# Patient Record
Sex: Female | Born: 1960
Health system: Southern US, Community
[De-identification: ages and names within clinical notes are randomized; demographics above are authoritative.]

## PROBLEM LIST (undated history)

## (undated) DIAGNOSIS — Z789 Other specified health status: Secondary | ICD-10-CM

## (undated) DIAGNOSIS — R7989 Other specified abnormal findings of blood chemistry: Secondary | ICD-10-CM

## (undated) DIAGNOSIS — M869 Osteomyelitis, unspecified: Secondary | ICD-10-CM

## (undated) DIAGNOSIS — I10 Essential (primary) hypertension: Secondary | ICD-10-CM

## (undated) DIAGNOSIS — C541 Malignant neoplasm of endometrium: Secondary | ICD-10-CM

## (undated) DIAGNOSIS — D649 Anemia, unspecified: Secondary | ICD-10-CM

## (undated) DIAGNOSIS — J189 Pneumonia, unspecified organism: Secondary | ICD-10-CM

## (undated) DIAGNOSIS — K219 Gastro-esophageal reflux disease without esophagitis: Secondary | ICD-10-CM

## (undated) DIAGNOSIS — M25539 Pain in unspecified wrist: Secondary | ICD-10-CM

## (undated) DIAGNOSIS — H8110 Benign paroxysmal vertigo, unspecified ear: Secondary | ICD-10-CM

## (undated) DIAGNOSIS — Z923 Personal history of irradiation: Secondary | ICD-10-CM

## (undated) DIAGNOSIS — M25522 Pain in left elbow: Secondary | ICD-10-CM

## (undated) DIAGNOSIS — M25512 Pain in left shoulder: Secondary | ICD-10-CM

## (undated) DIAGNOSIS — J45909 Unspecified asthma, uncomplicated: Secondary | ICD-10-CM

## (undated) HISTORY — PX: ABDOMINAL HYSTERECTOMY: SHX81

## (undated) HISTORY — PX: CARDIAC CATHETERIZATION: SHX172

## (undated) HISTORY — DX: Other specified health status: Z78.9

## (undated) HISTORY — DX: Unspecified asthma, uncomplicated: J45.909

## (undated) HISTORY — PX: TUBAL LIGATION: SHX77

---
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
## 1988-11-28 DIAGNOSIS — E1169 Type 2 diabetes mellitus with other specified complication: Secondary | ICD-10-CM | POA: Insufficient documentation

## 1988-11-28 DIAGNOSIS — E1140 Type 2 diabetes mellitus with diabetic neuropathy, unspecified: Secondary | ICD-10-CM | POA: Insufficient documentation

## 1988-11-28 DIAGNOSIS — E119 Type 2 diabetes mellitus without complications: Secondary | ICD-10-CM | POA: Insufficient documentation

## 1988-11-28 DIAGNOSIS — M869 Osteomyelitis, unspecified: Secondary | ICD-10-CM

## 2008-11-28 DIAGNOSIS — E1159 Type 2 diabetes mellitus with other circulatory complications: Secondary | ICD-10-CM

## 2008-11-28 DIAGNOSIS — I10 Essential (primary) hypertension: Secondary | ICD-10-CM | POA: Insufficient documentation

## 2008-11-28 DIAGNOSIS — I152 Hypertension secondary to endocrine disorders: Secondary | ICD-10-CM | POA: Insufficient documentation

## 2010-09-30 ENCOUNTER — Encounter (INDEPENDENT_AMBULATORY_CARE_PROVIDER_SITE_OTHER): Payer: Self-pay | Admitting: Family Medicine

## 2010-09-30 ENCOUNTER — Inpatient Hospital Stay (HOSPITAL_COMMUNITY)
Admission: EM | Admit: 2010-09-30 | Discharge: 2010-10-04 | Payer: Self-pay | Source: Home / Self Care | Admitting: Emergency Medicine

## 2010-10-01 ENCOUNTER — Ambulatory Visit: Payer: Self-pay | Admitting: Infectious Diseases

## 2010-10-01 LAB — CONVERTED CEMR LAB: Hgb A1c MFr Bld: 12.5 %

## 2010-10-03 LAB — CONVERTED CEMR LAB
BUN: 6 mg/dL
CO2: 27 meq/L
Calcium: 8.8 mg/dL
Chloride: 108 meq/L
Creatinine, Ser: 0.47 mg/dL
Glucose, Bld: 324 mg/dL
Potassium: 4.1 meq/L
Sodium: 140 meq/L

## 2010-10-04 LAB — CONVERTED CEMR LAB
HCT: 34.7 %
Hemoglobin: 11 g/dL
MCHC: 31.7 g/dL
MCV: 91.6 fL
Platelets: 258 10*3/uL
RBC: 3.79 M/uL
RDW: 15.9 %
WBC: 4.1 10*3/uL

## 2010-10-26 ENCOUNTER — Emergency Department (HOSPITAL_COMMUNITY)
Admission: EM | Admit: 2010-10-26 | Discharge: 2010-10-26 | Payer: Self-pay | Source: Home / Self Care | Admitting: Emergency Medicine

## 2010-10-29 ENCOUNTER — Emergency Department (HOSPITAL_COMMUNITY)
Admission: EM | Admit: 2010-10-29 | Discharge: 2010-10-29 | Payer: Self-pay | Source: Home / Self Care | Admitting: Emergency Medicine

## 2010-11-01 ENCOUNTER — Telehealth (INDEPENDENT_AMBULATORY_CARE_PROVIDER_SITE_OTHER): Payer: Self-pay | Admitting: Nurse Practitioner

## 2010-11-01 ENCOUNTER — Ambulatory Visit: Payer: Self-pay | Admitting: Internal Medicine

## 2010-11-01 ENCOUNTER — Encounter (INDEPENDENT_AMBULATORY_CARE_PROVIDER_SITE_OTHER): Payer: Self-pay | Admitting: Nurse Practitioner

## 2010-11-01 DIAGNOSIS — E1140 Type 2 diabetes mellitus with diabetic neuropathy, unspecified: Secondary | ICD-10-CM

## 2010-11-01 DIAGNOSIS — J45909 Unspecified asthma, uncomplicated: Secondary | ICD-10-CM | POA: Insufficient documentation

## 2010-11-01 DIAGNOSIS — L97509 Non-pressure chronic ulcer of other part of unspecified foot with unspecified severity: Secondary | ICD-10-CM | POA: Insufficient documentation

## 2010-11-01 HISTORY — DX: Unspecified asthma, uncomplicated: J45.909

## 2010-11-01 HISTORY — DX: Type 2 diabetes mellitus with diabetic neuropathy, unspecified: E11.40

## 2010-11-01 LAB — CONVERTED CEMR LAB
Bilirubin Urine: NEGATIVE
Blood Glucose, Fingerstick: 284
Glucose, Urine, Semiquant: >=1000
Microalb, Ur: 3.23 mg/dL — ABNORMAL HIGH (ref 0.00–1.89)
Nitrite: NEGATIVE
Protein, U semiquant: NEGATIVE
Specific Gravity, Urine: 1.02
Urobilinogen, UA: 0.2
WBC Urine, dipstick: NEGATIVE
pH: 5

## 2010-11-09 ENCOUNTER — Encounter (HOSPITAL_BASED_OUTPATIENT_CLINIC_OR_DEPARTMENT_OTHER)
Admission: RE | Admit: 2010-11-09 | Discharge: 2010-12-28 | Payer: Self-pay | Source: Home / Self Care | Attending: Internal Medicine | Admitting: Internal Medicine

## 2010-11-17 ENCOUNTER — Encounter (INDEPENDENT_AMBULATORY_CARE_PROVIDER_SITE_OTHER): Payer: Self-pay | Admitting: Nurse Practitioner

## 2010-11-18 ENCOUNTER — Encounter (INDEPENDENT_AMBULATORY_CARE_PROVIDER_SITE_OTHER): Payer: Self-pay | Admitting: Nurse Practitioner

## 2010-12-01 ENCOUNTER — Ambulatory Visit (HOSPITAL_COMMUNITY)
Admission: RE | Admit: 2010-12-01 | Discharge: 2010-12-01 | Payer: Self-pay | Source: Home / Self Care | Attending: Internal Medicine | Admitting: Internal Medicine

## 2010-12-02 ENCOUNTER — Telehealth (INDEPENDENT_AMBULATORY_CARE_PROVIDER_SITE_OTHER): Payer: Self-pay | Admitting: Nurse Practitioner

## 2010-12-03 ENCOUNTER — Ambulatory Visit
Admission: RE | Admit: 2010-12-03 | Discharge: 2010-12-03 | Payer: Self-pay | Source: Home / Self Care | Attending: Nurse Practitioner | Admitting: Nurse Practitioner

## 2010-12-03 ENCOUNTER — Encounter (INDEPENDENT_AMBULATORY_CARE_PROVIDER_SITE_OTHER): Payer: Self-pay | Admitting: Nurse Practitioner

## 2010-12-07 ENCOUNTER — Ambulatory Visit: Admit: 2010-12-07 | Payer: Self-pay | Admitting: Nurse Practitioner

## 2010-12-13 LAB — GLUCOSE, CAPILLARY: Glucose-Capillary: 184 mg/dL — ABNORMAL HIGH (ref 70–99)

## 2010-12-15 LAB — GLUCOSE, CAPILLARY
Glucose-Capillary: 155 mg/dL — ABNORMAL HIGH (ref 70–99)
Glucose-Capillary: 124 mg/dL — ABNORMAL HIGH (ref 70–99)
Glucose-Capillary: 126 mg/dL — ABNORMAL HIGH (ref 70–99)
Glucose-Capillary: 141 mg/dL — ABNORMAL HIGH (ref 70–99)
Glucose-Capillary: 121 mg/dL — ABNORMAL HIGH (ref 70–99)
Glucose-Capillary: 113 mg/dL — ABNORMAL HIGH (ref 70–99)

## 2010-12-20 LAB — GLUCOSE, CAPILLARY
Glucose-Capillary: 240 mg/dL — ABNORMAL HIGH (ref 70–99)
Glucose-Capillary: 154 mg/dL — ABNORMAL HIGH (ref 70–99)
Glucose-Capillary: 324 mg/dL — ABNORMAL HIGH (ref 70–99)
Glucose-Capillary: 194 mg/dL — ABNORMAL HIGH (ref 70–99)
Glucose-Capillary: 229 mg/dL — ABNORMAL HIGH (ref 70–99)
Glucose-Capillary: 186 mg/dL — ABNORMAL HIGH (ref 70–99)

## 2010-12-21 LAB — GLUCOSE, CAPILLARY
Glucose-Capillary: 220 mg/dL — ABNORMAL HIGH (ref 70–99)
Glucose-Capillary: 128 mg/dL — ABNORMAL HIGH (ref 70–99)

## 2010-12-22 LAB — GLUCOSE, CAPILLARY
Glucose-Capillary: 147 mg/dL — ABNORMAL HIGH (ref 70–99)
Glucose-Capillary: 151 mg/dL — ABNORMAL HIGH (ref 70–99)

## 2010-12-23 LAB — GLUCOSE, CAPILLARY
Glucose-Capillary: 177 mg/dL — ABNORMAL HIGH (ref 70–99)
Glucose-Capillary: 90 mg/dL (ref 70–99)

## 2010-12-28 LAB — GLUCOSE, CAPILLARY
Glucose-Capillary: 49 mg/dL — ABNORMAL LOW (ref 70–99)
Glucose-Capillary: 67 mg/dL — ABNORMAL LOW (ref 70–99)

## 2010-12-28 NOTE — Progress Notes (Signed)
Summary: Wound Center Referral  Phone Note Outgoing Call   Summary of Call: refer to pt wound clinic reason: left great toe ulcer s/p hospitalization  Hx of diabetes Initial call taken by: Lehman Prom FNP,  November 01, 2010 4:37 PM  Follow-up for Phone Call        PT HAVE AN APPT WOUND CARE CENTER  11-09-10 @ 9AM PT AWARE OF HER APPT  Follow-up by: Cheryll Dessert,  November 03, 2010 11:23 AM

## 2010-12-28 NOTE — Assessment & Plan Note (Signed)
Summary: NEW - Hospital F/u   Vital Signs:  Patient profile:   50 year old female LMP:     10/30/2010 Height:      73 inches Weight:      248.9 pounds BMI:     32.96 BSA:     2.36 Temp:     98.1 degrees F oral Pulse rate:   88 / minute Pulse rhythm:   regular Resp:     20 per minute BP sitting:   100 / 60  (left arm) Cuff size:   regular  Vitals Entered By: Levon Hedger (November 01, 2010 3:25 PM)  Nutrition Counseling: Patient's BMI is greater than 25 and therefore counseled on weight management options. CC: follow-up visit [facility], Hypertension Management Is Patient Diabetic? Yes Pain Assessment Patient in pain? no      CBG Result 284 CBG Device ID B  Does patient need assistance? Functional Status Self care Ambulation Normal LMP (date): 10/30/2010     Enter LMP: 10/30/2010   CC:  follow-up visit [facility] and Hypertension Management.  History of Present Illness:  Pt into the office to establish care. Pt is from [place].  She has been in [place] for 7 months.   NO PCP since she has been in [place].    Pt was recently in the hospital for a toe infection. She was hospitalized from 09/30/2010 to 10/04/2010 (full d/c reviewed)  Diabetic left foot great toe infection with wound culture results showing moderate gram negative rods Left great toe - dressing changes daily per pt as per directions given in hospital. MRI of left foot shows forefoot cellulitis with suspected subungual infection in the great toe.  In addition there was superficial blister dorsal lateral to the interphalangeal joints of the great toe which may be infected.  No deep fluid identified. pt was followed by Dr. Ninetta Lights and Orvan Falconer of Infections disease and she was also consulted by Dr. Annell Greening of orthopedics.  1 week following her hospitalization she started with boils. She went to the ER and boil on buttock was lanced.  She was started with doxycycline and pain medications.  Areas have started  to improve  Diabetes - Pt completed her supply in 07/2010 and since she did not establish with a PCP she was not able to afford nor get Rx for the medications Novolin 70/30 - 45 in A.M.and 30 units in PM Lantus - 35 at night Metformin 1000mg  by mouth two times a day  Even after pt was d/c from the hospital she was not able to get the refills on insulin due to cost.  Hypertension History:      She denies headache, chest pain, and palpitations.        Positive major cardiovascular risk factors include diabetes and hypertension.  Negative major cardiovascular risk factors include female age less than 60 years old and non-tobacco-user status.     Habits & Providers  Alcohol-Tobacco-Diet     Alcohol drinks/day: 0     Tobacco Status: never  Exercise-Depression-Behavior     Drug Use: never  Medications Prior to Update: 1)  None  Allergies (verified): 1)  ! Penicillin 2)  ! Ibuprofen  Past History:  Past Surgical History: Caesarean section x 4  Family History: mother - htn father - diabetes, CAD, CVA, alcholism (deceased) 2 brothers (1 younger brother with diabetes) 2 sisters - (1 younger sister with htn)  Social History: G5P4 4 C-section separated Tobacco - none ETOH -  none Drug - noneSmoking Status:  never Drug Use:  never  Review of Systems CV:  Denies chest pain or discomfort. Resp:  Denies cough. GI:  Denies abdominal pain, nausea, and vomiting. Derm:  Complains of poor wound healing; left great toe.  Physical Exam  General:  alert.   Head:  normocephalic.   Lungs:  normal breath sounds.   Heart:  normal rate and regular rhythm.   Abdomen:  normal bowel sounds.   Msk:  left great toe - missing toenail granulation tissue in nail bed  yellow, thick crusted skin at head of toe Neurologic:  alert & oriented X3.    Diabetes Management Exam:    Foot Exam (with socks and/or shoes not present):       Sensory-Monofilament:          Left foot: normal           Right foot: normal  Diabetic Foot Exam Foot Inspection Is there a history of a foot ulcer?              Yes Is there a foot ulcer now?              Yes Can the patient see the bottom of their feet?          No Are the shoes appropriate in style and fit?          Yes Is there swelling or an abnormal foot shape?          No Are the toenails thick?                Yes Are the toenails ingrown?              No Is there heavy callous build-up?              Yes Is there pain in the calf muscle (Intermittent claudication) when walking?    NoIs there a claw toe deformity?              Yes Is there elevated skin temperature?            No Is there limited ankle dorsiflexion?            No Is there foot or ankle muscle weakness?            No  Diabetic Foot Care Education Patient educated on appropriate care of diabetic feet.  Pulse Check          Right Foot          Left Foot Dorsalis Pedis:        normal            normal  High Risk Feet? Yes   10-g (5.07) Semmes-Weinstein Monofilament Test Performed by: Levon Hedger          Right Foot          Left Foot Visual Inspection                 Impression & Recommendations:  Problem # 1:  DIABETES MELLITUS, TYPE II (ICD-250.00) will restart insulin Hgba1c = 12.5 while in the hospital Her updated medication list for this problem includes:    Benazepril Hcl 10 Mg Tabs (Benazepril hcl) ..... One tablet by mouth daily for blood pressure    Novolog Mix 70/30 70-30 % Susp (Insulin aspart prot & aspart) .Marland KitchenMarland KitchenMarland KitchenMarland Kitchen 45 units subcutaneously in the morning and 30 unis subcutaneously in the afternoon for  diabetes    Lantus 100 Unit/ml Soln (Insulin glargine) .Marland KitchenMarland KitchenMarland KitchenMarland Kitchen 35 units subcutaneously nightly for diabetes    Metformin Hcl 1000 Mg Tabs (Metformin hcl) ..... One tablet by mouth two times a day for diabetes  Orders: Capillary Blood Glucose/CBG (04540) UA Dipstick w/o Micro (manual) (98119) T-Urine Microalbumin w/creat. ratio  [phone])  Problem # 2:  HYPERTENSION, BENIGN ESSENTIAL (ICD-401.1) BP stable continue current meds Her updated medication list for this problem includes:    Benazepril Hcl 10 Mg Tabs (Benazepril hcl) ..... 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 One tablet by mouth daily for blood pressure    Novolog Mix 70/30 70-30 % Susp (Insulin aspart prot & aspart) .Marland KitchenMarland KitchenMarland KitchenMarland Kitchen 45 units subcutaneously in the morning and 30 unis subcutaneously in the afternoon for diabetes    Lantus 100 Unit/ml Soln (Insulin glargine) .Marland KitchenMarland KitchenMarland KitchenMarland Kitchen 35 units subcutaneously nightly for diabetes    Metformin Hcl 1000 Mg Tabs (Metformin hcl) ..... One tablet by mouth two times a day for diabetes  Complete Medication List: 1)  Benazepril Hcl 10 Mg Tabs (Benazepril hcl) .... One tablet by mouth daily for blood pressure 2)  Hydrocodone-acetaminophen 5-325 Mg Tabs (Hydrocodone-acetaminophen) 3)  Doxycycline Hyclate 100 Mg Caps (Doxycycline hyclate) .... Take one capsule by mouth twice daily for 7 days  joseph garretson, dds 4)  Novolog Mix 70/30 70-30 % Susp (Insulin aspart prot & aspart) .... 45 units subcutaneously in the morning and 30 unis subcutaneously in the afternoon for diabetes 5)  Lantus 100 Unit/ml Soln (Insulin glargine) .... 35 units subcutaneously nightly for diabetes 6)  Metformin Hcl 1000 Mg Tabs (Metformin hcl) .... One tablet by mouth two times a day for diabetes  Hypertension Assessment/Plan:      The patient's hypertensive risk group is category C: Target organ damage and/or diabetes.  Today's blood pressure is 100/60.  Her blood  pressure goal is < 130/80.  Patient Instructions: 1)  Sign a release to get records from [facility] in [place], [place] 2)  You will be referred to the wound clinic for additional follow up on left great toe wound.  You will be notified of the time/date of the appointment 3)  Keep doing dressing changes as ordered by hospital. 4)  You should get your medications filled at [facility] pharmacy.  Prescriptions will be faxed to pharmacy.Check there on Wednesday to see if they are available. 5)  Follow up win 6 weeks with n.martin,fnp for diabetes. Prescriptions: METFORMIN HCL 1000 MG TABS (METFORMIN HCL) One tablet by mouth two times a day for diabetes  #60 x 3   Entered and Authorized by:   Lehman Prom FNP   Signed by:   Lehman Prom FNP on 11/01/2010   Method used:   Faxed to ...       [facility] - Pharmac (retail)       [address] [place], [place]  [address]       Ph: [phone]       Fax: [phone]   RxID:   [id number] LANTUS 100 UNIT/ML SOLN (INSULIN  GLARGINE) 35 units subcutaneously nightly for diabetes  #1 month qs x 3   Entered and Authorized by:   Lehman Prom FNP   Signed by:   Lehman Prom FNP on 11/01/2010   Method used:   Faxed to ...       [facility] - Pharmac (retail)       [address] [place], [place]  [address]       Ph: [phone]       Fax: [phone]   RxID:   [id number] NOVOLOG MIX 70/30 70-30 % SUSP (INSULIN ASPART PROT & ASPART) 45 units subcutaneously in the morning and 30 unis subcutaneously in the afternoon for diabetes  #1 month qs x 3   Entered and Authorized by:   Lehman Prom FNP   Signed by:   Lehman Prom FNP on 11/01/2010   Method used:   Faxed to ...       [facility] - Pharmac (retail)       [address] [place], [place]  [address]       Ph: [phone]       Fax: [phone]    RxID:   [id number] BENAZEPRIL HCL 10 MG TABS (BENAZEPRIL HCL) One tablet by mouth daily for blood pressure  #30 x 3   Entered and Authorized by:   Lehman Prom FNP   Signed by:   Lehman Prom FNP on 11/01/2010   Method used:   Faxed to ...       [facility] - Pharmac (retail)       [address] [place], [place]  [address]       Ph: [phone]       Fax: [phone]   RxID:   [id number]    Orders Added: 1)  Capillary Blood Glucose/CBG [82948] 2)  New Patient Level III [99203] 3)  UA Dipstick w/o Micro (manual) [81002] 4)  T-Urine Microalbumin w/creat. ratio [[id number]] 5)  Wound Care Center Referral [Wound Care]           Diabetic Foot Exam Diabetic Foot Care Education :Patient educated on appropriate care of diabetic feet.  Pulse Check          Right Foot          Left Foot Dorsalis Pedis:        normal            normal  High Risk Feet? Yes   10-g (5.07) Semmes-Weinstein Monofilament Test Performed by: Levon Hedger          Right Foot          Left Foot Visual Inspection               Test Control      normal         normal Site 1         abnormal         normal Site 2         normal         abnormal Site 3         normal         normal Site 4         normal         normal Site 5         normal  normal Site 6         normal         normal Site 7         normal         normal Site 8         normal         normal Site 9         normal         normal Site 10         normal         normal  Impression      normal         normal  Laboratory Results   Urine Tests  Date/Time Received: November 01, 2010 5:10 PM   Routine Urinalysis   Color: lt. yellow Appearance: Clear Glucose: >=1000   (Normal Range: Negative) Bilirubin: negative   (Normal Range: Negative) Ketone: moderate (40)   (Normal Range: Negative) Spec. Gravity: 1.020   (Normal Range: 1.003-1.035) Blood: large   (Normal Range:  Negative) pH: 5.0   (Normal Range: 5.0-8.0) Protein: negative   (Normal Range: Negative) Urobilinogen: 0.2   (Normal Range: 0-1) Nitrite: negative   (Normal Range: Negative) Leukocyte Esterace: negative   (Normal Range: Negative)     Blood Tests     CBG Random:: 284

## 2010-12-30 NOTE — Assessment & Plan Note (Signed)
Summary: Diabetes   Vital Signs:  Patient profile:   50 year old female LMP:     10/2010 Weight:      259.1 pounds BMI:     34.31 Temp:     97.4 degrees F oral Pulse rate:   100 / minute Pulse rhythm:   regular Resp:     20 per minute BP sitting:   120 / 70  (left arm) Cuff size:   regular  Vitals Entered By: Levon Hedger (December 03, 2010 3:17 PM)  Nutrition Counseling: Patient's BMI is greater than 25 and therefore counseled on weight management options. CC: x 2-3 weeks hands and feet have been swelling, Hypertension Management Is Patient Diabetic? Yes Pain Assessment Patient in pain? no      CBG Result 40  Does patient need assistance? Functional Status Self care Ambulation Normal LMP (date): 10/2010     Enter LMP: 10/2010   CC:  x 2-3 weeks hands and feet have been swelling and Hypertension Management.  History of Present Illness:  Pt has been to the wound clinic as ordered She will start the hyperbaric chamber on next week. Daily - 2 hours for 6 weeks.  Swelling in feet - noticed over the past 2-3 weeks She had a seasonal job at [employer] that lasted for 5 weeks some swelling of feet even upon waking and that progressed as the day went on Denies any changes in diet Pt admits that she found some neurontin and started back taking it at dosage of 900mg  in the morning and 600mg  in the afternoon.  She has not taken it the past 2-3 days.  Diabetes Management History:      The patient is a 50 years old female who comes in for evaluation of DM Type 2.  She has not been enrolled in the "Diabetic Education Program".  She states understanding of dietary principles and is following her diet appropriately.  No sensory loss is reported.  Self foot exams are not being performed.  She is checking home blood sugars.  She says that she is not exercising regularly.        Hypoglycemic symptoms are not occurring.  No hyperglycemic symptoms are reported.        No changes have  been made to her treatment plan since last visit.    Hypertension History:      She denies headache, chest pain, and palpitations.  She notes no problems with any antihypertensive medication side effects.        Positive major cardiovascular risk factors include diabetes and hypertension.  Negative major cardiovascular risk factors include female age less than 26 years old and non-tobacco-user status.     Habits & Providers  Exercise-Depression-Behavior     Does Patient Exercise: no  Allergies (verified): 1)  ! Penicillin 2)  ! Ibuprofen 3)  ! Aspirin  Social History: Does Patient Exercise:  no  Review of Systems CV:  Complains of near fainting, swelling of feet, and swelling of hands; denies chest pain or discomfort. Resp:  Denies cough and shortness of breath. GI:  Denies abdominal pain, vomiting, and vomiting blood. Derm:  Complains of poor wound healing; pt has been referred to the wound clinic.  Physical Exam  General:  alert.   Head:  normocephalic.   Eyes:  glasses Ears:  ear piercing(s) noted.   Lungs:  normal breath sounds.   Heart:  normal rate and regular rhythm.   Msk:  up to the  exam table Neurologic:  alert & oriented X3.     Impression & Recommendations:  Problem # 1:  DIABETIC FOOT ULCER, TOE (ICD-707.15) pt to continue going to wound care as ordered  Problem # 2:  HYPERTENSION, BENIGN ESSENTIAL (ICD-401.1) BP is doing well.  Her updated medication list for this problem includes:    Benazepril Hcl 10 Mg Tabs (Benazepril hcl) ..... One tablet by mouth daily for blood pressure  Problem # 3:  DIABETES MELLITUS, TYPE II (ICD-250.00) BS 40 in the office. Glucose tabs given x 2 Her updated medication list for this problem includes:    Benazepril Hcl 10 Mg Tabs (Benazepril hcl) ..... One tablet by mouth daily for blood pressure    Novolog Mix 70/30 70-30 % Susp (Insulin aspart prot & aspart) .Marland KitchenMarland KitchenMarland KitchenMarland Kitchen 45 units subcutaneously in the morning and 30 unis  subcutaneously in the afternoon for diabetes    Lantus 100 Unit/ml Soln (Insulin glargine) .Marland KitchenMarland KitchenMarland KitchenMarland Kitchen 35 units subcutaneously nightly for diabetes    Metformin Hcl 1000 Mg Tabs (Metformin hcl) ..... One tablet by mouth two times a day for diabetes  Orders: Capillary Blood Glucose/CBG (59563)  Problem # 4:  ASTHMA (ICD-493.90) stable  Complete Medication List: 1)  Benazepril Hcl 10 Mg Tabs (Benazepril hcl) .... One tablet by mouth daily for blood pressure 2)  Hydrocodone-acetaminophen 5-325 Mg Tabs (Hydrocodone-acetaminophen) 3)  Doxycycline Hyclate 100 Mg Caps (Doxycycline hyclate) .... Take one capsule by mouth twice daily for 7 days  joseph garretson, dds 4)  Novolog Mix 70/30 70-30 % Susp (Insulin aspart prot & aspart) .... 45 units subcutaneously in the morning and 30 unis subcutaneously in the afternoon for diabetes 5)  Lantus 100 Unit/ml Soln (Insulin glargine) .... 35 units subcutaneously nightly for diabetes 6)  Metformin Hcl 1000 Mg Tabs (Metformin hcl) .... One tablet by mouth two times a day for diabetes 7)  Tramadol Hcl 50 Mg Tabs (Tramadol hcl) .... One tablet by mouth four times per day  Diabetes Management Assessment/Plan:      Her blood pressure goal is < 130/80.    Hypertension Assessment/Plan:      The patient's hypertensive risk group is category C: Target organ damage and/or diabetes.  Today's blood pressure is 120/70.  Her blood pressure goal is < 130/80.  Patient Instructions: 1)  Swelling was most likely coming from the neurontin.  The swelling should improve. 2)  Remember to avoid salt in your diet. 3)  Elevate legs while sitting at home. 4)  Cancel appointment in February 5)  Schedule next appoitment for a complete physical exam 6)  No after midnight before this appointment. 7)  You will tdap, mammogram, PAP, vision, u/a.   Orders Added: 1)  Est. Patient Level III [87564] 2)  Capillary Blood Glucose/CBG [82948]    Laboratory Results   Blood Tests     Date/Time Received: December 03, 2010 4:15 PM   CBG Random:: 40mg /dL

## 2010-12-30 NOTE — Letter (Signed)
Summary: PT INFORMATION SHEET  PT INFORMATION SHEET   Imported By: Arta Bruce 11/10/2010 15:15:00  _____________________________________________________________________  External Attachment:    Type:   Image     Comment:   External Document

## 2010-12-30 NOTE — Letter (Signed)
Summary: WOUND CARE & [facility]  WOUND CARE & [facility]   Imported By: Arta Bruce 11/25/2010 09:36:06  _____________________________________________________________________  External Attachment:    Type:   Image     Comment:   External Document

## 2010-12-30 NOTE — Progress Notes (Signed)
Summary: Swelling  Phone Note Call from Patient   Summary of Call: pt says she has been having swelling in her hands and feet.... pt says the swelling went down but she is worried about the situtation and since we are rescheduling the appt on 12-07-10 she is unable to bring the situation to provider at visit... pt says she start new bp med around Dec. 25...  pt is upset that we are rescheduling because she is concerned about the swelling and she just started working. Initial call taken by: [place] Shannon,  December 02, 2010 2:55 PM  Follow-up for Phone Call        Did she to go the wound clinic as ordered? opening today at 2:45 - see if pt will come today. Follow-up by: Lehman Prom FNP,  December 03, 2010 8:18 AM  Additional Follow-up for Phone Call Additional follow up Details #1::        patient is scheduled to come in today. Additional Follow-up by: Leodis Rains,  December 03, 2010 8:29 AM

## 2010-12-30 NOTE — Miscellaneous (Signed)
Summary: Tramadol Rx  Clinical Lists Changes  Rx ordered by the wound clinic. Rx to be filled at [facility] pharmacy  Medications: Added new medication of TRAMADOL HCL 50 MG TABS (TRAMADOL HCL) One tablet by mouth four times per day - Signed Rx of TRAMADOL HCL 50 MG TABS (TRAMADOL HCL) One tablet by mouth four times per day;  #120 x 0;  Signed;  Entered by: Lehman Prom FNP;  Authorized by: Lehman Prom FNP;  Method used: Historical    Prescriptions: TRAMADOL HCL 50 MG TABS (TRAMADOL HCL) One tablet by mouth four times per day  #120 x 0   Entered and Authorized by:   Lehman Prom FNP   Signed by:   Lehman Prom FNP on 11/17/2010   Method used:   Historical   RxID:   [id number]

## 2010-12-30 NOTE — Letter (Signed)
Summary: [facility]  [facility]   Imported By: Arta Bruce 12/10/2010 16:06:49  _____________________________________________________________________  External Attachment:    Type:   Image     Comment:   External Document

## 2010-12-30 NOTE — Consult Note (Signed)
Summary: Consultation Report//[place]  Consultation Report//[place]   Imported By: Arta Bruce 11/25/2010 09:33:52  _____________________________________________________________________  External Attachment:    Type:   Image     Comment:   External Document

## 2011-01-03 ENCOUNTER — Encounter (HOSPITAL_BASED_OUTPATIENT_CLINIC_OR_DEPARTMENT_OTHER): Payer: Self-pay

## 2011-01-09 ENCOUNTER — Emergency Department (HOSPITAL_COMMUNITY): Payer: Self-pay

## 2011-01-09 ENCOUNTER — Observation Stay (HOSPITAL_COMMUNITY)
Admission: EM | Admit: 2011-01-09 | Discharge: 2011-01-10 | Disposition: A | Discharge status: Home / Self Care | Payer: Self-pay | Attending: Family Medicine | Admitting: Family Medicine

## 2011-01-09 DIAGNOSIS — Z794 Long term (current) use of insulin: Secondary | ICD-10-CM | POA: Insufficient documentation

## 2011-01-09 DIAGNOSIS — I10 Essential (primary) hypertension: Secondary | ICD-10-CM | POA: Insufficient documentation

## 2011-01-09 DIAGNOSIS — L97509 Non-pressure chronic ulcer of other part of unspecified foot with unspecified severity: Secondary | ICD-10-CM | POA: Insufficient documentation

## 2011-01-09 DIAGNOSIS — R0789 Other chest pain: Principal | ICD-10-CM | POA: Insufficient documentation

## 2011-01-09 DIAGNOSIS — E119 Type 2 diabetes mellitus without complications: Secondary | ICD-10-CM | POA: Insufficient documentation

## 2011-01-09 DIAGNOSIS — R112 Nausea with vomiting, unspecified: Secondary | ICD-10-CM | POA: Insufficient documentation

## 2011-01-09 DIAGNOSIS — Z8249 Family history of ischemic heart disease and other diseases of the circulatory system: Secondary | ICD-10-CM | POA: Insufficient documentation

## 2011-01-09 DIAGNOSIS — K219 Gastro-esophageal reflux disease without esophagitis: Secondary | ICD-10-CM | POA: Insufficient documentation

## 2011-01-09 DIAGNOSIS — R0609 Other forms of dyspnea: Secondary | ICD-10-CM | POA: Insufficient documentation

## 2011-01-09 DIAGNOSIS — R61 Generalized hyperhidrosis: Secondary | ICD-10-CM | POA: Insufficient documentation

## 2011-01-09 DIAGNOSIS — R0989 Other specified symptoms and signs involving the circulatory and respiratory systems: Secondary | ICD-10-CM | POA: Insufficient documentation

## 2011-01-09 LAB — COMPREHENSIVE METABOLIC PANEL
Alkaline Phosphatase: 60 U/L (ref 39–117)
BUN: 12 mg/dL (ref 6–23)
Chloride: 106 mEq/L (ref 96–112)
Creatinine, Ser: 0.57 mg/dL (ref 0.4–1.2)
GFR calc non Af Amer: >60 mL/min (ref >60)
Glucose, Bld: 155 mg/dL — ABNORMAL HIGH (ref 70–99)
Potassium: 4 mEq/L (ref 3.5–5.1)
Total Bilirubin: 0.3 mg/dL (ref 0.3–1.2)

## 2011-01-09 LAB — POCT CARDIAC MARKERS
CKMB, poc: 5.1 ng/mL (ref 1.0–8.0)
Myoglobin, poc: 78.4 ng/mL (ref 12–200)

## 2011-01-09 LAB — CBC
Hemoglobin: 11.5 g/dL — ABNORMAL LOW (ref 12.0–15.0)
MCH: 29.6 pg (ref 26.0–34.0)
MCHC: 32.5 g/dL (ref 30.0–36.0)
MCV: 91 fL (ref 78.0–100.0)
Platelets: 259 10*3/uL (ref 150–400)
RBC: 3.89 MIL/uL (ref 3.87–5.11)

## 2011-01-09 LAB — APTT: aPTT: 34 seconds (ref 24–37)

## 2011-01-10 LAB — LIPID PANEL
Cholesterol: 204 mg/dL — ABNORMAL HIGH (ref 0–200)
LDL Cholesterol: 114 mg/dL — ABNORMAL HIGH (ref 0–99)
Triglycerides: 48 mg/dL (ref <150)

## 2011-01-10 LAB — BASIC METABOLIC PANEL
Calcium: 8.9 mg/dL (ref 8.4–10.5)
GFR calc Af Amer: >60 mL/min (ref >60)
GFR calc non Af Amer: >60 mL/min (ref >60)
Glucose, Bld: 160 mg/dL — ABNORMAL HIGH (ref 70–99)
Potassium: 4.1 mEq/L (ref 3.5–5.1)
Sodium: 141 mEq/L (ref 135–145)

## 2011-01-10 LAB — CARDIAC PANEL(CRET KIN+CKTOT+MB+TROPI)
Relative Index: 2.3 (ref 0.0–2.5)
Troponin I: <0.01 ng/mL (ref 0.00–0.06)

## 2011-01-10 LAB — GLUCOSE, CAPILLARY
Glucose-Capillary: 150 mg/dL — ABNORMAL HIGH (ref 70–99)
Glucose-Capillary: 250 mg/dL — ABNORMAL HIGH (ref 70–99)

## 2011-01-10 LAB — CK TOTAL AND CKMB (NOT AT ARMC)
CK, MB: 5.2 ng/mL — ABNORMAL HIGH (ref 0.3–4.0)
Relative Index: 2.4 (ref 0.0–2.5)

## 2011-01-10 LAB — TROPONIN I: Troponin I: <0.01 ng/mL (ref 0.00–0.06)

## 2011-01-11 NOTE — Discharge Summary (Signed)
[name], [name]              ACCOUNT NO.:  [id number]  MEDICAL RECORD NO.:  [id number]           PATIENT TYPE:  I  LOCATION:  2031                         FACILITY:  [facility]  PHYSICIAN:  Tarry Kos, MD       DATE OF BIRTH:  January 23, 1961  DATE OF ADMISSION:  01/09/2011 DATE OF DISCHARGE:  01/10/2011                              DISCHARGE SUMMARY   DISCHARGE DIAGNOSES: 1. Atypical chest pain. 2. Gastroesophageal reflux disease. 3. Diabetes.  SUMMARY OF HOSPITAL COURSE:  [name] is a 50 year old female who presented to the emergency room last night with atypical chest pain. She was admitted for serial cardiac enzymes, which were all negative. She had no EKG changes.  Her chest x-ray was normal.  White count was normal.  Electrolytes normal.  BNP was less than 30.  She subsequently had an echo done today, those results are pending.  She has been afebrile.  PHYSICAL EXAMINATION:  VITAL SIGNS:  Her vital signs have been stable. 100% O2 sats on room air. GENERAL:  She is alert and oriented x4.  No apparent stress. Cooperative, friendly. CARDIAC:  Regular rate and rhythm without murmurs or gallops. CHEST:  Clear to auscultation bilaterally.  No wheeze, rhonchi, or rales. ABDOMEN:  Soft, nontender, nondistended.  Positive bowel sounds. No hepatosplenomegaly. EXTREMITIES:  No clubbing, cyanosis, or edema. PSYCH:  Normal affect. NEURO:  No focal neurologic deficits. SKIN:  No rashes.  The patient to be discharged home today to follow up with her primary care physician in approximately a week.  It sounds like her symptoms are due to GI related issues.  She ate some very spicy food at which point her heartburn started.  She normally takes ranitidine over the counter, but that does not seem to help as much as Prilosec over the counter. She has no health insurance.  I have instructed her to continue taking Prilosec over the counter.  [facility], and I  have encouraged her to discuss this with her primary care physician, may be they have Protonix samples for her or can arrange for her to get Protonix paid for.  I would recommend for her to be placed on aspirin due to her high risk factors of diabetes and hypertension.  However she is allergic to that, and I did not place her on Plavix due to cost issues.  She had a cardiac cath in 2005, which was normal.  She will need further outpatient cardiac stress testing also for full workup. She will be discharged home to follow up with primary care physician. She already has a scheduled appointment with them in the next month. Again, we would readdress her GI issues and go from there.  She is to resume her home medications as previously prescribed prior to this admission.  Again, I did not add any antiplatelet treatment due to cost and her allergy to aspirin.          ______________________________ Tarry Kos, MD     RD/MEDQ  D:  01/10/2011  T:  01/11/2011  Job:  [id number]  Electronically Signed by Eldridge Dace MD  on 01/11/2011 01:33:56 PM

## 2011-01-18 NOTE — H&P (Signed)
[name], [name]              ACCOUNT NO.:  [id number]  MEDICAL RECORD NO.:  [id number]           PATIENT TYPE:  E  LOCATION:  [facility]                         FACILITY:  [facility]  PHYSICIAN:  Houston Siren, MD           DATE OF BIRTH:  November 04, 1961  DATE OF ADMISSION:  01/09/2011 DATE OF DISCHARGE:                             HISTORY & PHYSICAL   PRIMARY CARE PHYSICIAN:  Mr. Daphine Deutscher, NP, at [facility].  CHIEF COMPLAINT:  Chest pain and dyspnea on exertion for over 2 days.  HISTORY OF PRESENT ILLNESS:  The patient is a 50 year old female who presents to the emergency room with complaint of left-sided chest pain for a duration of approximately 2 days.  Chest pain started when the patient was in the grocery store.  She reports that the chest pain is sharp and intermittent.  It is located on her left side and radiated underneath her left breast and down her left arm.  It precipitates a tingling sensation in her arms.  Chest pain is intermittent.  She describes some pressure like sensation.  The patient also reports mild dyspnea on exertion.  She states that she walks up a hill to the bus stop daily.  She states this normally does not cause any shortness of breath.  However, for the past few days, this has become significantly more difficult.  She denies any significant weight gain.  She denies any edema in her extremities.  She does report some swelling in her hands.  She states that this occurs around her menstrual cycle.  The patient does have a history of chest pain and admission secondary to chest pain.  However, I do not see these reviewing her records here at [facility].  The patient states that in 2005, she had a cardiac cath which was normal.  The patient has a significant past medical history of hypertension, diabetes, and GERD. The patient has extensive family history of cardiovascular disease.  She is a diabetic and requires insulin.  She is also being actively treated by [facility] for a left great toe ulcer.  She has been receiving hyperbaric treatment which ended approximately 1 week ago. However, she is getting dressing changes by [facility] for this toe ulcer.  The patient does not smoke nor drink not use any other drugs. The patient denies any abdominal discomfort, dysuria, or change in stools.  She denies any headache or visual changes.  She denies any cough or flu-like symptoms.  PAST MEDICAL HISTORY: 1. Hypertension. 2. Insulin-dependent diabetes. 3. GERD. 4. History of cardiac catheterization in 2005.  SURGICAL HISTORY: 1. Cesarean section. 2. Cardiac catheterization in 2005.  FAMILY HISTORY:  The patient's father had his first heart attack approximately at age 46 and died from cardiovascular disease at age 28 from a second heart attack.  The patient's mother had hypertension.  The patient's grandmother is deceased as well secondary to CHF and coronary artery disease.  SOCIAL HISTORY:  The patient lives at home with her mother and son.  The patient is currently unemployed.  The patient denies any tobacco, alcohol, or  illicit drug use.  ALLERGIES:  The patient reports a rash as a child to PENICILLIN.  The patient has an allergy to ASPIRIN and IBUPROFEN, she states that this causes cramping in her stomach, nausea, and vomiting.  HOME MEDICATIONS:  Please see medication reconciliation per pharmacy.  REVIEW OF SYSTEMS:  GENERAL:  The patient denies any weight change, fatigue, or weakness.  SKIN:  The patient denies any changes in skin or hair.  She does have a left great toe ulcer around the nailbed of her left great toe.  HEENT: The patient denies any headaches or visual changes.  Denies any tinnitus, rhinorrhea, bleeding gums, or hoarseness. CARDIAC:  The patient has a history of hypertension, reports some angina.  Denies any murmurs, palpitations, orthopnea, PND, or edema. RESPIRATORY:  The patient denies shortness of breath, wheeze, cough,  or sputum.  GI:  The patient denies change in appetite, nausea, vomiting. GENITOURINARY:  The patient denies any frequency, hesitancy, polyuria, or dysuria.  VASCULAR:  The patient denies any leg edema or claudication.  MUSCULOSKELETAL:  The patient denies muscle weakness, pain, or joint stiffness.  NEUROLOGIC:  The patient does report some tingling in the left arm during episodes of chest pain.  Otherwise, denies any loss of sensation, numbness, or tingling.  HEMATOLOGIC:  The patient denies anemia or easy bruising.  ENDOCRINE:  The patient denies heat or cold intolerance.  PSYCHIATRIC:  The patient denies any changes in mood, denies history of anxiety or depression.  PHYSICAL EXAMINATION:  GENERAL:  The patient is a pleasant 50 year old African American obese female who is resting comfortably in her bed, in no apparent distress. VITAL SIGNS:  Blood pressure 155/76, heart rate 83, normal sinus rhythm, respirations were 16 and nonlabored, temperature 97.8 degrees taken orally. SKIN:  Appears to be intact.  The patient does have a left great toe ulcer which is weeping.  Dressing was removed by emergency department provider.  She does have socks that has been placed over her foot at this time.  Otherwise, there are no skin changes. HEENT:  Head is normocephalic, atraumatic.  Pupils equal, round, and reactive to light.  Ears are normal.  Nose normal.  Trachea midline. NECK:  Supple. CARDIOVASCULAR:  Regular rate and rhythm.  Normal S1 and S2.  No murmurs, rubs, or gallops. LUNGS:  Clear to auscultation bilaterally.  No wheezes, crackles, or rhonchi. ABDOMEN:  Obese.  Normal bowel sounds.  Nontender. GU:  Deferred. MUSCULOSKELETAL:  There is no muscle atrophy appreciated.  The patient has full range of motion of upper and lower extremities. LYMPHATICS:  There was no cervical, axillary, or inguinal adenopathy. NEUROLOGIC:  The patient was alert and oriented x3.  Cranial nerves  II through XII grossly intact.  ED COURSE:  LABS:  Point-of-care cardiac markers; myoglobin 78.4, CK-MB 5.1, troponin I less than 0.05.  Comprehensive metabolic panel; sodium 141, potassium 4.0, chloride 106, glucose 155, BUN was 12, creatinine 0.57, calcium 9.2.  Albumin 3.5, AST 21, ALT 20, alkaline phosphatase 16. CBC; white blood cell count 6.1, hemoglobin 11.5, hematocrit 35.4, platelet count 259,000.  RADIOLOGY:  Portable chest x-ray shows lungs are clear bilaterally, no confluent airspace opacities or pleural effusions or pneumothorax.  The heart is in normal size and contour.  No acute cardiopulmonary disease present.  ASSESSMENT AND PLAN: 1. Chest pain with dyspnea on exertion.  We will admit the patient to     telemetry for observational status, obtain cardiac markers q.6     hours  x3.  Provide morphine p.r.n. and nitro p.r.n. for chest pain.     The patient does not appear to be in any acute distress at this     time or in active chest pain at this time.  I do not suspect this     is cardiac in nature.  However, with continued dyspnea on exertion     and her high risk factors, I will obtain a 2-D echo and a BNP to     further work up for possible congestive heart failure.  The patient     does have an ASPIRIN allergy and I will administer Plavix 75 mg     daily.  The patient does have an extensive history of     gastroesophageal reflux disease.  She also has a history of     diabetes.  This could be a gastrointestinal-related issue.  This     was discussed in detail with the patient.  The patient also had a     negative cardiac cath back in 2005.  I do not see the need for     Cardiology at this time.  If the patient's cardiac markers as well     as echo and BNP are negative, she will likely be able to be     discharged and her workup can be further administered outpatient.     I will also obtain a fasting lipid panel to further assess the     patient's cardiovascular  status. 2. Type 2 diabetes, insulin dependent.  We will place the patient on     moderate sliding scale insulin.  The patient is also on Lantus 35     units subcu at bedtime.  We will continue the patient on her     insulin regimen.  We will place the patient on carb-modified diet,     heart healthy. 3. Left great toe ulcer.  This has been managed quite closely by [facility].  We will place orders to wash her ulcer twice a day with     antibacterial wash.  [facility] Care will likely need to see this     patient while she is here to make sure she is having appropriate     dressing changes. 4. Gastroesophageal reflux disease.  I will continue the patient's     home med reconciliation at this time.  Please see pharmacy med rec.     I will also give order for p.r.n. Mylanta. 5. Now, the patient is a full code.    ______________________________ Arlyn Leak, PA   ______________________________ Houston Siren, MD    JH/MEDQ  D:  01/09/2011  T:  01/09/2011  Job:  [id number]  Electronically Signed by Arlyn Leak PA on 01/16/2011 04:37:12 AM Electronically Signed by Houston Siren  on 01/18/2011 03:38:48 AM

## 2011-02-08 LAB — POCT I-STAT, CHEM 8
BUN: 7 mg/dL (ref 6–23)
Calcium, Ion: 1.2 mmol/L (ref 1.12–1.32)
Creatinine, Ser: 0.8 mg/dL (ref 0.4–1.2)
TCO2: 22 mmol/L (ref 0–100)

## 2011-02-08 LAB — CBC
HCT: 34.7 % — ABNORMAL LOW (ref 36.0–46.0)
HCT: 34.9 % — ABNORMAL LOW (ref 36.0–46.0)
Hemoglobin: 10.9 g/dL — ABNORMAL LOW (ref 12.0–15.0)
Hemoglobin: 11.6 g/dL — ABNORMAL LOW (ref 12.0–15.0)
MCH: 27.7 pg (ref 26.0–34.0)
MCH: 27.8 pg (ref 26.0–34.0)
MCH: 28.6 pg (ref 26.0–34.0)
MCH: 29 pg (ref 26.0–34.0)
MCHC: 31.1 g/dL (ref 30.0–36.0)
MCHC: 31.2 g/dL (ref 30.0–36.0)
MCHC: 31.4 g/dL (ref 30.0–36.0)
MCHC: 31.8 g/dL (ref 30.0–36.0)
MCV: 89.4 fL (ref 78.0–100.0)
MCV: 90.1 fL (ref 78.0–100.0)
MCV: 91.6 fL (ref 78.0–100.0)
Platelets: 207 10*3/uL (ref 150–400)
Platelets: 256 10*3/uL (ref 150–400)
Platelets: 258 10*3/uL (ref 150–400)
RBC: 3.79 MIL/uL — ABNORMAL LOW (ref 3.87–5.11)
RBC: 4.05 MIL/uL (ref 3.87–5.11)
RBC: 4.08 MIL/uL (ref 3.87–5.11)
RDW: 15.6 % — ABNORMAL HIGH (ref 11.5–15.5)
WBC: 3.9 10*3/uL — ABNORMAL LOW (ref 4.0–10.5)
WBC: 4.1 10*3/uL (ref 4.0–10.5)

## 2011-02-08 LAB — BASIC METABOLIC PANEL
BUN: 2 mg/dL — ABNORMAL LOW (ref 6–23)
CO2: 23 mEq/L (ref 19–32)
CO2: 27 mEq/L (ref 19–32)
Calcium: 8.8 mg/dL (ref 8.4–10.5)
Calcium: 8.8 mg/dL (ref 8.4–10.5)
Creatinine, Ser: 0.47 mg/dL (ref 0.4–1.2)
Glucose, Bld: 261 mg/dL — ABNORMAL HIGH (ref 70–99)
Glucose, Bld: 324 mg/dL — ABNORMAL HIGH (ref 70–99)
Potassium: 4.1 mEq/L (ref 3.5–5.1)
Sodium: 138 mEq/L (ref 135–145)

## 2011-02-08 LAB — URINALYSIS, ROUTINE W REFLEX MICROSCOPIC
Bilirubin Urine: NEGATIVE
Glucose, UA: >1000 mg/dL — AB
Hgb urine dipstick: NEGATIVE
Ketones, ur: >80 mg/dL — AB
pH: 6 (ref 5.0–8.0)

## 2011-02-08 LAB — COMPREHENSIVE METABOLIC PANEL
ALT: 11 U/L (ref 0–35)
AST: 11 U/L (ref 0–37)
CO2: 23 mEq/L (ref 19–32)
Calcium: 8.9 mg/dL (ref 8.4–10.5)
Chloride: 109 mEq/L (ref 96–112)
GFR calc Af Amer: >60 mL/min (ref >60)
GFR calc non Af Amer: >60 mL/min (ref >60)
Sodium: 139 mEq/L (ref 135–145)

## 2011-02-08 LAB — LIPID PANEL
HDL: 53 mg/dL (ref >39)
Total CHOL/HDL Ratio: 3.6 RATIO
Triglycerides: 95 mg/dL (ref <150)
VLDL: 19 mg/dL (ref 0–40)

## 2011-02-08 LAB — DIFFERENTIAL
Basophils Relative: 0 % (ref 0–1)
Basophils Relative: 2 % — ABNORMAL HIGH (ref 0–1)
Eosinophils Absolute: 0 10*3/uL (ref 0.0–0.7)
Eosinophils Absolute: 0.1 10*3/uL (ref 0.0–0.7)
Eosinophils Absolute: 0.1 10*3/uL (ref 0.0–0.7)
Eosinophils Absolute: 0.1 10*3/uL (ref 0.0–0.7)
Eosinophils Relative: 1 % (ref 0–5)
Eosinophils Relative: 2 % (ref 0–5)
Eosinophils Relative: 2 % (ref 0–5)
Lymphocytes Relative: 57 % — ABNORMAL HIGH (ref 12–46)
Lymphs Abs: 1.7 10*3/uL (ref 0.7–4.0)
Lymphs Abs: 1.8 10*3/uL (ref 0.7–4.0)
Lymphs Abs: 2.3 10*3/uL (ref 0.7–4.0)
Lymphs Abs: 2.5 10*3/uL (ref 0.7–4.0)
Monocytes Absolute: 0.2 10*3/uL (ref 0.1–1.0)
Monocytes Absolute: 0.3 10*3/uL (ref 0.1–1.0)
Monocytes Relative: 6 % (ref 3–12)
Monocytes Relative: 6 % (ref 3–12)
Neutrophils Relative %: 32 % — ABNORMAL LOW (ref 43–77)

## 2011-02-08 LAB — GLUCOSE, CAPILLARY
Glucose-Capillary: 180 mg/dL — ABNORMAL HIGH (ref 70–99)
Glucose-Capillary: 205 mg/dL — ABNORMAL HIGH (ref 70–99)
Glucose-Capillary: 210 mg/dL — ABNORMAL HIGH (ref 70–99)
Glucose-Capillary: 215 mg/dL — ABNORMAL HIGH (ref 70–99)
Glucose-Capillary: 221 mg/dL — ABNORMAL HIGH (ref 70–99)
Glucose-Capillary: 249 mg/dL — ABNORMAL HIGH (ref 70–99)
Glucose-Capillary: 313 mg/dL — ABNORMAL HIGH (ref 70–99)
Glucose-Capillary: 323 mg/dL — ABNORMAL HIGH (ref 70–99)
Glucose-Capillary: 472 mg/dL — ABNORMAL HIGH (ref 70–99)

## 2011-02-08 LAB — SEDIMENTATION RATE: Sed Rate: 73 mm/hr — ABNORMAL HIGH (ref 0–22)

## 2011-02-08 LAB — URINE MICROSCOPIC-ADD ON

## 2011-02-08 LAB — MAGNESIUM
Magnesium: 1.6 mg/dL (ref 1.5–2.5)
Magnesium: 1.7 mg/dL (ref 1.5–2.5)

## 2011-02-08 LAB — HEMOGLOBIN A1C: Hgb A1c MFr Bld: 12.5 % — ABNORMAL HIGH (ref <5.7)

## 2011-02-15 ENCOUNTER — Encounter (HOSPITAL_BASED_OUTPATIENT_CLINIC_OR_DEPARTMENT_OTHER): Payer: Self-pay

## 2011-03-04 LAB — WOUND CULTURE

## 2011-03-16 ENCOUNTER — Encounter (HOSPITAL_BASED_OUTPATIENT_CLINIC_OR_DEPARTMENT_OTHER): Payer: Self-pay | Attending: Plastic Surgery

## 2011-03-16 ENCOUNTER — Other Ambulatory Visit: Payer: Self-pay | Admitting: Nurse Practitioner

## 2011-03-16 ENCOUNTER — Other Ambulatory Visit (HOSPITAL_COMMUNITY): Payer: Self-pay | Admitting: Internal Medicine

## 2011-03-16 ENCOUNTER — Ambulatory Visit (HOSPITAL_COMMUNITY)
Admission: RE | Admit: 2011-03-16 | Discharge: 2011-03-16 | Disposition: A | Discharge status: Home / Self Care | Payer: Self-pay | Source: Ambulatory Visit | Attending: General Surgery | Admitting: General Surgery

## 2011-03-16 ENCOUNTER — Other Ambulatory Visit (HOSPITAL_BASED_OUTPATIENT_CLINIC_OR_DEPARTMENT_OTHER): Payer: Self-pay | Admitting: General Surgery

## 2011-03-16 DIAGNOSIS — L97509 Non-pressure chronic ulcer of other part of unspecified foot with unspecified severity: Secondary | ICD-10-CM | POA: Insufficient documentation

## 2011-03-16 DIAGNOSIS — M7730 Calcaneal spur, unspecified foot: Secondary | ICD-10-CM | POA: Insufficient documentation

## 2011-03-16 DIAGNOSIS — M79609 Pain in unspecified limb: Secondary | ICD-10-CM | POA: Insufficient documentation

## 2011-03-16 DIAGNOSIS — Z79899 Other long term (current) drug therapy: Secondary | ICD-10-CM | POA: Insufficient documentation

## 2011-03-16 DIAGNOSIS — Z794 Long term (current) use of insulin: Secondary | ICD-10-CM | POA: Insufficient documentation

## 2011-03-16 DIAGNOSIS — Z1231 Encounter for screening mammogram for malignant neoplasm of breast: Secondary | ICD-10-CM

## 2011-03-16 DIAGNOSIS — K219 Gastro-esophageal reflux disease without esophagitis: Secondary | ICD-10-CM | POA: Insufficient documentation

## 2011-03-16 DIAGNOSIS — I10 Essential (primary) hypertension: Secondary | ICD-10-CM | POA: Insufficient documentation

## 2011-03-16 DIAGNOSIS — E119 Type 2 diabetes mellitus without complications: Secondary | ICD-10-CM | POA: Insufficient documentation

## 2011-03-16 DIAGNOSIS — M79673 Pain in unspecified foot: Secondary | ICD-10-CM

## 2011-03-16 NOTE — Assessment & Plan Note (Signed)
[facility]  NAME:  [name], [name]              ACCOUNT NO.:  [id number]  MEDICAL RECORD NO.:  [id number]      DATE OF BIRTH:  1961-04-11  PHYSICIAN:  Joanne Gavel, M.D.         VISIT DATE:  03/16/2011                                  OFFICE VISIT   CHIEF COMPLAINT:  Recurrent ulcer, left great toe.  HISTORY OF PRESENT ILLNESS:  This 50 year old female diabetic for 20+ years discharged at the end of January for a healed ulcer at the tip of the right great toe comes back of recurrence.  She noticed approximately 5 days ago some drainage and a small hole.  PAST MEDICAL HISTORY:  Diabetes mellitus for many years, treated for acid reflux, hypertension with no neuropathy.  PAST SURGICAL HISTORY:  She has had cesarean section x4.  ALLERGIES:  PENICILLIN, IBUPROFEN.  MEDICATIONS:  Insulin, metformin, benazepril.  REVIEW OF SYSTEMS:  Otherwise essentially negative.  PHYSICAL EXAMINATION:  VITAL SIGNS:  Temperature 98, pulse 97, respirations 28, blood pressure 144/82. GENERAL APPEARANCE:  Well-developed, well-nourished in no distress. HEENT:  Head normocephalic.  Eyes:  Normal. NECK:  Supple. CHEST:  Clear. HEART:  Regular rhythm. ABDOMEN:  Not examined. EXTREMITIES:  Good peripheral pulses and general skin nutrition.  After debridement of a large callus, there was a 1.8 x 2.5 ulceration of the tip of the toe and plantar surface culture was taken.  IMPRESSION:  Recurrent diabetic foot ulcer.  TREATMENT PLAN:  Santyl, hydrogel, Adaptic gauze, and toe sock, see in 7 days.  X-ray of great toe, rule out osteomyelitis.     Joanne Gavel, M.D.     RA/MEDQ  D:  03/16/2011  T:  03/16/2011  Job:  [id number]

## 2011-03-24 NOTE — Assessment & Plan Note (Signed)
[facility]  NAME:  [name], [name]              ACCOUNT NO.:  [id number]  MEDICAL RECORD NO.:  [id number]      DATE OF BIRTH:  [birth year]  PHYSICIAN:  [name], DO       VISIT DATE:  03/23/2011                                  OFFICE VISIT   [name] is a 50 year old black female who is here for a followup on her left great toe wound.  She has some debridement done last week and over the past week, has been using Santyl and hydrogel with marked improvement.  It does not appear to be infected, but I do want her to continue with the doxycycline because of what the history has been and that it has improved greatly.  She states she is pleased with the progress as well.  She has not been wearing her blue shoes, but she is in a regular shoes.  PHYSICAL EXAMINATION:  GENERAL:  She is alert, oriented, and cooperative, in no acute distress, pleasant and encouraged. HEENT:  Her pupils are equal and reactive.  Her extraocular muscles intact. LUNGS:  Her breathing is clear. HEART:  Regular. EXTREMITIES:  Her lower extremity pulses are strong.  She did have diminished sensation noted on the left.  ASSESSMENT:  Left lower extremity great toe wound.  PLAN:  To switch from Santyl to triple antibiotic ointment, Dial soap soaks 2 times a day, vitamin C, elevation, offloading, and continue the doxycycline.  We will see her back in a week.     [name], DO     CS/MEDQ  D:  03/23/2011  T:  03/24/2011  Job:  [id number]

## 2011-03-25 ENCOUNTER — Ambulatory Visit (HOSPITAL_COMMUNITY): Payer: Self-pay | Attending: Internal Medicine

## 2011-03-30 ENCOUNTER — Encounter (HOSPITAL_BASED_OUTPATIENT_CLINIC_OR_DEPARTMENT_OTHER): Payer: Self-pay | Attending: Plastic Surgery

## 2011-03-30 DIAGNOSIS — Z79899 Other long term (current) drug therapy: Secondary | ICD-10-CM | POA: Insufficient documentation

## 2011-03-30 DIAGNOSIS — E1169 Type 2 diabetes mellitus with other specified complication: Secondary | ICD-10-CM | POA: Insufficient documentation

## 2011-03-30 DIAGNOSIS — L97509 Non-pressure chronic ulcer of other part of unspecified foot with unspecified severity: Secondary | ICD-10-CM | POA: Insufficient documentation

## 2011-03-30 DIAGNOSIS — G589 Mononeuropathy, unspecified: Secondary | ICD-10-CM | POA: Insufficient documentation

## 2011-03-30 DIAGNOSIS — Z794 Long term (current) use of insulin: Secondary | ICD-10-CM | POA: Insufficient documentation

## 2011-03-30 DIAGNOSIS — I10 Essential (primary) hypertension: Secondary | ICD-10-CM | POA: Insufficient documentation

## 2011-05-03 ENCOUNTER — Encounter (HOSPITAL_BASED_OUTPATIENT_CLINIC_OR_DEPARTMENT_OTHER): Payer: Self-pay | Attending: Plastic Surgery

## 2011-05-03 DIAGNOSIS — G589 Mononeuropathy, unspecified: Secondary | ICD-10-CM | POA: Insufficient documentation

## 2011-05-03 DIAGNOSIS — Z79899 Other long term (current) drug therapy: Secondary | ICD-10-CM | POA: Insufficient documentation

## 2011-05-03 DIAGNOSIS — E1169 Type 2 diabetes mellitus with other specified complication: Secondary | ICD-10-CM | POA: Insufficient documentation

## 2011-05-03 DIAGNOSIS — Z794 Long term (current) use of insulin: Secondary | ICD-10-CM | POA: Insufficient documentation

## 2011-05-03 DIAGNOSIS — I10 Essential (primary) hypertension: Secondary | ICD-10-CM | POA: Insufficient documentation

## 2011-05-03 DIAGNOSIS — L97509 Non-pressure chronic ulcer of other part of unspecified foot with unspecified severity: Secondary | ICD-10-CM | POA: Insufficient documentation

## 2011-06-09 NOTE — Progress Notes (Signed)
[facility]  NAME:  RICHARDINE, PEPPERS                   ACCOUNT NO.:  MEDICAL RECORD NO.:  [id number]      DATE OF BIRTH:  [birth year]  PHYSICIAN:  [name], DO            VISIT DATE:                                  OFFICE VISIT   Ms. [name] is a 50 year old black female who is here for followup on her left great toe wound.  She is diabetic.  Her hemoglobin A1c last was 7. She states she is working to bring that down and over the past week she has been using triple antibiotic ointment, dial soap, elevation and a non-contact boot at home.  She has no change in her medical history, medications or social history.  On exam, she is alert, oriented, and cooperative, in no acute distress. She is very pleasant.  She is pleased with her progress.  Her pupils are equal, reactive.  LUNGS:  Clear.  Heart is regular.  Abdomen is soft. Lower extremity skin is intact except for the left great toe which is markedly improved.  No sign of infection.  The wound is smaller.  There is no exudate.  Plan is to continue with the dial soap soak, triple antibiotic ointment, elevation and multivitamin, and we will see her back in a week.     [name], DO     CS/MEDQ  D:  04/06/2011  T:  04/06/2011  Job:  [id number]

## 2011-08-12 ENCOUNTER — Emergency Department (HOSPITAL_COMMUNITY)
Admission: EM | Admit: 2011-08-12 | Discharge: 2011-08-12 | Disposition: A | Discharge status: Home / Self Care | Payer: Self-pay | Attending: Emergency Medicine | Admitting: Emergency Medicine

## 2011-08-12 ENCOUNTER — Emergency Department (HOSPITAL_COMMUNITY): Payer: Self-pay

## 2011-08-12 DIAGNOSIS — I10 Essential (primary) hypertension: Secondary | ICD-10-CM | POA: Insufficient documentation

## 2011-08-12 DIAGNOSIS — M79609 Pain in unspecified limb: Secondary | ICD-10-CM | POA: Insufficient documentation

## 2011-08-12 DIAGNOSIS — Z79899 Other long term (current) drug therapy: Secondary | ICD-10-CM | POA: Insufficient documentation

## 2011-08-12 DIAGNOSIS — Z794 Long term (current) use of insulin: Secondary | ICD-10-CM | POA: Insufficient documentation

## 2011-08-12 DIAGNOSIS — E119 Type 2 diabetes mellitus without complications: Secondary | ICD-10-CM | POA: Insufficient documentation

## 2011-08-12 DIAGNOSIS — R42 Dizziness and giddiness: Secondary | ICD-10-CM | POA: Insufficient documentation

## 2011-08-12 DIAGNOSIS — K219 Gastro-esophageal reflux disease without esophagitis: Secondary | ICD-10-CM | POA: Insufficient documentation

## 2011-08-12 LAB — CBC
HCT: 41 % (ref 36.0–46.0)
Hemoglobin: 14.7 g/dL (ref 12.0–15.0)
MCHC: 35.9 g/dL (ref 30.0–36.0)
WBC: 7.5 10*3/uL (ref 4.0–10.5)

## 2011-08-12 LAB — LIPASE, BLOOD: Lipase: 39 U/L (ref 11–59)

## 2011-08-12 LAB — COMPREHENSIVE METABOLIC PANEL
ALT: 24 U/L (ref 0–35)
Albumin: 3.9 g/dL (ref 3.5–5.2)
Alkaline Phosphatase: 94 U/L (ref 39–117)
BUN: 24 mg/dL — ABNORMAL HIGH (ref 6–23)
Chloride: 101 mEq/L (ref 96–112)
GFR calc Af Amer: >60 mL/min (ref >60)
Glucose, Bld: 248 mg/dL — ABNORMAL HIGH (ref 70–99)
Potassium: 3.2 mEq/L — ABNORMAL LOW (ref 3.5–5.1)
Sodium: 134 mEq/L — ABNORMAL LOW (ref 135–145)
Total Bilirubin: 0.3 mg/dL (ref 0.3–1.2)
Total Protein: 7.8 g/dL (ref 6.0–8.3)

## 2011-08-12 LAB — URINE MICROSCOPIC-ADD ON

## 2011-08-12 LAB — DIFFERENTIAL
Basophils Absolute: 0 10*3/uL (ref 0.0–0.1)
Basophils Relative: 0 % (ref 0–1)
Lymphocytes Relative: 48 % — ABNORMAL HIGH (ref 12–46)
Monocytes Absolute: 0.5 10*3/uL (ref 0.1–1.0)
Neutro Abs: 3.3 10*3/uL (ref 1.7–7.7)
Neutrophils Relative %: 45 % (ref 43–77)

## 2011-08-12 LAB — URINALYSIS, ROUTINE W REFLEX MICROSCOPIC
Ketones, ur: 15 mg/dL — AB
Specific Gravity, Urine: 1.032 — ABNORMAL HIGH (ref 1.005–1.030)
pH: 5.5 (ref 5.0–8.0)

## 2011-08-12 LAB — POCT I-STAT TROPONIN I: Troponin i, poc: 0 ng/mL (ref 0.00–0.08)

## 2011-08-13 LAB — URINE CULTURE

## 2011-10-11 ENCOUNTER — Other Ambulatory Visit (HOSPITAL_BASED_OUTPATIENT_CLINIC_OR_DEPARTMENT_OTHER): Payer: Self-pay | Admitting: General Surgery

## 2011-10-11 ENCOUNTER — Ambulatory Visit (HOSPITAL_COMMUNITY)
Admission: RE | Admit: 2011-10-11 | Discharge: 2011-10-11 | Disposition: A | Discharge status: Home / Self Care | Payer: Self-pay | Source: Ambulatory Visit | Attending: General Surgery | Admitting: General Surgery

## 2011-10-11 ENCOUNTER — Encounter (HOSPITAL_BASED_OUTPATIENT_CLINIC_OR_DEPARTMENT_OTHER): Payer: Self-pay | Attending: General Surgery

## 2011-10-11 DIAGNOSIS — M869 Osteomyelitis, unspecified: Secondary | ICD-10-CM

## 2011-10-11 DIAGNOSIS — Z794 Long term (current) use of insulin: Secondary | ICD-10-CM | POA: Insufficient documentation

## 2011-10-11 DIAGNOSIS — Z79899 Other long term (current) drug therapy: Secondary | ICD-10-CM | POA: Insufficient documentation

## 2011-10-11 DIAGNOSIS — I10 Essential (primary) hypertension: Secondary | ICD-10-CM | POA: Insufficient documentation

## 2011-10-11 DIAGNOSIS — L97509 Non-pressure chronic ulcer of other part of unspecified foot with unspecified severity: Secondary | ICD-10-CM | POA: Insufficient documentation

## 2011-10-11 DIAGNOSIS — E1169 Type 2 diabetes mellitus with other specified complication: Secondary | ICD-10-CM | POA: Insufficient documentation

## 2011-10-11 DIAGNOSIS — Z0389 Encounter for observation for other suspected diseases and conditions ruled out: Secondary | ICD-10-CM | POA: Insufficient documentation

## 2011-10-11 DIAGNOSIS — M7730 Calcaneal spur, unspecified foot: Secondary | ICD-10-CM | POA: Insufficient documentation

## 2011-10-11 LAB — GLUCOSE, CAPILLARY: Glucose-Capillary: 154 mg/dL — ABNORMAL HIGH (ref 70–99)

## 2011-10-11 NOTE — H&P (Signed)
[name], [name]              ACCOUNT NO.:  [id number]  MEDICAL RECORD NO.:  [id number]  LOCATION:  FOOT                         FACILITY:  [facility]  PHYSICIAN:  Joanne Gavel, M.D.        DATE OF BIRTH:  13-Dec-1960  DATE OF ADMISSION:  10/11/2011 DATE OF DISCHARGE:                             HISTORY & PHYSICAL   CHIEF COMPLAINT:  Wound on the plantar surface of left foot.  HISTORY OF PRESENT ILLNESS:  This is a 50 year old female with type 2 diabetes, previously treated here for a diabetic foot ulcer on the great toe of the left foot.  Developed this wound approximately 10 days ago. There has been no pain, tenderness, or discharge.  She has not done any particular walking or changed her shoes or has any factors, which might have caused this problem.  PAST MEDICAL HISTORY:  Significant for hypertension, diabetes, and neuropathy.  PAST SURGICAL HISTORY:  C-section x4 and cardiac cath in 2004.  CIGARETTES AND ALCOHOL:  None.  MEDICATIONS:  NovoLog 70/30, Lantus insulin, metformin, benazepril, hydrochlorothiazide, and potassium.  ALLERGIES:  PENICILLIN and IBUPROFEN.  REVIEW OF SYSTEMS:  As above.  PHYSICAL EXAMINATION:  VITAL SIGNS:  Temperature 98.6, pulse 94, respirations 16, blood pressure 134/84.  Glucose is 154. GENERAL APPEARANCE:  Well developed, morbidly obese, in no distress. CRANIUM:  Normocephalic. EYES, EARS, NOSE, THROAT:  Normal. NECK:  Supple. CHEST:  Clear. HEART:  Regular rhythm. ABDOMEN:  Not examined. EXTREMITIES:  Examination of the left lower extremity reveals good peripheral pulses.  On the plantar surface of the second metatarsal head, there is a 0.3 x 0.3 wound with a large surrounding callus.  After debridement of callus, the wound is 1.4 x 2.1.  It does not probe to bone.  The base is clean.  IMPRESSION:  Diabetic foot ulcer.  PLAN OF TREATMENT:  X-ray for osteomyelitis, if this is absent, which I am quite sure it is, we will proceed to total  contact casting.  We will see the patient in 7 days and treat with Promogran for now.     Joanne Gavel, M.D.     RA/MEDQ  D:  10/11/2011  T:  10/11/2011  Job:  [id number]

## 2011-11-08 ENCOUNTER — Encounter (HOSPITAL_BASED_OUTPATIENT_CLINIC_OR_DEPARTMENT_OTHER): Payer: Self-pay | Attending: General Surgery

## 2011-11-08 DIAGNOSIS — I10 Essential (primary) hypertension: Secondary | ICD-10-CM | POA: Insufficient documentation

## 2011-11-08 DIAGNOSIS — E1169 Type 2 diabetes mellitus with other specified complication: Secondary | ICD-10-CM | POA: Insufficient documentation

## 2011-11-08 DIAGNOSIS — Z794 Long term (current) use of insulin: Secondary | ICD-10-CM | POA: Insufficient documentation

## 2011-11-08 DIAGNOSIS — L97509 Non-pressure chronic ulcer of other part of unspecified foot with unspecified severity: Secondary | ICD-10-CM | POA: Insufficient documentation

## 2011-11-08 DIAGNOSIS — Z79899 Other long term (current) drug therapy: Secondary | ICD-10-CM | POA: Insufficient documentation

## 2012-05-07 ENCOUNTER — Encounter (HOSPITAL_BASED_OUTPATIENT_CLINIC_OR_DEPARTMENT_OTHER): Payer: Self-pay | Attending: Plastic Surgery

## 2012-06-04 ENCOUNTER — Encounter (HOSPITAL_BASED_OUTPATIENT_CLINIC_OR_DEPARTMENT_OTHER): Payer: Self-pay | Attending: Plastic Surgery

## 2012-08-07 ENCOUNTER — Encounter (HOSPITAL_COMMUNITY): Payer: Self-pay | Admitting: *Deleted

## 2012-08-07 ENCOUNTER — Emergency Department (HOSPITAL_COMMUNITY): Payer: Self-pay

## 2012-08-07 ENCOUNTER — Inpatient Hospital Stay (HOSPITAL_COMMUNITY)
Admission: EM | Admit: 2012-08-07 | Discharge: 2012-08-09 | DRG: 989 | Disposition: A | Discharge status: Home / Self Care | Payer: MEDICAID | Attending: Internal Medicine | Admitting: Internal Medicine

## 2012-08-07 DIAGNOSIS — Z880 Allergy status to penicillin: Secondary | ICD-10-CM

## 2012-08-07 DIAGNOSIS — J45909 Unspecified asthma, uncomplicated: Secondary | ICD-10-CM | POA: Diagnosis present

## 2012-08-07 DIAGNOSIS — E1159 Type 2 diabetes mellitus with other circulatory complications: Secondary | ICD-10-CM | POA: Diagnosis present

## 2012-08-07 DIAGNOSIS — E1140 Type 2 diabetes mellitus with diabetic neuropathy, unspecified: Secondary | ICD-10-CM | POA: Diagnosis present

## 2012-08-07 DIAGNOSIS — E1149 Type 2 diabetes mellitus with other diabetic neurological complication: Principal | ICD-10-CM

## 2012-08-07 DIAGNOSIS — I10 Essential (primary) hypertension: Secondary | ICD-10-CM | POA: Diagnosis present

## 2012-08-07 DIAGNOSIS — I152 Hypertension secondary to endocrine disorders: Secondary | ICD-10-CM | POA: Diagnosis present

## 2012-08-07 DIAGNOSIS — E1142 Type 2 diabetes mellitus with diabetic polyneuropathy: Secondary | ICD-10-CM

## 2012-08-07 DIAGNOSIS — E1169 Type 2 diabetes mellitus with other specified complication: Secondary | ICD-10-CM | POA: Diagnosis present

## 2012-08-07 DIAGNOSIS — E119 Type 2 diabetes mellitus without complications: Secondary | ICD-10-CM | POA: Diagnosis present

## 2012-08-07 DIAGNOSIS — L97509 Non-pressure chronic ulcer of other part of unspecified foot with unspecified severity: Secondary | ICD-10-CM | POA: Diagnosis present

## 2012-08-07 DIAGNOSIS — Z7982 Long term (current) use of aspirin: Secondary | ICD-10-CM

## 2012-08-07 HISTORY — DX: Essential (primary) hypertension: I10

## 2012-08-07 LAB — CBC WITH DIFFERENTIAL/PLATELET
Basophils Absolute: 0 10*3/uL (ref 0.0–0.1)
HCT: 36.5 % (ref 36.0–46.0)
Hemoglobin: 12.3 g/dL (ref 12.0–15.0)
Lymphocytes Relative: 30 % (ref 12–46)
Monocytes Absolute: 0.5 10*3/uL (ref 0.1–1.0)
Monocytes Relative: 6 % (ref 3–12)
Neutro Abs: 5.5 10*3/uL (ref 1.7–7.7)
Neutrophils Relative %: 63 % (ref 43–77)
RDW: 13 % (ref 11.5–15.5)
WBC: 8.7 10*3/uL (ref 4.0–10.5)

## 2012-08-07 LAB — COMPREHENSIVE METABOLIC PANEL
ALT: 34 U/L (ref 0–35)
AST: 21 U/L (ref 0–37)
Alkaline Phosphatase: 81 U/L (ref 39–117)
CO2: 27 mEq/L (ref 19–32)
Chloride: 99 mEq/L (ref 96–112)
Creatinine, Ser: 1.12 mg/dL — ABNORMAL HIGH (ref 0.50–1.10)
GFR calc non Af Amer: 56 mL/min — ABNORMAL LOW (ref >90)
Potassium: 4.6 mEq/L (ref 3.5–5.1)
Total Bilirubin: 0.3 mg/dL (ref 0.3–1.2)

## 2012-08-07 LAB — GLUCOSE, CAPILLARY: Glucose-Capillary: 308 mg/dL — ABNORMAL HIGH (ref 70–99)

## 2012-08-07 MED ORDER — POTASSIUM CHLORIDE ER 10 MEQ PO TBCR
20.0000 meq | EXTENDED_RELEASE_TABLET | Freq: Every day | ORAL | Status: DC
  Administered 2012-08-08 – 2012-08-09 (×2): 20 meq via ORAL
  Filled 2012-08-07 (×2): qty 2

## 2012-08-07 MED ORDER — ALUM & MAG HYDROXIDE-SIMETH 200-200-20 MG/5ML PO SUSP
30.0000 mL | Freq: Four times a day (QID) | ORAL | Status: DC | PRN
  Administered 2012-08-09: 30 mL via ORAL
  Filled 2012-08-07: qty 30

## 2012-08-07 MED ORDER — VANCOMYCIN HCL IN DEXTROSE 1-5 GM/200ML-% IV SOLN
1000.0000 mg | Freq: Once | INTRAVENOUS | Status: AC
  Administered 2012-08-07: 1000 mg via INTRAVENOUS
  Filled 2012-08-07: qty 200

## 2012-08-07 MED ORDER — VANCOMYCIN HCL 1000 MG IV SOLR
1250.0000 mg | Freq: Two times a day (BID) | INTRAVENOUS | Status: DC
  Administered 2012-08-08 – 2012-08-09 (×4): 1250 mg via INTRAVENOUS
  Filled 2012-08-07 (×6): qty 1250

## 2012-08-07 MED ORDER — INSULIN ASPART 100 UNIT/ML ~~LOC~~ SOLN
0.0000 [IU] | Freq: Three times a day (TID) | SUBCUTANEOUS | Status: DC
  Administered 2012-08-07: 11 [IU] via SUBCUTANEOUS
  Administered 2012-08-08: 5 [IU] via SUBCUTANEOUS
  Filled 2012-08-07: qty 1

## 2012-08-07 MED ORDER — ACETAMINOPHEN 650 MG RE SUPP: 650.0000 mg | Freq: Four times a day (QID) | RECTAL | Status: DC | PRN

## 2012-08-07 MED ORDER — HYDROCHLOROTHIAZIDE 25 MG PO TABS
25.0000 mg | ORAL_TABLET | Freq: Every day | ORAL | Status: DC
  Administered 2012-08-08 – 2012-08-09 (×2): 25 mg via ORAL
  Filled 2012-08-07 (×2): qty 1

## 2012-08-07 MED ORDER — ALBUTEROL SULFATE (5 MG/ML) 0.5% IN NEBU
2.5000 mg | INHALATION_SOLUTION | RESPIRATORY_TRACT | Status: DC | PRN
  Filled 2012-08-07: qty 0.5

## 2012-08-07 MED ORDER — POLYETHYLENE GLYCOL 3350 17 G PO PACK
17.0000 g | PACK | Freq: Every day | ORAL | Status: DC | PRN
  Filled 2012-08-07: qty 1

## 2012-08-07 MED ORDER — INSULIN GLARGINE 100 UNIT/ML ~~LOC~~ SOLN
46.0000 [IU] | Freq: Every day | SUBCUTANEOUS | Status: DC
  Administered 2012-08-07 – 2012-08-08 (×2): 46 [IU] via SUBCUTANEOUS

## 2012-08-07 MED ORDER — SODIUM CHLORIDE 0.9 % IV SOLN
500.0000 mg | Freq: Once | INTRAVENOUS | Status: AC
  Administered 2012-08-07: 500 mg via INTRAVENOUS
  Filled 2012-08-07: qty 500

## 2012-08-07 MED ORDER — ONDANSETRON HCL 4 MG PO TABS: 4.0000 mg | ORAL_TABLET | Freq: Four times a day (QID) | ORAL | Status: DC | PRN

## 2012-08-07 MED ORDER — SODIUM CHLORIDE 0.9 % IV SOLN
500.0000 mg | Freq: Four times a day (QID) | INTRAVENOUS | Status: DC
  Administered 2012-08-07 – 2012-08-08 (×2): 500 mg via INTRAVENOUS
  Filled 2012-08-07 (×4): qty 500

## 2012-08-07 MED ORDER — ACETAMINOPHEN 325 MG PO TABS: 650.0000 mg | ORAL_TABLET | Freq: Four times a day (QID) | ORAL | Status: DC | PRN

## 2012-08-07 MED ORDER — INSULIN ASPART 100 UNIT/ML ~~LOC~~ SOLN
6.0000 [IU] | Freq: Three times a day (TID) | SUBCUTANEOUS | Status: DC
  Administered 2012-08-08: 6 [IU] via SUBCUTANEOUS

## 2012-08-07 MED ORDER — ONDANSETRON HCL 4 MG/2ML IJ SOLN: 4.0000 mg | Freq: Four times a day (QID) | INTRAMUSCULAR | Status: DC | PRN

## 2012-08-07 MED ORDER — SODIUM CHLORIDE 0.9 % IV SOLN
INTRAVENOUS | Status: DC
  Administered 2012-08-07: 22:00:00 via INTRAVENOUS

## 2012-08-07 MED ORDER — HYDROCODONE-ACETAMINOPHEN 5-325 MG PO TABS
1.0000 | ORAL_TABLET | ORAL | Status: DC | PRN
  Administered 2012-08-07 – 2012-08-09 (×4): 2 via ORAL
  Filled 2012-08-07 (×4): qty 2

## 2012-08-07 MED ORDER — BENAZEPRIL HCL 10 MG PO TABS
10.0000 mg | ORAL_TABLET | Freq: Every day | ORAL | Status: DC
  Administered 2012-08-08 – 2012-08-09 (×2): 10 mg via ORAL
  Filled 2012-08-07 (×2): qty 1

## 2012-08-07 NOTE — ED Notes (Signed)
IV TEAM ENROUTE TO START IV

## 2012-08-07 NOTE — ED Notes (Cosign Needed)
1445 patient coming from Triage for ? Infected toe with prior osteomyelitis.  Labs and x-rays pending.  Will pass report on to next midlevel provider.    Remi Haggard, NP 08/07/12 1644

## 2012-08-07 NOTE — Progress Notes (Signed)
ANTIBIOTIC CONSULT NOTE - INITIAL  Pharmacy Consult for Vancomycin and Primaxin Indication: diabetic foot ulcer  Allergies  Allergen Reactions  . Aspirin Nausea And Vomiting  . Ibuprofen Nausea And Vomiting and Other (See Comments)    abd pain  . Penicillins Other (See Comments)    unknown    Patient Measurements: Height: 6\' 1"  (185.4 cm) Weight: 292 lb (132.45 kg) IBW/kg (Calculated) : 75.4   Vital Signs: Temp: 98.3 F (36.8 C) (09/10 1741) Temp src: Oral (09/10 1741) BP: 119/72 mmHg (09/10 1741) Pulse Rate: 90  (09/10 1741) Intake/Output from previous day:   Intake/Output from this shift:    Labs:  Basename 08/07/12 1504  WBC 8.7  HGB 12.3  PLT 259  LABCREA --  CREATININE 1.12*   Estimated Creatinine Clearance: 93.2 ml/min (by C-G formula based on Cr of 1.12). No results found for this basename: VANCOTROUGH:2,VANCOPEAK:2,VANCORANDOM:2,GENTTROUGH:2,GENTPEAK:2,GENTRANDOM:2,TOBRATROUGH:2,TOBRAPEAK:2,TOBRARND:2,AMIKACINPEAK:2,AMIKACINTROU:2,AMIKACIN:2, in the last 72 hours   Microbiology: No results found for this or any previous visit (from the past 720 hour(s)).  Medical History: Past Medical History  Diagnosis Date  . Diabetes mellitus   . Hypertension     Medications:  See PTA medication list  Assessment: 51 yo female who presents to the ED with drainage from her left great toe and fever. Pharmacy consulted to dose vancomycin and Primaxin. Patient received vancomycin 1000 mg at 17:51 and Primaxin 500 mg IV at 16:58 in the ED.  Goal of Therapy:  Vancomycin trough level 10-15 mcg/ml  Plan:  -Vancomycin 1250 mg IV q12h -Primaxin 500 mg IV q6h -Monitor renal function and clinical progress   [facility], [address].D., BCPS Clinical Pharmacist Pager: [phone] 08/07/2012 7:24 PM

## 2012-08-07 NOTE — H&P (Signed)
PATIENT DETAILS Name: [name] Age: 51 y.o. Sex: female Date of Birth: July 31, 1961 Admit Date: 08/07/2012 PCP:Pcp Not In System   CHIEF COMPLAINT:  Drainage from Left Great toe with subjective fever for 3 days  HPI: Patient is a very pleasant 51 year old [place] American female with a past medical history of diabetes and hypertension, who presented to the ED for the above-noted complaints. The patient for the past 3 days she has noticed that there has been a pinkish discharge from her left great toe. She claims to have at associated fever with chills, she claims that this is mostly subjective and did not actually measure it. She claims that after she took a Tylenol she starts sweating. Since the symptoms were persistent she decided to present to the ED for further evaluation and treatment. She denies any headache, she denies any chest pain. She denies any shortness of breath. She denies any abdominal pain with nausea of vomiting. She denies any diarrhea as well. She is now being admitted to the hospitalist service for further evaluation and treatment.   ALLERGIES:   Allergies  Allergen Reactions  . Aspirin Nausea And Vomiting  . Ibuprofen Nausea And Vomiting and Other (See Comments)    abd pain  . Penicillins Other (See Comments)    unknown    PAST MEDICAL HISTORY: Past Medical History  Diagnosis Date  . Diabetes mellitus   . Hypertension     PAST SURGICAL HISTORY: History reviewed. No pertinent past surgical history.  MEDICATIONS AT HOME: Prior to Admission medications   Medication Sig Start Date End Date Taking? Authorizing Provider  benazepril (LOTENSIN) 10 MG tablet Take 10 mg by mouth daily.   Yes Historical Provider, MD  hydrochlorothiazide (HYDRODIURIL) 25 MG tablet Take 25 mg by mouth daily.   Yes Historical Provider, MD  insulin aspart protamine-insulin aspart (NOVOLOG 70/30) (70-30) 100 UNIT/ML injection Inject 35-50 Units into the skin 2 (two) times daily with  a meal. 50 in the morning, 35 in the evening   Yes Historical Provider, MD  insulin glargine (LANTUS) 100 UNIT/ML injection Inject 46 Units into the skin at bedtime.   Yes Historical Provider, MD  metFORMIN (GLUCOPHAGE) 1000 MG tablet Take 1,000 mg by mouth 2 (two) times daily with a meal.   Yes Historical Provider, MD  potassium chloride (K-DUR) 10 MEQ tablet Take 20 mEq by mouth daily.   Yes Historical Provider, MD    FAMILY HISTORY: Patient's mother has hypertension and diabetes..  SOCIAL HISTORY:  reports that she has never smoked. She does not have any smokeless tobacco history on file. She reports that she does not drink alcohol. Her drug history not on file.  REVIEW OF SYSTEMS:  Constitutional:   No  weight loss, night sweats,  fatigue.  HEENT:    No headaches, Difficulty swallowing,Tooth/dental problems,Sore throat,  No sneezing, itching, ear ache, nasal congestion, post nasal drip,   Cardio-vascular: No chest pain,  Orthopnea, PND, swelling in lower extremities, anasarca,  dizziness, palpitations  GI:  No heartburn, indigestion, abdominal pain, nausea, vomiting, diarrhea, change in  bowel habits, loss of appetite  Resp: No shortness of breath with exertion or at rest.  No excess mucus, no productive cough, No non-productive cough,  No coughing up of blood.No change in color of mucus.No wheezing.No chest wall deformity  Skin:  no rash or lesions.  GU:  no dysuria, change in color of urine, no urgency or frequency.  No flank pain.  Musculoskeletal: No joint pain or  swelling.  No decreased range of motion.  No back pain.  Psych: No change in mood or affect. No depression or anxiety.  No memory loss.   PHYSICAL EXAM: Blood pressure 119/72, pulse 90, temperature 98.3 F (36.8 C), temperature source Oral, resp. rate 20, SpO2 100.00%.  General appearance :Awake, alert, not in any distress. Speech Clear. Not toxic Looking HEENT: Atraumatic and Normocephalic, pupils  equally reactive to light and accomodation Neck: supple, no JVD. No cervical lymphadenopathy.  Chest:Good air entry bilaterally, no added sounds  CVS: S1 S2 regular, no murmurs.  Abdomen: Bowel sounds present, Non tender and not distended with no gaurding, rigidity or rebound. Extremities: B/L Lower Ext shows no edema, both legs are warm to touch, with  dorsalis pedis pulses palpable. Left great toe-has a what looks like a callus with an opening at its tip-pinkish drainage is visible. Mildly swollen and moderately tender left great toe. Neurology: Awake alert, and oriented X 3, CN II-XII intact, Non focal Skin:No Rash Wounds:N/A  LABS ON ADMISSION:   Basename 08/07/12 1504  NA 137  K 4.6  CL 99  CO2 27  GLUCOSE 206*  BUN 19  CREATININE 1.12*  CALCIUM 9.9  MG --  PHOS --    Basename 08/07/12 1504  AST 21  ALT 34  ALKPHOS 81  BILITOT 0.3  PROT 8.3  ALBUMIN 4.0   No results found for this basename: LIPASE:2,AMYLASE:2 in the last 72 hours  Basename 08/07/12 1504  WBC 8.7  NEUTROABS 5.5  HGB 12.3  HCT 36.5  MCV 95.3  PLT 259   No results found for this basename: CKTOTAL:3,CKMB:3,CKMBINDEX:3,TROPONINI:3 in the last 72 hours No results found for this basename: DDIMER:2 in the last 72 hours No components found with this basename: POCBNP:3   RADIOLOGIC STUDIES ON ADMISSION: Dg Foot 2 Views Left  08/07/2012  *RADIOLOGY REPORT*  Clinical Data: Pain and swelling of the great toe.  LEFT FOOT - 2 VIEW  Comparison:  October 11, 2011  Findings: There is no acute fracture or dislocation.  There is plantar calcaneal spur.  There is soft tissue swelling and deformity in the distal soft tissues of the great toe.  There is no bony changes to suggest osteomyelitis.  IMPRESSION: Soft tissue swelling of the great toe.  No underlying bony abnormality identified.   Original Report Authenticated By: Sherian Rein, M.D.     ASSESSMENT AND PLAN: Present on Admission:  .DIABETIC FOOT ULCER,  TOE -We'll empirically started on vancomycin and Primaxin  -Have already spoken with Dr. Derrell Lolling orthopedics-he does not recommend any further studies at this point in time, he will evaluate her and provide further recommendations to [name].   Marland KitchenHYPERTENSION, BENIGN ESSENTIAL -Will continue with her usual antihypertensive medications-benazepril and hydrochlorothiazide  .DIABETES MELLITUS, TYPE II -We'll continue with the usual dosing of Lantus, would add pre-meal NovoLog and also place on sliding scale insulin.  - Hold off on resuming metformin and insulin 70/30   .DIABETIC PERIPHERAL NEUROPATHY -Apparently patient has decreased sensation in her legs, however it is not currently painful.   .ASTHMA -Lungs are clear, this is stable. As needed bronchodilators.  Further plan will depend as patient's clinical course evolves and further radiologic and laboratory data become available. Patient will be monitored closely.  DVT Prophylaxis: -SCDs-as the patient may need a trip to the OR.  Code Status: -Full code  Total time spent for admission equals 45 minutes.  [facility] Pager [phone]  If 7PM-7AM, please contact  night-coverage [email or web address] Password [email or web address] 08/07/2012, 6:08 PM

## 2012-08-07 NOTE — ED Notes (Signed)
Pt reports infection to right big toe at distal end x 2 days. Denies any pain. States hard to tell if she has any swelling.

## 2012-08-07 NOTE — ED Notes (Signed)
IV TEAM AT BEDSIDE 

## 2012-08-07 NOTE — ED Provider Notes (Signed)
Spoke with hospitalist (on for unassigned admission) regarding patient--will admit for diabetic foot ulcer.  Jimmye Norman, NP 08/07/12 [phone]

## 2012-08-07 NOTE — ED Notes (Signed)
Complaining of headache. Rates pain as 8/10.

## 2012-08-08 ENCOUNTER — Encounter (HOSPITAL_COMMUNITY)
Admission: EM | Disposition: A | Discharge status: Home / Self Care | Payer: Self-pay | Source: Home / Self Care | Attending: Internal Medicine

## 2012-08-08 DIAGNOSIS — E119 Type 2 diabetes mellitus without complications: Secondary | ICD-10-CM

## 2012-08-08 LAB — CBC
HCT: 33.7 % — ABNORMAL LOW (ref 36.0–46.0)
MCHC: 32.6 g/dL (ref 30.0–36.0)
MCV: 95.7 fL (ref 78.0–100.0)
RDW: 13 % (ref 11.5–15.5)

## 2012-08-08 LAB — URINALYSIS, ROUTINE W REFLEX MICROSCOPIC
Leukocytes, UA: NEGATIVE
Nitrite: NEGATIVE
Specific Gravity, Urine: 1.012 (ref 1.005–1.030)
Urobilinogen, UA: 0.2 mg/dL (ref 0.0–1.0)

## 2012-08-08 LAB — COMPREHENSIVE METABOLIC PANEL
Albumin: 3.2 g/dL — ABNORMAL LOW (ref 3.5–5.2)
BUN: 15 mg/dL (ref 6–23)
Creatinine, Ser: 0.7 mg/dL (ref 0.50–1.10)
Total Protein: 7 g/dL (ref 6.0–8.3)

## 2012-08-08 LAB — GLUCOSE, CAPILLARY: Glucose-Capillary: 176 mg/dL — ABNORMAL HIGH (ref 70–99)

## 2012-08-08 LAB — PROTIME-INR
INR: 1.11 (ref 0.00–1.49)
Prothrombin Time: 14.5 seconds (ref 11.6–15.2)

## 2012-08-08 LAB — HEMOGLOBIN A1C
Hgb A1c MFr Bld: 7.6 % — ABNORMAL HIGH (ref <5.7)
Mean Plasma Glucose: 171 mg/dL — ABNORMAL HIGH (ref <117)

## 2012-08-08 SURGERY — IRRIGATION AND DEBRIDEMENT EXTREMITY
Anesthesia: General | Laterality: Left

## 2012-08-08 MED ORDER — INFLUENZA VIRUS VACC SPLIT PF IM SUSP
0.5000 mL | INTRAMUSCULAR | Status: AC
  Administered 2012-08-09: 0.5 mL via INTRAMUSCULAR
  Filled 2012-08-08: qty 0.5

## 2012-08-08 MED ORDER — INSULIN ASPART PROT & ASPART (70-30 MIX) 100 UNIT/ML ~~LOC~~ SUSP
30.0000 [IU] | Freq: Two times a day (BID) | SUBCUTANEOUS | Status: DC
  Administered 2012-08-08 – 2012-08-09 (×3): 30 [IU] via SUBCUTANEOUS
  Filled 2012-08-08 (×3): qty 3

## 2012-08-08 MED ORDER — CEFTRIAXONE SODIUM 1 G IJ SOLR
1.0000 g | INTRAMUSCULAR | Status: DC
  Administered 2012-08-08: 1 g via INTRAVENOUS
  Filled 2012-08-08 (×2): qty 10

## 2012-08-08 NOTE — Progress Notes (Signed)
[facility] PROGRESS NOTE  Morning [name]:[id number] DOB: [birth year] DOA: 08/07/2012 PCP: Pcp Not In System  Assessment/Plan: Principal Problem:  *DIABETIC FOOT ULCER, TOE Active Problems:  DIABETES MELLITUS, TYPE II  DIABETIC PERIPHERAL NEUROPATHY  HYPERTENSION, BENIGN ESSENTIAL  ASTHMA  1. Diabetic foot ulcer Wagner 3 - s/p I and D at bedside by Dr. Verl Blalock on 9/11.13 - plan for 2 weeks of iv Vanc as outpatient and then f/u in office with him. Started on Vanc and Primaxin on admission . Changed to [place] and Rocephin 08/08/12  2. DM 2 - Lantus and 70/30 to mirror home regimen - she will need a regimen based on generic insulin at DC  3. HTn - resumed home meds   Code Status: full Family Communication: husband Disposition Plan: home   Brief narrative: Patient is a very pleasant 51 year old African American female with a past medical history of diabetes and hypertension, who presented to the ED for the above-noted complaints. The patient for the past 3 days she has noticed that there has been a pinkish discharge from her left great toe. She claims to have at associated fever with chills, she claims that this is mostly subjective and did not actually measure it. She claims that after she took a Tylenol she starts sweating. Since the symptoms were persistent she decided to present to the ED for further evaluation and treatment. She denies any headache, she denies any chest pain. She denies any shortness of breath. She denies any abdominal pain with nausea of vomiting. She denies any diarrhea as well. She is now being admitted to the hospitalist service for further evaluation and treatment.      Consultants:  [facility]  Procedures:  I and D  Antibiotics:  Vanc9/10   Primaxin 9/10-9/11  Rocephin 9/11-   HPI/Subjective: No new issues   Objective: Filed Vitals:   08/07/12 1922 08/07/12 2031 08/07/12 2114 08/08/12 0614  BP:  122/82 123/60 113/69  Pulse:  89 85 85    Temp:  98.4 F (36.9 C) 98.5 F (36.9 C) 97.9 F (36.6 C)  TempSrc:  Oral Oral Oral  Resp:  20 20 18   Height: 6\' 1"  (1.854 m)     Weight: 132.45 kg (292 lb)   131.407 kg (289 lb 11.2 oz)  SpO2:  98% 100% 100%    Intake/Output Summary (Last 24 hours) at 08/08/12 1227 Last data filed at 08/08/12 1100  Gross per 24 hour  Intake    752 ml  Output    725 ml  Net     27 ml   Filed Weights   08/07/12 1922 08/08/12 0614  Weight: 132.45 kg (292 lb) 131.407 kg (289 lb 11.2 oz)    Exam:   General:  axox3  Cardiovascular: RRR  Respiratory: ctab  Abdomen: soft, Nt   Data Reviewed: Basic Metabolic Panel:  Lab 08/08/12 9629 08/07/12 1504  NA 138 137  K 4.2 4.6  CL 102 99  CO2 29 27  GLUCOSE 229* 206*  BUN 15 19  CREATININE 0.70 1.12*  CALCIUM 9.3 9.9  MG -- --  PHOS -- --   Liver Function Tests:  Lab 08/08/12 0525 08/07/12 1504  AST 18 21  ALT 29 34  ALKPHOS 73 81  BILITOT 0.2* 0.3  PROT 7.0 8.3  ALBUMIN 3.2* 4.0   No results found for this basename: LIPASE:5,AMYLASE:5 in the last 168 hours No results found for this basename: AMMONIA:5 in the last 168 hours CBC:  Lab 08/08/12 0525 08/07/12 1504  WBC 5.7 8.7  NEUTROABS -- 5.5  HGB 11.0* 12.3  HCT 33.7* 36.5  MCV 95.7 95.3  PLT 235 259   Cardiac Enzymes: No results found for this basename: CKTOTAL:5,CKMB:5,CKMBINDEX:5,TROPONINI:5 in the last 168 hours BNP (last 3 results) No results found for this basename: PROBNP:3 in the last 8760 hours CBG:  Lab 08/08/12 0748 08/07/12 2150 08/07/12 2027 08/07/12 1319  GLUCAP 214* 239* 308* 185*    No results found for this or any previous visit (from the past 240 hour(s)).   Studies: Dg Foot 2 Views Left  08/07/2012  *RADIOLOGY REPORT*  Clinical Data: Pain and swelling of the great toe.  LEFT FOOT - 2 VIEW  Comparison:  October 11, 2011  Findings: There is no acute fracture or dislocation.  There is plantar calcaneal spur.  There is soft tissue swelling and  deformity in the distal soft tissues of the great toe.  There is no bony changes to suggest osteomyelitis.  IMPRESSION: Soft tissue swelling of the great toe.  No underlying bony abnormality identified.   Original Report Authenticated By: Sherian Rein, M.D.     Scheduled Meds:   . benazepril  10 mg Oral Daily  . hydrochlorothiazide  25 mg Oral Daily  . imipenem-cilastatin  500 mg Intravenous Once  . imipenem-cilastatin  500 mg Intravenous Q6H  . insulin aspart  0-15 Units Subcutaneous TID WC  . insulin aspart  6 Units Subcutaneous TID WC  . insulin glargine  46 Units Subcutaneous QHS  . potassium chloride  20 mEq Oral Daily  . vancomycin  1,250 mg Intravenous Q12H  . vancomycin  1,000 mg Intravenous Once   Continuous Infusions:   . DISCONTD: sodium chloride 50 mL/hr at 08/07/12 2141    Principal Problem:  *DIABETIC FOOT ULCER, TOE Active Problems:  DIABETES MELLITUS, TYPE II  DIABETIC PERIPHERAL NEUROPATHY  HYPERTENSION, BENIGN ESSENTIAL  ASTHMA      [name]  [facility] Pager [phone]. If 8PM-8AM, please contact night-coverage at [email or web address], password [facility] 08/08/2012, 12:27 PM  LOS: 1 day

## 2012-08-08 NOTE — Evaluation (Signed)
I have read and agree with the below assessment and plan.   Shanaiya Bene Helen Whitlow PT, DPT Pager: [phone] 

## 2012-08-08 NOTE — Consult Note (Signed)
Reason for Consult: Ulceration left great toe with purulent drainage. Referring Physician: [name] is an 51 y.o. female.  HPI: Patient is a 51 year old woman with diabetic insensate neuropathy with chronic recurrent ulceration left great toe. Patient works standing on her feet working in [employer].  Past Medical History  Diagnosis Date  . Diabetes mellitus   . Hypertension     History reviewed. No pertinent past surgical history.  History reviewed. No pertinent family history.  Social History:  reports that she has never smoked. She does not have any smokeless tobacco history on file. She reports that she does not drink alcohol. Her drug history not on file.  Allergies:  Allergies  Allergen Reactions  . Aspirin Nausea And Vomiting  . Ibuprofen Nausea And Vomiting and Other (See Comments)    abd pain  . Penicillins Other (See Comments)    unknown    Medications: I have reviewed the patient's current medications.  Results for orders placed during the hospital encounter of 08/07/12 (from the past 48 hour(s))  GLUCOSE, CAPILLARY     Status: Abnormal   Collection Time   08/07/12  1:19 PM      Component Value Range Comment   Glucose-Capillary 185 (*) 70 - 99 mg/dL    Comment 1 Notify RN     CBC WITH DIFFERENTIAL     Status: Abnormal   Collection Time   08/07/12  3:04 PM      Component Value Range Comment   WBC 8.7  4.0 - 10.5 K/uL    RBC 3.83 (*) 3.87 - 5.11 MIL/uL    Hemoglobin 12.3  12.0 - 15.0 g/dL    HCT 40.9  81.1 - 91.4 %    MCV 95.3  78.0 - 100.0 fL    MCH 32.1  26.0 - 34.0 pg    MCHC 33.7  30.0 - 36.0 g/dL    RDW 78.2  95.6 - 21.3 %    Platelets 259  150 - 400 K/uL    Neutrophils Relative 63  43 - 77 %    Neutro Abs 5.5  1.7 - 7.7 K/uL    Lymphocytes Relative 30  12 - 46 %    Lymphs Abs 2.6  0.7 - 4.0 K/uL    Monocytes Relative 6  3 - 12 %    Monocytes Absolute 0.5  0.1 - 1.0 K/uL    Eosinophils Relative 1  0 - 5 %    Eosinophils Absolute 0.1   0.0 - 0.7 K/uL    Basophils Relative 0  0 - 1 %    Basophils Absolute 0.0  0.0 - 0.1 K/uL   COMPREHENSIVE METABOLIC PANEL     Status: Abnormal   Collection Time   08/07/12  3:04 PM      Component Value Range Comment   Sodium 137  135 - 145 mEq/L    Potassium 4.6  3.5 - 5.1 mEq/L    Chloride 99  96 - 112 mEq/L    CO2 27  19 - 32 mEq/L    Glucose, Bld 206 (*) 70 - 99 mg/dL    BUN 19  6 - 23 mg/dL    Creatinine, Ser 0.86 (*) 0.50 - 1.10 mg/dL    Calcium 9.9  8.4 - 57.8 mg/dL    Total Protein 8.3  6.0 - 8.3 g/dL    Albumin 4.0  3.5 - 5.2 g/dL    AST 21  0 - 37 U/L  ALT 34  0 - 35 U/L    Alkaline Phosphatase 81  39 - 117 U/L    Total Bilirubin 0.3  0.3 - 1.2 mg/dL    GFR calc non Af Amer 56 (*) >90 mL/min    GFR calc Af Amer 65 (*) >90 mL/min   GLUCOSE, CAPILLARY     Status: Abnormal   Collection Time   08/07/12  8:27 PM      Component Value Range Comment   Glucose-Capillary 308 (*) 70 - 99 mg/dL   GLUCOSE, CAPILLARY     Status: Abnormal   Collection Time   08/07/12  9:50 PM      Component Value Range Comment   Glucose-Capillary 239 (*) 70 - 99 mg/dL   CBC     Status: Abnormal   Collection Time   08/08/12  5:25 AM      Component Value Range Comment   WBC 5.7  4.0 - 10.5 K/uL    RBC 3.52 (*) 3.87 - 5.11 MIL/uL    Hemoglobin 11.0 (*) 12.0 - 15.0 g/dL    HCT 47.8 (*) 29.5 - 46.0 %    MCV 95.7  78.0 - 100.0 fL    MCH 31.3  26.0 - 34.0 pg    MCHC 32.6  30.0 - 36.0 g/dL    RDW 62.1  30.8 - 65.7 %    Platelets 235  150 - 400 K/uL   COMPREHENSIVE METABOLIC PANEL     Status: Abnormal (Preliminary result)   Collection Time   08/08/12  5:25 AM      Component Value Range Comment   Sodium 138  135 - 145 mEq/L    Potassium 4.2  3.5 - 5.1 mEq/L    Chloride 102  96 - 112 mEq/L    CO2 29  19 - 32 mEq/L    Glucose, Bld 229 (*) 70 - 99 mg/dL    BUN 15  6 - 23 mg/dL    Creatinine, Ser PENDING  0.50 - 1.10 mg/dL    Calcium 9.3  8.4 - 84.6 mg/dL    Total Protein 7.0  6.0 - 8.3 g/dL     Albumin 3.2 (*) 3.5 - 5.2 g/dL    AST 18  0 - 37 U/L    ALT 29  0 - 35 U/L    Alkaline Phosphatase 73  39 - 117 U/L    Total Bilirubin 0.2 (*) 0.3 - 1.2 mg/dL    GFR calc non Af Amer PENDING  >90 mL/min    GFR calc Af Amer PENDING  >90 mL/min   PROTIME-INR     Status: Normal   Collection Time   08/08/12  5:25 AM      Component Value Range Comment   Prothrombin Time 14.5  11.6 - 15.2 seconds    INR 1.11  0.00 - 1.49     Dg Foot 2 Views Left  08/07/2012  *RADIOLOGY REPORT*  Clinical Data: Pain and swelling of the great toe.  LEFT FOOT - 2 VIEW  Comparison:  October 11, 2011  Findings: There is no acute fracture or dislocation.  There is plantar calcaneal spur.  There is soft tissue swelling and deformity in the distal soft tissues of the great toe.  There is no bony changes to suggest osteomyelitis.  IMPRESSION: Soft tissue swelling of the great toe.  No underlying bony abnormality identified.   Original Report Authenticated By: Sherian Rein, M.D.     ROS Blood pressure  113/69, pulse 85, temperature 97.9 F (36.6 C), temperature source Oral, resp. rate 18, height 6\' 1"  (1.854 m), weight 131.407 kg (289 lb 11.2 oz), SpO2 100.00%. Physical Exam On examination patient has good dorsalis pedis pulse. She has a Wagner grade 3 ulcer over the left great toe. After informed consent scissors and hemostats were used for sharp excisional debridement with excisional debridement of skin and soft tissue. The wound does probe to bone. The wound is 1 cm in diameter and 1 cm deep. This does probe to bone. 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  Plan: Discussed that this is worth trying to see if the IV antibiotics followed by by mouth antibiotics to eradicate the infection. Discussed that she has less than a 50-50 chance of the antibiotics being helpful to resolve the infection of the great toe. We will start dial soap cleansing of the great toe daily continue IV  antibiotics with discharge on by mouth antibiotics. Cultures were obtained today to identify proper antibiotics for discharge. I will followup in the office 2 weeks.  Excisional debridement of skin and soft tissue of the ulcer left great toe  Debra Mccarver V 08/08/2012, 7:48 AM

## 2012-08-08 NOTE — Progress Notes (Signed)
Orthopedic Tech Progress Note Patient Details:  [name] 06-10-1961 [id number]  Ortho Devices Type of Ortho Device: Other (comment) (darco shoe) Ortho Device/Splint Location: (L) LE Ortho Device/Splint Interventions: Application   [name] 08/08/2012, 2:29 PM

## 2012-08-08 NOTE — Evaluation (Signed)
Physical Therapy Evaluation Patient Details Name: [name] MRN: [id number] DOB: 1961/02/20 Today's Date: 08/08/2012 Time: 1359-1440 PT Time Calculation (min): 41 min  PT Assessment / Plan / Recommendation Clinical Impression  Pt is a 51 yo female admitted for I&D of left great toe diabetic ulcer. Pt presents to physical therapy with decreased mobility secondary to wearing Darco shoe and will benefit from skilled physical therapy in the acute care setting to maximize independence prior to discharge home. Recommended pt use RW to improve gait with Darco shoe and pt verbalized understanding. Also educated pt on importance of frequent foot checks due to decreased sensation and pt verbalized understanding. Recommending no PT follow up.    PT Assessment  Patient needs continued PT services    Follow Up Recommendations  No PT follow up;Supervision for mobility/OOB    Barriers to Discharge Inaccessible home environment bedroom on second floor    Equipment Recommendations  Rolling walker with 5" wheels    Recommendations for Other Services     Frequency Min 2X/week    Precautions / Restrictions Precautions Precautions: Fall Precaution Comments: Pt with Darco shoe on L foot. Required Braces or Orthoses: Other Brace/Splint Other Brace/Splint: Darco shoe on L foot Restrictions Weight Bearing Restrictions: Yes LLE Weight Bearing: Weight bearing as tolerated         Mobility  Bed Mobility Bed Mobility: Not assessed Details for Bed Mobility Assistance: Pt in bathroom upon presentation. Transfers Transfers: Sit to Stand;Stand to Sit Sit to Stand: 4: Min guard Stand to Sit: 4: Min guard Details for Transfer Assistance: Pt required verbal cueing for safe hand placement and min guard for safety due to Darco shoe causing decreased balance. Ambulation/Gait Ambulation/Gait Assistance: 4: Min guard Ambulation Distance (Feet): 150 Feet Assistive device: Rolling walker Ambulation/Gait  Assistance Details: Pt initially attempted ambulation without RW and pt reported feeling very unsafe/unsteady, ambulating 20 feet. Once pt ambulating with RW, much more stable and pt reports feeling more secure. Also no LOB with RW. Gait Pattern: Step-to pattern;Decreased step length - right;Decreased stance time - left;Decreased stride length;Antalgic Gait velocity: decreased gait speed Stairs: Yes Stairs Assistance: 4: Min guard;4: Min assist (with R HHA) Stairs Assistance Details (indicate cue type and reason): Pt ascended flight of stairs with both hands on right rail with min guard and then descended stairs using left rail and HHA on right with min assist. Pt required verbal sequencing cues for stair navigation. Stair Management Technique: One rail Right;Sideways;Forwards;Step to pattern Number of Stairs: 12     Exercises     PT Diagnosis: Difficulty walking;Abnormality of gait  PT Problem List: Decreased activity tolerance;Decreased balance;Decreased mobility;Decreased knowledge of use of DME PT Treatment Interventions: DME instruction;Gait training;Stair training;Functional mobility training;Therapeutic activities;Therapeutic exercise;Balance training;Neuromuscular re-education;Patient/family education   PT Goals Acute Rehab PT Goals PT Goal Formulation: With patient Time For Goal Achievement: 08/22/12 Potential to Achieve Goals: Good Pt will go Sit to Stand: with modified independence PT Goal: Sit to Stand - Progress: Goal set today Pt will go Stand to Sit: with modified independence PT Goal: Stand to Sit - Progress: Goal set today Pt will Ambulate: >150 feet;with modified independence;with least restrictive assistive device;Other (comment) (with Darco shoe) PT Goal: Ambulate - Progress: Goal set today Pt will Go Up / Down Stairs: Flight;with rail(s);with supervision PT Goal: Up/Down Stairs - Progress: Goal set today  Visit Information  Last PT Received On: 08/08/12 Assistance  Needed: +1    Subjective Data  Subjective: "I am doing great."  Prior Functioning  Home Living Lives With: Spouse;Daughter Available Help at Discharge: Available 24 hours/day;Family Type of Home: House Home Access: Stairs to enter [employer] of Steps: 1 Entrance Stairs-Rails: None Home Layout: Bed/bath upstairs;Two level Alternate Level Stairs-Number of Steps: Flight Bathroom Shower/Tub: Network engineer: None Prior Function Level of Independence: Independent Able to Take Stairs?: Yes Driving: No Vocation: Part time employment Comments: Pt works in Futures trader (standing for 4 hours). Communication Communication: No difficulties    Cognition  Overall Cognitive Status: Appears within functional limits for tasks assessed/performed Arousal/Alertness: Awake/alert Orientation Level: Appears intact for tasks assessed Behavior During Session: [facility] for tasks performed    Extremity/Trunk Assessment Right Upper Extremity Assessment RUE ROM/Strength/Tone: [facility] for tasks assessed Left Upper Extremity Assessment LUE ROM/Strength/Tone: WFL for tasks assessed Right Lower Extremity Assessment RLE ROM/Strength/Tone: WFL for tasks assessed RLE Sensation: Deficits;WFL - Light Touch RLE Sensation Deficits: Pt reports decreased sensation in toes, which was present pre admission. Left Lower Extremity Assessment LLE ROM/Strength/Tone: WFL for tasks assessed LLE Sensation: Deficits LLE Sensation Deficits: Pt reports decreased sensation in toes, which was present pre admission. Trunk Assessment Trunk Assessment: Normal   Balance Balance Balance Assessed: No  End of Session PT - End of Session Equipment Utilized During Treatment: Gait belt;Other (comment) (Darco shoe) Activity Tolerance: Patient tolerated treatment well Patient left: in chair;with call bell/phone within reach Nurse Communication: Mobility status  GP      [name] 08/08/2012, 3:29 PM

## 2012-08-08 NOTE — Consult Note (Signed)
Reason for Consult:  Infection left great toe Referring Physician:  [facility]  [name] is an 51 y.o. female.  HPI:   51 yo diabetic female who was a patient of [facility].  She recently started to have drainage from a left great toes wound.  This has happened before, but not to this extent and did heal in the past.  She came to the ER last night with purulent drainage from this toes.  Past Medical History  Diagnosis Date  . Diabetes mellitus   . Hypertension     History reviewed. No pertinent past surgical history.  History reviewed. No pertinent family history.  Social History:  reports that she has never smoked. She does not have any smokeless tobacco history on file. She reports that she does not drink alcohol. Her drug history not on file.  Allergies:  Allergies  Allergen Reactions  . Aspirin Nausea And Vomiting  . Ibuprofen Nausea And Vomiting and Other (See Comments)    abd pain  . Penicillins Other (See Comments)    unknown    Medications: I have reviewed the patient's current medications.  Results for orders placed during the hospital encounter of 08/07/12 (from the past 48 hour(s))  GLUCOSE, CAPILLARY     Status: Abnormal   Collection Time   08/07/12  1:19 PM      Component Value Range Comment   Glucose-Capillary 185 (*) 70 - 99 mg/dL    Comment 1 Notify RN     CBC WITH DIFFERENTIAL     Status: Abnormal   Collection Time   08/07/12  3:04 PM      Component Value Range Comment   WBC 8.7  4.0 - 10.5 K/uL    RBC 3.83 (*) 3.87 - 5.11 MIL/uL    Hemoglobin 12.3  12.0 - 15.0 g/dL    HCT 45.4  09.8 - 11.9 %    MCV 95.3  78.0 - 100.0 fL    MCH 32.1  26.0 - 34.0 pg    MCHC 33.7  30.0 - 36.0 g/dL    RDW 14.7  82.9 - 56.2 %    Platelets 259  150 - 400 K/uL    Neutrophils Relative 63  43 - 77 %    Neutro Abs 5.5  1.7 - 7.7 K/uL    Lymphocytes Relative 30  12 - 46 %    Lymphs Abs 2.6  0.7 - 4.0 K/uL    Monocytes Relative 6  3 - 12 %    Monocytes  Absolute 0.5  0.1 - 1.0 K/uL    Eosinophils Relative 1  0 - 5 %    Eosinophils Absolute 0.1  0.0 - 0.7 K/uL    Basophils Relative 0  0 - 1 %    Basophils Absolute 0.0  0.0 - 0.1 K/uL   COMPREHENSIVE METABOLIC PANEL     Status: Abnormal   Collection Time   08/07/12  3:04 PM      Component Value Range Comment   Sodium 137  135 - 145 mEq/L    Potassium 4.6  3.5 - 5.1 mEq/L    Chloride 99  96 - 112 mEq/L    CO2 27  19 - 32 mEq/L    Glucose, Bld 206 (*) 70 - 99 mg/dL    BUN 19  6 - 23 mg/dL    Creatinine, Ser 1.30 (*) 0.50 - 1.10 mg/dL    Calcium 9.9  8.4 - 86.5 mg/dL  Total Protein 8.3  6.0 - 8.3 g/dL    Albumin 4.0  3.5 - 5.2 g/dL    AST 21  0 - 37 U/L    ALT 34  0 - 35 U/L    Alkaline Phosphatase 81  39 - 117 U/L    Total Bilirubin 0.3  0.3 - 1.2 mg/dL    GFR calc non Af Amer 56 (*) >90 mL/min    GFR calc Af Amer 65 (*) >90 mL/min   GLUCOSE, CAPILLARY     Status: Abnormal   Collection Time   08/07/12  8:27 PM      Component Value Range Comment   Glucose-Capillary 308 (*) 70 - 99 mg/dL   GLUCOSE, CAPILLARY     Status: Abnormal   Collection Time   08/07/12  9:50 PM      Component Value Range Comment   Glucose-Capillary 239 (*) 70 - 99 mg/dL     Dg Foot 2 Views Left  08/07/2012  *RADIOLOGY REPORT*  Clinical Data: Pain and swelling of the great toe.  LEFT FOOT - 2 VIEW  Comparison:  October 11, 2011  Findings: There is no acute fracture or dislocation.  There is plantar calcaneal spur.  There is soft tissue swelling and deformity in the distal soft tissues of the great toe.  There is no bony changes to suggest osteomyelitis.  IMPRESSION: Soft tissue swelling of the great toe.  No underlying bony abnormality identified.   Original Report Authenticated By: Sherian Rein, M.D.     ROS Blood pressure 113/69, pulse 85, temperature 97.9 F (36.6 C), temperature source Oral, resp. rate 18, height 6\' 1"  (1.854 m), weight 131.407 kg (289 lb 11.2 oz), SpO2 100.00%. Physical Exam    Musculoskeletal:       Feet:    Assessment/Plan: Left great toe infection in a diabetic female 1)  This will need a formal irrigation and debridement in the OR today.  I explained this in detail to her.  Although the x-rays show no evidence of osteo, she may eventually loose her toe.  Today will just be an I&D as the extent of her infection is assessed. 2)  NPO; obtain consent (the risks and benefits of surgery were explained thoroughly)  BLACKMAN,CHRISTOPHER Y 08/08/2012, 6:35 AM

## 2012-08-08 NOTE — ED Provider Notes (Addendum)
See my full evaluation History/physical exam/procedure(s) were performed by non-physician practitioner and as supervising physician I was immediately available for consultation/collaboration. I have reviewed all notes and am in agreement with care and plan.   Hilario Quarry, MD 08/08/12 1625   Patient is a 51 year old African American female with a past medical history of diabetes and hypertension, who presented to the ED for a toe infection to her right big toes.  . The patient for the past 3 days she has noticed that there has been a pinkish discharge from her left great toe. She has had associated fever with chills,  this is mostly subjective and did not actually measure it. She claims that after she took a Tylenol she starts sweating. Since the symptoms were persistent she decided to present to the ED for further evaluation and treatment. She denies any headache, she denies any chest pain. She denies any shortness of breath. She denies any abdominal pain with nausea of vomiting. She denies any diarrhea as well. She is now being admitted to the hospitalist service for further evaluation and treatment.   Pmh is significant for dm and hypertension  Ros  Positive for toe wound, fever, chills, all other systems reviewed and negative.  PE MOrbidly obese female hr 102 ow normal vs. Heent- normocephalic atraumatic Neck- supple cv- mild tachycardia, regular rhythm Abdomen, soft nontender Extremities- draining ulcer right great toe. dp pulsed intact sking- as per extrmity exam. neur0- awake and alert no focal abnormalities Psych- normal interaction.   Patient to have iv abx, x-rays, and labs and admission for diabetic wound.  Discussed with Felicie Morn, np and patient moved to cdu.   Hilario Quarry, MD 11/24/12 [phone]

## 2012-08-09 LAB — GLUCOSE, CAPILLARY

## 2012-08-09 MED ORDER — DEXTROSE 5 % IV SOLN: 1.0000 g | INTRAVENOUS | Status: DC

## 2012-08-09 MED ORDER — INSULIN REGULAR HUMAN 100 UNIT/ML IJ SOLN: 15.0000 [IU] | Freq: Three times a day (TID) | INTRAMUSCULAR | Status: DC

## 2012-08-09 MED ORDER — VANCOMYCIN HCL 1000 MG IV SOLR: 1250.0000 mg | INTRAVENOUS | Status: DC

## 2012-08-09 MED ORDER — SODIUM CHLORIDE 0.9 % IJ SOLN: 10.0000 mL | INTRAMUSCULAR | Status: DC | PRN

## 2012-08-09 MED ORDER — INSULIN GLARGINE 100 UNIT/ML ~~LOC~~ SOLN: 60.0000 [IU] | Freq: Every day | SUBCUTANEOUS | Status: DC

## 2012-08-09 NOTE — Progress Notes (Signed)
Peripherally Inserted Central Catheter/Midline Placement  The IV Nurse has discussed with the patient and/or persons authorized to consent for the patient, the purpose of this procedure and the potential benefits and risks involved with this procedure.  The benefits include less needle sticks, lab draws from the catheter and patient may be discharged home with the catheter.  Risks include, but not limited to, infection, bleeding, blood clot (thrombus formation), and puncture of an artery; nerve damage and irregular heat beat.  Alternatives to this procedure were also discussed.  PICC/Midline Placement Documentation  PICC / Midline Single Lumen 08/09/12 PICC Left Basilic (Active)       Stacie Glaze Horton 08/09/2012, 2:44 PM

## 2012-08-09 NOTE — Progress Notes (Signed)
I have read and agree with the below d/c from PT services. Ivonne Andrew PT, DPT Pager: [phone]

## 2012-08-09 NOTE — Progress Notes (Signed)
Discharge instructions/Med Rec Sheet reviewed w/ pt. Pt expressed understanding and copies given w/ prescriptions. Pt d/c'd in stable condition via w/c, accompanied by NT  

## 2012-08-09 NOTE — Progress Notes (Signed)
Physical Therapy Treatment Patient Details Name: [name] MRN: [id number] DOB: July 19, 1961 Today's Date: 08/09/2012 Time: 0950-1009 PT Time Calculation (min): 19 min  PT Assessment / Plan / Recommendation Comments on Treatment Session  Patient met goals. Will discharge PT at this time    Follow Up Recommendations  No PT follow up;Supervision for mobility/OOB    Barriers to Discharge        Equipment Recommendations  Rolling walker with 5" wheels    Recommendations for Other Services    Frequency     Plan All goals met and education completed, patient dischaged from PT services    Precautions / Restrictions Precautions Precautions: None Required Braces or Orthoses: Other Brace/Splint Other Brace/Splint: L Darco shoe Restrictions LLE Weight Bearing: Weight bearing as tolerated   Pertinent Vitals/Pain     Mobility  Transfers Sit to Stand: 6: Modified independent (Device/Increase time) Stand to Sit: 6: Modified independent (Device/Increase time) Ambulation/Gait Ambulation/Gait Assistance: 6: Modified independent (Device/Increase time) Ambulation Distance (Feet): 200 Feet Assistive device: Rolling walker Ambulation/Gait Assistance Details: Good safe use of RW with good speed and safety awareness Gait Pattern: Step-through pattern;Decreased stride length Stairs Assistance: 6: Modified independent (Device/Increase time) Stair Management Technique: One rail Right;Step to pattern;Sideways Number of Stairs: 12     Exercises     PT Diagnosis:    PT Problem List:   PT Treatment Interventions:     PT Goals Acute Rehab PT Goals PT Goal: Sit to Stand - Progress: Met PT Goal: Stand to Sit - Progress: Met PT Goal: Ambulate - Progress: Met PT Goal: Up/Down Stairs - Progress: Met  Visit Information  Last PT Received On: 08/09/12 Assistance Needed: +1    Subjective Data      Cognition  Overall Cognitive Status: Appears within functional limits for tasks  assessed/performed Arousal/Alertness: Awake/alert Orientation Level: Appears intact for tasks assessed Behavior During Session: [facility] for tasks performed    Balance     End of Session PT - End of Session Equipment Utilized During Treatment: Gait belt;Other (comment) (darco boot) Activity Tolerance: Patient tolerated treatment well Patient left: in bed;with call bell/phone within reach Nurse Communication: Mobility status   GP     Fredrich Birks 08/09/2012, 11:52 AM 08/09/2012 Fredrich Birks PTA [phone] pager [phone] office

## 2012-08-09 NOTE — Discharge Summary (Signed)
Physician Discharge Summary  [name] ZOX:[id number] DOB: 1961-07-16 DOA: 08/07/2012  PCP: Pcp Not In System  Admit date: 08/07/2012 Discharge date: 08/09/2012  Recommendations for Outpatient Follow-up:  1. Patient will f/u with Dr. Lajoyce Corners for further management   Discharge Diagnoses:  DIABETIC FOOT ULCER, TOE - probing to bone s/p bedside debridement by dr. Lajoyce Corners - culture growing Citrobacter sensitive to Rocephin   DIABETES MELLITUS, TYPE II  DIABETIC PERIPHERAL NEUROPATHY  HYPERTENSION, BENIGN ESSENTIAL  ASTHMA   Discharge Condition: good, PICC line in place for iv antibiotics.   Diet recommendation: diabetic   Filed Weights   08/07/12 1922 08/08/12 0614 08/09/12 0500  Weight: 132.45 kg (292 lb) 131.407 kg (289 lb 11.2 oz) 132.6 kg (292 lb 5.3 oz)    History of present illness:  Patient is a very pleasant 51 year old [place] American female with a past medical history of diabetes and hypertension, who presented to the ED for the above-noted complaints. The patient for the past 3 days she has noticed that there has been a pinkish discharge from her left great toe. She claims to have at associated fever with chills, she claims that this is mostly subjective and did not actually measure it. She claims that after she took a Tylenol she starts sweating. Since the symptoms were persistent she decided to present to the ED for further evaluation and treatment. She denies any headache, she denies any chest pain. She denies any shortness of breath. She denies any abdominal pain with nausea of vomiting. She denies any diarrhea as well. She is now being admitted to the hospitalist service for further evaluation and treatment.      Hospital Course:  1. Diabetic foot ulcer Wagner 3 - s/p I and D at bedside by Dr. Lajoyce Corners on 9/11.13 - plan for 2 weeks of iv Vanc and Rocephin  as outpatient and then f/u in office with him.  2. DM 2 - fair control. Continued on lantus during amdission and prescribed  regular insulin with meals at DC  3. HTn - resumed home meds      Procedures:  PICC  I and D   Consultations:  Dr. Lajoyce Corners  Discharge Exam: Filed Vitals:   08/08/12 0614 08/08/12 1409 08/08/12 2100 08/09/12 0500  BP: 113/69 102/57 124/63 119/55  Pulse: 85 84 73 62  Temp: 97.9 F (36.6 C) 98.1 F (36.7 C) 98 F (36.7 C) 97.4 F (36.3 C)  TempSrc: Oral Oral Oral Oral  Resp: 18 20 18 16   Height:      Weight: 131.407 kg (289 lb 11.2 oz)   132.6 kg (292 lb 5.3 oz)  SpO2: 100% 96% 100% 99%    General: axox3 Cardiovascular: rrr Respiratory: ctab   Discharge Instructions  Discharge Orders    Future Orders Please Complete By Expires   Diet Carb Modified      Increase activity slowly          Medication List     As of 08/09/2012  3:00 PM    STOP taking these medications         insulin aspart protamine-insulin aspart (70-30) 100 UNIT/ML injection   Commonly known as: NOVOLOG 70/30      TAKE these medications         benazepril 10 MG tablet   Commonly known as: LOTENSIN   Take 10 mg by mouth daily.      dextrose 5 % SOLN 50 mL with cefTRIAXone 1 G SOLR 1 g  Inject 1 g into the vein daily.      hydrochlorothiazide 25 MG tablet   Commonly known as: HYDRODIURIL   Take 25 mg by mouth daily.      insulin glargine 100 UNIT/ML injection   Commonly known as: LANTUS   Inject 60 Units into the skin at bedtime.      insulin regular 100 units/mL injection   Commonly known as: NOVOLIN R,HUMULIN R   Inject 0.15 mLs (15 Units total) into the skin 3 (three) times daily before meals.      metFORMIN 1000 MG tablet   Commonly known as: GLUCOPHAGE   Take 1,000 mg by mouth 2 (two) times daily with a meal.      potassium chloride 10 MEQ tablet   Commonly known as: K-DUR   Take 20 mEq by mouth daily.      sodium chloride 0.9 % SOLN 250 mL with vancomycin 1000 MG SOLR 1,250 mg   Inject 1,250 mg into the vein daily.           Follow-up Information    Follow up  with DUDA,MARCUS V, MD. Schedule an appointment as soon as possible for a visit in 2 weeks.   Contact information:   [address] [place] [address] [phone]           The results of significant diagnostics from this hospitalization (including imaging, microbiology, ancillary and laboratory) are listed below for reference.    Significant Diagnostic Studies: Dg Foot 2 Views Left  08/07/2012  *RADIOLOGY REPORT*  Clinical Data: Pain and swelling of the great toe.  LEFT FOOT - 2 VIEW  Comparison:  October 11, 2011  Findings: There is no acute fracture or dislocation.  There is plantar calcaneal spur.  There is soft tissue swelling and deformity in the distal soft tissues of the great toe.  There is no bony changes to suggest osteomyelitis.  IMPRESSION: Soft tissue swelling of the great toe.  No underlying bony abnormality identified.   Original Report Authenticated By: Sherian Rein, M.D.     Microbiology: Recent Results (from the past 240 hour(s))  WOUND CULTURE     Status: Normal (Preliminary result)   Collection Time   08/08/12  8:22 AM      Component Value Range Status Comment   Specimen Description WOUND RIGHT TOE   Final    Special Requests RIGHT BIG TOE   Final    Gram Stain     Final    Value: RARE WBC PRESENT,BOTH PMN AND MONONUCLEAR     NO SQUAMOUS EPITHELIAL CELLS SEEN     RARE GRAM NEGATIVE RODS     RARE GRAM POSITIVE COCCI     IN PAIRS   Culture MODERATE PROTEUS SPECIES   Final    Report Status PENDING   Incomplete      Labs: Basic Metabolic Panel:  Lab 08/08/12 9147 08/07/12 1504  NA 138 137  K 4.2 4.6  CL 102 99  CO2 29 27  GLUCOSE 229* 206*  BUN 15 19  CREATININE 0.70 1.12*  CALCIUM 9.3 9.9  MG -- --  PHOS -- --   Liver Function Tests:  Lab 08/08/12 0525 08/07/12 1504  AST 18 21  ALT 29 34  ALKPHOS 73 81  BILITOT 0.2* 0.3  PROT 7.0 8.3  ALBUMIN 3.2* 4.0   No results found for this basename: LIPASE:5,AMYLASE:5 in the last 168  hours No results found for this basename: AMMONIA:5 in the  last 168 hours CBC:  Lab 08/08/12 0525 08/07/12 1504  WBC 5.7 8.7  NEUTROABS -- 5.5  HGB 11.0* 12.3  HCT 33.7* 36.5  MCV 95.7 95.3  PLT 235 259   Cardiac Enzymes: No results found for this basename: CKTOTAL:5,CKMB:5,CKMBINDEX:5,TROPONINI:5 in the last 168 hours BNP: BNP (last 3 results) No results found for this basename: PROBNP:3 in the last 8760 hours CBG:  Lab 08/09/12 1257 08/09/12 0803 08/08/12 2135 08/08/12 1631 08/08/12 1206  GLUCAP 220* 194* 205* 281* 176*    Time coordinating discharge:  Signed:  Daleyza Gadomski  [facility] 08/09/2012, 3:00 PM

## 2012-08-09 NOTE — Progress Notes (Signed)
Inpatient Diabetes Program Recommendations  AACE/ADA: New Consensus Statement on Inpatient Glycemic Control (2013)  Target Ranges:  Prepandial:   less than 140 mg/dL      Peak postprandial:   less than 180 mg/dL (1-2 hours)      Critically ill patients:  140 - 180 mg/dL   Reason for Visit: Hyperglycemia    Inpatient Diabetes Program Recommendations Correction (SSI): Noted change in orders to fixed doses of 70/30 and Lantus.  Appears to need correction as well, ar least until control doses are established.  Note: Thank you, Lenor Coffin, RN, CNS, Diabetes Coordinator [phone])

## 2012-08-10 LAB — WOUND CULTURE

## 2013-01-14 ENCOUNTER — Encounter (HOSPITAL_COMMUNITY): Payer: Self-pay

## 2013-01-14 ENCOUNTER — Emergency Department (INDEPENDENT_AMBULATORY_CARE_PROVIDER_SITE_OTHER)
Admission: EM | Admit: 2013-01-14 | Discharge: 2013-01-14 | Disposition: A | Discharge status: Home / Self Care | Payer: No Typology Code available for payment source | Source: Home / Self Care | Attending: Family Medicine | Admitting: Family Medicine

## 2013-01-14 DIAGNOSIS — I10 Essential (primary) hypertension: Secondary | ICD-10-CM

## 2013-01-14 DIAGNOSIS — E1149 Type 2 diabetes mellitus with other diabetic neurological complication: Secondary | ICD-10-CM

## 2013-01-14 DIAGNOSIS — L97509 Non-pressure chronic ulcer of other part of unspecified foot with unspecified severity: Secondary | ICD-10-CM

## 2013-01-14 DIAGNOSIS — E11621 Type 2 diabetes mellitus with foot ulcer: Secondary | ICD-10-CM

## 2013-01-14 DIAGNOSIS — IMO0001 Reserved for inherently not codable concepts without codable children: Secondary | ICD-10-CM

## 2013-01-14 DIAGNOSIS — J45909 Unspecified asthma, uncomplicated: Secondary | ICD-10-CM

## 2013-01-14 DIAGNOSIS — E1165 Type 2 diabetes mellitus with hyperglycemia: Secondary | ICD-10-CM

## 2013-01-14 LAB — GLUCOSE, CAPILLARY: Glucose-Capillary: 437 mg/dL — ABNORMAL HIGH (ref 70–99)

## 2013-01-14 MED ORDER — INSULIN ASPART 100 UNIT/ML ~~LOC~~ SOLN
12.0000 [IU] | Freq: Once | SUBCUTANEOUS | Status: AC
  Administered 2013-01-14: 12 [IU] via SUBCUTANEOUS

## 2013-01-14 MED ORDER — BENAZEPRIL-HYDROCHLOROTHIAZIDE 10-12.5 MG PO TABS: 1.0000 | ORAL_TABLET | Freq: Every day | ORAL | Status: DC

## 2013-01-14 MED ORDER — INSULIN ASPART 100 UNIT/ML ~~LOC~~ SOLN: 15.0000 [IU] | Freq: Three times a day (TID) | SUBCUTANEOUS | Status: DC

## 2013-01-14 MED ORDER — METFORMIN HCL 1000 MG PO TABS: 1000.0000 mg | ORAL_TABLET | Freq: Two times a day (BID) | ORAL | Status: DC

## 2013-01-14 MED ORDER — DOXYCYCLINE HYCLATE 100 MG PO TABS: 100.0000 mg | ORAL_TABLET | Freq: Two times a day (BID) | ORAL | Status: DC

## 2013-01-14 MED ORDER — INSULIN GLARGINE 100 UNIT/ML ~~LOC~~ SOLN: 50.0000 [IU] | Freq: Every day | SUBCUTANEOUS | Status: DC

## 2013-01-14 NOTE — ED Provider Notes (Signed)
History    CSN: [id number]  Arrival date & time 01/14/13  1606   First MD Initiated Contact with Patient 01/14/13 1717     Chief Complaint  Patient presents with  . Diabetes  . Hypertension  . Knee Pain  . Wound Infection   HPI  for over 1 month.  She has hyperglycemia and a nonhealing wound involving the left toe.  She says that she was admitted into the hospital for this some time ago in September 2013.  The patient was discharged from the hospital and reports that she never did followup with the surgeon Dr. Lajoyce Corners because she could not afford to pay for office visits.  She says that she also never followed up for primary care as arranged at discharge.  She also reports that she has been out of her medications for about a month.  The patient reports that she has been caring for her injured toe by herself.  She reports that she completed a course of IV antibiotics for 14 days.  This was arranged at her discharge from the hospital in September 2013.  She said that she had a PICC line in place for that.  She also reports that she has not been able to test her blood glucose at home. Pt says she has been out of meds for quite some time.  She reports that she's had no fever or chills.  No nausea or vomiting.  She reports that she's continued to stand on her feet for long periods of time because of her job.  Past Medical History  Diagnosis Date  . Diabetes mellitus   . Hypertension     History reviewed. No pertinent past surgical history.  No family history on file.  History  Substance Use Topics  . Smoking status: Never Smoker   . Smokeless tobacco: Not on file  . Alcohol Use: No    OB History   Grav Para Term Preterm Abortions TAB SAB Ect Mult Living                 Review of Systems  Constitutional: Positive for fatigue and unexpected weight change.  Endocrine: Positive for polydipsia and polyuria.  Genitourinary: Positive for frequency.  Skin: Positive for wound.  All other  systems reviewed and are negative.    Allergies  Aspirin; Ibuprofen; and Penicillins  Home Medications   Current Outpatient Rx  Name  Route  Sig  Dispense  Refill  . benazepril (LOTENSIN) 10 MG tablet   Oral   Take 10 mg by mouth daily.         Marland Kitchen dextrose 5 % SOLN 50 mL with cefTRIAXone 1 G SOLR 1 g   Intravenous   Inject 1 g into the vein daily.   14 ampule   0   . hydrochlorothiazide (HYDRODIURIL) 25 MG tablet   Oral   Take 25 mg by mouth daily.         . insulin glargine (LANTUS) 100 UNIT/ML injection   Subcutaneous   Inject 60 Units into the skin at bedtime.   10 mL      . insulin regular (NOVOLIN R,HUMULIN R) 100 units/mL injection   Subcutaneous   Inject 0.15 mLs (15 Units total) into the skin 3 (three) times daily before meals.   10 mL   12   . metFORMIN (GLUCOPHAGE) 1000 MG tablet   Oral   Take 1,000 mg by mouth 2 (two) times daily with a meal.         .  potassium chloride (K-DUR) 10 MEQ tablet   Oral   Take 20 mEq by mouth daily.         . sodium chloride 0.9 % SOLN 250 mL with vancomycin 1000 MG SOLR 1,250 mg   Intravenous   Inject 1,250 mg into the vein daily.   14 ampule   0    BP 136/86  Pulse 63  Temp(Src) 98.4 F (36.9 C) (Oral)  SpO2 100%  Physical Exam  Nursing note and vitals reviewed. Constitutional: She is oriented to person, place, and time. She appears well-developed and well-nourished. No distress.  HENT:  Head: Normocephalic and atraumatic.  Eyes: Conjunctivae and EOM are normal. Pupils are equal, round, and reactive to light.  Neck: Normal range of motion. Neck supple. No JVD present. No tracheal deviation present. No thyromegaly present.  Cardiovascular: Normal rate, regular rhythm and normal heart sounds.   Pulmonary/Chest: Effort normal and breath sounds normal.  Abdominal: Soft. Bowel sounds are normal. She exhibits no distension and no mass. There is no tenderness. There is no rebound and no guarding.    Musculoskeletal: Normal range of motion. She exhibits edema. She exhibits no tenderness.       Feet:  Lymphadenopathy:    She has no cervical adenopathy.  Neurological: She is alert and oriented to person, place, and time.  Skin: Skin is warm and dry. No rash noted. No erythema. No pallor.  Psychiatric: She has a normal mood and affect. Her behavior is normal. Judgment and thought content normal.    ED Course  Procedures (including critical care time)  Labs Reviewed - No data to display No results found.   No diagnosis found.   MDM  IMPRESSION  Left 1st toe ulceration  Uncontrolled diabetes mellitus type 2, insulin requiring  Hypertension  Poor compliance  Hyperglycemia  Glucose toxicity  RECOMMENDATIONS / PLAN Referral to [facility] wound care center.  The patient has been there before and has been a patient care and receive treatment there before.  She likely is going to require debridement of the toe.  It does not appear to be actively infected at this time.  Pt is at High risk for acute and chronic complications of poorly controlled diabetes mellitus.  Pt at high risk for toe amputation.  Pt says she has been to wound care center in the past and now that she has her discount card we can refer her there for further treatment.  Refilled her insulin.  Obtain medical records from [facility].   Check labs today, check CBC with diff Doxycycline 100 mg po bid with food given to empirically start treatment until she can be evaluated by the wound care specialty team The patient was given samples of NovoLog insulin in Levemir insulin to use until she can establish with the health department for assistance with her medications The patient will take 50 units of Levemir daily with  Bolus doses of NovoLog insulin 14 units 3 times a day a.c. Her blood sugar is greater than 400 in the office today.  She was given 12 units of NovoLog in the office today. The patient was strongly  advised to test her blood glucose 4 times per day.  I asked her to go to [employer] to get a Relion glucose meter and strips.  The patient verbalized understanding. Pt is uptodate for flu vaccine  Encouraged patient to check her BS 3-4 times per day  Referral for eye exam   FOLLOW UP 1 month  to reassess glycemic control Patient given instructions to call our office in 2 weeks to report her blood glucose readings  The patient was given clear instructions to go to ER or return to medical center if symptoms don't improve, worsen or new problems develop.  The patient verbalized understanding.  The patient was told to call to get lab results if they haven't heard anything in the next week.            Cleora Fleet, MD 01/14/13 2027

## 2013-01-14 NOTE — ED Notes (Signed)
Patient states has a history of Dm, HTN  Patient complains of pain to left knee- unknown what happened Patient states she has a wound under her left great toe Medication refill

## 2013-01-16 NOTE — ED Notes (Signed)
Referral faxed to [facility] wound care ulcerated left 1st toe appt 2/26 @ 10am

## 2013-01-16 NOTE — ED Notes (Signed)
Referral to eye dr faxed

## 2013-01-19 NOTE — Progress Notes (Signed)
The patient never had her labs done.  She said that she would go to the main lab the very next day to have her labs done.  I placed future orders in [facility].  I will ask staff to call patient to find out why she never went to have her labs done.  This patient has poorly controlled diabetes mellitus with complications and is going to need close medical follow up is she is going to gain control of her disease.  I am concerned because she has a chronic slow healing ulceration on her toe as outlined in my office visit note and will need good glycemic control to help ensure that it heals properly.  We have referred her to wound care clinic and eye care specialist.   Rodney Langton, MD, CDE, FAAFP [facility] [place], [place]

## 2013-01-25 ENCOUNTER — Telehealth (HOSPITAL_COMMUNITY): Payer: Self-pay

## 2013-01-25 ENCOUNTER — Encounter (HOSPITAL_BASED_OUTPATIENT_CLINIC_OR_DEPARTMENT_OTHER): Payer: No Typology Code available for payment source | Attending: General Surgery

## 2013-01-25 DIAGNOSIS — I10 Essential (primary) hypertension: Secondary | ICD-10-CM | POA: Insufficient documentation

## 2013-01-25 DIAGNOSIS — E1169 Type 2 diabetes mellitus with other specified complication: Secondary | ICD-10-CM | POA: Insufficient documentation

## 2013-01-25 DIAGNOSIS — L97509 Non-pressure chronic ulcer of other part of unspecified foot with unspecified severity: Secondary | ICD-10-CM | POA: Insufficient documentation

## 2013-01-25 DIAGNOSIS — Z79899 Other long term (current) drug therapy: Secondary | ICD-10-CM | POA: Insufficient documentation

## 2013-01-25 LAB — GLUCOSE, CAPILLARY: Glucose-Capillary: 177 mg/dL — ABNORMAL HIGH (ref 70–99)

## 2013-01-25 NOTE — Telephone Encounter (Signed)
Message copied by Lestine Mount on Fri Jan 25, 2013 12:04 PM ------      Message from: Cleora Fleet      Created: Sat Jan 19, 2013 11:03 PM      Regarding: Labs       Please call patient and find out why she never went to hospital to have her labs done.  She said that she would be going to the main lab the very next day.  The future orders are in the system.               Rodney Langton, MD, CDE, FAAFP      [facility]      [facility]      [place], [place]        ------

## 2013-01-26 NOTE — Progress Notes (Signed)
[facility]  NAME:  [name], [name]              ACCOUNT NO.:  [id number]  MEDICAL RECORD NO.:  [id number]      DATE OF BIRTH:  1961/08/24  PHYSICIAN:  Ardath Sax, M.D.      VISIT DATE:  01/25/2013                                  OFFICE VISIT   HISTORY:  This is a 52 year old obese lady who enters with a recurrent diabetic foot ulcer on her plantar aspect of her left great toe.  She has had this before and it has successfully been treated with various modes of treatment here at the [facility]ound Clinic.  I classified as a Wagner's III which has been classified before.  Today, I debrided the callus and we put on a collagen dressing, and a toe sock to offload with a Podus boot.  PHYSICAL EXAMINATION:  Today, she was found to have a blood pressure 136/81, respirations 20, pulse 90, temperature 98.6.  She weighs 288 pounds.  MEDICATIONS:  Her medicines include Lantus 60 units at bedtime, Novolin 15 units 3 times a day.  She also takes metformin.  She is on vitamins and potassium chloride, and she is already on doxycycline.  She takes benazepril for her obesity.  She is allergic to penicillin.  DIAGNOSES: 1. Wagner III diabetic foot ulcer, left great toe. 2. Morbid obesity. 3. Hypertension. 4. Type 2 diabetes.     Ardath Sax, M.D.     PP/MEDQ  D:  01/25/2013  T:  01/26/2013  Job:  [id number]

## 2013-01-29 MED ORDER — HYDROCHLOROTHIAZIDE 12.5 MG PO TABS: 12.5000 mg | ORAL_TABLET | Freq: Every day | ORAL | Status: DC

## 2013-01-29 MED ORDER — LISINOPRIL 10 MG PO TABS: 10.0000 mg | ORAL_TABLET | Freq: Every day | ORAL | Status: DC

## 2013-02-01 ENCOUNTER — Encounter (HOSPITAL_BASED_OUTPATIENT_CLINIC_OR_DEPARTMENT_OTHER): Payer: No Typology Code available for payment source | Attending: General Surgery

## 2013-02-01 DIAGNOSIS — E1169 Type 2 diabetes mellitus with other specified complication: Secondary | ICD-10-CM | POA: Insufficient documentation

## 2013-02-01 DIAGNOSIS — E669 Obesity, unspecified: Secondary | ICD-10-CM | POA: Insufficient documentation

## 2013-02-01 DIAGNOSIS — I10 Essential (primary) hypertension: Secondary | ICD-10-CM | POA: Insufficient documentation

## 2013-02-01 DIAGNOSIS — Z794 Long term (current) use of insulin: Secondary | ICD-10-CM | POA: Insufficient documentation

## 2013-02-01 DIAGNOSIS — Z79899 Other long term (current) drug therapy: Secondary | ICD-10-CM | POA: Insufficient documentation

## 2013-02-01 DIAGNOSIS — L97509 Non-pressure chronic ulcer of other part of unspecified foot with unspecified severity: Secondary | ICD-10-CM | POA: Insufficient documentation

## 2013-02-05 MED ORDER — INSULIN NPH ISOPHANE & REGULAR (70-30) 100 UNIT/ML ~~LOC~~ SUSP: 35.0000 [IU] | Freq: Two times a day (BID) | SUBCUTANEOUS | Status: DC

## 2013-02-07 NOTE — ED Notes (Signed)
Has an appt Dr. Dione Booze 03/11/13 @ 2:30 pm $30.00 co pay must be paid at time of service

## 2013-02-09 NOTE — Progress Notes (Signed)
[facility]  NAME:  [name], PEREDA              ACCOUNT NO.:  [id number]  MEDICAL RECORD NO.:  [id number]      DATE OF BIRTH:  [birth year]  PHYSICIAN:  Ardath Sax, M.D.           VISIT DATE:                                  OFFICE VISIT   She returns to the Wound Clinic because of her recurrent problem with her [address] III diabetic foot ulcer to her left great toe.  She has been here many times before.  Today, when she came, her blood sugar was found to be 89, blood pressure 152/84, pulse 100, temperature 98.  She weighed 288 pounds.  She works in [employer] and she is on her feet a great deal.  So, today, I debrided the callus and we are going to treat the wound with silver collagen.  This lady is on many medicines including already on doxycycline by her private doctor.  She is also on metformin and Novolin insulin, Lantus insulin, hydrochlorothiazide, and benazepril.  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  DIAGNOSES: 1. Type 2 diabetes. 2. Hypertension. 3. Obesity. 4. Wagner III diabetic foot ulcer, left great toe.     Ardath Sax, M.D.     PP/MEDQ  D:  02/08/2013  T:  02/09/2013  Job:  [id number]

## 2013-02-17 ENCOUNTER — Inpatient Hospital Stay (HOSPITAL_COMMUNITY)
Admission: EM | Admit: 2013-02-17 | Discharge: 2013-02-20 | DRG: 617 | Disposition: A | Discharge status: Home / Self Care | Payer: No Typology Code available for payment source | Attending: Internal Medicine | Admitting: Internal Medicine

## 2013-02-17 ENCOUNTER — Emergency Department (HOSPITAL_COMMUNITY): Payer: No Typology Code available for payment source

## 2013-02-17 ENCOUNTER — Encounter (HOSPITAL_COMMUNITY): Payer: Self-pay | Admitting: *Deleted

## 2013-02-17 DIAGNOSIS — E1140 Type 2 diabetes mellitus with diabetic neuropathy, unspecified: Secondary | ICD-10-CM | POA: Diagnosis present

## 2013-02-17 DIAGNOSIS — I959 Hypotension, unspecified: Secondary | ICD-10-CM | POA: Diagnosis present

## 2013-02-17 DIAGNOSIS — L97509 Non-pressure chronic ulcer of other part of unspecified foot with unspecified severity: Secondary | ICD-10-CM | POA: Diagnosis present

## 2013-02-17 DIAGNOSIS — K529 Noninfective gastroenteritis and colitis, unspecified: Secondary | ICD-10-CM | POA: Diagnosis not present

## 2013-02-17 DIAGNOSIS — K5289 Other specified noninfective gastroenteritis and colitis: Secondary | ICD-10-CM | POA: Diagnosis present

## 2013-02-17 DIAGNOSIS — L02619 Cutaneous abscess of unspecified foot: Secondary | ICD-10-CM | POA: Diagnosis present

## 2013-02-17 DIAGNOSIS — E1149 Type 2 diabetes mellitus with other diabetic neurological complication: Secondary | ICD-10-CM | POA: Diagnosis present

## 2013-02-17 DIAGNOSIS — Z8739 Personal history of other diseases of the musculoskeletal system and connective tissue: Secondary | ICD-10-CM

## 2013-02-17 DIAGNOSIS — Z79899 Other long term (current) drug therapy: Secondary | ICD-10-CM

## 2013-02-17 DIAGNOSIS — I152 Hypertension secondary to endocrine disorders: Secondary | ICD-10-CM | POA: Diagnosis present

## 2013-02-17 DIAGNOSIS — E11621 Type 2 diabetes mellitus with foot ulcer: Secondary | ICD-10-CM | POA: Diagnosis present

## 2013-02-17 DIAGNOSIS — J45909 Unspecified asthma, uncomplicated: Secondary | ICD-10-CM | POA: Diagnosis present

## 2013-02-17 DIAGNOSIS — M86679 Other chronic osteomyelitis, unspecified ankle and foot: Secondary | ICD-10-CM | POA: Diagnosis present

## 2013-02-17 DIAGNOSIS — I739 Peripheral vascular disease, unspecified: Secondary | ICD-10-CM | POA: Diagnosis present

## 2013-02-17 DIAGNOSIS — E871 Hypo-osmolality and hyponatremia: Secondary | ICD-10-CM | POA: Diagnosis present

## 2013-02-17 DIAGNOSIS — M9080 Osteopathy in diseases classified elsewhere, unspecified site: Secondary | ICD-10-CM | POA: Diagnosis present

## 2013-02-17 DIAGNOSIS — E1142 Type 2 diabetes mellitus with diabetic polyneuropathy: Secondary | ICD-10-CM | POA: Diagnosis present

## 2013-02-17 DIAGNOSIS — E860 Dehydration: Secondary | ICD-10-CM | POA: Diagnosis present

## 2013-02-17 DIAGNOSIS — E1159 Type 2 diabetes mellitus with other circulatory complications: Secondary | ICD-10-CM | POA: Diagnosis present

## 2013-02-17 DIAGNOSIS — E119 Type 2 diabetes mellitus without complications: Secondary | ICD-10-CM | POA: Diagnosis present

## 2013-02-17 DIAGNOSIS — R509 Fever, unspecified: Secondary | ICD-10-CM

## 2013-02-17 DIAGNOSIS — I10 Essential (primary) hypertension: Secondary | ICD-10-CM | POA: Diagnosis present

## 2013-02-17 DIAGNOSIS — M86672 Other chronic osteomyelitis, left ankle and foot: Secondary | ICD-10-CM

## 2013-02-17 DIAGNOSIS — N179 Acute kidney failure, unspecified: Secondary | ICD-10-CM | POA: Diagnosis present

## 2013-02-17 DIAGNOSIS — E1169 Type 2 diabetes mellitus with other specified complication: Principal | ICD-10-CM | POA: Diagnosis present

## 2013-02-17 DIAGNOSIS — M869 Osteomyelitis, unspecified: Secondary | ICD-10-CM

## 2013-02-17 DIAGNOSIS — I498 Other specified cardiac arrhythmias: Secondary | ICD-10-CM | POA: Diagnosis present

## 2013-02-17 DIAGNOSIS — Z794 Long term (current) use of insulin: Secondary | ICD-10-CM

## 2013-02-17 LAB — COMPREHENSIVE METABOLIC PANEL WITH GFR
ALT: 21 U/L (ref 0–35)
AST: 18 U/L (ref 0–37)
Albumin: 3.4 g/dL — ABNORMAL LOW (ref 3.5–5.2)
Alkaline Phosphatase: 77 U/L (ref 39–117)
BUN: 23 mg/dL (ref 6–23)
CO2: 21 meq/L (ref 19–32)
Calcium: 9.3 mg/dL (ref 8.4–10.5)
Chloride: 93 meq/L — ABNORMAL LOW (ref 96–112)
Creatinine, Ser: 1.67 mg/dL — ABNORMAL HIGH (ref 0.50–1.10)
GFR calc Af Amer: 40 mL/min — ABNORMAL LOW (ref >90)
GFR calc non Af Amer: 35 mL/min — ABNORMAL LOW (ref >90)
Glucose, Bld: 246 mg/dL — ABNORMAL HIGH (ref 70–99)
Potassium: 4.2 meq/L (ref 3.5–5.1)
Sodium: 131 meq/L — ABNORMAL LOW (ref 135–145)
Total Bilirubin: 0.5 mg/dL (ref 0.3–1.2)
Total Protein: 7.5 g/dL (ref 6.0–8.3)

## 2013-02-17 LAB — CBC WITH DIFFERENTIAL/PLATELET
Basophils Absolute: 0 10*3/uL (ref 0.0–0.1)
Eosinophils Relative: 0 % (ref 0–5)
Lymphocytes Relative: 14 % (ref 12–46)
Neutro Abs: 6.7 10*3/uL (ref 1.7–7.7)
Neutrophils Relative %: 78 % — ABNORMAL HIGH (ref 43–77)
Platelets: 206 10*3/uL (ref 150–400)
RDW: 13.2 % (ref 11.5–15.5)
WBC: 8.6 10*3/uL (ref 4.0–10.5)

## 2013-02-17 LAB — POCT I-STAT TROPONIN I: Troponin i, poc: 0 ng/mL (ref 0.00–0.08)

## 2013-02-17 MED ORDER — VANCOMYCIN HCL IN DEXTROSE 1-5 GM/200ML-% IV SOLN
1000.0000 mg | Freq: Once | INTRAVENOUS | Status: AC
  Administered 2013-02-18: 1000 mg via INTRAVENOUS
  Filled 2013-02-17: qty 200

## 2013-02-17 MED ORDER — LEVOFLOXACIN IN D5W 500 MG/100ML IV SOLN
500.0000 mg | Freq: Once | INTRAVENOUS | Status: AC
  Administered 2013-02-18: 500 mg via INTRAVENOUS
  Filled 2013-02-17: qty 100

## 2013-02-17 MED ORDER — SODIUM CHLORIDE 0.9 % IV BOLUS (SEPSIS)
1000.0000 mL | Freq: Once | INTRAVENOUS | Status: AC
  Administered 2013-02-18: 1000 mL via INTRAVENOUS

## 2013-02-17 NOTE — ED Notes (Signed)
Pt states that she has been feeling bad for the past week pt states that she has not been able to eat or drink well for the past week. Pt very tried in triage, unable to full form a thought or talk. Pt swaying in wheelchair and unable to say if she is in pain. Pt daiphoretic and has been having chills and fever for a few days.

## 2013-02-17 NOTE — ED Notes (Signed)
IV team at bedside attempting peripheral IV at this time.

## 2013-02-17 NOTE — ED Provider Notes (Signed)
History     CSN: [id number]  Arrival date & time 02/17/13  2214   First MD Initiated Contact with Patient 02/17/13 2300      Chief Complaint  Patient presents with  . Fatigue    (Consider location/radiation/quality/duration/timing/severity/associated sxs/prior treatment) HPI History provided by the patient. Sick for the last 5 days with N/V/D and now fever and generalized weakness and body aches.  5 days ago had some ABD pain but that is now gone.  Mild dry cough, no SOB. No CP. No HA or unilateral weakness.  She has a diabetic wound to her left great toe that is followed by the wound clinic it was debrided last week and the bandage has not been changed since that time.   No recorded temp. Tonight trouble walking and feels confused ? AMS. Symptoms moderate in severity.  Past Medical History  Diagnosis Date  . Diabetes mellitus   . Hypertension     History reviewed. No pertinent past surgical history.  History reviewed. No pertinent family history.  History  Substance Use Topics  . Smoking status: Never Smoker   . Smokeless tobacco: Not on file  . Alcohol Use: No    OB History   Grav Para Term Preterm Abortions TAB SAB Ect Mult Living                  Review of Systems  Constitutional: Negative for fever and chills.  HENT: Negative for neck pain and neck stiffness.   Eyes: Negative for pain.  Respiratory: Negative for shortness of breath.   Cardiovascular: Negative for chest pain.  Gastrointestinal: Positive for nausea and vomiting. Negative for abdominal pain.  Genitourinary: Negative for dysuria.  Musculoskeletal: Negative for back pain.  Skin: Positive for wound. Negative for rash.  Neurological: Negative for headaches.  All other systems reviewed and are negative.    Allergies  Aspirin; Ibuprofen; and Penicillins  Home Medications   Current Outpatient Rx  Name  Route  Sig  Dispense  Refill  . hydrochlorothiazide (HYDRODIURIL) 12.5 MG tablet   Oral    Take 1 tablet (12.5 mg total) by mouth daily.   30 tablet   2   . insulin NPH-insulin regular (NOVOLIN 70/30) (70-30) 100 UNIT/ML injection   Subcutaneous   Inject 35-45 Units into the skin 2 (two) times daily with a meal. Take 45 units Q AM and 35 units Q PM   10 mL   12   . lisinopril (ZESTRIL) 10 MG tablet   Oral   Take 1 tablet (10 mg total) by mouth daily.   30 tablet   2   . metFORMIN (GLUCOPHAGE) 1000 MG tablet   Oral   Take 1 tablet (1,000 mg total) by mouth 2 (two) times daily with a meal.   60 tablet   2     BP 98/58  Pulse 126  Temp(Src) 100.3 F (37.9 C) (Oral)  SpO2 98%  Physical Exam  Constitutional: She is oriented to person, place, and time. She appears well-developed and well-nourished.  HENT:  Head: Normocephalic and atraumatic.  Mouth/Throat: Oropharynx is clear and moist. No oropharyngeal exudate.  Eyes: EOM are normal. Pupils are equal, round, and reactive to light.  Neck: Normal range of motion. Neck supple.  Cardiovascular: Regular rhythm and intact distal pulses.   tachycardic  Pulmonary/Chest: Effort normal. No stridor. No respiratory distress.  Abdominal: Soft. Bowel sounds are normal. She exhibits no distension. There is no tenderness.  Musculoskeletal: Normal range  of motion. She exhibits no edema.  L great toe with diabetic ulcer and bandage with some purulent d/c, no active wound draigange  Neurological: She is alert and oriented to person, place, and time.  Skin: Skin is warm and dry.    ED Course  Procedures (including critical care time)  Results for orders placed during the hospital encounter of 02/17/13  SURGICAL PCR SCREEN      Result Value Range   MRSA, PCR NEGATIVE  NEGATIVE   Staphylococcus aureus NEGATIVE  NEGATIVE  CBC WITH DIFFERENTIAL      Result Value Range   WBC 8.6  4.0 - 10.5 K/uL   RBC 4.49  3.87 - 5.11 MIL/uL   Hemoglobin 13.9  12.0 - 15.0 g/dL   HCT 81.1  91.4 - 78.2 %   MCV 91.3  78.0 - 100.0 fL   MCH  31.0  26.0 - 34.0 pg   MCHC 33.9  30.0 - 36.0 g/dL   RDW 95.6  21.3 - 08.6 %   Platelets 206  150 - 400 K/uL   Neutrophils Relative 78 (*) 43 - 77 %   Neutro Abs 6.7  1.7 - 7.7 K/uL   Lymphocytes Relative 14  12 - 46 %   Lymphs Abs 1.2  0.7 - 4.0 K/uL   Monocytes Relative 8  3 - 12 %   Monocytes Absolute 0.7  0.1 - 1.0 K/uL   Eosinophils Relative 0  0 - 5 %   Eosinophils Absolute 0.0  0.0 - 0.7 K/uL   Basophils Relative 0  0 - 1 %   Basophils Absolute 0.0  0.0 - 0.1 K/uL  COMPREHENSIVE METABOLIC PANEL      Result Value Range   Sodium 131 (*) 135 - 145 mEq/L   Potassium 4.2  3.5 - 5.1 mEq/L   Chloride 93 (*) 96 - 112 mEq/L   CO2 21  19 - 32 mEq/L   Glucose, Bld 246 (*) 70 - 99 mg/dL   BUN 23  6 - 23 mg/dL   Creatinine, Ser 5.78 (*) 0.50 - 1.10 mg/dL   Calcium 9.3  8.4 - 46.9 mg/dL   Total Protein 7.5  6.0 - 8.3 g/dL   Albumin 3.4 (*) 3.5 - 5.2 g/dL   AST 18  0 - 37 U/L   ALT 21  0 - 35 U/L   Alkaline Phosphatase 77  39 - 117 U/L   Total Bilirubin 0.5  0.3 - 1.2 mg/dL   GFR calc non Af Amer 35 (*) >90 mL/min   GFR calc Af Amer 40 (*) >90 mL/min  GLUCOSE, CAPILLARY      Result Value Range   Glucose-Capillary 201 (*) 70 - 99 mg/dL  URINALYSIS, ROUTINE W REFLEX MICROSCOPIC      Result Value Range   Color, Urine YELLOW  YELLOW   APPearance CLOUDY (*) CLEAR   Specific Gravity, Urine 1.011  1.005 - 1.030   pH 5.5  5.0 - 8.0   Glucose, UA 500 (*) NEGATIVE mg/dL   Hgb urine dipstick NEGATIVE  NEGATIVE   Bilirubin Urine NEGATIVE  NEGATIVE   Ketones, ur 15 (*) NEGATIVE mg/dL   Protein, ur NEGATIVE  NEGATIVE mg/dL   Urobilinogen, UA 0.2  0.0 - 1.0 mg/dL   Nitrite NEGATIVE  NEGATIVE   Leukocytes, UA TRACE (*) NEGATIVE  PROCALCITONIN      Result Value Range   Procalcitonin 8.58    URINE MICROSCOPIC-ADD ON  Result Value Range   Squamous Epithelial / LPF FEW (*) RARE   WBC, UA 3-6  <3 WBC/hpf   Bacteria, UA RARE  RARE  GLUCOSE, CAPILLARY      Result Value Range    Glucose-Capillary 277 (*) 70 - 99 mg/dL  GLUCOSE, CAPILLARY      Result Value Range   Glucose-Capillary 214 (*) 70 - 99 mg/dL  GLUCOSE, CAPILLARY      Result Value Range   Glucose-Capillary 266 (*) 70 - 99 mg/dL   Comment 1 Notify RN     Comment 2 Documented in Chart    GLUCOSE, CAPILLARY      Result Value Range   Glucose-Capillary 196 (*) 70 - 99 mg/dL   Comment 1 Notify RN     Comment 2 Documented in Chart    CG4 I-STAT (LACTIC ACID)      Result Value Range   Lactic Acid, Venous 1.90  0.5 - 2.2 mmol/L  POCT I-STAT TROPONIN I      Result Value Range   Troponin i, poc 0.00  0.00 - 0.08 ng/mL   Comment 3            Mr Foot Left Wo Contrast  02/18/2013  *RADIOLOGY REPORT*  Clinical Data: Left great toe pain and swelling with open wound. Evaluate for osteomyelitis.  MRI OF THE LEFT FOREFOOT WITHOUT CONTRAST  Technique:  Multiplanar, multisequence MR imaging was performed. No intravenous contrast was administered.  Comparison: Radiographs 03/16/2011 through 02/18/2013.  MRI 09/30/2010.  Findings: There is chronic soft tissue edema within the great toe, especially distally within the nail bed.  No focal fluid collection is identified.  There is no interphalangeal or metatarsal phalangeal joint effusion. Chronic subcutaneous scarring plantar to the second proximal phalanx appears unchanged.  There is stable diffuse forefoot muscular atrophy.  The distal phalanx of the great toe demonstrates chronic tuftal cortical erosion compared with the prior MRI and with older radiographs. There is no associated marrow edema.  The proximal phalanx appears normal.  The additional toes and metatarsals appear normal.  The fibular sesamoid of the first metatarsal is bipartite.  IMPRESSION:  1.  Chronic tuftal cortical erosion of the distal phalanx of the great toe without associated marrow edema.  Given the history of diabetes and the adjacent inflammatory changes, findings are suspicious for chronic or previously  treated osteomyelitis. 2.  No evidence of acute osteomyelitis or drainable soft tissue abscess.   Original Report Authenticated By: Carey Bullocks, M.D.    Dg Chest Portable 1 View  02/17/2013  *RADIOLOGY REPORT*  Clinical Data: The T and weakness.  PORTABLE CHEST - 1 VIEW  Comparison: 08/12/2011.  Findings: The cardiac silhouette, mediastinal and hilar contours are within normal limits and stable. The lungs are clear.  No pleural effusions.  The bony thorax is intact.  IMPRESSION: Normal chest x-ray.   Original Report Authenticated By: Rudie Meyer, M.D.    Dg Foot 2 Views Left  02/18/2013  *RADIOLOGY REPORT*  Clinical Data: Diabetic foot ulcer on the left great toe.  LEFT FOOT - 2 VIEW  Comparison: None.  Findings: Iodoform dressing is present in the left great toe tip. There is no osteolysis of the great toe to suggest osteomyelitis. Comparing the cortex and trabecular markings of the great toe terminal phalanx to the prior radiographs of 2012, there is no interval change.  No gas is present in the soft tissues.  Calcaneal spurs incidentally noted.  Small vessel atherosclerosis.  Flexion of the toes is present on the frontal view of the foot.  IMPRESSION: Distal great toe ulceration with a radiopaque dressing.  No osteomyelitis.   Original Report Authenticated By: Andreas Newport, M.D.        Date: 02/17/2013  Rate: 115  Rhythm: sinus tachycardia  QRS Axis: normal  Intervals: normal  ST/T Wave abnormalities: nonspecific ST changes  Conduction Disutrbances:none  Narrative Interpretation:   Old EKG Reviewed: changes noted - previous ECG NSR rate 85  IVFs. IV ABx  MED consult and admit  MDM   N/V/D and hypotension, also has L great toe diabetic ulcer and fever  ECG, imaging, labs  Medications provided, some rebound hypotension - is fluid responsive  MEd admit       Sunnie Nielsen, MD 02/19/13 [phone]

## 2013-02-18 ENCOUNTER — Emergency Department (HOSPITAL_COMMUNITY): Payer: No Typology Code available for payment source

## 2013-02-18 ENCOUNTER — Other Ambulatory Visit (HOSPITAL_COMMUNITY): Payer: No Typology Code available for payment source

## 2013-02-18 ENCOUNTER — Inpatient Hospital Stay (HOSPITAL_COMMUNITY): Payer: No Typology Code available for payment source

## 2013-02-18 DIAGNOSIS — N179 Acute kidney failure, unspecified: Secondary | ICD-10-CM | POA: Diagnosis present

## 2013-02-18 DIAGNOSIS — K529 Noninfective gastroenteritis and colitis, unspecified: Secondary | ICD-10-CM | POA: Diagnosis not present

## 2013-02-18 DIAGNOSIS — E860 Dehydration: Secondary | ICD-10-CM | POA: Diagnosis present

## 2013-02-18 LAB — URINALYSIS, ROUTINE W REFLEX MICROSCOPIC
Bilirubin Urine: NEGATIVE
Glucose, UA: 500 mg/dL — AB
Hgb urine dipstick: NEGATIVE
Ketones, ur: 15 mg/dL — AB
Nitrite: NEGATIVE
Protein, ur: NEGATIVE mg/dL
Specific Gravity, Urine: 1.011 (ref 1.005–1.030)
Urobilinogen, UA: 0.2 mg/dL (ref 0.0–1.0)
pH: 5.5 (ref 5.0–8.0)

## 2013-02-18 LAB — GLUCOSE, CAPILLARY: Glucose-Capillary: 196 mg/dL — ABNORMAL HIGH (ref 70–99)

## 2013-02-18 LAB — SURGICAL PCR SCREEN: MRSA, PCR: NEGATIVE

## 2013-02-18 LAB — PROCALCITONIN: Procalcitonin: 8.58 ng/mL

## 2013-02-18 LAB — URINE MICROSCOPIC-ADD ON

## 2013-02-18 MED ORDER — ACETAMINOPHEN 325 MG PO TABS
650.0000 mg | ORAL_TABLET | Freq: Once | ORAL | Status: AC
  Administered 2013-02-18: 650 mg via ORAL
  Filled 2013-02-18: qty 2

## 2013-02-18 MED ORDER — ACETAMINOPHEN 650 MG RE SUPP: 650.0000 mg | Freq: Four times a day (QID) | RECTAL | Status: DC | PRN

## 2013-02-18 MED ORDER — ONDANSETRON HCL 4 MG/2ML IJ SOLN: 4.0000 mg | Freq: Four times a day (QID) | INTRAMUSCULAR | Status: DC | PRN

## 2013-02-18 MED ORDER — VANCOMYCIN HCL 10 G IV SOLR
1750.0000 mg | Freq: Once | INTRAVENOUS | Status: AC
  Administered 2013-02-18: 1750 mg via INTRAVENOUS
  Filled 2013-02-18: qty 1750

## 2013-02-18 MED ORDER — INSULIN ASPART 100 UNIT/ML ~~LOC~~ SOLN: 0.0000 [IU] | Freq: Every day | SUBCUTANEOUS | Status: DC

## 2013-02-18 MED ORDER — INSULIN ASPART 100 UNIT/ML ~~LOC~~ SOLN: 3.0000 [IU] | Freq: Three times a day (TID) | SUBCUTANEOUS | Status: DC

## 2013-02-18 MED ORDER — LEVOFLOXACIN IN D5W 750 MG/150ML IV SOLN
750.0000 mg | INTRAVENOUS | Status: DC
  Administered 2013-02-18: 750 mg via INTRAVENOUS
  Filled 2013-02-18 (×3): qty 150

## 2013-02-18 MED ORDER — ACETAMINOPHEN 325 MG PO TABS: 650.0000 mg | ORAL_TABLET | Freq: Four times a day (QID) | ORAL | Status: DC | PRN

## 2013-02-18 MED ORDER — HYDROCODONE-ACETAMINOPHEN 5-325 MG PO TABS
1.0000 | ORAL_TABLET | ORAL | Status: DC | PRN
  Administered 2013-02-18: 2 via ORAL
  Administered 2013-02-19: 1 via ORAL
  Administered 2013-02-19 – 2013-02-20 (×3): 2 via ORAL
  Filled 2013-02-18 (×5): qty 2

## 2013-02-18 MED ORDER — SODIUM CHLORIDE 0.9 % IV BOLUS (SEPSIS)
500.0000 mL | Freq: Once | INTRAVENOUS | Status: AC
Start: 2013-02-18 — End: 2013-02-18
  Administered 2013-02-18: 500 mL via INTRAVENOUS

## 2013-02-18 MED ORDER — ENOXAPARIN SODIUM 60 MG/0.6ML ~~LOC~~ SOLN
60.0000 mg | SUBCUTANEOUS | Status: DC
  Administered 2013-02-20: 60 mg via SUBCUTANEOUS
  Filled 2013-02-18 (×2): qty 0.6

## 2013-02-18 MED ORDER — VANCOMYCIN HCL 10 G IV SOLR
1250.0000 mg | Freq: Two times a day (BID) | INTRAVENOUS | Status: DC
  Administered 2013-02-18 – 2013-02-20 (×4): 1250 mg via INTRAVENOUS
  Filled 2013-02-18 (×6): qty 1250

## 2013-02-18 MED ORDER — ONDANSETRON HCL 4 MG PO TABS: 4.0000 mg | ORAL_TABLET | Freq: Four times a day (QID) | ORAL | Status: DC | PRN

## 2013-02-18 MED ORDER — SODIUM CHLORIDE 0.9 % IV BOLUS (SEPSIS)
1000.0000 mL | Freq: Once | INTRAVENOUS | Status: AC
  Administered 2013-02-18: 1000 mL via INTRAVENOUS

## 2013-02-18 MED ORDER — INSULIN ASPART 100 UNIT/ML ~~LOC~~ SOLN
0.0000 [IU] | Freq: Three times a day (TID) | SUBCUTANEOUS | Status: DC
  Administered 2013-02-18: 5 [IU] via SUBCUTANEOUS
  Administered 2013-02-18 (×2): 8 [IU] via SUBCUTANEOUS
  Administered 2013-02-19: 08:00:00 via SUBCUTANEOUS
  Administered 2013-02-19: 2 [IU] via SUBCUTANEOUS
  Administered 2013-02-20 (×2): 5 [IU] via SUBCUTANEOUS
  Administered 2013-02-20: 3 [IU] via SUBCUTANEOUS

## 2013-02-18 MED ORDER — INSULIN ASPART PROT & ASPART (70-30 MIX) 100 UNIT/ML ~~LOC~~ SUSP
35.0000 [IU] | Freq: Every day | SUBCUTANEOUS | Status: DC
  Administered 2013-02-18 – 2013-02-20 (×3): 35 [IU] via SUBCUTANEOUS

## 2013-02-18 MED ORDER — SODIUM CHLORIDE 0.9 % IV SOLN
INTRAVENOUS | Status: DC
  Administered 2013-02-18: 07:00:00 via INTRAVENOUS
  Administered 2013-02-18 – 2013-02-19 (×2): 125 mL/h via INTRAVENOUS
  Administered 2013-02-19: 22:00:00 via INTRAVENOUS

## 2013-02-18 MED ORDER — ENOXAPARIN SODIUM 40 MG/0.4ML ~~LOC~~ SOLN
40.0000 mg | SUBCUTANEOUS | Status: DC
  Filled 2013-02-18: qty 0.4

## 2013-02-18 MED ORDER — CLONIDINE HCL 0.1 MG PO TABS
0.1000 mg | ORAL_TABLET | Freq: Four times a day (QID) | ORAL | Status: DC | PRN
  Filled 2013-02-18: qty 1

## 2013-02-18 MED ORDER — INSULIN NPH ISOPHANE & REGULAR (70-30) 100 UNIT/ML ~~LOC~~ SUSP: 35.0000 [IU] | Freq: Two times a day (BID) | SUBCUTANEOUS | Status: DC

## 2013-02-18 MED ORDER — INSULIN ASPART PROT & ASPART (70-30 MIX) 100 UNIT/ML ~~LOC~~ SUSP
45.0000 [IU] | Freq: Every day | SUBCUTANEOUS | Status: DC
  Administered 2013-02-18 – 2013-02-20 (×3): 45 [IU] via SUBCUTANEOUS
  Filled 2013-02-18: qty 10

## 2013-02-18 MED ORDER — ZOLPIDEM TARTRATE 5 MG PO TABS
5.0000 mg | ORAL_TABLET | Freq: Every evening | ORAL | Status: DC | PRN
  Administered 2013-02-18 – 2013-02-19 (×2): 5 mg via ORAL
  Filled 2013-02-18 (×2): qty 1

## 2013-02-18 NOTE — Consult Note (Signed)
Reason for Consult:Left great toe ulcer Referring Physician:Akula  Shadae Maisie [name] is an 52 y.o. female.  HPI: Left great toe chronic ulceration for which she goes to [facility] center . Reports wound was debrided this past Friday . Patient worsening vomiting, malaise and foot pain since ;last Tuesday. MAX Temp 103.   Past Medical History  Diagnosis Date  . Diabetes mellitus   . Hypertension     History reviewed. No pertinent past surgical history.  History reviewed. No pertinent family history.  Social History:  reports that she has never smoked. She does not have any smokeless tobacco history on file. She reports that she does not drink alcohol or use illicit drugs.  Allergies:  Allergies  Allergen Reactions  . Aspirin Nausea And Vomiting  . Ibuprofen Nausea And Vomiting and Other (See Comments)    abd pain  . Penicillins Other (See Comments)    unknown    Medications:Reviewed  Results for orders placed during the hospital encounter of 02/17/13 (from the past 48 hour(s))  GLUCOSE, CAPILLARY     Status: Abnormal   Collection Time    02/17/13 10:28 PM      Result Value Range   Glucose-Capillary 201 (*) 70 - 99 mg/dL  CBC WITH DIFFERENTIAL     Status: Abnormal   Collection Time    02/17/13 11:01 PM      Result Value Range   WBC 8.6  4.0 - 10.5 K/uL   RBC 4.49  3.87 - 5.11 MIL/uL   Hemoglobin 13.9  12.0 - 15.0 g/dL   HCT 16.1  09.6 - 04.5 %   MCV 91.3  78.0 - 100.0 fL   MCH 31.0  26.0 - 34.0 pg   MCHC 33.9  30.0 - 36.0 g/dL   RDW 40.9  81.1 - 91.4 %   Platelets 206  150 - 400 K/uL   Neutrophils Relative 78 (*) 43 - 77 %   Neutro Abs 6.7  1.7 - 7.7 K/uL   Lymphocytes Relative 14  12 - 46 %   Lymphs Abs 1.2  0.7 - 4.0 K/uL   Monocytes Relative 8  3 - 12 %   Monocytes Absolute 0.7  0.1 - 1.0 K/uL   Eosinophils Relative 0  0 - 5 %   Eosinophils Absolute 0.0  0.0 - 0.7 K/uL   Basophils Relative 0  0 - 1 %   Basophils Absolute 0.0  0.0 - 0.1 K/uL  COMPREHENSIVE  METABOLIC PANEL     Status: Abnormal   Collection Time    02/17/13 11:01 PM      Result Value Range   Sodium 131 (*) 135 - 145 mEq/L   Potassium 4.2  3.5 - 5.1 mEq/L   Chloride 93 (*) 96 - 112 mEq/L   CO2 21  19 - 32 mEq/L   Glucose, Bld 246 (*) 70 - 99 mg/dL   BUN 23  6 - 23 mg/dL   Creatinine, Ser 7.82 (*) 0.50 - 1.10 mg/dL   Calcium 9.3  8.4 - 95.6 mg/dL   Total Protein 7.5  6.0 - 8.3 g/dL   Albumin 3.4 (*) 3.5 - 5.2 g/dL   AST 18  0 - 37 U/L   ALT 21  0 - 35 U/L   Alkaline Phosphatase 77  39 - 117 U/L   Total Bilirubin 0.5  0.3 - 1.2 mg/dL   GFR calc non Af Amer 35 (*) >90 mL/min   GFR calc Af Amer 40 (*) >  90 mL/min   Comment:            The eGFR has been calculated     using the CKD EPI equation.     This calculation has not been     validated in all clinical     situations.     eGFR's persistently     <90 mL/min signify     possible Chronic Kidney Disease.  PROCALCITONIN     Status: None   Collection Time    02/17/13 11:01 PM      Result Value Range   Procalcitonin 8.58     Comment:            Interpretation:     PCT > 2 ng/mL:     Systemic infection (sepsis) is likely,     unless other causes are known.     (NOTE)             ICU PCT Algorithm               Non ICU PCT Algorithm        ----------------------------     ------------------------------             PCT < 0.25 ng/mL                 PCT < 0.1 ng/mL         Stopping of antibiotics            Stopping of antibiotics           strongly encouraged.               strongly encouraged.        ----------------------------     ------------------------------           PCT level decrease by               PCT < 0.25 ng/mL           >= 80% from peak PCT           OR PCT 0.25 - 0.5 ng/mL          Stopping of antibiotics                                                 encouraged.         Stopping of antibiotics               encouraged.        ----------------------------     ------------------------------            PCT level decrease by              PCT >= 0.25 ng/mL           < 80% from peak PCT            AND PCT >= 0.5 ng/mL            Continuing antibiotics                                                  encouraged.           Continuing antibiotics  encouraged.        ----------------------------     ------------------------------         PCT level increase compared          PCT > 0.5 ng/mL             with peak PCT AND              PCT >= 0.5 ng/mL             Escalation of antibiotics                                              strongly encouraged.          Escalation of antibiotics            strongly encouraged.  POCT I-STAT TROPONIN I     Status: None   Collection Time    02/17/13 11:10 PM      Result Value Range   Troponin i, poc 0.00  0.00 - 0.08 ng/mL   Comment 3            Comment: Due to the release kinetics of cTnI,     a negative result within the first hours     of the onset of symptoms does not rule out     myocardial infarction with certainty.     If myocardial infarction is still suspected,     repeat the test at appropriate intervals.  CG4 I-STAT (LACTIC ACID)     Status: None   Collection Time    02/17/13 11:12 PM      Result Value Range   Lactic Acid, Venous 1.90  0.5 - 2.2 mmol/L  URINALYSIS, ROUTINE W REFLEX MICROSCOPIC     Status: Abnormal   Collection Time    02/18/13  6:00 AM      Result Value Range   Color, Urine YELLOW  YELLOW   APPearance CLOUDY (*) CLEAR   Specific Gravity, Urine 1.011  1.005 - 1.030   pH 5.5  5.0 - 8.0   Glucose, UA 500 (*) NEGATIVE mg/dL   Hgb urine dipstick NEGATIVE  NEGATIVE   Bilirubin Urine NEGATIVE  NEGATIVE   Ketones, ur 15 (*) NEGATIVE mg/dL   Protein, ur NEGATIVE  NEGATIVE mg/dL   Urobilinogen, UA 0.2  0.0 - 1.0 mg/dL   Nitrite NEGATIVE  NEGATIVE   Leukocytes, UA TRACE (*) NEGATIVE  URINE MICROSCOPIC-ADD ON     Status: Abnormal   Collection Time    02/18/13  6:00 AM      Result Value Range   Squamous  Epithelial / LPF FEW (*) RARE   WBC, UA 3-6  <3 WBC/hpf   Bacteria, UA RARE  RARE  GLUCOSE, CAPILLARY     Status: Abnormal   Collection Time    02/18/13  7:43 AM      Result Value Range   Glucose-Capillary 277 (*) 70 - 99 mg/dL  GLUCOSE, CAPILLARY     Status: Abnormal   Collection Time    02/18/13 11:22 AM      Result Value Range   Glucose-Capillary 214 (*) 70 - 99 mg/dL  GLUCOSE, CAPILLARY     Status: Abnormal   Collection Time    02/18/13  4:30 PM      Result Value Range   Glucose-Capillary 266 (*) 70 -  99 mg/dL   Comment 1 Notify RN     Comment 2 Documented in Chart      Mr Foot Left Wo Contrast  02/18/2013  *RADIOLOGY REPORT*  Clinical Data: Left great toe pain and swelling with open wound. Evaluate for osteomyelitis.  MRI OF THE LEFT FOREFOOT WITHOUT CONTRAST  Technique:  Multiplanar, multisequence MR imaging was performed. No intravenous contrast was administered.  Comparison: Radiographs 03/16/2011 through 02/18/2013.  MRI 09/30/2010.  Findings: There is chronic soft tissue edema within the great toe, especially distally within the nail bed.  No focal fluid collection is identified.  There is no interphalangeal or metatarsal phalangeal joint effusion. Chronic subcutaneous scarring plantar to the second proximal phalanx appears unchanged.  There is stable diffuse forefoot muscular atrophy.  The distal phalanx of the great toe demonstrates chronic tuftal cortical erosion compared with the prior MRI and with older radiographs. There is no associated marrow edema.  The proximal phalanx appears normal.  The additional toes and metatarsals appear normal.  The fibular sesamoid of the first metatarsal is bipartite.  IMPRESSION:  1.  Chronic tuftal cortical erosion of the distal phalanx of the great toe without associated marrow edema.  Given the history of diabetes and the adjacent inflammatory changes, findings are suspicious for chronic or previously treated osteomyelitis. 2.  No evidence of  acute osteomyelitis or drainable soft tissue abscess.   Original Report Authenticated By: Carey Bullocks, M.D.    Dg Chest Portable 1 View  02/17/2013  *RADIOLOGY REPORT*  Clinical Data: The T and weakness.  PORTABLE CHEST - 1 VIEW  Comparison: 08/12/2011.  Findings: The cardiac silhouette, mediastinal and hilar contours are within normal limits and stable. The lungs are clear.  No pleural effusions.  The bony thorax is intact.  IMPRESSION: Normal chest x-ray.   Original Report Authenticated By: Rudie Meyer, M.D.    Dg Foot 2 Views Left  02/18/2013  *RADIOLOGY REPORT*  Clinical Data: Diabetic foot ulcer on the left great toe.  LEFT FOOT - 2 VIEW  Comparison: None.  Findings: Iodoform dressing is present in the left great toe tip. There is no osteolysis of the great toe to suggest osteomyelitis. Comparing the cortex and trabecular markings of the great toe terminal phalanx to the prior radiographs of 2012, there is no interval change.  No gas is present in the soft tissues.  Calcaneal spurs incidentally noted.  Small vessel atherosclerosis.  Flexion of the toes is present on the frontal view of the foot.  IMPRESSION: Distal great toe ulceration with a radiopaque dressing.  No osteomyelitis.   Original Report Authenticated By: Andreas Newport, M.D.     Review of Systems  Constitutional: Positive for fever, chills and malaise/fatigue. Negative for weight loss.  Respiratory: Negative.   Cardiovascular: Negative.   Gastrointestinal: Positive for nausea and vomiting.  Musculoskeletal:       Left foot pain  Skin: Negative for itching and rash.   Blood pressure 88/51, pulse 91, temperature 99 F (37.2 C), temperature source Oral, resp. rate 20, height 6\' 1"  (1.854 m), weight 121 kg (266 lb 12.1 oz), SpO2 100.00%. Physical Exam  Constitutional: She is oriented to person, place, and time. She appears well-developed.  HENT:  Head: Normocephalic and atraumatic.  Eyes: EOM are normal.  Cardiovascular:  Normal rate and intact distal pulses.   Respiratory: Effort normal.  Neurological: She is alert and oriented to person, place, and time.  Skin:  purulent drainage left great toe with ulceration at  tip of toe no red streaking up the leg  Psychiatric: She has a normal mood and affect. Her behavior is normal.    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 DM Plan Left great toe amputation tomorrow On vancomycin and Levaquin   Pelham Hennick 02/18/2013, 6:00 PM

## 2013-02-18 NOTE — ED Notes (Signed)
Steward Drone (Daughter)    Cell: [phone]  Work: [phone]

## 2013-02-18 NOTE — Progress Notes (Signed)
[name], female   DOB: 1961/10/01, 52 y.o.   MRN: [id number] I did see Ms. Thomas with Rexene Edison PA-C and evaluated/examined her fully.  I agree with his note. We will plan on surgery tomorrow 01/22/13 for a left great toe amputation.  She understands this fully.

## 2013-02-18 NOTE — H&P (Addendum)
PCP:   health service   Chief Complaint:  Not feeling right  HPI: This is a 52 year old female who states that since Tuesday she's had intermittent nausea, vomiting and diarrhea, no evidence of bleeding. On Friday she vomited most of the day.She's had subjective fevers and chills. Today she just simply did not feel right, she felt weak, the symptoms continued to get worse during the course of the day, she came to the ER. She reports pain in the left foot above the ankle. She is a chronic ulcer in the left great toe for which he goes to wound care clinic on [employer]. Her toe was significantly debrided on Friday, she has not seen it since it has remained bandaged. She is on no antibiotics. On presentation to the ER the patient was hypotensive with a blood pressure 78/50, she was also febrile with a MAXIMUM TEMPERATURE of 103.2. She received 1 L bolus fluid resuscitation and her blood pressures rebounded. The hospitalist was called to admit for diabetic foot ulcer  With a question of osteomyelitis. History provided by the patient was alert and oriented.  Review of Systems:  The patient denies anorexia, weight loss,, vision loss, decreased hearing, hoarseness, chest pain, syncope, dyspnea on exertion, peripheral edema, balance deficits, hemoptysis, abdominal pain, melena, hematochezia, severe indigestion/heartburn, hematuria, incontinence, genital sores, muscle weakness, suspicious skin lesions, transient blindness, difficulty walking, depression, unusual weight change, abnormal bleeding, enlarged lymph nodes, angioedema, and breast masses.  Past Medical History: Past Medical History  Diagnosis Date  . Diabetes mellitus   . Hypertension    History reviewed. hypertension  Medications: Prior to Admission medications   Medication Sig Start Date End Date Taking? Authorizing Provider  hydrochlorothiazide (HYDRODIURIL) 12.5 MG tablet Take 12.5 mg by mouth daily. 01/29/13  Yes Richarda Overlie, MD  insulin  NPH-insulin regular (NOVOLIN 70/30) (70-30) 100 UNIT/ML injection Inject 35-45 Units into the skin 2 (two) times daily with a meal. Take 45 units Q AM and 35 units Q PM 02/05/13  Yes Christina P Rama, MD  lisinopril (PRINIVIL,ZESTRIL) 10 MG tablet Take 10 mg by mouth daily. 01/29/13  Yes Richarda Overlie, MD  metFORMIN (GLUCOPHAGE) 1000 MG tablet Take 1,000 mg by mouth 2 (two) times daily with a meal. 01/14/13  Yes Clanford Cyndie Mull, MD  traMADol (ULTRAM) 50 MG tablet Take 50 mg by mouth every 6 (six) hours as needed for pain.   Yes Historical Provider, MD    Allergies:   Allergies  Allergen Reactions  . Aspirin Nausea And Vomiting  . Ibuprofen Nausea And Vomiting and Other (See Comments)    abd pain  . Penicillins Other (See Comments)    unknown    Social History:  reports that she has never smoked. She does not have any smokeless tobacco history on file. She reports that she does not drink alcohol or use illicit drugs.  Family History: History reviewed. No pertinent family history.  Physical Exam: Filed Vitals:   02/17/13 2330 02/18/13 0030 02/18/13 0102 02/18/13 0130  BP: 111/59 122/76  121/66  Pulse: 113 119  107  Temp:   103.2 F (39.6 C)   TempSrc:   Rectal   Resp: 21 21  20   SpO2: 99% 99%  99%    General:  Alert and oriented times three, well developed and nourished, no acute distress Eyes: PERRLA, pink conjunctiva, no scleral icterus ENT: Moist oral mucosa, neck supple, no thyromegaly Lungs: clear to ascultation, no wheeze, no crackles, no use of accessory  muscles Cardiovascular: regular rate and rhythm, no regurgitation, no gallops, no murmurs. No carotid bruits, no JVD Abdomen: soft, positive BS, non-tender, non-distended, no organomegaly, not an acute abdomen GU: not examined Neuro: CN II - XII grossly intact, sensation intact Musculoskeletal: strength 5/5 all extremities, no clubbing, cyanosis or edema, right toe diabetic foot ulcer Skin: no rash, no subcutaneous  crepitation, no decubitus Psych: appropriate patient   Labs on Admission:   Recent Labs  02/17/13 2301  NA 131*  K 4.2  CL 93*  CO2 21  GLUCOSE 246*  BUN 23  CREATININE 1.67*  CALCIUM 9.3    Recent Labs  02/17/13 2301  AST 18  ALT 21  ALKPHOS 77  BILITOT 0.5  PROT 7.5  ALBUMIN 3.4*   No results found for this basename: LIPASE, AMYLASE,  in the last 72 hours  Recent Labs  02/17/13 2301  WBC 8.6  NEUTROABS 6.7  HGB 13.9  HCT 41.0  MCV 91.3  PLT 206    Micro Results: No results found for this or any previous visit (from the past 240 hour(s)).   Radiological Exams on Admission: Dg Chest Portable 1 View  02/17/2013  *RADIOLOGY REPORT*  Clinical Data: The T and weakness.  PORTABLE CHEST - 1 VIEW  Comparison: 08/12/2011.  Findings: The cardiac silhouette, mediastinal and hilar contours are within normal limits and stable. The lungs are clear.  No pleural effusions.  The bony thorax is intact.  IMPRESSION: Normal chest x-ray.   Original Report Authenticated By: Rudie Meyer, M.D.    Dg Foot 2 Views Left  02/18/2013  *RADIOLOGY REPORT*  Clinical Data: Diabetic foot ulcer on the left great toe.  LEFT FOOT - 2 VIEW  Comparison: None.  Findings: Iodoform dressing is present in the left great toe tip. There is no osteolysis of the great toe to suggest osteomyelitis. Comparing the cortex and trabecular markings of the great toe terminal phalanx to the prior radiographs of 2012, there is no interval change.  No gas is present in the soft tissues.  Calcaneal spurs incidentally noted.  Small vessel atherosclerosis.  Flexion of the toes is present on the frontal view of the foot.  IMPRESSION: Distal great toe ulceration with a radiopaque dressing.  No osteomyelitis.   Original Report Authenticated By: Andreas Newport, M.D.     Assessment/Plan Present on Admission:  Gastroenteritis Acute kidney injury/dehydration Admit to MedSurg Antiemetics ordered as needed, C. Difficile  ordered Blood cultures x2 Continue IV fluid resuscitation Repeat BMP in a.m. Diabetic foot ulcer Levaquin and vancomycin ordered  Consults wound care MRI right foot to rule out osteomyelitis to see if source of infection . DIABETES MELLITUS, TYPE II . HYPERTENSION, BENIGN ESSENTIAL . DIABETIC PERIPHERAL NEUROPATHY Stable resume home medications except HCTZ, lisinopril, metformin ADA diet and sliding scale insulin Hypertension As needed Blood pressure medications ordered    Full code DVT prophylaxis   Time in: 1:25 AM Time out: Shahil Speegle 02/18/2013, 1:57 AM

## 2013-02-18 NOTE — Progress Notes (Signed)
[facility] PROGRESS NOTE  [name] VWU:[id number] DOB: [birth year] DOA: 02/17/2013 PCP: Pcp Not In System  Assessment/Plan:  1. Diabetic foot ulcer On iv antibiotics - mri of the foot showed chronic osteomyelitis - ortho consultation called.   2. Dehydration: fluids ordered.   3. Acute renal failure: probably pre renal. Repeat labs in am.   4. DM:  CBG (last 3)   Recent Labs  02/18/13 0743 02/18/13 1122 02/18/13 1630  GLUCAP 277* 214* 266*    Will add on premeal coverage.  4. Dvt prophylaxis   Consultants:  Orthopedics.  HPI/Subjective: Comfortable. Sore at the toe  Objective: Filed Vitals:   02/18/13 0500 02/18/13 0515 02/18/13 0603 02/18/13 1311  BP: 105/63 105/56 107/68 88/51  Pulse: 101 89 89 91  Temp:   99.3 F (37.4 C) 99 F (37.2 C)  TempSrc:   Oral Oral  Resp: 21 20 20 20   Height:   6\' 1"  (1.854 m)   Weight:   121 kg (266 lb 12.1 oz)   SpO2: 99% 97% 97% 100%    Intake/Output Summary (Last 24 hours) at 02/18/13 1901 Last data filed at 02/18/13 1553  Gross per 24 hour  Intake    600 ml  Output   3400 ml  Net  -2800 ml   Filed Weights   02/18/13 0603  Weight: 121 kg (266 lb 12.1 oz)    Exam: Lungs: clear to ascultation, no wheeze, no crackles, no use of accessory muscles  Cardiovascular: regular rate and rhythm, no regurgitation, no gallops, no murmurs. No carotid bruits, no JVD  Abdomen: soft, positive BS, non-tender, non-distended, no organomegaly, not an acute abdomen  GU: not examined  Neuro: CN II - XII grossly intact, sensation intact  Musculoskeletal: strength 5/5 all extremities, no clubbing, cyanosis or edema, right toe diabetic foot ulcer  Skin: no rash, no subcutaneous crepitation, no decubitus  Psych: appropriate patient     Data Reviewed: Basic Metabolic Panel:  Recent Labs Lab 02/17/13 2301  NA 131*  K 4.2  CL 93*  CO2 21  GLUCOSE 246*  BUN 23  CREATININE 1.67*  CALCIUM 9.3   Liver Function  Tests:  Recent Labs Lab 02/17/13 2301  AST 18  ALT 21  ALKPHOS 77  BILITOT 0.5  PROT 7.5  ALBUMIN 3.4*   No results found for this basename: LIPASE, AMYLASE,  in the last 168 hours No results found for this basename: AMMONIA,  in the last 168 hours CBC:  Recent Labs Lab 02/17/13 2301  WBC 8.6  NEUTROABS 6.7  HGB 13.9  HCT 41.0  MCV 91.3  PLT 206   Cardiac Enzymes: No results found for this basename: CKTOTAL, CKMB, CKMBINDEX, TROPONINI,  in the last 168 hours BNP (last 3 results) No results found for this basename: PROBNP,  in the last 8760 hours CBG:  Recent Labs Lab 02/17/13 2228 02/18/13 0743 02/18/13 1122 02/18/13 1630  GLUCAP 201* 277* 214* 266*    No results found for this or any previous visit (from the past 240 hour(s)).   Studies: Mr Foot Left Wo Contrast  02/18/2013  *RADIOLOGY REPORT*  Clinical Data: Left great toe pain and swelling with open wound. Evaluate for osteomyelitis.  MRI OF THE LEFT FOREFOOT WITHOUT CONTRAST  Technique:  Multiplanar, multisequence MR imaging was performed. No intravenous contrast was administered.  Comparison: Radiographs 03/16/2011 through 02/18/2013.  MRI 09/30/2010.  Findings: There is chronic soft tissue edema within the great toe, especially distally within the nail  bed.  No focal fluid collection is identified.  There is no interphalangeal or metatarsal phalangeal joint effusion. Chronic subcutaneous scarring plantar to the second proximal phalanx appears unchanged.  There is stable diffuse forefoot muscular atrophy.  The distal phalanx of the great toe demonstrates chronic tuftal cortical erosion compared with the prior MRI and with older radiographs. There is no associated marrow edema.  The proximal phalanx appears normal.  The additional toes and metatarsals appear normal.  The fibular sesamoid of the first metatarsal is bipartite.  IMPRESSION:  1.  Chronic tuftal cortical erosion of the distal phalanx of the great toe  without associated marrow edema.  Given the history of diabetes and the adjacent inflammatory changes, findings are suspicious for chronic or previously treated osteomyelitis. 2.  No evidence of acute osteomyelitis or drainable soft tissue abscess.   Original Report Authenticated By: Carey Bullocks, M.D.    Dg Chest Portable 1 View  02/17/2013  *RADIOLOGY REPORT*  Clinical Data: The T and weakness.  PORTABLE CHEST - 1 VIEW  Comparison: 08/12/2011.  Findings: The cardiac silhouette, mediastinal and hilar contours are within normal limits and stable. The lungs are clear.  No pleural effusions.  The bony thorax is intact.  IMPRESSION: Normal chest x-ray.   Original Report Authenticated By: Rudie Meyer, M.D.    Dg Foot 2 Views Left  02/18/2013  *RADIOLOGY REPORT*  Clinical Data: Diabetic foot ulcer on the left great toe.  LEFT FOOT - 2 VIEW  Comparison: None.  Findings: Iodoform dressing is present in the left great toe tip. There is no osteolysis of the great toe to suggest osteomyelitis. Comparing the cortex and trabecular markings of the great toe terminal phalanx to the prior radiographs of 2012, there is no interval change.  No gas is present in the soft tissues.  Calcaneal spurs incidentally noted.  Small vessel atherosclerosis.  Flexion of the toes is present on the frontal view of the foot.  IMPRESSION: Distal great toe ulceration with a radiopaque dressing.  No osteomyelitis.   Original Report Authenticated By: Andreas Newport, M.D.     Scheduled Meds: . [START ON 02/19/2013] enoxaparin (LOVENOX) injection  60 mg Subcutaneous Q24H  . insulin aspart  0-15 Units Subcutaneous TID WC  . insulin aspart  0-5 Units Subcutaneous QHS  . insulin aspart protamine-insulin aspart  35 Units Subcutaneous Q supper  . insulin aspart protamine-insulin aspart  45 Units Subcutaneous Q breakfast  . levofloxacin (LEVAQUIN) IV  750 mg Intravenous Q24H  . sodium chloride  500 mL Intravenous Once  . vancomycin  1,250 mg  Intravenous Q12H   Continuous Infusions: . sodium chloride 125 mL/hr at 02/18/13 0630    Active Problems:   DIABETES MELLITUS, TYPE II   DIABETIC PERIPHERAL NEUROPATHY   HYPERTENSION, BENIGN ESSENTIAL   DIABETIC FOOT ULCER, TOE   Gastroenteritis   Acute kidney injury   Dehydration        [facility]  [facility] Pager [phone]. If 7PM-7AM, please contact night-coverage at [email or web address], password [email or web address] 02/18/2013, 7:01 PM  LOS: 1 day

## 2013-02-18 NOTE — ED Notes (Signed)
RN notified EDP and Admitting physician of patient's BP. BP confirmed manually by RN and NT.

## 2013-02-18 NOTE — Progress Notes (Signed)
ANTIBIOTIC CONSULT NOTE - INITIAL  Pharmacy Consult for vancomycin, levofloxacin Indication: R/o SSTI  Allergies  Allergen Reactions  . Aspirin Nausea And Vomiting  . Ibuprofen Nausea And Vomiting and Other (See Comments)    abd pain  . Penicillins Other (See Comments)    unknown    Patient Measurements: Height: 6\' 1"  (185.4 cm) Weight: 266 lb 12.1 oz (121 kg) IBW/kg (Calculated) : 75.4  Vital Signs: Temp: 99.3 F (37.4 C) (03/24 0603) Temp src: Oral (03/24 0603) BP: 107/68 mmHg (03/24 0603) Pulse Rate: 89 (03/24 0603) Intake/Output from previous day: 03/23 0701 - 03/24 0700 In: -  Out: 800 [Urine:800] Intake/Output from this shift: Total I/O In: -  Out: 800 [Urine:800]  Labs:  Recent Labs  02/17/13 2301  WBC 8.6  HGB 13.9  PLT 206  CREATININE 1.67*   Estimated Creatinine Clearance: 59.6 ml/min (by C-G formula based on Cr of 1.67). No results found for this basename: VANCOTROUGH, VANCOPEAK, VANCORANDOM, GENTTROUGH, GENTPEAK, GENTRANDOM, TOBRATROUGH, TOBRAPEAK, TOBRARND, AMIKACINPEAK, AMIKACINTROU, AMIKACIN,  in the last 72 hours   Microbiology: No results found for this or any previous visit (from the past 720 hour(s)).  Medical History: Past Medical History  Diagnosis Date  . Diabetes mellitus   . Hypertension     Medications:  Scheduled:  . [COMPLETED] acetaminophen  650 mg Oral Once  . enoxaparin (LOVENOX) injection  40 mg Subcutaneous Q24H  . insulin aspart  0-15 Units Subcutaneous TID WC  . insulin aspart  0-5 Units Subcutaneous QHS  . insulin aspart protamine-insulin aspart  35 Units Subcutaneous Q supper  . insulin aspart protamine-insulin aspart  45 Units Subcutaneous Q breakfast  . [COMPLETED] levofloxacin (LEVAQUIN) IV  500 mg Intravenous Once  . [COMPLETED] sodium chloride  1,000 mL Intravenous Once  . [COMPLETED] sodium chloride  1,000 mL Intravenous Once  . [COMPLETED] sodium chloride  1,000 mL Intravenous Once  . [COMPLETED]  vancomycin  1,000 mg Intravenous Once  . [DISCONTINUED] insulin NPH-insulin regular  35-45 Units Subcutaneous BID WC   Assessment: 52 yo female admitted with diabetic foot ulcer and possible osteomyelitis. Pharmacy to manage vancomycin and levofloxacin. Patient has already received vancomycin 1gm at ~ 00:30 and levofloxacin 500mg  at ~01:30.   Goal of Therapy:  Vancomycin trough 10-20 mcg/mL  Plan:  1. Levofloxacin 750mg  IV Q24H.  2. Vancomycin 1.75gm IV x 1, then 1.25gm IV Q12H.   Emeline Gins 02/18/2013,6:23 AM

## 2013-02-19 ENCOUNTER — Encounter (HOSPITAL_COMMUNITY): Payer: Self-pay | Admitting: Anesthesiology

## 2013-02-19 ENCOUNTER — Encounter (HOSPITAL_COMMUNITY)
Admission: EM | Disposition: A | Discharge status: Home / Self Care | Payer: Self-pay | Source: Home / Self Care | Attending: Internal Medicine

## 2013-02-19 ENCOUNTER — Inpatient Hospital Stay (HOSPITAL_COMMUNITY): Payer: No Typology Code available for payment source | Admitting: Anesthesiology

## 2013-02-19 DIAGNOSIS — N179 Acute kidney failure, unspecified: Secondary | ICD-10-CM

## 2013-02-19 DIAGNOSIS — R509 Fever, unspecified: Secondary | ICD-10-CM

## 2013-02-19 DIAGNOSIS — E1169 Type 2 diabetes mellitus with other specified complication: Principal | ICD-10-CM

## 2013-02-19 DIAGNOSIS — L97509 Non-pressure chronic ulcer of other part of unspecified foot with unspecified severity: Secondary | ICD-10-CM

## 2013-02-19 DIAGNOSIS — Z8739 Personal history of other diseases of the musculoskeletal system and connective tissue: Secondary | ICD-10-CM

## 2013-02-19 DIAGNOSIS — E11621 Type 2 diabetes mellitus with foot ulcer: Secondary | ICD-10-CM | POA: Diagnosis present

## 2013-02-19 DIAGNOSIS — M86679 Other chronic osteomyelitis, unspecified ankle and foot: Secondary | ICD-10-CM | POA: Diagnosis present

## 2013-02-19 HISTORY — PX: AMPUTATION: SHX166

## 2013-02-19 LAB — BASIC METABOLIC PANEL
BUN: 8 mg/dL (ref 6–23)
CO2: 24 mEq/L (ref 19–32)
Chloride: 101 mEq/L (ref 96–112)
Creatinine, Ser: 0.5 mg/dL (ref 0.50–1.10)
Potassium: 3.9 mEq/L (ref 3.5–5.1)

## 2013-02-19 LAB — GLUCOSE, CAPILLARY
Glucose-Capillary: 191 mg/dL — ABNORMAL HIGH (ref 70–99)
Glucose-Capillary: 146 mg/dL — ABNORMAL HIGH (ref 70–99)
Glucose-Capillary: 145 mg/dL — ABNORMAL HIGH (ref 70–99)
Glucose-Capillary: 148 mg/dL — ABNORMAL HIGH (ref 70–99)
Glucose-Capillary: 153 mg/dL — ABNORMAL HIGH (ref 70–99)

## 2013-02-19 LAB — URINE CULTURE: Colony Count: 6000

## 2013-02-19 SURGERY — AMPUTATION, FOOT, RAY
Anesthesia: Monitor Anesthesia Care | Site: Toe | Laterality: Left | Wound class: Dirty or Infected

## 2013-02-19 MED ORDER — ONDANSETRON HCL 4 MG/2ML IJ SOLN
INTRAMUSCULAR | Status: DC | PRN
  Administered 2013-02-19: 4 mg via INTRAVENOUS

## 2013-02-19 MED ORDER — LACTATED RINGERS IV SOLN
INTRAVENOUS | Status: DC
  Administered 2013-02-19: 16:00:00 via INTRAVENOUS

## 2013-02-19 MED ORDER — PROPOFOL 10 MG/ML IV BOLUS
INTRAVENOUS | Status: DC | PRN
  Administered 2013-02-19: 20 mg via INTRAVENOUS

## 2013-02-19 MED ORDER — LACTATED RINGERS IV SOLN
INTRAVENOUS | Status: DC | PRN
  Administered 2013-02-19: 16:00:00 via INTRAVENOUS

## 2013-02-19 MED ORDER — FENTANYL CITRATE 0.05 MG/ML IJ SOLN
INTRAMUSCULAR | Status: DC | PRN
  Administered 2013-02-19: 50 ug via INTRAVENOUS

## 2013-02-19 MED ORDER — FENTANYL CITRATE 0.05 MG/ML IJ SOLN
50.0000 ug | INTRAMUSCULAR | Status: DC | PRN
  Administered 2013-02-19: 100 ug via INTRAVENOUS

## 2013-02-19 MED ORDER — MIDAZOLAM HCL 5 MG/5ML IJ SOLN
INTRAMUSCULAR | Status: DC | PRN
  Administered 2013-02-19 (×2): 1 mg via INTRAVENOUS

## 2013-02-19 MED ORDER — PROPOFOL INFUSION 10 MG/ML OPTIME
INTRAVENOUS | Status: DC | PRN
  Administered 2013-02-19: 50 ug/kg/min via INTRAVENOUS

## 2013-02-19 MED ORDER — 0.9 % SODIUM CHLORIDE (POUR BTL) OPTIME
TOPICAL | Status: DC | PRN
  Administered 2013-02-19: 1000 mL

## 2013-02-19 MED ORDER — ONDANSETRON HCL 4 MG/2ML IJ SOLN: 4.0000 mg | Freq: Once | INTRAMUSCULAR | Status: DC | PRN

## 2013-02-19 MED ORDER — FENTANYL CITRATE 0.05 MG/ML IJ SOLN
INTRAMUSCULAR | Status: AC
  Filled 2013-02-19: qty 2

## 2013-02-19 MED ORDER — HYDROMORPHONE HCL PF 1 MG/ML IJ SOLN: 0.2500 mg | INTRAMUSCULAR | Status: DC | PRN

## 2013-02-19 MED ORDER — BUPIVACAINE-EPINEPHRINE PF 0.5-1:200000 % IJ SOLN
INTRAMUSCULAR | Status: DC | PRN
  Administered 2013-02-19: 15 mL

## 2013-02-19 SURGICAL SUPPLY — 39 items
BANDAGE ESMARK 6X9 LF (GAUZE/BANDAGES/DRESSINGS) ×1 IMPLANT
BANDAGE GAUZE ELAST BULKY 4 IN (GAUZE/BANDAGES/DRESSINGS) ×2 IMPLANT
BLADE SURG 10 STRL SS (BLADE) IMPLANT
BNDG COHESIVE 4X5 TAN STRL (GAUZE/BANDAGES/DRESSINGS) ×2 IMPLANT
BNDG ESMARK 6X9 LF (GAUZE/BANDAGES/DRESSINGS) ×2
CLOTH BEACON ORANGE TIMEOUT ST (SAFETY) ×2 IMPLANT
CUFF TOURNIQUET SINGLE 34IN LL (TOURNIQUET CUFF) IMPLANT
CUFF TOURNIQUET SINGLE 44IN (TOURNIQUET CUFF) IMPLANT
DRAPE U-SHAPE 47X51 STRL (DRAPES) ×2 IMPLANT
DURAPREP 26ML APPLICATOR (WOUND CARE) IMPLANT
ELECT REM PT RETURN 9FT ADLT (ELECTROSURGICAL) ×2
ELECTRODE REM PT RTRN 9FT ADLT (ELECTROSURGICAL) ×1 IMPLANT
GAUZE XEROFORM 5X9 LF (GAUZE/BANDAGES/DRESSINGS) ×2 IMPLANT
GLOVE BIO SURGEON STRL SZ7.5 (GLOVE) ×2 IMPLANT
GLOVE BIOGEL PI IND STRL 8 (GLOVE) ×1 IMPLANT
GLOVE BIOGEL PI INDICATOR 8 (GLOVE) ×1
GLOVE ORTHO TXT STRL SZ7.5 (GLOVE) ×2 IMPLANT
GOWN PREVENTION PLUS LG XLONG (DISPOSABLE) IMPLANT
GOWN PREVENTION PLUS XLARGE (GOWN DISPOSABLE) ×4 IMPLANT
GOWN STRL NON-REIN LRG LVL3 (GOWN DISPOSABLE) ×2 IMPLANT
KIT BASIN OR (CUSTOM PROCEDURE TRAY) ×2 IMPLANT
KIT ROOM TURNOVER OR (KITS) ×2 IMPLANT
NS IRRIG 1000ML POUR BTL (IV SOLUTION) ×2 IMPLANT
PACK ORTHO EXTREMITY (CUSTOM PROCEDURE TRAY) ×2 IMPLANT
PAD ARMBOARD 7.5X6 YLW CONV (MISCELLANEOUS) ×4 IMPLANT
PAD CAST 4YDX4 CTTN HI CHSV (CAST SUPPLIES) IMPLANT
PADDING CAST COTTON 4X4 STRL (CAST SUPPLIES)
SCRUB BETADINE 4OZ XXX (MISCELLANEOUS) ×2 IMPLANT
SOLUTION BETADINE 4OZ (MISCELLANEOUS) ×2 IMPLANT
SPONGE GAUZE 4X4 12PLY (GAUZE/BANDAGES/DRESSINGS) ×2 IMPLANT
SPONGE LAP 18X18 X RAY DECT (DISPOSABLE) ×4 IMPLANT
STAPLER VISISTAT 35W (STAPLE) IMPLANT
STOCKINETTE IMPERVIOUS LG (DRAPES) IMPLANT
SUT ETHILON 2 0 PSLX (SUTURE) ×2 IMPLANT
TOWEL OR 17X24 6PK STRL BLUE (TOWEL DISPOSABLE) ×2 IMPLANT
TOWEL OR 17X26 10 PK STRL BLUE (TOWEL DISPOSABLE) ×2 IMPLANT
TUBE CONNECTING 12X1/4 (SUCTIONS) ×2 IMPLANT
WATER STERILE IRR 1000ML POUR (IV SOLUTION) IMPLANT
YANKAUER SUCT BULB TIP NO VENT (SUCTIONS) ×2 IMPLANT

## 2013-02-19 NOTE — Anesthesia Procedure Notes (Signed)
Anesthesia Regional Block:  Ankle block  Pre-Anesthetic Checklist: ,, timeout performed, Correct Patient, Correct Site, Correct Laterality, Correct Procedure, Correct Position, site marked, Risks and benefits discussed,  Surgical consent,  Pre-op evaluation,  At surgeon's request and post-op pain management  Laterality: Left  Prep: Maximum Sterile Barrier Precautions used, chloraprep and alcohol swabs       Needles:  Injection technique: Single-shot      Needle Gauge: 25 and 25 G  Needle insertion depth: 4 cm   Additional Needles: Ankle block Narrative:  Start time: 02/19/2013 3:45 PM End time: 02/19/2013 3:51 PM Injection made incrementally with aspirations every 5 mL.  Performed by: Personally  Anesthesiologist: Maren Beach MD  Additional Notes: Pt accepts procedure w/ risks. 30cc ( 15cc 0.5% Marcaine w/ epi and 15cc 2% Lidocaine )  Ant. Tibial and Post Tibial nerves.

## 2013-02-19 NOTE — Preoperative (Signed)
Beta Blockers   Reason not to administer Beta Blockers:Not Applicable 

## 2013-02-19 NOTE — Progress Notes (Signed)
Inpatient Diabetes Program Recommendations  AACE/ADA: New Consensus Statement on Inpatient Glycemic Control (2013)  Target Ranges:  Prepandial:   less than 140 mg/dL      Peak postprandial:   less than 180 mg/dL (1-2 hours)      Critically ill patients:  140 - 180 mg/dL    Results for ELLERY, TASH (MRN [id number]) as of 02/19/2013 10:08  Ref. Range 02/18/2013 07:43 02/18/2013 11:22 02/18/2013 16:30 02/18/2013 21:29  Glucose-Capillary Latest Range: 70-99 mg/dL 409 (H) 811 (H) 914 (H) 196 (H)   Results for IYSIS, GERMAIN (MRN [id number]) as of 02/19/2013 10:08  Ref. Range 02/19/2013 07:52  Glucose-Capillary Latest Range: 70-99 mg/dL 086 (H)    Noted Novolog 3 units tid with meals (meal coverage) started this morning.  Meal coverage not recommended to be given with 70/30 insulin since 70/30 insulin already has a "built-in" meal coverage component.  Recommend the following: 1. Discontinue Novolog 3 units tid with meals 2. Increase AM 70/30 insulin dose to 48 units with breakfast 3. Increase PM 70/30 insulin dose to 38 units with supper  Will follow. Ambrose Finland RN, MSN, CDE Diabetes Coordinator Inpatient Diabetes Program [phone]

## 2013-02-19 NOTE — Transfer of Care (Signed)
Immediate Anesthesia Transfer of Care Note  Patient: [name]  Procedure(s) Performed: Procedure(s): Amputation of Left Great Toe (Left)  Patient Location: PACU  Anesthesia Type:MAC  Level of Consciousness: awake, alert  and oriented  Airway & Oxygen Therapy: Patient Spontanous Breathing and Patient connected to nasal cannula oxygen  Post-op Assessment: Report given to PACU RN, Post -op Vital signs reviewed and stable and Patient moving all extremities X 4  Post vital signs: Reviewed and stable  Complications: No apparent anesthesia complications

## 2013-02-19 NOTE — Progress Notes (Signed)
Orthopedic Tech Progress Note Patient Details:  [name] 1961/10/07 [id number]  Ortho Devices Type of Ortho Device: Postop shoe/boot Ortho Device/Splint Location: (L) LE Ortho Device/Splint Interventions: Application;Ordered   [name] 02/19/2013, 7:05 PM

## 2013-02-19 NOTE — Progress Notes (Signed)
Pt c/o redness & hives at IV site while Vancy was running. Rate decreased and the burning as well as hives went away. Ice was applied. MD as well as pharmacy made aware. Will continue to monitor the pt. Sanda Linger, RN

## 2013-02-19 NOTE — H&P (Signed)
  [name] understands fully that we are going to the OR to proceed with a left great toe amputation due to overwhelming infection.

## 2013-02-19 NOTE — Anesthesia Preprocedure Evaluation (Addendum)
Anesthesia Evaluation  Patient identified by MRN, date of birth, ID band Patient awake    Reviewed: Allergy & Precautions, H&P , NPO status , Patient's Chart, lab work & pertinent test results, reviewed documented beta blocker date and time   History of Anesthesia Complications Negative for: history of anesthetic complications  Airway Mallampati: I TM Distance: >3 FB Neck ROM: full    Dental  (+) Dental Advisory Given and Chipped,    Pulmonary asthma ,          Cardiovascular hypertension, Pt. on medications + Peripheral Vascular Disease Rhythm:regular Rate:Normal     Neuro/Psych negative neurological ROS  negative psych ROS   GI/Hepatic Neg liver ROS, GERD-  Controlled,  Endo/Other  diabetes, Type 2, Oral Hypoglycemic Agents and Insulin Dependent  Renal/GU Renal disease     Musculoskeletal   Abdominal   Peds  Hematology   Anesthesia Other Findings Pt states two teeth broken bottom right  Reproductive/Obstetrics                        Anesthesia Physical Anesthesia Plan  ASA: III  Anesthesia Plan: MAC   Post-op Pain Management: MAC Combined w/ Regional for Post-op pain   Induction: Intravenous  Airway Management Planned: Simple Face Mask  Additional Equipment:   Intra-op Plan:   Post-operative Plan:   Informed Consent: I have reviewed the patients History and Physical, chart, labs and discussed the procedure including the risks, benefits and alternatives for the proposed anesthesia with the patient or authorized representative who has indicated his/her understanding and acceptance.     Plan Discussed with: Anesthesiologist, CRNA and Surgeon  Anesthesia Plan Comments:         Anesthesia Quick Evaluation

## 2013-02-19 NOTE — Anesthesia Postprocedure Evaluation (Signed)
  Anesthesia Post-op Note  Patient: [name]  Procedure(s) Performed: Procedure(s): Amputation of Left Great Toe (Left)  Patient Location: PACU  Anesthesia Type:MAC combined with regional for post-op pain  Level of Consciousness: awake, alert  and oriented  Airway and Oxygen Therapy: Patient Spontanous Breathing and Patient connected to nasal cannula oxygen  Post-op Pain: none  Post-op Assessment: Post-op Vital signs reviewed  Post-op Vital Signs: Reviewed  Complications: No apparent anesthesia complications

## 2013-02-19 NOTE — Care Management Note (Unsigned)
    Page 1 of 2   02/19/2013     3:22:57 PM   CARE MANAGEMENT NOTE 02/19/2013  Patient:  [name], [name]   Account Number:  [id number]  Date Initiated:  02/19/2013  Documentation initiated by:  GRAVES-BIGELOW,Zachory Mangual  Subjective/Objective Assessment:   Pt admitted with hypotension receiving fluid bolus in ED. Has a diabetic foot ulcer and plan for Left great toe amputation. Pt is from home with adult children.     Action/Plan:   CM did discuss PCP with pt and she is currently going to the [facility]gent [facility]. CM did call the family Practice Clinic to see if accepting new pts and they are not. CM also called the Internal Medicine Clinic   Anticipated DC Date:  02/23/2013   Anticipated DC Plan:  HOME W HOME HEALTH SERVICES  In-house referral  Financial Counselor      DC Planning Services  CM consult      Choice offered to / List presented to:             Status of service:  In process, will continue to follow Medicare Important Message given?   (If response is "NO", the following Medicare IM given date fields will be blank) Date Medicare IM given:   Date Additional Medicare IM given:    Discharge Disposition:    Per UR Regulation:  Reviewed for med. necessity/level of care/duration of stay  If discussed at Long Length of Stay Meetings, dates discussed:    Comments:  02-19-13 [address], [place] [phone] CM did call the Internal Medicine Clinic and spoke to office Manager Doris and she reviewed the pt's case and is willing to take her as a new pt. The appointment will be for April 3rd Thursday at 10:00 am  with MD Saralyn Pilar and pt is aware. If pt is not out by this date please call and reschedulle appointment. The Diabetes Coordinator will f/u from the Internal Medicine Clinic on tomorrow. CM will continue to monitor for additional needs.   02-19-13 [address] [place] [phone] CM did call the [facility] to see if pt is eligible for  mediciad/ disability. Pt will need to fill out financial assistance program for assistacne with bill. CM will also call back to the Internal Medicine Clinic to see if they are accepting new pt's. Pt has her orange card via Urgent Care and has been getting her meds via the HD. CM will continue to monitor for

## 2013-02-19 NOTE — Brief Op Note (Signed)
02/17/2013 - 02/19/2013  5:12 PM  PATIENT:  [name]  51 y.o. female  PRE-OPERATIVE DIAGNOSIS:  Left Great Toe Infection  POST-OPERATIVE DIAGNOSIS:  Left Great Toe Infection  PROCEDURE:  Procedure(s): Amputation of Left Great Toe (Left)  SURGEON:  Surgeon(s) and Role:    * Kathryne Hitch, MD - Primary  PHYSICIAN ASSISTANT: Rexene Edison, PA-C   ANESTHESIA:   regional and IV sedation  EBL:  Total I/O In: 400 [I.V.:400] Out: -   BLOOD ADMINISTERED:none  DRAINS: none   LOCAL MEDICATIONS USED:  NONE  SPECIMEN:  No Specimen  DISPOSITION OF SPECIMEN:  N/A  COUNTS:  YES  TOURNIQUET:    DICTATION: .Other Dictation: Dictation Number [id number]  PLAN OF CARE: Admit to inpatient   PATIENT DISPOSITION:  PACU - hemodynamically stable.   Delay start of Pharmacological VTE agent (>24hrs) due to surgical blood loss or risk of bleeding: not applicable

## 2013-02-19 NOTE — Progress Notes (Signed)
[facility] PROGRESS NOTE  [name] ZOX:[id number] DOB: [birth year] DOA: 02/17/2013 PCP: Pcp Not In System  Assessment/Plan:  1. Diabetic left foot ulcer On iv antibiotics vancomycin and levaquin day 2.  - mri of the foot showed chronic osteomyelitis - orthopedic recommendations appreciated. She is going to OR for amputation of left great toe. NPO after breakfast. Pain control. Will plan for PT/OT EVAL tomorrow.   2. Dehydration: appears to have resolved. Repeat labs in am.   3. Acute renal failure/hyponatremia : probably pre renal. She received IV FLUDS at 125/hr. Repeat labs pending today.   4. Diabetes Mellitus.: uncontrolled. hgba1c is pending.  CBG (last 3)   Recent Labs  02/18/13 1630 02/18/13 2129 02/19/13 0752  GLUCAP 266* 196* 255*    Continue with 70/30 mixture and SSI.   5. Abnormal UA: probably contamination. Urine cultures pending.    4. Dvt prophylaxis   Consultants:  Orthopedics.  HPI/Subjective: Comfortable. SLIGHTLY at the toe  Objective: Filed Vitals:   02/18/13 0603 02/18/13 1311 02/18/13 2016 02/19/13 0616  BP: 107/68 88/51 100/82 115/64  Pulse: 89 91 98 77  Temp: 99.3 F (37.4 C) 99 F (37.2 C) 98.5 F (36.9 C) 98.2 F (36.8 C)  TempSrc: Oral Oral Oral Oral  Resp: 20 20 20 19   Height: 6\' 1"  (1.854 m)     Weight: 121 kg (266 lb 12.1 oz)     SpO2: 97% 100% 100% 100%    Intake/Output Summary (Last 24 hours) at 02/19/13 1117 Last data filed at 02/19/13 0700  Gross per 24 hour  Intake 3552.5 ml  Output   2600 ml  Net  952.5 ml   Filed Weights   02/18/13 0603  Weight: 121 kg (266 lb 12.1 oz)    Exam: Lungs: clear to ascultation, no wheeze, no crackles, no use of accessory muscles  Cardiovascular: regular rate and rhythm, no regurgitation, no gallops, no murmurs. No carotid bruits, no JVD  Abdomen: soft, positive BS, non-tender, non-distended, no organomegaly, not an acute abdomen  GU: not examined  Neuro: CN II - XII  grossly intact, sensation intact  Musculoskeletal: strength 5/5 all extremities, no clubbing, cyanosis or edema, right toe diabetic foot ulcer  Skin: no rash, no subcutaneous crepitation, no decubitus  Psych: appropriate patient     Data Reviewed: Basic Metabolic Panel:  Recent Labs Lab 02/17/13 2301  NA 131*  K 4.2  CL 93*  CO2 21  GLUCOSE 246*  BUN 23  CREATININE 1.67*  CALCIUM 9.3   Liver Function Tests:  Recent Labs Lab 02/17/13 2301  AST 18  ALT 21  ALKPHOS 77  BILITOT 0.5  PROT 7.5  ALBUMIN 3.4*   No results found for this basename: LIPASE, AMYLASE,  in the last 168 hours No results found for this basename: AMMONIA,  in the last 168 hours CBC:  Recent Labs Lab 02/17/13 2301  WBC 8.6  NEUTROABS 6.7  HGB 13.9  HCT 41.0  MCV 91.3  PLT 206   Cardiac Enzymes: No results found for this basename: CKTOTAL, CKMB, CKMBINDEX, TROPONINI,  in the last 168 hours BNP (last 3 results) No results found for this basename: PROBNP,  in the last 8760 hours CBG:  Recent Labs Lab 02/18/13 0743 02/18/13 1122 02/18/13 1630 02/18/13 2129 02/19/13 0752  GLUCAP 277* 214* 266* 196* 255*    Recent Results (from the past 240 hour(s))  CULTURE, BLOOD (ROUTINE X 2)     Status: None   Collection Time  02/17/13 11:20 PM      Result Value Range Status   Specimen Description BLOOD RIGHT ARM   Final   Special Requests BOTTLES DRAWN AEROBIC AND ANAEROBIC 5CC EA   Final   Culture  Setup Time 02/18/2013 09:28   Final   Culture     Final   Value:        BLOOD CULTURE RECEIVED NO GROWTH TO DATE CULTURE WILL BE HELD FOR 5 DAYS BEFORE ISSUING A FINAL NEGATIVE REPORT   Report Status PENDING   Incomplete  CULTURE, BLOOD (ROUTINE X 2)     Status: None   Collection Time    02/17/13 11:40 PM      Result Value Range Status   Specimen Description BLOOD RIGHT ARM   Final   Special Requests BOTTLES DRAWN AEROBIC ONLY 8CC   Final   Culture  Setup Time 02/18/2013 09:27   Final    Culture     Final   Value:        BLOOD CULTURE RECEIVED NO GROWTH TO DATE CULTURE WILL BE HELD FOR 5 DAYS BEFORE ISSUING A FINAL NEGATIVE REPORT   Report Status PENDING   Incomplete  SURGICAL PCR SCREEN     Status: None   Collection Time    02/18/13  8:41 PM      Result Value Range Status   MRSA, PCR NEGATIVE  NEGATIVE Final   Staphylococcus aureus NEGATIVE  NEGATIVE Final   Comment:            The Xpert SA Assay (FDA     approved for NASAL specimens     in patients over 36 years of age),     is one component of     a comprehensive surveillance     program.  Test performance has     been validated by [employer] for patients greater     than or equal to 54 year old.     It is not intended     to diagnose infection nor to     guide or monitor treatment.     Studies: Mr Foot Left Wo Contrast  02/18/2013  *RADIOLOGY REPORT*  Clinical Data: Left great toe pain and swelling with open wound. Evaluate for osteomyelitis.  MRI OF THE LEFT FOREFOOT WITHOUT CONTRAST  Technique:  Multiplanar, multisequence MR imaging was performed. No intravenous contrast was administered.  Comparison: Radiographs 03/16/2011 through 02/18/2013.  MRI 09/30/2010.  Findings: There is chronic soft tissue edema within the great toe, especially distally within the nail bed.  No focal fluid collection is identified.  There is no interphalangeal or metatarsal phalangeal joint effusion. Chronic subcutaneous scarring plantar to the second proximal phalanx appears unchanged.  There is stable diffuse forefoot muscular atrophy.  The distal phalanx of the great toe demonstrates chronic tuftal cortical erosion compared with the prior MRI and with older radiographs. There is no associated marrow edema.  The proximal phalanx appears normal.  The additional toes and metatarsals appear normal.  The fibular sesamoid of the first metatarsal is bipartite.  IMPRESSION:  1.  Chronic tuftal cortical erosion of the distal phalanx of the  great toe without associated marrow edema.  Given the history of diabetes and the adjacent inflammatory changes, findings are suspicious for chronic or previously treated osteomyelitis. 2.  No evidence of acute osteomyelitis or drainable soft tissue abscess.   Original Report Authenticated By: Carey Bullocks, M.D.    Dg Chest Portable  1 View  02/17/2013  *RADIOLOGY REPORT*  Clinical Data: The T and weakness.  PORTABLE CHEST - 1 VIEW  Comparison: 08/12/2011.  Findings: The cardiac silhouette, mediastinal and hilar contours are within normal limits and stable. The lungs are clear.  No pleural effusions.  The bony thorax is intact.  IMPRESSION: Normal chest x-ray.   Original Report Authenticated By: Rudie Meyer, M.D.    Dg Foot 2 Views Left  02/18/2013  *RADIOLOGY REPORT*  Clinical Data: Diabetic foot ulcer on the left great toe.  LEFT FOOT - 2 VIEW  Comparison: None.  Findings: Iodoform dressing is present in the left great toe tip. There is no osteolysis of the great toe to suggest osteomyelitis. Comparing the cortex and trabecular markings of the great toe terminal phalanx to the prior radiographs of 2012, there is no interval change.  No gas is present in the soft tissues.  Calcaneal spurs incidentally noted.  Small vessel atherosclerosis.  Flexion of the toes is present on the frontal view of the foot.  IMPRESSION: Distal great toe ulceration with a radiopaque dressing.  No osteomyelitis.   Original Report Authenticated By: Andreas Newport, M.D.     Scheduled Meds: . enoxaparin (LOVENOX) injection  60 mg Subcutaneous Q24H  . insulin aspart  0-15 Units Subcutaneous TID WC  . insulin aspart  0-5 Units Subcutaneous QHS  . insulin aspart  3 Units Subcutaneous TID WC  . insulin aspart protamine-insulin aspart  35 Units Subcutaneous Q supper  . insulin aspart protamine-insulin aspart  45 Units Subcutaneous Q breakfast  . levofloxacin (LEVAQUIN) IV  750 mg Intravenous Q24H  . vancomycin  1,250 mg  Intravenous Q12H   Continuous Infusions: . sodium chloride 125 mL/hr (02/19/13 0459)    Active Problems:   DIABETES MELLITUS, TYPE II   DIABETIC PERIPHERAL NEUROPATHY   HYPERTENSION, BENIGN ESSENTIAL   DIABETIC FOOT ULCER, TOE   Gastroenteritis   Acute kidney injury   Dehydration        Selin Eisler  [facility] Pager [phone]. If 7PM-7AM, please contact night-coverage at [email or web address], password [facility] 02/19/2013, 11:17 AM  LOS: 2 days

## 2013-02-20 ENCOUNTER — Telehealth: Payer: Self-pay | Admitting: Dietician

## 2013-02-20 DIAGNOSIS — E119 Type 2 diabetes mellitus without complications: Secondary | ICD-10-CM

## 2013-02-20 DIAGNOSIS — E1149 Type 2 diabetes mellitus with other diabetic neurological complication: Secondary | ICD-10-CM

## 2013-02-20 DIAGNOSIS — I10 Essential (primary) hypertension: Secondary | ICD-10-CM

## 2013-02-20 LAB — CBC
HCT: 34.1 % — ABNORMAL LOW (ref 36.0–46.0)
MCV: 92.9 fL (ref 78.0–100.0)
Platelets: 215 10*3/uL (ref 150–400)
RBC: 3.67 MIL/uL — ABNORMAL LOW (ref 3.87–5.11)
WBC: 5.8 10*3/uL (ref 4.0–10.5)

## 2013-02-20 LAB — BASIC METABOLIC PANEL
CO2: 25 mEq/L (ref 19–32)
Chloride: 105 mEq/L (ref 96–112)
Creatinine, Ser: 0.48 mg/dL — ABNORMAL LOW (ref 0.50–1.10)
Potassium: 4.2 mEq/L (ref 3.5–5.1)

## 2013-02-20 LAB — HEMOGLOBIN A1C: Hgb A1c MFr Bld: 11.5 % — ABNORMAL HIGH (ref <5.7)

## 2013-02-20 LAB — GLUCOSE, CAPILLARY
Glucose-Capillary: 231 mg/dL — ABNORMAL HIGH (ref 70–99)
Glucose-Capillary: 250 mg/dL — ABNORMAL HIGH (ref 70–99)

## 2013-02-20 MED ORDER — HYDROCODONE-ACETAMINOPHEN 5-325 MG PO TABS: 1.0000 | ORAL_TABLET | ORAL | Status: DC | PRN

## 2013-02-20 MED ORDER — HYDROCHLOROTHIAZIDE 12.5 MG PO TABS: 12.5000 mg | ORAL_TABLET | Freq: Every day | ORAL | Status: DC

## 2013-02-20 MED ORDER — LISINOPRIL 10 MG PO TABS: 10.0000 mg | ORAL_TABLET | Freq: Every day | ORAL | Status: DC

## 2013-02-20 MED ORDER — DOXYCYCLINE HYCLATE 100 MG PO TABS: 100.0000 mg | ORAL_TABLET | Freq: Two times a day (BID) | ORAL | Status: DC

## 2013-02-20 NOTE — Discharge Summary (Signed)
Physician Discharge Summary  [name] ZOX:[id number] DOB: [birth year] DOA: 02/17/2013  PCP: Pcp Not In System  Admit date: 02/17/2013 Discharge date: 02/20/2013  Time spent: >30 minutes  Recommendations for Outpatient Follow-up:  BMET to follow renal function and electrolytes Close follow up of her diabetes and medication adjustments as needed Reassess BP and adjust medications if required  Discharge Diagnoses:  Active Problems:   DIABETES MELLITUS, TYPE II   DIABETIC PERIPHERAL NEUROPATHY   HYPERTENSION, BENIGN ESSENTIAL   DIABETIC FOOT ULCER, TOE   Gastroenteritis   Acute kidney injury   Dehydration   Chronic osteomyelitis of foot   DM type 2 with diabetic foot ulcer   Osteomyelitis of toe of left foot   Discharge Condition: stable and improved. Discharged home with DME's to facilitate ambulation. Will follow with PCP and orthopedic service as an outpatient.  Diet recommendation: low carb diet   Filed Weights   02/18/13 0603  Weight: 121 kg (266 lb 12.1 oz)    History of present illness:  52 year old female who states that since Tuesday she's had intermittent nausea, vomiting and diarrhea, no evidence of bleeding. On Friday she vomited most of the day.She's had subjective fevers and chills. Today she just simply did not feel right, she felt weak, the symptoms continued to get worse during the course of the day, she came to the ER. She reports pain in the left foot above the ankle. She is a chronic ulcer in the left great toe for which he goes to wound care clinic on [employer]. Her toe was significantly debrided on Friday, she has not seen it since it has remained bandaged. She is on no antibiotics. On presentation to the ER the patient was hypotensive with a blood pressure 78/50, she was also febrile with a MAXIMUM TEMPERATURE of 103.2.   Hospital Course:  1. Diabetic left foot ulcer/osteomyelitis affecting great toe - mri of the foot showed chronic osteomyelitis  -  orthopedic recommended left great toe amputation; which happened on 02/19/13, no complications. -blood cx's remains negative and patient non toxic. -plan is for 2 weeks of doxycycline and follow up with Dr. Magnus Ivan as an outpatient for further post-surgical eval and treatment. -good control of her diabetes  2. Dehydration: Resolved with IVF's; tolerating diet  And medications at discharge. BMET essentially WNL at discharge.   3. Acute renal failure/hyponatremia : probably pre-renal. Repeat labs demonstrated resolution of her renal failure. BMET as an outpatient to follow renal function (especially after resuming ACE and HCTZ).   4. Diabetes Mellitus.: uncontrolled. hgb a1c 11.5 .  CBG (last 3)   Recent Labs   02/18/13 1630  02/18/13 2129  02/19/13 0752   GLUCAP  266*  196*  255*    Continue with 70/30 mixture and SSI; dose adjusted. Patient will need close follow up and further adjustements as an outpatient. She reports no the best compliance with insulin therapy as well prior to admission.   5. Abnormal UA: probably contamination. Urine cultures w/o significant growth. Antibiotics given since admission until discharge cover any UTI as well.  6. HTN: HCTZ and lisinopril resumed at discharge. BP well controlled.  *Rest of medical problems remains stable and the plan is to continue home medication regimen.  Procedures:  Left great toe amputation  Consultations:  Ortho  PT/OT  Discharge Exam: Filed Vitals:   02/19/13 1820 02/19/13 2100 02/20/13 0613 02/20/13 1300  BP:  112/60 123/66 128/63  Pulse:  87 75 89  Temp: 97  F (36.1 C) 98.5 F (36.9 C) 97.9 F (36.6 C) 98.7 F (37.1 C)  TempSrc:  Oral Oral Oral  Resp:  20 20 20   Height:      Weight:      SpO2:  100% 99%     General: NAD, afebrile, cooperative to examination and complaining of just minimal discomfort on her left foot Cardiovascular: S1 and S2, no rubs or gallops Respiratory: CTA bilaterally Abdomen:  soft, NT, ND, positive BS Extremities: clean dressing on her left foot, minimal pain; trace edema bilaterally; decrease pinprick due to neuropathy on both LE   Discharge Instructions  Discharge Orders   Future Appointments Provider Department Dept Phone   02/28/2013 10:00 AM Priscella Mann, DO [facility] [phone]   02/28/2013 10:30 AM Cecil Cranker Plyler, RD [facility] [phone]   Future Orders Complete By Expires     DME Bedside commode  As directed     Diet - low sodium heart healthy  As directed     Discharge instructions  As directed     Comments:      -Follow medications as prescribed -Follow with PCP to establish care as instructed -follow heart healthy diet    For home use only DME Walker  As directed     Increase activity slowly  As directed         Medication List    STOP taking these medications       traMADol 50 MG tablet  Commonly known as:  ULTRAM      TAKE these medications       doxycycline 100 MG tablet  Commonly known as:  VIBRA-TABS  Take 1 tablet (100 mg total) by mouth 2 (two) times daily.     hydrochlorothiazide 12.5 MG tablet  Commonly known as:  HYDRODIURIL  Take 1 tablet (12.5 mg total) by mouth daily.     HYDROcodone-acetaminophen 5-325 MG per tablet  Commonly known as:  NORCO/VICODIN  Take 1-2 tablets by mouth every 4 (four) hours as needed.     insulin NPH-insulin regular (70-30) 100 UNIT/ML injection  Commonly known as:  NOVOLIN 70/30  Inject 35-45 Units into the skin 2 (two) times daily with a meal. Take 45 units Q AM and 35 units Q PM     lisinopril 10 MG tablet  Commonly known as:  PRINIVIL,ZESTRIL  Take 1 tablet (10 mg total) by mouth daily.     metFORMIN 1000 MG tablet  Commonly known as:  GLUCOPHAGE  Take 1,000 mg by mouth 2 (two) times daily with a meal.           Follow-up Information   Follow up with Johnette Abraham, DO On 02/28/2013. (@ 10:00 am please  bring d/c paperwork/ medication list)    Contact information:   [facility] [address] [place] [address] [phone]       Follow up with Kathryne Hitch, MD. Schedule an appointment as soon as possible for a visit in 2 weeks.   Contact information:   [address] [place] [address] [phone]       The results of significant diagnostics from this hospitalization (including imaging, microbiology, ancillary and laboratory) are listed below for reference.    Significant Diagnostic Studies: Mr Foot Left Wo Contrast  02/18/2013  *RADIOLOGY REPORT*  Clinical Data: Left great toe pain and swelling with open wound. Evaluate for osteomyelitis.  MRI OF THE LEFT FOREFOOT WITHOUT  CONTRAST  Technique:  Multiplanar, multisequence MR imaging was performed. No intravenous contrast was administered.  Comparison: Radiographs 03/16/2011 through 02/18/2013.  MRI 09/30/2010.  Findings: There is chronic soft tissue edema within the great toe, especially distally within the nail bed.  No focal fluid collection is identified.  There is no interphalangeal or metatarsal phalangeal joint effusion. Chronic subcutaneous scarring plantar to the second proximal phalanx appears unchanged.  There is stable diffuse forefoot muscular atrophy.  The distal phalanx of the great toe demonstrates chronic tuftal cortical erosion compared with the prior MRI and with older radiographs. There is no associated marrow edema.  The proximal phalanx appears normal.  The additional toes and metatarsals appear normal.  The fibular sesamoid of the first metatarsal is bipartite.  IMPRESSION:  1.  Chronic tuftal cortical erosion of the distal phalanx of the great toe without associated marrow edema.  Given the history of diabetes and the adjacent inflammatory changes, findings are suspicious for chronic or previously treated osteomyelitis. 2.  No evidence of acute osteomyelitis or drainable soft tissue  abscess.   Original Report Authenticated By: Carey Bullocks, M.D.    Dg Chest Portable 1 View  02/17/2013  *RADIOLOGY REPORT*  Clinical Data: The T and weakness.  PORTABLE CHEST - 1 VIEW  Comparison: 08/12/2011.  Findings: The cardiac silhouette, mediastinal and hilar contours are within normal limits and stable. The lungs are clear.  No pleural effusions.  The bony thorax is intact.  IMPRESSION: Normal chest x-ray.   Original Report Authenticated By: Rudie Meyer, M.D.    Dg Foot 2 Views Left  02/18/2013  *RADIOLOGY REPORT*  Clinical Data: Diabetic foot ulcer on the left great toe.  LEFT FOOT - 2 VIEW  Comparison: None.  Findings: Iodoform dressing is present in the left great toe tip. There is no osteolysis of the great toe to suggest osteomyelitis. Comparing the cortex and trabecular markings of the great toe terminal phalanx to the prior radiographs of 2012, there is no interval change.  No gas is present in the soft tissues.  Calcaneal spurs incidentally noted.  Small vessel atherosclerosis.  Flexion of the toes is present on the frontal view of the foot.  IMPRESSION: Distal great toe ulceration with a radiopaque dressing.  No osteomyelitis.   Original Report Authenticated By: Andreas Newport, M.D.     Microbiology: Recent Results (from the past 240 hour(s))  CULTURE, BLOOD (ROUTINE X 2)     Status: None   Collection Time    02/17/13 11:20 PM      Result Value Range Status   Specimen Description BLOOD RIGHT ARM   Final   Special Requests BOTTLES DRAWN AEROBIC AND ANAEROBIC 5CC EA   Final   Culture  Setup Time 02/18/2013 09:28   Final   Culture     Final   Value:        BLOOD CULTURE RECEIVED NO GROWTH TO DATE CULTURE WILL BE HELD FOR 5 DAYS BEFORE ISSUING A FINAL NEGATIVE REPORT   Report Status PENDING   Incomplete  CULTURE, BLOOD (ROUTINE X 2)     Status: None   Collection Time    02/17/13 11:40 PM      Result Value Range Status   Specimen Description BLOOD RIGHT ARM   Final   Special  Requests BOTTLES DRAWN AEROBIC ONLY 8CC   Final   Culture  Setup Time 02/18/2013 09:27   Final   Culture     Final   Value:  BLOOD CULTURE RECEIVED NO GROWTH TO DATE CULTURE WILL BE HELD FOR 5 DAYS BEFORE ISSUING A FINAL NEGATIVE REPORT   Report Status PENDING   Incomplete  URINE CULTURE     Status: None   Collection Time    02/18/13  6:00 AM      Result Value Range Status   Specimen Description URINE, CLEAN CATCH   Final   Special Requests NONE   Final   Culture  Setup Time 02/18/2013 00:00   Final   Colony Count 6,000 COLONIES/ML   Final   Culture INSIGNIFICANT GROWTH   Final   Report Status 02/19/2013 FINAL   Final  SURGICAL PCR SCREEN     Status: None   Collection Time    02/18/13  8:41 PM      Result Value Range Status   MRSA, PCR NEGATIVE  NEGATIVE Final   Staphylococcus aureus NEGATIVE  NEGATIVE Final   Comment:            The Xpert SA Assay (FDA     approved for NASAL specimens     in patients over 33 years of age),     is one component of     a comprehensive surveillance     program.  Test performance has     been validated by [employer] for patients greater     than or equal to 33 year old.     It is not intended     to diagnose infection nor to     guide or monitor treatment.     Labs: Basic Metabolic Panel:  Recent Labs Lab 02/17/13 2301 02/19/13 1125 02/20/13 0525  NA 131* 138 140  K 4.2 3.9 4.2  CL 93* 101 105  CO2 21 24 25   GLUCOSE 246* 219* 268*  BUN 23 8 7   CREATININE 1.67* 0.50 0.48*  CALCIUM 9.3 9.1 8.6   Liver Function Tests:  Recent Labs Lab 02/17/13 2301  AST 18  ALT 21  ALKPHOS 77  BILITOT 0.5  PROT 7.5  ALBUMIN 3.4*   CBC:  Recent Labs Lab 02/17/13 2301 02/20/13 0525  WBC 8.6 5.8  NEUTROABS 6.7  --   HGB 13.9 11.3*  HCT 41.0 34.1*  MCV 91.3 92.9  PLT 206 215   CBG:  Recent Labs Lab 02/19/13 1733 02/19/13 1841 02/19/13 2114 02/20/13 0728 02/20/13 1141  GLUCAP 145* 148* 153* 231* 176*     Signed:  Harjot Rivera  [facility] 02/20/2013, 3:58 PM

## 2013-02-20 NOTE — Progress Notes (Signed)
Pt provided with dc instructions and education. Pt verbalized understanding. Pt provided with home equipment. IV removed with tip intact. Heart monitor cleaned and returned to front. VSS at this time. Pt leaving for home with family. Levonne Spiller, RN

## 2013-02-20 NOTE — Evaluation (Signed)
Physical Therapy Evaluation Patient Details Name: [name] MRN: [id number] DOB: Oct 30, 1961 Today's Date: 02/20/2013 Time: 2440-1027 PT Time Calculation (min): 26 min  PT Assessment / Plan / Recommendation Clinical Impression  Pt is a 52 yo female who underwent L great toe amputation and is WBAT. Pt instructed on how to use RW and is safe to d/c home with assist of mother, use of RW and BSC. Pt safe to d/c home when medically ready per MD.    PT Assessment  Patient needs continued PT services    Follow Up Recommendations  No PT follow up;Supervision - Intermittent    Does the patient have the potential to tolerate intense rehabilitation      Barriers to Discharge None      Equipment Recommendations  Rolling walker with 5" wheels (3n1 commode)    Recommendations for Other Services     Frequency Min 3X/week    Precautions / Restrictions Precautions Precautions: Fall Required Braces or Orthoses: Other Brace/Splint Other Brace/Splint: post op shoe on L LE Restrictions Weight Bearing Restrictions: Yes LLE Weight Bearing: Weight bearing as tolerated   Pertinent Vitals/Pain 6/10 L foot pain      Mobility  Bed Mobility Bed Mobility: Supine to Sit;Sit to Supine Supine to Sit: 6: Modified independent (Device/Increase time);With rails;HOB flat Sit to Supine: 6: Modified independent (Device/Increase time);HOB flat;With rail Details for Bed Mobility Assistance: increased time required Transfers Transfers: Sit to Stand;Stand to Sit Sit to Stand: 4: Min guard;With upper extremity assist;From bed Stand to Sit: 5: Supervision;With upper extremity assist;To bed Details for Transfer Assistance: increased time to achieve standing due to first time. v/c's for L LE management Ambulation/Gait Ambulation/Gait Assistance: 4: Min guard Ambulation Distance (Feet): 20 Feet Assistive device: Rolling walker Ambulation/Gait Assistance Details: max directional v/c's for walker sequencing  and to increaed bilat UE support to off-weight L LE Gait Pattern: Step-to pattern;Decreased step length - left;Decreased stance time - left;Decreased hip/knee flexion - left;Left genu recurvatum Gait velocity: slow Stairs: No (discussed verbally how to manage short platform steps, pt with verbal understanding of "up with good, down with the bad" concept) Wheelchair Mobility Wheelchair Mobility: No    Exercises     PT Diagnosis: Difficulty walking;Acute pain  PT Problem List: Decreased activity tolerance;Decreased balance;Decreased mobility PT Treatment Interventions: Gait training;Stair training;Functional mobility training;Therapeutic activities;Therapeutic exercise   PT Goals Acute Rehab PT Goals PT Goal Formulation: With patient Time For Goal Achievement: 02/27/13 Potential to Achieve Goals: Good Pt will go Supine/Side to Sit: Independently;with HOB 0 degrees PT Goal: Supine/Side to Sit - Progress: Goal set today Pt will go Sit to Supine/Side: Independently;with HOB 0 degrees PT Goal: Sit to Supine/Side - Progress: Goal set today Pt will go Sit to Stand: Independently PT Goal: Sit to Stand - Progress: Goal set today Pt will Ambulate: >150 feet;with modified independence;with rolling walker PT Goal: Ambulate - Progress: Goal set today Pt will Go Up / Down Stairs: 1-2 stairs;with supervision;with rolling walker (platform steps)  Visit Information  Last PT Received On: 02/20/13 Assistance Needed: +1    Subjective Data  Subjective: Pt received supine in bed with L post op shoe. Patient Stated Goal: home   Prior Functioning  Home Living Lives With: Family (mother) Available Help at Discharge: Family;Available 24 hours/day Type of Home: House Home Access: Stairs to enter [employer] of Steps: 2 (platform steps) Entrance Stairs-Rails: None Home Layout: One level Bathroom Shower/Tub: Engineer, manufacturing systems: Standard Home Adaptive Equipment: None Prior  Function Level of Independence: Independent Able to Take Stairs?: Yes Driving: Yes Vocation: Full time employment Communication Communication: No difficulties Dominant Hand: Right    Cognition  Cognition Overall Cognitive Status: Appears within functional limits for tasks assessed/performed Arousal/Alertness: Awake/alert Orientation Level: Oriented X4 / Intact Behavior During Session: [facility] for tasks performed    Extremity/Trunk Assessment Right Upper Extremity Assessment RUE ROM/Strength/Tone: Within functional levels Left Upper Extremity Assessment LUE ROM/Strength/Tone: Within functional levels Right Lower Extremity Assessment RLE ROM/Strength/Tone: Within functional levels Left Lower Extremity Assessment LLE ROM/Strength/Tone: Deficits LLE ROM/Strength/Tone Deficits: hip/knee WFL, ankle limited by pain from amp Trunk Assessment Trunk Assessment: Normal   Balance    End of Session PT - End of Session Equipment Utilized During Treatment: Gait belt (L post op shoe) Activity Tolerance: Patient tolerated treatment well Patient left: in bed;with call bell/phone within reach Nurse Communication: Mobility status  GP     Marcene Brawn 02/20/2013, 3:22 PM  Lewis Shock, PT, DPT Pager #: [phone] Office #: [phone]

## 2013-02-20 NOTE — Progress Notes (Signed)
Visit to patient while in hospital. Will call after she is discharged to assist with transition of care. Patient has orange card and can go to [facility] and request meter, strips and syringes. Will request Rx be called in to [facility] for same.

## 2013-02-20 NOTE — Telephone Encounter (Signed)
Patient seen in hospital. Has orange card and request diabetes supply Rx for [facility]

## 2013-02-20 NOTE — Op Note (Signed)
NAMEAMAIAH, CRISTIANO NO.:  [id number]  MEDICAL RECORD NO.:  [id number]  LOCATION:  3W17C                        FACILITY:  [facility]  PHYSICIAN:  Vanita Panda. Magnus Ivan, M.D.DATE OF BIRTH:  02/09/1961  DATE OF PROCEDURE:  02/19/2013 DATE OF DISCHARGE:                              OPERATIVE REPORT   PREOPERATIVE DIAGNOSIS:  Left great toe infection with abscess and osteomyelitis.  POSTOPERATIVE DIAGNOSIS:  Left great toe infection with abscess and osteomyelitis.  PROCEDURE:  Left great toe amputation through proximal phalanx.  SURGEON:  Vanita Panda. Magnus Ivan, M.D.  ASSISTING:  Richardean Canal, P.A.  ANESTHESIA: 1. Regional left ankle block. 2. IV sedation.  ESTIMATED BLOOD LOSS:  Minimal.  COMPLICATIONS:  None.  INDICATIONS:  Ms. [name] is a 52 year old poorly controlled diabetic, who has had a chronic wound involving her left great toe who developed purulent drainage.  She also developed fever and chills and was admitted to the hospital to the hospitalist service.  Orthopedic Surgery was then consulted.  It was recommended as she had evidence of osteomyelitis in the distal phalanx as well as abscess, there was gross purulence coming from her toe, and she understood that she needs to have amputation of the end of her toe.  She understands the risk of acute blood loss anemia, infection and the need for further amputation pending her ability to heal given her diabetes is under poor control.  She did wish to proceed with surgery.  PROCEDURE DESCRIPTION:  After informed consent was obtained, appropriate left foot was marked.  Anesthesia was obtained as a regional ankle block.  She was then brought to the operating room, placed upon the operating table.  Her left foot was prepped and draped with Betadine scrub and paint.  With IV sedation and adequate anesthesia, time-out was called to identify correct patient, correct left foot.  We then made incision  around the great toe, which she tolerated easily and dissected down to the level of the proximal phalanx using a bone cutting forceps. We cut across this to remove the head of the toe that was obviously infected.  The proximal phalanx bone actually looked like it was good bone stock, and there was adequate bleeding from the digital arteries, which we cauterized.  We then reshaped the bones some more and then irrigated the tissues thoroughly.  We got back to a nice normal tissue with no residual infection.  We then performed a soft tissue closure using interrupted 2-0 nylon suture. Xeroform and well-padded sterile dressing was applied, and she was taken to recovery in stable condition.  All final counts were correct.  There were no complications noted.  Of note, Richardean Canal, P.A. was present during the entire case.     Vanita Panda. Magnus Ivan, M.D.     CYB/MEDQ  D:  02/19/2013  T:  02/20/2013  Job:  [id number]

## 2013-02-20 NOTE — Progress Notes (Signed)
Subjective: 1 Day Post-Op Procedure(s) (LRB): Amputation of Left Great Toe (Left) Patient reports pain as mild.    Objective: Vital signs in last 24 hours: Temp:  [97 F (36.1 C)-98.5 F (36.9 C)] 97.9 F (36.6 C) (03/26 1610) Pulse Rate:  [67-87] 75 (03/26 0613) Resp:  [14-20] 20 (03/26 9604) BP: (87-123)/(46-94) 123/66 mmHg (03/26 0613) SpO2:  [99 %-100 %] 99 % (03/26 0613)  Intake/Output from previous day: 03/25 0701 - 03/26 0700 In: 400 [I.V.:400] Out: 1175 [Urine:1175] Intake/Output this shift:     Recent Labs  02/17/13 2301 02/20/13 0525  HGB 13.9 11.3*    Recent Labs  02/17/13 2301 02/20/13 0525  WBC 8.6 5.8  RBC 4.49 3.67*  HCT 41.0 34.1*  PLT 206 215    Recent Labs  02/19/13 1125 02/20/13 0525  NA 138 140  K 3.9 4.2  CL 101 105  CO2 24 25  BUN 8 7  CREATININE 0.50 0.48*  GLUCOSE 219* 268*  CALCIUM 9.1 8.6   No results found for this basename: LABPT, INR,  in the last 72 hours  Incision: dressing C/D/I  Assessment/Plan: 1 Day Post-Op Procedure(s) (LRB): Amputation of Left Great Toe (Left) Surgery went well and the left great toe was removed back to good viable bone and bleeding tissue.  From Ortho's standpoint, she can go home on oral antibiotics (consider 2 weeks of doxycycline). Will leave wound care instructions in the d/c instructions section.  BLACKMAN,CHRISTOPHER Y 02/20/2013, 7:10 AM

## 2013-02-21 ENCOUNTER — Encounter (HOSPITAL_COMMUNITY): Payer: Self-pay | Admitting: Orthopaedic Surgery

## 2013-02-21 ENCOUNTER — Telehealth: Payer: Self-pay | Admitting: Dietician

## 2013-02-21 MED ORDER — GLUCOSE BLOOD VI STRP: ORAL_STRIP | Status: DC

## 2013-02-21 MED ORDER — BLOOD GLUCOSE METER KIT: PACK | Status: DC

## 2013-02-21 MED ORDER — "INSULIN SYRINGE-NEEDLE U-100 31G X 5/16"" 0.5 ML MISC": 1.0000 | Freq: Two times a day (BID) | Status: DC

## 2013-02-21 NOTE — Telephone Encounter (Signed)
Discharge date:02-20-13 Call date: 02-21-13 Hospital follow up appointment date: 02-28-13 10 AM with Dr. Marlana SalvageThad Ranger and 10 :30 AM with CDE  Calling to assist with transition of care from hospital to home. Discharge medications reviewed:no- says her daughter got everything she needed from health department and she just took her antibiotic Able to fill all prescriptions? yes Patient aware of hospital follow up appointments. yes No problems with transportation.patient denies  Other problems/concerns:asked her to check blood sugar 4 times a day 2 days prior to visit(breakfast, lunch dinner and bedtime) next week and 3 times other days. Breakfast and dinner, alternate lunch and bedtime.

## 2013-02-21 NOTE — Telephone Encounter (Signed)
To become [facility] pt. New pt appt 4/3 Dr Damita Dunnings. Will refill.

## 2013-02-25 LAB — CULTURE, BLOOD (ROUTINE X 2)
Culture: NO GROWTH
Culture: NO GROWTH

## 2013-02-28 ENCOUNTER — Ambulatory Visit (INDEPENDENT_AMBULATORY_CARE_PROVIDER_SITE_OTHER): Payer: No Typology Code available for payment source | Admitting: Dietician

## 2013-02-28 ENCOUNTER — Ambulatory Visit (INDEPENDENT_AMBULATORY_CARE_PROVIDER_SITE_OTHER): Payer: No Typology Code available for payment source | Admitting: Radiation Oncology

## 2013-02-28 ENCOUNTER — Encounter: Payer: Self-pay | Admitting: Dietician

## 2013-02-28 DIAGNOSIS — E1149 Type 2 diabetes mellitus with other diabetic neurological complication: Secondary | ICD-10-CM

## 2013-02-28 DIAGNOSIS — E1140 Type 2 diabetes mellitus with diabetic neuropathy, unspecified: Secondary | ICD-10-CM

## 2013-02-28 DIAGNOSIS — Z23 Encounter for immunization: Secondary | ICD-10-CM

## 2013-02-28 DIAGNOSIS — I10 Essential (primary) hypertension: Secondary | ICD-10-CM

## 2013-02-28 DIAGNOSIS — E119 Type 2 diabetes mellitus without complications: Secondary | ICD-10-CM

## 2013-02-28 DIAGNOSIS — M869 Osteomyelitis, unspecified: Secondary | ICD-10-CM

## 2013-02-28 DIAGNOSIS — E1142 Type 2 diabetes mellitus with diabetic polyneuropathy: Secondary | ICD-10-CM

## 2013-02-28 LAB — MICROALBUMIN / CREATININE URINE RATIO: Microalb Creat Ratio: 53.7 mg/g — ABNORMAL HIGH (ref 0.0–30.0)

## 2013-02-28 NOTE — Assessment & Plan Note (Signed)
Lab Results  Component Value Date   HGBA1C 11.5* 02/20/2013   HGBA1C 7.6* 08/08/2012   HGBA1C  Value: 12.5 (NOTE)                                                                       According to the ADA Clinical Practice Recommendations for 2011, when HbA1c is used as a screening test:   >=6.5%   Diagnostic of Diabetes Mellitus           (if abnormal result  is confirmed)  5.7-6.4%   Increased risk of developing Diabetes Mellitus  References:Diagnosis and Classification of Diabetes Mellitus,Diabetes Care,2011,34(Suppl 1):S62-S69 and Standards of Medical Care in         Diabetes - 2011,Diabetes WGNF,6213,08  (Suppl 1):S11-S61.* 10/02/2010     Assessment:  Diabetes control: poor control (HgbA1C >9%)  Progress toward A1C goal:  unable to assess  Comments: patient has had very poor glycemic control recently, which is secondary to her running out of her metformin and insulin for several months. Her A1c = 11.5 in 01/2013, however when she was taking her insulin and metformin as prescribed her A1c = 7.6 in 07/2012. She states she's been fully compliant with both medications since been discharged following her left great toe amputation approximately one week ago.  She has been checking her CBGs since her recent discharge, with values typically in the 200s with no episodes of hypoglycemia per her report.   Plan:  Medications:  continue current medications  Home glucose monitoring:   Frequency:     Timing:    Instruction/counseling given: discussed foot care  Educational resources provided:    Self management tools provided:    Other plans: will continue her current 70/30 and metformin doses/frequencies as she had relatively good control on this regimen previously.  - cont 70/30 bid - cont metformin 1000mg  bid - retinal scan - check microalbumin/cr ratio - refer to wound care - refer to podiatry - recheck A1c in 3 months (or less as pt is resuming therapy)

## 2013-02-28 NOTE — Assessment & Plan Note (Signed)
S/p amputation of the left great toe. The patient is recovering well, and is exercising good wound care techniques at home. No evidence of infection in the area of the amputation. She will followup with Dr. Magnus Ivan in approximately one week. Regarding the area of skin breakdown of her left anterior ankle, this issue will need to be assessed by wound care. No clear evidence of infection in this area either, and the patient states she is fully compliant with doxycycline therapy. - cont doxycycline - f/u with Dr. Magnus Ivan

## 2013-02-28 NOTE — Assessment & Plan Note (Signed)
BP Readings from Last 3 Encounters:  02/20/13 128/63  02/20/13 128/63  01/14/13 136/86    Lab Results  Component Value Date   NA 140 02/20/2013   K 4.2 02/20/2013   CREATININE 0.48* 02/20/2013    Assessment:  Blood pressure control: controlled  Progress toward BP goal:  at goal  Comments: the patient states she's never had significant hypertension, and complains of lightheadedness which is worst on standing. Given this and also her recent history of recent AKI, will discontinue her HCTZ today.   Plan:  Medications:  Cont lisinopril. D/c HCTZ  Educational resources provided:    Self management tools provided:    Other plans: HCTZ discontinued, but can be resumed in the future if necessary for BP control.

## 2013-02-28 NOTE — Assessment & Plan Note (Signed)
Patient has a history of diabetic peripheral neuropathy with associated diabetic foot calluses, ulcers, and now osteomyelitis (s/p L great toe amputation). She was counseled in the importance of strict glycemic control and preventing further sequelae of diabetes mellitus in regard to this issue, and will be resuming care with wound care as soon as possible (has previously been established with wound care in the past). She would also likely benefit from a podiatry visit for recommendations on footwear given this issue and the fact that the patient no longer has a left great toe. Patient was seen by physical therapy during her hospitalization, who did not recommend any home health physical therapy. - refer to wound care - refer to podiatry

## 2013-02-28 NOTE — Patient Instructions (Signed)
-   Please stop taking your HCTZ. - Please continue taking all of your other medications as prescribed.  - Make an appointment to see Wound Care as soon as possible. - Contact us if you have any new or worsening symptoms.   We are glad to have you as a new patient in our clinic. It was very nice to meet you. Have a great day.

## 2013-02-28 NOTE — Progress Notes (Signed)
  Subjective:    [name], female    DOB: 01/18/1961, 52 y.o.   MRN: [id number]  HPI Patient is a 52 year old woman with PMH significant for poorly controlled type 2 diabetes mellitus and hypertension who presents to clinic today to establish care with a PCP after her recent hospitalization for osteomyelitis. The patient was admitted with osteomyelitis on 02/16/2013, and subsequently underwent amputation of her left great toe for this issue. She states that since she left the hospital she has been having lightheadedness on standing and sometimes with walking, and also complains of decreased appetite. She denies any fevers or chills. She denies any severe pain in her left foot. She takes the Norco she was prescribed infrequently as it makes her nervous to take narcotic-based pain medications. She complains of swelling of her anterior left ankle, associated with skin breakdown and discharge of bloody/purulent fluid, which she believes is secondary to having received local anesthetic in that area prior to her left great toe amputation. She states she's been taking her doxycycline as prescribed.  The patient has a history of previous calluses and also ulcers pf the bilateral feet, per her report. She states she's previously been followed by wound care for these issues, but has not been seen by them for some time. She admits to some sensory loss in her bilateral feet.  The patient states the reason her diabetes was poorly controlled recently was because after the closing of her previous PC[facility] office [facility], she ran out of her insulin and metformin prescriptions in 11/2012, and had not had these medications refilled until her hospitalization several days ago for osteomyelitis. She associates her development of osteomyelitis with her worsening glycemic control.    Despite her recent left great toe amputation, the patient states she's been able to ambulate well at home with a walker, and  ambulated to clinic today without a walker and without assistance.   Review of Systems  All other systems reviewed and are negative.       Objective:   Physical Exam  Constitutional: She is oriented to person, place, and time. She appears well-developed and well-nourished. No distress.  HENT:  Head: Normocephalic and atraumatic.  Eyes: Conjunctivae are normal. Pupils are equal, round, and reactive to light. No scleral icterus.  Neck: Normal range of motion. Neck supple. No tracheal deviation present.  Cardiovascular: Normal rate and regular rhythm.   No murmur heard. Pulmonary/Chest: Effort normal. She has no wheezes. She has no rales.  Abdominal: Soft. Bowel sounds are normal. She exhibits no distension. There is no tenderness.  Musculoskeletal:  Large, 2x3cm flaccid/ruptured bulla of anterior L ankle with small amounts of serosanguinous discharge. L great toe surgically absent, with no erythema, edema, drainage or TTP in surrounding area.   Plantar calluses in area of 2nd tarsometatarsal joints of bilateral feet. Decreased sensation in bilateral feet. 2+ DP pulses bilaterally.   Neurological: She is oriented to person, place, and time. No cranial nerve deficit.          Assessment & Plan:

## 2013-02-28 NOTE — Progress Notes (Signed)
Diabetes Self-Management Education  Visit Number: First/Initial  02/28/2013 [name], identified by name and date of birth, is a 52 y.o. female with Diabetes Type: Type 2.  Other people present during visit:    no  ASSESSMENT  Patient Concerns:  Glycemic Control;Support  Lab Results: LDL Cholesterol  Date Value Range Status     Hemoglobin A1C  Date Value Range Status  02/20/2013 11.5* <5.7 % Final     (NOTE)                                                                               Family History  Problem Relation Age of Onset  . Diabetes Father   . Diabetes Daughter    History  Substance Use Topics  . Smoking status: Never Smoker   . Smokeless tobacco: Not on file  . Alcohol Use: No    Support Systems:  Parents;Other (comment) Special Needs:  None  Prior DM Education:  yes- has had 30 years since gestational diabetes with her son Daily Foot Exams: Yes Patient Belief / Attitude about Diabetes:  Diabetes can be controlled  Assessment comments: supportive listening to patient frustration about loss of toe, diabetes self management. Patient motivated and adept at diabetes self management. Lantus 30 units daily with 70/30 twice daily insulin regimen worked well for her in the past and she would like to retry this. We discussed pros & cons of basal bolus and split mixed insulin therapy today.    Diet Recall: lack of consistency with timing, but tries to eat well balanced and nutritious meals.     Individualized Plan for Diabetes Self-Management Training:  Patient individualized diabetes plan discussed today with patient and includes:  Nutrition, medications, monitoring, how to handle highs and lows.   Education Topics Reviewed with Patient Today:  Topic Points Discussed  Disease State    Nutrition Management Role of diet in the treatment of diabetes and the relationship between the three main macronutrients and blood glucose level;Food label reading,  portion sizes and measuring food.;Carbohydrate counting;Meal timing in regards to the patients' current diabetes medication.  Physical Activity and Exercise    Medications Reviewed patients medication for diabetes, action, purpose, timing of dose and side effects.  Monitoring Purpose and frequency of SMBG.  Acute Complications    Chronic Complications    Psychosocial Adjustment Identified and addressed patients feelings and concerns about diabetes;Brainstormed with patient on coping mechanisms for social situations, getting support from significant others, dealing with feelings about diabetes  Goal Setting    Preconception Care (if applicable)      PATIENTS GOALS   Learning Objective(s):     Goal The patient agrees to:  Nutrition    Physical Activity    Medications    Monitoring test blood glucose pre and post meals as discussed  Problem Solving    Reducing Risk    Health Coping     Patient Self-Evaluation of Goals (Subsequent Visits)  Goal The patient rates self as meeting goals (% of time)  Nutrition    Physical Activity    Medications    Monitoring    Problem Solving    Reducing Risk    Health  Coping       PERSONALIZED PLAN / SUPPORT  Self-Management Support:  Doctor's office;Friends;Family;CDE visits ______________________________________________________________________  Outcomes  Expected Outcomes:  Demonstrated interest in learning. Expect positive outcomes Self-Care Barriers:  Lack of transportation;Lack of material resources Education material provided: yes- on carb counting If problems or questions, patient to contact team via:  Phone Time in: 1130     Time out: 1230 Future DSME appointment: - 4-6 wks   Plyler, [name] 02/28/2013 3:10 PM

## 2013-03-01 ENCOUNTER — Encounter (HOSPITAL_BASED_OUTPATIENT_CLINIC_OR_DEPARTMENT_OTHER): Payer: No Typology Code available for payment source | Attending: General Surgery

## 2013-03-06 ENCOUNTER — Telehealth: Payer: Self-pay | Admitting: *Deleted

## 2013-03-06 NOTE — Telephone Encounter (Signed)
Since appt last week w/ dr Lavena Bullion (4/3) pt has appr 4 episodes of dizziness, weakness and her balance being "off", almost falling. She requests f/u appt asap, very worried. appt 4/10 dr Saralyn Pilar at [phone]

## 2013-03-06 NOTE — Telephone Encounter (Signed)
i called pt after speaking w/ dr Meredith Pel and explained that these episodes could be something very serious considering pt's history and that the ED would be the best answer today for the pt just to be safe, she states "no, im not trying to get back in the hospital, i will be there tomorrow, just like i told you before i want to come tomorrow now if they tell me me tomorrow they are putting me back i'll go and not say no but im not going to no emergency room today" i have kept the appt in place and encouraged that if she has another episode of dizziness, weakness or balance problems to call 911 or go straight to ED by someone else driving, she is agreeable

## 2013-03-06 NOTE — Telephone Encounter (Signed)
Given those symptoms, I would advise that she be seen in the ED today.

## 2013-03-07 ENCOUNTER — Encounter: Payer: Self-pay | Admitting: Internal Medicine

## 2013-03-07 ENCOUNTER — Ambulatory Visit (INDEPENDENT_AMBULATORY_CARE_PROVIDER_SITE_OTHER): Payer: No Typology Code available for payment source | Admitting: Internal Medicine

## 2013-03-07 VITALS — BP 126/82 | HR 89 | Temp 97.8°F | Ht 73.0 in | Wt 278.6 lb

## 2013-03-07 DIAGNOSIS — R42 Dizziness and giddiness: Secondary | ICD-10-CM

## 2013-03-07 DIAGNOSIS — H8110 Benign paroxysmal vertigo, unspecified ear: Secondary | ICD-10-CM | POA: Insufficient documentation

## 2013-03-07 HISTORY — DX: Benign paroxysmal vertigo, unspecified ear: H81.10

## 2013-03-07 MED ORDER — ALPRAZOLAM 0.5 MG PO TABS: 0.5000 mg | ORAL_TABLET | Freq: Once | ORAL | Status: DC

## 2013-03-07 MED ORDER — ASPIRIN 81 MG PO TABS: 81.0000 mg | ORAL_TABLET | Freq: Every day | ORAL | Status: DC

## 2013-03-07 NOTE — Progress Notes (Addendum)
Patient: [name]   MRN: [id number]  DOB: 29-Jun-1961  PCP: Priscella Mann, DO   Subjective:    HPI: [name] is a 52 y.o. female with a PMHx as outlined below, who presented to clinic today for the following:  1) Vertigo - Patient indicates that since March 2014, she is having episodes of vertigo with room spinning sensation occuring once daily and lasting for 15-20 minutes at a time. She does not experience prodromal symptoms prior to onset of the vertigo. Then, during the episodes, she occasionally had lightheadedness and gait instability without lateral deviation to either side. During the episodes, she specifically does not experience nausea, vomiting, vision changes, chest pain, palpitations, facial droop, slurred speech. When the symptoms come on, the patient will a down in her bed, with complete resolution of symptoms within 15-20 minutes. After resolution of symptoms, she does not have residual deficits and returns to her baseline. Episodes can occur both while she is walking, and if she is at rest. She does have worsening of symptoms occasionally when she exquisite head movements  Otherwise, the patient denies recent syncopal event, fevers, chills, URI symptoms. States that she is followed up with her orthopedist, Dr. Magnus Ivan within the past few days and he states that her left great toe which is now status post amputation is healing well without spreading of the infected area.   She has not had any focal weakness, paresthesias, slurred speech, facial droop.  Review of Systems: Constitutional:  admits to fatigue Denies fever, chills, diaphoresis, appetite change.  HEENT: denies eye pain, redness, hearing loss, ear pain, congestion, sore throat, rhinorrhea.  Respiratory: denies SOB, DOE, cough, chest tightness, and wheezing.  Cardiovascular: denies chest pain, palpitations and leg swelling.  Gastrointestinal: denies nausea, vomiting, abdominal pain, diarrhea,  constipation, blood in stool.  Genitourinary: denies dysuria, urgency, frequency, hematuria, flank pain and difficulty urinating.  Musculoskeletal: denies myalgias, back pain, joint swelling, arthralgias and gait problem.   Skin: denies pallor, rash and wound.  Neurological: admits to dizziness, lightheadedness,  Denies seizures, syncope, weakness, numbness and headaches.       Current Outpatient Medications: Medication Sig  . doxycycline (VIBRA-TABS) 100 MG tablet Take 1 tablet (100 mg total) by mouth 2 (two) times daily.  . insulin NPH-insulin regular (NOVOLIN 70/30) (70-30) 100 UNIT/ML injection Inject into the skin 2 (two) times daily with a meal. Take 45 units Q AM and 35 units Q PM  . lisinopril (PRINIVIL,ZESTRIL) 10 MG tablet Take 1 tablet (10 mg total) by mouth daily.  . metFORMIN (GLUCOPHAGE) 1000 MG tablet Take 1,000 mg by mouth 2 (two) times daily with a meal.  . HYDROcodone-acetaminophen (NORCO/VICODIN) 5-325 MG per tablet Take 1-2 tablets by mouth every 4 (four) hours as needed.     Allergies: Allergies  Allergen Reactions  . Aspirin Nausea And Vomiting  . Ibuprofen Nausea And Vomiting and Other (See Comments)    abd pain  . Penicillins Other (See Comments)    unknown    Past Medical History  Diagnosis Date  . Diabetes mellitus   . Hypertension   . Asthma 11/01/2010    Objective:    Vitals: BP 126/82  Pulse 89  Temp(Src) 97.8 F (36.6 C)  Ht 6\' 1"  (1.854 m)  Wt 278 lb 9.6 oz (126.372 kg)  BMI 36.76 kg/m2  SpO2 99%   Orthostatic Vital Signs:  Blood Pressure Heart Rate  Laying 127/74 mmHg 81 bpm  Sitting  11914 mmHg 84 bpm  Standing 118/78 mmHg 84 bpm    General Exam:   General: Vital signs reviewed and noted. Well-developed, well-nourished, in no acute distress; alert, appropriate and cooperative throughout examination.  Head: Normocephalic, atraumatic.  Eyes: No signs of anemia or jaundince.  Nose: Mucous membranes moist, not inflammed,  nonerythematous.  Throat: Oropharynx nonerythematous, no exudate appreciated.   Neck: No deformities, masses, or tenderness noted. Supple, No carotid Bruits, no JVD.  Lungs:  Normal respiratory effort. Clear to auscultation BL without crackles or wheezes.  Heart: RRR. S1 and S2 normal without gallop, murmur, or rubs.  Abdomen:  BS normoactive. Soft, Nondistended, non-tender.  No masses or organomegaly.  Extremities: No pretibial edema. Left foot in flat bottom shoe.    Neurologic Exam:   Mental Status: Alert, oriented, thought content appropriate.  Speech fluent without evidence of aphasia. Able to follow 3 step commands without difficulty.  Cranial Nerves:   II: Visual fields grossly intact.  III/IV/VI: Extraocular movements intact.  Pupils reactive bilaterally.  V/VII: Smile symmetric. facial light touch sensation normal bilaterally.  VIII: Grossly intact.  IX/X: Normal gag.  XI: Bilateral shoulder shrug normal.  XII: Midline tongue extension normal.  Motor:  5/5 bilaterally with normal tone and bulk  Sensory:  Light touch intact throughout, bilaterally  DTRs: 2+ and symmetric throughout  Cerebellar: Normal finger-to-nose, normal rapid alternating movements and normal heel-to-shin test.  Normal gait and station.    Assessment/ Plan:   The patient's case and plan of care was discussed with attending physician, Dr. Margarito Liner.

## 2013-03-07 NOTE — Patient Instructions (Signed)
General Instructions:  Please follow-up at the clinic in 1 week , at which time we will reevaluate your diabetes, blood pressure - OR, please follow-up in the clinic sooner if needed.  There have not been changes in your medications.   Get the MRI / MRA of your brain as soon as possible - I will call you if there is something abnormal  See the ENT doctor as soon as you can - to help with your vertigo   If you are having worsening of your symptoms such as new weakness, tingling, facial droop, room spinning lasting for longer, vision changes - GO TO ER IMMEDIATELY.   If you have been started on new medication(s), and you develop symptoms concerning for allergic reaction, including, but not limited to, throat closing, tongue swelling, rash, please stop the medication immediately and call the clinic at [phone], and go to the ER.  If you are diabetic, please bring your meter to your next visit.  If symptoms worsen, or new symptoms arise, please call the clinic or go to the ER.  PLEASE BRING ALL OF YOUR MEDICATIONS  IN A BAG TO YOUR NEXT APPOINTMENT   Treatment Goals:  Goals (1 Years of Data) as of 03/07/13         02/20/13 02/20/13 02/19/13 02/19/13 02/19/13     Blood Pressure    . Blood Pressure < 140/90  128/63 123/66 112/60 87/48 90/46      Result Component    . HEMOGLOBIN A1C < 7.0  11.5        . LDL CALC < 100            Progress Toward Treatment Goals:  Treatment Goal 02/28/2013  Hemoglobin A1C unable to assess  Blood pressure at goal     Vertigo Vertigo means you feel like you or your surroundings are moving when they are not. Vertigo can be dangerous if it occurs when you are at work, driving, or performing difficult activities.  CAUSES  Vertigo occurs when there is a conflict of signals sent to your brain from the visual and sensory systems in your body. There are many different causes of vertigo, including:  Infections, especially in the inner ear.  A bad reaction to  a drug or misuse of alcohol and medicines.  Withdrawal from drugs or alcohol.  Rapidly changing positions, such as lying down or rolling over in bed.  A migraine headache.  Decreased blood flow to the brain.  Increased pressure in the brain from a head injury, infection, tumor, or bleeding. SYMPTOMS  You may feel as though the world is spinning around or you are falling to the ground. Because your balance is upset, vertigo can cause nausea and vomiting. You may have involuntary eye movements (nystagmus). DIAGNOSIS  Vertigo is usually diagnosed by physical exam. If the cause of your vertigo is unknown, your caregiver may perform imaging tests, such as an MRI scan (magnetic resonance imaging). TREATMENT  Most cases of vertigo resolve on their own, without treatment. Depending on the cause, your caregiver may prescribe certain medicines. If your vertigo is related to body position issues, your caregiver may recommend movements or procedures to correct the problem. In rare cases, if your vertigo is caused by certain inner ear problems, you may need surgery. HOME CARE INSTRUCTIONS   Follow your caregiver's instructions.  Avoid driving.  Avoid operating heavy machinery.  Avoid performing any tasks that would be dangerous to you or others during a vertigo episode.  Tell your caregiver if you notice that certain medicines seem to be causing your vertigo. Some of the medicines used to treat vertigo episodes can actually make them worse in some people. SEEK IMMEDIATE MEDICAL CARE IF:   Your medicines do not relieve your vertigo or are making it worse.  You develop problems with talking, walking, weakness, or using your arms, hands, or legs.  You develop severe headaches.  Your nausea or vomiting continues or gets worse.  You develop visual changes.  A family member notices behavioral changes.  Your condition gets worse. MAKE SURE YOU:  Understand these instructions.  Will watch  your condition.  Will get help right away if you are not doing well or get worse. Document Released: 08/24/2005 Document Revised: 02/06/2012 Document Reviewed: 06/02/2011 [facility] Patient Information 2013 [place], [place].

## 2013-03-07 NOTE — Progress Notes (Signed)
I discussed the patient with resident Dr. Kalia-Reynolds at the time of the visit.  I reviewed the history, findings, diagnosis, and treatment plans as outlined in the resident's note, and I agree with the plans as outlined in her note.  

## 2013-03-07 NOTE — Assessment & Plan Note (Addendum)
Assessment: The patient presents with daily episodes of vertigo lasting 15-20 minutes at a time. This is concerning for true vertigo, possibly secondary to posterior circulation deficits. However, I am not able to recreate any of the symptoms on a very thorough neurologic exam. Symptoms have been present for greater than a month, and are not getting worse such as coming more prolonged or frequent. Therefore, I do think that we can evaluate this as an outpatient. However the patient was extensively advised regarding going to the ER immediately if she has worsening of her symptoms such as prolongation of the vertigo episodes, new focal neurologic deficits, vision changes, amongst other red flag symptoms.  There are multiple additional differential diagnoses no clear cause. The patient has no preceding URI and ear symptoms at to suggest labyrinthitis. She is having daily episodes, which is not as consistent with BPPV, rotary recreation of symptoms is consistent. Autonomic insufficiency as a result of her uncontrolled DM is also considered (especially given other complications of peripheral neuropathy).  Of note, the patient has been checking CBG during episode, which are typically in the 100s. Without hypoglycemic or severe hyperglycemic episodes.  Plan:      MRI/MRA head/neck without contrast  ENT referral for evaluation of additional causes of vertigo such as BPPV. The patient was informed of the red flag symptoms that should prompt her immediate return for reevaluation - such as, but not limited to the following: vertigo episodes, new focal neurologic deficits, vision changes. She verbally expresses understanding of the information provided.  Start aspirin 81mg  daily. One tablet of Xanax prescribed for prior to the MRI/MRA - she is claustrophobic therefore this was prescribed.

## 2013-03-12 NOTE — Addendum Note (Signed)
Addended by: Priscella Mann on: 03/12/2013 02:20 PM   Modules accepted: Orders

## 2013-03-13 ENCOUNTER — Ambulatory Visit (HOSPITAL_COMMUNITY)
Admission: RE | Admit: 2013-03-13 | Discharge: 2013-03-13 | Disposition: A | Discharge status: Home / Self Care | Payer: No Typology Code available for payment source | Source: Ambulatory Visit | Attending: Internal Medicine | Admitting: Internal Medicine

## 2013-03-13 DIAGNOSIS — R42 Dizziness and giddiness: Secondary | ICD-10-CM

## 2013-03-14 ENCOUNTER — Ambulatory Visit (INDEPENDENT_AMBULATORY_CARE_PROVIDER_SITE_OTHER): Payer: No Typology Code available for payment source | Admitting: Internal Medicine

## 2013-03-14 ENCOUNTER — Encounter: Payer: Self-pay | Admitting: Internal Medicine

## 2013-03-14 VITALS — BP 148/86 | HR 80 | Temp 97.6°F | Ht 73.0 in | Wt 281.6 lb

## 2013-03-14 DIAGNOSIS — M869 Osteomyelitis, unspecified: Secondary | ICD-10-CM

## 2013-03-14 DIAGNOSIS — R42 Dizziness and giddiness: Secondary | ICD-10-CM

## 2013-03-14 DIAGNOSIS — K219 Gastro-esophageal reflux disease without esophagitis: Secondary | ICD-10-CM

## 2013-03-14 DIAGNOSIS — E119 Type 2 diabetes mellitus without complications: Secondary | ICD-10-CM

## 2013-03-14 LAB — GLUCOSE, CAPILLARY: Glucose-Capillary: 166 mg/dL — ABNORMAL HIGH (ref 70–99)

## 2013-03-14 MED ORDER — OMEPRAZOLE 20 MG PO CPDR: 20.0000 mg | DELAYED_RELEASE_CAPSULE | Freq: Every day | ORAL | Status: DC

## 2013-03-14 NOTE — Patient Instructions (Signed)
General Instructions:  Please follow-up at the clinic in 2-3 weeks, at which time we will reevaluate your vertigo, DM - OR, please follow-up in the clinic sooner if needed.  There have been changes in your medications:  Start prilosec for your stomach and you can maalox if having heart burn.  Start aspirin 81mg  daily.   Please get your MRI and MRA as soon as possible.  If you have been started on new medication(s), and you develop symptoms concerning for allergic reaction, including, but not limited to, throat closing, tongue swelling, rash, please stop the medication immediately and call the clinic at [phone], and go to the ER.  If you are diabetic, please bring your meter to your next visit.  If symptoms worsen, or new symptoms arise, please call the clinic or go to the ER.  PLEASE BRING ALL OF YOUR MEDICATIONS  IN A BAG TO YOUR NEXT APPOINTMENT   Treatment Goals:  Goals (1 Years of Data) as of 03/14/13         As of Today 03/07/13 02/20/13 02/20/13 02/19/13     Blood Pressure    . Blood Pressure < 140/90  148/86 126/82 128/63 123/66 112/60     Result Component    . HEMOGLOBIN A1C < 7.0    11.5      . LDL CALC < 100            Progress Toward Treatment Goals:  Treatment Goal 02/28/2013  Hemoglobin A1C unable to assess  Blood pressure at goal    Self Care Goals & Plans:  Self Care Goal 03/14/2013  Manage my medications refill my medications on time; take my medicines as prescribed  Monitor my health keep track of my blood glucose; bring my glucose meter and log to each visit  Eat healthy foods eat more vegetables; drink diet soda or water instead of juice or soda  Be physically active (No Data)

## 2013-03-14 NOTE — Progress Notes (Signed)
Patient: [name]   MRN: [id number]  DOB: May 15, 1961  PCP: Priscella Mann, DO   Subjective:    HPI: [name] is a 52 y.o. female with a PMHx as outlined below, who presented to clinic today for the following:  1) Vertigo - during our last visit together approximately one week ago, the patient indicated that since March 2014, she is having episodes of vertigo with room spinning sensation occuring once daily and lasting for 15-20 minutes at a time - without prodromal symptoms. She also was experiencing occasional lightheadedness and gait instability without lateral deviation to one side versus another. There was significant concern for possible posterior circulation stroke in the setting of her poorly controlled diabetes. Therefore, the patient was arranged outpatient MRI/MRA. However, the patient indicates that she was unable to tolerate the procedure secondary to significant claustrophobia that was refractory to the Xanax 0.5 mg that was prescribed to her. Therefore, she still has not had this procedure performed. Additionally, the patient was recommended to start aspirin 81 mg daily. However, the patient indicates that after starting this medication she has stomach upset, therefore she discontinued taking it.  The patient continues to have these episodes of vertigo. Now occurring approximately one to 3 times daily with symptoms lasting for less than prior. During the episodes, she continues to remain without nausea, vomiting, vision changes, chest pain, palpitations, facial droop, slurred speech. Symptoms will resolve after laying down. She indicates that the symptoms can start when she is particularly walking quickly, arguing with someone, but can also occur when she is just laying down in her bed. After resolution of symptoms, she does not have residual deficits and returns to her baseline.   Review of Systems: Per HPI.   Current Outpatient Medications: Medication Sig    . ALPRAZolam (XANAX) 0.5 MG tablet Take 1 tablet (0.5 mg total) by mouth once.  Marland Kitchen aspirin 81 MG tablet Take 1 tablet (81 mg total) by mouth daily.  . Blood Glucose Monitoring Suppl (BLOOD GLUCOSE METER) kit Use as instructed  . doxycycline (VIBRA-TABS) 100 MG tablet Take 1 tablet (100 mg total) by mouth 2 (two) times daily.  Marland Kitchen glucose blood test strip Use to check blood sugar up to 3 times a day before meals Dx code- 250.00  . HYDROcodone-acetaminophen (NORCO/VICODIN) 5-325 MG per tablet Take 1-2 tablets by mouth every 4 (four) hours as needed.  . insulin NPH-insulin regular (NOVOLIN 70/30) (70-30) 100 UNIT/ML injection Inject into the skin 2 (two) times daily with a meal. Take 45 units Q AM and 35 units Q PM  . Insulin Syringe-Needle U-100 31G X 5/16" 0.5 ML MISC 1 each by Does not apply route 2 (two) times daily.  Marland Kitchen lisinopril (PRINIVIL,ZESTRIL) 10 MG tablet Take 1 tablet (10 mg total) by mouth daily.  . metFORMIN (GLUCOPHAGE) 1000 MG tablet Take 1,000 mg by mouth 2 (two) times daily with a meal.    Allergies  Allergen Reactions  . Aspirin Nausea And Vomiting  . Ibuprofen Nausea And Vomiting and Other (See Comments)    abd pain  . Penicillins Other (See Comments)    unknown    Past Medical History  Diagnosis Date  . Diabetes mellitus   . Hypertension   . Asthma 11/01/2010     Objective:    Physical Exam: Filed Vitals:   03/14/13 1448  BP: 148/86  Pulse: 80  Temp: 97.6 F (36.4 C)     General Exam:   General: Vital  signs reviewed and noted. Well-developed, well-nourished, in no acute distress; alert, appropriate and cooperative throughout examination.  Head: Normocephalic, atraumatic. Negative Dix-Hallpike test - although she experiences some dizziness sensation, this does not reproduce the symptoms that she experiences throughout the day. She does not have notable nystagmus with this procedure.   Eyes: No signs of anemia or jaundince.  Nose: Mucous membranes moist,  not inflammed, nonerythematous.  Throat: Oropharynx nonerythematous, no exudate appreciated.   Neck: No deformities, masses, or tenderness noted. Supple, no JVD.  Lungs:  Normal respiratory effort. Clear to auscultation BL without crackles or wheezes.  Heart: RRR. S1 and S2 normal without gallop, murmur, or rubs.  Abdomen:  BS normoactive. Soft, Nondistended, non-tender.  No masses or organomegaly.  Extremities: No pretibial edema. Left foot in flat bottom shoe.    Neurologic Exam:   Mental Status: Alert, oriented, thought content appropriate.  Speech fluent without evidence of aphasia. Able to follow 3 step commands without difficulty.  Cranial Nerves:   II: Visual fields grossly intact.  III/IV/VI: Extraocular movements intact.  Pupils reactive bilaterally.  V/VII: Smile symmetric. facial light touch sensation normal bilaterally.  VIII: Grossly intact.  IX/X: Normal gag.  XI: Bilateral shoulder shrug normal.  XII: Midline tongue extension normal.  Motor:  5/5 bilaterally with normal tone and bulk  Sensory:  Light touch intact throughout, bilaterally  Cerebellar: Normal finger-to-nose.   Normal gait and station.   Assessment/ Plan:   The patient's case and plan of care was discussed with attending physician, Dr. Doneen Poisson.

## 2013-03-14 NOTE — Assessment & Plan Note (Addendum)
Assessment: The patient continues to have multiple daily episodes of vertigo that can last between 5-20 minutes at a time. She was unable to tolerate the MRI MRA scheduled to evaluate for possible posterior circulation compromise. Unfortunately, she also has been unable to tolerate the enteric coated baby aspirin that was started during her last visit.   I am still concerned about central cause for her vertigo. Dix-Hallpike is otherwise negative, and I am less convinced that this is simply consistent with BPPV. The patient was extensively advised regarding going to the ER immediately if she has worsening of her symptoms such as prolongation of the vertigo episodes, new focal neurologic deficits, vision changes, amongst other red flag symptoms.  There are multiple additional differential diagnoses no clear cause. The patient has no preceding URI and ear symptoms at to suggest labyrinthitis. She is having daily episodes, which is not as consistent with BPPV, rotary recreation of symptoms is consistent. Autonomic insufficiency as a result of her uncontrolled DM is also considered (especially given other complications of peripheral neuropathy).  Of note, the patient has been checking CBG during episodes without hypoglycemic or severe hyperglycemic episodes.  Plan:      MRI/MRA head/neck without contrast - conscious sedation protocol per radiology team  For now, will ENT referral as less concerning for BPPV, may reconsider if MRI/MRA negative. The patient was informed of the red flag symptoms that should prompt her immediate return for reevaluation - such as, but not limited to the following: vertigo episodes, new focal neurologic deficits, vision changes. She verbally expresses understanding of the information provided.  Start aspirin 81mg  daily. Start prilosec day, and can use PRN maalox to help with stomach upset with aspirin.   ADDENDUM TO PLAN AFTER STUDIES RESULTED: 03/28/2013, 6:24 PM  Pertinent  Data Reviewed:  MRA head (03/28/2013) - IMPRESSION: Negative. Original Report Authenticated By: Janeece Riggers, M.D.  MRI brain (03/28/2013) - IMPRESSION: Normal MRI of the brain Mild mucosal edema paranasal sinuses.  Original Report Authenticated By: Janeece Riggers, M.D.  Assessment: Reassuringly, MRI / MRA do not indicate posterior circulation defect or stroke. Therefore, cause remains unclear, may be related to BPPV (despite negative Dix-Hallpike) versus autonomic instability in setting of poorly controlled DM. As MRI/MRA unrevealing, I do think it would be appropriate to continue with ENT referral for evaluation of possible BPPV or inner ear contributing factor. The pt was called and informed of results. As MRI negative, will dc aspirin and prilosec.   Plan:  DC aspirin and prilosec  ENT referral  Consider addition of meclizine next visit, if s/s concerning for BPPV.

## 2013-03-19 NOTE — Progress Notes (Signed)
Case discussed with Dr. Saralyn Pilar at the time of the visit. We reviewed the resident's history and exam and pertinent patient test results. I agree with the assessment, diagnosis and plan of care documented in the resident's note.  I agree with the concerns about a possible posterior circulation stroke given her hypertension, diabetes, and negative history and physical to support other diagnoses.  Will make another attempt at getting the MRI/MRA and restart ASA in the setting of PPI therapy in hopes of improving tolerance.

## 2013-03-20 ENCOUNTER — Telehealth (HOSPITAL_COMMUNITY): Payer: Self-pay | Admitting: *Deleted

## 2013-03-20 NOTE — Telephone Encounter (Signed)
MRI Screening  Pt Weight 281 lbs (If over 300 lbs, notify MRI technologist)  Claustrophobia Yes  Ever worked around metal, filing, grinding, or welding? No  Always wore protective glasses? NA Ever got metal in your eyes?  NA  If yes, was it removed by a doctor?  NA  Brain Surgery NO   Inner Ear Surgery  NO  Renal/Liver Dz  NO  Heart Surgery    NO (if yes, what type and when N/A )  Pacemaker   NO  High BP  YES  Eye Implants  NO  Pregnancy  NO  Diabetic  YES  Stent  NO (If yes, date of placement N/A )  Hx of cancer NO  (If yes, what kind? N/A )   1.  Report to radiology on first floor on Thursday 03/21/13   At  1200 Noon am/pm  2.  Do Not eat food after 2400 Wed 03/20/13         Do Not drink clear liquids after  2400 Wed 03/20/13        3.  If discharged the same day as procedure, you will need a responsible adult to drive          You home.  Who will drive you home daughter, Leavy Cella   4.  If discharged the day of your procedure, a responsible adult should be with the patient      For 8 hours - daughter Leavy Cella  5.  If plans include a taxi or bus for transportation home after procedure, a friend or family      member must accompany you N/A - daughter Leavy Cella to transport pt home     6.  If taking routine medications, please list the names and dosages of all your                   medications, or bring these medications to the hospital for identification - verified current RX with pt at pre procedure screening call per Epic - No longer taking Xanax, Hydrocodone or Doxycyline   7.  Take usual medications the morning of your procedure, except Insulin and Metformin   8.  Do you have a history of heart problems?  NO  9.  Do you have a cardiologist? NO   10.  Do you have any metal or implants inside your body? NO  11.  Do you have any allergies to food or medications? YES,  ASA, Ibuprofen, PCN  12.  Do you have any allergies to latex or  contrast dye? No  13.  To whom were these instructions given? Patient, [name]

## 2013-03-21 ENCOUNTER — Ambulatory Visit (HOSPITAL_COMMUNITY): Admission: RE | Admit: 2013-03-21 | Payer: No Typology Code available for payment source | Source: Ambulatory Visit

## 2013-03-26 NOTE — Addendum Note (Signed)
Addended by: Neomia Dear on: 03/26/2013 03:55 PM   Modules accepted: Orders

## 2013-03-28 ENCOUNTER — Encounter (HOSPITAL_COMMUNITY): Payer: Self-pay

## 2013-03-28 ENCOUNTER — Ambulatory Visit (HOSPITAL_COMMUNITY)
Admission: RE | Admit: 2013-03-28 | Discharge: 2013-03-28 | Disposition: A | Discharge status: Home / Self Care | Payer: No Typology Code available for payment source | Source: Ambulatory Visit | Attending: Internal Medicine | Admitting: Internal Medicine

## 2013-03-28 ENCOUNTER — Encounter: Payer: No Typology Code available for payment source | Admitting: Internal Medicine

## 2013-03-28 VITALS — BP 135/98 | HR 81 | Temp 97.6°F | Resp 14

## 2013-03-28 DIAGNOSIS — S98119A Complete traumatic amputation of unspecified great toe, initial encounter: Secondary | ICD-10-CM | POA: Insufficient documentation

## 2013-03-28 DIAGNOSIS — IMO0002 Reserved for concepts with insufficient information to code with codable children: Secondary | ICD-10-CM | POA: Insufficient documentation

## 2013-03-28 DIAGNOSIS — R42 Dizziness and giddiness: Secondary | ICD-10-CM | POA: Insufficient documentation

## 2013-03-28 DIAGNOSIS — E1165 Type 2 diabetes mellitus with hyperglycemia: Secondary | ICD-10-CM | POA: Insufficient documentation

## 2013-03-28 DIAGNOSIS — E118 Type 2 diabetes mellitus with unspecified complications: Secondary | ICD-10-CM | POA: Insufficient documentation

## 2013-03-28 DIAGNOSIS — I10 Essential (primary) hypertension: Secondary | ICD-10-CM | POA: Insufficient documentation

## 2013-03-28 MED ORDER — FENTANYL CITRATE 0.05 MG/ML IJ SOLN
INTRAMUSCULAR | Status: DC | PRN
  Administered 2013-03-28 (×2): 25 ug via INTRAVENOUS

## 2013-03-28 MED ORDER — MIDAZOLAM HCL 2 MG/2ML IJ SOLN
INTRAMUSCULAR | Status: AC
  Filled 2013-03-28: qty 10

## 2013-03-28 MED ORDER — MIDAZOLAM HCL 2 MG/2ML IJ SOLN
INTRAMUSCULAR | Status: DC | PRN
  Administered 2013-03-28: 0.5 mg via INTRAVENOUS
  Administered 2013-03-28 (×2): 1 mg via INTRAVENOUS

## 2013-03-28 MED ORDER — MIDAZOLAM HCL 2 MG/2ML IJ SOLN: 1.0000 mg | INTRAMUSCULAR | Status: DC | PRN

## 2013-03-28 MED ORDER — FENTANYL CITRATE 0.05 MG/ML IJ SOLN
INTRAMUSCULAR | Status: AC
  Filled 2013-03-28: qty 4

## 2013-03-28 MED ORDER — FENTANYL CITRATE 0.05 MG/ML IJ SOLN: 25.0000 ug | INTRAMUSCULAR | Status: DC | PRN

## 2013-03-28 NOTE — Addendum Note (Signed)
Addended by: Priscella Mann on: 03/28/2013 06:29 PM   Modules accepted: Orders

## 2013-03-28 NOTE — H&P (Signed)
[name] is an 52 y.o. female.   Chief Complaint: Dizziness x 2 months Scheduled now for MRI head/brain with sedation HPI: HTN; DM; Asthma  Past Medical History  Diagnosis Date  . Diabetes mellitus   . Hypertension   . Asthma 11/01/2010    Past Surgical History  Procedure Laterality Date  . 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 Diabetes Father   . Diabetes Daughter    Social History:  reports that she has never smoked. She does not have any smokeless tobacco history on file. She reports that she does not drink alcohol or use illicit drugs.  Allergies:  Allergies  Allergen Reactions  . Aspirin Nausea And Vomiting  . Ibuprofen Nausea And Vomiting and Other (See Comments)    abd pain  . Penicillins Other (See Comments)    unknown     (Not in a hospital admission)  No results found for this or any previous visit (from the past 48 hour(s)). No results found.  Review of Systems  Constitutional: Negative for fever.  Respiratory: Negative for shortness of breath.   Cardiovascular: Negative for chest pain.  Gastrointestinal: Negative for nausea and vomiting.  Neurological: Positive for dizziness.    Blood pressure 103/69, pulse 86, temperature 97.6 F (36.4 C), temperature source Oral, resp. rate 15, SpO2 97.00%. Physical Exam  Constitutional: She is oriented to person, place, and time. She appears well-developed and well-nourished.  Cardiovascular: Normal rate, regular rhythm and normal heart sounds.   No murmur heard. Respiratory: Effort normal and breath sounds normal. She has no wheezes.  GI: Soft. Bowel sounds are normal. There is no tenderness.  Musculoskeletal: Normal range of motion.  Neurological: She is alert and oriented to person, place, and time.  Psychiatric: She has a  normal mood and affect. Her behavior is normal. Judgment and thought content normal.     Assessment/Plan Dizziness MRI with sedation Pt aware of procedure benefits and risks and agreeable to proceed Consent signed and in chart  Sadi Arave A 03/28/2013, 1:25 PM

## 2013-04-10 ENCOUNTER — Encounter (HOSPITAL_BASED_OUTPATIENT_CLINIC_OR_DEPARTMENT_OTHER): Payer: No Typology Code available for payment source | Attending: General Surgery

## 2013-04-10 DIAGNOSIS — S98119A Complete traumatic amputation of unspecified great toe, initial encounter: Secondary | ICD-10-CM | POA: Insufficient documentation

## 2013-04-10 DIAGNOSIS — E1169 Type 2 diabetes mellitus with other specified complication: Secondary | ICD-10-CM | POA: Insufficient documentation

## 2013-04-11 ENCOUNTER — Encounter: Payer: Self-pay | Admitting: Internal Medicine

## 2013-04-11 ENCOUNTER — Ambulatory Visit (INDEPENDENT_AMBULATORY_CARE_PROVIDER_SITE_OTHER): Payer: No Typology Code available for payment source | Admitting: Internal Medicine

## 2013-04-11 VITALS — BP 125/71 | HR 85 | Temp 97.1°F | Ht 73.0 in | Wt 274.7 lb

## 2013-04-11 DIAGNOSIS — M25532 Pain in left wrist: Secondary | ICD-10-CM

## 2013-04-11 DIAGNOSIS — M25539 Pain in unspecified wrist: Secondary | ICD-10-CM

## 2013-04-11 DIAGNOSIS — R42 Dizziness and giddiness: Secondary | ICD-10-CM

## 2013-04-11 DIAGNOSIS — I10 Essential (primary) hypertension: Secondary | ICD-10-CM

## 2013-04-11 HISTORY — DX: Pain in unspecified wrist: M25.539

## 2013-04-11 NOTE — Assessment & Plan Note (Addendum)
Vertigo now resolved. Possibly a side effect of lisinopril as symptoms stopped completely once patient was off of this medication  -MRI unremarkable -No need to go see ENT

## 2013-04-11 NOTE — Assessment & Plan Note (Addendum)
Likely overuse injury. No thenar/hypothenar atrophy  -Ibuprofen scheduled -Ice -Continue splints at night -patient given wrist exercises to work on daily -f/u several weeks

## 2013-04-11 NOTE — Patient Instructions (Addendum)
Please return to clinic in one month for blood pressure check.  Wrist Pain Wrist injuries are frequent in adults and children. A sprain is an injury to the ligaments that hold your bones together. A strain is an injury to muscle or muscle cord-like structures (tendons) from stretching or pulling. Generally, when wrists are moderately tender to touch following a fall or injury, a break in the bone (fracture) may be present. Most wrist sprains or strains are better in 3 to 5 days, but complete healing may take several weeks. HOME CARE INSTRUCTIONS   Put ice on the injured area.  Put ice in a plastic bag.  Place a towel between your skin and the bag.  Leave the ice on for 15 to 20 minutes, 3 to 4 times a day, for the first 2 days.  Keep your arm raised above the level of your heart whenever possible to reduce swelling and pain.  Rest the injured area for at least 48 hours or as directed by your caregiver.  If a splint or elastic bandage has been applied, use it for as long as directed by your caregiver or until seen by a caregiver for a follow-up exam.  Only take over-the-counter or prescription medicines for pain, discomfort, or fever as directed by your caregiver.  Keep all follow-up appointments. You may need to follow up with a specialist or have follow-up X-rays. Improvement in pain level is not a guarantee that you did not fracture a bone in your wrist. The only way to determine whether or not you have a broken bone is by X-ray. SEEK IMMEDIATE MEDICAL CARE IF:   Your fingers are swollen, very red, white, or cold and blue.  Your fingers are numb or tingling.  You have increasing pain.  You have difficulty moving your fingers. MAKE SURE YOU:   Understand these instructions.  Will watch your condition.  Will get help right away if you are not doing well or get worse. Document Released: 08/24/2005 Document Revised: 02/06/2012 Document Reviewed: 01/05/2011 [facility] Patient  Information 2013 [place], [place]. Wrist Splint A wrist splint is a brace that holds your wrist in a fixed position. It can be used to stabilize your wrist so that broken bones and sprains can heal faster, with less pain. It can also help to relieve pressure on the nerve that runs down the middle of your arm (median nerve). Splints are available in drugstores without a prescription. They are also available by prescription from orthopedic and medical supply stores. Custom splints made from lightweight materials can be made by physical or occupational therapist. HOME CARE INSTRUCTIONS  Wear your splint as instructed by your caregiver. It may be worn while you sleep.  Your caregiver may instruct you how to perform certain exercises at home. These exercises help maintain muscle strength in your hand and wrist. They also help to maintain motion in your fingers. SEEK MEDICAL CARE IF:  You start to lose feeling in your hand or fingers.  Your skin or fingernails turn blue or gray, or they feel cold. MAKE SURE YOU:   Understand these instructions.  Will watch your condition.  Will get help right away if you are not doing well or get worse. Document Released: 10/27/2006 Document Revised: 02/06/2012 Document Reviewed: 07/25/2011 [facility] Patient Information 2013 [place], [place]. Wrist Exercises RANGE OF MOTION (ROM) AND STRETCHING EXERCISES - Wrist Sprain  These exercises may help you when beginning to rehabilitate your injury. Your symptoms may resolve with or without further  involvement from your physician, physical therapist or athletic trainer. While completing these exercises, remember:   Restoring tissue flexibility helps normal motion to return to the joints. This allows healthier, less painful movement and activity.  An effective stretch should be held for at least 30 seconds.  A stretch should never be painful. You should only feel a gentle lengthening or release in the stretched  tissue. RANGE OF MOTION  Wrist Flexion, Active-Assisted  Extend your right / left elbow with your palm pointing down.*  Gently pull the back of your hand towards you until you feel a gentle stretch on the top of your forearm.  Hold this position for __________ seconds. Repeat __________ times. Complete this exercise __________ times per day.  *If directed by your physician, physical therapist or athletic trainer, complete this stretch with your elbow bent rather than extended. RANGE OF MOTION  Wrist Extension, Active-Assisted   Extend your right / left elbow and turn your palm upwards.*  Gently pull your palm/fingertips back so your wrist extends and your fingers point more toward the ground.  You should feel a gentle stretch on the inside of your forearm.  Hold this position for __________ seconds. Repeat __________ times. Complete this exercise __________ times per day. *If directed by your physician, physical therapist or athletic trainer, complete this stretch with your elbow bent, rather than extended. RANGE OF MOTION  Supination, Active   Stand or sit with your elbows at your side. Bend your right / left elbow to 90 degrees.  Turn your palm upward until you feel a gentle stretch on the inside of your forearm.  Hold this position for __________ seconds. Slowly release and return to the starting position. Repeat __________ times. Complete this stretch __________ times per day.  RANGE OF MOTION  Pronation, Active   Stand or sit with your elbows at your side. Bend your right / left elbow to 90 degrees.  Turn your palm downward until you feel a gentle stretch on the top of your forearm.  Hold this position for __________ seconds. Slowly release and return to the starting position. Repeat __________ times. Complete this stretch __________ times per day.  STRENGTHENING EXERCISES  These exercises may help you when beginning to rehabilitate your injury. They may resolve your  symptoms with or without further involvement from your physician, physical therapist or athletic trainer. While completing these exercises, remember:   Muscles can gain both the endurance and the strength needed for everyday activities through controlled exercises.  Complete these exercises as instructed by your physician, physical therapist or athletic trainer. Progress the resistance and repetitions only as guided.  You may experience muscle soreness or fatigue, but the pain or discomfort you are trying to eliminate should never worsen during these exercises. If this pain does worsen, stop and make certain you are following the directions exactly. If the pain is still present after adjustments, discontinue the exercise until you can discuss the trouble with your clinician. STRENGTH Wrist Flexors  Sit with your right / left forearm palm-up and fully supported. Your elbow should be resting below the height of your shoulder. Allow your wrist to extend over the edge of the surface.  Loosely holding a __________ weight or a piece of rubber exercise band/tubing, slowly curl your hand up toward your forearm.  Hold this position for __________ seconds. Slowly lower the wrist back to the starting position in a controlled manner. Repeat __________ times. Complete this exercise __________ times per day.  STRENGTH  Wrist Extensors  Sit with your right / left forearm palm-down and fully supported. Your elbow should be resting below the height of your shoulder. Allow your wrist to extend over the edge of the surface.  Loosely holding a __________ weight or a piece of rubber exercise band/tubing, slowly curl your handup toward your forearm.  Hold this position for __________ seconds. Slowly lower the wrist back to the starting position in a controlled manner. Repeat __________ times. Complete this exercise __________ times per day.  STRENGTH  Forearm Supinators  Sit with your right / left forearm  supported on a table, keeping your elbow below shoulder height. Rest your hand over the edge, palm down.  Gently grip a hammer or a soup ladle.  Without moving your elbow, slowly turn your palm and hand upward to a "thumbs-up" position.  Hold this position for __________ seconds. Slowly return to the starting position. Repeat __________ times. Complete this exercise __________ times per day.  STRENGTH  Forearm Pronators   Sit with your right / left forearm supported on a table, keeping your elbow below shoulder height. Rest your hand over the edge, palm up.  Gently grip a hammer or a soup ladle.  Without moving your elbow, slowly turn your palm and hand upward to a "thumbs-up" position.  Hold this position for __________ seconds. Slowly return to the starting position. Repeat __________ times. Complete this exercise __________ times per day.  STRENGTH - Grip  Grasp a tennis ball, a dense sponge, or a large, rolled sock in your hand.  Squeeze as hard as you can without increasing any pain.  Hold this position for __________ seconds. Release your grip slowly. Repeat __________ times. Complete this exercise __________ times per day.  Document Released: 09/28/2005 Document Revised: 02/06/2012 Document Reviewed: 02/26/2009 [employer] Patient Information 2013 [place], [place].

## 2013-04-11 NOTE — Assessment & Plan Note (Addendum)
  Return to clinic in one month for blood pressure check -would use another agent besides lisinopril as patient had dizziness with this medicine

## 2013-04-11 NOTE — Progress Notes (Signed)
Patient ID: Rockie Vawter, female   DOB: 01/25/1961, 52 y.o.   MRN: [id number] Internal Medicine Clinic Visit    HPI:  Anniya Whiters is a 52 y.o. year old female with a history of vertigo, hypertension, diabetes, who presents with a chief complaint of wrist pain.  Patient states that her wrist pain started in late December when she was [place] a lot [place] the airport. She got a brace which she used and several weeks later, the pain went away. Pain is located on the left hand, mostly around the thumb and first 2 fingers which are painful when she stretches or extend the fingers. It started back again about 2 weeks ago. She does not remember any trauma to the area. She states that the pain is there all the time, throbbing. She has tried to take tramadol which partially relieved the pain. She denies any nighttime symptoms of numbness or tingling or pain in the middle of the night. She does not have any numbness. She is right handed but uses her left hand for lots of activities as well.  Patient has an appointment with ENT to evaluate vertigo. She recently had an MRI which was unremarkable for a cause of her dizzy spells. However, patient notes that she has not had any further dizzy spells since she stopped taking lisinopril. Her symptoms completely stopped, whereas she used to have spells of dizziness occurring 3-4 times per day. Patient asks whether she should still go to the ENT appointment.  Patient denies any fever, chills, chest pain, shortness of breath, urinary frequency, headaches.    Past Medical History  Diagnosis Date  . Diabetes mellitus   . Hypertension   . Asthma 11/01/2010    Past Surgical History  Procedure Laterality Date  . 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  Allergies: Aspirin; Ibuprofen; and Penicillins  Medications: Current Outpatient Prescriptions  Medication Sig Dispense Refill  . ALPRAZolam (XANAX) 0.5 MG tablet Take 1 tablet (0.5 mg total) by mouth once.  1 tablet  0  . aspirin 81 MG tablet Take 1 tablet (81 mg total) by mouth daily.  30 tablet    . Blood Glucose Monitoring Suppl (BLOOD GLUCOSE METER) kit Use as instructed  1 each  0  . doxycycline (VIBRA-TABS) 100 MG tablet Take 1 tablet (100 mg total) by mouth 2 (two) times daily.  28 tablet  0  . glucose blood test strip Use to check blood sugar up to 3 times a day before meals Dx code- 250.00  100 each  12  . HYDROcodone-acetaminophen (NORCO/VICODIN) 5-325 MG per tablet Take 1-2 tablets by mouth every 4 (four) hours as needed.  45 tablet  0  . insulin NPH-insulin regular (NOVOLIN 70/30) (70-30) 100 UNIT/ML injection Inject into the skin 2 (two) times daily with a meal. Take 45 units Q AM and 35 units Q PM      . Insulin Syringe-Needle U-100 31G X 5/16" 0.5 ML MISC 1 each by Does not apply route 2 (two) times daily.  100 each  12  . lisinopril (PRINIVIL,ZESTRIL) 10 MG tablet Take 1 tablet (10 mg total) by mouth daily.  30 tablet  1  . metFORMIN (GLUCOPHAGE) 1000 MG tablet Take 1,000 mg by  mouth 2 (two) times daily with a meal.      . omeprazole (PRILOSEC) 20 MG capsule Take 1 capsule (20 mg total) by mouth daily.  30 capsule  1  . [DISCONTINUED] benazepril (LOTENSIN) 10 MG tablet Take 10 mg by mouth daily.      . [DISCONTINUED] insulin regular (NOVOLIN R,HUMULIN R) 100 units/mL injection Inject 0.15 mLs (15 Units total) into the skin 3 (three) times daily before meals.  10 mL  12  . [DISCONTINUED] potassium chloride (K-DUR) 10 MEQ tablet Take 20 mEq by mouth daily.       No current facility-administered medications for this visit.    History   Social History  . Marital Status: Legally Separated    Spouse Name: N/A    Number of Children: N/A  . Years of  Education: N/A   Occupational History  . Not on file.   Social History Main Topics  . Smoking status: Never Smoker   . Smokeless tobacco: Not on file  . Alcohol Use: No  . Drug Use: No  . Sexually Active: Not on file   Other Topics Concern  . Not on file   Social History Narrative  . No narrative on file    family history includes Diabetes in her daughter and father.  Physical Exam There were no vitals taken for this visit. General:  No acute distress, alert and oriented x 3, well-appearing AAF HEENT:  PERRL, EOMI, no lymphadenopathy, moist mucous membranes Cardiovascular:  Regular rate and rhythm, no murmurs, rubs or gallops Respiratory:  Clear to auscultation bilaterally, no wheezes, rales, or rhonchi Abdomen:  Soft, nondistended, nontender, normoactive bowel sounds Extremities:  Warm and well-perfused, no clubbing, cyanosis, or edema.  MSK: Left hand and wrist without any obvious deformities. No evidence of trauma, no bruising, no swelling. Sensation is intact bilateral upper extremities. Strength is intact, 5/5, symmetric. Patient does have significant pain on extension of her pointer and middle fingers on the left hand. She also has some tenderness around the wrist joint. Skin: Warm, dry, no rashes Neuro: CN intact, no nystagmus  Labs: Lab Results  Component Value Date   CREATININE 0.48* 02/20/2013   BUN 7 02/20/2013   NA 140 02/20/2013   K 4.2 02/20/2013   CL 105 02/20/2013   CO2 25 02/20/2013   Lab Results  Component Value Date   WBC 5.8 02/20/2013   HGB 11.3* 02/20/2013   HCT 34.1* 02/20/2013   MCV 92.9 02/20/2013   PLT 215 02/20/2013      Assessment and Plan:    FOLLOWUP: [place] Lusty Luo will follow back up in our clinic in approximately one month. [place] [name] knows to call out clinic in the meantime with any questions or new issues.

## 2013-04-22 NOTE — Progress Notes (Signed)
INTERNAL MEDICINE TEACHING ATTENDING ADDENDUM: I discussed this case with Dr. Kesty soon after the patient visit. I have read the documentation and I agree with the plan of care. Please see the resident note for details of management. 

## 2013-04-26 ENCOUNTER — Ambulatory Visit (INDEPENDENT_AMBULATORY_CARE_PROVIDER_SITE_OTHER): Payer: No Typology Code available for payment source | Admitting: Dietician

## 2013-04-26 VITALS — Wt 274.8 lb

## 2013-04-26 DIAGNOSIS — E119 Type 2 diabetes mellitus without complications: Secondary | ICD-10-CM

## 2013-04-26 NOTE — Progress Notes (Signed)
Diabetes Self-Management Education  Visit Number: Follow-up 1  04/26/2013 [name], identified by name and date of birth, is a 52 y.o. female with Type 2 diabetes  .  Other people present during visit: daughter [name]  ASSESSMENT  Patient Concerns:  Medication;Glycemic Control  There were no vitals taken for this visit. There is no weight on file to calculate BMI.  Lab Results: LDL Cholesterol  Date Value Range Status  01/10/2011 114        Total Cholesterol/HDL:CHD Risk Coronary Heart Disease Risk Table                     Men   Women  1/2 Average Risk   3.4   3.3  Average Risk       5.0   4.4  2 X Average Risk   9.6   7.1  3 X Average Risk  23.4   11.0        Use the calculated Patient Ratio above and the CHD Risk Table to determine the patient's CHD Risk.        ATP III CLASSIFICATION (LDL):  <100     mg/dL   Optimal  782-956  mg/dL   Near or Above                    Optimal  130-159  mg/dL   Borderline  213-086  mg/dL   High  >578     mg/dL   Very High* 0 - 99 mg/dL Final     Hemoglobin I6N  Date Value Range Status  02/20/2013 11.5* <5.7 % Final     (NOTE)                                                                               According to the ADA Clinical Practice Recommendations for 2011, when     HbA1c is used as a screening test:      >=6.5%   Diagnostic of Diabetes Mellitus               (if abnormal result is confirmed)     5.7-6.4%   Increased risk of developing Diabetes Mellitus     References:Diagnosis and Classification of Diabetes Mellitus,Diabetes     Care,2011,34(Suppl 1):S62-S69 and Standards of Medical Care in             Diabetes - 2011,Diabetes Care,2011,34 (Suppl 1):S11-S61.     Family History  Problem Relation Age of Onset  . Diabetes Father   . Diabetes Daughter    History  Substance Use Topics  . Smoking status: Never Smoker   . Smokeless tobacco: Not on file  . Alcohol Use: No    Support Systems:  Other (comment) Special Needs:      Prior DM Education:   Daily Foot Exams:   Patient Belief / Attitude about Diabetes:     Assessment comments: patient not reading labels for carbs, but more aware of what foods contain carbs. She did not check pre and post meal blood sugar or bring her meter with her today. She states her blood sugars have been 100-150 range and she has been checking  3 times a day. Her main  Concern today is if she should go back to 70/30 insulin or stay on what she is on currently- lantus and Novolog. Has meeting with MAP next week- had taken Rxs from previous visit at Urgent care to them  for lantus and Novolog. .   Diet reasonable, may be insufficient in fruits/vegetables whole grain and water.   Individualized Plan for Diabetes Self-Management Training:  Patient individualized diabetes plan discussed today with patient and includes: Nutrition, medications, monitoring, how to handle highs and lows,  Education Topics Reviewed with Patient Today:  Topic Points Discussed  Disease State    Nutrition Management Reviewed blood glucose goals for pre and post meals and how to evaluate the patients' food intake on their blood glucose level.;Meal timing in regards to the patients' current diabetes medication.  Physical Activity and Exercise    Medications Reviewed patients medication for diabetes, action, purpose, timing of dose and side effects.  Monitoring Purpose and frequency of SMBG.  Acute Complications Other (comment)  Chronic Complications    Psychosocial Adjustment    Goal Setting    Preconception Care (if applicable)      PATIENTS GOALS   Learning Objective(s):     Goal The patient agrees to:  Nutrition    Physical Activity    Medications    Monitoring test blood glucose pre and post meals as discussed  Problem Solving    Reducing Risk    Health Coping     Patient Self-Evaluation of Goals (Subsequent Visits)  Goal The patient rates self as meeting goals (% of time)  Nutrition     Physical Activity    Medications    Monitoring < 25%  Problem Solving    Reducing Risk    Health Coping       PERSONALIZED PLAN / SUPPORT  Self-Management Support:  Doctor's office;Friends;Family;CDE visits ______________________________________________________________________  Outcomes  Expected Outcomes:  Demonstrated interest in learning. Expect positive outcomes Self-Care Barriers:  Lack of material resources Education material provided: yes If problems or questions, patient to contact team via:  Phone Time in: 1100     Time out: 1200 Future DSME appointment: - 4-6 wks   [name], [name] 04/26/2013 12:26 PM

## 2013-04-26 NOTE — Patient Instructions (Addendum)
You are doing a great job caring for your diabetes!  Keep working on before meal and 2 hours after checks to learn about how the Novolog and food are working together.  Your cholesterol (lipid panel), foot exam are due and will be done when you see the doctor.   Please make a follow up appointment with your doctor in 2 weeks(mid June) and me in 4-6 weeks or when you are ready for the next step in learning about food. Insulin relationship.  Call  anytime [phone]

## 2013-05-13 ENCOUNTER — Encounter: Payer: Self-pay | Admitting: Internal Medicine

## 2013-05-13 ENCOUNTER — Ambulatory Visit (INDEPENDENT_AMBULATORY_CARE_PROVIDER_SITE_OTHER): Payer: No Typology Code available for payment source | Admitting: Internal Medicine

## 2013-05-13 VITALS — BP 131/74 | HR 92 | Temp 97.4°F | Ht 73.0 in | Wt 280.4 lb

## 2013-05-13 DIAGNOSIS — R7989 Other specified abnormal findings of blood chemistry: Secondary | ICD-10-CM

## 2013-05-13 DIAGNOSIS — E1149 Type 2 diabetes mellitus with other diabetic neurological complication: Secondary | ICD-10-CM

## 2013-05-13 DIAGNOSIS — R946 Abnormal results of thyroid function studies: Secondary | ICD-10-CM

## 2013-05-13 DIAGNOSIS — E119 Type 2 diabetes mellitus without complications: Secondary | ICD-10-CM

## 2013-05-13 HISTORY — DX: Other specified abnormal findings of blood chemistry: R79.89

## 2013-05-13 LAB — POCT GLYCOSYLATED HEMOGLOBIN (HGB A1C): Hemoglobin A1C: 9

## 2013-05-13 LAB — GLUCOSE, CAPILLARY: Glucose-Capillary: 112 mg/dL — ABNORMAL HIGH (ref 70–99)

## 2013-05-13 MED ORDER — GABAPENTIN 300 MG PO CAPS: 300.0000 mg | ORAL_CAPSULE | Freq: Three times a day (TID) | ORAL | Status: DC

## 2013-05-13 NOTE — Assessment & Plan Note (Addendum)
Lab Results  Component Value Date   HGBA1C 9.0 05/13/2013   HGBA1C 11.5* 02/20/2013   HGBA1C 7.6* 08/08/2012     Assessment: Diabetes control: poor control (HgbA1C >9%) Progress toward A1C goal:  improved Comments:   Plan:  Home glucose monitoring: Frequency: once a day Timing:   Instruction/counseling given: reminded to get eye exam, reminded to bring blood glucose meter & log to each visit, reminded to bring medications to each visit and discussed foot care Educational resources provided: brochure;handout Self management tools provided:   Other plans:   1. Patient is on Lantus 50 units QHS and Novolog 15 units TID with meals. She reports medical compliance with her treatment.  And her hemoglobin A1c is getting better.    However, she is adamant about not taking the current regimen because " I want to to take Lantus and novolog 70/30 as I did three year ago before I moved here." Patient states that she was taking two long acting insulins including Lantus and NovoLog 7030             3 years ago when " I had better A1c." . And she changed her story by saying " I was not taking it everyday" when I reminded her that her hemoglobin A1c was 12.5 in October 09 2010.               Patient was very aggressive and told me" I am not happy" when I explained to her that I prefer   not to switch her insulin regimen to two long acting insulin since there is no data to support this           therapy. With her not bring her meter today, it is even unsafe to titrate her insulin at present.              I will request for her to talk to [name] about the insulin regimen. She is instructed to check her   CBGS 4 times daily until she sees [name]. And she is instructed to continue CBG checks QID             during her insulin titration.

## 2013-05-13 NOTE — Patient Instructions (Addendum)
1. Will continue the current therapy. 2. Will schedule a followup appt with DM educator [name] in one week 3. Please check your blood sugars 4 times daily and bring your meter to every office visit with [name] and physician.  4. Follow up monthly with your PCP.   General Instructions:    Treatment Goals:  Goals (1 Years of Data) as of 05/13/13         As of Today 04/11/13 03/28/13 03/28/13 03/28/13     Blood Pressure    . Blood Pressure < 140/90  131/74 125/71 135/98 145/109 142/89     Result Component    . HEMOGLOBIN A1C < 7.0  9.0        . LDL CALC < 100            Progress Toward Treatment Goals:  Treatment Goal 05/13/2013  Hemoglobin A1C improved  Blood pressure at goal    Self Care Goals & Plans:  Self Care Goal 05/13/2013  Manage my medications take my medicines as prescribed; bring my medications to every visit; refill my medications on time; follow the sick day instructions if I am sick  Monitor my health keep track of my blood glucose; check my feet daily  Eat healthy foods eat more vegetables; eat fruit for snacks and desserts; eat baked foods instead of fried foods; eat smaller portions; drink diet soda or water instead of juice or soda  Be physically active take a walk every day; find an activity I enjoy  Meeting treatment goals maintain the current self-care plan    Home Blood Glucose Monitoring 05/13/2013  Check my blood sugar once a day     Care Management & Community Referrals:  Referral 05/13/2013  Referrals made for care management support diabetes educator

## 2013-05-13 NOTE — Assessment & Plan Note (Signed)
She reports bilateral feet tingling and burning sensation for years.  The physical examination reveals well-healed left great toe amputation wound.  Bilateral feet multiple calluses without skin ulcers or breakdown. 1+ PPP  - Will add gabapentin - Will send to podiatry referral for calluses. - Will perform ABI giving her risk factor of uncontrolled diabetes and the mild diminished peripheral pulses.

## 2013-05-13 NOTE — Assessment & Plan Note (Signed)
Mild elevated TSH is noted in 2012. Of note, patient is an new patient to our clinic since April 2014.   - will repeat TSH  Addendum Patient left the clinic without blood draw. Will have my nurse Stanton Kidney to call her back.

## 2013-05-13 NOTE — Progress Notes (Signed)
Subjective:   Patient ID: Ronnika Collett female   DOB: 09-08-1961 52 y.o.   MRN: [id number]  HPI: Ms.Erine [name] is a 52 y.o. 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   Please see my assessment and plan.   Past Medical History  Diagnosis Date  . Diabetes mellitus   . Hypertension   . Asthma 11/01/2010   Current Outpatient Prescriptions  Medication Sig Dispense Refill  . Blood Glucose Monitoring Suppl (BLOOD GLUCOSE METER) kit Use as instructed  1 each  0  . glucose blood test strip Use to check blood sugar up to 3 times a day before meals Dx code- 250.00  100 each  12  . HYDROcodone-acetaminophen (NORCO/VICODIN) 5-325 MG per tablet Take 1-2 tablets by mouth every 4 (four) hours as needed.  45 tablet  0  . insulin aspart (NOVOLOG) 100 UNIT/ML injection Inject 15 Units into the skin 3 (three) times daily with meals.      . insulin glargine (LANTUS) 100 UNIT/ML injection Inject 50 Units into the skin at bedtime.      . Insulin Syringe-Needle U-100 31G X 5/16" 0.5 ML MISC 1 each by Does not apply route 2 (two) times daily.  100 each  12  . metFORMIN (GLUCOPHAGE) 1000 MG tablet Take 1,000 mg by mouth 2 (two) times daily with a meal.      . omeprazole (PRILOSEC) 20 MG capsule Take 1 capsule (20 mg total) by mouth daily.  30 capsule  1  . [DISCONTINUED] benazepril (LOTENSIN) 10 MG tablet Take 10 mg by mouth daily.      . [DISCONTINUED] insulin regular (NOVOLIN R,HUMULIN R) 100 units/mL injection Inject 0.15 mLs (15 Units total) into the skin 3 (three) times daily before meals.  10 mL  12  . [DISCONTINUED] potassium chloride (K-DUR) 10 MEQ tablet Take 20 mEq by mouth daily.       No current facility-administered medications for this visit.   Family History  Problem Relation Age of Onset  . Diabetes Father   . Diabetes Daughter    History   Social History  . Marital Status: Legally  Separated    Spouse Name: N/A    Number of Children: N/A  . Years of Education: N/A   Social History Main Topics  . Smoking status: Never Smoker   . Smokeless tobacco: None  . Alcohol Use: No  . Drug Use: No  . Sexually Active: None   Other Topics Concern  . None   Social History Narrative  . None   Review of Systems: Review of Systems:  Constitutional:  Denies fever, chills, diaphoresis, appetite change and fatigue.   HEENT:  Denies congestion, sore throat, rhinorrhea, sneezing, mouth sores, trouble swallowing, neck pain   Respiratory:  Denies SOB, DOE, cough, and wheezing.   Cardiovascular:  Denies palpitations and leg swelling.   Gastrointestinal:  Denies nausea, vomiting, abdominal pain, diarrhea, constipation, blood in stool and abdominal distention.   Genitourinary:  Denies dysuria, urgency, frequency, hematuria, flank pain and difficulty urinating.   Musculoskeletal:  Denies myalgias, back pain, joint swelling, arthralgias and gait problem. Bilateral feet burning and pain.   Skin:  Denies pallor, rash and wound.   Neurological:  Denies dizziness, seizures, syncope, weakness, light-headedness, numbness and headaches.    .    Objective:  Physical Exam: Filed Vitals:   05/13/13 1503  BP: 131/74  Pulse:  92  Temp: 97.4 F (36.3 C)  TempSrc: Oral  Height: 6\' 1"  (1.854 m)  Weight: 280 lb 6.4 oz (127.189 kg)  SpO2: 99%   General: alert, well-developed, and cooperative to examination.  Head: normocephalic and atraumatic.  Eyes: vision grossly intact, pupils equal, pupils round, pupils reactive to light, no injection and anicteric.  Mouth: pharynx pink and moist, no erythema, and no exudates.  Neck: supple, full ROM, no thyromegaly, no JVD, and no carotid bruits.  Lungs: normal respiratory effort, no accessory muscle use, normal breath sounds, no crackles, and no wheezes. Heart: normal rate, regular rhythm, no murmur, no gallop, and no rub.  Abdomen: soft,  non-tender, normal bowel sounds, no distention, no guarding, no rebound tenderness, no hepatomegaly, and no splenomegaly.  Msk: no joint swelling, no joint warmth, and no redness over joints.  Pulses: 1+ DP/PT pulses bilaterally Extremities: No cyanosis, clubbing, edema. Callous noted on the sole of B/L feet. Additional callous noted under the right great toe. No ulcer or skin breakdown.  Neurologic: alert & oriented X3, cranial nerves II-XII intact, strength normal in all extremities, sensation intact to light touch, and gait normal.  Skin: turgor normal and no rashes.  Psych: Oriented X3, memory intact for recent and remote, normally interactive, good eye contact, not anxious appearing, and not depressed appearing.    Assessment & Plan:

## 2013-05-16 ENCOUNTER — Telehealth: Payer: Self-pay | Admitting: Dietician

## 2013-05-16 NOTE — Telephone Encounter (Signed)
Patient called frustrated that she is doing what she should be doing and her blood sugars are too high, and higher than they had been.  247 fasting today, 200 yesterday, 276 last night at bedtime, not sick does not have an infection...getting in fair amount of activity.  Current dose 95 units a day Calculated estimated dose range 64-152 u/day.  Note weight increased, her prandial insulin could be insufficient for her intake or she could have bad insulin.   Patient to get new insulin today. Advised her to not change doses for now, monitor/record her food intake over weekend, she has an appointment with CDE on Monday

## 2013-05-17 NOTE — Telephone Encounter (Signed)
Agree with the pan. Thanks

## 2013-05-20 ENCOUNTER — Encounter: Payer: Self-pay | Admitting: Dietician

## 2013-05-20 ENCOUNTER — Other Ambulatory Visit: Payer: Self-pay | Admitting: Dietician

## 2013-05-20 ENCOUNTER — Ambulatory Visit (INDEPENDENT_AMBULATORY_CARE_PROVIDER_SITE_OTHER): Payer: No Typology Code available for payment source | Admitting: Dietician

## 2013-05-20 VITALS — Wt 279.5 lb

## 2013-05-20 DIAGNOSIS — E119 Type 2 diabetes mellitus without complications: Secondary | ICD-10-CM

## 2013-05-20 MED ORDER — INSULIN GLARGINE 100 UNIT/ML SOLOSTAR PEN: 50.0000 [IU] | PEN_INJECTOR | Freq: Every day | SUBCUTANEOUS | Status: DC

## 2013-05-20 MED ORDER — INSULIN ASPART 100 UNIT/ML FLEXPEN: PEN_INJECTOR | SUBCUTANEOUS | Status: DC

## 2013-05-20 NOTE — Telephone Encounter (Signed)
Patient requesting insulin pens instead of vials and desires to start using correction amount of Novolog

## 2013-05-20 NOTE — Patient Instructions (Addendum)
Start using correction Novolog insulin when your blood sugar is higher than 130 before meals. For every 20 points higher than starting at 130 mg/dl add 1 unit of Novolog to your 15 unit meal dose. Example: if your blood sugar is  155- add 1 unit novolog to your 15, so you would take 16 units Novolog before that meal.   I will ask for insulin pens instead of vials.

## 2013-05-20 NOTE — Progress Notes (Signed)
Diabetes Self-Management Education  Visit Number: Follow-up 2  05/20/2013 [name], identified by name and date of birth, is a 52 y.o. female with  .  Other people present during visit:      ASSESSMENT  Patient Concerns:     279.5#  Lab Results: LDL Cholesterol  Date Value Range Status  01/10/2011 114         0 - 99 mg/dL Final     Hemoglobin Z6X  Date Value Range Status  05/13/2013 9.0   Final  02/20/2013 11.5* <5.7 % Final     (NOTE)     Family History  Problem Relation Age of Onset  . Diabetes Father   . Diabetes Daughter    History  Substance Use Topics  . Smoking status: Never Smoker   . Smokeless tobacco: Not on file  . Alcohol Use: No     Assessment comments: patient not bring food record. She did check pre meal blood sugars well for past week. She states her blood sugars are better with new insulin, meter download shows average of 203 past 14 datyshave been 100-150 range and she has been checking 3 times a day. Patient taught how to correct high blood sugars using either diet or adjusting Novolog. Using teach back patient verbalized understanding. aslo- feel patient will benefit from diabetc shoes. Will try to assist with finding out patient best and affordable foot wear options   Individualized Plan for Diabetes Self-Management Training:  Patient individualized diabetes plan discussed today with patient and includes: Nutrition, medications, monitoring,  how to handle highs and lows  Education Topics Reviewed with Patient Today:  Topic Points Discussed  Disease State    Nutrition Management    Physical Activity and Exercise    Medications Reviewed patients medication for diabetes, action, purpose, timing of dose and side effects.;Reviewed medication adjustment guidelines for hyperglycemia and sick days.  Monitoring    Acute Complications    Chronic Complications Assessed and discussed foot care and prevention of foot problems  Psychosocial  Adjustment    Goal Setting    Preconception Care (if applicable)      PATIENTS GOALS   Learning Objective(s):     Goal The patient agrees to:  Nutrition    Physical Activity    Medications    Monitoring    Problem Solving    Reducing Risk Wear appropriate shoes and inserts  Health Coping     Patient Self-Evaluation of Goals (Subsequent Visits)  Goal The patient rates self as meeting goals (% of time)  Nutrition    Physical Activity    Medications    Monitoring 25 - 50%  Problem Solving    Reducing Risk    Health Coping       PERSONALIZED PLAN / SUPPORT  Self-Management Support:  Doctor's office;Friends;Family;Internet communities ______________________________________________________________________  Outcomes  Expected Outcomes:  Demonstrated interest in learning. Expect positive outcomes Self-Care Barriers:  Lack of material resources Education material provided: yes If problems or questions, patient to contact team via:  Phone and Email Time in: 1015    Time out: 1115  Future DSME appointment: - 4-6 wks   [name], [name] 05/20/2013 1:41 PM

## 2013-05-20 NOTE — Addendum Note (Signed)
Addended by: Blanch Media A on: 05/20/2013 03:48 PM   Modules accepted: Orders

## 2013-05-20 NOTE — Telephone Encounter (Signed)
CALLED TO PHARM.

## 2013-05-30 NOTE — Addendum Note (Signed)
Addended by: Neomia Dear on: 05/30/2013 10:55 AM   Modules accepted: Orders

## 2013-06-06 ENCOUNTER — Other Ambulatory Visit: Payer: Self-pay

## 2013-06-10 ENCOUNTER — Other Ambulatory Visit: Payer: Self-pay | Admitting: Internal Medicine

## 2013-06-10 DIAGNOSIS — Z1239 Encounter for other screening for malignant neoplasm of breast: Secondary | ICD-10-CM

## 2013-06-17 ENCOUNTER — Encounter: Payer: Self-pay | Admitting: Internal Medicine

## 2013-06-17 ENCOUNTER — Encounter: Payer: No Typology Code available for payment source | Admitting: Dietician

## 2013-06-19 ENCOUNTER — Telehealth: Payer: Self-pay | Admitting: *Deleted

## 2013-06-19 NOTE — Telephone Encounter (Signed)
CALLED PATIENT AT [phone](NOT IN SERVICE)/ LVM AT [phone]. [facility], 014 @ 11:45AM.  PATIENT TO ARRIVE @ 11:30 AM.  ALSO APPOINTMENT MAILED TO PATIENT.  [name] NTII [id number]

## 2013-06-21 ENCOUNTER — Ambulatory Visit (INDEPENDENT_AMBULATORY_CARE_PROVIDER_SITE_OTHER): Payer: Self-pay | Admitting: Family Medicine

## 2013-06-21 VITALS — BP 138/80 | Ht 73.0 in | Wt 280.0 lb

## 2013-06-21 DIAGNOSIS — M25579 Pain in unspecified ankle and joints of unspecified foot: Secondary | ICD-10-CM

## 2013-06-21 DIAGNOSIS — E1149 Type 2 diabetes mellitus with other diabetic neurological complication: Secondary | ICD-10-CM

## 2013-06-21 DIAGNOSIS — M869 Osteomyelitis, unspecified: Secondary | ICD-10-CM

## 2013-06-21 NOTE — Progress Notes (Signed)
  Subjective:    [name], female    DOB: 1960/12/11, 52 y.o.   MRN: [id number]  HPI Bilateral foot pain, long-standing. She is diabetic with elevated hemoglobin A1c. Has had to have left great toe amputated for a nonhealing ulcer. Says she's had foot pain off and on for the last several years. Her feet tend to get ulcers. She had a job where she has to stand much of the day and ultimately had to give up the job secondary to foot pain. No specific injury to her feet.   Review of Systems Foot burning bilaterally with some sensation of foot swelling. No fevers, no unusual weight gain or loss.    Objective:   Physical Exam  Vital signs are reviewed GENERAL: Well-developed overweight female no acute distress FEET: Bilaterally she has thick callused areas under the third metatarsal head with some ulceration. The left great toe is missing. Her skin is thickened also on the heel. Bilaterally she has mild pes planus.      Assessment & Plan:  #1. Foot pain with calluses and ulceration in a diabetic patient. She started lost one toe. Ideally she needs diabetic she's made that she has no insurance we will try to intervene with some diabetic shoe inserts. I will also place metatarsal pads, extra small, in these. His yard he has large calluses on his area I am not sure she can tolerate these for we need possible to transition her to medium or large metatarsal pads. I'll see her back in 2 weeks to see how she is tolerating these.

## 2013-06-28 ENCOUNTER — Ambulatory Visit (HOSPITAL_COMMUNITY)
Admission: RE | Admit: 2013-06-28 | Discharge: 2013-06-28 | Disposition: A | Discharge status: Home / Self Care | Payer: No Typology Code available for payment source | Source: Ambulatory Visit | Attending: Internal Medicine | Admitting: Internal Medicine

## 2013-06-28 DIAGNOSIS — Z1239 Encounter for other screening for malignant neoplasm of breast: Secondary | ICD-10-CM

## 2013-06-28 DIAGNOSIS — Z1231 Encounter for screening mammogram for malignant neoplasm of breast: Secondary | ICD-10-CM | POA: Insufficient documentation

## 2013-07-12 ENCOUNTER — Ambulatory Visit: Payer: Self-pay | Admitting: Family Medicine

## 2013-08-02 ENCOUNTER — Other Ambulatory Visit: Payer: Self-pay | Admitting: *Deleted

## 2013-08-02 DIAGNOSIS — E119 Type 2 diabetes mellitus without complications: Secondary | ICD-10-CM

## 2013-08-02 NOTE — Telephone Encounter (Signed)
Novolog flexpens are no longer offered for free @ [facility], would you consider changing back to vials?

## 2013-08-03 MED ORDER — INSULIN ASPART 100 UNIT/ML FLEXPEN: PEN_INJECTOR | SUBCUTANEOUS | Status: DC

## 2013-08-05 ENCOUNTER — Encounter: Payer: Self-pay | Admitting: Internal Medicine

## 2013-08-05 ENCOUNTER — Ambulatory Visit (INDEPENDENT_AMBULATORY_CARE_PROVIDER_SITE_OTHER): Payer: Self-pay | Admitting: Internal Medicine

## 2013-08-05 VITALS — BP 129/79 | HR 77 | Temp 97.4°F | Ht 73.0 in | Wt 284.5 lb

## 2013-08-05 DIAGNOSIS — Z23 Encounter for immunization: Secondary | ICD-10-CM

## 2013-08-05 DIAGNOSIS — K219 Gastro-esophageal reflux disease without esophagitis: Secondary | ICD-10-CM

## 2013-08-05 DIAGNOSIS — M25539 Pain in unspecified wrist: Secondary | ICD-10-CM

## 2013-08-05 DIAGNOSIS — E119 Type 2 diabetes mellitus without complications: Secondary | ICD-10-CM

## 2013-08-05 DIAGNOSIS — I10 Essential (primary) hypertension: Secondary | ICD-10-CM

## 2013-08-05 DIAGNOSIS — R7989 Other specified abnormal findings of blood chemistry: Secondary | ICD-10-CM

## 2013-08-05 DIAGNOSIS — R946 Abnormal results of thyroid function studies: Secondary | ICD-10-CM

## 2013-08-05 DIAGNOSIS — M25532 Pain in left wrist: Secondary | ICD-10-CM

## 2013-08-05 LAB — POCT GLYCOSYLATED HEMOGLOBIN (HGB A1C): Hemoglobin A1C: 9.2

## 2013-08-05 MED ORDER — INSULIN ASPART 100 UNIT/ML FLEXPEN: PEN_INJECTOR | SUBCUTANEOUS | Status: DC

## 2013-08-05 MED ORDER — BLOOD GLUCOSE METER KIT: PACK | Status: DC

## 2013-08-05 MED ORDER — OMEPRAZOLE 20 MG PO CPDR: 20.0000 mg | DELAYED_RELEASE_CAPSULE | Freq: Every day | ORAL | Status: DC

## 2013-08-05 MED ORDER — HYDROCHLOROTHIAZIDE 12.5 MG PO CAPS: 12.5000 mg | ORAL_CAPSULE | Freq: Every day | ORAL | Status: DC

## 2013-08-05 MED ORDER — "INSULIN SYRINGE-NEEDLE U-100 31G X 5/16"" 0.5 ML MISC": 1.0000 | Freq: Two times a day (BID) | Status: DC

## 2013-08-05 MED ORDER — INSULIN GLARGINE 100 UNIT/ML SOLOSTAR PEN: 55.0000 [IU] | PEN_INJECTOR | Freq: Every day | SUBCUTANEOUS | Status: DC

## 2013-08-05 MED ORDER — GLUCOSE BLOOD VI STRP: ORAL_STRIP | Status: DC

## 2013-08-05 MED ORDER — METFORMIN HCL 1000 MG PO TABS: 1000.0000 mg | ORAL_TABLET | Freq: Two times a day (BID) | ORAL | Status: DC

## 2013-08-05 MED ORDER — INSULIN GLARGINE 100 UNIT/ML SOLOSTAR PEN: 50.0000 [IU] | PEN_INJECTOR | Freq: Every day | SUBCUTANEOUS | Status: DC

## 2013-08-05 MED ORDER — LISINOPRIL 5 MG PO TABS: 5.0000 mg | ORAL_TABLET | Freq: Every day | ORAL | Status: DC

## 2013-08-05 MED ORDER — TRAMADOL HCL 50 MG PO TABS: 50.0000 mg | ORAL_TABLET | Freq: Four times a day (QID) | ORAL | Status: DC | PRN

## 2013-08-05 MED ORDER — GABAPENTIN 300 MG PO CAPS: 300.0000 mg | ORAL_CAPSULE | Freq: Three times a day (TID) | ORAL | Status: DC

## 2013-08-05 NOTE — Assessment & Plan Note (Signed)
Patient with h/o elevated TSH (4.6) in 2012. She has no symptoms of hypothyroidism, so this may represent subclinical hypothyroidism. Per chart review, they had intended on checking TSH during her last visit in June 2014, but this was not obtained. Therefore, I will recheck TSH today. Unless TSH is above 10, I will not plan to treat.

## 2013-08-05 NOTE — Progress Notes (Signed)
Patient ID: Domonic Hiscox, female   DOB: 1961-07-29, 52 y.o.   MRN: [id number] HPI The patient is a 52 y.o. female with a history of uncontrolled DM with peripheral neuropathy s/p L great toe amputation 01/2013, HTN, asthma who presents today for a routine follow up visit and med refills.   DM: uncontrolled.  Patient lost her glucometer in the beginning of August 2014, so she has not been checking her blood sugar and did not bring it with her today. Her HbA1c is 9.2% today (up from 9% in June 2014). She is managed on novolog 15U + correction TID as well as lantus 50U qhs and metformin 1000mg  bid, she reports taking these medications as prescribed. She denies any hypoglycemic episodes. She has peripheral neuropathy managed on gabapentin 300mg  tid, but reports this medication had worked well until a month ago when she noticed it started to wear off 5 hours after taking each dose. She would like this problem addressed today and is amenable to increasing the dose of gabapentin. She had her L great toe amputated in march 2014, denies any issues with healing. She was supposed to be set up with podiatry, but she reports no one ever called her about this to set up an appointment. She is followed by an ophthalmologist and is UTD on her retinal exam. She would like the flu shot today. She has seen Norm Parcel in the past.  Left wrist pain:  Patient reports having continued, constant L wrist pain that began last December when she was in a hurry and injured her wrist while carrying luggage. Pain is worse with movement. She notes mild swelling to wrist, but no swelling or pain to other joints in her hands. There is no pain or swelling to R wrist or hand. She went to the urgent care center and was prescribed Tramadol for this pain, which is helping. She takes this about 5 times per week when the pain is severe. She has not had any imaging of her wrist.  Elevated TSH: Patient with h/o of elevated TSH (4.6 in 2012). It  was documented in her chart that this was supposed to be rechecked at her last visit in June 2014, but was not done. She does not have any symptoms of hypothyroidism at this time.   ROS: General: no fevers, chills, changes in weight, changes in appetite Skin: no rash HEENT: no blurry vision, hearing changes, sore throat Pulm: no dyspnea, coughing, wheezing CV: no chest pain, palpitations, shortness of breath Abd: no abdominal pain, nausea/vomiting, diarrhea/constipation GU: no dysuria, hematuria, polyuria Ext: no arthralgias, myalgias Neuro: see HPI  Filed Vitals:   08/05/13 1451  BP: 129/79  Pulse: 77  Temp: 97.4 F (36.3 C)    Physical Exam: General: alert, cooperative, and in no apparent distress HEENT: pupils equal round and reactive to light, vision grossly intact, oropharynx clear and non-erythematous  Neck: supple, no lymphadenopathy Lungs: clear to ascultation bilaterally, normal work of respiration, no wheezes, rales, ronchi Heart: regular rate and rhythm, no murmurs, gallops, or rubs Abdomen: soft, non-tender, non-distended, normal bowel sounds Extremities: s/p amputation of L great toe- well healed; onychomycosis of toenails; no BLE edema; L wrist- decreased AROM 2/2 pain, passive ROM intact, mild edema to wrist, positive Finkelstein sign, point tenderness on L lateral wrist, no erythema or warmth to L wrist Neurologic: alert & oriented X3, cranial nerves II-XII intact, strength grossly intact, sensation intact to light touch  Current Outpatient Prescriptions on File Prior to Visit  Medication Sig Dispense Refill  . [DISCONTINUED] benazepril (LOTENSIN) 10 MG tablet Take 10 mg by mouth daily.      . [DISCONTINUED] insulin regular (NOVOLIN R,HUMULIN R) 100 units/mL injection Inject 0.15 mLs (15 Units total) into the skin 3 (three) times daily before meals.  10 mL  12  . [DISCONTINUED] potassium chloride (K-DUR) 10 MEQ tablet Take 20 mEq by mouth daily.       No current  facility-administered medications on file prior to visit.    Assessment/Plan

## 2013-08-05 NOTE — Assessment & Plan Note (Signed)
BP Readings from Last 3 Encounters:  08/05/13 129/79  06/21/13 138/80  05/13/13 131/74    Lab Results  Component Value Date   NA 140 02/20/2013   K 4.2 02/20/2013   CREATININE 0.48* 02/20/2013    Assessment: Blood pressure control: controlled Progress toward BP goal:  at goal Comments: continue HCTZ, added lisinopril today  Plan: Medications:  continue current medications Educational resources provided: brochure Self management tools provided:   Other plans: none

## 2013-08-05 NOTE — Assessment & Plan Note (Addendum)
Patient's HbA1c is 9.2% today, which is up from her last check in June which was 9.0%. She did not bring glucometer today, so it is difficult to assess how to alter insulin regimen. We increased lantus only today from 50U daily to 55U daily. I also reordered Novolog, lantus, glucometer, test strips. I encouraged her to check her BG at least twice daily once she obtains her meter and she agrees that she will. I instructed patient about signs/symptoms to look for if she becomes hypoglycemic, she reports good understanding. Obtained records from previous provider to further understand past insulin regimens- last regimen she appears to have been on was metformin 1000mg  bid, lantus 35U qhs, and novolog 45U in am and 30U in pm; however her documented A1c on this regiment was 12.5%, so she was not well controlled on this regimen either.  Patient was not on an ACE/ARB, so we added lisinopril 5mg  today to protect her kidneys. Her last urine microalb/Cr ratio was 53.7 in April 2014. Gross kidney function remains wnl.  She reports that the gabapentin is wearing off 5 hours after each dose. Today we increased your gabapentin to 300mg  in the morning and afternoon and 600mg  at night before bed. She will try this for 1-2 weeks. If she tolerates this dose and still has pain, she will increase to 300mg  in the morning, then 600mg  in the afternoon and at bedtime. If she has continued pain and not having increased drowsiness, she can increase to up to 600mg  three times per day. Would like to see patient back in clinic before increase the dose further. Patient was referred to podiatry in the past, but never saw them. May need to put in another referral next visit if she still has not seen them.  We also checked lipid panel today. She received the flu shot.  ADDENDUM 08/06/2013 Lipid panel resulted. Total and LDL cholesterol are elevated. Patient is diabetic and would benefit from statin therapy. Will need to discuss with patient  at her next follow up visit that I would like to start statin therapy.

## 2013-08-05 NOTE — Assessment & Plan Note (Addendum)
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 There is mild swelling of L wrist, but not other joint changes such as redness or warmth. Additionally, there are no other joint changes to her DIP, MCPs, PIPs or her R hand, which makes rheumatoid arthritis much less likely as this is a symmetric process. It is possible that this is OA, but given her history of exact injury followed by continuous pain as well as her positive Finkelstein test and point tenderness over lateral wrist, I am fairly certain this is DT. She has been managed on Tramadol 50mg  q6 prn and she reports good relief of pain. She takes this approx 5 times weekly. At some point she may benefit from a steroid injection if her pain continues. Will continue tramadol for now. Patient can also buddy tape her thumb to the base of the first finger if this provides relief. She may also try doing 5 second stretching in the Bode test position 20 times per day.

## 2013-08-05 NOTE — Patient Instructions (Addendum)
Thank you for your visit today.   Today we increased your gabapentin to 300mg  in the morning and afternoon and 600mg  at night before bed. Try this for 1-2 weeks. If you tolerate this dose and you still have pain, please increase to 300mg  in the morning, then 600mg  in the afternoon and at bedtime. If you have continued pain and are not having increased drowsiness, you can increase to up to 600mg  three times per day.   Insulin: we increased your lantus dose today to 55U each night. Please call the clinic if you start having symptoms of low blood sugar such as tremor, feeling weak or nauseous.   We added lisinopril 5mg  daily to your medication regimen to protect your kidneys.   We checked your TSH to assess your thyroid function.  We also checked your lipid panel.   You received the flu shot today.  Please follow up in three months.

## 2013-08-06 ENCOUNTER — Ambulatory Visit: Payer: Self-pay

## 2013-08-06 LAB — TSH: TSH: 1.22 u[IU]/mL (ref 0.350–4.500)

## 2013-08-06 LAB — LIPID PANEL
HDL: 69 mg/dL (ref >39)
LDL Cholesterol: 128 mg/dL — ABNORMAL HIGH (ref 0–99)

## 2013-08-07 ENCOUNTER — Encounter (HOSPITAL_COMMUNITY): Payer: Self-pay

## 2013-08-07 ENCOUNTER — Emergency Department (HOSPITAL_COMMUNITY)
Admission: EM | Admit: 2013-08-07 | Discharge: 2013-08-07 | Discharge status: Against Medical Advice | Payer: Self-pay | Attending: Emergency Medicine | Admitting: Emergency Medicine

## 2013-08-07 DIAGNOSIS — R5381 Other malaise: Secondary | ICD-10-CM | POA: Insufficient documentation

## 2013-08-07 NOTE — ED Notes (Signed)
Pt states she is feeling better and is going home.  States she had took her potassium just pta and thinks that she had to wait for it to "kick in."  Encouraged pt to stay and pt's daughter states she is leaving.

## 2013-08-07 NOTE — ED Notes (Signed)
Pt. Stated, I was sitting in church and my hands started to cramp and I know I'm going through menopause and I got these hot flashes but not like the normal ones for menopause.  i drank some water. I feel like I've been in a fight.  I feel better the cramping is gone but I still feel weak.

## 2013-08-08 ENCOUNTER — Other Ambulatory Visit: Payer: Self-pay | Admitting: *Deleted

## 2013-08-08 DIAGNOSIS — E119 Type 2 diabetes mellitus without complications: Secondary | ICD-10-CM

## 2013-08-08 NOTE — Telephone Encounter (Signed)
Fax from the pharmacy - states directions are confusing - please clarify  Thanks

## 2013-08-09 NOTE — Telephone Encounter (Signed)
I spoke with the pharmacy and I made the necessary clarifications.

## 2013-08-10 ENCOUNTER — Other Ambulatory Visit: Payer: Self-pay | Admitting: Internal Medicine

## 2013-08-10 MED ORDER — INSULIN ASPART 100 UNIT/ML ~~LOC~~ SOLN: SUBCUTANEOUS | Status: DC

## 2013-08-12 NOTE — Progress Notes (Signed)
I saw and evaluated the patient.  I personally confirmed the key portions of the history and exam documented by Dr. Chikowski and I reviewed pertinent patient test results.  The assessment, diagnosis, and plan were formulated together and I agree with the documentation in the resident's note. 

## 2013-08-26 NOTE — Addendum Note (Signed)
Addended by: Bufford Spikes on: 08/26/2013 02:58 PM   Modules accepted: Orders

## 2013-09-10 ENCOUNTER — Telehealth: Payer: Self-pay | Admitting: *Deleted

## 2013-09-10 NOTE — Telephone Encounter (Signed)
[place] [facility] calls and states that novolog directions are not clear, should the 60 units total include the 15 units per meal? Or is it 15 u per meal with up to 60 units coverage amt? You may speak to pharmacy at [phone] or send back to triage and we will call the specific directions in

## 2013-09-10 NOTE — Telephone Encounter (Signed)
I spoke with Aggie Cosier at [facility] and clarified the insulin regimen. She had no other questions after I spoke with her.

## 2013-09-16 ENCOUNTER — Ambulatory Visit (INDEPENDENT_AMBULATORY_CARE_PROVIDER_SITE_OTHER): Payer: Self-pay | Admitting: Internal Medicine

## 2013-09-16 ENCOUNTER — Telehealth: Payer: Self-pay | Admitting: *Deleted

## 2013-09-16 DIAGNOSIS — I10 Essential (primary) hypertension: Secondary | ICD-10-CM

## 2013-09-16 DIAGNOSIS — E1169 Type 2 diabetes mellitus with other specified complication: Secondary | ICD-10-CM

## 2013-09-16 DIAGNOSIS — E119 Type 2 diabetes mellitus without complications: Secondary | ICD-10-CM

## 2013-09-16 DIAGNOSIS — L97509 Non-pressure chronic ulcer of other part of unspecified foot with unspecified severity: Secondary | ICD-10-CM

## 2013-09-16 DIAGNOSIS — E11621 Type 2 diabetes mellitus with foot ulcer: Secondary | ICD-10-CM | POA: Insufficient documentation

## 2013-09-16 NOTE — Progress Notes (Signed)
[name], female   DOB: 01-11-1961, 52 y.o.   MRN: [id number]  Subjective:   [name] female   DOB: [birth year] 52 y.o.   MRN: [id number]  HPI: [name] is a 52 y.o. F with PMH asthma, HTN, DM2, and a h/o osteomyelitis of the left foot presents for an acute visit to [facility].  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 She had been seen at the wound care center, but states her last appointment was sometime around April or May. 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 She noticed on Sunday a large callus on the ball of her left foot. She states she began to pick at it and removed a portion of the callus and noticed clear fluid draining from the site. She states that she called the wound care center, was told day did not have an available appointment until the end of the month, so she decided to come to the clinic today for evaluation.   She does have a history of diabetes and checks her blood sugar twice a day. She states her blood sugars usually run from 125-220. She states this morning her CBG was 125 fasting. She currently takes Lantus 55 units each bedtime and NovoLog 15 units 3 times a day with meals.   Past Medical History  Diagnosis Date  . Diabetes mellitus   . Hypertension   . Asthma 11/01/2010   Current Outpatient Prescriptions  Medication Sig Dispense Refill  . Blood Glucose Monitoring Suppl (BLOOD GLUCOSE METER) kit Use as instructed  1 each  0  . glucose blood test strip Use to check blood sugar up to 3 times a day before meals Dx code- 250.00  100 each  12  . hydrochlorothiazide (MICROZIDE) 12.5 MG capsule Take 1 capsule (12.5 mg total) by mouth daily.  30 capsule  4  . insulin aspart (NOVOLOG) 100 UNIT/ML injection Inject 15 Units plus correction amount 3 times  daily before meals up to a total of 60 Units  10 mL  3  . Insulin Glargine (LANTUS SOLOSTAR) 100 UNIT/ML SOPN Inject 55 Units into the skin at bedtime.  15 mL  3  . Insulin Syringe-Needle U-100 31G X 5/16" 0.5 ML MISC 1 each by Does not apply route 2 (two) times daily.  100 each  12  . lisinopril (PRINIVIL,ZESTRIL) 5 MG tablet Take 1 tablet (5 mg total) by mouth daily.  30 tablet  11  . metFORMIN (GLUCOPHAGE) 1000 MG tablet Take 1 tablet (1,000 mg total) by mouth 2 (two) times daily with a meal.  60 tablet  4  . omeprazole (PRILOSEC) 20 MG capsule Take 20 mg by mouth daily as needed. Acid reflux      . traMADol (ULTRAM) 50 MG tablet Take 1 tablet (50 mg total) by mouth every 6 (six) hours as needed for pain.  30 tablet  3  . gabapentin (NEURONTIN) 300 MG capsule Take 1 capsule (300 mg total) by mouth 3 (three) times daily. Take 300mg  (1 capsule) in the morning and in the afternoon, then take 600mg  (2 capsules) at night before bed. Can increase to 600mg  three times per day as tolerated.  120 capsule  2  . [DISCONTINUED] benazepril (LOTENSIN) 10 MG tablet Take 10 mg by mouth  daily.      . [DISCONTINUED] insulin regular (NOVOLIN R,HUMULIN R) 100 units/mL injection Inject 0.15 mLs (15 Units total) into the skin 3 (three) times daily before meals.  10 mL  12  . [DISCONTINUED] potassium chloride (K-DUR) 10 MEQ tablet Take 20 mEq by mouth daily.       No current facility-administered medications for this visit.   Family History  Problem Relation Age of Onset  . Diabetes Father   . Diabetes Daughter    History   Social History  . Marital Status: Legally Separated    Spouse Name: N/A    Number of Children: N/A  . Years of Education: N/A   Social History Main Topics  . Smoking status: Never Smoker   . Smokeless tobacco: Not on file  . Alcohol Use: No  . Drug Use: No  . Sexual Activity: Not on file   Other Topics Concern  . Not on file   Social History Narrative  . No narrative on file    Review of Systems: A 10 point ROS was performed; pertinent positives and negatives were noted in the HPI   Objective:  Physical Exam: There were no vitals filed for this visit. Constitutional: Vital signs reviewed.  Patient is a well-developed and well-nourished female in no acute distress and cooperative with exam.   Head: Normocephalic and atraumatic Eyes: PERRL, EOMI, conjunctivae normal, No scleral icterus.  Neck: Supple, Trachea midline normal ROM Cardiovascular: RRR, no MRG, pulses symmetric and intact bilaterally Pulmonary/Chest: normal respiratory effort, CTAB, no wheezes, rales, or rhonchi Abdominal: Soft. Non-tender, non-distended, bowel sounds are normal Musculoskeletal: No joint deformities, erythema, or stiffness, ROM full and no nontender Neurological: A&O x3, Strength is normal and symmetric bilaterally, cranial nerve II-XII are grossly intact, no focal motor deficit, diminished sensation in the left foot to fine touch Skin: Large 3-4cm callus on the plantar surface of the foot, below the 2nd-3th toes. Callus is open in the center, with the base visible, no purulence or drainage noted or able to be expressed from the ulcer/wound. Not malodorous. Foot is nontender, no erythema noted.  Psychiatric: Normal mood and affect. speech and behavior is normal. Judgment and thought content normal. Cognition and memory are normal.   Assessment & Plan:   Please refer to Problem List based Assessment and Plan

## 2013-09-16 NOTE — Patient Instructions (Signed)
**You have an appointment at the wound care center tomorrow (10/21) at 3pm. Please be sure to go to this appointment. This is very important, as you're foot needs to be debrided to remove the dead tissue. If you develop any fevers, chills, nausea or vomiting, or severe pain in her foot, please go to urgent care or emergency department.    Diabetes and Foot Care Diabetes may cause you to have a poor blood supply (circulation) to your legs and feet. Because of this, the skin may be thinner, break easier, and heal more slowly. You also may have nerve damage in your legs and feet causing decreased feeling. You may not notice minor injuries to your feet that could lead to serious problems or infections. Taking care of your feet is one of the most important things you can do for yourself.  HOME CARE INSTRUCTIONS  Do not go barefoot. Bare feet are easily injured.  Check your feet daily for blisters, cuts, and redness.  Wash your feet with warm water (not hot) and mild soap. Pat your feet and between your toes until completely dry.  Apply a moisturizing lotion that does not contain alcohol or petroleum jelly to the dry skin on your feet and to dry brittle toenails. Do not put it between your toes.  Trim your toenails straight across. Do not dig under them or around the cuticle.  Do not cut corns or calluses, or try to remove them with medicine.  Wear clean cotton socks or stockings every day. Make sure they are not too tight. Do not wear knee high stockings since they may decrease blood flow to your legs.  Wear leather shoes that fit properly and have enough cushioning. To break in new shoes, wear them just a few hours a day to avoid injuring your feet.  Wear shoes at all times, even in the house.  Do not cross your legs. This may decrease the blood flow to your feet.  If you find a minor scrape, cut, or break in the skin on your feet, keep it and the skin around it clean and dry. These areas may  be cleansed with mild soap and water. Do not use peroxide, alcohol, iodine or Merthiolate.  When you remove an adhesive bandage, be sure not to harm the skin around it.  If you have a wound, look at it several times a day to make sure it is healing.  Do not use heating pads or hot water bottles. Burns can occur. If you have lost feeling in your feet or legs, you may not know it is happening until it is too late.  Report any cuts, sores or bruises to your caregiver. Do not wait! SEEK MEDICAL CARE IF:   You have an injury that is not healing or you notice redness, numbness, burning, or tingling.  Your feet always feel cold.  You have pain or cramps in your legs and feet. SEEK IMMEDIATE MEDICAL CARE IF:   There is increasing redness, swelling, or increasing pain in the wound.  There is a red line that goes up your leg.  Pus is coming from a wound.  You develop an unexplained oral temperature above 102 F (38.9 C), or as your caregiver suggests.  You notice a bad smell coming from an ulcer or wound. MAKE SURE YOU:   Understand these instructions.  Will watch your condition.  Will get help right away if you are not doing well or get worse. Document Released: 11/11/2000  Document Revised: 02/06/2012 Document Reviewed: 05/20/2009 [facility] Patient Information 2014 [place], [place].

## 2013-09-16 NOTE — Telephone Encounter (Signed)
Agree with appt Thanks 

## 2013-09-16 NOTE — Telephone Encounter (Signed)
Pt calls and states she found a callus on bottom of Lfoot, in "ball" of foot appr 1inch, it is now draining and has blood in the remainder of callus that has not opened. Desires to be seen asap, appt set for 1530 dr Sherrine Maples, pt states it will take over an hour to get to clinic

## 2013-09-16 NOTE — Assessment & Plan Note (Signed)
BP Readings from Last 3 Encounters:  08/07/13 113/52  08/05/13 129/79  06/21/13 138/80    Lab Results  Component Value Date   NA 140 02/20/2013   K 4.2 02/20/2013   CREATININE 0.48* 02/20/2013    Assessment: Blood pressure control: controlled Progress toward BP goal:  at goal Comments: BP well controlled   Plan: Medications:  continue current medications Educational resources provided:   Self management tools provided:

## 2013-09-16 NOTE — Assessment & Plan Note (Signed)
Patient with large callus on the plantar surface of her foot, at the ball of foot. There is an open area in the middle of this callus with some depth to it. No purulence or drainage is coming from the wound at this time, and there is no odor. I am unable to express anything from the site. I called the wound care center, spoke to Onalee Hua, and they were able to take her tomorrow at 3 PM for evaluation and debridement of this ulcer/wound. She was instructed to go to urgent care or the emergency department if she begins having fevers, chills, nausea, or vomiting, as she may becoming septic. However at this time I do not feel it would is infected, so sepsis is less likely.

## 2013-09-16 NOTE — Assessment & Plan Note (Signed)
Lab Results  Component Value Date   HGBA1C 9.2 08/05/2013   HGBA1C 9.0 05/13/2013   HGBA1C 11.5* 02/20/2013     Assessment: Diabetes control: poor control (HgbA1C >9%) Progress toward A1C goal:  unchanged Comments: CBGs vary widely from 125-220s per pt.  Plan: Medications:  continue current medications Home glucose monitoring: Frequency: 3 times a day Timing:   Instruction/counseling given: reminded to bring blood glucose meter & log to each visit, reminded to bring medications to each visit and discussed foot care Educational resources provided:   Self management tools provided:   Other plans: Continue current regimen, and keep f/u with PCP. For foot care, return to wound care clinic on 10/21 at 3pm for debridement of left foot ulcer.

## 2013-09-17 ENCOUNTER — Other Ambulatory Visit (HOSPITAL_BASED_OUTPATIENT_CLINIC_OR_DEPARTMENT_OTHER): Payer: Self-pay | Admitting: General Surgery

## 2013-09-17 ENCOUNTER — Ambulatory Visit (HOSPITAL_COMMUNITY)
Admission: RE | Admit: 2013-09-17 | Discharge: 2013-09-17 | Disposition: A | Discharge status: Home / Self Care | Payer: Self-pay | Source: Ambulatory Visit | Attending: General Surgery | Admitting: General Surgery

## 2013-09-17 ENCOUNTER — Encounter (HOSPITAL_BASED_OUTPATIENT_CLINIC_OR_DEPARTMENT_OTHER): Payer: Self-pay | Attending: General Surgery

## 2013-09-17 DIAGNOSIS — M869 Osteomyelitis, unspecified: Secondary | ICD-10-CM

## 2013-09-17 DIAGNOSIS — S98119A Complete traumatic amputation of unspecified great toe, initial encounter: Secondary | ICD-10-CM | POA: Insufficient documentation

## 2013-09-17 DIAGNOSIS — Z794 Long term (current) use of insulin: Secondary | ICD-10-CM | POA: Insufficient documentation

## 2013-09-17 DIAGNOSIS — L84 Corns and callosities: Secondary | ICD-10-CM | POA: Insufficient documentation

## 2013-09-17 DIAGNOSIS — E1169 Type 2 diabetes mellitus with other specified complication: Secondary | ICD-10-CM | POA: Insufficient documentation

## 2013-09-17 DIAGNOSIS — Z79899 Other long term (current) drug therapy: Secondary | ICD-10-CM | POA: Insufficient documentation

## 2013-09-17 DIAGNOSIS — F40298 Other specified phobia: Secondary | ICD-10-CM | POA: Insufficient documentation

## 2013-09-17 DIAGNOSIS — X58XXXA Exposure to other specified factors, initial encounter: Secondary | ICD-10-CM | POA: Insufficient documentation

## 2013-09-17 DIAGNOSIS — L97409 Non-pressure chronic ulcer of unspecified heel and midfoot with unspecified severity: Secondary | ICD-10-CM | POA: Insufficient documentation

## 2013-09-17 DIAGNOSIS — S8990XA Unspecified injury of unspecified lower leg, initial encounter: Secondary | ICD-10-CM | POA: Insufficient documentation

## 2013-09-17 NOTE — Progress Notes (Signed)
I saw and evaluated the patient.  I personally confirmed the key portions of the history and exam documented by Dr. Glenn and I reviewed pertinent patient test results.  The assessment, diagnosis, and plan were formulated together and I agree with the documentation in the resident's note. 

## 2013-09-18 NOTE — H&P (Signed)
NAMEMarland Kitchen  Debra, Rivera              ACCOUNT NO.:  [id number]  MEDICAL RECORD NO.:  [id number]  LOCATION:  XRAY                         FACILITY:  [facility]  PHYSICIAN:  Joanne Gavel, M.D.        DATE OF BIRTH:  14-Dec-1960  DATE OF ADMISSION:  09/17/2013 DATE OF DISCHARGE:  09/17/2013                             HISTORY & PHYSICAL   CHIEF COMPLAINT:  Sore on the bottom of the left foot.  HISTORY OF PRESENT ILLNESS:  About 10 days ago, the patient had a discharge of fluid, noticed an ulceration on the plantar surface of her left foot.  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 She has diabetes mellitus and has decreased sensation.  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  PAST SURGICAL HISTORY:  Amputation, left great toe and 4 C-sections.  SOCIAL HISTORY:  Cigarettes none.  Alcohol none.  MEDICATIONS:  Neurontin, metformin, hydrochlorothiazide, lisinopril, NovoLog, and Lantus.  ALLERGIES:  Penicillin, aspirin, and ibuprofen.  REVIEW OF SYSTEMS:  Essentially negative say for above.  PHYSICAL EXAMINATION:  VITAL SIGNS:  Temperature 98.4, pulse 90 and regular, respirations 16, blood pressure 145/80.  Glucose is 132. GENERAL APPEARANCE:  A tall, well-developed female in no distress. EYES, EARS, NOSE, THROAT:  Normal. NECK:  Supple. CHEST:  Clear. HEART:  Regular rhythm. EXTREMITIES:  Examination of the left lower extremity reveals ABI is 1.0.  There are good peripheral pulses on the plantar surface of the foot, beneath the second metatarsal head.  There is a 1.0 x 0.6 wound that is 0.4 deep.  After debridement, the wound is 1.0 x 1.0.  Also a great deal of callus was excised.  IMPRESSION:  Diabetic foot ulcer, Wagner II with III.  PLAN OF TREATMENT:  We will get x-rays, rule out osteomyelitis.  We will run the laboratory tests and she is going to be a candidate for offloading with easy cast.  She refuses a notion of  hyperbaric.  She is extremely claustrophobic.     Joanne Gavel, M.D.     RA/MEDQ  D:  09/17/2013  T:  09/18/2013  Job:  [id number]

## 2013-09-23 ENCOUNTER — Encounter (HOSPITAL_BASED_OUTPATIENT_CLINIC_OR_DEPARTMENT_OTHER): Payer: Self-pay

## 2013-10-01 ENCOUNTER — Encounter (HOSPITAL_BASED_OUTPATIENT_CLINIC_OR_DEPARTMENT_OTHER): Payer: Self-pay | Attending: General Surgery

## 2013-10-01 DIAGNOSIS — E1169 Type 2 diabetes mellitus with other specified complication: Secondary | ICD-10-CM | POA: Insufficient documentation

## 2013-10-01 DIAGNOSIS — L97509 Non-pressure chronic ulcer of other part of unspecified foot with unspecified severity: Secondary | ICD-10-CM | POA: Insufficient documentation

## 2013-10-17 ENCOUNTER — Other Ambulatory Visit: Payer: Self-pay | Admitting: *Deleted

## 2013-10-17 DIAGNOSIS — E119 Type 2 diabetes mellitus without complications: Secondary | ICD-10-CM

## 2013-10-17 MED ORDER — GABAPENTIN 300 MG PO CAPS: 300.0000 mg | ORAL_CAPSULE | Freq: Three times a day (TID) | ORAL | Status: DC

## 2013-10-17 NOTE — Telephone Encounter (Signed)
rx faxed in 

## 2013-10-23 ENCOUNTER — Encounter: Payer: Self-pay | Admitting: Internal Medicine

## 2013-10-23 ENCOUNTER — Ambulatory Visit: Payer: Self-pay | Admitting: Internal Medicine

## 2013-10-30 ENCOUNTER — Other Ambulatory Visit: Payer: Self-pay | Admitting: *Deleted

## 2013-10-30 MED ORDER — INSULIN ASPART 100 UNIT/ML ~~LOC~~ SOLN: SUBCUTANEOUS | Status: DC

## 2013-11-01 NOTE — Telephone Encounter (Signed)
Called to pharm 

## 2013-11-13 ENCOUNTER — Other Ambulatory Visit: Payer: Self-pay | Admitting: *Deleted

## 2013-11-13 DIAGNOSIS — E119 Type 2 diabetes mellitus without complications: Secondary | ICD-10-CM

## 2013-11-13 MED ORDER — INSULIN GLARGINE 100 UNIT/ML SOLOSTAR PEN: 55.0000 [IU] | PEN_INJECTOR | Freq: Every day | SUBCUTANEOUS | Status: DC

## 2013-11-15 NOTE — Telephone Encounter (Signed)
Called to pharm 

## 2014-01-06 ENCOUNTER — Encounter: Payer: Self-pay | Admitting: Internal Medicine

## 2014-01-06 ENCOUNTER — Ambulatory Visit (INDEPENDENT_AMBULATORY_CARE_PROVIDER_SITE_OTHER): Payer: Self-pay | Admitting: Internal Medicine

## 2014-01-06 VITALS — BP 143/84 | HR 81 | Temp 97.1°F | Ht 73.0 in | Wt 296.6 lb

## 2014-01-06 DIAGNOSIS — E119 Type 2 diabetes mellitus without complications: Secondary | ICD-10-CM

## 2014-01-06 DIAGNOSIS — Z0000 Encounter for general adult medical examination without abnormal findings: Secondary | ICD-10-CM

## 2014-01-06 DIAGNOSIS — M25539 Pain in unspecified wrist: Secondary | ICD-10-CM

## 2014-01-06 DIAGNOSIS — I10 Essential (primary) hypertension: Secondary | ICD-10-CM

## 2014-01-06 LAB — BASIC METABOLIC PANEL WITH GFR
BUN: 14 mg/dL (ref 6–23)
CALCIUM: 9.4 mg/dL (ref 8.4–10.5)
CO2: 30 mEq/L (ref 19–32)
Chloride: 99 mEq/L (ref 96–112)
Creat: 0.75 mg/dL (ref 0.50–1.10)
GFR, Est Non African American: >89 mL/min
Glucose, Bld: 298 mg/dL — ABNORMAL HIGH (ref 70–99)
POTASSIUM: 4.8 meq/L (ref 3.5–5.3)
SODIUM: 135 meq/L (ref 135–145)

## 2014-01-06 LAB — GLUCOSE, CAPILLARY: Glucose-Capillary: 300 mg/dL — ABNORMAL HIGH (ref 70–99)

## 2014-01-06 LAB — POCT GLYCOSYLATED HEMOGLOBIN (HGB A1C): Hemoglobin A1C: 8.6

## 2014-01-06 MED ORDER — METFORMIN HCL 1000 MG PO TABS: 1000.0000 mg | ORAL_TABLET | Freq: Two times a day (BID) | ORAL | Status: DC

## 2014-01-06 MED ORDER — INSULIN ASPART 100 UNIT/ML ~~LOC~~ SOLN: SUBCUTANEOUS | Status: DC

## 2014-01-06 MED ORDER — LISINOPRIL 10 MG PO TABS: 10.0000 mg | ORAL_TABLET | Freq: Every day | ORAL | Status: DC

## 2014-01-06 MED ORDER — GABAPENTIN 300 MG PO CAPS: 600.0000 mg | ORAL_CAPSULE | Freq: Three times a day (TID) | ORAL | Status: DC

## 2014-01-06 MED ORDER — HYDROCHLOROTHIAZIDE 12.5 MG PO CAPS: 12.5000 mg | ORAL_CAPSULE | Freq: Every day | ORAL | Status: DC

## 2014-01-06 MED ORDER — INSULIN GLARGINE 100 UNIT/ML SOLOSTAR PEN: 60.0000 [IU] | PEN_INJECTOR | Freq: Every day | SUBCUTANEOUS | Status: DC

## 2014-01-06 NOTE — Assessment & Plan Note (Addendum)
BP Readings from Last 3 Encounters:  01/06/14 143/84  08/07/13 113/52  08/05/13 129/79    Lab Results  Component Value Date   NA 140 02/20/2013   K 4.2 02/20/2013   CREATININE 0.48* 02/20/2013    Assessment: Blood pressure control: mildly elevated Progress toward BP goal:  deteriorated Comments:   Plan: Medications:  Will increase lisinopril to 10mg  daily as patient has room to go up on this medication; continue HCTZ 12.5mg  daily; will assess for hypokalemia given symptoms of hand cramping by obtaining BMP today; the increased dose of lisinopril may also help to prevent hypokalemia; I asked patient to STOP taking her potassium supplement for now until we have a K value at which point I will decided whether or not I should put patient on a known dose of potassium Educational resources provided:   Self management tools provided:   Other plans:

## 2014-01-06 NOTE — Patient Instructions (Signed)
Thank you for your visit.  Today we made the following changes to your medications: -increased lisinopril from 5mg  daily to 10mg  daily -STOPPED tramadol -It is OKAY to take MAXIMUM of 4g tylenol in 24hours. You are taking much less than this, but DO NOT take more than 4 grams (4000mg ) in a 24hour period. -your lantus was increased from 55 units each night to 60 units each night -Please STOP taking your potassium supplementation.   I checked your potassium level today. I will call you with the results if they are abnormal.   Please complete the stool cards.  Please follow up with me in 3 months or sooner if needed.

## 2014-01-06 NOTE — Assessment & Plan Note (Signed)
Given patient is not tolerating tramadol, we will stop this medication. Patient has some relief with tylenol and is not interested in starting a narcotic today. Therefore, we will continue tylenol as needed for pain. I instructed patient that she should take a maximum of 4000mg  tylenol in a 24 hour period. I suspect patient will take much less than this as she has been taking 2000mg  once approx 3-4 times per month.

## 2014-01-06 NOTE — Progress Notes (Signed)
[name], female   DOB: 12/04/1960, 53 y.o.   MRN: [id number] HPI The patient is a 53 y.o. female with a history of asthma, HTN, DM2, and a h/o osteomyelitis of the left foot presents for a routine visit.  DM- Patient's last A1c 07/2013 was 9.2%. She is compliant with her metformin 1000 mg twice a day, Lantus 55 units each bedtime, NovoLog 15 units times a day with meals. She checks her CBGs 2 times a day, one fasting in the morning and one at night before bed. She says her morning fasting blood glucose level usually runs in the 150s and her nighttime blood glucose level usually 200-220. She did not bring her glucometer. She denies having any episodes of hypoglycemia. 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 Patient denies having daytime drowsiness. She reports that the pain in her feet is improved from our prior visit together however she still has some residual pain to her bilateral feet.  HTN- Patient's blood pressure today is 143/84. She takes hydrochlorothiazide 12.5 mg daily as well as lisinopril 5 mg daily. She is compliant with these medications. She does not check her blood pressure at home. Patient reports that she has been on hydrochlorothiazide in the past at 25 mg daily and had low potassium levels with hand cramping. She is worried that her potassium is low because she has been having bilateral hand cramping on a daily basis since starting HCTZ. Patient is self-medicating with over-the-counter potassium supplements, however she is unable to tell me how much potassium is in the supplement nor is she able to tell me what else is in the supplement. She does believe the supplement is helping her hand cramping.  Pain- Patient reports having intermittent pain to her left wrist since an injury carrying luggage in 2014. She also has intermittent pain in her left hip. Patient had been managed on tramadol  prn, however she reports that this is causing her nausea and vomiting therefore she's not been taking it recently. Patient requires pain medications only approximately 3 or 4 times a month. She does not think her pain is very severe. She is able to take 4 pills of Tylenol 500 mg about 3 or 4 times a month that seems to help her pain. Unfortunately, patient is allergic to NSAIDs. She does not desire any narcotic at this time.  ROS: General: no fevers, chills, changes in weight, changes in appetite Skin: no rash HEENT: no blurry vision, hearing changes, sore throat Pulm: no dyspnea, coughing, wheezing CV: no chest pain, palpitations, shortness of breath Abd: no abdominal pain, nausea/vomiting, diarrhea/constipation GU: no dysuria, hematuria, polyuria Ext: cramping to b/l hands w/ intermittent L wrist pain and L hip pain; no other myalgia Neuro: pain to b/l feet  Filed Vitals:   01/06/14 1520  BP: 143/84  Pulse: 81  Temp: 97.1 F (36.2 C)   Physical Exam General: alert, cooperative, and in no apparent distress HEENT: pupils equal round and reactive to light, vision grossly intact, oropharynx clear and non-erythematous, MMM Neck: supple Lungs: clear to ascultation bilaterally, normal work of respiration Heart: regular rate and rhythm, no murmurs, gallops, or rubs Abdomen: soft, obese, non-tender, non-distended, normal bowel sounds Extremities: warm extremities, no pedal edema Neurologic: alert & oriented X3, cranial nerves II-XII grossly intact, strength grossly intact, sensation intact to light touch  Current Outpatient Prescriptions on File Prior to Visit  Medication Sig  Dispense Refill  . Blood Glucose Monitoring Suppl (BLOOD GLUCOSE METER) kit Use as instructed  1 each  0  . gabapentin (NEURONTIN) 300 MG capsule Take 1 capsule (300 mg total) by mouth 3 (three) times daily.  90 capsule  2  . glucose blood test strip Use to check blood sugar up to 3 times a day before meals Dx code-  250.00  100 each  12  . hydrochlorothiazide (MICROZIDE) 12.5 MG capsule Take 1 capsule (12.5 mg total) by mouth daily.  30 capsule  4  . insulin aspart (NOVOLOG) 100 UNIT/ML injection Inject 15 Units plus correction amount 3 times daily before meals up to a total of 60 Units  60 mL  4  . Insulin Glargine (LANTUS SOLOSTAR) 100 UNIT/ML SOPN Inject 55 Units into the skin at bedtime.  45 mL  4  . Insulin Syringe-Needle U-100 31G X 5/16" 0.5 ML MISC 1 each by Does not apply route 2 (two) times daily.  100 each  12  . lisinopril (PRINIVIL,ZESTRIL) 5 MG tablet Take 1 tablet (5 mg total) by mouth daily.  30 tablet  11  . metFORMIN (GLUCOPHAGE) 1000 MG tablet Take 1 tablet (1,000 mg total) by mouth 2 (two) times daily with a meal.  60 tablet  4  . omeprazole (PRILOSEC) 20 MG capsule Take 20 mg by mouth daily as needed. Acid reflux      . traMADol (ULTRAM) 50 MG tablet Take 1 tablet (50 mg total) by mouth every 6 (six) hours as needed for pain.  30 tablet  3  . [DISCONTINUED] benazepril (LOTENSIN) 10 MG tablet Take 10 mg by mouth daily.      . [DISCONTINUED] insulin regular (NOVOLIN R,HUMULIN R) 100 units/mL injection Inject 0.15 mLs (15 Units total) into the skin 3 (three) times daily before meals.  10 mL  12  . [DISCONTINUED] potassium chloride (K-DUR) 10 MEQ tablet Take 20 mEq by mouth daily.       No current facility-administered medications on file prior to visit.    Assessment/Plan

## 2014-01-06 NOTE — Assessment & Plan Note (Signed)
Patient received stool cards today.

## 2014-01-06 NOTE — Assessment & Plan Note (Signed)
Lab Results  Component Value Date   HGBA1C 8.6 01/06/2014   HGBA1C 9.2 08/05/2013   HGBA1C 9.0 05/13/2013     Assessment: Diabetes control: fair control Progress toward A1C goal:  improved Comments: Patient's A1c today is improved from prior, though CBGs still running high at home (150s-220s). There have been no symptoms of hypoglycemia.  Plan: Medications:  continue current medications Home glucose monitoring: Frequency: 2 times a day Timing: before breakfast;at bedtime Instruction/counseling given: reminded to get eye exam, reminded to bring blood glucose meter & log to each visit, reminded to bring medications to each visit and discussed the need for weight loss Educational resources provided:   Self management tools provided:   Other plans:  -increase lantus from 55U qHS to 60U qHS. Patient counseled on the signs/symptoms of hypoglycemia. -continue metformin 1000mg  BID as well as Novolog 15U TID w/ meals -continue gabapentin 600mg  TID

## 2014-01-07 ENCOUNTER — Telehealth: Payer: Self-pay | Admitting: Internal Medicine

## 2014-01-07 NOTE — Telephone Encounter (Signed)
I spoke with patient this morning and informed her of the results of her BMP. Her potassium is within normal range. She has continued to take the potassium supplement despite my efforts to discourage her from doing so. I asked that if she was going to continue take this medication that she call the clinic and give [name] the name of the supplement she is taking as well as tell us all the ingredients and how much of each ingredient is in the supplement. Ideally, patient would stop the supplement and we could recheck her potassium level. If her potassium level is low, I can start her on a potassium supplement that I am more familiar with. Patient does not want to pursue this option as she states she will have severe hand cramping if she skips even 1 day of taking the supplement. She is amenable to calling our clinic with the information about the supplement listed above.

## 2014-02-12 ENCOUNTER — Encounter (HOSPITAL_COMMUNITY): Payer: Self-pay | Admitting: Emergency Medicine

## 2014-02-12 DIAGNOSIS — R079 Chest pain, unspecified: Secondary | ICD-10-CM | POA: Insufficient documentation

## 2014-02-12 DIAGNOSIS — I10 Essential (primary) hypertension: Secondary | ICD-10-CM | POA: Insufficient documentation

## 2014-02-12 DIAGNOSIS — E119 Type 2 diabetes mellitus without complications: Secondary | ICD-10-CM | POA: Insufficient documentation

## 2014-02-12 DIAGNOSIS — J45909 Unspecified asthma, uncomplicated: Secondary | ICD-10-CM | POA: Insufficient documentation

## 2014-02-12 LAB — BASIC METABOLIC PANEL
BUN: 13 mg/dL (ref 6–23)
CALCIUM: 9.5 mg/dL (ref 8.4–10.5)
CO2: 26 mEq/L (ref 19–32)
CREATININE: 0.59 mg/dL (ref 0.50–1.10)
Chloride: 101 mEq/L (ref 96–112)
GFR calc Af Amer: >90 mL/min (ref >90)
GLUCOSE: 162 mg/dL — AB (ref 70–99)
Potassium: 4.1 mEq/L (ref 3.7–5.3)
SODIUM: 141 meq/L (ref 137–147)

## 2014-02-12 LAB — CBC
HCT: 37.3 % (ref 36.0–46.0)
HEMOGLOBIN: 12.5 g/dL (ref 12.0–15.0)
MCH: 32.1 pg (ref 26.0–34.0)
MCHC: 33.5 g/dL (ref 30.0–36.0)
MCV: 95.6 fL (ref 78.0–100.0)
Platelets: 273 10*3/uL (ref 150–400)
RBC: 3.9 MIL/uL (ref 3.87–5.11)
RDW: 13 % (ref 11.5–15.5)
WBC: 8.1 10*3/uL (ref 4.0–10.5)

## 2014-02-12 LAB — I-STAT TROPONIN, ED: TROPONIN I, POC: 0 ng/mL (ref 0.00–0.08)

## 2014-02-12 NOTE — ED Notes (Signed)
Pt in c/o left sided chest pain that radiates into her neck, back and down her left arm over the last two days, thought it was reflux but no relief with medications at home

## 2014-02-13 ENCOUNTER — Emergency Department (HOSPITAL_COMMUNITY)
Admission: EM | Admit: 2014-02-13 | Discharge: 2014-02-13 | Discharge status: Against Medical Advice | Payer: Self-pay | Attending: Emergency Medicine | Admitting: Emergency Medicine

## 2014-02-13 NOTE — ED Notes (Signed)
Pt. Voicing frustration with waiting. States "I'm not going to deal with your incompetence".

## 2014-02-13 NOTE — ED Notes (Addendum)
Pt. Walked out of ED with family. Did not check in with this nurse prior to leaving.

## 2014-02-13 NOTE — ED Notes (Signed)
Pt. Called x2 for a room with no answer.

## 2014-02-13 NOTE — ED Notes (Signed)
Pt. Name called for a room, no answer

## 2014-02-13 NOTE — ED Notes (Signed)
Pt. Complaining of wait time. Explained to patient triage process.

## 2014-02-19 ENCOUNTER — Emergency Department (HOSPITAL_COMMUNITY)
Admission: EM | Admit: 2014-02-19 | Discharge: 2014-02-20 | Discharge status: Against Medical Advice | Payer: Self-pay | Attending: Emergency Medicine | Admitting: Emergency Medicine

## 2014-02-19 ENCOUNTER — Encounter (HOSPITAL_COMMUNITY): Payer: Self-pay | Admitting: Emergency Medicine

## 2014-02-19 DIAGNOSIS — I10 Essential (primary) hypertension: Secondary | ICD-10-CM | POA: Insufficient documentation

## 2014-02-19 DIAGNOSIS — S98119A Complete traumatic amputation of unspecified great toe, initial encounter: Secondary | ICD-10-CM | POA: Insufficient documentation

## 2014-02-19 DIAGNOSIS — E119 Type 2 diabetes mellitus without complications: Secondary | ICD-10-CM | POA: Insufficient documentation

## 2014-02-19 DIAGNOSIS — L988 Other specified disorders of the skin and subcutaneous tissue: Secondary | ICD-10-CM | POA: Insufficient documentation

## 2014-02-19 DIAGNOSIS — J45909 Unspecified asthma, uncomplicated: Secondary | ICD-10-CM | POA: Insufficient documentation

## 2014-02-19 DIAGNOSIS — M79609 Pain in unspecified limb: Secondary | ICD-10-CM | POA: Insufficient documentation

## 2014-02-19 LAB — CBG MONITORING, ED: Glucose-Capillary: 168 mg/dL — ABNORMAL HIGH (ref 70–99)

## 2014-02-19 NOTE — ED Notes (Signed)
CBG 168. 

## 2014-02-19 NOTE — ED Notes (Signed)
Pt with small blister to 2nd digit on left toe; pt with hx of amputation to great toe; pt sts some pain today; pt noted blister with some swelling but is not open

## 2014-02-20 ENCOUNTER — Inpatient Hospital Stay (HOSPITAL_COMMUNITY)
Admission: EM | Admit: 2014-02-20 | Discharge: 2014-02-23 | DRG: 603 | Disposition: A | Discharge status: Home / Self Care | Payer: Self-pay | Attending: Internal Medicine | Admitting: Internal Medicine

## 2014-02-20 ENCOUNTER — Emergency Department (HOSPITAL_COMMUNITY): Payer: Self-pay

## 2014-02-20 ENCOUNTER — Encounter (HOSPITAL_COMMUNITY): Payer: Self-pay | Admitting: Emergency Medicine

## 2014-02-20 DIAGNOSIS — L02619 Cutaneous abscess of unspecified foot: Principal | ICD-10-CM | POA: Diagnosis present

## 2014-02-20 DIAGNOSIS — J45909 Unspecified asthma, uncomplicated: Secondary | ICD-10-CM | POA: Diagnosis present

## 2014-02-20 DIAGNOSIS — E1142 Type 2 diabetes mellitus with diabetic polyneuropathy: Secondary | ICD-10-CM | POA: Diagnosis present

## 2014-02-20 DIAGNOSIS — E1169 Type 2 diabetes mellitus with other specified complication: Secondary | ICD-10-CM | POA: Diagnosis present

## 2014-02-20 DIAGNOSIS — E119 Type 2 diabetes mellitus without complications: Secondary | ICD-10-CM | POA: Diagnosis present

## 2014-02-20 DIAGNOSIS — E1149 Type 2 diabetes mellitus with other diabetic neurological complication: Secondary | ICD-10-CM | POA: Diagnosis present

## 2014-02-20 DIAGNOSIS — R739 Hyperglycemia, unspecified: Secondary | ICD-10-CM

## 2014-02-20 DIAGNOSIS — S98139A Complete traumatic amputation of one unspecified lesser toe, initial encounter: Secondary | ICD-10-CM

## 2014-02-20 DIAGNOSIS — Z833 Family history of diabetes mellitus: Secondary | ICD-10-CM

## 2014-02-20 DIAGNOSIS — I10 Essential (primary) hypertension: Secondary | ICD-10-CM | POA: Diagnosis present

## 2014-02-20 DIAGNOSIS — E1159 Type 2 diabetes mellitus with other circulatory complications: Secondary | ICD-10-CM | POA: Diagnosis present

## 2014-02-20 DIAGNOSIS — M869 Osteomyelitis, unspecified: Secondary | ICD-10-CM

## 2014-02-20 DIAGNOSIS — E1140 Type 2 diabetes mellitus with diabetic neuropathy, unspecified: Secondary | ICD-10-CM | POA: Diagnosis present

## 2014-02-20 DIAGNOSIS — I96 Gangrene, not elsewhere classified: Secondary | ICD-10-CM

## 2014-02-20 DIAGNOSIS — L03039 Cellulitis of unspecified toe: Principal | ICD-10-CM | POA: Diagnosis present

## 2014-02-20 DIAGNOSIS — L03032 Cellulitis of left toe: Secondary | ICD-10-CM | POA: Diagnosis present

## 2014-02-20 DIAGNOSIS — I152 Hypertension secondary to endocrine disorders: Secondary | ICD-10-CM | POA: Diagnosis present

## 2014-02-20 DIAGNOSIS — Z794 Long term (current) use of insulin: Secondary | ICD-10-CM

## 2014-02-20 LAB — CBG MONITORING, ED: Glucose-Capillary: 159 mg/dL — ABNORMAL HIGH (ref 70–99)

## 2014-02-20 LAB — COMPREHENSIVE METABOLIC PANEL
ALT: 45 U/L — AB (ref 0–35)
AST: 27 U/L (ref 0–37)
Albumin: 3.6 g/dL (ref 3.5–5.2)
Alkaline Phosphatase: 92 U/L (ref 39–117)
BUN: 13 mg/dL (ref 6–23)
CALCIUM: 9.4 mg/dL (ref 8.4–10.5)
CO2: 29 mEq/L (ref 19–32)
CREATININE: 0.69 mg/dL (ref 0.50–1.10)
Chloride: 102 mEq/L (ref 96–112)
GFR calc Af Amer: >90 mL/min (ref >90)
GFR calc non Af Amer: >90 mL/min (ref >90)
Glucose, Bld: 206 mg/dL — ABNORMAL HIGH (ref 70–99)
Potassium: 4.8 mEq/L (ref 3.7–5.3)
SODIUM: 141 meq/L (ref 137–147)
TOTAL PROTEIN: 7.2 g/dL (ref 6.0–8.3)
Total Bilirubin: <0.2 mg/dL — ABNORMAL LOW (ref 0.3–1.2)

## 2014-02-20 LAB — CBC WITH DIFFERENTIAL/PLATELET
BASOS ABS: 0 10*3/uL (ref 0.0–0.1)
BASOS PCT: 0 % (ref 0–1)
EOS ABS: 0.1 10*3/uL (ref 0.0–0.7)
EOS PCT: 1 % (ref 0–5)
HCT: 38 % (ref 36.0–46.0)
Hemoglobin: 12.6 g/dL (ref 12.0–15.0)
Lymphocytes Relative: 50 % — ABNORMAL HIGH (ref 12–46)
Lymphs Abs: 3.1 10*3/uL (ref 0.7–4.0)
MCH: 32 pg (ref 26.0–34.0)
MCHC: 33.2 g/dL (ref 30.0–36.0)
MCV: 96.4 fL (ref 78.0–100.0)
MONO ABS: 0.2 10*3/uL (ref 0.1–1.0)
Monocytes Relative: 3 % (ref 3–12)
Neutro Abs: 2.9 10*3/uL (ref 1.7–7.7)
Neutrophils Relative %: 46 % (ref 43–77)
PLATELETS: 264 10*3/uL (ref 150–400)
RBC: 3.94 MIL/uL (ref 3.87–5.11)
RDW: 13 % (ref 11.5–15.5)
WBC: 6.3 10*3/uL (ref 4.0–10.5)

## 2014-02-20 LAB — I-STAT CG4 LACTIC ACID, ED: Lactic Acid, Venous: 1.29 mmol/L (ref 0.5–2.2)

## 2014-02-20 MED ORDER — INSULIN ASPART 100 UNIT/ML ~~LOC~~ SOLN: 0.0000 [IU] | Freq: Every day | SUBCUTANEOUS | Status: DC

## 2014-02-20 MED ORDER — INSULIN GLARGINE 100 UNIT/ML SOLOSTAR PEN: 60.0000 [IU] | PEN_INJECTOR | Freq: Every day | SUBCUTANEOUS | Status: DC

## 2014-02-20 MED ORDER — PANTOPRAZOLE SODIUM 40 MG PO TBEC
40.0000 mg | DELAYED_RELEASE_TABLET | Freq: Every day | ORAL | Status: DC
  Administered 2014-02-21 – 2014-02-23 (×3): 40 mg via ORAL
  Filled 2014-02-20 (×3): qty 1

## 2014-02-20 MED ORDER — INSULIN GLARGINE 100 UNIT/ML ~~LOC~~ SOLN
60.0000 [IU] | Freq: Every day | SUBCUTANEOUS | Status: DC
  Administered 2014-02-20 – 2014-02-23 (×4): 60 [IU] via SUBCUTANEOUS
  Filled 2014-02-20 (×4): qty 0.6

## 2014-02-20 MED ORDER — HEPARIN SODIUM (PORCINE) 5000 UNIT/ML IJ SOLN
5000.0000 [IU] | Freq: Three times a day (TID) | INTRAMUSCULAR | Status: DC
  Administered 2014-02-20: 5000 [IU] via SUBCUTANEOUS
  Filled 2014-02-20 (×5): qty 1

## 2014-02-20 MED ORDER — LISINOPRIL 10 MG PO TABS
10.0000 mg | ORAL_TABLET | Freq: Every day | ORAL | Status: DC
  Administered 2014-02-20 – 2014-02-23 (×4): 10 mg via ORAL
  Filled 2014-02-20 (×6): qty 1

## 2014-02-20 MED ORDER — LEVOFLOXACIN IN D5W 750 MG/150ML IV SOLN
750.0000 mg | Freq: Once | INTRAVENOUS | Status: AC
  Administered 2014-02-20: 750 mg via INTRAVENOUS
  Filled 2014-02-20: qty 150

## 2014-02-20 MED ORDER — INSULIN ASPART 100 UNIT/ML ~~LOC~~ SOLN: 0.0000 [IU] | Freq: Three times a day (TID) | SUBCUTANEOUS | Status: DC

## 2014-02-20 MED ORDER — VANCOMYCIN HCL IN DEXTROSE 1-5 GM/200ML-% IV SOLN
1000.0000 mg | Freq: Once | INTRAVENOUS | Status: AC
  Administered 2014-02-20: 1000 mg via INTRAVENOUS
  Filled 2014-02-20: qty 200

## 2014-02-20 MED ORDER — SODIUM CHLORIDE 0.9 % IJ SOLN: 3.0000 mL | INTRAMUSCULAR | Status: DC | PRN

## 2014-02-20 MED ORDER — SODIUM CHLORIDE 0.9 % IV BOLUS (SEPSIS)
1000.0000 mL | Freq: Once | INTRAVENOUS | Status: AC
  Administered 2014-02-20: 1000 mL via INTRAVENOUS

## 2014-02-20 MED ORDER — HYDROCHLOROTHIAZIDE 12.5 MG PO CAPS
12.5000 mg | ORAL_CAPSULE | Freq: Every day | ORAL | Status: DC
  Administered 2014-02-21 – 2014-02-23 (×3): 12.5 mg via ORAL
  Filled 2014-02-20 (×3): qty 1

## 2014-02-20 MED ORDER — SODIUM CHLORIDE 0.9 % IJ SOLN
3.0000 mL | Freq: Two times a day (BID) | INTRAMUSCULAR | Status: DC
  Administered 2014-02-22 – 2014-02-23 (×3): 3 mL via INTRAVENOUS

## 2014-02-20 MED ORDER — GABAPENTIN 300 MG PO CAPS
600.0000 mg | ORAL_CAPSULE | Freq: Three times a day (TID) | ORAL | Status: DC
  Administered 2014-02-20 – 2014-02-23 (×10): 600 mg via ORAL
  Filled 2014-02-20 (×11): qty 2

## 2014-02-20 MED ORDER — SODIUM CHLORIDE 0.9 % IV SOLN: 250.0000 mL | INTRAVENOUS | Status: DC | PRN

## 2014-02-20 MED ORDER — ADULT MULTIVITAMIN W/MINERALS CH
1.0000 | ORAL_TABLET | Freq: Every day | ORAL | Status: DC
  Administered 2014-02-21 – 2014-02-23 (×3): 1 via ORAL
  Filled 2014-02-20 (×3): qty 1

## 2014-02-20 NOTE — ED Notes (Signed)
NOTIFIED R.N. ON POD C ROOM 27 OF PATIENTS LAB RESULTS OF CG4+ LACTIC ACID ,02/20/2014.

## 2014-02-20 NOTE — Progress Notes (Addendum)
ANTIBIOTIC CONSULT NOTE - INITIAL  Pharmacy Consult for Levaquin, Vancomycin  Indication: Cellulitis of 2nd Left toe w/ possible abscess  Allergies  Allergen Reactions  . Aspirin Nausea And Vomiting  . Ibuprofen Nausea And Vomiting and Other (See Comments)    Abdominal pain  . Penicillins Other (See Comments)    unknown    Patient Measurements: Height: 6\' 1"  (185.4 cm) Weight: 296 lb (134.265 kg) IBW/kg (Calculated) : 75.4 Adjusted Body Weight: n/a  Vital Signs: Temp: 98.3 F (36.8 C) (03/26 1915) Temp src: Oral (03/26 1915) BP: 109/79 mmHg (03/26 1915) Pulse Rate: 80 (03/26 1915) Intake/Output from previous day:   Intake/Output from this shift:    Labs:  Recent Labs  02/20/14 1644  WBC 6.3  HGB 12.6  PLT 264  CREATININE 0.69   Estimated Creatinine Clearance: 130 ml/min (by C-G formula based on Cr of 0.69). No results found for this basename: VANCOTROUGH, VANCOPEAK, VANCORANDOM, GENTTROUGH, GENTPEAK, GENTRANDOM, TOBRATROUGH, TOBRAPEAK, TOBRARND, AMIKACINPEAK, AMIKACINTROU, AMIKACIN,  in the last 72 hours   Microbiology: No results found for this or any previous visit (from the past 720 hour(s)).  Medical History: Past Medical History  Diagnosis Date  . Diabetes mellitus   . Hypertension   . Asthma 11/01/2010    Medications:   (Not in a hospital admission) Assessment: 71 YOF presents to ED with aching and color change in the 2nd left toe. She has h/o of extensive peripheral neuropathy and DM type II. H/o great toe amputation. XR of left foot in ED showed no obvious evidence of osteomyelitis. One dose of Vancomycin and Levaquin ordered in ED. Pharmacy to continue both antibiotics. Plan for possible I&D vs. Amputation tomorrow morning. WBC wnl, afebrile, SCr 0.69, CrCl > 100 mL/min.   Goal of Therapy:  Vancomycin trough level 10-15 mcg/ml  Plan:  1) Start Vancomycin 1250 mg IV Q 12 hours  2) Start Levaquin 500 mg IV Q 24 hours  3) Monitor CBC, renal fx,  and patient's clinical progress 4) F/u ortho recommendations  5) Order Vanc trough as necessary   Albertina Parr, PharmD.  Clinical Pharmacist Pager [phone]

## 2014-02-20 NOTE — H&P (Signed)
Date: 02/21/2014               Patient Name:  [name] MRN: [id number]  DOB: 1961-01-04 Age / Sex: 53 y.o., female   PCP: Rebecca Eaton, MD         Medical Service: Internal Medicine Teaching Service         Attending Physician: Dr. Madilyn Fireman, MD    First Contact: Dr. Stann Mainland Pager: [phone]  Second Contact: Dr. Algis Liming Pager: [phone]       After Hours (After 5p/  First Contact Pager: [phone]  weekends / holidays): Second Contact Pager: [phone]   Chief Complaint: Left toe pain  History of Present Illness: [name] is a 53 y.o. female w/ PMHx of DM type II w/ peripheral neuropathy (HbA1c 8.6), HTN, Asthma, and previous left great toe amputation, presents to the ED w/ complaints of left second toe pain and swelling which started yesterday. Patient describes the pain as a mild, dull, achy pain, worse w/ ambulation, mostly present on the dorsum of the toe. She also noted a dark color change w/ a mild swelling. She claims since she had her right great toe amputated in 01/2013, she has been very careful about taking care of her feet and noticed a significant difference in this toe yesterday. She denies discharge, foul smell, and change in temperature. Denies fever or chills, nausea, or vomiting.  On arrival to the ED, patient noted to have stable vitals, no leukocytosis, and XR negative for cellulitis.   Meds: Current Facility-Administered Medications  Medication Dose Route Frequency Provider Last Rate Last Dose  . 0.9 %  sodium chloride infusion  250 mL Intravenous PRN Na Li, MD      . gabapentin (NEURONTIN) capsule 600 mg  600 mg Oral TID Na Li, MD   600 mg at 02/20/14 2258  . hydrochlorothiazide (MICROZIDE) capsule 12.5 mg  12.5 mg Oral Daily Na Li, MD      . insulin aspart (novoLOG) injection 0-15 Units  0-15 Units Subcutaneous TID WC Corky Sox, MD      . insulin glargine (LANTUS) injection 60 Units  60 Units Subcutaneous QHS Madilyn Fireman, MD   60 Units at  02/20/14 2258  . lisinopril (PRINIVIL,ZESTRIL) tablet 10 mg  10 mg Oral QHS Na Li, MD   10 mg at 02/20/14 2257  . multivitamin with minerals tablet 1 tablet  1 tablet Oral Daily Na Li, MD      . pantoprazole (PROTONIX) EC tablet 40 mg  40 mg Oral Daily Na Li, MD      . sodium chloride 0.9 % injection 3 mL  3 mL Intravenous Q12H Na Li, MD      . sodium chloride 0.9 % injection 3 mL  3 mL Intravenous PRN Na Li, MD       Medications Prior to Admission  Medication Sig Dispense Refill  . acetaminophen (TYLENOL) 500 MG tablet Take 1,000 mg by mouth every 6 (six) hours as needed for moderate pain.      Marland Kitchen gabapentin (NEURONTIN) 300 MG capsule Take 2 capsules (600 mg total) by mouth 3 (three) times daily.  180 capsule  3  . hydrochlorothiazide (MICROZIDE) 12.5 MG capsule Take 1 capsule (12.5 mg total) by mouth daily.  30 capsule  4  . insulin aspart (NOVOLOG) 100 UNIT/ML injection Inject 15 Units plus correction amount 3 times daily before meals up to a total of 60 Units  60 mL  4  .  Insulin Glargine (LANTUS SOLOSTAR) 100 UNIT/ML Solostar Pen Inject 60 Units into the skin at bedtime.  45 mL  4  . lisinopril (PRINIVIL,ZESTRIL) 10 MG tablet Take 10 mg by mouth at bedtime.      . metFORMIN (GLUCOPHAGE) 1000 MG tablet Take 1 tablet (1,000 mg total) by mouth 2 (two) times daily with a meal.  60 tablet  4  . Multiple Vitamin (MULTIVITAMIN WITH MINERALS) TABS tablet Take 1 tablet by mouth daily.      Marland Kitchen omeprazole (PRILOSEC) 20 MG capsule Take 20 mg by mouth at bedtime. Acid reflux       Allergies: Allergies as of 02/20/2014 - Review Complete 02/20/2014  Allergen Reaction Noted  . Aspirin Nausea And Vomiting 12/03/2010  . Ibuprofen Nausea And Vomiting and Other (See Comments) 11/01/2010  . Penicillins Other (See Comments) 11/01/2010   Past Medical History  Diagnosis Date  . Diabetes mellitus   . Hypertension   . Asthma 11/01/2010   Past Surgical History  Procedure Laterality Date  . Amputation Left  02/19/2013    Procedure: Amputation of Left Great Toe;  Surgeon: Mcarthur Rossetti, MD;  Location: [facility];  Service: Orthopedics;  Laterality: Left;  . Ablation     Family History  Problem Relation Age of Onset  . Diabetes Father   . Diabetes Daughter    History   Social History  . Marital Status: Legally Separated    Spouse Name: N/A    Number of Children: N/A  . Years of Education: N/A   Occupational History  . Not on file.   Social History Main Topics  . Smoking status: Never Smoker   . Smokeless tobacco: Not on file  . Alcohol Use: No  . Drug Use: No  . Sexual Activity: Not on file   Other Topics Concern  . Not on file   Social History Narrative  . No narrative on file    Review of Systems: General: Denies fever, chills, diaphoresis, appetite change and fatigue.  Respiratory: Denies SOB, DOE, cough, chest tightness, and wheezing.   Cardiovascular: Denies chest pain and palpitations.  Gastrointestinal: Denies nausea, vomiting, abdominal pain, diarrhea, constipation, blood in stool and abdominal distention.  Genitourinary: Denies dysuria, urgency, frequency, hematuria, and flank pain. Endocrine: Denies hot or cold intolerance, polyuria, and polydipsia. Musculoskeletal: Positive for left 2nd toe pain. Denies myalgias, back pain, joint swelling, arthralgias and gait problem.  Skin: Denies pallor, rash and wounds.  Neurological: Positive for chronic LE neuropathy. Denies dizziness, seizures, syncope, weakness, lightheadedness, and headaches.  Psychiatric/Behavioral: Denies mood changes, confusion, nervousness, sleep disturbance and agitation.  Physical Exam: Filed Vitals:   02/20/14 1648 02/20/14 1915 02/20/14 2208 02/21/14 0533  BP: 115/60 109/79 140/73 109/47  Pulse: 89 80 84 78  Temp: 98.6 F (37 C) 98.3 F (36.8 C) 98 F (36.7 C) 97.5 F (36.4 C)  TempSrc:  Oral Oral Oral  Resp: 18 17 18 18   Height:      Weight:      SpO2: 98% 100% 97% 100%  General:  Vital signs reviewed.  Patient is a well-developed and well-nourished, in no acute distress and cooperative with exam.  Head: Normocephalic and atraumatic. Eyes: PERRL, EOMI, conjunctivae normal, No scleral icterus.  Neck: Supple, trachea midline, normal ROM, No JVD, masses, thyromegaly, or carotid bruit present.  Cardiovascular: RRR, S1 normal, S2 normal, no murmurs, gallops, or rubs. Pulmonary/Chest: Air entry equal bilaterally, no wheezes, rales, or rhonchi. Abdominal: Soft, non-tender, non-distended, BS +, no  masses, organomegaly, or guarding present.  Musculoskeletal: No joint deformities, erythema, or stiffness, ROM full and nontender. Extremities: No edema,  pulses symmetric and intact bilaterally. No cyanosis or clubbing. Right great toe amputation. 2nd right toe w/ dark appearance and swelling, some skin changes on dorsum of the toe as well as the tip. Mild fluctuance noted in the distal portion of the toe. No discharge or erythema noted.  Neurological: A&O x3, Strength is normal and symmetric bilaterally, cranial nerve II-XII are grossly intact, no focal motor deficit, sensory intact to light touch bilaterally.  Skin: Warm, dry and intact. No rashes or erythema. Right 2nd toe skin changes as above. Psychiatric: Normal mood and affect. speech and behavior is normal. Cognition and memory are normal.   Lab results: Basic Metabolic Panel:  Recent Labs  02/20/14 1644  NA 141  K 4.8  CL 102  CO2 29  GLUCOSE 206*  BUN 13  CREATININE 0.69  CALCIUM 9.4   Liver Function Tests:  Recent Labs  02/20/14 1644  AST 27  ALT 45*  ALKPHOS 92  BILITOT <0.2*  PROT 7.2  ALBUMIN 3.6   CBC:  Recent Labs  02/20/14 1644 02/21/14 0539  WBC 6.3 5.4  NEUTROABS 2.9  --   HGB 12.6 11.8*  HCT 38.0 36.0  MCV 96.4 96.5  PLT 264 236   CBG:  Recent Labs  02/19/14 1857 02/20/14 2131 02/21/14 0635  GLUCAP 168* 159* 207*   Imaging results:  Dg Toe 2nd Left  02/20/2014   CLINICAL  DATA:  Blister on left second toe possibly with a diabetic ulcer  EXAM: LEFT SECOND TOE  COMPARISON:  DG FOOT COMPLETE*L* dated 09/17/2013  FINDINGS: The bones of the second digit appear intact. There is no lytic or blastic lesion or periosteal reaction. There is mild soft tissue swelling of the digit especially distally. No soft tissue gas collections are demonstrated. The interphalangeal joints appear normal.  IMPRESSION: There is no plain film evidence of osteomyelitis. Soft tissue swelling may reflect cellulitis.   Electronically Signed   By: David  Martinique   On: 02/20/2014 17:21   Other results: EKG: none  Assessment & Plan by Problem:  [name] is a 53 y.o. female w/ PMHx of DM type II w/ peripheral neuropathy (HbA1c 8.6), HTN, Asthma, and previous left great toe amputation, admitted for 2nd left toe cellulitis w/ possible abscess.  Cellulitis of 2nd Left Toe w/ Possible Abscess- Presented w/ aching and color change in the toe, h/o extensive peripheral neuropathy 2/2 DM type II. Patient w/ previous h/o great toe amputation. No fever, chills, nausea, or vomiting. No leukocytosis, lactic acid 1.29. XR of the left foot performed in the ED showed no obvious evidence of osteomyelitis, but w/ soft tissue swelling consistent w/ cellulitis. Patient w/ 2+ distal pulses and sensation intact in distal extremities. Some fluctuance noted over the distal portion of the toe raising the question if an abscess is present. Started on Vancomycin + Levaquin in the ED as well as 1L NS. -Admit to med-surg -Continue ABx; Vancomycin + Levaquin. Narrow as appropriate -Blood cultures -ABI pending  -Ortho consulted, recs pending -Repeat CBC in AM  DM type II- Most recent HbA1c in 01/06/2014 of 8.6. On Lantus 60 units + Novolog 15 units tid, as well as Metformin 1000 mg bid. Patient also w/ significant peripheral neuropathy, on Neurontin 600 mg tid. -Continue home Lantus + ISS (moderate)  HTN- Normotensive on  admission. On HCTZ 12.5 po qd +  Lisinopril 10 mg po qhs.  -Continue home meds  Asthma- No issues.  DVT/PE PPx- Heparin [place]  Dispo: Disposition is deferred at this time, awaiting improvement of current medical problems. Anticipated discharge in approximately 2-3 day(s).   The patient does have a current PCP Rebecca Eaton, MD) and does need an [facility] hospital follow-up appointment after discharge.  The patient does not have transportation limitations that hinder transportation to clinic appointments.  Signed: Corky Sox, MD 02/21/2014, 6:50 AM

## 2014-02-20 NOTE — ED Notes (Signed)
No answer, not in w/r, h/w, b/r, xray, triage, entry way. Unable to find. 3rd call.

## 2014-02-20 NOTE — ED Provider Notes (Signed)
CSN: [id number]     Arrival date & time 02/20/14  1422 History   First MD Initiated Contact with Patient 02/20/14 1647     Chief Complaint  Patient presents with  . Toe Pain     (Consider location/radiation/quality/duration/timing/severity/associated sxs/prior Treatment) Patient is a 53 y.o. female presenting with toe pain.  Toe Pain   53 yo female presents with Left second toe swelling and pain. PMH significant for Diabetes, HTN, and Amputation of LEft Great toe. Patient states pain and swelling started a couple days ago. Pain described as achy and worse with ambulation. Patient has diminished sensation to feet. Patient states toe has started to change color in the past 24 hours. Patient denies fever/chills, dizziness. Patient takes Neurontin for neuropathy sxs. States she does not have health insurance and has not been able to see a podiatrist.  Past Medical History  Diagnosis Date  . Diabetes mellitus   . Hypertension   . Asthma 11/01/2010   Past Surgical History  Procedure Laterality Date  . Amputation Left 02/19/2013    Procedure: Amputation of Left Great Toe;  Surgeon: Mcarthur Rossetti, MD;  Location: [facility];  Service: Orthopedics;  Laterality: Left;  . Ablation     Family History  Problem Relation Age of Onset  . Diabetes Father   . Diabetes Daughter    History  Substance Use Topics  . Smoking status: Never Smoker   . Smokeless tobacco: Not on file  . Alcohol Use: No   OB History   Grav Para Term Preterm Abortions TAB SAB Ect Mult Living                 Review of Systems  All other systems reviewed and are negative.      Allergies  Aspirin; Ibuprofen; and Penicillins  Home Medications   Current Outpatient Rx  Name  Route  Sig  Dispense  Refill  . acetaminophen (TYLENOL) 500 MG tablet   Oral   Take 1,000 mg by mouth every 6 (six) hours as needed for moderate pain.         Marland Kitchen gabapentin (NEURONTIN) 300 MG capsule   Oral   Take 2 capsules (600 mg  total) by mouth 3 (three) times daily.   180 capsule   3     Take one tablet daily x 1 day. Take one tablet twi ...   . hydrochlorothiazide (MICROZIDE) 12.5 MG capsule   Oral   Take 1 capsule (12.5 mg total) by mouth daily.   30 capsule   4   . insulin aspart (NOVOLOG) 100 UNIT/ML injection      Inject 15 Units plus correction amount 3 times daily before meals up to a total of 60 Units   60 mL   4   . Insulin Glargine (LANTUS SOLOSTAR) 100 UNIT/ML Solostar Pen   Subcutaneous   Inject 60 Units into the skin at bedtime.   45 mL   4   . lisinopril (PRINIVIL,ZESTRIL) 10 MG tablet   Oral   Take 10 mg by mouth at bedtime.         . metFORMIN (GLUCOPHAGE) 1000 MG tablet   Oral   Take 1 tablet (1,000 mg total) by mouth 2 (two) times daily with a meal.   60 tablet   4   . Multiple Vitamin (MULTIVITAMIN WITH MINERALS) TABS tablet   Oral   Take 1 tablet by mouth daily.         Marland Kitchen omeprazole (  PRILOSEC) 20 MG capsule   Oral   Take 20 mg by mouth at bedtime. Acid reflux          BP 109/79  Pulse 80  Temp(Src) 98.3 F (36.8 C) (Oral)  Resp 17  Ht 6\' 1"  (1.854 m)  Wt 296 lb (134.265 kg)  BMI 39.06 kg/m2  SpO2 100% Physical Exam  Nursing note and vitals reviewed. Constitutional: She is oriented to person, place, and time. She appears well-developed and well-nourished. No distress.  HENT:  Head: Normocephalic and atraumatic.  Eyes: Conjunctivae are normal.  Neck: No JVD present. No tracheal deviation present.  Cardiovascular: Normal rate and regular rhythm.  Exam reveals no gallop and no friction rub.   No murmur heard. Pulmonary/Chest: Effort normal. No respiratory distress. She has no wheezes. She has no rhonchi. She has no rales.  Musculoskeletal: Normal range of motion. She exhibits no edema.       Feet:  LEFT great toe amputation. LEFT second toe swollen and gangrenous in appearance with deep purple/black discoloration. Extending erythema and edema extends  across dorsum of foot to the ankle. Tenderness to palpation.   Neurological: She is alert and oriented to person, place, and time.  Skin: Skin is warm and dry. She is not diaphoretic.  Psychiatric: She has a normal mood and affect. Her behavior is normal.    ED Course  Procedures (including critical care time) Labs Review Labs Reviewed  CBC WITH DIFFERENTIAL - Abnormal; Notable for the following:    Lymphocytes Relative 50 (*)    All other components within normal limits  COMPREHENSIVE METABOLIC PANEL - Abnormal; Notable for the following:    Glucose, Bld 206 (*)    ALT 45 (*)    Total Bilirubin <0.2 (*)    All other components within normal limits  I-STAT CG4 LACTIC ACID, ED   Imaging Review Dg Toe 2nd Left  02/20/2014   CLINICAL DATA:  Blister on left second toe possibly with a diabetic ulcer  EXAM: LEFT SECOND TOE  COMPARISON:  DG FOOT COMPLETE*L* dated 09/17/2013  FINDINGS: The bones of the second digit appear intact. There is no lytic or blastic lesion or periosteal reaction. There is mild soft tissue swelling of the digit especially distally. No soft tissue gas collections are demonstrated. The interphalangeal joints appear normal.  IMPRESSION: There is no plain film evidence of osteomyelitis. Soft tissue swelling may reflect cellulitis.   Electronically Signed   By: David  Martinique   On: 02/20/2014 17:21     EKG Interpretation None      MDM   Final diagnoses:  Cellulitis, toe  Hyperglycemia   Patient afebrile without leukocytosis.  Hyperglycemia at 206, appears to be stable compared to priors Lactic acid WNL Plain films of affected toe, shows no evidence of osteomyelitis. Soft tissue swelling suggestive of cellulitis.  Patient discussed with Dr. Darl Householder. Plan to admit patient for IV antibiotics and consult ortho surgery. IV anitbiotics started in ED. Discussed plan with patient.  Meds given in ED:  Medications  sodium chloride 0.9 % bolus 1,000 mL (1,000 mLs Intravenous  New Bag/Given 02/20/14 1841)  vancomycin (VANCOCIN) IVPB 1000 mg/200 mL premix (0 mg Intravenous Stopped 02/20/14 1948)  levofloxacin (LEVAQUIN) IVPB 750 mg (750 mg Intravenous New Bag/Given 02/20/14 1948)    New Prescriptions   No medications on file      Sherrie George, [place] 02/20/14 2208

## 2014-02-20 NOTE — ED Notes (Signed)
Additional family members came back to room when waiting room door opened after already being informed of 2 person visitation policy. Family informed again that only 2 visitors were allowed back at a time and that they would have to trade out. Also informed of risk of having young kids in the ER.

## 2014-02-20 NOTE — ED Notes (Signed)
Pt reports noticing pain and swelling to left second toe yesterday, has hx of ambulation of left big toe due to diabetes. And having swelling to foot. Ambulatory at triage.

## 2014-02-20 NOTE — ED Notes (Addendum)
Pt here with swelling, tenderness to 2nd toe on left foot. Pt had large toe amputation last year and states that 2nd toe has been swollen ever since. States that she watches feet regularly for s/s infection and pain yesterday concerned her as well as an increase to "blister" on interior aspect of toe. Pt reports full sensation to toe, denies any pain at this time. NAD.  Toe is swollen. Poor circulation noted. Cool to touch. Denies pain.

## 2014-02-21 LAB — GLUCOSE, CAPILLARY
GLUCOSE-CAPILLARY: 217 mg/dL — AB (ref 70–99)
Glucose-Capillary: 207 mg/dL — ABNORMAL HIGH (ref 70–99)
Glucose-Capillary: 216 mg/dL — ABNORMAL HIGH (ref 70–99)
Glucose-Capillary: 257 mg/dL — ABNORMAL HIGH (ref 70–99)

## 2014-02-21 LAB — CBC
HCT: 36 % (ref 36.0–46.0)
HEMOGLOBIN: 11.8 g/dL — AB (ref 12.0–15.0)
MCH: 31.6 pg (ref 26.0–34.0)
MCHC: 32.8 g/dL (ref 30.0–36.0)
MCV: 96.5 fL (ref 78.0–100.0)
PLATELETS: 236 10*3/uL (ref 150–400)
RBC: 3.73 MIL/uL — ABNORMAL LOW (ref 3.87–5.11)
RDW: 13 % (ref 11.5–15.5)
WBC: 5.4 10*3/uL (ref 4.0–10.5)

## 2014-02-21 LAB — BASIC METABOLIC PANEL
BUN: 14 mg/dL (ref 6–23)
CALCIUM: 9.3 mg/dL (ref 8.4–10.5)
CO2: 28 mEq/L (ref 19–32)
CREATININE: 0.65 mg/dL (ref 0.50–1.10)
Chloride: 103 mEq/L (ref 96–112)
Glucose, Bld: 237 mg/dL — ABNORMAL HIGH (ref 70–99)
Potassium: 4.5 mEq/L (ref 3.7–5.3)
Sodium: 141 mEq/L (ref 137–147)

## 2014-02-21 MED ORDER — LEVOFLOXACIN IN D5W 500 MG/100ML IV SOLN
500.0000 mg | INTRAVENOUS | Status: DC
  Administered 2014-02-21: 500 mg via INTRAVENOUS
  Filled 2014-02-21 (×2): qty 100

## 2014-02-21 MED ORDER — HEPARIN SODIUM (PORCINE) 5000 UNIT/ML IJ SOLN
5000.0000 [IU] | Freq: Three times a day (TID) | INTRAMUSCULAR | Status: DC
  Administered 2014-02-21 – 2014-02-23 (×8): 5000 [IU] via SUBCUTANEOUS
  Filled 2014-02-21 (×10): qty 1

## 2014-02-21 MED ORDER — INSULIN ASPART 100 UNIT/ML ~~LOC~~ SOLN
0.0000 [IU] | Freq: Three times a day (TID) | SUBCUTANEOUS | Status: DC
  Administered 2014-02-21 (×3): 5 [IU] via SUBCUTANEOUS
  Administered 2014-02-22: 3 [IU] via SUBCUTANEOUS
  Administered 2014-02-22 (×2): 5 [IU] via SUBCUTANEOUS
  Administered 2014-02-23: 3 [IU] via SUBCUTANEOUS
  Administered 2014-02-23: 8 [IU] via SUBCUTANEOUS
  Administered 2014-02-23: 5 [IU] via SUBCUTANEOUS

## 2014-02-21 MED ORDER — VANCOMYCIN HCL 10 G IV SOLR
1250.0000 mg | Freq: Two times a day (BID) | INTRAVENOUS | Status: DC
  Administered 2014-02-21 – 2014-02-22 (×3): 1250 mg via INTRAVENOUS
  Filled 2014-02-21 (×4): qty 1250

## 2014-02-21 MED ORDER — INSULIN ASPART 100 UNIT/ML ~~LOC~~ SOLN
6.0000 [IU] | Freq: Three times a day (TID) | SUBCUTANEOUS | Status: DC
  Administered 2014-02-21 – 2014-02-22 (×2): 6 [IU] via SUBCUTANEOUS

## 2014-02-21 NOTE — Progress Notes (Signed)
Subjective: [name] is doing well this morning, no complaints.   Objective: Vital signs in last 24 hours: Filed Vitals:   02/20/14 1648 02/20/14 1915 02/20/14 2208 02/21/14 0533  BP: 115/60 109/79 140/73 109/47  Pulse: 89 80 84 78  Temp: 98.6 F (37 C) 98.3 F (36.8 C) 98 F (36.7 C) 97.5 F (36.4 C)  TempSrc:  Oral Oral Oral  Resp: 18 17 18 18   Height:      Weight:      SpO2: 98% 100% 97% 100%   Weight change:   Intake/Output Summary (Last 24 hours) at 02/21/14 0731 Last data filed at 02/20/14 2300  Gross per 24 hour  Intake    360 ml  Output      0 ml  Net    360 ml   PEX General: alert, cooperative, and in no apparent distress HEENT: NCAT, vision grossly intact, oropharynx clear and non-erythematous  Neck: supple, no lymphadenopathy Lungs: clear to ascultation bilaterally, normal work of respiration, no wheezes, rales, ronchi Heart: regular rate and rhythm, no murmurs, gallops, or rubs Abdomen: soft, non-tender, non-distended, normal bowel sounds Extremities: s/p left great toe amputation, 2nd toe with dark lesion and some swelling, no discharge or erythema noted; 2+ DP/PT pulses bilaterally, no cyanosis, clubbing, or edema Neurologic: alert & oriented X3, cranial nerves II-XII intact, strength grossly intact, sensation intact to light touch  Lab Results: Basic Metabolic Panel:  Recent Labs Lab 02/20/14 1644 02/21/14 0539  NA 141 141  K 4.8 4.5  CL 102 103  CO2 29 28  GLUCOSE 206* 237*  BUN 13 14  CREATININE 0.69 0.65  CALCIUM 9.4 9.3   Liver Function Tests:  Recent Labs Lab 02/20/14 1644  AST 27  ALT 45*  ALKPHOS 92  BILITOT <0.2*  PROT 7.2  ALBUMIN 3.6   CBC:  Recent Labs Lab 02/20/14 1644 02/21/14 0539  WBC 6.3 5.4  NEUTROABS 2.9  --   HGB 12.6 11.8*  HCT 38.0 36.0  MCV 96.4 96.5  PLT 264 236   CBG:  Recent Labs Lab 02/19/14 1857 02/20/14 2131 02/21/14 0635  GLUCAP 168* 159* 207*    Studies/Results: Dg Toe 2nd  Left  02/20/2014   CLINICAL DATA:  Blister on left second toe possibly with a diabetic ulcer  EXAM: LEFT SECOND TOE  COMPARISON:  DG FOOT COMPLETE*L* dated 09/17/2013  FINDINGS: The bones of the second digit appear intact. There is no lytic or blastic lesion or periosteal reaction. There is mild soft tissue swelling of the digit especially distally. No soft tissue gas collections are demonstrated. The interphalangeal joints appear normal.  IMPRESSION: There is no plain film evidence of osteomyelitis. Soft tissue swelling may reflect cellulitis.   Electronically Signed   By: David  Martinique   On: 02/20/2014 17:21   Medications: I have reviewed the patient's current medications. Scheduled Meds: . gabapentin  600 mg Oral TID  . heparin subcutaneous  5,000 Units Subcutaneous 3 times per day  . hydrochlorothiazide  12.5 mg Oral Daily  . insulin aspart  0-15 Units Subcutaneous TID WC  . insulin glargine  60 Units Subcutaneous QHS  . lisinopril  10 mg Oral QHS  . multivitamin with minerals  1 tablet Oral Daily  . pantoprazole  40 mg Oral Daily  . 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  No fever, chills, nausea.  No leukocytosis, lactic acid within normal limits.  Left foot xray showed no obvious evidence of osteomyelitis but with soft tissue swelling consistent with cellulitis. On exam, 2+ distal pulses and sensation intact in distal extremities; some fluctuance noted over the distal portion of the toe.  Vancomycin and Levaquin initiated in ED. -ortho consult, appreciate recs; per Dr. Sharol Given, patient should continue IV abx through 3/29 then transition to doxycycline 100 mg BID until follow-up on 4/6 -continue vancomycin and Levaquin per pharmacy  -blood cultures x  2 pending -ABI pending   #DM2- A1C 8.6% on 01/06/2014. On Lantus 60 units, Novolog 15 units TID AC, as well as metformin 1000 mg BID. Patient also has significant peripheral neuropathy, on Neurontin 600 mg TID.  -Continue home Lantus + Novolog 6 units TID AC, SSI (moderate)   #HTN- Normotensive on admission. On HCTZ 12.5 mg daily, Lisinopril 10 mg po daily.  -continue home meds   Dispo: Disposition is deferred at this time, awaiting improvement of current medical problems.  Anticipated discharge in approximately 2 day(s).   The patient does have a current PCP Rebecca Eaton, MD) and does need an [facility] follow-up appointment after discharge.   .Services Needed at time of discharge: Y = Yes, Blank = No PT:   OT:   RN:   Equipment:   Other:     LOS: 1 day   Ivin Poot, MD 02/21/2014, 7:31 AM

## 2014-02-21 NOTE — Consult Note (Signed)
Reason for Consult: Infection second toe left foot Referring Physician: Dr Casey Burkitt is an 53 y.o. female.  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 Diabetes mellitus   . Hypertension   . Asthma 11/01/2010    Past Surgical History  Procedure Laterality Date  . Amputation Left 02/19/2013    Procedure: Amputation of Left Great Toe;  Surgeon: Mcarthur Rossetti, MD;  Location: [place];  Service: Orthopedics;  Laterality: Left;  . Ablation      Family History  Problem Relation Age of Onset  . Diabetes Father   . Diabetes Daughter     Social History:  reports that she has never smoked. She does not have any smokeless tobacco history on file. She reports that she does not drink alcohol or use illicit drugs.  Allergies:  Allergies  Allergen Reactions  . Aspirin Nausea And Vomiting  . Ibuprofen Nausea And Vomiting and Other (See Comments)    Abdominal pain  . Penicillins Other (See Comments)    unknown    Medications: I have reviewed the patient's current medications.  Results for orders placed during the hospital encounter of 02/20/14 (from the past 48 hour(s))  CBC WITH DIFFERENTIAL     Status: Abnormal   Collection Time    02/20/14  4:44 PM      Result Value Ref Range   WBC 6.3  4.0 - 10.5 K/uL   RBC 3.94  3.87 - 5.11 MIL/uL   Hemoglobin 12.6  12.0 - 15.0 g/dL   HCT 38.0  36.0 - 46.0 %   MCV 96.4  78.0 - 100.0 fL   MCH 32.0  26.0 - 34.0 pg   MCHC 33.2  30.0 - 36.0 g/dL   RDW 13.0  11.5 - 15.5 %   Platelets 264  150 - 400 K/uL   Neutrophils Relative % 46  43 - 77 %   Neutro Abs 2.9  1.7 - 7.7 K/uL   Lymphocytes Relative 50 (*) 12 - 46 %   Lymphs Abs 3.1  0.7 - 4.0 K/uL   Monocytes Relative 3  3 - 12 %   Monocytes Absolute 0.2  0.1 - 1.0 K/uL   Eosinophils  Relative 1  0 - 5 %   Eosinophils Absolute 0.1  0.0 - 0.7 K/uL   Basophils Relative 0  0 - 1 %   Basophils Absolute 0.0  0.0 - 0.1 K/uL  COMPREHENSIVE METABOLIC PANEL     Status: Abnormal   Collection Time    02/20/14  4:44 PM      Result Value Ref Range   Sodium 141  137 - 147 mEq/L   Potassium 4.8  3.7 - 5.3 mEq/L   Chloride 102  96 - 112 mEq/L   CO2 29  19 - 32 mEq/L   Glucose, Bld 206 (*) 70 - 99 mg/dL   BUN 13  6 - 23 mg/dL   Creatinine, Ser 0.69  0.50 - 1.10 mg/dL   Calcium 9.4  8.4 - 10.5 mg/dL   Total Protein 7.2  6.0 - 8.3 g/dL   Albumin 3.6  3.5 - 5.2 g/dL   AST 27  0 - 37 U/L   ALT 45 (*) 0 - 35 U/L   Alkaline Phosphatase 92  39 - 117 U/L  Total Bilirubin <0.2 (*) 0.3 - 1.2 mg/dL   GFR calc non Af Amer >90  >90 mL/min   GFR calc Af Amer >90  >90 mL/min   Comment: (NOTE)     The eGFR has been calculated using the CKD EPI equation.     This calculation has not been validated in all clinical situations.     eGFR's persistently <90 mL/min signify possible Chronic Kidney     Disease.  I-STAT CG4 LACTIC ACID, ED     Status: None   Collection Time    02/20/14  4:57 PM      Result Value Ref Range   Lactic Acid, Venous 1.29  0.5 - 2.2 mmol/L  CBG MONITORING, ED     Status: Abnormal   Collection Time    02/20/14  9:31 PM      Result Value Ref Range   Glucose-Capillary 159 (*) 70 - 99 mg/dL    Dg Toe 2nd Left  02/20/2014   CLINICAL DATA:  Blister on left second toe possibly with a diabetic ulcer  EXAM: LEFT SECOND TOE  COMPARISON:  DG FOOT COMPLETE*L* dated 09/17/2013  FINDINGS: The bones of the second digit appear intact. There is no lytic or blastic lesion or periosteal reaction. There is mild soft tissue swelling of the digit especially distally. No soft tissue gas collections are demonstrated. The interphalangeal joints appear normal.  IMPRESSION: There is no plain film evidence of osteomyelitis. Soft tissue swelling may reflect cellulitis.   Electronically Signed   By:  David  Martinique   On: 02/20/2014 17:21    Review of Systems  All other systems reviewed and are negative.   Blood pressure 109/47, pulse 78, temperature 97.5 F (36.4 C), temperature source Oral, resp. rate 18, height _0  (1.854 m), weight 134.265 kg (296 lb), SpO2 100.00%. Physical Exam On examination patient has a good dorsalis pedis pulse she does have swelling of the toe consistent with possible osteomyelitis but there is no drainage. Radiograph shows no significant destructive changes no signs of any chronic osteomyelitis. Assessment/Plan: Assessment: Cellulitis left foot second toe. Plan recommend continue IV antibiotics for total 72 hours and discharged to home on doxycycline 100 mg twice a day. I will followup in the office in one week after discharge  Vaiden Adames V 02/21/2014, 6:28 AM

## 2014-02-21 NOTE — H&P (Signed)
INTERNAL MEDICINE TEACHING ATTENDING NOTE  Day 1 of stay  Patient name: [name]  MRN: [id number] Date of birth: 06-06-1961   53 y.o.diabetic female with multiple toe amputations history coming in with left foot second toe swelling. No fever, foot pain, chills, nausea, vomiting, palpitations, chest pain per patient report. Closed (not draining) slightly fluctuant non-tender wound with discolored skin on plantar surface of toe. Good pulses. Xray does not show osteomyelitic extensions. Dr. Sharol Given consulted, appreciate recs. We will cover cellulitic wound with IV Vancomycin for 3 days and discharge home on doxycycline per his recommendations, given the worse history that the patient has. DM being managed on home lantus dosing and ISS, blood glucose levels with average control.    I have seen and evaluated this patient and discussed it with my IM resident team.  Please see the rest of the plan per resident note from today.   [place], [place] 02/21/2014, 11:29 AM.

## 2014-02-21 NOTE — Progress Notes (Signed)
Inpatient Diabetes Program Recommendations  AACE/ADA: New Consensus Statement on Inpatient Glycemic Control (2013)  Target Ranges:  Prepandial:   less than 140 mg/dL      Peak postprandial:   less than 180 mg/dL (1-2 hours)      Critically ill patients:  140 - 180 mg/dL   Reason for Visit: Results for [name], [name] (MRN [id number]) as of 02/21/2014 12:46  Ref. Range 02/20/2014 21:31 02/21/2014 06:35 02/21/2014 12:10  Glucose-Capillary Latest Range: 70-99 mg/dL 159 (H) 207 (H) 217 (H)   Diabetes history: Type 2 diabetes Outpatient Diabetes medications: Lantus 60 units q HS, Novolog 15 units tid with meals, Metformin 1000 mg bid  Current orders for Inpatient glycemic control: Lantus 60 units q HS, Novolog moderate tid with meals  Note that A1C in February 2015 was 8.6% which was improvement from 2014.  While in the hospital consider restarting a portion of patient's home dose of Novolog meal coverage.  Consider adding Novolog meal coverage 6 units tid with meals (hold if patient eats less than 50%).    Adah Perl, RN, BC-ADM Inpatient Diabetes Coordinator Pager [phone]

## 2014-02-22 DIAGNOSIS — E119 Type 2 diabetes mellitus without complications: Secondary | ICD-10-CM

## 2014-02-22 DIAGNOSIS — I10 Essential (primary) hypertension: Secondary | ICD-10-CM

## 2014-02-22 DIAGNOSIS — L97509 Non-pressure chronic ulcer of other part of unspecified foot with unspecified severity: Secondary | ICD-10-CM

## 2014-02-22 LAB — GLUCOSE, CAPILLARY
GLUCOSE-CAPILLARY: 220 mg/dL — AB (ref 70–99)
GLUCOSE-CAPILLARY: 237 mg/dL — AB (ref 70–99)
GLUCOSE-CAPILLARY: 234 mg/dL — AB (ref 70–99)
Glucose-Capillary: 139 mg/dL — ABNORMAL HIGH (ref 70–99)

## 2014-02-22 MED ORDER — VANCOMYCIN HCL 10 G IV SOLR
1250.0000 mg | Freq: Two times a day (BID) | INTRAVENOUS | Status: DC
  Administered 2014-02-22 – 2014-02-23 (×3): 1250 mg via INTRAVENOUS
  Filled 2014-02-22 (×4): qty 1250

## 2014-02-22 MED ORDER — INSULIN ASPART 100 UNIT/ML ~~LOC~~ SOLN
10.0000 [IU] | Freq: Three times a day (TID) | SUBCUTANEOUS | Status: DC
  Administered 2014-02-22: 10 [IU] via SUBCUTANEOUS

## 2014-02-22 MED ORDER — LEVOFLOXACIN IN D5W 500 MG/100ML IV SOLN
500.0000 mg | INTRAVENOUS | Status: DC
  Administered 2014-02-22 – 2014-02-23 (×2): 500 mg via INTRAVENOUS
  Filled 2014-02-22 (×2): qty 100

## 2014-02-22 MED ORDER — INSULIN ASPART 100 UNIT/ML ~~LOC~~ SOLN
12.0000 [IU] | Freq: Three times a day (TID) | SUBCUTANEOUS | Status: DC
  Administered 2014-02-22 – 2014-02-23 (×4): 12 [IU] via SUBCUTANEOUS

## 2014-02-22 MED ORDER — DOXYCYCLINE HYCLATE 50 MG PO CAPS: 100.0000 mg | ORAL_CAPSULE | Freq: Two times a day (BID) | ORAL | Status: DC

## 2014-02-22 NOTE — Progress Notes (Signed)
VASCULAR LAB PRELIMINARY  ARTERIAL  ABI completed:    RIGHT    LEFT    PRESSURE WAVEFORM  PRESSURE WAVEFORM  BRACHIAL 122 Triphasic BRACHIAL 118 Triphasic  DP 118 Biphasic DP 120 Biphasic  PT 127 Biphasic PT 128 Biphasic    RIGHT LEFT  ABI 1.04 1.05   ABIs and Doppler waveforms are within normal limits bilaterally at rest  Shantel Helwig, RVS 02/22/2014, 5:02 PM

## 2014-02-22 NOTE — Progress Notes (Signed)
Patient to vascular lab for ABI. Levaquin hanging as piggyback on pump and programed. Transporter takes pt in wheelchair alongside-ivp.

## 2014-02-22 NOTE — ED Provider Notes (Signed)
Medical screening examination/treatment/procedure(s) were conducted as a shared visit with non-physician practitioner(s) and myself.  I personally evaluated the patient during the encounter.   EKG Interpretation None      [name] is a 53 y.o. female hx of DM, HTN here with L second toe swelling and pain since yesterday. She had previous L big toe amputation that she said started similarly. No fever. Vitals stable. L sencond toe appears gangrenous. Xray showed no osteo. She was given vanc/levaquin (PCN allergic). I called Dr. Trevor Mace group for eval. Patient will be admitted to internal medicine teaching service.    [name], MD 02/22/14 [phone]

## 2014-02-22 NOTE — Progress Notes (Signed)
Subjective: [name] is doing well this morning, thinks her foot looks better.  She is concerned about her blood sugars running in 200s thus took a dose of her own Novolog 18 units last night around 9:30pm.  We discussed her not taking her own medications while in the hospital; agreed to increase Novolog to 10 units TID [facility] for now.   Objective: Vital signs in last 24 hours: Filed Vitals:   02/21/14 0533 02/21/14 1827 02/21/14 2209 02/22/14 0639  BP: 109/47 126/79 135/68 145/82  Pulse: 78 95 75 72  Temp: 97.5 F (36.4 C) 98.3 F (36.8 C) 98.7 F (37.1 C) 99.2 F (37.3 C)  TempSrc: Oral Oral    Resp: 18 18 18 18   Height:      Weight:      SpO2: 100% 99% 99% 99%   Weight change:   Intake/Output Summary (Last 24 hours) at 02/22/14 0859 Last data filed at 02/22/14 0745  Gross per 24 hour  Intake    360 ml  Output      0 ml  Net    360 ml   PEX General: alert, cooperative, and in no apparent distress HEENT: NCAT, vision grossly intact, oropharynx clear and non-erythematous  Neck: supple, no lymphadenopathy Lungs: clear to ascultation bilaterally, normal work of respiration, no wheezes, rales, ronchi Heart: regular rate and rhythm, no murmurs, gallops, or rubs Abdomen: soft, non-tender, non-distended, normal bowel sounds Extremities: s/p left great toe amputation, 2nd toe with dark lesion and some swelling in addition to callous, no discharge or erythema noted; 2+ DP/PT pulses bilaterally, no cyanosis, clubbing, or edema Neurologic: alert & oriented X3, cranial nerves II-XII intact, strength grossly intact, sensation intact to light touch  Lab Results: Basic Metabolic Panel:  Recent Labs Lab 02/20/14 1644 02/21/14 0539  NA 141 141  K 4.8 4.5  CL 102 103  CO2 29 28  GLUCOSE 206* 237*  BUN 13 14  CREATININE 0.69 0.65  CALCIUM 9.4 9.3   Liver Function Tests:  Recent Labs Lab 02/20/14 1644  AST 27  ALT 45*  ALKPHOS 92  BILITOT <0.2*  PROT 7.2  ALBUMIN 3.6     CBC:  Recent Labs Lab 02/20/14 1644 02/21/14 0539  WBC 6.3 5.4  NEUTROABS 2.9  --   HGB 12.6 11.8*  HCT 38.0 36.0  MCV 96.4 96.5  PLT 264 236   CBG:  Recent Labs Lab 02/20/14 2131 02/21/14 0635 02/21/14 1210 02/21/14 1646 02/21/14 2237 02/22/14 0714  GLUCAP 159* 207* 217* 216* 257* 139*    Studies/Results: Dg Toe 2nd Left  02/20/2014   CLINICAL DATA:  Blister on left second toe possibly with a diabetic ulcer  EXAM: LEFT SECOND TOE  COMPARISON:  DG FOOT COMPLETE*L* dated 09/17/2013  FINDINGS: The bones of the second digit appear intact. There is no lytic or blastic lesion or periosteal reaction. There is mild soft tissue swelling of the digit especially distally. No soft tissue gas collections are demonstrated. The interphalangeal joints appear normal.  IMPRESSION: There is no plain film evidence of osteomyelitis. Soft tissue swelling may reflect cellulitis.   Electronically Signed   By: David  Martinique   On: 02/20/2014 17:21   Medications: I have reviewed the patient's current medications. Scheduled Meds: . gabapentin  600 mg Oral TID  . heparin subcutaneous  5,000 Units Subcutaneous 3 times per day  . hydrochlorothiazide  12.5 mg Oral Daily  . insulin aspart  0-15 Units Subcutaneous TID WC  .  insulin aspart  6 Units Subcutaneous TID WC  . insulin glargine  60 Units Subcutaneous QHS  . levofloxacin (LEVAQUIN) IV  500 mg Intravenous Q24H  . lisinopril  10 mg Oral QHS  . multivitamin with minerals  1 tablet Oral Daily  . pantoprazole  40 mg Oral Daily  . sodium chloride  3 mL Intravenous Q12H  . vancomycin  1,250 mg Intravenous Q12H   Continuous Infusions:  PRN Meds:.sodium chloride, sodium chloride Assessment/Plan: #Cellulitis of left 2nd toe- Patient presented with aching and color change in toe; history of peripheral neuropathy 2/2 diabetes, s/p left great toe amputation.  No fever, chills, nausea.  No leukocytosis, lactic acid within normal limits.  Left foot xray  showed no obvious evidence of osteomyelitis but with soft tissue swelling consistent with cellulitis. On exam, 2+ distal pulses and sensation intact in distal extremities; some ?fluctuance noted over the distal portion of left second toe.  Vancomycin and Levaquin initiated in ED. -ortho consult, appreciate recs; per Dr. Sharol Given, patient should continue IV abx through 3/29 then transition to doxycycline 100 mg BID until follow-up on 4/6 -continue vancomycin and Levaquin per pharmacy  -blood cultures x 2 pending -ABI pending  -BMP in AM   #DM2- A1C 8.6% on 01/06/2014. On Lantus 60 units, Novolog 15 units TID AC + SSI, as well as metformin 1000 mg BID. Patient also has significant peripheral neuropathy, on Neurontin 600 mg TID.  -continue home Lantus 60 units qhs + Novolog 10 units TID AC, SSI (moderate)  -holding metformin while inpatient   #HTN- Controlled. On HCTZ 12.5 mg daily, Lisinopril 10 mg po daily.  -continue home meds   Dispo: Disposition is deferred at this time, awaiting improvement of current medical problems.  Anticipated discharge in approximately 1 day(s).   The patient does have a current PCP Rebecca Eaton, MD) and does need an [facility] hospital follow-up appointment after discharge.   .Services Needed at time of discharge: Y = Yes, Blank = No PT:   OT:   RN:   Equipment:   Other:     LOS: 2 days   Ivin Poot, MD 02/22/2014, 8:59 AM

## 2014-02-23 DIAGNOSIS — I96 Gangrene, not elsewhere classified: Secondary | ICD-10-CM

## 2014-02-23 LAB — BASIC METABOLIC PANEL
BUN: 15 mg/dL (ref 6–23)
CO2: 28 mEq/L (ref 19–32)
Calcium: 8.9 mg/dL (ref 8.4–10.5)
Chloride: 102 mEq/L (ref 96–112)
Creatinine, Ser: 0.66 mg/dL (ref 0.50–1.10)
GFR calc Af Amer: >90 mL/min (ref >90)
GLUCOSE: 244 mg/dL — AB (ref 70–99)
POTASSIUM: 4.4 meq/L (ref 3.7–5.3)
SODIUM: 140 meq/L (ref 137–147)

## 2014-02-23 LAB — GLUCOSE, CAPILLARY
GLUCOSE-CAPILLARY: 232 mg/dL — AB (ref 70–99)
GLUCOSE-CAPILLARY: 180 mg/dL — AB (ref 70–99)
Glucose-Capillary: 213 mg/dL — ABNORMAL HIGH (ref 70–99)
Glucose-Capillary: 199 mg/dL — ABNORMAL HIGH (ref 70–99)

## 2014-02-23 MED ORDER — DOXYCYCLINE HYCLATE 50 MG PO CAPS: 100.0000 mg | ORAL_CAPSULE | Freq: Two times a day (BID) | ORAL | Status: DC

## 2014-02-23 NOTE — Discharge Summary (Signed)
Name: [name] MRN: [id number] DOB: 03-Jul-1961 54 y.o. PCP: Rebecca Eaton, MD  Date of Admission: 02/20/2014  4:14 PM Date of Discharge: 02/23/2014 Attending Physician: Madilyn Fireman, MD  Discharge Diagnosis: Principal Problem:   Gangrene and abscess of left 2nd toe Active Problems:   DIABETES MELLITUS, TYPE II   DIABETIC PERIPHERAL NEUROPATHY   HYPERTENSION, BENIGN ESSENTIAL   ASTHMA   Cellulitis of second toe, left  Discharge Medications:   Medication List         acetaminophen 500 MG tablet  Commonly known as:  TYLENOL  Take 1,000 mg by mouth every 6 (six) hours as needed for moderate pain.     doxycycline 50 MG capsule  Commonly known as:  VIBRAMYCIN  Take 2 capsules (100 mg total) by mouth 2 (two) times daily.     gabapentin 300 MG capsule  Commonly known as:  NEURONTIN  Take 2 capsules (600 mg total) by mouth 3 (three) times daily.     hydrochlorothiazide 12.5 MG capsule  Commonly known as:  MICROZIDE  Take 1 capsule (12.5 mg total) by mouth daily.     insulin aspart 100 UNIT/ML injection  Commonly known as:  NOVOLOG  Inject 15 Units plus correction amount 3 times daily before meals up to a total of 60 Units     Insulin Glargine 100 UNIT/ML Solostar Pen  Commonly known as:  LANTUS SOLOSTAR  Inject 60 Units into the skin at bedtime.     lisinopril 10 MG tablet  Commonly known as:  PRINIVIL,ZESTRIL  Take 10 mg by mouth at bedtime.     metFORMIN 1000 MG tablet  Commonly known as:  GLUCOPHAGE  Take 1 tablet (1,000 mg total) by mouth 2 (two) times daily with a meal.     multivitamin with minerals Tabs tablet  Take 1 tablet by mouth daily.     omeprazole 20 MG capsule  Commonly known as:  PRILOSEC  Take 20 mg by mouth at bedtime. Acid reflux        Disposition and follow-up:   [name] was discharged from [facility] in Stable condition.  At the hospital follow up visit please address:  1.  Status of let second  toe, compliance with doxycycline  2.  Diabetes control  3.  Labs / imaging needed at time of follow-up: none  4.  Pending labs/ test needing follow-up: none  Follow-up Appointments: Follow-up Information   Follow up with Newt Minion, MD On 03/03/2014. (2:45pm)    Specialty:  Orthopedic Surgery   Contact information:   [place] [address] [phone]       Discharge Instructions: Discharge Orders   Future Appointments Provider Department Dept Phone   04/07/2014 3:45 PM Rebecca Eaton, MD [place] [phone]   Future Orders Complete By Expires   Call MD for:  temperature >100.4  As directed    Diet - low sodium heart healthy  As directed    Increase activity slowly  As directed       Consultations:  ortho  Procedures Performed:  Dg Toe 2nd Left  02/20/2014   CLINICAL DATA:  Blister on left second toe possibly with a diabetic ulcer  EXAM: LEFT SECOND TOE  COMPARISON:  DG FOOT COMPLETE*L* dated 09/17/2013  FINDINGS: The bones of the second digit appear intact. There is no lytic or blastic lesion or periosteal reaction. There is mild soft tissue swelling of the digit especially distally. No  soft tissue gas collections are demonstrated. The interphalangeal joints appear normal.  IMPRESSION: There is no plain film evidence of osteomyelitis. Soft tissue swelling may reflect cellulitis.   Electronically Signed   By: David  Martinique   On: 02/20/2014 17:21    Admission HPI:  [name] is a 53 y.o. female w/ PMHx of DM type II w/ peripheral neuropathy (HbA1c 8.6), HTN, Asthma, and previous left great toe amputation, presents to the ED w/ complaints of left second toe pain and swelling which started yesterday. Patient describes the pain as a mild, dull, achy pain, worse w/ ambulation, mostly present on the dorsum of the toe. She also noted a dark color change w/ a mild swelling. She claims since she had her right great toe amputated in  01/2013, she has been very careful about taking care of her feet and noticed a significant difference in this toe yesterday. She denies discharge, foul smell, and change in temperature. Denies fever or chills, nausea, or vomiting.  On arrival to the ED, patient noted to have stable vitals, no leukocytosis, and XR negative for cellulitis.    Hospital Course by problem list: 1. Cellulitis of left 2nd toe- Patient presented with aching and color change in toe; history of peripheral neuropathy 2/2 diabetes, s/p left great toe amputation. No fever, chills, nausea. No leukocytosis, lactic acid within normal limits. Left foot xray showed no evidence of osteomyelitis but with soft tissue swelling consistent with cellulitis. On exam, 2+ distal pulses and sensation intact in distal extremities; some ?fluctuance noted over the distal portion of left second toe, improved. Orthopedic surgery (Dr. Sharol Given) consulted on admission, recommended IV antibiotics x 72 hours.  Vancomycin and Levaquin initiated on 3/27, continued through evening of 3/29. Blood cultures x 2 NGTD. ABI negative. Patient discharged on 3/29 with prescription for doxycycline 100 mg BID x 8 days. Ortho follow-up arranged on 4/6 prior to discharge.   2. DM2- A1C 8.6% on 01/06/2014. On Lantus 60 units, Novolog 15 units TID AC + SSI, as well as metformin 1000 mg BID at home. Patient also has significant peripheral neuropathy, on Neurontin 600 mg TID. Continued home Lantus 60 units qhs + Novolog 12 units TID AC, SSI (moderate) while inpatient.  Held metformin while inpatient, restarted at discharge.   3. HTN- Controlled. On HCTZ 12.5 mg daily, Lisinopril 10 mg po daily at home; continued while inpatient and at discharge.    Discharge Vitals:   BP 119/75  Pulse 81  Temp(Src) 97.7 F (36.5 C) (Oral)  Resp 18  Ht 6\' 1"  (1.854 m)  Wt 296 lb (134.265 kg)  BMI 39.06 kg/m2  SpO2 100%  Discharge Labs:  Results for orders placed during the hospital  encounter of 02/20/14 (from the past 24 hour(s))  GLUCOSE, CAPILLARY     Status: Abnormal   Collection Time    02/22/14 11:12 AM      Result Value Ref Range   Glucose-Capillary 220 (*) 70 - 99 mg/dL   Comment 1 Notify RN     Comment 2 Documented in Chart    GLUCOSE, CAPILLARY     Status: Abnormal   Collection Time    02/22/14  5:58 PM      Result Value Ref Range   Glucose-Capillary 237 (*) 70 - 99 mg/dL  GLUCOSE, CAPILLARY     Status: Abnormal   Collection Time    02/22/14 10:43 PM      Result Value Ref Range   Glucose-Capillary 234 (*)  70 - 99 mg/dL  BASIC METABOLIC PANEL     Status: Abnormal   Collection Time    02/23/14  4:31 AM      Result Value Ref Range   Sodium 140  137 - 147 mEq/L   Potassium 4.4  3.7 - 5.3 mEq/L   Chloride 102  96 - 112 mEq/L   CO2 28  19 - 32 mEq/L   Glucose, Bld 244 (*) 70 - 99 mg/dL   BUN 15  6 - 23 mg/dL   Creatinine, Ser 0.66  0.50 - 1.10 mg/dL   Calcium 8.9  8.4 - 10.5 mg/dL   GFR calc non Af Amer >90  >90 mL/min   GFR calc Af Amer >90  >90 mL/min  GLUCOSE, CAPILLARY     Status: Abnormal   Collection Time    02/23/14  7:02 AM      Result Value Ref Range   Glucose-Capillary 232 (*) 70 - 99 mg/dL    Signed: Ivin Poot, MD 02/23/2014, 10:47 AM   Time Spent on Discharge: 35 minutes Services Ordered on Discharge: none Equipment Ordered on Discharge: none

## 2014-02-23 NOTE — Progress Notes (Signed)
Subjective: [name] is doing well this morning, thinks her foot continues to look better, no complaints.    Objective: Vital signs in last 24 hours: Filed Vitals:   02/22/14 0639 02/22/14 1327 02/22/14 2128 02/23/14 0612  BP: 145/82 123/66 118/70 119/75  Pulse: 72 85 86 81  Temp: 99.2 F (37.3 C) 98.1 F (36.7 C) 97.7 F (36.5 C) 97.7 F (36.5 C)  TempSrc:    Oral  Resp: 18 18 18 18   Height:      Weight:      SpO2: 99% 99% 100% 100%   Weight change:   Intake/Output Summary (Last 24 hours) at 02/23/14 0656 Last data filed at 02/22/14 2012  Gross per 24 hour  Intake   1163 ml  Output      0 ml  Net   1163 ml   PEX General: alert, cooperative, and in no apparent distress HEENT: NCAT, vision grossly intact, oropharynx clear and non-erythematous  Neck: supple, no lymphadenopathy Lungs: clear to ascultation bilaterally, normal work of respiration, no wheezes, rales, ronchi Heart: regular rate and rhythm, no murmurs, gallops, or rubs Abdomen: soft, non-tender, non-distended, normal bowel sounds Extremities: s/p left great toe amputation, 2nd toe with dark lesion and some swelling in addition to callous, no discharge or erythema noted; 2+ DP/PT pulses bilaterally, no cyanosis, clubbing, or edema Neurologic: alert & oriented X3, cranial nerves II-XII intact, strength grossly intact, sensation intact to light touch  Lab Results: Basic Metabolic Panel:  Recent Labs Lab 02/21/14 0539 02/23/14 0431  NA 141 140  K 4.5 4.4  CL 103 102  CO2 28 28  GLUCOSE 237* 244*  BUN 14 15  CREATININE 0.65 0.66  CALCIUM 9.3 8.9   Liver Function Tests:  Recent Labs Lab 02/20/14 1644  AST 27  ALT 45*  ALKPHOS 92  BILITOT <0.2*  PROT 7.2  ALBUMIN 3.6   CBC:  Recent Labs Lab 02/20/14 1644 02/21/14 0539  WBC 6.3 5.4  NEUTROABS 2.9  --   HGB 12.6 11.8*  HCT 38.0 36.0  MCV 96.4 96.5  PLT 264 236   CBG:  Recent Labs Lab 02/21/14 1646 02/21/14 2237 02/22/14 0714  02/22/14 1112 02/22/14 1758 02/22/14 2243  GLUCAP 216* 257* 139* 220* 237* 234*    Medications: I have reviewed the patient's current medications. Scheduled Meds: . gabapentin  600 mg Oral TID  . heparin subcutaneous  5,000 Units Subcutaneous 3 times per day  . hydrochlorothiazide  12.5 mg Oral Daily  . insulin aspart  0-15 Units Subcutaneous TID WC  . insulin aspart  12 Units Subcutaneous TID WC  . insulin glargine  60 Units Subcutaneous QHS  . levofloxacin (LEVAQUIN) IV  500 mg Intravenous Q24H  . lisinopril  10 mg Oral QHS  . multivitamin with minerals  1 tablet Oral Daily  . pantoprazole  40 mg Oral Daily  . sodium chloride  3 mL Intravenous Q12H  . vancomycin  1,250 mg Intravenous Q12H   Continuous Infusions:  PRN Meds:.sodium chloride, sodium chloride Assessment/Plan: #Cellulitis of left 2nd toe- Patient presented with aching and color change in toe; history of peripheral neuropathy 2/2 diabetes, s/p left great toe amputation.  No fever, chills, nausea.  No leukocytosis, lactic acid within normal limits.  Left foot xray showed no obvious evidence of osteomyelitis but with soft tissue swelling consistent with cellulitis. On exam, 2+ distal pulses and sensation intact in distal extremities; some ?fluctuance noted over the distal portion of left second  toe, improved.  Vancomycin and Levaquin initiated on 3/27.  Blood cultures x 2 NGTD.  ABI negative.  -ortho consult, appreciate recs; per Dr. Sharol Given, patient should continue IV abx through 3/29 then transition to doxycycline 100 mg BID until follow-up on 4/6 -continue vancomycin and Levaquin per pharmacy through this evening  #DM2- A1C 8.6% on 01/06/2014. On Lantus 60 units, Novolog 15 units TID AC + SSI, as well as metformin 1000 mg BID at home. Patient also has significant peripheral neuropathy, on Neurontin 600 mg TID.  -continue home Lantus 60 units qhs + Novolog 12 units TID AC, SSI (moderate)  -holding metformin while inpatient    #HTN- Controlled. On HCTZ 12.5 mg daily, Lisinopril 10 mg po daily.  -continue home meds   Dispo:  Anticipated discharge today.   The patient does have a current PCP Rebecca Eaton, MD) and does need an [facility] hospital follow-up appointment after discharge.   .Services Needed at time of discharge: Y = Yes, Blank = No PT:   OT:   RN:   Equipment:   Other:     LOS: 3 days   Ivin Poot, MD 02/23/2014, 6:56 AM

## 2014-02-23 NOTE — Discharge Instructions (Signed)
Please take your antibiotics as prescribed.  Don't forget your follow-up appointment with Dr. Sharol Given on 4/6.  (You can call [phone] to reschedule the time that day.)

## 2014-02-23 NOTE — Progress Notes (Signed)
Pt's IV  abx completed and pt discharged. Taken by w/c by NT  --  family with pt /will take her home.

## 2014-02-26 ENCOUNTER — Telehealth: Payer: Self-pay | Admitting: *Deleted

## 2014-02-26 NOTE — Telephone Encounter (Signed)
Pt request refill on Tramadol.  Not on med list, please advise

## 2014-02-27 ENCOUNTER — Telehealth: Payer: Self-pay | Admitting: *Deleted

## 2014-02-27 ENCOUNTER — Ambulatory Visit: Payer: No Typology Code available for payment source

## 2014-02-27 LAB — CULTURE, BLOOD (ROUTINE X 2)
CULTURE: NO GROWTH
Culture: NO GROWTH

## 2014-02-27 NOTE — Telephone Encounter (Signed)
When I last saw patient she reported to me that she did not tolerate tramadol secondary to side effects. Patient will see me in clinic on 4/6. We can discuss the tramadol at that appointment. I will not plan to refill this medication at this time.

## 2014-02-27 NOTE — Telephone Encounter (Signed)
Pharmacy informed.

## 2014-02-27 NOTE — Telephone Encounter (Signed)
Pt presents at front desk wanting to speak to someone about her discharge diagnosis last week. She states she is very upset that no one told her she had gangrene. She was given an appt w/ dr Mechele Claude for 4/6 at 1315 and already has an appt w/ dr duda for mon at 1445. She stated she is aware that she had a serious infection and that it was explained to her and the possible outcomes but if she had gangrene she would have taken it differently. i explained that the physician needs to discuss her diagnosis and lab results with her and i am sure that both her doctors will do so and for her to explain to them that she wants to be told using medical terminology what is wrong with her always, she states she is pro active in her care and she expects her physicians to be able to talk to her and tell her exactly what is wrong.

## 2014-02-28 NOTE — Discharge Summary (Signed)
I have reviewed this discharge summary and agree with the disposition.

## 2014-03-03 ENCOUNTER — Ambulatory Visit: Payer: Self-pay

## 2014-03-03 ENCOUNTER — Encounter: Payer: Self-pay | Admitting: Internal Medicine

## 2014-03-03 ENCOUNTER — Ambulatory Visit (INDEPENDENT_AMBULATORY_CARE_PROVIDER_SITE_OTHER): Payer: No Typology Code available for payment source | Admitting: Internal Medicine

## 2014-03-03 VITALS — BP 124/82 | HR 90 | Temp 98.1°F | Ht 73.0 in | Wt 298.2 lb

## 2014-03-03 DIAGNOSIS — I96 Gangrene, not elsewhere classified: Secondary | ICD-10-CM

## 2014-03-03 DIAGNOSIS — K219 Gastro-esophageal reflux disease without esophagitis: Secondary | ICD-10-CM

## 2014-03-03 DIAGNOSIS — E1159 Type 2 diabetes mellitus with other circulatory complications: Secondary | ICD-10-CM

## 2014-03-03 LAB — GLUCOSE, CAPILLARY: Glucose-Capillary: 141 mg/dL — ABNORMAL HIGH (ref 70–99)

## 2014-03-03 MED ORDER — OMEPRAZOLE 20 MG PO CPDR: 40.0000 mg | DELAYED_RELEASE_CAPSULE | Freq: Every day | ORAL | Status: DC

## 2014-03-03 MED ORDER — OMEPRAZOLE 20 MG PO CPDR: 20.0000 mg | DELAYED_RELEASE_CAPSULE | Freq: Every day | ORAL | Status: DC

## 2014-03-03 NOTE — Patient Instructions (Addendum)
Thank you for your visit.   Continue your antibiotics as previously prescribed. You are doing the right thing by taking your diabetes medications and checking your feet on a daily basis.   Follow up with Dr. Sharol Given today as scheduled.   Please keep your appointment with me in May and we will discuss podiatry referral and diabetic shoes.   I increased your prilosec to 40mg  daily.

## 2014-03-03 NOTE — Progress Notes (Signed)
INTERNAL MEDICINE TEACHING ATTENDING ADDENDUM - Nischal Narendra, MD: I reviewed and discussed at the time of visit with the resident Dr. Chikowski, the patient's medical history, physical examination, diagnosis and results of tests and treatment and I agree with the patient's care as documented. 

## 2014-03-03 NOTE — Progress Notes (Signed)
[name], female   DOB: 22-May-1961, 53 y.o.   MRN: [id number] HPI The patient is a 53 y.o. female with a history of DM type II w/ peripheral neuropathy (HbA1c 8.6), HTN, Asthma, and previous left great toe amputation who was recently admitted 3/26-3/29 for L 2nd toe gangrene and abscess.   Patient was treated in the hospital with 4 days of IV abx (vanc and levaquin). Dr. Sharol Given was consulted. No evidence of osteo per xray of foot. BCx x 2 show NG at 5 days. ABIs normal during admission. Patient was discharged with 8 more days of doxycycline 100mg  BID, which she has been compliant with. She has 2.5 more days left of this antibiotic. Patient notes she inspects her feet at least daily and that she was the one who first noticed changes to her L 2nd toe. Patient expresses to me today that her main concern is that she was not told explicitly during her hospital stay that she has "gangrene." This is more serious to her than just having an infection of her toe and she is upset by this. Patient also tearful when describing the fact that she feels she does everything she can to control her DM and take care of her feet, yet she developed another infection of her L foot despite her diligence.  ROS: General: no fevers, chills Skin: no rash HEENT: no ST, HA Pulm: no dyspnea, coughing CV: no chest pain, shortness of breath Abd: no abdominal pain, nausea/vomiting, diarrhea GU: no dysuria Ext: no arthralgias, myalgias Neuro: she has nerve pain and numbness to b/l feet, no weakness to legs/arms  Filed Vitals:   03/03/14 1328  BP: 124/82  Pulse: 90  Temp: 98.1 F (36.7 C)  SpO2 100%  Physical Exam General: alert, cooperative, and in no apparent distress HEENT:  vision grossly intact, oropharynx clear and non-erythematous Neck: supple Lungs: clear to ascultation bilaterally, normal work of respiration Heart: regular rate and rhythm, no murmurs, gallops, or rubs Abdomen: soft, non-tender,  non-distended, normal bowel sounds Extremities:no pedal edema; strong DP pulses b/l; L second toe dark colored and with firm texture over interphalangeal joint as well as to the tip of the toe, otherwise flesh is soft and warm, there is no erythema or fluctuance; no TTP to the area Neurologic: alert & oriented X3, cranial nerves II-XII grossly intact, strength grossly intact, sensation diminished to BLE  Current Outpatient Prescriptions on File Prior to Visit  Medication Sig Dispense Refill  . acetaminophen (TYLENOL) 500 MG tablet Take 1,000 mg by mouth every 6 (six) hours as needed for moderate pain.      Marland Kitchen doxycycline (VIBRAMYCIN) 50 MG capsule Take 2 capsules (100 mg total) by mouth 2 (two) times daily.  32 capsule  0  . gabapentin (NEURONTIN) 300 MG capsule Take 2 capsules (600 mg total) by mouth 3 (three) times daily.  180 capsule  3  . hydrochlorothiazide (MICROZIDE) 12.5 MG capsule Take 1 capsule (12.5 mg total) by mouth daily.  30 capsule  4  . insulin aspart (NOVOLOG) 100 UNIT/ML injection Inject 15 Units plus correction amount 3 times daily before meals up to a total of 60 Units  60 mL  4  . Insulin Glargine (LANTUS SOLOSTAR) 100 UNIT/ML Solostar Pen Inject 60 Units into the skin at bedtime.  45 mL  4  . lisinopril (PRINIVIL,ZESTRIL) 10 MG tablet Take 10 mg by mouth at bedtime.      . metFORMIN (GLUCOPHAGE) 1000 MG tablet Take 1  tablet (1,000 mg total) by mouth 2 (two) times daily with a meal.  60 tablet  4  . Multiple Vitamin (MULTIVITAMIN WITH MINERALS) TABS tablet Take 1 tablet by mouth daily.      . [DISCONTINUED] benazepril (LOTENSIN) 10 MG tablet Take 10 mg by mouth daily.      . [DISCONTINUED] insulin regular (NOVOLIN R,HUMULIN R) 100 units/mL injection Inject 0.15 mLs (15 Units total) into the skin 3 (three) times daily before meals.  10 mL  12  . [DISCONTINUED] potassium chloride (K-DUR) 10 MEQ tablet Take 20 mEq by mouth daily.       No current facility-administered medications  on file prior to visit.    Assessment/Plan

## 2014-03-03 NOTE — Assessment & Plan Note (Signed)
Patient has been compliant with her doxycycline. There is no evidence of active infection at this time, though there are hardened areas of tissue. Diminished sensation throughout and no pain to the toe whatsoever. Patient will follow up with Dr. Sharol Given today after our visit. She is frustrated by this situation. I asked her to continue foot checks daily, if not multiple times per day. Continue to take insulin and metformin to obtain better blood glucose control as last A1c in February was 8.6%. She is finishing the paper work for the orange card application today. She is scheduled to see me in May. I asked her to keep this appointment and at this visit we will recheck her A1c, assess if she can afford diabetic shoes, and refer to podiatry. Pt is waiting on disability before she can afford the diabetic shoes and she thinks she will have an answer by the time we meet next.

## 2014-03-03 NOTE — Assessment & Plan Note (Signed)
Increased omeprazole to 40mg  daily as she thinks the 20mg  no longer working for her. Will reassess at her next visit in 1 month.

## 2014-03-04 ENCOUNTER — Ambulatory Visit: Payer: Self-pay

## 2014-04-07 ENCOUNTER — Encounter: Payer: Self-pay | Admitting: Internal Medicine

## 2014-05-19 ENCOUNTER — Encounter: Payer: No Typology Code available for payment source | Admitting: Internal Medicine

## 2014-06-17 ENCOUNTER — Encounter (HOSPITAL_BASED_OUTPATIENT_CLINIC_OR_DEPARTMENT_OTHER): Payer: No Typology Code available for payment source | Attending: General Surgery

## 2014-06-17 DIAGNOSIS — L84 Corns and callosities: Secondary | ICD-10-CM | POA: Insufficient documentation

## 2014-06-17 DIAGNOSIS — E1169 Type 2 diabetes mellitus with other specified complication: Secondary | ICD-10-CM | POA: Insufficient documentation

## 2014-06-17 DIAGNOSIS — L97509 Non-pressure chronic ulcer of other part of unspecified foot with unspecified severity: Secondary | ICD-10-CM | POA: Insufficient documentation

## 2014-06-18 NOTE — H&P (Signed)
NAMESOLIMAR, MAIDEN              ACCOUNT NO.:  [id number]  MEDICAL RECORD NO.:  [id number]  LOCATION:  FOOT                         FACILITY:  [facility]  PHYSICIAN:  Elesa Hacker, M.D.        DATE OF BIRTH:  [birth year]  DATE OF ADMISSION:  06/17/2014 DATE OF DISCHARGE:                             HISTORY & PHYSICAL   CHIEF COMPLAINT:  Wound on left second toe.  HISTORY OF PRESENT ILLNESS:  This patient had a wound and infection, treated with IV antibiotics in April of this year.  For the last couple of weeks, she has noticed that the toe has swollen and a wound at the tip of the toe has become evident.  PAST MEDICAL HISTORY:  Significant for, 1. Diabetes mellitus with neuropathy. 2. Hypertension. 3. Asthma.  SOCIAL HISTORY:  Cigarettes none.  Alcohol none.  MEDICATIONS:  Neurontin, Microzide, NovoLog, Lantus, lisinopril, Glucophage, and Prilosec.  ALLERGIES:  ASPIRIN, IBUPROFEN, PENICILLIN.  PAST SURGICAL HISTORY:  Left great toe amputation and C-section x4.  REVIEW OF SYSTEMS:  As above.  PHYSICAL EXAMINATION:  VITAL SIGNS:  Temperature 98.3, pulse 84, respirations 14, blood pressure 135/80.  Glucose is 147. GENERAL APPEARANCE:  Well developed, somewhat obese, in no distress. CHEST:  Clear. HEART:  Regular rhythm. EXTREMITIES:  Reveals ABIs were approximately 1 bilaterally.  Good palpable pulses posterior tibial and dorsalis pedis except at the left second toe, there is a 0.5 x 0.9 superficial wound.  The toe was swollen and callused.  The great toe is surgically absent.  IMPRESSION:  Diabetic foot ulcer Wagner 2, left second toe.  PLAN:  Laboratory work to check hemoglobin A1c and sed rate.  X-ray of the toe.  Start treating with Santyl and Hydrogel.  We will see the patient in 7 days.     Elesa Hacker, M.D.     RA/MEDQ  D:  06/17/2014  T:  06/17/2014  Job:  [id number]

## 2014-06-24 ENCOUNTER — Encounter: Payer: Self-pay | Admitting: Internal Medicine

## 2014-07-01 ENCOUNTER — Other Ambulatory Visit: Payer: Self-pay | Admitting: *Deleted

## 2014-07-01 DIAGNOSIS — E119 Type 2 diabetes mellitus without complications: Secondary | ICD-10-CM

## 2014-07-02 MED ORDER — OMEPRAZOLE 20 MG PO CPDR: 40.0000 mg | DELAYED_RELEASE_CAPSULE | Freq: Every day | ORAL | Status: DC

## 2014-07-02 MED ORDER — GABAPENTIN 300 MG PO CAPS
600.0000 mg | ORAL_CAPSULE | Freq: Three times a day (TID) | ORAL | Status: DC
Start: ? — End: 2014-08-28

## 2014-07-02 NOTE — Telephone Encounter (Signed)
I will refill for 30 days. She needs to make an appointment if she wants more refills.

## 2014-07-03 NOTE — Telephone Encounter (Signed)
Dr patel, i sent a note to charsetta for the appt, the gabapentin # should have been 180 for a 30 day supply so i gave that to the health dept pharm, if you will change in the med list, 90 would only be for 15 daystaking 2 - 3times daily Thanks, h.

## 2014-07-03 NOTE — Telephone Encounter (Signed)
Thanks for catching my mistake Bonnita Nasuti!

## 2014-07-08 ENCOUNTER — Encounter: Payer: Self-pay | Admitting: Internal Medicine

## 2014-07-29 ENCOUNTER — Encounter (HOSPITAL_BASED_OUTPATIENT_CLINIC_OR_DEPARTMENT_OTHER): Payer: No Typology Code available for payment source | Attending: General Surgery

## 2014-07-29 DIAGNOSIS — L89899 Pressure ulcer of other site, unspecified stage: Secondary | ICD-10-CM | POA: Insufficient documentation

## 2014-07-29 DIAGNOSIS — L8990 Pressure ulcer of unspecified site, unspecified stage: Secondary | ICD-10-CM | POA: Insufficient documentation

## 2014-08-01 ENCOUNTER — Other Ambulatory Visit: Payer: Self-pay | Admitting: *Deleted

## 2014-08-01 DIAGNOSIS — E119 Type 2 diabetes mellitus without complications: Secondary | ICD-10-CM

## 2014-08-01 NOTE — Telephone Encounter (Signed)
Pt is very upset with me - states she did pick up Rx in Aug and she still is active with [employer]. I suggest she call [employer] and talk to someone there about why we are getting different stories.

## 2014-08-01 NOTE — Telephone Encounter (Signed)
I called into her pharmacy [facility]), and it appears she never picked up her refill from August for gabapentin 600mg  TID (180 tablets) which should've covered her for 30 days. The pharmacist noted that she is no longer on [employer] and may account for why she hasn't picked up this prescription along with her refill of Nexium. She is due next week for an appointment at which time this will need to be addressed.

## 2014-08-05 ENCOUNTER — Other Ambulatory Visit (HOSPITAL_BASED_OUTPATIENT_CLINIC_OR_DEPARTMENT_OTHER): Payer: Self-pay | Admitting: General Surgery

## 2014-08-05 ENCOUNTER — Ambulatory Visit (HOSPITAL_COMMUNITY)
Admission: RE | Admit: 2014-08-05 | Discharge: 2014-08-05 | Disposition: A | Discharge status: Home / Self Care | Payer: No Typology Code available for payment source | Source: Ambulatory Visit | Attending: General Surgery | Admitting: General Surgery

## 2014-08-05 DIAGNOSIS — X58XXXA Exposure to other specified factors, initial encounter: Secondary | ICD-10-CM | POA: Insufficient documentation

## 2014-08-05 DIAGNOSIS — S91109A Unspecified open wound of unspecified toe(s) without damage to nail, initial encounter: Secondary | ICD-10-CM | POA: Insufficient documentation

## 2014-08-05 DIAGNOSIS — M869 Osteomyelitis, unspecified: Secondary | ICD-10-CM

## 2014-08-05 DIAGNOSIS — S98119A Complete traumatic amputation of unspecified great toe, initial encounter: Secondary | ICD-10-CM | POA: Insufficient documentation

## 2014-08-05 NOTE — Telephone Encounter (Signed)
CSW placed call to Ms. Thomas, morning of 08/05/14.  Pt states she called [employer] and clarified miscommunication.  Pt states she was unaware [facility] placed refill request on her behalf and she requested refill as well.   [name] has her medication denies add'l needs.  Has confirmed she is active with [employer] until her re-certification in April 2016.  Pt appreciates and thankful for PCP's concern.

## 2014-08-08 ENCOUNTER — Ambulatory Visit (INDEPENDENT_AMBULATORY_CARE_PROVIDER_SITE_OTHER): Payer: No Typology Code available for payment source | Admitting: Internal Medicine

## 2014-08-08 ENCOUNTER — Encounter: Payer: Self-pay | Admitting: Internal Medicine

## 2014-08-08 VITALS — BP 155/75 | HR 79 | Temp 98.3°F | Ht 73.0 in | Wt 311.6 lb

## 2014-08-08 DIAGNOSIS — Z23 Encounter for immunization: Secondary | ICD-10-CM

## 2014-08-08 DIAGNOSIS — I96 Gangrene, not elsewhere classified: Secondary | ICD-10-CM

## 2014-08-08 DIAGNOSIS — I10 Essential (primary) hypertension: Secondary | ICD-10-CM

## 2014-08-08 DIAGNOSIS — E119 Type 2 diabetes mellitus without complications: Secondary | ICD-10-CM

## 2014-08-08 LAB — BASIC METABOLIC PANEL
BUN: 12 mg/dL (ref 6–23)
CALCIUM: 9.5 mg/dL (ref 8.4–10.5)
CHLORIDE: 102 meq/L (ref 96–112)
CO2: 31 mEq/L (ref 19–32)
CREATININE: 0.57 mg/dL (ref 0.50–1.10)
Glucose, Bld: 141 mg/dL — ABNORMAL HIGH (ref 70–99)
Potassium: 4.2 mEq/L (ref 3.5–5.3)
Sodium: 142 mEq/L (ref 135–145)

## 2014-08-08 LAB — POCT GLYCOSYLATED HEMOGLOBIN (HGB A1C): HEMOGLOBIN A1C: 8.9

## 2014-08-08 LAB — MAGNESIUM: Magnesium: 1.5 mg/dL (ref 1.5–2.5)

## 2014-08-08 LAB — GLUCOSE, CAPILLARY: GLUCOSE-CAPILLARY: 181 mg/dL — AB (ref 70–99)

## 2014-08-08 MED ORDER — SIMVASTATIN 40 MG PO TABS: 40.0000 mg | ORAL_TABLET | Freq: Every day | ORAL | Status: DC

## 2014-08-08 NOTE — Assessment & Plan Note (Signed)
BP Readings from Last 3 Encounters:  08/08/14 155/75  03/03/14 124/82  02/23/14 149/89    Lab Results  Component Value Date   NA 140 02/23/2014   K 4.4 02/23/2014   CREATININE 0.66 02/23/2014    Assessment: Blood pressure control:  Elevated today, 155/75 on repeat. Has been better controlled in the previously. Progress toward BP goal:    Comments: Complaint with HCTZ- 12.5mg , Lisinopril- 10mg  daily  Plan: Medications:  continue current medications Educational resources provided: brochure Self management tools provided:  Blood pressure log Other plans: Mag level today, pt says her gets cramps in her legs and fingers, she stays hydrated and is not on statins. - Bmet today. - Pt advised to check her Bp 2-3 ce a week at [facility], bring Log along next visit.

## 2014-08-08 NOTE — Assessment & Plan Note (Addendum)
Pt has been following up at the wound care center every Tuesday. Pt had an xray done-osteomyelitis of the distal phalanx of the second toe with marked soft tissue swelling of the  second toe. Pt says her wound care doc wants her to follow up with ID, which she has an appointment for 21st of this month. No fever.  She also had ABIs- done- March this year when she was on admission for cellulitis and abscess of this toe, results- WNL.  Plan- Considering findings of Osteomyelitis- Chronic, will refer to orthopedics, bone biopsy would be needed for culture to tailor antibiotic choice. - Will try to contact her wound care doctor, so far unsuccessful attempts to reach him.

## 2014-08-08 NOTE — Progress Notes (Signed)
[name], female   DOB: 12-23-1960, 53 y.o.   MRN: [id number] [facility] Attending Date of visit: 08/08/2014   Case discussed with Dr. Denton Brick at the time of the visit.  We reviewed the resident's history and exam and pertinent patient test results.  I agree with the assessment, diagnosis, and plan of care documented in the resident's note.

## 2014-08-08 NOTE — Assessment & Plan Note (Signed)
Lab Results  Component Value Date   HGBA1C 8.9 08/08/2014   HGBA1C 8.6 01/06/2014   HGBA1C 9.2 08/05/2013     Assessment: Diabetes control:  Uncontrolled Progress toward A1C goal:   Deteriorated Comments: Pt says she has been taking her insulin exactly as prescribed- 60mg  daily, and 15u with meals TID. Pt says she forgot her glucometer but her not check her CBGs in a week, as she travelled to [place]. She has been diabetic for >30years and says when she was in [place] ( [place] to [facility]- 2011), she was on Lantus- 30-35u daily, and 70/30 45u breakfast and 35u dinner. Pt is knowledgeable about her medical conditions and says this was a weird regimen but this worked for her- her HgbA1c- 6-7.   Plan: Medications:  continue current medications Other plans: Pt did not bring glucometer today. Encouraged her to bring it in on her next visit- 2-3 weeks time. Pt will like to meet her PCP. Advised that this might not be possible, immediately. No improvement in Hgba1c, but no log to go by, so no adjustments today.

## 2014-08-08 NOTE — Progress Notes (Signed)
[name], female   DOB: [birth year], 53 y.o.   MRN: [id number]   Subjective:   [name] female   DOB: March 18, 1961 53 y.o.   MRN: [id number]  HPI: [name] is a 53 y.o. with PMH listed below, also with hx of cellulitis and abscess of 2nd toe. Presented today for routine follow up visit.  Past Medical History  Diagnosis Date  . Diabetes mellitus   . Hypertension   . Asthma 11/01/2010   Current Outpatient Prescriptions  Medication Sig Dispense Refill  . acetaminophen (TYLENOL) 500 MG tablet Take 1,000 mg by mouth every 6 (six) hours as needed for moderate pain.      Marland Kitchen doxycycline (VIBRAMYCIN) 50 MG capsule Take 2 capsules (100 mg total) by mouth 2 (two) times daily.  32 capsule  0  . gabapentin (NEURONTIN) 300 MG capsule Take 2 capsules (600 mg total) by mouth 3 (three) times daily.  90 capsule  0  . hydrochlorothiazide (MICROZIDE) 12.5 MG capsule Take 1 capsule (12.5 mg total) by mouth daily.  30 capsule  4  . insulin aspart (NOVOLOG) 100 UNIT/ML injection Inject 15 Units plus correction amount 3 times daily before meals up to a total of 60 Units  60 mL  4  . Insulin Glargine (LANTUS SOLOSTAR) 100 UNIT/ML Solostar Pen Inject 60 Units into the skin at bedtime.  45 mL  4  . lisinopril (PRINIVIL,ZESTRIL) 10 MG tablet Take 10 mg by mouth at bedtime.      . metFORMIN (GLUCOPHAGE) 1000 MG tablet Take 1 tablet (1,000 mg total) by mouth 2 (two) times daily with a meal.  60 tablet  4  . Multiple Vitamin (MULTIVITAMIN WITH MINERALS) TABS tablet Take 1 tablet by mouth daily.      Marland Kitchen omeprazole (PRILOSEC) 20 MG capsule Take 2 capsules (40 mg total) by mouth at bedtime. Acid reflux  60 capsule  0  . simvastatin (ZOCOR) 40 MG tablet Take 1 tablet (40 mg total) by mouth daily.  30 tablet  2  . [DISCONTINUED] benazepril (LOTENSIN) 10 MG tablet Take 10 mg by mouth daily.      . [DISCONTINUED] insulin regular (NOVOLIN R,HUMULIN R) 100 units/mL injection Inject 0.15 mLs  (15 Units total) into the skin 3 (three) times daily before meals.  10 mL  12  . [DISCONTINUED] potassium chloride (K-DUR) 10 MEQ tablet Take 20 mEq by mouth daily.       No current facility-administered medications for this visit.   Family History  Problem Relation Age of Onset  . Diabetes Father   . Diabetes Daughter    History   Social History  . Marital Status: Legally Separated    Spouse Name: N/A    Number of Children: N/A  . Years of Education: N/A   Social History Main Topics  . Smoking status: Never Smoker   . Smokeless tobacco: None  . Alcohol Use: No  . Drug Use: No  . Sexual Activity: None   Other Topics Concern  . None   Social History Narrative  . None   Review of Systems: CONSTITUTIONAL- No Fever, weightloss, night sweat or change in appetite. SKIN- No Rash, colour changes or itching. HEAD- No Headache or dizziness. RESPIRATORY- No Cough or SOB. CARDIAC- No Palpitations, DOE, PND or chest pain. GI- No nausea, vomiting, diarrhoea, constipation, abd pain. URINARY- No Frequency, urgency, straining or dysuria. NEUROLOGIC- Says he legs feel raw, does not feel pain from left toe. [facility]- Denies  depression or anxiety symptoms.  Objective:  Physical Exam: Filed Vitals:   08/08/14 1012 08/08/14 1120  BP: 163/80 155/75  Pulse: 87 79  Temp: 98.3 F (36.8 C)   TempSrc: Oral   Height: 6\' 1"  (1.854 m)   Weight: 311 lb 9.6 oz (141.341 kg)   SpO2: 100%    GENERAL- alert, co-operative, appears as stated age, not in any distress. HEENT- Atraumatic, normocephalic, PERRL, EOMI, oral mucosa appears moist,  neck supple. CARDIAC- RRR, no murmurs, rubs or gallops. RESP- Moving equal volumes of air, and clear to auscultation bilaterally, no wheezes or crackles. ABDOMEN- Soft, nontender, no guarding or rebound, no palpable masses or organomegaly, bowel sounds present. NEURO- No obvious Cr N abnormality, strenght upper and lower extremities- intact, Gait-  Normal. EXTREMITIES- pulse 2+, symmetric, no pedal edema. Left foot- swelling of 2nd toe, appears hyperpigmented but not necrotic, ulcer on dorsal surface- ~1 by~1cm, indurated area, not tender.  SKIN- Warm, dry, No rash or lesion. PSYCH- Normal mood and affect, appropriate thought content and speech.  Assessment & Plan:  The patient's case and plan of care was discussed with attending physician, Dr. Ellwood Dense.  Please see problem based charting for assessment and plan.

## 2014-08-08 NOTE — Patient Instructions (Addendum)
We will like you to come back in 2 weeks with your glucometer and Bp meds to see your PCP.   Also we will be reffering you to Dr Sharol Given as soon as possible for your toe.   We will also be prescribing a medication called Simvastatin for your Cholesterol.   Please bring your medicines with you each time you come to clinic.  Medicines may include prescription medications, over-the-counter medications, herbal remedies, eye drops, vitamins, or other pills.

## 2014-08-18 ENCOUNTER — Ambulatory Visit (INDEPENDENT_AMBULATORY_CARE_PROVIDER_SITE_OTHER): Payer: No Typology Code available for payment source | Admitting: Internal Medicine

## 2014-08-18 ENCOUNTER — Encounter: Payer: Self-pay | Admitting: Internal Medicine

## 2014-08-18 VITALS — BP 124/79 | HR 92 | Temp 97.7°F | Wt 303.0 lb

## 2014-08-18 DIAGNOSIS — M9080 Osteopathy in diseases classified elsewhere, unspecified site: Secondary | ICD-10-CM

## 2014-08-18 DIAGNOSIS — M869 Osteomyelitis, unspecified: Secondary | ICD-10-CM

## 2014-08-18 DIAGNOSIS — E1169 Type 2 diabetes mellitus with other specified complication: Secondary | ICD-10-CM

## 2014-08-18 MED ORDER — DOXYCYCLINE HYCLATE 100 MG PO TABS: 100.0000 mg | ORAL_TABLET | Freq: Two times a day (BID) | ORAL | Status: DC

## 2014-08-18 NOTE — Progress Notes (Signed)
Subjective:    [name], female    DOB: [birth year], 53 y.o.   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 In spring 2015, she received IV antibiotics and switched to oral doxycycline for a few weeks. She has been at wound care getting HBO therapy without significant improvement. Ulcer on her foot does not having signficant drainage. She recently had xray of her left foot that showed signs of osteomyelitis to distal phalynx of 2nd toe. She has been off of antibiotics for numerous months.   Allergies  Allergen Reactions  . Aspirin Nausea And Vomiting  . Ibuprofen Nausea And Vomiting and Other (See Comments)    Abdominal pain  . Penicillins Other (See Comments)    unknown     Current Outpatient Prescriptions on File Prior to Visit  Medication Sig Dispense Refill  . acetaminophen (TYLENOL) 500 MG tablet Take 1,000 mg by mouth every 6 (six) hours as needed for moderate pain.      Marland Kitchen gabapentin (NEURONTIN) 300 MG capsule Take 2 capsules (600 mg total) by mouth 3 (three) times daily.  90 capsule  0  . hydrochlorothiazide (MICROZIDE) 12.5 MG capsule Take 1 capsule (12.5 mg total) by mouth daily.  30 capsule  4  . insulin aspart (NOVOLOG) 100 UNIT/ML injection Inject 15 Units plus correction amount 3 times daily before meals up to a total of 60 Units  60 mL  4  . Insulin Glargine (LANTUS SOLOSTAR) 100 UNIT/ML Solostar Pen Inject 60 Units into the skin at bedtime.  45 mL  4  . lisinopril (PRINIVIL,ZESTRIL) 10 MG tablet Take 10 mg by mouth at bedtime.      . metFORMIN (GLUCOPHAGE) 1000 MG tablet Take 1 tablet (1,000 mg total) by mouth 2 (two) times daily with a meal.  60 tablet  4  . Multiple Vitamin (MULTIVITAMIN WITH MINERALS) TABS tablet Take 1 tablet by mouth daily.      Marland Kitchen omeprazole (PRILOSEC) 20 MG capsule Take 2 capsules (40 mg total) by mouth at bedtime. Acid reflux   60 capsule  0  . simvastatin (ZOCOR) 40 MG tablet Take 1 tablet (40 mg total) by mouth daily.  30 tablet  2  . [DISCONTINUED] benazepril (LOTENSIN) 10 MG tablet Take 10 mg by mouth daily.      . [DISCONTINUED] insulin regular (NOVOLIN R,HUMULIN R) 100 units/mL injection Inject 0.15 mLs (15 Units total) into the skin 3 (three) times daily before meals.  10 mL  12  . [DISCONTINUED] potassium chloride (K-DUR) 10 MEQ tablet Take 20 mEq by mouth daily.       No current facility-administered medications on file prior to visit.   Active Ambulatory Problems    Diagnosis Date Noted  . DIABETES MELLITUS, TYPE II 11/28/1988  . DIABETIC PERIPHERAL NEUROPATHY 11/01/2010  . HYPERTENSION, BENIGN ESSENTIAL 11/28/2008  . ASTHMA 11/01/2010  . Osteomyelitis of toe of left foot 02/19/2013  . Vertigo 03/07/2013  . Wrist pain 04/11/2013  . Elevated TSH 05/13/2013  . Diabetic ulcer of left foot 09/16/2013  . Health care maintenance 01/06/2014  . Gangrene and abscess of left 2nd toe 02/20/2014  . Cellulitis of second toe, left 02/20/2014  . GERD (gastroesophageal reflux disease) 03/03/2014   Resolved Ambulatory Problems    Diagnosis Date Noted  . DIABETIC FOOT ULCER, TOE 11/01/2010  . Gastroenteritis 02/18/2013  . Acute kidney injury  02/18/2013  . Dehydration 02/18/2013  . Chronic osteomyelitis of foot 02/19/2013  . DM type 2 with diabetic foot ulcer 02/19/2013   Past Medical History  Diagnosis Date  . Diabetes mellitus   . Hypertension   . Asthma 11/01/2010      Review of Systems  Constitutional: Negative for fever, chills, diaphoresis, activity change, appetite change, fatigue and unexpected weight change.  HENT: Negative for congestion, sore throat, rhinorrhea, sneezing, trouble swallowing and sinus pressure.  Eyes: Negative for photophobia and visual disturbance.  Respiratory: Negative for cough, chest tightness, shortness of breath, wheezing and stridor.  Cardiovascular: Negative for  chest pain, palpitations and leg swelling.  Gastrointestinal: Negative for nausea, vomiting, abdominal pain, diarrhea, constipation, blood in stool, abdominal distention and anal bleeding.  Genitourinary: Negative for dysuria, hematuria, flank pain and difficulty urinating.  Musculoskeletal: Negative for myalgias, back pain, joint swelling, arthralgias and gait problem.  Skin: Negative for color change, pallor, rash and wound.  Neurological: Negative for dizziness, tremors, weakness and light-headedness.  Hematological: Negative for adenopathy. Does not bruise/bleed easily.  Psychiatric/Behavioral: Negative for behavioral problems, confusion, sleep disturbance, dysphoric mood, decreased concentration and agitation.       Objective:   Physical Exam BP 124/79  Pulse 92  Temp(Src) 97.7 F (36.5 C) (Oral)  Wt 303 lb (137.44 kg)  LMP 06/23/2014 Physical Exam  Constitutional:  oriented to person, place, and time. appears well-developed and well-nourished. No distress.  Neurological: alert and oriented to person, place, and time.  Skin: small ulcer 1.2cm circular lesion necrotic center on 2nd digit distal aspect of left foot,  Ext: amputation of great toe of left foot        Assessment & Plan:  Diabetic foot osteomyelitis = will start doxycycline 100mg  bid as well as have her go see dr. Sharol Given for assessment. She will probably benefit from surgical debridement /amputation.   rtc  In 4 wk

## 2014-08-28 ENCOUNTER — Encounter (HOSPITAL_COMMUNITY): Payer: Self-pay

## 2014-08-28 ENCOUNTER — Other Ambulatory Visit (HOSPITAL_COMMUNITY): Payer: Self-pay | Admitting: Orthopedic Surgery

## 2014-08-28 ENCOUNTER — Encounter (HOSPITAL_COMMUNITY): Payer: Self-pay | Admitting: *Deleted

## 2014-08-28 NOTE — Progress Notes (Signed)
08/28/14 1834  OBSTRUCTIVE SLEEP APNEA  Have you ever been diagnosed with sleep apnea through a sleep study? No  Do you snore loudly (loud enough to be heard through closed doors)?  0  Do you often feel tired, fatigued, or sleepy during the daytime? 1  Has anyone observed you stop breathing during your sleep? 0  Do you have, or are you being treated for high blood pressure? 1  BMI more than 35 kg/m2? 1  Age over 52 years old? 1  Gender: 0  Obstructive Sleep Apnea Score 4  Score 4 or greater  Results sent to PCP

## 2014-08-29 ENCOUNTER — Encounter (HOSPITAL_COMMUNITY): Payer: Self-pay | Admitting: Anesthesiology

## 2014-08-29 ENCOUNTER — Encounter (HOSPITAL_COMMUNITY)
Admission: RE | Disposition: A | Discharge status: Home / Self Care | Payer: Self-pay | Source: Ambulatory Visit | Attending: Orthopedic Surgery

## 2014-08-29 ENCOUNTER — Ambulatory Visit (HOSPITAL_COMMUNITY)
Admission: RE | Admit: 2014-08-29 | Discharge: 2014-08-29 | Disposition: A | Discharge status: Home / Self Care | Payer: Self-pay | Source: Ambulatory Visit | Attending: Orthopedic Surgery | Admitting: Orthopedic Surgery

## 2014-08-29 ENCOUNTER — Encounter (HOSPITAL_COMMUNITY): Payer: Self-pay | Admitting: *Deleted

## 2014-08-29 ENCOUNTER — Ambulatory Visit: Payer: No Typology Code available for payment source | Admitting: Internal Medicine

## 2014-08-29 ENCOUNTER — Ambulatory Visit (HOSPITAL_COMMUNITY): Payer: Self-pay

## 2014-08-29 ENCOUNTER — Ambulatory Visit (HOSPITAL_COMMUNITY): Payer: No Typology Code available for payment source | Admitting: Anesthesiology

## 2014-08-29 DIAGNOSIS — J45909 Unspecified asthma, uncomplicated: Secondary | ICD-10-CM | POA: Insufficient documentation

## 2014-08-29 DIAGNOSIS — M6702 Short Achilles tendon (acquired), left ankle: Secondary | ICD-10-CM | POA: Insufficient documentation

## 2014-08-29 DIAGNOSIS — I10 Essential (primary) hypertension: Secondary | ICD-10-CM | POA: Insufficient documentation

## 2014-08-29 DIAGNOSIS — D649 Anemia, unspecified: Secondary | ICD-10-CM | POA: Insufficient documentation

## 2014-08-29 DIAGNOSIS — M869 Osteomyelitis, unspecified: Secondary | ICD-10-CM | POA: Insufficient documentation

## 2014-08-29 DIAGNOSIS — K219 Gastro-esophageal reflux disease without esophagitis: Secondary | ICD-10-CM | POA: Insufficient documentation

## 2014-08-29 DIAGNOSIS — E1140 Type 2 diabetes mellitus with diabetic neuropathy, unspecified: Secondary | ICD-10-CM | POA: Insufficient documentation

## 2014-08-29 HISTORY — DX: Gastro-esophageal reflux disease without esophagitis: K21.9

## 2014-08-29 HISTORY — PX: ACHILLES TENDON LENGTHENING: SHX6455

## 2014-08-29 HISTORY — DX: Pneumonia, unspecified organism: J18.9

## 2014-08-29 HISTORY — DX: Osteomyelitis, unspecified: M86.9

## 2014-08-29 HISTORY — DX: Anemia, unspecified: D64.9

## 2014-08-29 HISTORY — PX: AMPUTATION: SHX166

## 2014-08-29 LAB — COMPREHENSIVE METABOLIC PANEL
ALT: 27 U/L (ref 0–35)
AST: 25 U/L (ref 0–37)
Albumin: 3.9 g/dL (ref 3.5–5.2)
Alkaline Phosphatase: 111 U/L (ref 39–117)
Anion gap: 13 (ref 5–15)
BUN: 14 mg/dL (ref 6–23)
CO2: 24 mEq/L (ref 19–32)
Calcium: 9.9 mg/dL (ref 8.4–10.5)
Chloride: 99 mEq/L (ref 96–112)
Creatinine, Ser: 0.55 mg/dL (ref 0.50–1.10)
GFR calc Af Amer: >90 mL/min (ref >90)
GFR calc non Af Amer: >90 mL/min (ref >90)
Glucose, Bld: 184 mg/dL — ABNORMAL HIGH (ref 70–99)
Potassium: 5.2 mEq/L (ref 3.7–5.3)
Sodium: 136 mEq/L — ABNORMAL LOW (ref 137–147)
Total Bilirubin: 0.3 mg/dL (ref 0.3–1.2)
Total Protein: 8.5 g/dL — ABNORMAL HIGH (ref 6.0–8.3)

## 2014-08-29 LAB — GLUCOSE, CAPILLARY
GLUCOSE-CAPILLARY: 154 mg/dL — AB (ref 70–99)
Glucose-Capillary: 160 mg/dL — ABNORMAL HIGH (ref 70–99)
Glucose-Capillary: 141 mg/dL — ABNORMAL HIGH (ref 70–99)

## 2014-08-29 LAB — CBC
HCT: 40.6 % (ref 36.0–46.0)
Hemoglobin: 13.4 g/dL (ref 12.0–15.0)
MCH: 31.5 pg (ref 26.0–34.0)
MCHC: 33 g/dL (ref 30.0–36.0)
MCV: 95.5 fL (ref 78.0–100.0)
PLATELETS: 227 10*3/uL (ref 150–400)
RBC: 4.25 MIL/uL (ref 3.87–5.11)
RDW: 13 % (ref 11.5–15.5)
WBC: 6 10*3/uL (ref 4.0–10.5)

## 2014-08-29 LAB — PROTIME-INR
INR: 0.94 (ref 0.00–1.49)
Prothrombin Time: 12.6 seconds (ref 11.6–15.2)

## 2014-08-29 LAB — APTT: aPTT: 20 seconds — ABNORMAL LOW (ref 24–37)

## 2014-08-29 SURGERY — AMPUTATION DIGIT
Anesthesia: General | Site: Foot | Laterality: Left

## 2014-08-29 MED ORDER — MIDAZOLAM HCL 2 MG/2ML IJ SOLN
INTRAMUSCULAR | Status: AC
  Filled 2014-08-29: qty 2

## 2014-08-29 MED ORDER — FENTANYL CITRATE 0.05 MG/ML IJ SOLN
INTRAMUSCULAR | Status: DC | PRN
  Administered 2014-08-29 (×2): 50 ug via INTRAVENOUS

## 2014-08-29 MED ORDER — PROPOFOL 10 MG/ML IV BOLUS
INTRAVENOUS | Status: DC | PRN
  Administered 2014-08-29: 200 mg via INTRAVENOUS

## 2014-08-29 MED ORDER — HYDROMORPHONE HCL 1 MG/ML IJ SOLN
INTRAMUSCULAR | Status: AC
  Filled 2014-08-29: qty 1

## 2014-08-29 MED ORDER — LIDOCAINE HCL (CARDIAC) 20 MG/ML IV SOLN
INTRAVENOUS | Status: DC | PRN
  Administered 2014-08-29: 70 mg via INTRAVENOUS

## 2014-08-29 MED ORDER — FENTANYL CITRATE 0.05 MG/ML IJ SOLN
INTRAMUSCULAR | Status: AC
  Filled 2014-08-29: qty 5

## 2014-08-29 MED ORDER — LACTATED RINGERS IV SOLN
INTRAVENOUS | Status: DC
  Administered 2014-08-29 (×2): via INTRAVENOUS

## 2014-08-29 MED ORDER — HYDROCODONE-ACETAMINOPHEN 5-325 MG PO TABS: 1.0000 | ORAL_TABLET | Freq: Four times a day (QID) | ORAL | Status: DC | PRN

## 2014-08-29 MED ORDER — 0.9 % SODIUM CHLORIDE (POUR BTL) OPTIME
TOPICAL | Status: DC | PRN
  Administered 2014-08-29: 1000 mL

## 2014-08-29 MED ORDER — PROPOFOL 10 MG/ML IV BOLUS
INTRAVENOUS | Status: AC
  Filled 2014-08-29: qty 20

## 2014-08-29 MED ORDER — MIDAZOLAM HCL 5 MG/5ML IJ SOLN
INTRAMUSCULAR | Status: DC | PRN
  Administered 2014-08-29: 2 mg via INTRAVENOUS

## 2014-08-29 MED ORDER — CLINDAMYCIN PHOSPHATE 900 MG/50ML IV SOLN
900.0000 mg | INTRAVENOUS | Status: AC
  Administered 2014-08-29: 900 mg via INTRAVENOUS
  Filled 2014-08-29: qty 50

## 2014-08-29 MED ORDER — HYDROMORPHONE HCL 1 MG/ML IJ SOLN
0.2500 mg | INTRAMUSCULAR | Status: DC | PRN
  Administered 2014-08-29: 0.25 mg via INTRAVENOUS

## 2014-08-29 SURGICAL SUPPLY — 36 items
BNDG COHESIVE 4X5 TAN STRL (GAUZE/BANDAGES/DRESSINGS) ×3 IMPLANT
BNDG ESMARK 4X9 LF (GAUZE/BANDAGES/DRESSINGS) IMPLANT
BNDG GAUZE ELAST 4 BULKY (GAUZE/BANDAGES/DRESSINGS) ×3 IMPLANT
COVER SURGICAL LIGHT HANDLE (MISCELLANEOUS) ×3 IMPLANT
DRAPE U-SHAPE 47X51 STRL (DRAPES) ×3 IMPLANT
DRSG ADAPTIC 3X8 NADH LF (GAUZE/BANDAGES/DRESSINGS) ×3 IMPLANT
DRSG PAD ABDOMINAL 8X10 ST (GAUZE/BANDAGES/DRESSINGS) ×3 IMPLANT
DURAPREP 26ML APPLICATOR (WOUND CARE) ×3 IMPLANT
ELECT REM PT RETURN 9FT ADLT (ELECTROSURGICAL) ×3
ELECTRODE REM PT RTRN 9FT ADLT (ELECTROSURGICAL) ×2 IMPLANT
GAUZE SPONGE 4X4 12PLY STRL (GAUZE/BANDAGES/DRESSINGS) IMPLANT
GLOVE BIOGEL PI IND STRL 6.5 (GLOVE) ×2 IMPLANT
GLOVE BIOGEL PI IND STRL 7.5 (GLOVE) ×2 IMPLANT
GLOVE BIOGEL PI IND STRL 9 (GLOVE) ×2 IMPLANT
GLOVE BIOGEL PI INDICATOR 6.5 (GLOVE) ×1
GLOVE BIOGEL PI INDICATOR 7.5 (GLOVE) ×1
GLOVE BIOGEL PI INDICATOR 9 (GLOVE) ×1
GLOVE BIOGEL PI ORTHO PRO SZ7 (GLOVE) ×1
GLOVE ECLIPSE 7.5 STRL STRAW (GLOVE) ×3 IMPLANT
GLOVE PI ORTHO PRO STRL SZ7 (GLOVE) ×2 IMPLANT
GLOVE SURG ORTHO 9.0 STRL STRW (GLOVE) ×3 IMPLANT
GOWN STRL REUS W/ TWL XL LVL3 (GOWN DISPOSABLE) ×6 IMPLANT
GOWN STRL REUS W/TWL XL LVL3 (GOWN DISPOSABLE) ×3
KIT BASIN OR (CUSTOM PROCEDURE TRAY) ×3 IMPLANT
KIT ROOM TURNOVER OR (KITS) ×3 IMPLANT
MANIFOLD NEPTUNE II (INSTRUMENTS) ×3 IMPLANT
NEEDLE 22X1 1/2 (OR ONLY) (NEEDLE) IMPLANT
NS IRRIG 1000ML POUR BTL (IV SOLUTION) ×3 IMPLANT
PACK ORTHO EXTREMITY (CUSTOM PROCEDURE TRAY) ×3 IMPLANT
PAD ARMBOARD 7.5X6 YLW CONV (MISCELLANEOUS) ×6 IMPLANT
SUCTION FRAZIER TIP 10 FR DISP (SUCTIONS) ×3 IMPLANT
SUT ETHILON 2 0 FS 18 (SUTURE) ×3 IMPLANT
SUT ETHILON 2 0 PSLX (SUTURE) ×3 IMPLANT
SYR CONTROL 10ML LL (SYRINGE) IMPLANT
TOWEL OR 17X24 6PK STRL BLUE (TOWEL DISPOSABLE) ×3 IMPLANT
TOWEL OR 17X26 10 PK STRL BLUE (TOWEL DISPOSABLE) ×3 IMPLANT

## 2014-08-29 NOTE — Anesthesia Preprocedure Evaluation (Addendum)
Anesthesia Evaluation  Patient identified by MRN, date of birth, ID band Patient awake    Reviewed: Allergy & Precautions, H&P , Patient's Chart, lab work & pertinent test results  Airway Mallampati: II      Dental   Pulmonary asthma , pneumonia -,  breath sounds clear to auscultation        Cardiovascular hypertension, Rhythm:Regular Rate:Abnormal     Neuro/Psych    GI/Hepatic GERD-  ,  Endo/Other  diabetes  Renal/GU      Musculoskeletal   Abdominal   Peds  Hematology   Anesthesia Other Findings   Reproductive/Obstetrics                          Anesthesia Physical Anesthesia Plan  ASA: III  Anesthesia Plan: General   Post-op Pain Management:    Induction: Intravenous  Airway Management Planned: LMA  Additional Equipment:   Intra-op Plan:   Post-operative Plan: Extubation in OR  Informed Consent: I have reviewed the patients History and Physical, chart, labs and discussed the procedure including the risks, benefits and alternatives for the proposed anesthesia with the patient or authorized representative who has indicated his/her understanding and acceptance.   Dental advisory given  Plan Discussed with: CRNA and Anesthesiologist  Anesthesia Plan Comments:         Anesthesia Quick Evaluation

## 2014-08-29 NOTE — Anesthesia Procedure Notes (Signed)
Procedure Name: LMA Insertion Date/Time: 08/29/2014 1:40 PM Performed by: Maude Leriche D Pre-anesthesia Checklist: Patient identified, Emergency Drugs available, Suction available, Patient being monitored and Timeout performed Patient Re-evaluated:Patient Re-evaluated prior to inductionOxygen Delivery Method: Circle system utilized Preoxygenation: Pre-oxygenation with 100% oxygen Intubation Type: IV induction Ventilation: Mask ventilation without difficulty LMA: LMA inserted LMA Size: 5.0 Number of attempts: 1 Placement Confirmation: positive ETCO2 and breath sounds checked- equal and bilateral Tube secured with: Tape Dental Injury: Teeth and Oropharynx as per pre-operative assessment

## 2014-08-29 NOTE — Op Note (Signed)
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 female with a diagnosis of Osteomyelitis Left Foot 2nd Toe, Heel Cord Contracture who failed conservative measures and elected for surgical management.    The risks benefits and alternatives were discussed with the patient preoperatively including but not limited to the risks of infection, bleeding, nerve injury, cardiopulmonary complications, the need for revision surgery, among others, and the patient was willing to proceed.  OPERATIVE IMPLANTS: none  OPERATIVE FINDINGS: Good petechial bleeding  OPERATIVE PROCEDURE: Patient is a 53 year old woman with diabetic insensate neuropathy status post partial amputation great toe who presents with chronic osteomyelitis of the second toe left foot. Patient has severe heel cord contracture as well. After failure conservative treatment patient presents at this time for surgical intervention.  Description of procedure patient was brought to the operating room and underwent a general anesthetic. After adequate levels of anesthesia were obtained patient's left lower extremity was prepped using DuraPrep draped into a sterile field. A fishmouth incision was made around the second and first MTP joint. The first and second toe amputated through the MTP joint. Hemostasis was obtained. Wound was irrigated with normal saline. Incision was closed using 2-0 nylon. Attention was then focused on the Achilles. A Z-lengthening procedure was performed on the Achilles. The range of motion and was taken from a contracture of 20 of equinus with neutral to dorsiflexion about 20. Patient did have good  continuity of the Achilles after the Z-lengthening procedure. The patient's leg was wrapped with a sterile compressive dressing. Patient was extubated taken to the PACU in stable condition. Plan for discharge to home. Followup in the office in one week. Minimize weightbearing. Prescription for Vicodin for pain.

## 2014-08-29 NOTE — Transfer of Care (Signed)
Immediate Anesthesia Transfer of Care Note  Patient: [name]  Procedure(s) Performed: Procedure(s): Left Foot 2nd and 1st Toe Amputation (Left) Left Achilles Lengthening (Left)  Patient Location: PACU  Anesthesia Type:General  Level of Consciousness: sedated  Airway & Oxygen Therapy: Patient Spontanous Breathing and Patient connected to face mask oxygen  Post-op Assessment: Report given to PACU RN and Post -op Vital signs reviewed and stable  Post vital signs: Reviewed and stable  Complications: No apparent anesthesia complications

## 2014-08-29 NOTE — H&P (Signed)
[name] is an 53 y.o. female.   Chief Complaint: Left foot second toe osteomyelitis HPI: Patient is a 53 year old woman with diabetic insensate neuropathy who is status post partial amputation of the left great toe and presents at this time with osteomyelitis left foot second toe. Patient is undergone prolonged conservative wound care.  Past Medical History  Diagnosis Date  . Diabetes mellitus   . Hypertension   . Asthma 11/01/2010  . Pneumonia     as a child  . GERD (gastroesophageal reflux disease)   . Anemia   . Osteomyelitis     Past Surgical History  Procedure Laterality Date  . Amputation Left 02/19/2013    Procedure: Amputation of Left Great Toe;  Surgeon: Mcarthur Rossetti, MD;  Location: [facility];  Service: Orthopedics;  Laterality: Left;  . Cesarean section      x 4  . Tubal ligation      Family History  Problem Relation Age of Onset  . Diabetes Father   . Hypertension Father   . Diabetes Daughter   . Hypertension Mother   . Dementia Mother    Social History:  reports that she has never smoked. She has never used smokeless tobacco. She reports that she does not drink alcohol or use illicit drugs.  Allergies:  Allergies  Allergen Reactions  . Aspirin Nausea And Vomiting  . Ibuprofen Nausea And Vomiting and Other (See Comments)    Abdominal pain  . Penicillins Other (See Comments)    unknown    No prescriptions prior to admission    No results found for this or any previous visit (from the past 48 hour(s)). No results found.  Review of Systems  All other systems reviewed and are negative.   Last menstrual period 06/23/2014. Physical Exam  On examination patient has a palpable pulse. She has osteomyelitis ulceration of the left foot second toe. Assessment/Plan Assessment: Osteomyelitis ulceration left foot second toe.  Plan: Will plan for amputation of the residual great toe on the left as well as the second toe through the MTP joint. Risks  and benefits were discussed including risk of the wound not healing. Patient states she understands and wished to proceed at this time.  Alaric Gladwin V 08/29/2014, 6:35 AM

## 2014-08-29 NOTE — Discharge Instructions (Signed)

## 2014-08-29 NOTE — Progress Notes (Signed)
Orthopedic Tech Progress Note Patient Details:  [name] 08-Dec-1960 [id number] Applied in PACU. Crutches set to patient's height. Patient states she has used crutches before and does not require crutch training at this time.  Ortho Devices Type of Ortho Device: CAM walker;Crutches Ortho Device/Splint Location: LLE Ortho Device/Splint Interventions: Application   Asia R Thompson 08/29/2014, 2:29 PM

## 2014-08-29 NOTE — Anesthesia Postprocedure Evaluation (Signed)
  Anesthesia Post-op Note  Patient: [name]  Procedure(s) Performed: Procedure(s): Left Foot 2nd and 1st Toe Amputation (Left) Left Achilles Lengthening (Left)  Patient Location: PACU  Anesthesia Type:General  Level of Consciousness: awake  Airway and Oxygen Therapy: Patient Spontanous Breathing  Post-op Pain: mild  Post-op Assessment: Post-op Vital signs reviewed  Post-op Vital Signs: Reviewed  Last Vitals:  Filed Vitals:   08/29/14 1546  BP:   Pulse: 80  Temp:   Resp: 14    Complications: No apparent anesthesia complications

## 2014-09-01 ENCOUNTER — Encounter (HOSPITAL_COMMUNITY): Payer: Self-pay | Admitting: Orthopedic Surgery

## 2014-09-10 ENCOUNTER — Other Ambulatory Visit: Payer: Self-pay | Admitting: Internal Medicine

## 2014-09-15 ENCOUNTER — Other Ambulatory Visit: Payer: Self-pay | Admitting: *Deleted

## 2014-09-15 DIAGNOSIS — I10 Essential (primary) hypertension: Secondary | ICD-10-CM

## 2014-09-17 MED ORDER — GABAPENTIN 300 MG PO CAPS: 600.0000 mg | ORAL_CAPSULE | Freq: Three times a day (TID) | ORAL | Status: DC

## 2014-09-17 NOTE — Telephone Encounter (Signed)
I have refilled for 30-day supply and will have patient follow-up with Dr. Gordy Levan next week for further dose adjustment. It appears the last time I refilled, it was for 600mg  TID NOT 900mg  TID and thus have corrected the dosage on the refill. I suspect it may have been altered during her recent hospitalization.

## 2014-09-23 ENCOUNTER — Encounter: Payer: Self-pay | Admitting: Internal Medicine

## 2014-09-23 ENCOUNTER — Other Ambulatory Visit: Payer: Self-pay | Admitting: *Deleted

## 2014-09-23 ENCOUNTER — Ambulatory Visit (INDEPENDENT_AMBULATORY_CARE_PROVIDER_SITE_OTHER): Payer: Self-pay | Admitting: Internal Medicine

## 2014-09-23 VITALS — BP 117/66 | HR 89 | Temp 98.0°F | Ht 73.0 in | Wt 303.9 lb

## 2014-09-23 DIAGNOSIS — E118 Type 2 diabetes mellitus with unspecified complications: Secondary | ICD-10-CM

## 2014-09-23 DIAGNOSIS — IMO0002 Reserved for concepts with insufficient information to code with codable children: Secondary | ICD-10-CM

## 2014-09-23 DIAGNOSIS — E1165 Type 2 diabetes mellitus with hyperglycemia: Secondary | ICD-10-CM

## 2014-09-23 DIAGNOSIS — I10 Essential (primary) hypertension: Secondary | ICD-10-CM

## 2014-09-23 DIAGNOSIS — I96 Gangrene, not elsewhere classified: Secondary | ICD-10-CM

## 2014-09-23 MED ORDER — HYDROCHLOROTHIAZIDE 12.5 MG PO CAPS: 12.5000 mg | ORAL_CAPSULE | Freq: Every day | ORAL | Status: DC

## 2014-09-23 MED ORDER — "INSULIN SYRINGE-NEEDLE U-100 31G X 15/64"" 0.5 ML MISC": 35.0000 [IU] | Freq: Two times a day (BID) | Status: DC

## 2014-09-23 MED ORDER — INSULIN ASPART PROT & ASPART (70-30 MIX) 100 UNIT/ML PEN: 35.0000 [IU] | PEN_INJECTOR | Freq: Two times a day (BID) | SUBCUTANEOUS | Status: DC

## 2014-09-23 MED ORDER — LISINOPRIL 10 MG PO TABS: 10.0000 mg | ORAL_TABLET | Freq: Every day | ORAL | Status: DC

## 2014-09-23 MED ORDER — OMEPRAZOLE 20 MG PO CPDR: 40.0000 mg | DELAYED_RELEASE_CAPSULE | Freq: Every day | ORAL | Status: DC

## 2014-09-23 NOTE — Assessment & Plan Note (Signed)
Pt had recent amputation of the left foot 2nd and 1st toe amputation with left achilles lengthening by Dr. Sharol Given.  She reports going to the wound care center and is scheduled to follow up with Dr. Sharol Given in the next few weeks.  She complains that her left leg is still swollen from the surgery and Dr. Sharol Given is aware.  I urged her to speak with Dr. Jess Barters nurse if she is concerned but she has already done so and he told her it was part of the healing process.   -follow up with Dr. Sharol Given and wound care

## 2014-09-23 NOTE — Progress Notes (Signed)
[name], female   DOB: [birth year], 53 y.o.MRN: [id number]    Subjective:   [name] female    DOB: Mar 30, 1961 53 y.o.    MRN: [id number] Health Maintenance Due: Health Maintenance Due  Topic Date Due  . Colonoscopy  03/12/2012  . Ophthalmology Exam  03/12/2014  . Pap Smear  03/15/2014  . Foot Exam  05/20/2014  . Lipid Panel  08/05/2014    _________________________________________________  HPI: [name] is a 53 y.o. female here for a routine follow up visit for blood pressure.  Pt has a PMH outlined below.  Please see problem-based charting assessment and plan note for further details of medical issues addressed at today's visit.  PMH: Past Medical History  Diagnosis Date  . Diabetes mellitus   . Hypertension   . Asthma 11/01/2010  . Pneumonia     as a child  . GERD (gastroesophageal reflux disease)   . Anemia   . Osteomyelitis     Medications: Current Outpatient Prescriptions on File Prior to Visit  Medication Sig Dispense Refill  . acetaminophen (TYLENOL) 500 MG tablet Take 1,000 mg by mouth every 6 (six) hours as needed for moderate pain.      Marland Kitchen doxycycline (VIBRA-TABS) 100 MG tablet Take 1 tablet (100 mg total) by mouth 2 (two) times daily.  60 tablet  0  . gabapentin (NEURONTIN) 300 MG capsule Take 2 capsules (600 mg total) by mouth 3 (three) times daily.  180 capsule  0  . hydrochlorothiazide (MICROZIDE) 12.5 MG capsule Take 1 capsule (12.5 mg total) by mouth daily.  30 capsule  4  . HYDROcodone-acetaminophen (NORCO) 5-325 MG per tablet Take 1 tablet by mouth every 6 (six) hours as needed.  30 tablet  0  . lisinopril (PRINIVIL,ZESTRIL) 10 MG tablet Take 10 mg by mouth at bedtime.      . metFORMIN (GLUCOPHAGE) 1000 MG tablet Take 1 tablet (1,000 mg total) by mouth 2 (two) times daily with a meal.  60 tablet  4  . Multiple Vitamin (MULTIVITAMIN WITH MINERALS) TABS tablet Take 1 tablet by mouth daily.      Marland Kitchen omeprazole  (PRILOSEC) 20 MG capsule Take 2 capsules (40 mg total) by mouth at bedtime. Acid reflux  60 capsule  0  . simvastatin (ZOCOR) 40 MG tablet Take 1 tablet (40 mg total) by mouth daily.  30 tablet  2  . [DISCONTINUED] benazepril (LOTENSIN) 10 MG tablet Take 10 mg by mouth daily.      . [DISCONTINUED] insulin regular (NOVOLIN R,HUMULIN R) 100 units/mL injection Inject 0.15 mLs (15 Units total) into the skin 3 (three) times daily before meals.  10 mL  12  . [DISCONTINUED] potassium chloride (K-DUR) 10 MEQ tablet Take 20 mEq by mouth daily.       No current facility-administered medications on file prior to visit.    Allergies: Allergies  Allergen Reactions  . Aspirin Nausea And Vomiting  . Ibuprofen Nausea And Vomiting and Other (See Comments)    Abdominal pain  . Penicillins Other (See Comments)    unknown    FH: Family History  Problem Relation Age of Onset  . Diabetes Father   . Hypertension Father   . Diabetes Daughter   . Hypertension Mother   . Dementia Mother     SH: History   Social History  . Marital Status: Legally Separated    Spouse Name: N/A    Number of Children: N/A  . Years  of Education: N/A   Social History Main Topics  . Smoking status: Never Smoker   . Smokeless tobacco: Never Used  . Alcohol Use: No  . Drug Use: No  . Sexual Activity: None   Other Topics Concern  . None   Social History Narrative  . None    Review of Systems: Constitutional: Negative for fever, chills and weight loss.  Eyes: Negative for blurred vision.  Respiratory: Negative for cough and shortness of breath.  Cardiovascular: Negative for chest pain, palpitations and leg swelling.  Gastrointestinal: Negative for nausea, vomiting, abdominal pain, diarrhea, constipation and blood in stool.  Genitourinary: Negative for dysuria, urgency and frequency.  Musculoskeletal: Negative for myalgias and back pain.  Neurological: Negative for dizziness, weakness and headaches.       Objective:   Vital Signs: Filed Vitals:   09/23/14 1034  BP: 117/66  Pulse: 89  Temp: 98 F (36.7 C)  TempSrc: Oral  Height: 6\' 1"  (1.854 m)  Weight: 303 lb 14.4 oz (137.848 kg)  SpO2: 100%      BP Readings from Last 3 Encounters:  09/23/14 117/66  08/29/14 133/75  08/29/14 133/75    Physical Exam: Constitutional: Vital signs reviewed.  Patient is well-developed and well-nourished in NAD and cooperative with exam.  Head: Normocephalic and atraumatic. Eyes: PERRL, EOMI, conjunctivae nl, no scleral icterus.  Neck: Supple. Cardiovascular: RRR, no MRG. Pulmonary/Chest: normal effort, non-tender to palpation, CTAB, no wheezes, rales, or rhonchi. Abdominal: Soft. NT/ND +BS. Musculoskeletal: Full range ofmotion. no pain,edema,or deformity.  Nocyanosis,clubbing,oredema. Neurological: A&O x3, cranial nerves II-XII are grossly intact, moving all extremities. Extremities: 2+DP b/l; no pitting edema. Skin: Warm, dry and intact. No rash.  Most Recent Laboratory Results:  CMP     Component Value Date/Time   NA 136* 08/29/2014 1033   K 5.2 08/29/2014 1033   CL 99 08/29/2014 1033   CO2 24 08/29/2014 1033   GLUCOSE 184* 08/29/2014 1033   BUN 14 08/29/2014 1033   CREATININE 0.55 08/29/2014 1033   CREATININE 0.57 08/08/2014 1138   CALCIUM 9.9 08/29/2014 1033   PROT 8.5* 08/29/2014 1033   ALBUMIN 3.9 08/29/2014 1033   AST 25 08/29/2014 1033   ALT 27 08/29/2014 1033   ALKPHOS 111 08/29/2014 1033   BILITOT 0.3 08/29/2014 1033   GFRNONAA >90 08/29/2014 1033   GFRNONAA >89 01/06/2014 1643   GFRAA >90 08/29/2014 1033   GFRAA >89 01/06/2014 1643    CBC    Component Value Date/Time   WBC 6.0 08/29/2014 1033   RBC 4.25 08/29/2014 1033   HGB 13.4 08/29/2014 1033   HCT 40.6 08/29/2014 1033   PLT 227 08/29/2014 1033   MCV 95.5 08/29/2014 1033   MCH 31.5 08/29/2014 1033   MCHC 33.0 08/29/2014 1033   RDW 13.0 08/29/2014 1033   LYMPHSABS 3.1 02/20/2014 1644   MONOABS 0.2 02/20/2014 1644   EOSABS  0.1 02/20/2014 1644   BASOSABS 0.0 02/20/2014 1644    Lipid Panel Lab Results  Component Value Date   CHOL 215* 08/05/2013   HDL 69 08/05/2013   LDLCALC 128* 08/05/2013   TRIG 91 08/05/2013   CHOLHDL 3.1 08/05/2013    HA1C Lab Results  Component Value Date   HGBA1C 8.9 08/08/2014    Urinalysis    Component Value Date/Time   COLORURINE YELLOW 02/18/2013 0600   APPEARANCEUR CLOUDY* 02/18/2013 0600   LABSPEC 1.011 02/18/2013 0600   PHURINE 5.5 02/18/2013 0600   GLUCOSEU 500* 02/18/2013 0600  HGBUR NEGATIVE 02/18/2013 0600   HGBUR large 11/01/2010 1509   BILIRUBINUR NEGATIVE 02/18/2013 0600   KETONESUR 15* 02/18/2013 0600   PROTEINUR NEGATIVE 02/18/2013 0600   UROBILINOGEN 0.2 02/18/2013 0600   NITRITE NEGATIVE 02/18/2013 0600   LEUKOCYTESUR TRACE* 02/18/2013 0600    Urine Microalbumin Lab Results  Component Value Date   MICROALBUR 6.87* 02/28/2013    Imaging N/A   Assessment & Plan:   Assessment and plan was discussed and formulated with my attending.

## 2014-09-23 NOTE — Assessment & Plan Note (Signed)
Pt is upset with her diabetes regimen of novolog and lantus.  She states she has gained a lot of weight since being put on novolog.  She used to used 70/30 when in [place] and that worked better for her.  She was able to meet with [facility] today and we discussed her insulin regimen and would like to switch back to 70/30. -d/c novolog -start novolog 70/30 35 units in the AM and 35 units in the PM -further adjustments at next OV-->also used to take a small dose of lantus in the PM which should be discussed at next OV -lipid panel today -follow up on 12/11

## 2014-09-23 NOTE — Assessment & Plan Note (Addendum)
BP good control at 117/66. -continue current meds

## 2014-09-23 NOTE — Telephone Encounter (Signed)
As this patient was seen by Dr. Gordy Levan on 10/27 and deemed necessary for their treatment as documented in the EHR, I will refill HCTZ, lisinopril, and Prilosec.

## 2014-09-23 NOTE — Patient Instructions (Signed)
Thank you for your visit today.   Please return to the internal medicine clinic in 1 month(s) or sooner if needed.     I have made the following additions/changes to your medications:  1. Discontinue novolog and lantus 2. Begin taking novolog 70/30 35 units twice daily with meals.  3. We will make adjustments if we need to at your next visit   Please be sure to bring all of your medications with you to every visit; this includes herbal supplements, vitamins, eye drops, and any over-the-counter medications.   Should you have any questions regarding your medications and/or any new or worsening symptoms, please be sure to call the clinic at [phone].   If you believe that you are suffering from a life threatening condition or one that may result in the loss of limb or function, then you should call 911 or proceed to the nearest Emergency Department.     A healthy lifestyle and preventative care can promote health and wellness.   Maintain regular health, dental, and eye exams.  Eat a healthy diet. Foods like vegetables, fruits, whole grains, low-fat dairy products, and lean protein foods contain the nutrients you need without too many calories. Decrease your intake of foods high in solid fats, added sugars, and salt. Get information about a proper diet from your caregiver, if necessary.  Regular physical exercise is one of the most important things you can do for your health. Most adults should get at least 150 minutes of moderate-intensity exercise (any activity that increases your heart rate and causes you to sweat) each week. In addition, most adults need muscle-strengthening exercises on 2 or more days a week.   Maintain a healthy weight. The body mass index (BMI) is a screening tool to identify possible weight problems. It provides an estimate of body fat based on height and weight. Your caregiver can help determine your BMI, and can help you achieve or maintain a healthy weight.  For adults 20 years and older:  A BMI below 18.5 is considered underweight.  A BMI of 18.5 to 24.9 is normal.  A BMI of 25 to 29.9 is considered overweight.  A BMI of 30 and above is considered obese.

## 2014-09-24 ENCOUNTER — Encounter: Payer: Self-pay | Admitting: Internal Medicine

## 2014-09-24 NOTE — Progress Notes (Signed)
[name], female   DOB: 01-20-1961, 53 y.o.   MRN: [id number]  I received a summary of her most recent visit to Dr. Sharol Given at [facility] on 10/15. She was using peroxide and ointment on her most recent toe amputation which has resulted in deterioration of her wound. She has also been using it despite being told to keep it non-weightbearing. Her daughter and pastor were present at the visit and also advised to help her adhere to this advice.  She is to follow-up on 10/22 at which time her foot sutures will be removed.

## 2014-09-26 NOTE — Progress Notes (Signed)
Internal Medicine Clinic Attending  Case discussed with Dr. Gill soon after the resident saw the patient.  We reviewed the resident's history and exam and pertinent patient test results.  I agree with the assessment, diagnosis, and plan of care documented in the resident's note.  

## 2014-09-30 ENCOUNTER — Encounter (HOSPITAL_BASED_OUTPATIENT_CLINIC_OR_DEPARTMENT_OTHER): Payer: Self-pay | Attending: General Surgery

## 2014-10-01 ENCOUNTER — Ambulatory Visit: Payer: Self-pay | Admitting: Internal Medicine

## 2014-11-07 ENCOUNTER — Encounter: Payer: Self-pay | Admitting: Internal Medicine

## 2014-11-14 ENCOUNTER — Encounter: Payer: Self-pay | Admitting: Internal Medicine

## 2014-11-14 NOTE — Progress Notes (Signed)
[name], female   DOB: [birth year], 53 y.o.   MRN: [id number]  I have reviewed the office notes from Dr. Sharol Given  for the office visit dated 08/28/14:  -XR with findings consistent with L foot 2nd toe osteomyelitis  -Plan to amputate and Z-lengthening of the Achilles  I will reiterate these updates to the patient at their next scheduled visit.

## 2014-12-24 ENCOUNTER — Encounter: Payer: Self-pay | Admitting: Internal Medicine

## 2015-01-14 ENCOUNTER — Telehealth: Payer: Self-pay | Admitting: *Deleted

## 2015-01-14 NOTE — Telephone Encounter (Signed)
I am confused. Did she pick up a prescription on 11/10/14, prescription dated February 2015?  Is there a way that if I called in the prescription for insulin to the pharmacy, they could give it to her contingent that she  updates her contact information?

## 2015-01-14 NOTE — Telephone Encounter (Signed)
Per pharmacy pt has not had insulin in appr 2 months, she last picked it up 12/14 and it was from a script from 12/2013, today her daughter presented to pharm with a script from 08/2014 to be filled, i have tried to call her at all ph#'s listed, 2 have been disconnected and 1 has a strange answering message on it. i was also given a different ph# from pharm and it states the subscriber is not taking calls.   Please advise on what should be done at this point

## 2015-01-15 NOTE — Telephone Encounter (Signed)
Spoke w/ dr patel, pt will be told at the health dept to call dr for appt asap

## 2015-01-30 NOTE — Progress Notes (Signed)
This encounter was created in error - please disregard.  This encounter was created in error - please disregard.

## 2015-02-09 ENCOUNTER — Telehealth: Payer: Self-pay | Admitting: *Deleted

## 2015-02-09 NOTE — Telephone Encounter (Signed)
Call from [facility] stating pt is in there pharmacy.  She is reading a note to call clinic as soon as pt arrives.   The request was from 2/17. Pt is taking other peoples insulin.  Now taking Lantus and Novolog 70/30. Pt given an appointment in clinic tomorrow to review meds.

## 2015-02-10 ENCOUNTER — Telehealth: Payer: Self-pay | Admitting: *Deleted

## 2015-02-10 ENCOUNTER — Ambulatory Visit (INDEPENDENT_AMBULATORY_CARE_PROVIDER_SITE_OTHER): Payer: Self-pay | Admitting: Internal Medicine

## 2015-02-10 ENCOUNTER — Encounter: Payer: Self-pay | Admitting: Internal Medicine

## 2015-02-10 VITALS — BP 134/73 | HR 86 | Temp 98.0°F | Ht 73.0 in | Wt 302.3 lb

## 2015-02-10 DIAGNOSIS — E118 Type 2 diabetes mellitus with unspecified complications: Secondary | ICD-10-CM

## 2015-02-10 DIAGNOSIS — IMO0002 Reserved for concepts with insufficient information to code with codable children: Secondary | ICD-10-CM

## 2015-02-10 DIAGNOSIS — I10 Essential (primary) hypertension: Secondary | ICD-10-CM

## 2015-02-10 DIAGNOSIS — E1165 Type 2 diabetes mellitus with hyperglycemia: Secondary | ICD-10-CM

## 2015-02-10 LAB — GLUCOSE, CAPILLARY
Glucose-Capillary: 60 mg/dL — ABNORMAL LOW (ref 70–99)
Glucose-Capillary: 107 mg/dL — ABNORMAL HIGH (ref 70–99)
Glucose-Capillary: 61 mg/dL — ABNORMAL LOW (ref 70–99)

## 2015-02-10 LAB — POCT GLYCOSYLATED HEMOGLOBIN (HGB A1C): Hemoglobin A1C: 9

## 2015-02-10 MED ORDER — HYDROCHLOROTHIAZIDE 12.5 MG PO CAPS: 12.5000 mg | ORAL_CAPSULE | Freq: Every day | ORAL | Status: DC

## 2015-02-10 MED ORDER — INSULIN ASPART 100 UNIT/ML ~~LOC~~ SOLN: SUBCUTANEOUS | Status: DC

## 2015-02-10 MED ORDER — METFORMIN HCL 1000 MG PO TABS: 1000.0000 mg | ORAL_TABLET | Freq: Two times a day (BID) | ORAL | Status: DC

## 2015-02-10 MED ORDER — INSULIN GLARGINE 100 UNIT/ML ~~LOC~~ SOLN: 50.0000 [IU] | Freq: Every day | SUBCUTANEOUS | Status: DC

## 2015-02-10 MED ORDER — SIMVASTATIN 40 MG PO TABS: 40.0000 mg | ORAL_TABLET | Freq: Every evening | ORAL | Status: DC

## 2015-02-10 MED ORDER — LISINOPRIL 10 MG PO TABS: 10.0000 mg | ORAL_TABLET | Freq: Every day | ORAL | Status: DC

## 2015-02-10 NOTE — Patient Instructions (Addendum)
General Instructions:  Please bring your medicines with you each time you come to clinic.  Medicines may include prescription medications, over-the-counter medications, herbal remedies, eye drops, vitamins, or other pills.  Dear [name],  Thank you for coming in today.   Your low blood sugars are definitely concerning and we need to try to prevent those while controlling your blood sugar better.   Please DECREASE your Lantus to 50 units at night with dinner and DECREASE your Novolog to 10 units with breakfast and dinner but NONE at lunch since you are usually skipping that meal.   Please try not to skip any meals if possible and check your blood sugar regularly. At least three times a day and morning and night for the next week so we can have a better idea of your blood sugars and adjust your insulin accordingly.   Please follow up with your pcp or in clinic in the next 1-2 weeks so we can further adjust your insulin.   DO NOT TAKE BOTH INSULIN 70/30 AND LANTUS. Take ONLY Lantus for now and the novolog for meal coverage.   Progress Toward Treatment Goals:  Treatment Goal 02/10/2015  Hemoglobin A1C unchanged  Blood pressure at goal    Self Care Goals & Plans:  Self Care Goal 02/10/2015  Manage my medications take my medicines as prescribed; bring my medications to every visit; refill my medications on time  Monitor my health bring my glucose meter and log to each visit; keep track of my blood glucose; keep track of my blood pressure  Eat healthy foods eat more vegetables; eat foods that are low in salt; eat baked foods instead of fried foods  Be physically active find an activity I enjoy; take a walk every day  Meeting treatment goals -    Home Blood Glucose Monitoring 02/10/2015  Check my blood sugar 3 times a day  When to check my blood sugar before meals     Care Management & Community Referrals:  Referral 05/13/2013  Referrals made for care management support diabetes  educator    Low Blood Sugar Low blood sugar (hypoglycemia) means that the level of sugar in your blood is lower than it should be. Signs of low blood sugar include:  Getting sweaty.  Feeling hungry.  Feeling dizzy or weak.  Feeling sleepier than normal.  Feeling nervous.  Headaches.  Having a fast heartbeat. Low blood sugar can happen fast and can be an emergency. Your doctor can do tests to check your blood sugar level. You can have low blood sugar and not have diabetes. HOME CARE  Check your blood sugar as told by your doctor. If it is less than 70 mg/dl or as told by your doctor, take 1 of the following:  3 to 4 glucose tablets.   cup clear juice.   cup soda pop, not diet.  1 cup milk.  5 to 6 hard candies.  Recheck blood sugar after 15 minutes. Repeat until it is at the right level.  Eat a snack if it is more than 1 hour until the next meal.  Only take medicine as told by your doctor.  Do not skip meals. Eat on time.  Do not drink alcohol except with meals.  Check your blood glucose before driving.  Check your blood glucose before and after exercise.  Always carry treatment with you, such as glucose pills.  Always wear a medical alert bracelet if you have diabetes. GET HELP RIGHT AWAY IF:  Your blood glucose goes below 70 mg/dl or as told by your doctor, and you:  Are confused.  Are not able to swallow.  Pass out (faint).  You cannot treat yourself. You may need someone to help you.  You have low blood sugar problems often.  You have problems from your medicines.  You are not feeling better after 3 to 4 days.  You have vision changes. MAKE SURE YOU:   Understand these instructions.  Will watch this condition.  Will get help right away if you are not doing well or get worse. Document Released: 02/08/2010 Document Revised: 02/06/2012 Document Reviewed: 02/08/2010 [facility] Patient Information 2015 [place], [place]. This information is  not intended to replace advice given to you by your health care provider. Make sure you discuss any questions you have with your health care provider.

## 2015-02-10 NOTE — Assessment & Plan Note (Addendum)
Lab Results  Component Value Date   HGBA1C 9.0 02/10/2015   HGBA1C 8.9 08/08/2014   HGBA1C 8.6 01/06/2014    Assessment: Diabetes control: poor control (HgbA1C >9%) Progress toward A1C goal:  unchanged Comments: taking different insulin than prescribed. Did not start 70/30 due to losing prescription but then found it again. But instead is currently on lantus 60 units and novolog 15 units tid and having lows including cbg of 60 in clinic today  Plan: Medications:  given her low blood sugars but elevated a1c and no meter today, adjusting her regimen is difficult. will decrease lantus to 50 units qhs and change novolog to 10 units only with breakfast and dinner. Hold if not eating meals and cancel lunch time dose given that she frequently misses that meal. Hopefully this will prevent lows. If she continues to have lows after meals, she is advised to d/c novolog, call opc, and return sooner for follow up visit Home glucose monitoring: Frequency: 3 times a day Timing: before meals Instruction/counseling given: reminded to bring blood glucose meter & log to each visit, reminded to bring medications to each visit and discussed diet Educational resources provided:   Self management tools provided:   Other plans: She is asked to check her blood sugar three times a day and breakfast and dinner for the next week and bring all her medications and meter to follow up visit in 1-2 weeks. She will also benefit from CDE and continued education, however, she will have to agree to meet with CDE. It seems she prefers to be on 70/30 and may be appropriate for her given that she does not like taking novolog separately, however, currently with her low blood sugars I would not recommend making that transition today. Instead, she will need to return and with review of her cbg's and hopefully stopping of her lows, she can then be transitioned with a safe dose.   I counseled her on hypo and hyperglycemia signs and  symptoms and also provided literature on hypoglycemia and how to correct. She is advised to always keep food in her bag and glucose tablets near her bed.   I will route this not to PCP who can hopefully see her on follow up visit and can also continue discussion about diabetic control and possible CDE involvement as well with patient. I will defer change of insulin therapy to PCP.   I will also ask CDE to try to contact patient this week to see if her hypoglycemic episodes have decreased at all.

## 2015-02-10 NOTE — Assessment & Plan Note (Addendum)
BP Readings from Last 3 Encounters:  02/10/15 134/73  09/23/14 117/66  08/29/14 133/75   Lab Results  Component Value Date   NA 136* 08/29/2014   K 5.2 08/29/2014   CREATININE 0.55 08/29/2014   Assessment: Blood pressure control: controlled Progress toward BP goal:  at goal  Plan: Medications:  continue current medications lisinopril 10mg  daily and hctz 12.5mg  daily, refill sent to pharmacy today

## 2015-02-10 NOTE — Progress Notes (Signed)
Subjective:   [name] female   DOB: [birth year] 54 y.o.   MRN: [id number]  HPI: [name] is a 54 y.o. female with uncontrolled DM2 and other PMH as listed below presenting to [facility] today for acute visit of medication and diabetes check. She [facility] trying to get her medications and was asked to call the clinic for an appointment and was scheduled for today.   DM2--was supposed to be on 70/30 35 units bid, however, she says she lost that prescription given by Dr. Gordy Levan and instead has been using Lantus 60 units qhs around 10pm and novolog 15 units TID with meals and occasional sliding scale as well.  She says she then found her 70/30 prescription (which she prefers to take) and tried to get it filled and thinks that is why she was asked to come in. She was unable to have the 70/30 filled. Of note, her cbg was 60 today and finally corrected to 107 in opc. She admits to occasional lows at home, mainly when she skips meals or goes a long time without eating. She has had lows at night before but less frequently and has not been for at least one month. She would prefer not having to take novolog and just take 70/30 to make it easier she says. She also admits to not following a diabetic diet and eats regular food.   She unfortunately did not bring her meter with her today. She reports her CBG was 136 this morning and maybe 120s yesterday but is "all over the place" generally. She says it is usually highest at night with dinner being her biggest meal of the day, usually skips lunch, and does have breakfast.   She also reports being on tramadol and requests refill for her chronic pain. She will discuss this with PCP.   Past Medical History  Diagnosis Date  . Diabetes mellitus   . Hypertension   . Asthma 11/01/2010  . Pneumonia     as a child  . GERD (gastroesophageal reflux disease)   . Anemia   . Osteomyelitis    Current Outpatient  Prescriptions  Medication Sig Dispense Refill  . gabapentin (NEURONTIN) 300 MG capsule Take 2 capsules (600 mg total) by mouth 3 (three) times daily. 180 capsule 0  . hydrochlorothiazide (MICROZIDE) 12.5 MG capsule Take 1 capsule (12.5 mg total) by mouth daily. 90 capsule 1  . Insulin Syringe-Needle U-100 31G X 15/64" 0.5 ML MISC Inject 35 Units into the skin 2 (two) times daily with a meal. 100 each 3  . lisinopril (PRINIVIL,ZESTRIL) 10 MG tablet Take 1 tablet (10 mg total) by mouth at bedtime. 90 tablet 1  . Multiple Vitamin (MULTIVITAMIN WITH MINERALS) TABS tablet Take 1 tablet by mouth daily.    Marland Kitchen omeprazole (PRILOSEC) 20 MG capsule Take 2 capsules (40 mg total) by mouth at bedtime. Acid reflux 180 capsule 1  . acetaminophen (TYLENOL) 500 MG tablet Take 1,000 mg by mouth every 6 (six) hours as needed for moderate pain.    Marland Kitchen insulin aspart (NOVOLOG) 100 UNIT/ML injection Take 10 units with breakfast and dinner. HOLD if skipping meals. 10 mL 0  . insulin glargine (LANTUS) 100 UNIT/ML injection Inject 0.5 mLs (50 Units total) into the skin at bedtime. 10 mL 0  . metFORMIN (GLUCOPHAGE) 1000 MG tablet Take 1 tablet (1,000 mg total) by mouth 2 (two) times daily with a meal. 60 tablet 1  . simvastatin (ZOCOR)  40 MG tablet Take 1 tablet (40 mg total) by mouth every evening. 30 tablet 1  . [DISCONTINUED] benazepril (LOTENSIN) 10 MG tablet Take 10 mg by mouth daily.    . [DISCONTINUED] insulin regular (NOVOLIN R,HUMULIN R) 100 units/mL injection Inject 0.15 mLs (15 Units total) into the skin 3 (three) times daily before meals. 10 mL 12  . [DISCONTINUED] potassium chloride (K-DUR) 10 MEQ tablet Take 20 mEq by mouth daily.     No current facility-administered medications for this visit.   Family History  Problem Relation Age of Onset  . Diabetes Father   . Hypertension Father   . Diabetes Daughter   . Hypertension Mother   . Dementia Mother    History   Social History  . Marital Status: Legally  Separated    Spouse Name: N/A  . Number of Children: N/A  . Years of Education: N/A   Social History Main Topics  . Smoking status: Never Smoker   . Smokeless tobacco: Never Used  . Alcohol Use: No  . Drug Use: No  . Sexual Activity: Not on file   Other Topics Concern  . None   Social History Narrative   Review of Systems:  Constitutional:  Denies fever, chills  Respiratory:  Denies SOB  Cardiovascular:  Denies chest pain   Musculoskeletal:  Chronic pain   Objective:  Physical Exam: Filed Vitals:   02/10/15 1619  BP: 134/73  Pulse: 86  Temp: 98 F (36.7 C)  TempSrc: Oral  Height: 6\' 1"  (1.854 m)  Weight: 302 lb 4.8 oz (137.122 kg)  SpO2: 100%   Vitals reviewed. General: sitting in chair, NAD HEENT: EOMI Cardiac: RRR Pulm: clear to auscultation bilaterally, no wheezes, rales, or rhonchi Abd: soft, BS present Ext: moving all extremities Neuro: alert and oriented X3  Assessment & Plan:  Discussed with Dr. Eppie Gibson

## 2015-02-10 NOTE — Telephone Encounter (Signed)
Prescription for Zocor 40 mg tablets 1 po every evening called to the [facility] line.  Dispense # 30 with 1 refill per order of Dr. Eula Fried.  Sander Nephew, RN 02/10/2015 5:43 PM.

## 2015-02-11 NOTE — Progress Notes (Signed)
Case discussed with Dr. Qureshi soon after the resident saw the patient.  We reviewed the resident's history and exam and pertinent patient test results.  I agree with the assessment, diagnosis, and plan of care documented in the resident's note. 

## 2015-02-16 ENCOUNTER — Telehealth: Payer: Self-pay | Admitting: Internal Medicine

## 2015-02-16 NOTE — Telephone Encounter (Signed)
I called the patient this evening to speak with her with regards to her tramadol. She reported to me that she was taking tramadol for her leg pain. It is difficult for her to perform her day-to-day tasks and she would normally require 1-2 tablets when she was taking the tramadol. She does not feel that her pain has worsened since she was on the tramadol.   She does not report using any other pain medications from other providers and is comfortable with me referencing the [facility] to confirm. Per review of the [facility], she last received tramadol 50 mg 60 tabs on 09/26/2014 from Dr. Sharol Given.  She reports that she is due to follow-up in 1-2 weeks. Given that I feel she has an indication for her pain medication and no red flags as noted on the [facility] or by previous providers, I'm comfortable with having this patient sign a pain contract with our clinic. She is currently scheduled to follow-up on 3/29 though she told me that that date may not work for her. I will wait for her to confirm a time after which I will contact a provider who will see her to discuss this.

## 2015-02-17 NOTE — Telephone Encounter (Signed)
Rushil I will forward this to Quita Skye as he will be seeing her on the 29th.   Thanks,  [employer]

## 2015-02-24 ENCOUNTER — Ambulatory Visit: Payer: Self-pay | Admitting: Internal Medicine

## 2015-02-25 ENCOUNTER — Telehealth: Payer: Self-pay | Admitting: Internal Medicine

## 2015-02-25 NOTE — Telephone Encounter (Signed)
Call to patient to confirm appointment for 02/26/15 at 8:45 lmtcb

## 2015-02-26 ENCOUNTER — Ambulatory Visit: Payer: Self-pay | Admitting: Internal Medicine

## 2015-02-26 ENCOUNTER — Encounter: Payer: Self-pay | Admitting: Internal Medicine

## 2015-04-16 ENCOUNTER — Other Ambulatory Visit: Payer: Self-pay | Admitting: *Deleted

## 2015-04-16 DIAGNOSIS — IMO0002 Reserved for concepts with insufficient information to code with codable children: Secondary | ICD-10-CM

## 2015-04-16 DIAGNOSIS — E1165 Type 2 diabetes mellitus with hyperglycemia: Secondary | ICD-10-CM

## 2015-04-16 DIAGNOSIS — E118 Type 2 diabetes mellitus with unspecified complications: Principal | ICD-10-CM

## 2015-04-16 NOTE — Telephone Encounter (Signed)
Refill is also for Lantus solostar 50 units at bedtime.  Not on med list??  Do you want to reorder?

## 2015-04-17 NOTE — Telephone Encounter (Signed)
As this patient was seen by Dr. Eula Fried on 02/10/15 and deemed necessary for their treatment as documented in the EHR, I would be willing to refill this prescription for Lantus 50u QHS. She was to transition to Novolog 70/30 out of affordability and thus will need to know that she is willing to pay for the Lantus since she is asking for a refill. She was hypoglycemic as noted in her last office visit and will need to have blood sugar readings for [name] to review at an upcoming visit.   Edd Fabian, can you please call her and ask what she is currently taking, whether she is checking her blood sugars, and when she could follow-up in clinic? It's very hard for [name] to make changes to her regimen without seeing how she is doing.

## 2015-04-17 NOTE — Telephone Encounter (Signed)
Tried to call pt but the phone # we have does not accept incoming calls.... I have another # and was able to leave a message for her to return my call.  Not sure if it is her's.

## 2015-04-17 NOTE — Telephone Encounter (Signed)
I finally was able to get in touch with the pt. She is taking Lantus 70 units at night and Novolog 15 units with meals. Her sugars are up and down. She will come in next week for an office visit.  She has not been seen as she can not afford the co pay.

## 2015-04-17 NOTE — Telephone Encounter (Addendum)
I'm not sure why she is taking Lantus 70u QHS and may be contributing to her lows. As we have no baseline off which to dose her insulin, I'm hesitant to just prescribe it. Additionally, it costs more than 70/30. If she can make it to her appointment next week, I'd be in favor of starting 70/30.  I spoke to her by phone, and she informed me that she has insulin at home to last her for early next week, Monday or Tuesday. She would prefer transitioning to 70/30 and will attempt to make an appointment on Monday. I told her to call us if she is going to run out before she can be seen in the clinic, so she can at least see how she is doing on 70/30. She reports to me that her sugars tend to run in the mid 100s to 200s. Additionally, the phone number listed in her chart as her home number is sometimes, so she reports that it is okay to contact either one of her daughters should we need to send her message.

## 2015-04-20 MED ORDER — INSULIN NPH ISOPHANE & REGULAR (70-30) 100 UNIT/ML ~~LOC~~ SUSP: 35.0000 [IU] | Freq: Two times a day (BID) | SUBCUTANEOUS | Status: DC

## 2015-04-20 NOTE — Telephone Encounter (Addendum)
As she is scheduled to be seen later today, I have refilled her Novolog 70/30 35 units twice daily per her office visit with Dr. Gordy Levan back in 09/23/14 to be picked up at [facility].

## 2015-04-20 NOTE — Telephone Encounter (Signed)
Front desk to schedule appointment

## 2015-04-20 NOTE — Addendum Note (Signed)
Addended by: Riccardo Dubin on: 04/20/2015 01:58 PM   Modules accepted: Orders, Medications

## 2015-04-22 ENCOUNTER — Telehealth: Payer: Self-pay | Admitting: Internal Medicine

## 2015-04-22 NOTE — Telephone Encounter (Signed)
Call to patient to confirm appointment for 04/23/15 at 3:45 phone doesn't accept incoming calls

## 2015-04-23 ENCOUNTER — Encounter: Payer: Self-pay | Admitting: Internal Medicine

## 2015-04-23 ENCOUNTER — Ambulatory Visit (INDEPENDENT_AMBULATORY_CARE_PROVIDER_SITE_OTHER): Payer: Self-pay | Admitting: Internal Medicine

## 2015-04-23 VITALS — BP 153/69 | HR 89 | Temp 98.2°F | Ht 73.0 in | Wt 309.2 lb

## 2015-04-23 DIAGNOSIS — IMO0002 Reserved for concepts with insufficient information to code with codable children: Secondary | ICD-10-CM

## 2015-04-23 DIAGNOSIS — I10 Essential (primary) hypertension: Secondary | ICD-10-CM

## 2015-04-23 DIAGNOSIS — E118 Type 2 diabetes mellitus with unspecified complications: Principal | ICD-10-CM

## 2015-04-23 DIAGNOSIS — E1165 Type 2 diabetes mellitus with hyperglycemia: Secondary | ICD-10-CM

## 2015-04-23 DIAGNOSIS — Z794 Long term (current) use of insulin: Secondary | ICD-10-CM

## 2015-04-23 DIAGNOSIS — Z760 Encounter for issue of repeat prescription: Secondary | ICD-10-CM

## 2015-04-23 LAB — BASIC METABOLIC PANEL
BUN: 13 mg/dL (ref 6–23)
CO2: 25 meq/L (ref 19–32)
CREATININE: 0.67 mg/dL (ref 0.50–1.10)
Calcium: 9.7 mg/dL (ref 8.4–10.5)
Chloride: 105 mEq/L (ref 96–112)
GLUCOSE: 126 mg/dL — AB (ref 70–99)
POTASSIUM: 4 meq/L (ref 3.5–5.3)
Sodium: 142 mEq/L (ref 135–145)

## 2015-04-23 LAB — GLUCOSE, CAPILLARY: GLUCOSE-CAPILLARY: 110 mg/dL — AB (ref 65–99)

## 2015-04-23 MED ORDER — LISINOPRIL 10 MG PO TABS: 10.0000 mg | ORAL_TABLET | Freq: Every day | ORAL | Status: DC

## 2015-04-23 MED ORDER — HYDROCHLOROTHIAZIDE 12.5 MG PO CAPS: 12.5000 mg | ORAL_CAPSULE | Freq: Every day | ORAL | Status: DC

## 2015-04-23 MED ORDER — METFORMIN HCL 1000 MG PO TABS: 1000.0000 mg | ORAL_TABLET | Freq: Two times a day (BID) | ORAL | Status: DC

## 2015-04-23 MED ORDER — ATORVASTATIN CALCIUM 40 MG PO TABS: 40.0000 mg | ORAL_TABLET | Freq: Every day | ORAL | Status: DC

## 2015-04-23 MED ORDER — TRAMADOL HCL 50 MG PO TABS: 50.0000 mg | ORAL_TABLET | Freq: Four times a day (QID) | ORAL | Status: DC | PRN

## 2015-04-23 MED ORDER — INSULIN NPH ISOPHANE & REGULAR (70-30) 100 UNIT/ML ~~LOC~~ SUSP: 35.0000 [IU] | Freq: Two times a day (BID) | SUBCUTANEOUS | Status: DC

## 2015-04-23 MED ORDER — INSULIN NPH ISOPHANE & REGULAR (70-30) 100 UNIT/ML ~~LOC~~ SUSP: 55.0000 [IU] | Freq: Two times a day (BID) | SUBCUTANEOUS | Status: DC

## 2015-04-23 MED ORDER — AGAMATRIX PRESTO W/DEVICE KIT: 1.0000 | PACK | Freq: Once | Status: DC

## 2015-04-23 NOTE — Progress Notes (Signed)
Subjective:   [name] female   DOB: [birth year] 54 y.o.   MRN: [id number]  HPI: [name] is a 54 y.o. woman pmh as listed below presents for DM recheck.   DM - Patient checking blood sugars 1-3 times daily, before breakfast and at meals. She doesn't bring in her meter for review today.  Currently taking Lantus 70 units qhs and regular insulin 15 units TIDWC. At least 3 hypoglycemic per month. denies polyuria, polydipsia, nausea, vomiting, diarrhea.  does request refills today.  Hypertension ROS: no medication side effects noted, no TIA's, no chest pain on exertion, no dyspnea on exertion and no swelling of ankles.  New concerns: pt didn't have her HCTZ and has not been taking it.   Past Medical History  Diagnosis Date  . Diabetes mellitus   . Hypertension   . Asthma 11/01/2010  . Pneumonia     as a child  . GERD (gastroesophageal reflux disease)   . Anemia   . Osteomyelitis    Current Outpatient Prescriptions  Medication Sig Dispense Refill  . acetaminophen (TYLENOL) 500 MG tablet Take 1,000 mg by mouth every 6 (six) hours as needed for moderate pain.    Marland Kitchen gabapentin (NEURONTIN) 300 MG capsule Take 2 capsules (600 mg total) by mouth 3 (three) times daily. 180 capsule 0  . hydrochlorothiazide (MICROZIDE) 12.5 MG capsule Take 1 capsule (12.5 mg total) by mouth daily. 90 capsule 1  . insulin NPH-regular Human (NOVOLIN 70/30) (70-30) 100 UNIT/ML injection Inject 35 Units into the skin 2 (two) times daily with a meal. 10 mL 0  . Insulin Syringe-Needle U-100 31G X 15/64" 0.5 ML MISC Inject 35 Units into the skin 2 (two) times daily with a meal. 100 each 3  . lisinopril (PRINIVIL,ZESTRIL) 10 MG tablet Take 1 tablet (10 mg total) by mouth at bedtime. 90 tablet 1  . metFORMIN (GLUCOPHAGE) 1000 MG tablet Take 1 tablet (1,000 mg total) by mouth 2 (two) times daily with a meal. 60 tablet 1  . Multiple Vitamin (MULTIVITAMIN WITH MINERALS) TABS tablet Take 1 tablet by  mouth daily.    Marland Kitchen omeprazole (PRILOSEC) 20 MG capsule Take 2 capsules (40 mg total) by mouth at bedtime. Acid reflux 180 capsule 1  . simvastatin (ZOCOR) 40 MG tablet Take 1 tablet (40 mg total) by mouth every evening. 30 tablet 1  . [DISCONTINUED] benazepril (LOTENSIN) 10 MG tablet Take 10 mg by mouth daily.    . [DISCONTINUED] insulin regular (NOVOLIN R,HUMULIN R) 100 units/mL injection Inject 0.15 mLs (15 Units total) into the skin 3 (three) times daily before meals. 10 mL 12  . [DISCONTINUED] potassium chloride (K-DUR) 10 MEQ tablet Take 20 mEq by mouth daily.     No current facility-administered medications for this visit.   Family History  Problem Relation Age of Onset  . Diabetes Father   . Hypertension Father   . Diabetes Daughter   . Hypertension Mother   . Dementia Mother    History   Social History  . Marital Status: Legally Separated    Spouse Name: N/A  . Number of Children: N/A  . Years of Education: N/A   Social History Main Topics  . Smoking status: Never Smoker   . Smokeless tobacco: Never Used  . Alcohol Use: No  . Drug Use: No  . Sexual Activity: Not on file   Other Topics Concern  . None   Social History Narrative   Review of Systems: Pertinent  items are noted in HPI. Objective:  Physical Exam: Filed Vitals:   04/23/15 1602  BP: 153/69  Pulse: 89  Temp: 98.2 F (36.8 C)  TempSrc: Oral  Height: 6\' 1"  (1.854 m)  Weight: 309 lb 3.2 oz (140.252 kg)  SpO2: 100%   General: NAD Cardiac: RRR, no rubs, murmurs or gallops Pulm: clear to auscultation bilaterally, moving normal volumes of air Ext: warm and well perfused, no pedal edema  Assessment & Plan:  Please see problem oriented charting  Pt discussed with Dr. Lynnae January

## 2015-04-23 NOTE — Assessment & Plan Note (Signed)
BP Readings from Last 3 Encounters:  04/23/15 153/69  02/10/15 134/73  09/23/14 117/66    Lab Results  Component Value Date   NA 136* 08/29/2014   K 5.2 08/29/2014   CREATININE 0.55 08/29/2014    Assessment: Blood pressure control:   Progress toward BP goal:    Comments: pt didn't have HCTZ and had been very well controlled previously  Plan: Medications:  Continue lisinopril 10mg  qd and HCTZ 12.5mg  qd Educational resources provided: brochure (denies) Self management tools provided:   Other plans: bmet was taken today and pt to f/u in 1-2 wks for DM recheck re-eval BP at that time.

## 2015-04-23 NOTE — Patient Instructions (Signed)
General Instructions:   Please try to bring all your medicines next time. This will help [name] keep you safe from mistakes.   For your diabetes:  1. Stop all lantus and NPH 2. novolog 70/30 take 55 units in the morning and evening 3. Take metformin 1000mg  twice a day 4. Follow up in 2-4 wks to recheck how your doing     Progress Toward Treatment Goals:  Treatment Goal 02/10/2015  Hemoglobin A1C unchanged  Blood pressure at goal    Self Care Goals & Plans:  Self Care Goal 04/23/2015  Manage my medications take my medicines as prescribed; bring my medications to every visit; refill my medications on time  Monitor my health keep track of my blood glucose; bring my glucose meter and log to each visit  Eat healthy foods drink diet soda or water instead of juice or soda; eat more vegetables; eat foods that are low in salt; eat baked foods instead of fried foods; eat fruit for snacks and desserts  Be physically active -  Meeting treatment goals -    Home Blood Glucose Monitoring 02/10/2015  Check my blood sugar 3 times a day  When to check my blood sugar before meals     Care Management & Community Referrals:  Referral 05/13/2013  Referrals made for care management support diabetes educator

## 2015-04-23 NOTE — Assessment & Plan Note (Signed)
Lab Results  Component Value Date   HGBA1C 9.0 02/10/2015   HGBA1C 8.9 08/08/2014   HGBA1C 8.6 01/06/2014    Wt Readings from Last 3 Encounters:  04/23/15 309 lb 3.2 oz (140.252 kg)  02/10/15 302 lb 4.8 oz (137.122 kg)  09/23/14 303 lb 14.4 oz (137.848 kg)    Assessment: Diabetes control:   some of the patient's poor control may be as a result of steadily increasing weight gain this was discussed with the patient and she has a sincere attachment to when she was taking NovoLog 70/30 back in 2013 and had hemoglobin A1c of 7 Progress toward A1C goal:    Comments: pt has not had a good meter but has had some symptomatically hyperglycemic episodes at least one to 3 times per month while on her self made regimen of Lantus 70 units daily at bedtime and regular insulin 15 units with meals  Plan: Medications:  Patient has been taking about a total of 130 units of insulin per day and would like to switch to 70/30. There is concern that this total amount of insulin causes the patient to have some hypoglycemia therefore will only try NovoLog 70/30 55 units 3 times a day Home glucose monitoring: Frequency:   at least 3 times per day and with each if any symptomatic hypoglycemia Timing:   Instruction/counseling given: discussed the need for weight loss and discussed diet Educational resources provided: brochure (denies) Self management tools provided:   Other plans: We'll follow-up in 1-2 weeks, the patient was also given a new meter to pick up at the health department to use and bring to next appointment

## 2015-04-24 NOTE — Progress Notes (Signed)
Internal Medicine Clinic Attending  Case discussed with Dr. Sadek soon after the resident saw the patient.  We reviewed the resident's history and exam and pertinent patient test results.  I agree with the assessment, diagnosis, and plan of care documented in the resident's note. 

## 2015-05-08 ENCOUNTER — Other Ambulatory Visit: Payer: Self-pay | Admitting: *Deleted

## 2015-05-08 MED ORDER — OMEPRAZOLE 20 MG PO CPDR: DELAYED_RELEASE_CAPSULE | ORAL | Status: DC

## 2015-05-08 NOTE — Telephone Encounter (Signed)
As this patient was seen by Dr. Gordy Levan on 09/23/14 and deemed necessary for their treatment as documented in the EHR, I will refill this prescription for Prilosec

## 2015-09-16 ENCOUNTER — Emergency Department (HOSPITAL_COMMUNITY)
Admission: EM | Admit: 2015-09-16 | Discharge: 2015-09-16 | Disposition: A | Discharge status: Home / Self Care | Payer: Self-pay | Attending: Emergency Medicine | Admitting: Emergency Medicine

## 2015-09-16 ENCOUNTER — Encounter (HOSPITAL_COMMUNITY): Payer: Self-pay | Admitting: Emergency Medicine

## 2015-09-16 DIAGNOSIS — Z8701 Personal history of pneumonia (recurrent): Secondary | ICD-10-CM | POA: Insufficient documentation

## 2015-09-16 DIAGNOSIS — J45909 Unspecified asthma, uncomplicated: Secondary | ICD-10-CM | POA: Insufficient documentation

## 2015-09-16 DIAGNOSIS — R61 Generalized hyperhidrosis: Secondary | ICD-10-CM | POA: Insufficient documentation

## 2015-09-16 DIAGNOSIS — R109 Unspecified abdominal pain: Secondary | ICD-10-CM | POA: Insufficient documentation

## 2015-09-16 DIAGNOSIS — E119 Type 2 diabetes mellitus without complications: Secondary | ICD-10-CM | POA: Insufficient documentation

## 2015-09-16 DIAGNOSIS — Z862 Personal history of diseases of the blood and blood-forming organs and certain disorders involving the immune mechanism: Secondary | ICD-10-CM | POA: Insufficient documentation

## 2015-09-16 DIAGNOSIS — R110 Nausea: Secondary | ICD-10-CM | POA: Insufficient documentation

## 2015-09-16 DIAGNOSIS — Z794 Long term (current) use of insulin: Secondary | ICD-10-CM | POA: Insufficient documentation

## 2015-09-16 DIAGNOSIS — I10 Essential (primary) hypertension: Secondary | ICD-10-CM | POA: Insufficient documentation

## 2015-09-16 DIAGNOSIS — R197 Diarrhea, unspecified: Secondary | ICD-10-CM | POA: Insufficient documentation

## 2015-09-16 DIAGNOSIS — Z8739 Personal history of other diseases of the musculoskeletal system and connective tissue: Secondary | ICD-10-CM | POA: Insufficient documentation

## 2015-09-16 DIAGNOSIS — R42 Dizziness and giddiness: Secondary | ICD-10-CM | POA: Insufficient documentation

## 2015-09-16 DIAGNOSIS — Z79899 Other long term (current) drug therapy: Secondary | ICD-10-CM | POA: Insufficient documentation

## 2015-09-16 DIAGNOSIS — K219 Gastro-esophageal reflux disease without esophagitis: Secondary | ICD-10-CM | POA: Insufficient documentation

## 2015-09-16 LAB — I-STAT VENOUS BLOOD GAS, ED
Bicarbonate: 27.1 mEq/L — ABNORMAL HIGH (ref 20.0–24.0)
O2 Saturation: 34 %
PCO2 VEN: 50.9 mmHg — AB (ref 45.0–50.0)
PH VEN: 7.335 — AB (ref 7.250–7.300)
TCO2: 29 mmol/L (ref 0–100)
pO2, Ven: 22 mmHg — CL (ref 30.0–45.0)

## 2015-09-16 LAB — I-STAT TROPONIN, ED: Troponin i, poc: 0.01 ng/mL (ref 0.00–0.08)

## 2015-09-16 LAB — CBC WITH DIFFERENTIAL/PLATELET
BASOS PCT: 0 %
Basophils Absolute: 0 10*3/uL (ref 0.0–0.1)
EOS PCT: 1 %
Eosinophils Absolute: 0.1 10*3/uL (ref 0.0–0.7)
HEMATOCRIT: 38.6 % (ref 36.0–46.0)
Hemoglobin: 12.9 g/dL (ref 12.0–15.0)
Lymphocytes Relative: 44 %
Lymphs Abs: 2.9 10*3/uL (ref 0.7–4.0)
MCH: 31.4 pg (ref 26.0–34.0)
MCHC: 33.4 g/dL (ref 30.0–36.0)
MCV: 93.9 fL (ref 78.0–100.0)
MONO ABS: 0.3 10*3/uL (ref 0.1–1.0)
MONOS PCT: 5 %
Neutro Abs: 3.3 10*3/uL (ref 1.7–7.7)
Neutrophils Relative %: 50 %
PLATELETS: 218 10*3/uL (ref 150–400)
RBC: 4.11 MIL/uL (ref 3.87–5.11)
RDW: 12.6 % (ref 11.5–15.5)
WBC: 6.6 10*3/uL (ref 4.0–10.5)

## 2015-09-16 LAB — I-STAT CHEM 8, ED
BUN: 13 mg/dL (ref 6–20)
CREATININE: 0.7 mg/dL (ref 0.44–1.00)
Calcium, Ion: 1.2 mmol/L (ref 1.12–1.23)
Chloride: 98 mmol/L — ABNORMAL LOW (ref 101–111)
Glucose, Bld: 309 mg/dL — ABNORMAL HIGH (ref 65–99)
HEMATOCRIT: 42 % (ref 36.0–46.0)
HEMOGLOBIN: 14.3 g/dL (ref 12.0–15.0)
Potassium: 3.9 mmol/L (ref 3.5–5.1)
Sodium: 137 mmol/L (ref 135–145)
TCO2: 24 mmol/L (ref 0–100)

## 2015-09-16 LAB — CBG MONITORING, ED: Glucose-Capillary: 247 mg/dL — ABNORMAL HIGH (ref 65–99)

## 2015-09-16 MED ORDER — MECLIZINE HCL 25 MG PO TABS: 25.0000 mg | ORAL_TABLET | Freq: Three times a day (TID) | ORAL | Status: DC | PRN

## 2015-09-16 MED ORDER — MECLIZINE HCL 25 MG PO TABS
25.0000 mg | ORAL_TABLET | Freq: Once | ORAL | Status: AC
  Administered 2015-09-16: 25 mg via ORAL
  Filled 2015-09-16: qty 1

## 2015-09-16 MED ORDER — SODIUM CHLORIDE 0.9 % IV BOLUS (SEPSIS)
1000.0000 mL | Freq: Once | INTRAVENOUS | Status: AC
  Administered 2015-09-16: 1000 mL via INTRAVENOUS

## 2015-09-16 NOTE — ED Notes (Signed)
RT called for blood gas

## 2015-09-16 NOTE — ED Notes (Signed)
Got up this morning to use restroom and work cramp out of leg (has neuropathy) and stated she felt "wave of hotness" and had diarr; got diaphoretic and lightheaded with mild nausea. CBG 257 per EMS. No chest pain or SOB. No LOC. Diabetic.

## 2015-09-16 NOTE — Discharge Instructions (Signed)

## 2015-09-16 NOTE — ED Notes (Signed)
RT at bedside for blood gas. Pt not able to ambulate at this time. MD aware.

## 2015-09-16 NOTE — ED Notes (Signed)
PT reports she experiencing worse dizziness when head of the bed raised. States room spinning. States better when eyes are closed. MD informed.

## 2015-09-16 NOTE — ED Notes (Signed)
Ambulated pt to restroom with assistance. Pt stated she was a little dizzy while walking but steady gait. MD made aware.

## 2015-09-16 NOTE — Discharge Planning (Signed)
Patient is a orange card holder and established with care at [facility] Internal medicine. Patients orange card recently expired, pt states she has not been to the practice because of this issue. Orange card application provided. ED follow up and orange card renewal appointment made for [facility] Nov 2,2016 @ 9:15am, pt verbalized understanding of this appointment. My contact information provided for any future questions or concerns. No other [place] Specialist needs identified at this time.  [facility] [phone]

## 2015-09-16 NOTE — ED Provider Notes (Signed)
CSN: [id number]     Arrival date & time 09/16/15  2440 History   First MD Initiated Contact with Patient 09/16/15 0840     Chief Complaint  Patient presents with  . Weakness     (Consider location/radiation/quality/duration/timing/severity/associated sxs/prior Treatment) Patient is a 54 y.o. female presenting with weakness. The history is provided by the patient.  Weakness This is a new problem. The current episode started today. The problem occurs intermittently. The problem has been gradually improving. Associated symptoms include abdominal pain, diaphoresis, nausea and weakness. Pertinent negatives include no chest pain, chills, coughing, fever or vomiting. The symptoms are aggravated by standing. The treatment provided mild relief.   [name] is a 54 yo F PMH HTN, HLD, DM2 that is presenting with weakness and near syncope. She woke up this morning and sitting on the edge of her bed when she starting having a flushing and became sweaty. She felt weak and lightheaded. She called to her mother for help as she was unable to get herself to the restroom. Had an episode of BM incontinence during this. Sat on the commode and unable to get herself to her feet. She felt clammy after the initial episode. They applied cold compress. Denies any prior episodes of this occuring. She had normal blood sugar when checked by EMS and hasn't taken her medications today. She denies any new medications or changes.  She felt improvement with IV fluids that were given by EMS. Denies any travel, EtOH or smoking tobacco.    Past Medical History  Diagnosis Date  . Diabetes mellitus   . Hypertension   . Asthma 11/01/2010  . Pneumonia     as a child  . GERD (gastroesophageal reflux disease)   . Anemia   . Osteomyelitis [facility])    Past Surgical History  Procedure Laterality Date  . Amputation Left 02/19/2013    Procedure: Amputation of Left Great Toe;  Surgeon: Debra Rossetti, MD;  Location: [place];   Service: Orthopedics;  Laterality: Left;  . Cesarean section      x 4  . Tubal ligation    . Amputation Left 08/29/2014    Procedure: Left Foot 2nd and 1st Toe Amputation;  Surgeon: Debra Minion, MD;  Location: [place];  Service: Orthopedics;  Laterality: Left;  . Achilles tendon lengthening Left 08/29/2014    Procedure: Left Achilles Lengthening;  Surgeon: Debra Minion, MD;  Location: [place];  Service: Orthopedics;  Laterality: Left;   Family History  Problem Relation Age of Onset  . Diabetes Father   . Hypertension Father   . Diabetes Daughter   . Hypertension Mother   . Dementia Mother    Social History  Substance Use Topics  . Smoking status: Never Smoker   . Smokeless tobacco: Never Used  . Alcohol Use: No   OB History    No data available     Review of Systems  Constitutional: Positive for diaphoresis. Negative for fever and chills.  Respiratory: Negative for cough and shortness of breath.   Cardiovascular: Negative for chest pain.  Gastrointestinal: Positive for nausea, abdominal pain and diarrhea. Negative for vomiting and constipation.  Genitourinary: Negative for dysuria.  Skin: Negative for pallor.  Neurological: Positive for weakness and light-headedness. Negative for syncope.  Psychiatric/Behavioral: Negative for confusion.      Allergies  Aspirin; Ibuprofen; and Penicillins  Home Medications   Prior to Admission medications   Medication Sig Start Date End Date Taking? Authorizing Provider  acetaminophen (TYLENOL) 500 MG tablet Take 1,000 mg by mouth every 6 (six) hours as needed for moderate pain.   Yes Historical Provider, MD  hydrochlorothiazide (MICROZIDE) 12.5 MG capsule Take 1 capsule (12.5 mg total) by mouth daily. 04/23/15  Yes Debra Noble, MD  insulin aspart (NOVOLOG) 100 UNIT/ML injection Inject 15 Units into the skin 3 (three) times daily before meals.   Yes Historical Provider, MD  lisinopril (PRINIVIL,ZESTRIL) 10 MG tablet Take 1 tablet (10 mg  total) by mouth at bedtime. 04/23/15  Yes Debra Noble, MD  metFORMIN (GLUCOPHAGE) 1000 MG tablet Take 1 tablet (1,000 mg total) by mouth 2 (two) times daily with a meal. 04/23/15 04/22/16 Yes Debra Noble, MD  Multiple Vitamin (MULTIVITAMIN WITH MINERALS) TABS tablet Take 1 tablet by mouth daily.   Yes Historical Provider, MD  omeprazole (PRILOSEC) 20 MG capsule Take 2 tablets 30 minutes prior to one meal daily. 05/08/15  Yes Debra Sherrye Payor, MD  POTASSIUM PO Take 2 tablets by mouth daily.   Yes Historical Provider, MD  insulin NPH-regular Human (NOVOLIN 70/30) (70-30) 100 UNIT/ML injection Inject 55 Units into the skin 2 (two) times daily with a meal. 04/23/15   Debra Noble, MD  meclizine (ANTIVERT) 25 MG tablet Take 1 tablet (25 mg total) by mouth 3 (three) times daily as needed for dizziness. 09/16/15   Debra Ax, MD   BP 139/58 mmHg  Pulse 93  Resp 18  SpO2 99%  LMP 06/23/2014 Physical Exam  Constitutional: She is oriented to person, place, and time. She appears well-developed and well-nourished.  HENT:  Head: Normocephalic and atraumatic.  Eyes: Conjunctivae and EOM are normal. Pupils are equal, round, and reactive to light.  Neck: Normal range of motion. Neck supple.  Cardiovascular: Normal rate, regular rhythm, normal heart sounds and intact distal pulses.   No murmur heard. Pulmonary/Chest: Effort normal and breath sounds normal. No respiratory distress. She has no wheezes.  Abdominal: Soft. Bowel sounds are normal. She exhibits no distension. There is no tenderness.  Musculoskeletal: Normal range of motion. She exhibits no edema.  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 No cranial nerve deficit.  Skin: Skin is warm. No rash noted.  Psychiatric: She has a normal mood and affect.    ED Course  Procedures (including critical care time) Labs Review Labs Reviewed  I-STAT CHEM 8, ED - Abnormal; Notable for the  following:    Chloride 98 (*)    Glucose, Bld 309 (*)    All other components within normal limits  CBG MONITORING, ED - Abnormal; Notable for the following:    Glucose-Capillary 247 (*)    All other components within normal limits  I-STAT VENOUS BLOOD GAS, ED - Abnormal; Notable for the following:    pH, Ven 7.335 (*)    pCO2, Ven 50.9 (*)    pO2, Ven 22.0 (*)    Bicarbonate 27.1 (*)    All other components within normal limits  CBC WITH DIFFERENTIAL/PLATELET  BLOOD GAS, VENOUS  I-STAT TROPOININ, ED    Imaging Review No results found. I have personally reviewed and evaluated these images and lab results as part of my medical decision-making.   EKG Interpretation   Date/Time:  Wednesday September 16 2015 08:46:03 EDT Ventricular Rate:  77 PR Interval:  144 QRS Duration: 82 QT Interval:  383 QTC Calculation: 433 R Axis:   72 Text  Interpretation:  Sinus rhythm Consider left atrial enlargement  Baseline wander in lead(s) III Confirmed by [facility] MD, JASON [phone]) on  09/16/2015 9:34:18 AM      Medications  sodium chloride 0.9 % bolus 1,000 mL (0 mLs Intravenous Stopped 09/16/15 1041)  meclizine (ANTIVERT) tablet 25 mg (25 mg Oral Given 09/16/15 1102)    MDM   Final diagnoses:  Dizziness   [name] is a 54 yo F that is presenting with dizziness. Symptoms are possible for vasovagal near syncope vs vertigo.  Unable to tolerate orthostatic vital signs. She was given fluids and meclizine. reassessed after meclizine and was able to ambulate and felt improved. She has follow up scheduled with her PCP. She is stable for discharge.   Debra Ax, MD PGY-3, [facility] 09/16/2015, 1:51 PM       Debra Ax, MD 09/16/15 [place], MD 09/17/15 [phone]

## 2015-09-30 ENCOUNTER — Ambulatory Visit (INDEPENDENT_AMBULATORY_CARE_PROVIDER_SITE_OTHER): Payer: Self-pay | Admitting: Internal Medicine

## 2015-09-30 ENCOUNTER — Ambulatory Visit: Payer: Self-pay

## 2015-09-30 ENCOUNTER — Encounter: Payer: Self-pay | Admitting: Internal Medicine

## 2015-09-30 VITALS — BP 135/71 | HR 84 | Temp 97.8°F | Ht 73.0 in | Wt 298.9 lb

## 2015-09-30 DIAGNOSIS — K219 Gastro-esophageal reflux disease without esophagitis: Secondary | ICD-10-CM

## 2015-09-30 DIAGNOSIS — R42 Dizziness and giddiness: Secondary | ICD-10-CM

## 2015-09-30 DIAGNOSIS — I10 Essential (primary) hypertension: Secondary | ICD-10-CM

## 2015-09-30 DIAGNOSIS — IMO0002 Reserved for concepts with insufficient information to code with codable children: Secondary | ICD-10-CM

## 2015-09-30 DIAGNOSIS — Z794 Long term (current) use of insulin: Secondary | ICD-10-CM

## 2015-09-30 DIAGNOSIS — Z23 Encounter for immunization: Secondary | ICD-10-CM

## 2015-09-30 DIAGNOSIS — R55 Syncope and collapse: Secondary | ICD-10-CM

## 2015-09-30 DIAGNOSIS — E118 Type 2 diabetes mellitus with unspecified complications: Secondary | ICD-10-CM

## 2015-09-30 DIAGNOSIS — E1165 Type 2 diabetes mellitus with hyperglycemia: Secondary | ICD-10-CM

## 2015-09-30 LAB — GLUCOSE, CAPILLARY: Glucose-Capillary: 302 mg/dL — ABNORMAL HIGH (ref 65–99)

## 2015-09-30 LAB — POCT GLYCOSYLATED HEMOGLOBIN (HGB A1C): HEMOGLOBIN A1C: 10

## 2015-09-30 MED ORDER — LANCETS MISC: 1.0000 | Freq: Three times a day (TID) | Status: DC

## 2015-09-30 MED ORDER — INSULIN NPH ISOPHANE & REGULAR (70-30) 100 UNIT/ML ~~LOC~~ SUSP: 55.0000 [IU] | Freq: Two times a day (BID) | SUBCUTANEOUS | Status: DC

## 2015-09-30 MED ORDER — METFORMIN HCL 1000 MG PO TABS: 1000.0000 mg | ORAL_TABLET | Freq: Two times a day (BID) | ORAL | Status: DC

## 2015-09-30 MED ORDER — LISINOPRIL 10 MG PO TABS: 10.0000 mg | ORAL_TABLET | Freq: Every day | ORAL | Status: DC

## 2015-09-30 MED ORDER — HYDROCHLOROTHIAZIDE 12.5 MG PO CAPS: 12.5000 mg | ORAL_CAPSULE | Freq: Every day | ORAL | Status: DC

## 2015-09-30 MED ORDER — OMEPRAZOLE 20 MG PO CPDR: 40.0000 mg | DELAYED_RELEASE_CAPSULE | Freq: Every day | ORAL | Status: DC

## 2015-09-30 MED ORDER — GLUCOSE BLOOD VI STRP: 1.0000 | ORAL_STRIP | Freq: Three times a day (TID) | Status: DC

## 2015-09-30 NOTE — Assessment & Plan Note (Signed)
After discharge, patient felt "bad" over the next several days. She states if she were to turn her head quickly or chase after her grandchildren, she would become dizzy as the room would spin around her. She would sit down and rest for  Few minutes and symptoms would resolve on their own. She did not take any meclizine. Patient has a history of vertigo with possible BPPV diagnosed 2 years ago which resolved on its own. At the time, dix-hallpike was negative, but symptoms were concerning for BPPV. MRI/MRA was normal. Plans for referral to ENT were made, but patient never followed up. These recent symptoms seems similar to her prior episodes, but she has not experienced them over the last few days. Her symptoms seem most consistent with vertigo caused by BPPV. Dix-hallpike was not performed today as patient's symptoms have improved.   Plan: -Continue to monitor -Meclizine as needed -If symptoms reoccur consider referral to vestibular rehab

## 2015-09-30 NOTE — Assessment & Plan Note (Addendum)
Lab Results  Component Value Date   HGBA1C 10.0 09/30/2015   HGBA1C 9.0 02/10/2015   HGBA1C 8.9 08/08/2014     Assessment: Diabetes control:  Uncontrolled Progress toward A1C goal:   Deteriorated Comments: Currently on Lantus 70 QHS, Novolog 15 units TID WC. Prefers to be on 70/30, but couldn't get it yet due to losing her orange card. She plans to meet with Marlana Latus today to change this.   Plan: Medications:  continue current medications, but switch to 70/30 55 units BID once orange card obtained. Home glucose monitoring: Frequency:  QID Timing:  AC/HS Instruction/counseling given: discussed the need for weight loss and discussed diet Educational resources provided: brochure, handout Self management tools provided:   Other plans: Refilled 70/30, strips and lancets. Place referral to Dr. Katy Fitch.

## 2015-09-30 NOTE — Patient Instructions (Signed)
TAKE 70/30 55 UNITS BID. RETURN IN 3 MONTHS FOR FOLLOW UP.  MAKE AN APPOINTMENT TO FOLLOW UP SOONER IF VERTIGO RETURNS.

## 2015-09-30 NOTE — Assessment & Plan Note (Signed)
Patient was seen on 10/19 for complaint of lightheadedness. She states she woke up that morning with a cramp in her leg, jumped up from bed and then started feeling very lightheaded and unable to tolerate standing. She had to hold onto a rack to keep from falling. She felt flushed, diaphoretic, weak, lightheaded and had an urge to have a bowel movement. Her mother put a cold compress on her and called EMS. She was given fluids and meclizine. Vitals were BP 139/58, HR 93, RR 18, 100% on room air. Patient was unable to tolerate orthostatic testing. Labs were within normal limits, EKG showed sinus rhythm with HR of 77, troponin negative. DDx was vasovagal versus vertigo. Patient was discharged home from the ED. She has not had any recurrent symptoms similar to this. She had not had any change in food/drink/activity the days prior. Symptoms and presentation seem most consistent with vasovagal pre-syncope.  Plan: -Monitor as this was likely a one time occurence

## 2015-09-30 NOTE — Progress Notes (Signed)
Subjective:    [name], female    DOB: 15-Jan-1961, 54 y.o.   MRN: [id number]  HPI [name] is a 54 y.o. female with PMHx of T2DM and HTN who presents to the clinic for ED follow up after being seen for dizziness. Please see A&P for the status of the patient's chronic medical problems.   Patient was seen on 10/19 for complaint of lightheadedness. She states she woke up that morning with a cramp in her leg, jumped up from bed and then started feeling very lightheaded and unable to tolerate standing. She had to hold onto a rack to keep from falling. She felt flushed, diaphoretic, weak, lightheaded and had an urge to have a bowel movement. Her mother put a cold compress on her and called EMS. She was given fluids and meclizine. Vitals were BP 139/58, HR 93, RR 18, 100% on room air. Patient was unable to tolerate orthostatic testing. Labs were within normal limits, EKG showed sinus rhythm with HR of 77, troponin negative. DDx was vasovagal versus vertigo. Patient was discharged home from the ED. After discharge, patient felt "bad" over the next several days. She states if she were to turn her head quickly or chase after her grandchildren, she would become dizzy as the room would spin around her. She would sit down and rest for  Few minutes and symptoms would resolve on their own. She did not take any meclizine. Patient has a history of vertigo with possible BPPV diagnosed 2 years ago which resolved on its own. At the time, dix-hallpike was negative, but symptoms were concerning for BPPV. MRI/MRA was normal. Plans for referral to ENT were made, but patient never followed up. These recent symptoms seems similar to her prior episodes, but she has not experienced them over the last few days. Currently she feels well.   Past Medical History  Diagnosis Date  . Diabetes mellitus   . Hypertension   . Asthma 11/01/2010  . Pneumonia     as a child  . GERD (gastroesophageal reflux disease)    . Anemia   . Osteomyelitis [facility])     Outpatient Encounter Prescriptions as of 09/30/2015  Medication Sig Note  . acetaminophen (TYLENOL) 500 MG tablet Take 1,000 mg by mouth every 6 (six) hours as needed for moderate pain.   Marland Kitchen glucose blood test strip 1 each by Other route 4 (four) times daily -  before meals and at bedtime. Use as instructed   . hydrochlorothiazide (MICROZIDE) 12.5 MG capsule Take 1 capsule (12.5 mg total) by mouth daily.   . insulin aspart (NOVOLOG) 100 UNIT/ML injection Inject 15 Units into the skin 3 (three) times daily before meals.   . insulin NPH-regular Human (NOVOLIN 70/30) (70-30) 100 UNIT/ML injection Inject 55 Units into the skin 2 (two) times daily with a meal.   . Lancets MISC 1 each by Does not apply route 4 (four) times daily -  before meals and at bedtime.   Marland Kitchen lisinopril (PRINIVIL,ZESTRIL) 10 MG tablet Take 1 tablet (10 mg total) by mouth at bedtime.   . meclizine (ANTIVERT) 25 MG tablet Take 1 tablet (25 mg total) by mouth 3 (three) times daily as needed for dizziness.   . metFORMIN (GLUCOPHAGE) 1000 MG tablet Take 1 tablet (1,000 mg total) by mouth 2 (two) times daily with a meal.   . Multiple Vitamin (MULTIVITAMIN WITH MINERALS) TABS tablet Take 1 tablet by mouth daily.   Marland Kitchen omeprazole (PRILOSEC) 20 MG  capsule Take 2 capsules (40 mg total) by mouth daily. Take 2 tablets 30 minutes prior to one meal daily.   Marland Kitchen POTASSIUM PO Take 2 tablets by mouth daily. 09/16/2015: OTC  . [DISCONTINUED] hydrochlorothiazide (MICROZIDE) 12.5 MG capsule Take 1 capsule (12.5 mg total) by mouth daily.   . [DISCONTINUED] insulin NPH-regular Human (NOVOLIN 70/30) (70-30) 100 UNIT/ML injection Inject 55 Units into the skin 2 (two) times daily with a meal.   . [DISCONTINUED] lisinopril (PRINIVIL,ZESTRIL) 10 MG tablet Take 1 tablet (10 mg total) by mouth at bedtime.   . [DISCONTINUED] metFORMIN (GLUCOPHAGE) 1000 MG tablet Take 1 tablet (1,000 mg total) by mouth 2 (two) times daily with  a meal.   . [DISCONTINUED] omeprazole (PRILOSEC) 20 MG capsule Take 2 tablets 30 minutes prior to one meal daily.    No facility-administered encounter medications on file as of 09/30/2015.    Family History  Problem Relation Age of Onset  . Diabetes Father   . Hypertension Father   . Diabetes Daughter   . Hypertension Mother   . Dementia Mother     Social History   Social History  . Marital Status: Legally Separated    Spouse Name: N/A  . Number of Children: N/A  . Years of Education: N/A   Occupational History  . Not on file.   Social History Main Topics  . Smoking status: Never Smoker   . Smokeless tobacco: Never Used  . Alcohol Use: No  . Drug Use: No  . Sexual Activity: Not on file   Other Topics Concern  . Not on file   Social History Narrative    Review of Systems General: Denies fever, chills, fatigue, and diaphoresis.  Respiratory: Denies SOB, cough, DOE, chest tightness Cardiovascular: Denies chest pain and palpitations.  Gastrointestinal: Denies nausea, vomiting, abdominal pain, diarrhea, constipation Musculoskeletal: Denies myalgias Neurological: Admits to vertigo/dizziness (resolved). Denies headaches, weakness, numbness, and syncope Psychiatric/Behavioral: Denies sleep disturbance and anxiety.    Objective:   Physical Exam Filed Vitals:   09/30/15 0933  BP: 135/71  Pulse: 84  Temp: 97.8 F (36.6 C)  TempSrc: Oral  Height: 6\' 1"  (1.854 m)  Weight: 298 lb 14.4 oz (135.58 kg)  SpO2: 100%   General: Vital signs reviewed.  Patient is obese, in no acute distress and cooperative with exam.  Cardiovascular: RRR, S1 normal, S2 normal, no murmurs, gallops, or rubs. Pulmonary/Chest: Clear to auscultation bilaterally, no wheezes, rales, or rhonchi. Abdominal: Soft, non-tender, non-distended, BS + Extremities: Trace non-pitting lower extremity edema bilaterally Neurological: A&O x3, Strength is normal and symmetric bilaterally, cranial nerve II-XII  are grossly intact, no focal motor deficit, sensory intact to light touch bilaterally. D Psychiatric: Normal mood and affect. speech and behavior is normal. Cognition and memory are normal.      Assessment & Plan:   Please see problem based assessment and plan.

## 2015-10-01 ENCOUNTER — Ambulatory Visit: Payer: Self-pay

## 2015-10-01 NOTE — Progress Notes (Signed)
Internal Medicine Clinic Attending  Case discussed with Dr. Richardson soon after the resident saw the patient.  We reviewed the resident's history and exam and pertinent patient test results.  I agree with the assessment, diagnosis, and plan of care documented in the resident's note. 

## 2015-10-07 ENCOUNTER — Ambulatory Visit: Payer: Self-pay

## 2015-10-09 ENCOUNTER — Encounter: Payer: Self-pay | Admitting: Internal Medicine

## 2015-10-09 ENCOUNTER — Other Ambulatory Visit: Payer: Self-pay | Admitting: Internal Medicine

## 2015-10-09 DIAGNOSIS — E118 Type 2 diabetes mellitus with unspecified complications: Principal | ICD-10-CM

## 2015-10-09 DIAGNOSIS — E1165 Type 2 diabetes mellitus with hyperglycemia: Secondary | ICD-10-CM

## 2015-10-20 ENCOUNTER — Encounter: Payer: Self-pay | Admitting: Student

## 2015-10-30 ENCOUNTER — Encounter: Payer: Self-pay | Admitting: Internal Medicine

## 2015-10-30 ENCOUNTER — Other Ambulatory Visit: Payer: Self-pay | Admitting: Internal Medicine

## 2015-11-25 ENCOUNTER — Encounter (HOSPITAL_COMMUNITY): Payer: Self-pay | Admitting: Emergency Medicine

## 2015-11-25 ENCOUNTER — Emergency Department (HOSPITAL_COMMUNITY): Payer: Self-pay

## 2015-11-25 ENCOUNTER — Emergency Department (HOSPITAL_COMMUNITY)
Admission: EM | Admit: 2015-11-25 | Discharge: 2015-11-26 | Disposition: A | Discharge status: Home / Self Care | Payer: Self-pay | Attending: Emergency Medicine | Admitting: Emergency Medicine

## 2015-11-25 DIAGNOSIS — I10 Essential (primary) hypertension: Secondary | ICD-10-CM | POA: Insufficient documentation

## 2015-11-25 DIAGNOSIS — E119 Type 2 diabetes mellitus without complications: Secondary | ICD-10-CM | POA: Insufficient documentation

## 2015-11-25 DIAGNOSIS — Z862 Personal history of diseases of the blood and blood-forming organs and certain disorders involving the immune mechanism: Secondary | ICD-10-CM | POA: Insufficient documentation

## 2015-11-25 DIAGNOSIS — Z794 Long term (current) use of insulin: Secondary | ICD-10-CM | POA: Insufficient documentation

## 2015-11-25 DIAGNOSIS — Z880 Allergy status to penicillin: Secondary | ICD-10-CM | POA: Insufficient documentation

## 2015-11-25 DIAGNOSIS — Z8701 Personal history of pneumonia (recurrent): Secondary | ICD-10-CM | POA: Insufficient documentation

## 2015-11-25 DIAGNOSIS — Z7984 Long term (current) use of oral hypoglycemic drugs: Secondary | ICD-10-CM | POA: Insufficient documentation

## 2015-11-25 DIAGNOSIS — Z8739 Personal history of other diseases of the musculoskeletal system and connective tissue: Secondary | ICD-10-CM | POA: Insufficient documentation

## 2015-11-25 DIAGNOSIS — Z79899 Other long term (current) drug therapy: Secondary | ICD-10-CM | POA: Insufficient documentation

## 2015-11-25 DIAGNOSIS — R079 Chest pain, unspecified: Secondary | ICD-10-CM | POA: Insufficient documentation

## 2015-11-25 DIAGNOSIS — K219 Gastro-esophageal reflux disease without esophagitis: Secondary | ICD-10-CM | POA: Insufficient documentation

## 2015-11-25 DIAGNOSIS — J45901 Unspecified asthma with (acute) exacerbation: Secondary | ICD-10-CM | POA: Insufficient documentation

## 2015-11-25 LAB — I-STAT TROPONIN, ED: Troponin i, poc: 0.01 ng/mL (ref 0.00–0.08)

## 2015-11-25 LAB — BASIC METABOLIC PANEL
Anion gap: 9 (ref 5–15)
BUN: 14 mg/dL (ref 6–20)
CO2: 30 mmol/L (ref 22–32)
Calcium: 9.7 mg/dL (ref 8.9–10.3)
Chloride: 104 mmol/L (ref 101–111)
Creatinine, Ser: 0.84 mg/dL (ref 0.44–1.00)
GFR calc Af Amer: >60 mL/min (ref >60)
GLUCOSE: 146 mg/dL — AB (ref 65–99)
POTASSIUM: 4.7 mmol/L (ref 3.5–5.1)
Sodium: 143 mmol/L (ref 135–145)

## 2015-11-25 LAB — CBC
HEMATOCRIT: 39 % (ref 36.0–46.0)
Hemoglobin: 12.3 g/dL (ref 12.0–15.0)
MCH: 30.5 pg (ref 26.0–34.0)
MCHC: 31.5 g/dL (ref 30.0–36.0)
MCV: 96.8 fL (ref 78.0–100.0)
Platelets: 297 10*3/uL (ref 150–400)
RBC: 4.03 MIL/uL (ref 3.87–5.11)
RDW: 12.9 % (ref 11.5–15.5)
WBC: 7.1 10*3/uL (ref 4.0–10.5)

## 2015-11-25 MED ORDER — GI COCKTAIL ~~LOC~~
30.0000 mL | Freq: Once | ORAL | Status: AC
  Administered 2015-11-26: 30 mL via ORAL
  Filled 2015-11-25: qty 30

## 2015-11-25 NOTE — ED Provider Notes (Signed)
CSN: [id number]     Arrival date & time 11/25/15  1825 History  By signing my name below, I, [name], attest that this documentation has been prepared under the direction and in the presence of No att. providers found. Electronically Signed: Erling Rivera, ED Scribe. 11/27/2015. 2:30 PM.    Chief Complaint  Patient presents with  . Chest Pain   The history is provided by the patient. No language interpreter was used.    HPI Comments: [name] is a 54 y.o. female with a h/o DM, HTN, asthma, and GERD who presents to the Emergency Department complaining of intermittent, mild, 3/10, left side chest pressure with radiates to left shoulder and left elbow that began this morning. She reports earlier today she was having associated mild chest pain and SOB with exertion; she states this has now resolved. Pt states she has felt similar symptoms in the past when she was having acid reflux but she notes usually she is able to make it subside with medication. She endorses that she took 100mg  in total of her omeprazole medication with no significant relief. Pt denies any aggravating/alleviating factors. She notes she has had 2 cardiac workups in the past, in 2004 and 2012, and both resulted in diagnosis of acid reflux. She notes she has a h/o cardiac issues in her family through her father and grandmother but she denies any early onset cardiac issues. Pt denies any nausea, vomiting, diarrhea, diaphoresis, or fevers.   Past Medical History  Diagnosis Date  . Diabetes mellitus   . Hypertension   . Asthma 11/01/2010  . Pneumonia     as a child  . GERD (gastroesophageal reflux disease)   . Anemia   . Osteomyelitis [facility])    Past Surgical History  Procedure Laterality Date  . Amputation Left 02/19/2013    Procedure: Amputation of Left Great Toe;  Surgeon: Mcarthur Rossetti, MD;  Location: [place];  Service: Orthopedics;  Laterality: Left;  . Cesarean section      x 4  . Tubal ligation     . Amputation Left 08/29/2014    Procedure: Left Foot 2nd and 1st Toe Amputation;  Surgeon: Newt Minion, MD;  Location: [place];  Service: Orthopedics;  Laterality: Left;  . Achilles tendon lengthening Left 08/29/2014    Procedure: Left Achilles Lengthening;  Surgeon: Newt Minion, MD;  Location: [place];  Service: Orthopedics;  Laterality: Left;   Family History  Problem Relation Age of Onset  . Diabetes Father   . Hypertension Father   . Diabetes Daughter   . Hypertension Mother   . Dementia Mother    Social History  Substance Use Topics  . Smoking status: Never Smoker   . Smokeless tobacco: Never Used  . Alcohol Use: No   OB History    No data available     Review of Systems  Constitutional: Negative for fever and diaphoresis.  HENT: Negative for congestion, postnasal drip and rhinorrhea.   Eyes: Negative for pain.  Respiratory: Positive for shortness of breath. Negative for cough and wheezing.   Cardiovascular: Positive for chest pain.  Gastrointestinal: Negative for nausea, vomiting, abdominal pain and diarrhea.  Genitourinary: Negative for dysuria, urgency and frequency.  Musculoskeletal: Negative for myalgias and back pain.  Skin: Negative for rash.  Neurological: Negative for dizziness, weakness, light-headedness and headaches.  Hematological: Does not bruise/bleed easily.      Allergies  Aspirin; Ibuprofen; and Penicillins  Home Medications   Prior  to Admission medications   Medication Sig Start Date End Date Taking? Authorizing Provider  gabapentin (NEURONTIN) 100 MG capsule Take 100 mg by mouth 3 (three) times daily.   Yes Historical Provider, MD  hydrochlorothiazide (MICROZIDE) 12.5 MG capsule Take 1 capsule (12.5 mg total) by mouth daily. 09/30/15  Yes Alexa Sherral Hammers, MD  insulin NPH-regular Human (NOVOLIN 70/30) (70-30) 100 UNIT/ML injection Inject 55 Units into the skin 2 (two) times daily with a meal. 09/30/15  Yes Alexa Sherral Hammers, MD  lisinopril  (PRINIVIL,ZESTRIL) 10 MG tablet Take 1 tablet (10 mg total) by mouth at bedtime. 09/30/15  Yes Alexa Sherral Hammers, MD  metFORMIN (GLUCOPHAGE) 1000 MG tablet Take 1 tablet (1,000 mg total) by mouth 2 (two) times daily with a meal. 09/30/15 09/29/16 Yes Alexa Sherral Hammers, MD  Multiple Vitamin (MULTIVITAMIN WITH MINERALS) TABS tablet Take 1 tablet by mouth daily.   Yes Historical Provider, MD  omeprazole (PRILOSEC) 20 MG capsule Take 2 capsules (40 mg total) by mouth daily. Take 2 tablets 30 minutes prior to one meal daily. 09/30/15  Yes Alexa Sherral Hammers, MD  glucose blood test strip 1 each by Other route 4 (four) times daily -  before meals and at bedtime. Use as instructed 09/30/15   Alexa Sherral Hammers, MD  Lancets MISC 1 each by Does not apply route 4 (four) times daily -  before meals and at bedtime. 09/30/15   Alexa Sherral Hammers, MD  meclizine (ANTIVERT) 25 MG tablet Take 1 tablet (25 mg total) by mouth 3 (three) times daily as needed for dizziness. Patient not taking: Reported on 11/25/2015 09/16/15   Rosemarie Ax, MD   BP 108/56 mmHg  Pulse 84  Temp(Src) 98 F (36.7 C) (Oral)  Resp 19  Ht 6\' 1"  (1.854 m)  Wt 298 lb (135.172 kg)  BMI 39.32 kg/m2  SpO2 100%  LMP 06/23/2014 Physical Exam  Constitutional: She is oriented to person, place, and time. She appears well-developed and well-nourished. No distress.  HENT:  Head: Normocephalic and atraumatic.  Right Ear: External ear normal.  Left Ear: External ear normal.  Nose: Nose normal.  Mouth/Throat: Oropharynx is clear and moist. No oropharyngeal exudate.  Eyes: EOM are normal. Pupils are equal, round, and reactive to light.  Neck: Normal range of motion. Neck supple.  Cardiovascular: Normal rate, regular rhythm, normal heart sounds and intact distal pulses.   No murmur heard. Pulmonary/Chest: Effort normal. No respiratory distress. She has no wheezes. She has no rales.  Abdominal: Soft. She exhibits no distension. There is no  tenderness.  Musculoskeletal: Normal range of motion. She exhibits no edema or tenderness.  Neurological: She is alert and oriented to person, place, and time.  Skin: Skin is warm and dry. No rash noted. She is not diaphoretic.  Vitals reviewed.   ED Course  Procedures (including critical care time) Labs Review Labs Reviewed  BASIC METABOLIC PANEL - Abnormal; Notable for the following:    Glucose, Bld 146 (*)    All other components within normal limits  CBC  I-STAT TROPOININ, ED  Randolm Idol, ED    Imaging Review Dg Chest 2 View  11/25/2015  CLINICAL DATA:  Left-sided chest pain for 2 days EXAM: CHEST - 2 VIEW COMPARISON:  08/29/2014 FINDINGS: The heart size and mediastinal contours are within normal limits. Both lungs are clear. The visualized skeletal structures are unremarkable. IMPRESSION: No active disease. Electronically Signed   By: Inez Catalina M.D.   On: 11/25/2015  19:22   I have personally reviewed and evaluated these images and lab results as part of my medical decision-making.   EKG Interpretation   Date/Time:  Thursday November 26 2015 02:39:18 EST Ventricular Rate:  87 PR Interval:  150 QRS Duration: 79 QT Interval:  366 QTC Calculation: 440 R Axis:   37 Text Interpretation:  Sinus rhythm Low voltage, precordial leads Abnormal  R-wave progression, early transition No significant change since last  tracing Confirmed by NGUYEN, EMILY ([id number]) on 11/26/2015 3:03:53 AM      MDM  Patient was seen and evaluated in stable condition.  Reports multiple cardiac work ups for same symptoms with diagnosis always being GERD.  Troponin x2 and EKG x2 unremarkable.  Patient given GI cocktail with resolution of symptoms.  Patient refused ASA.  Discussed with patient need for close follow up with cards/PCP especially in light of DM.  She expressed understanding and agreement with plan for discharge and outpatient follow up.  Strict return precautions given.   Final  diagnoses:  Chest pain, unspecified chest pain type    1. Chest pain, likely GERD  I personally performed the services described in this documentation, which was scribed in my presence. The recorded information has been reviewed and is accurate.   Harvel Quale, MD 11/27/15 [id number]

## 2015-11-25 NOTE — ED Notes (Signed)
Pt in waiting room eating a full meal with family. NAD at present.

## 2015-11-25 NOTE — ED Notes (Signed)
Pt from home for eval of left sided chest heaviness this morning with radiation to left shoulder and elbow, pt states pain is consistent. Denies any n/v/d or fevers. Reports some mild sob with cp. Initially thought it was acid reflux but none of her meds helped in home. nad noted.

## 2015-11-26 LAB — I-STAT TROPONIN, ED: TROPONIN I, POC: 0 ng/mL (ref 0.00–0.08)

## 2015-11-26 NOTE — Discharge Instructions (Signed)
You were seen in the emergency department tonight for your chest pain.  This does not appear to be life threatening at this time.  This needs close follow up outpatient.  Make a follow up appointment with your primary care physician and with the cardiology clinic whose information is provided.  Return immediately with sudden worsening chest pain that is severe or associated with sweating, change in color, or shortness of breath.  Nonspecific Chest Pain  Chest pain can be caused by many different conditions. There is always a chance that your pain could be related to something serious, such as a heart attack or a blood clot in your lungs. Chest pain can also be caused by conditions that are not life-threatening. If you have chest pain, it is very important to follow up with your health care provider. CAUSES  Chest pain can be caused by:  Heartburn.  Pneumonia or bronchitis.  Anxiety or stress.  Inflammation around your heart (pericarditis) or lung (pleuritis or pleurisy).  A blood clot in your lung.  A collapsed lung (pneumothorax). It can develop suddenly on its own (spontaneous pneumothorax) or from trauma to the chest.  Shingles infection (varicella-zoster virus).  Heart attack.  Damage to the bones, muscles, and cartilage that make up your chest wall. This can include:  Bruised bones due to injury.  Strained muscles or cartilage due to frequent or repeated coughing or overwork.  Fracture to one or more ribs.  Sore cartilage due to inflammation (costochondritis). RISK FACTORS  Risk factors for chest pain may include:  Activities that increase your risk for trauma or injury to your chest.  Respiratory infections or conditions that cause frequent coughing.  Medical conditions or overeating that can cause heartburn.  Heart disease or family history of heart disease.  Conditions or health behaviors that increase your risk of developing a blood clot.  Having had chicken pox  (varicella zoster). SIGNS AND SYMPTOMS Chest pain can feel like:  Burning or tingling on the surface of your chest or deep in your chest.  Crushing, pressure, aching, or squeezing pain.  Dull or sharp pain that is worse when you move, cough, or take a deep breath.  Pain that is also felt in your back, neck, shoulder, or arm, or pain that spreads to any of these areas. Your chest pain may come and go, or it may stay constant. DIAGNOSIS Lab tests or other studies may be needed to find the cause of your pain. Your health care provider may have you take a test called an ambulatory ECG (electrocardiogram). An ECG records your heartbeat patterns at the time the test is performed. You may also have other tests, such as:  Transthoracic echocardiogram (TTE). During echocardiography, sound waves are used to create a picture of all of the heart structures and to look at how blood flows through your heart.  Transesophageal echocardiogram (TEE).This is a more advanced imaging test that obtains images from inside your body. It allows your health care provider to see your heart in finer detail.  Cardiac monitoring. This allows your health care provider to monitor your heart rate and rhythm in real time.  Holter monitor. This is a portable device that records your heartbeat and can help to diagnose abnormal heartbeats. It allows your health care provider to track your heart activity for several days, if needed.  Stress tests. These can be done through exercise or by taking medicine that makes your heart beat more quickly.  Blood tests.  Imaging  tests. TREATMENT  Your treatment depends on what is causing your chest pain. Treatment may include:  Medicines. These may include:  Acid blockers for heartburn.  Anti-inflammatory medicine.  Pain medicine for inflammatory conditions.  Antibiotic medicine, if an infection is present.  Medicines to dissolve blood clots.  Medicines to treat coronary  artery disease.  Supportive care for conditions that do not require medicines. This may include:  Resting.  Applying heat or cold packs to injured areas.  Limiting activities until pain decreases. HOME CARE INSTRUCTIONS  If you were prescribed an antibiotic medicine, finish it all even if you start to feel better.  Avoid any activities that bring on chest pain.  Do not use any tobacco products, including cigarettes, chewing tobacco, or electronic cigarettes. If you need help quitting, ask your health care provider.  Do not drink alcohol.  Take medicines only as directed by your health care provider.  Keep all follow-up visits as directed by your health care provider. This is important. This includes any further testing if your chest pain does not go away.  If heartburn is the cause for your chest pain, you may be told to keep your head raised (elevated) while sleeping. This reduces the chance that acid will go from your stomach into your esophagus.  Make lifestyle changes as directed by your health care provider. These may include:  Getting regular exercise. Ask your health care provider to suggest some activities that are safe for you.  Eating a heart-healthy diet. A registered dietitian can help you to learn healthy eating options.  Maintaining a healthy weight.  Managing diabetes, if necessary.  Reducing stress. SEEK MEDICAL CARE IF:  Your chest pain does not go away after treatment.  You have a rash with blisters on your chest.  You have a fever. SEEK IMMEDIATE MEDICAL CARE IF:   Your chest pain is worse.  You have an increasing cough, or you cough up blood.  You have severe abdominal pain.  Yo  u have severe weakness.  You faint.  You have chills.  You have sudden, unexplained chest discomfort.  You have sudden, unexplained discomfort in your arms, back, neck, or jaw.  You have shortness of breath at any time.  You suddenly start to sweat, or your  skin gets clammy.  You feel nauseous or you vomit.  You suddenly feel light-headed or dizzy.  Your heart begins to beat quickly, or it feels like it is skipping beats. These symptoms may represent a serious problem that is an emergency. Do not wait to see if the symptoms will go away. Get medical help right away. Call your local emergency services (911 in the [place] Do not drive yourself to the hospital.   This information is not intended to replace advice given to you by your health care provider. Make sure you discuss any questions you have with your health care provider.   Document Released: 08/24/2005 Document Revised: 12/05/2014 Document Reviewed: 06/20/2014 Elsevier Interactive Patient Education 2016 [employer].  Gastroesophageal Reflux Disease, Adult Normally, food travels down the esophagus and stays in the stomach to be digested. However, when a person has gastroesophageal reflux disease (GERD), food and stomach acid move back up into the esophagus. When this happens, the esophagus becomes sore and inflamed. Over time, GERD can create small holes (ulcers) in the lining of the esophagus.  CAUSES This condition is caused by a problem with the muscle between the esophagus and the stomach (lower esophageal sphincter, or LES).  Normally, the LES muscle closes after food passes through the esophagus to the stomach. When the LES is weakened or abnormal, it does not close properly, and that allows food and stomach acid to go back up into the esophagus. The LES can be weakened by certain dietary substances, medicines, and medical conditions, including:  Tobacco use.  Pregnancy.  Having a hiatal hernia.  Heavy alcohol use.  Certain foods and beverages, such as coffee, chocolate, onions, and peppermint. RISK FACTORS This condition is more likely to develop in:  People who have an increased body weight.  People who have connective tissue disorders.  People who use NSAID  medicines. SYMPTOMS Symptoms of this condition include:  Heartburn.  Difficult or painful swallowing.  The feeling of having a lump in the throat.  Abitter taste in the mouth.  Bad breath.  Having a large amount of saliva.  Having an upset or bloated stomach.  Belching.  Chest pain.  Shortness of breath or wheezing.  Ongoing (chronic) cough or a night-time cough.  Wearing away of tooth enamel.  Weight loss. Different conditions can cause chest pain. Make sure to see your health care provider if you experience chest pain. DIAGNOSIS Your health care provider will take a medical history and perform a physical exam. To determine if you have mild or severe GERD, your health care provider may also monitor how you respond to treatment. You may also have other tests, including:  An endoscopy toexamine your stomach and esophagus with a small camera.  A test thatmeasures the acidity level in your esophagus.  A test thatmeasures how much pressure is on your esophagus.  A barium swallow or modified barium swallow to show the shape, size, and functioning of your esophagus. TREATMENT The goal of treatment is to help relieve your symptoms and to prevent complications. Treatment for this condition may vary depending on how severe your symptoms are. Your health care provider may recommend:  Changes to your diet.  Medicine.  Surgery. HOME CARE INSTRUCTIONS Diet  Follow a diet as recommended by your health care provider. This may involve avoiding foods and drinks such as:  Coffee and tea (with or without caffeine).  Drinks that containalcohol.  Energy drinks and sports drinks.  Carbonated drinks or sodas.  Chocolate and cocoa.  Peppermint and mint flavorings.  Garlic and onions.  Horseradish.  Spicy and acidic foods, including peppers, chili powder, curry powder, vinegar, hot sauces, and barbecue sauce.  Citrus fruit juices and citrus fruits, such as oranges,  lemons, and limes.  Tomato-based foods, such as red sauce, chili, salsa, and pizza with red sauce.  Fried and fatty foods, such as donuts, french fries, potato chips, and high-fat dressings.  High-fat meats, such as hot dogs and fatty cuts of red and white meats, such as rib eye steak, sausage, ham, and bacon.  High-fat dairy items, such as whole milk, butter, and cream cheese.  Eat small, frequent meals instead of large meals.  Avoid drinking large amounts of liquid with your meals.  Avoid eating meals during the 2-3 hours before bedtime.  Avoid lying down right after you eat.  Do not exercise right after you eat. General Instructions  Pay attention to any changes in your symptoms.  Take over-the-counter and prescription medicines only as told by your health care provider. Do not take aspirin, ibuprofen, or other NSAIDs unless your health care provider told you to do so.  Do not use any tobacco products, including cigarettes, chewing tobacco, and  e-cigarettes. If you need help quitting, ask your health care provider.  Wear loose-fitting clothing. Do not wear anything tight around your waist that causes pressure on your abdomen.  Raise (elevate) the head of your bed 6 inches (15cm).  Try to reduce your stress, such as with yoga or meditation. If you need help reducing stress, ask your health care provider.  If you are overweight, reduce your weight to an amount that is healthy for you. Ask your health care provider for guidance about a safe weight loss goal.  Keep all follow-up visits as told by your health care provider. This is important. SEEK MEDICAL CARE IF:  You have new symptoms.  You have unexplained weight loss.  You have difficulty swallowing, or it hurts to swallow.  You have wheezing or a persistent cough.  Your symptoms do not improve with treatment.  You have a hoarse voice. SEEK IMMEDIATE MEDICAL CARE IF:  You have pain in your arms, neck, jaw,  teeth, or back.  You feel sweaty, dizzy, or light-headed.  You have chest pain or shortness of breath.  You vomit and your vomit looks like blood or coffee grounds.  You faint.  Your stool is bloody or black.  You cannot swallow, drink, or eat.   This information is not intended to replace advice given to you by your health care provider. Make sure you discuss any questions you have with your health care provider.   Document Released: 08/24/2005 Document Revised: 08/05/2015 Document Reviewed: 03/11/2015 Elsevier Interactive Patient Education [employer].

## 2016-01-22 ENCOUNTER — Ambulatory Visit (INDEPENDENT_AMBULATORY_CARE_PROVIDER_SITE_OTHER): Payer: Self-pay | Admitting: Internal Medicine

## 2016-01-22 ENCOUNTER — Other Ambulatory Visit: Payer: Self-pay | Admitting: Internal Medicine

## 2016-01-22 VITALS — BP 139/69 | HR 93 | Temp 98.3°F | Ht 73.0 in | Wt 300.3 lb

## 2016-01-22 DIAGNOSIS — E1140 Type 2 diabetes mellitus with diabetic neuropathy, unspecified: Secondary | ICD-10-CM

## 2016-01-22 DIAGNOSIS — Z794 Long term (current) use of insulin: Principal | ICD-10-CM

## 2016-01-22 DIAGNOSIS — E118 Type 2 diabetes mellitus with unspecified complications: Principal | ICD-10-CM

## 2016-01-22 DIAGNOSIS — E1165 Type 2 diabetes mellitus with hyperglycemia: Secondary | ICD-10-CM

## 2016-01-22 DIAGNOSIS — Z6839 Body mass index (BMI) 39.0-39.9, adult: Secondary | ICD-10-CM

## 2016-01-22 DIAGNOSIS — E1149 Type 2 diabetes mellitus with other diabetic neurological complication: Secondary | ICD-10-CM

## 2016-01-22 DIAGNOSIS — Z9114 Patient's other noncompliance with medication regimen: Secondary | ICD-10-CM

## 2016-01-22 DIAGNOSIS — IMO0002 Reserved for concepts with insufficient information to code with codable children: Secondary | ICD-10-CM

## 2016-01-22 LAB — GLUCOSE, CAPILLARY: Glucose-Capillary: 124 mg/dL — ABNORMAL HIGH (ref 65–99)

## 2016-01-22 LAB — POCT GLYCOSYLATED HEMOGLOBIN (HGB A1C): HEMOGLOBIN A1C: 9.3

## 2016-01-22 MED ORDER — GLUCOSE BLOOD VI STRP: ORAL_STRIP | Status: DC

## 2016-01-22 MED ORDER — GABAPENTIN 300 MG PO CAPS: 300.0000 mg | ORAL_CAPSULE | Freq: Three times a day (TID) | ORAL | Status: DC

## 2016-01-22 MED ORDER — ATORVASTATIN CALCIUM 40 MG PO TABS: 40.0000 mg | ORAL_TABLET | Freq: Every day | ORAL | Status: DC

## 2016-01-22 MED ORDER — CANAGLIFLOZIN 100 MG PO TABS: 100.0000 mg | ORAL_TABLET | Freq: Every day | ORAL | Status: DC

## 2016-01-22 MED ORDER — INSULIN NPH ISOPHANE & REGULAR (70-30) 100 UNIT/ML ~~LOC~~ SUSP: 55.0000 [IU] | Freq: Two times a day (BID) | SUBCUTANEOUS | Status: DC

## 2016-01-22 MED ORDER — EMPAGLIFLOZIN 10 MG PO TABS: 10.0000 mg | ORAL_TABLET | Freq: Every day | ORAL | Status: DC

## 2016-01-22 NOTE — Patient Instructions (Addendum)
For your diabetes, please take the following medications.   -STOP Lantus and Novolog -START 70/30 55 units before breakfast and before dinner -START Invokana 100mg  daily -CONTINUE metformin 1000mg  twice daily  Please see [name] back in 1 month with your glucometer to see how you're doing.

## 2016-01-22 NOTE — Progress Notes (Signed)
   Subjective:    [name], female    DOB: May 10, 1961, 55 y.o.   MRN: [id number]  HPI [name] is a 55 year old female with poorly controlled type 2 diabetes complicated by peripheral neuropathy, hypertension, GERD, asthma who presents today for diabetes follow-up. Please see assessment & plan for status of chronic medical problems.    Review of Systems  Constitutional: Negative for appetite change.  Respiratory: Negative for shortness of breath.   Cardiovascular: Negative for chest pain and leg swelling.  Gastrointestinal: Negative for nausea, vomiting, abdominal pain and diarrhea.  Neurological: Negative for dizziness.       Objective:   Physical Exam  Constitutional: She is oriented to person, place, and time. She appears well-developed and well-nourished. No distress.  Morbidly obese, middle-aged African-American female.  HENT:  Head: Normocephalic and atraumatic.  Eyes: Conjunctivae are normal. No scleral icterus.  Neck: No tracheal deviation present.  Cardiovascular: Normal rate and regular rhythm.  Exam reveals no gallop and no friction rub.   No murmur heard. Pulmonary/Chest: Effort normal and breath sounds normal. No stridor. No respiratory distress. She has no wheezes. She has no rales.  Neurological: She is alert and oriented to person, place, and time.  Skin: Skin is warm and dry. She is not diaphoretic.  Psychiatric: She has a normal mood and affect. Her behavior is normal.          Assessment & Plan:

## 2016-01-23 ENCOUNTER — Encounter: Payer: Self-pay | Admitting: Internal Medicine

## 2016-01-23 NOTE — Assessment & Plan Note (Signed)
Overview She is requesting a refill of her gabapentin. She has currently been taking her daughter's medication: 600 mg once daily.  Assessment Diabetic neuropathy  Plan Refilled gabapentin 300 mg 3 times daily

## 2016-01-23 NOTE — Assessment & Plan Note (Addendum)
Overview A1c 9.3 today, improved from 10.0 back in November. She acknowledges doing which she is supposed to by taking Lantus 6 units at bedtime and NovoLog 15 units with meals 3 times daily that she does acknowledge missing doses frequently when she has to leave the house. Though she had run out of metformin at her last visit which she attributes to her elevated A1c, she has been taking metformin 1000 mg twice daily since her last appointment. She reminisces back to the time when she was on Novolin 70/30 at which time her A1c was under 6 but was switched off of that when she became a patient in our clinic. She acknowledges she can do better when it comes to eating and would like to lose weight as the means to improve her diabetic control. She denies any symptoms of hypoglycemia, like nausea, vomiting, abdominal pain, diaphoresis, dizziness.   For her annual eye exam, she would like to follow-up with Dr. Zenia Resides office as she does not like the retinal scanner in our office.  Assessment Poorly controlled insulin-dependent diabetes. She'll require triple therapy in addition to lifestyle modification as a means to improve her glycemic control. Though she has as history of presyncope, I think an SGLT 2 inhibitor could help with some weight loss.  Plan -Advised her to stop taking Lantus and NovoLog as she has been nonadherent with this therapy as reflected by her elevated A1c and will prescribe Novolin 70/30 55 units twice daily with meals as previously directed -Instructed her to call the on-call provider immediately should her CBG fall under 100 or should she experience symptoms of hypoglycemia to which she acknowledged understanding -Follow-up in 1 month and with me in April after I return -Refer to Ms. [name] Plyler [clinical diabetic educator] -Refill Lipitor 40 mg as she is currently not on statin therapy -Started canagliflozin 100 mg daily before breakfast and provided her with a 30 day discount card  while at health department fills her medication for her as it can aid in weight loss. She'll need a repeat BMET at follow-up if she has been taking this medication to assess her renal function.

## 2016-01-23 NOTE — Assessment & Plan Note (Signed)
Overview Her weight today is 300 pounds, and she would like to lose weight. She reports her daughter was able to lose 50 pounds in 6 months though feels that is too rapid for her to achieve. She believes she needs to exercise more though feels her joints limit her from rigorous physical activity.  Assessment Morbidly obese, BMI 39.4. Review of the chart is notable for 50 pound weight gain over the last 3 years. Her self-awareness with regards to her weight and her interest in losing weight is promising.  Plan -Provided counseling on safe weight loss measures: 10% weight loss [30 lbs] with no more than 1-2 lbs per week -Recommended using a calorie counting strategy: phone app -Emphasized the gains from caloric restriction are more sustainable then physical activity alone -Plan to follow-up in 2 months to reassess her progress

## 2016-01-25 NOTE — Progress Notes (Signed)
Internal Medicine Clinic Attending  Case discussed with Dr. Patel,Rushil at the time of the visit.  We reviewed the resident's history and exam and pertinent patient test results.  I agree with the assessment, diagnosis, and plan of care documented in the resident's note.  

## 2016-01-26 ENCOUNTER — Telehealth: Payer: Self-pay | Admitting: Internal Medicine

## 2016-01-26 NOTE — Telephone Encounter (Signed)
Gave health dept rx info on novolin 70/30-

## 2016-01-26 NOTE — Telephone Encounter (Signed)
LVM for health dept to call us back for verbal on the script since it was faxed on 2/24

## 2016-01-26 NOTE — Telephone Encounter (Signed)
Called pt to inform, LVM.

## 2016-01-26 NOTE — Telephone Encounter (Signed)
Pt states Novolin 70/30 is not at the health department. Please call pt back.

## 2016-01-27 NOTE — Telephone Encounter (Signed)
rec'd call back today from [facility] with MAP program.  Pt will not be able to get Novolin 70/30 from [facility] until she provides the nesc. Paperwork the drug company requires to provide the medication free.  Per [facility] pt last informed 11/30/15 that they could not provide any additional samples, she must comply with paperwork.  IF patient calls back to clinic we should direct her to turn in her tax documentation.

## 2016-02-19 ENCOUNTER — Ambulatory Visit (INDEPENDENT_AMBULATORY_CARE_PROVIDER_SITE_OTHER): Payer: Self-pay | Admitting: Internal Medicine

## 2016-02-19 ENCOUNTER — Encounter: Payer: Self-pay | Admitting: Dietician

## 2016-02-19 ENCOUNTER — Ambulatory Visit (INDEPENDENT_AMBULATORY_CARE_PROVIDER_SITE_OTHER): Payer: Self-pay | Admitting: Dietician

## 2016-02-19 VITALS — BP 140/69 | HR 94 | Temp 98.0°F | Ht 73.0 in | Wt 301.9 lb

## 2016-02-19 DIAGNOSIS — Z713 Dietary counseling and surveillance: Secondary | ICD-10-CM

## 2016-02-19 DIAGNOSIS — Z794 Long term (current) use of insulin: Secondary | ICD-10-CM

## 2016-02-19 DIAGNOSIS — E118 Type 2 diabetes mellitus with unspecified complications: Principal | ICD-10-CM

## 2016-02-19 DIAGNOSIS — E1165 Type 2 diabetes mellitus with hyperglycemia: Secondary | ICD-10-CM

## 2016-02-19 DIAGNOSIS — Z6839 Body mass index (BMI) 39.0-39.9, adult: Secondary | ICD-10-CM

## 2016-02-19 DIAGNOSIS — E1140 Type 2 diabetes mellitus with diabetic neuropathy, unspecified: Secondary | ICD-10-CM

## 2016-02-19 DIAGNOSIS — E669 Obesity, unspecified: Secondary | ICD-10-CM

## 2016-02-19 LAB — GLUCOSE, CAPILLARY: Glucose-Capillary: 194 mg/dL — ABNORMAL HIGH (ref 65–99)

## 2016-02-19 MED ORDER — FLUCONAZOLE 100 MG PO TABS: 100.0000 mg | ORAL_TABLET | Freq: Every day | ORAL | Status: AC

## 2016-02-19 NOTE — Patient Instructions (Signed)
See you in April ?

## 2016-02-19 NOTE — Progress Notes (Signed)
   Subjective:    [name], female    DOB: 1961-08-30, 55 y.o.   MRN: [id number]  HPI  55 yo female with hxo f HTN, asthma, GERD, uncontrolled DM II, peripheral neuropathy, here for follow up of her Diabetes.  Last a1c 9.3 on 01/22/16 (from 10 in November). Was noncompliant with her lantus and novolog, so she was swiched to Novolin 70/30 55 units BID. Was also started on canagliflozin 100mg  daily to help her with diabetes control and weight loss. She did not take this as she saw lot of side effects on TV. Worried about yeast infection too while taking this. She states she would feel more comfortable taking it if she had something for vaginal yeast infection.   She is on Metformin 1000mg  BID.  Did not bring her meter. States twice a day injection of novolin is much better for her than 4 times (lantus + 3 meal times). Her home readings per patient are 110-120's fasting and high 100's after meal.   Denies any other problems.   Review of Systems  Constitutional: Negative for fever and chills.  HENT: Negative for congestion and sore throat.   Eyes: Negative for photophobia and visual disturbance.  Respiratory: Negative for chest tightness, shortness of breath and wheezing.   Cardiovascular: Negative for chest pain, palpitations and leg swelling.  Gastrointestinal: Negative for abdominal pain and abdominal distention.  Endocrine: Negative for polydipsia and polyuria.  Genitourinary: Negative for dysuria and flank pain.  Musculoskeletal: Negative for back pain and arthralgias.  Neurological: Negative for dizziness, seizures, numbness and headaches.       Objective:   Physical Exam  Constitutional: She is oriented to person, place, and time. She appears well-developed and well-nourished. No distress.  Overweight female.  HENT:  Head: Normocephalic and atraumatic.  Mouth/Throat: Oropharynx is clear and moist.  Eyes: EOM are normal. Pupils are equal, round, and reactive to  light. Right eye exhibits no discharge. Left eye exhibits no discharge. No scleral icterus.  Neck: Normal range of motion. No JVD present.  Cardiovascular: Normal rate and regular rhythm.  Exam reveals no gallop and no friction rub.   No murmur heard. Pulmonary/Chest: Effort normal and breath sounds normal. No respiratory distress. She has no wheezes.  Abdominal: Soft. Bowel sounds are normal. She exhibits no distension. There is no tenderness.  Musculoskeletal: Normal range of motion. She exhibits no edema or tenderness.  Neurological: She is alert and oriented to person, place, and time. No cranial nerve deficit. Coordination normal.  Skin: Skin is warm. She is not diaphoretic.  Psychiatric: She has a normal mood and affect.    Filed Vitals:   02/19/16 0939  BP: 140/69  Pulse: 94  Temp: 98 F (36.7 C)        Assessment & Plan:  See problem based a&p.

## 2016-02-19 NOTE — Progress Notes (Signed)
  Medical Nutrition Therapy:  Appt start time: 1030 end time:  1130. Visit # 1 in this series of at least 4 recommended  Assessment:  Primary concerns today: weight management.  Patient presents for assistance with weight loss. She says she wants to drink two shakes a day and eat one sensible meal in the evening to do that. She reports little activity due to pain and amputations of toes. She feels that she has accepted her toe amputations and is ready to move on. She reports that once she starts eating she has a hard time stopping and that she is an "  emotional eater". And has been depressed over the past year or so but not taking any medications for it. Her grandchildren stay with her and help with her depression but she reports are not a healthy influence on her food choices.    Preferred Learning Style: No preference indicated  Learning Readiness: Ready  ANTHROPOMETRICS: weight-302#, BMI-39.84 WEIGHT HISTORY:current weight is the highest she has been,  SLEEP:not discussed today, patient did not indicate any problems MEDICATIONS: gabapentin, insulin  can cause weight gain BLOOD SUGAR:A1C >9%, blood sugars not discussed otherwise DIETARY INTAKE: Usual eating pattern 3 meals and 1-2 snacks 24-hr recall:  B ( AM): large bagel and 3 eggs L ( PM): salad with chicken and homemade low fat dressing D ( PM): stir fry vegetables with chicken or beef Beverages:not discussed today  Usual physical activity: adls  Estimated daily energy needs: 2000-2100 calories/day for ~ 1 #/day weight loss   Progress Towards Goal(s):  In progress.   Nutritional Diagnosis:  NB-1.1 Food and nutrition-related knowledge deficit As related to lack of sufficient diabetes meal planning .  As evidenced by her report and questions.    Intervention:  Nutrition education:  medicine affects on weight; helped her set up Myfitnesspal and education on calorie counting and adjusting her insulin if needed.   Teaching Method  Utilized: Visual, Auditory, Hands on Handouts given during visit include: 3 booklets on diabetes meal planning, Barriers to learning/adherence to lifestyle change: her food environment, material resources,competing priorities Demonstrated degree of understanding via:  Teach Back   Monitoring/Evaluation:  Dietary intake, exercise, meter, and body weight in 4 week(s).

## 2016-02-19 NOTE — Patient Instructions (Signed)
Please start taking the invokana. Gave you diflucan for yeast infection in case you have one. If you do, take 1 single dose of diflucan for treatment.  Continue your current insulin. Check your sugar daily at least 3 times and bring your meter next visit.  Follow up in 1 month.

## 2016-02-19 NOTE — Assessment & Plan Note (Signed)
Patient states compliance with 70/30 55 units BID insulin. Did not bring meter but fasting sugars in 120's and post meal high 100's. No hypoglycemia. Has not started invokana as she was afraid of the side effects including vaginal yeast infection.   - will cont 70/30 novolin 55 units BID for now. Asked to bring meter next visit and this can be further titrate that time. - gave her diflucan just in case she has yeast infection, with this in hand patient states she will be more comfortable taking the invokana. She will start this now. - continue metformin 1000mg  bid.  F/up in 1 month.

## 2016-02-22 NOTE — Progress Notes (Signed)
Internal Medicine Clinic Attending  Case discussed with Dr. Ahmed at the time of the visit.  We reviewed the resident's history and exam and pertinent patient test results.  I agree with the assessment, diagnosis, and plan of care documented in the resident's note. 

## 2016-03-17 ENCOUNTER — Telehealth: Payer: Self-pay

## 2016-03-18 ENCOUNTER — Ambulatory Visit (HOSPITAL_COMMUNITY)
Admission: RE | Admit: 2016-03-18 | Discharge: 2016-03-18 | Disposition: A | Discharge status: Home / Self Care | Payer: Self-pay | Source: Ambulatory Visit | Attending: Internal Medicine | Admitting: Internal Medicine

## 2016-03-18 ENCOUNTER — Encounter: Payer: Self-pay | Admitting: Internal Medicine

## 2016-03-18 ENCOUNTER — Ambulatory Visit: Payer: Self-pay | Admitting: Dietician

## 2016-03-18 ENCOUNTER — Ambulatory Visit (INDEPENDENT_AMBULATORY_CARE_PROVIDER_SITE_OTHER): Payer: Self-pay | Admitting: Internal Medicine

## 2016-03-18 VITALS — BP 128/81 | HR 95 | Temp 97.8°F | Ht 73.0 in | Wt 297.5 lb

## 2016-03-18 DIAGNOSIS — L089 Local infection of the skin and subcutaneous tissue, unspecified: Secondary | ICD-10-CM | POA: Insufficient documentation

## 2016-03-18 DIAGNOSIS — E11628 Type 2 diabetes mellitus with other skin complications: Secondary | ICD-10-CM

## 2016-03-18 DIAGNOSIS — E1165 Type 2 diabetes mellitus with hyperglycemia: Secondary | ICD-10-CM

## 2016-03-18 DIAGNOSIS — B9689 Other specified bacterial agents as the cause of diseases classified elsewhere: Secondary | ICD-10-CM

## 2016-03-18 DIAGNOSIS — Z794 Long term (current) use of insulin: Secondary | ICD-10-CM

## 2016-03-18 DIAGNOSIS — E118 Type 2 diabetes mellitus with unspecified complications: Secondary | ICD-10-CM

## 2016-03-18 DIAGNOSIS — Z89412 Acquired absence of left great toe: Secondary | ICD-10-CM

## 2016-03-18 DIAGNOSIS — E1169 Type 2 diabetes mellitus with other specified complication: Secondary | ICD-10-CM | POA: Insufficient documentation

## 2016-03-18 DIAGNOSIS — IMO0002 Reserved for concepts with insufficient information to code with codable children: Secondary | ICD-10-CM

## 2016-03-18 MED ORDER — GABAPENTIN 300 MG PO CAPS: 600.0000 mg | ORAL_CAPSULE | Freq: Three times a day (TID) | ORAL | Status: DC

## 2016-03-18 MED ORDER — SULFAMETHOXAZOLE-TRIMETHOPRIM 800-160 MG PO TABS: 1.0000 | ORAL_TABLET | Freq: Two times a day (BID) | ORAL | Status: DC

## 2016-03-18 MED ORDER — CEPHALEXIN 500 MG PO TABS: 500.0000 mg | ORAL_TABLET | Freq: Four times a day (QID) | ORAL | Status: DC

## 2016-03-18 MED ORDER — TRAMADOL HCL 50 MG PO TABS: 50.0000 mg | ORAL_TABLET | Freq: Four times a day (QID) | ORAL | Status: DC | PRN

## 2016-03-18 NOTE — Progress Notes (Signed)
Subjective:   [name] female   DOB: [birth year] 55 y.o.   MRN: [id number]  HPI: [name] is a 55 y.o. woman with PMHx detailed below particular poorly controlled diabetes and osteomyelitis of the left foot presenting for a new wound on her right great toe. This started in the morning when she noticed some skin break off and fluid draining from the tip of her toe. This is not painful although she has roughly normal sensation in her feet. She denies any fevers or chills. 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 She started taking one does of oral doxycycline that she had at home and wrapped the toe herself and arranged this appointment in clinic as well as with wound care clinic on 5/10.  See problem based assessment and plan below for additional details.  Past Medical History  Diagnosis Date  . Diabetes mellitus     since 1983  . Hypertension   . Asthma 11/01/2010  . Pneumonia     as a child  . GERD (gastroesophageal reflux disease)   . Anemia   . Osteomyelitis [facility])    Current Outpatient Prescriptions  Medication Sig Dispense Refill  . atorvastatin (LIPITOR) 40 MG tablet Take 1 tablet (40 mg total) by mouth daily. 90 tablet 3  . canagliflozin (INVOKANA) 100 MG TABS tablet Take 1 tablet (100 mg total) by mouth daily before breakfast. 30 tablet 0  . Cephalexin 500 MG tablet Take 1 tablet (500 mg total) by mouth 4 (four) times daily. 56 tablet 0  . gabapentin (NEURONTIN) 300 MG capsule Take 2 capsules (600 mg total) by mouth 3 (three) times daily. 180 capsule 3  . glucose blood test strip Please check before meals and bedtime. 150 each 11  . hydrochlorothiazide (MICROZIDE) 12.5 MG capsule Take 1 capsule (12.5 mg total) by mouth daily. 90 capsule 3  . insulin NPH-regular Human (NOVOLIN 70/30) (70-30) 100 UNIT/ML injection Inject 55 Units into the skin 2 (two) times daily with a  meal. 10 mL 11  . Lancets MISC 1 each by Does not apply route 4 (four) times daily -  before meals and at bedtime. 100 each 11  . lisinopril (PRINIVIL,ZESTRIL) 10 MG tablet Take 1 tablet (10 mg total) by mouth at bedtime. 90 tablet 3  . metFORMIN (GLUCOPHAGE) 1000 MG tablet Take 1 tablet (1,000 mg total) by mouth 2 (two) times daily with a meal. 180 tablet 3  . Multiple Vitamin (MULTIVITAMIN WITH MINERALS) TABS tablet Take 1 tablet by mouth daily.    Marland Kitchen omeprazole (PRILOSEC) 20 MG capsule Take 2 capsules (40 mg total) by mouth daily. Take 2 tablets 30 minutes prior to one meal daily. 180 capsule 3  . sulfamethoxazole-trimethoprim (BACTRIM DS,SEPTRA DS) 800-160 MG tablet Take 1 tablet by mouth 2 (two) times daily. 28 tablet 0  . traMADol (ULTRAM) 50 MG tablet Take 1 tablet (50 mg total) by mouth every 6 (six) hours as needed. 60 tablet 0  . [DISCONTINUED] benazepril (LOTENSIN) 10 MG tablet Take 10 mg by mouth daily.    . [DISCONTINUED] insulin regular (NOVOLIN R,HUMULIN R) 100 units/mL injection Inject 0.15 mLs (15 Units total) into the skin 3 (three) times daily before meals. 10 mL 12  . [DISCONTINUED] potassium chloride (K-DUR) 10 MEQ tablet Take 20 mEq by mouth daily.     No current facility-administered medications for this visit.  Family History  Problem Relation Age of Onset  . Diabetes Father   . Hypertension Father   . Diabetes Daughter   . Hypertension Mother   . Dementia Mother    Social History   Social History  . Marital Status: Legally Separated    Spouse Name: N/A  . Number of Children: N/A  . Years of Education: N/A   Social History Main Topics  . Smoking status: Never Smoker   . Smokeless tobacco: Never Used  . Alcohol Use: No  . Drug Use: No  . Sexual Activity: Not Asked   Other Topics Concern  . None   Social History Narrative   Review of Systems: Review of Systems  Constitutional: Negative for fever and chills.  Respiratory: Negative for shortness of  breath.   Cardiovascular: Negative for leg swelling.  Gastrointestinal: Negative for diarrhea.  Musculoskeletal: Negative for joint pain.  Skin: Negative for rash.  Neurological: Negative for sensory change.    Objective:  Physical Exam: Filed Vitals:   03/18/16 1600  BP: 128/81  Pulse: 95  Temp: 97.8 F (36.6 C)  TempSrc: Oral  Height: 6\' 1"  (1.854 m)  Weight: 297 lb 8 oz (134.945 kg)  SpO2: 100%   GENERAL- alert, co-operative, NAD CARDIAC- RRR, no murmurs, rubs or gallops. RESP- CTAB, no wheezes or crackles. NEURO- Sensation intact to toes in right foot, normal ROM of affected toe EXTREMITIES- Clean based 1.5cm diameter wound on tip of right great toe producing serous appearing drainage, there is some swelling extending along medial aspect of the toe to the PIP joint, no extension of erythema, no tenderness, prominent deep callus under right 3rd MTP SKIN- Warm, dry PSYCH- Normal mood and affect, appropriate thought content and speech.  Assessment & Plan:

## 2016-03-18 NOTE — Patient Instructions (Addendum)
We will call you if there are any concerning findings on the foot xray obtained today.  For your foot I prescribed 2 antibiotics, Bactrim twice daily and Keflex 4 times daily. These will broadly cover most types of bacteria that can be present in a foot infection such as yours. Take them for 2 weeks or until directed otherwise.  I have also ordered for a study to check the blood flow to your legs. You will be called about arranging an appointment for this procedure. If the blood flow is poor you may be a candidate for procedures to re-open the blood vessels.  Follow up with the wound care clinic on 5/10, but if your wound seems to worsened after the next few days please call us back sooner than this to get rechecked.

## 2016-03-21 NOTE — Progress Notes (Signed)
I saw and evaluated the patient.  I personally confirmed the key portions of Dr. Rice's history and exam and reviewed pertinent patient test results.  The assessment, diagnosis, and plan were formulated together and I agree with the documentation in the resident's note. 

## 2016-03-21 NOTE — Addendum Note (Signed)
Addended by: Oval Linsey D on: 03/21/2016 08:50 AM   Modules accepted: Level of Service

## 2016-03-21 NOTE — Assessment & Plan Note (Addendum)
Assessment: New open wound on right great toe, not entirely clear if vascular vs neuropathy ulceration. Tip of the right great toe may be consistent with ischemia although prominent callus under 3rd MTP may be more consistent with a neuropathic process. 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 Pulses are not easily palpated on exam. Overall this is likely limited to soft tissue currently as a mild to moderate diabetic foot infection, but this wound is at high risk of progression or failure to heal. The patient was given very strict instruction to call back and not wait several weeks if her symptoms worsen despite starting treatment.  Plan: -2 view xray of foot to check for any obvious bony involvement -Agree with wound care visit on 5/10 -Start empiric abtx coverage for a likely polymicrobial infection with Bactrim DS BID and Keflex QID for 14 days starting 4/21 -Refill tramadol at previously taken dose during this acute event -Obtain ABIs to check for appropriateness of considering revascularization

## 2016-03-25 ENCOUNTER — Encounter: Payer: Self-pay | Admitting: Internal Medicine

## 2016-03-28 ENCOUNTER — Telehealth: Payer: Self-pay

## 2016-03-28 NOTE — Telephone Encounter (Signed)
Pt requesting the nurse to call back. 

## 2016-03-29 NOTE — Telephone Encounter (Signed)
Spoke with patient, she states invokana was never ordered at the health department and she is requesting additional assistance from [name] or a different medication.   Per Dawn with [facility], [facility] has been requesting 2015 IRS form from patient since Jan. without success.  The drug company only allows patient to use 1 coupon for free 30 days supply.  Patient is supposed to be following up with [facility] this week.  Dr. Posey Pronto, what would you like to do?

## 2016-03-29 NOTE — Telephone Encounter (Signed)
Please recommend to her that she needs to submit this paperwork for this and any additional non-insulin therapies. Can you see what her timeline is with regards to submitting this paperwork?   I'll copy Ms. [name] to see if we have any additional options for an uninsured patient with poorly controlled diabetes.

## 2016-03-29 NOTE — Telephone Encounter (Signed)
Confirmed appointments she has with financial counselor, doctor and CDE in May

## 2016-03-29 NOTE — Telephone Encounter (Signed)
Per patient supposed to complete MAP process this week but 6-8 week turn around to receive the medication from the manufacturer.  I spoke with the pharmD resident as well in Dr. Julianne Rice absence, she was going to review and get in touch with you.

## 2016-03-29 NOTE — Telephone Encounter (Addendum)
Spoke with pharmacy student. We could not think of any viable options for her medicne wise. She was interested in using MNT and this can drop her a1C by 2%. Called her to discuss how she is doing with weight loss/MNT: She says right now MNT " is an iffy option because she is in the middle of a life crisis".  She also said that she liked the Invokana and tomorrow is her last dose.  Her blood sugars are Usually high in the afternoon- lunch is her biggest meal. Dinner is a little iffy. Told her with Dr.Patel's permission she might be abel to increase her insulin temporarily until the Invokana comes in.  Advised increase AM insulin 1-2 units every 2-3 days until her predinner blood sugar is 90-130. I told her we'd call her back if she is not to increase her am insulin ,  She says she'd like Dr. Posey Pronto to know that she was told that Invokana Rep can come out and bring samples.

## 2016-03-30 NOTE — Telephone Encounter (Addendum)
I agree that we should increase her insulin temporarily until she is able to get her medications. It is unclear to me where she is obtaining her insulin as I was under the impression that she was using her daughter's prescription my last office visit with her. I'm glad she finds Invokana useful and hope she can submit her paperwork.

## 2016-04-01 ENCOUNTER — Ambulatory Visit: Payer: Self-pay

## 2016-04-04 ENCOUNTER — Ambulatory Visit (HOSPITAL_COMMUNITY)
Admission: RE | Admit: 2016-04-04 | Discharge: 2016-04-04 | Disposition: A | Discharge status: Home / Self Care | Payer: No Typology Code available for payment source | Source: Ambulatory Visit | Attending: Internal Medicine | Admitting: Internal Medicine

## 2016-04-04 DIAGNOSIS — E1169 Type 2 diabetes mellitus with other specified complication: Secondary | ICD-10-CM

## 2016-04-04 DIAGNOSIS — L089 Local infection of the skin and subcutaneous tissue, unspecified: Secondary | ICD-10-CM | POA: Insufficient documentation

## 2016-04-04 DIAGNOSIS — Z89422 Acquired absence of other left toe(s): Secondary | ICD-10-CM | POA: Insufficient documentation

## 2016-04-04 DIAGNOSIS — E11628 Type 2 diabetes mellitus with other skin complications: Secondary | ICD-10-CM

## 2016-04-04 NOTE — Progress Notes (Signed)
VASCULAR LAB PRELIMINARY  PRELIMINARY  PRELIMINARY  PRELIMINARY   ARTERIAL  ABI completed:    RIGHT    LEFT    PRESSURE WAVEFORM  PRESSURE WAVEFORM  BRACHIAL 134 Triphasic BRACHIAL 124 Tripasic  DP 153 Biphasic DP 148 Biphasic  PT 132 Biphasic PT 139 Biphasic    RIGHT LEFT  ABI 1.14 1.10   Bilateral- ABI appears normal at rest. Although ABI appears to be normal, this mostly likely false elevated due to calcified vessel due to patient being a diabetic.  Janifer Adie, RVT 04/04/2016, 11:08 AM

## 2016-04-06 ENCOUNTER — Encounter (HOSPITAL_BASED_OUTPATIENT_CLINIC_OR_DEPARTMENT_OTHER): Payer: No Typology Code available for payment source | Attending: Surgery

## 2016-04-06 DIAGNOSIS — Z794 Long term (current) use of insulin: Secondary | ICD-10-CM | POA: Insufficient documentation

## 2016-04-06 DIAGNOSIS — Z9119 Patient's noncompliance with other medical treatment and regimen: Secondary | ICD-10-CM | POA: Insufficient documentation

## 2016-04-06 DIAGNOSIS — I10 Essential (primary) hypertension: Secondary | ICD-10-CM | POA: Insufficient documentation

## 2016-04-06 DIAGNOSIS — L97511 Non-pressure chronic ulcer of other part of right foot limited to breakdown of skin: Secondary | ICD-10-CM | POA: Insufficient documentation

## 2016-04-06 DIAGNOSIS — E1140 Type 2 diabetes mellitus with diabetic neuropathy, unspecified: Secondary | ICD-10-CM | POA: Insufficient documentation

## 2016-04-06 DIAGNOSIS — Z79899 Other long term (current) drug therapy: Secondary | ICD-10-CM | POA: Insufficient documentation

## 2016-04-06 DIAGNOSIS — E1165 Type 2 diabetes mellitus with hyperglycemia: Secondary | ICD-10-CM | POA: Insufficient documentation

## 2016-04-06 DIAGNOSIS — E11621 Type 2 diabetes mellitus with foot ulcer: Secondary | ICD-10-CM | POA: Insufficient documentation

## 2016-04-22 ENCOUNTER — Ambulatory Visit (INDEPENDENT_AMBULATORY_CARE_PROVIDER_SITE_OTHER): Payer: No Typology Code available for payment source | Admitting: Dietician

## 2016-04-22 ENCOUNTER — Encounter: Payer: Self-pay | Admitting: Internal Medicine

## 2016-04-22 ENCOUNTER — Ambulatory Visit (INDEPENDENT_AMBULATORY_CARE_PROVIDER_SITE_OTHER): Payer: No Typology Code available for payment source | Admitting: Internal Medicine

## 2016-04-22 ENCOUNTER — Ambulatory Visit: Payer: No Typology Code available for payment source

## 2016-04-22 VITALS — BP 147/63 | HR 88 | Temp 88.0°F | Ht 73.0 in | Wt 304.8 lb

## 2016-04-22 DIAGNOSIS — E785 Hyperlipidemia, unspecified: Secondary | ICD-10-CM

## 2016-04-22 DIAGNOSIS — H8110 Benign paroxysmal vertigo, unspecified ear: Secondary | ICD-10-CM

## 2016-04-22 DIAGNOSIS — E118 Type 2 diabetes mellitus with unspecified complications: Principal | ICD-10-CM

## 2016-04-22 DIAGNOSIS — E1165 Type 2 diabetes mellitus with hyperglycemia: Secondary | ICD-10-CM

## 2016-04-22 DIAGNOSIS — Z713 Dietary counseling and surveillance: Secondary | ICD-10-CM

## 2016-04-22 DIAGNOSIS — E784 Other hyperlipidemia: Secondary | ICD-10-CM

## 2016-04-22 DIAGNOSIS — E1140 Type 2 diabetes mellitus with diabetic neuropathy, unspecified: Secondary | ICD-10-CM

## 2016-04-22 DIAGNOSIS — R42 Dizziness and giddiness: Secondary | ICD-10-CM

## 2016-04-22 DIAGNOSIS — E1169 Type 2 diabetes mellitus with other specified complication: Secondary | ICD-10-CM

## 2016-04-22 DIAGNOSIS — Z6841 Body Mass Index (BMI) 40.0 and over, adult: Secondary | ICD-10-CM

## 2016-04-22 LAB — BASIC METABOLIC PANEL
Anion gap: 7 (ref 5–15)
BUN: 11 mg/dL (ref 6–20)
CHLORIDE: 102 mmol/L (ref 101–111)
CO2: 29 mmol/L (ref 22–32)
CREATININE: 0.71 mg/dL (ref 0.44–1.00)
Calcium: 9.3 mg/dL (ref 8.9–10.3)
GFR calc Af Amer: >60 mL/min (ref >60)
GFR calc non Af Amer: >60 mL/min (ref >60)
Glucose, Bld: 267 mg/dL — ABNORMAL HIGH (ref 65–99)
Potassium: 3.9 mmol/L (ref 3.5–5.1)
SODIUM: 138 mmol/L (ref 135–145)

## 2016-04-22 LAB — LIPID PANEL
CHOL/HDL RATIO: 2.6 ratio
Cholesterol: 143 mg/dL (ref 0–200)
HDL: 55 mg/dL (ref >40)
LDL Cholesterol: 68 mg/dL (ref 0–99)
Triglycerides: 101 mg/dL (ref <150)
VLDL: 20 mg/dL (ref 0–40)

## 2016-04-22 LAB — POCT GLYCOSYLATED HEMOGLOBIN (HGB A1C): Hemoglobin A1C: 8.4

## 2016-04-22 LAB — GLUCOSE, CAPILLARY: Glucose-Capillary: 251 mg/dL — ABNORMAL HIGH (ref 65–99)

## 2016-04-22 MED ORDER — BLOOD GLUCOSE METER KIT: PACK | Status: DC

## 2016-04-22 MED ORDER — GLUCOSE BLOOD VI STRP: ORAL_STRIP | Status: DC

## 2016-04-22 NOTE — Patient Instructions (Signed)
For the dizziness, we will work on getting you to vestibular rehab for the exercises.  Please ask [name] about the glucometer.  Keep doing what you are doing as we are trying to get back on track!

## 2016-04-22 NOTE — Progress Notes (Signed)
   Subjective:    [name], female    DOB: Jan 24, 1961, 55 y.o.   MRN: [id number]  HPI [name] is a 55 year old female who presents today for diabetes. Please see assessment & plan for status of chronic medical problems.    Review of Systems     Objective:   Physical Exam  Constitutional: She is oriented to person, place, and time. She appears well-developed and well-nourished. No distress.  Morbidly obese, middle-aged African-American female.  HENT:  Head: Normocephalic and atraumatic.  Eyes: Conjunctivae are normal. No scleral icterus.  Neck: No tracheal deviation present.  Cardiovascular: Normal rate and regular rhythm.  Exam reveals no gallop and no friction rub.   No murmur heard. Pulmonary/Chest: Effort normal and breath sounds normal. No stridor. No respiratory distress. She has no wheezes. She has no rales.  Neurological: She is alert and oriented to person, place, and time.  Skin: Skin is warm and dry. She is not diaphoretic.  Psychiatric: She has a normal mood and affect. Her behavior is normal.          Assessment & Plan:

## 2016-04-22 NOTE — Assessment & Plan Note (Addendum)
Assessment She finds her dizziness reoccurred about 2 months ago when she went to go watch one of her granddaughters cheerleading performances and felt an acute onset of "room spinning" sensation when she looked up into the bleachers. She did not have syncope at this time though had to close her eyes for the sensation to abate. She feels these episodes last for seconds at a time and are not associated with auditory symptoms, like hearing loss, tinnitus. She does however have diaphoresis and a feeling of warmth immediately thereafter these episodes though they are not associated with cardiac symptoms, like chest pain, palpitations, or difficulty breathing.  She has been worked up with brain imaging in the past which has been unremarkable for mass lesions. Vestibular rehabilitation was a consideration in the past, and I think it is certainly worthwhile to give this a try given how recurrent these episodes are. Meclizine would not have much of a role here if this is truly BPPV as positional changes are the precipitants of her symptoms.  Plan -Refer for vestibular rehabilitation -Check BMET  ADDENDUM 04/26/2016  2:08 PM:  BMET reassuring for no causes.

## 2016-04-22 NOTE — Patient Instructions (Signed)
Try to eat about 60 grams carb for meals  Maybe 75-90 if more active.  For example-  1/2 cup cooked oatmeal, 1 slice bread, 1.5 cups non starchy veggie(aspargus, peppers, broccoli, greens)  or 1/2 cup starchy vegetable (pintos, potatoes,corn)  Etc.... = 15 grams of carbohydrate  60 grams carb looks like:  1 cup mac & cheese, 1 cup pintos, 1 pork chop, water

## 2016-04-22 NOTE — Assessment & Plan Note (Addendum)
Assessment Since her last visit, she reports resolution of her foot infection after completion of total antibiotic therapy. She is frustrated she does not have insurance coverage to purchase the medication she needs, including intercondylar which she found incredibly helpful despite the doubts that she first had when I prescribed a 30 day trial in February. She has also not started Novolin 70/30 and is instead using her daughter's insulin: Lantus 70 units at bedtime and Novolin regular insulin 15 units with meals. She does not have a glucometer with her and has misplaced it due to housing issues as she is currently in the process of moving and would like a prescription for another one. She finds weight loss incredibly difficult as she is unable to exercise due to the neuropathy that she experiences in bilateral feet. She otherwise denies signs of hyperglycemia, like polyuria, polydipsia.   Plan -Check A1c today -Follow-up with clinic diabetic educator today or new meter prescription  ADDENDUM 04/22/2016  6:32 PM:  A1c 8.4, improved from 9.3 back on 01/22/16. Commended the patient on her improvement and encouraged her to not lose hope as she seeks insurance coverage since she will be able to refill Invokana again in July.

## 2016-04-22 NOTE — Progress Notes (Signed)
Glucometer and test strips were called into [facility] today. Prescription is in this note for you to sign.

## 2016-04-22 NOTE — Progress Notes (Signed)
  Medical Nutrition Therapy:  Appt start time: [id number] end time:  1512. Visit # 2 in this series of at least 4 recommended  Assessment:  Primary concerns today: weight management and blood sugar control.  Patient says she used MyFitness Pal for about a month and liked it. 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 She is pleased that her would healed for the first time on it's own. She is not interested in testing in [place] around meals today, but she is interested in learning about carb consistency to prevent low blood sugars at night. She does not drink alcohol and after discussion about how variable activity can also affect blood sugars, she wondered if her low blood sugars were when she was moving and more active. .     ANTHROPOMETRICS: weight-304.8#, BMI-40, no change SLEEP:not discussed today MEDICATIONS: she is using her daughter's lantus (70 units daily)  and Novolog (15 units with each meal)  right now. BLOOD SUGAR:A1C- did not bring a meter today, her a1C is improved to 8.4% DIETARY INTAKE: Usual eating pattern 3 meals and 1-2 snacks 24-hr recall:  B ( AM): 1 cup oatmeal and 1-2 hard boiled eggs Fruit or tunafish salad L ( PM): salad with chicken and homemade low fat dressing D ( PM): breaded porkchop, pintos, macaroni and cheese, water Beverages:water, diet soda  Usual physical activity: adls  Estimated daily energy needs: 2000-2100 calories/day for ~ 1 #/day weight loss   Progress Towards Goal(s):  In progress.   Nutritional Diagnosis:  NB-1.1 Food and nutrition-related knowledge deficit As related to lack of sufficient diabetes meal planning is im[proving as evidenced by her report and questions.    Intervention:  Nutrition education:  Carb consistency, importance of balanced meals, activity affect on blood sugars Teaching Method Utilized: Visual, Auditory, Hands on Handouts given during  visit include: 2 booklets on diabetes meal planning Barriers to learning/adherence to lifestyle change: her food environment, material resources,competing priorities Demonstrated degree of understanding via:  Teach Back   Monitoring/Evaluation:  Dietary intake, exercise, meter, and body weight in 4-8 week(s).

## 2016-04-26 ENCOUNTER — Ambulatory Visit: Payer: No Typology Code available for payment source

## 2016-04-26 NOTE — Assessment & Plan Note (Signed)
ADDENDUM 04/26/2016  2:06 PM:  Per 2013 AHA/ACC ASCVD risk calculator, 10-year risk is 12.7 % and 1.6 % with optimal risk factors. High intensity statin therapy is thus indicated.  Her lipid profile looks improved as compared to 2 years ago which is consistent with adherence this medication.

## 2016-04-26 NOTE — Addendum Note (Signed)
Addended by: Lalla Brothers T on: 04/26/2016 10:13 AM   Modules accepted: Level of Service

## 2016-04-26 NOTE — Progress Notes (Signed)
Internal Medicine Clinic Attending  Case discussed with Dr. Patel,Rushil at the time of the visit.  We reviewed the resident's history and exam and pertinent patient test results.  I agree with the assessment, diagnosis, and plan of care documented in the resident's note.  

## 2016-05-02 ENCOUNTER — Ambulatory Visit: Payer: No Typology Code available for payment source

## 2016-05-05 ENCOUNTER — Ambulatory Visit (INDEPENDENT_AMBULATORY_CARE_PROVIDER_SITE_OTHER): Payer: No Typology Code available for payment source | Admitting: Internal Medicine

## 2016-05-05 ENCOUNTER — Ambulatory Visit: Payer: No Typology Code available for payment source

## 2016-05-05 ENCOUNTER — Encounter: Payer: Self-pay | Admitting: Internal Medicine

## 2016-05-05 VITALS — BP 128/67 | HR 84 | Temp 98.2°F | Wt 306.4 lb

## 2016-05-05 DIAGNOSIS — E118 Type 2 diabetes mellitus with unspecified complications: Secondary | ICD-10-CM

## 2016-05-05 DIAGNOSIS — Z794 Long term (current) use of insulin: Secondary | ICD-10-CM

## 2016-05-05 DIAGNOSIS — M25512 Pain in left shoulder: Secondary | ICD-10-CM

## 2016-05-05 DIAGNOSIS — M25522 Pain in left elbow: Secondary | ICD-10-CM | POA: Insufficient documentation

## 2016-05-05 DIAGNOSIS — IMO0002 Reserved for concepts with insufficient information to code with codable children: Secondary | ICD-10-CM

## 2016-05-05 DIAGNOSIS — E1149 Type 2 diabetes mellitus with other diabetic neurological complication: Secondary | ICD-10-CM

## 2016-05-05 DIAGNOSIS — E1165 Type 2 diabetes mellitus with hyperglycemia: Secondary | ICD-10-CM

## 2016-05-05 DIAGNOSIS — E1140 Type 2 diabetes mellitus with diabetic neuropathy, unspecified: Secondary | ICD-10-CM

## 2016-05-05 HISTORY — DX: Pain in left shoulder: M25.512

## 2016-05-05 HISTORY — DX: Pain in left elbow: M25.522

## 2016-05-05 LAB — GLUCOSE, CAPILLARY: Glucose-Capillary: 213 mg/dL — ABNORMAL HIGH (ref 65–99)

## 2016-05-05 MED ORDER — GABAPENTIN 300 MG PO CAPS: 900.0000 mg | ORAL_CAPSULE | Freq: Three times a day (TID) | ORAL | Status: DC

## 2016-05-05 MED ORDER — DICLOFENAC SODIUM 1 % TD GEL: 2.0000 g | Freq: Four times a day (QID) | TRANSDERMAL | Status: DC

## 2016-05-05 MED ORDER — DULOXETINE HCL 60 MG PO CPEP
60.0000 mg | ORAL_CAPSULE | Freq: Every day | ORAL | Status: DC
Start: 2016-05-05 — End: 2016-08-16

## 2016-05-05 NOTE — Assessment & Plan Note (Signed)
Left shoulder pain likely 2/2 to rotator cuff tendonitis.  She is allergic to NSAIDS (gets heart burn, nausea).  - will do voltaren gel. May not have good penetration over this area. Discussed with the patient that next step would be steroid injection. -f/up in 1 month.

## 2016-05-05 NOTE — Patient Instructions (Signed)
Please use voltaren gel on your shoulder and elbow.  Start taking cymbalta 60mg  daily.  F/up in 1 month to re-assess your pain.

## 2016-05-05 NOTE — Assessment & Plan Note (Signed)
Has tenderness over the lateral epicondyle area. This is likely 2/2 to lateral epicondylitis.   Asked her to avoid activities that causes pain but still keep the joint mobile.  - will do voltaren gel since she is allergic to NSAIDs. - f/up in 1 month.

## 2016-05-05 NOTE — Assessment & Plan Note (Signed)
Her ankle pain (burning) along with decreased sensation is likely 2/2 diabetic neuropathy. Less likely arthritis or other causes.  Patient already self titrated up gabapentin to 900mg  TID without much relief. - will continue gabapentin 900mg  TID for now - will add cymbalta 60mg  daily - asked her to better control her diabetes in order to avoid worsening of her neuropathy. - f/up in 1 month.

## 2016-05-05 NOTE — Progress Notes (Signed)
   Subjective:    [name], female    DOB: 1961-03-13, 55 y.o.   MRN: [id number]  HPI  55 yo with uncontroled DM II hgba1c 8.4, HTN, HLD, diabetic neuropathy, BPPV here for complaint of diabetic neuropathic pain.  Neuropathic pain: taking gabapentin 600mg  TID without any relief. Having burning pain, mainly soles, on both sides. Also radiating towards the ankle.   Also has pain on left shoulder and left elbow. Started few months ago. No injury. Pain is felt mainly after doing some activity. Used to carry her "chubby" grandson per patient, but now she carries him on the right side b/c of the left sided pain. No weakness, numbness or tingling on this side.   Review of Systems  Constitutional: Negative for fever and chills.  HENT: Negative for congestion and sore throat.   Respiratory: Negative for cough and shortness of breath.   Cardiovascular: Negative for chest pain, palpitations and leg swelling.  Gastrointestinal: Negative for abdominal pain and abdominal distention.  Genitourinary: Negative for dysuria and flank pain.  Musculoskeletal: Positive for arthralgias. Negative for joint swelling.  Neurological: Negative for dizziness and numbness.       Objective:   Physical Exam  Constitutional: She is oriented to person, place, and time. She appears well-developed and well-nourished. No distress.  HENT:  Head: Normocephalic and atraumatic.  Eyes: Conjunctivae are normal. Right eye exhibits no discharge. Left eye exhibits no discharge.  Cardiovascular: Normal rate and regular rhythm.  Exam reveals no gallop and no friction rub.   No murmur heard. Pulmonary/Chest: Effort normal and breath sounds normal. No respiratory distress. She has no wheezes.  Abdominal: Soft. Bowel sounds are normal. She exhibits no distension. There is no tenderness.  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 No joint swelling or deformities on either foot. Normal ROM. Skin is  dry with some peeling. Decreased sensation upto ankle bilaterally. Good pulses.   Left shoulder has good ROM, however, has pain with active resistance especially with extension and with empty can test. Elbow joint also has pain with flexion and extension. Has some tenderness over the shoulder, has tenderness to the lateral epicondyle region.   Neurological: She is alert and oriented to person, place, and time.  Skin: She is not diaphoretic.    Filed Vitals:   05/05/16 1332  BP: 128/67  Pulse: 84  Temp: 98.2 F (36.8 C)       Assessment & Plan:  See problem based a&p.

## 2016-05-06 NOTE — Progress Notes (Signed)
Internal Medicine Clinic Attending  Case discussed with Dr. Ahmed at the time of the visit.  We reviewed the resident's history and exam and pertinent patient test results.  I agree with the assessment, diagnosis, and plan of care documented in the resident's note. 

## 2016-06-02 NOTE — Addendum Note (Signed)
Addended by: Hulan Fray on: 06/02/2016 06:37 PM   Modules accepted: Orders

## 2016-08-12 ENCOUNTER — Telehealth: Payer: Self-pay | Admitting: Internal Medicine

## 2016-08-12 NOTE — Telephone Encounter (Signed)
Pt requesting tramadol.

## 2016-08-15 ENCOUNTER — Telehealth: Payer: Self-pay | Admitting: Internal Medicine

## 2016-08-15 NOTE — Telephone Encounter (Signed)
Called pt - states she has been on Tramadol and only takes it as needed. States she does not have increase pain; "it's the same pain". When asked to come in to be evaluated - she did not think this was appropriate l stated she has a very extensive history and all her history is documented in her chart.

## 2016-08-15 NOTE — Telephone Encounter (Signed)
APT. REMINDER CALL, LMTCB °

## 2016-08-15 NOTE — Telephone Encounter (Signed)
Thank You Holley Raring

## 2016-08-15 NOTE — Telephone Encounter (Signed)
Called pt - informed her Dr Hoffman's response. Wanted to know why refillingTramadol over the phone not appropriate. Explained again - with her hx of diabetes, risk for foot infections along with the pain best if she comes in per Dr Heber Marion. She agreed to come in tomorrow afternoon @ 2:45 PM.

## 2016-08-15 NOTE — Telephone Encounter (Signed)
I still feel that a refill of tramadol over the phone would not be appropriate after review of her history as documented in the chart.Marland Kitchen

## 2016-08-15 NOTE — Telephone Encounter (Signed)
Called pt - c/o feet pain. Tramadol not on current med list but she has been on it before. Will send request to her doctor. Thanks

## 2016-08-15 NOTE — Telephone Encounter (Signed)
Review of her chart shows that she is not on chronic tramadol.  She did receive a short course in April for a diabetic foot infection and she has known diabetic neuropathy.  Tramadol would not be the ideal choice for diabetic neuropathy and I would not recommend refilling this over the phone, she is at risk for diabetic foot infections and she should be evaluated if she is having increased pain in her feet.

## 2016-08-16 ENCOUNTER — Encounter: Payer: Self-pay | Admitting: Internal Medicine

## 2016-08-16 ENCOUNTER — Ambulatory Visit (INDEPENDENT_AMBULATORY_CARE_PROVIDER_SITE_OTHER): Payer: No Typology Code available for payment source | Admitting: Internal Medicine

## 2016-08-16 VITALS — BP 154/61 | HR 94 | Temp 98.0°F | Ht 73.0 in | Wt 302.0 lb

## 2016-08-16 DIAGNOSIS — E0865 Diabetes mellitus due to underlying condition with hyperglycemia: Secondary | ICD-10-CM

## 2016-08-16 DIAGNOSIS — Z0000 Encounter for general adult medical examination without abnormal findings: Secondary | ICD-10-CM

## 2016-08-16 DIAGNOSIS — E0842 Diabetes mellitus due to underlying condition with diabetic polyneuropathy: Secondary | ICD-10-CM

## 2016-08-16 DIAGNOSIS — Z794 Long term (current) use of insulin: Secondary | ICD-10-CM

## 2016-08-16 DIAGNOSIS — Z596 Low income: Secondary | ICD-10-CM

## 2016-08-16 DIAGNOSIS — Z9114 Patient's other noncompliance with medication regimen: Secondary | ICD-10-CM

## 2016-08-16 DIAGNOSIS — Z23 Encounter for immunization: Secondary | ICD-10-CM

## 2016-08-16 DIAGNOSIS — E1159 Type 2 diabetes mellitus with other circulatory complications: Secondary | ICD-10-CM

## 2016-08-16 DIAGNOSIS — IMO0002 Reserved for concepts with insufficient information to code with codable children: Secondary | ICD-10-CM

## 2016-08-16 DIAGNOSIS — E0800 Diabetes mellitus due to underlying condition with hyperosmolarity without nonketotic hyperglycemic-hyperosmolar coma (NKHHC): Secondary | ICD-10-CM

## 2016-08-16 DIAGNOSIS — I152 Hypertension secondary to endocrine disorders: Secondary | ICD-10-CM

## 2016-08-16 DIAGNOSIS — E088 Diabetes mellitus due to underlying condition with unspecified complications: Secondary | ICD-10-CM

## 2016-08-16 DIAGNOSIS — I10 Essential (primary) hypertension: Secondary | ICD-10-CM

## 2016-08-16 DIAGNOSIS — E1140 Type 2 diabetes mellitus with diabetic neuropathy, unspecified: Secondary | ICD-10-CM

## 2016-08-16 LAB — GLUCOSE, CAPILLARY: Glucose-Capillary: 125 mg/dL — ABNORMAL HIGH (ref 65–99)

## 2016-08-16 LAB — POCT GLYCOSYLATED HEMOGLOBIN (HGB A1C): Hemoglobin A1C: 8.9

## 2016-08-16 MED ORDER — TRAMADOL HCL 50 MG PO TABS
50.0000 mg | ORAL_TABLET | Freq: Four times a day (QID) | ORAL | 0 refills | Status: AC | PRN
Start: 2016-08-16 — End: 2017-08-16

## 2016-08-16 NOTE — Patient Instructions (Signed)
Thank you for coming to clinic today. We will ask her pharmacist to give you a call, so we can figure out some prescription assistant program for you. Is really important that you take your diabetes and blood pressure medicine regularly as advised. I'm providing you with temporary supply of tramadol, you can take it at night so she will not have pain affecting her sleep. You can ask your PCP to reassess your need.

## 2016-08-16 NOTE — Progress Notes (Signed)
   CC: Worsening neuropathic pain in her feet.  HPI:  [name] is a 55 y.o. with past medical history as listed below, came to the clinic with complaint of worsening neuropathic pain in her both feet. She was asking for tramadol stating that it helped in the past, her last tramadol prescription was in April 2017. She has uncontrolled diabetes and hypertension. She is being noncompliant and not taking her medicine stating that she cannot afford it. She lives with her daughter who works in a school, therefore over the summer there was no source of income. She was out of her blood pressure medicine for the last month, she was taking her Lantus 70 units at night and NovoLog 15 units just in the morning. Metformin 1000 milligram daily instead of twice a day. She was using Neurontin 900 mg once daily, she was supposed to take that 3 times a day. she states that even when she was taking 3 times a day it was not helping and that was the reason they added Cymbalta 60 mg to her regimen during her last visit.She took Cymbalta for about a month but states that she cannot tolerate it due to excessive nausea. she also states that she cannot afford Cymbalta either. I sent Dr. Maudie Mercury a message to call her and help her find some prescription assistance.  Past Medical History:  Diagnosis Date  . Anemia   . Asthma 11/01/2010  . Diabetes mellitus    since 1983  . GERD (gastroesophageal reflux disease)   . Hypertension   . Osteomyelitis ([place])   . Pneumonia    as a child    Review of Systems:  As per HPI.  Physical Exam:  Vitals:   08/16/16 1511  BP: (!) 154/61  Pulse: 94  Temp: 98 F (36.7 C)  TempSrc: Oral  SpO2: 100%  Weight: (!) 302 lb (137 kg)  Height: 6\' 1"  (1.854 m)   General: Vital signs reviewed.  Patient is well-developed and well-nourished,obese, in no acute distress and cooperative with exam.  Cardiovascular: RRR, S1 normal, S2 normal, no murmurs, gallops, or  rubs. Pulmonary/Chest: Clear to auscultation bilaterally, no wheezes, rales, or rhonchi. Abdominal: Soft, non-tender, non-distended, BS +, no masses, organomegaly, or guarding present.  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 No joint swelling or deformities on either foot. Normal ROM.Decreased sensation upto ankle bilaterally.  Extremities: No lower extremity edema bilaterally,  pulses symmetric and intact bilaterally. No cyanosis or clubbing.  Skin: Warm, dry and intact. No rashes or erythema. Psychiatric: Normal mood and affect. speech and behavior is normal. Cognition and memory are normal.   Assessment & Plan:   See Encounters Tab for problem based charting.  Patient seen with Dr. Daryll Drown

## 2016-08-16 NOTE — Assessment & Plan Note (Signed)
BP Readings from Last 3 Encounters:  08/16/16 (!) 154/61  05/05/16 128/67  04/22/16 (!) 147/63   She has an elevated blood pressure today. She is not using her blood pressure medicine for the last month stating that she cannot afford it.  We talked about some prescription assistance programs today and will try to hook her up with some program so she can get her medicine regularly.

## 2016-08-16 NOTE — Assessment & Plan Note (Signed)
She came to the clinic with complaint of worsening neuropathic pain in her both feet. She was asking for tramadol stating that it helped in the past, her last tramadol prescription was in April 2017. She was using Neurontin 900 mg once daily, she was supposed to take that 3 times a day.She states that even when she was taking 3 times a day it was not helping and that was the reason they added Cymbalta 60 mg to her regimen during her last visit.She took Cymbalta for about a month but states that she cannot tolerate it due to excessive nausea. she also states that she cannot afford Cymbalta either.  We can switch her to Lyrica, once she will find some prescription assistance program. We provided her with tramadol today, and advised her to take it at night to so she can get a pain free sleep . She needs to be reassessed by her PCP.

## 2016-08-16 NOTE — Assessment & Plan Note (Signed)
We gave her flu shot today.

## 2016-08-16 NOTE — Assessment & Plan Note (Signed)
She has uncontrolled diabetes with A1c of 8.9 today. She is not using her medicine as prescribed due to the cost.  She was taking her Lantus 70 units at night and NovoLog 15 units just in the morning. Metformin 1000 milligram daily instead of twice a day. We explained her the risk and benefit of having a controlled versus uncontrolled diabetes. We will try to find her some prescription assistance program.

## 2016-08-18 ENCOUNTER — Other Ambulatory Visit: Payer: Self-pay | Admitting: Pharmacist

## 2016-08-18 DIAGNOSIS — E1165 Type 2 diabetes mellitus with hyperglycemia: Secondary | ICD-10-CM

## 2016-08-18 DIAGNOSIS — Z794 Long term (current) use of insulin: Principal | ICD-10-CM

## 2016-08-18 DIAGNOSIS — I10 Essential (primary) hypertension: Secondary | ICD-10-CM

## 2016-08-18 DIAGNOSIS — E118 Type 2 diabetes mellitus with unspecified complications: Principal | ICD-10-CM

## 2016-08-18 DIAGNOSIS — IMO0002 Reserved for concepts with insufficient information to code with codable children: Secondary | ICD-10-CM

## 2016-08-18 DIAGNOSIS — E1159 Type 2 diabetes mellitus with other circulatory complications: Secondary | ICD-10-CM

## 2016-08-18 DIAGNOSIS — I152 Hypertension secondary to endocrine disorders: Secondary | ICD-10-CM

## 2016-08-18 MED ORDER — LISINOPRIL 10 MG PO TABS: 10.0000 mg | ORAL_TABLET | Freq: Every day | ORAL | 0 refills | Status: DC

## 2016-08-18 MED ORDER — CANAGLIFLOZIN 100 MG PO TABS: 100.0000 mg | ORAL_TABLET | Freq: Every day | ORAL | 0 refills | Status: DC

## 2016-08-18 MED ORDER — METFORMIN HCL 1000 MG PO TABS: 1000.0000 mg | ORAL_TABLET | Freq: Two times a day (BID) | ORAL | 0 refills | Status: DC

## 2016-08-18 MED ORDER — GABAPENTIN 300 MG PO CAPS: 900.0000 mg | ORAL_CAPSULE | Freq: Three times a day (TID) | ORAL | 0 refills | Status: DC

## 2016-08-18 MED ORDER — ATORVASTATIN CALCIUM 40 MG PO TABS: 40.0000 mg | ORAL_TABLET | Freq: Every day | ORAL | 0 refills | Status: DC

## 2016-08-18 MED ORDER — HYDROCHLOROTHIAZIDE 12.5 MG PO CAPS: 12.5000 mg | ORAL_CAPSULE | Freq: Every day | ORAL | 0 refills | Status: DC

## 2016-08-18 MED FILL — GABAPENTIN 300 MG CAPSULE: 300 | 30 days supply | Qty: 270 | Fill #0

## 2016-08-18 MED FILL — LISINOPRIL 10 MG TABLET: 10 | 30 days supply | Qty: 30 | Fill #0

## 2016-08-18 MED FILL — ATORVASTATIN 40 MG TABLET: 40 | 30 days supply | Qty: 30 | Fill #0

## 2016-08-18 MED FILL — metFORMIN HCL 1000 MG TABS: 1000 | 30 days supply | Qty: 60 | Fill #0

## 2016-08-18 MED FILL — INVOKANA 100 MG TABLET: 100 | 30 days supply | Qty: 30 | Fill #0

## 2016-08-18 MED FILL — HYDROCHLOROTHIAZIDE 12.5 MG: 12.5 | 30 days supply | Qty: 30 | Fill #0

## 2016-08-18 NOTE — Progress Notes (Signed)
Patient states she is completely out of medications. Referred to [facility] outpatient pharmacy

## 2016-08-19 ENCOUNTER — Ambulatory Visit: Payer: No Typology Code available for payment source | Admitting: Pharmacist

## 2016-08-19 DIAGNOSIS — Z79899 Other long term (current) drug therapy: Secondary | ICD-10-CM

## 2016-08-19 NOTE — Progress Notes (Signed)
Medication Samples have been provided to the patient.  Drug name: LEVEMIR       Strength: 100 UNIT/ML        Qty: 2 VIALS (20 ML)  LOT: [id number]  Exp.Date: 03/2017  Dosing instructions: Inject 55 Units into the skin 2 (two) times daily with a meal.  Pt is familiar with using vials and declined additional demonstration. Review s/sx of hypoglycemia and what to do in response to low blood sugar (15 g sugar/15 minutes). Pt did state that she seldom gets hypoglycemia sx (sweating, trembling) until blood sugar gets around the 50s.  Pt currently has a BG monitor but has ran out of strips, says will be able to pick up strips from [facility] once her daughter gets paycheck.  Also provided with 2 week supply of esomeprazole (nexium) 24 hour capsule samples and instructed on use. Pt still using omeprazole for GI sx but has run out.  The patient has been instructed regarding the correct time, dose, and frequency of taking this medication, including desired effects and most common side effects.  Provided with [facility] form. Pt agreed to sign up for program and signed papers.   Francisco Ostrovsky 3:29 PM 08/19/2016

## 2016-08-22 NOTE — Progress Notes (Signed)
Patient was seen in clinic with Terald Sleeper, PharmD candidate. I agree with the assessment and plan of care documented.

## 2016-08-23 NOTE — Progress Notes (Signed)
Internal Medicine Clinic Attending  I saw and evaluated the patient.  I personally confirmed the key portions of the history and exam documented by Dr. Amin and I reviewed pertinent patient test results.  The assessment, diagnosis, and plan were formulated together and I agree with the documentation in the resident's note. 

## 2016-08-30 ENCOUNTER — Other Ambulatory Visit: Payer: Self-pay | Admitting: Pharmacist

## 2016-08-30 DIAGNOSIS — I10 Essential (primary) hypertension: Secondary | ICD-10-CM

## 2016-08-30 DIAGNOSIS — IMO0002 Reserved for concepts with insufficient information to code with codable children: Secondary | ICD-10-CM

## 2016-08-30 DIAGNOSIS — E118 Type 2 diabetes mellitus with unspecified complications: Principal | ICD-10-CM

## 2016-08-30 DIAGNOSIS — E1159 Type 2 diabetes mellitus with other circulatory complications: Secondary | ICD-10-CM

## 2016-08-30 DIAGNOSIS — Z794 Long term (current) use of insulin: Principal | ICD-10-CM

## 2016-08-30 DIAGNOSIS — I152 Hypertension secondary to endocrine disorders: Secondary | ICD-10-CM

## 2016-08-30 DIAGNOSIS — E1165 Type 2 diabetes mellitus with hyperglycemia: Secondary | ICD-10-CM

## 2016-08-30 MED ORDER — CANAGLIFLOZIN 100 MG PO TABS: 100.0000 mg | ORAL_TABLET | Freq: Every day | ORAL | 0 refills | Status: DC

## 2016-08-30 MED ORDER — INSULIN ISOPHANE & REGULAR (HUMAN 70-30)100 UNIT/ML KWIKPEN: 55.0000 [IU] | PEN_INJECTOR | Freq: Two times a day (BID) | SUBCUTANEOUS | 11 refills | Status: DC

## 2016-08-30 MED ORDER — HYDROCHLOROTHIAZIDE 12.5 MG PO CAPS: 12.5000 mg | ORAL_CAPSULE | Freq: Every day | ORAL | 0 refills | Status: DC

## 2016-08-30 MED ORDER — METFORMIN HCL 1000 MG PO TABS: 1000.0000 mg | ORAL_TABLET | Freq: Two times a day (BID) | ORAL | 0 refills | Status: DC

## 2016-08-30 MED ORDER — LISINOPRIL 10 MG PO TABS: 10.0000 mg | ORAL_TABLET | Freq: Every day | ORAL | 0 refills | Status: DC

## 2016-08-30 MED ORDER — ATORVASTATIN CALCIUM 40 MG PO TABS: 40.0000 mg | ORAL_TABLET | Freq: Every day | ORAL | 0 refills | Status: DC

## 2016-08-30 MED ORDER — GABAPENTIN 300 MG PO CAPS: 900.0000 mg | ORAL_CAPSULE | Freq: Three times a day (TID) | ORAL | 0 refills | Status: DC

## 2016-08-30 NOTE — Progress Notes (Signed)
Patient approved for Ceiba. Prescriptions transferred to pharmacy.

## 2016-09-07 ENCOUNTER — Encounter: Payer: Self-pay | Admitting: Licensed Clinical Social Worker

## 2016-09-07 NOTE — Progress Notes (Signed)
CSW placed call to [name].  Pt notified Part B was been completed and faxed to [place] office on 09/06/16.  CSW encouraged pt to complete Part A and send to [facility] to keep application together.

## 2016-09-14 ENCOUNTER — Ambulatory Visit: Payer: No Typology Code available for payment source

## 2016-09-14 ENCOUNTER — Encounter: Payer: Self-pay | Admitting: Internal Medicine

## 2016-09-16 ENCOUNTER — Other Ambulatory Visit: Payer: Self-pay | Admitting: Pharmacist

## 2016-09-16 DIAGNOSIS — K219 Gastro-esophageal reflux disease without esophagitis: Secondary | ICD-10-CM

## 2016-09-16 MED ORDER — OMEPRAZOLE 20 MG PO CPDR: 40.0000 mg | DELAYED_RELEASE_CAPSULE | Freq: Every day | ORAL | 0 refills | Status: DC

## 2016-09-16 NOTE — Progress Notes (Signed)
Patient called to request transfer of omeprazole prescription to new pharmacy. Prescription sent. Provided education on foods to avoid with GERD. Advised patient to follow up with PCP for further refills on medications in general.   Patient also requested assistance with Voltaren (diclofenac) gel access which is unavailable at [facility] pharmacy. Provided education on OTC topical and non-pharm pain management.  Patient verbalized understanding.

## 2016-09-20 ENCOUNTER — Other Ambulatory Visit: Payer: Self-pay | Admitting: Internal Medicine

## 2016-09-20 DIAGNOSIS — E118 Type 2 diabetes mellitus with unspecified complications: Secondary | ICD-10-CM

## 2016-09-20 DIAGNOSIS — I10 Essential (primary) hypertension: Secondary | ICD-10-CM

## 2016-09-20 DIAGNOSIS — E1165 Type 2 diabetes mellitus with hyperglycemia: Secondary | ICD-10-CM

## 2016-09-20 DIAGNOSIS — IMO0002 Reserved for concepts with insufficient information to code with codable children: Secondary | ICD-10-CM

## 2016-09-20 DIAGNOSIS — Z794 Long term (current) use of insulin: Secondary | ICD-10-CM

## 2016-09-20 DIAGNOSIS — E1159 Type 2 diabetes mellitus with other circulatory complications: Secondary | ICD-10-CM

## 2016-09-20 DIAGNOSIS — I152 Hypertension secondary to endocrine disorders: Secondary | ICD-10-CM

## 2016-09-21 NOTE — Telephone Encounter (Signed)
Received refill request from [facility]-meds requested was filled on 08/30/2016 for a 90-day supply. Since there are no additional refills- per pharmacy protocol, they are requesting refills to added to patient's profile for future refills.[name], Debra Dom Cassady10/25/20179:07 AM

## 2016-09-22 NOTE — Telephone Encounter (Signed)
Thank you for the heads up.  I will refill all except for the gabapentin as it appears neuropathy may be something she will want to discuss with me tomorrow.

## 2016-09-22 NOTE — Progress Notes (Deleted)
   CC: ***  HPI:  [name] is a 55 y.o.  *** who presents today for ***. Please see assessment & plan for status of chronic medical problems.  Past Medical History:  Diagnosis Date  . Anemia   . Asthma 11/01/2010  . Diabetes mellitus    since 1983  . GERD (gastroesophageal reflux disease)   . Hypertension   . Osteomyelitis ([place])   . Pneumonia    as a child    Review of Systems:  Please see each problem below for a pertinent review of systems.  Physical Exam:  There were no vitals filed for this visit. ***  On 05/05/16, her gabapentin was increased from 600 mg 3 times daily to 900 mg 3 times daily. She was also started on duloxetine 60 mg daily. On 08/16/16, she indicated she had stopped duloxetine due to nausea and was prescribed tramadol 50 mg 40 tablets to be taken every 6 hours as needed since the it had proven effective in the past.   Assessment & Plan:   See Encounters Tab for problem based charting.  Patient {GC/GE:[id number]::"discussed with","seen with"} Dr. {NAMES:[id number]::"Butcher","Granfortuna","E. Hoffman","Klima","Mullen","Narendra","Vincent"}

## 2016-09-23 ENCOUNTER — Encounter: Payer: No Typology Code available for payment source | Admitting: Internal Medicine

## 2016-09-23 ENCOUNTER — Encounter: Payer: Self-pay | Admitting: Internal Medicine

## 2016-10-27 ENCOUNTER — Telehealth: Payer: Self-pay

## 2016-10-27 NOTE — Telephone Encounter (Signed)
[name] is a 55yo AAW who was contacted via telephone for medication management. Patient reports that she has had no issues with accessing her medications since being referred to [facility] for free medication delivery. She reports complete adherence to all of her medications and believes that she has significantly better control of her diabetes. Per patient, her blood sugars are "usually in the 140s-160s and was once 206". She reports frequent episodes of hypoglycemia with symptoms of shakiness and blurry vision (also contributes to gabapentin) and manages these episodes by eating 6 skittles. Instructed patient that it may be better for her to eat hard candy instead. Patient has also reported that she is living in a hotel and will need to request address change to [facility] for medication delivery.

## 2016-10-31 ENCOUNTER — Other Ambulatory Visit: Payer: Self-pay | Admitting: Internal Medicine

## 2016-10-31 NOTE — Telephone Encounter (Signed)
Patient requesting a refill on her  traMADol (ULTRAM) 50 MG tablet

## 2016-11-01 ENCOUNTER — Telehealth: Payer: Self-pay | Admitting: Internal Medicine

## 2016-11-01 NOTE — Telephone Encounter (Signed)
APT. REMINDER CALL, LMTCB °

## 2016-11-02 ENCOUNTER — Ambulatory Visit: Payer: No Typology Code available for payment source

## 2016-11-02 ENCOUNTER — Ambulatory Visit (INDEPENDENT_AMBULATORY_CARE_PROVIDER_SITE_OTHER): Payer: No Typology Code available for payment source | Admitting: Pulmonary Disease

## 2016-11-02 ENCOUNTER — Encounter (INDEPENDENT_AMBULATORY_CARE_PROVIDER_SITE_OTHER): Payer: Self-pay

## 2016-11-02 VITALS — BP 112/71 | HR 87 | Temp 98.5°F | Wt 285.5 lb

## 2016-11-02 DIAGNOSIS — S025XXA Fracture of tooth (traumatic), initial encounter for closed fracture: Secondary | ICD-10-CM

## 2016-11-02 DIAGNOSIS — E11621 Type 2 diabetes mellitus with foot ulcer: Secondary | ICD-10-CM | POA: Insufficient documentation

## 2016-11-02 DIAGNOSIS — E1149 Type 2 diabetes mellitus with other diabetic neurological complication: Secondary | ICD-10-CM

## 2016-11-02 DIAGNOSIS — Z89412 Acquired absence of left great toe: Secondary | ICD-10-CM

## 2016-11-02 DIAGNOSIS — Z79899 Other long term (current) drug therapy: Secondary | ICD-10-CM

## 2016-11-02 DIAGNOSIS — L84 Corns and callosities: Secondary | ICD-10-CM

## 2016-11-02 DIAGNOSIS — E1165 Type 2 diabetes mellitus with hyperglycemia: Secondary | ICD-10-CM

## 2016-11-02 DIAGNOSIS — K0889 Other specified disorders of teeth and supporting structures: Secondary | ICD-10-CM

## 2016-11-02 DIAGNOSIS — E1140 Type 2 diabetes mellitus with diabetic neuropathy, unspecified: Secondary | ICD-10-CM

## 2016-11-02 DIAGNOSIS — G5762 Lesion of plantar nerve, left lower limb: Secondary | ICD-10-CM

## 2016-11-02 DIAGNOSIS — E08621 Diabetes mellitus due to underlying condition with foot ulcer: Secondary | ICD-10-CM | POA: Insufficient documentation

## 2016-11-02 DIAGNOSIS — L97529 Non-pressure chronic ulcer of other part of left foot with unspecified severity: Secondary | ICD-10-CM

## 2016-11-02 DIAGNOSIS — IMO0002 Reserved for concepts with insufficient information to code with codable children: Secondary | ICD-10-CM

## 2016-11-02 DIAGNOSIS — G5761 Lesion of plantar nerve, right lower limb: Secondary | ICD-10-CM

## 2016-11-02 DIAGNOSIS — E118 Type 2 diabetes mellitus with unspecified complications: Secondary | ICD-10-CM

## 2016-11-02 DIAGNOSIS — Z794 Long term (current) use of insulin: Secondary | ICD-10-CM

## 2016-11-02 DIAGNOSIS — G5760 Lesion of plantar nerve, unspecified lower limb: Secondary | ICD-10-CM | POA: Insufficient documentation

## 2016-11-02 HISTORY — DX: Fracture of tooth (traumatic), initial encounter for closed fracture: S02.5XXA

## 2016-11-02 LAB — GLUCOSE, CAPILLARY: Glucose-Capillary: 182 mg/dL — ABNORMAL HIGH (ref 65–99)

## 2016-11-02 LAB — POCT GLYCOSYLATED HEMOGLOBIN (HGB A1C): Hemoglobin A1C: 8.5

## 2016-11-02 MED ORDER — DICLOFENAC SODIUM 1 % TD GEL: 4.0000 g | Freq: Four times a day (QID) | TRANSDERMAL | 2 refills | Status: DC

## 2016-11-02 NOTE — Assessment & Plan Note (Signed)
Assessment: A1c down to 8.5 from 8.9. She reports improved compliance of her medications.  Plan: Continue Humulin 70/30, metformin and Invokana 100mg  She reports some episodes of feeling hypoglycemic but does not measure her blood sugar. Will ask [name] for help in getting her a new meter.

## 2016-11-02 NOTE — Progress Notes (Addendum)
Internal Medicine Clinic Attending  I saw and evaluated the patient.  I personally confirmed the key portions of the history and exam documented by Dr. Randell Patient and I reviewed pertinent patient test results.  The assessment, diagnosis, and plan were formulated together and I agree with the documentation in the resident's note.  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 The foot has other concerning findings of neuropathy including errosions and callus of the great toe and plantar surface. Left foot has had a great toe amputation. We did a point of care ultrasound in clinic of the plantar foot that shows a 1cm hyperechoic structure in the 3rd-4th interdigit space at the point of maximum tenderness, probably consistent with a long standing neuroma. I doubt metatarsal bursitis, no fluid filled structures seen. For this and the other findings of the feet we have referred her to podiatry, and gave usual instructions on conservative care.

## 2016-11-02 NOTE — Telephone Encounter (Signed)
Patient seen 12/6 to address foot pain

## 2016-11-02 NOTE — Assessment & Plan Note (Signed)
Currently her neuropathy is controlled with gabapentin. Her foot pain is more consistent with Morton neuroma and we will treat this with NSAIDs. Will keep her on current gabapentin.

## 2016-11-02 NOTE — Assessment & Plan Note (Addendum)
Assessment: Her pain and exam is more consistent with Morton neuroma. Able to visualize the neuroma on POC ultrasound.  Plan: Discussed with her proper footwear Voltaren gel QID (she does not tolerate PO ibuprofen). If she is unable to afford this, Pennsaid topical solution is available through the MAP program (would need to apply for MAP. Include dx code in body of Rx). Referral to podiatry

## 2016-11-02 NOTE — Assessment & Plan Note (Signed)
Pre-ulcerative calluses noted on her feet.   Will need diabetic footwear Referral to podiatry

## 2016-11-02 NOTE — Progress Notes (Signed)
   CC: foot pain  HPI:  [name] is a 55 y.o. woman with asthma, DM2, GERD, HTN presenting with bilateral foot pain.   Her gabapentin is not working. She had a toe removed in 2014. She had minimal symptoms back then. It has worsened over time. She has it in the plantar surface of her feet in the MTP joint region. It is a constant aching pain. It is worse when she is working because she stands a lot at work. She has numbness in her toes. Tylenol arthritis pain helps. She denies burning pain or paresthesias in other parts of her feet.  She reports her diabetes is doing well. She reports she is not eating as much. She has episodes of feeling like she is hypoglycemic. She is more compliant with her medications.    Past Medical History:  Diagnosis Date  . Anemia   . Asthma 11/01/2010  . Diabetes mellitus    since 1983  . GERD (gastroesophageal reflux disease)   . Hypertension   . Osteomyelitis ([place])   . Pneumonia    as a child    Review of Systems:   No fevers or chills No chest pain No dyspnea  Physical Exam:  Vitals:   11/02/16 0936  BP: 112/71  Pulse: 87  Temp: 98.5 F (36.9 C)  TempSrc: Oral  SpO2: 100%  Weight: 285 lb 8 oz (129.5 kg)   General Apperance: NAD HEENT: Normocephalic, atraumatic, anicteric sclera Neck: Supple, trachea midline Lungs: Clear to auscultation bilaterally. No wheezes, rhonchi or rales. Breathing comfortably Heart: Regular rate and rhythm, no murmur/rub/gallop Abdomen: Soft, nontender, nondistended, no rebound/guarding Extremities: Warm and well perfused, no edema. Point tenderness between her third and fourth toe at area of MTP joint. Skin: Callus on plantar surface of left foot as well as on her distal large toe. Overgrown toenails. Neurologic: Alert and interactive. No gross deficits.  Assessment & Plan:   See Encounters Tab for problem based charting.  Patient seen with Dr. Evette Doffing

## 2016-11-02 NOTE — Assessment & Plan Note (Signed)
She would like to see a dentist for two broken teeth

## 2016-11-02 NOTE — Patient Instructions (Signed)
Follow up in 6-8 weeks with your primary care provider  Morton Neuralgia Introduction [place] Lathe neuralgia is a type of foot pain in the area closest to your toes. This area is sometimes called the ball of your foot. Morton neuralgia occurs when a branch of a nerve in your foot (digital nerve) becomes compressed. When this happens over a long period of time, the nerve can thicken (neuroma) and cause pain. This usually occurs between the third and fourth toe. Morton neuralgia can come and go but may get worse over time. What are the causes? Your digital nerve can become compressed and stretched at a point where it passes under a thick band of tissue that connects your toes (intermetatarsal ligament). Morton neuralgia can be caused by mild repetitive damage in this area. This type of damage can result from:  Activities such as running or jumping.  Wearing shoes that are too tight. What increases the risk? You may be at risk for Morton neuralgia if you:  Are female.  Wear high heels.  Wear shoes that are narrow or tight.  Participate in activities that stretch your toes. These include:  Running.  Ballet.  Long-distance walking. What are the signs or symptoms? The first symptom of Morton neuralgia is pain that spreads from the ball of your foot to your toes. It may feel like you are walking on a marble. Pain usually gets worse with walking and goes away at night. Other symptoms may include numbness and cramping of your toes. How is this diagnosed? Your health care provider will do a physical exam. When doing the exam, your health care provider may:  Squeeze your foot just behind your toe.  Ask you to move your toes to check for pain. You may also have tests on your foot to confirm the diagnosis. These may include:  An X-ray.  An MRI. How is this treated? Treatment for Morton neuralgia may be as simple as changing the kind of shoes you wear. Other treatments may include:  Wearing a  supportive pad (orthosis) under the front of your foot. This lifts your toe bones and takes pressure off the nerve.  Getting injections of numbing medicine and anti-inflammatory medicine (steroid) in the nerve.  Having surgery to remove part of the thickened nerve. Follow these instructions at home:  Take medicine only as directed by your health care provider.  Wear soft-soled shoes with a wide toe area.  Stop activities that may be causing pain.  Elevate your foot when resting.  Massage your foot.  Apply ice to the injured area:  Put ice in a plastic bag.  Place a towel between your skin and the bag.  Leave the ice on for 20 minutes, 2-3 times a day.  Keep all follow-up visits as directed by your health care provider. This is important. Contact a health care provider if:  Home care instructions are not helping you get better.  Your symptoms change or get worse. This information is not intended to replace advice given to you by your health care provider. Make sure you discuss any questions you have with your health care provider. Document Released: 02/20/2001 Document Revised: 04/21/2016 Document Reviewed: 01/15/2014  2017 Elsevier

## 2016-11-04 ENCOUNTER — Telehealth: Payer: Self-pay | Admitting: Internal Medicine

## 2016-11-04 NOTE — Telephone Encounter (Signed)
PT WAS HERE 11/02/16 TO RENEW GCCN, MISSING PAPER WORK, CALLED GAVE HER LIST OF PAPERS TO BRING, LMTCB

## 2016-11-08 NOTE — Telephone Encounter (Signed)
Patient was contacted with Darcella Cheshire, PharmD candidate. I agree with the assessment and plan of care documented. PCP appointment scheduled for 11/02/16

## 2016-11-27 ENCOUNTER — Encounter: Payer: Self-pay | Admitting: Internal Medicine

## 2016-11-27 DIAGNOSIS — F1120 Opioid dependence, uncomplicated: Secondary | ICD-10-CM | POA: Insufficient documentation

## 2016-12-07 ENCOUNTER — Encounter: Payer: Self-pay | Admitting: Internal Medicine

## 2016-12-07 ENCOUNTER — Ambulatory Visit (INDEPENDENT_AMBULATORY_CARE_PROVIDER_SITE_OTHER): Payer: Self-pay | Admitting: Internal Medicine

## 2016-12-07 VITALS — BP 108/56 | HR 82 | Temp 98.3°F | Wt 279.0 lb

## 2016-12-07 DIAGNOSIS — A084 Viral intestinal infection, unspecified: Secondary | ICD-10-CM | POA: Insufficient documentation

## 2016-12-07 DIAGNOSIS — IMO0002 Reserved for concepts with insufficient information to code with codable children: Secondary | ICD-10-CM

## 2016-12-07 DIAGNOSIS — E1165 Type 2 diabetes mellitus with hyperglycemia: Secondary | ICD-10-CM

## 2016-12-07 DIAGNOSIS — Z794 Long term (current) use of insulin: Secondary | ICD-10-CM

## 2016-12-07 DIAGNOSIS — E118 Type 2 diabetes mellitus with unspecified complications: Secondary | ICD-10-CM

## 2016-12-07 LAB — GLUCOSE, CAPILLARY: Glucose-Capillary: 101 mg/dL — ABNORMAL HIGH (ref 65–99)

## 2016-12-07 MED ORDER — GLUCOSE BLOOD VI STRP: ORAL_STRIP | 12 refills | Status: DC

## 2016-12-07 MED ORDER — AUTO-LANCET MISC: 12 refills | Status: DC

## 2016-12-07 MED ORDER — TRUE METRIX METER DEVI: 1.0000 | Freq: Two times a day (BID) | 0 refills | Status: DC

## 2016-12-07 MED ORDER — INSULIN PEN NEEDLE 29G X 5MM MISC: 11 refills | Status: DC

## 2016-12-07 NOTE — Assessment & Plan Note (Signed)
Patient does not have a glucometer at home is able to check her blood glucose readings recently. She is continuing to take all of her oral hypoglycemics as well as insulin. Given her poor by mouth intake in the setting of her viral gastroenteritis I'm concerned that she may have had hypoglycemia which is contributing to her general feeling of unwellness. Her point-of-care glucose today was within normal limits. New glucometer and testing supplies were ordered for her. She was given directions on insulin and menstruation and titration of her insulin for low and high blood glucose.  Plan: Glucometer and supplies were ordered today. Follow-up with her PCP in 2 months.

## 2016-12-07 NOTE — Patient Instructions (Signed)
Continue to drink plenty of fluids and make sure to check you blood sugar. I think you should be feeling better in 3-4 days. Let us know if you blood sugar gets higher than 300 or lower than 70.

## 2016-12-07 NOTE — Assessment & Plan Note (Signed)
Patient presents with a history of diarrhea, abdominal cramps, nausea, and general feeling of unwell since Monday. She reports that since Monday her symptoms of diarrhea and abdominal cramps have been resolving. She denies any blood in the stool, fat, or mucus. She also notes that her appetite has been significantly poor and she has been eating primarily soup and drinking plenty of fluids such as water.  Patient denies symptoms of chest pain, shortness of breath, upper respiratory infectious symptoms such as congestion, myalgias. Patient also notes she has not had any vomiting.  Patient became concerned because her daughter and grandson had similar viral illnesses which resolved in 24 hours but hers has lasted longer.  Patient has been continuing to take her hypoglycemic medications including her insulins. However she does not have a glucometer at home and has been unable to check her blood glucose readings recently. I am somewhat concerned that hypoglycemia may be contributing to her continued feeling of unwell and her poor by mouth intake and continuation of her hypoglycemics without titration.  Plan: Check blood glucose today. Within normal limits. Prescribed new glucometer and diabetes supplies. Patient was instructed to check her blood glucose 3 times today and to skip her insulin if her blood glucose is significantly low. She was instructed to call the clinic if her blood glucose falls below 70 or rises above 300 consistently for instructions on titration of her insulin. She was also instructed not to take her insulin if she is not going to be eating a meal. Patient was also instructed to continue to hydrate well in the setting of increased risk for dehydration with diarrhea and insensible losses and viral illness. Anticipatory guidance given, if patient's symptoms do not resolve by Monday she should return for further evaluation.

## 2016-12-07 NOTE — Progress Notes (Signed)
CC: diarrhea HPI: [name] is a 56 y.o. female with a h/o of t2DM, HTN, GERD who presents w/ concern of diarrhea and feeling weak.  Please see Problem-based charting for HPI and the status of patient's chronic medical conditions.  Past Medical History:  Diagnosis Date  . Anemia   . Asthma 11/01/2010  . Diabetes mellitus    since 1983  . GERD (gastroesophageal reflux disease)   . Hypertension   . Osteomyelitis ([place])   . Pneumonia    as a child    Social Hx: Social History  Substance Use Topics  . Smoking status: Never Smoker  . Smokeless tobacco: Never Used  . Alcohol use No   Review of Systems: ROS in HPI. Otherwise: Review of Systems  Constitutional: Positive for chills, diaphoresis, fever and malaise/fatigue. Negative for weight loss.  HENT: Negative.   Eyes: Negative.   Respiratory: Negative for cough and shortness of breath.   Cardiovascular: Negative for chest pain and leg swelling.  Gastrointestinal: Positive for abdominal pain, diarrhea and nausea. Negative for blood in stool, constipation, heartburn, melena and vomiting.  Genitourinary: Negative for dysuria, frequency and urgency.  Musculoskeletal: Negative for myalgias.  Neurological: Positive for dizziness and weakness. Negative for focal weakness and headaches.    Physical Exam: Vitals:   12/07/16 1503  BP: (!) 108/56  Pulse: 82  Temp: 98.3 F (36.8 C)  TempSrc: Oral  SpO2: 100%  Weight: 279 lb (126.6 kg)   Physical Exam  Constitutional: She appears well-developed. She is cooperative. No distress.  Cardiovascular: Normal rate, regular rhythm, normal heart sounds and normal pulses.  Exam reveals no gallop.   No murmur heard. Pulmonary/Chest: Effort normal and breath sounds normal. No respiratory distress. She has no wheezes. She has no rhonchi. She has no rales. Breasts are symmetrical.  Abdominal: Soft. Bowel sounds are normal. There is no hepatosplenomegaly. There is generalized  tenderness (mild). There is no rigidity, no rebound, no guarding and no CVA tenderness.  Musculoskeletal: She exhibits no edema.    Assessment & Plan:  See encounters tab for problem based medical decision making. Patient discussed with Dr. Dareen Piano  Viral gastroenteritis Patient presents with a history of diarrhea, abdominal cramps, nausea, and general feeling of unwell since Monday. She reports that since Monday her symptoms of diarrhea and abdominal cramps have been resolving. She denies any blood in the stool, fat, or mucus. She also notes that her appetite has been significantly poor and she has been eating primarily soup and drinking plenty of fluids such as water.  Patient denies symptoms of chest pain, shortness of breath, upper respiratory infectious symptoms such as congestion, myalgias. Patient also notes she has not had any vomiting.  Patient became concerned because her daughter and grandson had similar viral illnesses which resolved in 24 hours but hers has lasted longer.  Patient has been continuing to take her hypoglycemic medications including her insulins. However she does not have a glucometer at home and has been unable to check her blood glucose readings recently. I am somewhat concerned that hypoglycemia may be contributing to her continued feeling of unwell and her poor by mouth intake and continuation of her hypoglycemics without titration.  Plan: Check blood glucose today. Within normal limits. Prescribed new glucometer and diabetes supplies. Patient was instructed to check her blood glucose 3 times today and to skip her insulin if her blood glucose is significantly low. She was instructed to call the clinic if her blood glucose falls  below 70 or rises above 300 consistently for instructions on titration of her insulin. She was also instructed not to take her insulin if she is not going to be eating a meal. Patient was also instructed to continue to hydrate well in the  setting of increased risk for dehydration with diarrhea and insensible losses and viral illness. Anticipatory guidance given, if patient's symptoms do not resolve by Monday she should return for further evaluation.  Uncontrolled diabetes mellitus with complications Patient does not have a glucometer at home is able to check her blood glucose readings recently. She is continuing to take all of her oral hypoglycemics as well as insulin. Given her poor by mouth intake in the setting of her viral gastroenteritis I'm concerned that she may have had hypoglycemia which is contributing to her general feeling of unwellness. Her point-of-care glucose today was within normal limits. New glucometer and testing supplies were ordered for her. She was given directions on insulin and menstruation and titration of her insulin for low and high blood glucose.  Plan: Glucometer and supplies were ordered today. Follow-up with her PCP in 2 months.   Signed: Holley Raring, MD 12/07/2016, 4:48 PM  Pager: [phone]

## 2016-12-08 NOTE — Progress Notes (Signed)
Internal Medicine Clinic Attending  Case discussed with Dr. Strelow at the time of the visit.  We reviewed the resident's history and exam and pertinent patient test results.  I agree with the assessment, diagnosis, and plan of care documented in the resident's note.  

## 2016-12-19 ENCOUNTER — Other Ambulatory Visit: Payer: Self-pay | Admitting: Internal Medicine

## 2016-12-19 DIAGNOSIS — K219 Gastro-esophageal reflux disease without esophagitis: Secondary | ICD-10-CM

## 2016-12-19 NOTE — Telephone Encounter (Signed)
As this patient was seen by me and deemed necessary for their treatment, I will refill this prescription for omeprazole. I will dose down to 20 mg and reassess at f/u.

## 2017-01-26 ENCOUNTER — Telehealth: Payer: Self-pay | Admitting: Internal Medicine

## 2017-01-26 NOTE — Telephone Encounter (Signed)
CALLED PT NO ANSWER, NO VOICEMAIL

## 2017-01-31 ENCOUNTER — Encounter: Payer: Self-pay | Admitting: Internal Medicine

## 2017-01-31 DIAGNOSIS — Z79891 Long term (current) use of opiate analgesic: Secondary | ICD-10-CM | POA: Insufficient documentation

## 2017-02-27 ENCOUNTER — Other Ambulatory Visit: Payer: Self-pay | Admitting: Internal Medicine

## 2017-02-27 DIAGNOSIS — K219 Gastro-esophageal reflux disease without esophagitis: Secondary | ICD-10-CM

## 2017-02-28 NOTE — Telephone Encounter (Signed)
As this patient was seen by me and deemed necessary for their treatment, I will refill this prescription for omeprazole.

## 2017-03-13 ENCOUNTER — Other Ambulatory Visit: Payer: Self-pay | Admitting: Pharmacist

## 2017-03-13 DIAGNOSIS — IMO0002 Reserved for concepts with insufficient information to code with codable children: Secondary | ICD-10-CM

## 2017-03-13 DIAGNOSIS — E118 Type 2 diabetes mellitus with unspecified complications: Principal | ICD-10-CM

## 2017-03-13 DIAGNOSIS — E1165 Type 2 diabetes mellitus with hyperglycemia: Secondary | ICD-10-CM

## 2017-03-13 DIAGNOSIS — Z794 Long term (current) use of insulin: Principal | ICD-10-CM

## 2017-03-13 MED ORDER — SITAGLIPTIN PHOSPHATE 100 MG PO TABS: 100.0000 mg | ORAL_TABLET | Freq: Every day | ORAL | 1 refills | Status: DC

## 2017-03-13 NOTE — Progress Notes (Unsigned)
Discussed with PCP and agreed to switch patient from canagliflozin to sitagliptin.

## 2017-04-03 ENCOUNTER — Telehealth: Payer: Self-pay | Admitting: Internal Medicine

## 2017-04-03 NOTE — Telephone Encounter (Signed)
CALLED PATIENT ON BOTH NUMBER, LMTCB, SHE NEEDS TO RENEW GCCN CARD, NO ANSWER AND NO VOICEMAIL

## 2017-05-01 ENCOUNTER — Encounter: Payer: Self-pay | Admitting: *Deleted

## 2017-10-09 ENCOUNTER — Ambulatory Visit (INDEPENDENT_AMBULATORY_CARE_PROVIDER_SITE_OTHER): Payer: Self-pay | Admitting: Internal Medicine

## 2017-10-09 ENCOUNTER — Encounter: Payer: Self-pay | Admitting: Internal Medicine

## 2017-10-09 ENCOUNTER — Other Ambulatory Visit: Payer: Self-pay

## 2017-10-09 VITALS — BP 149/55 | HR 88 | Temp 98.1°F | Ht 73.0 in | Wt 290.7 lb

## 2017-10-09 DIAGNOSIS — I10 Essential (primary) hypertension: Secondary | ICD-10-CM

## 2017-10-09 DIAGNOSIS — E118 Type 2 diabetes mellitus with unspecified complications: Principal | ICD-10-CM

## 2017-10-09 DIAGNOSIS — E1149 Type 2 diabetes mellitus with other diabetic neurological complication: Secondary | ICD-10-CM

## 2017-10-09 DIAGNOSIS — Z794 Long term (current) use of insulin: Secondary | ICD-10-CM

## 2017-10-09 DIAGNOSIS — E1159 Type 2 diabetes mellitus with other circulatory complications: Secondary | ICD-10-CM

## 2017-10-09 DIAGNOSIS — I152 Hypertension secondary to endocrine disorders: Secondary | ICD-10-CM

## 2017-10-09 DIAGNOSIS — E1165 Type 2 diabetes mellitus with hyperglycemia: Secondary | ICD-10-CM

## 2017-10-09 DIAGNOSIS — E11649 Type 2 diabetes mellitus with hypoglycemia without coma: Secondary | ICD-10-CM

## 2017-10-09 DIAGNOSIS — K219 Gastro-esophageal reflux disease without esophagitis: Secondary | ICD-10-CM

## 2017-10-09 DIAGNOSIS — IMO0002 Reserved for concepts with insufficient information to code with codable children: Secondary | ICD-10-CM

## 2017-10-09 LAB — POCT GLYCOSYLATED HEMOGLOBIN (HGB A1C): HEMOGLOBIN A1C: 8.5

## 2017-10-09 LAB — GLUCOSE, CAPILLARY
GLUCOSE-CAPILLARY: 49 mg/dL — AB (ref 65–99)
GLUCOSE-CAPILLARY: 94 mg/dL (ref 65–99)

## 2017-10-09 MED ORDER — LISINOPRIL 10 MG PO TABS: 10.0000 mg | ORAL_TABLET | Freq: Every day | ORAL | 3 refills | Status: DC

## 2017-10-09 MED ORDER — HYDROCHLOROTHIAZIDE 25 MG PO TABS: 12.5000 mg | ORAL_TABLET | Freq: Every day | ORAL | 3 refills | Status: DC

## 2017-10-09 MED ORDER — INSULIN ISOPHANE & REGULAR (HUMAN 70-30)100 UNIT/ML KWIKPEN: 50.0000 [IU] | PEN_INJECTOR | Freq: Two times a day (BID) | SUBCUTANEOUS | 1 refills | Status: DC

## 2017-10-09 MED ORDER — GLUCOSE BLOOD VI STRP: ORAL_STRIP | 12 refills | Status: DC

## 2017-10-09 MED ORDER — SITAGLIPTIN PHOSPHATE 100 MG PO TABS: 100.0000 mg | ORAL_TABLET | Freq: Every day | ORAL | 0 refills | Status: DC

## 2017-10-09 MED ORDER — AUTO-LANCET MISC: 12 refills | Status: DC

## 2017-10-09 MED ORDER — BLOOD GLUCOSE METER KIT: PACK | 0 refills | Status: DC

## 2017-10-09 MED ORDER — ATORVASTATIN CALCIUM 40 MG PO TABS: 40.0000 mg | ORAL_TABLET | Freq: Every day | ORAL | 3 refills | Status: DC

## 2017-10-09 MED ORDER — INSULIN PEN NEEDLE 29G X 5MM MISC: 11 refills | Status: DC

## 2017-10-09 MED ORDER — INSULIN ISOPHANE & REGULAR (HUMAN 70-30)100 UNIT/ML KWIKPEN: 55.0000 [IU] | PEN_INJECTOR | Freq: Two times a day (BID) | SUBCUTANEOUS | 1 refills | Status: DC

## 2017-10-09 MED ORDER — METFORMIN HCL 1000 MG PO TABS: ORAL_TABLET | ORAL | 0 refills | Status: DC

## 2017-10-09 MED ORDER — OMEPRAZOLE 20 MG PO CPDR: 20.0000 mg | DELAYED_RELEASE_CAPSULE | Freq: Every day | ORAL | 1 refills | Status: DC

## 2017-10-09 MED ORDER — GABAPENTIN 300 MG PO CAPS: 900.0000 mg | ORAL_CAPSULE | Freq: Three times a day (TID) | ORAL | 1 refills | Status: DC

## 2017-10-09 NOTE — Assessment & Plan Note (Signed)
Refilled gabapentin until next visit. Please assess continued need for 300mg  TID of this medication. Patient has been obtaining this from a relative since she ran short.

## 2017-10-09 NOTE — Assessment & Plan Note (Signed)
Symptomatic reflux generally most days without significant relief from tums. Refilled Prilosec at 20mg  daily

## 2017-10-09 NOTE — Assessment & Plan Note (Addendum)
Assessment: Hypertensive to 149/55 today. Intermittantly taking her BP meds  Plan: Please discuss results of BMP+8, urine micro-albumin/Cr ratio and HgBA1c at visit in December. Please address compliance with medications and appointments. Refilled patients HCTZ and lisinopril at her previously prescribed doses.

## 2017-10-09 NOTE — Patient Instructions (Signed)
I have refilled your medications until you can meet with Dr. Reesa Chew in December.  Please be sure to meet with your Primary doctor and to bring with you all of the medications your are currenlty taking as well as a months worth of glucose readings.  You can discuss the results of the labs from today at that meeting as well.  Thank you for your visit to the [facility].

## 2017-10-09 NOTE — Progress Notes (Signed)
Internal Medicine Clinic Attending  I saw and evaluated the patient.  I personally confirmed the key portions of the history and exam documented by Dr. Harbrecht and I reviewed pertinent patient test results.  The assessment, diagnosis, and plan were formulated together and I agree with the documentation in the resident's note.  

## 2017-10-09 NOTE — Progress Notes (Signed)
Hypoglycemic Event  CBG: 49  Treatment: 3 Glucose tablets and Apple Juice  Symptoms: felt effects  Follow-up CBG: Time: 12:08 CBG Result: 94  Possible Reasons for Event: Ate breakfast at 7:30 AM and had taken Insulin dose  Comments/MD notified: Yes    Dr.  Billie Lade, Trey Sailors

## 2017-10-09 NOTE — Assessment & Plan Note (Addendum)
Assessment: Addressed non-compliance of glucose monitoring.  Patient would like for [name] to consider bydureon at her next visit. Last recorded serum glucose level was >200. Has been as low as 90. Patient becomes symptomatic at around 45-60, which has not occurred recently.  Plan: Patient advised to record glucose levels TID until her next visit with Dr. Reesa Chew. Refilled test strips, needles, and lancets at previously directed Ordered HgbA1c to be available for Dr. Reesa Chew as well as the BMP and urine alb/cr ratio. Patient needs complete diabetes evaluation including eye exam, foot exam at next visit with PCP.   Refilled sitagliptin, metformin, insulin 70/30 KWIKPEN (one Month only) all at previous doses as patient stated she has been compliant with these until very recently when she began to run short. Please assess glucose levels if patient provides at her next visit with Dr. Reesa Chew.   Sitagliptin was discontinued after patients CBG resulted at 49mg /dL. She will need to be further assessed at her PCP's visit with a month of data prior to continuing this medication. Insulin was decreased from 55 to 50 units BID with her two largest meals as well. She was treated with PO treatments to raise her levels prior to release. Rechecked and recorded to be 94mg /dL. Instructed to monitor her serum glucose levels and record them closely.

## 2017-10-09 NOTE — Progress Notes (Signed)
   CC: diabetes complications  HPI:  [name] is a 56 y.o. presents today for evaluation of her diabetes, hypertension and medication refills.  She denies acute complaints today.  Patient has appointment scheduled for December 14 with Dr. remained to be established with a PCP.  She would like to have some of her medications refilled until she can make this appointment she is unsure at this time. Denied nausea, vomiting, diarrhea, constipation, visual changes, headache, chest pain, palpitations, abdominal pain, leg pain, muscle aches, cough, ear pain, dizziness, diaphoresis, night sweats, chills, fever, insomnia, or other concerning symptoms at this time. Please see individual problem based assessment and plan for additional information.  Past Medical History:  Diagnosis Date  . Anemia   . Asthma 11/01/2010  . Diabetes mellitus    since 1983  . GERD (gastroesophageal reflux disease)   . Hypertension   . Osteomyelitis ([place])   . Pneumonia    as a child   Review of Systems:  ROS except as per HPI.  Physical Exam:  Vitals:   10/09/17 1044  BP: (!) 149/55  Pulse: 88  Temp: 98.1 F (36.7 C)  TempSrc: Oral  Weight: 290 lb 11.2 oz (131.9 kg)   Physical Exam  Constitutional: She appears well-developed and well-nourished. No distress.  Cardiovascular: Normal rate and regular rhythm.  Pulmonary/Chest: Effort normal and breath sounds normal. No stridor. No respiratory distress.  Abdominal: Soft. Bowel sounds are normal.  Neurological: She is alert.  Skin: Capillary refill takes less than 2 seconds. She is not diaphoretic.  Vitals reviewed.  Assessment & Plan:   See Encounters Tab for problem based charting.  Patient seen with Dr. Angelia Mould

## 2017-10-10 LAB — BMP8+ANION GAP
Anion Gap: 16 mmol/L (ref 10.0–18.0)
BUN / CREAT RATIO: 20 (ref 9–23)
BUN: 12 mg/dL (ref 6–24)
CHLORIDE: 105 mmol/L (ref 96–106)
CO2: 22 mmol/L (ref 20–29)
Calcium: 9.2 mg/dL (ref 8.7–10.2)
Creatinine, Ser: 0.6 mg/dL (ref 0.57–1.00)
GFR calc non Af Amer: 103 mL/min/{1.73_m2} (ref >59)
GFR, EST AFRICAN AMERICAN: 119 mL/min/{1.73_m2} (ref >59)
GLUCOSE: 49 mg/dL — AB (ref 65–99)
POTASSIUM: 4.2 mmol/L (ref 3.5–5.2)
SODIUM: 143 mmol/L (ref 134–144)

## 2017-10-10 LAB — MICROALBUMIN / CREATININE URINE RATIO
CREATININE, UR: 94.2 mg/dL
MICROALB/CREAT RATIO: 8.5 mg/g{creat} (ref 0.0–30.0)
MICROALBUM., U, RANDOM: 8 ug/mL

## 2017-10-27 ENCOUNTER — Other Ambulatory Visit: Payer: Self-pay | Admitting: Internal Medicine

## 2017-10-27 NOTE — Telephone Encounter (Signed)
Patient requesting refills

## 2017-10-27 NOTE — Telephone Encounter (Signed)
Called, ph rang until it was disconnected

## 2017-10-27 NOTE — Telephone Encounter (Signed)
Pt rtc, confirmed that scripts were sent to [facility], she will call [facility]

## 2017-10-30 ENCOUNTER — Other Ambulatory Visit: Payer: Self-pay | Admitting: Pharmacist

## 2017-10-30 ENCOUNTER — Ambulatory Visit (INDEPENDENT_AMBULATORY_CARE_PROVIDER_SITE_OTHER): Payer: Self-pay | Admitting: Internal Medicine

## 2017-10-30 ENCOUNTER — Encounter: Payer: Self-pay | Admitting: Internal Medicine

## 2017-10-30 ENCOUNTER — Other Ambulatory Visit: Payer: Self-pay

## 2017-10-30 VITALS — BP 140/54 | HR 85 | Temp 98.0°F | Ht 73.0 in | Wt 290.4 lb

## 2017-10-30 DIAGNOSIS — E1165 Type 2 diabetes mellitus with hyperglycemia: Secondary | ICD-10-CM

## 2017-10-30 DIAGNOSIS — L97429 Non-pressure chronic ulcer of left heel and midfoot with unspecified severity: Principal | ICD-10-CM

## 2017-10-30 DIAGNOSIS — I10 Essential (primary) hypertension: Principal | ICD-10-CM

## 2017-10-30 DIAGNOSIS — K219 Gastro-esophageal reflux disease without esophagitis: Secondary | ICD-10-CM

## 2017-10-30 DIAGNOSIS — E1169 Type 2 diabetes mellitus with other specified complication: Secondary | ICD-10-CM

## 2017-10-30 DIAGNOSIS — L97529 Non-pressure chronic ulcer of other part of left foot with unspecified severity: Secondary | ICD-10-CM

## 2017-10-30 DIAGNOSIS — E118 Type 2 diabetes mellitus with unspecified complications: Secondary | ICD-10-CM

## 2017-10-30 DIAGNOSIS — E1149 Type 2 diabetes mellitus with other diabetic neurological complication: Secondary | ICD-10-CM

## 2017-10-30 DIAGNOSIS — E785 Hyperlipidemia, unspecified: Secondary | ICD-10-CM

## 2017-10-30 DIAGNOSIS — Z89422 Acquired absence of other left toe(s): Secondary | ICD-10-CM

## 2017-10-30 DIAGNOSIS — E1159 Type 2 diabetes mellitus with other circulatory complications: Secondary | ICD-10-CM

## 2017-10-30 DIAGNOSIS — I152 Hypertension secondary to endocrine disorders: Secondary | ICD-10-CM

## 2017-10-30 DIAGNOSIS — Z7984 Long term (current) use of oral hypoglycemic drugs: Secondary | ICD-10-CM

## 2017-10-30 DIAGNOSIS — E11621 Type 2 diabetes mellitus with foot ulcer: Secondary | ICD-10-CM

## 2017-10-30 DIAGNOSIS — Z89412 Acquired absence of left great toe: Secondary | ICD-10-CM

## 2017-10-30 DIAGNOSIS — IMO0002 Reserved for concepts with insufficient information to code with codable children: Secondary | ICD-10-CM

## 2017-10-30 MED ORDER — TRAMADOL HCL 50 MG PO TABS: 50.0000 mg | ORAL_TABLET | Freq: Four times a day (QID) | ORAL | 0 refills | Status: DC | PRN

## 2017-10-30 MED ORDER — GABAPENTIN 300 MG PO CAPS: 900.0000 mg | ORAL_CAPSULE | Freq: Three times a day (TID) | ORAL | 0 refills | Status: DC

## 2017-10-30 MED ORDER — LISINOPRIL 10 MG PO TABS: 10.0000 mg | ORAL_TABLET | Freq: Every day | ORAL | 0 refills | Status: DC

## 2017-10-30 MED ORDER — HYDROCHLOROTHIAZIDE 12.5 MG PO TABS: 12.5000 mg | ORAL_TABLET | Freq: Every day | ORAL | 0 refills | Status: DC

## 2017-10-30 MED ORDER — METFORMIN HCL 1000 MG PO TABS: 1000.0000 mg | ORAL_TABLET | Freq: Two times a day (BID) | ORAL | 0 refills | Status: DC

## 2017-10-30 MED ORDER — ATORVASTATIN CALCIUM 40 MG PO TABS: 40.0000 mg | ORAL_TABLET | Freq: Every day | ORAL | 0 refills | Status: DC

## 2017-10-30 MED ORDER — CEPHALEXIN 500 MG PO CAPS: 500.0000 mg | ORAL_CAPSULE | Freq: Four times a day (QID) | ORAL | 0 refills | Status: DC

## 2017-10-30 MED ORDER — DOXYCYCLINE HYCLATE 50 MG PO CAPS: 100.0000 mg | ORAL_CAPSULE | Freq: Two times a day (BID) | ORAL | 0 refills | Status: AC

## 2017-10-30 MED FILL — HYDROCHLOROTHIAZIDE 12.5 MG: 12.5 | 30 days supply | Qty: 30 | Fill #0

## 2017-10-30 MED FILL — ATORVASTATIN 40 MG TABLET: 40 | 30 days supply | Qty: 30 | Fill #0

## 2017-10-30 MED FILL — DOXYCYCLINE HYC 50 MG CAP: 50 | 14 days supply | Qty: 28 | Fill #0

## 2017-10-30 MED FILL — metFORMIN HCL 1000 MG TABS: 1000 | 30 days supply | Qty: 60 | Fill #0

## 2017-10-30 MED FILL — LISINOPRIL 10 MG TABS: 10 | 30 days supply | Qty: 30 | Fill #0

## 2017-10-30 MED FILL — GABAPENTIN 300 MG CAPSULE: 300 | 30 days supply | Qty: 270 | Fill #0

## 2017-10-30 MED FILL — traMADol HCL 50 MG TABS: 50 | 5 days supply | Qty: 20 | Fill #0

## 2017-10-30 NOTE — Progress Notes (Addendum)
Patient is scheduled with an appointment with [facility] on 11/06/2017 at 9:30 AM with a 9:15 AM arrival.  Patient was given information and location of the appointment.  Sander Nephew, RN 10/30/2017 11:00 AM.  Call to [facility] to cancel patient's appointment for 11/06/2017.  Patient has an appointment to go to the [facility] on 11/07/2017.  Sander Nephew, RN  10/30/2017 2:26 PM.

## 2017-10-30 NOTE — Progress Notes (Signed)
   CC: L foot wound  HPI:  [name] is a 56 y.o. with PMH of DM, HTN, and GERD who is presenting for evaluation of wound on L foot which has been present for 3 weeks. The patient states that her wound started as a callus, which she has been shaving herself with a razor blade. Over the past week the wound has become somewhat painful to the touch and started draining a light yellow, clear fluid. She denies purulent drainage, erythema of the area, fevers, and chills.   She was last seen in clinic on 10/09/2017 for refill of diabetes medications. At that time, the patient experienced an episode of symptomatic hypoglycemia while in clinic that resolved after administration of orange juice. The patient reported that this episode was a result of taking insulin without eating her usual meal. Her last A1C on 10/09/17 was 8.5%, suggesting uncontrolled diabetes. Since her last visit the patient states that she has been out of insulin, but has been taking oral medications as previously prescribed. She states she has been waiting for her medications to come in the mail through [facility], but they have not come yet. Patient did not bring glucometer today, but most recent blood sugars reported >300.   Past Medical History: Past Medical History:  Diagnosis Date  . Anemia   . Asthma 11/01/2010  . Diabetes mellitus    since 1983  . GERD (gastroesophageal reflux disease)   . Hypertension   . Osteomyelitis ([place])   . Pneumonia    as a child   Review of Systems:   Patient endorses pain and discharge from wound on bottom of L foot, as per HPI Patient denies chest pain, shortness of breath, abdominal pain, diaphoresis, nausea/vomiting, lower extremity swelling, and change in bowel/bladder habits.  Physical Exam:  Vitals:   10/30/17 0939  BP: (!) 140/54  Pulse: 85  Temp: 98 F (36.7 C)  TempSrc: Oral  SpO2: 99%  Weight: 290 lb 6.4 oz (131.7 kg)  Height: 6\' 1"  (1.854 m)   Physical Exam    Constitutional: She appears well-developed and well-nourished.  Cardiovascular: Normal rate, regular rhythm and normal heart sounds.  1+ dorsalis pedis and posterior tibial pulses bilaterally  Respiratory: Effort normal and breath sounds normal.  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  Neurological:  Sensation to light touch of bilateral lower extremities intact.  Skin: Skin is warm and dry. No erythema.  Increased warmth of L foot relative to R       Assessment & Plan:   See Encounters Tab for problem based charting.  Patient seen with Dr. Dareen Piano.

## 2017-10-30 NOTE — Progress Notes (Signed)
Patient unable to afford medications. Referred to [facility] x 1-time fill, then [facility] to re-enroll in free program.

## 2017-10-30 NOTE — Assessment & Plan Note (Addendum)
Patient presents with non-healing callus x 3 weeks. The patient has been self debriding callus with razor at home. Patient's wound has recently started draining serosanguinous discharge that is not purulent or foul smelling. There is minimal pain on exam and patient states this does not feel like previous episodes of osteomyelitis. Physical exam showed mild swelling and increased calor of L foot relative to R, suggesting possible overlying cellulitis. Low concern for osteomyelitis currently, but will evaluate with imaging to help rule out this possibility given history of osteomyelitis requiring amputation. Patient will also be referred to podiatry for proper wound care, dressing recommendations, and footwear recommendations.   Plan: -Doxycycline 100 mg BID x 7 days (patient refused Keflex b/c this antibiotic "doesn't work for her") -Complete L foot X ray to evaluate for osteomyelitis -Tramadol 50 mg for q6 hours PRN for pain (20 tablets given) -Follow up with [facility] for wound care, appointment scheduled prior to patient leaving clinic with assistance of clinic staff -RTC in 2 weeks for wound check or sooner if wound develops purulent discharge and/or worsening pain while on antibiotic therapy -Follow up with Dr. Maudie Mercury regarding [facility] paperwork and cost-effective insulin regimen

## 2017-10-30 NOTE — Progress Notes (Signed)
Internal Medicine Clinic Attending  I saw and evaluated the patient.  I personally confirmed the key portions of the history and exam documented by Dr. Nedrud and I reviewed pertinent patient test results.  The assessment, diagnosis, and plan were formulated together and I agree with the documentation in the resident's note.  

## 2017-10-30 NOTE — Patient Instructions (Signed)
FOLLOW-UP INSTRUCTIONS When: Two weeks For: Wound-recheck What to bring: Glucometer to check glucose readings  Thank you for seeing [name] in the clinic today!  You were evaluated for left foot wound.   As stated above, please return to the clinic in 2 weeks for follow up of your left foot wound. Please complete a 1 week course of antibiotics. If you develop worsening symptoms, including increased foul-smelling discharge or pain in your foot, while taking antibiotics please return to clinic sooner. Please follow up with podiatry regarding care of your left foot wound.   If you have any further questions or concerns, please call our clinic at [phone] between 9am-5pm and after hours call [phone] and ask for the internal medicine resident on call. If you feel you are having a medical emergency please call 911.   Dr. Thomasene Ripple

## 2017-11-07 ENCOUNTER — Ambulatory Visit: Payer: Self-pay | Admitting: Internal Medicine

## 2017-11-10 ENCOUNTER — Encounter: Payer: Self-pay | Admitting: Internal Medicine

## 2017-11-13 ENCOUNTER — Ambulatory Visit: Payer: Self-pay | Admitting: Surgery

## 2017-11-16 ENCOUNTER — Telehealth: Payer: Self-pay | Admitting: Pharmacist

## 2017-11-16 NOTE — Progress Notes (Signed)
Tried referring patient to [facility] pharmacy 10/30/17. Patient has not followed through completely, they are awaiting copies of her pay stubs. Unable to reach patient

## 2017-12-11 ENCOUNTER — Ambulatory Visit: Payer: Self-pay | Admitting: Physician Assistant

## 2018-01-01 ENCOUNTER — Ambulatory Visit: Payer: Self-pay | Admitting: Physician Assistant

## 2018-01-04 ENCOUNTER — Ambulatory Visit: Payer: Self-pay | Admitting: Nurse Practitioner

## 2018-01-16 ENCOUNTER — Encounter (HOSPITAL_BASED_OUTPATIENT_CLINIC_OR_DEPARTMENT_OTHER): Payer: Self-pay

## 2018-02-01 ENCOUNTER — Ambulatory Visit: Payer: Self-pay

## 2018-02-01 ENCOUNTER — Other Ambulatory Visit (HOSPITAL_COMMUNITY)
Admission: RE | Admit: 2018-02-01 | Discharge: 2018-02-01 | Disposition: A | Discharge status: Home / Self Care | Payer: Self-pay | Source: Other Acute Inpatient Hospital | Attending: Internal Medicine | Admitting: Internal Medicine

## 2018-02-01 ENCOUNTER — Encounter (HOSPITAL_BASED_OUTPATIENT_CLINIC_OR_DEPARTMENT_OTHER): Payer: Self-pay | Attending: Internal Medicine

## 2018-02-01 DIAGNOSIS — E11621 Type 2 diabetes mellitus with foot ulcer: Secondary | ICD-10-CM | POA: Insufficient documentation

## 2018-02-01 DIAGNOSIS — I10 Essential (primary) hypertension: Secondary | ICD-10-CM | POA: Insufficient documentation

## 2018-02-01 DIAGNOSIS — Z794 Long term (current) use of insulin: Secondary | ICD-10-CM | POA: Insufficient documentation

## 2018-02-01 DIAGNOSIS — L97522 Non-pressure chronic ulcer of other part of left foot with fat layer exposed: Secondary | ICD-10-CM | POA: Insufficient documentation

## 2018-02-01 DIAGNOSIS — E1142 Type 2 diabetes mellitus with diabetic polyneuropathy: Secondary | ICD-10-CM | POA: Insufficient documentation

## 2018-02-01 DIAGNOSIS — Z89412 Acquired absence of left great toe: Secondary | ICD-10-CM | POA: Insufficient documentation

## 2018-02-05 ENCOUNTER — Ambulatory Visit: Payer: Self-pay

## 2018-02-06 LAB — AEROBIC/ANAEROBIC CULTURE (SURGICAL/DEEP WOUND)

## 2018-02-06 LAB — AEROBIC/ANAEROBIC CULTURE W GRAM STAIN (SURGICAL/DEEP WOUND)

## 2018-02-09 ENCOUNTER — Other Ambulatory Visit: Payer: Self-pay

## 2018-02-09 ENCOUNTER — Ambulatory Visit (HOSPITAL_COMMUNITY)
Admission: RE | Admit: 2018-02-09 | Discharge: 2018-02-09 | Disposition: A | Discharge status: Home / Self Care | Payer: Self-pay | Source: Ambulatory Visit | Attending: Internal Medicine | Admitting: Internal Medicine

## 2018-02-09 ENCOUNTER — Other Ambulatory Visit: Payer: Self-pay | Admitting: Internal Medicine

## 2018-02-09 ENCOUNTER — Ambulatory Visit (INDEPENDENT_AMBULATORY_CARE_PROVIDER_SITE_OTHER): Payer: Self-pay | Admitting: Internal Medicine

## 2018-02-09 VITALS — BP 118/68 | HR 89 | Temp 98.0°F | Ht 73.0 in | Wt 294.0 lb

## 2018-02-09 DIAGNOSIS — E118 Type 2 diabetes mellitus with unspecified complications: Principal | ICD-10-CM

## 2018-02-09 DIAGNOSIS — E1169 Type 2 diabetes mellitus with other specified complication: Secondary | ICD-10-CM

## 2018-02-09 DIAGNOSIS — Z79899 Other long term (current) drug therapy: Secondary | ICD-10-CM

## 2018-02-09 DIAGNOSIS — E11621 Type 2 diabetes mellitus with foot ulcer: Secondary | ICD-10-CM | POA: Insufficient documentation

## 2018-02-09 DIAGNOSIS — E785 Hyperlipidemia, unspecified: Secondary | ICD-10-CM

## 2018-02-09 DIAGNOSIS — I10 Essential (primary) hypertension: Secondary | ICD-10-CM

## 2018-02-09 DIAGNOSIS — E1140 Type 2 diabetes mellitus with diabetic neuropathy, unspecified: Secondary | ICD-10-CM

## 2018-02-09 DIAGNOSIS — E1165 Type 2 diabetes mellitus with hyperglycemia: Secondary | ICD-10-CM

## 2018-02-09 DIAGNOSIS — Z794 Long term (current) use of insulin: Secondary | ICD-10-CM

## 2018-02-09 DIAGNOSIS — K219 Gastro-esophageal reflux disease without esophagitis: Secondary | ICD-10-CM

## 2018-02-09 DIAGNOSIS — M86172 Other acute osteomyelitis, left ankle and foot: Secondary | ICD-10-CM

## 2018-02-09 DIAGNOSIS — Z89432 Acquired absence of left foot: Secondary | ICD-10-CM | POA: Insufficient documentation

## 2018-02-09 DIAGNOSIS — IMO0002 Reserved for concepts with insufficient information to code with codable children: Secondary | ICD-10-CM

## 2018-02-09 DIAGNOSIS — E1159 Type 2 diabetes mellitus with other circulatory complications: Secondary | ICD-10-CM

## 2018-02-09 DIAGNOSIS — M7989 Other specified soft tissue disorders: Secondary | ICD-10-CM | POA: Insufficient documentation

## 2018-02-09 LAB — GLUCOSE, CAPILLARY: Glucose-Capillary: 210 mg/dL — ABNORMAL HIGH (ref 65–99)

## 2018-02-09 LAB — POCT GLYCOSYLATED HEMOGLOBIN (HGB A1C): Hemoglobin A1C: 7.9

## 2018-02-09 MED ORDER — INSULIN ISOPHANE & REGULAR (HUMAN 70-30)100 UNIT/ML KWIKPEN: 55.0000 [IU] | PEN_INJECTOR | Freq: Two times a day (BID) | SUBCUTANEOUS | 1 refills | Status: DC

## 2018-02-09 MED ORDER — INSULIN ASPART PROT & ASPART (70-30 MIX) 100 UNIT/ML PEN: 55.0000 [IU] | PEN_INJECTOR | Freq: Two times a day (BID) | SUBCUTANEOUS | 11 refills | Status: DC

## 2018-02-09 MED ORDER — GABAPENTIN 300 MG PO CAPS: 900.0000 mg | ORAL_CAPSULE | Freq: Three times a day (TID) | ORAL | 1 refills | Status: DC

## 2018-02-09 MED ORDER — METFORMIN HCL 1000 MG PO TABS: ORAL_TABLET | ORAL | 1 refills | Status: DC

## 2018-02-09 MED ORDER — ATORVASTATIN CALCIUM 40 MG PO TABS: 40.0000 mg | ORAL_TABLET | Freq: Every day | ORAL | 1 refills | Status: DC

## 2018-02-09 MED ORDER — LISINOPRIL 10 MG PO TABS: 10.0000 mg | ORAL_TABLET | Freq: Every day | ORAL | 1 refills | Status: DC

## 2018-02-09 MED ORDER — EXENATIDE 5 MCG/0.02ML ~~LOC~~ SOPN: 5.0000 ug | PEN_INJECTOR | Freq: Two times a day (BID) | SUBCUTANEOUS | 3 refills | Status: DC

## 2018-02-09 MED ORDER — HYDROCHLOROTHIAZIDE 25 MG PO TABS: 12.5000 mg | ORAL_TABLET | Freq: Every day | ORAL | 1 refills | Status: DC

## 2018-02-09 MED ORDER — OMEPRAZOLE 20 MG PO CPDR: 20.0000 mg | DELAYED_RELEASE_CAPSULE | Freq: Every day | ORAL | 1 refills | Status: DC

## 2018-02-09 MED FILL — UNIFINE PENTIPS 31GX3/16: 31G X 5 MM | 25 days supply | Qty: 100 | Fill #0

## 2018-02-09 MED FILL — UNIFINE PENTIPS 31GX3/16": 31G X 5 MM | 25 days supply | Qty: 100 | Fill #0

## 2018-02-09 MED FILL — OMEPRAZOLE 20 MG CAP: 20 | 30 days supply | Qty: 30 | Fill #0

## 2018-02-09 MED FILL — GABAPENTIN 300 MG CAPSULE: 300 | 30 days supply | Qty: 270 | Fill #0

## 2018-02-09 MED FILL — BYETTA 5 MCG DOSE PEN INJ: 5 | 28 days supply | Qty: 1 | Fill #0

## 2018-02-09 MED FILL — ATORVASTATIN 40 MG TABLET: 40 | 30 days supply | Qty: 30 | Fill #0

## 2018-02-09 MED FILL — HYDROCHLOROTHIAZIDE 25 MG T: 25 | 30 days supply | Qty: 15 | Fill #0

## 2018-02-09 MED FILL — NOVOLOG MIX 70-30 FLEXPEN S: (70-30) 100 | 14 days supply | Qty: 15 | Fill #0

## 2018-02-09 MED FILL — metFORMIN HCL 1000 MG TABS: 1000 | 30 days supply | Qty: 60 | Fill #0

## 2018-02-09 MED FILL — LISINOPRIL 10 MG TABS: 10 | 30 days supply | Qty: 30 | Fill #0

## 2018-02-09 NOTE — Assessment & Plan Note (Signed)
BP Readings from Last 3 Encounters:  02/09/18 118/68  10/30/17 (!) 140/54  10/09/17 (!) 149/55   BP is well controlled today on 2 class regimen of lisinopril and HCTZ.  Plan: - continue current regimen - refills provided

## 2018-02-09 NOTE — Assessment & Plan Note (Signed)
She takes Prilosec for her GERD.  Plan: - Refill provided.

## 2018-02-09 NOTE — Progress Notes (Signed)
   CC: f/u DM, HTN, GERD, HLD  HPI:  [name] is a 57 y.o. woman with a past medical history listed below here today for follow up of her DM, HTN, GERD, HLD.  For details of today's visit and the status of her chronic medical issues please refer to the assessment and plan.   Past Medical History:  Diagnosis Date  . Anemia   . Asthma 11/01/2010  . Diabetes mellitus    since 1983  . GERD (gastroesophageal reflux disease)   . Hypertension   . Osteomyelitis ([place])   . Pneumonia    as a child   Review of Systems:  Review of Systems  Constitutional: Negative for chills and fever.  Respiratory: Negative for cough and shortness of breath.   Cardiovascular: Negative for chest pain.  Gastrointestinal: Negative for abdominal pain and heartburn.     Physical Exam:  Vitals:   02/09/18 1440  BP: 118/68  Pulse: 89  Temp: 98 F (36.7 C)  TempSrc: Oral  SpO2: 100%  Weight: 294 lb (133.4 kg)  Height: 6\' 1"  (1.854 m)   Physical Exam  Constitutional: She is oriented to person, place, and time and well-developed, well-nourished, and in no distress.  HENT:  Head: Normocephalic and atraumatic.  Cardiovascular: Normal rate and regular rhythm.  Pulmonary/Chest: Effort normal and breath sounds normal. She has no wheezes. She has no rales.  Musculoskeletal:  She is wearing a protective boot on her left foot.   Neurological: She is alert and oriented to person, place, and time.  Skin: Skin is warm and dry.  Psychiatric: Mood and affect normal.    Assessment & Plan:   See Encounters Tab for problem based charting.  Patient discussed with Dr. Dareen Piano.  Hypertension associated with diabetes ([place]) BP Readings from Last 3 Encounters:  02/09/18 118/68  10/30/17 (!) 140/54  10/09/17 (!) 149/55   BP is well controlled today on 2 class regimen of lisinopril and HCTZ.  Plan: - continue current regimen - refills provided  GERD (gastroesophageal reflux disease) She takes  Prilosec for her GERD.  Plan: - Refill provided.   Uncontrolled diabetes mellitus with complications ([place]) Lab Results  Component Value Date   HGBA1C 7.9 02/09/2018   A1c improved today from 8.4 to 7.9.  Currently on 70/30 insulin 55 units BID and metformin 1000mg  BID.  Did not bring her meter with her today.  She is interested in Victoza b/c a relative of hers had good results with this.  Denies any hypoglycemic episodes.    Plan: - Continue insulin 70/30 at 55 units BID - Continue metformin 1000mg  BID - Start Byetta 43mcg BID as this was the affordable option than Victoza  Neuropathy in diabetes ([place]) Gabpentin refilled today.   Hyperlipidemia associated with type 2 diabetes mellitus (HCC) Atorvastatin refilled today.

## 2018-02-09 NOTE — Assessment & Plan Note (Signed)
Lab Results  Component Value Date   HGBA1C 7.9 02/09/2018   A1c improved today from 8.4 to 7.9.  Currently on 70/30 insulin 55 units BID and metformin 1000mg  BID.  Did not bring her meter with her today.  She is interested in Victoza b/c a relative of hers had good results with this.  Denies any hypoglycemic episodes.    Plan: - Continue insulin 70/30 at 55 units BID - Continue metformin 1000mg  BID - Start Byetta 49mcg BID as this was the affordable option than Victoza

## 2018-02-09 NOTE — Assessment & Plan Note (Signed)
Atorvastatin refilled today

## 2018-02-09 NOTE — Assessment & Plan Note (Signed)
Gabpentin refilled today.

## 2018-02-09 NOTE — Patient Instructions (Signed)
FOLLOW-UP INSTRUCTIONS When: next available with Dr Maricela Bo For: Diabetes f/u What to bring: medications  I have refilled all your medications  I have started Byetta 5cmg injections twice per day to further lower your A1c

## 2018-02-12 NOTE — Progress Notes (Signed)
Internal Medicine Clinic Attending  Case discussed with Dr. Wallace at the time of the visit.  We reviewed the resident's history and exam and pertinent patient test results.  I agree with the assessment, diagnosis, and plan of care documented in the resident's note.  

## 2018-02-23 MED FILL — NOVOLOG MIX 70-30 FLEXPEN S: (70-30) 100 | 14 days supply | Qty: 15 | Fill #1

## 2018-02-26 ENCOUNTER — Encounter (HOSPITAL_BASED_OUTPATIENT_CLINIC_OR_DEPARTMENT_OTHER): Payer: Self-pay | Attending: Internal Medicine

## 2018-02-26 DIAGNOSIS — E1142 Type 2 diabetes mellitus with diabetic polyneuropathy: Secondary | ICD-10-CM | POA: Insufficient documentation

## 2018-02-26 DIAGNOSIS — I10 Essential (primary) hypertension: Secondary | ICD-10-CM | POA: Insufficient documentation

## 2018-02-26 DIAGNOSIS — E11621 Type 2 diabetes mellitus with foot ulcer: Secondary | ICD-10-CM | POA: Insufficient documentation

## 2018-02-26 DIAGNOSIS — L97522 Non-pressure chronic ulcer of other part of left foot with fat layer exposed: Secondary | ICD-10-CM | POA: Insufficient documentation

## 2018-02-27 ENCOUNTER — Ambulatory Visit: Payer: Self-pay

## 2018-02-27 ENCOUNTER — Other Ambulatory Visit (HOSPITAL_BASED_OUTPATIENT_CLINIC_OR_DEPARTMENT_OTHER): Payer: Self-pay | Admitting: Internal Medicine

## 2018-02-27 DIAGNOSIS — M869 Osteomyelitis, unspecified: Principal | ICD-10-CM

## 2018-02-27 DIAGNOSIS — L97509 Non-pressure chronic ulcer of other part of unspecified foot with unspecified severity: Principal | ICD-10-CM

## 2018-02-27 DIAGNOSIS — E1169 Type 2 diabetes mellitus with other specified complication: Principal | ICD-10-CM

## 2018-02-27 DIAGNOSIS — E11621 Type 2 diabetes mellitus with foot ulcer: Secondary | ICD-10-CM

## 2018-03-05 ENCOUNTER — Ambulatory Visit (HOSPITAL_COMMUNITY): Admission: RE | Admit: 2018-03-05 | Payer: Self-pay | Source: Ambulatory Visit

## 2018-03-09 MED FILL — NOVOLOG MIX 70-30 FLEXPEN S: (70-30) 100 | 14 days supply | Qty: 15 | Fill #2

## 2018-03-12 ENCOUNTER — Ambulatory Visit (HOSPITAL_COMMUNITY): Admission: RE | Admit: 2018-03-12 | Payer: Self-pay | Source: Ambulatory Visit

## 2018-04-09 MED FILL — NOVOLOG MIX 70-30 FLEXPEN S: (70-30) 100 | 14 days supply | Qty: 15 | Fill #3

## 2018-04-09 MED FILL — LISINOPRIL 10 MG TABLET: 10 | 30 days supply | Qty: 30 | Fill #1

## 2018-04-09 MED FILL — OMEPRAZOLE 20 MG CAP: 20 | 30 days supply | Qty: 30 | Fill #1

## 2018-04-16 NOTE — Progress Notes (Deleted)
   CC: ***  HPI:  [name] is a 57 y.o. f with diabetes mellitus, hypertension, osteomyelitis, and asthma who presented for ***.   Past Medical History:  Diagnosis Date  . Anemia   . Asthma 11/01/2010  . Diabetes mellitus    since 1983  . GERD (gastroesophageal reflux disease)   . Hypertension   . Osteomyelitis ([place])   . Pneumonia    as a child   Review of Systems:  ***  Physical Exam:  There were no vitals filed for this visit. ***  Assessment & Plan:   See Encounters Tab for problem based charting.  Diabetes Mellitus The patient is taking metformin 1000mg  bid and novolog 70/30 55u bid . The patient's last a1c=7.9 on 02/09/18. The patient's home blood glucose measurements over the past month have ranged ***. The patient does/does not note episodes of hypoglycemia.  The patient is currently taking ***. He/yes*** or she is compliant with medication.    Patient's weight changes ***  Hypertension The patient's blood pressure during this visit was ***. She is currently taking HCTZ 12.5mg  qd, lisinopril 10mg  qhs.   Health Maintenance Colonoscopy Lipid panel Pap smear Mammogram     Patient {GC/GE:[id number]::"discussed with","seen with"} Dr. {NAMES:[id number]::"Butcher","Granfortuna","E. Hoffman","Klima","Mullen","Narendra","Raines","Vincent"}

## 2018-04-20 ENCOUNTER — Encounter: Payer: Self-pay | Admitting: Internal Medicine

## 2018-04-30 MED FILL — NOVOLOG MIX 70-30 FLEXPEN S: (70-30) 100 | 14 days supply | Qty: 15 | Fill #4

## 2018-05-11 ENCOUNTER — Other Ambulatory Visit: Payer: Self-pay | Admitting: Internal Medicine

## 2018-05-11 DIAGNOSIS — I10 Essential (primary) hypertension: Secondary | ICD-10-CM

## 2018-05-11 MED FILL — ATORVASTATIN 40 MG TABLET: 40 | 30 days supply | Qty: 30 | Fill #1

## 2018-05-11 MED FILL — HYDROCHLOROTHIAZIDE 25 MG T: 25 | 30 days supply | Qty: 15 | Fill #1

## 2018-05-11 MED FILL — BYETTA 5 MCG DOSE PEN INJ: 5 | 28 days supply | Qty: 1 | Fill #1

## 2018-05-11 MED FILL — NOVOLOG MIX 70-30 FLEXPEN S: (70-30) 100 | 14 days supply | Qty: 15 | Fill #5

## 2018-05-11 MED FILL — GABAPENTIN 300 MG CAPSULE: 300 | 30 days supply | Qty: 270 | Fill #1

## 2018-05-11 MED FILL — metFORMIN HCL 1000 MG TABS: 1000 | 30 days supply | Qty: 60 | Fill #1

## 2018-05-14 ENCOUNTER — Other Ambulatory Visit: Payer: Self-pay | Admitting: *Deleted

## 2018-05-14 DIAGNOSIS — I10 Essential (primary) hypertension: Secondary | ICD-10-CM

## 2018-05-14 MED ORDER — LISINOPRIL 10 MG PO TABS: 10.0000 mg | ORAL_TABLET | Freq: Every day | ORAL | 0 refills | Status: DC

## 2018-05-14 NOTE — Telephone Encounter (Signed)
Please resend lisinopril script, all im program scripts must have im program in body of script

## 2018-05-28 ENCOUNTER — Encounter (HOSPITAL_BASED_OUTPATIENT_CLINIC_OR_DEPARTMENT_OTHER): Payer: No Typology Code available for payment source | Attending: Internal Medicine

## 2018-05-28 DIAGNOSIS — Z794 Long term (current) use of insulin: Secondary | ICD-10-CM | POA: Insufficient documentation

## 2018-05-28 DIAGNOSIS — I10 Essential (primary) hypertension: Secondary | ICD-10-CM | POA: Insufficient documentation

## 2018-05-28 DIAGNOSIS — L97522 Non-pressure chronic ulcer of other part of left foot with fat layer exposed: Secondary | ICD-10-CM | POA: Insufficient documentation

## 2018-05-28 DIAGNOSIS — E11621 Type 2 diabetes mellitus with foot ulcer: Secondary | ICD-10-CM | POA: Insufficient documentation

## 2018-05-28 DIAGNOSIS — E1142 Type 2 diabetes mellitus with diabetic polyneuropathy: Secondary | ICD-10-CM | POA: Insufficient documentation

## 2018-05-28 MED FILL — LISINOPRIL 10 MG TABLET: 10 | 90 days supply | Qty: 90 | Fill #0

## 2018-05-28 MED FILL — NOVOLOG MIX 70-30 FLEXPEN S: (70-30) 100 | 14 days supply | Qty: 15 | Fill #6

## 2018-05-29 ENCOUNTER — Other Ambulatory Visit (HOSPITAL_BASED_OUTPATIENT_CLINIC_OR_DEPARTMENT_OTHER): Payer: Self-pay | Admitting: Internal Medicine

## 2018-05-29 DIAGNOSIS — E11621 Type 2 diabetes mellitus with foot ulcer: Secondary | ICD-10-CM

## 2018-05-29 DIAGNOSIS — M869 Osteomyelitis, unspecified: Principal | ICD-10-CM

## 2018-05-29 DIAGNOSIS — E1169 Type 2 diabetes mellitus with other specified complication: Principal | ICD-10-CM

## 2018-05-29 DIAGNOSIS — L97509 Non-pressure chronic ulcer of other part of unspecified foot with unspecified severity: Principal | ICD-10-CM

## 2018-06-05 ENCOUNTER — Ambulatory Visit (HOSPITAL_COMMUNITY): Admission: RE | Admit: 2018-06-05 | Payer: Self-pay | Source: Ambulatory Visit

## 2018-06-19 ENCOUNTER — Encounter (HOSPITAL_COMMUNITY): Payer: Self-pay | Admitting: Emergency Medicine

## 2018-06-19 ENCOUNTER — Other Ambulatory Visit: Payer: Self-pay

## 2018-06-19 ENCOUNTER — Emergency Department (HOSPITAL_COMMUNITY)
Admission: EM | Admit: 2018-06-19 | Discharge: 2018-06-19 | Disposition: A | Discharge status: Home / Self Care | Payer: No Typology Code available for payment source | Attending: Emergency Medicine | Admitting: Emergency Medicine

## 2018-06-19 DIAGNOSIS — I10 Essential (primary) hypertension: Secondary | ICD-10-CM | POA: Insufficient documentation

## 2018-06-19 DIAGNOSIS — Z79899 Other long term (current) drug therapy: Secondary | ICD-10-CM | POA: Insufficient documentation

## 2018-06-19 DIAGNOSIS — E1140 Type 2 diabetes mellitus with diabetic neuropathy, unspecified: Secondary | ICD-10-CM | POA: Insufficient documentation

## 2018-06-19 DIAGNOSIS — J45909 Unspecified asthma, uncomplicated: Secondary | ICD-10-CM | POA: Insufficient documentation

## 2018-06-19 DIAGNOSIS — Z794 Long term (current) use of insulin: Secondary | ICD-10-CM | POA: Insufficient documentation

## 2018-06-19 DIAGNOSIS — N3000 Acute cystitis without hematuria: Secondary | ICD-10-CM

## 2018-06-19 DIAGNOSIS — E860 Dehydration: Secondary | ICD-10-CM

## 2018-06-19 LAB — CBC
HEMATOCRIT: 38.8 % (ref 36.0–46.0)
HEMOGLOBIN: 12.4 g/dL (ref 12.0–15.0)
MCH: 31 pg (ref 26.0–34.0)
MCHC: 32 g/dL (ref 30.0–36.0)
MCV: 97 fL (ref 78.0–100.0)
Platelets: 226 10*3/uL (ref 150–400)
RBC: 4 MIL/uL (ref 3.87–5.11)
RDW: 12.1 % (ref 11.5–15.5)
WBC: 8.3 10*3/uL (ref 4.0–10.5)

## 2018-06-19 LAB — URINALYSIS, ROUTINE W REFLEX MICROSCOPIC
Bilirubin Urine: NEGATIVE
Glucose, UA: >=500 mg/dL — AB
Hgb urine dipstick: NEGATIVE
KETONES UR: 5 mg/dL — AB
Nitrite: NEGATIVE
PROTEIN: NEGATIVE mg/dL
Specific Gravity, Urine: 1.023 (ref 1.005–1.030)
pH: 5 (ref 5.0–8.0)

## 2018-06-19 LAB — CBG MONITORING, ED: GLUCOSE-CAPILLARY: 255 mg/dL — AB (ref 70–99)

## 2018-06-19 MED ORDER — CEPHALEXIN 500 MG PO CAPS: 500.0000 mg | ORAL_CAPSULE | Freq: Two times a day (BID) | ORAL | 0 refills | Status: AC

## 2018-06-19 MED ORDER — SODIUM CHLORIDE 0.9 % IV BOLUS
1000.0000 mL | Freq: Once | INTRAVENOUS | Status: AC
  Administered 2018-06-19: 1000 mL via INTRAVENOUS

## 2018-06-19 MED FILL — NOVOLOG MIX 70-30 FLEXPEN S: (70-30) 100 | 14 days supply | Qty: 15 | Fill #7

## 2018-06-19 NOTE — ED Provider Notes (Signed)
[facility] EMERGENCY DEPARTMENT Provider Note   CSN: [id number] Arrival date & time: 06/19/18  1449     History   Chief Complaint Chief Complaint  Patient presents with  . Hypotension  . Hyperglycemia    HPI [name] is a 57 y.o. female with a history of diabetes mellitus, HTN, and anemia who presents to the emergency department by EMS with lightheadedness that began this afternoon.  She reports that she was feeling generally unwell when she awoke this morning, which worsened when she began to feel lightheaded.  She states the symptoms began shortly after taking 100 units of 70/30 insulin, which the patient has been out of for the last 4 days.  She typically takes 55 units.  She reports that all she had eaten this morning it was a peanut butter sandwich prior to taking her insulin.   She reports 2 episodes of associated nonbloody diarrhea this morning.  She also endorses nausea, which is since resolved.  She denies fever, chills, vomiting, chest pain, dyspnea, headache, visual changes, numbness, or weakness.  She also noticed polydipsia and polyuria that began 4 days ago.  MS reports the patient's CBG was 290.  She was noted to be hyper tensive at 94/49 and was treated with a 500 mL bolus of normal saline.  The patient reports that her lightheadedness significantly improved following the IV fluid bolus.  The history is provided by the patient. No language interpreter was used.   Past Medical History:  Diagnosis Date  . Anemia   . Asthma 11/01/2010  . Diabetes mellitus    since 1983  . GERD (gastroesophageal reflux disease)   . Hypertension   . Osteomyelitis ([place])   . Pneumonia    as a child    Patient Active Problem List   Diagnosis Date Noted  . Long term current use of opiate analgesic 01/31/2017  . Viral gastroenteritis 12/07/2016  . Opioid dependence ([place]) 11/27/2016  . Morton neuroma 11/02/2016  . Broken teeth 11/02/2016  . Diabetic ulcer  of left foot ([place]) 11/02/2016  . Left shoulder pain 05/05/2016  . Left elbow pain 05/05/2016  . Hyperlipidemia associated with type 2 diabetes mellitus ([place]) 04/22/2016  . Severe obesity (BMI 35.0-39.9) 01/22/2016  . GERD (gastroesophageal reflux disease) 03/03/2014  . Health care maintenance 01/06/2014  . Elevated TSH 05/13/2013  . Wrist pain 04/11/2013  . BPPV (benign paroxysmal positional vertigo) 03/07/2013  . History of osteomyelitis 02/19/2013  . Neuropathy in diabetes ([place]) 11/01/2010  . ASTHMA 11/01/2010  . Hypertension associated with diabetes ([place]) 11/28/2008  . Uncontrolled diabetes mellitus with complications ([place]) 85/88/5027    Past Surgical History:  Procedure Laterality Date  . ACHILLES TENDON LENGTHENING Left 08/29/2014   Procedure: Left Achilles Lengthening;  Surgeon: Newt Minion, MD;  Location: [place];  Service: Orthopedics;  Laterality: Left;  . AMPUTATION Left 02/19/2013   Procedure: Amputation of Left Great Toe;  Surgeon: Mcarthur Rossetti, MD;  Location: [place];  Service: Orthopedics;  Laterality: Left;  . AMPUTATION Left 08/29/2014   Procedure: Left Foot 2nd and 1st Toe Amputation;  Surgeon: Newt Minion, MD;  Location: [place];  Service: Orthopedics;  Laterality: Left;  . CESAREAN SECTION     x 4  . TUBAL LIGATION       OB History   None      Home Medications    Prior to Admission medications   Medication Sig Start Date End Date Taking? Authorizing Provider  atorvastatin (LIPITOR) 40 MG tablet Take 1 tablet (40 mg total) by mouth daily. 02/09/18   Wallace, Andrew, DO  blood glucose meter kit and supplies Dispense based on patient and insurance preference. Use up to four times daily as directed. 10/09/17   Harbrecht, Lawrence, MD  Blood Glucose Monitoring Suppl (TRUE METRIX METER) DEVI 1 Device by Does not apply route 2 (two) times daily. 12/07/16   Strelow, Bryan, MD  cephALEXin (KEFLEX) 500 MG capsule Take 1 capsule (500 mg total) by mouth 2 (two)  times daily for 5 days. 06/19/18 06/24/18  McDonald, Mia A, PA-C  exenatide (BYETTA 5 MCG PEN) 5 MCG/0.02ML SOPN injection Inject 0.02 mLs (5 mcg total) into the skin 2 (two) times daily with a meal. 02/09/18 02/09/19  Wallace, Andrew, DO  gabapentin (NEURONTIN) 300 MG capsule Take 3 capsules (900 mg total) by mouth 3 (three) times daily. 02/09/18   Wallace, Andrew, DO  glucose blood test strip Use as instructed 10/09/17   Harbrecht, Lawrence, MD  hydrochlorothiazide (HYDRODIURIL) 25 MG tablet Take 0.5 tablets (12.5 mg total) by mouth daily. 02/09/18   Wallace, Andrew, DO  insulin aspart protamine - aspart (NOVOLOG MIX 70/30 FLEXPEN) (70-30) 100 UNIT/ML FlexPen Inject 0.55 mLs (55 Units total) into the skin 2 (two) times daily with a meal. IM program 02/09/18   Wallace, Andrew, DO  Insulin Pen Needle 29G X 5MM MISC Inject under the skin as prescribed. 10/09/17   Harbrecht, Lawrence, MD  Lancet Devices (AUTO-LANCET) MISC Prick finger for BG check as prescribed. 10/09/17   Harbrecht, Lawrence, MD  Lancets MISC 1 each by Does not apply route 4 (four) times daily -  before meals and at bedtime. 09/30/15   Burns, Alexa R, MD  lisinopril (PRINIVIL,ZESTRIL) 10 MG tablet Take 1 tablet (10 mg total) by mouth at bedtime. IM PROGRAM 05/14/18 08/12/18  Chundi, Vahini, MD  metFORMIN (GLUCOPHAGE) 1000 MG tablet TAKE 1 Tablet  BY MOUTH TWICE DAILY WITH A MEAL 02/09/18   Wallace, Andrew, DO  Multiple Vitamin (MULTIVITAMIN WITH MINERALS) TABS tablet Take 1 tablet by mouth daily.    [provider]  omeprazole (PRILOSEC) 20 MG capsule Take 1 capsule (20 mg total) by mouth daily. 02/09/18   Wallace, Andrew, DO    Family History Family History  Problem Relation Age of Onset  . Diabetes Father   . Hypertension Father   . Hypertension Mother   . Dementia Mother   . Diabetes Daughter     Social History Social History   Tobacco Use  . Smoking status: Never Smoker  . Smokeless tobacco: Never Used  Substance Use  Topics  . Alcohol use: No    Alcohol/week: 0.0 oz  . Drug use: No     Allergies   Aspirin; Ibuprofen; and Penicillins   Review of Systems Review of Systems  Constitutional: Negative for activity change, chills and fever.  Respiratory: Negative for shortness of breath.   Cardiovascular: Negative for chest pain.  Gastrointestinal: Positive for diarrhea and nausea. Negative for abdominal pain and vomiting.  Genitourinary: Negative for dysuria.  Musculoskeletal: Negative for back pain.       Malaise  Skin: Negative for rash.  Allergic/Immunologic: Negative for immunocompromised state.  Neurological: Positive for light-headedness. Negative for dizziness, syncope, weakness and headaches.  Psychiatric/Behavioral: Negative for confusion.   Physical Exam Updated Vital Signs BP 111/70   Pulse 84   Temp 97.6 F (36.4 C) (Oral)   Resp 17   Ht 6' 1" (1.854   m)   Wt 131.5 kg (290 lb)   LMP 06/23/2014   SpO2 98%   BMI 38.26 kg/m   Physical Exam  Constitutional: No distress.  HENT:  Head: Normocephalic.  Eyes: Conjunctivae are normal.  Neck: Neck supple.  Cardiovascular: Normal rate, regular rhythm, normal heart sounds and intact distal pulses. Exam reveals no gallop and no friction rub.  No murmur heard. Pulmonary/Chest: Effort normal. No stridor. No respiratory distress. She has no wheezes. She has no rales. She exhibits no tenderness.  Abdominal: Soft. Bowel sounds are normal. She exhibits no distension and no mass. There is no rebound and no guarding. No hernia.  Mildly tender to palpation in the suprapubic region.  No rebound or guarding.  No CVA tenderness bilaterally.  Musculoskeletal: She exhibits no tenderness.  Neurological: She is alert.  Skin: Skin is warm. Capillary refill takes less than 2 seconds. No rash noted.  Psychiatric: Her behavior is normal.  Nursing note and vitals reviewed.  ED Treatments / Results  Labs (all labs ordered are listed, but only abnormal  results are displayed) Labs Reviewed  URINALYSIS, ROUTINE W REFLEX MICROSCOPIC - Abnormal; Notable for the following components:      Result Value   APPearance CLOUDY (*)    Glucose, UA >=500 (*)    Ketones, ur 5 (*)    Leukocytes, UA SMALL (*)    Bacteria, UA FEW (*)    All other components within normal limits  CBG MONITORING, ED - Abnormal; Notable for the following components:   Glucose-Capillary 255 (*)    All other components within normal limits  URINE CULTURE  CBC    EKG EKG Interpretation  Date/Time:  Tuesday June 19 2018 14:59:59 EDT Ventricular Rate:  91 PR Interval:    QRS Duration: 78 QT Interval:  353 QTC Calculation: 435 R Axis:   30 Text Interpretation:  Sinus rhythm Abnormal R-wave progression, early transition No acute changes Nonspecific ST and T wave abnormality Confirmed by Varney Biles [phone]) on 06/19/2018 4:49:07 PM   Radiology No results found.  Procedures Procedures (including critical care time)  Medications Ordered in ED Medications  sodium chloride 0.9 % bolus 1,000 mL (0 mLs Intravenous Stopped 06/19/18 1827)     Initial Impression / Assessment and Plan / ED Course  I have reviewed the triage vital signs and the nursing notes.  Pertinent labs & imaging results that were available during my care of the patient were reviewed by me and considered in my medical decision making (see chart for details).     57 year old female with a history of diabetes mellitus, HTN, and anemia who presents to the emergency department by EMS with general malaise, onset this morning, and lightheadedness that began this afternoon.  Symptoms began after she took 100 units of her home insulin.  She typically takes 55 units, but has been out of the medication for the last 3 days.  Positive orthostatics.  IV fluid bolus given.  Patient was discussed with Dr. Kathrynn Humble, attending physician.  Labs reviewed.  CBG 255.  No signs or symptoms of DKA or HHS.  UA concerning  for infection.  Culture sent and we will discharge the patient with a 5-day course of Keflex.  Following IV fluids, she reports lightheadedness has resolved.  She is feeling much better.  Recommended follow-up with her PCP for reevaluation this week.  Strict return precautions given.  She is hemodynamically stable and in no acute distress.  She is safe for discharge  home at this time.  Final Clinical Impressions(s) / ED Diagnoses   Final diagnoses:  Dehydration  Acute cystitis without hematuria    ED Discharge Orders        Ordered    cephALEXin (KEFLEX) 500 MG capsule  2 times daily     06/19/18 1858       Attallah Ontko A, PA-C 06/20/18 3825    Varney Biles, MD 06/20/18 [phone]

## 2018-06-19 NOTE — ED Triage Notes (Signed)
Per EMS pt was at work and complains of not feeling well since this morning.  She ran out of her insulin Saturday and just got it filled today.  She states having polyuria and increased thirst.  Her CBG with EMS was 290.  She also is hypotensive with her last BP being 94/49 with 536ml of NS given in route.  AOx4 NAD at this time.

## 2018-06-19 NOTE — Discharge Instructions (Addendum)
Thank you for allowing me to care for you today in the Emergency Department.   Continue to take your home insulin as prescribed and check your blood sugar as directed.  Call to schedule a recheck with your primary care provider in the next 3 to 4 days.  Your urine looked consistent with infection.  Take 1 tablet of Keflex 2 times daily for the next 5 days.   Return to the emergency department if you develop severe shortness of breath, chest pain, if you pass out, develop severe vomiting, or other new, concerning symptoms.

## 2018-06-20 LAB — URINE CULTURE

## 2018-06-22 ENCOUNTER — Other Ambulatory Visit: Payer: Self-pay | Admitting: Internal Medicine

## 2018-06-22 DIAGNOSIS — E1165 Type 2 diabetes mellitus with hyperglycemia: Secondary | ICD-10-CM

## 2018-06-22 DIAGNOSIS — E118 Type 2 diabetes mellitus with unspecified complications: Principal | ICD-10-CM

## 2018-06-22 DIAGNOSIS — Z794 Long term (current) use of insulin: Principal | ICD-10-CM

## 2018-06-22 DIAGNOSIS — I10 Essential (primary) hypertension: Secondary | ICD-10-CM

## 2018-06-22 DIAGNOSIS — IMO0002 Reserved for concepts with insufficient information to code with codable children: Secondary | ICD-10-CM

## 2018-06-25 ENCOUNTER — Ambulatory Visit (INDEPENDENT_AMBULATORY_CARE_PROVIDER_SITE_OTHER): Payer: No Typology Code available for payment source | Admitting: Internal Medicine

## 2018-06-25 ENCOUNTER — Other Ambulatory Visit: Payer: Self-pay

## 2018-06-25 ENCOUNTER — Encounter: Payer: Self-pay | Admitting: Internal Medicine

## 2018-06-25 ENCOUNTER — Ambulatory Visit (HOSPITAL_COMMUNITY)
Admission: RE | Admit: 2018-06-25 | Discharge: 2018-06-25 | Disposition: A | Discharge status: Home / Self Care | Payer: No Typology Code available for payment source | Source: Ambulatory Visit | Attending: Internal Medicine | Admitting: Internal Medicine

## 2018-06-25 VITALS — BP 101/41 | HR 88 | Temp 98.2°F | Ht 73.0 in | Wt 286.0 lb

## 2018-06-25 DIAGNOSIS — E1165 Type 2 diabetes mellitus with hyperglycemia: Secondary | ICD-10-CM

## 2018-06-25 DIAGNOSIS — L97423 Non-pressure chronic ulcer of left heel and midfoot with necrosis of muscle: Secondary | ICD-10-CM

## 2018-06-25 DIAGNOSIS — Z79899 Other long term (current) drug therapy: Secondary | ICD-10-CM

## 2018-06-25 DIAGNOSIS — E11621 Type 2 diabetes mellitus with foot ulcer: Secondary | ICD-10-CM

## 2018-06-25 DIAGNOSIS — Z794 Long term (current) use of insulin: Secondary | ICD-10-CM

## 2018-06-25 DIAGNOSIS — I10 Essential (primary) hypertension: Secondary | ICD-10-CM

## 2018-06-25 DIAGNOSIS — L97529 Non-pressure chronic ulcer of other part of left foot with unspecified severity: Secondary | ICD-10-CM

## 2018-06-25 DIAGNOSIS — E118 Type 2 diabetes mellitus with unspecified complications: Secondary | ICD-10-CM

## 2018-06-25 DIAGNOSIS — IMO0002 Reserved for concepts with insufficient information to code with codable children: Secondary | ICD-10-CM

## 2018-06-25 DIAGNOSIS — I152 Hypertension secondary to endocrine disorders: Secondary | ICD-10-CM

## 2018-06-25 DIAGNOSIS — Z89412 Acquired absence of left great toe: Secondary | ICD-10-CM

## 2018-06-25 DIAGNOSIS — E1159 Type 2 diabetes mellitus with other circulatory complications: Secondary | ICD-10-CM

## 2018-06-25 LAB — POCT GLYCOSYLATED HEMOGLOBIN (HGB A1C): Hemoglobin A1C: 11.5 % — AB (ref 4.0–5.6)

## 2018-06-25 LAB — GLUCOSE, CAPILLARY: GLUCOSE-CAPILLARY: 407 mg/dL — AB (ref 70–99)

## 2018-06-25 MED ORDER — LISINOPRIL 10 MG PO TABS: 10.0000 mg | ORAL_TABLET | Freq: Every day | ORAL | 1 refills | Status: DC

## 2018-06-25 MED ORDER — LISINOPRIL 10 MG PO TABS: 5.0000 mg | ORAL_TABLET | Freq: Every day | ORAL | 1 refills | Status: DC

## 2018-06-25 NOTE — Progress Notes (Signed)
CC: left diabetic foot ulcer, T2DM, HTN follow up,   HPI:  [name] is a 57 y.o. female with PMH below.  She is here to address her left diabetic foot ulcer, T2DM, HTN follow up.  Please see A&P for status of the patient's chronic medical conditions  Past Medical History:  Diagnosis Date  . Anemia   . Asthma 11/01/2010  . Diabetes mellitus    since 1983  . GERD (gastroesophageal reflux disease)   . Hypertension   . Osteomyelitis ([place])   . Pneumonia    as a child   Review of Systems:  ROS: Pulmonary: pt denies increased work of breathing, shortness of breath,  Cardiac: pt denies palpitations, chest pain,  Abdominal: pt denies abdominal pain, nausea, vomiting, or diarrhea  Physical Exam:  Vitals:   06/25/18 1500  BP: (!) 101/41  Pulse: 88  Temp: 98.2 F (36.8 C)  TempSrc: Oral  SpO2: 99%  Weight: 286 lb (129.7 kg)  Height: 6\' 1"  (1.854 m)   Physical Exam  Constitutional: No distress.  Cardiovascular: Normal rate, regular rhythm and normal heart sounds. Exam reveals no gallop and no friction rub.  No murmur heard. Pulmonary/Chest: Effort normal and breath sounds normal. No respiratory distress. She has no wheezes. She has no rales. She exhibits no tenderness.  Abdominal: Soft. Bowel sounds are normal. She exhibits no distension and no mass. There is no tenderness. There is no rebound and no guarding.  Neurological: She is alert.  Skin: She is not diaphoretic.  There is a small ulceration between the first and second metatarsals of the left foot.  The ulceration is deep and extends into the muscle without visible bone.  It is non painful, non erythematous.      Media Information         Document Information   Photos    06/25/2018 15:29  Attached To:  Office Visit on 06/25/18 with Katherine Roan, MD   Source Information   Reedy Biernat, Jenne Pane, MD  [facility]        Social History   Socioeconomic History  . Marital status:  Legally Separated    Spouse name: Not on file  . Number of children: Not on file  . Years of education: Not on file  . Highest education level: Not on file  Occupational History  . Not on file  Social Needs  . Financial resource strain: Not on file  . Food insecurity:    Worry: Not on file    Inability: Not on file  . Transportation needs:    Medical: Not on file    Non-medical: Not on file  Tobacco Use  . Smoking status: Never Smoker  . Smokeless tobacco: Never Used  Substance and Sexual Activity  . Alcohol use: No    Alcohol/week: 0.0 oz  . Drug use: No  . Sexual activity: Not on file  Lifestyle  . Physical activity:    Days per week: Not on file    Minutes per session: Not on file  . Stress: Not on file  Relationships  . Social connections:    Talks on phone: Not on file    Gets together: Not on file    Attends religious service: Not on file    Active member of club or organization: Not on file    Attends meetings of clubs or organizations: Not on file    Relationship status: Not on file  . Intimate partner violence:  Fear of current or ex partner: Not on file    Emotionally abused: Not on file    Physically abused: Not on file    Forced sexual activity: Not on file  Other Topics Concern  . Not on file  Social History Narrative  . Not on file    Family History  Problem Relation Age of Onset  . Diabetes Father   . Hypertension Father   . Hypertension Mother   . Dementia Mother   . Diabetes Daughter     Assessment & Plan:   See Encounters Tab for problem based charting.  Patient discussed with Dr. Rebeca Alert

## 2018-06-25 NOTE — Patient Instructions (Signed)
[name], we will get some labwork today.  I have ordered an x-ray of your foot while we await approval for your MRI.  Please take a log of your blood sugars and bring this back in about 1-2 weeks so we can adjust your insulin.  We have decreased your lisinopril to 5mg  daily.

## 2018-06-26 ENCOUNTER — Telehealth: Payer: Self-pay | Admitting: Internal Medicine

## 2018-06-26 LAB — BMP8+ANION GAP
ANION GAP: 16 mmol/L (ref 10.0–18.0)
BUN/Creatinine Ratio: 16 (ref 9–23)
BUN: 11 mg/dL (ref 6–24)
CALCIUM: 9.3 mg/dL (ref 8.7–10.2)
CO2: 23 mmol/L (ref 20–29)
Chloride: 97 mmol/L (ref 96–106)
Creatinine, Ser: 0.68 mg/dL (ref 0.57–1.00)
GFR calc Af Amer: 113 mL/min/{1.73_m2} (ref >59)
GFR, EST NON AFRICAN AMERICAN: 98 mL/min/{1.73_m2} (ref >59)
Glucose: 342 mg/dL — ABNORMAL HIGH (ref 65–99)
Potassium: 4.5 mmol/L (ref 3.5–5.2)
Sodium: 136 mmol/L (ref 134–144)

## 2018-06-26 LAB — SEDIMENTATION RATE: Sed Rate: 31 mm/hr (ref 0–40)

## 2018-06-26 LAB — CBC
Hematocrit: 39.7 % (ref 34.0–46.6)
Hemoglobin: 13.3 g/dL (ref 11.1–15.9)
MCH: 32.7 pg (ref 26.6–33.0)
MCHC: 33.5 g/dL (ref 31.5–35.7)
MCV: 98 fL — AB (ref 79–97)
Platelets: 280 10*3/uL (ref 150–450)
RBC: 4.07 x10E6/uL (ref 3.77–5.28)
RDW: 13.1 % (ref 12.3–15.4)
WBC: 6.6 10*3/uL (ref 3.4–10.8)

## 2018-06-26 LAB — C-REACTIVE PROTEIN: CRP: 7 mg/L (ref 0–10)

## 2018-06-26 NOTE — Telephone Encounter (Signed)
We discussed reassuring results ESR C RP, CBC, foot x-ray.  She is doing well she is going to be taken off of her feet at work to relieve pressure.  Blood sugar was doing well today she is keeping a log as instructed will return in 2 weeks with 

## 2018-06-26 NOTE — Assessment & Plan Note (Signed)
BP Readings from Last 3 Encounters:  06/25/18 (!) 101/41  06/19/18 111/70  02/09/18 118/68   Patient's blood pressures continue to trend downwards.  I expect she might be diuresing elevated sugars.  Attempted to stop patient's hydrochlorothiazide today however she is stopped it on her own in the past week.    -We will decrease lisinopril 10 mg daily to 5 mg

## 2018-06-26 NOTE — Assessment & Plan Note (Signed)
Left foot wound since April or May treated at wound center.  Drainage has been increasing was seen two weeks ago at wound care center. Wound specialist said it wasn't worse but wasn't better and was concerned that she was on her feet all day.   No meter today checks her sugar at home reports they have been high at home.    Prescribed keflex by ED doctor for ? Of UTI but did not take this.  I will attach a picture to this note today.  The wound does not look grossly infected.  She has no systemic sings of infection.  She was supposed to get an MRI of the foot but she missed her appointment and her orange card letter is now expired.  She has an appointment to renew in August with Bonna Gains.    -ordered an x-ray of the foot and CRP/ESR, CBC which are not consistent with infection at this time. -continue following with wound care -better blood sugar control is paramount -wrote letter allowing patient a stool at work to keep pressure off foot

## 2018-06-26 NOTE — Assessment & Plan Note (Signed)
Lab Results  Component Value Date   HGBA1C 11.5 (A) 06/25/2018   Patient was under the impression that she was coming just to evaluate her foot therefore she did not bring any of her meter values.  She says that her blood sugar has been higher than usual at home however she does report blood sugars as low as 140s.  This makes it difficult for me to just empirically increase her insulin 70/30, she is currently on 55 units twice daily.  Reports that she is alternating locations where she injects appropriately.  Doesn't notice any changes in diet doesn't eat much breads pasta or rice.  Uses rye bread or wraps.  Eating less, does not notice polyuria.  Feels well, no fevers or chills.  -We will evaluate the foot further today check for signs of inflammation and infection which may be contributing to insulin resistance -Gave the patient a blood sugar log to return in about 1 week to 2 weeks at the most so we can get her blood sugar under better control

## 2018-06-26 NOTE — Progress Notes (Signed)
Internal Medicine Clinic Attending  Case discussed with Dr. Winfrey at the time of the visit.  We reviewed the resident's history and exam and pertinent patient test results.  I agree with the assessment, diagnosis, and plan of care documented in the resident's note.  Alexander Raines, M.D., Ph.D.  

## 2018-06-27 ENCOUNTER — Other Ambulatory Visit: Payer: Self-pay | Admitting: *Deleted

## 2018-06-27 DIAGNOSIS — I10 Essential (primary) hypertension: Principal | ICD-10-CM

## 2018-06-27 DIAGNOSIS — I152 Hypertension secondary to endocrine disorders: Secondary | ICD-10-CM

## 2018-06-27 DIAGNOSIS — E1159 Type 2 diabetes mellitus with other circulatory complications: Secondary | ICD-10-CM

## 2018-06-27 MED ORDER — LISINOPRIL 5 MG PO TABS: 5.0000 mg | ORAL_TABLET | Freq: Every day | ORAL | 1 refills | Status: DC

## 2018-06-27 NOTE — Telephone Encounter (Signed)
refilled 

## 2018-06-29 ENCOUNTER — Encounter: Payer: No Typology Code available for payment source | Admitting: Internal Medicine

## 2018-07-09 ENCOUNTER — Encounter: Payer: Self-pay | Admitting: Internal Medicine

## 2018-07-09 ENCOUNTER — Encounter: Payer: Self-pay | Admitting: Dietician

## 2018-07-09 ENCOUNTER — Ambulatory Visit (INDEPENDENT_AMBULATORY_CARE_PROVIDER_SITE_OTHER): Payer: No Typology Code available for payment source | Admitting: Internal Medicine

## 2018-07-09 ENCOUNTER — Other Ambulatory Visit: Payer: Self-pay

## 2018-07-09 ENCOUNTER — Telehealth: Payer: Self-pay | Admitting: *Deleted

## 2018-07-09 ENCOUNTER — Ambulatory Visit: Payer: No Typology Code available for payment source | Admitting: Dietician

## 2018-07-09 VITALS — BP 158/69 | HR 90 | Temp 99.1°F | Ht 73.0 in | Wt 281.0 lb

## 2018-07-09 DIAGNOSIS — I10 Essential (primary) hypertension: Secondary | ICD-10-CM

## 2018-07-09 DIAGNOSIS — L97423 Non-pressure chronic ulcer of left heel and midfoot with necrosis of muscle: Secondary | ICD-10-CM

## 2018-07-09 DIAGNOSIS — E1165 Type 2 diabetes mellitus with hyperglycemia: Secondary | ICD-10-CM

## 2018-07-09 DIAGNOSIS — E118 Type 2 diabetes mellitus with unspecified complications: Principal | ICD-10-CM

## 2018-07-09 DIAGNOSIS — Z794 Long term (current) use of insulin: Secondary | ICD-10-CM

## 2018-07-09 DIAGNOSIS — IMO0002 Reserved for concepts with insufficient information to code with codable children: Secondary | ICD-10-CM

## 2018-07-09 DIAGNOSIS — E11621 Type 2 diabetes mellitus with foot ulcer: Secondary | ICD-10-CM

## 2018-07-09 DIAGNOSIS — E1159 Type 2 diabetes mellitus with other circulatory complications: Secondary | ICD-10-CM

## 2018-07-09 DIAGNOSIS — Z79899 Other long term (current) drug therapy: Secondary | ICD-10-CM

## 2018-07-09 DIAGNOSIS — I152 Hypertension secondary to endocrine disorders: Secondary | ICD-10-CM

## 2018-07-09 DIAGNOSIS — L97529 Non-pressure chronic ulcer of other part of left foot with unspecified severity: Secondary | ICD-10-CM

## 2018-07-09 MED ORDER — GABAPENTIN 300 MG PO CAPS: 300.0000 mg | ORAL_CAPSULE | Freq: Three times a day (TID) | ORAL | 3 refills | Status: DC

## 2018-07-09 MED ORDER — BLOOD GLUCOSE MONITOR KIT: PACK | 0 refills | Status: DC

## 2018-07-09 MED ORDER — EXENATIDE 5 MCG/0.02ML ~~LOC~~ SOPN: 10.0000 ug | PEN_INJECTOR | Freq: Two times a day (BID) | SUBCUTANEOUS | 3 refills | Status: DC

## 2018-07-09 MED ORDER — GABAPENTIN 300 MG PO CAPS: 300.0000 mg | ORAL_CAPSULE | Freq: Three times a day (TID) | ORAL | 0 refills | Status: DC

## 2018-07-09 MED ORDER — EXENATIDE 10 MCG/0.04ML ~~LOC~~ SOPN: 10.0000 ug | PEN_INJECTOR | Freq: Two times a day (BID) | SUBCUTANEOUS | 3 refills | Status: DC

## 2018-07-09 MED ORDER — INSULIN ASPART PROT & ASPART (70-30 MIX) 100 UNIT/ML PEN: 55.0000 [IU] | PEN_INJECTOR | Freq: Two times a day (BID) | SUBCUTANEOUS | 11 refills | Status: DC

## 2018-07-09 MED FILL — CONTOUR NEXT METER: W/DEVICE | 25 days supply | Qty: 1 | Fill #0

## 2018-07-09 MED FILL — NOVOLOG MIX 70-30 FLEXPEN S: (70-30) 100 | 14 days supply | Qty: 15 | Fill #0

## 2018-07-09 MED FILL — MICROLET LANCETS MISC: 25 days supply | Qty: 100 | Fill #0

## 2018-07-09 MED FILL — CONTOUR NEXT STRIPS: 25 days supply | Qty: 100 | Fill #0

## 2018-07-09 NOTE — Telephone Encounter (Signed)
Gabapentin is to be 3 capsules 3 times daily = 270 per month, please send new script with IM Program on it

## 2018-07-09 NOTE — Telephone Encounter (Signed)
corrected

## 2018-07-09 NOTE — Assessment & Plan Note (Signed)
Left diabetic foot ulcer: 93-monthhistory of left foot ulcer that was previously been managed by RDellia Nims  Patient has been unable to make some of her appointments due to her work schedule.  She was last seen at our clinic on 06/25/2018 was found to have a small deep ulceration between the first and second metatarsal of the left foot.  She did not endorse tenderness, fever, chills or discharge at that visit.  She had ESR, CRP, CBC, x-ray of the foot performed which came back normal.  Today, she complains of malodorous serous drainage from the ulcer with tenderness on the dorsal aspect of the left foot.  She continues to deny fever or chills.  Physical exam noticeable for serous discharge and 3 to 4 mm ulceration at the plantar aspect between the first and second metatarsal.  My suspicion for osteomyelitis is given her previous negative lab findings and constitutional symptoms. -Stat MRI -She has an appointment at the clinic on Monday and will discuss podiatry referral. -I called Dr. RJanalyn Rouseoffice and they plan to call the patient for close visit in the next couple days

## 2018-07-09 NOTE — Assessment & Plan Note (Signed)
Uncontrolled diabetes mellitus: A1c on 7/29 2019 of 11.5.  Her home blood glucose has ranged from 170s to 300s.  Only on NovoLog 70/30, metformin 1000 mg twice daily, Byetta 5 mcg twice daily.  Blood sugar continues to remain uncontrolled which which is directly affecting wound healing of the left foot ulcer. -Increase Byetta to 10 mcg 2 times daily -Given prescription for glucometer and advised to record fasting and postprandial glucose -Follow-up Monday with glucose log

## 2018-07-09 NOTE — Telephone Encounter (Signed)
Thank you Helen 

## 2018-07-09 NOTE — Assessment & Plan Note (Signed)
Hypertension: She was found to have low BP of 101/41 at her last visit on 06/25/2018.  She was previously taking lisinopril 10 mg which was decreased to 5 mg daily at that visit.  They her BP measures 158/69.  Given her labile BP, less inclined to go up on her lisinopril without appropriate range.  I have given patient a blood pressure machine today with a BP log and have advised her to keep a log of her BP and return on Monday -Continue lisinopril 5 mg daily -New blood pressure machine given today -Return to clinic on Monday with BP logs.

## 2018-07-09 NOTE — Addendum Note (Signed)
Addended by: Jean Rosenthal on: 07/09/2018 03:54 PM   Modules accepted: Orders

## 2018-07-09 NOTE — Progress Notes (Signed)
She is interested in retinal images, but at a later date. Rescheduled for 07/26/18.

## 2018-07-09 NOTE — Telephone Encounter (Signed)
[facility] calls: 1) please send a new script for byetta 60mcg pens 2) gabapentin, please send a new script with IM program on it somewhere and they state the pt did not know the dose was changed today 3) none of the printed scripts for diabetes supplies had IM program on them, since they were printed they took a VO from me for im program

## 2018-07-09 NOTE — Progress Notes (Signed)
CC: Follow-up diabetes, diabetic foot ulcer and hypertension  HPI:  [name] is a 57 y.o. African-American female with past medical history as listed below presenting for follow-up diabetes, left diabetic foot ulcer and hypertension.  Left diabetic foot ulcer: 3-month history of left foot ulcer that was previously been managed by [facility].  Patient has been unable to make some of her appointments due to her work schedule.  She was last seen at our clinic on 06/25/2018 was found to have a small deep ulceration between the first and second metatarsal of the left foot.  She did not endorse tenderness, fever, chills or discharge at that visit.  She had ESR, CRP, CBC, x-ray of the foot performed which came back normal.  Today, she complains of malodorous serous drainage from the ulcer with tenderness on the dorsal aspect of the left foot.  She continues to deny fever or chills.  Physical exam noticeable for serous discharge and 3 to 4 mm ulceration at the plantar aspect between the first and second metatarsal.  My suspicion for osteomyelitis is given her previous negative lab findings and constitutional symptoms. -Stat MRI -She has an appointment at the clinic on Monday and will discuss podiatry referral. -I called Dr. Robson's office and they plan to call the patient for close visit in the next couple days  Uncontrolled diabetes mellitus: A1c on 7/29 2019 of 11.5.  Her home blood glucose has ranged from 170s to 300s.  Only on NovoLog 70/30, metformin 1000 mg twice daily, Byetta 5 mcg twice daily.  Blood sugar continues to remain uncontrolled which which is directly affecting wound healing of the left foot ulcer. -Increase Byetta to 10 mcg 2 times daily -Given prescription for glucometer and advised to record fasting and postprandial glucose -Follow-up Monday with glucose log  Hypertension: She was found to have low BP of 101/41 at her last visit on 06/25/2018.  She was previously taking  lisinopril 10 mg which was decreased to 5 mg daily at that visit.  They her BP measures 158/69.  Given her labile BP, less inclined to go up on her lisinopril without appropriate range.  I have given patient a blood pressure machine today with a BP log and have advised her to keep a log of her BP and return on Monday -Continue lisinopril 5 mg daily -New blood pressure machine given today -Return to clinic on Monday with BP logs.   Past Medical History:  Diagnosis Date  . Anemia   . Asthma 11/01/2010  . Diabetes mellitus    since 1983  . GERD (gastroesophageal reflux disease)   . Hypertension   . Osteomyelitis ([facility])   . Pneumonia    as a child   Review of Systems: Per HPI  Physical Exam:  Vitals:   07/09/18 1331  BP: (!) 158/69  Pulse: 90  Temp: 99.1 F (37.3 C)  TempSrc: Oral  SpO2: 100%  Weight: 281 lb (127.5 kg)  Height: 6' 1" (1.854 m)   Constitutional: In no acute distress, very pleasant lady Cardiovascular: Regular rate and rhythm, no murmurs, gallops, rubs Respiratory: Clear to auscultation bilaterally  Abdomen: Soft, nontender, nondistended Extremity: Left foot with 3 to 4 mm deep ulceration at the plantar surface between the first and second metatarsal, serous fluid noted, malodorous, tissues around ulcer nonviable.  Also surface of the left foot tender to palpation.   Assessment & Plan:   See Encounters Tab for problem based charting.  Patient discussed with Dr. Butcher  

## 2018-07-09 NOTE — Patient Instructions (Addendum)
Ms [name],   It was a pleasure taking care of you here at the clinic and sorry to hear of your pain. I have arranged for an MRI of your foot.   Your blood sugar is still high so I will increase the Byetta to 11mcg. I have given you a prescription to get a glucometer.   I am giving you a BP machine to check your home Bps. Please return next week with the blood pressure log paper.   Please call Dr. Dellia Nims to set up an appointment as we discussed   ~Dr. Eileen Stanford

## 2018-07-09 NOTE — Addendum Note (Signed)
Addended by: Jean Rosenthal on: 07/09/2018 04:46 PM   Modules accepted: Orders

## 2018-07-10 ENCOUNTER — Ambulatory Visit (HOSPITAL_COMMUNITY): Payer: No Typology Code available for payment source

## 2018-07-10 NOTE — Progress Notes (Signed)
Internal Medicine Clinic Attending  I saw and evaluated the patient.  I personally confirmed the key portions of the history and exam documented by Dr. Eileen Stanford and I reviewed pertinent patient test results.  The assessment, diagnosis, and plan were formulated together and I agree with the documentation in the resident's note. [name] is concerned that she has osteo. This is a concern but my suspension is not high enough that I feel abx are indicated today (wound since mid April, one week ago xray, CRP, and sed rate were nl). I could not advance a sterile probe more than 2 or 3 mm, no bone visible in base of ulcer, no odor, no sig erythema but rather chronic appearing hyperpigmentation. I feel that if she does have osteo, it would be chronic osteo. Delaying ABX would allow for bone cx if she requires surgical intervenetion. We are arranging stat MRI, F/U wound care, and close F/U.

## 2018-07-11 ENCOUNTER — Encounter (HOSPITAL_COMMUNITY): Payer: Self-pay

## 2018-07-11 ENCOUNTER — Ambulatory Visit (HOSPITAL_COMMUNITY)
Admission: RE | Admit: 2018-07-11 | Discharge: 2018-07-11 | Disposition: A | Discharge status: Home / Self Care | Payer: Self-pay | Source: Ambulatory Visit | Attending: Internal Medicine | Admitting: Internal Medicine

## 2018-07-11 DIAGNOSIS — E11621 Type 2 diabetes mellitus with foot ulcer: Secondary | ICD-10-CM

## 2018-07-11 DIAGNOSIS — L97423 Non-pressure chronic ulcer of left heel and midfoot with necrosis of muscle: Secondary | ICD-10-CM

## 2018-07-11 NOTE — Progress Notes (Signed)
Pt was attempted for her MRI of her left foot. Pt was unable to be placed into bore of scanner. Pt states she is too claustrophobic and HAS to be knocked out for her scan. Pt states she has had prior emotional trauma from being in a hyperbaric chamber.

## 2018-07-12 ENCOUNTER — Telehealth: Payer: Self-pay | Admitting: Internal Medicine

## 2018-07-12 NOTE — Telephone Encounter (Addendum)
I was notified that patient missed her MRI appointment yesterday.  She has a history of diabetic foot ulcer and an order was placed for stat MRI.  Reason behind patient missing this appointment remains unknown.  During my encounter with her last week, I made an urgent appointment with wound care center at Dr. Janalyn Rouse office and spoke to the PA who made me aware they were going to make an appointment for the patient in the next couple days.  I reached out to the patient via telephone call and was unable to reach her.  I left a voicemail and asked her to call at her earliest convenience.

## 2018-07-13 NOTE — Telephone Encounter (Signed)
Informed by [facility] clinical staff that pt was unable to obtain mri (claustrophobic).  Pt will need an open MRI, but a new order will need to be placed-will send to ordering MD to place new order.Despina Hidden Cassady8/16/20194:49 PM

## 2018-07-16 ENCOUNTER — Ambulatory Visit (INDEPENDENT_AMBULATORY_CARE_PROVIDER_SITE_OTHER): Payer: No Typology Code available for payment source | Admitting: Internal Medicine

## 2018-07-16 ENCOUNTER — Other Ambulatory Visit: Payer: Self-pay

## 2018-07-16 ENCOUNTER — Other Ambulatory Visit: Payer: Self-pay | Admitting: Internal Medicine

## 2018-07-16 VITALS — BP 141/69 | HR 81 | Temp 98.2°F | Ht 73.0 in | Wt 289.7 lb

## 2018-07-16 DIAGNOSIS — E11621 Type 2 diabetes mellitus with foot ulcer: Secondary | ICD-10-CM

## 2018-07-16 DIAGNOSIS — L97423 Non-pressure chronic ulcer of left heel and midfoot with necrosis of muscle: Secondary | ICD-10-CM

## 2018-07-16 DIAGNOSIS — L97529 Non-pressure chronic ulcer of other part of left foot with unspecified severity: Secondary | ICD-10-CM

## 2018-07-16 NOTE — Progress Notes (Signed)
I have placed a new order for an open MRI for left foot diabetic ulcer

## 2018-07-16 NOTE — Progress Notes (Signed)
   CC: Follow-up left diabetic foot ulcer  HPI:  [name] is a 57 y.o. African-American woman with left diabetic foot ulcer presenting for follow-up.  Left diabetic foot ulcer: [name] was last seen at the clinic on 07/09/2018 for evaluation of her ulcer.  During that visit, she reported of tenderness, swelling and discharge from her wound however did not endorse erythema, fevers, chills or diaphoresis.  An order was placed for stat MRI but patient was a no-show for her appointment because she reports of being claustrophobic.  She was previously seen by wound care specialist Dr. Dellia Nims and an attempt was made to for her to have an urgent follow-up but she reports of not receiving a call from the office.  Since her last clinic visit she has self administered an "antibiotic" and today she reports of improvement with left foot swelling, tenderness and discharge.  I have asked patient to call and provide name of antibiotic at her earliest convenience.  Currently, my suspicion for osteomyelitis is low and would await MRI findings before making further judgment as far as initiating antibiotic therapy. -Order for an open MRI placed -Follow-up with wound care  Past Medical History:  Diagnosis Date  . Anemia   . Asthma 11/01/2010  . Diabetes mellitus    since 1983  . GERD (gastroesophageal reflux disease)   . Hypertension   . Osteomyelitis ([place])   . Pneumonia    as a child   Review of Systems: As per HPI  Physical Exam:  Vitals:   07/16/18 1030  BP: (!) 141/69  Pulse: 81  Temp: 98.2 F (36.8 C)  TempSrc: Oral  SpO2: 100%  Weight: 289 lb 11.2 oz (131.4 kg)  Height: 6\' 1"  (1.854 m)   Constitutional: In no apparent distress Cardiovascular: Normal rate and rhythm.  No murmurs, gallops, rubs Respiratory: Clear to auscultation bilaterally.  No wheezes, crackles, rhonchi. Extremity: Ulcer at the plantar surface of left foot between the first and second metatarsal with surrounding  hyperpigmentation, muscle and bone cannot be visualized.  No discharge, nontender to palpation, nonerythematous  Assessment & Plan:   See Encounters Tab for problem based charting.  Patient discussed with Dr. Lynnae January

## 2018-07-16 NOTE — Telephone Encounter (Signed)
Hi Kaye,   I have placed a new order for the open MRI  Thank You

## 2018-07-16 NOTE — Assessment & Plan Note (Signed)
Left diabetic foot ulcer: [name] was last seen at the clinic on 07/09/2018 for evaluation of her ulcer.  During that visit, she reported of tenderness, swelling and discharge from her wound however did not endorse erythema, fevers, chills or diaphoresis.  An order was placed for stat MRI but patient was a no-show for her appointment because she reports of being claustrophobic.  She was previously seen by wound care specialist Dr. Dellia Nims and an attempt was made to for her to have an urgent follow-up but she reports of not receiving a call from the office.  Since her last clinic visit she has self administered an "antibiotic" and today she reports of improvement with left foot swelling, tenderness and discharge.  I have asked patient to call back in the clinic with the name of the antibiotic she took.  Currently, my suspicion for osteomyelitis is low and would await MRI findings before making further judgment as far as initiating antibiotic therapy.  -Order for an open MRI placed -Follow-up with wound care

## 2018-07-16 NOTE — Patient Instructions (Signed)
[name],  It was a pleasure taking care of you here at the clinic and it was quite unfortunate that you were not able to get the MRI of her left foot.  I have placed a new order for an open MRI which we want to get done as soon as possible.  Please follow-up at Dr. Janalyn Rouse office for further evaluation and possible debridement of the skin.  ~Dr. Eileen Stanford

## 2018-07-17 NOTE — Progress Notes (Signed)
nternal Medicine Clinic Attending  I saw and evaluated the patient.  I personally confirmed the key portions of the history and exam documented by Dr. Eileen Stanford and I reviewed pertinent patient test results.  The assessment, diagnosis, and plan were formulated together and I agree with the documentation in the resident's note.

## 2018-07-18 NOTE — Addendum Note (Signed)
Addended by: Jean Rosenthal on: 07/18/2018 08:45 AM   Modules accepted: Level of Service

## 2018-07-25 ENCOUNTER — Ambulatory Visit
Admission: RE | Admit: 2018-07-25 | Discharge: 2018-07-25 | Disposition: A | Discharge status: Home / Self Care | Payer: No Typology Code available for payment source | Source: Ambulatory Visit | Attending: Internal Medicine | Admitting: Internal Medicine

## 2018-07-25 DIAGNOSIS — L97423 Non-pressure chronic ulcer of left heel and midfoot with necrosis of muscle: Secondary | ICD-10-CM

## 2018-07-25 DIAGNOSIS — E11621 Type 2 diabetes mellitus with foot ulcer: Secondary | ICD-10-CM

## 2018-07-25 MED ORDER — GADOBENATE DIMEGLUMINE 529 MG/ML IV SOLN
20.0000 mL | Freq: Once | INTRAVENOUS | Status: AC | PRN
  Administered 2018-07-25: 20 mL via INTRAVENOUS

## 2018-07-26 ENCOUNTER — Other Ambulatory Visit: Payer: Self-pay

## 2018-07-26 ENCOUNTER — Inpatient Hospital Stay (HOSPITAL_COMMUNITY)
Admission: AD | Admit: 2018-07-26 | Discharge: 2018-07-29 | DRG: 638 | Disposition: A | Discharge status: Home Health | Payer: No Typology Code available for payment source | Source: Ambulatory Visit | Attending: Student in an Organized Health Care Education/Training Program | Admitting: Student in an Organized Health Care Education/Training Program

## 2018-07-26 ENCOUNTER — Ambulatory Visit: Payer: No Typology Code available for payment source | Admitting: Dietician

## 2018-07-26 ENCOUNTER — Encounter: Payer: Self-pay | Admitting: Internal Medicine

## 2018-07-26 ENCOUNTER — Encounter: Payer: Self-pay | Admitting: Dietician

## 2018-07-26 ENCOUNTER — Ambulatory Visit: Payer: No Typology Code available for payment source

## 2018-07-26 ENCOUNTER — Telehealth: Payer: Self-pay | Admitting: Internal Medicine

## 2018-07-26 ENCOUNTER — Ambulatory Visit (INDEPENDENT_AMBULATORY_CARE_PROVIDER_SITE_OTHER): Payer: No Typology Code available for payment source | Admitting: Internal Medicine

## 2018-07-26 DIAGNOSIS — L03116 Cellulitis of left lower limb: Secondary | ICD-10-CM | POA: Diagnosis present

## 2018-07-26 DIAGNOSIS — K219 Gastro-esophageal reflux disease without esophagitis: Secondary | ICD-10-CM | POA: Diagnosis present

## 2018-07-26 DIAGNOSIS — M86172 Other acute osteomyelitis, left ankle and foot: Secondary | ICD-10-CM | POA: Diagnosis present

## 2018-07-26 DIAGNOSIS — L97529 Non-pressure chronic ulcer of other part of left foot with unspecified severity: Secondary | ICD-10-CM | POA: Diagnosis present

## 2018-07-26 DIAGNOSIS — E1169 Type 2 diabetes mellitus with other specified complication: Principal | ICD-10-CM | POA: Diagnosis present

## 2018-07-26 DIAGNOSIS — Z89412 Acquired absence of left great toe: Secondary | ICD-10-CM

## 2018-07-26 DIAGNOSIS — Z89422 Acquired absence of other left toe(s): Secondary | ICD-10-CM

## 2018-07-26 DIAGNOSIS — E1165 Type 2 diabetes mellitus with hyperglycemia: Secondary | ICD-10-CM | POA: Diagnosis present

## 2018-07-26 DIAGNOSIS — E1159 Type 2 diabetes mellitus with other circulatory complications: Secondary | ICD-10-CM | POA: Diagnosis present

## 2018-07-26 DIAGNOSIS — I152 Hypertension secondary to endocrine disorders: Secondary | ICD-10-CM | POA: Diagnosis present

## 2018-07-26 DIAGNOSIS — E119 Type 2 diabetes mellitus without complications: Secondary | ICD-10-CM

## 2018-07-26 DIAGNOSIS — Z886 Allergy status to analgesic agent status: Secondary | ICD-10-CM

## 2018-07-26 DIAGNOSIS — E11621 Type 2 diabetes mellitus with foot ulcer: Secondary | ICD-10-CM | POA: Diagnosis present

## 2018-07-26 DIAGNOSIS — Z79899 Other long term (current) drug therapy: Secondary | ICD-10-CM

## 2018-07-26 DIAGNOSIS — L02612 Cutaneous abscess of left foot: Secondary | ICD-10-CM | POA: Diagnosis present

## 2018-07-26 DIAGNOSIS — E1142 Type 2 diabetes mellitus with diabetic polyneuropathy: Secondary | ICD-10-CM

## 2018-07-26 DIAGNOSIS — F40240 Claustrophobia: Secondary | ICD-10-CM | POA: Diagnosis present

## 2018-07-26 DIAGNOSIS — E1140 Type 2 diabetes mellitus with diabetic neuropathy, unspecified: Secondary | ICD-10-CM | POA: Diagnosis present

## 2018-07-26 DIAGNOSIS — Z794 Long term (current) use of insulin: Secondary | ICD-10-CM

## 2018-07-26 DIAGNOSIS — E1151 Type 2 diabetes mellitus with diabetic peripheral angiopathy without gangrene: Secondary | ICD-10-CM | POA: Diagnosis present

## 2018-07-26 DIAGNOSIS — M869 Osteomyelitis, unspecified: Secondary | ICD-10-CM | POA: Diagnosis present

## 2018-07-26 DIAGNOSIS — Z539 Procedure and treatment not carried out, unspecified reason: Secondary | ICD-10-CM | POA: Diagnosis not present

## 2018-07-26 DIAGNOSIS — Z880 Allergy status to penicillin: Secondary | ICD-10-CM

## 2018-07-26 DIAGNOSIS — J45909 Unspecified asthma, uncomplicated: Secondary | ICD-10-CM | POA: Diagnosis present

## 2018-07-26 DIAGNOSIS — I10 Essential (primary) hypertension: Secondary | ICD-10-CM | POA: Diagnosis present

## 2018-07-26 DIAGNOSIS — B951 Streptococcus, group B, as the cause of diseases classified elsewhere: Secondary | ICD-10-CM | POA: Diagnosis present

## 2018-07-26 DIAGNOSIS — M8610 Other acute osteomyelitis, unspecified site: Secondary | ICD-10-CM

## 2018-07-26 LAB — CBC WITH DIFFERENTIAL/PLATELET
Abs Immature Granulocytes: 0 10*3/uL (ref 0.0–0.1)
Basophils Absolute: 0 10*3/uL (ref 0.0–0.1)
Basophils Relative: 0 %
Eosinophils Absolute: 0.1 10*3/uL (ref 0.0–0.7)
Eosinophils Relative: 1 %
HCT: 39.4 % (ref 36.0–46.0)
Hemoglobin: 12.3 g/dL (ref 12.0–15.0)
Immature Granulocytes: 0 %
Lymphocytes Relative: 25 %
Lymphs Abs: 2.7 10*3/uL (ref 0.7–4.0)
MCH: 30.9 pg (ref 26.0–34.0)
MCHC: 31.2 g/dL (ref 30.0–36.0)
MCV: 99 fL (ref 78.0–100.0)
Monocytes Absolute: 0.5 10*3/uL (ref 0.1–1.0)
Monocytes Relative: 5 %
Neutro Abs: 7.3 10*3/uL (ref 1.7–7.7)
Neutrophils Relative %: 69 %
Platelets: 270 10*3/uL (ref 150–400)
RBC: 3.98 MIL/uL (ref 3.87–5.11)
RDW: 12.7 % (ref 11.5–15.5)
WBC: 10.7 10*3/uL — ABNORMAL HIGH (ref 4.0–10.5)

## 2018-07-26 LAB — PROTIME-INR
INR: 0.87
Prothrombin Time: 11.7 seconds (ref 11.4–15.2)

## 2018-07-26 LAB — BASIC METABOLIC PANEL
Anion gap: 9 (ref 5–15)
BUN: 21 mg/dL — ABNORMAL HIGH (ref 6–20)
CO2: 27 mmol/L (ref 22–32)
Calcium: 9.2 mg/dL (ref 8.9–10.3)
Chloride: 99 mmol/L (ref 98–111)
Creatinine, Ser: 1.05 mg/dL — ABNORMAL HIGH (ref 0.44–1.00)
GFR calc Af Amer: >60 mL/min (ref >60)
GFR calc non Af Amer: 58 mL/min — ABNORMAL LOW (ref >60)
Glucose, Bld: 386 mg/dL — ABNORMAL HIGH (ref 70–99)
Potassium: 4.1 mmol/L (ref 3.5–5.1)
Sodium: 135 mmol/L (ref 135–145)

## 2018-07-26 LAB — TYPE AND SCREEN
ABO/RH(D): B POS
Antibody Screen: NEGATIVE

## 2018-07-26 LAB — GLUCOSE, CAPILLARY: Glucose-Capillary: 383 mg/dL — ABNORMAL HIGH (ref 70–99)

## 2018-07-26 LAB — ABO/RH: ABO/RH(D): B POS

## 2018-07-26 MED ORDER — ATORVASTATIN CALCIUM 40 MG PO TABS
40.0000 mg | ORAL_TABLET | Freq: Every day | ORAL | Status: DC
  Administered 2018-07-26 – 2018-07-29 (×4): 40 mg via ORAL
  Filled 2018-07-26 (×4): qty 1

## 2018-07-26 MED ORDER — ACETAMINOPHEN 500 MG PO TABS
1000.0000 mg | ORAL_TABLET | Freq: Four times a day (QID) | ORAL | Status: DC | PRN
  Administered 2018-07-26 – 2018-07-29 (×2): 1000 mg via ORAL
  Filled 2018-07-26 (×4): qty 2

## 2018-07-26 MED ORDER — ENOXAPARIN SODIUM 40 MG/0.4ML ~~LOC~~ SOLN
40.0000 mg | SUBCUTANEOUS | Status: DC
  Administered 2018-07-26 – 2018-07-28 (×3): 40 mg via SUBCUTANEOUS
  Filled 2018-07-26 (×3): qty 0.4

## 2018-07-26 MED ORDER — RAMELTEON 8 MG PO TABS
8.0000 mg | ORAL_TABLET | Freq: Once | ORAL | Status: AC
  Administered 2018-07-26: 8 mg via ORAL
  Filled 2018-07-26: qty 1

## 2018-07-26 MED ORDER — PANTOPRAZOLE SODIUM 40 MG PO TBEC
40.0000 mg | DELAYED_RELEASE_TABLET | Freq: Every day | ORAL | Status: DC
  Administered 2018-07-26 – 2018-07-29 (×4): 40 mg via ORAL
  Filled 2018-07-26 (×4): qty 1

## 2018-07-26 MED ORDER — GABAPENTIN 300 MG PO CAPS
300.0000 mg | ORAL_CAPSULE | Freq: Three times a day (TID) | ORAL | Status: DC
  Administered 2018-07-26 – 2018-07-29 (×8): 300 mg via ORAL
  Filled 2018-07-26 (×8): qty 1

## 2018-07-26 MED ORDER — INSULIN GLARGINE 100 UNIT/ML ~~LOC~~ SOLN
10.0000 [IU] | Freq: Every day | SUBCUTANEOUS | Status: DC
  Administered 2018-07-26 – 2018-07-27 (×2): 10 [IU] via SUBCUTANEOUS
  Filled 2018-07-26 (×4): qty 0.1

## 2018-07-26 NOTE — H&P (Addendum)
Date: 07/26/2018               Patient Name:  [name] MRN: [id number]  DOB: [birth year] Age / Sex: 57 y.o., female   PCP: Katherine Roan, MD         Medical Service: Internal Medicine Teaching Service         Attending Physician: Dr. Evette Doffing, Mallie Mussel, *    First Contact: Dr. Donne Hazel Pager: [phone]  Second Contact: Dr. Hetty Ely Pager: [phone]       After Hours (After 5p/  First Contact Pager: [phone]  weekends / holidays): Second Contact Pager: [phone]   Chief Complaint: foot ulcer, osteomyelitis   History of Present Illness:  DebraRaymie [name] is a 57 y.o. African-American woman with uncontrolled type 2 DM complicated by peripheral arterial disease and neuropathy. She presents from clinic with acute osteomyelitis of the left foot found on MRI yesterday. She has a history of prior amputations of left great toe in 2013 and left 2nd toe in 2015. She reports a 3 month history of oozing ulcer on the left midfoot. She also notes swelling of the left leg. She was seen in clinic 2.5 weeks ago and suspicions for osteomyelitis were low at that time. They ordered a stat MRI but patient was unable to complete it due to claustrophobia. She self prescribed a 10 day course of an unknown antibiotic that she had at home and reports that helped with the swelling. Her last dose was 5 days ago. At her next clinic appointment they ordered an open MRI which showed evidence of acute osteomyelitis of the second metatarsal head.  She denies any pain near the ulcer but reports a 2 day history of throbbing pain from the dorsum of her feet, bilaterally, up to her knees. She states this pain is worsened by standing and relieved with rest. She does not think this pain is related to the ulcer on her left foot.   Her last A1c one month ago was 11.5. She reports adherence with metformin and 70/30 insulin but states that her sugars have been "all over the place, 140-300s" and that's how she knew she had an  infection. She states she never gets fevers or chills with infections. She also reports feeling "ichy" today. She has several pairs of the same type of tennis shoes that she wears all the time. She does not have orthotics.   ROS negative for fever, chills, nausea, vomiting, foot pain, joint aches, confusion, headache.   Meds:  Current Meds  Medication Sig  . acetaminophen (TYLENOL) 650 MG CR tablet Take 1,950 mg by mouth every 8 (eight) hours as needed for pain.  Marland Kitchen atorvastatin (LIPITOR) 40 MG tablet Take 1 tablet (40 mg total) by mouth daily.  Marland Kitchen exenatide (BYETTA 10 MCG PEN) 10 MCG/0.04ML SOPN injection Inject 0.04 mLs (10 mcg total) into the skin 2 (two) times daily with a meal.  . gabapentin (NEURONTIN) 300 MG capsule Take 1 capsule (300 mg total) by mouth 3 (three) times daily.  . insulin aspart protamine - aspart (NOVOLOG MIX 70/30 FLEXPEN) (70-30) 100 UNIT/ML FlexPen Inject 0.55 mLs (55 Units total) into the skin 2 (two) times daily with a meal. IM program (Patient taking differently: Inject 55 Units into the skin 2 (two) times daily with a meal. )  . lisinopril (PRINIVIL,ZESTRIL) 5 MG tablet Take 1 tablet (5 mg total) by mouth at bedtime. IM PROGRAM (Patient taking differently: Take 10 mg by mouth at  bedtime. )  . metFORMIN (GLUCOPHAGE) 1000 MG tablet TAKE 1 Tablet  BY MOUTH TWICE DAILY WITH A MEAL (Patient taking differently: Take 1,000 mg by mouth 2 (two) times daily with a meal. )  . Multiple Vitamin (MULTIVITAMIN WITH MINERALS) TABS tablet Take 1 tablet by mouth daily as needed.   Marland Kitchen omeprazole (PRILOSEC) 20 MG capsule Take 1 capsule (20 mg total) by mouth daily.    Allergies: Allergies as of 07/26/2018 - Review Complete 07/26/2018  Allergen Reaction Noted  . Aspirin Nausea And Vomiting 12/03/2010  . Ibuprofen Nausea And Vomiting and Other (See Comments) 11/01/2010  . Penicillins Other (See Comments) 11/01/2010   Past Medical History:  Diagnosis Date  . Anemia   . Asthma  11/01/2010  . Diabetes mellitus    since 1983  . GERD (gastroesophageal reflux disease)   . Hypertension   . Osteomyelitis ([place])   . Pneumonia    as a child    Family History: HTN, dementia - mom; Diabetes HTN - dad  Social History: Works as a Scientist, water quality. Has 4 children, youngest of which is 89. She lives in [place] by herself. Her children are nearby. Denies tobacco, alcohol, or other substance use.   Review of Systems: A complete ROS was negative except as per HPI.   Physical Exam: Blood pressure 130/70, pulse 92, temperature 98.2 F (36.8 C), temperature source Oral, resp. rate 14, height 6\' 1"  (1.854 m), weight 129.5 kg, last menstrual period 06/23/2014, SpO2 100 %. Vitals:   07/26/18 1725  BP: 130/70  Pulse: 92  Resp: 14  Temp: 98.2 F (36.8 C)  TempSrc: Oral  SpO2: 100%  Weight: 129.5 kg  Height: 6\' 1"  (1.854 m)   General: Vital signs reviewed.  Patient is well-developed and well-nourished, in no acute distress and cooperative with exam.  Head: Normocephalic and atraumatic. Eyes: EOMI, conjunctivae normal, no scleral icterus.  Neck: Supple, trachea midline, normal ROM Cardiovascular: RRR, S1 normal, S2 normal, no murmurs, gallops, or rubs. Pulmonary/Chest: Clear to auscultation bilaterally, no wheezes, rales, or rhonchi. Abdominal: Soft, non-tender, non-distended  Extremities: left lower extremity is warm with 1+ nonpitting edema from foot to calf, without tenderness or erythema. DP pulse strong and symmetric. No swelling of the right lowe extremity. Left great and 2nd toes amputated. 3x3cm ulcer on plantar surface of left forefoot with central opening and foul odor. No drainage, crepitus, or tenderness to palpation.  Neurological: A&O x3, decreased sensation of left lower extremity  Psychiatric: Normal mood and affect. speech and behavior is normal. Cognition and memory are normal.    MRI left foot:  1. Plantar soft tissue ulcer with enhancing subcutaneous soft  tissue abscess along the plantar aspect of the second metatarsal head and neck. The enhancing fluid measures approximately 10 x 7 x 4 mm. 2. Abnormal marrow signal abnormality of the second metatarsal head and neck compatible with acute osteomyelitis. 3. Status post amputation of the great and second toe at the level of the MTP articulations. 4. Forefoot cellulitis.   Assessment & Plan by Problem: Principal Problem:   Osteomyelitis ([place]) Active Problems:   Hypertension associated with diabetes ([place])   Diabetes ([place])  DebraAnetta [name] is a 57 y.o. African-American woman with uncontrolled type 2 DM complicated by peripheral arterial disease, neuropathy, and previous amputations of left great and second toes who presents with 3 months of worsening ulcer on left plantar forefoot and MRI findings consistent with acute osteo of left second metatarsal head.  Acute Osteomyelitis: hemodynamically  stable.  Caused by a neuropathic ulcer at the plantar surface which tracks down to the bone.  Sepsis ruled out, doing well with minimal systemic signs of infection.  She is at high risk for eventually requiring amputation of the left foot.  Seems to have minimal peripheral artery disease, last ABI 2 years ago 1.1 left foot. - consult ortho for bone biopsy and cultures - NPO at midnight for biopsy tomorrow - will start vanc/cefepime after biopsy  - prn tylenol   Diabetes, type II: last a1c 11.5. Home regiment includes metformin 1000mg , 70/30 55U BID,  - continue home gabapentin - Lantus 10U  HTN: continue home lisinopril, statin  Dispo: Admit patient to Inpatient with expected length of stay greater than 2 midnights.  Signed: Isabelle Course, MD 07/26/2018, 6:56 PM  Pager: [phone]    Internal Medicine Attending:   I saw and examined the patient. I reviewed the resident's note and I agree with the resident's findings and plan as documented in the resident's note.  I made necessary changes to  the above note.  Lalla Brothers MD

## 2018-07-26 NOTE — Progress Notes (Signed)
Pt arrived to room 5N12. Pt oriented to room and staff. Call bell within reach. All questions answered to satisfaction. Received report from Ander Purpura, [place]. See assessment. Will continue to monitor.

## 2018-07-26 NOTE — Progress Notes (Signed)
Patient refused to sign consent form, stating that she wants to talk to the orthopedic surgeon before they do any surgery.

## 2018-07-26 NOTE — Telephone Encounter (Signed)
   Reason for call:   I received a call from [facility] indicating that they had completed the patients MRI of the foot w/ findings concerning for osteomyelitis and cellulitis.    Pertinent Data:   Following a brief chart review, it appears as if this has been an ongoing process due to MRI compatibility issues. She has a history of prior lower digit amputation of the same foot due to her long term moderately well controlled diabetes and secondary peripheral arterial and neuropathic sequela.   The patient was notified of her MRI findings. She denied fever, chills, nausea, vomiting, foot pain, joint aches, notable edema of the foot, confusion, headache, or other concerns.    Assessment / Plan / Recommendations:   Patient will need urgent evaluation given the MRI findings and likely surgical debridement and antibiotics. She was advised that she should likely visit the ED overnight but stated that as she was to visit the [facility] the afternoon of 08/29, she would prefer to wait and be seen by a provider then as she "felt" fine. Given her preference, I advised her to call the [facility] ASAP in the morning to attempt to schedule an appointment that day while she is being seen by the nutritionist. If this is not possible she may need to be seen in the ED for evaluation. I will attempt to notify the [facility] in order to secure her an appointment for the 29th of August if possible as this would most ideally benefit her.   As always, pt is advised that if she were to develop symptoms such as those denied above, that she should go to the ER for further evaluation.   [name], MD   07/26/2018, 1:45 AM

## 2018-07-26 NOTE — Plan of Care (Signed)
  Problem: Education: Goal: Knowledge of General Education information will improve Description Including pain rating scale, medication(s)/side effects and non-pharmacologic comfort measures Outcome: Progressing   Problem: Clinical Measurements: Goal: Respiratory complications will improve Outcome: Progressing   Problem: Activity: Goal: Risk for activity intolerance will decrease Outcome: Progressing   Problem: Nutrition: Goal: Adequate nutrition will be maintained Outcome: Progressing   Problem: Coping: Goal: Level of anxiety will decrease Outcome: Progressing   Problem: Pain Managment: Goal: General experience of comfort will improve Outcome: Progressing   Problem: Safety: Goal: Ability to remain free from injury will improve Outcome: Progressing   

## 2018-07-26 NOTE — Progress Notes (Signed)
RN paged IM Donne Hazel about pt being on unit and needing orders awaiting callback

## 2018-07-26 NOTE — Progress Notes (Signed)
   CC: L foot ulcer   HPI:  [name] is a 57 y.o. female with PMH listed below who presents to clinic for follow up of L foot ulcer.   Diabetic foot ulcer: [name] has a history of uncontrolled (A1c 11.5 05/2018), insulin-dependent U1LK complicated by peripheral neuropathy s/p L 1st and 2nd amputations. She presents today for follow up of L foot ulcer that has been present for 3 weeks after receiving a call that her L foot MRI showed acute osteomyelitis of her 2nd metatarsal head and neck, an abscess along the plantar 2nd metatarsal that measures 10 x 7 x 4 mm, and forefoot cellulitis. She denies systemic symptoms or pain on ambulation. Per last note, she has been taking antibiotics at home, but unclear which one. She did not bring the bottle today. Plan to admit for IV antibiotic therapy and orthopedic surgery consultation.   Past Medical History:  Diagnosis Date  . Anemia   . Asthma 11/01/2010  . Diabetes mellitus    since 1983  . GERD (gastroesophageal reflux disease)   . Hypertension   . Osteomyelitis ([place])   . Pneumonia    as a child   Review of Systems:   Review of Systems  Constitutional: Negative for chills, fever and malaise/fatigue.  Musculoskeletal: Positive for myalgias. Negative for joint pain.  Neurological: Negative for dizziness and headaches.   Physical Exam: Vitals:   07/26/18 1532  BP: 137/76  Pulse: (!) 101  Temp: 98.6 F (37 C)  TempSrc: Oral  SpO2: 100%  Weight: 286 lb 4.8 oz (129.9 kg)   General: young female, well-appearing, in no acute distress  Cv: RRR, nl S1/S2, no mrg  Ext: L foot with 2.5x3 cm ulcer with mild surrounding erythema and midl serous discharge. No purulence noted. Foot is warm to the touch and with non-pitting edema which is chronic and at baseline.   Assessment & Plan:   See Encounters Tab for problem based charting.  Patient discussed with Dr. Rebeca Alert

## 2018-07-26 NOTE — Progress Notes (Signed)
Patient asked to defer retinal images to another time.

## 2018-07-26 NOTE — Plan of Care (Signed)

## 2018-07-26 NOTE — Patient Instructions (Signed)
[name],   When you return to the hospital please go to the main entrance and they will let you know which room has been assigned to you.  You can proceed straight to your room and internal medicine team will meet you there.  If you have any questions please give [name] a call.  - Dr. Frederico Hamman

## 2018-07-27 ENCOUNTER — Encounter (HOSPITAL_COMMUNITY)
Admission: AD | Disposition: A | Discharge status: Home Health | Payer: Self-pay | Source: Ambulatory Visit | Attending: Student in an Organized Health Care Education/Training Program

## 2018-07-27 ENCOUNTER — Encounter: Payer: Self-pay | Admitting: Internal Medicine

## 2018-07-27 ENCOUNTER — Other Ambulatory Visit: Payer: Self-pay

## 2018-07-27 ENCOUNTER — Inpatient Hospital Stay: Payer: Self-pay

## 2018-07-27 ENCOUNTER — Encounter (HOSPITAL_COMMUNITY): Payer: Self-pay | Admitting: General Practice

## 2018-07-27 DIAGNOSIS — Z833 Family history of diabetes mellitus: Secondary | ICD-10-CM

## 2018-07-27 DIAGNOSIS — R21 Rash and other nonspecific skin eruption: Secondary | ICD-10-CM

## 2018-07-27 DIAGNOSIS — E1169 Type 2 diabetes mellitus with other specified complication: Principal | ICD-10-CM

## 2018-07-27 DIAGNOSIS — M86672 Other chronic osteomyelitis, left ankle and foot: Secondary | ICD-10-CM

## 2018-07-27 DIAGNOSIS — Z872 Personal history of diseases of the skin and subcutaneous tissue: Secondary | ICD-10-CM

## 2018-07-27 DIAGNOSIS — E1142 Type 2 diabetes mellitus with diabetic polyneuropathy: Secondary | ICD-10-CM

## 2018-07-27 DIAGNOSIS — Z880 Allergy status to penicillin: Secondary | ICD-10-CM

## 2018-07-27 DIAGNOSIS — L02612 Cutaneous abscess of left foot: Secondary | ICD-10-CM

## 2018-07-27 DIAGNOSIS — E1151 Type 2 diabetes mellitus with diabetic peripheral angiopathy without gangrene: Secondary | ICD-10-CM

## 2018-07-27 DIAGNOSIS — I10 Essential (primary) hypertension: Secondary | ICD-10-CM

## 2018-07-27 DIAGNOSIS — Z794 Long term (current) use of insulin: Secondary | ICD-10-CM

## 2018-07-27 DIAGNOSIS — E1159 Type 2 diabetes mellitus with other circulatory complications: Secondary | ICD-10-CM

## 2018-07-27 DIAGNOSIS — M86272 Subacute osteomyelitis, left ankle and foot: Secondary | ICD-10-CM

## 2018-07-27 DIAGNOSIS — Z886 Allergy status to analgesic agent status: Secondary | ICD-10-CM

## 2018-07-27 DIAGNOSIS — Z8619 Personal history of other infectious and parasitic diseases: Secondary | ICD-10-CM

## 2018-07-27 DIAGNOSIS — Z89412 Acquired absence of left great toe: Secondary | ICD-10-CM

## 2018-07-27 DIAGNOSIS — E1140 Type 2 diabetes mellitus with diabetic neuropathy, unspecified: Secondary | ICD-10-CM

## 2018-07-27 DIAGNOSIS — M86172 Other acute osteomyelitis, left ankle and foot: Secondary | ICD-10-CM

## 2018-07-27 DIAGNOSIS — Z89422 Acquired absence of other left toe(s): Secondary | ICD-10-CM

## 2018-07-27 DIAGNOSIS — Z79899 Other long term (current) drug therapy: Secondary | ICD-10-CM

## 2018-07-27 LAB — BASIC METABOLIC PANEL
Anion gap: 8 (ref 5–15)
BUN: 9 mg/dL (ref 6–20)
CALCIUM: 9.3 mg/dL (ref 8.9–10.3)
CHLORIDE: 99 mmol/L (ref 98–111)
CO2: 29 mmol/L (ref 22–32)
Creatinine, Ser: 0.78 mg/dL (ref 0.44–1.00)
Glucose, Bld: 325 mg/dL — ABNORMAL HIGH (ref 70–99)
Potassium: 4.2 mmol/L (ref 3.5–5.1)
SODIUM: 136 mmol/L (ref 135–145)

## 2018-07-27 LAB — GLUCOSE, CAPILLARY
GLUCOSE-CAPILLARY: 264 mg/dL — AB (ref 70–99)
GLUCOSE-CAPILLARY: 362 mg/dL — AB (ref 70–99)
GLUCOSE-CAPILLARY: 261 mg/dL — AB (ref 70–99)
Glucose-Capillary: 311 mg/dL — ABNORMAL HIGH (ref 70–99)

## 2018-07-27 LAB — CBC
HEMATOCRIT: 40.4 % (ref 36.0–46.0)
Hemoglobin: 12.6 g/dL (ref 12.0–15.0)
MCH: 30.5 pg (ref 26.0–34.0)
MCHC: 31.2 g/dL (ref 30.0–36.0)
MCV: 97.8 fL (ref 78.0–100.0)
Platelets: 230 10*3/uL (ref 150–400)
RBC: 4.13 MIL/uL (ref 3.87–5.11)
RDW: 12.6 % (ref 11.5–15.5)
WBC: 5.3 10*3/uL (ref 4.0–10.5)

## 2018-07-27 LAB — SEDIMENTATION RATE: Sed Rate: 63 mm/hr — ABNORMAL HIGH (ref 0–22)

## 2018-07-27 LAB — SURGICAL PCR SCREEN
MRSA, PCR: NEGATIVE
Staphylococcus aureus: NEGATIVE

## 2018-07-27 LAB — HIV ANTIBODY (ROUTINE TESTING W REFLEX): HIV Screen 4th Generation wRfx: NONREACTIVE

## 2018-07-27 LAB — C-REACTIVE PROTEIN: CRP: 13.3 mg/dL — ABNORMAL HIGH (ref <1.0)

## 2018-07-27 SURGERY — AMPUTATION, FOOT, PARTIAL
Anesthesia: Choice | Laterality: Left

## 2018-07-27 MED ORDER — INSULIN GLARGINE 100 UNIT/ML ~~LOC~~ SOLN
45.0000 [IU] | Freq: Every day | SUBCUTANEOUS | Status: DC
  Administered 2018-07-28: 45 [IU] via SUBCUTANEOUS
  Filled 2018-07-27: qty 0.45

## 2018-07-27 MED ORDER — METHOCARBAMOL 500 MG PO TABS
1000.0000 mg | ORAL_TABLET | Freq: Four times a day (QID) | ORAL | Status: DC | PRN
  Administered 2018-07-27 – 2018-07-28 (×5): 1000 mg via ORAL
  Filled 2018-07-27 (×5): qty 2

## 2018-07-27 MED ORDER — INSULIN ASPART 100 UNIT/ML ~~LOC~~ SOLN
6.0000 [IU] | Freq: Three times a day (TID) | SUBCUTANEOUS | Status: DC
  Administered 2018-07-27 – 2018-07-28 (×4): 6 [IU] via SUBCUTANEOUS

## 2018-07-27 MED ORDER — INSULIN GLARGINE 100 UNIT/ML ~~LOC~~ SOLN
20.0000 [IU] | Freq: Once | SUBCUTANEOUS | Status: AC
  Administered 2018-07-27: 20 [IU] via SUBCUTANEOUS
  Filled 2018-07-27: qty 0.2

## 2018-07-27 MED ORDER — INSULIN ASPART 100 UNIT/ML ~~LOC~~ SOLN
0.0000 [IU] | Freq: Three times a day (TID) | SUBCUTANEOUS | Status: DC
  Administered 2018-07-27: 7 [IU] via SUBCUTANEOUS
  Administered 2018-07-27 – 2018-07-28 (×2): 9 [IU] via SUBCUTANEOUS
  Administered 2018-07-28: 7 [IU] via SUBCUTANEOUS

## 2018-07-27 MED ORDER — SODIUM CHLORIDE 0.9 % IV SOLN
1.0000 g | INTRAVENOUS | Status: DC
  Administered 2018-07-27 – 2018-07-29 (×3): 1000 mg via INTRAVENOUS
  Filled 2018-07-27 (×3): qty 1

## 2018-07-27 NOTE — Consult Note (Signed)
ORTHOPAEDIC CONSULTATION  REQUESTING PHYSICIAN: Axel Filler, *  Chief Complaint: Infection purulent drainage left foot.  HPI: [name] is a 57 y.o. female who presents with osteomyelitis second metatarsal with purulent draining ulcer beneath the second metatarsal head.  Patient has uncontrolled type 2 diabetes who is status post amputation of the great toe and second toe left foot.  Patient is concerned that her primary care physician did not place her on an antibiotic to treat her foot infection.  Past Medical History:  Diagnosis Date  . Anemia   . Asthma 11/01/2010  . Diabetes mellitus    since 1983  . GERD (gastroesophageal reflux disease)   . Hypertension   . Osteomyelitis ([place])   . Pneumonia    as a child   Past Surgical History:  Procedure Laterality Date  . ACHILLES TENDON LENGTHENING Left 08/29/2014   Procedure: Left Achilles Lengthening;  Surgeon: Newt Minion, MD;  Location: [facility];  Service: Orthopedics;  Laterality: Left;  . AMPUTATION Left 02/19/2013   Procedure: Amputation of Left Great Toe;  Surgeon: Mcarthur Rossetti, MD;  Location: [facility];  Service: Orthopedics;  Laterality: Left;  . AMPUTATION Left 08/29/2014   Procedure: Left Foot 2nd and 1st Toe Amputation;  Surgeon: Newt Minion, MD;  Location: [facility];  Service: Orthopedics;  Laterality: Left;  . CESAREAN SECTION     x 4  . TUBAL LIGATION     Social History   Socioeconomic History  . Marital status: Legally Separated    Spouse name: Not on file  . Number of children: Not on file  . Years of education: Not on file  . Highest education level: Not on file  Occupational History  . Not on file  Social Needs  . Financial resource strain: Not on file  . Food insecurity:    Worry: Not on file    Inability: Not on file  . Transportation needs:    Medical: Not on file    Non-medical: Not on file  Tobacco Use  . Smoking status: Never Smoker  . Smokeless tobacco: Never Used    Substance and Sexual Activity  . Alcohol use: No    Alcohol/week: 0.0 standard drinks  . Drug use: No  . Sexual activity: Not on file  Lifestyle  . Physical activity:    Days per week: Not on file    Minutes per session: Not on file  . Stress: Not on file  Relationships  . Social connections:    Talks on phone: Not on file    Gets together: Not on file    Attends religious service: Not on file    Active member of club or organization: Not on file    Attends meetings of clubs or organizations: Not on file    Relationship status: Not on file  Other Topics Concern  . Not on file  Social History Narrative  . Not on file   Family History  Problem Relation Age of Onset  . Diabetes Father   . Hypertension Father   . Hypertension Mother   . Dementia Mother   . Diabetes Daughter    - negative except otherwise stated in the family history section Allergies  Allergen Reactions  . Aspirin Nausea And Vomiting  . Ibuprofen Nausea And Vomiting and Other (See Comments)    Abdominal pain  . Penicillins Other (See Comments)    unknown   Prior to Admission medications   Medication Sig Start  Date End Date Taking? Authorizing Provider  atorvastatin (LIPITOR) 40 MG tablet Take 1 tablet (40 mg total) by mouth daily. 02/09/18   Jule Ser, DO  blood glucose meter kit and supplies KIT Dispense based on patient and insurance preference. Use up to four times daily as directed. (FOR ICD-9 250.00, 250.01). 07/09/18   Jean Rosenthal, MD  blood glucose meter kit and supplies Dispense based on patient and insurance preference. Use up to four times daily as directed. 10/09/17   [name], MD  Blood Glucose Monitoring Suppl (TRUE METRIX METER) DEVI 1 Device by Does not apply route 2 (two) times daily. 12/07/16   Holley Raring, MD  exenatide (BYETTA 10 MCG PEN) 10 MCG/0.04ML SOPN injection Inject 0.04 mLs (10 mcg total) into the skin 2 (two) times daily with a meal. 07/09/18   Agyei, Caprice Kluver,  MD  gabapentin (NEURONTIN) 300 MG capsule Take 1 capsule (300 mg total) by mouth 3 (three) times daily. 07/09/18   Jean Rosenthal, MD  glucose blood test strip Use as instructed 10/09/17   [name], MD  insulin aspart protamine - aspart (NOVOLOG MIX 70/30 FLEXPEN) (70-30) 100 UNIT/ML FlexPen Inject 0.55 mLs (55 Units total) into the skin 2 (two) times daily with a meal. IM program 07/09/18   Jean Rosenthal, MD  Insulin Pen Needle 29G X 5MM MISC Inject under the skin as prescribed. 10/09/17   [name], MD  Lancet Devices (AUTO-LANCET) MISC Prick finger for BG check as prescribed. 10/09/17   [name], MD  Lancets MISC 1 each by Does not apply route 4 (four) times daily -  before meals and at bedtime. 09/30/15   Burns, Arloa Koh, MD  lisinopril (PRINIVIL,ZESTRIL) 5 MG tablet Take 1 tablet (5 mg total) by mouth at bedtime. IM PROGRAM 06/27/18 09/25/18  Katherine Roan, MD  metFORMIN (GLUCOPHAGE) 1000 MG tablet TAKE 1 Tablet  BY MOUTH TWICE DAILY WITH A MEAL 02/09/18   Jule Ser, DO  Multiple Vitamin (MULTIVITAMIN WITH MINERALS) TABS tablet Take 1 tablet by mouth daily.    [provider]  omeprazole (PRILOSEC) 20 MG capsule Take 1 capsule (20 mg total) by mouth daily. 02/09/18   Jule Ser, DO   Mr Foot Left W Wo Contrast  Result Date: 07/25/2018 CLINICAL DATA:  57 year old female with foot swelling and history of diabetes. History of left great and second toe amputation in 2013. Patient nodes losing ulcer in the left midfoot to the toes since May, 2019. EXAM: MRI OF THE LEFT FOREFOOT WITHOUT AND WITH CONTRAST TECHNIQUE: Multiplanar, multisequence MR imaging of the left foot was performed both before and after administration of intravenous contrast. CONTRAST:  64m MULTIHANCE GADOBENATE DIMEGLUMINE 529 MG/ML IV SOLN COMPARISON:  Radiographs from 06/25/2018, MRI 02/18/2013 FINDINGS: Bones/Joint/Cartilage Abnormal marrow edema involving the second metatarsal head  and neck with adjacent plantar soft tissue ulcer and trace enhancing fluid along the plantar aspect of the second metatarsal head measuring 10 x 7 x 4 mm is identified, series 13/13 and series 14/25. Great and second toe amputation is identified at the level of the MTP articulations. The remaining digits are intact without marrow signal abnormality, fracture nor aggressive osseous lesions. The included distal tibia and fibula are unremarkable. The talus, calcaneus and midfoot are intact without marrow signal abnormalities. Ligaments Noncontributory Muscles and Tendons No evidence of pyomyositis. The remaining tendons crossing the foot appear intact. Soft tissues Diffuse soft tissue edema of the forefoot more so overlying the first  and second metatarsals. IMPRESSION: 1. Plantar soft tissue ulcer with enhancing subcutaneous soft tissue abscess along the plantar aspect of the second metatarsal head and neck. The enhancing fluid measures approximately 10 x 7 x 4 mm. 2. Abnormal marrow signal abnormality of the second metatarsal head and neck compatible with acute osteomyelitis. 3. Status post amputation of the great and second toe at the level of the MTP articulations. 4. Forefoot cellulitis. These results will be called to the ordering clinician or representative by the Radiologist Assistant, and communication documented in the PACS or zVision Dashboard. Electronically Signed   By: Ashley Royalty M.D.   On: 07/25/2018 19:20   - pertinent xrays, CT, MRI studies were reviewed and independently interpreted  Positive ROS: All other systems have been reviewed and were otherwise negative with the exception of those mentioned in the HPI and as above.  Physical Exam: General: Alert, no acute distress Psychiatric: Patient is competent for consent with normal mood and affect Lymphatic: No axillary or cervical lymphadenopathy Cardiovascular: No pedal edema Respiratory: No cyanosis, no use of accessory musculature GI: No  organomegaly, abdomen is soft and non-tender    Images:  '@ENCIMAGES' @  Labs:  Lab Results  Component Value Date   HGBA1C 11.5 (A) 06/25/2018   HGBA1C 7.9 02/09/2018   HGBA1C 8.5 10/09/2017   ESRSEDRATE 31 06/25/2018   ESRSEDRATE 73 (H) 09/30/2010   CRP 7 06/25/2018   REPTSTATUS 06/20/2018 FINAL 06/19/2018   GRAMSTAIN  02/01/2018    FEW WBC PRESENT,BOTH PMN AND MONONUCLEAR RARE GRAM POSITIVE COCCI    CULT MULTIPLE SPECIES PRESENT, SUGGEST RECOLLECTION (A) 06/19/2018   LABORGA CITROBACTER KOSERI 08/08/2012    Lab Results  Component Value Date   ALBUMIN 3.9 08/29/2014   ALBUMIN 3.6 02/20/2014   ALBUMIN 3.4 (L) 02/17/2013    Neurologic: Patient does not have protective sensation bilateral lower extremities.   MUSCULOSKELETAL:   Skin: Examination patient has warmth redness and swelling of the left foot.  She has a Waggoner grade 3 ulcer beneath the second metatarsal head that goes down to bone.  Patient has a good dorsalis pedis pulse she has no tenderness to palpation in the leg no crepitation.  Review of the MRI scan shows osteomyelitis of the second metatarsal head.  Assessment: Assessment: Uncontrolled type 2 diabetes with osteomyelitis of the second metatarsal left foot with a Wagener grade 3 ulcer with purulent draining abscess.  Plan: Plan: Discussed with patient recommendation to proceed with surgical intervention, this ideally would be a transmetatarsal amputation.  Patient states that she does not want to proceed with surgery until all nonsurgical options have been exhausted.  Patient wants to proceed with IV antibiotics and see if this can resolve with IV antibiotics.  I discussed with her that the IV antibiotics would not resolve the osteomyelitis or the abscess.  Patient states that she would not have been in this condition if she was placed on oral antibiotics previously.  Patient most likely needs a PICC line or IV consult team for IV antibiotics I will  follow-up as needed, surgery canceled today.  Discussed that patient is at risk of the infection getting worse with only antibiotic treatment.  Thank you for the consult and the opportunity to see Ms. Alena Bills, [place] [phone] 6:44 AM

## 2018-07-27 NOTE — Progress Notes (Addendum)
Inpatient Diabetes Program Recommendations  AACE/ADA: New Consensus Statement on Inpatient Glycemic Control (2015)  Target Ranges:  Prepandial:   less than 140 mg/dL      Peak postprandial:   less than 180 mg/dL (1-2 hours)      Critically ill patients:  140 - 180 mg/dL   Lab Results  Component Value Date   GLUCAP 264 (H) 07/27/2018   HGBA1C 11.5 (A) 06/25/2018    Review of Glycemic Control Results for SHALOM, WARE (MRN [id number]) as of 07/27/2018 11:47  Ref. Range 07/26/2018 21:02 07/27/2018 08:01  Glucose-Capillary Latest Ref Range: 70 - 99 mg/dL 383 (H) 264 (H)   Diabetes history: Type 2 DM Outpatient Diabetes medications: Byetta 10 mcg BID, Novolog 70/30 55 units BID, Metformin 1000 mg BID Current orders for Inpatient glycemic control: Lantus 10 units QD  Inpatient Diabetes Program Recommendations:    Just noted order adjustments to include: Novolog 0-9 units TID, Novolog 6 units TID, and Lantus 45 units QD. In agreement and following.   Addendum @1440 : Spoke with patient regarding outpatient management of DM. Patient verbalizes taking insulin as prescribed. Feels, that since she has had her foot infection, her blood sugars have "been all over the place." Patient is open to trying basal/bolus therapy at discharge, instead of Novolog 70/30 in an attempt to improve glycemic control.   Reviewed patient's current A1c of 11.5%. Explained what a A1c is and what it measures. Also reviewed goal A1c with patient, importance of good glucose control @ home, and blood sugar goals. Reviewed patho of DM, need for insulin, increased resistance, impact of infection to blood sugars, long term comorbidites, and vascular changes.   Patient verbalizes checking blood sugars and has supplies 2-3 times per day. Encouraged to continue to check and follow up with primary care. Educated on role of endocrinology and encouraged to find one locally to help manage. Will place consult for case management- as  patient voices concerns regarding financial resources as she may be out of work. Patient denies drinking sugary beverages, excessive intake of carbohydrates, and avoids sweets. Patient has no further questions regarding diabetes.  Thanks, Bronson Curb, MSN, RNC-OB Diabetes Coordinator [phone] (8a-5p)

## 2018-07-27 NOTE — Progress Notes (Signed)
Per report, daily kerlix and gauze drsg to L foot changed. Scant drainage. Site cleansed with saline prior to wrapping with dry gauze and kerlix. Pt tolerated well. Will continue to monitor.

## 2018-07-27 NOTE — Consult Note (Signed)
[name] for Infectious Disease    Date of Admission:  07/26/2018   Total days of antibiotics 0               Reason for Consult: left foot wound    Referring Provider: Evette Doffing, MD Primary Care Provider: Shan Levans, MD  Assessment: [name] is a 57 yo female with uncontrolled diabeter (A1C 11.5) and a history of left foot amputations who was admitted for a left foot infection complicated by osteomyelitis of the second metatarsal head and neck as well as a subcutaneous abscess on the plantar aspect of the second metatarsal. Most diabetic foot infections are polymicrobial and she has MRSA risk factor of prior antibiotic use (cephalexin and doxycycline) in the past year. However, she has not grown MRSA in the past. I would like to obtain a MRSA nasal PCR, as a positive result would increase her risk of developing MRSA infection elsewhere. I would recommend starting IV Ertapenem today to cover gram negatives and anaerobes. Then, if the MRSA nasal PCR is positive, my next step would be to start Vancomycin and will make this decision tomorrow. [name] would like to proceed with parenteral antibiotic therapy.    Plan: 1. Start Ertapenem IV today 2. Order nasal MRSA PCR 3. Will decide on MRSA coverage tomorrow, after MRSA PCR results   Principal Problem:   Osteomyelitis ([place]) Active Problems:   Hypertension associated with diabetes ([place])   Diabetes ([place])   Scheduled Meds: . atorvastatin  40 mg Oral Daily  . enoxaparin (LOVENOX) injection  40 mg Subcutaneous Q24H  . gabapentin  300 mg Oral TID  . insulin aspart  0-9 Units Subcutaneous TID WC  . insulin aspart  6 Units Subcutaneous TID WC  . [START ON 07/28/2018] insulin glargine  45 Units Subcutaneous Daily  . pantoprazole  40 mg Oral Daily   Continuous Infusions: PRN Meds:.acetaminophen, methocarbamol  HPI: [name] is a 57 y.o. 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 She reports the ulcer started in May 2019 and was draining fluid and worsened in the past month. Her foot became swollen, hot, and painful but she denied having any fevers, chills, or frequent sweats. She was seen in the resident clinic early August this year and was ordered an MRI but did not receive antibiotics. She never got the MRI because she is claustrophobic. She expressed frustration about not receiving antibiotics so she took an old antibiotic that she had at home. Her foot did improve and the swelling decreased but began to worsen again this week. The last time she took the antibiotic was this past weekend. She does not recall the name of the antibiotic but has the bottle at home. She eventually got the MRI on 8/28 and she was called the next day to come into the hospital because the MRI had shown an infection in her foot involving the bone. She saw the orthopaedic surgeon this admission and she stated that he wanted to cut off her foot, which she disagreed. She states that she wants to do  everything possible first to avoid surgery and amputation, and thus wants to do IV antibiotics. Today, she feels well and does not have pain in her foot. She denies fevers, chills, or sweats.    Review of Systems: Review of Systems  Constitutional: Negative for chills, diaphoresis and fever.  Respiratory: Negative for shortness of breath.   Cardiovascular: Negative for chest pain and palpitations.  Gastrointestinal: Negative for abdominal pain.  Musculoskeletal:       Left Foot pain  Skin: Negative for rash.  Neurological: Negative for sensory change and focal weakness.      Past Medical History:  Diagnosis Date  . Anemia   . Asthma 11/01/2010  . Diabetes mellitus    since 1983  . GERD (gastroesophageal reflux disease)   . Hypertension   . Osteomyelitis ([place])   . Pneumonia    as a child    Social History   Tobacco Use  . Smoking status: Never Smoker  . Smokeless tobacco: Never Used  Substance Use Topics  . Alcohol use: No    Alcohol/week: 0.0 standard drinks  . Drug use: No    Family History  Problem Relation Age of Onset  . Diabetes Father   . Hypertension Father   . Hypertension Mother   . Dementia Mother   . Diabetes Daughter    Allergies  Allergen Reactions  . Aspirin Nausea And Vomiting  . Ibuprofen Nausea And Vomiting and Other (See Comments)    Abdominal pain  . Penicillins Other (See Comments)    unknown    OBJECTIVE: Blood pressure (!) 145/69, pulse (!) 106, temperature 98.5 F (36.9 C), temperature source Oral, resp. rate 16, height 6\' 1"  (1.854 m), weight 129.5 kg, last menstrual period 06/23/2014, SpO2 100 %.  Physical Exam  Constitutional: She is oriented to person, place, and time. She appears well-developed and well-nourished. No distress.  Eyes: Conjunctivae are normal.  Cardiovascular: Normal rate, regular rhythm, normal heart sounds and intact distal pulses.  Pulmonary/Chest: Effort normal and breath sounds normal.  Abdominal: Soft.  Musculoskeletal: Normal range of motion.  Left foot: ulcer on the plantar surface over 2nd metatarsal area. Nontender but swollen, warm to touch. No redness. Rash under 3rd toe consistent w/ tenia pedis. Amputated great toe and 2nd toe. Normal strength and range of motion.   Neurological: She is alert and oriented to person, place, and time.  Left foot: decreased sensation to light touch over most of foot including ulcer site.   Skin:  Left foot as above    Lab Results Lab Results  Component Value Date   WBC 5.3 07/27/2018   HGB 12.6 07/27/2018   HCT 40.4 07/27/2018    MCV 97.8 07/27/2018   PLT 230 07/27/2018    Lab Results  Component Value Date   CREATININE 0.78 07/27/2018   BUN 9 07/27/2018   NA 136 07/27/2018   K 4.2 07/27/2018   CL 99 07/27/2018   CO2 29 07/27/2018    Lab Results  Component Value Date   ALT 27 08/29/2014   AST 25 08/29/2014   ALKPHOS 111 08/29/2014   BILITOT 0.3 08/29/2014     Microbiology: No results found for this or any previous visit (from the past 240 hour(s)).  Durenda Hurt, [facility] for Infectious [place] [facility] [phone] pager   [phone] cell 07/27/2018, 1:34 PM

## 2018-07-27 NOTE — Progress Notes (Signed)
[facility]  [facility] will provide [facility] services- pharmacy and [facility]- at [place].  [facility] is able to support weekend DC for IV ABX as pt is able to be independent with IV ABX administration, however HHRN will not be available for home visit until 07/31/18.  If patient discharges after hours, please call [phone].   Larry Sierras 07/27/2018, 3:49 PM

## 2018-07-27 NOTE — Progress Notes (Signed)
PICC placement noted;okay for PICC insertion to left arm when blood cultures are clear for 48 hours per Donne Hazel MD.

## 2018-07-27 NOTE — Plan of Care (Signed)
Problem: Clinical Measurements: Goal: Ability to maintain clinical measurements within normal limits will improve Outcome: Progressing Goal: Cardiovascular complication will be avoided Outcome: Progressing   Problem: Nutrition: Goal: Adequate nutrition will be maintained Outcome: Progressing   Problem: Elimination: Goal: Will not experience complications related to bowel motility Outcome: Progressing Goal: Will not experience complications related to urinary retention Outcome: Progressing

## 2018-07-27 NOTE — Care Management Note (Signed)
Case Management Note  Patient Details  Name: [name] MRN: [id number] Date of Birth: March 14, 1961  Subjective/Objective:   57 yr old female admitted with acute osteomyeilytis of second toe left foot.                    Action/Plan: Patient is presently not agreeable to amputation, wants to exhaust options with antibiotics first. Case manager spoke with patient concerning need for Home Health RN and PICC line. Patient says she is familiar, she has done IV antibiotics before. Referral for [facility] and IV antibiotics called to Neoma Laming, [place] Liaison, and Carolynn Sayers, IV antibiotics specialist with [place].    Expected Discharge Date:  pending               Expected Discharge Plan:  [place]  In-House Referral:  NA  Discharge planning Services  CM Consult  Post Acute Care Choice:  Home Health, Durable Medical Equipment Choice offered to:  Patient  DME Arranged:  IV pump/equipment DME Agency:  [place]:  RN [facility] Agency:  [place]  Status of Service:  In process, will continue to follow  If discussed at Long Length of Stay Meetings, dates discussed:    Additional Comments:  Ninfa Meeker, RN 07/27/2018, 11:16 AM

## 2018-07-27 NOTE — Discharge Summary (Signed)
Name: [name] MRN: [id number] DOB: 23-May-1961 57 y.o. PCP: Debra Roan, MD  Date of Admission: 07/26/2018  5:07 PM Date of Discharge: 9/1/201909/11/2017 Attending Physician: Dr. Eppie Rivera  Discharge Diagnosis: 1. Acute osteomyelitis, left foot, second metatarsal head  Discharge Medications: Allergies as of 07/29/2018      Reactions   Aspirin Nausea And Vomiting   Ibuprofen Nausea And Vomiting, Other (See Comments)   Abdominal pain   Penicillins Other (See Comments)   unknown      Medication List    STOP taking these medications   acetaminophen 650 MG CR tablet Commonly known as:  TYLENOL   insulin aspart protamine - aspart (70-30) 100 UNIT/ML FlexPen Commonly known as:  NOVOLOG 70/30 MIX Replaced by:  insulin aspart protamine- aspart (70-30) 100 UNIT/ML injection     TAKE these medications   atorvastatin 40 MG tablet Commonly known as:  LIPITOR Take 1 tablet (40 mg total) by mouth daily.   AUTO-LANCET Misc Prick finger for BG check as prescribed.   blood glucose meter kit and supplies Dispense based on patient and insurance preference. Use up to four times daily as directed.   blood glucose meter kit and supplies Kit Dispense based on patient and insurance preference. Use up to four times daily as directed. (FOR ICD-9 250.00, 250.01).   ertapenem  IVPB Commonly known as:  INVANZ Inject 1 g into the vein daily. Indication:  Osteomyelitis  Last Day of Therapy:  09/07/2018 Labs - Once weekly:  CBC/D and BMP, Labs - Every other week:  ESR and CRP   exenatide 10 MCG/0.04ML Sopn injection Commonly known as:  BYETTA Inject 0.04 mLs (10 mcg total) into the skin 2 (two) times daily with a meal.   gabapentin 300 MG capsule Commonly known as:  NEURONTIN Take 1 capsule (300 mg total) by mouth 3 (three) times daily.   glucose blood test strip Use as instructed   insulin aspart 100 UNIT/ML FlexPen Commonly known as:  NOVOLOG Inject 13 Units into the skin  3 (three) times daily with meals.   insulin aspart protamine- aspart (70-30) 100 UNIT/ML injection Commonly known as:  NOVOLOG MIX 70/30 Inject 0.55 mLs (55 Units total) into the skin 2 (two) times daily with a meal. Replaces:  insulin aspart protamine - aspart (70-30) 100 UNIT/ML FlexPen   Insulin Detemir 100 UNIT/ML Pen Commonly known as:  LEVEMIR Inject 40 Units into the skin at bedtime.   Insulin Pen Needle 29G X 5MM Misc Inject under the skin as prescribed.   Insulin Syringes (Disposable) U-100 1 ML Misc Inject 0.55 mLs into the skin 2 (two) times daily with a meal.   Lancets Misc 1 each by Does not apply route 4 (four) times daily -  before meals and at bedtime.   lisinopril 5 MG tablet Commonly known as:  PRINIVIL,ZESTRIL Take 1 tablet (5 mg total) by mouth at bedtime. IM PROGRAM What changed:    how much to take  additional instructions   metFORMIN 1000 MG tablet Commonly known as:  GLUCOPHAGE TAKE 1 Tablet  BY MOUTH TWICE DAILY WITH A MEAL What changed:    how much to take  how to take this  when to take this  additional instructions   multivitamin with minerals Tabs tablet Take 1 tablet by mouth daily as needed.   omeprazole 20 MG capsule Commonly known as:  PRILOSEC Take 1 capsule (20 mg total) by mouth daily.   TRUE METRIX METER Devi 1  Device by Does not apply route 2 (two) times daily.            Home Infusion Instuctions  (From admission, onward)         Start     Ordered   07/29/18 0000  Home infusion instructions [facility] May follow [place] Dosing Protocol; May administer Cathflo as needed to maintain patency of vascular access device.; Flushing of vascular access device: per [facility] Protocol: 0.9% NaCl pre/post medica...    Question Answer Comment  Instructions May follow [place] Dosing Protocol   Instructions May administer Cathflo as needed to maintain patency of vascular access device.   Instructions Flushing of  vascular access device: per [facility] Protocol: 0.9% NaCl pre/post medication administration and prn patency; Heparin 100 u/ml, 108m for implanted ports and Heparin 10u/ml, 555mfor all other central venous catheters.   Instructions May follow [facility] Anaphylaxis Protocol for First Dose Administration in the home: 0.9% NaCl at 25-50 ml/hr to maintain IV access for protocol meds. Epinephrine 0.3 ml IV/IM PRN and Benadryl 25-50 IV/IM PRN s/s of anaphylaxis.   Instructions [facility] Infusion Coordinator (RN) to assist per patient IV care needs in the home PRN.      07/29/18 1536          Disposition and follow-up:   [name]  At the hospital follow up visit please address:  1.  Osteomyelitis w/ overlying cellulitis: please assess for symptoms of edema/erythema at her next office visit. She is to continue 6 weeks of ertapenem IV antibiotic treatment via home health.      DMII: Patient would benefit from continued diabetes management and continued titration of her insulin as was occurring as an outpatient.  2.  Labs / imaging needed at time of follow-up: n/a  3.  Pending labs/ test needing follow-up: n/a  Follow-up Appointments: Follow-up Information    Health, [facility]-Home Follow up.   Specialty:  [place]:  A representative from [facility] contact you to arrange start date and time for Home Health nurse  Contact information: [phone]           Hospital Course by problem list:  [name] a [age].o.African-American woman with uncontrolled type 2 DM complicated by peripheral arterial disease, neuropathy, and previous amputations of left great and second toes who presents with 3 months of worsening ulcer on left plantar forefoot and MRI findings consistent with acute osteo of left second metatarsal head.  1.  Acute Osteomyelitis, left foot, second metatarsal head: Ortho was consulted. She declined amputation at this time and wanted to try all non-surgical treatments first. PICC was placed. She was started on vanc/cefepime. Wound cultures revealed rare Group B strep. Ertapenem was continued and she was discharged with home health to assist with the IV treatment and PICC line care ending on approximately October 13th as per ID recs.   2. Diabetes, Type 2:  Patient poorly controlled on 70/30. Have attempted to obtain Levemir and Aspart Pens through the IM program on discharge at 45U Long acting and 15U TID WC short acting. Please assess for appropriateness and affordability.   Discharge Vitals:   BP (!) 147/81 (BP Location: Right Arm)   Pulse 100   Temp 98 F (36.7 C) (Oral)   Resp 18   Ht _0  (1.854 m)   Wt  129.5 kg   LMP 06/23/2014   SpO2 100%   BMI 37.67 kg/m   Pertinent Labs, Studies, and Procedures:   MRI, left foot:  1. Plantar soft tissue ulcer with enhancing subcutaneous soft tissue abscess along the plantar aspect of the second metatarsal head and neck. The enhancing fluid measures approximately 10 x 7 x 4 mm. 2. Abnormal marrow signal abnormality of the second metatarsal head and neck compatible with acute osteomyelitis. 3. Status post amputation of the great and second toe at the level of the MTP articulations. 4. Forefoot cellulitis.  Discharge Instructions: Discharge Instructions    Call MD for:  difficulty breathing, headache or visual disturbances   Complete by:  As directed    Call MD for:  persistant nausea and vomiting   Complete by:  As directed    Call MD for:  redness, tenderness, or signs of infection (pain, swelling, redness, odor or green/yellow discharge around incision site)   Complete by:  As directed    Call MD for:  temperature >100.4   Complete by:  As directed    Diet - low sodium heart healthy   Complete by:  As directed    Home infusion  instructions [facility] May follow [place] Dosing Protocol; May administer Cathflo as needed to maintain patency of vascular access device.; Flushing of vascular access device: per [facility] Protocol: 0.9% NaCl pre/post medica...   Complete by:  As directed    Instructions:  May follow [place] Dosing Protocol   Instructions:  May administer Cathflo as needed to maintain patency of vascular access device.   Instructions:  Flushing of vascular access device: per [facility] Protocol: 0.9% NaCl pre/post medication administration and prn patency; Heparin 100 u/ml, 53m for implanted ports and Heparin 10u/ml, 525mfor all other central venous catheters.   Instructions:  May follow [facility] Anaphylaxis Protocol for First Dose Administration in the home: 0.9% NaCl at 25-50 ml/hr to maintain IV access for protocol meds. Epinephrine 0.3 ml IV/IM PRN and Benadryl 25-50 IV/IM PRN s/s of anaphylaxis.   Instructions:  [facility] Coordinator (RN) to assist per patient IV care needs in the home PRN.   Increase activity slowly   Complete by:  As directed       Signed: HaKathi LudwigMD 08/03/2018, 1:19 PM   Pager: _0 @

## 2018-07-27 NOTE — Progress Notes (Signed)
   Subjective: Physically feeling well this morning. Did have some bilateral leg cramping last night in her hamstrings. She had some complaints about how things were handled last night. We discussed the chain of events and apologized for any miscommunications. We do not plan on amputating. We will do a prolonged IV antibiotic course first. She is on board with this plan. If antibiotics don't work, then she is open to discussing amputation.   Objective:  Vital signs in last 24 hours: Vitals:   07/26/18 1725 07/26/18 1935 07/27/18 0441  BP: 130/70 (!) 144/74 138/76  Pulse: 92 94 (!) 109  Resp: 14 16 16   Temp: 98.2 F (36.8 C) 98.8 F (37.1 C) 98.4 F (36.9 C)  TempSrc: Oral Oral Oral  SpO2: 100% 96% 99%  Weight: 129.5 kg    Height: 6\' 1"  (1.854 m)     Gen: Well appearing, NAD CV: RRR, no murmurs Abd: Soft, NT, ND Ext: Warm, strong DP and TP pulses bilaterally. Wound on plantar surface of left foot is unchanged. Left leg with nonpitting edema without erythema or tenderness.    Assessment/Plan:  Principal Problem:   Osteomyelitis ([place]) Active Problems:   Hypertension associated with diabetes ([place])   Diabetes ([place])   Ms.Nastacia Thomasis a 57 y.o.African-American woman with uncontrolled type 2 DM complicated by peripheral arterial disease, neuropathy, and previous amputations of left great and second toes who presents with 3 months of worsening ulcer on left plantar forefoot and MRI findings consistent with acute osteo of left second metatarsal head.  Acute Osteomyelitis: hemodynamically stable. Consulted ortho. They do not feel a bone biopsy is needed. Patient declines amputation at this time. Will consult ID for biopsy and antibiotic recommendations. Draw blood cultures today. She will need a PICC after we have confirmed that blood cultures are negative. She lives alone and has done home antibiotic infusions before. She feels very comfortable with doing them again.   Diabetes,  type II: last a1c 11.5. Home regiment includes metformin 1000mg , 70/30 55U BID  - continue home gabapentin - Lantus 45U daily  - Novolog 6U wc  HTN: continue home statin. Hold home lisinopril  Dispo: Anticipated discharge in approximately 3-5 day(s).   Isabelle Course, MD 07/27/2018, 11:34 AM Pager: [phone]

## 2018-07-27 NOTE — Assessment & Plan Note (Signed)
Diabetic foot ulcer: [name] has a history of uncontrolled (A1c 11.5 05/2018), insulin-dependent R5JO complicated by peripheral neuropathy s/p L 1st and 2nd amputations. She presents today for follow up of L foot ulcer that has been present for 3 weeks after receiving a call that her L foot MRI showed acute osteomyelitis of her 2nd metatarsal head and neck, an abscess along the plantar 2nd metatarsal that measures 10 x 7 x 4 mm, and forefoot cellulitis. She denies systemic symptoms or pain on ambulation. Per last note, she has been taking antibiotics at home, but unclear which one. She did not bring the bottle today. Plan to admit for IV antibiotic therapy and orthopedic surgery consultation.

## 2018-07-28 DIAGNOSIS — E11621 Type 2 diabetes mellitus with foot ulcer: Secondary | ICD-10-CM

## 2018-07-28 DIAGNOSIS — L03032 Cellulitis of left toe: Secondary | ICD-10-CM

## 2018-07-28 DIAGNOSIS — L97529 Non-pressure chronic ulcer of other part of left foot with unspecified severity: Secondary | ICD-10-CM

## 2018-07-28 DIAGNOSIS — M869 Osteomyelitis, unspecified: Secondary | ICD-10-CM

## 2018-07-28 LAB — CBC
HEMATOCRIT: 37.3 % (ref 36.0–46.0)
Hemoglobin: 11.7 g/dL — ABNORMAL LOW (ref 12.0–15.0)
MCH: 30.5 pg (ref 26.0–34.0)
MCHC: 31.4 g/dL (ref 30.0–36.0)
MCV: 97.4 fL (ref 78.0–100.0)
PLATELETS: 203 10*3/uL (ref 150–400)
RBC: 3.83 MIL/uL — ABNORMAL LOW (ref 3.87–5.11)
RDW: 12.4 % (ref 11.5–15.5)
WBC: 5.3 10*3/uL (ref 4.0–10.5)

## 2018-07-28 LAB — GLUCOSE, CAPILLARY
GLUCOSE-CAPILLARY: 316 mg/dL — AB (ref 70–99)
GLUCOSE-CAPILLARY: 398 mg/dL — AB (ref 70–99)
Glucose-Capillary: 357 mg/dL — ABNORMAL HIGH (ref 70–99)
Glucose-Capillary: 236 mg/dL — ABNORMAL HIGH (ref 70–99)

## 2018-07-28 LAB — BASIC METABOLIC PANEL
Anion gap: 9 (ref 5–15)
BUN: 9 mg/dL (ref 6–20)
CALCIUM: 8.9 mg/dL (ref 8.9–10.3)
CO2: 27 mmol/L (ref 22–32)
CREATININE: 0.7 mg/dL (ref 0.44–1.00)
Chloride: 101 mmol/L (ref 98–111)
GFR calc Af Amer: >60 mL/min (ref >60)
GLUCOSE: 272 mg/dL — AB (ref 70–99)
POTASSIUM: 4.3 mmol/L (ref 3.5–5.1)
Sodium: 137 mmol/L (ref 135–145)

## 2018-07-28 MED ORDER — INSULIN ASPART 100 UNIT/ML ~~LOC~~ SOLN
8.0000 [IU] | Freq: Three times a day (TID) | SUBCUTANEOUS | Status: DC
  Administered 2018-07-28 – 2018-07-29 (×3): 8 [IU] via SUBCUTANEOUS

## 2018-07-28 MED ORDER — INSULIN GLARGINE 100 UNIT/ML ~~LOC~~ SOLN
40.0000 [IU] | Freq: Every day | SUBCUTANEOUS | Status: DC
  Administered 2018-07-29: 40 [IU] via SUBCUTANEOUS
  Filled 2018-07-28: qty 0.4

## 2018-07-28 MED ORDER — RAMELTEON 8 MG PO TABS
8.0000 mg | ORAL_TABLET | Freq: Every day | ORAL | Status: DC
  Filled 2018-07-28 (×2): qty 1

## 2018-07-28 MED ORDER — INSULIN ASPART 100 UNIT/ML ~~LOC~~ SOLN
0.0000 [IU] | Freq: Three times a day (TID) | SUBCUTANEOUS | Status: DC
  Administered 2018-07-28: 15 [IU] via SUBCUTANEOUS
  Administered 2018-07-29: 5 [IU] via SUBCUTANEOUS
  Administered 2018-07-29: 11 [IU] via SUBCUTANEOUS

## 2018-07-28 MED ORDER — NON FORMULARY: 6.0000 mg | Freq: Once | Status: DC

## 2018-07-28 MED ORDER — INSULIN ASPART 100 UNIT/ML ~~LOC~~ SOLN: 0.0000 [IU] | Freq: Every day | SUBCUTANEOUS | Status: DC

## 2018-07-28 MED ORDER — MELATONIN 3 MG PO TABS
6.0000 mg | ORAL_TABLET | Freq: Once | ORAL | Status: DC
  Filled 2018-07-28 (×2): qty 2

## 2018-07-28 NOTE — Progress Notes (Signed)
   Subjective: The patient was resting on the edge of her bed today finishing breakfast. She is persistently dissatisfied with the communication from the orthopedic surgeon. I again explained that although I was not present during the prior encounter I am sure that the procedure was advisable if not necessarily portrayed in that manner. It was again reiterated that the infection could indeed worsen and not just lie dormant if she were to continue treatment with IV antibiotics alone to which she agreed to the risk stating that she would prefer to give her foot a chance to survive.    Objective:  Vital signs in last 24 hours: Vitals:   07/27/18 0441 07/27/18 1234 07/27/18 2137 07/28/18 0428  BP: 138/76 (!) 145/69 137/79 (!) 151/91  Pulse: (!) 109 (!) 106 93 92  Resp: 16 16    Temp: 98.4 F (36.9 C) 98.5 F (36.9 C) 99.5 F (37.5 C) 98 F (36.7 C)  TempSrc: Oral Oral Oral   SpO2: 99% 100% 99% 97%  Weight:      Height:       Physical exam: General: Alert and oriented x4, no acute distress, afebrile, nondiaphoretic, no acute pain Cardiovascular: RRR no murmurs rubs or gallops auscultated Cardiac pulmonary: Lungs clear to auscultation bilaterally no rhonchi or crackles Gastrointestinal: Abdomen is soft, nontender, nondistended Musculoskeletal: Bilateral lower extremity nonedematous, left foot dorsal surface just mildly tender to palpation remains wrapped.  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  Recent MRI findings were consistent with acute osteo-and overlying cellulitis affecting the left second metatarsal head.  Plan: Acute osteomyelitis: Patient remains hemodynamically stable and in good spirits today without signs of systemic infection.  Orthopedics was consulted for wound culture biopsy, however they had planned amputation instead as they felt this was most appropriate given her degree of PAD and severity of the infection.  Patient declines amputation at this time as she would "like to give her foot a chance".  ID has been consulted for antibiotic recommendations and have placed her on ertapenem currently.  Patient will need a PICC line following 48 hours of negative blood cultures (09/01 @ 1300hrs) and home health for IV antibiotic administration.  She will likely be discharged home on 9/2-3 pending completion of evaluation, negative blood cultures and PICC line placement. -Order for PICC line placed acknowledged pending 40 hours of negative blood cultures --Continue ertapenem as per ID recs  Diabetes mellitus: Last A1c 11.5.  Patient's home regimen includes metformin without thousand milligrams twice daily, 55 units twice daily 70/30 insulin.  -Patient placed on Lantus 40 units daily -NovoLog 8 units with meals 3 times daily plus SSI moderate -Continue gabapentin for peripheral neuropathy  HTN: Continue home statin and lisinopril, no acute issues.  Dispo: Anticipated discharge in approximately 2-3 day(s).   [name], MD 07/28/2018, 10:59 AM Pager: Pager# [phone]

## 2018-07-28 NOTE — Plan of Care (Signed)
  Problem: Pain Managment: Goal: General experience of comfort will improve Outcome: Progressing   

## 2018-07-28 NOTE — Plan of Care (Signed)
  Problem: Activity: Goal: Risk for activity intolerance will decrease Outcome: Progressing   Problem: Nutrition: Goal: Adequate nutrition will be maintained Outcome: Progressing   Problem: Coping: Goal: Level of anxiety will decrease Outcome: Progressing   Problem: Pain Managment: Goal: General experience of comfort will improve Outcome: Completed/Met   Problem: Safety: Goal: Ability to remain free from injury will improve Outcome: Progressing   Problem: Skin Integrity: Goal: Risk for impaired skin integrity will decrease Outcome: Progressing

## 2018-07-28 NOTE — Progress Notes (Signed)
[name], female   DOB: 07/08/1961, 57 y.o.   MRN: [id number]         [facility] for Infectious Disease  Date of Admission:  07/26/2018           Day 2 ertapenem ASSESSMENT: She has left second metatarsal osteomyelitis with a small plantar abscess complicating a chronic, recurrent plantar ulcer.  This is most likely polymicrobial.  Gram stain of ulcer drainage reveals gram-positive cocci in pairs.  I plan on 6 weeks of IV ertapenem.  PLAN: 1. Continue ertapenem pending final cultures 2. I am okay with having PICC placed now  Principal Problem:   Osteomyelitis ([place]) Active Problems:   Hypertension associated with diabetes ([place])   Diabetes ([place])   Scheduled Meds: . atorvastatin  40 mg Oral Daily  . enoxaparin (LOVENOX) injection  40 mg Subcutaneous Q24H  . gabapentin  300 mg Oral TID  . insulin aspart  0-15 Units Subcutaneous TID WC  . insulin aspart  0-5 Units Subcutaneous QHS  . insulin aspart  8 Units Subcutaneous TID WC  . [START ON 07/29/2018] insulin glargine  40 Units Subcutaneous Daily  . pantoprazole  40 mg Oral Daily   Continuous Infusions: . ertapenem Stopped (07/27/18 1647)   PRN Meds:.acetaminophen, methocarbamol   SUBJECTIVE: She is without complaint.  Review of Systems: Review of Systems  Constitutional: Negative for chills, diaphoresis and fever.  Gastrointestinal: Negative for abdominal pain, diarrhea, nausea and vomiting.  Musculoskeletal: Negative for joint pain.    Allergies  Allergen Reactions  . Aspirin Nausea And Vomiting  . Ibuprofen Nausea And Vomiting and Other (See Comments)    Abdominal pain  . Penicillins Other (See Comments)    unknown    OBJECTIVE: Vitals:   07/27/18 1234 07/27/18 2137 07/28/18 0428 07/28/18 1229  BP: (!) 145/69 137/79 (!) 151/91 (!) 147/84  Pulse: (!) 106 93 92 97  Resp: 16     Temp: 98.5 F (36.9 C) 99.5 F (37.5 C) 98 F (36.7 C) 97.7 F (36.5 C)  TempSrc: Oral Oral  Oral  SpO2:  100% 99% 97%   Weight:      Height:       Body mass index is 37.67 kg/m.  Physical Exam  Constitutional:  She is in good spirits sitting up on the side of her bed.  She is visiting with family.    Lab Results Lab Results  Component Value Date   WBC 5.3 07/28/2018   HGB 11.7 (L) 07/28/2018   HCT 37.3 07/28/2018   MCV 97.4 07/28/2018   PLT 203 07/28/2018    Lab Results  Component Value Date   CREATININE 0.70 07/28/2018   BUN 9 07/28/2018   NA 137 07/28/2018   K 4.3 07/28/2018   CL 101 07/28/2018   CO2 27 07/28/2018    Lab Results  Component Value Date   ALT 27 08/29/2014   AST 25 08/29/2014   ALKPHOS 111 08/29/2014   BILITOT 0.3 08/29/2014     Microbiology: Recent Results (from the past 240 hour(s))  Culture, blood (routine x 2)     Status: None (Preliminary result)   Collection Time: 07/27/18 11:45 AM  Result Value Ref Range Status   Specimen Description BLOOD RIGHT ANTECUBITAL  Final   Special Requests   Final    BOTTLES DRAWN AEROBIC AND ANAEROBIC Blood Culture adequate volume   Culture   Final    NO GROWTH 1 DAY Performed at [facility],  [address], [place], [place] [address]    Report Status PENDING  Incomplete  Culture, blood (routine x 2)     Status: None (Preliminary result)   Collection Time: 07/27/18 12:00 PM  Result Value Ref Range Status   Specimen Description BLOOD RIGHT ANTECUBITAL  Final   Special Requests   Final    BOTTLES DRAWN AEROBIC AND ANAEROBIC Blood Culture adequate volume   Culture   Final    NO GROWTH 1 DAY Performed at [facility], [place] [address] [place], [place] [address]    Report Status PENDING  Incomplete  Aerobic/Anaerobic Culture (surgical/deep wound)     Status: None (Preliminary result)   Collection Time: 07/27/18  6:00 PM  Result Value Ref Range Status   Specimen Description WOUND FOOT LEFT  Final   Special Requests NONE  Final   Gram Stain   Final    RARE WBC PRESENT, PREDOMINANTLY PMN FEW GRAM  POSITIVE COCCI IN PAIRS    Culture   Final    CULTURE REINCUBATED FOR BETTER GROWTH Performed at [facility], [place] [address], [place], [place] [address]    Report Status PENDING  Incomplete  Surgical pcr screen     Status: None   Collection Time: 07/27/18  6:00 PM  Result Value Ref Range Status   MRSA, PCR NEGATIVE NEGATIVE Final   Staphylococcus aureus NEGATIVE NEGATIVE Final    Comment: (NOTE) The Xpert SA Assay (FDA approved for NASAL specimens in patients 56 years of age and older), is one component of a comprehensive surveillance program. It is not intended to diagnose infection nor to guide or monitor treatment. Performed at [facility], [place] [address], [place], [place] [address]     Michel Bickers, [place] for Infectious [place] [facility] [phone] pager   [phone] cell 07/28/2018, 3:51 PM

## 2018-07-28 NOTE — Progress Notes (Signed)
Spoke with Rodena Piety RN re PICC placement to be done once blood cultures are negative x 48 hrs.

## 2018-07-28 NOTE — Progress Notes (Signed)
Pt upset about her home meds not being given to her while in the hospital, especially her insulin for her blood sugar. On call MD was notified and informed RN that issues were to be addressed by the attending MD in the AM. Melatonin was ordered as patient requested for sleeping pill. Will pass this to the oncoming RN.

## 2018-07-29 DIAGNOSIS — L03116 Cellulitis of left lower limb: Secondary | ICD-10-CM

## 2018-07-29 DIAGNOSIS — B951 Streptococcus, group B, as the cause of diseases classified elsewhere: Secondary | ICD-10-CM

## 2018-07-29 DIAGNOSIS — Z95828 Presence of other vascular implants and grafts: Secondary | ICD-10-CM

## 2018-07-29 LAB — GLUCOSE, CAPILLARY
GLUCOSE-CAPILLARY: 206 mg/dL — AB (ref 70–99)
GLUCOSE-CAPILLARY: 314 mg/dL — AB (ref 70–99)
Glucose-Capillary: 313 mg/dL — ABNORMAL HIGH (ref 70–99)

## 2018-07-29 MED ORDER — INSULIN ASPART PROT & ASPART (70-30 MIX) 100 UNIT/ML ~~LOC~~ SUSP: 55.0000 [IU] | Freq: Two times a day (BID) | SUBCUTANEOUS | 0 refills | Status: DC

## 2018-07-29 MED ORDER — SODIUM CHLORIDE 0.9% FLUSH: 10.0000 mL | Freq: Two times a day (BID) | INTRAVENOUS | Status: DC

## 2018-07-29 MED ORDER — INSULIN ASPART 100 UNIT/ML FLEXPEN: 13.0000 [IU] | PEN_INJECTOR | Freq: Three times a day (TID) | SUBCUTANEOUS | 11 refills | Status: DC

## 2018-07-29 MED ORDER — HEPARIN SOD (PORK) LOCK FLUSH 100 UNIT/ML IV SOLN
250.0000 [IU] | INTRAVENOUS | Status: AC | PRN
  Administered 2018-07-29: 250 [IU]

## 2018-07-29 MED ORDER — INSULIN ASPART PROT & ASPART (70-30 MIX) 100 UNIT/ML ~~LOC~~ SUSP
55.0000 [IU] | Freq: Two times a day (BID) | SUBCUTANEOUS | Status: DC
  Administered 2018-07-29: 55 [IU] via SUBCUTANEOUS
  Filled 2018-07-29: qty 10

## 2018-07-29 MED ORDER — INSULIN DETEMIR 100 UNIT/ML FLEXPEN: 40.0000 [IU] | PEN_INJECTOR | Freq: Every day | SUBCUTANEOUS | 11 refills | Status: DC

## 2018-07-29 MED ORDER — SODIUM CHLORIDE 0.9% FLUSH
10.0000 mL | INTRAVENOUS | Status: DC | PRN
  Administered 2018-07-29: 10 mL
  Filled 2018-07-29: qty 40

## 2018-07-29 MED ORDER — INSULIN SYRINGES (DISPOSABLE) U-100 1 ML MISC: 0.5500 mL | Freq: Two times a day (BID) | 0 refills | Status: DC

## 2018-07-29 MED ORDER — ERTAPENEM IV (FOR PTA / DISCHARGE USE ONLY): 1.0000 g | INTRAVENOUS | 0 refills | Status: DC

## 2018-07-29 NOTE — Discharge Instructions (Signed)
Please take your 55U of 70/30 insulin twice daily including the evening of discharge as you have been until you acquire your new long and short acting insulin pens. As can occur, there may be an error with the prescription being sent to the pharmacy or with insurance coverage issues etc., and as such, please do not hesitate to call [phone] and notify the front desk staff that you spoke with Dr. Berline Lopes who discharged you whereupon he advised you to contact the main office on the condition that there was a concern with the script as I am familiar with the case. They will in turn notify me and I will attempt to rectify the issue as soon as possible.   If you develop lightheadedness, dizziness, sweating, weakness or confusion please drink a small amount of orange juice and check your blood glucose levels while notifying your doctor of your symptoms as these may be a sign of low blood sugar. As always, please check your glucose levels prior to each meal and before med.   If you have any concerns or questions please do not hesitate to call our clinic 24/7.  Thank you for your visit to [facility].

## 2018-07-29 NOTE — Progress Notes (Signed)
Internal Medicine Clinic Attending  Case discussed with Dr. Santos-Sanchez at the time of the visit.  We reviewed the resident's history and exam and pertinent patient test results.  I agree with the assessment, diagnosis, and plan of care documented in the resident's note.  Alexander Raines, M.D., Ph.D.  

## 2018-07-29 NOTE — Progress Notes (Signed)
Peripherally Inserted Central Catheter/Midline Placement  The IV Nurse has discussed with the patient and/or persons authorized to consent for the patient, the purpose of this procedure and the potential benefits and risks involved with this procedure.  The benefits include less needle sticks, lab draws from the catheter, and the patient may be discharged home with the catheter. Risks include, but not limited to, infection, bleeding, blood clot (thrombus formation), and puncture of an artery; nerve damage and irregular heartbeat and possibility to perform a PICC exchange if needed/ordered by physician.  Alternatives to this procedure were also discussed.  Bard Power PICC patient education guide, fact sheet on infection prevention and patient information card has been provided to patient /or left at bedside.    PICC/Midline Placement Documentation  PICC Single Lumen 48/25/00 PICC Left Basilic 44 cm 0 cm (Active)  Indication for Insertion or Continuance of Line Home intravenous therapies (PICC only) 07/29/2018 12:15 PM  Exposed Catheter (cm) 0 cm 07/29/2018 12:15 PM  Site Assessment Clean;Dry;Intact 07/29/2018 12:15 PM  Line Status Flushed;Saline locked;Blood return noted 07/29/2018 12:15 PM  Dressing Type Transparent 07/29/2018 12:15 PM  Dressing Status Clean;Dry;Intact;Antimicrobial disc in place 07/29/2018 12:15 PM  Line Care Connections checked and tightened 07/29/2018 12:15 PM  Line Adjustment (NICU/IV Team Only) No 07/29/2018 12:15 PM  Dressing Intervention New dressing 07/29/2018 12:15 PM  Dressing Change Due 08/05/18 07/29/2018 12:15 PM       Rolena Infante 07/29/2018, 12:16 PM

## 2018-07-29 NOTE — Progress Notes (Signed)
   Subjective: Patient was resting comfortably in bed today upon entering the room.  She denied acute complaints and her foot felt much better and the swelling appears to diminished.  There is significantly less pain in her foot.  Patient is in agreement with the plan to place the PICC line possible discharge following placement.  She is aware that home health has been arranged for her 6 weeks of antibiotic therapy.  Objective:  Vital signs in last 24 hours: Vitals:   07/27/18 2137 07/28/18 0428 07/28/18 1229 07/28/18 2020  BP: 137/79 (!) 151/91 (!) 147/84 (!) 179/82  Pulse: 93 92 97 91  Resp:      Temp: 99.5 F (37.5 C) 98 F (36.7 C) 97.7 F (36.5 C) 98.1 F (36.7 C)  TempSrc: Oral  Oral Oral  SpO2: 99% 97%  98%  Weight:      Height:       Physical exam: General: Patient alert and oriented no acute distress, afebrile, nondiaphoretic Cardia vascular: RRR no murmurs rubs or gallops auscultated Pulmonary: Lungs clear to auscultation bilaterally Musculoskeletal: Left distal lower extremity nonedematous, nontender to palpation, wound appears intact rather stable.  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  Recent MRI findings were consistent with acute osteo-and overlying cellulitis affecting the left second metatarsal head requiring antibiotic therapy x 6 weeks in an attempt to avoid amputation.   Plan: Acute Osteomyelitis: Patient stable again today. NO acute concerns, in better spirits with decreasing pain in the left foot. She is agreeable to the PICC line placement today  and discharge home on IV ertapenem. She understand the need to keep the line clean and clear of infection. HH has been ordered and will meet her to establish care. OPAT has been completed and is being signed on discharge.  Continue ertapenem as per ID recs x 6 weeks Continue PICC line   Diabetes Mellitus: Patient desired to be transitioned to basal bolus. I was able to locate a levemir pen and aspart pen on the [facility] program list that can be obtained by the patient at the [facility] outpatient pharmacy on Tuesday. Meanwhile, she is out of insulin. As such, she is to be sent home with a vial of 70/30 which she will take for the next 2-3 days at 55U BID wc (her prior dose) until such time that she has her insulin pens. I have provided her with a paper script to better enable her to obtain 15ml insulin syringes to use with the 70/30.  Plan for 45U of lantus QHS and 15U Aspart TID WC w/ close outpatient follow-up. She is to call with questions.   Dispo: Anticipated discharge in approximately 0 day(s).   Debra Ludwig, MD 07/29/2018, 12:55 PM Pager: Pager# [phone]

## 2018-07-29 NOTE — Care Management (Addendum)
Pt will d/c tonight after IV abx dose.  D/W Jermaine from [facility].  Pt's med will be delivered from [facility] to her home tomorrow and she will administer her own IV abx dose tomorrow evening.  [facility] RN will visit on Tuesday 9/3.    Pt was given a small vial of 70/30 from the hospital to cover her until she can get insulin from [facility] on Tuesday.  Patient states she pays $4/each for insulin prescriptions, as she is a IM patient.  Pt states she does not need further assistance with prescriptions.   RN states patient has not used O2 at all in the last couple of days.  No need for new home O2 order.

## 2018-07-29 NOTE — Progress Notes (Signed)
PHARMACY CONSULT NOTE FOR:  OUTPATIENT  PARENTERAL ANTIBIOTIC THERAPY (OPAT)  Indication: Osteomyelitis Regimen: ertapenem 1g IV Q24hrs End date: 09/07/2018  IV antibiotic discharge orders are pended. To discharging provider:  please sign these orders via discharge navigator,  Select New Orders & click on the button choice - Manage This Unsigned Work.     Thank you for allowing pharmacy to be a part of this patient's care.  Burnard Leigh Makhlouf 07/29/2018, 1:50 PM

## 2018-07-29 NOTE — Progress Notes (Signed)
[name], female   DOB: [birth year], 57 y.o.   MRN: [id number] PICCU line being placed for treatment of right calcaneal osteomyelitis. Patient did not want to undergo BKA. Right Short leg dressing intact.

## 2018-07-29 NOTE — Progress Notes (Signed)
While attempting to access basilic vein for PICC placement, blood return was vigorous. Introducer placed to insert PICC and inner cannula was removed. Non-pulsatile, but forceful blood return noted. Access of an artery in question. Therefore, all access removed. Tourniquet removed. Pressure applied for approximately ten minutes. No further bleeding noted. Basilic vein accessed by Jake Samples, RN, distal to initial site without complications. End of procedure, no bruising noted, no bleeding, no swelling noted, and no change arm circulation from original stick. Patient without complaints.Pressure dressing applied to site. RN notified and patient aware of the above.

## 2018-07-29 NOTE — Progress Notes (Signed)
[name], female   DOB: 1960-12-02, 57 y.o.   MRN: [id number]         [facility] for Infectious Disease  Date of Admission:  07/26/2018           Day 3 ertapenem ASSESSMENT: She has left second metatarsal osteomyelitis with a small plantar abscess complicating a chronic, recurrent plantar ulcer.  This is most likely polymicrobial.  Her culture has grown group B strep but she probably also has gram-negative rods and anaerobes.  I plan on 6 weeks of IV ertapenem.  PLAN: 1. Continue ertapenem  2. I will sign off now and arrange follow-up in my clinic  Diagnosis: Left foot osteomyelitis  Culture Result: Group B strep  Allergies  Allergen Reactions  . Aspirin Nausea And Vomiting  . Ibuprofen Nausea And Vomiting and Other (See Comments)    Abdominal pain  . Penicillins Other (See Comments)    unknown    OPAT Orders Discharge antibiotics: Per pharmacy protocol ertapenem  Duration: 6 weeks End Date: 09/07/2018  [facility] Care Per Protocol:  Labs weekly while on IV antibiotics: _x_ CBC with differential _x_ BMP __ CMP _x_ CRP _x_ ESR __ Vancomycin trough  __ Please pull PIC at completion of IV antibiotics _x_ Please leave PIC in place until doctor has seen patient or been notified  Fax weekly labs to [phone]  Clinic Follow Up Appt: 09/06/2018  Principal Problem:   Osteomyelitis ([place]) Active Problems:   Hypertension associated with diabetes ([place])   Diabetes ([place])   Scheduled Meds: . atorvastatin  40 mg Oral Daily  . enoxaparin (LOVENOX) injection  40 mg Subcutaneous Q24H  . gabapentin  300 mg Oral TID  . insulin aspart  0-15 Units Subcutaneous TID WC  . insulin aspart  0-5 Units Subcutaneous QHS  . insulin aspart  8 Units Subcutaneous TID WC  . insulin glargine  40 Units Subcutaneous Daily  . Melatonin  6 mg Oral Once  . pantoprazole  40 mg Oral Daily  . ramelteon  8 mg Oral QHS  . sodium chloride flush  10-40 mL Intracatheter  Q12H   Continuous Infusions: . ertapenem 1,000 mg (07/28/18 2110)   PRN Meds:.acetaminophen, methocarbamol, sodium chloride flush   SUBJECTIVE: She is without complaint.  Review of Systems: Review of Systems  Constitutional: Negative for chills, diaphoresis and fever.  Gastrointestinal: Negative for abdominal pain, diarrhea, nausea and vomiting.  Musculoskeletal: Negative for joint pain.    Allergies  Allergen Reactions  . Aspirin Nausea And Vomiting  . Ibuprofen Nausea And Vomiting and Other (See Comments)    Abdominal pain  . Penicillins Other (See Comments)    unknown    OBJECTIVE: Vitals:   07/27/18 2137 07/28/18 0428 07/28/18 1229 07/28/18 2020  BP: 137/79 (!) 151/91 (!) 147/84 (!) 179/82  Pulse: 93 92 97 91  Resp:      Temp: 99.5 F (37.5 C) 98 F (36.7 C) 97.7 F (36.5 C) 98.1 F (36.7 C)  TempSrc: Oral  Oral Oral  SpO2: 99% 97%  98%  Weight:      Height:       Body mass index is 37.67 kg/m.  Physical Exam  Constitutional:  She is in good spirits sitting up on the side of her bed.  She is visiting with family.    Lab Results Lab Results  Component Value Date   WBC 5.3 07/28/2018   HGB 11.7 (L) 07/28/2018   HCT 37.3 07/28/2018  MCV 97.4 07/28/2018   PLT 203 07/28/2018    Lab Results  Component Value Date   CREATININE 0.70 07/28/2018   BUN 9 07/28/2018   NA 137 07/28/2018   K 4.3 07/28/2018   CL 101 07/28/2018   CO2 27 07/28/2018    Lab Results  Component Value Date   ALT 27 08/29/2014   AST 25 08/29/2014   ALKPHOS 111 08/29/2014   BILITOT 0.3 08/29/2014     Microbiology: Recent Results (from the past 240 hour(s))  Culture, blood (routine x 2)     Status: None (Preliminary result)   Collection Time: 07/27/18 11:45 AM  Result Value Ref Range Status   Specimen Description BLOOD RIGHT ANTECUBITAL  Final   Special Requests   Final    BOTTLES DRAWN AEROBIC AND ANAEROBIC Blood Culture adequate volume   Culture   Final    NO GROWTH  2 DAYS Performed at [facility], [place] [address], [place], [place] [address]    Report Status PENDING  Incomplete  Culture, blood (routine x 2)     Status: None (Preliminary result)   Collection Time: 07/27/18 12:00 PM  Result Value Ref Range Status   Specimen Description BLOOD RIGHT ANTECUBITAL  Final   Special Requests   Final    BOTTLES DRAWN AEROBIC AND ANAEROBIC Blood Culture adequate volume   Culture   Final    NO GROWTH 2 DAYS Performed at [facility], [place] [address], [place], [place] [address]    Report Status PENDING  Incomplete  Aerobic/Anaerobic Culture (surgical/deep wound)     Status: None (Preliminary result)   Collection Time: 07/27/18  6:00 PM  Result Value Ref Range Status   Specimen Description WOUND FOOT LEFT  Final   Special Requests NONE  Final   Gram Stain   Final    RARE WBC PRESENT, PREDOMINANTLY PMN FEW GRAM POSITIVE COCCI IN PAIRS Performed at [facility], [place] [address] [place], [place] [address]    Culture   Final    ABUNDANT GROUP B STREP(S.AGALACTIAE)ISOLATED TESTING AGAINST S. AGALACTIAE NOT ROUTINELY PERFORMED DUE TO PREDICTABILITY OF AMP/PEN/VAN SUSCEPTIBILITY. NO ANAEROBES ISOLATED; CULTURE IN PROGRESS FOR 5 DAYS    Report Status PENDING  Incomplete  Surgical pcr screen     Status: None   Collection Time: 07/27/18  6:00 PM  Result Value Ref Range Status   MRSA, PCR NEGATIVE NEGATIVE Final   Staphylococcus aureus NEGATIVE NEGATIVE Final    Comment: (NOTE) The Xpert SA Assay (FDA approved for NASAL specimens in patients 51 years of age and older), is one component of a comprehensive surveillance program. It is not intended to diagnose infection nor to guide or monitor treatment. Performed at [facility], [place] [address] [place], [place] [address]     Michel Bickers, [place] for Infectious [place] [facility] [phone] pager   [phone] cell 07/29/2018, 2:26 PM

## 2018-07-29 NOTE — Progress Notes (Addendum)
Spoke with Rodena Piety RN re PICC placement.  States has PIV working well and no plans for d/c home at this time.  Rodena Piety also states no reason per pt for Left arm placement vs right arm.  Will notify prior to arrival.

## 2018-07-31 MED FILL — UNIFINE PENTIPS 31GX3/16": 31G X 5 MM | 30 days supply | Qty: 100 | Fill #0

## 2018-07-31 MED FILL — NOVOLOG FLEXPEN SYRINGE: 100 | 38 days supply | Qty: 15 | Fill #0

## 2018-07-31 MED FILL — LEVEMIR FLEXTOUCH 100 UNITS: 100 | 38 days supply | Qty: 15 | Fill #0

## 2018-07-31 MED FILL — UNIFINE PENTIPS 31GX3/16: 31G X 5 MM | 30 days supply | Qty: 100 | Fill #0

## 2018-08-01 LAB — CULTURE, BLOOD (ROUTINE X 2)
CULTURE: NO GROWTH
Culture: NO GROWTH
Special Requests: ADEQUATE
Special Requests: ADEQUATE

## 2018-08-01 LAB — AEROBIC/ANAEROBIC CULTURE W GRAM STAIN (SURGICAL/DEEP WOUND)

## 2018-08-01 LAB — AEROBIC/ANAEROBIC CULTURE (SURGICAL/DEEP WOUND)

## 2018-08-06 ENCOUNTER — Other Ambulatory Visit: Payer: Self-pay

## 2018-08-06 ENCOUNTER — Ambulatory Visit (INDEPENDENT_AMBULATORY_CARE_PROVIDER_SITE_OTHER): Payer: No Typology Code available for payment source | Admitting: Internal Medicine

## 2018-08-06 VITALS — BP 142/85 | HR 89 | Temp 97.8°F | Ht 73.0 in | Wt 284.2 lb

## 2018-08-06 DIAGNOSIS — Z89422 Acquired absence of other left toe(s): Secondary | ICD-10-CM

## 2018-08-06 DIAGNOSIS — I10 Essential (primary) hypertension: Secondary | ICD-10-CM

## 2018-08-06 DIAGNOSIS — Z89412 Acquired absence of left great toe: Secondary | ICD-10-CM

## 2018-08-06 DIAGNOSIS — L97529 Non-pressure chronic ulcer of other part of left foot with unspecified severity: Secondary | ICD-10-CM

## 2018-08-06 DIAGNOSIS — Z794 Long term (current) use of insulin: Secondary | ICD-10-CM

## 2018-08-06 DIAGNOSIS — Z95828 Presence of other vascular implants and grafts: Secondary | ICD-10-CM

## 2018-08-06 DIAGNOSIS — L03116 Cellulitis of left lower limb: Secondary | ICD-10-CM

## 2018-08-06 DIAGNOSIS — B9689 Other specified bacterial agents as the cause of diseases classified elsewhere: Secondary | ICD-10-CM

## 2018-08-06 DIAGNOSIS — Z79899 Other long term (current) drug therapy: Secondary | ICD-10-CM

## 2018-08-06 DIAGNOSIS — M869 Osteomyelitis, unspecified: Secondary | ICD-10-CM

## 2018-08-06 DIAGNOSIS — E11621 Type 2 diabetes mellitus with foot ulcer: Secondary | ICD-10-CM

## 2018-08-06 DIAGNOSIS — E1142 Type 2 diabetes mellitus with diabetic polyneuropathy: Secondary | ICD-10-CM

## 2018-08-06 DIAGNOSIS — Z8632 Personal history of gestational diabetes: Secondary | ICD-10-CM

## 2018-08-06 DIAGNOSIS — E118 Type 2 diabetes mellitus with unspecified complications: Secondary | ICD-10-CM

## 2018-08-06 DIAGNOSIS — E1169 Type 2 diabetes mellitus with other specified complication: Secondary | ICD-10-CM

## 2018-08-06 NOTE — Patient Instructions (Addendum)
It was nice seeing you today!  Please come back next week with your glucometer so we can further adjust your insulin if needed. Check your sugar first thing in the morning and 3-4 times throughout the day (a couple right before meals and a couple 2 hours after meals). Also check it if you feel your sugar might be too low.

## 2018-08-06 NOTE — Progress Notes (Signed)
   CC: diabetic foot infection  HPI:  [name] is a 57 y.o. with a PMH of T2DM, HTN, presenting to clinic for hospital follow up for diabetic foot infection with osteomyelitis.  Patient admitted from 8/29-07/29/18 for left second metatarsal head osteomyelitis with overlying cellulitis with wound cultures growing GBS; due to polymicrobial aspect of diabetic foot infections she was placed on ertapenem; patient declined amputation which was recommendation from orthopedics, rather she wanted to attempt medical therapy first. She will be on ertapenem for total of 6wks with prelim end date 09/09/18.   Since coming home from the hospital, she endorses continued improvement in her foot in terms of swelling, pain and drainage. She changes her bandage about every 2 days and has noticed less and less drainage every time. She denies fevers, chills, nausea, vomiting. She feels she is doing well with ertapenem infusions.   Patient also has uncontrolled T2DM (diagnosed 1yrs ago while she was pregnant with her son). Prior to hospitalization she was on Novolog mix 70/30 and plan was to transition her to long acting and mealtime insulin for better glucose control. Patient states that since she had leftover Novolog mix, she has continued taking it and thinks she will run out in about 2 days at which time she plans on starting Lantus 40 units qhs and Novolog 15 units TID with meals. Since starting antibiotics she notes significant improvement in her glucose control on 55 units BID of 70/30; fasting AM CBG is 120-130 and 1hr post-prandial is ~210. She feels improvement in her energy level.   She states that in the past, whenever she would take more than 15 units of aspart insulin she would have significant muscle cramps; she has found that they get worse if she is taking hydrochlorothiazide. She treats these with OTC potassium supplements and takes 3-4 about twice a day; last time she had any was a week ago. She  expects that they will return once she starts taking aspart again. She is unable to tolerate mustard or pickle juice.  Please see problem based Assessment and Plan for status of patients chronic conditions.  Past Medical History:  Diagnosis Date  . Anemia   . Asthma 11/01/2010  . Diabetes mellitus    since 1983  . GERD (gastroesophageal reflux disease)   . Hypertension   . Osteomyelitis ([place])   . Pneumonia    as a child    Review of Systems:   Per HPI  Physical Exam:  Vitals:   08/06/18 1033  BP: (!) 142/85  Pulse: 89  Temp: 97.8 F (36.6 C)  TempSrc: Oral  SpO2: 100%  Weight: 284 lb 3.2 oz (128.9 kg)  Height: 6\' 1"  (1.854 m)   GENERAL- alert, co-operative, appears as stated age, not in any distress. HEENT- oral mucosa appears moist. CARDIAC- RRR, no murmurs, rubs or gallops. RESP- Moving equal volumes of air, and clear to auscultation bilaterally, no wheezes or crackles. ABDOMEN- Soft, nontender, bowel sounds present. 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  Assessment & Plan:   See Encounters Tab for problem based charting.   Patient discussed with Dr. Moshe Cipro, MD Internal Medicine PGY-3

## 2018-08-06 NOTE — Assessment & Plan Note (Signed)
Patient still taking Novolog 70/30 mix 55 units BID until she runs out (about 2 more days); she notes improvement in glycemic control since starting antibiotics.   Plan: --when she runs out of 70/30, switch to Lantus 40 units qhs and Novolog 15 units TID with meals; advised to check CBGs 4-5 times per day and bring in glucometer in 1 week for further titration --advised to try mustard or pickle juice for cramps and risk of hyperkalemia with supplementation; she has normal renal function and should be able to clear the extra potassium, however if she is taking it again we will check Bmet at f/u visit in 1 wk.

## 2018-08-06 NOTE — Assessment & Plan Note (Signed)
Patient admitted from 8/29-07/29/18 for left second metatarsal head osteomyelitis with overlying cellulitis with wound cultures growing GBS; due to polymicrobial aspect of diabetic foot infections she was placed on ertapenem; patient declined amputation which was recommendation from orthopedics, rather she wanted to attempt medical therapy first. She will be on ertapenem for total of 6wks with prelim end date 09/09/18.   Since coming home from the hospital, she endorses continued improvement in her foot in terms of swelling, pain and drainage. She changes her bandage about every 2 days and has noticed less and less drainage every time. No systemic s/sx.  On exam she has ulcer on plantar surface of left 2nd metatarsal head with small amount of drainage on bandage only. No evidence of surrounding cellulitis.  Plan: --continue ertapenem for 6wks --f/u with [facility]

## 2018-08-09 NOTE — Progress Notes (Signed)
Internal Medicine Clinic Attending  Case discussed with Dr. Svalina at the time of the visit.  We reviewed the resident's history and exam and pertinent patient test results.  I agree with the assessment, diagnosis, and plan of care documented in the resident's note.  Alexander Raines, M.D., Ph.D.  

## 2018-08-13 ENCOUNTER — Ambulatory Visit: Payer: No Typology Code available for payment source

## 2018-08-13 ENCOUNTER — Ambulatory Visit: Payer: No Typology Code available for payment source | Admitting: Dietician

## 2018-08-14 ENCOUNTER — Encounter (HOSPITAL_BASED_OUTPATIENT_CLINIC_OR_DEPARTMENT_OTHER): Payer: No Typology Code available for payment source | Attending: Internal Medicine

## 2018-08-14 ENCOUNTER — Encounter (HOSPITAL_BASED_OUTPATIENT_CLINIC_OR_DEPARTMENT_OTHER): Payer: Self-pay

## 2018-08-14 DIAGNOSIS — L97523 Non-pressure chronic ulcer of other part of left foot with necrosis of muscle: Secondary | ICD-10-CM | POA: Insufficient documentation

## 2018-08-14 DIAGNOSIS — M86172 Other acute osteomyelitis, left ankle and foot: Secondary | ICD-10-CM | POA: Insufficient documentation

## 2018-08-14 DIAGNOSIS — E11621 Type 2 diabetes mellitus with foot ulcer: Secondary | ICD-10-CM | POA: Insufficient documentation

## 2018-08-14 DIAGNOSIS — I10 Essential (primary) hypertension: Secondary | ICD-10-CM | POA: Insufficient documentation

## 2018-08-14 DIAGNOSIS — E1142 Type 2 diabetes mellitus with diabetic polyneuropathy: Secondary | ICD-10-CM | POA: Insufficient documentation

## 2018-08-14 DIAGNOSIS — E1165 Type 2 diabetes mellitus with hyperglycemia: Secondary | ICD-10-CM | POA: Insufficient documentation

## 2018-08-14 DIAGNOSIS — E1169 Type 2 diabetes mellitus with other specified complication: Secondary | ICD-10-CM | POA: Insufficient documentation

## 2018-08-14 DIAGNOSIS — B951 Streptococcus, group B, as the cause of diseases classified elsewhere: Secondary | ICD-10-CM | POA: Insufficient documentation

## 2018-08-14 DIAGNOSIS — L02612 Cutaneous abscess of left foot: Secondary | ICD-10-CM | POA: Insufficient documentation

## 2018-08-14 DIAGNOSIS — E1140 Type 2 diabetes mellitus with diabetic neuropathy, unspecified: Secondary | ICD-10-CM | POA: Insufficient documentation

## 2018-08-14 DIAGNOSIS — Z794 Long term (current) use of insulin: Secondary | ICD-10-CM | POA: Insufficient documentation

## 2018-08-16 ENCOUNTER — Telehealth: Payer: Self-pay | Admitting: Internal Medicine

## 2018-08-16 DIAGNOSIS — E1165 Type 2 diabetes mellitus with hyperglycemia: Secondary | ICD-10-CM

## 2018-08-16 DIAGNOSIS — Z794 Long term (current) use of insulin: Principal | ICD-10-CM

## 2018-08-16 DIAGNOSIS — E118 Type 2 diabetes mellitus with unspecified complications: Principal | ICD-10-CM

## 2018-08-16 DIAGNOSIS — IMO0002 Reserved for concepts with insufficient information to code with codable children: Secondary | ICD-10-CM

## 2018-08-17 NOTE — Telephone Encounter (Signed)
refilled 

## 2018-08-20 MED FILL — CONTOUR NEXT STRIPS: 25 days supply | Qty: 100 | Fill #0

## 2018-08-20 NOTE — Telephone Encounter (Signed)
Received fax from [place] asking if this Rx is part of IM Program. If yes, please resend with that notation in comments. Hubbard Hartshorn, RN, BSN

## 2018-08-21 MED ORDER — GLUCOSE BLOOD VI STRP: 1.0000 | ORAL_STRIP | Freq: Four times a day (QID) | 11 refills | Status: DC

## 2018-08-21 MED FILL — CONTOUR NEXT STRIPS: 25 days supply | Qty: 100 | Fill #0

## 2018-08-21 NOTE — Addendum Note (Signed)
Addended by: Guadlupe Spanish B on: 08/21/2018 01:54 PM   Modules accepted: Orders

## 2018-08-21 NOTE — Telephone Encounter (Signed)
Sent with IM program added

## 2018-08-24 ENCOUNTER — Ambulatory Visit: Payer: No Typology Code available for payment source

## 2018-08-30 ENCOUNTER — Encounter (HOSPITAL_BASED_OUTPATIENT_CLINIC_OR_DEPARTMENT_OTHER): Payer: Self-pay | Attending: Internal Medicine

## 2018-08-30 DIAGNOSIS — I10 Essential (primary) hypertension: Secondary | ICD-10-CM | POA: Insufficient documentation

## 2018-08-30 DIAGNOSIS — L97522 Non-pressure chronic ulcer of other part of left foot with fat layer exposed: Secondary | ICD-10-CM | POA: Insufficient documentation

## 2018-08-30 DIAGNOSIS — E1142 Type 2 diabetes mellitus with diabetic polyneuropathy: Secondary | ICD-10-CM | POA: Insufficient documentation

## 2018-08-30 DIAGNOSIS — E11621 Type 2 diabetes mellitus with foot ulcer: Secondary | ICD-10-CM | POA: Insufficient documentation

## 2018-09-06 ENCOUNTER — Encounter: Payer: Self-pay | Admitting: Internal Medicine

## 2018-09-06 ENCOUNTER — Telehealth: Payer: Self-pay | Admitting: Pharmacist

## 2018-09-06 ENCOUNTER — Ambulatory Visit (INDEPENDENT_AMBULATORY_CARE_PROVIDER_SITE_OTHER): Payer: Self-pay | Admitting: Internal Medicine

## 2018-09-06 DIAGNOSIS — M869 Osteomyelitis, unspecified: Secondary | ICD-10-CM

## 2018-09-06 DIAGNOSIS — E1169 Type 2 diabetes mellitus with other specified complication: Secondary | ICD-10-CM

## 2018-09-06 MED ORDER — ERTAPENEM IV (FOR PTA / DISCHARGE USE ONLY): 1.0000 g | INTRAVENOUS | Status: AC

## 2018-09-06 NOTE — Telephone Encounter (Signed)
Called and spoke to [place] at [facility] and gave verbal order per Dr. Megan Salon to pull patient's PICC line tomorrow afternoon. Debbie verbalized understanding.

## 2018-09-06 NOTE — Progress Notes (Signed)
[facility] for Infectious Disease  Patient Active Problem List   Diagnosis Date Noted  . Diabetic osteomyelitis ([place]) 11/28/1988    Priority: High  . Diabetes ([place]) 07/26/2018  . Long term current use of opiate analgesic 01/31/2017  . Opioid dependence ([place]) 11/27/2016  . Morton neuroma 11/02/2016  . Broken teeth 11/02/2016  . Left shoulder pain 05/05/2016  . Left elbow pain 05/05/2016  . Hyperlipidemia associated with type 2 diabetes mellitus ([place]) 04/22/2016  . Severe obesity (BMI 35.0-39.9) 01/22/2016  . GERD (gastroesophageal reflux disease) 03/03/2014  . Health care maintenance 01/06/2014  . Elevated TSH 05/13/2013  . Wrist pain 04/11/2013  . BPPV (benign paroxysmal positional vertigo) 03/07/2013  . Neuropathy in diabetes ([place]) 11/01/2010  . ASTHMA 11/01/2010  . Hypertension associated with diabetes ([place]) 11/28/2008    Patient's Medications  New Prescriptions   No medications on file  Previous Medications   ATORVASTATIN (LIPITOR) 40 MG TABLET    Take 1 tablet (40 mg total) by mouth daily.   BLOOD GLUCOSE METER KIT AND SUPPLIES    Dispense based on patient and insurance preference. Use up to four times daily as directed.   BLOOD GLUCOSE METER KIT AND SUPPLIES KIT    Dispense based on patient and insurance preference. Use up to four times daily as directed. (FOR ICD-9 250.00, 250.01).   BLOOD GLUCOSE MONITORING SUPPL (TRUE METRIX METER) DEVI    1 Device by Does not apply route 2 (two) times daily.   EXENATIDE (BYETTA 10 MCG PEN) 10 MCG/0.04ML SOPN INJECTION    Inject 0.04 mLs (10 mcg total) into the skin 2 (two) times daily with a meal.   GABAPENTIN (NEURONTIN) 300 MG CAPSULE    Take 1 capsule (300 mg total) by mouth 3 (three) times daily.   GLUCOSE BLOOD (CONTOUR NEXT TEST) TEST STRIP    1 each by Other route every 6 (six) hours. Use as instructed   INSULIN ASPART (NOVOLOG) 100 UNIT/ML FLEXPEN    Inject 13 Units into the skin 3 (three) times daily with  meals.   INSULIN ASPART PROTAMINE- ASPART (NOVOLOG MIX 70/30) (70-30) 100 UNIT/ML INJECTION    Inject 0.55 mLs (55 Units total) into the skin 2 (two) times daily with a meal.   INSULIN DETEMIR (LEVEMIR) 100 UNIT/ML PEN    Inject 40 Units into the skin at bedtime.   INSULIN PEN NEEDLE 29G X 5MM MISC    Inject under the skin as prescribed.   INSULIN SYRINGES, DISPOSABLE, U-100 1 ML MISC    Inject 0.55 mLs into the skin 2 (two) times daily with a meal.   LANCET DEVICES (AUTO-LANCET) MISC    Prick finger for BG check as prescribed.   LANCETS MISC    1 each by Does not apply route 4 (four) times daily -  before meals and at bedtime.   LISINOPRIL (PRINIVIL,ZESTRIL) 5 MG TABLET    Take 1 tablet (5 mg total) by mouth at bedtime. IM PROGRAM   METFORMIN (GLUCOPHAGE) 1000 MG TABLET    TAKE 1 Tablet  BY MOUTH TWICE DAILY WITH A MEAL   MULTIPLE VITAMIN (MULTIVITAMIN WITH MINERALS) TABS TABLET    Take 1 tablet by mouth daily as needed.    OMEPRAZOLE (PRILOSEC) 20 MG CAPSULE    Take 1 capsule (20 mg total) by mouth daily.  Modified Medications   Modified Medication Previous Medication   ERTAPENEM (INVANZ) IVPB ertapenem (INVANZ) IVPB  Inject 1 g into the vein daily for 1 day. Indication:  Osteomyelitis  Last Day of Therapy:  09/07/2018 Labs - Once weekly:  CBC/D and BMP, Labs - Every other week:  ESR and CRP    Inject 1 g into the vein daily. Indication:  Osteomyelitis  Last Day of Therapy:  09/07/2018 Labs - Once weekly:  CBC/D and BMP, Labs - Every other week:  ESR and CRP  Discontinued Medications   No medications on file    Subjective: [name] is in for her routine hospital follow-up visit.  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  She was hospitalized in late August with a small plantar abscess and second metatarsal osteomyelitis.  Cultures grew group B strep.  She was discharged on IV ertapenem and will complete 6 weeks of therapy  tomorrow.  She has had no problems tolerating her PICC or ertapenem.  She is being followed by Dr. Dellia Nims at the wound center who has told her that the ulcer is improving.  Review of Systems: Review of Systems  Constitutional: Negative for chills, diaphoresis and fever.  Gastrointestinal: Negative for abdominal pain, diarrhea, nausea and vomiting.    Past Medical History:  Diagnosis Date  . Anemia   . Asthma 11/01/2010  . Diabetes mellitus    since 1983  . GERD (gastroesophageal reflux disease)   . Hypertension   . Osteomyelitis ([place])   . Pneumonia    as a child    Social History   Tobacco Use  . Smoking status: Never Smoker  . Smokeless tobacco: Never Used  Substance Use Topics  . Alcohol use: No    Alcohol/week: 0.0 standard drinks  . Drug use: No    Family History  Problem Relation Age of Onset  . Diabetes Father   . Hypertension Father   . Hypertension Mother   . Dementia Mother   . Diabetes Daughter     Allergies  Allergen Reactions  . Aspirin Nausea And Vomiting  . Ibuprofen Nausea And Vomiting and Other (See Comments)    Abdominal pain  . Penicillins Other (See Comments)    unknown    Objective: Vitals:   09/06/18 1115  BP: 131/85  Pulse: 91  Temp: 98.1 F (36.7 C)  TempSrc: Oral  Weight: 287 lb (130.2 kg)  Height: '6\' 1"'  (1.854 m)   Body mass index is 37.87 kg/m.  Physical Exam  Constitutional: She is oriented to person, place, and time.  She is in very good spirits.  Musculoskeletal:  She has a 10 mm ulcer on the plantar surface of her foot.  There is no surrounding cellulitis or fluctuance.  There is surrounding callus.  There is only scant drainage and no odor.  There is no exposed bone.  Neurological: She is alert and oriented to person, place, and time.  Skin: No rash noted.  Left arm PICC site looks good.  Psychiatric: She has a normal mood and affect.      Lab Results    Problem List Items Addressed This Visit    None        Michel Bickers, MD [facility] for [place] [phone] pager   [phone] cell 09/06/2018, 11:47 AM

## 2018-09-06 NOTE — Assessment & Plan Note (Signed)
She is improving on therapy for left foot osteomyelitis.  She will complete her ertapenem tomorrow and have her PICC removed.  She will follow-up here in 4 weeks.

## 2018-09-11 ENCOUNTER — Telehealth: Payer: Self-pay

## 2018-09-11 NOTE — Telephone Encounter (Signed)
Needs to speak with a nurse about meds. Please call pt back.  

## 2018-09-12 MED FILL — NOVOLOG FLEXPEN SYRINGE: 100 | 38 days supply | Qty: 15 | Fill #1

## 2018-09-12 MED FILL — CONTOUR NEXT STRIPS: 25 days supply | Qty: 100 | Fill #1

## 2018-09-12 MED FILL — LEVEMIR FLEXTOUCH 100 UNITS: 100 | 38 days supply | Qty: 15 | Fill #1

## 2018-09-12 NOTE — Telephone Encounter (Signed)
Called pt - stated she's here at the hospital, she will come down to the clinic. Pt arrived at the clinic - stated she cannot afford her meds esp diabetic ones including insulin; she has been out of work d/t her foot ulcer, recently in the [place]. Stated she has been out of insulin x 1 week. Mannie Stabile stated she will talk to her; she has tried to help pt several times before.

## 2018-09-18 ENCOUNTER — Other Ambulatory Visit: Payer: Self-pay | Admitting: Internal Medicine

## 2018-09-18 DIAGNOSIS — K219 Gastro-esophageal reflux disease without esophagitis: Secondary | ICD-10-CM

## 2018-09-18 NOTE — Telephone Encounter (Signed)
refilled 

## 2018-09-28 ENCOUNTER — Encounter (HOSPITAL_BASED_OUTPATIENT_CLINIC_OR_DEPARTMENT_OTHER): Payer: Self-pay

## 2018-09-28 ENCOUNTER — Encounter (HOSPITAL_BASED_OUTPATIENT_CLINIC_OR_DEPARTMENT_OTHER): Payer: Self-pay | Attending: Internal Medicine

## 2018-09-28 DIAGNOSIS — Z89412 Acquired absence of left great toe: Secondary | ICD-10-CM | POA: Insufficient documentation

## 2018-09-28 DIAGNOSIS — Z794 Long term (current) use of insulin: Secondary | ICD-10-CM | POA: Insufficient documentation

## 2018-09-28 DIAGNOSIS — I10 Essential (primary) hypertension: Secondary | ICD-10-CM | POA: Insufficient documentation

## 2018-09-28 DIAGNOSIS — L97522 Non-pressure chronic ulcer of other part of left foot with fat layer exposed: Secondary | ICD-10-CM | POA: Insufficient documentation

## 2018-09-28 DIAGNOSIS — E11621 Type 2 diabetes mellitus with foot ulcer: Secondary | ICD-10-CM | POA: Insufficient documentation

## 2018-09-28 DIAGNOSIS — Z89422 Acquired absence of other left toe(s): Secondary | ICD-10-CM | POA: Insufficient documentation

## 2018-09-28 DIAGNOSIS — E1142 Type 2 diabetes mellitus with diabetic polyneuropathy: Secondary | ICD-10-CM | POA: Insufficient documentation

## 2018-10-02 ENCOUNTER — Ambulatory Visit (INDEPENDENT_AMBULATORY_CARE_PROVIDER_SITE_OTHER): Payer: Self-pay | Admitting: Internal Medicine

## 2018-10-02 ENCOUNTER — Encounter: Payer: Self-pay | Admitting: Internal Medicine

## 2018-10-02 ENCOUNTER — Ambulatory Visit (INDEPENDENT_AMBULATORY_CARE_PROVIDER_SITE_OTHER): Payer: Self-pay

## 2018-10-02 DIAGNOSIS — E1142 Type 2 diabetes mellitus with diabetic polyneuropathy: Secondary | ICD-10-CM

## 2018-10-02 DIAGNOSIS — M869 Osteomyelitis, unspecified: Secondary | ICD-10-CM

## 2018-10-02 DIAGNOSIS — Z23 Encounter for immunization: Secondary | ICD-10-CM

## 2018-10-02 DIAGNOSIS — E1169 Type 2 diabetes mellitus with other specified complication: Secondary | ICD-10-CM

## 2018-10-02 DIAGNOSIS — Z794 Long term (current) use of insulin: Secondary | ICD-10-CM

## 2018-10-02 MED ORDER — TUBERCULIN PPD 5 UNIT/0.1ML ID SOLN
5.0000 [IU] | Freq: Once | INTRADERMAL | Status: AC
  Administered 2018-10-02: 5 [IU] via INTRADERMAL

## 2018-10-02 NOTE — Assessment & Plan Note (Signed)
I am quite hopeful that her osteomyelitis has been cured.  She can follow-up here as needed.  She will continue regular visits to the wound center.

## 2018-10-02 NOTE — Progress Notes (Signed)
[facility] for Infectious Disease  Patient Active Problem List   Diagnosis Date Noted  . Diabetic osteomyelitis ([place]) 11/28/1988    Priority: High  . Diabetes ([place]) 07/26/2018  . Long term current use of opiate analgesic 01/31/2017  . Opioid dependence ([place]) 11/27/2016  . Morton neuroma 11/02/2016  . Broken teeth 11/02/2016  . Left shoulder pain 05/05/2016  . Left elbow pain 05/05/2016  . Hyperlipidemia associated with type 2 diabetes mellitus ([place]) 04/22/2016  . Severe obesity (BMI 35.0-39.9) 01/22/2016  . GERD (gastroesophageal reflux disease) 03/03/2014  . Health care maintenance 01/06/2014  . Elevated TSH 05/13/2013  . Wrist pain 04/11/2013  . BPPV (benign paroxysmal positional vertigo) 03/07/2013  . Neuropathy in diabetes ([place]) 11/01/2010  . ASTHMA 11/01/2010  . Hypertension associated with diabetes ([place]) 11/28/2008    Patient's Medications  New Prescriptions   No medications on file  Previous Medications   ATORVASTATIN (LIPITOR) 40 MG TABLET    Take 1 tablet (40 mg total) by mouth daily.   BLOOD GLUCOSE METER KIT AND SUPPLIES    Dispense based on patient and insurance preference. Use up to four times daily as directed.   BLOOD GLUCOSE METER KIT AND SUPPLIES KIT    Dispense based on patient and insurance preference. Use up to four times daily as directed. (FOR ICD-9 250.00, 250.01).   BLOOD GLUCOSE MONITORING SUPPL (TRUE METRIX METER) DEVI    1 Device by Does not apply route 2 (two) times daily.   EXENATIDE (BYETTA 10 MCG PEN) 10 MCG/0.04ML SOPN INJECTION    Inject 0.04 mLs (10 mcg total) into the skin 2 (two) times daily with a meal.   GABAPENTIN (NEURONTIN) 300 MG CAPSULE    Take 1 capsule (300 mg total) by mouth 3 (three) times daily.   GLUCOSE BLOOD (CONTOUR NEXT TEST) TEST STRIP    1 each by Other route every 6 (six) hours. Use as instructed   INSULIN ASPART (NOVOLOG) 100 UNIT/ML FLEXPEN    Inject 13 Units into the skin 3 (three) times daily with  meals.   INSULIN ASPART PROTAMINE- ASPART (NOVOLOG MIX 70/30) (70-30) 100 UNIT/ML INJECTION    Inject 0.55 mLs (55 Units total) into the skin 2 (two) times daily with a meal.   INSULIN DETEMIR (LEVEMIR) 100 UNIT/ML PEN    Inject 40 Units into the skin at bedtime.   INSULIN PEN NEEDLE 29G X 5MM MISC    Inject under the skin as prescribed.   INSULIN SYRINGES, DISPOSABLE, U-100 1 ML MISC    Inject 0.55 mLs into the skin 2 (two) times daily with a meal.   LANCET DEVICES (AUTO-LANCET) MISC    Prick finger for BG check as prescribed.   LANCETS MISC    1 each by Does not apply route 4 (four) times daily -  before meals and at bedtime.   LISINOPRIL (PRINIVIL,ZESTRIL) 5 MG TABLET    Take 1 tablet (5 mg total) by mouth at bedtime. IM PROGRAM   METFORMIN (GLUCOPHAGE) 1000 MG TABLET    TAKE 1 Tablet  BY MOUTH TWICE DAILY WITH A MEAL   MULTIPLE VITAMIN (MULTIVITAMIN WITH MINERALS) TABS TABLET    Take 1 tablet by mouth daily as needed.    OMEPRAZOLE (PRILOSEC) 20 MG CAPSULE    TAKE 1 CAPSULE BY MOUTH DAILY.  Modified Medications   No medications on file  Discontinued Medications   No medications on file    Subjective: [name]  is in for her routine follow-up visit.  She completed 6 weeks of IV ertapenem on 09/07/2018 for her left second metatarsal osteomyelitis.  She feels like she is continued to improve.  Review of Systems: Review of Systems  Constitutional: Negative for chills, diaphoresis and fever.  Gastrointestinal: Negative for abdominal pain, diarrhea, nausea and vomiting.  Musculoskeletal: Positive for joint pain.    Past Medical History:  Diagnosis Date  . Anemia   . Asthma 11/01/2010  . Diabetes mellitus    since 1983  . GERD (gastroesophageal reflux disease)   . Hypertension   . Osteomyelitis ([place])   . Pneumonia    as a child    Social History   Tobacco Use  . Smoking status: Never Smoker  . Smokeless tobacco: Never Used  Substance Use Topics  . Alcohol use: No     Alcohol/week: 0.0 standard drinks  . Drug use: No    Family History  Problem Relation Age of Onset  . Diabetes Father   . Hypertension Father   . Hypertension Mother   . Dementia Mother   . Diabetes Daughter     Allergies  Allergen Reactions  . Aspirin Nausea And Vomiting  . Ibuprofen Nausea And Vomiting and Other (See Comments)    Abdominal pain  . Penicillins Other (See Comments)    unknown    Objective: Vitals:   10/02/18 0955  BP: 132/81  Pulse: 96  Temp: 98.4 F (36.9 C)  Weight: 282 lb (127.9 kg)  Height: '6\' 1"'  (1.854 m)   Body mass index is 37.21 kg/m.  Physical Exam  Constitutional: She is oriented to person, place, and time.  She is in very good spirits today.  Musculoskeletal:  The ulcer on the plantar surface of her left foot underneath her second metatarsal head is much smaller.  There is no drainage or odor or surrounding cellulitis.  The swelling around the ulcer has decreased.  Neurological: She is alert and oriented to person, place, and time.  Skin: No rash noted.  Psychiatric: She has a normal mood and affect.      Lab Results    Problem List Items Addressed This Visit      High   Diabetic osteomyelitis ([place])    I am quite hopeful that her osteomyelitis has been cured.  She can follow-up here as needed.  She will continue regular visits to the wound center.          Michel Bickers, MD [facility] for [facility] [phone] pager   [phone] cell 10/02/2018, 10:07 AM

## 2018-10-03 MED FILL — CONTOUR NEXT STRIPS: 25 days supply | Qty: 100 | Fill #2

## 2018-10-04 NOTE — Progress Notes (Signed)
Pt returns for PPD Read-results negative. Pt given letter to show negative results.Despina Hidden Cassady11/7/20194:29 PM

## 2018-10-08 ENCOUNTER — Ambulatory Visit: Payer: Self-pay

## 2018-10-08 ENCOUNTER — Encounter: Payer: Self-pay | Admitting: Internal Medicine

## 2018-10-19 ENCOUNTER — Other Ambulatory Visit: Payer: Self-pay | Admitting: *Deleted

## 2018-10-19 ENCOUNTER — Other Ambulatory Visit: Payer: Self-pay | Admitting: Internal Medicine

## 2018-10-19 DIAGNOSIS — E118 Type 2 diabetes mellitus with unspecified complications: Principal | ICD-10-CM

## 2018-10-19 DIAGNOSIS — E1165 Type 2 diabetes mellitus with hyperglycemia: Secondary | ICD-10-CM

## 2018-10-19 DIAGNOSIS — Z794 Long term (current) use of insulin: Principal | ICD-10-CM

## 2018-10-19 DIAGNOSIS — K219 Gastro-esophageal reflux disease without esophagitis: Secondary | ICD-10-CM

## 2018-10-19 DIAGNOSIS — IMO0002 Reserved for concepts with insufficient information to code with codable children: Secondary | ICD-10-CM

## 2018-10-19 MED FILL — GABAPENTIN 300 MG CAPSULE: 300 | 30 days supply | Qty: 90 | Fill #0

## 2018-10-19 MED FILL — LEVEMIR FLEXTOUCH 100 UNITS: 100 | 30 days supply | Qty: 12 | Fill #2

## 2018-10-19 MED FILL — NOVOLOG FLEXPEN SYRINGE: 100 | 30 days supply | Qty: 12 | Fill #2

## 2018-10-19 NOTE — Telephone Encounter (Signed)
Please send new rx with "IM Program" if appropriate. Thanks

## 2018-10-19 NOTE — Telephone Encounter (Signed)
refilled 

## 2018-10-21 MED ORDER — OMEPRAZOLE 20 MG PO CPDR: 20.0000 mg | DELAYED_RELEASE_CAPSULE | Freq: Every day | ORAL | 6 refills | Status: DC

## 2018-10-21 NOTE — Telephone Encounter (Signed)
refilled 

## 2018-10-22 MED FILL — CONTOUR NEXT STRIPS: 25 days supply | Qty: 100 | Fill #3

## 2018-10-29 ENCOUNTER — Encounter (HOSPITAL_BASED_OUTPATIENT_CLINIC_OR_DEPARTMENT_OTHER): Payer: Self-pay | Attending: Internal Medicine

## 2018-10-29 DIAGNOSIS — L97522 Non-pressure chronic ulcer of other part of left foot with fat layer exposed: Secondary | ICD-10-CM | POA: Insufficient documentation

## 2018-10-29 DIAGNOSIS — I10 Essential (primary) hypertension: Secondary | ICD-10-CM | POA: Insufficient documentation

## 2018-10-29 DIAGNOSIS — E1142 Type 2 diabetes mellitus with diabetic polyneuropathy: Secondary | ICD-10-CM | POA: Insufficient documentation

## 2018-10-29 DIAGNOSIS — E11621 Type 2 diabetes mellitus with foot ulcer: Secondary | ICD-10-CM | POA: Insufficient documentation

## 2018-10-29 DIAGNOSIS — E1140 Type 2 diabetes mellitus with diabetic neuropathy, unspecified: Secondary | ICD-10-CM | POA: Insufficient documentation

## 2018-10-29 DIAGNOSIS — L84 Corns and callosities: Secondary | ICD-10-CM | POA: Insufficient documentation

## 2018-11-06 ENCOUNTER — Ambulatory Visit (INDEPENDENT_AMBULATORY_CARE_PROVIDER_SITE_OTHER): Payer: Self-pay | Admitting: Internal Medicine

## 2018-11-06 ENCOUNTER — Encounter: Payer: Self-pay | Admitting: Internal Medicine

## 2018-11-06 VITALS — BP 106/76 | HR 98 | Temp 98.0°F | Wt 285.1 lb

## 2018-11-06 DIAGNOSIS — M869 Osteomyelitis, unspecified: Secondary | ICD-10-CM

## 2018-11-06 DIAGNOSIS — E1169 Type 2 diabetes mellitus with other specified complication: Secondary | ICD-10-CM

## 2018-11-06 DIAGNOSIS — E1142 Type 2 diabetes mellitus with diabetic polyneuropathy: Secondary | ICD-10-CM

## 2018-11-06 DIAGNOSIS — I152 Hypertension secondary to endocrine disorders: Secondary | ICD-10-CM

## 2018-11-06 DIAGNOSIS — E1159 Type 2 diabetes mellitus with other circulatory complications: Secondary | ICD-10-CM

## 2018-11-06 DIAGNOSIS — Z89412 Acquired absence of left great toe: Secondary | ICD-10-CM

## 2018-11-06 DIAGNOSIS — Z89422 Acquired absence of other left toe(s): Secondary | ICD-10-CM

## 2018-11-06 DIAGNOSIS — I10 Essential (primary) hypertension: Secondary | ICD-10-CM

## 2018-11-06 DIAGNOSIS — Z794 Long term (current) use of insulin: Secondary | ICD-10-CM

## 2018-11-06 LAB — POCT GLYCOSYLATED HEMOGLOBIN (HGB A1C): Hemoglobin A1C: 9 % — AB (ref 4.0–5.6)

## 2018-11-06 LAB — GLUCOSE, CAPILLARY: Glucose-Capillary: 299 mg/dL — ABNORMAL HIGH (ref 70–99)

## 2018-11-06 NOTE — Assessment & Plan Note (Signed)
Assessment: Diabetic osteomyelitis Patient has completed a 6 week course of IV ertapenem on 09/07/2018 for her left second metatarsal osteomyelitis. Her wound today appears well-healed with no drainage.  Patient follows with the wound center.  Plan -Monitor and continue wound care at the [facility] - re-bandaged wound in office

## 2018-11-06 NOTE — Assessment & Plan Note (Addendum)
Assessment: Essential hypertension Patient states that blood pressure readings at home are systolics below 940. She has taken herself off lisinopril and hydrochlorothiazide.  Today's blood pressure is 139/67 and on repeat was 106/76. Will continue to monitor off blood pressure medications.  Plan -Monitor blood pressure

## 2018-11-06 NOTE — Assessment & Plan Note (Addendum)
Assessment: Type 2 diabetes Patient is currently taking Metformin 1000mg  bid, levemir 50 at night and novolog 16 units with meals.. She just started ozempic one month ago.  Hemoglobin A1c is 9.0, previously 11.5 .  She does not have her meter today. She states that her blood sugars are checked once a day in the morning and they usually run around 130. At this time would like to increase ozempic to 0.5 from 0.25 however patient is receiving samples from our clinic and is about to run out. Patient will need to see Dr. Maudie Mercury to obtain medication.  Dr. Maudie Mercury is not currently in the office.   Plan -follow-up with Dr. Maudie Mercury this week -Follow-up with PCP in February -Continue current regimen and increase Ozempic once medication obtained

## 2018-11-06 NOTE — Progress Notes (Signed)
   CC: follow-up on type 2 diabetes  HPI:  [name] is a 57 y.o. female with history noted below that presents to the acute care clinic for follow-up on type 2 diabetes. Please see problem based charting for the status of patient's chronic medical conditions.  Past Medical History:  Diagnosis Date  . Anemia   . Asthma 11/01/2010  . Diabetes mellitus    since 1983  . GERD (gastroesophageal reflux disease)   . Hypertension   . Osteomyelitis ([place])   . Pneumonia    as a child    Review of Systems:  Review of Systems  Constitutional: Negative for malaise/fatigue.  Respiratory: Negative for shortness of breath.   Cardiovascular: Negative for chest pain and leg swelling.     Physical Exam:  Vitals:   11/06/18 0921 11/06/18 0954  BP: 139/67 106/76  Pulse: 87 98  Temp: 98 F (36.7 C)   TempSrc: Oral   SpO2: 99%   Weight: 285 lb 1.6 oz (129.3 kg)    Physical Exam  Constitutional: She is well-developed, well-nourished, and in no distress.  Cardiovascular: Normal rate, regular rhythm and normal heart sounds. Exam reveals no gallop and no friction rub.  No murmur heard. Pulmonary/Chest: Effort normal and breath sounds normal. No respiratory distress. She has no wheezes. She has no rales.       Assessment & Plan:   See encounters tab for problem based medical decision making.    Patient discussed with Dr. Rebeca Alert

## 2018-11-06 NOTE — Patient Instructions (Signed)
[name],  It was a pleasure seeing you today. Please follow up with Dr. Maudie Mercury for your medication. Please follow-up with your primary care provider in February.

## 2018-11-09 MED FILL — CONTOUR NEXT STRIPS: 25 days supply | Qty: 100 | Fill #4

## 2018-11-09 MED FILL — metFORMIN HCL 1000 MG TABS: 1000 | 30 days supply | Qty: 60 | Fill #0

## 2018-11-12 ENCOUNTER — Ambulatory Visit: Payer: Self-pay | Admitting: Pharmacist

## 2018-11-12 ENCOUNTER — Ambulatory Visit: Payer: Self-pay

## 2018-11-12 DIAGNOSIS — E118 Type 2 diabetes mellitus with unspecified complications: Secondary | ICD-10-CM

## 2018-11-12 DIAGNOSIS — I10 Essential (primary) hypertension: Secondary | ICD-10-CM

## 2018-11-12 DIAGNOSIS — Z794 Long term (current) use of insulin: Secondary | ICD-10-CM

## 2018-11-12 DIAGNOSIS — I152 Hypertension secondary to endocrine disorders: Secondary | ICD-10-CM

## 2018-11-12 DIAGNOSIS — E1159 Type 2 diabetes mellitus with other circulatory complications: Secondary | ICD-10-CM

## 2018-11-12 DIAGNOSIS — E1142 Type 2 diabetes mellitus with diabetic polyneuropathy: Secondary | ICD-10-CM

## 2018-11-12 DIAGNOSIS — E1165 Type 2 diabetes mellitus with hyperglycemia: Secondary | ICD-10-CM

## 2018-11-12 DIAGNOSIS — IMO0002 Reserved for concepts with insufficient information to code with codable children: Secondary | ICD-10-CM

## 2018-11-12 MED ORDER — LISINOPRIL 5 MG PO TABS: 5.0000 mg | ORAL_TABLET | Freq: Every day | ORAL | 0 refills | Status: DC

## 2018-11-12 MED ORDER — METFORMIN HCL ER 500 MG PO TB24: 1000.0000 mg | ORAL_TABLET | Freq: Two times a day (BID) | ORAL | 0 refills | Status: DC

## 2018-11-12 MED ORDER — SEMAGLUTIDE(0.25 OR 0.5MG/DOS) 2 MG/1.5ML ~~LOC~~ SOPN: 0.5000 mg | PEN_INJECTOR | SUBCUTANEOUS | 11 refills | Status: DC

## 2018-11-12 MED ORDER — ATORVASTATIN CALCIUM 40 MG PO TABS: 40.0000 mg | ORAL_TABLET | Freq: Every day | ORAL | 0 refills | Status: DC

## 2018-11-12 NOTE — Progress Notes (Signed)
Internal Medicine Clinic Attending  Case discussed with Dr. Hoffman at the time of the visit.  We reviewed the resident's history and exam and pertinent patient test results.  I agree with the assessment, diagnosis, and plan of care documented in the resident's note.  Alexander Raines, M.D., Ph.D.  

## 2018-11-13 NOTE — Progress Notes (Signed)
S: [name] is a 57 y.o. female reports to clinical pharmacist appointment for help with diabetes management per PCP.  Allergies  Allergen Reactions  . Aspirin Nausea And Vomiting  . Ibuprofen Nausea And Vomiting and Other (See Comments)    Abdominal pain  . Penicillins Other (See Comments)    unknown   Medication Sig  atorvastatin (LIPITOR) 40 MG tablet Take 1 tablet (40 mg total) by mouth daily.  blood glucose meter kit and supplies KIT Dispense based on patient and insurance preference. Use up to four times daily as directed. (FOR ICD-9 250.00, 250.01).  blood glucose meter kit and supplies Dispense based on patient and insurance preference. Use up to four times daily as directed.  Blood Glucose Monitoring Suppl (TRUE METRIX METER) DEVI 1 Device by Does not apply route 2 (two) times daily.  gabapentin (NEURONTIN) 300 MG capsule Take 1 capsule (300 mg total) by mouth 3 (three) times daily.  glucose blood (CONTOUR NEXT TEST) test strip 1 each by Other route every 6 (six) hours. Use as instructed  insulin aspart (NOVOLOG) 100 UNIT/ML FlexPen Inject 13 Units into the skin 3 (three) times daily with meals.  Insulin Detemir (LEVEMIR) 100 UNIT/ML Pen Inject 40 Units into the skin at bedtime.  Insulin Pen Needle 29G X 5MM MISC Inject under the skin as prescribed.  Insulin Syringes, Disposable, U-100 1 ML MISC Inject 0.55 mLs into the skin 2 (two) times daily with a meal.  Lancet Devices (AUTO-LANCET) MISC Prick finger for BG check as prescribed.  Lancets MISC 1 each by Does not apply route 4 (four) times daily -  before meals and at bedtime.  lisinopril (PRINIVIL,ZESTRIL) 5 MG tablet Take 1 tablet (5 mg total) by mouth at bedtime. IM PROGRAM  metFORMIN (GLUCOPHAGE XR) 500 MG 24 hr tablet Take 2 tablets (1,000 mg total) by mouth 2 (two) times daily with a meal.  Multiple Vitamin (MULTIVITAMIN WITH MINERALS) TABS tablet Take 1 tablet by mouth daily as needed.   omeprazole (PRILOSEC) 20 MG  capsule Take 1 capsule (20 mg total) by mouth daily.  Semaglutide,0.25 or 0.5MG/DOS, (OZEMPIC, 0.25 OR 0.5 MG/DOSE,) 2 MG/1.5ML SOPN Inject 0.5 mg into the skin once a week.   Past Medical History:  Diagnosis Date  . Anemia   . Asthma 11/01/2010  . Diabetes mellitus    since 1983  . GERD (gastroesophageal reflux disease)   . Hypertension   . Osteomyelitis ([place])   . Pneumonia    as a child   Social History   Socioeconomic History  . Marital status: Divorced    Spouse name: Not on file  . Number of children: Not on file  . Years of education: Not on file  . Highest education level: Not on file  Occupational History  . Not on file  Social Needs  . Financial resource strain: Not on file  . Food insecurity:    Worry: Not on file    Inability: Not on file  . Transportation needs:    Medical: Not on file    Non-medical: Not on file  Tobacco Use  . Smoking status: Never Smoker  . Smokeless tobacco: Never Used  Substance and Sexual Activity  . Alcohol use: No    Alcohol/week: 0.0 standard drinks  . Drug use: No  . Sexual activity: Not on file  Lifestyle  . Physical activity:    Days per week: Not on file    Minutes per session: Not on file  .  Stress: Not on file  Relationships  . Social connections:    Talks on phone: Not on file    Gets together: Not on file    Attends religious service: Not on file    Active member of club or organization: Not on file    Attends meetings of clubs or organizations: Not on file    Relationship status: Not on file  Other Topics Concern  . Not on file  Social History Narrative  . Not on file   Family History  Problem Relation Age of Onset  . Diabetes Father   . Hypertension Father   . Hypertension Mother   . Dementia Mother   . Diabetes Daughter    O: Component Value Date/Time   CHOL 143 04/22/2016 1340   HDL 55 04/22/2016 1340   LDLCALC 68 04/22/2016 1340   TRIG 101 04/22/2016 1340   GLUCOSE 272 (H) 07/28/2018 0442    HGBA1C 9.0 (A) 11/06/2018 0953   HGBA1C 11.5 (H) 02/20/2013 0525   NA 137 07/28/2018 0442   NA 136 06/25/2018 1613   K 4.3 07/28/2018 0442   CL 101 07/28/2018 0442   CO2 27 07/28/2018 0442   BUN 9 07/28/2018 0442   BUN 11 06/25/2018 1613   CREATININE 0.70 07/28/2018 0442   CREATININE 0.67 04/23/2015 1645   CALCIUM 8.9 07/28/2018 0442   GFRNONAA >60 07/28/2018 0442   GFRNONAA >89 01/06/2014 1643   GFRAA >60 07/28/2018 0442   GFRAA >89 01/06/2014 1643   AST 25 08/29/2014 1033   ALT 27 08/29/2014 1033   WBC 5.3 07/28/2018 0442   HGB 11.7 (L) 07/28/2018 0442   HGB 13.3 06/25/2018 1613   HCT 37.3 07/28/2018 0442   HCT 39.7 06/25/2018 1613   PLT 203 07/28/2018 0442   PLT 280 06/25/2018 1613   TSH 1.220 08/05/2013 1612   Ht Readings from Last 2 Encounters:  10/02/18 '6\' 1"'  (1.854 m)  09/06/18 '6\' 1"'  (1.854 m)   Wt Readings from Last 2 Encounters:  11/06/18 285 lb 1.6 oz (129.3 kg)  10/02/18 282 lb (127.9 kg)   There is no height or weight on file to calculate BMI. BP Readings from Last 3 Encounters:  11/06/18 106/76  10/02/18 132/81  09/06/18 131/85   A/P: Patient did not bring BG meter today. She has a history of medication adherence challenges including access barriers and low motivation. Patient reports increased engagement in her health. Notably, her A1C has improved from 11.5 in July to 9.0 in December. The most recent change to her DM is semaglutide, which she started around 1 month ago and is currently taking 0.25 mg weekly with no adverse effects. Patient was advised to increase to 0.5 mg weekly (patient was referred to [facility] MAP program for access), she verbalized understanding. All other DM medications were continued with no changes, patient was referred to [facility] pharmacy for continued access to the rest of her medications. Some of her prescriptions were sent to [facility] program for acute access while awaiting [facility]  medassist pharmacy enrollment.  An after visit summary was provided and patient advised to follow up in 3 weeks or sooner if any changes in condition or questions regarding medications arise.   The patient verbalized understanding of information provided by repeating back concepts discussed.

## 2018-11-26 MED FILL — CONTOUR NEXT STRIPS: 25 days supply | Qty: 100 | Fill #5

## 2018-11-27 ENCOUNTER — Encounter: Payer: Self-pay | Admitting: Pharmacist

## 2018-11-27 DIAGNOSIS — Z794 Long term (current) use of insulin: Secondary | ICD-10-CM

## 2018-11-27 DIAGNOSIS — K219 Gastro-esophageal reflux disease without esophagitis: Secondary | ICD-10-CM

## 2018-11-27 DIAGNOSIS — E1142 Type 2 diabetes mellitus with diabetic polyneuropathy: Secondary | ICD-10-CM

## 2018-11-27 MED ORDER — GABAPENTIN 300 MG PO CAPS: 300.0000 mg | ORAL_CAPSULE | Freq: Three times a day (TID) | ORAL | 3 refills | Status: DC

## 2018-11-27 MED ORDER — INSULIN LISPRO (1 UNIT DIAL) 100 UNIT/ML (KWIKPEN): 13.0000 [IU] | PEN_INJECTOR | Freq: Three times a day (TID) | SUBCUTANEOUS | 3 refills | Status: DC

## 2018-11-27 MED ORDER — METFORMIN HCL ER 500 MG PO TB24: 1000.0000 mg | ORAL_TABLET | Freq: Two times a day (BID) | ORAL | 3 refills | Status: DC

## 2018-11-27 MED ORDER — LISINOPRIL 5 MG PO TABS: 5.0000 mg | ORAL_TABLET | Freq: Every day | ORAL | 0 refills | Status: DC

## 2018-11-27 MED ORDER — BASAGLAR KWIKPEN 100 UNIT/ML ~~LOC~~ SOPN: 40.0000 [IU] | PEN_INJECTOR | Freq: Every day | SUBCUTANEOUS | 3 refills | Status: DC

## 2018-11-27 MED ORDER — OMEPRAZOLE 20 MG PO CPDR: 20.0000 mg | DELAYED_RELEASE_CAPSULE | Freq: Every day | ORAL | 1 refills | Status: DC

## 2018-11-27 MED ORDER — ATORVASTATIN CALCIUM 40 MG PO TABS: 40.0000 mg | ORAL_TABLET | Freq: Every day | ORAL | 3 refills | Status: DC

## 2018-11-27 NOTE — Addendum Note (Signed)
Addended by: Forde Dandy on: 11/27/2018 12:15 PM   Modules accepted: Orders

## 2018-11-27 NOTE — Progress Notes (Signed)
Patient was approved for free medications through [facility] pharmacy, except for [place]. Prescription for Ozempic was sent to [place] and patient was notified. All other prescriptions were sent to [facility] pharmacy.

## 2018-12-03 ENCOUNTER — Ambulatory Visit: Payer: Self-pay | Admitting: Pharmacist

## 2018-12-03 DIAGNOSIS — K219 Gastro-esophageal reflux disease without esophagitis: Secondary | ICD-10-CM

## 2018-12-03 DIAGNOSIS — E1142 Type 2 diabetes mellitus with diabetic polyneuropathy: Secondary | ICD-10-CM

## 2018-12-03 DIAGNOSIS — Z794 Long term (current) use of insulin: Secondary | ICD-10-CM

## 2018-12-03 MED ORDER — INSULIN LISPRO (1 UNIT DIAL) 100 UNIT/ML (KWIKPEN): 13.0000 [IU] | PEN_INJECTOR | Freq: Three times a day (TID) | SUBCUTANEOUS | 3 refills | Status: DC

## 2018-12-03 MED ORDER — METFORMIN HCL ER 500 MG PO TB24: 1000.0000 mg | ORAL_TABLET | Freq: Two times a day (BID) | ORAL | 3 refills | Status: DC

## 2018-12-03 MED ORDER — BASAGLAR KWIKPEN 100 UNIT/ML ~~LOC~~ SOPN: 40.0000 [IU] | PEN_INJECTOR | Freq: Every day | SUBCUTANEOUS | 3 refills | Status: DC

## 2018-12-03 MED ORDER — GABAPENTIN 300 MG PO CAPS: 300.0000 mg | ORAL_CAPSULE | Freq: Three times a day (TID) | ORAL | 3 refills | Status: DC

## 2018-12-03 MED ORDER — LISINOPRIL 5 MG PO TABS: 5.0000 mg | ORAL_TABLET | Freq: Every day | ORAL | 0 refills | Status: DC

## 2018-12-03 MED ORDER — OMEPRAZOLE 20 MG PO CPDR: 20.0000 mg | DELAYED_RELEASE_CAPSULE | Freq: Every day | ORAL | 1 refills | Status: DC

## 2018-12-03 MED ORDER — ATORVASTATIN CALCIUM 40 MG PO TABS: 40.0000 mg | ORAL_TABLET | Freq: Every day | ORAL | 3 refills | Status: DC

## 2018-12-03 NOTE — Progress Notes (Signed)
S: [name] is a 58 y.o. female reports to clinical pharmacist appointment for follow up help with diabetes management.  Allergies  Allergen Reactions  . Aspirin Nausea And Vomiting  . Ibuprofen Nausea And Vomiting and Other (See Comments)    Abdominal pain  . Penicillins Other (See Comments)    unknown   Medication Sig  atorvastatin (LIPITOR) 40 MG tablet Take 1 tablet (40 mg total) by mouth daily.  blood glucose meter kit and supplies KIT Dispense based on patient and insurance preference. Use up to four times daily as directed. (FOR ICD-9 250.00, 250.01).  gabapentin (NEURONTIN) 300 MG capsule Take 1 capsule (300 mg total) by mouth 3 (three) times daily.  glucose blood (CONTOUR NEXT TEST) test strip 1 each by Other route every 6 (six) hours. Use as instructed  Insulin Glargine (BASAGLAR KWIKPEN) 100 UNIT/ML SOPN Inject 0.4 mLs (40 Units total) into the skin daily.  insulin lispro (HUMALOG KWIKPEN) 100 UNIT/ML KwikPen Inject 0.13 mLs (13 Units total) into the skin 3 (three) times daily with meals.  Lancets MISC 1 each by Does not apply route 4 (four) times daily -  before meals and at bedtime.  lisinopril (PRINIVIL,ZESTRIL) 5 MG tablet Take 1 tablet (5 mg total) by mouth daily.  metFORMIN (GLUCOPHAGE XR) 500 MG 24 hr tablet Take 2 tablets (1,000 mg total) by mouth 2 (two) times daily with a meal.  Multiple Vitamin (MULTIVITAMIN WITH MINERALS) TABS tablet Take 1 tablet by mouth daily as needed.   omeprazole (PRILOSEC) 20 MG capsule Take 1 capsule (20 mg total) by mouth daily.  Semaglutide,0.25 or 0.5MG/DOS, (OZEMPIC, 0.25 OR 0.5 MG/DOSE,) 2 MG/1.5ML SOPN Inject 0.5 mg into the skin once a week.   Past Medical History:  Diagnosis Date  . Anemia   . Asthma 11/01/2010  . Diabetes mellitus    since 1983  . GERD (gastroesophageal reflux disease)   . Hypertension   . Osteomyelitis ([place])   . Pneumonia    as a child   Social History   Socioeconomic History  . Marital status:  Divorced    Spouse name: Not on file  . Number of children: Not on file  . Years of education: Not on file  . Highest education level: Not on file  Occupational History  . Not on file  Social Needs  . Financial resource strain: Not on file  . Food insecurity:    Worry: Not on file    Inability: Not on file  . Transportation needs:    Medical: Not on file    Non-medical: Not on file  Tobacco Use  . Smoking status: Never Smoker  . Smokeless tobacco: Never Used  Substance and Sexual Activity  . Alcohol use: No    Alcohol/week: 0.0 standard drinks  . Drug use: No  . Sexual activity: Not on file  Lifestyle  . Physical activity:    Days per week: Not on file    Minutes per session: Not on file  . Stress: Not on file  Relationships  . Social connections:    Talks on phone: Not on file    Gets together: Not on file    Attends religious service: Not on file    Active member of club or organization: Not on file    Attends meetings of clubs or organizations: Not on file    Relationship status: Not on file  Other Topics Concern  . Not on file  Social History Narrative  . Not on  file   Family History  Problem Relation Age of Onset  . Diabetes Father   . Hypertension Father   . Hypertension Mother   . Dementia Mother   . Diabetes Daughter    O:    Component Value Date/Time   CHOL 143 04/22/2016 1340   HDL 55 04/22/2016 1340   LDLCALC 68 04/22/2016 1340   TRIG 101 04/22/2016 1340   GLUCOSE 272 (H) 07/28/2018 0442   HGBA1C 9.0 (A) 11/06/2018 0953   HGBA1C 11.5 (H) 02/20/2013 0525   NA 137 07/28/2018 0442   NA 136 06/25/2018 1613   K 4.3 07/28/2018 0442   CL 101 07/28/2018 0442   CO2 27 07/28/2018 0442   BUN 9 07/28/2018 0442   BUN 11 06/25/2018 1613   CREATININE 0.70 07/28/2018 0442   CREATININE 0.67 04/23/2015 1645   CALCIUM 8.9 07/28/2018 0442   GFRNONAA >60 07/28/2018 0442   GFRNONAA >89 01/06/2014 1643   GFRAA >60 07/28/2018 0442   GFRAA >89 01/06/2014 1643    AST 25 08/29/2014 1033   ALT 27 08/29/2014 1033   WBC 5.3 07/28/2018 0442   HGB 11.7 (L) 07/28/2018 0442   HGB 13.3 06/25/2018 1613   HCT 37.3 07/28/2018 0442   HCT 39.7 06/25/2018 1613   PLT 203 07/28/2018 0442   PLT 280 06/25/2018 1613   TSH 1.220 08/05/2013 1612   Ht Readings from Last 2 Encounters:  10/02/18 '6\' 1"'  (1.854 m)  09/06/18 '6\' 1"'  (1.854 m)   Wt Readings from Last 2 Encounters:  11/06/18 285 lb 1.6 oz (129.3 kg)  10/02/18 282 lb (127.9 kg)   There is no height or weight on file to calculate BMI. BP Readings from Last 3 Encounters:  11/06/18 106/76  10/02/18 132/81  09/06/18 131/85   A/P:  Visit was limited due to patient stating she needed to leave. She did not bring BG meter, but states her home BG have improved to the 100s-200s, advised patient to bring BG meter to next appointment.  She was approved for free medication from [facility] but has not yet received the shipment. Re-sent prescriptions and advised patient to contact clinic if further medication access help is needed.  An after visit summary was provided and patient advised to follow up if any changes in condition or questions regarding medications arise.  The patient verbalized understanding of information provided by repeating back concepts discussed.

## 2018-12-10 ENCOUNTER — Ambulatory Visit: Payer: Self-pay

## 2018-12-10 MED FILL — CONTOUR NEXT STRIPS: 25 days supply | Qty: 100 | Fill #6

## 2018-12-11 ENCOUNTER — Encounter: Payer: Self-pay | Admitting: Internal Medicine

## 2018-12-31 MED FILL — CONTOUR NEXT STRIPS: 25 days supply | Qty: 100 | Fill #7

## 2019-01-25 MED FILL — NOVOLOG FLEXPEN SYRINGE: 100 | 30 days supply | Qty: 12 | Fill #3 | Status: TO

## 2019-01-25 MED FILL — LEVEMIR FLEXTOUCH 100 UNITS: 100 | 30 days supply | Qty: 12 | Fill #3 | Status: TO

## 2019-01-25 MED FILL — CONTOUR NEXT STRIPS: 25 days supply | Qty: 100 | Fill #8 | Status: TO

## 2019-01-28 ENCOUNTER — Emergency Department (HOSPITAL_COMMUNITY): Payer: Self-pay

## 2019-01-28 ENCOUNTER — Emergency Department (HOSPITAL_COMMUNITY)
Admission: EM | Admit: 2019-01-28 | Discharge: 2019-01-28 | Disposition: A | Discharge status: Home / Self Care | Payer: Self-pay | Attending: Emergency Medicine | Admitting: Emergency Medicine

## 2019-01-28 ENCOUNTER — Other Ambulatory Visit: Payer: Self-pay

## 2019-01-28 ENCOUNTER — Encounter (HOSPITAL_COMMUNITY): Payer: Self-pay

## 2019-01-28 DIAGNOSIS — J45909 Unspecified asthma, uncomplicated: Secondary | ICD-10-CM | POA: Insufficient documentation

## 2019-01-28 DIAGNOSIS — R1010 Upper abdominal pain, unspecified: Secondary | ICD-10-CM | POA: Insufficient documentation

## 2019-01-28 DIAGNOSIS — Z794 Long term (current) use of insulin: Secondary | ICD-10-CM | POA: Insufficient documentation

## 2019-01-28 DIAGNOSIS — I10 Essential (primary) hypertension: Secondary | ICD-10-CM | POA: Insufficient documentation

## 2019-01-28 DIAGNOSIS — E119 Type 2 diabetes mellitus without complications: Secondary | ICD-10-CM | POA: Insufficient documentation

## 2019-01-28 DIAGNOSIS — R072 Precordial pain: Secondary | ICD-10-CM | POA: Insufficient documentation

## 2019-01-28 DIAGNOSIS — Z79899 Other long term (current) drug therapy: Secondary | ICD-10-CM | POA: Insufficient documentation

## 2019-01-28 LAB — CBC
HCT: 37.8 % (ref 36.0–46.0)
Hemoglobin: 11.9 g/dL — ABNORMAL LOW (ref 12.0–15.0)
MCH: 30.4 pg (ref 26.0–34.0)
MCHC: 31.5 g/dL (ref 30.0–36.0)
MCV: 96.7 fL (ref 80.0–100.0)
PLATELETS: 216 10*3/uL (ref 150–400)
RBC: 3.91 MIL/uL (ref 3.87–5.11)
RDW: 12.2 % (ref 11.5–15.5)
WBC: 6.5 10*3/uL (ref 4.0–10.5)
nRBC: 0 % (ref 0.0–0.2)

## 2019-01-28 LAB — COMPREHENSIVE METABOLIC PANEL
ALT: 31 U/L (ref 0–44)
AST: 20 U/L (ref 15–41)
Albumin: 3.8 g/dL (ref 3.5–5.0)
Alkaline Phosphatase: 92 U/L (ref 38–126)
Anion gap: 10 (ref 5–15)
BUN: 12 mg/dL (ref 6–20)
CO2: 23 mmol/L (ref 22–32)
Calcium: 8.7 mg/dL — ABNORMAL LOW (ref 8.9–10.3)
Chloride: 103 mmol/L (ref 98–111)
Creatinine, Ser: 0.81 mg/dL (ref 0.44–1.00)
GFR calc Af Amer: >60 mL/min (ref >60)
GFR calc non Af Amer: >60 mL/min (ref >60)
Glucose, Bld: 307 mg/dL — ABNORMAL HIGH (ref 70–99)
Potassium: 4.2 mmol/L (ref 3.5–5.1)
Sodium: 136 mmol/L (ref 135–145)
Total Bilirubin: 0.6 mg/dL (ref 0.3–1.2)
Total Protein: 6.9 g/dL (ref 6.5–8.1)

## 2019-01-28 LAB — I-STAT TROPONIN, ED
Troponin i, poc: 0 ng/mL (ref 0.00–0.08)
Troponin i, poc: 0 ng/mL (ref 0.00–0.08)

## 2019-01-28 LAB — D-DIMER, QUANTITATIVE: D-Dimer, Quant: 0.38 ug/mL-FEU (ref 0.00–0.50)

## 2019-01-28 LAB — LIPASE, BLOOD: Lipase: 28 U/L (ref 11–51)

## 2019-01-28 NOTE — ED Provider Notes (Signed)
[facility] EMERGENCY DEPARTMENT Provider Note   CSN: [id number] Arrival date & time: 01/28/19  1349    History   Chief Complaint Chief Complaint  Patient presents with  . Chest Pain    HPI [name] is a 58 y.o. female.     Patient c/o mid to left chest pain, radiating towards back/shoulder since yesterday. Pain occurs at rest. Constant, dull, moderate, persistent. Pain is not pleuritic. No specific exacerbating or alleviating factors. Not related to position, eating or exertion. Denies hx cad. Reports normal cardiac cath 10 yrs ago for cp eval. No fam hx premature cad. No leg pain or swelling. No hx dvt or pe. No recent immobility, trauma, travel, or surgery. Denies abd pain. No vomiting. No fever or chills. Denies cough or uri symptoms.   The history is provided by the patient.  Chest Pain  Associated symptoms: no abdominal pain, no back pain, no cough, no fever, no headache, no palpitations, no shortness of breath and no vomiting     Past Medical History:  Diagnosis Date  . Anemia   . Asthma 11/01/2010  . Diabetes mellitus    since 1983  . GERD (gastroesophageal reflux disease)   . Hypertension   . Osteomyelitis ([place])   . Pneumonia    as a child    Patient Active Problem List   Diagnosis Date Noted  . Diabetes ([place]) 07/26/2018  . Long term current use of opiate analgesic 01/31/2017  . Opioid dependence ([place]) 11/27/2016  . [place] neuroma 11/02/2016  . Broken teeth 11/02/2016  . Left shoulder pain 05/05/2016  . Left elbow pain 05/05/2016  . Hyperlipidemia associated with type 2 diabetes mellitus ([place]) 04/22/2016  . Severe obesity (BMI 35.0-39.9) 01/22/2016  . GERD (gastroesophageal reflux disease) 03/03/2014  . Health care maintenance 01/06/2014  . Elevated TSH 05/13/2013  . Wrist pain 04/11/2013  . BPPV (benign paroxysmal positional vertigo) 03/07/2013  . Neuropathy in diabetes ([place]) 11/01/2010  . ASTHMA 11/01/2010  . Hypertension  associated with diabetes ([place]) 11/28/2008  . Diabetic osteomyelitis ([place]) 11/28/1988    Past Surgical History:  Procedure Laterality Date  . ACHILLES TENDON LENGTHENING Left 08/29/2014   Procedure: Left Achilles Lengthening;  Surgeon: Newt Minion, MD;  Location: [facility];  Service: Orthopedics;  Laterality: Left;  . AMPUTATION Left 02/19/2013   Procedure: Amputation of Left Great Toe;  Surgeon: Mcarthur Rossetti, MD;  Location: [place];  Service: Orthopedics;  Laterality: Left;  . AMPUTATION Left 08/29/2014   Procedure: Left Foot 2nd and 1st Toe Amputation;  Surgeon: Newt Minion, MD;  Location: [facility];  Service: Orthopedics;  Laterality: Left;  . CESAREAN SECTION     x 4  . TUBAL LIGATION       OB History   No obstetric history on file.      Home Medications    Prior to Admission medications   Medication Sig Start Date End Date Taking? Authorizing Provider  atorvastatin (LIPITOR) 40 MG tablet Take 1 tablet (40 mg total) by mouth daily. 12/03/18   Katherine Roan, MD  blood glucose meter kit and supplies KIT Dispense based on patient and insurance preference. Use up to four times daily as directed. (FOR ICD-9 250.00, 250.01). 07/09/18   Jean Rosenthal, MD  gabapentin (NEURONTIN) 300 MG capsule Take 1 capsule (300 mg total) by mouth 3 (three) times daily. 12/03/18   Katherine Roan, MD  glucose blood (CONTOUR NEXT TEST) test strip 1 each  by Other route every 6 (six) hours. Use as instructed 08/21/18 08/16/19  Katherine Roan, MD  Insulin Glargine (BASAGLAR KWIKPEN) 100 UNIT/ML SOPN Inject 0.4 mLs (40 Units total) into the skin daily. 12/03/18   Katherine Roan, MD  insulin lispro (HUMALOG KWIKPEN) 100 UNIT/ML KwikPen Inject 0.13 mLs (13 Units total) into the skin 3 (three) times daily with meals. 12/03/18   Katherine Roan, MD  Lancets MISC 1 each by Does not apply route 4 (four) times daily -  before meals and at bedtime. 09/30/15   Burns, Arloa Koh, MD  lisinopril (PRINIVIL,ZESTRIL)  5 MG tablet Take 1 tablet (5 mg total) by mouth daily. 12/03/18   Katherine Roan, MD  metFORMIN (GLUCOPHAGE XR) 500 MG 24 hr tablet Take 2 tablets (1,000 mg total) by mouth 2 (two) times daily with a meal. 12/03/18 12/03/19  Katherine Roan, MD  Multiple Vitamin (MULTIVITAMIN WITH MINERALS) TABS tablet Take 1 tablet by mouth daily as needed.     [provider]  omeprazole (PRILOSEC) 20 MG capsule Take 1 capsule (20 mg total) by mouth daily. 12/03/18   Katherine Roan, MD  Semaglutide,0.25 or 0.5MG/DOS, (OZEMPIC, 0.25 OR 0.5 MG/DOSE,) 2 MG/1.5ML SOPN Inject 0.5 mg into the skin once a week. 11/12/18   Katherine Roan, MD    Family History Family History  Problem Relation Age of Onset  . Diabetes Father   . Hypertension Father   . Hypertension Mother   . Dementia Mother   . Diabetes Daughter     Social History Social History   Tobacco Use  . Smoking status: Never Smoker  . Smokeless tobacco: Never Used  Substance Use Topics  . Alcohol use: No    Alcohol/week: 0.0 standard drinks  . Drug use: No     Allergies   Aspirin; Ibuprofen; and Penicillins   Review of Systems Review of Systems  Constitutional: Negative for fever.  HENT: Negative for sore throat.   Eyes: Negative for redness.  Respiratory: Negative for cough and shortness of breath.   Cardiovascular: Positive for chest pain. Negative for palpitations and leg swelling.  Gastrointestinal: Negative for abdominal pain and vomiting.  Genitourinary: Negative for flank pain.  Musculoskeletal: Negative for back pain and neck pain.  Skin: Negative for rash.  Neurological: Negative for headaches.  Hematological: Does not bruise/bleed easily.  Psychiatric/Behavioral: Negative for confusion.     Physical Exam Updated Vital Signs Pulse 80   Temp 98.4 F (36.9 C) (Oral)   Resp 12   Ht 1.854 m ('6\' 1"' )   Wt 127 kg   LMP 06/23/2014   SpO2 100%   BMI 36.94 kg/m   Physical Exam Vitals signs and nursing  note reviewed.  Constitutional:      Appearance: Normal appearance. She is well-developed.  HENT:     Head: Atraumatic.     Nose: Nose normal.     Mouth/Throat:     Mouth: Mucous membranes are moist.  Eyes:     General: No scleral icterus.    Conjunctiva/sclera: Conjunctivae normal.     Pupils: Pupils are equal, round, and reactive to light.  Neck:     Musculoskeletal: Normal range of motion and neck supple. No neck rigidity or muscular tenderness.     Trachea: No tracheal deviation.  Cardiovascular:     Rate and Rhythm: Normal rate and regular rhythm.     Pulses: Normal pulses.     Heart sounds: Normal heart sounds. No  murmur. No friction rub. No gallop.   Pulmonary:     Effort: Pulmonary effort is normal. No respiratory distress.     Breath sounds: Normal breath sounds.  Chest:     Chest wall: No tenderness.  Abdominal:     General: Bowel sounds are normal. There is no distension.     Palpations: Abdomen is soft. There is no mass.     Tenderness: There is no abdominal tenderness. There is no guarding.  Genitourinary:    Comments: No cva tenderness.  Musculoskeletal:        General: No swelling.  Skin:    General: Skin is warm and dry.     Findings: No rash.  Neurological:     Mental Status: She is alert.     Comments: Alert, speech normal.   Psychiatric:        Mood and Affect: Mood normal.      ED Treatments / Results  Labs (all labs ordered are listed, but only abnormal results are displayed) Results for orders placed or performed during the hospital encounter of 01/28/19  CBC  Result Value Ref Range   WBC 6.5 4.0 - 10.5 K/uL   RBC 3.91 3.87 - 5.11 MIL/uL   Hemoglobin 11.9 (L) 12.0 - 15.0 g/dL   HCT 37.8 36.0 - 46.0 %   MCV 96.7 80.0 - 100.0 fL   MCH 30.4 26.0 - 34.0 pg   MCHC 31.5 30.0 - 36.0 g/dL   RDW 12.2 11.5 - 15.5 %   Platelets 216 150 - 400 K/uL   nRBC 0.0 0.0 - 0.2 %  Comprehensive metabolic panel  Result Value Ref Range   Sodium 136 135 -  145 mmol/L   Potassium 4.2 3.5 - 5.1 mmol/L   Chloride 103 98 - 111 mmol/L   CO2 23 22 - 32 mmol/L   Glucose, Bld 307 (H) 70 - 99 mg/dL   BUN 12 6 - 20 mg/dL   Creatinine, Ser 0.81 0.44 - 1.00 mg/dL   Calcium 8.7 (L) 8.9 - 10.3 mg/dL   Total Protein 6.9 6.5 - 8.1 g/dL   Albumin 3.8 3.5 - 5.0 g/dL   AST 20 15 - 41 U/L   ALT 31 0 - 44 U/L   Alkaline Phosphatase 92 38 - 126 U/L   Total Bilirubin 0.6 0.3 - 1.2 mg/dL   GFR calc non Af Amer >60 >60 mL/min   GFR calc Af Amer >60 >60 mL/min   Anion gap 10 5 - 15  Lipase, blood  Result Value Ref Range   Lipase 28 11 - 51 U/L  D-dimer, quantitative (not at [facility])  Result Value Ref Range   D-Dimer, Quant 0.38 0.00 - 0.50 ug/mL-FEU  I-stat troponin, ED  Result Value Ref Range   Troponin i, poc 0.00 0.00 - 0.08 ng/mL   Comment 3           Dg Chest Port 1 View  Result Date: 01/28/2019 CLINICAL DATA:  Chest  Pain  2  days EXAM: PORTABLE CHEST 1 VIEW COMPARISON:  11/25/2015 FINDINGS: The heart size and mediastinal contours are within normal limits. Both lungs are clear. The visualized skeletal structures are unremarkable. IMPRESSION: No active disease. Electronically Signed   By: Nolon Nations M.D.   On: 01/28/2019 14:25    EKG EKG Interpretation  Date/Time:  Monday January 28 2019 14:04:20 EST Ventricular Rate:  87 PR Interval:    QRS Duration: 81 QT Interval:  363 QTC Calculation:  437 R Axis:   41 Text Interpretation:  Sinus rhythm Normal ECG No significant change since last tracing Confirmed by Lajean Saver [phone]) on 01/28/2019 3:14:05 PM   Radiology Dg Chest Port 1 View  Result Date: 01/28/2019 CLINICAL DATA:  Chest  Pain  2  days EXAM: PORTABLE CHEST 1 VIEW COMPARISON:  11/25/2015 FINDINGS: The heart size and mediastinal contours are within normal limits. Both lungs are clear. The visualized skeletal structures are unremarkable. IMPRESSION: No active disease. Electronically Signed   By: Nolon Nations M.D.   On: 01/28/2019 14:25     Procedures Procedures (including critical care time)  Medications Ordered in ED Medications - No data to display   Initial Impression / Assessment and Plan / ED Course  I have reviewed the triage vital signs and the nursing notes.  Pertinent labs & imaging results that were available during my care of the patient were reviewed by me and considered in my medical decision making (see chart for details).  Iv ns. Labs. Imaging.  Reviewed nursing notes and prior charts for additional history.   Labs reviewed - initial trop normal. ddimer normal.  cxr reviewed - no pna.   Recheck - pain is at times along costal margin and interscapula, will get u/s r/o gallstones. For completion ED cp eval, will also get delta troponin.   Signed out to Dr Melina Copa to check u/s and delta trop results. If normal, anticipate d/c to home with outpt pcp and card follow up then.       Final Clinical Impressions(s) / ED Diagnoses   Final diagnoses:  None    ED Discharge Orders    None       Lajean Saver, MD 01/28/19 1515

## 2019-01-28 NOTE — ED Triage Notes (Signed)
C/o L sided CP radiating to back ,shoulder, and chin since yesterday, with nausea and dizziness.  Took prilosec with no relief

## 2019-01-28 NOTE — Discharge Instructions (Addendum)
You were seen in the emergency department for chest pain.  You had blood work EKG and a chest x-ray that did not show an obvious explanation for your pain.  You had an ultrasound done that was not resulted at the time of your discharge at your request.  Please follow-up with your primary care doctor for further evaluation of your symptoms and results of your ultrasound.

## 2019-01-28 NOTE — ED Notes (Signed)
Pt expressed desire to leave when moved out to hallway, Dr Melina Copa at bedside, discharge papers provided. Denied pain upon departure.

## 2019-01-28 NOTE — ED Notes (Signed)
RN informed Pt can receive visitor  

## 2019-01-28 NOTE — ED Provider Notes (Signed)
Sent from Dr. Ashok Cordia.  58 year old female here with atypical chest pain radiating toward shoulder.  Initial EKG and troponin negative. Physical Exam  Pulse 80   Temp 98.4 F (36.9 C) (Oral)   Resp 12   Ht 6\' 1"  (1.854 m)   Wt 127 kg   LMP 06/23/2014   SpO2 100%   BMI 36.94 kg/m   Physical Exam  ED Course/Procedures   Clinical Course as of Jan 28 1742  Mon Jan 28, 2019  1733 Patient returned from ultrasound.  She was moved to a [place] bed.  Patient is irate about this and is demanding to be discharged.  I asked if she could wait long enough to get the report of the ultrasound and a second troponin but she was unwilling to do that.  I will have her follow-up with her PCP for her results.   [MB]  1945 Patient was returned to the emergency department and I was able to give her her results of her ultrasound.  She was willing to stick around for the second troponin which came back and was normal.  We will continue with discharge.   [MB]    Clinical Course User Index [MB] Hayden Rasmussen, MD    Procedures  MDM  Plan is to follow-up delta troponin and right upper quadrant ultrasound.  If these are negative she can be discharged to follow-up outpatient with her primary care doctor.       Hayden Rasmussen, MD 01/29/19 [phone]

## 2019-01-28 NOTE — ED Notes (Signed)
Patient verbalizes understanding of discharge instructions. Opportunity for questioning and answers were provided. Armband removed by staff, pt discharged from ED ambulatory.   

## 2019-02-15 MED FILL — NOVOLOG FLEXPEN SYRINGE: 100 | 30 days supply | Qty: 12 | Fill #0 | Status: TO

## 2019-02-15 MED FILL — LEVEMIR FLEXTOUCH 100 UNITS: 100 | 30 days supply | Qty: 12 | Fill #0 | Status: TO

## 2019-02-15 MED FILL — CONTOUR NEXT STRIPS: 25 days supply | Qty: 100 | Fill #0

## 2019-02-15 MED FILL — GABAPENTIN 300 MG CAPSULE: 300 | 30 days supply | Qty: 270 | Fill #0

## 2019-02-25 ENCOUNTER — Ambulatory Visit (INDEPENDENT_AMBULATORY_CARE_PROVIDER_SITE_OTHER): Payer: Self-pay | Admitting: Internal Medicine

## 2019-02-25 ENCOUNTER — Other Ambulatory Visit: Payer: Self-pay

## 2019-02-25 ENCOUNTER — Encounter: Payer: Self-pay | Admitting: Internal Medicine

## 2019-02-25 VITALS — BP 141/64 | HR 101 | Temp 98.4°F | Ht 73.0 in | Wt 288.2 lb

## 2019-02-25 DIAGNOSIS — E1169 Type 2 diabetes mellitus with other specified complication: Secondary | ICD-10-CM

## 2019-02-25 DIAGNOSIS — Z794 Long term (current) use of insulin: Secondary | ICD-10-CM

## 2019-02-25 DIAGNOSIS — E1142 Type 2 diabetes mellitus with diabetic polyneuropathy: Secondary | ICD-10-CM

## 2019-02-25 DIAGNOSIS — L84 Corns and callosities: Secondary | ICD-10-CM

## 2019-02-25 DIAGNOSIS — M869 Osteomyelitis, unspecified: Secondary | ICD-10-CM

## 2019-02-25 DIAGNOSIS — Z8739 Personal history of other diseases of the musculoskeletal system and connective tissue: Secondary | ICD-10-CM

## 2019-02-25 LAB — POCT GLYCOSYLATED HEMOGLOBIN (HGB A1C): Hemoglobin A1C: 10.3 % — AB (ref 4.0–5.6)

## 2019-02-25 LAB — GLUCOSE, CAPILLARY: Glucose-Capillary: 284 mg/dL — ABNORMAL HIGH (ref 70–99)

## 2019-02-25 NOTE — Assessment & Plan Note (Addendum)
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 MRI left foot done on 07/25/2018 showed plantar soft tissue ulcer on second metatarsal head and neck with abnormal marrow signal abnormality concerning for acute osteomyelitis.  The patient was hospitalized and placed on a 6-week course of IV ertapenem at that time. The patient was followed up by infectious diseases and in November 2019 was thought to be cured.  Patient was subsequently followed by wound care and was discharged from them in December 2019.  Over the past few days the patient notes soreness on the plantar aspect of her left foot.  She also notes purulent drainage from the area yesterday.  Assessment and plan There is a 1 cm ulceration noted on the plantar aspect of left foot that is not currently draining.  The ulcer was not able to be prodded to bone.  The patient currently does not have any fevers or chills.  Spoke to infectious disease Dr. Megan Salon who suggested that the patient get an MRI of the left foot to note for any signs of infection.  There is concerned that the patient may have failed therapy with IV or ertapenem.  Options will be to try a different antibiotic versus source control.  -Placed order for open MRI of left foot.  Will discuss results with patient and Dr. Megan Salon and make necessary changes at that time. -Patient needs tighter glucose control

## 2019-02-25 NOTE — Patient Instructions (Signed)
It was a pleasure to see you today [name]. I am sorry to hear about your left foot pain. I have called infectious disease and notified them about the pain. We would like for you to please get an MRI of your foot done ASAP to determine if there is an infection in the area. I will call you as soon as I get the results.   If you have any questions or concerns, please call our clinic at [phone] between 9am-5pm and after hours call [phone] and ask for the internal medicine resident on call. If you feel you are having a medical emergency please call 911.   Thank you, we look forward to help you remain healthy!  Lars Mage, MD Internal Medicine PGY2

## 2019-02-25 NOTE — Assessment & Plan Note (Addendum)
The patient's last a1c=9.0 on December 2019.  Her A1c during this visit was 10.3 the patient notes that she is currently taking levemir 60 units daily and novolog 17 units three times a day. The patient states that she was also supposed to be on ozempic, but she states that she has not received the medication.  She mentions that she was not taking her medication in February as she had a lot of unfortunate life events occur that caused her to not be adherent to her medication.   Assessment and plan  Talked to patient at length about making sure to be adherent to her medication. She stated that she will do so. Instructed patient to record glucose readings. Patient has telehealth appointment with Dr. Shan Levans on 03/04/19 to adjust diabetes medication based on readings.

## 2019-02-25 NOTE — Progress Notes (Addendum)
   CC: Left foot pain   HPI:  [name] is a 58 y.o. with diabetes mellitus 2, hx of osteomyelitis of 2nd metatarsal region who presents with pain in left foot. Please see problem based charting for evaluation, assessment, and plan.  Past Medical History:  Diagnosis Date  . Anemia   . Asthma 11/01/2010  . Diabetes mellitus    since 1983  . GERD (gastroesophageal reflux disease)   . Hypertension   . Osteomyelitis ([place])   . Pneumonia    as a child   Review of Systems:    Review of Systems  Constitutional: Negative for chills and fever.  Respiratory: Negative for cough.   Gastrointestinal: Negative for abdominal pain, nausea and vomiting.  Musculoskeletal: Positive for joint pain.  Neurological: Negative for dizziness.   Physical Exam:  Vitals:   02/25/19 1328  BP: (!) 141/64  Pulse: (!) 101  Temp: 98.4 F (36.9 C)  TempSrc: Oral  SpO2: 97%  Weight: 288 lb 3.2 oz (130.7 kg)  Height: 6\' 1"  (1.854 m)   Physical Exam  Constitutional: She appears well-developed and well-nourished. No distress.  HENT:  Head: Normocephalic and atraumatic.  Eyes: Conjunctivae are normal.  Cardiovascular: Normal rate, regular rhythm and normal heart sounds.  Respiratory: Effort normal and breath sounds normal. No respiratory distress. She has no wheezes.  GI: Soft. Bowel sounds are normal. She exhibits no distension. There is no abdominal tenderness.  Musculoskeletal:     Comments: 1cm area on plantar aspect of left foot that is not able to be prodded to bone. With surrounding area that is callused.   Neurological: She is alert.  Skin: She is not diaphoretic. No erythema.  Psychiatric: She has a normal mood and affect. Her behavior is normal. Judgment and thought content normal.     Assessment & Plan:   See Encounters Tab for problem based charting.  Patient discussed with Dr. Beryle Beams  Medicine attending: Medical history, presenting problems, physical findings, and  medications, reviewed with resident physician Dr Lars Mage on the day of the patient visit and I concur with her evaluation and management plan. I advised getting a follow up MRI to R/O recurrent osteo & call ID consultant for advice before pt leaves clinic which was done & consultant concurs. No fever. Will hold off on resuming antibiotics pending results of MRI.

## 2019-02-26 ENCOUNTER — Ambulatory Visit
Admission: RE | Admit: 2019-02-26 | Discharge: 2019-02-26 | Disposition: A | Discharge status: Home / Self Care | Payer: No Typology Code available for payment source | Source: Ambulatory Visit | Attending: Oncology | Admitting: Oncology

## 2019-02-26 MED ORDER — GADOBENATE DIMEGLUMINE 529 MG/ML IV SOLN
20.0000 mL | Freq: Once | INTRAVENOUS | Status: AC | PRN
  Administered 2019-02-26: 20 mL via INTRAVENOUS

## 2019-03-04 ENCOUNTER — Encounter: Payer: Self-pay | Admitting: Internal Medicine

## 2019-03-04 ENCOUNTER — Other Ambulatory Visit: Payer: Self-pay

## 2019-03-04 ENCOUNTER — Ambulatory Visit (INDEPENDENT_AMBULATORY_CARE_PROVIDER_SITE_OTHER): Payer: Self-pay | Admitting: Internal Medicine

## 2019-03-04 DIAGNOSIS — Z794 Long term (current) use of insulin: Secondary | ICD-10-CM

## 2019-03-04 DIAGNOSIS — E1142 Type 2 diabetes mellitus with diabetic polyneuropathy: Secondary | ICD-10-CM

## 2019-03-04 MED ORDER — BASAGLAR KWIKPEN 100 UNIT/ML ~~LOC~~ SOPN: 60.0000 [IU] | PEN_INJECTOR | Freq: Every day | SUBCUTANEOUS | 3 refills | Status: DC

## 2019-03-04 MED ORDER — INSULIN LISPRO (1 UNIT DIAL) 100 UNIT/ML (KWIKPEN): 16.0000 [IU] | PEN_INJECTOR | Freq: Three times a day (TID) | SUBCUTANEOUS | 3 refills | Status: DC

## 2019-03-04 NOTE — Assessment & Plan Note (Addendum)
   A: T2DM: More proactive taking her medicines on time since her last visit.  February and March were tough months, lost her apartment, lost her mother.  At that time things were difficult for her.  She currently takes her cbg once daily to conserve strips, Checking morning fasting.  This morning was 178, yesterday was 169, day prior 183.  Started a new meal plan with less calories.  No sodas or sweet tea.  She will soon have access to more strips and check more frequently. No changes to her foot in the last few days, actually bothering her less likely due to better glycemic control.  Her pain was likely more related to neuropathy.    P: give pt ozempic sample today, message Dr. Maudie Mercury about future access   I asked for at least twice a day, varying 2 hour post prandials in addition to fasting readings.Marland Kitchen

## 2019-03-04 NOTE — Progress Notes (Signed)
Medicine attending: Medical history, presenting problems,  and medications, reviewed with resident physician Dr Guadlupe Spanish on the day of the patient telephone consultation and I concur with his evaluation and management plan. Follow up MRI shows no evidence for recurrent osteomyelitis of the foot. Continue local care.

## 2019-03-04 NOTE — Progress Notes (Signed)
   [facility] Telephone Encounter  Reason for call:   This telephone encounter was created for [name] on 03/04/2019 for the following purpose/cc T2DM.   Pertinent Data:   L Foot ulcer with osteomyelitis treated with IV abx, repeat MRI 3/31 mild residual osteo vs reactive marrow but overall much improved from prior.  Discussion with ID physician no role for further abx therapy at this time. Lab Results  Component Value Date   HGBA1C 10.3 (A) 02/25/2019  ROS: Pulmonary: pt denies increased work of breathing, shortness of breath,  Cardiac: pt denies palpitations, chest pain,   Abdominal: pt denies abdominal pain, nausea, vomiting, or diarrhea     Assessment / Plan / Recommendations:   A: T2DM: More proactive taking her medicines on time since her last visit.  February and March were tough months, lost her apartment, lost her mother.  At that time things were difficult for her.  She currently takes her cbg once daily to conserve strips, Checking morning fasting.  This morning was 178, yesterday was 169, day prior 183.  Started a new meal plan with less calories.  No sodas or sweet tea.  She will soon have access to more strips and check more frequently. No changes to her foot in the last few days, actually bothering her less likely due to better glycemic control.  Her pain was likely more related to neuropathy.    P: give pt ozempic sample today, message Dr. Maudie Mercury about future access   I asked for at least twice a day, varying 2 hour post prandials in addition to fasting readings.  As always, pt is advised that if symptoms worsen or new symptoms arise, they should go to an urgent care facility or to to ER for further evaluation.   Consent and Medical Decision Making:   Patient discussed with Dr. Beryle Beams  This is a telephone encounter between [name] and Vickki Muff on 03/04/2019 for T2DM. The visit was conducted with the patient located  at home and Vickki Muff at [facility]. The patient's identity was confirmed using their DOB and current address. The patient has consented to being evaluated through a telephone encounter and understands the associated risks (an examination cannot be done and the patient may need to come in for an appointment) / benefits (allows the patient to remain at home, decreasing exposure to coronavirus). I personally spent 22 minutes on medical discussion.

## 2019-03-05 ENCOUNTER — Other Ambulatory Visit: Payer: Self-pay

## 2019-03-05 ENCOUNTER — Encounter (HOSPITAL_BASED_OUTPATIENT_CLINIC_OR_DEPARTMENT_OTHER): Payer: Self-pay

## 2019-03-05 ENCOUNTER — Encounter (HOSPITAL_BASED_OUTPATIENT_CLINIC_OR_DEPARTMENT_OTHER): Payer: Self-pay | Attending: Internal Medicine

## 2019-03-05 DIAGNOSIS — E1140 Type 2 diabetes mellitus with diabetic neuropathy, unspecified: Secondary | ICD-10-CM | POA: Insufficient documentation

## 2019-03-05 DIAGNOSIS — Z89422 Acquired absence of other left toe(s): Secondary | ICD-10-CM | POA: Insufficient documentation

## 2019-03-05 DIAGNOSIS — Z89412 Acquired absence of left great toe: Secondary | ICD-10-CM | POA: Insufficient documentation

## 2019-03-05 DIAGNOSIS — E11621 Type 2 diabetes mellitus with foot ulcer: Secondary | ICD-10-CM | POA: Insufficient documentation

## 2019-03-05 DIAGNOSIS — I10 Essential (primary) hypertension: Secondary | ICD-10-CM | POA: Insufficient documentation

## 2019-03-05 DIAGNOSIS — L97522 Non-pressure chronic ulcer of other part of left foot with fat layer exposed: Secondary | ICD-10-CM | POA: Insufficient documentation

## 2019-03-13 ENCOUNTER — Other Ambulatory Visit: Payer: Self-pay | Admitting: Internal Medicine

## 2019-03-13 DIAGNOSIS — E1142 Type 2 diabetes mellitus with diabetic polyneuropathy: Secondary | ICD-10-CM

## 2019-03-13 DIAGNOSIS — Z794 Long term (current) use of insulin: Secondary | ICD-10-CM

## 2019-03-13 NOTE — Telephone Encounter (Signed)
pls ask pt if she is going to wound care (appt 4/7 but with COVID???). Pls sch tele appt (likely will need to also be seen in person) in [facility] when Dr Shan Levans is here to F/U wound.

## 2019-03-14 ENCOUNTER — Other Ambulatory Visit: Payer: Self-pay

## 2019-03-14 ENCOUNTER — Ambulatory Visit (INDEPENDENT_AMBULATORY_CARE_PROVIDER_SITE_OTHER): Payer: Self-pay | Admitting: Internal Medicine

## 2019-03-14 DIAGNOSIS — E1142 Type 2 diabetes mellitus with diabetic polyneuropathy: Secondary | ICD-10-CM

## 2019-03-14 DIAGNOSIS — Z794 Long term (current) use of insulin: Secondary | ICD-10-CM

## 2019-03-14 DIAGNOSIS — M79641 Pain in right hand: Secondary | ICD-10-CM

## 2019-03-14 DIAGNOSIS — E1169 Type 2 diabetes mellitus with other specified complication: Secondary | ICD-10-CM

## 2019-03-14 DIAGNOSIS — M869 Osteomyelitis, unspecified: Secondary | ICD-10-CM

## 2019-03-14 HISTORY — DX: Pain in right hand: M79.641

## 2019-03-14 MED ORDER — BASAGLAR KWIKPEN 100 UNIT/ML ~~LOC~~ SOPN: 60.0000 [IU] | PEN_INJECTOR | Freq: Every day | SUBCUTANEOUS | 3 refills | Status: DC

## 2019-03-14 MED ORDER — INSULIN LISPRO (1 UNIT DIAL) 100 UNIT/ML (KWIKPEN): 18.0000 [IU] | PEN_INJECTOR | Freq: Three times a day (TID) | SUBCUTANEOUS | 3 refills | Status: DC

## 2019-03-14 MED ORDER — GABAPENTIN 300 MG PO CAPS: 900.0000 mg | ORAL_CAPSULE | Freq: Three times a day (TID) | ORAL | 2 refills | Status: DC

## 2019-03-14 MED ORDER — METFORMIN HCL ER 500 MG PO TB24: 1000.0000 mg | ORAL_TABLET | Freq: Two times a day (BID) | ORAL | 3 refills | Status: DC

## 2019-03-14 MED ORDER — DICLOFENAC SODIUM 1 % TD GEL: 2.0000 g | Freq: Four times a day (QID) | TRANSDERMAL | 0 refills | Status: AC | PRN

## 2019-03-14 MED FILL — DICLOFENAC SODIUM 1 % GEL: 1 | 13 days supply | Qty: 100 | Fill #0

## 2019-03-14 NOTE — Assessment & Plan Note (Signed)
   Diabetic osteomyelitis: still going to wound care center every week, foot looks good it is healing like it is supposed to.    P: continue wound care, continue working on glycemic control

## 2019-03-14 NOTE — Assessment & Plan Note (Signed)
   T2DM: 130-220 but mainly staying on the lower end due to the ozempic.  Less postprandial highs.  Taking her cbg's twice daily.  Feels overall has improved. She does not have a log of her cbg's and cannot access her meter while on our phone call today but assures me they are better.  The ozempic has really curbed her appetite.    P: reminded her to increase ozempic to 1mg  after 30 days  Refilled basal bolus insulin and metformin

## 2019-03-14 NOTE — Assessment & Plan Note (Addendum)
   Right hand pain: she is right handed but states she is ambidextrous. She is currently furlow.  No trauma of hand.  Pain has been on and off for a few months but is worsening.  Pain is not constant seems to flair.  It can be worse with movement.  Hurts in the fleshy area between thumb and forefinger.  No fevers or chills or systemic symptoms and seems to be chronic and related to use. Has been using icy hot with lidocaine with little relief.  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

## 2019-03-14 NOTE — Progress Notes (Signed)
   [place] [facility] Residency Telephone Encounter  Reason for call:   This telephone encounter was created for [name] on 03/14/2019 for the following purpose/cc T2DM, Diabetic Osteomyelitis, Right hand pain.   Pertinent Data:  ROS: Pulmonary: pt denies increased work of breathing, shortness of breath,  Cardiac: pt denies palpitations, chest pain,   Abdominal: pt denies abdominal pain, nausea, vomiting, or diarrhea   Assessment / Plan / Recommendations:   T2DM: 130-220 but mainly staying on the lower end due to the ozempic.  Less postprandial highs.  Taking her cbg's twice daily.  Feels overall has improved. She does not have a log of her cbg's and cannot access her meter while on our phone call today but assures me they are better.  The ozempic has really curbed her appetite.    P: reminded her to increase ozempic to 1mg  after 30 days  Refilled basal bolus insulin and metformin  Diabetic osteomyelitis: still going to wound care center every week, foot looks good it is healing like it is supposed to.    P: continue wound care, continue working on glycemic control  Right hand pain: she is right handed but states she is ambidextrous. She is currently furlow.  No trauma of hand.  Pain has been on and off for a few months but is worsening.  Pain is not constant seems to flair.  It can be worse with movement.  Hurts in the fleshy area between thumb and forefinger.  No fevers or chills or systemic symptoms and seems to be chronic and related to use. Has been using icy hot with lidocaine with little relief.  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   Consent and Medical Decision Making:   Patient discussed with Dr. Beryle Beams  This is a  telephone encounter between [name] and Vickki Muff on 03/14/2019 for T2DM, Diabetic Osteomyelitis, Right hand pain. The visit was conducted with the patient located at home and Vickki Muff at [facility]. The patient's identity was confirmed using their DOB and current address. The patient has consented to being evaluated through a telephone encounter and understands the associated risks (an examination cannot be done and the patient may need to come in for an appointment) / benefits (allows the patient to remain at home, decreasing exposure to coronavirus). I personally spent 20 minutes on medical discussion.

## 2019-03-15 NOTE — Progress Notes (Signed)
Medicine attending: Medical history, presenting problems, and medications, reviewed with resident physician Dr William Winfrey on the day of the patient telephone consultation and I concur with his evaluation and management plan. 

## 2019-03-19 MED FILL — CONTOUR NEXT STRIPS: 25 days supply | Qty: 100 | Fill #1

## 2019-04-05 ENCOUNTER — Other Ambulatory Visit: Payer: Self-pay | Admitting: Internal Medicine

## 2019-04-05 DIAGNOSIS — E1142 Type 2 diabetes mellitus with diabetic polyneuropathy: Secondary | ICD-10-CM

## 2019-04-05 DIAGNOSIS — Z794 Long term (current) use of insulin: Secondary | ICD-10-CM

## 2019-04-05 MED FILL — CONTOUR NEXT STRIPS: 25 days supply | Qty: 100 | Fill #2 | Status: TO

## 2019-04-09 ENCOUNTER — Encounter (HOSPITAL_BASED_OUTPATIENT_CLINIC_OR_DEPARTMENT_OTHER): Payer: Self-pay | Attending: Internal Medicine

## 2019-04-09 ENCOUNTER — Other Ambulatory Visit: Payer: Self-pay | Admitting: Internal Medicine

## 2019-04-09 DIAGNOSIS — E1142 Type 2 diabetes mellitus with diabetic polyneuropathy: Secondary | ICD-10-CM | POA: Insufficient documentation

## 2019-04-09 DIAGNOSIS — I10 Essential (primary) hypertension: Secondary | ICD-10-CM | POA: Insufficient documentation

## 2019-04-09 DIAGNOSIS — E11621 Type 2 diabetes mellitus with foot ulcer: Secondary | ICD-10-CM | POA: Insufficient documentation

## 2019-04-09 DIAGNOSIS — Z794 Long term (current) use of insulin: Secondary | ICD-10-CM

## 2019-04-09 DIAGNOSIS — L97522 Non-pressure chronic ulcer of other part of left foot with fat layer exposed: Secondary | ICD-10-CM | POA: Insufficient documentation

## 2019-04-09 NOTE — Telephone Encounter (Signed)
refilled 

## 2019-04-30 MED FILL — CONTOUR NEXT STRIPS: 25 days supply | Qty: 100 | Fill #0

## 2019-05-08 MED FILL — LEVEMIR FLEXTOUCH 100 UNITS: 100 | 30 days supply | Qty: 12 | Fill #0

## 2019-05-08 MED FILL — NOVOLOG FLEXPEN SYRINGE: 100 | 30 days supply | Qty: 12 | Fill #0

## 2019-05-08 MED FILL — GABAPENTIN 300 MG CAPSULE: 300 | 30 days supply | Qty: 270 | Fill #0

## 2019-05-13 MED FILL — CONTOUR NEXT STRIPS: 25 days supply | Qty: 100 | Fill #0

## 2019-06-04 MED FILL — CONTOUR NEXT STRIPS: 25 days supply | Qty: 100 | Fill #1

## 2019-06-10 ENCOUNTER — Other Ambulatory Visit: Payer: Self-pay | Admitting: Internal Medicine

## 2019-06-10 DIAGNOSIS — K219 Gastro-esophageal reflux disease without esophagitis: Secondary | ICD-10-CM

## 2019-06-11 ENCOUNTER — Other Ambulatory Visit: Payer: Self-pay | Admitting: Internal Medicine

## 2019-06-11 DIAGNOSIS — E1142 Type 2 diabetes mellitus with diabetic polyneuropathy: Secondary | ICD-10-CM

## 2019-06-11 DIAGNOSIS — E1165 Type 2 diabetes mellitus with hyperglycemia: Secondary | ICD-10-CM

## 2019-06-11 DIAGNOSIS — IMO0002 Reserved for concepts with insufficient information to code with codable children: Secondary | ICD-10-CM

## 2019-06-11 DIAGNOSIS — Z794 Long term (current) use of insulin: Secondary | ICD-10-CM

## 2019-06-11 NOTE — Telephone Encounter (Signed)
Needs refill on all medicine refills  ;pt contact [phone] [facility] [place], [place], [place] [address]   Pt is also needing someone to call [phone]

## 2019-06-12 MED ORDER — GABAPENTIN 300 MG PO CAPS: ORAL_CAPSULE | ORAL | 2 refills | Status: DC

## 2019-06-12 MED ORDER — CONTOUR NEXT TEST VI STRP: 1.0000 | ORAL_STRIP | Freq: Four times a day (QID) | 11 refills | Status: DC

## 2019-06-12 MED ORDER — INSULIN LISPRO (1 UNIT DIAL) 100 UNIT/ML (KWIKPEN): 18.0000 [IU] | PEN_INJECTOR | Freq: Three times a day (TID) | SUBCUTANEOUS | 3 refills | Status: DC

## 2019-06-12 MED ORDER — OZEMPIC (0.25 OR 0.5 MG/DOSE) 2 MG/1.5ML ~~LOC~~ SOPN: 1.0000 mg | PEN_INJECTOR | SUBCUTANEOUS | 11 refills | Status: DC

## 2019-06-12 MED ORDER — BASAGLAR KWIKPEN 100 UNIT/ML ~~LOC~~ SOPN: 60.0000 [IU] | PEN_INJECTOR | Freq: Every day | SUBCUTANEOUS | 3 refills | Status: DC

## 2019-06-12 NOTE — Telephone Encounter (Signed)
Only change was increasing ozempic to 1mg  weekly this was discussed with the patient at our last visit.

## 2019-06-28 MED FILL — LEVEMIR FLEXTOUCH 100 UNITS: 100 | 30 days supply | Qty: 12 | Fill #1

## 2019-06-28 MED FILL — CONTOUR NEXT STRIPS: 25 days supply | Qty: 100 | Fill #2

## 2019-06-28 MED FILL — NOVOLOG FLEXPEN SYRINGE: 100 | 30 days supply | Qty: 12 | Fill #1

## 2019-06-28 MED FILL — GABAPENTIN 300 MG CAPSULE: 300 | 30 days supply | Qty: 270 | Fill #1

## 2019-07-09 ENCOUNTER — Encounter (HOSPITAL_BASED_OUTPATIENT_CLINIC_OR_DEPARTMENT_OTHER): Payer: Self-pay | Attending: Internal Medicine

## 2019-08-01 ENCOUNTER — Other Ambulatory Visit: Payer: Self-pay | Admitting: Internal Medicine

## 2019-08-01 MED FILL — CONTOUR NEXT STRIPS: 25 days supply | Qty: 100 | Fill #3

## 2019-08-01 NOTE — Telephone Encounter (Signed)
Pt getting basaglar and humalog from [employer]

## 2019-08-06 ENCOUNTER — Other Ambulatory Visit: Payer: Self-pay | Admitting: Internal Medicine

## 2019-08-07 NOTE — Telephone Encounter (Signed)
Last time I checked she was getting insulin from [facility] and a different type of insulin, not sure what is going on I believe I have already denied this once.  I will try and get some time to call her today.

## 2019-08-07 NOTE — Telephone Encounter (Signed)
Called the patient she didn't have much time to talk she is at work but has plenty of insulin currently she does occasionally run out early from [facility], we will address this at her next visit which she will call in the am and schedule

## 2019-08-19 ENCOUNTER — Ambulatory Visit (INDEPENDENT_AMBULATORY_CARE_PROVIDER_SITE_OTHER): Payer: Self-pay | Admitting: Internal Medicine

## 2019-08-19 ENCOUNTER — Encounter: Payer: Self-pay | Admitting: Internal Medicine

## 2019-08-19 ENCOUNTER — Other Ambulatory Visit: Payer: Self-pay

## 2019-08-19 VITALS — BP 134/68 | HR 86 | Wt 298.6 lb

## 2019-08-19 DIAGNOSIS — E1142 Type 2 diabetes mellitus with diabetic polyneuropathy: Secondary | ICD-10-CM

## 2019-08-19 DIAGNOSIS — I10 Essential (primary) hypertension: Secondary | ICD-10-CM

## 2019-08-19 DIAGNOSIS — Z23 Encounter for immunization: Secondary | ICD-10-CM

## 2019-08-19 DIAGNOSIS — Z794 Long term (current) use of insulin: Secondary | ICD-10-CM

## 2019-08-19 DIAGNOSIS — Z79899 Other long term (current) drug therapy: Secondary | ICD-10-CM

## 2019-08-19 DIAGNOSIS — Z87891 Personal history of nicotine dependence: Secondary | ICD-10-CM

## 2019-08-19 DIAGNOSIS — E119 Type 2 diabetes mellitus without complications: Secondary | ICD-10-CM

## 2019-08-19 LAB — POCT GLYCOSYLATED HEMOGLOBIN (HGB A1C): Hemoglobin A1C: 9.9 % — AB (ref 4.0–5.6)

## 2019-08-19 LAB — GLUCOSE, CAPILLARY: Glucose-Capillary: 254 mg/dL — ABNORMAL HIGH (ref 70–99)

## 2019-08-19 MED ORDER — BASAGLAR KWIKPEN 100 UNIT/ML ~~LOC~~ SOPN: 70.0000 [IU] | PEN_INJECTOR | Freq: Every day | SUBCUTANEOUS | 3 refills | Status: DC

## 2019-08-19 MED ORDER — OZEMPIC (0.25 OR 0.5 MG/DOSE) 2 MG/1.5ML ~~LOC~~ SOPN: PEN_INJECTOR | SUBCUTANEOUS | 3 refills | Status: DC

## 2019-08-19 MED ORDER — INSULIN LISPRO (1 UNIT DIAL) 100 UNIT/ML (KWIKPEN): 18.0000 [IU] | PEN_INJECTOR | Freq: Three times a day (TID) | SUBCUTANEOUS | 3 refills | Status: DC

## 2019-08-19 NOTE — Progress Notes (Signed)
   CC: T2DM follow up  HPI:  [name] is a 58 y.o. female with PMH below.  Today we will address T2DM follow up  Please see A&P for status of the patient's chronic medical conditions  Past Medical History:  Diagnosis Date  . Anemia   . Asthma 11/01/2010  . Diabetes mellitus    since 1983  . GERD (gastroesophageal reflux disease)   . Hypertension   . Osteomyelitis ([place])   . Pneumonia    as a child   Review of Systems:  ROS: Pulmonary: pt denies increased work of breathing, shortness of breath,  Cardiac: pt denies palpitations, chest pain,  Abdominal: pt denies abdominal pain, nausea, vomiting, or diarrhea   Physical Exam:  Vitals:   08/19/19 1408 08/19/19 1448  BP: (!) 162/87 134/68  Pulse: (!) 101 86  SpO2: 99%   Weight: 298 lb 9.6 oz (135.4 kg)    Cardiac: JVD flat, normal rate and rhythm, clear s1 and s2, no murmurs, rubs or gallops Pulmonary: CTAB, not in distress Abdominal: non distended abdomen, soft and nontender Psych: Alert, conversant, in good spirits   Social History   Socioeconomic History  . Marital status: Divorced    Spouse name: Not on file  . Number of children: Not on file  . Years of education: Not on file  . Highest education level: Not on file  Occupational History  . Not on file  Social Needs  . Financial resource strain: Not on file  . Food insecurity    Worry: Not on file    Inability: Not on file  . Transportation needs    Medical: Not on file    Non-medical: Not on file  Tobacco Use  . Smoking status: Former Smoker    Quit date: 11/28/1962    Years since quitting: 56.7  . Smokeless tobacco: Never Used  Substance and Sexual Activity  . Alcohol use: No    Alcohol/week: 0.0 standard drinks  . Drug use: No  . Sexual activity: Not on file  Lifestyle  . Physical activity    Days per week: Not on file    Minutes per session: Not on file  . Stress: Not on file  Relationships  . Social Herbalist on phone:  Not on file    Gets together: Not on file    Attends religious service: Not on file    Active member of club or organization: Not on file    Attends meetings of clubs or organizations: Not on file    Relationship status: Not on file  . Intimate partner violence    Fear of current or ex partner: Not on file    Emotionally abused: Not on file    Physically abused: Not on file    Forced sexual activity: Not on file  Other Topics Concern  . Not on file  Social History Narrative  . Not on file    Family History  Problem Relation Age of Onset  . Diabetes Father   . Hypertension Father   . Hypertension Mother   . Dementia Mother   . Diabetes Daughter     Assessment & Plan:   See Encounters Tab for problem based charting.  Patient discussed with Dr. Philipp Ovens

## 2019-08-19 NOTE — Patient Instructions (Addendum)
[name], I will give you a sample of trulicity today which is similar to ozempic.  You have been given a two week supply.  Please call [facility] and see if they will deliver the ozempic as soon as possible.  Your bp was slightly high today we will repeat this at your next visit we may need to go up on your lisinopril dosage.

## 2019-08-19 NOTE — Assessment & Plan Note (Addendum)
She is currently on max dose metformin but has been out of ozempic for a few months.  Says [facility] did not have the order on file.  She also takes 70 units basal and 18 units bolus insulin. Feels her diabetes could be under better control.  She is currently moving and did not bring her meter.  Has gained about 10lbs since last in person visit.  The majority of her physical activity takes place at work which was shut down during coronavirus she works at [employer].    -will reorder ozempic and ramp up to avoid gi side effects -gave sample of trulicity in the mean time -continue 70/18 basal bolus -she will return with meter next visit

## 2019-08-20 NOTE — Progress Notes (Signed)
Internal Medicine Clinic Attending  Case discussed with Dr. Winfrey  at the time of the visit.  We reviewed the resident's history and exam and pertinent patient test results.  I agree with the assessment, diagnosis, and plan of care documented in the resident's note.  

## 2019-08-27 ENCOUNTER — Other Ambulatory Visit: Payer: Self-pay | Admitting: Internal Medicine

## 2019-08-27 DIAGNOSIS — IMO0002 Reserved for concepts with insufficient information to code with codable children: Secondary | ICD-10-CM

## 2019-08-27 DIAGNOSIS — E1165 Type 2 diabetes mellitus with hyperglycemia: Secondary | ICD-10-CM

## 2019-08-29 ENCOUNTER — Other Ambulatory Visit: Payer: Self-pay | Admitting: *Deleted

## 2019-08-29 DIAGNOSIS — IMO0002 Reserved for concepts with insufficient information to code with codable children: Secondary | ICD-10-CM

## 2019-08-29 DIAGNOSIS — E1165 Type 2 diabetes mellitus with hyperglycemia: Secondary | ICD-10-CM

## 2019-08-29 MED ORDER — CONTOUR NEXT TEST VI STRP: ORAL_STRIP | 1 refills | Status: DC

## 2019-08-29 MED FILL — CONTOUR NEXT STRIPS: 25 days supply | Qty: 100 | Fill #0

## 2019-08-29 NOTE — Telephone Encounter (Signed)
Please resend rx with "[facility]". Thanks 

## 2019-08-29 NOTE — Telephone Encounter (Signed)
done

## 2019-09-04 ENCOUNTER — Encounter: Payer: Self-pay | Admitting: Internal Medicine

## 2019-09-04 ENCOUNTER — Ambulatory Visit: Payer: Self-pay

## 2019-09-09 ENCOUNTER — Encounter: Payer: Self-pay | Admitting: Internal Medicine

## 2019-09-09 ENCOUNTER — Ambulatory Visit (INDEPENDENT_AMBULATORY_CARE_PROVIDER_SITE_OTHER): Payer: Self-pay | Admitting: Internal Medicine

## 2019-09-09 ENCOUNTER — Other Ambulatory Visit: Payer: Self-pay

## 2019-09-09 VITALS — BP 143/68 | HR 93 | Temp 98.5°F | Ht 72.0 in | Wt 302.2 lb

## 2019-09-09 DIAGNOSIS — Z87891 Personal history of nicotine dependence: Secondary | ICD-10-CM

## 2019-09-09 DIAGNOSIS — S39012A Strain of muscle, fascia and tendon of lower back, initial encounter: Secondary | ICD-10-CM

## 2019-09-09 DIAGNOSIS — E119 Type 2 diabetes mellitus without complications: Secondary | ICD-10-CM

## 2019-09-09 DIAGNOSIS — W03XXXA Other fall on same level due to collision with another person, initial encounter: Secondary | ICD-10-CM

## 2019-09-09 MED ORDER — TIZANIDINE HCL 4 MG PO TABS: 4.0000 mg | ORAL_TABLET | Freq: Every evening | ORAL | 0 refills | Status: DC | PRN

## 2019-09-09 MED FILL — tiZANidine HCL 4 MG TABS: 4 | 22 days supply | Qty: 22 | Fill #0

## 2019-09-09 NOTE — Patient Instructions (Addendum)
Please call [facility] for ozempic.  If you have any issues getting it please let me know.  I will prescribe a different muscle relaxer zanaflex you can start by taking it at night to see how drowsy it makes you.  Would purchase a heating pad and alternate ice and heat as mentioned below.   Muscle Strain A muscle strain is an injury that occurs when a muscle is stretched beyond its normal length. Usually, a small number of muscle fibers are torn when this happens. There are three types of muscle strains. First-degree strains have the least amount of muscle fiber tearing and the least amount of pain. Second-degree and third-degree strains have more tearing and pain. Usually, recovery from muscle strain takes 1-2 weeks. Complete healing normally takes 5-6 weeks. What are the causes? This condition is caused when a sudden, violent force is placed on a muscle and stretches it too far. This may occur with a fall, lifting, or sports. What increases the risk? This condition is more likely to develop in athletes and people who are physically active. What are the signs or symptoms? Symptoms of this condition include:  Pain.  Bruising.  Swelling.  Trouble using the muscle. How is this diagnosed? This condition is diagnosed based on a physical exam and your medical history. Tests may also be done, including an X-ray, ultrasound, or MRI. How is this treated? This condition is initially treated with PRICE therapy. This therapy involves:  Protecting the muscle from being injured again.  Resting the injured muscle.  Icing the injured muscle.  Applying pressure (compression) to the injured muscle. This may be done with a splint or elastic bandage.  Raising (elevating) the injured muscle. Your health care provider may also recommend medicine for pain. Follow these instructions at home: If you have a splint:  Wear the splint as told by your health care provider. Remove it only as told by your health  care provider.  Loosen the splint if your fingers or toes tingle, become numb, or turn cold and blue.  Keep the splint clean.  If the splint is not waterproof: ? Do not let it get wet. ? Cover it with a watertight covering when you take a bath or a shower. Managing pain, stiffness, and swelling   If directed, put ice on the injured area. ? If you have a removable splint, remove it as told by your health care provider. ? Put ice in a plastic bag. ? Place a towel between your skin and the bag. ? Leave the ice on for 20 minutes, 2-3 times a day.  Move your fingers or toes often to avoid stiffness and to lessen swelling.  Raise (elevate) the injured area above the level of your heart while you are sitting or lying down.  Wear an elastic bandage as told by your health care provider. Make sure that it is not too tight. General instructions  Take over-the-counter and prescription medicines only as told by your health care provider.  Restrict your activity and rest the injured muscle as told by your health care provider. Gentle movements may be allowed.  If physical therapy was prescribed, do exercises as told by your health care provider.  Do not put pressure on any part of the splint until it is fully hardened. This may take several hours.  Do not use any products that contain nicotine or tobacco, such as cigarettes and e-cigarettes. These can delay bone healing. If you need help quitting, ask your health  care provider.  Ask your health care provider when it is safe to drive if you have a splint.  Keep all follow-up visits as told by your health care provider. This is important. How is this prevented?  Warm up before exercising. This helps to prevent future muscle strains. Contact a health care provider if:  You have more pain or swelling in the injured area. Get help right away if:  You have numbness or tingling or lose a lot of strength in the injured area. Summary  A  muscle strain is an injury that occurs when a muscle is stretched beyond its normal length.  This condition is caused when a sudden, violent force is placed on a muscle and stretches it too far.  This condition is initially treated with PRICE therapy, which involves protecting, resting, icing, compressing, and elevating.  Gentle movements may be allowed. If physical therapy was prescribed, do exercises as told by your health care provider. This information is not intended to replace advice given to you by your health care provider. Make sure you discuss any questions you have with your health care provider. Document Released: 11/14/2005 Document Revised: 10/27/2017 Document Reviewed: 12/21/2016 Elsevier Patient Education  2020 [employer].

## 2019-09-09 NOTE — Progress Notes (Signed)
CC:  back pain from a fall  HPI:  [name] is a 58 y.o. female with PMH below.  Today we will address  back pain.  We will address her diabetes at the next visit as she did not bring her meter or have her ozempic delivered.  Gave her another sample of trulicity today.  She feels it is too much trouble to call [facility] to arrange her free medication delivery.     Please see A&P for status of the patient's chronic medical conditions  Past Medical History:  Diagnosis Date  . Anemia   . Asthma 11/01/2010  . BPPV (benign paroxysmal positional vertigo) 03/07/2013  . Diabetes mellitus    since 1983  . Elevated TSH 05/13/2013  . GERD (gastroesophageal reflux disease)   . Hypertension   . Left elbow pain 05/05/2016  . Left shoulder pain 05/05/2016  . Osteomyelitis ([place])   . Pneumonia    as a child  . Wrist pain 04/11/2013   Review of Systems:  ROS: Pulmonary: pt denies increased work of breathing, shortness of breath,  Cardiac: pt denies palpitations, chest pain,  Abdominal: pt denies abdominal pain, nausea, vomiting, or diarrhea   Physical Exam:  Vitals:   09/09/19 1314  BP: (!) 143/68  Pulse: 93  Temp: 98.5 F (36.9 C)  TempSrc: Oral  SpO2: 99%  Weight: (!) 302 lb 3.2 oz (137.1 kg)  Height: 6' (1.829 m)   Cardiac:  normal rate and rhythm, clear s1 and s2, no murmurs, rubs or gallops, no LE edema MSK: no tenderness along lumbar spine.  Normal range of motion.  Normal sensation and strength in bilateral lower extremities.  Muscular pain around 4-5cm on the left lateral of L4.   Psych: Alert, conversant, in good spirits   Social History   Socioeconomic History  . Marital status: Divorced    Spouse name: Not on file  . Number of children: Not on file  . Years of education: Not on file  . Highest education level: Not on file  Occupational History  . Not on file  Social Needs  . Financial resource strain: Not on file  . Food insecurity    Worry: Not on file    Inability: Not on file  . Transportation needs    Medical: Not on file    Non-medical: Not on file  Tobacco Use  . Smoking status: Former Smoker    Quit date: 11/28/1962    Years since quitting: 56.8  . Smokeless tobacco: Never Used  Substance and Sexual Activity  . Alcohol use: No    Alcohol/week: 0.0 standard drinks  . Drug use: No  . Sexual activity: Not on file  Lifestyle  . Physical activity    Days per week: Not on file    Minutes per session: Not on file  . Stress: Not on file  Relationships  . Social Herbalist on phone: Not on file    Gets together: Not on file    Attends religious service: Not on file    Active member of club or organization: Not on file    Attends meetings of clubs or organizations: Not on file    Relationship status: Not on file  . Intimate partner violence    Fear of current or ex partner: Not on file    Emotionally abused: Not on file    Physically abused: Not on file    Forced sexual activity: Not on file  Other Topics Concern  . Not on file  Social History Narrative  . Not on file    Family History  Problem Relation Age of Onset  . Diabetes Father   . Hypertension Father   . Hypertension Mother   . Dementia Mother   . Diabetes Daughter     Assessment & Plan:   See Encounters Tab for problem based charting.  Patient discussed with Dr. Rebeca Alert

## 2019-09-10 ENCOUNTER — Encounter: Payer: Self-pay | Admitting: Internal Medicine

## 2019-09-10 DIAGNOSIS — S39012A Strain of muscle, fascia and tendon of lower back, initial encounter: Secondary | ICD-10-CM | POA: Insufficient documentation

## 2019-09-10 NOTE — Assessment & Plan Note (Signed)
Last Wednesday fell at work, standing by the [employer].  A coworker accidentally got behind her getting into a cabinet and she tripped over him and fell on her back. Pain radiating down both legs main pain in left lumbar region No bowel or bladder incontinence, no saddle anesthesia.  Using tylenol arthritis.  Pain worse with standing has not taken any days off and is on her feet when working at [employer].  Pain is relieved with tylenol arthritis.  Her exam is consistent with paraspinal muscle strain.    -continue tylenol she cannot tolerate ibuprofen or other nsaids, will prescribe zanaflex as well which she will start taking at night -she will purchase a heating pad and alternate ice and heat -will write a note to give her a few days off of work as well

## 2019-09-12 NOTE — Progress Notes (Signed)
Internal Medicine Clinic Attending  Case discussed with Dr. Winfrey at the time of the visit.  We reviewed the resident's history and exam and pertinent patient test results.  I agree with the assessment, diagnosis, and plan of care documented in the resident's note.  Alexander Raines, M.D., Ph.D.  

## 2019-09-13 ENCOUNTER — Other Ambulatory Visit: Payer: Self-pay

## 2019-09-13 ENCOUNTER — Other Ambulatory Visit: Payer: Self-pay | Admitting: Internal Medicine

## 2019-09-13 ENCOUNTER — Other Ambulatory Visit: Payer: Self-pay | Admitting: *Deleted

## 2019-09-13 DIAGNOSIS — Z794 Long term (current) use of insulin: Secondary | ICD-10-CM

## 2019-09-13 DIAGNOSIS — E1142 Type 2 diabetes mellitus with diabetic polyneuropathy: Secondary | ICD-10-CM

## 2019-09-13 MED ORDER — LISINOPRIL 5 MG PO TABS: 5.0000 mg | ORAL_TABLET | Freq: Every day | ORAL | 3 refills | Status: DC

## 2019-09-13 MED ORDER — DULAGLUTIDE 0.75 MG/0.5ML ~~LOC~~ SOAJ: SUBCUTANEOUS | 0 refills | Status: AC

## 2019-09-13 NOTE — Telephone Encounter (Signed)
Opened new encounter 

## 2019-09-13 NOTE — Telephone Encounter (Signed)
Looks like they have changed their formulary to trulicity will write a new prescription for trulicity.  Thanks for letting me know!

## 2019-09-13 NOTE — Telephone Encounter (Signed)
Pt states she cannot get ozempic from [facility] because they do not carry it. Can you change it? Also needs a refill on lisinopril to go to [facility] She is making an appt with [facility] for orange card

## 2019-09-13 NOTE — Telephone Encounter (Signed)
Requesting to speak with a nurse about meds, please call pt back.  

## 2019-09-16 ENCOUNTER — Encounter: Payer: Self-pay | Admitting: Internal Medicine

## 2019-09-27 ENCOUNTER — Other Ambulatory Visit: Payer: Self-pay | Admitting: Internal Medicine

## 2019-09-27 MED FILL — GABAPENTIN 300 MG CAPSULE: 300 | 30 days supply | Qty: 270 | Fill #2

## 2019-09-27 MED FILL — CONTOUR NEXT STRIPS: 25 days supply | Qty: 100 | Fill #1

## 2019-10-02 ENCOUNTER — Ambulatory Visit: Payer: Self-pay

## 2019-10-02 ENCOUNTER — Other Ambulatory Visit: Payer: Self-pay | Admitting: Internal Medicine

## 2019-10-07 ENCOUNTER — Other Ambulatory Visit: Payer: Self-pay | Admitting: Family Medicine

## 2019-10-07 ENCOUNTER — Other Ambulatory Visit: Payer: Self-pay

## 2019-10-07 ENCOUNTER — Ambulatory Visit: Payer: Self-pay

## 2019-10-07 DIAGNOSIS — M545 Low back pain, unspecified: Secondary | ICD-10-CM

## 2019-10-10 ENCOUNTER — Other Ambulatory Visit: Payer: Self-pay

## 2019-10-10 ENCOUNTER — Emergency Department (HOSPITAL_COMMUNITY)
Admission: EM | Admit: 2019-10-10 | Discharge: 2019-10-10 | Disposition: A | Discharge status: Home / Self Care | Payer: Self-pay | Attending: Emergency Medicine | Admitting: Emergency Medicine

## 2019-10-10 ENCOUNTER — Encounter (HOSPITAL_COMMUNITY): Payer: Self-pay | Admitting: Emergency Medicine

## 2019-10-10 DIAGNOSIS — M5442 Lumbago with sciatica, left side: Secondary | ICD-10-CM | POA: Insufficient documentation

## 2019-10-10 DIAGNOSIS — E119 Type 2 diabetes mellitus without complications: Secondary | ICD-10-CM | POA: Insufficient documentation

## 2019-10-10 DIAGNOSIS — Z794 Long term (current) use of insulin: Secondary | ICD-10-CM | POA: Insufficient documentation

## 2019-10-10 DIAGNOSIS — I10 Essential (primary) hypertension: Secondary | ICD-10-CM | POA: Insufficient documentation

## 2019-10-10 DIAGNOSIS — Z87891 Personal history of nicotine dependence: Secondary | ICD-10-CM | POA: Insufficient documentation

## 2019-10-10 DIAGNOSIS — Z79899 Other long term (current) drug therapy: Secondary | ICD-10-CM | POA: Insufficient documentation

## 2019-10-10 MED ORDER — LIDOCAINE 5 % EX PTCH
1.0000 | MEDICATED_PATCH | CUTANEOUS | Status: DC
  Administered 2019-10-10: 14:00:00 1 via TRANSDERMAL
  Filled 2019-10-10: qty 1

## 2019-10-10 MED ORDER — PREDNISONE 20 MG PO TABS: 20.0000 mg | ORAL_TABLET | Freq: Every day | ORAL | 0 refills | Status: AC

## 2019-10-10 MED ORDER — LIDOCAINE 5 % EX PTCH: 1.0000 | MEDICATED_PATCH | CUTANEOUS | 0 refills | Status: DC

## 2019-10-10 NOTE — Discharge Instructions (Signed)
Continue taking the Tylenol.  I have also prescribed you with lidocaine patches.  You place these for 12 hours and remove for 12 hours.  Must be patch free for 12 hours before he completes an additional patch.  You may also take the muscle relaxers.  I have given you prednisone.  You will need to check your blood sugars frequently.  If these are elevated above 250 you to stop the prednisone.  As discussed in the room you did not want an MRI.  We are not able to rule out osteomyelitis as cause of your pain.  If you develop worsening pain, fever, chills please seek reevaluation at that time.  Otherwise you may follow-up with neurosurgery or orthopedics.  Their contact information is listed on your discharge paperwork.

## 2019-10-10 NOTE — ED Triage Notes (Signed)
Per [facility] pt coming from her workplace c/o lower left sided back pain raidiating into bilateral legs. Patient had a fall about a month ago while at work, saw [employer] comp Monday morning and had x-rays. Patient currently waiting on orthopedics to contact her. States heat and muscle relaxer's are not helping. Denies any bowel or bladder issues.

## 2019-10-10 NOTE — ED Provider Notes (Signed)
[facility] EMERGENCY DEPARTMENT Provider Note   CSN: [id number] Arrival date & time: 10/10/19  1115   History   Chief Complaint Chief Complaint  Patient presents with   Back Pain   HPI [name] is a 58 y.o. female with past medical history significant for HTN, DM, osteomyelitis who presents for evaluation of back pain.  Patient states she has had back pain following a work accident 1 month ago.  Patient states she fell flat on her back after tripping over a coworker.  She has had midline lumbar pain since the incident.  Patient states Tylenol arthritis will help with the pain.  She was seen by occupational health who gave her work restrictions told her Tylenol and muscle relaxers.  Patient states the muscle relaxers make her sleepy and she is not able to take this at work.  Patient states her employer apparently did follow the restrictions on the first day however they are no longer honoring her restrictions.  Patient states pain resolves if she lays still.  States pain is worse when she stands on her feet and bends for long periods of time which she apparently does at work.  She denies fever, chills, nausea, vomiting, chest pain, shortness of breath, abdominal pain, diarrhea, dysuria, hematuria, decreased range of motion to extremities, history IV drug use, bowel or bladder incontinence, saddle paresthesia, history of malignancy or chronic steroid use.  Patient is able to ambulate by difficulty.  She rates her current pain a 2/10.  Pain worse with movement.  Patient had imaging done on Monday and they referred her outpatient to an orthopedist.  Patient states her orthopedist has not contacted her and she does not have a phone number to call them.  History obtained from patient and past medical records.  No interpreter is used.     HPI  Past Medical History:  Diagnosis Date   Anemia    Asthma 11/01/2010   BPPV (benign paroxysmal positional vertigo) 03/07/2013    Diabetes mellitus    since 1983   Elevated TSH 05/13/2013   GERD (gastroesophageal reflux disease)    Hypertension    Left elbow pain 05/05/2016   Left shoulder pain 05/05/2016   Osteomyelitis ([place])    Pneumonia    as a child   Wrist pain 04/11/2013    Patient Active Problem List   Diagnosis Date Noted   Strain of lumbar paraspinal muscle, initial encounter 09/10/2019   Hand pain, right 03/14/2019   Diabetes ([place]) 07/26/2018   Long term current use of opiate analgesic 01/31/2017   Opioid dependence ([place]) 11/27/2016   [place] neuroma 11/02/2016   Broken teeth 11/02/2016   Hyperlipidemia associated with type 2 diabetes mellitus ([place]) 04/22/2016   Severe obesity (BMI 35.0-39.9) 01/22/2016   GERD (gastroesophageal reflux disease) 03/03/2014   Health care maintenance 01/06/2014   Neuropathy in diabetes ([place]) 11/01/2010   ASTHMA 11/01/2010   Hypertension associated with diabetes ([place]) 11/28/2008   Diabetic osteomyelitis ([place]) 11/28/1988    Past Surgical History:  Procedure Laterality Date   ACHILLES TENDON LENGTHENING Left 08/29/2014   Procedure: Left Achilles Lengthening;  Surgeon: Newt Minion, MD;  Location: [facility];  Service: Orthopedics;  Laterality: Left;   AMPUTATION Left 02/19/2013   Procedure: Amputation of Left Great Toe;  Surgeon: Mcarthur Rossetti, MD;  Location: [facility];  Service: Orthopedics;  Laterality: Left;   AMPUTATION Left 08/29/2014   Procedure: Left Foot 2nd and 1st Toe Amputation;  Surgeon: Meridee Score  V, MD;  Location: [facility];  Service: Orthopedics;  Laterality: Left;   CESAREAN SECTION     x 4   TUBAL LIGATION       OB History   No obstetric history on file.      Home Medications    Prior to Admission medications   Medication Sig Start Date End Date Taking? Authorizing Provider  atorvastatin (LIPITOR) 40 MG tablet Take 1 tablet (40 mg total) by mouth daily. 12/03/18   Katherine Roan, MD  blood glucose meter kit and  supplies KIT Dispense based on patient and insurance preference. Use up to four times daily as directed. (FOR ICD-9 250.00, 250.01). 07/09/18   Agyei, Caprice Kluver, MD  Dulaglutide 0.75 MG/0.5ML SOPN Inject 0.75 mg into the skin once a week for 30 days, THEN 1.5 mg once a week. 09/13/19 12/12/19  Katherine Roan, MD  gabapentin (NEURONTIN) 300 MG capsule TAKE 3 CAPSULES BY MOUTH 3 TIMES DAILY. 06/12/19   Katherine Roan, MD  glucose blood (CONTOUR NEXT TEST) test strip Use to check blood sugar 4 times daily. DIAG CODE E 11.40. insulin dependent 08/29/19   Katherine Roan, MD  Insulin Glargine (BASAGLAR KWIKPEN) 100 UNIT/ML SOPN Inject 0.7 mLs (70 Units total) into the skin daily. 08/19/19 11/17/19  Katherine Roan, MD  insulin lispro (HUMALOG KWIKPEN) 100 UNIT/ML KwikPen Inject 0.18 mLs (18 Units total) into the skin 3 (three) times daily with meals. 08/19/19   Katherine Roan, MD  Lancets MISC 1 each by Does not apply route 4 (four) times daily -  before meals and at bedtime. 09/30/15   Burns, Arloa Koh, MD  lidocaine (LIDODERM) 5 % Place 1 patch onto the skin daily. Remove & Discard patch within 12 hours or as directed by MD 10/10/19   Shaunta Oncale A, PA-C  lisinopril (ZESTRIL) 5 MG tablet Take 1 tablet (5 mg total) by mouth daily. 09/13/19   Katherine Roan, MD  metFORMIN (GLUCOPHAGE XR) 500 MG 24 hr tablet Take 2 tablets (1,000 mg total) by mouth 2 (two) times daily with a meal. 03/14/19 03/13/20  Katherine Roan, MD  Multiple Vitamin (MULTIVITAMIN WITH MINERALS) TABS tablet Take 1 tablet by mouth daily as needed.     [provider]  omeprazole (PRILOSEC) 20 MG capsule TAKE 1 Capsule BY MOUTH ONCE DAILY 06/10/19   Katherine Roan, MD  predniSONE (DELTASONE) 20 MG tablet Take 1 tablet (20 mg total) by mouth daily for 7 days. 10/10/19 10/17/19  Chasta Deshpande A, PA-C  tiZANidine (ZANAFLEX) 4 MG tablet Take 1 tablet (4 mg total) by mouth at bedtime as needed for muscle spasms.  09/09/19 09/08/20  Katherine Roan, MD    Family History Family History  Problem Relation Age of Onset   Diabetes Father    Hypertension Father    Hypertension Mother    Dementia Mother    Diabetes Daughter     Social History Social History   Tobacco Use   Smoking status: Former Smoker    Quit date: 11/28/1962    Years since quitting: 56.9   Smokeless tobacco: Never Used  Substance Use Topics   Alcohol use: No    Alcohol/week: 0.0 standard drinks   Drug use: No     Allergies   Aspirin, Ibuprofen, and Penicillins   Review of Systems Review of Systems  Constitutional: Negative.   HENT: Negative.   Respiratory: Negative.   Cardiovascular: Negative.   Gastrointestinal: Negative.  Genitourinary: Negative.   Musculoskeletal: Positive for back pain. Negative for arthralgias, gait problem, joint swelling, myalgias, neck pain and neck stiffness.  Skin: Negative.   Neurological: Negative.   All other systems reviewed and are negative.    Physical Exam Updated Vital Signs BP 125/64 (BP Location: Right Arm)    Pulse 80    Temp 97.9 F (36.6 C) (Oral)    Resp 15    LMP 06/23/2014    SpO2 97%   Physical Exam  Physical Exam  Constitutional: Pt appears well-developed and well-nourished. No distress.  HENT:  Head: Normocephalic and atraumatic.  Mouth/Throat: Oropharynx is clear and moist. No oropharyngeal exudate.  Eyes: Conjunctivae are normal.  Neck: Normal range of motion. Neck supple.  Full ROM without pain  Cardiovascular: Normal rate, regular rhythm and intact distal pulses.   Pulmonary/Chest: Effort normal and breath sounds normal. No respiratory distress. Pt has no wheezes.  Abdominal: Soft. Pt exhibits no distension. There is no tenderness, rebound or guarding. No abd bruit or pulsatile mass Musculoskeletal:  Full range of motion of the T-spine and L-spine with flexion, hyperextension, and lateral flexion. No midline tenderness or stepoffs. No  tenderness to palpation of the spinous processes of the T-spine or L-spine. Mild tenderness to palpation of the paraspinous muscles of the LEFT L-spine. Positive straight leg raise on LEFT at 40'.  Tenderness over left piriformis.  She has equal strength to bilateral lower extremities. Lymphadenopathy:    Pt has no cervical adenopathy.  Neurological: Pt is alert. Pt has normal reflexes.  Reflex Scores:      Bicep reflexes are 2+ on the right side and 2+ on the left side.      Brachioradialis reflexes are 2+ on the right side and 2+ on the left side.      Patellar reflexes are 2+ on the right side and 2+ on the left side.      Achilles reflexes are 2+ on the right side and 2+ on the left side. Speech is clear and goal oriented, follows commands Normal 5/5 strength in upper and lower extremities bilaterally including dorsiflexion and plantar flexion, strong and equal grip strength Sensation normal to light and sharp touch Moves extremities without ataxia, coordination intact Normal gait Normal balance No Clonus Skin: Skin is warm and dry. No rash noted or lesions noted. Pt is not diaphoretic. No erythema, ecchymosis,edema or warmth.  Psychiatric: Pt has a normal mood and affect. Behavior is normal.  Nursing note and vitals reviewed. ED Treatments / Results  Labs (all labs ordered are listed, but only abnormal results are displayed) Labs Reviewed - No data to display  EKG None  Radiology No results found.  Procedures Procedures (including critical care time)  Medications Ordered in ED Medications  lidocaine (LIDODERM) 5 % 1 patch (has no administration in time range)   Initial Impression / Assessment and Plan / ED Course  I have reviewed the triage vital signs and the nursing notes.  Pertinent labs & imaging results that were available during my care of the patient were reviewed by me and considered in my medical decision making (see chart for details).  58 year old female  appears otherwise well presents for evaluation of back pain.  Back pain x1 month after work accident.  She denies any radicular symptoms.  Denies IV drug use, bowel or bladder incontinence, saddle paresthesia, paresthesias to extremities.  She is ambulating without difficulty.  Her pain resolves when she is at home however pain returns  when she is at work when she stands on her feet and bends.  Regionally given work restrictions from occupational health and had x-ray done at that time however patient states her work is no longer honoring these restrictions and she feels like she needs to stay out of work until she can be valued by orthopedics.  Patient with nonfocal neurologic exam without deficits.  She does have some tenderness to her left paraspinal muscles with tenderness over her left piriformis.  She has full and equal strength bilaterally.  She is ambulatory without difficulty.  Heart and lungs clear.  Abdomen soft.  Compartments soft.  2+ DP, PT pulses bilaterally.  No overlying skin changes to her back.  She is diabetic and has history of osteomyelitis in her foot.  Given this I recommended to patient MRI of her lumbar spine to rule out osteomyelitis as cause of her extended back pain.  Discussed risk versus benefit.  Patient voiced understanding of risk versus benefit and does not want any imaging at this time.  She does not appear septic or ill.  Discussed with patient cannot rule out deeper space infection without the MRI which she voices understanding.  Patient states would like "A work note."  We will also DC home with short course of steroids and lidocaine patches.  I have given her the referral to orthopedist.  I discussed return precautions with patient.  Patient voiced understanding is agreeable follow-up.  Discussed with patient she will need to check her blood sugars frequently and if these are elevated she will need to stop her blood thinners.  Her pain is resolved when she takes Tylenol  arthritis.  I have reviewed her prior lumbar films taken on Monday which not show any acute pathology.  Patient with back pain.  No neurological deficits and normal neuro exam.  Patient can walk but states is painful.  No loss of bowel or bladder control.  No concern for cauda equina, discitis, transverse myelitis, psoas abscess.  Patient knows that we cannot rule out osteomyelitis of her lumbar spine without MRI which she voices understanding of her risk versus benefit..  No fever, night sweats, weight loss, h/o cancer, IVDU.  RICE protocol and pain medicine indicated and discussed with patient.   The patient has been appropriately medically screened and/or stabilized in the ED. I have low suspicion for any other emergent medical condition which would require further screening, evaluation or treatment in the ED or require inpatient management.  Patient is hemodynamically stable and in no acute distress.  Patient able to ambulate in department prior to ED.  Evaluation does not show acute pathology that would require ongoing or additional emergent interventions while in the emergency department or further inpatient treatment.  I have discussed the diagnosis with the patient and answered all questions.  Pain is been managed while in the emergency department and patient has no further complaints prior to discharge.  Patient is comfortable with plan discussed in room and is stable for discharge at this time.  I have discussed strict return precautions for returning to the emergency department.  Patient was encouraged to follow-up with PCP/specialist refer to at discharge.        Final Clinical Impressions(s) / ED Diagnoses   Final diagnoses:  Acute left-sided low back pain with left-sided sciatica    ED Discharge Orders         Ordered    lidocaine (LIDODERM) 5 %  Every 24 hours  10/10/19 1309    predniSONE (DELTASONE) 20 MG tablet  Daily     10/10/19 1309           Kristen Fromm A,  PA-C 10/10/19 1313    Long, W[facility], MD 10/11/19 [phone]

## 2019-10-21 MED FILL — CONTOUR NEXT STRIPS: 25 days supply | Qty: 100 | Fill #2

## 2019-10-28 ENCOUNTER — Encounter: Payer: Self-pay | Admitting: Internal Medicine

## 2019-10-28 DIAGNOSIS — M5416 Radiculopathy, lumbar region: Secondary | ICD-10-CM | POA: Insufficient documentation

## 2019-11-14 MED FILL — CONTOUR NEXT STRIPS: 25 days supply | Qty: 100 | Fill #3

## 2019-12-04 ENCOUNTER — Other Ambulatory Visit: Payer: Self-pay | Admitting: Internal Medicine

## 2019-12-04 DIAGNOSIS — K219 Gastro-esophageal reflux disease without esophagitis: Secondary | ICD-10-CM

## 2019-12-04 DIAGNOSIS — E1142 Type 2 diabetes mellitus with diabetic polyneuropathy: Secondary | ICD-10-CM

## 2019-12-04 DIAGNOSIS — Z794 Long term (current) use of insulin: Secondary | ICD-10-CM

## 2019-12-04 NOTE — Telephone Encounter (Signed)
Need refill on Insulin Glargine (BASAGLAR KWIKPEN) 100 UNIT/ML SOPN(Expired) insulin lispro (HUMALOG KWIKPEN) 100 UNIT/ML KwikPen gabapentin (NEURONTIN) 300 MG capsule   atorvastatin (LIPITOR) 40 MG tablet    lisinopril (ZESTRIL) 5 MG tablet  omeprazole (PRILOSEC) 20 MG capsule, metFORMIN (GLUCOPHAGE XR) 500 MG 24 hr tablet    ;pt contact [phone] pt is trying to get it on the 4.00 list [place], [place] - [id number] need to re certify for [facility]

## 2019-12-06 MED ORDER — BASAGLAR KWIKPEN 100 UNIT/ML ~~LOC~~ SOPN: 70.0000 [IU] | PEN_INJECTOR | Freq: Every day | SUBCUTANEOUS | 3 refills | Status: DC

## 2019-12-06 MED ORDER — METFORMIN HCL ER 500 MG PO TB24: 1000.0000 mg | ORAL_TABLET | Freq: Two times a day (BID) | ORAL | 3 refills | Status: DC

## 2019-12-06 MED ORDER — LISINOPRIL 5 MG PO TABS: 5.0000 mg | ORAL_TABLET | Freq: Every day | ORAL | 3 refills | Status: DC

## 2019-12-06 MED ORDER — GABAPENTIN 300 MG PO CAPS: ORAL_CAPSULE | ORAL | 2 refills | Status: DC

## 2019-12-06 MED ORDER — INSULIN LISPRO (1 UNIT DIAL) 100 UNIT/ML (KWIKPEN): 18.0000 [IU] | PEN_INJECTOR | Freq: Three times a day (TID) | SUBCUTANEOUS | 3 refills | Status: DC

## 2019-12-06 MED FILL — GABAPENTIN 300 MG CAPSULE: 300 | 30 days supply | Qty: 270 | Fill #0

## 2019-12-06 MED FILL — LISINOPRIL 5 MG TABS: 5 | 30 days supply | Qty: 30 | Fill #0

## 2019-12-06 MED FILL — LANTUS SOLOSTAR 100 UNITS/M: 100 | 30 days supply | Qty: 21 | Fill #0

## 2019-12-06 MED FILL — HUMALOG 100 UNITS/ML KWIKPE: 100 | 27 days supply | Qty: 15 | Fill #0

## 2019-12-06 MED FILL — METFORMIN HCL ER 500 MG TB2: 500 | 30 days supply | Qty: 120 | Fill #0

## 2019-12-06 NOTE — Telephone Encounter (Signed)
Patient calling back regarding refills. She is now completely out of insulin. Will forward to PCP and Attending Pool. Needs these sent to [facility] under IM Program. Patient is in process of re-certifying with [facility]. L. Ozella Comins, BSN, RN-BC

## 2019-12-06 NOTE — Telephone Encounter (Signed)
Patient notified refills have been sent and is very appreciative. Hubbard Hartshorn, BSN, RN-BC

## 2019-12-10 MED FILL — CONTOUR NEXT STRIPS: 25 days supply | Qty: 100 | Fill #4

## 2020-01-01 MED FILL — CONTOUR NEXT STRIPS: 25 days supply | Qty: 100 | Fill #5

## 2020-01-10 LAB — HM DIABETES EYE EXAM

## 2020-01-10 MED FILL — HUMALOG 100 UNITS/ML KWIKPE: 100 | 27 days supply | Qty: 15 | Fill #1

## 2020-01-10 MED FILL — LISINOPRIL 5 MG TABS: 5 | 30 days supply | Qty: 30 | Fill #1

## 2020-01-10 MED FILL — LANTUS SOLOSTAR 100 UNITS/M: 100 | 30 days supply | Qty: 21 | Fill #1

## 2020-01-20 DIAGNOSIS — M545 Low back pain, unspecified: Secondary | ICD-10-CM | POA: Insufficient documentation

## 2020-01-29 MED FILL — GABAPENTIN 300 MG CAPSULE: 300 | 30 days supply | Qty: 270 | Fill #1

## 2020-01-29 MED FILL — METFORMIN HCL ER 500 MG TB2: 500 | 30 days supply | Qty: 120 | Fill #1

## 2020-02-19 MED FILL — LANTUS SOLOSTAR 100 UNITS/M: 100 | 30 days supply | Qty: 21 | Fill #2

## 2020-02-19 MED FILL — GABAPENTIN 300 MG CAPSULE: 300 | 30 days supply | Qty: 270 | Fill #1

## 2020-02-19 MED FILL — HUMALOG 100 UNITS/ML KWIKPE: 100 | 27 days supply | Qty: 15 | Fill #2

## 2020-02-19 MED FILL — CONTOUR NEXT STRIPS: 25 days supply | Qty: 100 | Fill #6

## 2020-02-19 MED FILL — METFORMIN HCL ER 500 MG TB2: 500 | 30 days supply | Qty: 120 | Fill #1

## 2020-02-24 ENCOUNTER — Emergency Department (HOSPITAL_COMMUNITY): Payer: Self-pay

## 2020-02-24 ENCOUNTER — Telehealth: Payer: Self-pay | Admitting: *Deleted

## 2020-02-24 ENCOUNTER — Observation Stay (HOSPITAL_COMMUNITY)
Admission: EM | Admit: 2020-02-24 | Discharge: 2020-02-25 | Disposition: A | Discharge status: Home / Self Care | Payer: Self-pay | Attending: Student in an Organized Health Care Education/Training Program | Admitting: Student in an Organized Health Care Education/Training Program

## 2020-02-24 ENCOUNTER — Other Ambulatory Visit: Payer: Self-pay

## 2020-02-24 ENCOUNTER — Encounter (HOSPITAL_COMMUNITY): Payer: Self-pay | Admitting: Emergency Medicine

## 2020-02-24 DIAGNOSIS — Z87891 Personal history of nicotine dependence: Secondary | ICD-10-CM | POA: Insufficient documentation

## 2020-02-24 DIAGNOSIS — Z20822 Contact with and (suspected) exposure to covid-19: Secondary | ICD-10-CM | POA: Insufficient documentation

## 2020-02-24 DIAGNOSIS — Z794 Long term (current) use of insulin: Secondary | ICD-10-CM | POA: Insufficient documentation

## 2020-02-24 DIAGNOSIS — Q211 Atrial septal defect: Secondary | ICD-10-CM | POA: Insufficient documentation

## 2020-02-24 DIAGNOSIS — E785 Hyperlipidemia, unspecified: Secondary | ICD-10-CM | POA: Insufficient documentation

## 2020-02-24 DIAGNOSIS — R079 Chest pain, unspecified: Secondary | ICD-10-CM | POA: Insufficient documentation

## 2020-02-24 DIAGNOSIS — I2510 Atherosclerotic heart disease of native coronary artery without angina pectoris: Secondary | ICD-10-CM | POA: Insufficient documentation

## 2020-02-24 DIAGNOSIS — E119 Type 2 diabetes mellitus without complications: Secondary | ICD-10-CM | POA: Insufficient documentation

## 2020-02-24 DIAGNOSIS — I10 Essential (primary) hypertension: Secondary | ICD-10-CM | POA: Insufficient documentation

## 2020-02-24 DIAGNOSIS — K219 Gastro-esophageal reflux disease without esophagitis: Principal | ICD-10-CM | POA: Insufficient documentation

## 2020-02-24 DIAGNOSIS — E1165 Type 2 diabetes mellitus with hyperglycemia: Secondary | ICD-10-CM

## 2020-02-24 DIAGNOSIS — J45909 Unspecified asthma, uncomplicated: Secondary | ICD-10-CM | POA: Insufficient documentation

## 2020-02-24 DIAGNOSIS — Z79899 Other long term (current) drug therapy: Secondary | ICD-10-CM | POA: Insufficient documentation

## 2020-02-24 DIAGNOSIS — Z880 Allergy status to penicillin: Secondary | ICD-10-CM | POA: Insufficient documentation

## 2020-02-24 DIAGNOSIS — Z886 Allergy status to analgesic agent status: Secondary | ICD-10-CM | POA: Insufficient documentation

## 2020-02-24 LAB — BASIC METABOLIC PANEL
Anion gap: 8 (ref 5–15)
BUN: 6 mg/dL (ref 6–20)
CO2: 26 mmol/L (ref 22–32)
Calcium: 9.3 mg/dL (ref 8.9–10.3)
Chloride: 105 mmol/L (ref 98–111)
Creatinine, Ser: 0.49 mg/dL (ref 0.44–1.00)
GFR calc Af Amer: >60 mL/min (ref >60)
GFR calc non Af Amer: >60 mL/min (ref >60)
Glucose, Bld: 170 mg/dL — ABNORMAL HIGH (ref 70–99)
Potassium: 4.4 mmol/L (ref 3.5–5.1)
Sodium: 139 mmol/L (ref 135–145)

## 2020-02-24 LAB — CBC WITH DIFFERENTIAL/PLATELET
Abs Immature Granulocytes: 0.02 10*3/uL (ref 0.00–0.07)
Basophils Absolute: 0 10*3/uL (ref 0.0–0.1)
Basophils Relative: 0 %
Eosinophils Absolute: 0.1 10*3/uL (ref 0.0–0.5)
Eosinophils Relative: 1 %
HCT: 41.1 % (ref 36.0–46.0)
Hemoglobin: 13 g/dL (ref 12.0–15.0)
Immature Granulocytes: 0 %
Lymphocytes Relative: 48 %
Lymphs Abs: 3.4 10*3/uL (ref 0.7–4.0)
MCH: 31.3 pg (ref 26.0–34.0)
MCHC: 31.6 g/dL (ref 30.0–36.0)
MCV: 99 fL (ref 80.0–100.0)
Monocytes Absolute: 0.4 10*3/uL (ref 0.1–1.0)
Monocytes Relative: 6 %
Neutro Abs: 3.2 10*3/uL (ref 1.7–7.7)
Neutrophils Relative %: 45 %
Platelets: 237 10*3/uL (ref 150–400)
RBC: 4.15 MIL/uL (ref 3.87–5.11)
RDW: 12.8 % (ref 11.5–15.5)
WBC: 7.1 10*3/uL (ref 4.0–10.5)
nRBC: 0 % (ref 0.0–0.2)

## 2020-02-24 LAB — TROPONIN I (HIGH SENSITIVITY)
Troponin I (High Sensitivity): 3 ng/L (ref <18)
Troponin I (High Sensitivity): 5 ng/L (ref <18)

## 2020-02-24 LAB — HEMOGLOBIN A1C
Hgb A1c MFr Bld: 10.1 % — ABNORMAL HIGH (ref 4.8–5.6)
Mean Plasma Glucose: 243.17 mg/dL

## 2020-02-24 LAB — CBG MONITORING, ED: Glucose-Capillary: 201 mg/dL — ABNORMAL HIGH (ref 70–99)

## 2020-02-24 MED ORDER — HEPARIN BOLUS VIA INFUSION
4000.0000 [IU] | Freq: Once | INTRAVENOUS | Status: AC
  Administered 2020-02-24: 4000 [IU] via INTRAVENOUS
  Filled 2020-02-24: qty 4000

## 2020-02-24 MED ORDER — PROMETHAZINE HCL 25 MG PO TABS: 12.5000 mg | ORAL_TABLET | Freq: Four times a day (QID) | ORAL | Status: DC | PRN

## 2020-02-24 MED ORDER — INSULIN GLARGINE 100 UNIT/ML ~~LOC~~ SOLN
50.0000 [IU] | Freq: Every day | SUBCUTANEOUS | Status: DC
  Administered 2020-02-24: 50 [IU] via SUBCUTANEOUS
  Filled 2020-02-24 (×2): qty 0.5

## 2020-02-24 MED ORDER — DICYCLOMINE HCL 10 MG/5ML PO SOLN
10.0000 mg | Freq: Once | ORAL | Status: AC
  Administered 2020-02-24: 10 mg via ORAL
  Filled 2020-02-24: qty 5

## 2020-02-24 MED ORDER — NITROGLYCERIN IN D5W 200-5 MCG/ML-% IV SOLN
0.0000 ug/min | INTRAVENOUS | Status: DC
  Filled 2020-02-24: qty 250

## 2020-02-24 MED ORDER — ACETAMINOPHEN 325 MG PO TABS: 650.0000 mg | ORAL_TABLET | Freq: Four times a day (QID) | ORAL | Status: DC | PRN

## 2020-02-24 MED ORDER — INSULIN ASPART 100 UNIT/ML ~~LOC~~ SOLN
0.0000 [IU] | Freq: Every day | SUBCUTANEOUS | Status: DC
  Administered 2020-02-24: 2 [IU] via SUBCUTANEOUS

## 2020-02-24 MED ORDER — HEPARIN (PORCINE) 25000 UT/250ML-% IV SOLN
1550.0000 [IU]/h | INTRAVENOUS | Status: DC
  Administered 2020-02-24: 1300 [IU]/h via INTRAVENOUS
  Filled 2020-02-24: qty 250

## 2020-02-24 MED ORDER — PANTOPRAZOLE SODIUM 40 MG PO TBEC
40.0000 mg | DELAYED_RELEASE_TABLET | Freq: Every day | ORAL | Status: DC
  Administered 2020-02-24 – 2020-02-25 (×2): 40 mg via ORAL
  Filled 2020-02-24 (×2): qty 1

## 2020-02-24 MED ORDER — INSULIN ASPART 100 UNIT/ML ~~LOC~~ SOLN
0.0000 [IU] | Freq: Three times a day (TID) | SUBCUTANEOUS | Status: DC
  Administered 2020-02-25: 11 [IU] via SUBCUTANEOUS
  Administered 2020-02-25: 5 [IU] via SUBCUTANEOUS

## 2020-02-24 MED ORDER — ATORVASTATIN CALCIUM 40 MG PO TABS
40.0000 mg | ORAL_TABLET | Freq: Every day | ORAL | Status: DC
  Administered 2020-02-24 – 2020-02-25 (×2): 40 mg via ORAL
  Filled 2020-02-24 (×2): qty 1

## 2020-02-24 MED ORDER — OMEGA-3-ACID ETHYL ESTERS 1 G PO CAPS
1.0000 g | ORAL_CAPSULE | Freq: Every day | ORAL | Status: DC
  Administered 2020-02-24 – 2020-02-25 (×2): 1 g via ORAL
  Filled 2020-02-24 (×2): qty 1

## 2020-02-24 MED ORDER — ASPIRIN 325 MG PO TABS
325.0000 mg | ORAL_TABLET | Freq: Every day | ORAL | Status: DC
  Administered 2020-02-24 – 2020-02-25 (×2): 325 mg via ORAL
  Filled 2020-02-24 (×2): qty 1

## 2020-02-24 MED ORDER — ALUM & MAG HYDROXIDE-SIMETH 200-200-20 MG/5ML PO SUSP
30.0000 mL | Freq: Once | ORAL | Status: AC
  Administered 2020-02-24: 30 mL via ORAL
  Filled 2020-02-24: qty 30

## 2020-02-24 MED ORDER — LISINOPRIL 5 MG PO TABS
5.0000 mg | ORAL_TABLET | Freq: Every day | ORAL | Status: DC
  Administered 2020-02-25: 5 mg via ORAL
  Filled 2020-02-24 (×2): qty 1

## 2020-02-24 MED ORDER — SENNOSIDES-DOCUSATE SODIUM 8.6-50 MG PO TABS: 1.0000 | ORAL_TABLET | Freq: Every evening | ORAL | Status: DC | PRN

## 2020-02-24 MED ORDER — GABAPENTIN 300 MG PO CAPS
300.0000 mg | ORAL_CAPSULE | Freq: Three times a day (TID) | ORAL | Status: DC
  Administered 2020-02-24 – 2020-02-25 (×2): 300 mg via ORAL
  Filled 2020-02-24: qty 1
  Filled 2020-02-24: qty 3

## 2020-02-24 MED ORDER — ACETAMINOPHEN 650 MG RE SUPP: 650.0000 mg | Freq: Four times a day (QID) | RECTAL | Status: DC | PRN

## 2020-02-24 MED ORDER — LISINOPRIL 10 MG PO TABS: 5.0000 mg | ORAL_TABLET | Freq: Every day | ORAL | Status: DC

## 2020-02-24 NOTE — ED Notes (Signed)
Pt to and from bathroom with a smooth and steady gait. No distress noted.

## 2020-02-24 NOTE — Progress Notes (Signed)
ANTICOAGULATION CONSULT NOTE - Initial Consult  Pharmacy Consult for heparin Indication: chest pain/ACS  Allergies  Allergen Reactions  . Aspirin Nausea And Vomiting  . Ibuprofen Nausea And Vomiting and Other (See Comments)    Abdominal pain  . Penicillins Other (See Comments)    unknown    Patient Measurements: Height: 6' (182.9 cm) Weight: (!) 302 lb 4 oz (137.1 kg) IBW/kg (Calculated) : 73.1 Heparin Dosing Weight: 105kg  Vital Signs:    Labs: No results for input(s): HGB, HCT, PLT, APTT, LABPROT, INR, HEPARINUNFRC, HEPRLOWMOCWT, CREATININE, CKTOTAL, CKMB, TROPONINIHS in the last 72 hours.  CrCl cannot be calculated (Patient's most recent lab result is older than the maximum 21 days allowed.).   Medical History: Past Medical History:  Diagnosis Date  . Anemia   . Asthma 11/01/2010  . BPPV (benign paroxysmal positional vertigo) 03/07/2013  . Diabetes mellitus    since 1983  . Elevated TSH 05/13/2013  . GERD (gastroesophageal reflux disease)   . Hypertension   . Left elbow pain 05/05/2016  . Left shoulder pain 05/05/2016  . Osteomyelitis ([place])   . Pneumonia    as a child  . Wrist pain 04/11/2013   Assessment: 63 YOF presenting with CP, not on anticoagulation PTA, pharmacy consulted to start heparin for ACS r/o.    Goal of Therapy:  Heparin level 0.3-0.7 units/ml Monitor platelets by anticoagulation protocol: Yes   Plan:  Heparin 4000 units IV x 1, and gtt at 1300 units/hr F/u 6 hour heparin level  Bertis Ruddy, PharmD Clinical Pharmacist ED Pharmacist Phone # [phone] 02/24/2020 5:16 PM

## 2020-02-24 NOTE — ED Notes (Signed)
Patient transported to XR. 

## 2020-02-24 NOTE — Telephone Encounter (Signed)
t called and stated she has been having chest pain for 2-3 days, worse today- shoulder, arm, chest. Ask to go to ED via someone else driving or call [phone], she was agreeable

## 2020-02-24 NOTE — ED Triage Notes (Signed)
Pt BIB [facility] for intermittent CP that started 3 days ago. Denies SOB but says she has episodes of nausea sometimes, does not think it is related to the CP. Hx of DM and HTN. Has been taking her meds as prescribed

## 2020-02-24 NOTE — ED Notes (Signed)
PIVC placed on the RAC with a 18G which had positive blood return. Medications infusing per MAR. Tolerating well.

## 2020-02-24 NOTE — H&P (Addendum)
Date: 02/24/2020               Patient Name:  Ayushi Holzem MRN: [id number]  DOB: Oct 21, 1961 Age / Sex: 59 y.o., female   PCP: Katherine Roan, MD         Medical Service: Internal Medicine Teaching Service         Attending Physician: Dr. Evette Doffing, Mallie Mussel, *    First Contact: Dr. Marianna Payment Pager: [phone]  Second Contact: Dr. Eileen Stanford Pager: [phone]       After Hours (After 5p/  First Contact Pager: [phone]  weekends / holidays): Second Contact Pager: [phone]   Chief Complaint: Chest Pain  History of Present Illness:  Ms. Analiza Fien is a 59 y/o female with a PMH DM, GERD, Asthma, BPPV, who presents with chest pain. Patient states that the pain began 3 days ago. Describes the pain as pressure like/burning, present under her breasts with radiation down left arm to neck and jaw. Lasting 20-28min per episode. Pain does not change in quality with exertion, deep breaths, changes in position. Occurs at both exertion and rest. Initially, Mr. Padley thought it was acid reflux and she took omeprazole which did not alleviate her pain. She endorses new onset nausea. She denies dyspnea, palpitations, vomiting, lightheadedness/dizziness or changes in bowel habits. Denies sedentary lifestyle, travelling long distances, or cancer.   ED Course: Troponin, BMP, CBC unremarkable. Received heparin.    Meds: Current Meds  Medication Sig   gabapentin (NEURONTIN) 300 MG capsule TAKE 3 CAPSULES BY MOUTH 3 TIMES DAILY.   insulin glargine (LANTUS) 100 UNIT/ML Solostar Pen Inject 70 Units into the skin at bedtime.   insulin lispro (HUMALOG KWIKPEN) 100 UNIT/ML KwikPen Inject 0.18 mLs (18 Units total) into the skin 3 (three) times daily with meals.   lidocaine (LIDODERM) 5 % Place 1 patch onto the skin daily. Remove & Discard patch within 12 hours or as directed by MD (Patient taking differently: Place 1 patch onto the skin daily as needed (pain). Remove & Discard patch within 12 hours or as directed  by MD)   lisinopril (ZESTRIL) 5 MG tablet Take 1 tablet (5 mg total) by mouth daily.   metFORMIN (GLUCOPHAGE XR) 500 MG 24 hr tablet Take 2 tablets (1,000 mg total) by mouth 2 (two) times daily with a meal. (Patient taking differently: Take 2,000 mg by mouth every evening. )   Multiple Vitamin (MULTIVITAMIN WITH MINERALS) TABS tablet Take 1 tablet by mouth every evening.    Omega-3 Fatty Acids (FISH OIL) 1000 MG CAPS Take 1 capsule by mouth every evening.   omeprazole (PRILOSEC) 20 MG capsule TAKE 1 Capsule BY MOUTH ONCE DAILY   Probiotic Product (PROBIOTIC PO) Take 1 capsule by mouth daily.     Allergies: Allergies as of 02/24/2020 - Review Complete 02/24/2020  Allergen Reaction Noted   Aspirin Nausea And Vomiting 12/03/2010   Ibuprofen Nausea And Vomiting and Other (See Comments) 11/01/2010   Penicillins Other (See Comments) 11/01/2010   Past Medical History:  Diagnosis Date   Anemia    Asthma 11/01/2010   BPPV (benign paroxysmal positional vertigo) 03/07/2013   Diabetes mellitus    since 1983   Elevated TSH 05/13/2013   GERD (gastroesophageal reflux disease)    Hypertension    Left elbow pain 05/05/2016   Left shoulder pain 05/05/2016   Osteomyelitis ([place])    Pneumonia    as a child   Wrist pain 04/11/2013    Family History:  family history includes Dementia in her mother; Diabetes in her daughter and father; Hypertension in her father and mother. Diabetes: daughers, father, paternal grandmother  CAD: father at 74yrs, paternal grandmother Mother chf  HTN: mother and father  Social History:  Housing: Lives in [place] with daughter,has 3 daughters and 1 son, temporary custody of grandson Tobacco: Few cigarettes when she was 59 yrs old ETOH: Occasional glass of wine Recreational Drugs: Denies drug hx Patient had her covid J&J vaccination on 02/05/20  Review of Systems: A complete ROS was negative except as per HPI.   Physical Exam: Blood pressure (!) 157/83, pulse 95,  temperature 98.2 F (36.8 C), temperature source Oral, resp. rate 18, height 6' (1.829 m), weight (!) 137.1 kg, last menstrual period 06/23/2014, SpO2 98 %.  Physical Exam Vitals reviewed.  Constitutional:      General: She is not in acute distress.    Appearance: She is obese.  HENT:     Head: Normocephalic and atraumatic.  Cardiovascular:     Rate and Rhythm: Normal rate and regular rhythm.     Heart sounds: Normal heart sounds. No murmur. No friction rub. No gallop.   Pulmonary:     Effort: Pulmonary effort is normal. No tachypnea or respiratory distress.     Breath sounds: Normal breath sounds. No decreased breath sounds, wheezing, rhonchi or rales.  Abdominal:     General: Bowel sounds are normal.     Palpations: Abdomen is soft.  Musculoskeletal:     Right lower leg: No edema.     Left lower leg: No edema.  Neurological:     Mental Status: She is alert and oriented to person, place, and time.     Cranial Nerves: No cranial nerve deficit.     Motor: No weakness.  Psychiatric:        Mood and Affect: Mood normal.        Behavior: Behavior normal.     EKG: personally reviewed my interpretation is NSR  CXR: personally reviewed my interpretation is no acute cardiopulmonary disease.   Assessment & Plan by Problem: Principal Problem:   Chest pain Active Problems:   GERD (gastroesophageal reflux disease)   Diabetes ([place])  Hargun Udoh is a 59 year old female, PMH DM, BPPV, Asthma admitted for chest pain.   Typical Chest Pain:  Patient presenting to the ED with CP, occurs at rest and execution, L sided heavy chest pain radiating to the L arm and jaw, occurring for 15-20 minute each episode. Unrelieved by home PPI. Presentation concerning for unstable angina. Patient also has an impressive history and risks factors of diabetes, hypertension, hyperlipidemia, and family history of heart disease. While initial EKG was NSR and troponins were flat, further investigation is  reasonable due to her presentation and history. She denies traveling long distances, sedentary lifestyle or history of cancer and her vitals are stable making PE less likely. Cardiology was consulted in the ED.  - Cardiology consulted, we appreciate their recommendations.  - Aspirin 325 mg QD - GI Cocktail  - Stress Test - TTE - Monitor vital signs - PT/OT Eval and treat - Lipid panel  Diabetes Mellitis:  - Lantus 50U  - SSI  Hypertension:  Normotensive ranging 119-149/76-91.   Dispo: Admit patient to Observation with expected length of stay less than 2 midnights.  Signed: Maudie Mercury, MD 02/24/2020, 10:35 PM

## 2020-02-24 NOTE — Telephone Encounter (Signed)
Thank you. Agree.

## 2020-02-24 NOTE — ED Notes (Signed)
Admitting at bedside 

## 2020-02-24 NOTE — Consult Note (Signed)
[facility]   CARDIOLOGY CONSULT NOTE  [name] MRN: [id number] DOB/AGE: 1961-08-22 59 y.o.  Admit date: 02/24/2020 Primary Cardiologist: None Chief Complaint: Chest Pain Requesting: ED  HPI: [name] is a 59 yo woman with DMII, HTN, morbid obesity, asthma, and GERD who presented to the ED for evaluation of several days of chest pain.   Patient states that the pain began approximately 3 days ago. She describes the pain as pressure like/burning, present under her L breast and rib cage with radiation to her left forearm, as well as to her left neck, shoulder, and jaw. Patient states that pain comes on randomly throughout the day and can occur while she is exerting herself or while she is lying down.  There does not seem to be any alleviating or exacerbating factors that she can identify. Pain does not change in quality with exertion, deep breaths, changes in position. She denies dyspnea, palpitations, vomiting, lightheadedness/dizziness or changes in bowel habits.  She reports that her current symptoms are similar to her her episodes of acid reflux and she attempted to treated as such with her home PPI.  However, after her symptoms did not resolve after a couple of days she called into her PCPs office with her complaints and was urged to go to the ED for further evaluation.   ED Course: ECG, troponin, BMP, and CBC were unremarkable. Received heparin.    Past Medical History:  Diagnosis Date  . Anemia   . Asthma 11/01/2010  . BPPV (benign paroxysmal positional vertigo) 03/07/2013  . Diabetes mellitus    since 1983  . Elevated TSH 05/13/2013  . GERD (gastroesophageal reflux disease)   . Hypertension   . Left elbow pain 05/05/2016  . Left shoulder pain 05/05/2016  . Osteomyelitis ([place])   . Pneumonia    as a child  . Wrist pain 04/11/2013    Past Surgical History:  Procedure Laterality Date  . ACHILLES TENDON LENGTHENING Left 08/29/2014   Procedure: Left Achilles Lengthening;   Surgeon: Newt Minion, MD;  Location: [facility];  Service: Orthopedics;  Laterality: Left;  . AMPUTATION Left 02/19/2013   Procedure: Amputation of Left Great Toe;  Surgeon: Mcarthur Rossetti, MD;  Location: [facility];  Service: Orthopedics;  Laterality: Left;  . AMPUTATION Left 08/29/2014   Procedure: Left Foot 2nd and 1st Toe Amputation;  Surgeon: Newt Minion, MD;  Location: [facility];  Service: Orthopedics;  Laterality: Left;  . CESAREAN SECTION     x 4  . TUBAL LIGATION      Allergies  Allergen Reactions  . Aspirin Nausea And Vomiting  . Ibuprofen Nausea And Vomiting and Other (See Comments)    Abdominal pain  . Penicillins Other (See Comments)    Unknown Did it involve swelling of the face/tongue/throat, SOB, or low BP? Unk Did it involve sudden or severe rash/hives, skin peeling, or any reaction on the inside of your mouth or nose? Unk  Did you need to seek medical attention at a hospital or doctor's office? Unk When did it last happen?Childhood If all above answers are "NO", may proceed with cephalosporin use.    (Not in a hospital admission)  Family History  Problem Relation Age of Onset  . Diabetes Father   . Hypertension Father   . Hypertension Mother   . Dementia Mother   . Diabetes Daughter     Social History   Socioeconomic History  . Marital status: Divorced    Spouse name:  Not on file  . Number of children: Not on file  . Years of education: Not on file  . Highest education level: Not on file  Occupational History  . Not on file  Tobacco Use  . Smoking status: Former Smoker    Quit date: 11/28/1962    Years since quitting: 57.2  . Smokeless tobacco: Never Used  Substance and Sexual Activity  . Alcohol use: No    Alcohol/week: 0.0 standard drinks  . Drug use: No  . Sexual activity: Not on file  Other Topics Concern  . Not on file  Social History Narrative  . Not on file   Social Determinants of Health   Financial Resource Strain:   . Difficulty  of Paying Living Expenses:   Food Insecurity:   . Worried About Charity fundraiser in the Last Year:   . Arboriculturist in the Last Year:   Transportation Needs:   . Film/video editor (Medical):   [facility] Lack of Transportation (Non-Medical):   Physical Activity:   . Days of Exercise per Week:   . Minutes of Exercise per Session:   Stress:   . Feeling of Stress :   Social Connections:   . Frequency of Communication with Friends and Family:   . Frequency of Social Gatherings with Friends and Family:   . Attends Religious Services:   . Active Member of Clubs or Organizations:   . Attends Archivist Meetings:   [facility] Marital Status:   Intimate Partner Violence:   . Fear of Current or Ex-Partner:   . Emotionally Abused:   [facility] Physically Abused:   . Sexually Abused:      Review of Systems: [y] = yes, [ ]  = no       General: Weight gain [] ; Weight loss [ ] ; Anorexia [ ] ; Fatigue [ ] ; Fever [ ] ; Chills [ ] ; Weakness [ ]     Cardiac: Negative except as stated in HPI  Pulmonary: Cough [ ] ; Wheezing[ ] ; Hemoptysis[ ] ; Sputum [ ] ; Snoring [ ]     GI: Nausea [y]; Dysphagia[ ] ; Melena[ ] ; Hematochezia [ ] ; Heartburn[ ] ; Abdominal pain [ ] ; Constipation [ ] ; Diarrhea [ ] ; BRBPR [ ]     GU: Hematuria[ ] ; Dysuria [ ] ; Nocturia[ ]   Vascular: Pain in legs with walking [ ] ; Pain in feet with lying flat [ ] ; Non-healing sores [ ] ; Stroke [ ] ; TIA [ ] ; Slurred speech [ ] ;    Neuro: Headaches[ ] ; Vertigo[ ] ; Seizures[ ] ; Paresthesias[ ] ;Blurred vision [ ] ; Diplopia [ ] ; Vision changes [ ]     Ortho/Skin: Arthritis [ ] ; Joint pain [ ] ; Muscle pain [ ] ; Joint swelling [ ] ; Back Pain [ ] ; Rash [ ]     Psych: Depression[ ] ; Anxiety[ ]     Heme: Bleeding problems [ ] ; Clotting disorders [ ] ; Anemia [ ]     Endocrine: Diabetes [ ] ; Thyroid dysfunction[ ]   Physical Exam: Blood pressure (!) 149/91, pulse 85, temperature 98.2 F (36.8 C), temperature source Oral, resp. rate 18, height 6' (1.829  m), weight (!) 137.1 kg, last menstrual period 06/23/2014, SpO2 99 %.   GENERAL: Patient is afebrile, Vital signs reviewed, Well appearing, Patient appears comfortable, Alert and lucid. EYES: Normal inspection. HEENT:  normocephalic, atraumatic , normal ENT inspection. ORAL:  Moist NECK:  supple , normal inspection. CARD:  regular rate and rhythm, heart sounds normal. RESP:  no respiratory distress, breath sounds normal. ABD: soft, nontender  to palpation, BS present, soft, no organomegaly or masses . BACK: non-tender. No CVA tenderness. MUSC:  normal ROM, non-tender, no pedal edema . SKIN: color normal, no rash, warm, dry . NEURO: awake & alert, lucid, no motor/sensory deficit. Gait stable. PSYCH: mood/affect normal.  Labs: Lab Results  Component Value Date   BUN 6 02/24/2020   Lab Results  Component Value Date   CREATININE 0.49 02/24/2020   Lab Results  Component Value Date   NA 139 02/24/2020   K 4.4 02/24/2020   CL 105 02/24/2020   CO2 26 02/24/2020   Lab Results  Component Value Date   TROPONINI <0.01        NO INDICATION OF MYOCARDIAL INJURY. 01/10/2011   Lab Results  Component Value Date   WBC 7.1 02/24/2020   HGB 13.0 02/24/2020   HCT 41.1 02/24/2020   MCV 99.0 02/24/2020   PLT 237 02/24/2020   Lab Results  Component Value Date   CHOL 143 04/22/2016   HDL 55 04/22/2016   LDLCALC 68 04/22/2016   TRIG 101 04/22/2016   CHOLHDL 2.6 04/22/2016   Lab Results  Component Value Date   ALT 31 01/28/2019   AST 20 01/28/2019   ALKPHOS 92 01/28/2019   BILITOT 0.6 01/28/2019      Radiology:   CXR:  IMPRESSION: No active cardiopulmonary disease.  EKG: NSR. No dynamic ST changes.   ASSESSMENT AND PLAN:  [name] is a 59 yo woman with DMII, HTN, morbid obesity, asthma, and GERD who presented to the ED for evaluation of several days of chest pain.   # Atypical Chest Pain :: Patient with overall atypical symptoms, however does have several risk factors  including age, family history, obesity, poorly controlled diabetes, and hypertension.  Affected her ECG and troponins are unremarkable or reassuring.  Low index of suspicion for true angina, but will proceed cautiously with noninvasive evaluation.  - Admit to medicine - Plan for stress echo in AM - Hold all AV nodal blocking agents - NPO at midnight - Continue getting serial ECGs for changes in pain quality/severity - Continue to monitor on telemetry  Signed: Clois Dupes 02/24/2020, 8:26 PM

## 2020-02-24 NOTE — ED Provider Notes (Signed)
[facility] EMERGENCY DEPARTMENT Provider Note   CSN: [id number] Arrival date & time: 02/24/20  1555     History Chief Complaint  Patient presents with  . Chest Pain    [name] is a 59 y.o. female.  HPI   58 year old female with chest pain.  Onset 3 days ago.  Intermittent.  Describes pain in the center of her chest with radiation to her left shoulder, left arm and neck.  Associated with mild nausea and feeling hot like "a hot flash."  Symptoms have waxed and waned without appreciable exacerbating relieving factors.  She has had with activity as well with while at rest.  Symptoms last anywhere from about 15 minutes to up to about an hour.  No acute respiratory complaints.  No fevers or chills.  No unusual leg pain or swelling.  She reports that her blood sugars have been unusually high for her over the past several days in 300s.  She reports compliance with her medications.  No history of any cardiac disease that she is aware of.  She initially thought her symptoms may be from reflux.  She has been taking omeprazole twice a day in the last 2 days without improvement.  Past Medical History:  Diagnosis Date  . Anemia   . Asthma 11/01/2010  . BPPV (benign paroxysmal positional vertigo) 03/07/2013  . Diabetes mellitus    since 1983  . Elevated TSH 05/13/2013  . GERD (gastroesophageal reflux disease)   . Hypertension   . Left elbow pain 05/05/2016  . Left shoulder pain 05/05/2016  . Osteomyelitis ([place])   . Pneumonia    as a child  . Wrist pain 04/11/2013    Patient Active Problem List   Diagnosis Date Noted  . Strain of lumbar paraspinal muscle, initial encounter 09/10/2019  . Hand pain, right 03/14/2019  . Diabetes ([place]) 07/26/2018  . Long term current use of opiate analgesic 01/31/2017  . Opioid dependence ([place]) 11/27/2016  . Morton neuroma 11/02/2016  . Broken teeth 11/02/2016  . Hyperlipidemia associated with type 2 diabetes mellitus ([place]) 04/22/2016  .  Severe obesity (BMI 35.0-39.9) 01/22/2016  . GERD (gastroesophageal reflux disease) 03/03/2014  . Health care maintenance 01/06/2014  . Neuropathy in diabetes ([place]) 11/01/2010  . ASTHMA 11/01/2010  . Hypertension associated with diabetes ([place]) 11/28/2008  . Diabetic osteomyelitis ([place]) 11/28/1988    Past Surgical History:  Procedure Laterality Date  . ACHILLES TENDON LENGTHENING Left 08/29/2014   Procedure: Left Achilles Lengthening;  Surgeon: Newt Minion, MD;  Location: [facility];  Service: Orthopedics;  Laterality: Left;  . AMPUTATION Left 02/19/2013   Procedure: Amputation of Left Great Toe;  Surgeon: Mcarthur Rossetti, MD;  Location: [facility];  Service: Orthopedics;  Laterality: Left;  . AMPUTATION Left 08/29/2014   Procedure: Left Foot 2nd and 1st Toe Amputation;  Surgeon: Newt Minion, MD;  Location: [facility];  Service: Orthopedics;  Laterality: Left;  . CESAREAN SECTION     x 4  . TUBAL LIGATION       OB History   No obstetric history on file.     Family History  Problem Relation Age of Onset  . Diabetes Father   . Hypertension Father   . Hypertension Mother   . Dementia Mother   . Diabetes Daughter     Social History   Tobacco Use  . Smoking status: Former Smoker    Quit date: 11/28/1962    Years since quitting: 57.2  .  Smokeless tobacco: Never Used  Substance Use Topics  . Alcohol use: No    Alcohol/week: 0.0 standard drinks  . Drug use: No    Home Medications Prior to Admission medications   Medication Sig Start Date End Date Taking? Authorizing Provider  atorvastatin (LIPITOR) 40 MG tablet Take 1 tablet (40 mg total) by mouth daily. 12/03/18   Katherine Roan, MD  blood glucose meter kit and supplies KIT Dispense based on patient and insurance preference. Use up to four times daily as directed. (FOR ICD-9 250.00, 250.01). 07/09/18   Agyei, Caprice Kluver, MD  gabapentin (NEURONTIN) 300 MG capsule TAKE 3 CAPSULES BY MOUTH 3 TIMES DAILY. 12/06/19   Axel Filler, MD  glucose blood (CONTOUR NEXT TEST) test strip Use to check blood sugar 4 times daily. DIAG CODE E 11.40. insulin dependent 08/29/19   Katherine Roan, MD  Insulin Glargine (BASAGLAR KWIKPEN) 100 UNIT/ML SOPN Inject 0.7 mLs (70 Units total) into the skin daily. 12/06/19 03/05/20  Axel Filler, MD  insulin lispro (HUMALOG KWIKPEN) 100 UNIT/ML KwikPen Inject 0.18 mLs (18 Units total) into the skin 3 (three) times daily with meals. 12/06/19   Axel Filler, MD  Lancets MISC 1 each by Does not apply route 4 (four) times daily -  before meals and at bedtime. 09/30/15   Burns, Arloa Koh, MD  lidocaine (LIDODERM) 5 % Place 1 patch onto the skin daily. Remove & Discard patch within 12 hours or as directed by MD 10/10/19   Henderly, Britni A, PA-C  lisinopril (ZESTRIL) 5 MG tablet Take 1 tablet (5 mg total) by mouth daily. 12/06/19   Axel Filler, MD  metFORMIN (GLUCOPHAGE XR) 500 MG 24 hr tablet Take 2 tablets (1,000 mg total) by mouth 2 (two) times daily with a meal. 12/06/19 12/05/20  Axel Filler, MD  Multiple Vitamin (MULTIVITAMIN WITH MINERALS) TABS tablet Take 1 tablet by mouth daily as needed.     [provider]  omeprazole (PRILOSEC) 20 MG capsule TAKE 1 Capsule BY MOUTH ONCE DAILY 06/10/19   Katherine Roan, MD  tiZANidine (ZANAFLEX) 4 MG tablet Take 1 tablet (4 mg total) by mouth at bedtime as needed for muscle spasms. 09/09/19 09/08/20  Katherine Roan, MD    Allergies    Aspirin, Ibuprofen, and Penicillins  Review of Systems   Review of Systems All systems reviewed and negative, other than as noted in HPI.  Physical Exam Updated Vital Signs Ht 6' (1.829 m)   Wt (!) 137.1 kg   LMP 06/23/2014   BMI 40.99 kg/m   Physical Exam Vitals and nursing note reviewed.  Constitutional:      General: She is not in acute distress.    Appearance: She is well-developed. She is obese.  HENT:     Head: Normocephalic and atraumatic.  Eyes:      General:        Right eye: No discharge.        Left eye: No discharge.     Conjunctiva/sclera: Conjunctivae normal.  Cardiovascular:     Rate and Rhythm: Normal rate and regular rhythm.     Heart sounds: Normal heart sounds. No murmur. No friction rub. No gallop.   Pulmonary:     Effort: Pulmonary effort is normal. No respiratory distress.     Breath sounds: Normal breath sounds.  Abdominal:     General: There is no distension.     Palpations: Abdomen is soft.  Tenderness: There is no abdominal tenderness.  Musculoskeletal:        General: No tenderness.     Cervical back: Neck supple.  Skin:    General: Skin is warm and dry.  Neurological:     Mental Status: She is alert.  Psychiatric:        Behavior: Behavior normal.        Thought Content: Thought content normal.     ED Results / Procedures / Treatments   Labs (all labs ordered are listed, but only abnormal results are displayed) Labs Reviewed  BASIC METABOLIC PANEL - Abnormal; Notable for the following components:      Result Value   Glucose, Bld 170 (*)    All other components within normal limits  HEPARIN LEVEL (UNFRACTIONATED) - Abnormal; Notable for the following components:   Heparin Unfractionated 0.21 (*)    All other components within normal limits  BASIC METABOLIC PANEL - Abnormal; Notable for the following components:   Glucose, Bld 290 (*)    All other components within normal limits  CBC - Abnormal; Notable for the following components:   MCV 101.2 (*)    All other components within normal limits  LIPID PANEL - Abnormal; Notable for the following components:   Cholesterol 225 (*)    Triglycerides 188 (*)    LDL Cholesterol 123 (*)    All other components within normal limits  HEMOGLOBIN A1C - Abnormal; Notable for the following components:   Hgb A1c MFr Bld 10.1 (*)    All other components within normal limits  CBG MONITORING, ED - Abnormal; Notable for the following components:    Glucose-Capillary 201 (*)    All other components within normal limits  CBG MONITORING, ED - Abnormal; Notable for the following components:   Glucose-Capillary 322 (*)    All other components within normal limits  CBG MONITORING, ED - Abnormal; Notable for the following components:   Glucose-Capillary 218 (*)    All other components within normal limits  SARS CORONAVIRUS 2 (TAT 6-24 HRS)  CBC WITH DIFFERENTIAL/PLATELET  HIV ANTIBODY (ROUTINE TESTING W REFLEX)  TROPONIN I (HIGH SENSITIVITY)  TROPONIN I (HIGH SENSITIVITY)    EKG EKG Interpretation  Date/Time:  Monday February 24 2020 16:23:33 EDT Ventricular Rate:  93 PR Interval:    QRS Duration: 78 QT Interval:  342 QTC Calculation: 426 R Axis:   76 Text Interpretation: Sinus rhythm Right atrial enlargement Confirmed by Quintella Reichert [phone]) on 02/25/2020 3:24:08 PM   Radiology No results found.  Procedures Procedures (including critical care time)  Medications Ordered in ED Medications - No data to display  ED Course  I have reviewed the triage vital signs and the nursing notes.  Pertinent labs & imaging results that were available during my care of the patient were reviewed by me and considered in my medical decision making (see chart for details).    MDM Rules/Calculators/A&P                      57yF with CP. Reassuring ED work-up but concerning features. Admit for further evaluation.   Final Clinical Impression(s) / ED Diagnoses Final diagnoses:  Chest pain  Chest pain  Chest pain    Rx / DC Orders ED Discharge Orders    None       Virgel Manifold, MD 02/26/20 2133

## 2020-02-25 ENCOUNTER — Inpatient Hospital Stay (HOSPITAL_COMMUNITY): Payer: Self-pay

## 2020-02-25 ENCOUNTER — Other Ambulatory Visit (HOSPITAL_COMMUNITY): Payer: Self-pay

## 2020-02-25 DIAGNOSIS — R079 Chest pain, unspecified: Secondary | ICD-10-CM

## 2020-02-25 LAB — BASIC METABOLIC PANEL
Anion gap: 13 (ref 5–15)
BUN: 8 mg/dL (ref 6–20)
CO2: 24 mmol/L (ref 22–32)
Calcium: 9.1 mg/dL (ref 8.9–10.3)
Chloride: 104 mmol/L (ref 98–111)
Creatinine, Ser: 0.56 mg/dL (ref 0.44–1.00)
GFR calc Af Amer: >60 mL/min (ref >60)
GFR calc non Af Amer: >60 mL/min (ref >60)
Glucose, Bld: 290 mg/dL — ABNORMAL HIGH (ref 70–99)
Potassium: 4.2 mmol/L (ref 3.5–5.1)
Sodium: 141 mmol/L (ref 135–145)

## 2020-02-25 LAB — HEPARIN LEVEL (UNFRACTIONATED): Heparin Unfractionated: 0.21 [IU]/mL — ABNORMAL LOW (ref 0.30–0.70)

## 2020-02-25 LAB — LIPID PANEL
Cholesterol: 225 mg/dL — ABNORMAL HIGH (ref 0–200)
HDL: 64 mg/dL (ref >40)
LDL Cholesterol: 123 mg/dL — ABNORMAL HIGH (ref 0–99)
Total CHOL/HDL Ratio: 3.5 RATIO
Triglycerides: 188 mg/dL — ABNORMAL HIGH (ref <150)
VLDL: 38 mg/dL (ref 0–40)

## 2020-02-25 LAB — CBC
HCT: 40.7 % (ref 36.0–46.0)
Hemoglobin: 12.5 g/dL (ref 12.0–15.0)
MCH: 31.1 pg (ref 26.0–34.0)
MCHC: 30.7 g/dL (ref 30.0–36.0)
MCV: 101.2 fL — ABNORMAL HIGH (ref 80.0–100.0)
Platelets: 216 10*3/uL (ref 150–400)
RBC: 4.02 MIL/uL (ref 3.87–5.11)
RDW: 12.7 % (ref 11.5–15.5)
WBC: 7.7 10*3/uL (ref 4.0–10.5)
nRBC: 0 % (ref 0.0–0.2)

## 2020-02-25 LAB — CBG MONITORING, ED
Glucose-Capillary: 322 mg/dL — ABNORMAL HIGH (ref 70–99)
Glucose-Capillary: 218 mg/dL — ABNORMAL HIGH (ref 70–99)

## 2020-02-25 LAB — HIV ANTIBODY (ROUTINE TESTING W REFLEX): HIV Screen 4th Generation wRfx: NONREACTIVE

## 2020-02-25 LAB — SARS CORONAVIRUS 2 (TAT 6-24 HRS): SARS Coronavirus 2: NEGATIVE

## 2020-02-25 MED ORDER — METOPROLOL TARTRATE 5 MG/5ML IV SOLN
INTRAVENOUS | Status: AC
  Administered 2020-02-25: 10 mg
  Filled 2020-02-25: qty 10

## 2020-02-25 MED ORDER — NITROGLYCERIN 0.4 MG SL SUBL
SUBLINGUAL_TABLET | SUBLINGUAL | Status: AC
  Administered 2020-02-25: 0.8 mg
  Filled 2020-02-25: qty 2

## 2020-02-25 MED ORDER — METOPROLOL TARTRATE 5 MG/5ML IV SOLN
INTRAVENOUS | Status: AC
  Administered 2020-02-25: 5 mg
  Filled 2020-02-25: qty 5

## 2020-02-25 MED ORDER — IOHEXOL 350 MG/ML SOLN
80.0000 mL | Freq: Once | INTRAVENOUS | Status: AC | PRN
  Administered 2020-02-25: 80 mL via INTRAVENOUS

## 2020-02-25 MED ORDER — METOPROLOL TARTRATE 50 MG PO TABS
100.0000 mg | ORAL_TABLET | Freq: Every morning | ORAL | Status: DC
  Administered 2020-02-25: 100 mg via ORAL
  Filled 2020-02-25: qty 4

## 2020-02-25 MED ORDER — LIDOCAINE VISCOUS HCL 2 % MT SOLN
15.0000 mL | Freq: Once | OROMUCOSAL | Status: AC
  Administered 2020-02-25: 15 mL via ORAL
  Filled 2020-02-25: qty 15

## 2020-02-25 MED ORDER — DICYCLOMINE HCL 10 MG/5ML PO SOLN
10.0000 mg | Freq: Once | ORAL | Status: AC
  Administered 2020-02-25: 10 mg via ORAL
  Filled 2020-02-25: qty 5

## 2020-02-25 MED ORDER — ALUM & MAG HYDROXIDE-SIMETH 200-200-20 MG/5ML PO SUSP
30.0000 mL | Freq: Once | ORAL | Status: AC
  Administered 2020-02-25: 30 mL via ORAL
  Filled 2020-02-25: qty 30

## 2020-02-25 NOTE — Progress Notes (Signed)
Patient arrived to floor in NAD. Upon arrival to room, discharge orders were placed for this patient. I will proceed with completing the discharge paperwork for this patient.

## 2020-02-25 NOTE — Progress Notes (Signed)
[facility] for heparin Indication: chest pain/ACS  Allergies  Allergen Reactions  . Aspirin Nausea And Vomiting  . Ibuprofen Nausea And Vomiting and Other (See Comments)    Abdominal pain  . Penicillins Other (See Comments)    Unknown Did it involve swelling of the face/tongue/throat, SOB, or low BP? Unk Did it involve sudden or severe rash/hives, skin peeling, or any reaction on the inside of your mouth or nose? Unk  Did you need to seek medical attention at a hospital or doctor's office? Unk When did it last happen?Childhood If all above answers are "NO", may proceed with cephalosporin use.     Patient Measurements: Height: 6' (182.9 cm) Weight: (!) 302 lb 4 oz (137.1 kg) IBW/kg (Calculated) : 73.1 Heparin Dosing Weight: 105kg  Vital Signs: Temp: 98.2 F (36.8 C) (03/29 1938) Temp Source: Oral (03/29 1938) BP: 154/52 (03/30 0330) Pulse Rate: 87 (03/30 0330)  Labs: Recent Labs    02/24/20 1844 02/24/20 1940 02/25/20 0334  HGB 13.0  --  12.5  HCT 41.1  --  40.7  PLT 237  --  216  HEPARINUNFRC  --   --  0.21*  CREATININE 0.49  --  0.56  TROPONINIHS 3 5  --     Estimated Creatinine Clearance: 120.9 mL/min (by C-G formula based on SCr of 0.56 mg/dL).   Medical History: Past Medical History:  Diagnosis Date  . Anemia   . Asthma 11/01/2010  . BPPV (benign paroxysmal positional vertigo) 03/07/2013  . Diabetes mellitus    since 1983  . Elevated TSH 05/13/2013  . GERD (gastroesophageal reflux disease)   . Hypertension   . Left elbow pain 05/05/2016  . Left shoulder pain 05/05/2016  . Osteomyelitis ([place])   . Pneumonia    as a child  . Wrist pain 04/11/2013   Assessment: 24 YOF presenting with CP, not on anticoagulation PTA, pharmacy consulted to start heparin for ACS r/o.    Initial heparin level low at 0.21  Goal of Therapy:  Heparin level 0.3-0.7 units/ml Monitor platelets by anticoagulation protocol: Yes   Plan:   Increase heparin to 1550 units / hr 6 hour heparin level  Thank you Anette Guarneri, PharmD  02/25/2020 4:40 AM

## 2020-02-25 NOTE — ED Notes (Signed)
Lunch Tray Ordered @ [id number].

## 2020-02-25 NOTE — Progress Notes (Signed)
Patient discharged home with daughter

## 2020-02-25 NOTE — Evaluation (Signed)
Physical Therapy Evaluation and Discharge Patient Details Name: [name] MRN: [id number] DOB: 1961/04/24 Today's Date: 02/25/2020   History of Present Illness  Pt is a 59 y/o female admitted secondary to atypical chest pain; likely from GI cause. Pt had CT angiography of the coronaries which was negative. PMH includes DM and HTN.   Clinical Impression  Patient evaluated by Physical Therapy with no further acute PT needs identified. All education has been completed and the patient has no further questions. Pt requiring supervision for mobility tasks. Mild unsteadiness, however, no LOB noted. Pt reports feeling close to baseline. Reports daughter can assist as needed. See below for any follow-up Physical Therapy or equipment needs. PT is signing off. Thank you for this referral. If needs change, please re-consult.      Follow Up Recommendations No PT follow up    Equipment Recommendations  None recommended by PT    Recommendations for Other Services       Precautions / Restrictions Precautions Precautions: None Restrictions Weight Bearing Restrictions: No      Mobility  Bed Mobility Overal bed mobility: Independent                Transfers Overall transfer level: Needs assistance Equipment used: None Transfers: Sit to/from Stand Sit to Stand: Supervision         General transfer comment: Supervision for safety.   Ambulation/Gait Ambulation/Gait assistance: Supervision Gait Distance (Feet): 120 Feet Assistive device: None Gait Pattern/deviations: Step-through pattern;Decreased stride length Gait velocity: Decreased   General Gait Details: Mild unsteadiness, however, no LOB noted. Pt reports she feels close to baseline. Occasionally holding to rail for support. Pt asymptomatic throughout.   Stairs            Wheelchair Mobility    Modified Rankin (Stroke Patients Only)       Balance Overall balance assessment: Mild deficits observed, not  formally tested                                           Pertinent Vitals/Pain Pain Assessment: No/denies pain    Home Living Family/patient expects to be discharged to:: Private residence Living Arrangements: Children Available Help at Discharge: Family;Available 24 hours/day Type of Home: Apartment Home Access: Level entry     Home Layout: One level Home Equipment: None      Prior Function Level of Independence: Independent               Hand Dominance        Extremity/Trunk Assessment   Upper Extremity Assessment Upper Extremity Assessment: Defer to OT evaluation    Lower Extremity Assessment Lower Extremity Assessment: Overall WFL for tasks assessed    Cervical / Trunk Assessment Cervical / Trunk Assessment: Normal  Communication   Communication: No difficulties  Cognition Arousal/Alertness: Awake/alert Behavior During Therapy: WFL for tasks assessed/performed Overall Cognitive Status: Within Functional Limits for tasks assessed                                        General Comments      Exercises     Assessment/Plan    PT Assessment Patent does not need any further PT services  PT Problem List         PT Treatment Interventions  PT Goals (Current goals can be found in the Care Plan section)  Acute Rehab PT Goals Patient Stated Goal: to go home PT Goal Formulation: With patient Time For Goal Achievement: 02/25/20 Potential to Achieve Goals: Good    Frequency     Barriers to discharge        Co-evaluation               AM-PAC PT "6 Clicks" Mobility  Outcome Measure Help needed turning from your back to your side while in a flat bed without using bedrails?: None Help needed moving from lying on your back to sitting on the side of a flat bed without using bedrails?: None Help needed moving to and from a bed to a chair (including a wheelchair)?: None Help needed standing up from a  chair using your arms (e.g., wheelchair or bedside chair)?: None Help needed to walk in hospital room?: None Help needed climbing 3-5 steps with a railing? : A Little 6 Click Score: 23    End of Session Equipment Utilized During Treatment: Gait belt Activity Tolerance: Patient tolerated treatment well Patient left: in bed;with call bell/phone within reach(sitting EOB ) Nurse Communication: Mobility status PT Visit Diagnosis: Other abnormalities of gait and mobility (R26.89)    Time: [id number] PT Time Calculation (min) (ACUTE ONLY): 16 min   Charges:   PT Evaluation $PT Eval Low Complexity: 1 Low          Lou Miner, DPT  Acute Rehabilitation Services  Pager: [phone] Office: [phone]   Rudean Hitt 02/25/2020, 2:00 PM

## 2020-02-25 NOTE — Progress Notes (Signed)
Progress Note  Patient Name: [name] Date of Encounter: 02/25/2020  Primary Cardiologist: Evalina Field, MD   Subjective   Patient had some dull chest pain last night at 8PM. Plan for cardiac CT today.   Inpatient Medications    Scheduled Meds: . aspirin  325 mg Oral Daily  . atorvastatin  40 mg Oral Daily  . gabapentin  300 mg Oral TID  . insulin aspart  0-15 Units Subcutaneous TID WC  . insulin aspart  0-5 Units Subcutaneous QHS  . insulin glargine  50 Units Subcutaneous QHS  . lisinopril  5 mg Oral Daily  . metoprolol tartrate  100 mg Oral q AM  . omega-3 acid ethyl esters  1 g Oral Daily  . pantoprazole  40 mg Oral Daily   Continuous Infusions: . heparin 1,550 Units/hr (02/25/20 0458)  . nitroGLYCERIN Stopped (02/24/20 1943)   PRN Meds: acetaminophen **OR** acetaminophen, promethazine, senna-docusate   Vital Signs    Vitals:   02/25/20 0315 02/25/20 0330 02/25/20 0400 02/25/20 0415  BP: (!) 151/81 (!) 154/52 (!) 100/58 (!) 150/78  Pulse: 81 87 87 86  Resp: 18 19 17 17   Temp:      TempSrc:      SpO2: 97% 98% 98% 97%  Weight:      Height:       No intake or output data in the 24 hours ending 02/25/20 0740 Last 3 Weights 02/24/2020 09/09/2019 08/19/2019  Weight (lbs) 302 lb 4 oz 302 lb 3.2 oz 298 lb 9.6 oz  Weight (kg) 137.1 kg 137.077 kg 135.444 kg      Telemetry    NSR, HR 890s - Personally Reviewed  ECG    No new - Personally Reviewed  Physical Exam   GEN: No acute distress.   Neck: No JVD Cardiac: RRR, no murmurs, rubs, or gallops.  Respiratory: Clear to auscultation bilaterally. GI: Soft, nontender, non-distended  MS: No edema; No deformity. Neuro:  Nonfocal  Psych: Normal affect   Labs    High Sensitivity Troponin:   Recent Labs  Lab 02/24/20 1844 02/24/20 1940  TROPONINIHS 3 5      Chemistry Recent Labs  Lab 02/24/20 1844 02/25/20 0334  NA 139 141  K 4.4 4.2  CL 105 104  CO2 26 24  GLUCOSE 170* 290*  BUN 6 8   CREATININE 0.49 0.56  CALCIUM 9.3 9.1  GFRNONAA >60 >60  GFRAA >60 >60  ANIONGAP 8 13     Hematology Recent Labs  Lab 02/24/20 1844 02/25/20 0334  WBC 7.1 7.7  RBC 4.15 4.02  HGB 13.0 12.5  HCT 41.1 40.7  MCV 99.0 101.2*  MCH 31.3 31.1  MCHC 31.6 30.7  RDW 12.8 12.7  PLT 237 216    BNPNo results for input(s): BNP, PROBNP in the last 168 hours.   DDimer No results for input(s): DDIMER in the last 168 hours.   Radiology    DG Chest 2 View  Result Date: 02/24/2020 CLINICAL DATA:  Chest pain for 3 days extending to left chest wall and left neck EXAM: CHEST - 2 VIEW COMPARISON:  01/28/2019 FINDINGS: The heart size and mediastinal contours are within normal limits. Both lungs are clear. The visualized skeletal structures are unremarkable. IMPRESSION: No active cardiopulmonary disease. Electronically Signed   By: Randa Ngo M.D.   On: 02/24/2020 18:27    Cardiac Studies   Echo ordered  Patient Profile     59 y.o. female with  pmh of DM2, HTN, morbid obesity, asthma, and GERD who is being seen for chest pain.   Assessment & Plan    Atypical chest pain - RF for CAD include age, family history, obesity, DM2, and HTN - EKG unremarkable - HS troponins 3>5 - started on heparin and NTG drip - Echo ordered - patient had mild CP overnight - Plan for cardiac CTA. Lopressor to lower HR. Further recs per imaging  HTN - lisinopril 5mg  daily  - mildly elevated pressures - Lopressor added  HLD  - atorvastatin 40 mg daily - LDL 123  For questions or updates, please contact [place] [place] Please consult [email or web address] for contact info under        Signed, Marlyn Tondreau Ninfa Meeker, PA-C  02/25/2020, 7:40 AM

## 2020-02-25 NOTE — Progress Notes (Addendum)
Subjective: HD#1 Events Overnight: Admitted overnight  Patient was seen this morning on rounds. Today, [name] reports that she has been chest pain free since 10pm yesterday. She has been evaluated by the Cardiology Team who is now recommending CT angiography of the coronaries. She re-iterated that that her chest pain has been ongoing for the past 3 days prior to her presentation to the hospital. She is able to exercise on a regular basis as her grandchildren keeps her active. She does not take aspirin on a regular basis. She states that she has severe GERD and has had prior hospital admission for this. She takes Omeprazole 40mg  when she starts to feel discomfort from her GERD.   Objective:  Vital signs in last 24 hours: Vitals:   02/25/20 0945 02/25/20 1000 02/25/20 1015 02/25/20 1030  BP: 132/71 110/70 123/70 120/74  Pulse: 76 72 73 77  Resp: 17 (!) 24 17 19   Temp:      TempSrc:      SpO2: 98% 98% 96% 98%  Weight:      Height:       Supplemental O2: Room air   Physical Exam: Physical Exam  Constitutional: She is oriented to person, place, and time and well-developed, well-nourished, and in no distress.  HENT:  Head: Normocephalic and atraumatic.  Eyes: EOM are normal.  Cardiovascular: Normal rate and intact distal pulses.  No murmur heard. Pulmonary/Chest: Effort normal and breath sounds normal.  Abdominal: Soft. Bowel sounds are normal.  Musculoskeletal:     Cervical back: Normal range of motion.  Neurological: She is alert and oriented to person, place, and time.  Skin: Skin is warm and dry.    Filed Weights   02/24/20 1605  Weight: (!) 137.1 kg     Intake/Output Summary (Last 24 hours) at 02/25/2020 1104 Last data filed at 02/25/2020 0906 Gross per 24 hour  Intake 178.23 ml  Output --  Net 178.23 ml    Risk Score:  N/A  Pertinent labs/Imaging: CBC Latest Ref Rng & Units 02/25/2020 02/24/2020 01/28/2019  WBC 4.0 - 10.5 K/uL 7.7 7.1 6.5  Hemoglobin  12.0 - 15.0 g/dL 12.5 13.0 11.9(L)  Hematocrit 36.0 - 46.0 % 40.7 41.1 37.8  Platelets 150 - 400 K/uL 216 237 216    CMP Latest Ref Rng & Units 02/25/2020 02/24/2020 01/28/2019  Glucose 70 - 99 mg/dL 290(H) 170(H) 307(H)  BUN 6 - 20 mg/dL 8 6 12   Creatinine 0.44 - 1.00 mg/dL 0.56 0.49 0.81  Sodium 135 - 145 mmol/L 141 139 136  Potassium 3.5 - 5.1 mmol/L 4.2 4.4 4.2  Chloride 98 - 111 mmol/L 104 105 103  CO2 22 - 32 mmol/L 24 26 23   Calcium 8.9 - 10.3 mg/dL 9.1 9.3 8.7(L)  Total Protein 6.5 - 8.1 g/dL - - 6.9  Total Bilirubin 0.3 - 1.2 mg/dL - - 0.6  Alkaline Phos 38 - 126 U/L - - 92  AST 15 - 41 U/L - - 20  ALT 0 - 44 U/L - - 31    DG Chest 2 View  Result Date: 02/24/2020 CLINICAL DATA:  Chest pain for 3 days extending to left chest wall and left neck EXAM: CHEST - 2 VIEW COMPARISON:  01/28/2019 FINDINGS: The heart size and mediastinal contours are within normal limits. Both lungs are clear. The visualized skeletal structures are unremarkable. IMPRESSION: No active cardiopulmonary disease. Electronically Signed   By: Randa Ngo M.D.   On: 02/24/2020 18:27  Assessment/Plan:  Active Problems:   Chest pain    Patient Summary: [name] is a 59 y.o. with pertinent PMH of PMH DM, GERD, Asthma, BPPV who presented with chest pain and admit for further evaluation and management of typical cardiac chest pain on hospital day 1  Atypical Chest Pain:  It is reassuring that the patient's chest pain is resolved at this time.  Patient states that her chest pain improved with GI cocktail which makes her history of acid reflux likely as an etiology for her chest pain. Given her clinical presentation and her multiple risk factors for coronary artery disease cardiology recommends CT angiogram for further evaluation of CAD. -Patient will get CT angiogram today. -Echocardiogram pending   Diabetes Mellitis:  Continue current diabetic medication management with insulin. -Continue Lantus 15  units daily and sliding scale insulin.   Hypertension: Patient's blood pressure is mildly elevated we will continue lisinopril 5 mg daily.  Add Lopressor per cardiology's recommendations.   Diet: Carb-Modified IVF: None,None VTE: Enoxaparin Code: Full PT/OT recs: None TOC recs: None   Dispo: Anticipated possible discharge today pending cardiology's recommendations.Marianna Payment, D.O. [facility], PGY-1 Date 02/25/2020 Time 11:04 AM

## 2020-02-28 MED FILL — LISINOPRIL 5 MG TABS: 5 | 30 days supply | Qty: 30 | Fill #2

## 2020-02-28 NOTE — Discharge Summary (Signed)
Name: [name] MRN: [id number] DOB: 10/17/1961 59 y.o. PCP: Katherine Roan, MD  Date of Admission: 02/24/2020  3:55 PM Date of Discharge: 02/25/2020 Attending Physician: Lalla Brothers, MD FACP  Discharge Diagnosis: 1. Chest pain secondary to GERD  Discharge Medications: Allergies as of 02/25/2020      Reactions   Aspirin Nausea And Vomiting   Ibuprofen Nausea And Vomiting, Other (See Comments)   Abdominal pain   Penicillins Other (See Comments)   Unknown Did it involve swelling of the face/tongue/throat, SOB, or low BP? Unk Did it involve sudden or severe rash/hives, skin peeling, or any reaction on the inside of your mouth or nose? Unk  Did you need to seek medical attention at a hospital or doctor's office? Unk When did it last happen?Childhood If all above answers are "NO", may proceed with cephalosporin use.      Medication List    TAKE these medications   atorvastatin 40 MG tablet Commonly known as: LIPITOR Take 1 tablet (40 mg total) by mouth daily.   insulin glargine 100 UNIT/ML Solostar Pen Commonly known as: LANTUS Inject 70 Units into the skin at bedtime.   Basaglar KwikPen 100 UNIT/ML Inject 0.7 mLs (70 Units total) into the skin daily.   blood glucose meter kit and supplies Kit Dispense based on patient and insurance preference. Use up to four times daily as directed. (FOR ICD-9 250.00, 250.01).   Contour Next Test test strip Generic drug: glucose blood Use to check blood sugar 4 times daily. DIAG CODE E 11.40. insulin dependent   Fish Oil 1000 MG Caps Take 1 capsule by mouth every evening.   gabapentin 300 MG capsule Commonly known as: NEURONTIN TAKE 3 CAPSULES BY MOUTH 3 TIMES DAILY.   insulin lispro 100 UNIT/ML KwikPen Commonly known as: HumaLOG KwikPen Inject 0.18 mLs (18 Units total) into the skin 3 (three) times daily with meals.   Lancets Misc 1 each by Does not apply route 4 (four) times daily -  before meals and at  bedtime.   lidocaine 5 % Commonly known as: Lidoderm Place 1 patch onto the skin daily. Remove & Discard patch within 12 hours or as directed by MD What changed:   when to take this  reasons to take this   lisinopril 5 MG tablet Commonly known as: ZESTRIL Take 1 tablet (5 mg total) by mouth daily.   metFORMIN 500 MG 24 hr tablet Commonly known as: Glucophage XR Take 2 tablets (1,000 mg total) by mouth 2 (two) times daily with a meal. What changed:   how much to take  when to take this   multivitamin with minerals Tabs tablet Take 1 tablet by mouth every evening.   omeprazole 20 MG capsule Commonly known as: PRILOSEC TAKE 1 Capsule BY MOUTH ONCE DAILY   PROBIOTIC PO Take 1 capsule by mouth daily.   tiZANidine 4 MG tablet Commonly known as: Zanaflex Take 1 tablet (4 mg total) by mouth at bedtime as needed for muscle spasms.       Disposition and follow-up:   [name] was discharged from [facility] in Good condition.  At the hospital follow up visit please address:  1.  Follow up: 1) GERD - make sure she is taking a consistent course of PPI, 2) HTN - titrated medicine as needed.  2.  Labs / imaging needed at time of follow-up: bmp  3.  Pending labs/ test needing follow-up: none  Follow-up Appointments: 1. [facility] - call to make an appointment.   Hospital Course by problem list: 1. Chest pain - patient presented to [facility] with a 3 days history of right sided chest pain that felt like pressure and burning. The pain did radiate to jaw and right arm. The episodes would last 20-30 minutes. Cardiology was consulted due to typical cardiac chest pain. Delta troponins were negative. GI cocktail improved her pain. Cardiac CTA was performed and was WNL. Patient's chest pain was likely 2//2 to her GERD. She was discharged with follow up to get on consistent course of medication for her GERD.   Discharge Vitals:   BP 120/74    Pulse 72   Temp 98.2 F (36.8 C) (Oral)   Resp 19   Ht 6' (1.829 m)   Wt (!) 137.1 kg   LMP 06/23/2014   SpO2 98%   BMI 40.99 kg/m   Pertinent Labs, Studies, and Procedures:  CBC Latest Ref Rng & Units 02/25/2020 02/24/2020 01/28/2019  WBC 4.0 - 10.5 K/uL 7.7 7.1 6.5  Hemoglobin 12.0 - 15.0 g/dL 12.5 13.0 11.9(L)  Hematocrit 36.0 - 46.0 % 40.7 41.1 37.8  Platelets 150 - 400 K/uL 216 237 216   CMP Latest Ref Rng & Units 02/25/2020 02/24/2020 01/28/2019  Glucose 70 - 99 mg/dL 290(H) 170(H) 307(H)  BUN 6 - 20 mg/dL '8 6 12  ' Creatinine 0.44 - 1.00 mg/dL 0.56 0.49 0.81  Sodium 135 - 145 mmol/L 141 139 136  Potassium 3.5 - 5.1 mmol/L 4.2 4.4 4.2  Chloride 98 - 111 mmol/L 104 105 103  CO2 22 - 32 mmol/L '24 26 23  ' Calcium 8.9 - 10.3 mg/dL 9.1 9.3 8.7(L)  Total Protein 6.5 - 8.1 g/dL - - 6.9  Total Bilirubin 0.3 - 1.2 mg/dL - - 0.6  Alkaline Phos 38 - 126 U/L - - 92  AST 15 - 41 U/L - - 20  ALT 0 - 44 U/L - - 31   Cardiac CTA:  IMPRESSION: 1. Coronary calcium score of 0.  2. Normal coronary origin with right dominance.  3. 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  4. Small PFO.  RECOMMENDATIONS: 1. Minimal non-obstructive CAD (0-24%). Consider non-atherosclerotic causes of chest pain. Consider preventive therapy and risk factor modification.  Discharge Instructions: Discharge Instructions    Diet - low sodium heart healthy   Complete by: As directed    Discharge instructions   Complete by: As directed    You were hospitalized for chest pain. Thank you for allowing [name] to be part of your care.   We arranged for you to follow up at: please make a follow up appointment with your primary care provider within a week or two at [facility].   Please note these changes made to your medications: Please restart your home medications  Please make sure to follow up with your out patient doctors.  Please call our clinic if you have any questions or concerns, we may be  able to help and keep you from a long and expensive emergency room wait. Our clinic and after hours phone number is [phone], the best time to call is Monday through Friday 9 am to 4 pm but there is always someone available 24/7 if you have an emergency. If you need medication refills please notify your pharmacy one week in advance and they will send [name] a request.   Increase activity slowly   Complete by: As directed  Signed: Marianna Payment, MD 02/28/2020, 6:39 AM   Pager: [phone]

## 2020-03-03 ENCOUNTER — Other Ambulatory Visit: Payer: Self-pay | Admitting: Internal Medicine

## 2020-03-03 DIAGNOSIS — Z794 Long term (current) use of insulin: Secondary | ICD-10-CM

## 2020-03-03 DIAGNOSIS — E1142 Type 2 diabetes mellitus with diabetic polyneuropathy: Secondary | ICD-10-CM

## 2020-03-18 ENCOUNTER — Encounter: Payer: Self-pay | Admitting: *Deleted

## 2020-03-30 MED FILL — LISINOPRIL 5 MG TABS: 5 | 30 days supply | Qty: 30 | Fill #3

## 2020-03-30 MED FILL — CONTOUR NEXT STRIPS: 12 days supply | Qty: 50 | Fill #7

## 2020-03-30 MED FILL — GABAPENTIN 300 MG CAPSULE: 300 | 30 days supply | Qty: 270 | Fill #2

## 2020-03-30 MED FILL — METFORMIN HCL ER 500 MG TB2: 500 | 30 days supply | Qty: 120 | Fill #2

## 2020-03-30 MED FILL — LANTUS SOLOSTAR 100 UNITS/M: 100 | 30 days supply | Qty: 21 | Fill #3

## 2020-03-30 MED FILL — ATORVASTATIN 40 MG TABLET: 40 | 30 days supply | Qty: 30 | Fill #0

## 2020-03-30 MED FILL — HUMALOG 100 UNITS/ML KWIKPE: 100 | 27 days supply | Qty: 15 | Fill #3

## 2020-03-31 ENCOUNTER — Other Ambulatory Visit: Payer: Self-pay | Admitting: Internal Medicine

## 2020-03-31 DIAGNOSIS — IMO0002 Reserved for concepts with insufficient information to code with codable children: Secondary | ICD-10-CM

## 2020-03-31 DIAGNOSIS — E1165 Type 2 diabetes mellitus with hyperglycemia: Secondary | ICD-10-CM

## 2020-04-01 ENCOUNTER — Other Ambulatory Visit: Payer: Self-pay | Admitting: Internal Medicine

## 2020-04-01 MED FILL — CONTOUR NEXT STRIPS: 25 days supply | Qty: 100 | Fill #0

## 2020-04-20 ENCOUNTER — Encounter: Payer: Self-pay | Admitting: Internal Medicine

## 2020-04-21 ENCOUNTER — Encounter: Payer: Self-pay | Admitting: Internal Medicine

## 2020-05-04 ENCOUNTER — Encounter: Payer: Self-pay | Admitting: Internal Medicine

## 2020-05-04 ENCOUNTER — Ambulatory Visit (INDEPENDENT_AMBULATORY_CARE_PROVIDER_SITE_OTHER): Payer: Self-pay | Admitting: Internal Medicine

## 2020-05-04 ENCOUNTER — Other Ambulatory Visit: Payer: Self-pay

## 2020-05-04 VITALS — BP 137/61 | HR 86 | Temp 98.2°F | Wt 303.5 lb

## 2020-05-04 DIAGNOSIS — E119 Type 2 diabetes mellitus without complications: Secondary | ICD-10-CM

## 2020-05-04 DIAGNOSIS — Z794 Long term (current) use of insulin: Secondary | ICD-10-CM

## 2020-05-04 DIAGNOSIS — Z1231 Encounter for screening mammogram for malignant neoplasm of breast: Secondary | ICD-10-CM

## 2020-05-04 DIAGNOSIS — K219 Gastro-esophageal reflux disease without esophagitis: Secondary | ICD-10-CM

## 2020-05-04 DIAGNOSIS — Z1211 Encounter for screening for malignant neoplasm of colon: Secondary | ICD-10-CM

## 2020-05-04 MED ORDER — OZEMPIC (0.25 OR 0.5 MG/DOSE) 2 MG/1.5ML ~~LOC~~ SOPN: PEN_INJECTOR | SUBCUTANEOUS | 0 refills | Status: AC

## 2020-05-04 MED ORDER — OMEPRAZOLE 20 MG PO CPDR: 20.0000 mg | DELAYED_RELEASE_CAPSULE | Freq: Every day | ORAL | 1 refills | Status: DC

## 2020-05-04 NOTE — Progress Notes (Signed)
   CC: T2DM  HPI:  [name] is a 59 y.o. female with PMH below.  Today we will address T2DM  Please see A&P for status of the patient's chronic medical conditions  Past Medical History:  Diagnosis Date  . Anemia   . Asthma 11/01/2010  . BPPV (benign paroxysmal positional vertigo) 03/07/2013  . Diabetes mellitus    since 1983  . Elevated TSH 05/13/2013  . GERD (gastroesophageal reflux disease)   . Hypertension   . Left elbow pain 05/05/2016  . Left shoulder pain 05/05/2016  . Osteomyelitis ([place])   . Pneumonia    as a child  . Wrist pain 04/11/2013   Review of Systems:  ROS: Pulmonary: pt denies increased work of breathing, shortness of breath,  Cardiac: pt denies palpitations, chest pain,  Abdominal: pt denies abdominal pain, nausea, vomiting, or diarrhea   Physical Exam:  Vitals:   05/04/20 1555  BP: 137/61  Pulse: 86  Temp: 98.2 F (36.8 C)  TempSrc: Oral  SpO2: 100%  Weight: (!) 303 lb 8 oz (137.7 kg)   Cardiac: JVD flat, normal rate and rhythm, clear s1 and s2, no murmurs, rubs or gallops, no LE edema Pulmonary: CTAB, not in distress Abdominal: non distended abdomen, soft and nontender Psych: Alert, conversant, in good spirits   Social History   Socioeconomic History  . Marital status: Divorced    Spouse name: Not on file  . Number of children: Not on file  . Years of education: Not on file  . Highest education level: Not on file  Occupational History  . Not on file  Tobacco Use  . Smoking status: Former Smoker    Quit date: 11/28/1962    Years since quitting: 57.4  . Smokeless tobacco: Never Used  Substance and Sexual Activity  . Alcohol use: No    Alcohol/week: 0.0 standard drinks  . Drug use: No  . Sexual activity: Not on file  Other Topics Concern  . Not on file  Social History Narrative  . Not on file   Social Determinants of Health   Financial Resource Strain:   . Difficulty of Paying Living Expenses:   Food Insecurity:   .  Worried About Charity fundraiser in the Last Year:   . Arboriculturist in the Last Year:   Transportation Needs:   . Film/video editor (Medical):   Marland Kitchen Lack of Transportation (Non-Medical):   Physical Activity:   . Days of Exercise per Week:   . Minutes of Exercise per Session:   Stress:   . Feeling of Stress :   Social Connections:   . Frequency of Communication with Friends and Family:   . Frequency of Social Gatherings with Friends and Family:   . Attends Religious Services:   . Active Member of Clubs or Organizations:   . Attends Archivist Meetings:   Marland Kitchen Marital Status:   Intimate Partner Violence:   . Fear of Current or Ex-Partner:   . Emotionally Abused:   Marland Kitchen Physically Abused:   . Sexually Abused:     Family History  Problem Relation Age of Onset  . Diabetes Father   . Hypertension Father   . Hypertension Mother   . Dementia Mother   . Diabetes Daughter     Assessment & Plan:   See Encounters Tab for problem based charting.  Patient discussed with Dr. Evette Doffing

## 2020-05-04 NOTE — Patient Instructions (Addendum)
We have started you back on ozempic, please titrate up the dose for weight loss and better blood sugar control.  We have referred you to the scholarship program so you can get a mammogram, pap smear.  I will have you bring back a stool sample that we will test for your colon cancer screening.

## 2020-05-05 ENCOUNTER — Encounter: Payer: Self-pay | Admitting: Internal Medicine

## 2020-05-05 ENCOUNTER — Telehealth: Payer: Self-pay | Admitting: Internal Medicine

## 2020-05-05 DIAGNOSIS — Z1211 Encounter for screening for malignant neoplasm of colon: Secondary | ICD-10-CM

## 2020-05-05 DIAGNOSIS — Z1231 Encounter for screening mammogram for malignant neoplasm of breast: Secondary | ICD-10-CM | POA: Insufficient documentation

## 2020-05-05 HISTORY — DX: Encounter for screening for malignant neoplasm of colon: Z12.11

## 2020-05-05 NOTE — Telephone Encounter (Signed)
Pt would like a call back from the nurse [phone]

## 2020-05-05 NOTE — Assessment & Plan Note (Addendum)
She reports poor recent control due to having a cold.  Taking lantus 70u nightly and humalog 18 TID AC and full dose metformin.  She has let the Med assist program lapse again but has the forms she can use to reapply but has not yet filled them out.  She did not bring her meter today.  Not yet due for A1C.  She is out of ozempic but says she still has plenty of insulin.  Noticed she has gained more weight and had increased appetite off ozempic.  She has been out for several months now.    -restart ozempic titrate up dosage, gave samples for now and encouraged filling out medassist forms. -continue basal bolus insulin as above

## 2020-05-05 NOTE — Assessment & Plan Note (Signed)
Will enroll patient in mammogram scholarship program and the PAP smear scholarship program.

## 2020-05-05 NOTE — Assessment & Plan Note (Signed)
Pt requires refills on medications with associated diagnosis above.  Reviewed disease process and find this medication to be necessary, will not change dose or alter current therapy. 

## 2020-05-05 NOTE — Telephone Encounter (Signed)
Please call pt, her R shoulder and neck , behind ear has gotten worse, she could not get up this am. Could you please call her and discuss possibilities she states she talked to you at her visit 6/7 please call [phone]

## 2020-05-05 NOTE — Telephone Encounter (Signed)
Called pt back and sch ACC 6/9 at [facility]

## 2020-05-05 NOTE — Assessment & Plan Note (Signed)
Explained FIT testing to the patient and possible need for colonoscopy if positive.  She is agreeable.

## 2020-05-05 NOTE — Telephone Encounter (Signed)
Patient mentioned in passing that she "slept wrong" and had some neck discomfort at the end of her visit we did not discuss this during the visit.  Patient will need to make a follow up appointment with either me or acc.

## 2020-05-06 ENCOUNTER — Ambulatory Visit (INDEPENDENT_AMBULATORY_CARE_PROVIDER_SITE_OTHER): Payer: Self-pay | Admitting: Internal Medicine

## 2020-05-06 ENCOUNTER — Telehealth: Payer: Self-pay | Admitting: Internal Medicine

## 2020-05-06 VITALS — BP 144/82 | HR 90 | Temp 98.9°F | Ht 72.0 in | Wt 300.8 lb

## 2020-05-06 DIAGNOSIS — M542 Cervicalgia: Secondary | ICD-10-CM

## 2020-05-06 DIAGNOSIS — IMO0002 Reserved for concepts with insufficient information to code with codable children: Secondary | ICD-10-CM

## 2020-05-06 DIAGNOSIS — E1165 Type 2 diabetes mellitus with hyperglycemia: Secondary | ICD-10-CM

## 2020-05-06 DIAGNOSIS — E118 Type 2 diabetes mellitus with unspecified complications: Secondary | ICD-10-CM

## 2020-05-06 DIAGNOSIS — Z794 Long term (current) use of insulin: Secondary | ICD-10-CM

## 2020-05-06 LAB — POCT GLYCOSYLATED HEMOGLOBIN (HGB A1C): Hemoglobin A1C: 9.4 % — AB (ref 4.0–5.6)

## 2020-05-06 LAB — GLUCOSE, CAPILLARY: Glucose-Capillary: 103 mg/dL — ABNORMAL HIGH (ref 70–99)

## 2020-05-06 MED ORDER — DICLOFENAC SODIUM 1 % EX GEL: 4.0000 g | Freq: Four times a day (QID) | CUTANEOUS | 1 refills | Status: DC

## 2020-05-06 MED ORDER — TIZANIDINE HCL 4 MG PO TABS: 4.0000 mg | ORAL_TABLET | Freq: Every evening | ORAL | 0 refills | Status: DC | PRN

## 2020-05-06 NOTE — Telephone Encounter (Signed)
Pls contact pt regarding medicine, pt upset medicine wasn't put in correctly [phone]  Patient requesting medicine to be fixed

## 2020-05-06 NOTE — Assessment & Plan Note (Addendum)
Patient was seen by Dr. Shan Levans earlier this week, and mention in passing that she was experiencing right neck and shoulder pain.  At that time, it was thought that she had slept on her shoulder wrong which was causing her pain.  At that time, her pain was manageable however it is increased in severity over the past couple days.  Patient states yesterday the pain was at its worse when she woke up.  She describes the pain as sharp and always located in the right side of her neck.  The pain does not radiate.  She denies any numbness or tingling in her arms and fingers.  Denies any fevers.  Patient has tried using icy hot gel, lidocaine patches and as needed Tylenol but has not found relief. The pain is 5/10 in severity and is worsened by movement and relieved by rest.  On exam, the patient has point tenderness in the R trapezius region.  No radiation, numbness or tingling.  Decreased neck ROM due to pain.   Plan: -Flexeril 5 mg nightly -Voltaren gel ordered -Continue as needed IcyHot rub, Tylenol, and lidocaine patches

## 2020-05-06 NOTE — Patient Instructions (Addendum)
Thank you for visiting [name] in clinic today.  Below is a summary of what we discussed:  1.  Neck pain -I think the neck pain you are experiencing is due to muscle irritation from sleeping on that side. -I wrote prescriptions for tizanidine 4 mg once a day and Voltaren gel.  You may pick this up at the pharmacy -You also may take Tylenol as needed  2.  Follow-up -Please let us know if this is not getting better so we can address it.  Feel free to schedule follow-up appointment as needed  If you have any questions or concerns in the meantime, please feel free to reach out to [name].

## 2020-05-06 NOTE — Progress Notes (Signed)
   CC: Neck pain  HPI: [name] is a 59 y.o. with a hx as noted below who presents today for neck pain. Please refer to the problem based charting for details.  Past Medical History:  Diagnosis Date  . Anemia   . Asthma 11/01/2010  . BPPV (benign paroxysmal positional vertigo) 03/07/2013  . Diabetes mellitus    since 1983  . Elevated TSH 05/13/2013  . GERD (gastroesophageal reflux disease)   . Hypertension   . Left elbow pain 05/05/2016  . Left shoulder pain 05/05/2016  . Osteomyelitis ([place])   . Pneumonia    as a child  . Wrist pain 04/11/2013   Review of Systems: Systems reviewed and are otherwise negative unless mentioned in HPI.  Physical Exam:  Vitals:   05/06/20 0909  BP: (!) 144/82  Pulse: 90  Temp: 98.9 F (37.2 C)  SpO2: 98%  Weight: (!) 300 lb 12.8 oz (136.4 kg)  Height: 6' (1.829 m)   Physical Exam Vitals reviewed.  Constitutional:      General: She is not in acute distress.    Appearance: Normal appearance. She is obese. She is not ill-appearing, toxic-appearing or diaphoretic.  HENT:     Head: Normocephalic and atraumatic.  Eyes:     Extraocular Movements: Extraocular movements intact.  Neck:   Pulmonary:     Effort: Pulmonary effort is normal.  Musculoskeletal:     Cervical back: No edema, rigidity, torticollis or crepitus. Pain with movement and muscular tenderness present. No spinous process tenderness. Decreased range of motion.  Neurological:     General: No focal deficit present.     Mental Status: She is alert.     Sensory: No sensory deficit.  Psychiatric:        Mood and Affect: Mood normal.    Assessment & Plan:   See Encounters Tab for problem based charting.  Patient discussed with Dr. Evette Doffing

## 2020-05-06 NOTE — Telephone Encounter (Signed)
I adjusted the prescription. It should be acceptable now. Thanks

## 2020-05-06 NOTE — Telephone Encounter (Signed)
Called pt - stated something was wrong with the Voltaren rx but the pharmacy filled it and will wait until the doctor fix it.  I called [facility] [place] - stated Voltaren comes in 100 g so they gave pt 300 g. Please re-send rx for 300 g. Thanks

## 2020-05-06 NOTE — Progress Notes (Signed)
Internal Medicine Clinic Attending  Case discussed with Dr. Winfrey  at the time of the visit.  We reviewed the resident's history and exam and pertinent patient test results.  I agree with the assessment, diagnosis, and plan of care documented in the resident's note.  

## 2020-05-08 NOTE — Progress Notes (Signed)
Internal Medicine Clinic Attending  Case discussed with Dr. Alexander at the time of the visit.  We reviewed the resident's history and exam and pertinent patient test results.  I agree with the assessment, diagnosis, and plan of care documented in the resident's note.  

## 2020-05-25 ENCOUNTER — Telehealth: Payer: Self-pay

## 2020-05-25 NOTE — Telephone Encounter (Signed)
Please call pt back regarding meds not working.

## 2020-05-25 NOTE — Telephone Encounter (Signed)
Pt calls and states her neck and arm are still causing major problems in her life, appt 6/29 at [place]

## 2020-05-26 ENCOUNTER — Encounter: Payer: Self-pay | Admitting: Internal Medicine

## 2020-05-26 ENCOUNTER — Ambulatory Visit (INDEPENDENT_AMBULATORY_CARE_PROVIDER_SITE_OTHER): Payer: Self-pay | Admitting: Internal Medicine

## 2020-05-26 DIAGNOSIS — M25511 Pain in right shoulder: Secondary | ICD-10-CM

## 2020-05-26 MED ORDER — MELOXICAM 7.5 MG PO TABS: 7.5000 mg | ORAL_TABLET | Freq: Every day | ORAL | 0 refills | Status: AC

## 2020-05-26 MED ORDER — CYCLOBENZAPRINE HCL 5 MG PO TABS: 5.0000 mg | ORAL_TABLET | Freq: Three times a day (TID) | ORAL | 0 refills | Status: DC | PRN

## 2020-05-26 MED ORDER — MELOXICAM 7.5 MG PO TABS: 7.5000 mg | ORAL_TABLET | Freq: Every day | ORAL | 0 refills | Status: DC

## 2020-05-26 MED FILL — MELOXICAM 7.5 MG TABLET: 7.5 | 7 days supply | Qty: 7 | Fill #0

## 2020-05-26 MED FILL — CYCLOBENZAPRINE HCL 5 MG TA: 5 | 14 days supply | Qty: 42 | Fill #0

## 2020-05-26 NOTE — Progress Notes (Signed)
   CC: Right elbow and neck pain  HPI:  [name] is a 59 y.o. with the history listed below presenting for right neck and right elbow pain.   Past Medical History:  Diagnosis Date  . Anemia   . Asthma 11/01/2010  . BPPV (benign paroxysmal positional vertigo) 03/07/2013  . Diabetes mellitus    since 1983  . Elevated TSH 05/13/2013  . GERD (gastroesophageal reflux disease)   . Hypertension   . Left elbow pain 05/05/2016  . Left shoulder pain 05/05/2016  . Osteomyelitis ([place])   . Pneumonia    as a child  . Wrist pain 04/11/2013   Review of Systems:   Constitutional: Negative for chills and fever.  Respiratory: Negative for shortness of breath.   Cardiovascular: Negative for chest pain and leg swelling.  Gastrointestinal: Negative for abdominal pain, nausea and vomiting.  MSK: Positive for right shoulder and neck pain.  Neurological: Negative for dizziness and headaches.   Physical Exam:  Vitals:   05/26/20 1042  BP: 123/70  Pulse: 91  Temp: 97.9 F (36.6 C)  TempSrc: Oral  Weight: 294 lb 6.4 oz (133.5 kg)   Physical Exam Constitutional:      Appearance: Normal appearance.  HENT:     Head: Normocephalic and atraumatic.  Cardiovascular:     Rate and Rhythm: Normal rate and regular rhythm.  Pulmonary:     Effort: Pulmonary effort is normal.     Breath sounds: Normal breath sounds.  Abdominal:     General: Abdomen is flat. Bowel sounds are normal.     Palpations: Abdomen is soft.  Musculoskeletal:        General: Tenderness (TTP over right lateral upper extermity from shoulder to elbow, no elbow or shoulder joint pain) present. No swelling or deformity. Normal range of motion.     Cervical back: Normal range of motion and neck supple. No rigidity or tenderness.  Skin:    General: Skin is warm and dry.     Capillary Refill: Capillary refill takes less than 2 seconds.  Neurological:     General: No focal deficit present.     Mental Status: She is alert and  oriented to person, place, and time.  Psychiatric:        Mood and Affect: Mood normal.        Behavior: Behavior normal.     Assessment & Plan:   See Encounters Tab for problem based charting.  Patient discussed with Dr. Philipp Ovens

## 2020-05-26 NOTE — Patient Instructions (Addendum)
[name],  It was a pleasure to see you today. Thank you for coming in.   Today we discussed your neck and shoulder pain. I am sorry that you are not feeling well. This seems to be consistent with a muscle strain. You exam is reassuring.  Please start taking the flexeril 5 mg three times a day for the next 2 weeks.  Start taking Mobic daily for the next week.  This can sometimes take a few weeks to improve. Please work on stretching the muscles.   Please return to clinic in 2 weeks or sooner if needed.   Thank you again for coming in.   [name] M.D.  Muscle Strain A muscle strain is an injury that occurs when a muscle is stretched beyond its normal length. Usually, a small number of muscle fibers are torn when this happens. There are three types of muscle strains. First-degree strains have the least amount of muscle fiber tearing and the least amount of pain. Second-degree and third-degree strains have more tearing and pain. Usually, recovery from muscle strain takes 1-2 weeks. Complete healing normally takes 5-6 weeks. What are the causes? This condition is caused when a sudden, violent force is placed on a muscle and stretches it too far. This may occur with a fall, lifting, or sports. What increases the risk? This condition is more likely to develop in athletes and people who are physically active. What are the signs or symptoms? Symptoms of this condition include:  Pain.  Bruising.  Swelling.  Trouble using the muscle. How is this diagnosed? This condition is diagnosed based on a physical exam and your medical history. Tests may also be done, including an X-ray, ultrasound, or MRI. How is this treated? This condition is initially treated with PRICE therapy. This therapy involves:  Protecting the muscle from being injured again.  Resting the injured muscle.  Icing the injured muscle.  Applying pressure (compression) to the injured muscle. This may be  done with a splint or elastic bandage.  Raising (elevating) the injured muscle. Your health care provider may also recommend medicine for pain. Follow these instructions at home: If you have a splint:  Wear the splint as told by your health care provider. Remove it only as told by your health care provider.  Loosen the splint if your fingers or toes tingle, become numb, or turn cold and blue.  Keep the splint clean.  If the splint is not waterproof: ? Do not let it get wet. ? Cover it with a watertight covering when you take a bath or a shower. Managing pain, stiffness, and swelling   If directed, put ice on the injured area. ? If you have a removable splint, remove it as told by your health care provider. ? Put ice in a plastic bag. ? Place a towel between your skin and the bag. ? Leave the ice on for 20 minutes, 2-3 times a day.  Move your fingers or toes often to avoid stiffness and to lessen swelling.  Raise (elevate) the injured area above the level of your heart while you are sitting or lying down.  Wear an elastic bandage as told by your health care provider. Make sure that it is not too tight. General instructions  Take over-the-counter and prescription medicines only as told by your health care provider.  Restrict your activity and rest the injured muscle as told by your health care provider. Gentle movements may be allowed.  If physical therapy  was prescribed, do exercises as told by your health care provider.  Do not put pressure on any part of the splint until it is fully hardened. This may take several hours.  Do not use any products that contain nicotine or tobacco, such as cigarettes and e-cigarettes. These can delay bone healing. If you need help quitting, ask your health care provider.  Ask your health care provider when it is safe to drive if you have a splint.  Keep all follow-up visits as told by your health care provider. This is important. How is  this prevented?  Warm up before exercising. This helps to prevent future muscle strains. Contact a health care provider if:  You have more pain or swelling in the injured area. Get help right away if:  You have numbness or tingling or lose a lot of strength in the injured area. Summary  A muscle strain is an injury that occurs when a muscle is stretched beyond its normal length.  This condition is caused when a sudden, violent force is placed on a muscle and stretches it too far.  This condition is initially treated with PRICE therapy, which involves protecting, resting, icing, compressing, and elevating.  Gentle movements may be allowed. If physical therapy was prescribed, do exercises as told by your health care provider. This information is not intended to replace advice given to you by your health care provider. Make sure you discuss any questions you have with your health care provider. Document Revised: 10/27/2017 Document Reviewed: 12/21/2016 Elsevier Patient Education  2020 [employer].

## 2020-05-29 DIAGNOSIS — M25519 Pain in unspecified shoulder: Secondary | ICD-10-CM | POA: Insufficient documentation

## 2020-05-29 NOTE — Assessment & Plan Note (Signed)
Patient was seen on 05/06/2020 for pain in her right neck and shoulder, her pain had been increasing in severity over the past few days and noticed it when she woke up It was described as a sharp pain, located in the right side of her neck, no radiation. No neurological changes.  She did have point tenderness over the right trapezius region and decreased neck ROM secondary to pain.  She was given a dose of Flexeril and Voltaren gel, advised her to continue IcyHot, Tylenol, and lidocaine patches.  Today she reports that she continues to have the right neck and shoulder pain. She reports that the tizanidine did not help much.  She reports that she feels like she has been punched in that area.  It is worse when she is heavy items.  She denies any fevers, chills weakness, sensation changes.  She does state that she took her son's methocarbamol which she states helped however she did not want to take it because she did not see her primary before then.  Reports that the neck pain, denies any issues with limited movement. On exam she has some tenderness to palpation over the lateral aspect her right upper extremity, from the shoulder to the elbow, seems to follow-up the biceps brachii muscle.  No weakness or sensation changes noted.  No neck pain or limited range of motion. Overall this still seems to be consistent with a muscle strain/spasm, no neurological issues noted. She may not have responded well to the tizanidine. We discussed that since she did well with the methocarbamol we can trial her on that for a short course. Can also give a short course of NSAIDs to see if that helps. Advised her that symptoms can persist for a few weeks-months and to continue stretching and using supportive interventions.   -Methocarbanol 5 mg TID -Meloxicam 7.5 mg daily x7 days

## 2020-06-02 NOTE — Progress Notes (Signed)
Internal Medicine Clinic Attending  Case discussed with Dr. Krienke at the time of the visit.  We reviewed the resident's history and exam and pertinent patient test results.  I agree with the assessment, diagnosis, and plan of care documented in the resident's note.    

## 2020-06-03 ENCOUNTER — Ambulatory Visit: Payer: Self-pay

## 2020-06-10 ENCOUNTER — Ambulatory Visit (INDEPENDENT_AMBULATORY_CARE_PROVIDER_SITE_OTHER): Payer: Self-pay | Admitting: Student

## 2020-06-10 ENCOUNTER — Other Ambulatory Visit: Payer: Self-pay | Admitting: Internal Medicine

## 2020-06-10 ENCOUNTER — Other Ambulatory Visit: Payer: Self-pay | Admitting: *Deleted

## 2020-06-10 ENCOUNTER — Encounter: Payer: Self-pay | Admitting: Student

## 2020-06-10 VITALS — BP 127/73 | HR 92 | Temp 98.1°F | Ht 72.0 in | Wt 290.3 lb

## 2020-06-10 DIAGNOSIS — M25511 Pain in right shoulder: Secondary | ICD-10-CM

## 2020-06-10 DIAGNOSIS — Z794 Long term (current) use of insulin: Secondary | ICD-10-CM

## 2020-06-10 DIAGNOSIS — M542 Cervicalgia: Secondary | ICD-10-CM

## 2020-06-10 DIAGNOSIS — E1142 Type 2 diabetes mellitus with diabetic polyneuropathy: Secondary | ICD-10-CM

## 2020-06-10 MED ORDER — INSULIN LISPRO (1 UNIT DIAL) 100 UNIT/ML (KWIKPEN): 18.0000 [IU] | PEN_INJECTOR | Freq: Three times a day (TID) | SUBCUTANEOUS | 3 refills | Status: DC

## 2020-06-10 MED ORDER — MELOXICAM 7.5 MG PO TABS: 7.5000 mg | ORAL_TABLET | Freq: Every day | ORAL | 0 refills | Status: AC

## 2020-06-10 MED ORDER — METHOCARBAMOL 500 MG PO TABS: 1500.0000 mg | ORAL_TABLET | Freq: Four times a day (QID) | ORAL | 0 refills | Status: AC

## 2020-06-10 MED ORDER — METHOCARBAMOL 500 MG PO TABS: 1500.0000 mg | ORAL_TABLET | Freq: Four times a day (QID) | ORAL | 0 refills | Status: DC

## 2020-06-10 MED FILL — HUMALOG 100 UNITS/ML KWIKPE: 100 | 27 days supply | Qty: 15 | Fill #0

## 2020-06-10 MED FILL — ATORVASTATIN 40 MG TABLET: 40 | 30 days supply | Qty: 30 | Fill #2

## 2020-06-10 MED FILL — METHOCARBAMOL 500 MG TABS: 500 | 5 days supply | Qty: 60 | Fill #0

## 2020-06-10 MED FILL — LISINOPRIL 5 MG TABS: 5 | 30 days supply | Qty: 30 | Fill #5

## 2020-06-10 MED FILL — METFORMIN HCL ER 500 MG TB2: 500 | 30 days supply | Qty: 120 | Fill #4

## 2020-06-10 MED FILL — MELOXICAM 7.5 MG TABLET: 7.5 | 7 days supply | Qty: 7 | Fill #0

## 2020-06-10 MED FILL — LANTUS SOLOSTAR 100 UNITS/M: 100 | 30 days supply | Qty: 21 | Fill #5

## 2020-06-10 MED FILL — OMEPRAZOLE DR 20 MG CAPSULE: 20 | 30 days supply | Qty: 30 | Fill #1

## 2020-06-10 NOTE — Assessment & Plan Note (Addendum)
Patient report with worsening shoulder pain.  States the Flexeril helped but she started having side effects from it so she stopped taking.  The pain is now associated with tingling sensations down her right arm to her fingers.  Mild tenderness to the bicep brachii but no neurological findings on exam.  We will start patient on methocarbamol and meloxicam for pain control. Will consider imaging of right shoulder if no response to current treatment regimen.  Plan: -Start methocarbamol 1500 mg 4 times a day for 5 days -Start meloxicam 7.5 mg daily for 7 days. -Recommend MRI of right shoulder if no improvement.  Patient states she is claustrophobic and needed generalized anesthesia for her last MRI.  Anxiolytics did not help calm her down for the MRI.

## 2020-06-10 NOTE — Assessment & Plan Note (Signed)
Patient states her shoulder pains sometimes radiates to her neck when it is worse.  Normal range of motion on exam.  Continue Voltaren gel as needed for pain.

## 2020-06-10 NOTE — Patient Instructions (Addendum)
Thank you, [name] for allowing [name] to provide your care today. Today we discussed your right shoulder arm pain. We still do not know exactly what is causing your pain but we plan to do some imaging to help [name] figure out the cause of your pain. We will send you some medication to help relieve your pain while we wait for the imaging.    I have ordered the following tests: MRI of right shoulder  I have ordered the following medication/changed the following medications:  1. Start methocarbamol 1500 mg four time a day 2. Start Meloxicam 7.5 mg daily for 1 week  Please follow-up in 1 week  Should you have any questions or concerns please call the internal medicine clinic at [phone].    Debra Dibbles, MD, MPH [facility]   My Chart Access: [email or web address]   If you have not already done so, please get your COVID 19 vaccine  To schedule an appointment for a COVID vaccine choice any of the following: Go to [email or web address]   Go to [email or web address]                  Call [phone]                                     Call [phone] and select Option 2

## 2020-06-10 NOTE — Progress Notes (Signed)
   CC: Right shoulder pain  HPI:  [name] is a 59 y.o. with PMH of HTN, HLD, type 2 diabetes with neuropathy who presents to clinic with right shoulder pain. Patient has had pain for over a month.  Pain has not improved with Flexeril and conservative management.  Pain is described as throbbing pain that radiates down to neck and fingers causing tingling sensations.  Patient denies any recent traumas, numbness, headache, or falls.  Past Medical History:  Diagnosis Date  . Anemia   . Asthma 11/01/2010  . BPPV (benign paroxysmal positional vertigo) 03/07/2013  . Diabetes mellitus    since 1983  . Elevated TSH 05/13/2013  . GERD (gastroesophageal reflux disease)   . Hypertension   . Left elbow pain 05/05/2016  . Left shoulder pain 05/05/2016  . Osteomyelitis ([place])   . Pneumonia    as a child  . Wrist pain 04/11/2013   Review of Systems:  All ROS negative otherwise as stated in the HPI  Physical Exam:  General: Pleasant middle-aged woman.  No acute distress. Well nourished, well developed. Head: Normocephalic, atraumatic w/o tenderness Eyes: PERRLA. Sclera is non-icteric Neck: Supple without adenopathy. Thyroid gland midline without masees Cardiac: RRR. No murmurs, rubs or gallops. S1, S2. No lower extremities edema Respiratory: Lungs CTAB. No wheezing or crackles. No increased WOB Abdominal: Soft, symmetric and non tender. No organomegaly. Normal bowel sounds Skin: Warm, dry and intact without rashes or lesions Extremities: Atraumatic. Full ROM. Mild ttp of the bicep brachii. Normal external and internal rotations, abduction and adduction of the right shoulder. Normal sensations. 5/5 strength bilateral upper extremities. Pulse palpable.  Normal range of motion of the neck Neuro: A&O x 3. Moves all extremities Psych: Appropriate mood and affect  Vitals:   06/10/20 1047  BP: 127/73  Pulse: 92  Temp: 98.1 F (36.7 C)  TempSrc: Oral  SpO2: 100%  Weight: 290 lb 4.8 oz (131.7  kg)  Height: 6' (1.829 m)    Assessment & Plan:   See Encounters Tab for problem based charting.  Patient seen with Dr. Emelia Salisbury, MD, MPH

## 2020-06-15 NOTE — Progress Notes (Signed)
Internal Medicine Clinic Attending  I saw and evaluated the patient.  I personally confirmed the key portions of the history and exam documented by Dr. Amponsah and I reviewed pertinent patient test results.  The assessment, diagnosis, and plan were formulated together and I agree with the documentation in the resident's note.  

## 2020-06-19 ENCOUNTER — Encounter: Payer: Self-pay | Admitting: Internal Medicine

## 2020-06-19 ENCOUNTER — Other Ambulatory Visit: Payer: Self-pay

## 2020-06-19 ENCOUNTER — Ambulatory Visit (INDEPENDENT_AMBULATORY_CARE_PROVIDER_SITE_OTHER): Payer: Self-pay | Admitting: Internal Medicine

## 2020-06-19 VITALS — BP 139/78 | HR 94 | Temp 98.3°F | Ht 72.0 in | Wt 295.9 lb

## 2020-06-19 DIAGNOSIS — E1142 Type 2 diabetes mellitus with diabetic polyneuropathy: Secondary | ICD-10-CM

## 2020-06-19 DIAGNOSIS — Z794 Long term (current) use of insulin: Secondary | ICD-10-CM

## 2020-06-19 DIAGNOSIS — M25511 Pain in right shoulder: Secondary | ICD-10-CM

## 2020-06-19 MED ORDER — GABAPENTIN 300 MG PO CAPS: ORAL_CAPSULE | ORAL | 2 refills | Status: DC

## 2020-06-19 MED ORDER — PREDNISONE 20 MG PO TABS: 40.0000 mg | ORAL_TABLET | Freq: Every day | ORAL | 0 refills | Status: AC

## 2020-06-19 MED ORDER — PREDNISONE 20 MG PO TABS: 40.0000 mg | ORAL_TABLET | Freq: Every day | ORAL | 0 refills | Status: DC

## 2020-06-19 MED FILL — GABAPENTIN 300 MG CAPSULE: 300 | 30 days supply | Qty: 270 | Fill #0

## 2020-06-19 MED FILL — predniSONE 20 MG TABS: 20 | 7 days supply | Qty: 14 | Fill #0

## 2020-06-19 NOTE — Patient Instructions (Signed)
Thank you, [name] for allowing [name] to provide your care today. Today we discussed Shoulder Pain.    I have ordered the following labs for you:  Lab Orders  No laboratory test(s) ordered today     I will call if any are abnormal. All of your labs can be accessed through "My Chart".   I have ordered the following medication/changed the following medications:  1. begin Prednisone 40 mg daily for 7 days  Please follow-up in in 10 days.  Should you have any questions or concerns please call the internal medicine clinic at [phone].    Marianna Payment, D.O. [facility]   My Chart Access: [email or web address]   If you have not already done so, please get your COVID 19 vaccine  To schedule an appointment for a COVID vaccine choice any of the following: Go to [email or web address]   Go to [email or web address]                  Call [phone]                                     Call [phone] and select Option 2

## 2020-06-19 NOTE — Progress Notes (Signed)
CC: Right shoulder pain  HPI:  [name] is a 59 y.o. female with a past medical history stated below and presents today for shoulder pain. Please see problem based assessment and plan for additional details.  Shoulder pain  Past Medical History:  Diagnosis Date   Anemia    Asthma 11/01/2010   BPPV (benign paroxysmal positional vertigo) 03/07/2013   Diabetes mellitus    since 1983   Elevated TSH 05/13/2013   GERD (gastroesophageal reflux disease)    Hypertension    Left elbow pain 05/05/2016   Left shoulder pain 05/05/2016   Osteomyelitis ([place])    Pneumonia    as a child   Wrist pain 04/11/2013    Current Outpatient Medications on File Prior to Visit  Medication Sig Dispense Refill   atorvastatin (LIPITOR) 40 MG tablet TAKE 1 TABLET BY MOUTH DAILY. 30 tablet 2   blood glucose meter kit and supplies KIT Dispense based on patient and insurance preference. Use up to four times daily as directed. (FOR ICD-9 250.00, 250.01). 1 each 0   cyclobenzaprine (FLEXERIL) 5 MG tablet Take 1 tablet (5 mg total) by mouth 3 (three) times daily as needed for muscle spasms. 42 tablet 0   diclofenac Sodium (VOLTAREN) 1 % GEL Apply 4 g topically 4 (four) times daily. 300 g 1   glucose blood (CONTOUR NEXT TEST) test strip USE TO CHECK BLOOD SUGAR 4 TIMES DAILY. 400 strip 1   Insulin Glargine (BASAGLAR KWIKPEN) 100 UNIT/ML SOPN Inject 0.7 mLs (70 Units total) into the skin daily. (Patient not taking: Reported on 02/24/2020) 81 pen 3   insulin glargine (LANTUS) 100 UNIT/ML Solostar Pen Inject 70 Units into the skin at bedtime.     insulin lispro (HUMALOG KWIKPEN) 100 UNIT/ML KwikPen Inject 0.18 mLs (18 Units total) into the skin 3 (three) times daily with meals. 7 pen 3   Lancets MISC 1 each by Does not apply route 4 (four) times daily -  before meals and at bedtime. 100 each 11   lidocaine (LIDODERM) 5 % Place 1 patch onto the skin daily. Remove & Discard patch within 12 hours or  as directed by MD (Patient taking differently: Place 1 patch onto the skin daily as needed (pain). Remove & Discard patch within 12 hours or as directed by MD) 30 patch 0   lisinopril (ZESTRIL) 5 MG tablet Take 1 tablet (5 mg total) by mouth daily. 90 tablet 3   metFORMIN (GLUCOPHAGE XR) 500 MG 24 hr tablet Take 2 tablets (1,000 mg total) by mouth 2 (two) times daily with a meal. (Patient taking differently: Take 2,000 mg by mouth every evening. ) 360 tablet 3   Multiple Vitamin (MULTIVITAMIN WITH MINERALS) TABS tablet Take 1 tablet by mouth every evening.      Omega-3 Fatty Acids (FISH OIL) 1000 MG CAPS Take 1 capsule by mouth every evening.     omeprazole (PRILOSEC) 20 MG capsule Take 1 capsule (20 mg total) by mouth daily. 30 capsule 1   Probiotic Product (PROBIOTIC PO) Take 1 capsule by mouth daily.     Semaglutide,0.25 or 0.5MG/DOS, (OZEMPIC, 0.25 OR 0.5 MG/DOSE,) 2 MG/1.5ML SOPN Inject 0.1875 mLs (0.25 mg total) into the skin once a week for 28 days, THEN 0.375 mLs (0.5 mg total) once a week for 28 days, THEN 0.75 mLs (1 mg total) once a week for 28 days. 2 pen 0   tiZANidine (ZANAFLEX) 4 MG tablet Take 1 tablet (4 mg total) by  mouth at bedtime as needed for muscle spasms. 14 tablet 0   No current facility-administered medications on file prior to visit.    Family History  Problem Relation Age of Onset   Diabetes Father    Hypertension Father    Hypertension Mother    Dementia Mother    Diabetes Daughter     Social History   Socioeconomic History   Marital status: Divorced    Spouse name: Not on file   Number of children: Not on file   Years of education: Not on file   Highest education level: Not on file  Occupational History   Not on file  Tobacco Use   Smoking status: Former Smoker    Quit date: 11/28/1962    Years since quitting: 73.6   Smokeless tobacco: Never Used  Vaping Use   Vaping Use: Never used  Substance and Sexual Activity   Alcohol use:  No    Alcohol/week: 0.0 standard drinks   Drug use: No   Sexual activity: Not on file  Other Topics Concern   Not on file  Social History Narrative   Not on file   Social Determinants of Health   Financial Resource Strain:    Difficulty of Paying Living Expenses:   Food Insecurity:    Worried About Charity fundraiser in the Last Year:    Arboriculturist in the Last Year:   Transportation Needs:    Film/video editor (Medical):    Lack of Transportation (Non-Medical):   Physical Activity:    Days of Exercise per Week:    Minutes of Exercise per Session:   Stress:    Feeling of Stress :   Social Connections:    Frequency of Communication with Friends and Family:    Frequency of Social Gatherings with Friends and Family:    Attends Religious Services:    Active Member of Clubs or Organizations:    Attends Music therapist:    Marital Status:   Intimate Partner Violence:    Fear of Current or Ex-Partner:    Emotionally Abused:    Physically Abused:    Sexually Abused:     Review of Systems: ROS negative except for what is noted on the assessment and plan.  Vitals:   06/19/20 1336  BP: (!) 139/78  Pulse: 94  Temp: 98.3 F (36.8 C)  TempSrc: Oral  SpO2: 100%  Weight: (!) 295 lb 14.4 oz (134.2 kg)  Height: 6' (1.829 m)     Physical Exam: Physical Exam Neck:     Comments: Patient has full active and passive range of motion of her neck without tenderness to palpation.  Compression test of her neck in all ranges of motion does not elicit radicular symptoms.  I do not palpate any hypertonicity of sternocleidomastoid or scalenes muscles.  Nor could I appreciate a for cervical rib on the right. Cardiovascular:     Pulses: Normal pulses.     Heart sounds: Normal heart sounds.     Comments: Patient had 2+ pulses in bilateral upper extremities. Pulmonary:     Effort: Pulmonary effort is normal.     Breath sounds: Normal breath  sounds.  Musculoskeletal:     Right shoulder: Tenderness (TTP over the [facility] joint) present. No swelling (no erythema, rubor, rash,), deformity, effusion or crepitus. Normal range of motion. Normal strength. Normal pulse.     Left shoulder: Normal.     Cervical back: Normal range of  motion. No tenderness (no TTP.).     Comments: Hawking's, apprehension, empty cans were within normal limits.  Roos test reproduced pain  Skin:    General: Skin is warm and dry.      Assessment & Plan:   See Encounters Tab for problem based charting.  Patient discussed with Dr. Hilbert Odor, D.O. [facility], PGY-2 Pager: [phone], Phone: [phone] Date 06/20/2020 Time 8:09 AM

## 2020-06-20 ENCOUNTER — Encounter: Payer: Self-pay | Admitting: Internal Medicine

## 2020-06-20 NOTE — Assessment & Plan Note (Addendum)
Patient presents for reevaluation of her right shoulder pain.  She states that the pain started in June and initially saw Dr. Shan Levans at [facility] internal medicine.  She states that she believes she was initially sleeping on it wrong and felt pain in her right shoulder that radiated up to her right neck.  During this time the pain has progressed all the way down her arm and she describes it as numbness and tingling with burning pain.  She states that she is able to have full range of motion and strength but the pain occurs shortly after movement.  She denies any precipitating factors including trauma, infection, repetitive activities (e.g. lifting objects overhead).  She has tried a short course of nonsteroidal anti-inflammatory medications and combinations with multiple different types of muscle relaxers without success.  She states that she is taking gabapentin currently and is prescribed gabapentin 300 mg 3 times daily despite expressing that she is actually taking 900 mg 3 times daily.  I cannot find any documentation to support this increased dosage.  She states that the gabapentin often makes her feel sleepy and therefore she does not take it throughout the day, rather she takes it at night before she goes to bed and therefore does not notice a difference in her pain.  Patient states that the pain does not occur with activity rather right after activity.  She states the pain is 12/10 during these times. The pain seems to radiate down the radial nerve distribution of her right arm particularly on the extensor side of her hand on the thumb and second digit.  On exam the patient has full active and passive range of motion in his neurovascularly intact with 3+ pulses in her upper extremities bilaterally.  No change in sensation.  Patient has 5 out of 5 strength in flexion extension, interna, and external rotation.  Reflexes 2+ bilaterally in upper extremities.  Hawkins test, apprehension, empty cans are all  within normal limits and negative for impingement.  Cervical compression test in forward, left and right flexion and extension of the neck does not reproduce her pain.  Roos test did reproduce her symptoms but this test has high false positive rate.  The skin was clean and dry without erythema, rubor edema, or joint effusions.  She did have a tenderness to palpation over the acromioclavicular joint but this did not reproduce her symptoms exactly.  She does not have any hypertonicity of her supraspinatus or subscapularis muscle.  No tremor was noted on exam.  I have a low suspicion for shoulder impingement syndrome or significant osteoarthritis.  I am concerned the patient may be suffering from thoracic outlet syndrome from possible impingement of the brachial plexus.  She has failed management with conservative therapies, therefore I will prescribe her a short course of prednisone to see if this improves her symptoms.    Differential diagnosis includes diabetic plexopathy.  If patient's symptoms do not improve she will likely need further evaluation with MRI and possible EMG to delineate further nerve involvement.    The focality of her right shoulder pain makes systemic issues such as thyroid myopathy, B12 insufficiency, syphilis, and Lyme's disease less likely.  Particularly as she is not having any other systemic findings.  Patient did have a recent chest x-ray which did not show any masses in the apices of her right lung and she did not have any signs or symptoms concerning for Horner syndrome on the right side.  Asked patient to return  in 7 to 10 days to reevaluate her symptoms after 7-day course of prednisone.  Importantly, patient does have a history of diabetes that is poorly controlled with a hemoglobin A1c of 9.4.  She is currently taking semaglutide (titrating dose currently), metformin 1000 mg twice daily, and Humalog 18 units 3 times daily with meals.  I instructed the patient to check her blood  sugar 3 times daily while on steroid therapy and to call the clinic if her blood glucose is above 200 consistently as we wil likely need to increase her mealtime insulin due to increased postprandial hyperglycemia in the setting of steroids.  I counseled the patient on signs and symptoms to look out for for worsening hypoglycemic episodes.  Patient admits understanding.   Plan: -Prednisone 40 mg p.o. x7 days -Reevaluate in 7 to 10 days -Check blood glucose 3 times daily while on oral steroids. -Provided strict return precautions.  Particularly regarding hypoglycemia. - Refilled Gabapentin 300 mg TID and instructed patient to take this appropriately.

## 2020-06-22 NOTE — Progress Notes (Signed)
Internal Medicine Clinic Attending  Case discussed with Dr. Coe  At the time of the visit.  We reviewed the resident's history and exam and pertinent patient test results.  I agree with the assessment, diagnosis, and plan of care documented in the resident's note.  

## 2020-06-29 ENCOUNTER — Ambulatory Visit: Payer: Self-pay

## 2020-06-29 ENCOUNTER — Encounter: Payer: Self-pay | Admitting: Internal Medicine

## 2020-07-03 ENCOUNTER — Encounter: Payer: Self-pay | Admitting: Internal Medicine

## 2020-07-10 ENCOUNTER — Telehealth: Payer: Self-pay

## 2020-07-10 ENCOUNTER — Encounter: Payer: Self-pay | Admitting: Internal Medicine

## 2020-07-10 ENCOUNTER — Ambulatory Visit (INDEPENDENT_AMBULATORY_CARE_PROVIDER_SITE_OTHER): Payer: Self-pay | Admitting: Internal Medicine

## 2020-07-10 ENCOUNTER — Other Ambulatory Visit: Payer: Self-pay | Admitting: Internal Medicine

## 2020-07-10 ENCOUNTER — Other Ambulatory Visit: Payer: Self-pay

## 2020-07-10 VITALS — BP 150/82 | HR 99 | Temp 98.6°F | Ht 73.0 in | Wt 288.4 lb

## 2020-07-10 DIAGNOSIS — E1142 Type 2 diabetes mellitus with diabetic polyneuropathy: Secondary | ICD-10-CM

## 2020-07-10 DIAGNOSIS — M542 Cervicalgia: Secondary | ICD-10-CM

## 2020-07-10 DIAGNOSIS — Z794 Long term (current) use of insulin: Secondary | ICD-10-CM

## 2020-07-10 DIAGNOSIS — G540 Brachial plexus disorders: Secondary | ICD-10-CM

## 2020-07-10 DIAGNOSIS — M25511 Pain in right shoulder: Secondary | ICD-10-CM

## 2020-07-10 MED ORDER — ATORVASTATIN CALCIUM 40 MG PO TABS: 40.0000 mg | ORAL_TABLET | Freq: Every day | ORAL | 2 refills | Status: DC

## 2020-07-10 MED ORDER — DICLOFENAC SODIUM 75 MG PO TBEC: 75.0000 mg | DELAYED_RELEASE_TABLET | Freq: Two times a day (BID) | ORAL | 0 refills | Status: AC

## 2020-07-10 MED ORDER — METHOCARBAMOL 750 MG PO TABS: ORAL_TABLET | ORAL | 0 refills | Status: DC

## 2020-07-10 MED ORDER — METHOCARBAMOL 750 MG PO TABS: ORAL_TABLET | ORAL | 0 refills | Status: AC

## 2020-07-10 MED ORDER — DICLOFENAC SODIUM 75 MG PO TBEC: 75.0000 mg | DELAYED_RELEASE_TABLET | Freq: Two times a day (BID) | ORAL | 0 refills | Status: DC

## 2020-07-10 MED FILL — HUMALOG 100 UNITS/ML KWIKPE: 100 | 27 days supply | Qty: 15 | Fill #1

## 2020-07-10 MED FILL — LISINOPRIL 5 MG TABS: 5 | 30 days supply | Qty: 30 | Fill #6

## 2020-07-10 MED FILL — METFORMIN HCL ER 500 MG TB2: 500 | 30 days supply | Qty: 120 | Fill #5

## 2020-07-10 MED FILL — DICLOFENAC SOD EC 75 MG TAB: 75 | 7 days supply | Qty: 14 | Fill #0

## 2020-07-10 MED FILL — LANTUS SOLOSTAR 100 UNITS/M: 100 | 30 days supply | Qty: 21 | Fill #6

## 2020-07-10 MED FILL — METHOCARBAMOL 750 MG TABS: 750 | 7 days supply | Qty: 46 | Fill #0

## 2020-07-10 MED FILL — ATORVASTATIN 40 MG TABLET: 40 | 30 days supply | Qty: 30 | Fill #0

## 2020-07-10 MED FILL — METHOCARBAMOL 750 MG TABS: 750 | 2 days supply | Qty: 14 | Fill #0

## 2020-07-10 NOTE — Telephone Encounter (Signed)
Received TC from patient who states she picked up her RX for Robaxin and only received #14 pills.  Patient asking RN why?  RN informed patient that MD sent RX with a quantity of # 46 and she would need to call the [facility] outpatient pharmacy.  Patient states she did not get the RX on IM program.  Pt informed Dr. Court Joy called [facility] and requested IM program per his note.  Pt got angry and started screaming at nurse that she was fed up with this and sick of this.  RN reiterated to patient to call [facility] OP to inquire about quantity and IM program, she verbalized understanding. SChaplin, RN,BSN

## 2020-07-10 NOTE — Progress Notes (Signed)
° °  CC: Right shoulder pain   HPI:[name] is a 59 y.o. female who presents for evaluation of right shoulder pain. Please see individual problem based A/P for details.   Past Medical History:  Diagnosis Date   Anemia    Asthma 11/01/2010   BPPV (benign paroxysmal positional vertigo) 03/07/2013   Diabetes mellitus    since 1983   Elevated TSH 05/13/2013   GERD (gastroesophageal reflux disease)    Hypertension    Left elbow pain 05/05/2016   Left shoulder pain 05/05/2016   Osteomyelitis ([place])    Pneumonia    as a child   Wrist pain 04/11/2013   Review of Systems:  ROS negative except as mentioned in individual problem based A/P.  Physical Exam: Vitals:   07/10/20 0926  BP: (!) 150/82  Pulse: 99  Temp: 98.6 F (37 C)  TempSrc: Oral  SpO2: 98%  Weight: 288 lb 6.4 oz (130.8 kg)  Height: 6\' 1"  (1.854 m)   Gen:NAD, obese  CV: RRR, no murmurs Pulm: CTAB, no wheezes or rales Shoulder: No obvious deformity or asymmetry. No bruising. No swelling No TTP Full ROM in flexion, abduction, internal/external rotation NV intact distally Special Tests:  - Impingement: Neg Hawkins and Neers.  - Supraspinatus: pain in right shoulder with empty can.  5/5 strength - Infraspinatus/Teres: 5/5 strength with ER, pain in right shoulder  - Subscapularis: negative belly press,  5/5 strength with IR - Biceps tendon: Negative Speeds.  - Labrum: Negative Obriens.  - AC Joint: Negative cross arm - Negative apprehension test Pain over palpation of thoracic outlet  Assessment & Plan:   See Encounters Tab for problem based charting.  Patient seen with Dr. Angelia Mould

## 2020-07-10 NOTE — Telephone Encounter (Signed)
Question about methocarbamol (ROBAXIN-750) 750 MG tablet. Please call pt back.

## 2020-07-10 NOTE — Telephone Encounter (Signed)
I spoke to the pharmacy through secure chat and the change was made.

## 2020-07-10 NOTE — Telephone Encounter (Signed)
Pt at pharmacy and states all RX's were to be called in on IM program.  Will forward to Dr. Court Joy (pt was seen this morning.). Thanks, SChaplin, RN,BSN

## 2020-07-10 NOTE — Patient Instructions (Signed)
Thank you for trusting me with your care. To recap, today we discussed the following:  Thoracic outlet syndrome - methocarbamol (ROBAXIN-750) 750 MG tablet; Take 2 tablets (1,500 mg total) by mouth 4 (four) times daily for 2 days, THEN 2 tablets (1,500 mg total) every 8 (eight) hours as needed for up to 5 days for muscle spasms.  Dispense: 14 tablet; Refill: 0 - diclofenac (VOLTAREN) 75 MG EC tablet; Take 1 tablet (75 mg total) by mouth 2 (two) times daily for 7 days.  Dispense: 14 tablet; Refill: 0 - Ambulatory referral to Physical Therapy - Printout of exercises  Hyperlipidemia  - atorvastatin (LIPITOR) 40 MG tablet; Take 1 tablet (40 mg total) by mouth daily.  Dispense: 90 tablet; Refill: 2

## 2020-07-12 ENCOUNTER — Other Ambulatory Visit: Payer: Self-pay | Admitting: Internal Medicine

## 2020-07-12 ENCOUNTER — Encounter: Payer: Self-pay | Admitting: Internal Medicine

## 2020-07-12 DIAGNOSIS — G540 Brachial plexus disorders: Secondary | ICD-10-CM | POA: Insufficient documentation

## 2020-07-12 MED ORDER — GABAPENTIN 300 MG PO CAPS: ORAL_CAPSULE | ORAL | 6 refills | Status: DC

## 2020-07-12 MED ORDER — DICLOFENAC SODIUM 1 % EX GEL: 4.0000 g | Freq: Four times a day (QID) | CUTANEOUS | 1 refills | Status: DC

## 2020-07-12 NOTE — Assessment & Plan Note (Signed)
  Thoracic outlet syndrome Patient presents today for evaluation of her neck and right shoulder pain. Has pain with palpation of thoracic outlet and has radiation of pain down her right shoulder. Distal pulse and sensation in tack. She has relief with prednisone, muscle relaxer and NSAID temporarily. Has not worked with PT. She reports sleeping on 2 soft pillows at night. Plan:  - Discussed sleeping on firm pillow for stabilization of neck and given exercises to start - Ambulatory referral to Physical Therapy - methocarbamol (ROBAXIN-750) 750 MG tablet; Take 2 tablets (1,500 mg total) by mouth 4 (four) times daily for 2 days, THEN 2 tablets (1,500 mg total) every 8 (eight) hours as needed for up to 5 days for muscle spasms.  Dispense: 46 tablet; Refill: 0 - diclofenac (VOLTAREN) 75 MG EC tablet; Take 1 tablet (75 mg total) by mouth 2 (two) times daily for 7 days.  Dispense: 14 tablet; Refill: 0

## 2020-07-12 NOTE — Telephone Encounter (Signed)
Prescriptions should be correct now. Patient was updated.

## 2020-07-12 NOTE — Assessment & Plan Note (Signed)
-   Patient given Ozempic sample today.

## 2020-07-14 ENCOUNTER — Encounter: Payer: Self-pay | Admitting: Internal Medicine

## 2020-07-14 NOTE — Progress Notes (Signed)
Internal Medicine Clinic Attending  I saw and evaluated the patient.  I personally confirmed the key portions of the history and exam documented by Dr. Court Joy and I reviewed pertinent patient test results.  The assessment, diagnosis, and plan were formulated together and I agree with the documentation in the resident's note. Palpable spasm and tenderness of scalenes on right side c/w thoracic outlet syndrome.  Agree with muscle relaxants and NSAIDS, provided exercises

## 2020-07-15 ENCOUNTER — Ambulatory Visit: Payer: Self-pay

## 2020-07-15 ENCOUNTER — Telehealth: Payer: Self-pay

## 2020-07-15 NOTE — Telephone Encounter (Signed)
Spoke with patient today, informed her scat form is completed and ready for pick up.

## 2020-07-21 ENCOUNTER — Encounter: Payer: Self-pay | Admitting: *Deleted

## 2020-08-12 ENCOUNTER — Ambulatory Visit: Payer: Self-pay

## 2020-08-13 NOTE — Addendum Note (Signed)
Addended by: Hulan Fray on: 08/13/2020 07:22 AM   Modules accepted: Orders

## 2020-08-21 MED FILL — LISINOPRIL 5 MG TABS: 5 | 30 days supply | Qty: 30 | Fill #7

## 2020-08-21 MED FILL — LANTUS SOLOSTAR 100 UNITS/M: 100 | 30 days supply | Qty: 21 | Fill #7

## 2020-08-21 MED FILL — HUMALOG 100 UNITS/ML KWIKPE: 100 | 27 days supply | Qty: 15 | Fill #2

## 2020-09-17 MED FILL — CONTOUR NEXT STRIPS: 25 days supply | Qty: 100 | Fill #1

## 2020-10-02 MED FILL — METFORMIN HCL ER 500 MG TB2: 500 | 30 days supply | Qty: 120 | Fill #6

## 2020-10-02 MED FILL — LISINOPRIL 5 MG TABS: 5 | 30 days supply | Qty: 30 | Fill #8

## 2020-10-02 MED FILL — GABAPENTIN 300 MG CAPSULE: 300 | 30 days supply | Qty: 270 | Fill #0

## 2020-10-02 MED FILL — CONTOUR NEXT STRIPS: 25 days supply | Qty: 100 | Fill #1

## 2020-10-02 MED FILL — ATORVASTATIN 40 MG TABLET: 40 | 90 days supply | Qty: 90 | Fill #0

## 2020-10-08 MED FILL — HUMALOG 100 UNITS/ML KWIKPE: 100 | 27 days supply | Qty: 15 | Fill #3

## 2020-10-08 MED FILL — LANTUS SOLOSTAR 100 UNITS/M: 100 | 30 days supply | Qty: 21 | Fill #8

## 2020-10-29 ENCOUNTER — Other Ambulatory Visit: Payer: Self-pay | Admitting: Student

## 2020-10-29 ENCOUNTER — Telehealth: Payer: Self-pay | Admitting: *Deleted

## 2020-10-29 MED ORDER — INSULIN GLARGINE 100 UNIT/ML SOLOSTAR PEN: 70.0000 [IU] | PEN_INJECTOR | Freq: Every day | SUBCUTANEOUS | 2 refills | Status: DC

## 2020-10-29 MED FILL — HUMALOG 100 UNITS/ML KWIKPE: 100 | 27 days supply | Qty: 15 | Fill #4

## 2020-10-29 MED FILL — GABAPENTIN 300 MG CAPSULE: 300 | 30 days supply | Qty: 270 | Fill #1

## 2020-10-29 MED FILL — ATORVASTATIN 40 MG TABLET: 40 | 30 days supply | Qty: 30 | Fill #1

## 2020-10-29 MED FILL — CONTOUR NEXT STRIPS: 25 days supply | Qty: 100 | Fill #2

## 2020-10-29 MED FILL — LISINOPRIL 5 MG TABS: 5 | 30 days supply | Qty: 30 | Fill #9

## 2020-10-29 MED FILL — METFORMIN HCL ER 500 MG TB2: 500 | 30 days supply | Qty: 120 | Fill #7

## 2020-10-29 NOTE — Telephone Encounter (Addendum)
Received call from Audrea Muscat at [facility]. States patient's insurance covers Lantus solostar pen but not basaglar. Requesting new Rx for Lantus with [facility] Program in Comments. Hubbard Hartshorn, BSN, RN-BC

## 2020-10-29 NOTE — Telephone Encounter (Signed)
I have refilled the patient's lantus solostar pen 70u daily through IM program. Thanks so much!

## 2020-10-29 NOTE — Telephone Encounter (Signed)
I do not see any mention from our clinic notes or med records that we have prescribed this patient basaglar? I see that she has been prescribed Lantus solostar pen 70u daily in the past, however that prescription does not appear to be coming from our clinic.

## 2020-10-30 MED FILL — LANTUS SOLOSTAR 100 UNITS/M: 100 | 30 days supply | Qty: 21 | Fill #0

## 2020-11-25 MED FILL — GABAPENTIN 300 MG CAPSULE: 300 | 30 days supply | Qty: 270 | Fill #2

## 2020-11-25 MED FILL — HUMALOG 100 UNITS/ML KWIKPE: 100 | 16 days supply | Qty: 9 | Fill #5

## 2020-11-25 MED FILL — LANTUS SOLOSTAR 100 UNITS/M: 100 | 30 days supply | Qty: 21 | Fill #1

## 2020-11-25 MED FILL — METFORMIN HCL ER 500 MG TB2: 500 | 30 days supply | Qty: 120 | Fill #8

## 2020-11-25 MED FILL — LISINOPRIL 5 MG TABS: 5 | 30 days supply | Qty: 30 | Fill #10

## 2020-11-25 MED FILL — CONTOUR NEXT STRIPS: 25 days supply | Qty: 100 | Fill #3

## 2020-11-25 MED FILL — ATORVASTATIN 40 MG TABLET: 40 | 30 days supply | Qty: 30 | Fill #2

## 2020-12-29 ENCOUNTER — Other Ambulatory Visit: Payer: Self-pay | Admitting: Internal Medicine

## 2020-12-29 ENCOUNTER — Other Ambulatory Visit: Payer: Self-pay | Admitting: Student

## 2020-12-29 ENCOUNTER — Other Ambulatory Visit: Payer: Self-pay | Admitting: Student in an Organized Health Care Education/Training Program

## 2020-12-29 DIAGNOSIS — E1142 Type 2 diabetes mellitus with diabetic polyneuropathy: Secondary | ICD-10-CM

## 2020-12-29 DIAGNOSIS — Z794 Long term (current) use of insulin: Secondary | ICD-10-CM

## 2020-12-29 MED FILL — LANTUS SOLOSTAR 100 UNITS/M: 100 | 4 days supply | Qty: 3 | Fill #2

## 2020-12-29 MED FILL — CONTOUR NEXT STRIPS: 25 days supply | Qty: 100 | Fill #4

## 2020-12-29 MED FILL — ATORVASTATIN 40 MG TABLET: 40 | 30 days supply | Qty: 30 | Fill #3

## 2020-12-29 MED FILL — GABAPENTIN 300 MG CAPSULE: 300 | 30 days supply | Qty: 270 | Fill #3

## 2021-01-01 ENCOUNTER — Other Ambulatory Visit: Payer: Self-pay | Admitting: Student

## 2021-01-01 DIAGNOSIS — Z794 Long term (current) use of insulin: Secondary | ICD-10-CM

## 2021-01-01 DIAGNOSIS — E1142 Type 2 diabetes mellitus with diabetic polyneuropathy: Secondary | ICD-10-CM

## 2021-01-01 MED ORDER — METFORMIN HCL ER 500 MG PO TB24
1000.0000 mg | ORAL_TABLET | Freq: Two times a day (BID) | ORAL | 3 refills | Status: DC
Start: 2021-01-01 — End: 2021-01-01

## 2021-01-01 MED ORDER — LANTUS SOLOSTAR 100 UNIT/ML ~~LOC~~ SOPN: PEN_INJECTOR | SUBCUTANEOUS | 4 refills | Status: DC

## 2021-01-01 MED ORDER — INSULIN LISPRO (1 UNIT DIAL) 100 UNIT/ML (KWIKPEN): PEN_INJECTOR | SUBCUTANEOUS | 3 refills | Status: DC

## 2021-01-01 MED ORDER — LISINOPRIL 5 MG PO TABS: 5.0000 mg | ORAL_TABLET | Freq: Every day | ORAL | 3 refills | Status: DC

## 2021-01-01 MED FILL — HUMALOG 100 UNITS/ML KWIKPE: 100 | 17 days supply | Qty: 9 | Fill #0

## 2021-01-01 MED FILL — LANTUS SOLOSTAR 100 UNITS/M: 100 | 30 days supply | Qty: 21 | Fill #0

## 2021-01-01 MED FILL — METFORMIN HCL ER 500 MG TB2: 500 | 30 days supply | Qty: 120 | Fill #0

## 2021-01-01 MED FILL — LISINOPRIL 5 MG TABS: 5 | 30 days supply | Qty: 30 | Fill #0

## 2021-02-16 MED FILL — ATORVASTATIN 40 MG TABLET: 40 | 30 days supply | Qty: 30 | Fill #4

## 2021-02-16 MED FILL — CONTOUR NEXT STRIPS: 25 days supply | Qty: 100 | Fill #5

## 2021-02-16 MED FILL — HUMALOG 100 UNITS/ML KWIKPE: 100 | 17 days supply | Qty: 9 | Fill #1

## 2021-02-16 MED FILL — GABAPENTIN 300 MG CAPSULE: 300 | 30 days supply | Qty: 270 | Fill #4

## 2021-02-16 MED FILL — METFORMIN HCL ER 500 MG TB2: 500 | 30 days supply | Qty: 120 | Fill #1

## 2021-02-16 MED FILL — LISINOPRIL 5 MG TABS: 5 | 30 days supply | Qty: 30 | Fill #1

## 2021-02-16 MED FILL — LANTUS SOLOSTAR 100 UNITS/M: 100 | 30 days supply | Qty: 21 | Fill #1

## 2021-03-30 ENCOUNTER — Other Ambulatory Visit (HOSPITAL_COMMUNITY): Payer: Self-pay

## 2021-03-30 MED FILL — Insulin Lispro Soln Pen-injector 100 Unit/ML (1 Unit Dial): SUBCUTANEOUS | 16 days supply | Qty: 9 | Fill #0 | Status: CN

## 2021-03-30 MED FILL — Glucose Blood Test Strip: 25 days supply | Qty: 100 | Fill #0 | Status: CN

## 2021-03-30 MED FILL — Atorvastatin Calcium Tab 40 MG (Base Equivalent): ORAL | 30 days supply | Qty: 30 | Fill #0 | Status: CN

## 2021-03-30 MED FILL — Gabapentin Cap 300 MG: ORAL | 30 days supply | Qty: 270 | Fill #0 | Status: CN

## 2021-03-30 MED FILL — Lisinopril Tab 5 MG: ORAL | 30 days supply | Qty: 30 | Fill #0 | Status: CN

## 2021-03-30 MED FILL — Insulin Glargine Soln Pen-Injector 100 Unit/ML: SUBCUTANEOUS | 17 days supply | Qty: 12 | Fill #0 | Status: CN

## 2021-04-07 ENCOUNTER — Other Ambulatory Visit (HOSPITAL_COMMUNITY): Payer: Self-pay

## 2021-04-09 ENCOUNTER — Other Ambulatory Visit: Payer: Self-pay | Admitting: Internal Medicine

## 2021-04-09 ENCOUNTER — Other Ambulatory Visit (HOSPITAL_COMMUNITY): Payer: Self-pay

## 2021-04-09 DIAGNOSIS — IMO0002 Reserved for concepts with insufficient information to code with codable children: Secondary | ICD-10-CM

## 2021-04-09 DIAGNOSIS — E1165 Type 2 diabetes mellitus with hyperglycemia: Secondary | ICD-10-CM

## 2021-04-09 MED FILL — Insulin Glargine Soln Pen-Injector 100 Unit/ML: SUBCUTANEOUS | 17 days supply | Qty: 12 | Fill #0 | Status: AC

## 2021-04-09 MED FILL — Insulin Lispro Soln Pen-injector 100 Unit/ML (1 Unit Dial): SUBCUTANEOUS | 17 days supply | Qty: 9 | Fill #0 | Status: AC

## 2021-04-09 MED FILL — Gabapentin Cap 300 MG: ORAL | 30 days supply | Qty: 270 | Fill #0 | Status: AC

## 2021-04-09 MED FILL — Atorvastatin Calcium Tab 40 MG (Base Equivalent): ORAL | 30 days supply | Qty: 30 | Fill #0 | Status: AC

## 2021-04-09 MED FILL — Metformin HCl Tab ER 24HR 500 MG: ORAL | 30 days supply | Qty: 120 | Fill #0 | Status: AC

## 2021-04-09 MED FILL — Lisinopril Tab 5 MG: ORAL | 30 days supply | Qty: 30 | Fill #0 | Status: AC

## 2021-04-10 ENCOUNTER — Other Ambulatory Visit (HOSPITAL_COMMUNITY): Payer: Self-pay

## 2021-04-12 ENCOUNTER — Other Ambulatory Visit (HOSPITAL_COMMUNITY): Payer: Self-pay

## 2021-04-12 ENCOUNTER — Other Ambulatory Visit: Payer: Self-pay | Admitting: *Deleted

## 2021-04-12 DIAGNOSIS — IMO0002 Reserved for concepts with insufficient information to code with codable children: Secondary | ICD-10-CM

## 2021-04-12 DIAGNOSIS — E1165 Type 2 diabetes mellitus with hyperglycemia: Secondary | ICD-10-CM

## 2021-04-12 MED ORDER — GLUCOSE BLOOD VI STRP
ORAL_STRIP | 1 refills | Status: DC
  Filled 2021-04-12: qty 400, 100d supply, fill #0

## 2021-04-13 ENCOUNTER — Other Ambulatory Visit (HOSPITAL_COMMUNITY): Payer: Self-pay

## 2021-04-13 MED ORDER — GLUCOSE BLOOD VI STRP
ORAL_STRIP | 1 refills | Status: DC
  Filled 2021-04-13: qty 100, 25d supply, fill #0
  Filled 2021-05-24: qty 100, 25d supply, fill #1
  Filled 2021-07-05: qty 100, 25d supply, fill #2
  Filled 2021-08-17: qty 100, 25d supply, fill #3
  Filled 2021-09-06: qty 100, 25d supply, fill #4

## 2021-04-13 NOTE — Addendum Note (Signed)
Addended by: Asencion Noble on: 04/13/2021 08:57 AM   Modules accepted: Orders

## 2021-04-14 ENCOUNTER — Observation Stay (HOSPITAL_COMMUNITY): Payer: Medicaid Other

## 2021-04-14 ENCOUNTER — Observation Stay (HOSPITAL_COMMUNITY)
Admission: AD | Admit: 2021-04-14 | Discharge: 2021-04-15 | Disposition: A | Discharge status: Home / Self Care | Payer: Medicaid Other | Source: Ambulatory Visit | Attending: Internal Medicine | Admitting: Internal Medicine

## 2021-04-14 ENCOUNTER — Encounter: Payer: Self-pay | Admitting: Internal Medicine

## 2021-04-14 ENCOUNTER — Ambulatory Visit (INDEPENDENT_AMBULATORY_CARE_PROVIDER_SITE_OTHER): Payer: Medicaid Other | Admitting: Internal Medicine

## 2021-04-14 ENCOUNTER — Other Ambulatory Visit: Payer: Self-pay

## 2021-04-14 VITALS — BP 139/73 | HR 85 | Temp 98.2°F | Ht 73.0 in | Wt 298.6 lb

## 2021-04-14 DIAGNOSIS — I10 Essential (primary) hypertension: Secondary | ICD-10-CM | POA: Diagnosis not present

## 2021-04-14 DIAGNOSIS — Z7984 Long term (current) use of oral hypoglycemic drugs: Secondary | ICD-10-CM | POA: Diagnosis not present

## 2021-04-14 DIAGNOSIS — I152 Hypertension secondary to endocrine disorders: Secondary | ICD-10-CM

## 2021-04-14 DIAGNOSIS — J45909 Unspecified asthma, uncomplicated: Secondary | ICD-10-CM | POA: Insufficient documentation

## 2021-04-14 DIAGNOSIS — I96 Gangrene, not elsewhere classified: Secondary | ICD-10-CM

## 2021-04-14 DIAGNOSIS — K219 Gastro-esophageal reflux disease without esophagitis: Secondary | ICD-10-CM

## 2021-04-14 DIAGNOSIS — Z794 Long term (current) use of insulin: Secondary | ICD-10-CM

## 2021-04-14 DIAGNOSIS — I998 Other disorder of circulatory system: Secondary | ICD-10-CM | POA: Diagnosis not present

## 2021-04-14 DIAGNOSIS — E1142 Type 2 diabetes mellitus with diabetic polyneuropathy: Secondary | ICD-10-CM

## 2021-04-14 DIAGNOSIS — S91109A Unspecified open wound of unspecified toe(s) without damage to nail, initial encounter: Secondary | ICD-10-CM

## 2021-04-14 DIAGNOSIS — M869 Osteomyelitis, unspecified: Secondary | ICD-10-CM

## 2021-04-14 DIAGNOSIS — Z87891 Personal history of nicotine dependence: Secondary | ICD-10-CM | POA: Insufficient documentation

## 2021-04-14 DIAGNOSIS — E785 Hyperlipidemia, unspecified: Secondary | ICD-10-CM | POA: Diagnosis not present

## 2021-04-14 DIAGNOSIS — E1140 Type 2 diabetes mellitus with diabetic neuropathy, unspecified: Secondary | ICD-10-CM | POA: Diagnosis not present

## 2021-04-14 DIAGNOSIS — E1159 Type 2 diabetes mellitus with other circulatory complications: Secondary | ICD-10-CM | POA: Diagnosis not present

## 2021-04-14 DIAGNOSIS — Z79899 Other long term (current) drug therapy: Secondary | ICD-10-CM | POA: Insufficient documentation

## 2021-04-14 DIAGNOSIS — M86171 Other acute osteomyelitis, right ankle and foot: Secondary | ICD-10-CM | POA: Diagnosis present

## 2021-04-14 HISTORY — DX: Gangrene, not elsewhere classified: I96

## 2021-04-14 LAB — COMPREHENSIVE METABOLIC PANEL
ALT: 34 U/L (ref 0–44)
AST: 28 U/L (ref 15–41)
Albumin: 3.6 g/dL (ref 3.5–5.0)
Alkaline Phosphatase: 89 U/L (ref 38–126)
Anion gap: 3 — ABNORMAL LOW (ref 5–15)
BUN: 10 mg/dL (ref 6–20)
CO2: 31 mmol/L (ref 22–32)
Calcium: 9.1 mg/dL (ref 8.9–10.3)
Chloride: 107 mmol/L (ref 98–111)
Creatinine, Ser: 0.83 mg/dL (ref 0.44–1.00)
GFR, Estimated: >60 mL/min (ref >60)
Glucose, Bld: 158 mg/dL — ABNORMAL HIGH (ref 70–99)
Potassium: 4.3 mmol/L (ref 3.5–5.1)
Sodium: 141 mmol/L (ref 135–145)
Total Bilirubin: 0.8 mg/dL (ref 0.3–1.2)
Total Protein: 6.7 g/dL (ref 6.5–8.1)

## 2021-04-14 LAB — BASIC METABOLIC PANEL
Anion gap: 7 (ref 5–15)
BUN: 7 mg/dL (ref 6–20)
CO2: 29 mmol/L (ref 22–32)
Calcium: 9.3 mg/dL (ref 8.9–10.3)
Chloride: 106 mmol/L (ref 98–111)
Creatinine, Ser: 0.61 mg/dL (ref 0.44–1.00)
GFR, Estimated: >60 mL/min (ref >60)
Glucose, Bld: 74 mg/dL (ref 70–99)
Potassium: 4.2 mmol/L (ref 3.5–5.1)
Sodium: 142 mmol/L (ref 135–145)

## 2021-04-14 LAB — CBC
HCT: 40.1 % (ref 36.0–46.0)
Hemoglobin: 12.5 g/dL (ref 12.0–15.0)
MCH: 31.2 pg (ref 26.0–34.0)
MCHC: 31.2 g/dL (ref 30.0–36.0)
MCV: 100 fL (ref 80.0–100.0)
Platelets: 269 10*3/uL (ref 150–400)
RBC: 4.01 MIL/uL (ref 3.87–5.11)
RDW: 12.6 % (ref 11.5–15.5)
WBC: 8 10*3/uL (ref 4.0–10.5)
nRBC: 0 % (ref 0.0–0.2)

## 2021-04-14 LAB — POCT GLYCOSYLATED HEMOGLOBIN (HGB A1C): Hemoglobin A1C: 9.6 % — AB (ref 4.0–5.6)

## 2021-04-14 LAB — GLUCOSE, CAPILLARY
Glucose-Capillary: 90 mg/dL (ref 70–99)
Glucose-Capillary: 116 mg/dL — ABNORMAL HIGH (ref 70–99)
Glucose-Capillary: 175 mg/dL — ABNORMAL HIGH (ref 70–99)

## 2021-04-14 LAB — SEDIMENTATION RATE: Sed Rate: 27 mm/hr — ABNORMAL HIGH (ref 0–22)

## 2021-04-14 LAB — C-REACTIVE PROTEIN: CRP: 1 mg/dL — ABNORMAL HIGH (ref <1.0)

## 2021-04-14 MED ORDER — INSULIN LISPRO (1 UNIT DIAL) 100 UNIT/ML (KWIKPEN): 18.0000 [IU] | PEN_INJECTOR | Freq: Three times a day (TID) | SUBCUTANEOUS | 1 refills | Status: DC

## 2021-04-14 MED ORDER — HYDROCHLOROTHIAZIDE 12.5 MG PO CAPS: 12.5000 mg | ORAL_CAPSULE | Freq: Every day | ORAL | Status: DC

## 2021-04-14 MED ORDER — ATORVASTATIN CALCIUM 40 MG PO TABS
40.0000 mg | ORAL_TABLET | Freq: Every day | ORAL | Status: DC
  Administered 2021-04-14 – 2021-04-15 (×2): 40 mg via ORAL
  Filled 2021-04-14 (×2): qty 1

## 2021-04-14 MED ORDER — LISINOPRIL 5 MG PO TABS
5.0000 mg | ORAL_TABLET | Freq: Every day | ORAL | Status: DC
  Administered 2021-04-14 – 2021-04-15 (×2): 5 mg via ORAL
  Filled 2021-04-14 (×2): qty 1

## 2021-04-14 MED ORDER — DICLOFENAC SODIUM 1 % EX GEL
4.0000 g | Freq: Four times a day (QID) | CUTANEOUS | Status: DC
  Filled 2021-04-14: qty 100

## 2021-04-14 MED ORDER — INSULIN ASPART 100 UNIT/ML IJ SOLN
0.0000 [IU] | Freq: Three times a day (TID) | INTRAMUSCULAR | Status: DC
  Administered 2021-04-15: 5 [IU] via SUBCUTANEOUS
  Administered 2021-04-15: 11 [IU] via SUBCUTANEOUS
  Administered 2021-04-15: 3 [IU] via SUBCUTANEOUS

## 2021-04-14 MED ORDER — INSULIN GLARGINE 100 UNIT/ML ~~LOC~~ SOLN
35.0000 [IU] | Freq: Every day | SUBCUTANEOUS | Status: DC
  Administered 2021-04-14: 35 [IU] via SUBCUTANEOUS
  Filled 2021-04-14 (×2): qty 0.35

## 2021-04-14 MED ORDER — HYDROCHLOROTHIAZIDE 12.5 MG PO CAPS
12.5000 mg | ORAL_CAPSULE | Freq: Every day | ORAL | 0 refills | Status: DC
Start: 2021-04-14 — End: 2021-04-14

## 2021-04-14 MED ORDER — DAPAGLIFLOZIN PROPANEDIOL 5 MG PO TABS: 5.0000 mg | ORAL_TABLET | Freq: Every day | ORAL | Status: DC

## 2021-04-14 MED ORDER — PANTOPRAZOLE SODIUM 40 MG PO TBEC
40.0000 mg | DELAYED_RELEASE_TABLET | Freq: Every day | ORAL | Status: DC
  Administered 2021-04-14 – 2021-04-15 (×2): 40 mg via ORAL
  Filled 2021-04-14 (×2): qty 1

## 2021-04-14 MED ORDER — HEPARIN SODIUM (PORCINE) 5000 UNIT/ML IJ SOLN
5000.0000 [IU] | Freq: Three times a day (TID) | INTRAMUSCULAR | Status: DC
  Administered 2021-04-14 – 2021-04-15 (×3): 5000 [IU] via SUBCUTANEOUS
  Filled 2021-04-14 (×3): qty 1

## 2021-04-14 MED ORDER — INSULIN GLARGINE 100 UNIT/ML SOLOSTAR PEN: 70.0000 [IU] | PEN_INJECTOR | Freq: Every evening | SUBCUTANEOUS | 1 refills | Status: DC

## 2021-04-14 MED ORDER — FISH OIL 1000 MG PO CAPS: 1.0000 | ORAL_CAPSULE | Freq: Every evening | ORAL | Status: DC

## 2021-04-14 MED ORDER — ADULT MULTIVITAMIN W/MINERALS CH
1.0000 | ORAL_TABLET | Freq: Every evening | ORAL | Status: DC
  Administered 2021-04-14 – 2021-04-15 (×2): 1 via ORAL
  Filled 2021-04-14 (×2): qty 1

## 2021-04-14 MED ORDER — GABAPENTIN 300 MG PO CAPS
900.0000 mg | ORAL_CAPSULE | Freq: Three times a day (TID) | ORAL | Status: DC
  Administered 2021-04-14 – 2021-04-15 (×3): 900 mg via ORAL
  Filled 2021-04-14 (×3): qty 3

## 2021-04-14 MED ORDER — METFORMIN HCL ER 500 MG PO TB24: 1000.0000 mg | ORAL_TABLET | Freq: Two times a day (BID) | ORAL | Status: DC

## 2021-04-14 MED ORDER — DAPAGLIFLOZIN PROPANEDIOL 5 MG PO TABS: 5.0000 mg | ORAL_TABLET | Freq: Every day | ORAL | 1 refills | Status: DC

## 2021-04-14 NOTE — Patient Instructions (Addendum)
[name],  It was a pleasure to see you today. Thank you for coming in.   Today we discussed our foot wound. I am admitting you to the hospital for further imaging and evaluation.   Thank you,   Lonia Skinner

## 2021-04-14 NOTE — Progress Notes (Signed)
   CC: Right toe wound  HPI:  [name] is a 60 y.o. with history of hypertension, GERD, diabetes, diabetic neuropathy, history of left first and second digit osteomyelitis status post amputation, and severe obesity who is presenting with a right large toe wound.  Past Medical History:  Diagnosis Date  . Anemia   . Asthma 11/01/2010  . BPPV (benign paroxysmal positional vertigo) 03/07/2013  . Diabetes mellitus    since 1983  . Elevated TSH 05/13/2013  . GERD (gastroesophageal reflux disease)   . Hypertension   . Left elbow pain 05/05/2016  . Left shoulder pain 05/05/2016  . Osteomyelitis ([place])   . Pneumonia    as a child  . Wrist pain 04/11/2013   Review of Systems:   Constitutional: Negative for chills and fever.  Respiratory: Negative for shortness of breath.   Cardiovascular: Negative for chest pain and leg swelling.  Gastrointestinal: Negative for abdominal pain, nausea and vomiting. MSK: Positive for right toe swelling and drainage.  Neurological: Negative for dizziness and headaches.    Physical Exam:  Vitals:   04/14/21 1009 04/14/21 1020  BP: (!) 164/79 139/73  Pulse: 95 85  Temp: 98.2 F (36.8 C)   TempSrc: Oral   SpO2: 100%   Weight: 298 lb 9.6 oz (135.4 kg)   Height: 6\' 1"  (1.854 m)    Physical Exam Constitutional:      Appearance: Normal appearance.  Cardiovascular:     Rate and Rhythm: Normal rate and regular rhythm.     Pulses:          Dorsalis pedis pulses are 1+ on the right side and 1+ on the left side.     Heart sounds: Normal heart sounds.  Pulmonary:     Effort: Pulmonary effort is normal.     Breath sounds: Normal breath sounds.  Abdominal:     General: Abdomen is flat. Bowel sounds are normal.     Palpations: Abdomen is soft.  Musculoskeletal:       Feet:  Neurological:     General: No focal deficit present.     Mental Status: She is alert and oriented to person, place, and time.              Assessment & Plan:    See Encounters Tab for problem based charting.  Patient seen with Dr. Evette Doffing

## 2021-04-14 NOTE — Progress Notes (Deleted)
Checks feet often because she is diabetic. About last Monday she was cleaning her foot and removed a callus. Right large toe wound, located around the lateral side. Had some drainage initially, used some wound care bandage on the silver something (sulfaziazine). Soaking feet every few days and will apply dry bandage. Had some swelling in her foot however this has improved, now has minimal drainage.  Denies fevers, chills, nausea,  Typically will had a lot of drainage and foot swelling and can barely get foot into shoe.  June 2nd has wound care appointment Left wound under ball of foot, antibiotics at home, left 1st and 2nd toe amputation, 2013-14.   Hasn't had meds for awhile, wasn't working for [employer]. On lantus 70 units daily and humulog 18 units TID, metformin 1000 mg BID. Tried to start ozempic, ran out and she hasn't been taking it. Her daughter is on [place].   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

## 2021-04-14 NOTE — H&P (Addendum)
Date: 04/14/2021               Patient Name:  [name] MRN: [id number]  DOB: Sep 22, 1961 Age / Sex: 60 y.o., female   PCP: Cato Mulligan, MD         Medical Service: Internal Medicine Teaching Service         Attending Physician: Dr. Jimmye Norman, Elaina Pattee, MD    First Contact: Dr. Rosita Kea Pager: [phone]  Second Contact: Gilford Rile, MD, Remo Lipps Pager: SW [phone])       After Hours (After 5p/  First Contact Pager: [phone]  weekends / holidays): Second Contact Pager: [phone]   Chief Complaint: Right toe gangrene   History of Present Illness: [name] is a 60 year old woman with medical history of insulin-dependent O2UM complicated by peripheral neuropathy s/p L 1st and 2nd amputations, left second metatarsal osteomyelitis in 2019 status post 6 weeks of IV ertapenem, diabetic ulcer of the left foot, hypertension, obesity, asthma, GERD, hyperlipidemia who presented to the clinic with right foot wound  [name] reports that she was in her usual state of health until last Monday when she noticed drainage of the right great toe while doing her routine foot check.  This had just happened after she had pulled off a piece of dry skin/callus.  She proceeded to soak her feet in a solution of alcohol/peroxide/water and also cleaned with water.  She subsequently dressed her right great toe wound.  After couple days, she has noticed that her toe was swollen however she continued to manage the drainage with her home regimen.  She did not notice any improvement and subsequently called her wound care clinic to schedule an appointment the first week of June.  She also called [facility] to schedule an appointment as well.  Today, when she presented to the clinic, she had noticed that the drainage was minimal and the swelling had improved.  Since onset, she has not experienced fevers, chills, chest pain, shortness of breath, nausea, vomiting, headache, dizziness, syncope, vision abnormalities.  In regards  to management of her diabetes, she states that she has not been able to take her medications including Lantus 70 units daily, Humalog 18 units 3 times daily and metformin 1000 mg 3 times a day.  In the past, she tried Ozempic however she ran out.  She tries as much as she can to check her blood sugar at home and the highest number she is going was in the 290s.   Labs: *CBC *Basic metabolic panel  Meds:  No current facility-administered medications on file prior to encounter.   Current Outpatient Medications on File Prior to Encounter  Medication Sig  . atorvastatin (LIPITOR) 40 MG tablet Take 1 tablet (40 mg total) by mouth daily.  . blood glucose meter kit and supplies KIT Dispense based on patient and insurance preference. Use up to four times daily as directed. (FOR ICD-9 250.00, 250.01).  . dapagliflozin propanediol (FARXIGA) 5 MG TABS tablet Take 1 tablet (5 mg total) by mouth daily before breakfast.  . diclofenac Sodium (VOLTAREN) 1 % GEL Apply 4 g topically 4 (four) times daily.  Marland Kitchen gabapentin (NEURONTIN) 300 MG capsule Take 3 capsules (900 mg total) by mouth 3 (three) times daily.  Marland Kitchen glucose blood test strip USE TO CHECK BLOOD SUGAR 4 TIMES DAILY.  . hydrochlorothiazide (MICROZIDE) 12.5 MG capsule Take 1 capsule (12.5 mg total) by mouth daily.  . insulin glargine (LANTUS) 100 UNIT/ML Solostar Pen Inject  70 Units into the skin at bedtime.  . insulin lispro (HUMALOG) 100 UNIT/ML KwikPen Inject 18 Units into the skin 3 (three) times daily with meals.  . Lancets MISC 1 each by Does not apply route 4 (four) times daily -  before meals and at bedtime.  Marland Kitchen lisinopril (ZESTRIL) 5 MG tablet Take 1 tablet (5 mg total) by mouth daily.  . metFORMIN (GLUCOPHAGE-XR) 500 MG 24 hr tablet Take 2 tablets (1,000 mg total) by mouth 2 (two) times daily with a meal.  . Multiple Vitamin (MULTIVITAMIN WITH MINERALS) TABS tablet Take 1 tablet by mouth every evening.   . Omega-3 Fatty Acids (FISH OIL) 1000 MG  CAPS Take 1 capsule by mouth every evening.  Marland Kitchen omeprazole (PRILOSEC) 20 MG capsule Take 1 capsule (20 mg total) by mouth daily.     Allergies: Allergies as of 04/14/2021 - Review Complete 04/14/2021  Allergen Reaction Noted  . Aspirin Nausea And Vomiting 12/03/2010  . Ibuprofen Nausea And Vomiting and Other (See Comments) 11/01/2010  . Penicillins Other (See Comments) 11/01/2010   Past Medical History:  Diagnosis Date  . Anemia   . Asthma 11/01/2010  . BPPV (benign paroxysmal positional vertigo) 03/07/2013  . Diabetes mellitus    since 1983  . Elevated TSH 05/13/2013  . GERD (gastroesophageal reflux disease)   . Hypertension   . Left elbow pain 05/05/2016  . Left shoulder pain 05/05/2016  . Osteomyelitis ([place])   . Pneumonia    as a child  . Wrist pain 04/11/2013    Family History  Problem Relation Age of Onset  . Diabetes Father   . Hypertension Father   . Hypertension Mother   . Dementia Mother   . Diabetes Daughter     Social History   Socioeconomic History  . Marital status: Divorced    Spouse name: Not on file  . Number of children: Not on file  . Years of education: Not on file  . Highest education level: Not on file  Occupational History  . Not on file  Tobacco Use  . Smoking status: Former Smoker    Quit date: 11/28/1962    Years since quitting: 58.4  . Smokeless tobacco: Never Used  Vaping Use  . Vaping Use: Never used  Substance and Sexual Activity  . Alcohol use: No    Alcohol/week: 0.0 standard drinks  . Drug use: No  . Sexual activity: Not on file  Other Topics Concern  . Not on file  Social History Narrative  . Not on file   Social Determinants of Health   Financial Resource Strain: Not on file  Food Insecurity: Not on file  Transportation Needs: Not on file  Physical Activity: Not on file  Stress: Not on file  Social Connections: Not on file    Review of Systems: A complete ROS was negative except as per HPI.   Physical Exam: Blood  pressure-139/73 Pulse 85 Temperature 98.2 F SPO2 100% on room air Weight 298 pounds Height: 61  Physical Exam Vitals and nursing note reviewed.  Constitutional:      Appearance: She is obese.  HENT:     Head: Normocephalic and atraumatic.  Eyes:     Conjunctiva/sclera: Conjunctivae normal.  Cardiovascular:     Rate and Rhythm: Normal rate.     Pulses:          Dorsalis pedis pulses are 3+ on the right side and 3+ on the left side.     Heart sounds:  Normal heart sounds. No murmur heard.   Pulmonary:     Breath sounds: Normal breath sounds. No wheezing or rales.  Abdominal:     General: Abdomen is flat. Bowel sounds are normal.     Palpations: Abdomen is soft.  Musculoskeletal:     Right foot: Normal range of motion. Deformity present. No Charcot foot, foot drop, laceration, tenderness, bony tenderness or crepitus. Normal pulse.     Left foot: Normal range of motion. Deformity (S/p first and second toe amputation) present. No tenderness or bony tenderness. Normal pulse.  Skin:    General: Skin is warm.     Findings: Wound (Right great toe) present.  Neurological:     Gait: Gait normal.  Psychiatric:        Mood and Affect: Mood normal.        Behavior: Behavior normal.           EKG: Obtain on admission  Assessment & Plan by Problem: Ms. Dosanjh is a 60 year old woman with medical history of insulin-dependent N4OE complicated by peripheral neuropathy s/p L 1st and 2nd amputations, left second metatarsal osteomyelitis in 2019 status post 6 weeks of IV ertapenem, diabetic ulcer of the left foot, hypertension, obesity, asthma, GERD, hyperlipidemia who initially presented to the clinic with right great toe wound with concern for limb ischemia versus osteomyelitis   #Right great toe gangrene concern for microvascular Ischemic toe vs osteomyelitis  Patient has a longstanding history of uncontrolled diabetes mellitus and is at risk for peripheral vascular disease given  that she has had prior toe amputations.  Presenting with about a 1 week history of wound and drainage of the lateral aspect of the right great toe without any systemic findings.  It is reassuring that she has great dorsalis pedis pulses.  The right foot is not cyanotic or pale.  A direct admission orders were placed from the clinic however patient states that she would like to go home and grab some personal belongings.  I did instruct to keep her phone is a hospital bed becomes available.  CBC and metabolic panel were obtained in the clinic -Will place an order for MRI right foot w/ and w/o contrast once BMP results are available -Follow-up ABI. Will consult VVS vs podiatry based on results -Follow-up CBC, basic metabolic panel, ESR, CRP -Hold off on IV antibiotics or IV anticoagulation for now   #Hypertension -Continue home antihypertensives (hydrochlorothiazide 12.5 mg daily and lisinopril 5 mg daily)   #Insulin-dependent diabetes mellitus States that she has been off her medications for a while. Last A1C 9.6% -Lantus 35 units nightly -SSI -Hold Farxiga and metformin   #Diabetic neuropathy -Continue gabapentin   #Hyperlipidemia -Continue Lipitor   #GERD -Continue Protonix 40 mg daily   FEN: Carb modified/heart healthy VTE ppx: Subcutaneous heparin for now.  Can switch to subcutaneous Lovenox once metabolic panel returns CODE STATUS: Full code  Prior to Admission Living Arrangement: Home Anticipated Discharge Location: Home Barriers to Discharge: Treatment of right great toe wound  Dispo: Admit patient to Observation with expected length of stay less than 2 midnights.  Signed: Jean Rosenthal, MD 04/14/2021, 12:07 PM  Pager: [phone] Internal Medicine Teaching Service After 5pm on weekdays and 1pm on weekends: On Call pager: [phone]

## 2021-04-14 NOTE — Progress Notes (Signed)
Pt arrived to unit as direct admit from home. Pt alert/oriented in no acute distress. Situated/orientated to room/equipments . Menu guide provided with instructions. Pt verbalized understanding of instructions . Hospital valuables has been discussed lengthiy with no complaints.

## 2021-04-14 NOTE — Assessment & Plan Note (Signed)
Patient reports that last Monday she was cleaning her right foot, she often checks her feet due to her history of diabetes, and then she noted a callus on the area and after removing the callus she started having some drainage, reports that the drainage was yellow in color.  She had some leftover wound care bandages that she started using, she also started soaking her feet every few days and would apply the bandage.  She initially had some swelling in her foot however that had improved.  She denied any fevers, chills, nausea, vomiting, chest pain, shortness of breath, toe pain, or difficulty walking. She made an appointment with the wound care clinic in early July, and also made an appointment to come into the clinic to be evaluated.  She has a significant history of a left foot osteomyelitis status post first and second toe amputation around 2013, she states that she had to be on long-term antibiotics and wanted to come in to make sure that this would be okay. On exam her right first digit darkened skin discoloration on the distal aspect of her toe, significant thickening of her toenail with minimal clear drainage on the lateral aspect of her first toe, no warmth, erythema, or swelling noted, pulses 1+ and equal bilaterally.  Symptoms concerning for ischemic toe versus osteomyelitis.  Patient is hemodynamically stable however she has no insurance and outpatient work-up would be prolonged.  Patient admitted to obtain further imaging and potential need for vascular surgery evaluation. -Patient directly admitted to internal medicine

## 2021-04-15 ENCOUNTER — Other Ambulatory Visit (HOSPITAL_COMMUNITY): Payer: Self-pay

## 2021-04-15 ENCOUNTER — Observation Stay (HOSPITAL_COMMUNITY): Payer: Medicaid Other

## 2021-04-15 ENCOUNTER — Observation Stay (HOSPITAL_BASED_OUTPATIENT_CLINIC_OR_DEPARTMENT_OTHER): Payer: Medicaid Other

## 2021-04-15 DIAGNOSIS — I10 Essential (primary) hypertension: Secondary | ICD-10-CM

## 2021-04-15 DIAGNOSIS — M86172 Other acute osteomyelitis, left ankle and foot: Secondary | ICD-10-CM

## 2021-04-15 DIAGNOSIS — I96 Gangrene, not elsewhere classified: Secondary | ICD-10-CM | POA: Diagnosis not present

## 2021-04-15 DIAGNOSIS — M86171 Other acute osteomyelitis, right ankle and foot: Secondary | ICD-10-CM | POA: Diagnosis not present

## 2021-04-15 DIAGNOSIS — K219 Gastro-esophageal reflux disease without esophagitis: Secondary | ICD-10-CM | POA: Diagnosis not present

## 2021-04-15 DIAGNOSIS — E119 Type 2 diabetes mellitus without complications: Secondary | ICD-10-CM

## 2021-04-15 LAB — GLUCOSE, CAPILLARY
Glucose-Capillary: 187 mg/dL — ABNORMAL HIGH (ref 70–99)
Glucose-Capillary: 244 mg/dL — ABNORMAL HIGH (ref 70–99)
Glucose-Capillary: 313 mg/dL — ABNORMAL HIGH (ref 70–99)

## 2021-04-15 LAB — CBC
HCT: 36.8 % (ref 36.0–46.0)
Hemoglobin: 11.5 g/dL — ABNORMAL LOW (ref 12.0–15.0)
MCH: 31.3 pg (ref 26.0–34.0)
MCHC: 31.3 g/dL (ref 30.0–36.0)
MCV: 100 fL (ref 80.0–100.0)
Platelets: 231 10*3/uL (ref 150–400)
RBC: 3.68 MIL/uL — ABNORMAL LOW (ref 3.87–5.11)
RDW: 12.7 % (ref 11.5–15.5)
WBC: 5.6 10*3/uL (ref 4.0–10.5)
nRBC: 0 % (ref 0.0–0.2)

## 2021-04-15 LAB — BASIC METABOLIC PANEL
Anion gap: 4 — ABNORMAL LOW (ref 5–15)
BUN: 10 mg/dL (ref 6–20)
CO2: 29 mmol/L (ref 22–32)
Calcium: 8.8 mg/dL — ABNORMAL LOW (ref 8.9–10.3)
Chloride: 108 mmol/L (ref 98–111)
Creatinine, Ser: 0.67 mg/dL (ref 0.44–1.00)
GFR, Estimated: >60 mL/min (ref >60)
Glucose, Bld: 171 mg/dL — ABNORMAL HIGH (ref 70–99)
Potassium: 3.9 mmol/L (ref 3.5–5.1)
Sodium: 141 mmol/L (ref 135–145)

## 2021-04-15 MED ORDER — CIPROFLOXACIN HCL 750 MG PO TABS
750.0000 mg | ORAL_TABLET | Freq: Two times a day (BID) | ORAL | 0 refills | Status: AC
  Filled 2021-04-15: qty 56, 28d supply, fill #0

## 2021-04-15 MED ORDER — CIPROFLOXACIN HCL 500 MG PO TABS
750.0000 mg | ORAL_TABLET | Freq: Two times a day (BID) | ORAL | Status: DC
  Administered 2021-04-15: 750 mg via ORAL
  Filled 2021-04-15: qty 2

## 2021-04-15 NOTE — Discharge Summary (Signed)
Name: [name] MRN: [id number] DOB: 1961-02-14 60 y.o. PCP: Cato Mulligan, MD  Date of Admission: 04/14/2021  5:27 PM Date of Discharge:  04/15/2021 Attending Physician: Angelica Pou, MD   Discharge Diagnosis: 1. Osteomyelitis of Rt Big toe 2. HTN 3. Insulin dependent T2DM 4. Diabetes Neuropathy 5. HLD 6. GERD  Discharge Medications: Allergies as of 04/15/2021      Reactions   Aspirin Nausea And Vomiting   Ibuprofen Nausea And Vomiting, Other (See Comments)   Abdominal pain   Penicillins Other (See Comments)   Unknown Did it involve swelling of the face/tongue/throat, SOB, or low BP? Unk Did it involve sudden or severe rash/hives, skin peeling, or any reaction on the inside of your mouth or nose? Unk  Did you need to seek medical attention at a hospital or doctor's office? Unk When did it last happen?Childhood If all above answers are "NO", may proceed with cephalosporin use.      Medication List    STOP taking these medications   b complex vitamins capsule     TAKE these medications   atorvastatin 40 MG tablet Commonly known as: LIPITOR Take 1 tablet (40 mg total) by mouth daily.   blood glucose meter kit and supplies Kit Dispense based on patient and insurance preference. Use up to four times daily as directed. (FOR ICD-9 250.00, 250.01).   ciprofloxacin 750 MG tablet Commonly known as: CIPRO Take 1 tablet (750 mg total) by mouth 2 (two) times daily for 28 days.   diclofenac Sodium 1 % Gel Commonly known as: Voltaren Apply 4 g topically 4 (four) times daily. What changed:   when to take this  reasons to take this   gabapentin 300 MG capsule Commonly known as: NEURONTIN Take 3 capsules (900 mg total) by mouth 3 (three) times daily.   glucose blood test strip USE TO CHECK BLOOD SUGAR 4 TIMES DAILY.   insulin glargine 100 UNIT/ML Solostar Pen Commonly known as: LANTUS Inject 70 Units into the skin at bedtime. What changed: when  to take this   insulin lispro 100 UNIT/ML KwikPen Commonly known as: HUMALOG Inject 18 Units into the skin 3 (three) times daily with meals.   Lancets Misc 1 each by Does not apply route 4 (four) times daily -  before meals and at bedtime.   lisinopril 5 MG tablet Commonly known as: ZESTRIL Take 1 tablet (5 mg total) by mouth daily.   metFORMIN 500 MG 24 hr tablet Commonly known as: GLUCOPHAGE-XR Take 2 tablets (1,000 mg total) by mouth 2 (two) times daily with a meal.   multivitamin with minerals Tabs tablet Take 1 tablet by mouth every evening.   omeprazole 20 MG capsule Commonly known as: PRILOSEC Take 1 capsule (20 mg total) by mouth daily.       Disposition and follow-up:   [name] was discharged from [facility] in Stable condition.  At the hospital follow up visit please address:  1.  Follow up: Marland Kitchen Follow up with wound care in 1 day.  . Follow up with Dr Sharol Given in 1 week.   2.  Labs / imaging needed at time of follow-up: None  3.  Pending labs/ test needing follow-up: None  Follow-up Appointments:  Follow-up Information    Newt Minion, MD Follow up in 1 week(s).   Specialty: Orthopedic Surgery Contact information: [place] [address] [phone]        Cato Mulligan, MD Follow  up in 1 week(s).   Specialty: Internal Medicine Contact information: [place] [address] [phone]             - Follow up with wound care clinic as early as tomorrow.  - Follow up with PCP in 1 week.  - Follow up with vascular surgeon Next week.   Hospital Course by problem list: #Right great toegangreneconcern formicrovascularIschemic toevs osteomyelitis  Presenting with about a 1 week history of wound and drainage of the lateral aspect of the right great toe without any systemic findings. H/o DM with peripheral vascular disease.  On exam, Big ulcer present on Rt Big toe. Dorsalis pedes pulse  present.  BMP wnl except high BG. Renal function normal. CRP elevated to >1. ESR normal.  ABI normal. MRI shows early signs of osteomyelitis. Talked Vascular surgeon who recommended to start antibiotics with follow up on outpatient with Dr. Sharol Given next week. Pt was started on ciprofloxacin 750 mg for 28 days at discharged and recommended to follow up with wound care this week and Follow with Dr. Sharol Given next week.   #Hypertension  Pt's BP stable during inpatient. Pt was continued with home antihypertensives (hydrochlorothiazide 12.5 mg daily and lisinopril 5 mg daily)  #Insulin-dependent diabetes mellitus Pt has been off medications for a while. Last A1C 9.6% (04/14/21). Pt was started on Lantus 35 units nightly and Moderate SI. metformin held but restarted at discharge.   #Diabetic neuropathy #Hyperlipidemia #GERD Home medication continued.   Subjective on day of discharge: Pt is seen sitting at the chair comfortably. Denies any pain in the Rt toe. States she feels mild numbness in the toe but no tingling. Pt states this is not her first time getting infection or amputation. She has had ulcers, infection  multiple times but this time she doesn't feel that it is infected. She feels that its getting better and she can follow up with wound care and foot doctor outpatient. States she would like to go home soon but agrees to follow medical team advise.   Discharge Exam:   BP 114/64 (BP Location: Left Arm)   Pulse 69   Temp 97.8 F (36.6 C) (Oral)   Resp 18   LMP 06/23/2014   SpO2 100%  Discharge exam: Physical Exam Constitutional: Well groomed women sitting on the chair, not in acute distress.  Head: atraumatic Pulmonary: effort normal Skin: warm and dry Big Ulcer present at Rt Big toe with callus. No fluid coming out. Big callus present on the base of Rt foot. Ulcer is mildly tender to touch. Doesn't feel cold. Mild discoloration of Rt foot toes. Dorsalis pedes pulse present. Big toe Nail  dystrophic.  Neurological: alert, and oriented. Psychiatric: normal mood and affect  Pertinent Labs, Studies, and Procedures:  MR FOOT RIGHT WO CONTRAST  Result Date: 04/15/2021 CLINICAL DATA:  Diabetic foot wound EXAM: MRI OF THE RIGHT FOREFOOT WITHOUT CONTRAST TECHNIQUE: Multiplanar, multisequence MR imaging of the right forefoot was performed. No intravenous contrast was administered. COMPARISON:  X-ray 04/14/2021 FINDINGS: Bones/Joint/Cartilage Bone marrow edema throughout the distal phalanx of the right great toe (series 9, images 14-15). No definite erosion or cortical destruction. Intermediate T1 signal within the distal tuft (series 4, image 45) is suspicious for early acute osteomyelitis. Trace joint fluid within the interphalangeal joint of the great toe, which may be reactive. Preserved bone marrow signal within the great toe proximal phalanx. Remaining osseous structures are within normal limits. No fracture. No malalignment. No additional  sites of bone marrow edema or marrow replacement. Mild degenerative changes of the forefoot, most pronounced at the first MTP joint. Ligaments Intact Lisfranc ligament. Collateral ligaments of the forefoot appear intact. Muscles and Tendons Chronic denervation changes of the intrinsic foot musculature. Intact flexor and extensor tendons. No tenosynovitis. Soft tissues Soft tissue swelling of the great toe and dorsal forefoot. Nonspecific subcutaneous edema throughout the dorsal forefoot. No organized or drainable fluid collections. IMPRESSION: 1. Bone marrow edema throughout the right great toe distal phalanx with findings suggestive of early acute osteomyelitis involving the distal tuft. 2. Trace joint fluid within the interphalangeal joint of the great toe, which may be reactive. Septic arthritis not excluded. 3. Subcutaneous edema most pronounced at the dorsal aspect of the forefoot. No organized or drainable fluid collections. Electronically Signed   By:  Davina Poke D.O.   On: 04/15/2021 13:30   DG Foot Complete Right  Result Date: 04/14/2021 CLINICAL DATA:  Right first toe pain, possible osteomyelitis EXAM: RIGHT FOOT COMPLETE - 3+ VIEW COMPARISON:  03/18/2016 FINDINGS: Soft tissue irregularity is noted the tip of the first toe consistent with that seen on physical exam. No underlying bony erosive changes or fragmentation is noted to suggest osteomyelitis. No bony abnormality is seen. Calcaneal spurring is noted. IMPRESSION: No findings to suggest osteomyelitis. Electronically Signed   By: Inez Catalina M.D.   On: 04/14/2021 19:30   VAS [name] ABI WITH/WO TBI  Result Date: 04/15/2021  LOWER EXTREMITY DOPPLER STUDY Patient Name:  THAILY HACKWORTH  Date of Exam:   04/15/2021 Medical Rec #: [id number]       Accession #:    [id number] Date of Birth: 06/13/1961       Patient Gender: F Patient Age:   89Y Exam Location:  [facility] Procedure:      VAS [name] ABI WITH/WO TBI Referring Phys: [id number] [place] --------------------------------------------------------------------------------  Indications: Gangrene. High Risk Factors: Diabetes.  Limitations: Today's exam was limited due to Left great toe amputation and an              open wound. Comparison Study: 04/04/2016 - R 1.14 L 1.11 Performing Technologist: Carlos Levering RVT  Examination Guidelines: A complete evaluation includes at minimum, Doppler waveform signals and systolic blood pressure reading at the level of bilateral brachial, anterior tibial, and posterior tibial arteries, when vessel segments are accessible. Bilateral testing is considered an integral part of a complete examination. Photoelectric Plethysmograph (PPG) waveforms and toe systolic pressure readings are included as required and additional duplex testing as needed. Limited examinations for reoccurring indications may be performed as noted.  ABI Findings: +--------+------------------+-----+---------+--------+ Right   Rt  Pressure (mmHg)IndexWaveform Comment  +--------+------------------+-----+---------+--------+ [id number]                    triphasic         +--------+------------------+-----+---------+--------+ PTA     136               1.05 triphasic         +--------+------------------+-----+---------+--------+ DP      154               1.18 biphasic          +--------+------------------+-----+---------+--------+ +--------+------------------+-----+---------+-------+ Left    Lt Pressure (mmHg)IndexWaveform Comment +--------+------------------+-----+---------+-------+ [id number]                    triphasic        +--------+------------------+-----+---------+-------+ PTA  143               1.10 biphasic         +--------+------------------+-----+---------+-------+ DP      143               1.10 biphasic         +--------+------------------+-----+---------+-------+ +-------+-----------+-----------+------------+------------+ ABI/TBIToday's ABIToday's TBIPrevious ABIPrevious TBI +-------+-----------+-----------+------------+------------+ Right  1.18                  1.14                     +-------+-----------+-----------+------------+------------+ Left   1.1                   1.1                      +-------+-----------+-----------+------------+------------+  Summary: Right: Resting right ankle-brachial index is within normal range. No evidence of significant right lower extremity arterial disease. Unable to obtain TBI due to open wound. Left: Resting left ankle-brachial index is within normal range. No evidence of significant left lower extremity arterial disease. Unable to obtain TBI due to great toe amputation.  *See table(s) above for measurements and observations.  Electronically signed by Servando Snare MD on 04/15/2021 at 3:32:57 PM.    Final     Discharge Instructions: Discharge Instructions    Diet - low sodium heart healthy   Complete by: As  directed    Increase activity slowly   Complete by: As directed      Dear [name],   Thank you for letting us participate in your care! In this section, you will find a brief hospital admission summary of why you were admitted to the hospital, what happened during your admission, your diagnosis/diagnoses, and recommended follow up.   You were admitted because you were experiencing Foot ulcer.   Your testing revealed concerns of osteomyletis.   You were diagnosed with osteomylelitis.  You were treated with wound care and discharged on antibiotics.   Your condition remain stable and you were discharged from the hospital for meeting this goal.   POST-HOSPITAL & CARE INSTRUCTIONS 7. Follow up with wound care clinic as early as tomorrow.  8. Follow up with PCP in 1 week.  9. Follow up with vascular surgeon Next week.  10. Please let PCP/Specialists know of any changes in medications that were made.  11. Please see medications section of this packet for any medication changes.   DOCTOR'S APPOINTMENTS & FOLLOW UP Future Appointments  Date Time Provider [place]  04/15/2021  2:00 PM [place] VASC US 5 [facility]  04/20/2021  3:30 PM IMP-IMCR FINANCIAL COUNSELOR IMP-IMCR [place]  04/29/2021  9:00 AM Hoffman, Elza Rafter, DO [facility]     Thank you for choosing [facility]! Take care and be well!  Internal [facility]  [address] [place], [place] [address] [phone]  Signed: Armando Reichert, MD 04/15/2021, 5:12 PM   Pager: [phone]

## 2021-04-15 NOTE — Progress Notes (Signed)
ABI's have been completed. Preliminary results can be found in CV Proc through chart review.   04/15/21 2:08 PM [name] RVT

## 2021-04-15 NOTE — Progress Notes (Signed)
Internal Medicine Clinic Attending  I saw and evaluated the patient.  I personally confirmed the key portions of the history and exam documented by Dr. Sherry Ruffing and I reviewed pertinent patient test results.  The assessment, diagnosis, and plan were formulated together and I agree with the documentation in the resident's note.   Person living with uncontrolled diabetes here with one week of worsening discoloration of the right great toe. She has struggled with a callous on this toe for a long time, she does have neuropathy in that foot. She now has worsening dark discoloration on the top and lateral aspect of the great toe. No purulence. I cannot tell if there is an ulcer because there is a large callous on the distal toe. She has bounding DP pulse, so I suspect this is microvascular disease. She is at risk for osteomyelitis of the toe as well. She is uninsured and without enough resources to access outpatient surgical consultation or imaging. She has already had amputations of toes on the left foot, and is at high risk for amputations on the right. We will admit the patient to our service. I recommend MRI of the right foot, arterial doppler studies of the right leg, and podiatry consultation as I anticipate this toe will require amputation.

## 2021-04-15 NOTE — Progress Notes (Incomplete)
Subjective: No O/N events.   Objective: Pt is seen sitting at the chair comfortably. Denies any pain in the Rt roe. States she feels mild numbness in the toe but no tingling. Pt states this is not her first time getting infection or amputation. She has had ulcers, infection  multiple times but this time she doesn't feel that it is infected. She feels that its getting better and she can follow up with wound care and foot doctor outpatient. States she would like to go home soon but agrees to follow medical team advise.   Vital signs in last 24 hours: Vitals:   04/14/21 1740 04/14/21 2137 04/15/21 0432  BP: (!) 142/94 137/84 (!) 124/52  Pulse: 80 76 72  Resp: _0 Temp: 98.9 F (37.2 C) 98 F (36.7 C) 97.8 F (36.6 C)  TempSrc: Oral Oral Oral  SpO2: 100% 100% 99%    CBC Latest Ref Rng & Units 04/15/2021 04/14/2021 02/25/2020  WBC 4.0 - 10.5 K/uL 5.6 8.0 7.7  Hemoglobin 12.0 - 15.0 g/dL 11.5(L) 12.5 12.5  Hematocrit 36.0 - 46.0 % 36.8 40.1 40.7  Platelets 150 - 400 K/uL 231 269 216   CMP Latest Ref Rng & Units 04/15/2021 04/14/2021 04/14/2021  Glucose 70 - 99 mg/dL 171(H) 158(H) 74  BUN 6 - 20 mg/dL _1 Creatinine 0.44 - 1.00 mg/dL 0.67 0.83 0.61  Sodium 135 - 145 mmol/L 141 141 142  Potassium 3.5 - 5.1 mmol/L 3.9 4.3 4.2  Chloride 98 - 111 mmol/L 108 107 106  CO2 22 - 32 mmol/L _2 Calcium 8.9 - 10.3 mg/dL 8.8(L) 9.1 9.3  Total Protein 6.5 - 8.1 g/dL - 6.7 -  Total Bilirubin 0.3 - 1.2 mg/dL - 0.8 -  Alkaline Phos 38 - 126 U/L - 89 -  AST 15 - 41 U/L - 28 -  ALT 0 - 44 U/L - 34 -    Physical Exam Constitutional: Well groomed women sitting on the chair, not in acute distress.  Head: atraumatic Pulmonary: effort normal Skin: warm and dry Big Ulcer present at Rt Big toe with callus. No fluid coming out. Big callus present on the base of Rt foot. Ulcer is mildly tender to touch. Doesn't feel cold. Mild discoloration of Rt foot toes. Dorsalis pedes pulse present. Big  toe Nail dystrophic.  Neurological: alert, and oriented. Psychiatric: normal mood and affect  Assessment/Plan: [name] is a 60 y.o. 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 Dorsalis pedes pulse present. H/O DM with peripheral vascular disease.  BMP wnl except Glucose 171. Renal function normal. CRP elevated to >1. ABI normal. MRI shows early signs of osteomyelitis. ESR normal. Talked to Dr. Lillie Columbia who  recommended to send Patient home on antibiotics with follow up on outpatient with Dr. Sharol Given next week as he is on vacation.  -Start antibiotics. #Hypertension BP 124/52 mmHg in the morning. Pt's BP  stable.  -Continue home antihypertensives (hydrochlorothiazide 12.5 mg daily and lisinopril 5 mg daily)  #Insulin-dependent diabetes mellitus BG - 171 this morning. Pt has been off medications for a while. Last A1C 9.6% (04/14/21) -Continue Lantus 35 units nightly -Moderate SI -Hold Farxiga and metformin  #Diabetic neuropathy Denies any pain. -Continue gabapentin  #Hyperlipidemia -Continue Lipitor  #GERD -Continue Protonix 40 mg daily  Diet:  Carb modified IVF:  None VTE:  Heparin 5000 units Prior to Admission Living Arrangement:  Home Anticipated Discharge Location:  Home Barriers to Discharge:  Medical work up  Dispo: Anticipated discharge in approximately 1-2  day(s).   Armando Reichert, MD 04/15/2021, 5:59 AM Pager: [phone] After 5pm on weekdays and 1pm on weekends: On Call pager [phone]

## 2021-04-15 NOTE — Progress Notes (Signed)
Discharge summary packet provided to pt with instructions. Pt verbalized understanding of instructions. All questions and concerns were fully answered. No complaints. Pt D/C to home as ordered. Daughter will be responsible for pt's transport.

## 2021-04-15 NOTE — Discharge Instructions (Signed)
Dear [name],   Thank you for letting us participate in your care! In this section, you will find a brief hospital admission summary of why you were admitted to the hospital, what happened during your admission, your diagnosis/diagnoses, and recommended follow up.   You were admitted because you were experiencing Foot ulcer.   Your testing revealed concerns of osteomyletis.   You were diagnosed with osteomylelitis.  You were treated with wound care and discharged on antibiotics.   Your condition remain stable and you were discharged from the hospital for meeting this goal.   POST-HOSPITAL & CARE INSTRUCTIONS 1. Follow up with wound care clinic.  2. Follow up with vascular surgeon Next week.  3. Please let PCP/Specialists know of any changes in medications that were made.  4. Please see medications section of this packet for any medication changes.   DOCTOR'S APPOINTMENTS & FOLLOW UP Future Appointments  Date Time Provider [place]  04/15/2021  2:00 PM [place] VASC US 5 [facility]  04/20/2021  3:30 PM IMP-IMCR FINANCIAL COUNSELOR IMP-IMCR [place]  04/29/2021  9:00 AM Hoffman, Elza Rafter, DO [facility]     Thank you for choosing [facility]! Take care and be well!  Internal [facility]  [address] [place], [place] [address] [phone]

## 2021-04-16 ENCOUNTER — Other Ambulatory Visit (HOSPITAL_COMMUNITY): Payer: Self-pay

## 2021-04-20 ENCOUNTER — Telehealth: Payer: Self-pay | Admitting: Student

## 2021-04-20 ENCOUNTER — Ambulatory Visit: Payer: Self-pay

## 2021-04-20 DIAGNOSIS — I96 Gangrene, not elsewhere classified: Secondary | ICD-10-CM

## 2021-04-20 NOTE — Telephone Encounter (Signed)
TOC HFU 04/29/2021 at 2:15 pm with Dr. Cato Mulligan.

## 2021-04-22 ENCOUNTER — Ambulatory Visit: Payer: Self-pay

## 2021-04-22 ENCOUNTER — Other Ambulatory Visit (HOSPITAL_COMMUNITY): Payer: Self-pay

## 2021-04-22 MED ORDER — HYDROCHLOROTHIAZIDE 25 MG PO TABS
25.0000 mg | ORAL_TABLET | Freq: Every day | ORAL | 1 refills | Status: DC
  Filled 2021-04-22 – 2021-05-03 (×2): qty 30, 30d supply, fill #0
  Filled 2021-05-24: qty 30, 30d supply, fill #1

## 2021-04-22 MED ORDER — CANAGLIFLOZIN 300 MG PO TABS
300.0000 mg | ORAL_TABLET | Freq: Every day | ORAL | 2 refills | Status: DC
  Filled 2021-04-22: qty 30, 30d supply, fill #0

## 2021-04-22 NOTE — Telephone Encounter (Signed)
Transition Care Management Follow-up Telephone Call Date of discharge and from where: 04/15/21 [facility] How have you been since you were released from the hospital? "Ok, diarrhea from the antibiotics. Very little drainage from toe wound." Any questions or concerns? Yes, My root foot and leg are swelling." Has not been taking HCTZ as it was sent to [facility] and she needed to re-enroll. Requesting it be sent to [facility] under IM program. 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 She stands the whole time, does wear compression stocking. Has not been elevating leg in evening. Also, eats a lot of soup. Advised on salt content of most soups and to aim for low sodium options.  Items Reviewed: Did the pt receive and understand the discharge instructions provided? Yes  Medications obtained and verified? Yes taking Cipro BID x 28 days Other? No  Any new allergies since your discharge? No  Dietary orders reviewed? Yes, low sodium, heart healthy Do you have support at home? Yes son lives across the street and daughter lives 2 blocks away.  Home Care and Equipment/Supplies: Were home health services ordered? no If so, what is the name of the agency?   Has the agency set up a time to come to the patient's home? not applicable Were any new equipment or medical supplies ordered?  No What is the name of the medical supply agency?  Were you able to get the supplies/equipment? not applicable Do you have any questions related to the use of the equipment or supplies?  Functional Questionnaire: (I = Independent and D = Dependent) ADLs: I  Bathing/Dressing- I  Meal Prep- I  Eating- I  Maintaining continence- I  Transferring/Ambulation- I  Managing Meds- I  Follow up appointments reviewed:  PCP Hospital f/u appt confirmed? Yes  Scheduled to see Dr. Wynetta Emery on 04/29/21 @ 2:15. [facility] f/u appt confirmed? Yes  Scheduled to see Dr. Kalman Shan on 04/29/21 @ 0900. Also, needs referral  to orthopedic surgeon as she does not want to see Dr. Sharol Given Are transportation arrangements needed? No  If their condition worsens, is the pt aware to call PCP or go to the Emergency Dept.? Yes Was the patient provided with contact information for the PCP's office or ED? Yes. Also, given main hospital number [phone]) for after hours with instructions to ask for [facility] Resident on call. Was to pt encouraged to call back with questions or concerns? Yes  L. Danni Shima, BSN, RN-BC

## 2021-04-22 NOTE — Telephone Encounter (Signed)
Patient notified that HCTZ has been sent to [facility] under the IM Program. Anastasio Auerbach has been cancelled with Jasmine at [facility] per Dr. Evette Doffing. Patient was made aware that referral has been placed to Podiatry. States she was told in past that she would be referred to Podiatry and it never occurred. Explained importance of bringing documents to Financial Counselor so that she can receive the [facility] discount and/or [place] letter. This may be why referral was stalled in past if patient was unable to pay out of pocket. States she will bring paperwork next week as she still needs a letter of support from her daughter.

## 2021-04-22 NOTE — Telephone Encounter (Signed)
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 Decision was made to treat medically with oral antibiotics and have her follow up with Dr. Sharol Given as an outpatient. Patient tells [name] now that she is not in agreement with that, that she wants a different opinion. She is noticing more swelling in the right foot and lower leg, which makes me wonder about inflammation related to the osteomyelitis. She is still functional at home, no systemic symptoms of infection. I will place a referral to podiatry, but she may struggle to access their outpatient services due to uninsured status. She can continue the oral antibiotic, but given the condition it seems like a temporizing measure. Will hold off on starting an SGLT2 inhibitor given some increased risk of amputations when initiating that medication. Patient has eventual follow up next week with [facility] and the wound center, we will continue to do our best, but this course appears high risk for readmission.

## 2021-04-29 ENCOUNTER — Encounter (HOSPITAL_BASED_OUTPATIENT_CLINIC_OR_DEPARTMENT_OTHER): Payer: Medicaid Other | Attending: Internal Medicine | Admitting: Internal Medicine

## 2021-04-29 ENCOUNTER — Other Ambulatory Visit: Payer: Self-pay

## 2021-04-29 ENCOUNTER — Encounter: Payer: Self-pay | Admitting: Student

## 2021-04-29 ENCOUNTER — Ambulatory Visit (INDEPENDENT_AMBULATORY_CARE_PROVIDER_SITE_OTHER): Payer: Medicaid Other | Admitting: Student

## 2021-04-29 ENCOUNTER — Other Ambulatory Visit (HOSPITAL_COMMUNITY): Payer: Self-pay

## 2021-04-29 VITALS — BP 130/57 | HR 94 | Temp 98.9°F | Wt 301.0 lb

## 2021-04-29 DIAGNOSIS — L97519 Non-pressure chronic ulcer of other part of right foot with unspecified severity: Secondary | ICD-10-CM | POA: Insufficient documentation

## 2021-04-29 DIAGNOSIS — M86171 Other acute osteomyelitis, right ankle and foot: Secondary | ICD-10-CM | POA: Diagnosis not present

## 2021-04-29 DIAGNOSIS — E11621 Type 2 diabetes mellitus with foot ulcer: Secondary | ICD-10-CM | POA: Insufficient documentation

## 2021-04-29 DIAGNOSIS — Z794 Long term (current) use of insulin: Secondary | ICD-10-CM

## 2021-04-29 DIAGNOSIS — E1142 Type 2 diabetes mellitus with diabetic polyneuropathy: Secondary | ICD-10-CM | POA: Diagnosis not present

## 2021-04-29 DIAGNOSIS — E1165 Type 2 diabetes mellitus with hyperglycemia: Secondary | ICD-10-CM

## 2021-04-29 DIAGNOSIS — I96 Gangrene, not elsewhere classified: Secondary | ICD-10-CM | POA: Diagnosis present

## 2021-04-29 DIAGNOSIS — E1151 Type 2 diabetes mellitus with diabetic peripheral angiopathy without gangrene: Secondary | ICD-10-CM | POA: Diagnosis not present

## 2021-04-29 MED ORDER — INSULIN GLARGINE 100 UNIT/ML SOLOSTAR PEN
70.0000 [IU] | PEN_INJECTOR | Freq: Every day | SUBCUTANEOUS | 3 refills | Status: DC
  Filled 2021-04-29: qty 15, 21d supply, fill #0
  Filled 2021-05-24: qty 15, 21d supply, fill #1

## 2021-04-29 MED ORDER — INSULIN LISPRO (1 UNIT DIAL) 100 UNIT/ML (KWIKPEN)
18.0000 [IU] | PEN_INJECTOR | Freq: Three times a day (TID) | SUBCUTANEOUS | 3 refills | Status: DC
  Filled 2021-04-29: qty 15, 28d supply, fill #0

## 2021-04-29 MED ORDER — INSULIN GLARGINE 100 UNIT/ML SOLOSTAR PEN
70.0000 [IU] | PEN_INJECTOR | Freq: Every day | SUBCUTANEOUS | 3 refills | Status: DC
  Filled 2021-04-29: qty 15, 21d supply, fill #0

## 2021-04-29 MED ORDER — INSULIN LISPRO (1 UNIT DIAL) 100 UNIT/ML (KWIKPEN)
18.0000 [IU] | PEN_INJECTOR | Freq: Three times a day (TID) | SUBCUTANEOUS | 3 refills | Status: DC
  Filled 2021-04-29: qty 15, 28d supply, fill #0
  Filled 2021-05-24: qty 15, 28d supply, fill #1
  Filled 2021-07-05: qty 15, 28d supply, fill #2
  Filled 2021-07-30: qty 15, 28d supply, fill #3

## 2021-04-29 NOTE — Progress Notes (Signed)
NAVI, EWTON ([id number]) Visit Report for 04/29/2021 Chief Complaint Document Details Patient Name: Date of Service: [name], [name] 04/29/2021 9:00 [place] Record Rivera: [id number] Patient Account Rivera: [id number] Date of Birth/Sex: Treating RN: 1961-08-11 (60 y.o. [name] Primary Care Provider: Leatha Gilding, [place] TTHEW Other Clinician: Referring Provider: Treating Provider/Extender: Haywood Pao, MA TTHEW Weeks in Treatment: 0 Information Obtained from: Patient Chief Complaint wound to the distal portion of the right great toe Electronic Signature(s) Signed: 04/29/2021 10:56:48 AM By: Kalman Shan DO Entered By: Kalman Shan on 04/29/2021 10:20:38 -------------------------------------------------------------------------------- Debridement Details Patient Name: Date of Service: [name], [place] 04/29/2021 9:00 [place] Record Rivera: [id number] Patient Account Rivera: [id number] Date of Birth/Sex: Treating RN: 08-Oct-1961 (60 y.o. [name], [name] Primary Care Provider: Leatha Gilding, [place] TTHEW Other Clinician: Referring Provider: Treating Provider/Extender: Haywood Pao, MA TTHEW Weeks in Treatment: 0 Debridement Performed for Assessment: Wound #13 Right,Posterior T Great oe Performed By: Physician Kalman Shan, DO Debridement Type: Debridement Severity of Tissue Pre Debridement: Fat layer exposed Level of Consciousness (Pre-procedure): Awake and Alert Pre-procedure Verification/Time Out Yes - 09:48 Taken: Start Time: 09:48 Pain Control: Lidocaine T Area Debrided (L x W): otal 0.3 (cm) x 0.2 (cm) = 0.06 (cm) Tissue and other material debrided: Viable, Non-Viable, Callus, Subcutaneous, Skin: Dermis , Skin: Epidermis Level: Skin/Subcutaneous Tissue Debridement Description: Excisional Instrument: Curette Bleeding: Minimum Hemostasis Achieved: Pressure End Time: 09:49 Procedural Pain: 0 Post Procedural Pain: 0 Response  to Treatment: Procedure was tolerated well Level of Consciousness (Post- Awake and Alert procedure): Post Debridement Measurements of Total Wound Length: (cm) 0.3 Width: (cm) 0.2 Depth: (cm) 0.3 Volume: (cm) 0.014 Character of Wound/Ulcer Post Debridement: Improved Severity of Tissue Post Debridement: Fat layer exposed Post Procedure Diagnosis Same as Pre-procedure Electronic Signature(s) Signed: 04/29/2021 10:56:48 AM By: Kalman Shan DO Signed: 04/29/2021 12:00:56 PM By: Rhae Hammock RN Entered By: Rhae Hammock on 04/29/2021 09:49:16 -------------------------------------------------------------------------------- HPI Details Patient Name: Date of Service: [name], [place] 04/29/2021 9:00 [name]: [id number] Patient Account Rivera: [id number] Date of Birth/Sex: Treating RN: [birth year] (60 y.o. Helene Shoe, Meta.Reding Primary Care Provider: Leatha Gilding, [place] TTHEW Other Clinician: Referring Provider: Treating Provider/Extender: Haywood Pao, MA TTHEW Weeks in Treatment: 0 History of Present Illness Location: notice some discharge and callus on the right big toe Quality: Patient reports experiencing a dull pain to affected area(s). Severity: Patient states wound(s) are getting worse. Duration: Patient has had the wound for < 2 weeks prior to presenting for treatment Timing: Pain in wound is Intermittent (comes and goes Context: The wound occurred when the patient was cutting her toenails and noticed some discharge from this area and was very concerned about it. Modifying Factors: Patient wound(s)/ulcer(s) are improving due to:he has seen her PCP who put her on Bactrim and Keflex antibiotic ssociated Signs and Symptoms: Patient reports having decrease discharge she started on her antibiotic. A HPI Description: [name] is a 60 year old female with a past medical history of uncontrolled type 2 diabetes on insulin, osteomyelitis  with amputations of 1st and 2nd toes of her left foot that presents to clinic for wound care to the right great toe wound. She states she noticed the wound 3 weeks ago. Not quite sure how it started. She has been using silver alginate to the area. She was recently hospitalized from a clinic visit in the internal medicine clinic for this  issue. While hospitalized she had an MRI of the right foot that showed likely early acute osteomyelitis with possible septic arthritis. She was discharged on ciprofloxacin for 28 days. She currently does not deny pain increased warmth or erythema to the area. She has some drainage but not purulent. Electronic Signature(s) Signed: 04/29/2021 10:56:48 AM By: Kalman Shan DO Entered By: Kalman Shan on 04/29/2021 10:32:11 -------------------------------------------------------------------------------- Physical Exam Details Patient Name: Date of Service: [name] 04/29/2021 9:00 A M Medical Record Rivera: [id number] Patient Account Rivera: [id number] Date of Birth/Sex: Treating RN: 05-May-1961 (60 y.o. Helene Shoe, Meta.Reding Primary Care Provider: Leatha Gilding, [place] TTHEW Other Clinician: Referring Provider: Treating Provider/Extender: Haywood Pao, MA TTHEW Weeks in Treatment: 0 Constitutional respirations regular, non-labored and within target range for patient.. Cardiovascular 2+ dorsalis pedis/posterior tibialis pulses. Psychiatric pleasant and cooperative. Electronic Signature(s) Signed: 04/29/2021 10:56:48 AM By: Kalman Shan DO Entered By: Kalman Shan on 04/29/2021 10:32:26 -------------------------------------------------------------------------------- Physician Orders Details Patient Name: Date of Service: [name], [place] 04/29/2021 9:00 [place] Record Rivera: [id number] Patient Account Rivera: [id number] Date of Birth/Sex: Treating RN: 08-Jan-1961 (60 y.o. 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 Bathing/ Shower/ Hygiene May shower with protection but do not get wound dressing(s) wet. - May use cast wrap for protection Edema Control - Lymphedema / SCD / Other Elevate legs to the level of the heart or above for 30 minutes daily and/or when sitting, a frequency of: Avoid standing for long periods of time. Off-Loading Wedge shoe to: - front offloading shoe Wound Treatment Wound #13 - T Great oe Wound Laterality: Right, Posterior Cleanser: Wound Cleanser (DME) (Generic) Every Other Day/15 Days Discharge Instructions: Cleanse the wound with wound cleanser prior to applying a clean dressing using gauze sponges, not tissue or cotton balls. Prim Dressing: KerraCel Ag Gelling Fiber Dressing, 4x5 in (silver alginate) (DME) (Generic) Every Other Day/15 Days ary Discharge Instructions: Apply silver alginate to wound bed as instructed Secondary Dressing: Woven Gauze Sponge, Non-Sterile 4x4 in (DME) (Generic) Every Other Day/15 Days Discharge Instructions: Apply over primary dressing as directed. Secondary Dressing: Optifoam Non-Adhesive Dressing, 4x4 in (DME) (Generic) Every Other Day/15 Days Discharge Instructions: Apply over primary dressing as directed. Secured With: Child psychotherapist, Sterile 2x75 (in/in) (DME) (Generic) Every Other Day/15 Days Discharge Instructions: Secure with stretch gauze as directed. Secured With: 1M Medipore H Soft Cloth Surgical Tape, 2x2 (in/yd) (DME) (Generic) Every Other Day/15 Days Discharge  Instructions: Secure dressing with tape as directed. Consults Infectious Disease - Refer for new osteomyelitis diagnosis and diabetic ulcer to Right great toe Electronic Signature(s) Signed: 04/29/2021 10:56:48 AM By: Kalman Shan DO Entered By: Kalman Shan on 04/29/2021 10:55:18 Prescription 04/29/2021 -------------------------------------------------------------------------------- Imogene Burn DO Patient Name: Provider: [birth year] [id number] Date of Birth: NPI#Corliss Marcus Sex: DEA #: [phone] Phone #: License #: [place] Patient Address: [address] [place], [place] [address] [place], [place] [address] [phone] Allergies ibuprofen; aspirin; penicillin Provider's Orders Infectious Disease -  Refer for new osteomyelitis diagnosis and diabetic ulcer to Right great toe Hand Signature: Date(s): Electronic Signature(s) Signed: 04/29/2021 10:56:48 AM By: Kalman Shan DO Entered By: Kalman Shan on 04/29/2021 10:55:18 -------------------------------------------------------------------------------- Problem List Details Patient Name: Date of Service: [name], [place] 04/29/2021 9:00 A M Medical Record Rivera: [id number] Patient Account Rivera: [id number] Date of Birth/Sex: Treating RN: [birth year] (60 y.o. 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 Helene Shoe, Meta.Reding Primary Care Provider: Leatha Gilding, [place] TTHEW Other Clinician: Referring Provider: Treating Provider/Extender: Haywood Pao, MA TTHEW Weeks in Treatment: 0 Subjective Chief Complaint Information obtained from Patient wound to the distal portion of the right great toe History of Present Illness (HPI) The following HPI elements were documented for the patient's wound: Location: notice some discharge and callus on the right big toe Quality: Patient reports experiencing a dull pain to affected area(s). Severity: Patient states wound(s) are getting worse. Duration: Patient has had the wound for < 2 weeks prior to presenting for treatment Timing: Pain in wound is Intermittent (comes and goes Context: The wound occurred when the patient was cutting her toenails and noticed some discharge from this area and was very concerned about it. Modifying Factors: Patient wound(s)/ulcer(s) are improving due to:he has seen her PCP who put her on Bactrim and Keflex antibiotic Associated Signs and Symptoms: Patient reports having decrease discharge she started on her antibiotic. Ms. Karter Haire is a 60 year old female with a past medical history of uncontrolled type 2 diabetes on insulin, osteomyelitis with amputations of 1st and 2nd toes of her left foot that presents to clinic for wound care to the right great toe wound. She states she noticed the wound 3 weeks ago. Not quite sure how it started. She has been  using silver alginate to the area. She was recently hospitalized from a clinic visit in the internal medicine clinic for this issue. While hospitalized she had an MRI of the right foot that showed likely early acute osteomyelitis with possible septic arthritis. She was discharged on ciprofloxacin for 28 days. She currently does not deny pain increased warmth or erythema to the area. She has some drainage but not purulent. Patient History Information obtained from Patient. Allergies penicillin (Severity: Mild, Reaction: unknown), ibuprofen (Severity: Moderate, Reaction: vomiting), aspirin (Severity: Moderate, Reaction: nausea/vomiting) Family History Diabetes - Father,Child, Hypertension - Mother,Father, No family history of Cancer, Heart Disease, Hereditary Spherocytosis, Kidney Disease, Lung Disease, Seizures, Stroke, Thyroid Problems, Tuberculosis. Social History Never smoker, Marital Status - Divorced, Alcohol Use - Never, Drug Use - No History, Caffeine Use - Rarely - soda. Medical History Eyes Denies history of Cataracts, Glaucoma, Optic Neuritis Ear/Nose/Mouth/Throat Denies history of Chronic sinus problems/congestion, Middle ear problems  Hematologic/Lymphatic Patient has history of Anemia Denies history of Hemophilia, Human Immunodeficiency Virus, Lymphedema, Sickle Cell Disease Respiratory Patient has history of Asthma Denies history of Aspiration, Chronic Obstructive Pulmonary Disease (COPD), Pneumothorax, Sleep Apnea, Tuberculosis Cardiovascular Patient has history of Hypertension Denies history of Angina, Arrhythmia, Congestive Heart Failure, Coronary Artery Disease, Deep Vein Thrombosis, Hypotension, Myocardial Infarction, Peripheral Arterial Disease, Peripheral Venous Disease, Phlebitis, Vasculitis Gastrointestinal Denies history of Cirrhosis , Colitis, Crohnoos, Hepatitis A, Hepatitis B, Hepatitis C Endocrine Patient has history of Type II Diabetes Denies history of  Type I Diabetes Genitourinary Denies history of End Stage Renal Disease Immunological Denies history of Lupus Erythematosus, Raynaudoos, Scleroderma Integumentary (Skin) Denies history of History of Burn Musculoskeletal Patient has history of Osteomyelitis - hx left great toe Denies history of Gout, Rheumatoid Arthritis, Osteoarthritis Neurologic Patient has history of Neuropathy Denies history of Dementia, Quadriplegia, Paraplegia, Seizure Disorder Oncologic Denies history of Received Chemotherapy, Received Radiation Psychiatric Patient has history of Confinement Anxiety Denies history of Anorexia/bulimia Patient is treated with Insulin, Oral Agents. Blood sugar is tested. Blood sugar results noted at the following times: Lunch - 120-160. Hospitalization/Surgery History - left second toe amputation. - left great toe amputation. - c-section x4. Medical A Surgical History Notes nd Gastrointestinal GERD Review of Systems (ROS) Integumentary (Skin) Complains or has symptoms of Wounds - Right Great Toe. Objective Constitutional respirations regular, non-labored and within target range for patient.. Vitals Time Taken: 9:08 AM, Temperature: 98.5 F, Pulse: 102 bpm, Respiratory Rate: 18 breaths/min, Blood Pressure: 117/61 mmHg, Capillary Blood Glucose: 110 mg/dl. General Notes: Glucose per patient report Cardiovascular 2+ dorsalis pedis/posterior tibialis pulses. Psychiatric pleasant and cooperative. Integumentary (Hair, Skin) Wound #13 status is Open. Original cause of wound was Gradually Appeared. The date acquired was: 04/05/2021. The wound is located on the Right,[employer]osterior [employer] The wound measures 0.3cm length x 0.2cm width x 0.3cm depth; 0.047cm^2 area and 0.014cm^3 volume. There is Fat Layer (Subcutaneous Tissue) exposed. There is no tunneling or undermining noted. There is a medium amount of sanguinous drainage noted. The wound margin is well defined and not attached to  the wound base. There is large (67-100%) red granulation within the wound bed. There is no necrotic tissue within the wound bed. 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 Rubano presents for a nonhealing ulcer to her right great toe that she noticed 3 weeks ago. She had an MRI that reported early acute osteomyelitis with possible septic arthritis. She was started on ciprofloxacin for 4 weeks. I will refer to infectious disease for discussion of antibiotics. She will likely need anaerobic and better gram-positive coverage. At this time she has no symptoms of infection. Her ABIs were normal. I debrided some nonviable tissue today and I will continue with silver alginate. She can follow-up in 1 week. Procedures Wound #13 Pre-procedure diagnosis of Wound #13 is a Diabetic Wound/Ulcer of the Lower Extremity located on the Centre .Severity of Tissue Pre oe Debridement is: Fat layer exposed. There was a Excisional Skin/Subcutaneous Tissue Debridement with a total area of 0.06 sq cm performed by Kalman Shan, DO. With the following instrument(s): Curette to remove Viable and Non-Viable tissue/material. Material removed includes Callus, Subcutaneous Tissue, Skin: Dermis, and Skin: Epidermis after achieving pain control using Lidocaine. No specimens were taken. A time  out was conducted at 09:48, prior to the start of the procedure. A Minimum amount of bleeding was controlled with Pressure. The procedure was tolerated well with a pain level of 0 throughout and a pain level of 0 following the procedure. Post Debridement Measurements: 0.3cm length x 0.2cm width x 0.3cm depth; 0.014cm^3 volume. Character of Wound/Ulcer Post Debridement is  improved. Severity of Tissue Post Debridement is: Fat layer exposed. Post procedure Diagnosis Wound #13: Same as Pre-Procedure Plan Follow-up Appointments: Return Appointment in 1 week. Bathing/ Shower/ Hygiene: May shower with protection but do not get wound dressing(s) wet. - May use cast wrap for protection Edema Control - Lymphedema / SCD / Other: Elevate legs to the level of the heart or above for 30 minutes daily and/or when sitting, a frequency of: Avoid standing for long periods of time. Off-Loading: Wedge shoe to: - front offloading shoe Consults ordered were: Infectious Disease - Refer for new osteomyelitis diagnosis and diabetic ulcer to Right great toe WOUND #13: - T Great Wound Laterality: Right, Posterior oe Cleanser: Wound Cleanser (DME) (Generic) Every Other Day/15 Days Discharge Instructions: Cleanse the wound with wound cleanser prior to applying a clean dressing using gauze sponges, not tissue or cotton balls. Prim Dressing: KerraCel Ag Gelling Fiber Dressing, 4x5 in (silver alginate) (DME) (Generic) Every Other Day/15 Days ary Discharge Instructions: Apply silver alginate to wound bed as instructed Secondary Dressing: Woven Gauze Sponge, Non-Sterile 4x4 in (DME) (Generic) Every Other Day/15 Days Discharge Instructions: Apply over primary dressing as directed. Secondary Dressing: Optifoam Non-Adhesive Dressing, 4x4 in (DME) (Generic) Every Other Day/15 Days Discharge Instructions: Apply over primary dressing as directed. Secured With: Child psychotherapist, Sterile 2x75 (in/in) (DME) (Generic) Every Other Day/15 Days Discharge Instructions: Secure with stretch gauze as directed. Secured With: 48M Medipore H Soft Cloth Surgical T ape, 2x2 (in/yd) (DME) (Generic) Every Other Day/15 Days Discharge Instructions: Secure dressing with tape as directed. 1. Silver alginate 2. In office sharp debridement 3. Follow-up in 1 week 4. Referral to infectious  disease Electronic Signature(s) Signed: 04/29/2021 10:56:48 AM By: Kalman Shan DO Entered By: Kalman Shan on 04/29/2021 10:55:45 -------------------------------------------------------------------------------- HxROS Details Patient Name: Date of Service: [name], [place] 04/29/2021 9:00 A M Medical Record Rivera: [id number] Patient Account Rivera: [id number] Date of Birth/Sex: Treating RN: [birth year] (60 y.o. [name] Primary Care Provider: Leatha Gilding, [place] TTHEW Other Clinician: Referring Provider: Treating Provider/Extender: Haywood Pao, MA TTHEW Weeks in Treatment: 0 Label Progress Note Print Version as History and Physical for this encounter Information Obtained From Patient Integumentary (Skin) Complaints and Symptoms: Positive for: Wounds - Right Great Toe Medical History: Negative for: History of Burn Eyes Medical History: Negative for: Cataracts; Glaucoma; Optic Neuritis Ear/Nose/Mouth/Throat Medical History: Negative for: Chronic sinus problems/congestion; Middle ear problems Hematologic/Lymphatic Medical History: Positive for: Anemia Negative for: Hemophilia; Human Immunodeficiency Virus; Lymphedema; Sickle Cell Disease Respiratory Medical History: Positive for: Asthma Negative for: Aspiration; Chronic Obstructive Pulmonary Disease (COPD); Pneumothorax; Sleep Apnea; Tuberculosis Cardiovascular Medical History: Positive for: Hypertension Negative for: Angina; Arrhythmia; Congestive Heart Failure; Coronary Artery Disease; Deep Vein Thrombosis; Hypotension; Myocardial Infarction; Peripheral Arterial Disease; Peripheral Venous Disease; Phlebitis; Vasculitis Gastrointestinal Medical History: Negative for: Cirrhosis ; Colitis; Crohns; Hepatitis A; Hepatitis B; Hepatitis C Past Medical History Notes: GERD Endocrine Medical History: Positive for: Type II Diabetes Negative for: Type I Diabetes Time with diabetes: 50 years Treated with:  Insulin, Oral agents Blood sugar tested every day: Yes Tested : 2 times  per day Blood sugar testing results: Lunch: 120-160 Genitourinary Medical History: Negative for: End Stage Renal Disease Immunological Medical History: Negative for: Lupus Erythematosus; Raynauds; Scleroderma Musculoskeletal Medical History: Positive for: Osteomyelitis - hx left great toe Negative for: Gout; Rheumatoid Arthritis; Osteoarthritis Neurologic Medical History: Positive for: Neuropathy Negative for: Dementia; Quadriplegia; Paraplegia; Seizure Disorder Oncologic Medical History: Negative for: Received Chemotherapy; Received Radiation Psychiatric Medical History: Positive for: Confinement Anxiety Negative for: Anorexia/bulimia Immunizations Pneumococcal Vaccine: Received Pneumococcal Vaccination: Yes Immunization Notes: pt. 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 c-section x4 Family and Social History Cancer: No; Diabetes: Yes - Father,Child; Heart Disease: No; Hereditary Spherocytosis: No; Hypertension: Yes - Mother,Father; Kidney Disease: No; Lung Disease: No; Seizures: No; Stroke: No; Thyroid Problems: No; Tuberculosis: No; Never smoker; Marital Status - Divorced; Alcohol Use: Never; Drug Use: No History; Caffeine Use: Rarely - soda; Financial Concerns: No; Food, Clothing or Shelter Needs: No; Support System Lacking: No; Transportation Concerns: No Electronic Signature(s) Signed: 04/29/2021 10:56:48 AM By: Kalman Shan DO Signed: 04/29/2021 5:54:05 PM By: Lorrin Jackson Entered By: Lorrin Jackson on 04/29/2021 09:10:11 -------------------------------------------------------------------------------- SuperBill Details Patient Name: Date of Service: GETSEMANI, [place] 04/29/2021 Medical Record Rivera: [id number] Patient Account Rivera: [id number] Date of Birth/Sex: Treating  RN: September 22, 1961 (60 y.o. 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 E11.65 Type 2 diabetes mellitus with hyperglycemia Facility Procedures CPT4 Code: [id number] Description: [place] VISIT-LEV 3 EST PT Modifier: Quantity: 1 CPT4 Code: [id number] Description: 22575 - DEB SUBQ TISSUE 20 SQ CM/< ICD-10 Diagnosis Description 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t Modifier: y Quantity: 1 Physician Procedures : CPT4 Code Description Modifier [id number] - WC PHYS LEVEL 3 - EST PT ICD-10 Diagnosis Description E11.621 Type 2 diabetes mellitus with foot ulcer E11.65 Type 2 diabetes mellitus with hyperglycemia L97.519 Non-pressure chronic ulcer of other part of  right foot with unspecified severity M86.171 Other acute osteomyelitis, right ankle and foot [id number] 11042 - WC PHYS SUBQ TISS 20 SQ CM 1 ICD-10 Diagnosis Description L97.519 Non-pressure chronic ulcer of other part of right foot with unspecified  severity Quantity: 1 Electronic Signature(s) Signed: 04/29/2021 10:56:48 AM By: Kalman Shan DO Entered By: Kalman Shan on 04/29/2021 10:56:07

## 2021-04-29 NOTE — Progress Notes (Signed)
[name], Rivera ([id number]) Visit Report for 04/29/2021 Abuse/Suicide Risk Screen Details Patient Name: Date of Service: [name], [name] 04/29/2021 9:00 [place] Record Number: [id number] Patient Account Number: [id number] Date of Birth/Sex: Treating RN: November 29, 1960 (60 y.o. [name] Primary Care Latavia Goga: Leatha Gilding, [place] TTHEW Other Clinician: Referring Travontae Freiberger: Treating Brandey Vandalen/Extender: Haywood Pao, [place] TTHEW Weeks in Treatment: 0 Abuse/Suicide Risk Screen Items Answer ABUSE RISK SCREEN: Has anyone close to you tried to hurt or harm you recentlyo No Do you feel uncomfortable with anyone in your familyo No Has anyone forced you do things that you didnt want to doo No Electronic Signature(s) Signed: 04/29/2021 5:54:05 PM By: Lorrin Jackson Entered By: Lorrin Jackson on 04/29/2021 09:10:18 -------------------------------------------------------------------------------- Activities of Daily Living Details Patient Name: Date of Service: Debra, Rivera 04/29/2021 9:00 [place] Record Number: [id number] Patient Account Number: [id number] Date of Birth/Sex: Treating RN: 07-12-1961 (60 y.o. [name] Primary Care Geanie Pacifico: Leatha [place], [place] TTHEW Other Clinician: Referring Shainna Faux: Treating Rolan Wrightsman/Extender: Haywood Pao, [place] TTHEW Weeks in Treatment: 0 Activities of Daily Living Items Answer Activities of Daily Living (Please select one for each item) Drive Automobile Completely Able T Medications ake Completely Able Use T elephone Completely Able Care for Appearance Completely Able Use T oilet Completely Able Bath / Shower Completely Able Dress Self Completely Able Feed Self Completely Able Walk Completely Able Get In / Out Bed Completely Able Housework Completely Able Prepare Meals Completely Able Handle Money Completely Able Shop for Self Completely Able Electronic Signature(s) Signed: 04/29/2021 5:54:05 PM By: Lorrin Jackson Entered By: Lorrin Jackson on 04/29/2021 09:10:43 -------------------------------------------------------------------------------- Education Screening Details Patient Name: Date of Service: Debra, Rivera 04/29/2021 9:00 [place] Record Number: [id number] Patient Account Number: [id number] Date of Birth/Sex: Treating RN: August 20, 1961 (60 y.o. [name] Primary Care Emanuele Mcwhirter: Leatha [place], [place] TTHEW Other Clinician: Referring Dorethia Jeanmarie: Treating Seher Schlagel/Extender: Haywood Pao, [place] TTHEW Weeks in Treatment: 0 Primary Learner Assessed: Patient Learning Preferences/Education Level/Primary Language Learning Preference: Explanation, Demonstration, Printed Material Highest Education Level: College or Above Preferred Language: English Cognitive Barrier Language Barrier: No Translator Needed: No Memory Deficit: No Emotional Barrier: No Cultural/Religious Beliefs Affecting Medical Care: No Physical Barrier Impaired Vision: Yes Glasses Impaired Hearing: No Decreased Hand dexterity: No Knowledge/Comprehension Knowledge Level: High Comprehension Level: High Ability to understand written instructions: High Ability to understand verbal instructions: High Motivation Anxiety Level: Calm Cooperation: Cooperative Education Importance: Acknowledges Need Interest in Health Problems: Asks Questions Perception: Coherent Willingness to Engage in Self-Management High Activities: Readiness to Engage in Self-Management High Activities: Electronic Signature(s) Signed: 04/29/2021 5:54:05 PM By: Lorrin Jackson Entered By: Lorrin Jackson on 04/29/2021 09:11:18 -------------------------------------------------------------------------------- Fall Risk Assessment Details Patient Name: Date of Service: [name], [place] 04/29/2021 9:00 [place] Record Number: [id number] Patient Account Number: [id number] Date of Birth/Sex: Treating RN: 03/24/1961 (60 y.o. [name] Primary Care Tayana Shankle: Leatha [place], [place] TTHEW Other Clinician: Referring Lynford Espinoza: Treating Norena Bratton/Extender: Haywood Pao, [place] TTHEW Weeks in Treatment: 0 Fall Risk Assessment Items Have you had 2 or more falls in the last 12 monthso 0 No Have you had any fall that resulted in injury in the last 12 monthso 0 No FALLS RISK SCREEN History of falling - immediate or within 3 months 0 No Secondary diagnosis (Do you have 2 or more medical diagnoseso) 0 No Ambulatory aid None/bed rest/wheelchair/nurse 0 Yes  Crutches/cane/walker 0 No Furniture 0 No Intravenous therapy Access/Saline/Heparin Lock 0 No Gait/Transferring Normal/ bed rest/ wheelchair 0 Yes Weak (short steps with or without shuffle, stooped but able to lift head while walking, may seek 0 No support from furniture) Impaired (short steps with shuffle, may have difficulty arising from chair, head down, impaired 0 No balance) Mental Status Oriented to own ability 0 Yes Electronic Signature(s) Signed: 04/29/2021 5:54:05 PM By: Lorrin Jackson Entered By: Lorrin Jackson on 04/29/2021 09:11:33 -------------------------------------------------------------------------------- Foot Assessment Details Patient Name: Date of Service: [name], [facility] 04/29/2021 9:00 [place] Record Number: [id number] Patient Account Number: [id number] Date of Birth/Sex: Treating RN: 1961-08-18 (60 y.o. [name] Primary Care Ishaq Maffei: Leatha G[place], [place] TTHEW Other Clinician: Referring Jayden Kratochvil: Treating Alean Kromer/Extender: Haywood Pao, [place] TTHEW Weeks in Treatment: 0 Foot Assessment Items Site Locations + = Sensation present, - = Sensation absent, C = Callus, U = Ulcer R = Redness, W = Warmth, M = Maceration, PU = Pre-ulcerative lesion F = Fissure, S = Swelling, D = Dryness Assessment Right: Left: Other Deformity: No No Prior Foot Ulcer: No No Prior Amputation: No No Charcot Joint: No No Ambulatory Status:  Ambulatory Without Help Gait: Steady Electronic Signature(s) Signed: 04/29/2021 5:54:05 PM By: Lorrin Jackson Entered By: Lorrin Jackson on 04/29/2021 09:13:42 -------------------------------------------------------------------------------- Nutrition Risk Screening Details Patient Name: Date of Service: TEODORA, BAUMGARTEN 04/29/2021 9:00 A M Medical Record Number: [id number] Patient Account Number: [id number] Date of Birth/Sex: Treating RN: December 12, 1960 (60 y.o. [name] Primary Care Oluwadamilola Rosamond: Leatha G[place], [place] TTHEW Other Clinician: Referring Brenlee Koskela: Treating Loukas Antonson/Extender: Haywood Pao, MA TTHEW Weeks in Treatment: 0 Height (in): Weight (lbs): Body Mass Index (BMI): Nutrition Risk Screening Items Score Screening NUTRITION RISK SCREEN: I have an illness or condition that made me change the kind and/or amount of food I eat 0 No I eat fewer than two meals per day 0 No I eat few fruits and vegetables, or milk products 0 No I have three or more drinks of beer, liquor or wine almost every day 0 No I have tooth or mouth problems that make it hard for me to eat 0 No I don't always have enough money to buy the food I need 0 No I eat alone most of the time 0 No I take three or more different prescribed or over-the-counter drugs a day 1 Yes Without wanting to, I have lost or gained 10 pounds in the last six months 0 No I am not always physically able to shop, cook and/or feed myself 0 No Nutrition Protocols Good Risk Protocol 0 No interventions needed Moderate Risk Protocol High Risk Proctocol Risk Level: Good Risk Score: 1 Electronic Signature(s) Signed: 04/29/2021 5:54:05 PM By: Lorrin Jackson Entered By: Lorrin Jackson on 04/29/2021 09:11:55

## 2021-04-29 NOTE — Addendum Note (Signed)
Addended by: Paulla Dolly on: 04/29/2021 02:58 PM   Modules accepted: Orders

## 2021-04-29 NOTE — Assessment & Plan Note (Signed)
Patient presents for hospital follow-up for gangrene of right great toe with underlying osteomyelitis. Since discharge, patient reports that her swelling and pain has been improving since discharge. She still has some mild swelling of the dorsal aspect of her foot which worsens with prolonged standing. She states the pain is located along the dorsal aspect of her right great toe and radiates to the dorsum of her foot. She rates the pain as a 3-4 out of 10 in severity. She has been adherent to twice daily ciprofloxacin and was evaluated by wound care clinic this morning and had digit wrapped with medicated dressing which is to remain in place for two days. Patient planning to follow-up with clinic in one week. She is still working on obtaining orange card so that she can obtain referral to podiatry and orthopedic surgery. Objectively, patient is hemodynamically stable and afebrile. No significant erythema, warmth or tenderness of the dorsum of the right foot. Right great toe remains wrapped in medicated dressing.  Overall, patient is a 60 year old person living with morbid obesity and poorly controlled diabetes with gangrene of right great toe and underlying osteomyelitis undergoing outpatient oral antibiotic therapy. Patient reports subjective improvement of pain and swelling and is tolerating oral antibiotic regimen well. -Continue ciprofloxacin twice daily for 28 day course -Continue following with wound care clinic weekly -Obtain orange card to receive referral to podiatry and orthopedic surgery  -Patient is adamant that she not be evaluated by Dr. Sharol Given but had positive experience with Dr. Rush Farmer -Stressed the importance of diabetes control

## 2021-04-29 NOTE — Progress Notes (Signed)
   CC: Hospital follow-up for right great toe osteomyelitis  HPI:  [name] is a 60 y.o. with past medical history of morbid obesity, uncontrolled type 2 diabetes mellitus and recent admission for gangrene of right great toe with acute osteomyelitis who presents to clinic for hospital follow-up. Refer to problem list for charting of this encounter.  Past Medical History:  Diagnosis Date  . Anemia   . Asthma 11/01/2010  . BPPV (benign paroxysmal positional vertigo) 03/07/2013  . Diabetes mellitus    since 1983  . Elevated TSH 05/13/2013  . GERD (gastroesophageal reflux disease)   . Hypertension   . Left elbow pain 05/05/2016  . Left shoulder pain 05/05/2016  . Osteomyelitis ([place])   . Pneumonia    as a child  . Wrist pain 04/11/2013   Review of Systems: Endorses mild pain and swelling in dorsal aspect of right foot and right great toe. Denies fevers, chills, nausea, vomiting, myalgias.  Physical Exam:  Vitals:   04/29/21 1355  Weight: (!) 301 lb (136.5 kg)   Physical Exam Constitutional:      General: She is not in acute distress.    Appearance: She is obese.  Cardiovascular:     Rate and Rhythm: Normal rate and regular rhythm.     Pulses: Normal pulses.     Heart sounds: Normal heart sounds.  Pulmonary:     Effort: Pulmonary effort is normal.     Breath sounds: Normal breath sounds.  Abdominal:     General: Abdomen is flat. Bowel sounds are normal.     Palpations: Abdomen is soft.     Tenderness: There is no abdominal tenderness.  Musculoskeletal:     Comments: Minimal tenderness of dorsal aspect of right foot and right great toe. No swelling of bilateral lower extremities.  Skin:    Comments: Right great toe wrapped in medicated dressing  Neurological:     Mental Status: She is alert.     Assessment & Plan:   See Encounters Tab for problem based charting.  Patient discussed with Dr. Evette Doffing

## 2021-04-29 NOTE — Patient Instructions (Signed)
Ms. Losee,  It was a pleasure meeting you today in clinic.  For your right toe infection: We recommend that you continue taking the ciprofloxacin twice daily as prescribed. Please continue following closely with the wound care clinic and adhering to their recommendations. We will need to have very good control of your blood sugars in the setting of this infection. I have sent refills of your insulin to [facility]. Finally, please continue working with our clinic to obtain Guadalupe Guerra so that you can work on getting podiatry referral.  Sincerely, Dr. Paulla Dolly, MD

## 2021-04-29 NOTE — Assessment & Plan Note (Signed)
Patient continues to have poorly controlled diabetes with last A1c of 9.6. She has been taking metformin 2000mg  every evening, however she ran out of her insulin yesterday. -Recommended metformin 1000mg  twice daily -Refill glargine 70 units daily -Refill humalog 18 units three times daily

## 2021-04-30 ENCOUNTER — Other Ambulatory Visit (HOSPITAL_COMMUNITY): Payer: Self-pay

## 2021-04-30 NOTE — Progress Notes (Signed)
Internal Medicine Clinic Attending  Case discussed with Dr. Johnson  At the time of the visit.  We reviewed the resident's history and exam and pertinent patient test results.  I agree with the assessment, diagnosis, and plan of care documented in the resident's note.  

## 2021-04-30 NOTE — Progress Notes (Signed)
ADALYND, DONAHOE ([id number]) Visit Report for 04/29/2021 Allergy List Details Patient Name: Date of Service: Debra, Rivera 04/29/2021 9:00 [place] Record Number: [id number] Patient Account Number: [id number] Date of Birth/Sex: Treating RN: 03-11-1961 (60 y.o. [name] Primary Care Severin Bou: [name], [place] Debra Other Clinician: Referring [name]: Treating [name]/Extender: Debra Pao, MA Debra Weeks in Treatment: 0 Allergies Active Allergies penicillin Reaction: unknown Severity: Mild ibuprofen Reaction: vomiting Severity: Moderate aspirin Reaction: nausea/vomiting Severity: Moderate Allergy Notes Electronic Signature(s) Signed: 04/29/2021 5:54:05 PM By: Lorrin Jackson Entered By: Lorrin Jackson on 04/29/2021 09:09:23 -------------------------------------------------------------------------------- Arrival Information Details Patient Name: Date of Service: [name], [name] 04/29/2021 9:00 A M Medical Record Number: [id number] Patient Account Number: [id number] Date of Birth/Sex: Treating RN: February 28, 1961 (60 y.o. [name] Primary Care Rey Fors: [name], [place] Debra Other Clinician: Referring Kaybree Williams: Treating Noora Locascio/Extender: Debra Pao, MA Debra Weeks in Treatment: 0 Visit Information Patient Arrived: Ambulatory Arrival Time: 09:06 Transfer Assistance: None Patient Identification Verified: Yes Secondary Verification Process Completed: Yes Patient Requires Transmission-Based Precautions: No Patient Has Alerts: Yes Patient Alerts: ABI R=1.18 ABI L=1.1 History Since Last Visit Added or deleted any medications: No Any new allergies or adverse reactions: No Had a fall or experienced change in activities of daily living that may affect risk of falls: No Signs or symptoms of abuse/neglect since last visito No Hospitalized since last visit: No Implantable device outside of the clinic excluding cellular tissue based  products placed in the center since last visit: No Has Dressing in Place as Prescribed: Yes Pain Present Now: No Electronic Signature(s) Signed: 04/29/2021 5:54:05 PM By: Lorrin Jackson Entered By: Lorrin Jackson on 04/29/2021 09:22:12 -------------------------------------------------------------------------------- Clinic Level of Care Assessment Details Patient Name: Date of Service: Debra, Rivera 04/29/2021 9:00 [place] Record Number: [id number] Patient Account Number: [id number] Date of Birth/Sex: Treating RN: 1961-02-08 (60 y.o. [name], [name] Primary Care Brylen Wagar: [name], [place] Debra Other Clinician: Referring Shere Eisenhart: Treating Chesney Klimaszewski/Extender: Debra Pao, MA Debra Weeks in Treatment: 0 Clinic Level of Care Assessment Items TOOL 1 Quantity Score X- 1 0 Use when EandM and Procedure is performed on INITIAL visit ASSESSMENTS - Nursing Assessment / Reassessment X- 1 20 General Physical Exam (combine w/ comprehensive assessment (listed just below) when performed on new pt. evals) X- 1 25 Comprehensive Assessment (HX, ROS, Risk Assessments, Wounds Hx, etc.) ASSESSMENTS - Wound and Skin Assessment / Reassessment X- 1 10 Dermatologic / Skin Assessment (not related to wound area) ASSESSMENTS - Ostomy and/or Continence Assessment and Care []  - 0 Incontinence Assessment and Management []  - 0 Ostomy Care Assessment and Management (repouching, etc.) PROCESS - Coordination of Care X - Simple Patient / Family Education for ongoing care 1 15 []  - 0 Complex (extensive) Patient / Family Education for ongoing care X- 1 10 Staff obtains Programmer, systems, Records, T Results / Process Orders est []  - 0 Staff telephones HHA, Nursing Homes / Clarify orders / etc []  - 0 Routine Transfer to another Facility (non-emergent condition) []  - 0 Routine Hospital Admission (non-emergent condition) X- 1 15 New Admissions / Biomedical engineer / Ordering NPWT Apligraf,  etc. , []  - 0 Emergency Hospital Admission (emergent condition) PROCESS - Special Needs []  - 0 Pediatric / Minor Patient Management []  - 0 Isolation Patient Management []  - 0 Hearing / Language / Visual special needs []  - 0 Assessment of Community assistance (transportation, D/C planning, etc.) []  -  0 Additional assistance / Altered mentation []  - 0 Support Surface(s) Assessment (bed, cushion, seat, etc.) INTERVENTIONS - Miscellaneous []  - 0 External ear exam []  - 0 Patient Transfer (multiple staff / Civil Service fast streamer / Similar devices) []  - 0 Simple Staple / Suture removal (25 or less) []  - 0 Complex Staple / Suture removal (26 or more) []  - 0 Hypo/Hyperglycemic Management (do not check if billed separately) []  - 0 Ankle / Brachial Index (ABI) - do not check if billed separately Has the patient been seen at the hospital within the last three years: Yes Total Score: 95 Level Of Care: New/Established - Level 3 Electronic Signature(s) Signed: 04/29/2021 12:00:56 PM By: Rhae Hammock RN Entered By: Rhae Hammock on 04/29/2021 09:50:21 -------------------------------------------------------------------------------- Lower Extremity Assessment Details Patient Name: Date of Service: Debra, Rivera 04/29/2021 9:00 [place] Record Number: [id number] Patient Account Number: [id number] Date of Birth/Sex: Treating RN: March 14, 1961 (60 y.o. [name] Primary Care Melainie Krinsky: [name], [place] Debra Other Clinician: Referring Jarelly Rinck: Treating Caitlynn Ju/Extender: Debra Pao, MA Debra Weeks in Treatment: 0 Edema Assessment Assessed: [Left: No] [Right: Yes] Edema: [Left: N] [Right: o] Calf Left: Right: Point of Measurement: From Medial Instep 35 cm Ankle Left: Right: Point of Measurement: From Medial Instep 25 cm Vascular Assessment Pulses: Dorsalis Pedis Palpable: [Right:Yes] Notes 04/15/21 ABI Right=1.18 L=1.1 at [facility] Electronic Signature(s) Signed:  04/29/2021 5:54:05 PM By: Lorrin Jackson Entered By: Lorrin Jackson on 04/29/2021 09:15:27 -------------------------------------------------------------------------------- Multi-Disciplinary Care Plan Details Patient Name: Date of Service: [name], [facility] 04/29/2021 9:00 [place] Record Number: [id number] Patient Account Number: [id number] Date of Birth/Sex: Treating RN: 04-27-1961 (60 y.o. [name], [name] Primary Care Remmy Crass: [name], [place] Debra Other Clinician: Referring Skyra Crichlow: Treating Field Staniszewski/Extender: Debra Pao, MA Debra Weeks in Treatment: 0 Active Inactive Orientation to the Wound Care Program Nursing Diagnoses: Knowledge deficit related to the wound healing center program Goals: Patient/caregiver will verbalize understanding of the Hand Program Date Initiated: 04/29/2021 Target Resolution Date: 06/03/2021 Goal Status: Active Interventions: Provide education on orientation to the wound center Notes: Electronic Signature(s) Signed: 04/29/2021 12:00:56 PM By: Rhae Hammock RN Entered By: Rhae Hammock on 04/29/2021 09:49:36 -------------------------------------------------------------------------------- Pain Assessment Details Patient Name: Date of Service: HANAKO, TIPPING 04/29/2021 9:00 [place] Record Number: [id number] Patient Account Number: [id number] Date of Birth/Sex: Treating RN: [birth year] (60 y.o. [name] Primary Care Sheyli Horwitz: [name], [place] Debra Other Clinician: Referring [name]: Treating [name]/Extender: Debra Pao, MA Debra Weeks in Treatment: 0 Active Problems Location of Pain Severity and Description of Pain Patient Has Paino No Site Locations Pain Management and Medication Current Pain Management: Electronic Signature(s) Signed: 04/29/2021 5:54:05 PM By: Lorrin Jackson Entered By: Lorrin Jackson on 04/29/2021  09:21:32 -------------------------------------------------------------------------------- Patient/Caregiver Education Details Patient Name: Date of Service: [name], [name] 6/2/2022andnbsp9:00 A M Medical Record Number: [id number] Patient Account Number: [id number] Date of Birth/Gender: Treating RN: 03/06/1961 (61 y.o. [name] Primary Care Physician: [name], [place] Debra Other Clinician: Referring Physician: Treating Physician/Extender: Debra Pao, MA Debra Weeks in Treatment: 0 Education Assessment Education Provided To: Patient Education Topics Provided [employer]: o Methods: Explain/Verbal Responses: State content correctly Electronic Signature(s) Signed: 04/29/2021 12:00:56 PM By: Rhae Hammock RN Entered By: Rhae Hammock on 04/29/2021 09:49:44 -------------------------------------------------------------------------------- Wound Assessment Details Patient Name: Date of Service: [name], [place] 04/29/2021 9:00 A M  Medical Record Number: [id number] Patient Account Number: [id number] Date of Birth/Sex: Treating RN: 1961/09/28 (60 y.o. [name] Primary Care Ashdon Gillson: [name], [place] Debra Other Clinician: Referring [name]: Treating [name] Bilyk/Extender: Debra Pao, MA Debra Weeks in Treatment: 0 Wound Status Wound Number: 13 Primary Diabetic Wound/Ulcer of the Lower Extremity Etiology: Wound Location: Right, Posterior T Great oe Wound Open Wounding Event: Gradually Appeared Status: Date Acquired: 04/05/2021 Comorbid Anemia, Asthma, Hypertension, Type II Diabetes, Weeks Of Treatment: 0 History: Osteomyelitis, Neuropathy, Confinement Anxiety Clustered Wound: No Photos Wound Measurements Length: (cm) 0.3 Width: (cm) 0.2 Depth: (cm) 0.3 Area: (cm) 0.047 Volume: (cm) 0.014 % Reduction in Area: 0% % Reduction in Volume: 0% Epithelialization: None Tunneling: No Undermining:  No Wound Description Classification: Grade 1 Wound Margin: Well defined, not attached Exudate Amount: Medium Exudate Type: Sanguinous Exudate Color: red Foul Odor After Cleansing: No Slough/Fibrino No Wound Bed Granulation Amount: Large (67-100%) Exposed Structure Granulation Quality: Red Fascia Exposed: No Necrotic Amount: None Present (0%) Fat Layer (Subcutaneous Tissue) Exposed: Yes Tendon Exposed: No Muscle Exposed: No Joint Exposed: No Bone Exposed: No Assessment Notes Calloused Periwound Electronic Signature(s) Signed: 04/29/2021 5:54:05 PM By: Lorrin Jackson Signed: 04/30/2021 5:48:41 PM By: Sandre Kitty Entered By: Sandre Kitty on 04/29/2021 16:41:54 -------------------------------------------------------------------------------- Vitals Details Patient Name: Date of Service: [name], [place] 04/29/2021 9:00 [place] Record Number: [id number] Patient Account Number: [id number] Date of Birth/Sex: Treating RN: 01/30/1961 (60 y.o. [name] Primary Care Kamari Buch: [name], [place] Debra Other Clinician: Referring Braddock Servellon: Treating Abigayle Wilinski/Extender: Debra Pao, MA Debra Weeks in Treatment: 0 Vital Signs Time Taken: 09:08 Temperature (F): 98.5 Pulse (bpm): 102 Respiratory Rate (breaths/min): 18 Blood Pressure (mmHg): 117/61 Capillary Blood Glucose (mg/dl): 110 Reference Range: 80 - 120 mg / dl Notes Glucose per patient report Electronic Signature(s) Signed: 04/29/2021 5:54:05 PM By: Lorrin Jackson Entered By: Lorrin Jackson on 04/29/2021 09:09:04

## 2021-05-02 ENCOUNTER — Encounter: Payer: Self-pay | Admitting: *Deleted

## 2021-05-03 ENCOUNTER — Other Ambulatory Visit (HOSPITAL_COMMUNITY): Payer: Self-pay

## 2021-05-05 ENCOUNTER — Ambulatory Visit: Payer: Self-pay | Admitting: Internal Medicine

## 2021-05-06 ENCOUNTER — Other Ambulatory Visit: Payer: Self-pay

## 2021-05-06 ENCOUNTER — Encounter (HOSPITAL_BASED_OUTPATIENT_CLINIC_OR_DEPARTMENT_OTHER): Payer: Medicaid Other | Admitting: Internal Medicine

## 2021-05-06 DIAGNOSIS — L97519 Non-pressure chronic ulcer of other part of right foot with unspecified severity: Secondary | ICD-10-CM | POA: Diagnosis not present

## 2021-05-06 DIAGNOSIS — E11621 Type 2 diabetes mellitus with foot ulcer: Secondary | ICD-10-CM | POA: Diagnosis not present

## 2021-05-06 NOTE — Progress Notes (Signed)
MICHIE, MOLNAR ([id number]) Visit Report for 05/06/2021 Chief Complaint Document Details Patient Name: Date of Service: [name], BASTONE 05/06/2021 10:00 A M Medical Record Number: [id number] Patient Account Number: [id number] Date of Birth/Sex: Treating RN: June 05, 1961 (60 y.o. [name] Primary Care Provider: Cato Mulligan Other Clinician: Referring Provider: Treating Provider/Extender: Ned Card in Treatment: 1 Information Obtained from: Patient Chief Complaint wound to the distal portion of the right great toe Electronic Signature(s) Signed: 05/06/2021 1:43:22 PM By: Kalman Shan DO Entered By: Kalman Shan on 05/06/2021 13:37:00 -------------------------------------------------------------------------------- Debridement Details Patient Name: Date of Service: [name] Spray, [place] 05/06/2021 10:00 Donyetta [place] Record Number: [id number] Patient Account Number: [id number] Date of Birth/Sex: Treating RN: 1961/09/21 (60 y.o. Helene Shoe, Meta.Reding Primary Care Provider: Cato Mulligan Other Clinician: Referring Provider: Treating Provider/Extender: Ned Card in Treatment: 1 Debridement Performed for Assessment: Wound #13 Right,Posterior T Great oe Performed By: Physician Kalman Shan, DO Debridement Type: Debridement Severity of Tissue Pre Debridement: Fat layer exposed Level of Consciousness (Pre-procedure): Awake and Alert Pre-procedure Verification/Time Out Yes - 10:30 Taken: Start Time: 10:31 Pain Control: Lidocaine 4% T opical Solution T Area Debrided (L x W): otal 1 (cm) x 1 (cm) = 1 (cm) Tissue and other material debrided: Viable, Non-Viable, Callus, Slough, Subcutaneous, Skin: Dermis , Fibrin/Exudate, Slough Level: Skin/Subcutaneous Tissue Debridement Description: Excisional Instrument: Curette Bleeding: Minimum Hemostasis Achieved: Pressure End Time: 10:37 Procedural Pain: 0 Post Procedural  Pain: 0 Response to Treatment: Procedure was tolerated well Level of Consciousness (Post- Awake and Alert procedure): Post Debridement Measurements of Total Wound Length: (cm) 0.3 Width: (cm) 0.3 Depth: (cm) 0.3 Volume: (cm) 0.021 Character of Wound/Ulcer Post Debridement: Improved Severity of Tissue Post Debridement: Fat layer exposed Post Procedure Diagnosis Same as Pre-procedure Electronic Signature(s) Signed: 05/06/2021 1:43:22 PM By: Kalman Shan DO Signed: 05/06/2021 6:02:04 PM By: Deon Pilling Entered By: Deon Pilling on 05/06/2021 10:37:18 -------------------------------------------------------------------------------- HPI Details Patient Name: Date of Service: [name] Spray, [name] 05/06/2021 10:00 A M Medical Record Number: [id number] Patient Account Number: [id number] Date of Birth/Sex: Treating RN: January 24, 1961 (60 y.o. [name] Primary Care Provider: Cato Mulligan Other Clinician: Referring Provider: Treating Provider/Extender: Ned Card in Treatment: 1 History of Present Illness Location: notice some discharge and callus on the right big toe Quality: Patient reports experiencing a dull pain to affected area(s). Severity: Patient states wound(s) are getting worse. Duration: Patient has had the wound for < 2 weeks prior to presenting for treatment Timing: Pain in wound is Intermittent (comes and goes Context: The wound occurred when the patient was cutting her toenails and noticed some discharge from this area and was very concerned about it. Modifying Factors: Patient wound(s)/ulcer(s) are improving due to:he has seen her PCP who put her on Bactrim and Keflex antibiotic ssociated Signs and Symptoms: Patient reports having decrease discharge she started on her antibiotic. A HPI Description: [name] is a 60 year old female with a past medical history of uncontrolled type 2 diabetes on insulin, osteomyelitis  with amputations of 1st and 2nd toes of her left foot that presents to clinic for wound care to the right great toe wound. She states she noticed the wound 3 weeks ago. Not quite sure how it started. She has been using silver alginate to the area. She was recently hospitalized from a clinic visit in the internal medicine clinic for this issue. While hospitalized she had an MRI of the right foot that showed likely  early acute osteomyelitis with possible septic arthritis. She was discharged on ciprofloxacin for 28 days. She currently does not deny pain increased warmth or erythema to the area. She has some drainage but not purulent. 6/9; patient presents for 1 week follow-up. She has no complaints today and denies signs of infection. She missed her infectious disease appointment yesterday but has it rescheduled for the 21st. She has been using silver alginate to the wound. Electronic Signature(s) Signed: 05/06/2021 1:43:22 PM By: Kalman Shan DO Entered By: Kalman Shan on 05/06/2021 13:38:53 -------------------------------------------------------------------------------- Physical Exam Details Patient Name: Date of Service: [name] 05/06/2021 10:00 A M Medical Record Number: [id number] Patient Account Number: [id number] Date of Birth/Sex: Treating RN: 07-Apr-1961 (60 y.o. [name] Primary Care Provider: Cato Mulligan Other Clinician: Referring Provider: Treating Provider/Extender: Ned Card in Treatment: 1 Constitutional respirations regular, non-labored and within target range for patient.. Cardiovascular 2+ dorsalis pedis/posterior tibialis pulses. Psychiatric pleasant and cooperative. Notes Right great toe: Posterior aspect with small open wound surrounded by nonviable tissue and callus. Post debridement there is granulation tissue present. This does not probe to bone. Electronic Signature(s) Signed: 05/06/2021 1:43:22 PM By:  Kalman Shan DO Entered By: Kalman Shan on 05/06/2021 13:40:55 -------------------------------------------------------------------------------- Physician Orders Details Patient Name: Date of Service: [name] 05/06/2021 10:00 [place] Record Number: [id number] Patient Account Number: [id number] Date of Birth/Sex: Treating RN: 05/13/1961 (60 y.o. 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 - Dr. Dellia Nims Return A ppointment in 2 weeks. - Dr. Heber  Return A Bathing/ Shower/ Hygiene May shower with protection but do not get wound dressing(s) wet. - May use cast wrap for protection Edema Control - Lymphedema / SCD / Other Elevate legs to the level of the heart or above for 30 minutes daily and/or when sitting, a frequency of: - 3-4 times throughout the day. Avoid standing for long periods of time. Off-Loading Wedge shoe to: - *****wound clinic to provide patient with front offloading shoe.**** Patient to wear while standing and walking. Additional Orders / Instructions Other: - ****Patient to follow up with Dr. Megan Salon at Infectious Disease on 05/13/2021.*** Wound Treatment Wound #13 - T Great oe Wound Laterality: Right, Posterior Cleanser: Wound Cleanser (Generic) Every Other Day/15 Days Discharge Instructions: Cleanse the wound with wound cleanser prior to applying a clean dressing using gauze sponges, not tissue or cotton balls. Prim Dressing: KerraCel Ag Gelling Fiber  Dressing, 4x5 in (silver alginate) (Generic) Every Other Day/15 Days ary Discharge Instructions: Apply silver alginate to wound bed as instructed Secondary Dressing: Woven Gauze Sponge, Non-Sterile 4x4 in (Generic) Every Other Day/15 Days Discharge Instructions: Apply over primary dressing as directed. Secondary Dressing: Optifoam Non-Adhesive Dressing, 4x4 in (Generic) Every Other Day/15 Days Discharge Instructions: **Foam donut***Apply over primary dressing as directed. Secured With: Child psychotherapist, Sterile 2x75 (in/in) (Generic) Every Other Day/15 Days Discharge Instructions: Secure with stretch gauze as directed. Secured With: 80M Medipore H Soft Cloth Surgical Tape, 2x2 (in/yd) (Generic) Every Other Day/15 Days Discharge Instructions: Secure dressing with tape as directed. Electronic Signature(s) Signed: 05/06/2021 1:43:22  PM By: Kalman Shan DO Entered By: Kalman Shan on 05/06/2021 13:41:11 -------------------------------------------------------------------------------- Problem List Details Patient Name: Date of Service: SKI, POL[place] 05/06/2021 10:00 A M Medical Record Number: [id number] Patient Account Number: [id number] Date of Birth/Sex: Treating RN: [birth year] (60 y.o. 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 [name] Primary Care Provider: Cato Mulligan Other Clinician: Referring Provider: Treating Provider/Extender: Ned Card in Treatment: 1 Subjective Chief Complaint Information obtained from Patient wound to the distal portion of the right great toe History of Present Illness (HPI) The following HPI elements were documented for the patient's wound: Location: notice some discharge and callus on the right big toe Quality: Patient reports experiencing a dull pain to affected area(s). Severity: Patient states wound(s) are getting worse. Duration: Patient has had the wound for < 2 weeks prior to presenting for treatment Timing: Pain in wound is Intermittent (comes and goes Context: The wound occurred when the patient was cutting her toenails and noticed some discharge from this area and was very concerned about it. Modifying Factors: Patient wound(s)/ulcer(s) are improving due to:he has seen her PCP who put her on Bactrim and Keflex antibiotic Associated Signs and Symptoms: Patient reports having decrease discharge she started on her antibiotic. Ms. Emine Lopata is a 60 year old female with a past medical history of uncontrolled type 2 diabetes on insulin, osteomyelitis with amputations of 1st and 2nd toes of her left foot that presents to clinic for wound care to the right great toe wound. She states she noticed the wound 3 weeks ago. Not quite sure how it started. She has been using silver alginate to the area. She was recently hospitalized from a clinic visit in the internal medicine clinic for  this issue. While hospitalized she had an MRI of the right foot that showed likely early acute osteomyelitis with possible septic arthritis. She was discharged on ciprofloxacin for 28 days. She currently does not deny pain increased warmth or erythema to the area. She has some drainage but not purulent. 6/9; patient presents for 1 week follow-up. She has no complaints today and denies signs of infection. She missed her infectious disease appointment yesterday but has it rescheduled for the 21st. She has been using silver alginate to the wound. Patient History Information obtained from Patient. Family History Diabetes - Father,Child, Hypertension - Mother,Father, No family history of Cancer, Heart Disease, Hereditary Spherocytosis, Kidney Disease, Lung Disease, Seizures, Stroke, Thyroid Problems, Tuberculosis. Social History Never smoker, Marital Status - Divorced, Alcohol Use - Never, Drug Use - No History, Caffeine Use - Rarely - soda. Medical History Eyes Denies history of Cataracts, Glaucoma, Optic Neuritis Ear/Nose/Mouth/Throat Denies history of Chronic sinus problems/congestion, Middle ear problems Hematologic/Lymphatic Patient has history of Anemia  Denies history of Hemophilia, Human Immunodeficiency Virus, Lymphedema, Sickle Cell Disease Respiratory Patient has history of Asthma Denies history of Aspiration, Chronic Obstructive Pulmonary Disease (COPD), Pneumothorax, Sleep Apnea, Tuberculosis Cardiovascular Patient has history of Hypertension Denies history of Angina, Arrhythmia, Congestive Heart Failure, Coronary Artery Disease, Deep Vein Thrombosis, Hypotension, Myocardial Infarction, Peripheral Arterial Disease, Peripheral Venous Disease, Phlebitis, Vasculitis Gastrointestinal Denies history of Cirrhosis , Colitis, Crohnoos, Hepatitis A, Hepatitis B, Hepatitis C Endocrine Patient has history of Type II Diabetes Denies history of Type I Diabetes Genitourinary Denies history  of End Stage Renal Disease Immunological Denies history of Lupus Erythematosus, Raynaudoos, Scleroderma Integumentary (Skin) Denies history of History of Burn Musculoskeletal Patient has history of Osteomyelitis - hx left great toe Denies history of Gout, Rheumatoid Arthritis, Osteoarthritis Neurologic Patient has history of Neuropathy Denies history of Dementia, Quadriplegia, Paraplegia, Seizure Disorder Oncologic Denies history of Received Chemotherapy, Received Radiation Psychiatric Patient has history of Confinement Anxiety Denies history of Anorexia/bulimia Patient is treated with Insulin, Oral Agents. Blood sugar is tested. Blood sugar results noted at the following times: Lunch - 120-160. Hospitalization/Surgery History - left second toe amputation. - left great toe amputation. - c-section x4. Medical A Surgical History Notes nd Gastrointestinal GERD Objective Constitutional respirations regular, non-labored and within target range for patient.. Vitals Time Taken: 10:16 AM, Temperature: 98.3 F, Pulse: 98 bpm, Respiratory Rate: 18 breaths/min, Blood Pressure: 151/80 mmHg, Capillary Blood Glucose: 169 mg/dl. Cardiovascular 2+ dorsalis pedis/posterior tibialis pulses. Psychiatric pleasant and cooperative. General Notes: Right great toe: Posterior aspect with small open wound surrounded by nonviable tissue and callus. Post debridement there is granulation tissue present. This does not probe to bone. Integumentary (Hair, Skin) Wound #13 status is Open. Original cause of wound was Gradually Appeared. The date acquired was: 04/05/2021. The wound has been in treatment 1 weeks. The wound is located on the [place]. The wound measures 0.2cm length x 0.3cm width x 0.3cm depth; 0.047cm^2 area and 0.014cm^3 volume. oe There is Fat Layer (Subcutaneous Tissue) exposed. There is no tunneling or undermining noted. There is a medium amount of sanguinous drainage noted.  The wound margin is well defined and not attached to the wound base. There is large (67-100%) red granulation within the wound bed. There is no necrotic tissue within the wound bed. 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 The nonviable tissue and callus was debrided today in office. There are no obvious signs of infection. She does have early acute osteomyelitis and this toe and she is scheduled to see infectious disease on 6/21. She continues to take ciprofloxacin. I recommended she continue using silver alginate to the wound bed. We will also get her an offloading shoe to help relieve the pressure on this area. Procedures Wound #13 Pre-procedure diagnosis of Wound #13 is a Diabetic Wound/Ulcer of the Lower Extremity located on the [place] .Severity of Tissue Pre oe Debridement is: Fat layer exposed. There was a Excisional Skin/Subcutaneous Tissue Debridement with a total area of 1 sq cm performed by Kalman Shan, DO. With the following instrument(s): Curette to remove Viable and Non-Viable tissue/material. Material removed includes Callus, Subcutaneous Tissue, Slough, Skin: Dermis, and Fibrin/Exudate after achieving pain control using Lidocaine 4% Topical Solution. A time out was conducted at 10:30, prior to  the start of the procedure. A Minimum amount of bleeding was controlled with Pressure. The procedure was tolerated well with a pain level of 0 throughout and a pain level of 0 following the procedure. Post Debridement Measurements: 0.3cm length x 0.3cm width x 0.3cm depth; 0.021cm^3 volume. Character of Wound/Ulcer Post Debridement is improved. Severity of Tissue Post Debridement is: Fat layer  exposed. Post procedure Diagnosis Wound #13: Same as Pre-Procedure Plan Follow-up Appointments: Return Appointment in 1 week. - Dr. Dellia Nims Return Appointment in 2 weeks. - Dr. Heber  Bathing/ Shower/ Hygiene: May shower with protection but do not get wound dressing(s) wet. - May use cast wrap for protection Edema Control - Lymphedema / SCD / Other: Elevate legs to the level of the heart or above for 30 minutes daily and/or when sitting, a frequency of: - 3-4 times throughout the day. Avoid standing for long periods of time. Off-Loading: Wedge shoe to: - *****wound clinic to provide patient with front offloading shoe.**** Patient to wear while standing and walking. Additional Orders / Instructions: Other: - ****Patient to follow up with Dr. Megan Salon at Infectious Disease on 05/13/2021.*** WOUND #13: - T Great Wound Laterality: Right, Posterior oe Cleanser: Wound Cleanser (Generic) Every Other Day/15 Days Discharge Instructions: Cleanse the wound with wound cleanser prior to applying a clean dressing using gauze sponges, not tissue or cotton balls. Prim Dressing: KerraCel Ag Gelling Fiber Dressing, 4x5 in (silver alginate) (Generic) Every Other Day/15 Days ary Discharge Instructions: Apply silver alginate to wound bed as instructed Secondary Dressing: Woven Gauze Sponge, Non-Sterile 4x4 in (Generic) Every Other Day/15 Days Discharge Instructions: Apply over primary dressing as directed. Secondary Dressing: Optifoam Non-Adhesive Dressing, 4x4 in (Generic) Every Other Day/15 Days Discharge Instructions: **Foam donut***Apply over primary dressing as directed. Secured With: Child psychotherapist, Sterile 2x75 (in/in) (Generic) Every Other Day/15 Days Discharge Instructions: Secure with stretch gauze as directed. Secured With: 20M Medipore H Soft Cloth Surgical T ape, 2x2 (in/yd) (Generic) Every Other Day/15 Days Discharge Instructions: Secure dressing with tape as directed. 1. In  office sharp debridement 2. Silver alginate 3. Offloading shoe 4. Follow-up in a week Electronic Signature(s) Signed: 05/06/2021 1:43:22 PM By: Kalman Shan DO Entered By: Kalman Shan on 05/06/2021 13:42:54 -------------------------------------------------------------------------------- HxROS Details Patient Name: Date of Service: [name] Spray, [name] 05/06/2021 10:00 [place] Record Number: [id number] Patient Account Number: [id number] Date of Birth/Sex: Treating RN: [birth year] (60 y.o. [name] Primary Care Provider: Cato Mulligan Other Clinician: Referring Provider: Treating Provider/Extender: Ned Card in Treatment: 1 Label Progress Note Print Version as History and Physical for this encounter Information Obtained From Patient Eyes Medical History: Negative for: Cataracts; Glaucoma; Optic Neuritis Ear/Nose/Mouth/Throat Medical History: Negative for: Chronic sinus problems/congestion; Middle ear problems Hematologic/Lymphatic Medical History: Positive for: Anemia Negative for: Hemophilia; Human Immunodeficiency Virus; Lymphedema; Sickle Cell Disease Respiratory Medical History: Positive for: Asthma Negative for: Aspiration; Chronic Obstructive Pulmonary Disease (COPD); Pneumothorax; Sleep Apnea; Tuberculosis Cardiovascular Medical History: Positive for: Hypertension Negative for: Angina; Arrhythmia; Congestive Heart Failure; Coronary Artery Disease; Deep Vein Thrombosis; Hypotension; Myocardial Infarction; Peripheral Arterial Disease; Peripheral Venous Disease; Phlebitis; Vasculitis Gastrointestinal Medical History: Negative for: Cirrhosis ; Colitis; Crohns; Hepatitis A; Hepatitis B; Hepatitis C Past Medical History Notes: GERD Endocrine Medical History: Positive for: Type II Diabetes Negative for: Type I Diabetes Time with diabetes: 79 years Treated with: Insulin, Oral agents Blood sugar tested every day:  Yes Tested : 2 times per day Blood sugar testing results: Lunch: 120-160 Genitourinary  Medical History: Negative for: End Stage Renal Disease Immunological Medical History: Negative for: Lupus Erythematosus; Raynauds; Scleroderma Integumentary (Skin) Medical History: Negative for: History of Burn Musculoskeletal Medical History: Positive for: Osteomyelitis - hx left great toe Negative for: Gout; Rheumatoid Arthritis; Osteoarthritis Neurologic Medical History: Positive for: Neuropathy Negative for: Dementia; Quadriplegia; Paraplegia; Seizure Disorder Oncologic Medical History: Negative for: Received Chemotherapy; Received Radiation Psychiatric Medical History: Positive for: Confinement Anxiety Negative for: Anorexia/bulimia Immunizations Pneumococcal Vaccine: Received Pneumococcal Vaccination: Yes Immunization Notes: pt. 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 c-section x4 Family and Social History Cancer: No; Diabetes: Yes - Father,Child; Heart Disease: No; Hereditary Spherocytosis: No; Hypertension: Yes - Mother,Father; Kidney Disease: No; Lung Disease: No; Seizures: No; Stroke: No; Thyroid Problems: No; Tuberculosis: No; Never smoker; Marital Status - Divorced; Alcohol Use: Never; Drug Use: No History; Caffeine Use: Rarely - soda; Financial Concerns: No; Food, Clothing or Shelter Needs: No; Support System Lacking: No; Transportation Concerns: No Electronic Signature(s) Signed: 05/06/2021 1:43:22 PM By: Kalman Shan DO Signed: 05/06/2021 6:02:04 PM By: Deon Pilling Entered By: Kalman Shan on 05/06/2021 13:39:01 -------------------------------------------------------------------------------- SuperBill Details Patient Name: Date of Service: SAVREEN, GEBHARDT 05/06/2021 Medical Record Number: [id number] Patient Account Number: [id number] Date of  Birth/Sex: Treating RN: 1961/03/04 (60 y.o. 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 E11.65 Type 2 diabetes mellitus with hyperglycemia Facility Procedures CPT4 Code: [id number] Description: 55974 - DEB SUBQ TISSUE 20 SQ CM/< ICD-10 Diagnosis Description 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 Modifier: ty Quantity: 1 Physician Procedures : CPT4 Code Description Modifier [id number] - WC PHYS SUBQ TISS 20 SQ CM ICD-10 Diagnosis Description 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

## 2021-05-07 NOTE — Progress Notes (Signed)
[name], [name] ([id number]) Visit Report for 05/06/2021 Arrival Information Details Patient Name: Date of Service: [name], [name] 05/06/2021 10:00 [place] Record Number: [id number] Patient Account Number: [id number] Date of Birth/Sex: Treating RN: [birth year] (60 y.o. [name] Primary Care Everest Brod: Cato Mulligan Other Clinician: Referring Averi Kilty: Treating Scout Guyett/Extender: Ned Card in Treatment: 1 Visit Information History Since Last Visit Added or deleted any medications: No Patient Arrived: Ambulatory Any Debra allergies or adverse reactions: No Arrival Time: 10:11 Had a fall or experienced change in No Transfer Assistance: None activities of daily living that may affect Patient Identification Verified: Yes risk of falls: Secondary Verification Process Completed: Yes Signs or symptoms of abuse/neglect since last visito No Patient Requires Transmission-Based Precautions: No Hospitalized since last visit: No Patient Has Alerts: Yes Implantable device outside of the clinic excluding No Patient Alerts: ABI R=1.18 cellular tissue based products placed in the center ABI L=1.1 since last visit: Has Dressing in Place as Prescribed: Yes Pain Present Now: No Electronic Signature(s) Signed: 05/06/2021 5:42:55 PM By: Lorrin Jackson Entered By: Lorrin Jackson on 05/06/2021 10:14:15 -------------------------------------------------------------------------------- Lower Extremity Assessment Details Patient Name: Date of Service: [name], [name] 05/06/2021 10:00 [place] Record Number: [id number] Patient Account Number: [id number] Date of Birth/Sex: Treating RN: 1960-12-01 (60 y.o. [name] Primary Care Pritesh Sobecki: Cato Mulligan Other Clinician: Referring Lowen Mansouri: Treating Tyshawn Keel/Extender: Ned Card in Treatment: 1 Edema Assessment Assessed: Shirlyn Goltz: No] Patrice Paradise: Yes] Edema: [Left: N] [Right:  o] Calf Left: Right: Point of Measurement: 31 cm From Medial Instep 37 cm Ankle Left: Right: Point of Measurement: 10 cm From Medial Instep 24.5 cm Vascular Assessment Pulses: Dorsalis Pedis Palpable: [Right:Yes] Electronic Signature(s) Signed: 05/06/2021 5:42:55 PM By: Lorrin Jackson Entered By: Lorrin Jackson on 05/06/2021 10:18:35 -------------------------------------------------------------------------------- Multi Wound Chart Details Patient Name: Date of Service: [name], [name] 05/06/2021 10:00 A M Medical Record Number: [id number] Patient Account Number: [id number] Date of Birth/Sex: Treating RN: [birth year] (60 y.o. [name] Primary Care Lancer Thurner: Cato Mulligan Other Clinician: Referring Meta Kroenke: Treating Mishal Probert/Extender: Ned Card in Treatment: 1 Vital Signs Height(in): Capillary Blood Glucose(mg/dl): 169 Weight(lbs): Pulse(bpm): 74 Body Mass Index(BMI): Blood Pressure(mmHg): 151/80 Temperature(F): 98.3 Respiratory Rate(breaths/min): 18 Photos: [13:No Photos Right, Posterior T Great oe] [N/A:N/A N/A] Wound Location: [13:Gradually Appeared] [N/A:N/A] Wounding Event: [13:Diabetic Wound/Ulcer of the Lower] [N/A:N/A] Primary Etiology: [13:Extremity Anemia, Asthma, Hypertension, Type N/A] Comorbid History: [13:II Diabetes, Osteomyelitis, Neuropathy, Confinement Anxiety 04/05/2021] [N/A:N/A] Date Acquired: [13:1] [N/A:N/A] Weeks of Treatment: [13:Open] [N/A:N/A] Wound Status: [13:0.2x0.3x0.3] [N/A:N/A] Measurements L x W x D (cm) [13:0.047] [N/A:N/A] A (cm) : rea [13:0.014] [N/A:N/A] Volume (cm) : [13:0.00%] [N/A:N/A] % Reduction in A [13:rea: 0.00%] [N/A:N/A] % Reduction in Volume: [13:Grade 1] [N/A:N/A] Classification: [13:Medium] [N/A:N/A] Exudate A mount: [13:Sanguinous] [N/A:N/A] Exudate Type: [13:red] [N/A:N/A] Exudate Color: [13:Well defined, not attached] [N/A:N/A] Wound Margin: [13:Large (67-100%)]  [N/A:N/A] Granulation A mount: [13:Red] [N/A:N/A] Granulation Quality: [13:None Present (0%)] [N/A:N/A] Necrotic A mount: [13:Fat Layer (Subcutaneous Tissue): Yes N/A] Exposed Structures: [13:Fascia: No Tendon: No Muscle: No Joint: No Bone: No None] [N/A:N/A] Epithelialization: [13:Debridement - Excisional] [N/A:N/A] Debridement: Pre-procedure Verification/Time Out 10:30 [N/A:N/A] Taken: [13:Lidocaine 4% Topical Solution] [N/A:N/A] Pain Control: [13:Callus, Subcutaneous, Slough] [N/A:N/A] Tissue Debrided: [13:Skin/Subcutaneous Tissue] [N/A:N/A] Level: [13:1] [N/A:N/A] Debridement A (sq cm): [13:rea Curette] [N/A:N/A] Instrument: [13:Minimum] [N/A:N/A] Bleeding: [13:Pressure] [N/A:N/A] Hemostasis A chieved: [13:0] [N/A:N/A] Procedural Pain: [13:0] [N/A:N/A] Post Procedural Pain: [13:Procedure was tolerated well] [N/A:N/A] Debridement Treatment Response: [13:0.3x0.3x0.3] [N/A:N/A] Post  Debridement Measurements L x W x D (cm) [13:0.021] [N/A:N/A] Post Debridement Volume: (cm) [13:Calloused Periwound] [N/A:N/A] Assessment Notes: [13:Debridement] [N/A:N/A] Procedures Performed: Treatment Notes Electronic Signature(s) Signed: 05/06/2021 1:43:22 PM By: Kalman Shan DO Signed: 05/06/2021 6:02:04 PM By: Deon Pilling Entered By: Kalman Shan on 05/06/2021 13:36:49 -------------------------------------------------------------------------------- Multi-Disciplinary Care Plan Details Patient Name: Date of Service: [name], [name] 05/06/2021 10:00 [place] Record Number: [id number] Patient Account Number: [id number] Date of Birth/Sex: Treating RN: [birth year] (60 y.o. Helene Shoe, Meta.Reding Primary Care Analie Katzman: Cato Mulligan Other Clinician: Referring Aubryn Spinola: Treating Avalie Oconnor/Extender: Ned Card in Treatment: 1 Active Inactive Nutrition Nursing Diagnoses: Impaired glucose control: actual or potential Potential for alteratiion in  Nutrition/Potential for imbalanced nutrition Goals: Patient/caregiver agrees to and verbalizes understanding of need to obtain nutritional consultation Date Initiated: 05/06/2021 Target Resolution Date: 05/27/2021 Goal Status: Active Patient/caregiver will maintain therapeutic glucose control Date Initiated: 05/06/2021 Target Resolution Date: 06/03/2021 Goal Status: Active Interventions: Assess HgA1c results as ordered upon admission and as needed Provide education on elevated blood sugars and impact on wound healing Provide education on nutrition Treatment Activities: Patient referred to Primary Care Physician for further nutritional evaluation : 05/06/2021 Notes: Wound/Skin Impairment Nursing Diagnoses: Knowledge deficit related to ulceration/compromised skin integrity Goals: Patient/caregiver will verbalize understanding of skin care regimen Date Initiated: 05/06/2021 Target Resolution Date: 06/04/2021 Goal Status: Active Interventions: Assess patient/caregiver ability to obtain necessary supplies Assess patient/caregiver ability to perform ulcer/skin care regimen upon admission and as needed Provide education on ulcer and skin care Treatment Activities: Skin care regimen initiated : 05/06/2021 Topical wound management initiated : 05/06/2021 Notes: Electronic Signature(s) Signed: 05/06/2021 6:02:04 PM By: Deon Pilling Entered By: Deon Pilling on 05/06/2021 10:36:02 -------------------------------------------------------------------------------- Pain Assessment Details Patient Name: Date of Service: [name], [name] 05/06/2021 10:00 [place] Record Number: [id number] Patient Account Number: [id number] Date of Birth/Sex: Treating RN: 09-Feb-1961 (60 y.o. [name] Primary Care Layali Freund: Cato Mulligan Other Clinician: Referring Domonique Cothran: Treating Arik Husmann/Extender: Ned Card in Treatment: 1 Active Problems Location of Pain Severity and  Description of Pain Patient Has Paino No Site Locations Pain Management and Medication Current Pain Management: Electronic Signature(s) Signed: 05/06/2021 5:42:55 PM By: Lorrin Jackson Entered By: Lorrin Jackson on 05/06/2021 10:17:11 -------------------------------------------------------------------------------- Patient/Caregiver Education Details Patient Name: Date of Service: [name], [name] 6/9/2022andnbsp10:00 [place] Record Number: [id number] Patient Account Number: [id number] Date of Birth/Gender: Treating RN: 1961-01-06 (60 y.o. [name] Primary Care Physician: Cato Mulligan Other Clinician: Referring Physician: Treating Physician/Extender: Ned Card in Treatment: 1 Education Assessment Education Provided To: Patient Education Topics Provided Elevated Blood Sugar/ Impact on Healing: Handouts: Elevated Blood Sugars: How Do They Affect Wound Healing Methods: Explain/Verbal Responses: Reinforcements needed Electronic Signature(s) Signed: 05/06/2021 6:02:04 PM By: Deon Pilling Entered By: Deon Pilling on 05/06/2021 10:36:27 -------------------------------------------------------------------------------- Wound Assessment Details Patient Name: Date of Service: [name], [name] 05/06/2021 10:00 [place] Record Number: [id number] Patient Account Number: [id number] Date of Birth/Sex: Treating RN: Nov 03, 1961 (60 y.o. [name] Primary Care Venicia Vandall: Cato Mulligan Other Clinician: Referring Raine Blodgett: Treating Zylon Creamer/Extender: Ned Card in Treatment: 1 Wound Status Wound Number: 13 Primary Diabetic Wound/Ulcer of the Lower Extremity Etiology: Wound Location: Right, Posterior T Great oe Wound Open Wounding Event: Gradually Appeared Status: Date Acquired: 04/05/2021 Comorbid Anemia, Asthma, Hypertension, Type II Diabetes, Osteomyelitis, Weeks Of Treatment: 1 History: Neuropathy,  Confinement Anxiety Clustered Wound: No Photos Wound Measurements Length: (cm) 0.2  Width: (cm) 0.3 Depth: (cm) 0.3 Area: (cm) 0.047 Volume: (cm) 0.014 % Reduction in Area: 0% % Reduction in Volume: 0% Epithelialization: None Tunneling: No Undermining: No Wound Description Classification: Grade 1 Wound Margin: Well defined, not attached Exudate Amount: Medium Exudate Type: Sanguinous Exudate Color: red Foul Odor After Cleansing: No Slough/Fibrino No Wound Bed Granulation Amount: Large (67-100%) Exposed Structure Granulation Quality: Red Fascia Exposed: No Necrotic Amount: None Present (0%) Fat Layer (Subcutaneous Tissue) Exposed: Yes Tendon Exposed: No Muscle Exposed: No Joint Exposed: No Bone Exposed: No Assessment Notes Calloused Periwound Electronic Signature(s) Signed: 05/06/2021 5:42:55 PM By: Lorrin Jackson Signed: 05/07/2021 3:50:24 PM By: Sandre Kitty Entered By: Sandre Kitty on 05/06/2021 17:05:50 -------------------------------------------------------------------------------- Vitals Details Patient Name: Date of Service: [name], [name] 05/06/2021 10:00 [place] Record Number: [id number] Patient Account Number: [id number] Date of Birth/Sex: Treating RN: [birth year] (60 y.o. [name] Primary Care Tanairi Cypert: Cato Mulligan Other Clinician: Referring Raynell Upton: Treating Giovonnie Trettel/Extender: Ned Card in Treatment: 1 Vital Signs Time Taken: 10:16 Temperature (F): 98.3 Pulse (bpm): 98 Respiratory Rate (breaths/min): 18 Blood Pressure (mmHg): 151/80 Capillary Blood Glucose (mg/dl): 169 Reference Range: 80 - 120 mg / dl Electronic Signature(s) Signed: 05/06/2021 5:42:55 PM By: Lorrin Jackson Entered By: Lorrin Jackson on 05/06/2021 10:16:57

## 2021-05-13 ENCOUNTER — Encounter (HOSPITAL_BASED_OUTPATIENT_CLINIC_OR_DEPARTMENT_OTHER): Payer: Medicaid Other | Admitting: Internal Medicine

## 2021-05-13 ENCOUNTER — Other Ambulatory Visit: Payer: Self-pay

## 2021-05-13 DIAGNOSIS — E11621 Type 2 diabetes mellitus with foot ulcer: Secondary | ICD-10-CM | POA: Diagnosis not present

## 2021-05-13 NOTE — Progress Notes (Addendum)
[name], [name] ([id number]) Visit Report for 05/13/2021 Arrival Information Details Patient Name: Date of Service: [name], [name] 05/13/2021 11:15 A M Medical Record Number: [id number] Patient Account Number: [id number] Date of Birth/Sex: Treating RN: 1961-01-09 (60 y.o. [name] Primary Care Sahej Schrieber: Cato Mulligan Other Clinician: Referring Denasia Venn: Treating Cylan Borum/Extender: Jonette Eva in Treatment: 2 Visit Information History Since Last Visit Added or deleted any medications: No Patient Arrived: Ambulatory Any new allergies or adverse reactions: No Arrival Time: 11:51 Had a fall or experienced change in No Transfer Assistance: None activities of daily living that may affect Patient Identification Verified: Yes risk of falls: Secondary Verification Process Completed: Yes Signs or symptoms of abuse/neglect since last visito No Patient Requires Transmission-Based Precautions: No Hospitalized since last visit: No Patient Has Alerts: Yes Implantable device outside of the clinic excluding No Patient Alerts: ABI R=1.18 cellular tissue based products placed in the center ABI L=1.1 since last visit: Has Dressing in Place as Prescribed: Yes Pain Present Now: No Electronic Signature(s) Signed: 05/13/2021 11:51:59 AM By: Lorrin Jackson Entered By: Lorrin Jackson on 05/13/2021 11:51:59 -------------------------------------------------------------------------------- Clinic Level of Care Assessment Details Patient Name: Date of Service: [name], [name] 05/13/2021 11:15 A M Medical Record Number: [id number] Patient Account Number: [id number] Date of Birth/Sex: Treating RN: 03-18-1961 (60 y.o. [name] Primary Care Huntleigh Doolen: Cato Mulligan Other Clinician: Referring Siara Gorder: Treating Lachandra Dettmann/Extender: Jonette Eva in Treatment: 2 Clinic Level of Care Assessment Items TOOL 4 Quantity Score X- 1 0 Use  when only an EandM is performed on FOLLOW-UP visit ASSESSMENTS - Nursing Assessment / Reassessment X- 1 10 Reassessment of Co-morbidities (includes updates in patient status) X- 1 5 Reassessment of Adherence to Treatment Plan ASSESSMENTS - Wound and Skin A ssessment / Reassessment X - Simple Wound Assessment / Reassessment - one wound 1 5 []  - 0 Complex Wound Assessment / Reassessment - multiple wounds []  - 0 Dermatologic / Skin Assessment (not related to wound area) ASSESSMENTS - Focused Assessment []  - 0 Circumferential Edema Measurements - multi extremities []  - 0 Nutritional Assessment / Counseling / Intervention []  - 0 Lower Extremity Assessment (monofilament, tuning fork, pulses) []  - 0 Peripheral Arterial Disease Assessment (using hand held doppler) ASSESSMENTS - Ostomy and/or Continence Assessment and Care []  - 0 Incontinence Assessment and Management []  - 0 Ostomy Care Assessment and Management (repouching, etc.) PROCESS - Coordination of Care []  - 0 Simple Patient / Family Education for ongoing care X- 1 20 Complex (extensive) Patient / Family Education for ongoing care []  - 0 Staff obtains Programmer, systems, Records, T Results / Process Orders est []  - 0 Staff telephones HHA, Nursing Homes / Clarify orders / etc []  - 0 Routine Transfer to another Facility (non-emergent condition) []  - 0 Routine [facility] Admission (non-emergent condition) []  - 0 New Admissions / Biomedical engineer / Ordering NPWT Apligraf, etc. , []  - 0 Emergency [facility] Admission (emergent condition) []  - 0 Simple Discharge Coordination []  - 0 Complex (extensive) Discharge Coordination PROCESS - Special Needs []  - 0 Pediatric / Minor Patient Management []  - 0 Isolation Patient Management []  - 0 Hearing / Language / Visual special needs []  - 0 Assessment of Community assistance (transportation, D/C planning, etc.) []  - 0 Additional assistance / Altered mentation []  - 0 Support  Surface(s) Assessment (bed, cushion, seat, etc.) INTERVENTIONS - Wound Cleansing / Measurement X - Simple Wound Cleansing - one wound 1 5 []  - 0 Complex Wound Cleansing - multiple  wounds []  - 0 Wound Imaging (photographs - any number of wounds) []  - 0 Wound Tracing (instead of photographs) X- 1 5 Simple Wound Measurement - one wound []  - 0 Complex Wound Measurement - multiple wounds INTERVENTIONS - Wound Dressings []  - 0 Small Wound Dressing one or multiple wounds X- 1 15 Medium Wound Dressing one or multiple wounds []  - 0 Large Wound Dressing one or multiple wounds []  - 0 Application of Medications - topical []  - 0 Application of Medications - injection INTERVENTIONS - Miscellaneous []  - 0 External ear exam []  - 0 Specimen Collection (cultures, biopsies, blood, body fluids, etc.) []  - 0 Specimen(s) / Culture(s) sent or taken to Lab for analysis []  - 0 Patient Transfer (multiple staff / Civil Service fast streamer / Similar devices) []  - 0 Simple Staple / Suture removal (25 or less) []  - 0 Complex Staple / Suture removal (26 or more) []  - 0 Hypo / Hyperglycemic Management (close monitor of Blood Glucose) []  - 0 Ankle / Brachial Index (ABI) - do not check if billed separately X- 1 5 Vital Signs Has the patient been seen at the [facility] within the last three years: Yes Total Score: 70 Level Of Care: New/Established - Level 2 Electronic Signature(s) Signed: 05/13/2021 6:07:06 PM By: Lorrin Jackson Entered By: Lorrin Jackson on 05/13/2021 11:54:19 -------------------------------------------------------------------------------- Encounter Discharge Information Details Patient Name: Date of Service: [name], [name] 05/13/2021 11:15 A M Medical Record Number: [id number] Patient Account Number: [id number] Date of Birth/Sex: Treating RN: Dec 28, 1960 (60 y.o. [name] Primary Care Pragya Lofaso: Cato Mulligan Other Clinician: Referring Talmage Teaster: Treating Dason Mosley/Extender: Jonette Eva in Treatment: 2 Encounter Discharge Information Items Discharge Condition: Stable Ambulatory Status: Ambulatory Discharge Destination: Home Transportation: Private Auto Schedule Follow-up Appointment: Yes Clinical Summary of Care: Provided on 05/13/2021 Form Type Recipient Paper Patient Patient Electronic Signature(s) Signed: 05/13/2021 11:53:50 AM By: Lorrin Jackson Entered By: Lorrin Jackson on 05/13/2021 11:53:50 -------------------------------------------------------------------------------- Patient/Caregiver Education Details Patient Name: Date of Service: [name], [name] 6/16/2022andnbsp11:15 [place] Number: [id number] Patient Account Number: [id number] Date of Birth/Gender: Treating RN: October 17, 1961 (60 y.o. [name] Primary Care Physician: Cato Mulligan Other Clinician: Referring Physician: Treating Physician/Extender: Jonette Eva in Treatment: 2 Education Assessment Education Provided To: Patient Education Topics Provided Elevated Blood Sugar/ Impact on Healing: Methods: Explain/Verbal Responses: State content correctly Wound/Skin Impairment: Methods: Explain/Verbal Responses: State content correctly Electronic Signature(s) Signed: 05/13/2021 6:07:06 PM By: Lorrin Jackson Entered By: Lorrin Jackson on 05/13/2021 11:53:33 -------------------------------------------------------------------------------- Wound Assessment Details Patient Name: Date of Service: [name], [name] 05/13/2021 11:15 A M Medical Record Number: [id number] Patient Account Number: [id number] Date of Birth/Sex: Treating RN: [birth year] (60 y.o. [name] Primary Care Shamarie Call: Cato Mulligan Other Clinician: Referring Nello Corro: Treating Caedence Snowden/Extender: Jonette Eva in Treatment: 2 Wound Status Wound Number: 13 Primary Diabetic Wound/Ulcer of the Lower  Extremity Etiology: Wound Location: Right, Posterior T Great oe Wound Open Wounding Event: Gradually Appeared Status: Date Acquired: 04/05/2021 Comorbid Anemia, Asthma, Hypertension, Type II Diabetes, Weeks Of Treatment: 2 History: Osteomyelitis, Neuropathy, Confinement Anxiety Clustered Wound: No Wound Measurements Length: (cm) 0.2 Width: (cm) 0.3 Depth: (cm) 0.3 Area: (cm) 0.047 Volume: (cm) 0.014 % Reduction in Area: 0% % Reduction in Volume: 0% Epithelialization: None Tunneling: No Undermining: No Wound Description Classification: Grade 1 Wound Margin: Well defined, not attached Exudate Amount: Medium Exudate Type: Sanguinous Exudate Color: red Foul Odor After Cleansing: No Slough/Fibrino No Wound Bed  Granulation Amount: Large (67-100%) Exposed Structure Granulation Quality: Pink, Pale Fascia Exposed: No Necrotic Amount: None Present (0%) Fat Layer (Subcutaneous Tissue) Exposed: Yes Tendon Exposed: No Muscle Exposed: No Joint Exposed: No Bone Exposed: No Treatment Notes Wound #13 (Toe Great) Wound Laterality: Right, Posterior Cleanser Wound Cleanser Discharge Instruction: Cleanse the wound with wound cleanser prior to applying a clean dressing using gauze sponges, not tissue or cotton balls. Peri-Wound Care Topical Primary Dressing KerraCel Ag Gelling Fiber Dressing, 4x5 in (silver alginate) Discharge Instruction: Apply silver alginate to wound bed as instructed Secondary Dressing Woven Gauze Sponge, Non-Sterile 4x4 in Discharge Instruction: Apply over primary dressing as directed. Optifoam Non-Adhesive Dressing, 4x4 in Discharge Instruction: **Foam donut***Apply over primary dressing as directed. Secured With 61M Sparkman Surgical T ape, 2x2 (in/yd) Discharge Instruction: Secure dressing with tape as directed. Conforming Stretch Gauze Bandage, Sterile 2x75 (in/in) Discharge Instruction: Secure with stretch gauze as directed. Compression  Wrap Compression Stockings Add-Ons Electronic Signature(s) Signed: 05/13/2021 11:52:53 AM By: Lorrin Jackson Entered By: Lorrin Jackson on 05/13/2021 11:52:52 -------------------------------------------------------------------------------- Vitals Details Patient Name: Date of Service: [name], [name] 05/13/2021 11:15 A M Medical Record Number: [id number] Patient Account Number: [id number] Date of Birth/Sex: Treating RN: August 16, 1961 (60 y.o. [name] Primary Care Payzlee Ryder: Cato Mulligan Other Clinician: Referring Avion Patella: Treating Sheldon Sem/Extender: Jonette Eva in Treatment: 2 Vital Signs Time Taken: 11:45 Temperature (F): 98.2 Pulse (bpm): 92 Respiratory Rate (breaths/min): 18 Blood Pressure (mmHg): 148/82 Reference Range: 80 - 120 mg / dl Electronic Signature(s) Signed: 05/13/2021 11:52:23 AM By: Lorrin Jackson Entered By: Lorrin Jackson on 05/13/2021 11:52:23

## 2021-05-14 NOTE — Progress Notes (Signed)
[name], [name] ([id number]) Visit Report for 05/13/2021 [place] Details Patient Name: Date of Service: [name], [name] 05/13/2021 Medical Record Number: [id number] Patient Account Number: [id number] Date of Birth/Sex: Treating RN: [birth year] (60 y.o. 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

## 2021-05-18 ENCOUNTER — Ambulatory Visit (INDEPENDENT_AMBULATORY_CARE_PROVIDER_SITE_OTHER): Payer: Medicaid Other | Admitting: Internal Medicine

## 2021-05-18 ENCOUNTER — Encounter: Payer: Self-pay | Admitting: Internal Medicine

## 2021-05-18 ENCOUNTER — Other Ambulatory Visit: Payer: Self-pay

## 2021-05-18 DIAGNOSIS — E1169 Type 2 diabetes mellitus with other specified complication: Secondary | ICD-10-CM

## 2021-05-18 DIAGNOSIS — M869 Osteomyelitis, unspecified: Secondary | ICD-10-CM

## 2021-05-18 NOTE — Assessment & Plan Note (Signed)
She may have had very early and minimal osteomyelitis of the distal tuft of her right great toe.  She has improved on empiric ciprofloxacin.  She will complete it in 3 days.  I will repeat inflammatory markers today and arrange phone follow-up next week.

## 2021-05-18 NOTE — Progress Notes (Signed)
[facility] for Infectious Disease  Reason for Consult: Diabetic foot infection Referring Provider: Dr. Kalman Shan  Assessment: She may have had very early and minimal osteomyelitis of the distal tuft of her right great toe.  She has improved on empiric ciprofloxacin.  She will complete it in 3 days.  I will repeat inflammatory markers today and arrange phone follow-up next week.   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   Gangrene of toe of right foot ([place]) 04/14/2021   Thoracic outlet syndrome 07/12/2020   Shoulder pain 05/29/2020   Neck pain 05/06/2020   Colon cancer screening 05/05/2020   Encounter for screening mammogram for malignant neoplasm of breast 05/05/2020   Chest pain 02/24/2020   Strain of lumbar paraspinal muscle, initial encounter 09/10/2019   Hand pain, right 03/14/2019   Diabetes ([place]) 07/26/2018   Long term current use of opiate analgesic 01/31/2017   Opioid dependence ([place]) 11/27/2016   [place] neuroma 11/02/2016   Broken teeth 11/02/2016   Hyperlipidemia associated with type 2 diabetes mellitus ([place]) 04/22/2016   Severe obesity (BMI 35.0-39.9) 01/22/2016   GERD (gastroesophageal reflux disease) 03/03/2014   Health care maintenance 01/06/2014   Neuropathy in diabetes ([place]) 11/01/2010   ASTHMA 11/01/2010   Hypertension associated with diabetes ([place]) 11/28/2008    Patient's Medications  New Prescriptions   No medications on file  Previous Medications   ATORVASTATIN (LIPITOR) 40 MG TABLET    Take 1 tablet (40 mg total) by mouth daily.   BLOOD GLUCOSE METER KIT AND SUPPLIES KIT    Dispense based on patient and insurance preference. Use up to four times daily as directed. (FOR ICD-9 250.00, 250.01).   CIPROFLOXACIN (CIPRO) 750 MG TABLET     Take 750 mg by mouth 2 (two) times daily.   DICLOFENAC SODIUM (VOLTAREN) 1 % GEL    Apply 4 g topically 4 (four) times daily.   GABAPENTIN (NEURONTIN) 300 MG CAPSULE    Take 3 capsules (900 mg total) by mouth 3 (three) times daily.   GLUCOSE BLOOD TEST STRIP    USE TO CHECK BLOOD SUGAR 4 TIMES DAILY.   HYDROCHLOROTHIAZIDE (HYDRODIURIL) 25 MG TABLET    Take 1 tablet (25 mg total) by mouth daily.   INSULIN GLARGINE (LANTUS) 100 UNIT/ML SOLOSTAR PEN    Inject 70 Units into the skin at bedtime.   INSULIN LISPRO (HUMALOG) 100 UNIT/ML KWIKPEN    Inject 18 Units into the skin 3 (three) times daily with meals.   LANCETS MISC    1 each by Does not apply route 4 (four) times daily -  before meals and at bedtime.   LISINOPRIL (ZESTRIL) 5 MG TABLET    Take 1 tablet (5 mg total) by mouth daily.   METFORMIN (GLUCOPHAGE-XR) 500 MG 24 HR TABLET    Take 2 tablets (1,000 mg total) by mouth 2 (two) times daily with a meal.   MULTIPLE VITAMIN (MULTIVITAMIN WITH MINERALS) TABS TABLET    Take 1 tablet by mouth every evening.    OMEPRAZOLE (PRILOSEC) 20 MG CAPSULE    Take 1 capsule (20 mg total) by mouth daily.  Modified Medications   No medications on file  Discontinued Medications   No medications on file    HPI:  [name] is a 60 y.o. female with diabetes and peripheral neuropathy.  I treated her for diabetic foot infection and osteomyelitis in 2019.  She inspects her feet daily.  In mid May she noted a little bit of moisture at the very tip of her right great toe prompting her to see her PCP on 04/14/2021.  She had thickened callus around the toe and a very small open ulcer plantar surface.  She says that she had diffuse swelling of her toe, foot and ankle.  She was admitted to the hospital the following day and underwent an MRI which showed early cute osteomyelitis involving the distal tuft of her right great toe.  Her ESR was only slightly elevated at 27.  Her C-reactive protein was normal at 1.  She was  discharged on 04/15/2021 on oral ciprofloxacin which she has taken ever since.Marland Kitchen  She was seen at the wound center by Dr. Heber [place] on 04/29/2021.  She describes nonpurulent drainage from the ulcer with good granulation tissue and no necrotic tissue.  She says that she is doing better.  The swelling of her foot and ankle has resolved.  She is not having as much tenderness around her toe.  She has not had any problems tolerating ciprofloxacin.  Review of Systems: Review of Systems  Constitutional:  Negative for chills and fever.  Gastrointestinal:  Negative for abdominal pain, diarrhea, nausea and vomiting.  Musculoskeletal:  Positive for joint pain.     Past Medical History:  Diagnosis Date   Anemia    Asthma 11/01/2010   BPPV (benign paroxysmal positional vertigo) 03/07/2013   Diabetes mellitus    since 1983   Elevated TSH 05/13/2013   GERD (gastroesophageal reflux disease)    Hypertension    Left elbow pain 05/05/2016   Left shoulder pain 05/05/2016   Osteomyelitis ([place])    Pneumonia    as a child   Wrist pain 04/11/2013    Social History   Tobacco Use   Smoking status: Former    Pack years: 0.00    Types: Cigarettes    Quit date: 11/28/1962    Years since quitting: 58.5   Smokeless tobacco: Never  Vaping Use   Vaping Use: Never used  Substance Use Topics   Alcohol use: No    Alcohol/week: 0.0 standard drinks   Drug use: No    Family History  Problem Relation Age of Onset   Diabetes Father    Hypertension Father    Hypertension Mother    Dementia Mother    Diabetes Daughter    Allergies  Allergen Reactions   Aspirin Nausea And Vomiting   Ibuprofen Nausea And Vomiting and Other (See Comments)    Abdominal pain   Penicillins Other (See Comments)    Unknown Did it involve swelling of the face/tongue/throat, SOB, or low BP? Unk Did it involve sudden or severe rash/hives, skin peeling, or any reaction on the inside of your mouth or nose? Unk  Did you need to seek medical  attention at a hospital or doctor's office? Unk When did it last happen? Childhood      If all above answers are "NO", may proceed with cephalosporin use.     OBJECTIVE: Vitals:   05/18/21 1504  BP: 137/72  Pulse: 94  Resp: 15  SpO2: 99%  Weight: 295 lb (133.8 kg)  Height: '6\' 1"'  (1.854 m)   Body mass index is 38.92 kg/m.   Physical Exam Constitutional:  Comments: She is in good spirits.  Cardiovascular:     Rate and Rhythm: Normal rate.  Pulmonary:     Effort: Pulmonary effort is normal.  Musculoskeletal:     Comments: She has dark, thickened callus around the tip of her right great toe.  She has a tiny superficial ulcer on the plantar surface.  There is no surrounding cellulitis, warmth, fluctuance or drainage.       Microbiology: No results found for this or any previous visit (from the past 240 hour(s)).  Michel Bickers, MD [facility] [phone] pager   [phone] cell 05/18/2021, 3:28 PM

## 2021-05-19 LAB — C-REACTIVE PROTEIN: CRP: 6.6 mg/L (ref <8.0)

## 2021-05-19 LAB — SEDIMENTATION RATE: Sed Rate: 29 mm/h (ref 0–30)

## 2021-05-20 ENCOUNTER — Encounter (HOSPITAL_BASED_OUTPATIENT_CLINIC_OR_DEPARTMENT_OTHER): Payer: Medicaid Other | Admitting: Internal Medicine

## 2021-05-20 ENCOUNTER — Other Ambulatory Visit: Payer: Self-pay

## 2021-05-20 DIAGNOSIS — E11621 Type 2 diabetes mellitus with foot ulcer: Secondary | ICD-10-CM

## 2021-05-20 DIAGNOSIS — L97519 Non-pressure chronic ulcer of other part of right foot with unspecified severity: Secondary | ICD-10-CM

## 2021-05-21 NOTE — Progress Notes (Addendum)
[name], [name] ([id number]) Visit Report for 05/20/2021 Chief Complaint Document Details Patient Name: Date of Service: [name], [name] 05/20/2021 10:00 A M Medical Record Number: [id number] Patient Account Number: [id number] Date of Birth/Sex: Treating RN: 15-Jun-1961 (60 y.o. [name], [name] Primary Care Provider: Wallis Bamberg TIE Other Clinician: Referring Provider: Treating Provider/Extender: Irena Reichmann, KA TIE Weeks in Treatment: 3 Information Obtained from: Patient Chief Complaint wound to the distal portion of the right great toe Electronic Signature(s) Signed: 05/20/2021 11:12:17 AM By: Kalman Shan DO Entered By: Kalman Shan on 05/20/2021 11:00:21 -------------------------------------------------------------------------------- Debridement Details Patient Name: Date of Service: [name], [name] 05/20/2021 10:00 A M Medical Record Number: [id number] Patient Account Number: [id number] Date of Birth/Sex: Treating RN: [birth year] (61 y.o. [name], [name] Primary Care Provider: Wallis Bamberg TIE Other Clinician: Referring Provider: Treating Provider/Extender: Irena Reichmann, KA TIE Weeks in Treatment: 3 Debridement Performed for Assessment: Wound #13 Right,Posterior T Great oe Performed By: Physician Kalman Shan, DO Debridement Type: Debridement Severity of Tissue Pre Debridement: Fat layer exposed Level of Consciousness (Pre-procedure): Awake and Alert Pre-procedure Verification/Time Out Yes - 10:45 Taken: Start Time: 10:45 Pain Control: Lidocaine T Area Debrided (L x W): otal 0.3 (cm) x 0.5 (cm) = 0.15 (cm) Tissue and other material debrided: Viable, Non-Viable, Callus, Subcutaneous, Skin: Dermis , Skin: Epidermis Level: Skin/Subcutaneous Tissue Debridement Description: Excisional Instrument: Curette Bleeding: Minimum Hemostasis Achieved: Pressure End Time: 10:46 Procedural Pain: 0 Post Procedural Pain: 0 Response to  Treatment: Procedure was tolerated well Level of Consciousness (Post- Awake and Alert procedure): Post Debridement Measurements of Total Wound Length: (cm) 0.3 Width: (cm) 0.5 Depth: (cm) 0.1 Volume: (cm) 0.012 Character of Wound/Ulcer Post Debridement: Improved Severity of Tissue Post Debridement: Fat layer exposed Post Procedure Diagnosis Same as Pre-procedure Electronic Signature(s) Signed: 05/20/2021 11:12:17 AM By: Kalman Shan DO Signed: 05/21/2021 6:18:32 PM By: Rhae Hammock RN Entered By: Rhae Hammock on 05/20/2021 10:46:28 -------------------------------------------------------------------------------- HPI Details Patient Name: Date of Service: [name], [name] 05/20/2021 10:00 A M Medical Record Number: [id number] Patient Account Number: [id number] Date of Birth/Sex: Treating RN: 07-02-1961 (60 y.o. [name], [name] Primary Care Provider: Wallis Bamberg TIE Other Clinician: Referring Provider: Treating Provider/Extender: Kalman Shan MA STERS, KA TIE Weeks in Treatment: 3 History of Present Illness Location: notice some discharge and callus on the right big toe Quality: Patient reports experiencing a dull pain to affected area(s). Severity: Patient states wound(s) are getting worse. Duration: Patient has had the wound for < 2 weeks prior to presenting for treatment Timing: Pain in wound is Intermittent (comes and goes Context: The wound occurred when the patient was cutting her toenails and noticed some discharge from this area and was very concerned about it. Modifying Factors: Patient wound(s)/ulcer(s) are improving due to:he has seen her PCP who put her on Bactrim and Keflex antibiotic ssociated Signs and Symptoms: Patient reports having decrease discharge she started on her antibiotic. A HPI Description: [name] is a 60 year old female with a past medical history of uncontrolled type 2 diabetes on insulin, osteomyelitis with amputations  of 1st and 2nd toes of her left foot that presents to clinic for wound care to the right great toe wound. She states she noticed the wound 3 weeks ago. Not quite sure how it started. She has been using silver alginate to the area. She was recently hospitalized from a clinic visit in the internal medicine clinic for this issue. While hospitalized she had an  MRI of the right foot that showed likely early acute osteomyelitis with possible septic arthritis. She was discharged on ciprofloxacin for 28 days. She currently does not deny pain increased warmth or erythema to the area. She has some drainage but not purulent. 6/9; patient presents for 1 week follow-up. She has no complaints today and denies signs of infection. She missed her infectious disease appointment yesterday but has it rescheduled for the 21st. She has been using silver alginate to the wound. 6/23; patient presents for 2-week follow-up. She followed up with Dr. Megan Salon, infectious disease 2 days ago. She had some blood work done and was told to finish ciprofloxacin. She is having a follow-up virtual encounter next week. There has been an increase in callus development. She is unable to use her offloading shoe due to having to walk for long periods due to needing public transportation. She continues to use silver alginate to the wound. She denies signs of infection. Electronic Signature(s) Signed: 05/20/2021 11:12:17 AM By: Kalman Shan DO Entered By: Kalman Shan on 05/20/2021 11:02:14 -------------------------------------------------------------------------------- Physical Exam Details Patient Name: Date of Service: [name], [name] 05/20/2021 10:00 A M Medical Record Number: [id number] Patient Account Number: [id number] Date of Birth/Sex: Treating RN: November 11, 1961 (60 y.o. [name], [name] Primary Care Provider: Wallis Bamberg TIE Other Clinician: Referring Provider: Treating Provider/Extender: Kalman Shan MA STERS,  KA TIE Weeks in Treatment: 3 Constitutional respirations regular, non-labored and within target range for patient.Marland Kitchen Psychiatric pleasant and cooperative. Notes Right great toe: Posterior aspect with small open wound surrounded by nonviable tissue and callus. Post debridement there is granulation tissue present However there continues to be significant callus. This does not probe to bone. No obvious signs of infection. Electronic Signature(s) Signed: 05/20/2021 11:12:17 AM By: Kalman Shan DO Entered By: Kalman Shan on 05/20/2021 11:03:58 -------------------------------------------------------------------------------- Physician Orders Details Patient Name: Date of Service: [name], [name] 05/20/2021 10:00 A M Medical Record Number: [id number] Patient Account Number: [id number] Date of Birth/Sex: Treating RN: Jun 23, 1961 (60 y.o. 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 - Dr. Heber Bogue Return A Bathing/ Shower/ Hygiene May shower with protection but do not get wound dressing(s) wet. - May use cast wrap for protection Edema Control - Lymphedema / SCD / Other Elevate legs to the level of the heart or above for 30 minutes daily and/or when sitting, a frequency of: - 3-4 times throughout the day. Avoid standing for long periods of time. Off-Loading Wedge shoe to: - Patient to wear while standing and walking. Wound Treatment Wound #13 - T Great oe Wound Laterality: Right,  Posterior Cleanser: Wound Cleanser (Generic) Every Other Day/15 Days Discharge Instructions: Cleanse the wound with wound cleanser prior to applying a clean dressing using gauze sponges, not tissue or cotton balls. Prim Dressing: KerraCel Ag Gelling Fiber Dressing, 4x5 in (silver alginate) (Generic) Every Other Day/15 Days ary Discharge Instructions: Apply silver alginate to wound bed as instructed Secondary Dressing: Woven Gauze Sponge, Non-Sterile 4x4 in (Generic) Every Other Day/15 Days Discharge Instructions: Apply over primary dressing as directed. Secondary Dressing: Optifoam Non-Adhesive Dressing,  4x4 in (Generic) Every Other Day/15 Days Discharge Instructions: **Foam donut***Apply over primary dressing as directed. Secured With: Child psychotherapist, Sterile 2x75 (in/in) (Generic) Every Other Day/15 Days Discharge Instructions: Secure with stretch gauze as directed. Secured With: 41M Medipore H Soft Cloth Surgical Tape, 2x2 (in/yd) (Generic) Every Other Day/15 Days Discharge Instructions: Secure dressing with tape as directed. Electronic Signature(s) Signed: 05/20/2021 11:12:17 AM By: Kalman Shan DO Entered By: Kalman Shan on 05/20/2021 11:06:13 -------------------------------------------------------------------------------- Problem List Details Patient Name: Date of Service: [name], [name] 05/20/2021 10:00 A M Medical Record Number: [id number] Patient Account Number: [id number] Date of Birth/Sex: Treating RN: Aug 03, 1961 (60 y.o. [name], [name] Primary Care Provider: Wallis Bamberg TIE Other Clinician: Referring Provider: Treating Provider/Extender: Irena Reichmann, KA TIE Weeks in Treatment: 3 Active Problems ICD-10 Encounter Code Description Active Date MDM Diagnosis E11.621 Type 2 diabetes mellitus with foot ulcer 04/29/2021 No Yes M86.171 Other acute osteomyelitis, right ankle and foot 04/29/2021 No Yes L97.519 Non-pressure chronic ulcer  of other part of right foot with unspecified severity 04/29/2021 No Yes E11.65 Type 2 diabetes mellitus with hyperglycemia 04/29/2021 No Yes Inactive Problems Resolved Problems Electronic Signature(s) Signed: 05/20/2021 11:12:17 AM By: Kalman Shan DO Entered By: Kalman Shan on 05/20/2021 10:59:48 -------------------------------------------------------------------------------- Progress Note/History and Physical Details Patient Name: Date of Service: [name], [name] 05/20/2021 10:00 [place] Record Number: [id number] Patient Account Number: [id number] Date of Birth/Sex: Treating RN: Jul 12, 1961 (60 y.o. [name], [name] Primary Care Provider: Wallis Bamberg TIE Other Clinician: Referring Provider: Treating Provider/Extender: Irena Reichmann, KA TIE Weeks in Treatment: 3 Subjective Chief Complaint Information obtained from Patient wound to the distal portion of the right great toe History of Present Illness (HPI) The following HPI elements were documented for the patient's wound: Location: notice some discharge and callus on the right big toe Quality: Patient reports experiencing a dull pain to affected area(s). Severity: Patient states wound(s) are getting worse. Duration: Patient has had the wound for < 2 weeks prior to presenting for treatment Timing: Pain in wound is Intermittent (comes and goes Context: The wound occurred when the patient was cutting her toenails and noticed some discharge from this area and was very concerned about it. Modifying Factors: Patient wound(s)/ulcer(s) are improving due to:he has seen her PCP who put her on Bactrim and Keflex antibiotic Associated Signs and Symptoms: Patient reports having decrease discharge she started on her antibiotic. Ms. Jeannie Mallinger is a 60 year old female with a past medical history of uncontrolled type 2 diabetes on insulin, osteomyelitis with amputations of 1st and 2nd toes of her left foot that presents to  clinic for wound care to the right great toe wound. She states she noticed the wound 3 weeks ago. Not quite sure how it started. She has been using silver alginate to the area. She was recently hospitalized from a clinic visit in the internal medicine clinic for this issue. While hospitalized she had an MRI of the right foot that showed likely early acute osteomyelitis with possible septic arthritis. She was discharged on ciprofloxacin for 28 days. She currently does not deny pain increased warmth or erythema to the area. She has some drainage but not purulent. 6/9; patient presents for 1 week follow-up. She has no complaints today and denies signs of infection. She missed her infectious disease appointment yesterday but has it rescheduled for the 21st. She has been using silver alginate to the wound. 6/23; patient presents for 2-week follow-up.  She followed up with Dr. Megan Salon, infectious disease 2 days ago. She had some blood work done and was told to finish ciprofloxacin. She is having a follow-up virtual encounter next week. There has been an increase in callus development. She is unable to use her offloading shoe due to having to walk for long periods due to needing public transportation. She continues to use silver alginate to the wound. She denies signs of infection. Patient History Information obtained from Patient. Family History Diabetes - Father,Child, Hypertension - Mother,Father, No family history of Cancer, Heart Disease, Hereditary Spherocytosis, Kidney Disease, Lung Disease, Seizures, Stroke, Thyroid Problems, Tuberculosis. Social History Never smoker, Marital Status - Divorced, Alcohol Use - Never, Drug Use - No History, Caffeine Use - Rarely - soda. Medical History Eyes Denies history of Cataracts, Glaucoma, Optic Neuritis Ear/Nose/Mouth/Throat Denies history of Chronic sinus problems/congestion, Middle ear problems Hematologic/Lymphatic Patient has history of  Anemia Denies history of Hemophilia, Human Immunodeficiency Virus, Lymphedema, Sickle Cell Disease Respiratory Patient has history of Asthma Denies history of Aspiration, Chronic Obstructive Pulmonary Disease (COPD), Pneumothorax, Sleep Apnea, Tuberculosis Cardiovascular Patient has history of Hypertension Denies history of Angina, Arrhythmia, Congestive Heart Failure, Coronary Artery Disease, Deep Vein Thrombosis, Hypotension, Myocardial Infarction, Peripheral Arterial Disease, Peripheral Venous Disease, Phlebitis, Vasculitis Gastrointestinal Denies history of Cirrhosis , Colitis, Crohnoos, Hepatitis A, Hepatitis B, Hepatitis C Endocrine Patient has history of Type II Diabetes Denies history of Type I Diabetes Genitourinary Denies history of End Stage Renal Disease Immunological Denies history of Lupus Erythematosus, Raynaudoos, Scleroderma Integumentary (Skin) Denies history of History of Burn Musculoskeletal Patient has history of Osteomyelitis - hx left great toe Denies history of Gout, Rheumatoid Arthritis, Osteoarthritis Neurologic Patient has history of Neuropathy Denies history of Dementia, Quadriplegia, Paraplegia, Seizure Disorder Oncologic Denies history of Received Chemotherapy, Received Radiation Psychiatric Patient has history of Confinement Anxiety Denies history of Anorexia/bulimia Patient is treated with Insulin, Oral Agents. Blood sugar is tested. Blood sugar results noted at the following times: Lunch - 120-160. Hospitalization/Surgery History - left second toe amputation. - left great toe amputation. - c-section x4. Medical A Surgical History Notes nd Gastrointestinal GERD Objective Constitutional respirations regular, non-labored and within target range for patient.. Vitals Time Taken: 10:44 AM, Temperature: 98.6 F, Pulse: 90 bpm, Respiratory Rate: 17 breaths/min, Blood Pressure: 121/77 mmHg. Psychiatric pleasant and cooperative. General Notes:  Right great toe: Posterior aspect with small open wound surrounded by nonviable tissue and callus. Post debridement there is granulation tissue present However there continues to be significant callus. This does not probe to bone. No obvious signs of infection. Integumentary (Hair, Skin) Wound #13 status is Open. Original cause of wound was Gradually Appeared. The date acquired was: 04/05/2021. The wound has been in treatment 3 weeks. The wound is located on the [place]. The wound measures 0.3cm length x 0.5cm width x 0.1cm depth; 0.118cm^2 area and 0.012cm^3 volume. oe There is no tunneling or undermining noted. There is a medium amount of sanguinous drainage noted. The wound margin is well defined and not attached to the wound base. There is no granulation within the wound bed. There is no necrotic tissue within the wound bed. General Notes: entire region covered in callous - measure is of lighter spot within the callous field. 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 Megan Salon on 6/21 and he recommended that she finish her last 3 days of ciprofloxacin (total of 28 days). He repeated inflammatory markers and her CRP and sed rate are within normal limits now. It appears that no further treatment will be needed. We will continue with aggressive wound care. There was nonviable tissue and callus that was debrided in office. I recommended continuing to use silver alginate. We discussed aggressive offloading. She is unable to use an offloading shoe when she walks. She may benefit from a cast in the near future if she does not show improvement. Procedures Wound #13 Pre-procedure  diagnosis of Wound #13 is a Diabetic Wound/Ulcer of the Lower Extremity located on the [place] .Severity of Tissue Pre oe Debridement is: Fat layer exposed. There was a Excisional Skin/Subcutaneous Tissue Debridement with a total area of 0.15 sq cm performed by Kalman Shan, DO. With the following instrument(s): Curette to remove Viable and Non-Viable tissue/material. Material removed includes Callus, Subcutaneous Tissue, Skin: Dermis, and Skin: Epidermis after achieving pain control using Lidocaine. No specimens were taken. A time out was conducted at 10:45, prior to the start of the procedure. A Minimum amount of bleeding was controlled with Pressure. The procedure was tolerated well with a pain level of 0 throughout and a pain level of 0 following the procedure. Post Debridement Measurements: 0.3cm length x 0.5cm width x 0.1cm depth; 0.012cm^3 volume. Character of Wound/Ulcer Post Debridement is improved. Severity of Tissue Post Debridement is: Fat layer exposed. Post procedure Diagnosis Wound #13: Same as Pre-Procedure Plan Follow-up Appointments: Return Appointment in 1 week. - Dr. Heber Norco Bathing/ Shower/ Hygiene: May shower with protection but do not get wound dressing(s) wet. - May use cast wrap for protection Edema Control - Lymphedema / SCD / Other: Elevate legs to the level of the heart or above for 30 minutes daily and/or when sitting, a frequency of: - 3-4 times throughout the day. Avoid standing for long periods of time. Off-Loading: Wedge shoe to: - Patient to wear while standing and walking. WOUND #13: - T Great Wound Laterality: Right, Posterior oe Cleanser: Wound Cleanser (Generic) Every Other Day/15 Days Discharge Instructions: Cleanse the wound with wound cleanser prior to applying a clean dressing using gauze sponges, not tissue or cotton balls. Prim Dressing: KerraCel Ag Gelling Fiber Dressing, 4x5 in (silver alginate) (Generic) Every Other Day/15  Days ary Discharge Instructions: Apply silver alginate to wound bed as instructed Secondary Dressing: Woven Gauze Sponge, Non-Sterile 4x4 in (Generic) Every Other Day/15 Days Discharge Instructions: Apply over primary dressing as directed. Secondary Dressing: Optifoam Non-Adhesive Dressing, 4x4 in (Generic) Every Other Day/15 Days Discharge Instructions: **Foam donut***Apply over primary dressing as directed. Secured With: Child psychotherapist, Sterile 2x75 (in/in) (Generic) Every Other Day/15 Days Discharge Instructions: Secure with stretch gauze as directed. Secured With: 49M Medipore H Soft Cloth Surgical T ape, 2x2 (in/yd) (Generic) Every Other Day/15 Days Discharge Instructions: Secure dressing with tape as directed. 1. In office sharp debridement 2. Silver alginate 3. Follow-up in 1 week Electronic Signature(s) Signed: 05/20/2021 11:12:17 AM By: Kalman Shan DO Entered By: Kalman Shan on 05/20/2021 11:11:45 -------------------------------------------------------------------------------- HxROS Details Patient Name: Date of Service: [name], [name] 05/20/2021 10:00 A M Medical Record Number: [id number] Patient Account Number: [id number] Date of Birth/Sex: Treating RN: [birth year] (60 y.o. [name], [name] Primary Care Provider: Wallis Bamberg TIE Other Clinician: Referring Provider: Treating Provider/Extender: Kalman Shan  MA STERS, KA TIE Weeks in Treatment: 3 Label Progress Note Print Version as History and Physical for this encounter Information Obtained From Patient Eyes Medical History: Negative for: Cataracts; Glaucoma; Optic Neuritis Ear/Nose/Mouth/Throat Medical History: Negative for: Chronic sinus problems/congestion; Middle ear problems Hematologic/Lymphatic Medical History: Positive for: Anemia Negative for: Hemophilia; Human Immunodeficiency Virus; Lymphedema; Sickle Cell Disease Respiratory Medical History: Positive for:  Asthma Negative for: Aspiration; Chronic Obstructive Pulmonary Disease (COPD); Pneumothorax; Sleep Apnea; Tuberculosis Cardiovascular Medical History: Positive for: Hypertension Negative for: Angina; Arrhythmia; Congestive Heart Failure; Coronary Artery Disease; Deep Vein Thrombosis; Hypotension; Myocardial Infarction; Peripheral Arterial Disease; Peripheral Venous Disease; Phlebitis; Vasculitis Gastrointestinal Medical History: Negative for: Cirrhosis ; Colitis; Crohns; Hepatitis A; Hepatitis B; Hepatitis C Past Medical History Notes: GERD Endocrine Medical History: Positive for: Type II Diabetes Negative for: Type I Diabetes Time with diabetes: 89 years Treated with: Insulin, Oral agents Blood sugar tested every day: Yes Tested : 2 times per day Blood sugar testing results: Lunch: 120-160 Genitourinary Medical History: Negative for: End Stage Renal Disease Immunological Medical History: Negative for: Lupus Erythematosus; Raynauds; Scleroderma Integumentary (Skin) Medical History: Negative for: History of Burn Musculoskeletal Medical History: Positive for: Osteomyelitis - hx left great toe Negative for: Gout; Rheumatoid Arthritis; Osteoarthritis Neurologic Medical History: Positive for: Neuropathy Negative for: Dementia; Quadriplegia; Paraplegia; Seizure Disorder Oncologic Medical History: Negative for: Received Chemotherapy; Received Radiation Psychiatric Medical History: Positive for: Confinement Anxiety Negative for: Anorexia/bulimia Immunizations Pneumococcal Vaccine: Received Pneumococcal Vaccination: Yes Immunization Notes: pt. 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 c-section x4 Family and Social History Cancer: No; Diabetes: Yes - Father,Child; Heart Disease: No; Hereditary Spherocytosis: No; Hypertension: Yes - Mother,Father; Kidney  Disease: No; Lung Disease: No; Seizures: No; Stroke: No; Thyroid Problems: No; Tuberculosis: No; Never smoker; Marital Status - Divorced; Alcohol Use: Never; Drug Use: No History; Caffeine Use: Rarely - soda; Financial Concerns: No; Food, Clothing or Shelter Needs: No; Support System Lacking: No; Transportation Concerns: No Electronic Signature(s) Signed: 05/20/2021 11:12:17 AM By: Kalman Shan DO Signed: 05/21/2021 6:18:32 PM By: Rhae Hammock RN Entered By: Kalman Shan on 05/20/2021 11:02:23 -------------------------------------------------------------------------------- SuperBill Details Patient Name: Date of Service: [name], [name] 05/20/2021 Medical Record Number: [id number] Patient Account Number: [id number] Date of Birth/Sex: Treating RN: [birth year] (60 y.o. 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 E11.65 Type 2 diabetes mellitus with hyperglycemia Facility Procedures CPT4 Code: 17001749 Description: 44967 - DEB SUBQ TISSUE 20 SQ CM/< ICD-10 Diagnosis Description E11.621 Type 2 diabetes mellitus with foot ulcer 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

## 2021-05-24 ENCOUNTER — Other Ambulatory Visit (HOSPITAL_COMMUNITY): Payer: Self-pay

## 2021-05-24 MED FILL — Lisinopril Tab 5 MG: ORAL | 30 days supply | Qty: 30 | Fill #1 | Status: AC

## 2021-05-24 MED FILL — Metformin HCl Tab ER 24HR 500 MG: ORAL | 30 days supply | Qty: 120 | Fill #1 | Status: AC

## 2021-05-24 MED FILL — Gabapentin Cap 300 MG: ORAL | 30 days supply | Qty: 270 | Fill #1 | Status: AC

## 2021-05-24 MED FILL — Atorvastatin Calcium Tab 40 MG (Base Equivalent): ORAL | 30 days supply | Qty: 30 | Fill #1 | Status: AC

## 2021-05-26 ENCOUNTER — Other Ambulatory Visit: Payer: Self-pay

## 2021-05-26 ENCOUNTER — Encounter: Payer: Self-pay | Admitting: Internal Medicine

## 2021-05-26 ENCOUNTER — Encounter: Payer: Self-pay | Admitting: Pharmacist

## 2021-05-26 ENCOUNTER — Telehealth (INDEPENDENT_AMBULATORY_CARE_PROVIDER_SITE_OTHER): Payer: Medicaid Other | Admitting: Internal Medicine

## 2021-05-26 DIAGNOSIS — M869 Osteomyelitis, unspecified: Secondary | ICD-10-CM

## 2021-05-26 DIAGNOSIS — E1169 Type 2 diabetes mellitus with other specified complication: Secondary | ICD-10-CM | POA: Diagnosis not present

## 2021-05-26 NOTE — Progress Notes (Deleted)
Subjective:    [name], female    DOB: [birth year], 60 y.o.   MRN: [id number]  HPI Patient is a 60 y.o. female who presents for diabetes management. She is in good spirits and presents {w-w/o:315700} assistance. Patient was referred on *** and last seen by Primary Care Provider on ***  PMH significant for ***.   Patient reports diabetes was diagnosed in ***.   Insurance coverage/medication affordability: ***  Family/Social history: ***  Current diabetes medications include: *** Current diabetes medications include: *** Current hypertension medications include: *** Current hyperlipidemia medications include: *** Patient states that {He/she (caps):30048} {Is/is not:9024} taking {his/her/their:21314} diabetes medications as prescribed. Patient {Actions; denies-reports:120008} adherence with medications. Patient states that {He/she (caps):30048} misses {his/her/their:21314} medications *** times per week, on average.  Do you feel that your medications are working for you?  {YES NO:22349}  Have you been experiencing any side effects to the medications prescribed? {YES NO:22349}  Do you have any problems obtaining medications due to transportation or finances?  {YES [id number]     Patient reported dietary habits:  Eats *** meals/day and *** snacks/day; [place] with *** meals/day and *** snacks/day Breakfast:*** Lunch:*** Dinner:*** Snacks:*** Drinks:***  Patient-reported exercise habits: ***   Patient {Actions; denies-reports:120008} hypoglycemic events. Patient {Actions; denies-reports:120008} polyuria (increased urination).  Patient {Actions; denies-reports:120008} polyphagia (increased appetite).  Patient {Actions; denies-reports:120008} polydipsia (increased thirst).  Patient {Actions; denies-reports:120008} neuropathy (nerve pain). Patient {Actions; denies-reports:120008} visual changes. Patient {Actions; denies-reports:120008} self foot exams.   Home  fasting blood sugars: ***  2 hour post-meal/random blood sugars: ***  Objective:   Labs:   Physical Exam  ROS  Lab Results  Component Value Date   HGBA1C 9.6 (A) 04/14/2021   HGBA1C 9.4 (A) 05/06/2020   HGBA1C 10.1 (H) 02/24/2020    There were no vitals filed for this visit.  Lab Results  Component Value Date   MICRALBCREAT 8.5 10/09/2017    Lipid Panel     Component Value Date/Time   CHOL 225 (H) 02/25/2020 0334   TRIG 188 (H) 02/25/2020 0334   HDL 64 02/25/2020 0334   CHOLHDL 3.5 02/25/2020 0334   VLDL 38 02/25/2020 0334   LDLCALC 123 (H) 02/25/2020 0334    Clinical Atherosclerotic Cardiovascular Disease (ASCVD): {YES/NO:21197} The 10-year ASCVD risk score Mikey Bussing DC Jr., et al., 2013) is: 17.9%   Values used to calculate the score:     Age: 68 years     Sex: Female     Is Non-Hispanic African American: Yes     Diabetic: Yes     Tobacco smoker: No     Systolic Blood Pressure: 017 mmHg     Is BP treated: Yes     HDL Cholesterol: 64 mg/dL     Total Cholesterol: 225 mg/dL   PHQ-9 Score: ***  Assessment/Plan:   ***T1/T2DM {ACTION; IS/IS CBS:[id number]} controlled likely due to ***. Medication adherence appears ***. Additional pharmacotherapy {ACTION; IS/IS BWG:[id number]}. Patient with a history of intolerance to ***. Will initiate ***. Benefits of medication include ***. Patient educated on purpose, proper use and potential adverse effects of ***.  Following instruction patient verbalized understanding of treatment plan.    {Meds adjust:18428} basal insulin *** (insulin ***). Patient will continue to titrate 1 unit every *** days if fasting blood sugar > 100mg /dl until fasting blood sugars reach goal or next visit. {Meds adjust:18428}  rapid insulin *** (insulin ***) to ***.  {Meds adjust:18428} GLP-1 *** (generic name***) to ***.  {Meds adjust:18428}  SGLT2-I *** (generic name***) to ***. Counseled on sick day rules for ***. Extensively discussed pathophysiology of  diabetes, dietary effects on blood sugar control, and recommended lifestyle interventions,  Patient will adhere to dietary modifications Patient will exercise *** with goal to increase towards target of at least 150 minutes of moderate intensity exercise weekly Counseled on s/sx of and management of hypoglycemia Next A1C anticipated ***.   ASCVD risk - primary***secondary prevention in patient with diabetes. Last LDL {Is/is not:9024} controlled. ASCVD risk score {Is/is not:9024} >20%  - {Desc; low/moderate/high:110033} intensity statin indicated. Aspirin {Is/is not:9024} indicated.   {Meds adjust:18428} aspirin *** mg  {Meds adjust:18428} ***statin *** mg.   Hypertension longstanding*** currently ***.  Blood pressure goal = *** mmHg. Medication adherence ***.  Blood pressure control is suboptimal due to ***.  *** Extensively discussed pathophysiology of blood pressure, dietary effects on blood pressure control, and recommended lifestyle interventions  Follow-up appointment *** to review sugar readings. Written patient instructions provided.  This appointment required *** minutes of patient care (this includes precharting, chart review, review of results, and face-to-face care).  Thank you for involving pharmacy to assist in providing this patient's care.  Patient seen with ***

## 2021-05-26 NOTE — Progress Notes (Signed)
Virtual Visit via Telephone Note  I connected with [name] on 05/26/21 at  9:30 AM EDT by telephone and verified that I am speaking with the correct person using two identifiers.  Location: Patient: Home Provider: [facility]   I discussed the limitations, risks, security and privacy concerns of performing an evaluation and management service by telephone and the availability of in person appointments. I also discussed with the patient that there may be a patient responsible charge related to this service. The patient expressed understanding and agreed to proceed.   History of Present Illness: I called and spoke with [name] this morning.  She completed 4 weeks of oral ciprofloxacin last week as treatment for possible osteomyelitis of her right great toe.  She is feeling better.  She says that she is not having any discomfort in her toe.  She has no unusual redness or swelling and she has had no drainage from the very small plantar ulcer.   Observations/Objective: Sed Rate  Date Value  05/18/2021 29 mm/h  04/14/2021 27 mm/hr (H)  07/27/2018 63 mm/hr (H)   CRP  Date Value  05/18/2021 6.6 mg/L  04/14/2021 1.0 mg/dL (H)  07/27/2018 13.3 mg/dL (H)  06/25/2018 7 mg/L     Assessment and Plan: She had a very good clinical response to empiric ciprofloxacin and her inflammatory markers have returned to normal.  I am hopeful that her infection has been cured.  Follow Up Instructions: Continue observation off of antibiotics Follow-up here in 1 month   I discussed the assessment and treatment plan with the patient. The patient was provided an opportunity to ask questions and all were answered. The patient agreed with the plan and demonstrated an understanding of the instructions.   The patient was advised to call back or seek an in-person evaluation if the symptoms worsen or if the condition fails to improve as anticipated.  I provided 14 minutes of non-face-to-face time during this  encounter.   Michel Bickers, MD

## 2021-05-27 ENCOUNTER — Encounter (HOSPITAL_BASED_OUTPATIENT_CLINIC_OR_DEPARTMENT_OTHER): Payer: Medicaid Other | Admitting: Internal Medicine

## 2021-06-04 ENCOUNTER — Encounter (HOSPITAL_BASED_OUTPATIENT_CLINIC_OR_DEPARTMENT_OTHER): Payer: Medicaid Other | Attending: Internal Medicine | Admitting: Internal Medicine

## 2021-06-04 ENCOUNTER — Other Ambulatory Visit: Payer: Self-pay

## 2021-06-04 DIAGNOSIS — L97519 Non-pressure chronic ulcer of other part of right foot with unspecified severity: Secondary | ICD-10-CM

## 2021-06-04 DIAGNOSIS — Z8249 Family history of ischemic heart disease and other diseases of the circulatory system: Secondary | ICD-10-CM | POA: Insufficient documentation

## 2021-06-04 DIAGNOSIS — E1151 Type 2 diabetes mellitus with diabetic peripheral angiopathy without gangrene: Secondary | ICD-10-CM | POA: Insufficient documentation

## 2021-06-04 DIAGNOSIS — E1140 Type 2 diabetes mellitus with diabetic neuropathy, unspecified: Secondary | ICD-10-CM | POA: Diagnosis not present

## 2021-06-04 DIAGNOSIS — E1165 Type 2 diabetes mellitus with hyperglycemia: Secondary | ICD-10-CM | POA: Diagnosis not present

## 2021-06-04 DIAGNOSIS — Z794 Long term (current) use of insulin: Secondary | ICD-10-CM | POA: Insufficient documentation

## 2021-06-04 DIAGNOSIS — Z833 Family history of diabetes mellitus: Secondary | ICD-10-CM | POA: Insufficient documentation

## 2021-06-04 DIAGNOSIS — M86171 Other acute osteomyelitis, right ankle and foot: Secondary | ICD-10-CM | POA: Diagnosis not present

## 2021-06-04 DIAGNOSIS — E11621 Type 2 diabetes mellitus with foot ulcer: Secondary | ICD-10-CM | POA: Diagnosis not present

## 2021-06-08 NOTE — Progress Notes (Signed)
[name], [name] ([id number]) Visit Report for 06/04/2021 Chief Complaint Document Details Patient Name: Date of Service: [name], [name] 06/04/2021 10:15 A M Medical Record Number: [id number] Patient Account Number: [id number] Date of Birth/Sex: Treating RN: [birth year] (60 y.o. Nancy Fetter Primary Care Provider: Wallis Bamberg TIE Other Clinician: Referring Provider: Treating Provider/Extender: Ned Card in Treatment: 5 Information Obtained from: Patient Chief Complaint wound to the distal portion of the right great toe Electronic Signature(s) Signed: 06/04/2021 5:12:49 PM By: Kalman Shan DO Entered By: Kalman Shan on 06/04/2021 17:05:35 -------------------------------------------------------------------------------- Debridement Details Patient Name: Date of Service: [name], [name] 06/04/2021 10:15 A M Medical Record Number: [id number] Patient Account Number: [id number] Date of Birth/Sex: Treating RN: [birth year] (60 y.o. Nancy Fetter Primary Care Provider: Wallis Bamberg TIE Other Clinician: Referring Provider: Treating Provider/Extender: Ned Card in Treatment: 5 Debridement Performed for Assessment: Wound #13 Right,Posterior T Great oe Performed By: Physician Kalman Shan, DO Debridement Type: Debridement Severity of Tissue Pre Debridement: Fat layer exposed Level of Consciousness (Pre-procedure): Awake and Alert Pre-procedure Verification/Time Out Yes - 11:25 Taken: Start Time: 11:25 T Area Debrided (L x W): otal 0.3 (cm) x 0.3 (cm) = 0.09 (cm) Tissue and other material debrided: Viable, Non-Viable, Callus, Subcutaneous Level: Skin/Subcutaneous Tissue Debridement Description: Excisional Instrument: Curette Bleeding: Minimum Hemostasis Achieved: Pressure End Time: 11:27 Procedural Pain: 0 Post Procedural Pain: 0 Response to Treatment: Procedure was tolerated well Level of Consciousness  (Post- Awake and Alert procedure): Post Debridement Measurements of Total Wound Length: (cm) 0.3 Width: (cm) 0.3 Depth: (cm) 0.2 Volume: (cm) 0.014 Character of Wound/Ulcer Post Debridement: Improved Severity of Tissue Post Debridement: Fat layer exposed Post Procedure Diagnosis Same as Pre-procedure Electronic Signature(s) Signed: 06/04/2021 5:12:49 PM By: Kalman Shan DO Signed: 06/08/2021 5:43:47 PM By: Levan Hurst RN, BSN Entered By: Levan Hurst on 06/04/2021 11:26:07 -------------------------------------------------------------------------------- HPI Details Patient Name: Date of Service: [name], [name] 06/04/2021 10:15 A M Medical Record Number: [id number] Patient Account Number: [id number] Date of Birth/Sex: Treating RN: Oct 31, 1961 (60 y.o. Nancy Fetter Primary Care Provider: Wallis Bamberg TIE Other Clinician: Referring Provider: Treating Provider/Extender: Ned Card in Treatment: 5 History of Present Illness Location: notice some discharge and callus on the right big toe Quality: Patient reports experiencing a dull pain to affected area(s). Severity: Patient states wound(s) are getting worse. Duration: Patient has had the wound for < 2 weeks prior to presenting for treatment Timing: Pain in wound is Intermittent (comes and goes Context: The wound occurred when the patient was cutting her toenails and noticed some discharge from this area and was very concerned about it. Modifying Factors: Patient wound(s)/ulcer(s) are improving due to:he has seen her PCP who put her on Bactrim and Keflex antibiotic ssociated Signs and Symptoms: Patient reports having decrease discharge she started on her antibiotic. A HPI Description: Ms. Alazia Crocket is a 60 year old female with a past medical history of uncontrolled type 2 diabetes on insulin, osteomyelitis with amputations of 1st and 2nd toes of her left foot that presents to clinic for wound  care to the right great toe wound. She states she noticed the wound 3 weeks ago. Not quite sure how it started. She has been using silver alginate to the area. She was recently hospitalized from a clinic visit in the internal medicine clinic for this issue. While hospitalized she had an MRI of the right foot that showed likely early acute osteomyelitis with possible  septic arthritis. She was discharged on ciprofloxacin for 28 days. She currently does not deny pain increased warmth or erythema to the area. She has some drainage but not purulent. 6/9; patient presents for 1 week follow-up. She has no complaints today and denies signs of infection. She missed her infectious disease appointment yesterday but has it rescheduled for the 21st. She has been using silver alginate to the wound. 6/23; patient presents for 2-week follow-up. She followed up with Dr. Megan Salon, infectious disease 2 days ago. She had some blood work done and was told to finish ciprofloxacin. She is having a follow-up virtual encounter next week. There has been an increase in callus development. She is unable to use her offloading shoe due to having to walk for long periods due to needing public transportation. She continues to use silver alginate to the wound. She denies signs of infection. 7/8; patient presents for follow-up. She missed her appointment last week. She reports increased callus to the surrounding wound area. She continues to use silver alginate. She denies signs of infection. Electronic Signature(s) Signed: 06/04/2021 5:12:49 PM By: Kalman Shan DO Entered By: Kalman Shan on 06/04/2021 17:06:23 -------------------------------------------------------------------------------- Physical Exam Details Patient Name: Date of Service: [name], [name] 06/04/2021 10:15 A M Medical Record Number: [id number] Patient Account Number: [id number] Date of Birth/Sex: Treating RN: [birth year] (60 y.o. Nancy Fetter Primary  Care Provider: Wallis Bamberg TIE Other Clinician: Referring Provider: Treating Provider/Extender: Ned Card in Treatment: 5 Constitutional respirations regular, non-labored and within target range for patient.Marland Kitchen Psychiatric pleasant and cooperative. Notes Right great toe: Posterior aspect with small open wound surrounded by nonviable tissue and callus. Post debridement there is granulation tissue present However there continues to be significant callus. This does not probe to bone. No obvious signs of infection. Electronic Signature(s) Signed: 06/04/2021 5:12:49 PM By: Kalman Shan DO Entered By: Kalman Shan on 06/04/2021 17:07:24 -------------------------------------------------------------------------------- Physician Orders Details Patient Name: Date of Service: [name], [name] 06/04/2021 10:15 A M Medical Record Number: [id number] Patient Account Number: [id number] Date of Birth/Sex: Treating RN: 12/07/1960 (60 y.o. 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 - Dr. Heber Mingo Return A Bathing/ Shower/ Hygiene May shower with protection but do not get wound dressing(s) wet. - May use cast wrap for protection Edema Control - Lymphedema / SCD / Other Elevate legs to the level of the heart or above for 30 minutes daily and/or when sitting, a frequency of: - 3-4 times throughout the day. Avoid standing for long periods of time. Off-Loading Wedge shoe to:  - Patient to wear while standing and walking. Wound Treatment Wound #13 - T Great oe Wound Laterality: Right, Posterior Cleanser: Wound Cleanser (Generic) Every Other Day/15 Days Discharge Instructions: Cleanse the wound with wound cleanser prior to applying a clean dressing using gauze sponges, not tissue or cotton balls. Prim Dressing: KerraCel Ag Gelling Fiber Dressing, 4x5 in (silver alginate) (Generic) Every Other Day/15 Days ary Discharge Instructions: Apply silver alginate to wound bed as instructed Secondary Dressing: Woven Gauze Sponge, Non-Sterile 4x4 in (Generic) Every Other Day/15  Days Discharge Instructions: Apply over primary dressing as directed. Secondary Dressing: Optifoam Non-Adhesive Dressing, 4x4 in (Generic) Every Other Day/15 Days Discharge Instructions: **Foam donut***Apply over primary dressing as directed. Secured With: Child psychotherapist, Sterile 2x75 (in/in) (Generic) Every Other Day/15 Days Discharge Instructions: Secure with stretch gauze as directed. Secured With: 71M Medipore H Soft Cloth Surgical Tape, 2x2 (in/yd) (Generic) Every Other Day/15 Days Discharge Instructions: Secure dressing with tape as directed. Electronic Signature(s) Signed: 06/04/2021 5:12:49 PM By: Kalman Shan DO Entered By: Kalman Shan on 06/04/2021 17:07:34 -------------------------------------------------------------------------------- Problem List Details Patient Name: Date of Service: [name], [name] 06/04/2021 10:15 A M Medical Record Number: [id number] Patient Account Number: [id number] Date of Birth/Sex: Treating RN: September 18, 1961 (60 y.o. 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 Nancy Fetter Primary Care Provider: Wallis Bamberg TIE Other Clinician: Referring Provider: Treating Provider/Extender: Ned Card in Treatment: 5 Subjective Chief Complaint Information obtained from Patient wound to the distal portion of the right great toe History of Present Illness (HPI) The following HPI elements were documented for the patient's wound: Location: notice some discharge and callus on the right big toe Quality: Patient reports experiencing a dull pain to affected area(s). Severity: Patient states wound(s) are getting worse. Duration: Patient has had the wound for < 2 weeks prior to presenting for treatment Timing: Pain in wound is Intermittent (comes and goes Context: The wound occurred when the patient was cutting her toenails and noticed some discharge from this area and was very concerned about it. Modifying Factors: Patient wound(s)/ulcer(s) are improving due to:he has seen her PCP who put her on Bactrim and Keflex antibiotic Associated Signs and Symptoms: Patient reports having decrease discharge she started on her antibiotic. Ms. Mylea Roarty is a 60 year old female with a past medical history of uncontrolled  type 2 diabetes on insulin, osteomyelitis with amputations of 1st and 2nd toes of her left foot that presents to clinic for wound care to the right great toe wound. She states she noticed the wound 3 weeks ago. Not quite sure how it started. She has been using silver alginate to the area. She was recently hospitalized from a clinic visit in the internal medicine clinic for this issue. While hospitalized she had an MRI of the right foot that showed likely early acute osteomyelitis with possible septic arthritis. She was discharged on ciprofloxacin for 28 days. She currently does not deny pain increased warmth or erythema to the area. She has some drainage but not purulent. 6/9; patient presents for 1 week follow-up. She has no complaints today and denies signs of infection. She missed her infectious disease appointment yesterday but has it rescheduled for the 21st. She has been using silver  alginate to the wound. 6/23; patient presents for 2-week follow-up. She followed up with Dr. Megan Salon, infectious disease 2 days ago. She had some blood work done and was told to finish ciprofloxacin. She is having a follow-up virtual encounter next week. There has been an increase in callus development. She is unable to use her offloading shoe due to having to walk for long periods due to needing public transportation. She continues to use silver alginate to the wound. She denies signs of infection. 7/8; patient presents for follow-up. She missed her appointment last week. She reports increased callus to the surrounding wound area. She continues to use silver alginate. She denies signs of infection. Patient History Information obtained from Patient. Family History Diabetes - Father,Child, Hypertension - Mother,Father, No family history of Cancer, Heart Disease, Hereditary Spherocytosis, Kidney Disease, Lung Disease, Seizures, Stroke, Thyroid Problems, Tuberculosis. Social History Never smoker, Marital Status -  Divorced, Alcohol Use - Never, Drug Use - No History, Caffeine Use - Rarely - soda. Medical History Eyes Denies history of Cataracts, Glaucoma, Optic Neuritis Ear/Nose/Mouth/Throat Denies history of Chronic sinus problems/congestion, Middle ear problems Hematologic/Lymphatic Patient has history of Anemia Denies history of Hemophilia, Human Immunodeficiency Virus, Lymphedema, Sickle Cell Disease Respiratory Patient has history of Asthma Denies history of Aspiration, Chronic Obstructive Pulmonary Disease (COPD), Pneumothorax, Sleep Apnea, Tuberculosis Cardiovascular Patient has history of Hypertension Denies history of Angina, Arrhythmia, Congestive Heart Failure, Coronary Artery Disease, Deep Vein Thrombosis, Hypotension, Myocardial Infarction, Peripheral Arterial Disease, Peripheral Venous Disease, Phlebitis, Vasculitis Gastrointestinal Denies history of Cirrhosis , Colitis, Crohnoos, Hepatitis A, Hepatitis B, Hepatitis C Endocrine Patient has history of Type II Diabetes Denies history of Type I Diabetes Genitourinary Denies history of End Stage Renal Disease Immunological Denies history of Lupus Erythematosus, Raynaudoos, Scleroderma Integumentary (Skin) Denies history of History of Burn Musculoskeletal Patient has history of Osteomyelitis - hx left great toe Denies history of Gout, Rheumatoid Arthritis, Osteoarthritis Neurologic Patient has history of Neuropathy Denies history of Dementia, Quadriplegia, Paraplegia, Seizure Disorder Oncologic Denies history of Received Chemotherapy, Received Radiation Psychiatric Patient has history of Confinement Anxiety Denies history of Anorexia/bulimia Patient is treated with Insulin, Oral Agents. Blood sugar is tested. Blood sugar results noted at the following times: Lunch - 120-160. Hospitalization/Surgery History - left second toe amputation. - left great toe amputation. - c-section x4. Medical A Surgical History  Notes nd Gastrointestinal GERD Objective Constitutional respirations regular, non-labored and within target range for patient.. Vitals Time Taken: 10:48 AM, Temperature: 98.4 F, Pulse: 89 bpm, Respiratory Rate: 18 breaths/min, Blood Pressure: 123/80 mmHg. Psychiatric pleasant and cooperative. General Notes: Right great toe: Posterior aspect with small open wound surrounded by nonviable tissue and callus. Post debridement there is granulation tissue present However there continues to be significant callus. This does not probe to bone. No obvious signs of infection. Integumentary (Hair, Skin) Wound #13 status is Open. Original cause of wound was Gradually Appeared. The date acquired was: 04/05/2021. The wound has been in treatment 5 weeks. The wound is located on the [place]. The wound measures 0.3cm length x 0.3cm width x 0.2cm depth; 0.071cm^2 area and 0.014cm^3 volume. oe There is Fat Layer (Subcutaneous Tissue) exposed. There is no tunneling or undermining noted. There is a medium amount of sanguinous drainage noted. The wound margin is well defined and not attached to the wound base. There is medium (34-66%) red granulation within the wound bed. There is no necrotic tissue within the wound bed. 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 Nonviable tissue was also debrided and there was good granulation tissue present. I recommended continuing silver alginate and doing her best to offload this area. She had a video visit with Dr. Megan Salon on 6/29. Inflammatory markers have returned to normal and he is hopeful that her possible osteo in her right great toe has been  cured. I will see her back in 1 week Procedures Wound #13 Pre-procedure diagnosis of Wound #13 is a Diabetic Wound/Ulcer of the Lower Extremity located on the [place] .Severity of Tissue Pre oe Debridement is: Fat layer exposed. There was a Excisional Skin/Subcutaneous Tissue Debridement with a total area of 0.09 sq cm performed by Kalman Shan, DO. With the following instrument(s): Curette to remove Viable and Non-Viable tissue/material. Material removed includes Callus and Subcutaneous Tissue and. No specimens were taken. A time out was conducted at 11:25, prior to the start of the procedure. A Minimum amount of bleeding was controlled with Pressure. The procedure was tolerated well with a pain level of 0 throughout and a pain level of 0 following the procedure. Post Debridement Measurements: 0.3cm length x 0.3cm width x 0.2cm depth; 0.014cm^3 volume. Character of Wound/Ulcer Post Debridement is improved. Severity of Tissue Post Debridement is: Fat layer exposed. Post procedure Diagnosis Wound #13: Same as Pre-Procedure Plan Follow-up Appointments: Return Appointment in 1 week. - Dr. Heber [place] Bathing/ Shower/ Hygiene: May shower with protection but do not get wound dressing(s) wet. - May use cast wrap for protection Edema Control - Lymphedema / SCD / Other: Elevate legs to the level of the heart or above for 30 minutes daily and/or when sitting, a frequency of: - 3-4 times throughout the day. Avoid standing for long periods of time. Off-Loading: Wedge shoe to: - Patient to wear while standing and walking. WOUND #13: - T Great Wound Laterality: Right, Posterior oe Cleanser: Wound Cleanser (Generic) Every Other Day/15 Days Discharge Instructions: Cleanse the wound with wound cleanser prior to applying a clean dressing using gauze sponges, not tissue or cotton balls. Prim Dressing: KerraCel Ag Gelling Fiber Dressing, 4x5 in (silver alginate) (Generic) Every Other Day/15  Days ary Discharge Instructions: Apply silver alginate to wound bed as instructed Secondary Dressing: Woven Gauze Sponge, Non-Sterile 4x4 in (Generic) Every Other Day/15 Days Discharge Instructions: Apply over primary dressing as directed. Secondary Dressing: Optifoam Non-Adhesive Dressing, 4x4 in (Generic) Every Other Day/15 Days Discharge Instructions: **Foam donut***Apply over primary dressing as directed. Secured With: Child psychotherapist, Sterile 2x75 (in/in) (Generic) Every Other Day/15 Days Discharge Instructions: Secure with stretch gauze as directed. Secured With: 8M Medipore H Soft Cloth Surgical T ape, 2x2 (in/yd) (Generic) Every Other Day/15 Days Discharge Instructions: Secure dressing with tape as directed. 1. In office sharp debridement 2. Continue silver alginate and offloading the area 3. Follow-up in 1 week Electronic Signature(s) Signed: 06/04/2021 5:12:49 PM By: Kalman Shan DO Entered By: Kalman Shan on 06/04/2021 17:09:37 -------------------------------------------------------------------------------- HxROS Details Patient Name: Date of Service: [name], [name] 06/04/2021 10:15 A M Medical Record Number: [id number] Patient Account Number: [id number] Date of Birth/Sex: Treating RN: May 01, 1961 (60 y.o. Nancy Fetter Primary Care Provider: Wallis Bamberg TIE Other Clinician: Referring Provider: Treating Provider/Extender: Ned Card in Treatment: 5 Label  Progress Note Print Version as History and Physical for this encounter Information Obtained From Patient Eyes Medical History: Negative for: Cataracts; Glaucoma; Optic Neuritis Ear/Nose/Mouth/Throat Medical History: Negative for: Chronic sinus problems/congestion; Middle ear problems Hematologic/Lymphatic Medical History: Positive for: Anemia Negative for: Hemophilia; Human Immunodeficiency Virus; Lymphedema; Sickle Cell Disease Respiratory Medical  History: Positive for: Asthma Negative for: Aspiration; Chronic Obstructive Pulmonary Disease (COPD); Pneumothorax; Sleep Apnea; Tuberculosis Cardiovascular Medical History: Positive for: Hypertension Negative for: Angina; Arrhythmia; Congestive Heart Failure; Coronary Artery Disease; Deep Vein Thrombosis; Hypotension; Myocardial Infarction; Peripheral Arterial Disease; Peripheral Venous Disease; Phlebitis; Vasculitis Gastrointestinal Medical History: Negative for: Cirrhosis ; Colitis; Crohns; Hepatitis A; Hepatitis B; Hepatitis C Past Medical History Notes: GERD Endocrine Medical History: Positive for: Type II Diabetes Negative for: Type I Diabetes Time with diabetes: 44 years Treated with: Insulin, Oral agents Blood sugar tested every day: Yes Tested : 2 times per day Blood sugar testing results: Lunch: 120-160 Genitourinary Medical History: Negative for: End Stage Renal Disease Immunological Medical History: Negative for: Lupus Erythematosus; Raynauds; Scleroderma Integumentary (Skin) Medical History: Negative for: History of Burn Musculoskeletal Medical History: Positive for: Osteomyelitis - hx left great toe Negative for: Gout; Rheumatoid Arthritis; Osteoarthritis Neurologic Medical History: Positive for: Neuropathy Negative for: Dementia; Quadriplegia; Paraplegia; Seizure Disorder Oncologic Medical History: Negative for: Received Chemotherapy; Received Radiation Psychiatric Medical History: Positive for: Confinement Anxiety Negative for: Anorexia/bulimia Immunizations Pneumococcal Vaccine: Received Pneumococcal Vaccination: Yes Immunization Notes: pt. 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 c-section x4 Family and Social History Cancer: No; Diabetes: Yes - Father,Child; Heart Disease: No; Hereditary Spherocytosis: No; Hypertension: Yes -  Mother,Father; Kidney Disease: No; Lung Disease: No; Seizures: No; Stroke: No; Thyroid Problems: No; Tuberculosis: No; Never smoker; Marital Status - Divorced; Alcohol Use: Never; Drug Use: No History; Caffeine Use: Rarely - soda; Financial Concerns: No; Food, Clothing or Shelter Needs: No; Support System Lacking: No; Transportation Concerns: No Electronic Signature(s) Signed: 06/04/2021 5:12:49 PM By: Kalman Shan DO Signed: 06/08/2021 5:43:47 PM By: Levan Hurst RN, BSN Entered By: Kalman Shan on 06/04/2021 17:06:42 -------------------------------------------------------------------------------- [place] Details Patient Name: Date of Service: [name], [name] 06/04/2021 Medical Record Number: [id number] Patient Account Number: [id number] Date of Birth/Sex: Treating RN: [birth year] (60 y.o. 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 E11.65 Type 2 diabetes mellitus with hyperglycemia Facility Procedures CPT4 Code: 91638466 Description: 59935 - DEB SUBQ TISSUE 20 SQ CM/< ICD-10 Diagnosis Description E11.621 Type 2 diabetes mellitus with foot ulcer 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 M86.171 Other acute osteomyelitis, right  ankle and foot E11.65 Type 2 diabetes mellitus with hyperglycemia Modifier: ty Quantity: 1 Physician Procedures : CPT4 Code Description Modifier [id number] 90300 - WC PHYS SUBQ TISS 20 SQ CM ICD-10 Diagnosis Description E11.621 Type 2 diabetes mellitus with foot ulcer 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

## 2021-06-08 NOTE — Progress Notes (Signed)
Debra, Rivera ([id number]) Visit Report for 06/04/2021 Arrival Information Details Patient Name: Date of Service: [name], [name] 06/04/2021 10:15 A M Medical Record Number: [id number] Patient Account Number: [id number] Date of Birth/Sex: Treating RN: 19-Apr-1961 (60 y.o. [name] Primary Care Alvah Gilder: [place] TIE Other Clinician: Referring Sheli Dorin: Treating Kamuela Magos/Extender: Ned Card in Treatment: 5 Visit Information History Since Last Visit Added or deleted any medications: No Patient Arrived: Ambulatory Any new allergies or adverse reactions: No Arrival Time: 10:43 Had a fall or experienced change in No Transfer Assistance: None activities of daily living that may affect Patient Identification Verified: Yes risk of falls: Secondary Verification Process Completed: Yes Signs or symptoms of abuse/neglect since last visito No Patient Requires Transmission-Based Precautions: No Hospitalized since last visit: No Patient Has Alerts: Yes Implantable device outside of the clinic excluding No Patient Alerts: ABI R=1.18 cellular tissue based products placed in the center ABI L=1.1 since last visit: Has Dressing in Place as Prescribed: Yes Pain Present Now: No Electronic Signature(s) Signed: 06/04/2021 5:49:30 PM By: Lorrin Jackson Entered By: Lorrin Jackson on 06/04/2021 10:46:02 -------------------------------------------------------------------------------- Lower Extremity Assessment Details Patient Name: Date of Service: [name], [name] 06/04/2021 10:15 A M Medical Record Number: [id number] Patient Account Number: [id number] Date of Birth/Sex: Treating RN: 02-Jul-1961 (60 y.o. [name] Primary Care Stepheni Cameron: [place] TIE Other Clinician: Referring Garnett Nunziata: Treating Elaysia Devargas/Extender: Ned Card in Treatment: 5 Edema Assessment Assessed: [Left: Yes] [Right: No] Edema: [Left: N] [Right:  o] Calf Left: Right: Point of Measurement: 27 cm From Medial Instep 38 cm Ankle Left: Right: Point of Measurement: 9 cm From Medial Instep 22 cm Vascular Assessment Pulses: Dorsalis Pedis Palpable: [Left:Yes] Electronic Signature(s) Signed: 06/04/2021 5:49:30 PM By: Lorrin Jackson Entered By: Lorrin Jackson on 06/04/2021 10:54:24 -------------------------------------------------------------------------------- Multi Wound Chart Details Patient Name: Date of Service: [name], [name] 06/04/2021 10:15 A M Medical Record Number: [id number] Patient Account Number: [id number] Date of Birth/Sex: Treating RN: 11-Mar-1961 (60 y.o. Nancy Fetter Primary Care Micha Erck: [place] TIE Other Clinician: Referring Dorea Duff: Treating Collier Bohnet/Extender: Ned Card in Treatment: 5 Vital Signs Height(in): Pulse(bpm): 28 Weight(lbs): Blood Pressure(mmHg): 123/80 Body Mass Index(BMI): Temperature(F): 98.4 Respiratory Rate(breaths/min): 18 Photos: [N/A:N/A] Right, Posterior T Great oe N/A N/A Wound Location: Gradually Appeared N/A N/A Wounding Event: Diabetic Wound/Ulcer of the Lower N/A N/A Primary Etiology: Extremity Anemia, Asthma, Hypertension, Type N/A N/A Comorbid History: II Diabetes, Osteomyelitis, Neuropathy, Confinement Anxiety 04/05/2021 N/A N/A Date Acquired: 5 N/A N/A Weeks of Treatment: Open N/A N/A Wound Status: 0.3x0.3x0.2 N/A N/A Measurements L x W x D (cm) 0.071 N/A N/A A (cm) : rea 0.014 N/A N/A Volume (cm) : -51.10% N/A N/A % Reduction in A rea: 0.00% N/A N/A % Reduction in Volume: Grade 1 N/A N/A Classification: Medium N/A N/A Exudate A mount: Sanguinous N/A N/A Exudate Type: red N/A N/A Exudate Color: Well defined, not attached N/A N/A Wound Margin: Medium (34-66%) N/A N/A Granulation A mount: Red N/A N/A Granulation Quality: None Present (0%) N/A N/A Necrotic A mount: Fat Layer (Subcutaneous Tissue):  Yes N/A N/A Exposed Structures: Fascia: No Tendon: No Muscle: No Joint: No Bone: No None N/A N/A Epithelialization: Debridement - Excisional N/A N/A Debridement: Pre-procedure Verification/Time Out 11:25 N/A N/A Taken: Callus, Subcutaneous N/A N/A Tissue Debrided: Skin/Subcutaneous Tissue N/A N/A Level: 0.09 N/A N/A Debridement A (sq cm): rea Curette N/A N/A Instrument: Minimum N/A N/A Bleeding: Pressure N/A N/A Hemostasis  Achieved: 0 N/A N/A Procedural Pain: 0 N/A N/A Post Procedural Pain: Procedure was tolerated well N/A N/A Debridement Treatment Response: 0.3x0.3x0.2 N/A N/A Post Debridement Measurements L x W x D (cm) 0.014 N/A N/A Post Debridement Volume: (cm) Calloused periwound N/A N/A Assessment Notes: Debridement N/A N/A Procedures Performed: Treatment Notes Electronic Signature(s) Signed: 06/04/2021 5:12:49 PM By: Kalman Shan DO Signed: 06/08/2021 5:43:47 PM By: Levan Hurst RN, BSN Entered By: Kalman Shan on 06/04/2021 17:05:22 -------------------------------------------------------------------------------- Multi-Disciplinary Care Plan Details Patient Name: Date of Service: [name], [name] 06/04/2021 10:15 A M Medical Record Number: [id number] Patient Account Number: [id number] Date of Birth/Sex: Treating RN: June 29, 1961 (60 y.o. Nancy Fetter Primary Care Aum Caggiano: [place] TIE Other Clinician: Referring Jennings Stirling: Treating Rianna Lukes/Extender: Ned Card in Treatment: 5 Active Inactive Nutrition Nursing Diagnoses: Impaired glucose control: actual or potential Potential for alteratiion in Nutrition/Potential for imbalanced nutrition Goals: Patient/caregiver agrees to and verbalizes understanding of need to obtain nutritional consultation Date Initiated: 05/06/2021 Date Inactivated: 06/04/2021 Target Resolution Date: 05/27/2021 Goal Status: Met Patient/caregiver will maintain therapeutic glucose  control Date Initiated: 05/06/2021 Target Resolution Date: 06/25/2021 Goal Status: Active Interventions: Assess HgA1c results as ordered upon admission and as needed Provide education on elevated blood sugars and impact on wound healing Provide education on nutrition Treatment Activities: Patient referred to Primary Care Physician for further nutritional evaluation : 05/06/2021 Notes: Wound/Skin Impairment Nursing Diagnoses: Knowledge deficit related to ulceration/compromised skin integrity Goals: Patient/caregiver will verbalize understanding of skin care regimen Date Initiated: 05/06/2021 Target Resolution Date: 06/18/2021 Goal Status: Active Interventions: Assess patient/caregiver ability to obtain necessary supplies Assess patient/caregiver ability to perform ulcer/skin care regimen upon admission and as needed Provide education on ulcer and skin care Treatment Activities: Skin care regimen initiated : 05/06/2021 Topical wound management initiated : 05/06/2021 Notes: Electronic Signature(s) Signed: 06/08/2021 5:43:47 PM By: Levan Hurst RN, BSN Entered By: Levan Hurst on 06/04/2021 11:27:41 -------------------------------------------------------------------------------- Pain Assessment Details Patient Name: Date of Service: [name], [name] 06/04/2021 10:15 A M Medical Record Number: [id number] Patient Account Number: [id number] Date of Birth/Sex: Treating RN: [birth year] (60 y.o. [name] Primary Care Juda Toepfer: [place] TIE Other Clinician: Referring Korey Arroyo: Treating Latavious Bitter/Extender: Ned Card in Treatment: 5 Active Problems Location of Pain Severity and Description of Pain Patient Has Paino No Site Locations Pain Management and Medication Current Pain Management: Electronic Signature(s) Signed: 06/04/2021 5:49:30 PM By: Lorrin Jackson Entered By: Lorrin Jackson on 06/04/2021  10:46:11 -------------------------------------------------------------------------------- Patient/Caregiver Education Details Patient Name: Date of Service: [name], [name] 7/8/2022andnbsp10:15 A M Medical Record Number: [id number] Patient Account Number: [id number] Date of Birth/Gender: Treating RN: 15-Dec-1960 (60 y.o. Nancy Fetter Primary Care Physician: [place] TIE Other Clinician: Referring Physician: Treating Physician/Extender: Ned Card in Treatment: 5 Education Assessment Education Provided To: Patient Education Topics Provided Wound/Skin Impairment: Methods: Explain/Verbal Responses: State content correctly Electronic Signature(s) Signed: 06/08/2021 5:43:47 PM By: Levan Hurst RN, BSN Entered By: Levan Hurst on 06/04/2021 11:27:55 -------------------------------------------------------------------------------- Wound Assessment Details Patient Name: Date of Service: [name], [name] 06/04/2021 10:15 A M Medical Record Number: [id number] Patient Account Number: [id number] Date of Birth/Sex: Treating RN: [birth year] (60 y.o. [name] Primary Care Montgomery Rothlisberger: [place] TIE Other Clinician: Referring Breeze Berringer: Treating Tanequa Kretz/Extender: Ned Card in Treatment: 5 Wound Status Wound Number: 13 Primary Diabetic Wound/Ulcer of the Lower Extremity Etiology: Wound Location: Right, Posterior T Great oe Wound Open Wounding Event:  Gradually Appeared Status: Date Acquired: 04/05/2021 Comorbid Anemia, Asthma, Hypertension, Type II Diabetes, Osteomyelitis, Weeks Of Treatment: 5 History: Neuropathy, Confinement Anxiety Clustered Wound: No Photos Wound Measurements Length: (cm) 0.3 Width: (cm) 0.3 Depth: (cm) 0.2 Area: (cm) 0.071 Volume: (cm) 0.014 % Reduction in Area: -51.1% % Reduction in Volume: 0% Epithelialization: None Tunneling: No Undermining: No Wound  Description Classification: Grade 1 Wound Margin: Well defined, not attached Exudate Amount: Medium Exudate Type: Sanguinous Exudate Color: red Foul Odor After Cleansing: No Slough/Fibrino No Wound Bed Granulation Amount: Medium (34-66%) Exposed Structure Granulation Quality: Red Fascia Exposed: No Necrotic Amount: None Present (0%) Fat Layer (Subcutaneous Tissue) Exposed: Yes Tendon Exposed: No Muscle Exposed: No Joint Exposed: No Bone Exposed: No Assessment Notes Calloused periwound Electronic Signature(s) Signed: 06/04/2021 5:16:50 PM By: Sandre Kitty Signed: 06/04/2021 5:49:30 PM By: Lorrin Jackson Entered By: Sandre Kitty on 06/04/2021 17:02:21 -------------------------------------------------------------------------------- Vitals Details Patient Name: Date of Service: [name], [name] 06/04/2021 10:15 A M Medical Record Number: [id number] Patient Account Number: [id number] Date of Birth/Sex: Treating RN: Feb 23, 1961 (60 y.o. [name] Primary Care Haji Delaine: [place] TIE Other Clinician: Referring Ameya Vowell: Treating Liseth Wann/Extender: Ned Card in Treatment: 5 Vital Signs Time Taken: 10:48 Temperature (F): 98.4 Pulse (bpm): 89 Respiratory Rate (breaths/min): 18 Blood Pressure (mmHg): 123/80 Reference Range: 80 - 120 mg / dl Electronic Signature(s) Signed: 06/04/2021 5:49:30 PM By: Lorrin Jackson Entered By: Lorrin Jackson on 06/04/2021 10:48:58

## 2021-06-10 ENCOUNTER — Other Ambulatory Visit: Payer: Self-pay

## 2021-06-10 ENCOUNTER — Encounter (HOSPITAL_BASED_OUTPATIENT_CLINIC_OR_DEPARTMENT_OTHER): Payer: Medicaid Other | Admitting: Internal Medicine

## 2021-06-10 DIAGNOSIS — M86171 Other acute osteomyelitis, right ankle and foot: Secondary | ICD-10-CM | POA: Diagnosis not present

## 2021-06-10 DIAGNOSIS — E11621 Type 2 diabetes mellitus with foot ulcer: Secondary | ICD-10-CM | POA: Diagnosis not present

## 2021-06-10 NOTE — Progress Notes (Signed)
[name], [name] ([id number]) Visit Report for 06/10/2021 Chief Complaint Document Details Patient Name: Date of Service: [name], [name] 06/10/2021 11:00 A M Medical Record Number: [id number] Patient Account Number: [id number] Date of Birth/Sex: Treating RN: [birth year] (60 y.o. [name], [name] Primary Care Provider: Wallis Bamberg TIE Other Clinician: Referring Provider: Treating Provider/Extender: Irena Reichmann, KA TIE Weeks in Treatment: 6 Information Obtained from: Patient Chief Complaint wound to the distal portion of the right great toe Electronic Signature(s) Signed: 06/10/2021 12:39:11 PM By: Kalman Shan DO Entered By: Kalman Shan on 06/10/2021 12:29:18 -------------------------------------------------------------------------------- Debridement Details Patient Name: Date of Service: [name], [name] 06/10/2021 11:00 A M Medical Record Number: [id number] Patient Account Number: [id number] Date of Birth/Sex: Treating RN: [birth year] (60 y.o. [name] Primary Care Provider: MA Iline Oven TIE Other Clinician: Referring Provider: Treating Provider/Extender: Irena Reichmann, KA TIE Weeks in Treatment: 6 Debridement Performed for Assessment: Wound #13 Right,Posterior T Great oe Performed By: Physician Kalman Shan, DO Debridement Type: Debridement Severity of Tissue Pre Debridement: Fat layer exposed Level of Consciousness (Pre-procedure): Awake and Alert Pre-procedure Verification/Time Out Yes - 10:52 Taken: Start Time: 10:53 Pain Control: Other : Benzocaine T Area Debrided (L x W): otal 0.2 (cm) x 0.3 (cm) = 0.06 (cm) Tissue and other material debrided: Non-Viable, Subcutaneous Level: Skin/Subcutaneous Tissue Debridement Description: Excisional Instrument: Curette Bleeding: Minimum Hemostasis Achieved: Pressure End Time: 10:58 Response to Treatment: Procedure was tolerated well Level of Consciousness (Post- Awake and  Alert procedure): Post Debridement Measurements of Total Wound Length: (cm) 0.2 Width: (cm) 0.3 Depth: (cm) 0.2 Volume: (cm) 0.009 Character of Wound/Ulcer Post Debridement: Stable Severity of Tissue Post Debridement: Fat layer exposed Post Procedure Diagnosis Same as Pre-procedure Electronic Signature(s) Signed: 06/10/2021 12:39:11 PM By: Kalman Shan DO Signed: 06/10/2021 6:01:46 PM By: Lorrin Jackson Entered By: Lorrin Jackson on 06/10/2021 10:58:37 -------------------------------------------------------------------------------- HPI Details Patient Name: Date of Service: [name], [name] 06/10/2021 11:00 A M Medical Record Number: [id number] Patient Account Number: [id number] Date of Birth/Sex: Treating RN: 08-10-1961 (59 y.o. [name], [name] Primary Care Provider: Wallis Bamberg TIE Other Clinician: Referring Provider: Treating Provider/Extender: Kalman Shan MA STERS, KA TIE Weeks in Treatment: 6 History of Present Illness Location: notice some discharge and callus on the right big toe Quality: Patient reports experiencing a dull pain to affected area(s). Severity: Patient states wound(s) are getting worse. Duration: Patient has had the wound for < 2 weeks prior to presenting for treatment Timing: Pain in wound is Intermittent (comes and goes Context: The wound occurred when the patient was cutting her toenails and noticed some discharge from this area and was very concerned about it. Modifying Factors: Patient wound(s)/ulcer(s) are improving due to:he has seen her PCP who put her on Bactrim and Keflex antibiotic ssociated Signs and Symptoms: Patient reports having decrease discharge she started on her antibiotic. A HPI Description: [name] is a 60 year old female with a past medical history of uncontrolled type 2 diabetes on insulin, osteomyelitis with amputations of 1st and 2nd toes of her left foot that presents to clinic for wound care to the right  great toe wound. She states she noticed the wound 3 weeks ago. Not quite sure how it started. She has been using silver alginate to the area. She was recently hospitalized from a clinic visit in the internal medicine clinic for this issue. While hospitalized she had an MRI of the right foot that showed likely early acute osteomyelitis with possible  septic arthritis. She was discharged on ciprofloxacin for 28 days. She currently does not deny pain increased warmth or erythema to the area. She has some drainage but not purulent. 6/9; patient presents for 1 week follow-up. She has no complaints today and denies signs of infection. She missed her infectious disease appointment yesterday but has it rescheduled for the 21st. She has been using silver alginate to the wound. 6/23; patient presents for 2-week follow-up. She followed up with Dr. Megan Salon, infectious disease 2 days ago. She had some blood work done and was told to finish ciprofloxacin. She is having a follow-up virtual encounter next week. There has been an increase in callus development. She is unable to use her offloading shoe due to having to walk for long periods due to needing public transportation. She continues to use silver alginate to the wound. She denies signs of infection. 7/8; patient presents for follow-up. She missed her appointment last week. She reports increased callus to the surrounding wound area. She continues to use silver alginate. She denies signs of infection. 7/14; patient presents for 1 week follow-up. She has been using silver alginate to the wound bed. She has no issues or complaints today. She denies signs of infection. Electronic Signature(s) Signed: 06/10/2021 12:39:11 PM By: Kalman Shan DO Entered By: Kalman Shan on 06/10/2021 12:30:12 -------------------------------------------------------------------------------- Physical Exam Details Patient Name: Date of Service: [name], [name] 06/10/2021  11:00 A M Medical Record Number: [id number] Patient Account Number: [id number] Date of Birth/Sex: Treating RN: [birth year] (60 y.o. [name], [name] Primary Care Provider: Wallis Bamberg TIE Other Clinician: Referring Provider: Treating Provider/Extender: Kalman Shan MA STERS, KA TIE Weeks in Treatment: 6 Constitutional respirations regular, non-labored and within target range for patient.. Cardiovascular 2+ dorsalis pedis/posterior tibialis pulses. Psychiatric Rivera and cooperative. Notes Right great toe: Posterior aspect with small open wound surrounded by nonviable tissue and callus. Post debridement there is granulation tissue present However there continues to be callus. This does not probe to bone. No obvious signs of infection. Electronic Signature(s) Signed: 06/10/2021 12:39:11 PM By: Kalman Shan DO Entered By: Kalman Shan on 06/10/2021 12:30:58 -------------------------------------------------------------------------------- Physician Orders Details Patient Name: Date of Service: [name], [name] 06/10/2021 11:00 [place] Record Number: [id number] Patient Account Number: [id number] Date of Birth/Sex: Treating RN: [birth year] (60 y.o. 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 - Dr. Heber [place] Return A Bathing/ Shower/ Hygiene May shower with protection but do not get wound dressing(s) wet. - May use cast wrap for protection Edema Control - Lymphedema / SCD /  Other Elevate legs to the level of the heart or above for 30 minutes daily and/or when sitting, a frequency of: - 3-4 times throughout the day. Avoid standing for long periods of time. Off-Loading Wedge shoe to: - Patient to wear while standing and walking. Wound Treatment Wound #13 - T Great oe Wound Laterality: Right, Posterior Cleanser: Wound Cleanser (Generic) Every Other Day/15 Days Discharge Instructions: Cleanse the wound with wound cleanser prior to applying a clean dressing using gauze sponges, not tissue or cotton balls. Prim  Dressing: KerraCel Ag Gelling Fiber Dressing, 4x5 in (silver alginate) (Generic) Every Other Day/15 Days ary Discharge Instructions: Apply silver alginate to wound bed as instructed Secondary Dressing: Woven Gauze Sponge, Non-Sterile 4x4 in (Generic) Every Other Day/15 Days Discharge Instructions: Apply over primary dressing as directed. Secondary Dressing: Optifoam Non-Adhesive Dressing, 4x4 in (Generic) Every Other Day/15 Days Discharge Instructions: **Foam donut***Apply over primary dressing as directed. Secured With: Child psychotherapist, Sterile 2x75 (in/in) (Generic) Every Other Day/15 Days Discharge Instructions: Secure with stretch gauze as directed. Secured With: 71M Medipore H Soft Cloth Surgical Tape, 2x2 (in/yd) (Generic) Every Other Day/15 Days Discharge Instructions: Secure dressing with tape as directed. Electronic Signature(s) Signed: 06/10/2021 12:39:11 PM By: Kalman Shan DO Entered By: Kalman Shan on 06/10/2021 12:31:28 -------------------------------------------------------------------------------- Problem List Details Patient Name: Date of Service: AIKAM, HELLICKSON 06/10/2021 11:00 A M Medical Record Number: [id number] Patient Account Number: [id number] Date of Birth/Sex: Treating RN: July 26, 1961 (60 y.o. 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 [name], [name] Primary Care Provider: Wallis Bamberg TIE Other Clinician: Referring Provider: Treating Provider/Extender: Irena Reichmann, KA TIE Weeks in Treatment: 6 Subjective Chief Complaint Information obtained from Patient wound to the distal portion of the right great toe History of Present Illness (HPI) The following HPI elements were documented for the patient's wound: Location: notice some discharge and callus on the right big toe Quality: Patient reports experiencing a dull pain to affected area(s). Severity: Patient states wound(s) are getting worse. Duration: Patient has had the wound for < 2 weeks prior to presenting for treatment Timing: Pain in wound is Intermittent (comes and goes Context: The wound occurred when the patient was cutting her toenails and noticed some discharge from this area and was very concerned about it. Modifying Factors: Patient wound(s)/ulcer(s) are improving due to:he has seen her PCP who put her on  Bactrim and Keflex antibiotic Associated Signs and Symptoms: Patient reports having decrease discharge she started on her antibiotic. Ms. Yanely Mast is a 60 year old female with a past medical history of uncontrolled type 2 diabetes on insulin, osteomyelitis with amputations of 1st and 2nd toes of her left foot that presents to clinic for wound care to the right great toe wound. She states she noticed the wound 3 weeks ago. Not quite sure how it started. She has been using silver alginate to the area. She was recently hospitalized from a clinic visit in the internal medicine clinic for this issue. While hospitalized she had an MRI of the right foot that showed likely early acute osteomyelitis with possible septic arthritis. She was discharged on ciprofloxacin for 28 days. She currently does not deny pain increased warmth or erythema to the area. She  has some drainage but not purulent. 6/9; patient presents for 1 week follow-up. She has no complaints today and denies signs of infection. She missed her infectious disease appointment yesterday but has it rescheduled for the 21st. She has been using silver alginate to the wound. 6/23; patient presents for 2-week follow-up. She followed up with Dr. Megan Salon, infectious disease 2 days ago. She had some blood work done and was told to finish ciprofloxacin. She is having a follow-up virtual encounter next week. There has been an increase in callus development. She is unable to use her offloading shoe due to having to walk for long periods due to needing public transportation. She continues to use silver alginate to the wound. She denies signs of infection. 7/8; patient presents for follow-up. She missed her appointment last week. She reports increased callus to the surrounding wound area. She continues to use silver alginate. She denies signs of infection. 7/14; patient presents for 1 week follow-up. She has been using silver alginate to the wound bed.  She has no issues or complaints today. She denies signs of infection. Patient History Information obtained from Patient. Family History Diabetes - Father,Child, Hypertension - Mother,Father, No family history of Cancer, Heart Disease, Hereditary Spherocytosis, Kidney Disease, Lung Disease, Seizures, Stroke, Thyroid Problems, Tuberculosis. Social History Never smoker, Marital Status - Divorced, Alcohol Use - Never, Drug Use - No History, Caffeine Use - Rarely - soda. Medical History Eyes Denies history of Cataracts, Glaucoma, Optic Neuritis Ear/Nose/Mouth/Throat Denies history of Chronic sinus problems/congestion, Middle ear problems Hematologic/Lymphatic Patient has history of Anemia Denies history of Hemophilia, Human Immunodeficiency Virus, Lymphedema, Sickle Cell Disease Respiratory Patient has history of Asthma Denies history of Aspiration, Chronic Obstructive Pulmonary Disease (COPD), Pneumothorax, Sleep Apnea, Tuberculosis Cardiovascular Patient has history of Hypertension Denies history of Angina, Arrhythmia, Congestive Heart Failure, Coronary Artery Disease, Deep Vein Thrombosis, Hypotension, Myocardial Infarction, Peripheral Arterial Disease, Peripheral Venous Disease, Phlebitis, Vasculitis Gastrointestinal Denies history of Cirrhosis , Colitis, Crohnoos, Hepatitis A, Hepatitis B, Hepatitis C Endocrine Patient has history of Type II Diabetes Denies history of Type I Diabetes Genitourinary Denies history of End Stage Renal Disease Immunological Denies history of Lupus Erythematosus, Raynaudoos, Scleroderma Integumentary (Skin) Denies history of History of Burn Musculoskeletal Patient has history of Osteomyelitis - hx left great toe Denies history of Gout, Rheumatoid Arthritis, Osteoarthritis Neurologic Patient has history of Neuropathy Denies history of Dementia, Quadriplegia, Paraplegia, Seizure Disorder Oncologic Denies history of Received Chemotherapy, Received  Radiation Psychiatric Patient has history of Confinement Anxiety Denies history of Anorexia/bulimia Patient is treated with Insulin, Oral Agents. Blood sugar is tested. Blood sugar results noted at the following times: Lunch - 120-160. Hospitalization/Surgery History - left second toe amputation. - left great toe amputation. - c-section x4. Medical A Surgical History Notes nd Gastrointestinal GERD Objective Constitutional respirations regular, non-labored and within target range for patient.. Vitals Time Taken: 10:42 AM, Temperature: 98.8 F, Pulse: 110 bpm, Respiratory Rate: 18 breaths/min, Blood Pressure: 135/81 mmHg. Cardiovascular 2+ dorsalis pedis/posterior tibialis pulses. Psychiatric Rivera and cooperative. General Notes: Right great toe: Posterior aspect with small open wound surrounded by nonviable tissue and callus. Post debridement there is granulation tissue present However there continues to be callus. This does not probe to bone. No obvious signs of infection. Integumentary (Hair, Skin) Wound #13 status is Open. Original cause of wound was Gradually Appeared. The date acquired was: 04/05/2021. The wound has been in treatment 6 weeks. The wound is located on the [place]. The wound measures  0.2cm length x 0.3cm width x 0.2cm depth; 0.047cm^2 area and 0.009cm^3 volume. oe There is Fat Layer (Subcutaneous Tissue) exposed. There is no tunneling or undermining noted. There is a medium amount of sanguinous drainage noted. The wound margin is well defined and not attached to the wound base. There is large (67-100%) pink, pale granulation within the wound bed. There is no necrotic tissue within the wound bed. 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 I debrided nonviable tissue. There were no signs of infection. I recommended continuing silver alginate. We will see her back in a week Procedures Wound #13 Pre-procedure diagnosis of Wound #13 is a Diabetic Wound/Ulcer of the Lower Extremity located on the [place] .Severity of Tissue Pre oe Debridement is: Fat layer exposed. There was a Excisional Skin/Subcutaneous Tissue Debridement with a total area of 0.06 sq cm performed by Kalman Shan, DO. With the following instrument(s): Curette to remove Non-Viable tissue/material. Material removed includes Subcutaneous Tissue after achieving pain control using Other (Benzocaine). No specimens were taken. A time out was conducted at 10:52, prior to the start of the procedure. A Minimum amount of bleeding was controlled with Pressure. The procedure was tolerated well. Post Debridement Measurements: 0.2cm length x 0.3cm width x 0.2cm depth; 0.009cm^3 volume. Character of Wound/Ulcer Post Debridement is stable. Severity of Tissue Post Debridement is: Fat layer exposed. Post procedure Diagnosis Wound #13: Same as Pre-Procedure Plan Follow-up Appointments: Return Appointment in 1 week. - Dr. Heber [place] Bathing/ Shower/ Hygiene: May shower with protection but do not get wound dressing(s) wet. - May use cast wrap for protection Edema Control - Lymphedema / SCD / Other: Elevate legs to the level of the heart or above for 30 minutes daily and/or when sitting, a frequency of: - 3-4 times throughout the day. Avoid standing for long periods of time. Off-Loading: Wedge shoe to: - Patient to wear while standing and walking. WOUND #13: - T Great Wound Laterality: Right, Posterior oe Cleanser: Wound Cleanser (Generic) Every Other Day/15 Days Discharge Instructions: Cleanse the wound with wound cleanser prior to applying a clean dressing using gauze sponges, not tissue or  cotton balls. Prim Dressing: KerraCel Ag Gelling Fiber Dressing, 4x5 in (silver alginate) (Generic) Every Other Day/15 Days ary Discharge Instructions: Apply silver alginate to wound bed as instructed Secondary Dressing: Woven Gauze Sponge, Non-Sterile 4x4 in (Generic) Every Other Day/15 Days Discharge Instructions: Apply over primary dressing as directed. Secondary Dressing: Optifoam Non-Adhesive Dressing, 4x4 in (Generic) Every Other Day/15 Days Discharge Instructions: **Foam donut***Apply over primary dressing as directed. Secured With: Child psychotherapist, Sterile 2x75 (in/in) (Generic) Every Other Day/15 Days Discharge Instructions: Secure with stretch gauze as directed. Secured With: 52M Medipore H Soft Cloth Surgical T ape, 2x2 (in/yd) (Generic) Every Other Day/15 Days Discharge Instructions: Secure dressing with tape as directed. 1. In office sharp debridement 2. Silver alginate 3. Aggressive offloading 4. Follow-up in 1 week Electronic Signature(s) Signed: 06/10/2021 12:39:11 PM By: Kalman Shan DO Entered By: Kalman Shan on 06/10/2021 12:37:44 -------------------------------------------------------------------------------- HxROS Details Patient Name: Date of Service: [name], [name] 06/10/2021 11:00 A M Medical Record Number: [id number] Patient Account Number: [id number] Date of Birth/Sex: Treating RN: Nov 11, 1961 (60 y.o. [name], [name] Primary Care Provider: MA Iline Oven TIE Other Clinician:  Referring Provider: Treating Provider/Extender: Kalman Shan MA STERS, KA TIE Weeks in Treatment: 6 Label Progress Note Print Version as History and Physical for this encounter Information Obtained From Patient Eyes Medical History: Negative for: Cataracts; Glaucoma; Optic Neuritis Ear/Nose/Mouth/Throat Medical History: Negative for: Chronic sinus problems/congestion; Middle ear problems Hematologic/Lymphatic Medical History: Positive for:  Anemia Negative for: Hemophilia; Human Immunodeficiency Virus; Lymphedema; Sickle Cell Disease Respiratory Medical History: Positive for: Asthma Negative for: Aspiration; Chronic Obstructive Pulmonary Disease (COPD); Pneumothorax; Sleep Apnea; Tuberculosis Cardiovascular Medical History: Positive for: Hypertension Negative for: Angina; Arrhythmia; Congestive Heart Failure; Coronary Artery Disease; Deep Vein Thrombosis; Hypotension; Myocardial Infarction; Peripheral Arterial Disease; Peripheral Venous Disease; Phlebitis; Vasculitis Gastrointestinal Medical History: Negative for: Cirrhosis ; Colitis; Crohns; Hepatitis A; Hepatitis B; Hepatitis C Past Medical History Notes: GERD Endocrine Medical History: Positive for: Type II Diabetes Negative for: Type I Diabetes Time with diabetes: 98 years Treated with: Insulin, Oral agents Blood sugar tested every day: Yes Tested : 2 times per day Blood sugar testing results: Lunch: 120-160 Genitourinary Medical History: Negative for: End Stage Renal Disease Immunological Medical History: Negative for: Lupus Erythematosus; Raynauds; Scleroderma Integumentary (Skin) Medical History: Negative for: History of Burn Musculoskeletal Medical History: Positive for: Osteomyelitis - hx left great toe Negative for: Gout; Rheumatoid Arthritis; Osteoarthritis Neurologic Medical History: Positive for: Neuropathy Negative for: Dementia; Quadriplegia; Paraplegia; Seizure Disorder Oncologic Medical History: Negative for: Received Chemotherapy; Received Radiation Psychiatric Medical History: Positive for: Confinement Anxiety Negative for: Anorexia/bulimia Immunizations Pneumococcal Vaccine: Received Pneumococcal Vaccination: Yes Immunization Notes: pt. 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 c-section x4 Family and Social  History Cancer: No; Diabetes: Yes - Father,Child; Heart Disease: No; Hereditary Spherocytosis: No; Hypertension: Yes - Mother,Father; Kidney Disease: No; Lung Disease: No; Seizures: No; Stroke: No; Thyroid Problems: No; Tuberculosis: No; Never smoker; Marital Status - Divorced; Alcohol Use: Never; Drug Use: No History; Caffeine Use: Rarely - soda; Financial Concerns: No; Food, Clothing or Shelter Needs: No; Support System Lacking: No; Transportation Concerns: No Electronic Signature(s) Signed: 06/10/2021 12:39:11 PM By: Kalman Shan DO Signed: 06/10/2021 6:01:49 PM By: Rhae Hammock RN Entered By: Kalman Shan on 06/10/2021 12:30:19 -------------------------------------------------------------------------------- SuperBill Details Patient Name: Date of Service: [name], [name] 06/10/2021 Medical Record Number: [id number] Patient Account Number: [id number] Date of Birth/Sex: Treating RN: July 18, 1961 (60 y.o. 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 E11.65 Type 2 diabetes mellitus with hyperglycemia Facility Procedures CPT4 Code: 16010932 Description: 35573 - DEB SUBQ TISSUE 20 SQ CM/< ICD-10 Diagnosis Description M86.171 Other acute osteomyelitis, right ankle and foot E11.621 Type 2 diabetes mellitus with foot ulcer Modifier: Quantity: 1 Physician Procedures : CPT4 Code Description Modifier [id number] 11042 - WC PHYS SUBQ TISS 20 SQ CM ICD-10 Diagnosis Description M86.171 Other acute osteomyelitis, right ankle and foot E11.621 Type 2 diabetes mellitus with foot ulcer Quantity: 1 Electronic  Signature(s) Signed: 06/10/2021 12:39:11 PM By: Kalman Shan DO Entered By: Kalman Shan on 06/10/2021 12:38:27

## 2021-06-10 NOTE — Progress Notes (Addendum)
[name], [name] ([id number]) Visit Report for 06/10/2021 Arrival Information Details Patient Name: Date of Service: [name] 06/10/2021 11:00 [place] Record Number: [id number] Patient Account Number: [id number] Date of Birth/Sex: Treating RN: June 21, 1961 (60 y.o. [name] Primary Care [name]: MA [name] Rivera Other Clinician: Referring [name]: Treating [name]/Extender: [name] Visit Information History Since Last Visit Added or deleted any medications: No Patient Arrived: Ambulatory Any new allergies or adverse reactions: No Arrival Time: 10:37 Had a fall or experienced change in No Transfer Assistance: None activities of daily living that may affect Patient Identification Verified: Yes risk of falls: Secondary Verification Process Completed: Yes Signs or symptoms of abuse/neglect since last visito No Patient Requires Transmission-Based Precautions: No Hospitalized since last visit: No Patient Has Alerts: Yes Implantable device outside of the clinic excluding No Patient Alerts: ABI R=1.18 cellular tissue based products placed in the center ABI L=1.1 since last visit: Has Dressing in Place as Prescribed: Yes Pain Present Now: No Electronic Signature(s) Signed: 06/10/2021 Rivera:01:46 PM By: [name] Entered By: [name] on 06/10/2021 10:39:11 -------------------------------------------------------------------------------- Encounter Discharge Information Details Patient Name: Date of Service: [name], [name] 06/10/2021 11:00 [name] Record Number: [id number] Patient Account Number: [id number] Date of Birth/Sex: Treating RN: [birth year] (60 y.o. [name] Primary Care Kass Herberger: MA [name] Rivera Other Clinician: Referring Somnang Mahan: Treating [name]/Extender: [name] Weeks in Treatment: Rivera Encounter Discharge Information Items Post Procedure Vitals Discharge  Condition: Stable Temperature (F): 98.8 Ambulatory Status: Ambulatory Pulse (bpm): 110 Discharge Destination: Home Respiratory Rate (breaths/min): 18 Transportation: Private Auto Blood Pressure (mmHg): 135/81 Schedule Follow-up Appointment: Yes Clinical Summary of Care: Provided on 06/10/2021 Form Type Recipient Paper Patient Patient Electronic Signature(s) Signed: 06/10/2021 Rivera:01:46 PM By: [name] Entered By: [name] on 06/10/2021 11:02:48 -------------------------------------------------------------------------------- Lower Extremity Assessment Details Patient Name: Date of Service: [name] 06/10/2021 11:00 A M Medical Record Number: [id number] Patient Account Number: [id number] Date of Birth/Sex: Treating RN: 1961-02-08 (60 y.o. [name] Primary Care Knowledge Escandon: [name] Rivera Other Clinician: Referring Olyn Landstrom: Treating Emerly Prak/Extender: [name] Weeks in Treatment: Rivera Edema Assessment Assessed: [Left: No] [Right: Yes] Edema: [Left: N] [Right: o] Calf Left: Right: Point of Measurement: 27 cm From Medial Instep 39 cm Ankle Left: Right: Point of Measurement: 9 cm From Medial Instep 22.5 cm Vascular Assessment Pulses: Dorsalis Pedis Palpable: [Right:Yes] Electronic Signature(s) Signed: 06/10/2021 Rivera:01:46 PM By: [name] Entered By: [name] on 06/10/2021 10:47:26 -------------------------------------------------------------------------------- Multi Wound Chart Details Patient Name: Date of Service: [name], [facility] 06/10/2021 11:00 A M Medical Record Number: [id number] Patient Account Number: [id number] Date of Birth/Sex: Treating RN: 11-20-1961 (60 y.o. [name], [name] Primary Care Shanteria Laye: [name] Rivera Other Clinician: Referring [name]: Treating [name]/Extender: [name] Weeks in Treatment: Rivera Vital Signs Height(in): Pulse(bpm):  110 Weight(lbs): Blood Pressure(mmHg): 135/81 Body Mass Index(BMI): Temperature(F): 98.8 Respiratory Rate(breaths/min): 18 Photos: [13:No Photos Right, Posterior T Great] [N/A:N/A oe N/A] Wound Location: [13:Gradually Appeared] [N/A:N/A] Wounding Event: [13:Diabetic Wound/Ulcer of the Lower] [N/A:N/A] Primary Etiology: [13:Extremity Anemia, Asthma, Hypertension, Type] [N/A:N/A] Comorbid History: [13:II Diabetes, Osteomyelitis, Neuropathy, Confinement Anxiety 04/05/2021] [N/A:N/A] Date Acquired: [13:Rivera] [N/A:N/A] Weeks of Treatment: [13:Open] [N/A:N/A] Wound Status: [13:0.2x0.3x0.2] [N/A:N/A] Measurements L x W x D (cm) [13:0.047] [N/A:N/A] A (cm) : rea [13:0.009] [N/A:N/A] Volume (cm) : [13:0.00%] [N/A:N/A] % Reduction in A rea: [  13:35.70%] [N/A:N/A] % Reduction in Volume: [13:Grade 1] [N/A:N/A] Classification: [13:Medium] [N/A:N/A] Exudate A mount: [13:Sanguinous] [N/A:N/A] Exudate Type: [13:red] [N/A:N/A] Exudate Color: [13:Well defined, not attached] [N/A:N/A] Wound Margin: [13:Large (67-100%)] [N/A:N/A] Granulation A mount: [13:Pink, Pale] [N/A:N/A] Granulation Quality: [13:None Present (0%)] [N/A:N/A] Necrotic A mount: [13:Fat Layer (Subcutaneous Tissue): Yes N/A] Exposed Structures: [13:Fascia: No Tendon: No Muscle: No Joint: No Bone: No None] [N/A:N/A] Epithelialization: [13:Debridement - Excisional] [N/A:N/A] Debridement: Pre-procedure Verification/Time Out 10:52 [N/A:N/A] Taken: [13:Other] [N/A:N/A] Pain Control: [13:Subcutaneous] [N/A:N/A] Tissue Debrided: [13:Skin/Subcutaneous Tissue] [N/A:N/A] Level: [13:0.06] [N/A:N/A] Debridement A (sq cm): [13:rea Curette] [N/A:N/A] Instrument: [13:Minimum] [N/A:N/A] Bleeding: [13:Pressure] [N/A:N/A] Hemostasis A chieved: [13:Procedure was tolerated well] [N/A:N/A] Debridement Treatment Response: [13:0.2x0.3x0.2] [N/A:N/A] Post Debridement Measurements L x W x D (cm) [13:0.009] [N/A:N/A] Post Debridement Volume: (cm)  [13:Debridement] [N/A:N/A] Treatment Notes Wound #13 (Toe Great) Wound Laterality: Right, Posterior Cleanser Wound Cleanser Discharge Instruction: Cleanse the wound with wound cleanser prior to applying a clean dressing using gauze sponges, not tissue or cotton balls. Peri-Wound Care Topical Primary Dressing KerraCel Ag Gelling Fiber Dressing, 4x5 in (silver alginate) Discharge Instruction: Apply silver alginate to wound bed as instructed Secondary Dressing Woven Gauze Sponge, Non-Sterile 4x4 in Discharge Instruction: Apply over primary dressing as directed. Optifoam Non-Adhesive Dressing, 4x4 in Discharge Instruction: **Foam donut***Apply over primary dressing as directed. Secured With Conforming Stretch Gauze Bandage, Sterile 2x75 (in/in) Discharge Instruction: Secure with stretch gauze as directed. 41M Medipore H Soft Cloth Surgical T ape, 2x2 (in/yd) Discharge Instruction: Secure dressing with tape as directed. Compression Wrap Compression Stockings Add-Ons Electronic Signature(s) Signed: 06/10/2021 12:39:11 PM By: Debra Shan DO Signed: 06/10/2021 Rivera:01:49 PM By: Rhae Hammock RN Entered By: Debra Shan on 06/10/2021 12:28:33 -------------------------------------------------------------------------------- Multi-Disciplinary Care Plan Details Patient Name: Date of Service: PUANANI, GENE 06/10/2021 11:00 A M Medical Record Number: [id number] Patient Account Number: [id number] Date of Birth/Sex: Treating RN: 07-Oct-1961 (60 y.o. [name] Primary Care Epimenio Schetter: MA [name] Rivera Other Clinician: Referring Iwao Shamblin: Treating Jejuan Scala/Extender: [name] Weeks in Treatment: Rivera Active Inactive Nutrition Nursing Diagnoses: Impaired glucose control: actual or potential Potential for alteratiion in Nutrition/Potential for imbalanced nutrition Goals: Patient/caregiver agrees to and verbalizes understanding of need to obtain  nutritional consultation Date Initiated: Rivera/07/2021 Date Inactivated: 06/04/2021 Target Resolution Date: Rivera/30/2022 Goal Status: Met Patient/caregiver will maintain therapeutic glucose control Date Initiated: Rivera/07/2021 Target Resolution Date: 06/25/2021 Goal Status: Active Interventions: Assess HgA1c results as ordered upon admission and as needed Provide education on elevated blood sugars and impact on wound healing Provide education on nutrition Treatment Activities: Patient referred to Primary Care Physician for further nutritional evaluation : Rivera/07/2021 Notes: Wound/Skin Impairment Nursing Diagnoses: Knowledge deficit related to ulceration/compromised skin integrity Goals: Patient/caregiver will verbalize understanding of skin care regimen Date Initiated: Rivera/07/2021 Target Resolution Date: 06/18/2021 Goal Status: Active Interventions: Assess patient/caregiver ability to obtain necessary supplies Assess patient/caregiver ability to perform ulcer/skin care regimen upon admission and as needed Provide education on ulcer and skin care Treatment Activities: Skin care regimen initiated : Rivera/07/2021 Topical wound management initiated : Rivera/07/2021 Notes: Electronic Signature(s) Signed: 06/10/2021 Rivera:01:46 PM By: [name] Entered By: [name] on 06/10/2021 10:55:21 -------------------------------------------------------------------------------- Pain Assessment Details Patient Name: Date of Service: CAMREE, [place] 06/10/2021 11:00 [place] Record Number: [id number] Patient Account Number: [id number] Date of Birth/Sex: Treating RN: Nov 19, 1961 (60 y.o. [name] Primary Care Paulo Keimig: [name] Rivera Other Clinician: Referring Sadiq Mccauley: Treating Rube Sanchez/Extender: [name], KA Rivera [place]  in Treatment: Rivera Active Problems Location of Pain Severity and Description of Pain Patient Has Paino No Site Locations Pain Management and Medication Current Pain  Management: Electronic Signature(s) Signed: 06/10/2021 Rivera:01:46 PM By: [name] Entered By: [name] on 06/10/2021 10:43:10 -------------------------------------------------------------------------------- Patient/Caregiver Education Details Patient Name: Date of Service: [name], Zaiya 7/14/2022andnbsp11:00 [place] Record Number: [id number] Patient Account Number: [id number] Date of Birth/Gender: Treating RN: November 03, 1961 (60 y.o. [name] Primary Care Physician: [name] Rivera Other Clinician: Referring Physician: Treating Physician/Extender: [name] Education Assessment Education Provided To: Patient Education Topics Provided Elevated Blood Sugar/ Impact on Healing: Methods: Explain/Verbal, Printed Responses: State content correctly Wound/Skin Impairment: Methods: Explain/Verbal, Printed Responses: State content correctly Electronic Signature(s) Signed: 06/10/2021 Rivera:01:46 PM By: [name] Entered By: [name] on 06/10/2021 10:55:45 -------------------------------------------------------------------------------- Wound Assessment Details Patient Name: Date of Service: FARRIE, SANN 06/10/2021 11:00 [place] Record Number: [id number] Patient Account Number: [id number] Date of Birth/Sex: Treating RN: [birth year] (60 y.o. [name] Primary Care Anneta Rounds: MA [name] Rivera Other Clinician: Referring Doristine Shehan: Treating Amanat Hackel/Extender: [name] Weeks in Treatment: Rivera Wound Status Wound Number: 13 Primary Diabetic Wound/Ulcer of the Lower Extremity Etiology: Wound Location: Right, Posterior T Great oe Wound Open Wounding Event: Gradually Appeared Status: Date Acquired: 04/05/2021 Comorbid Anemia, Asthma, Hypertension, Type II Diabetes, Osteomyelitis, Weeks Of Treatment: Rivera History: Neuropathy, Confinement Anxiety Clustered Wound: No Photos Wound  Measurements Length: (cm) 0.2 Width: (cm) 0.3 Depth: (cm) 0.2 Area: (cm) 0.047 Volume: (cm) 0.009 % Reduction in Area: 0% % Reduction in Volume: 35.7% Epithelialization: None Tunneling: No Undermining: No Wound Description Classification: Grade 1 Wound Margin: Well defined, not attached Exudate Amount: Medium Exudate Type: Sanguinous Exudate Color: red Foul Odor After Cleansing: No Slough/Fibrino No Wound Bed Granulation Amount: Large (67-100%) Exposed Structure Granulation Quality: Pink, Pale Fascia Exposed: No Necrotic Amount: None Present (0%) Fat Layer (Subcutaneous Tissue) Exposed: Yes Tendon Exposed: No Muscle Exposed: No Joint Exposed: No Bone Exposed: No Treatment Notes Wound #13 (Toe Great) Wound Laterality: Right, Posterior Cleanser Wound Cleanser Discharge Instruction: Cleanse the wound with wound cleanser prior to applying a clean dressing using gauze sponges, not tissue or cotton balls. Peri-Wound Care Topical Primary Dressing KerraCel Ag Gelling Fiber Dressing, 4x5 in (silver alginate) Discharge Instruction: Apply silver alginate to wound bed as instructed Secondary Dressing Woven Gauze Sponge, Non-Sterile 4x4 in Discharge Instruction: Apply over primary dressing as directed. Optifoam Non-Adhesive Dressing, 4x4 in Discharge Instruction: **Foam donut***Apply over primary dressing as directed. Secured With Conforming Stretch Gauze Bandage, Sterile 2x75 (in/in) Discharge Instruction: Secure with stretch gauze as directed. 88M Medipore H Soft Cloth Surgical T ape, 2x2 (in/yd) Discharge Instruction: Secure dressing with tape as directed. Compression Wrap Compression Stockings Add-Ons Electronic Signature(s) Signed: 06/11/2021 12:14:30 PM By: [name] Signed: 06/14/2021 3:49:49 PM By: Sandre Kitty Previous Signature: 06/10/2021 Rivera:01:46 PM Version By: [name] Entered By: Sandre Kitty on 06/11/2021  08:19:27 -------------------------------------------------------------------------------- Vitals Details Patient Name: Date of Service: [name], [facility] 06/10/2021 11:00 [place] Record Number: [id number] Patient Account Number: [id number] Date of Birth/Sex: Treating RN: [birth year] (60 y.o. [name] Primary Care Estle Sabella: MA [name] Rivera Other Clinician: Referring Kendallyn Lippold: Treating Lilliah Priego/Extender: [name] Weeks in Treatment: Rivera Vital Signs Time Taken: 10:42 Temperature (F): 98.8 Pulse (bpm): 110 Respiratory Rate (breaths/min): 18 Blood Pressure (mmHg): 135/81 Reference Range: 80 - 120  mg / dl Electronic Signature(s) Signed: 06/10/2021 Rivera:01:46 PM By: [name] Entered By: [name] on 06/10/2021 10:43:03

## 2021-06-15 NOTE — Progress Notes (Signed)
TANGANYIKA, BOWLDS ([id number]) Visit Report for 05/20/2021 Arrival Information Details Patient Name: Date of Service: [name], [name] 05/20/2021 10:00 [place] Record Number: [id number] Patient Account Number: [id number] Date of Birth/Sex: Treating RN: Nov 03, 1961 (60 y.o. Helene Shoe, Meta.Reding Primary Care Ivyonna Hoelzel: MA Iline Oven TIE Other Clinician: Referring Min Tunnell: Treating Madissen Wyse/Extender: Irena Reichmann, KA TIE Weeks in Treatment: 3 Visit Information History Since Last Visit All ordered tests and consults were completed: No Patient Arrived: Ambulatory Added or deleted any medications: No Arrival Time: 10:13 Any new allergies or adverse reactions: No Accompanied By: alone Had a fall or experienced change in No Transfer Assistance: None activities of daily living that may affect Patient Identification Verified: Yes risk of falls: Secondary Verification Process Completed: Yes Signs or symptoms of abuse/neglect since last visito No Patient Requires Transmission-Based Precautions: No Hospitalized since last visit: No Patient Has Alerts: Yes Implantable device outside of the clinic excluding No Patient Alerts: ABI R=1.18 cellular tissue based products placed in the center ABI L=1.1 since last visit: Has Dressing in Place as Prescribed: Yes Pain Present Now: No Electronic Signature(s) Signed: 06/15/2021 9:28:18 PM By: Deon Pilling Previous Signature: 06/14/2021 9:15:32 PM Version By: Deon Pilling Entered By: Deon Pilling on 06/15/2021 21:28:18 -------------------------------------------------------------------------------- Encounter Discharge Information Details Patient Name: Date of Service: [name], [facility] 05/20/2021 10:00 [place] Record Number: [id number] Patient Account Number: [id number] Date of Birth/Sex: Treating RN: February 28, 1961 (60 y.o. [name] Primary Care Yuuki Skeens: Wallis Bamberg TIE Other Clinician: Referring Dian Laprade: Treating  Jamall Strohmeier/Extender: Kalman Shan MA STERS, KA TIE Weeks in Treatment: 3 Encounter Discharge Information Items Post Procedure Vitals Discharge Condition: Stable Temperature (F): 98.6 Ambulatory Status: Ambulatory Pulse (bpm): 90 Discharge Destination: Home Respiratory Rate (breaths/min): 17 Transportation: Private Auto Blood Pressure (mmHg): 121/77 Accompanied By: alone Schedule Follow-up Appointment: No Clinical Summary of Care: Electronic Signature(s) Signed: 06/15/2021 9:31:12 PM By: Deon Pilling Previous Signature: 06/14/2021 9:15:32 PM Version By: Deon Pilling Entered By: Deon Pilling on 06/15/2021 21:31:12 -------------------------------------------------------------------------------- Lower Extremity Assessment Details Patient Name: Date of Service: [name], [name] 05/20/2021 10:00 [facility] Medical Record Number: [id number] Patient Account Number: [id number] Date of Birth/Sex: Treating RN: [birth year] (60 y.o. [name] Primary Care Breaunna Gottlieb: Wallis Bamberg TIE Other Clinician: Referring Keyonni Percival: Treating Theus Espin/Extender: Kalman Shan MA STERS, KA TIE Weeks in Treatment: 3 Edema Assessment Assessed: [Left: No] [Right: No] Edema: [Left: N] [Right: o] Calf Left: Right: Point of Measurement: 27 cm From Medial Instep 32 cm 32 cm Ankle Left: Right: Point of Measurement: 9 cm From Medial Instep 23.1 cm 21.8 cm Knee To Floor Left: Right: From Medial Instep 46.5 cm 46.5 cm Vascular Assessment Pulses: Dorsalis Pedis Palpable: [Left:Yes] [Right:Yes] Electronic Signature(s) Signed: 06/15/2021 9:29:22 PM By: Deon Pilling Previous Signature: 06/14/2021 9:15:32 PM Version By: Deon Pilling Entered By: Deon Pilling on 06/15/2021 21:29:22 -------------------------------------------------------------------------------- Multi Wound Chart Details Patient Name: Date of Service: [name], [facility] 05/20/2021 10:00 A M Medical Record Number: [id number] Patient  Account Number: [id number] Date of Birth/Sex: Treating RN: 04-Nov-1961 (60 y.o. [name], [name] Primary Care Kaysin Brock: Wallis Bamberg TIE Other Clinician: Referring Mattson Dayal: Treating Kailany Dinunzio/Extender: Kalman Shan MA STERS, KA TIE Weeks in Treatment: 3 Vital Signs Height(in): Pulse(bpm): 90 Weight(lbs): Blood Pressure(mmHg): 121/77 Body Mass Index(BMI): Temperature(F): 98.6 Respiratory Rate(breaths/min): 17 Photos: [13:No Photos Right, Posterior T Great oe] [N/A:N/A N/A] Wound Location: [13:Gradually Appeared] [N/A:N/A] Wounding Event: [13:Diabetic Wound/Ulcer of the Lower] [N/A:N/A] Primary Etiology: [13:Extremity Anemia, Asthma, Hypertension, Type] [  N/A:N/A] Comorbid History: [13:II Diabetes, Osteomyelitis, Neuropathy, Confinement Anxiety 04/05/2021] [N/A:N/A] Date Acquired: [13:3] [N/A:N/A] Weeks of Treatment: [13:Open] [N/A:N/A] Wound Status: [13:0.3x0.5x0.1] [N/A:N/A] Measurements L x W x D (cm) [13:0.118] [N/A:N/A] A (cm) : rea [13:0.012] [N/A:N/A] Volume (cm) : [13:-151.10%] [N/A:N/A] % Reduction in A [13:rea: 14.30%] [N/A:N/A] % Reduction in Volume: [13:Grade 1] [N/A:N/A] Classification: [13:Medium] [N/A:N/A] Exudate A mount: [13:Sanguinous] [N/A:N/A] Exudate Type: [13:red] [N/A:N/A] Exudate Color: [13:Well defined, not attached] [N/A:N/A] Wound Margin: [13:None Present (0%)] [N/A:N/A] Granulation A mount: [13:None Present (0%)] [N/A:N/A] Necrotic A mount: [13:Fascia: No] [N/A:N/A] Exposed Structures: [13:Fat Layer (Subcutaneous Tissue): No Tendon: No Muscle: No Joint: No Bone: No None] [N/A:N/A] Epithelialization: [13:Debridement - Excisional] [N/A:N/A] Debridement: Pre-procedure Verification/Time Out 10:45 [N/A:N/A] Taken: [13:Lidocaine] [N/A:N/A] Pain Control: [13:Callus, Subcutaneous] [N/A:N/A] Tissue Debrided: [13:Skin/Subcutaneous Tissue] [N/A:N/A] Level: [13:0.15] [N/A:N/A] Debridement A (sq cm): [13:rea Curette] [N/A:N/A] Instrument:  [13:Minimum] [N/A:N/A] Bleeding: [13:Pressure] [N/A:N/A] Hemostasis A chieved: [13:0] [N/A:N/A] Procedural Pain: [13:0] [N/A:N/A] Post Procedural Pain: [13:Procedure was tolerated well] [N/A:N/A] Debridement Treatment Response: [13:0.3x0.5x0.1] [N/A:N/A] Post Debridement Measurements L x W x D (cm) [13:0.012] [N/A:N/A] Post Debridement Volume: (cm) [13:entire region covered in callous -] [N/A:N/A] Assessment Notes: [13:measure is of lighter spot within the callous field. Pt states region closed with callous material due to "nurse visit" only last week Debridement] [N/A:N/A] Treatment Notes Electronic Signature(s) Signed: 05/20/2021 11:12:17 AM By: Kalman Shan DO Signed: 05/21/2021 6:18:32 PM By: Rhae Hammock RN Entered By: Kalman Shan on 05/20/2021 10:59:56 -------------------------------------------------------------------------------- [place] Details Patient Name: Date of Service: [name], [name] 05/20/2021 10:00 [place] Record Number: [id number] Patient Account Number: [id number] Date of Birth/Sex: Treating RN: 12-Nov-1961 (60 y.o. [name], [name] Primary Care Auren Valdes: Wallis Bamberg TIE Other Clinician: Referring Noreta Kue: Treating Leane Loring/Extender: Kalman Shan MA STERS, KA TIE Weeks in Treatment: 3 Active Inactive Nutrition Nursing Diagnoses: Impaired glucose control: actual or potential Potential for alteratiion in Nutrition/Potential for imbalanced nutrition Goals: Patient/caregiver agrees to and verbalizes understanding of need to obtain nutritional consultation Date Initiated: 05/06/2021 Target Resolution Date: 05/27/2021 Goal Status: Active Patient/caregiver will maintain therapeutic glucose control Date Initiated: 05/06/2021 Target Resolution Date: 06/03/2021 Goal Status: Active Interventions: Assess HgA1c results as ordered upon admission and as needed Provide education on elevated blood sugars and impact on wound  healing Provide education on nutrition Treatment Activities: Patient referred to Primary Care Physician for further nutritional evaluation : 05/06/2021 Notes: Wound/Skin Impairment Nursing Diagnoses: Knowledge deficit related to ulceration/compromised skin integrity Goals: Patient/caregiver will verbalize understanding of skin care regimen Date Initiated: 05/06/2021 Target Resolution Date: 06/04/2021 Goal Status: Active Interventions: Assess patient/caregiver ability to obtain necessary supplies Assess patient/caregiver ability to perform ulcer/skin care regimen upon admission and as needed Provide education on ulcer and skin care Treatment Activities: Skin care regimen initiated : 05/06/2021 Topical wound management initiated : 05/06/2021 Notes: Electronic Signature(s) Signed: 05/21/2021 6:18:32 PM By: Rhae Hammock RN Entered By: Rhae Hammock on 05/20/2021 10:48:07 -------------------------------------------------------------------------------- Pain Assessment Details Patient Name: Date of Service: [name], [name] 05/20/2021 10:00 [place] Record Number: [id number] Patient Account Number: [id number] Date of Birth/Sex: Treating RN: [birth year] (60 y.o. Helene Shoe, Meta.Reding Primary Care Cowan Pilar: Wallis Bamberg TIE Other Clinician: Referring Anel Creighton: Treating Thera Basden/Extender: Kalman Shan MA STERS, KA TIE Weeks in Treatment: 3 Active Problems Location of Pain Severity and Description of Pain Patient Has Paino No Site Locations Rate the pain. Rate the pain. Current Pain Level: 0 Pain Management and Medication Current Pain Management: Electronic Signature(s) Signed: 06/15/2021 9:29:10 PM  By: Deon Pilling Previous Signature: 06/14/2021 9:15:32 PM Version By: Deon Pilling Entered By: Deon Pilling on 06/15/2021 21:29:09 -------------------------------------------------------------------------------- Patient/Caregiver Education Details Patient Name: Date of  Service: [name], [name] 6/23/2022andnbsp10:00 [place] Record Number: [id number] Patient Account Number: [id number] Date of Birth/Gender: Treating RN: Jan 28, 1961 (60 y.o. [name], [name] Primary Care Physician: Wallis Bamberg TIE Other Clinician: Referring Physician: Treating Physician/Extender: Irena Reichmann, KA TIE Weeks in Treatment: 3 Education Assessment Education Provided To: Patient Education Topics Provided Wound/Skin Impairment: Methods: Explain/Verbal Responses: State content correctly Electronic Signature(s) Signed: 05/21/2021 6:18:32 PM By: Rhae Hammock RN Entered By: Rhae Hammock on 05/20/2021 11:14:13 -------------------------------------------------------------------------------- Wound Assessment Details Patient Name: Date of Service: [name], [name] 05/20/2021 10:00 [place] Record Number: [id number] Patient Account Number: [id number] Date of Birth/Sex: Treating RN: 07-06-1961 (60 y.o. Helene Shoe, Meta.Reding Primary Care Taliesin Hartlage: MA Iline Oven TIE Other Clinician: Referring Yannis Gumbs: Treating Jasim Harari/Extender: Kalman Shan MA STERS, KA TIE Weeks in Treatment: 3 Wound Status Wound Number: 13 Primary Diabetic Wound/Ulcer of the Lower Extremity Etiology: Wound Location: Right, Posterior T Great oe Wound Open Wounding Event: Gradually Appeared Status: Date Acquired: 04/05/2021 Comorbid Anemia, Asthma, Hypertension, Type II Diabetes, Osteomyelitis, Weeks Of Treatment: 3 History: Neuropathy, Confinement Anxiety Clustered Wound: No Photos Wound Measurements Length: (cm) 0.3 Width: (cm) 0.5 Depth: (cm) 0.1 Area: (cm) 0.118 Volume: (cm) 0.012 % Reduction in Area: -151.1% % Reduction in Volume: 14.3% Epithelialization: None Tunneling: No Undermining: No Wound Description Classification: Grade 1 Wound Margin: Well defined, not attached Exudate Amount: Medium Exudate Type: Sanguinous Exudate Color: red Foul Odor  After Cleansing: No Slough/Fibrino No Wound Bed Granulation Amount: None Present (0%) Exposed Structure Necrotic Amount: None Present (0%) Fascia Exposed: No Fat Layer (Subcutaneous Tissue) Exposed: No Tendon Exposed: No Muscle Exposed: No Joint Exposed: No Bone Exposed: No Assessment Notes entire region covered in callous - measure is of lighter spot within the callous field. Pt states region closed with callous material due to "nurse visit" only last week Treatment Notes Wound #13 (Toe Great) Wound Laterality: Right, Posterior Cleanser Wound Cleanser Discharge Instruction: Cleanse the wound with wound cleanser prior to applying a clean dressing using gauze sponges, not tissue or cotton balls. Peri-Wound Care Topical Primary Dressing KerraCel Ag Gelling Fiber Dressing, 4x5 in (silver alginate) Discharge Instruction: Apply silver alginate to wound bed as instructed Secondary Dressing Woven Gauze Sponge, Non-Sterile 4x4 in Discharge Instruction: Apply over primary dressing as directed. Optifoam Non-Adhesive Dressing, 4x4 in Discharge Instruction: **Foam donut***Apply over primary dressing as directed. Secured With Conforming Stretch Gauze Bandage, Sterile 2x75 (in/in) Discharge Instruction: Secure with stretch gauze as directed. 71M Medipore H Soft Cloth Surgical T ape, 2x2 (in/yd) Discharge Instruction: Secure dressing with tape as directed. Compression Wrap Compression Stockings Add-Ons Electronic Signature(s) Signed: 06/15/2021 9:29:51 PM By: Deon Pilling Previous Signature: 05/20/2021 4:12:25 PM Version By: Sandre Kitty Previous Signature: 06/14/2021 9:15:32 PM Version By: Deon Pilling Entered By: Deon Pilling on 06/15/2021 21:29:50 -------------------------------------------------------------------------------- Vitals Details Patient Name: Date of Service: [name], [facility] 05/20/2021 10:00 [place] Record Number: [id number] Patient Account Number:  [id number] Date of Birth/Sex: Treating RN: 04/15/1961 (60 y.o. Helene Shoe, Meta.Reding Primary Care Daralyn Bert: Wallis Bamberg TIE Other Clinician: Referring Nevin Grizzle: Treating Braylynn Lewing/Extender: Kalman Shan MA STERS, KA TIE Weeks in Treatment: 3 Vital Signs Time Taken: 10:44 Temperature (F): 98.6 Pulse (bpm): 90 Respiratory Rate (breaths/min): 17 Blood Pressure (mmHg): 121/77 Reference Range: 80 - 120 mg / dl Electronic Signature(s) Signed:  06/15/2021 9:28:49 PM By: Deon Pilling Previous Signature: 06/14/2021 9:15:32 PM Version By: Deon Pilling Entered By: Deon Pilling on 06/15/2021 21:28:49

## 2021-06-17 ENCOUNTER — Encounter (HOSPITAL_BASED_OUTPATIENT_CLINIC_OR_DEPARTMENT_OTHER): Payer: Medicaid Other | Admitting: Internal Medicine

## 2021-06-24 ENCOUNTER — Encounter (HOSPITAL_BASED_OUTPATIENT_CLINIC_OR_DEPARTMENT_OTHER): Payer: Medicaid Other | Admitting: Internal Medicine

## 2021-06-24 ENCOUNTER — Encounter: Payer: Self-pay | Admitting: Internal Medicine

## 2021-06-24 ENCOUNTER — Telehealth (INDEPENDENT_AMBULATORY_CARE_PROVIDER_SITE_OTHER): Payer: Medicaid Other | Admitting: Internal Medicine

## 2021-06-24 ENCOUNTER — Other Ambulatory Visit: Payer: Self-pay

## 2021-06-24 DIAGNOSIS — L97519 Non-pressure chronic ulcer of other part of right foot with unspecified severity: Secondary | ICD-10-CM

## 2021-06-24 DIAGNOSIS — M869 Osteomyelitis, unspecified: Secondary | ICD-10-CM

## 2021-06-24 DIAGNOSIS — E1169 Type 2 diabetes mellitus with other specified complication: Secondary | ICD-10-CM | POA: Diagnosis not present

## 2021-06-24 DIAGNOSIS — E11621 Type 2 diabetes mellitus with foot ulcer: Secondary | ICD-10-CM | POA: Diagnosis not present

## 2021-06-24 NOTE — Progress Notes (Signed)
Debra, Rivera ([id number]) Visit Report for 06/24/2021 Chief Complaint Document Details Patient Name: Date of Service: [name], [name] 06/24/2021 12:30 PM Medical Record Number: [id number] Patient Account Number: [id number] Date of Birth/Sex: Treating RN: 11-26-1961 (60 y.o. [name] Primary Care Provider: Christiana Fuchs Other Clinician: Referring Provider: Treating Provider/Extender: Doran Stabler in Treatment: 8 Information Obtained from: Patient Chief Complaint wound to the distal portion of the right great toe Electronic Signature(s) Signed: 06/24/2021 1:41:17 PM By: Kalman Shan DO Entered By: Kalman Shan on 06/24/2021 13:37:54 -------------------------------------------------------------------------------- Debridement Details Patient Name: Date of Service: Rivera, Debra 06/24/2021 12:30 PM Medical Record Number: [id number] Patient Account Number: [id number] Date of Birth/Sex: Treating RN: 05-Oct-1961 (60 y.o. [name] Primary Care Provider: Christiana Fuchs Other Clinician: Referring Provider: Treating Provider/Extender: Doran Stabler in Treatment: 8 Debridement Performed for Assessment: Wound #13 Right,Posterior T Great oe Performed By: Physician Kalman Shan, DO Debridement Type: Debridement Severity of Tissue Pre Debridement: Fat layer exposed Level of Consciousness (Pre-procedure): Awake and Alert Pre-procedure Verification/Time Out Yes - 12:50 Taken: Start Time: 12:51 Pain Control: Lidocaine 5% topical ointment T Area Debrided (L x W): otal 1 (cm) x 2 (cm) = 2 (cm) Tissue and other material debrided: Viable, Non-Viable, Callus, Slough, Subcutaneous, Skin: Dermis , Skin: Epidermis, Fibrin/Exudate, Slough Level: Skin/Subcutaneous Tissue Debridement Description: Excisional Instrument: Curette Bleeding: Minimum Hemostasis Achieved: Pressure End Time: 12:55 Procedural Pain: 0 Post  Procedural Pain: 0 Response to Treatment: Procedure was tolerated well Level of Consciousness (Post- Awake and Alert procedure): Post Debridement Measurements of Total Wound Length: (cm) 0.2 Width: (cm) 0.4 Depth: (cm) 0.2 Volume: (cm) 0.013 Character of Wound/Ulcer Post Debridement: Requires Further Debridement Severity of Tissue Post Debridement: Fat layer exposed Post Procedure Diagnosis Same as Pre-procedure Electronic Signature(s) Signed: 06/24/2021 1:41:17 PM By: Kalman Shan DO Signed: 06/24/2021 5:49:27 PM By: Deon Pilling Entered By: Deon Pilling on 06/24/2021 12:55:39 -------------------------------------------------------------------------------- HPI Details Patient Name: Date of Service: Debra, Rivera 06/24/2021 12:30 PM Medical Record Number: [id number] Patient Account Number: [id number] Date of Birth/Sex: Treating RN: 1961/09/13 (60 y.o. [name] Primary Care Provider: Christiana Fuchs Other Clinician: Referring Provider: Treating Provider/Extender: Doran Stabler in Treatment: 8 History of Present Illness Location: notice some discharge and callus on the right big toe Quality: Patient reports experiencing a dull pain to affected area(s). Severity: Patient states wound(s) are getting worse. Duration: Patient has had the wound for < 2 weeks prior to presenting for treatment Timing: Pain in wound is Intermittent (comes and goes Context: The wound occurred when the patient was cutting her toenails and noticed some discharge from this area and was very concerned about it. Modifying Factors: Patient wound(s)/ulcer(s) are improving due to:he has seen her PCP who put her on Bactrim and Keflex antibiotic ssociated Signs and Symptoms: Patient reports having decrease discharge she started on her antibiotic. A HPI Description: [name] is a 60 year old female with a past medical history of uncontrolled type 2 diabetes on insulin,  osteomyelitis with amputations of 1st and 2nd toes of her left foot that presents to clinic for wound care to the right great toe wound. She states she noticed the wound 3 weeks ago. Not quite sure how it started. She has been using silver alginate to the area. She was recently hospitalized from a clinic visit in the internal medicine clinic for this issue. While hospitalized she had an MRI of the right foot that showed likely  early acute osteomyelitis with possible septic arthritis. She was discharged on ciprofloxacin for 28 days. She currently does not deny pain increased warmth or erythema to the area. She has some drainage but not purulent. 6/9; patient presents for 1 week follow-up. She has no complaints today and denies signs of infection. She missed her infectious disease appointment yesterday but has it rescheduled for the 21st. She has been using silver alginate to the wound. 6/23; patient presents for 2-week follow-up. She followed up with Dr. Megan Salon, infectious disease 2 days ago. She had some blood work done and was told to finish ciprofloxacin. She is having a follow-up virtual encounter next week. There has been an increase in callus development. She is unable to use her offloading shoe due to having to walk for long periods due to needing public transportation. She continues to use silver alginate to the wound. She denies signs of infection. 7/8; patient presents for follow-up. She missed her appointment last week. She reports increased callus to the surrounding wound area. She continues to use silver alginate. She denies signs of infection. 7/14; patient presents for 1 week follow-up. She has been using silver alginate to the wound bed. She has no issues or complaints today. She denies signs of infection. 7/28; patient presents for 2-week follow-up. She has been using silver alginate to the wound bed. She has been moving to a new location over the past week. She has no issues or  complaints today. She denies signs of infection. Electronic Signature(s) Signed: 06/24/2021 1:41:17 PM By: Kalman Shan DO Entered By: Kalman Shan on 06/24/2021 13:38:43 -------------------------------------------------------------------------------- Physical Exam Details Patient Name: Date of Service: Debra, Rivera 06/24/2021 12:30 PM Medical Record Number: [id number] Patient Account Number: [id number] Date of Birth/Sex: Treating RN: [birth year] (60 y.o. [name] Primary Care Provider: Other Clinician: Christiana Fuchs Referring Provider: Treating Provider/Extender: Doran Stabler in Treatment: 8 Constitutional respirations regular, non-labored and within target range for patient.. Cardiovascular 2+ dorsalis pedis/posterior tibialis pulses. Psychiatric pleasant and cooperative. Notes Right great toe: Posterior aspect with small open wound surrounded by nonviable tissue and callus. Post debridement there is granulation tissue present However there continues to be callus. This does not probe to bone. No obvious signs of infection. Electronic Signature(s) Signed: 06/24/2021 1:41:17 PM By: Kalman Shan DO Entered By: Kalman Shan on 06/24/2021 13:39:16 -------------------------------------------------------------------------------- Physician Orders Details Patient Name: Date of Service: SALIMAH, OTTUM 06/24/2021 12:30 PM Medical Record Number: [id number] Patient Account Number: [id number] Date of Birth/Sex: Treating RN: March 20, 1961 (60 y.o. 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 - Dr. Heber  Return A Bathing/ Shower/ Hygiene May shower with protection but do not get wound dressing(s) wet. - May use cast wrap for protection Edema Control - Lymphedema / SCD / Other Elevate legs to the level of the heart or above for 30 minutes daily and/or when sitting, a frequency of: - 3-4 times throughout the day. Avoid standing for long periods of time. Off-Loading Wedge shoe to: - Patient to wear while standing and walking. Wound Treatment Wound #13 - T Great oe  Wound Laterality: Right, Posterior Cleanser: Wound Cleanser (Generic) Every Other Day/15 Days Discharge Instructions: Cleanse the wound with wound cleanser prior to applying a clean dressing using gauze sponges, not tissue or cotton balls. Prim Dressing: KerraCel Ag Gelling Fiber Dressing, 4x5 in (silver alginate) (Generic) Every Other Day/15 Days ary Discharge Instructions: Apply silver alginate to wound bed as instructed Secondary Dressing: Woven Gauze Sponge, Non-Sterile 4x4 in (Generic) Every Other Day/15 Days Discharge Instructions: Apply over primary dressing as directed. Secondary Dressing: Optifoam Non-Adhesive Dressing, 4x4 in (Generic) Every Other Day/15 Days Discharge Instructions: **Foam donut***Apply over primary dressing as directed. Secured With: Child psychotherapist, Sterile 2x75 (in/in) (Generic) Every Other Day/15 Days Discharge Instructions: Secure with stretch gauze as directed. Secured With: 44M Medipore H Soft Cloth Surgical Tape, 2x2 (in/yd) (Generic) Every Other Day/15 Days Discharge Instructions: Secure dressing with tape as directed. Electronic Signature(s) Signed: 06/24/2021 1:41:17 PM By: Kalman Shan DO Entered By: Kalman Shan on 06/24/2021  13:39:32 -------------------------------------------------------------------------------- Problem List Details Patient Name: Date of Service: SKYY, SCHRAEDER 06/24/2021 12:30 PM Medical Record Number: [id number] Patient Account Number: [id number] Date of Birth/Sex: Treating RN: Jun 03, 1961 (60 y.o. 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 [name] Primary Care Provider: Christiana Fuchs Other Clinician: Referring Provider: Treating Provider/Extender: Doran Stabler in Treatment: 8 Subjective Chief Complaint Information obtained from Patient wound to the distal portion of the right great toe History of Present Illness (HPI) The following HPI elements were documented for the patient's wound: Location: notice some discharge and callus on the right big toe Quality: Patient reports experiencing a dull pain to affected area(s). Severity: Patient states wound(s) are  getting worse. Duration: Patient has had the wound for < 2 weeks prior to presenting for treatment Timing: Pain in wound is Intermittent (comes and goes Context: The wound occurred when the patient was cutting her toenails and noticed some discharge from this area and was very concerned about it. Modifying Factors: Patient wound(s)/ulcer(s) are improving due to:he has seen her PCP who put her on Bactrim and Keflex antibiotic Associated Signs and Symptoms: Patient reports having decrease discharge she started on her antibiotic. Ms. Jaianna Cottongim is a 60 year old female with a past medical history of uncontrolled type 2 diabetes on insulin, osteomyelitis with amputations of 1st and 2nd toes of her left foot that presents to clinic for wound care to the right great toe wound. She states she noticed the wound 3 weeks ago. Not quite sure how it started. She has been using silver alginate to the area. She was recently hospitalized from a clinic visit in the internal medicine clinic for this issue. While hospitalized she had an MRI of the right foot that showed likely early acute osteomyelitis with  possible septic arthritis. She was discharged on ciprofloxacin for 28 days. She currently does not deny pain increased warmth or erythema to the area. She has some drainage but not purulent. 6/9; patient presents for 1 week follow-up. She has no complaints today and denies signs of infection. She missed her infectious disease appointment yesterday but has it rescheduled for the 21st. She has been using silver alginate to the wound. 6/23; patient presents for 2-week follow-up. She followed up with Dr. Megan Salon, infectious disease 2 days ago. She had some blood work done and was told to finish ciprofloxacin. She is having a follow-up virtual encounter next week. There has been an increase in callus development. She is unable to use her offloading shoe due to having to walk for long periods due to needing public  transportation. She continues to use silver alginate to the wound. She denies signs of infection. 7/8; patient presents for follow-up. She missed her appointment last week. She reports increased callus to the surrounding wound area. She continues to use silver alginate. She denies signs of infection. 7/14; patient presents for 1 week follow-up. She has been using silver alginate to the wound bed. She has no issues or complaints today. She denies signs of infection. 7/28; patient presents for 2-week follow-up. She has been using silver alginate to the wound bed. She has been moving to a new location over the past week. She has no issues or complaints today. She denies signs of infection. Patient History Information obtained from Patient. Family History Diabetes - Father,Child, Hypertension - Mother,Father, No family history of Cancer, Heart Disease, Hereditary Spherocytosis, Kidney Disease, Lung Disease, Seizures, Stroke, Thyroid Problems, Tuberculosis. Social History Never smoker, Marital Status - Divorced, Alcohol Use - Never, Drug Use - No History, Caffeine Use - Rarely - soda. Medical History Eyes Denies history of Cataracts, Glaucoma, Optic Neuritis Ear/Nose/Mouth/Throat Denies history of Chronic sinus problems/congestion, Middle ear problems Hematologic/Lymphatic Patient has history of Anemia Denies history of Hemophilia, Human Immunodeficiency Virus, Lymphedema, Sickle Cell Disease Respiratory Patient has history of Asthma Denies history of Aspiration, Chronic Obstructive Pulmonary Disease (COPD), Pneumothorax, Sleep Apnea, Tuberculosis Cardiovascular Patient has history of Hypertension Denies history of Angina, Arrhythmia, Congestive Heart Failure, Coronary Artery Disease, Deep Vein Thrombosis, Hypotension, Myocardial Infarction, Peripheral Arterial Disease, Peripheral Venous Disease, Phlebitis, Vasculitis Gastrointestinal Denies history of Cirrhosis , Colitis, Crohnoos,  Hepatitis A, Hepatitis B, Hepatitis C Endocrine Patient has history of Type II Diabetes Denies history of Type I Diabetes Genitourinary Denies history of End Stage Renal Disease Immunological Denies history of Lupus Erythematosus, Raynaudoos, Scleroderma Integumentary (Skin) Denies history of History of Burn Musculoskeletal Patient has history of Osteomyelitis - hx left great toe Denies history of Gout, Rheumatoid Arthritis, Osteoarthritis Neurologic Patient has history of Neuropathy Denies history of Dementia, Quadriplegia, Paraplegia, Seizure Disorder Oncologic Denies history of Received Chemotherapy, Received Radiation Psychiatric Patient has history of Confinement Anxiety Denies history of Anorexia/bulimia Patient is treated with Insulin, Oral Agents. Blood sugar is tested. Blood sugar results noted at the following times: Lunch - 120-160. Hospitalization/Surgery History - left second toe amputation. - left great toe amputation. - c-section x4. Medical A Surgical History Notes nd Gastrointestinal GERD Objective Constitutional respirations regular, non-labored and within target range for patient.. Vitals Time Taken: 12:38 PM, Temperature: 98.5 F, Pulse: 97 bpm, Respiratory Rate: 20 breaths/min, Blood Pressure: 119/65 mmHg. Cardiovascular 2+ dorsalis pedis/posterior tibialis pulses. Psychiatric pleasant and cooperative. General Notes: Right great toe: Posterior aspect with small open wound surrounded by nonviable tissue and  callus. Post debridement there is granulation tissue present However there continues to be callus. This does not probe to bone. No obvious signs of infection. Integumentary (Hair, Skin) Wound #13 status is Open. Original cause of wound was Gradually Appeared. The date acquired was: 04/05/2021. The wound has been in treatment 8 weeks. The wound is located on the [place]. The wound measures 0.1cm length x 0.4cm width x 0.3cm depth; 0.031cm^2  area and 0.009cm^3 volume. oe There is Fat Layer (Subcutaneous Tissue) exposed. There is no tunneling or undermining noted. There is a medium amount of sanguinous drainage noted. The wound margin is well defined and not attached to the wound base. There is large (67-100%) pink, pale granulation within the wound bed. There is no necrotic tissue within the wound bed. General Notes: Periwound callous present. 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 I debrided nonviable tissue. No signs of infection. I recommended continued silver alginate and offloading the area. Patient is not able to offload the area very well because she takes public transportation. She also moved in the past week. We rediscussed a total contact cast however she would like to hold off from doing this since it is difficult for her to be mobile. Procedures Wound #13 Pre-procedure diagnosis of Wound #13 is a Diabetic Wound/Ulcer of the Lower Extremity located on the [place] .Severity of Tissue Pre oe Debridement is: Fat layer exposed. There was a Excisional Skin/Subcutaneous Tissue Debridement with a total area of 2 sq cm performed by Kalman Shan, DO. With the following instrument(s): Curette to remove Viable and Non-Viable tissue/material. Material removed includes Callus, Subcutaneous Tissue, Slough, Skin: Dermis, Skin: Epidermis, and Fibrin/Exudate after achieving pain control using Lidocaine 5% topical ointment. A time out was conducted at 12:50, prior to the start of the procedure. A Minimum amount of bleeding was controlled with Pressure. The procedure was tolerated well with a pain level of 0 throughout and a pain level of 0 following the procedure. Post Debridement  Measurements: 0.2cm length x 0.4cm width x 0.2cm depth; 0.013cm^3 volume. Character of Wound/Ulcer Post Debridement requires further debridement. Severity of Tissue Post Debridement is: Fat layer exposed. Post procedure Diagnosis Wound #13: Same as Pre-Procedure Plan Follow-up Appointments: Return Appointment in 1 week. - Dr. Heber [place] Bathing/ Shower/ Hygiene: May shower with protection but do not get wound dressing(s) wet. - May use cast wrap for protection Edema Control - Lymphedema / SCD / Other: Elevate legs to the level of the heart or above for 30 minutes daily and/or when sitting, a frequency of: - 3-4 times throughout the day. Avoid standing for long periods of time. Off-Loading: Wedge shoe to: - Patient to wear while standing and walking. WOUND #13: - T Great Wound Laterality: Right, Posterior oe Cleanser: Wound Cleanser (Generic) Every Other Day/15 Days Discharge Instructions: Cleanse the wound with wound cleanser prior to applying a clean dressing using gauze sponges, not tissue or cotton balls. Prim Dressing: KerraCel Ag Gelling Fiber Dressing, 4x5 in (silver alginate) (Generic) Every Other Day/15 Days ary Discharge Instructions: Apply silver alginate to wound bed as instructed Secondary Dressing: Woven Gauze Sponge, Non-Sterile 4x4 in (Generic) Every Other Day/15 Days Discharge Instructions: Apply over primary dressing as directed. Secondary Dressing: Optifoam Non-Adhesive Dressing, 4x4 in (Generic) Every Other Day/15 Days Discharge Instructions: **  Foam donut***Apply over primary dressing as directed. Secured With: Child psychotherapist, Sterile 2x75 (in/in) (Generic) Every Other Day/15 Days Discharge Instructions: Secure with stretch gauze as directed. Secured With: 49M Medipore H Soft Cloth Surgical T ape, 2x2 (in/yd) (Generic) Every Other Day/15 Days Discharge Instructions: Secure dressing with tape as directed. 1. In office sharp debridement 2. Silver  alginate 3. Follow-up in 1 week Electronic Signature(s) Signed: 06/24/2021 1:41:17 PM By: Kalman Shan DO Entered By: Kalman Shan on 06/24/2021 13:40:50 -------------------------------------------------------------------------------- HxROS Details Patient Name: Date of Service: CATALINA, STRAHM 06/24/2021 12:30 PM Medical Record Number: [id number] Patient Account Number: [id number] Date of Birth/Sex: Treating RN: May 17, 1961 (60 y.o. [name] Primary Care Provider: Christiana Fuchs Other Clinician: Referring Provider: Treating Provider/Extender: Doran Stabler in Treatment: 8 Label Progress Note Print Version as History and Physical for this encounter Information Obtained From Patient Eyes Medical History: Negative for: Cataracts; Glaucoma; Optic Neuritis Ear/Nose/Mouth/Throat Medical History: Negative for: Chronic sinus problems/congestion; Middle ear problems Hematologic/Lymphatic Medical History: Positive for: Anemia Negative for: Hemophilia; Human Immunodeficiency Virus; Lymphedema; Sickle Cell Disease Respiratory Medical History: Positive for: Asthma Negative for: Aspiration; Chronic Obstructive Pulmonary Disease (COPD); Pneumothorax; Sleep Apnea; Tuberculosis Cardiovascular Medical History: Positive for: Hypertension Negative for: Angina; Arrhythmia; Congestive Heart Failure; Coronary Artery Disease; Deep Vein Thrombosis; Hypotension; Myocardial Infarction; Peripheral Arterial Disease; Peripheral Venous Disease; Phlebitis; Vasculitis Gastrointestinal Medical History: Negative for: Cirrhosis ; Colitis; Crohns; Hepatitis A; Hepatitis B; Hepatitis C Past Medical History Notes: GERD Endocrine Medical History: Positive for: Type II Diabetes Negative for: Type I Diabetes Time with diabetes: 86 years Treated with: Insulin, Oral agents Blood sugar tested every day: Yes Tested : 2 times per day Blood sugar testing results: Lunch:  120-160 Genitourinary Medical History: Negative for: End Stage Renal Disease Immunological Medical History: Negative for: Lupus Erythematosus; Raynauds; Scleroderma Integumentary (Skin) Medical History: Negative for: History of Burn Musculoskeletal Medical History: Positive for: Osteomyelitis - hx left great toe Negative for: Gout; Rheumatoid Arthritis; Osteoarthritis Neurologic Medical History: Positive for: Neuropathy Negative for: Dementia; Quadriplegia; Paraplegia; Seizure Disorder Oncologic Medical History: Negative for: Received Chemotherapy; Received Radiation Psychiatric Medical History: Positive for: Confinement Anxiety Negative for: Anorexia/bulimia Immunizations Pneumococcal Vaccine: Received Pneumococcal Vaccination: Yes Received Pneumococcal Vaccination On or After 60th Birthday: No Immunization Notes: pt. 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 c-section x4 Family and Social History Cancer: No; Diabetes: Yes - Father,Child; Heart Disease: No; Hereditary Spherocytosis: No; Hypertension: Yes - Mother,Father; Kidney Disease: No; Lung Disease: No; Seizures: No; Stroke: No; Thyroid Problems: No; Tuberculosis: No; Never smoker; Marital Status - Divorced; Alcohol Use: Never; Drug Use: No History; Caffeine Use: Rarely - soda; Financial Concerns: No; Food, Clothing or Shelter Needs: No; Support System Lacking: No; Transportation Concerns: No Electronic Signature(s) Signed: 06/24/2021 1:41:17 PM By: Kalman Shan DO Signed: 06/24/2021 5:49:27 PM By: Deon Pilling Entered By: Kalman Shan on 06/24/2021 13:38:54 -------------------------------------------------------------------------------- SuperBill Details Patient Name: Date of Service: JALESIA, SITES 06/24/2021 Medical Record Number: [id number] Patient Account Number: [id number] Date of  Birth/Sex: Treating RN: Sep 20, 1961 (60 y.o. 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 E11.65 Type 2 diabetes  mellitus with hyperglycemia Facility Procedures CPT4 Code: [id number] Description: [id number] - DEB SUBQ TISSUE 20 SQ CM/< ICD-10 Diagnosis Description 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 Modifier: ty Quantity: 1 Physician Procedures : CPT4 Code Description Modifier [id number] - WC PHYS SUBQ TISS 20 SQ CM ICD-10 Diagnosis Description 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

## 2021-06-24 NOTE — Progress Notes (Signed)
[name], [name] Rivera ([id number]) Visit Report for 06/24/2021 Arrival Information Details Patient Name: Date of Service: [name] [name] Rivera, [name] [name] Rivera 06/24/2021 12:30 PM Medical Record Number: [id number] Patient Account Number: [id number] Date of Birth/Sex: Treating RN: 24-Sep-1961 (60 y.o. [name] [name] Rivera, [name] [name] Rivera [name] [name] Rivera [name] [name] Rivera: [name] [name] Rivera [name] [name] Rivera [name] [name] Rivera: Treating [name] [name] Rivera/Extender: [name] [name] Rivera in Treatment: 8 Visit Information History Since Last Visit Added or deleted any medications: No Patient Arrived: Ambulatory Any new allergies or adverse reactions: No Arrival Time: 12:38 Had a fall or experienced change in No Accompanied By: self activities of daily living that may affect Transfer Assistance: None risk of falls: Patient Identification Verified: Yes Signs or symptoms of abuse/neglect since last visito No Secondary Verification Process Completed: Yes Hospitalized since last visit: No Patient Requires Transmission-Based Precautions: No Implantable device outside of the clinic excluding No Patient Has Alerts: Yes cellular tissue based products placed in the center Patient Alerts: ABI R=1.18 since last visit: ABI L=1.1 Has Dressing in Place as Prescribed: Yes Pain Present Now: No Electronic Signature(s) Signed: 06/24/2021 5:49:27 PM By: [name] [name] Rivera Entered By: [name] [name] Rivera on 06/24/2021 12:44:15 -------------------------------------------------------------------------------- Encounter Discharge Information Details Patient Name: Date of Service: [name] [name] Rivera, [name] [name] Rivera 06/24/2021 12:30 PM Medical Record Number: [id number] Patient Account Number: [id number] Date of Birth/Sex: Treating RN: 1961/11/05 (60 y.o. [name] [name] Rivera [name] [name] Rivera Kolleen Ochsner: [name] [name] Rivera [name] [name] Rivera Tahjae Clausing: Treating [name] [name] Rivera/Extender: [name] [name] Rivera in Treatment: 8 Encounter Discharge Information Items Post Procedure  Vitals Discharge Condition: Stable Temperature (F): 98.5 Ambulatory Status: Ambulatory Pulse (bpm): 97 Discharge Destination: Home Respiratory Rate (breaths/min): 20 Transportation: Private Auto Blood Pressure (mmHg): 119/65 Accompanied By: self Schedule Follow-up Appointment: Yes Clinical Summary of Rivera: Patient Declined Electronic Signature(s) Signed: 06/24/2021 5:27:01 PM By: [name] Gouty RN, BSN Entered By: [name] [name] Rivera on 06/24/2021 13:11:25 -------------------------------------------------------------------------------- Lower Extremity Assessment Details Patient Name: Date of Service: [name] [name] Rivera, [name] [name] Rivera 06/24/2021 12:30 PM Medical Record Number: [id number] Patient Account Number: [id number] Date of Birth/Sex: Treating RN: 1961/06/14 (59 y.o. [name] [name] Rivera [name] [name] Rivera: [name] [name] Rivera [name] [name] Rivera Italia Wolfert: Treating Takira Sherrin/Extender: [name] [name] Rivera in Treatment: 8 Edema Assessment Assessed: [Left: No] [Right: Yes] Edema: [Left: N] [Right: o] Calf Left: Right: Point of Measurement: 27 cm From Medial Instep 39 cm Ankle Left: Right: Point of Measurement: 9 cm From Medial Instep 23 cm Vascular Assessment Pulses: Dorsalis Pedis Palpable: [Right:Yes] Electronic Signature(s) Signed: 06/24/2021 5:49:27 PM By: [name] [name] Rivera Entered By: [name] [name] Rivera on 06/24/2021 12:44:48 -------------------------------------------------------------------------------- Multi Wound Chart Details Patient Name: Date of Service: [name] [name] Rivera, [name] [name] Rivera 06/24/2021 12:30 PM Medical Record Number: [id number] Patient Account Number: [id number] Date of Birth/Sex: Treating RN: October 07, 1961 (60 y.o. [name] [name] Rivera [name] [name] Rivera: [name] [name] Rivera [name] [name] Rivera Josetta Wigal: Treating [name] [name] Rivera/Extender: [name] [name] Rivera in Treatment: 8 Vital Signs Height(in): Pulse(bpm): 36 Weight(lbs): Blood Pressure(mmHg):  119/65 Body Mass Index(BMI): Temperature(F): 98.5 Respiratory Rate(breaths/min): 20 Photos: [13:Right, Posterior T Great] [N/A:N/A oe N/A] Wound Location: [13:Gradually Appeared] [N/A:N/A] Wounding Event: [13:Diabetic Wound/Ulcer of the Lower] [N/A:N/A] [name] Etiology: [13:Extremity Anemia, Asthma, Hypertension, Type N/A] Comorbid History: [13:II Diabetes, Osteomyelitis, Neuropathy, Confinement Anxiety 04/05/2021] [N/A:N/A] Date Acquired: [13:8] [N/A:N/A] Weeks of Treatment: [13:Open] [N/A:N/A] Wound Status: [13:0.1x0.4x0.3] [N/A:N/A] Measurements L x W x D (cm) [13:0.031] [N/A:N/A] A (cm) : rea [13:0.009] [N/A:N/A] Volume (cm) : [13:34.00%] [N/A:N/A] % Reduction in A [13:rea: 35.70%] [N/A:N/A] % Reduction in Volume: [13:Grade 1] [N/A:N/A] Classification: [13:Medium] [N/A:N/A] Exudate A mount: [13:Sanguinous] [N/A:N/A] Exudate Type: [13:red] [N/A:N/A]  Exudate Color: [13:Well defined, not attached] [N/A:N/A] Wound Margin: [13:Large (67-100%)] [N/A:N/A] Granulation A mount: [13:Pink, Pale] [N/A:N/A] Granulation Quality: [13:None Present (0%)] [N/A:N/A] Necrotic A mount: [13:Fat Layer (Subcutaneous Tissue): Yes N/A] Exposed Structures: [13:Fascia: No Tendon: No Muscle: No Joint: No Bone: No None] [N/A:N/A] Epithelialization: [13:Debridement - Excisional] [N/A:N/A] Debridement: Pre-procedure Verification/Time Out 12:50 [N/A:N/A] Taken: [13:Lidocaine 5% topical ointment] [N/A:N/A] Pain Control: [13:Callus, Subcutaneous, Slough] [N/A:N/A] Tissue Debrided: [13:Skin/Subcutaneous Tissue] [N/A:N/A] Level: [13:2] [N/A:N/A] Debridement A (sq cm): [13:rea Curette] [N/A:N/A] Instrument: [13:Minimum] [N/A:N/A] Bleeding: [13:Pressure] [N/A:N/A] Hemostasis A chieved: [13:0] [N/A:N/A] Procedural Pain: [13:0] [N/A:N/A] Post Procedural Pain: [13:Procedure was tolerated well] [N/A:N/A] Debridement Treatment Response: [13:0.2x0.4x0.2] [N/A:N/A] Post Debridement Measurements L x W x D (cm)  [13:0.013] [N/A:N/A] Post Debridement Volume: (cm) [13:Periwound callous present.] [N/A:N/A] Assessment Notes: [13:Debridement] [N/A:N/A] Treatment Notes Wound #13 (Toe Great) Wound Laterality: Right, Posterior Cleanser Wound Cleanser Discharge Instruction: Cleanse the wound with wound cleanser prior to applying a clean dressing using gauze sponges, not tissue or cotton balls. Peri-Wound Rivera Topical [name] Dressing KerraCel Ag Gelling Fiber Dressing, 4x5 in (silver alginate) Discharge Instruction: Apply silver alginate to wound bed as instructed Secondary Dressing Woven Gauze Sponge, Non-Sterile 4x4 in Discharge Instruction: Apply over [name] dressing as directed. Optifoam Non-Adhesive Dressing, 4x4 in Discharge Instruction: **Foam donut***Apply over [name] dressing as directed. Secured With Conforming Stretch Gauze Bandage, Sterile 2x75 (in/in) Discharge Instruction: Secure with stretch gauze as directed. 48M Medipore H Soft Cloth Surgical T ape, 2x2 (in/yd) Discharge Instruction: Secure dressing with tape as directed. Compression Wrap Compression Stockings Add-Ons Electronic Signature(s) Signed: 06/24/2021 1:41:17 PM By: Kalman Shan DO Signed: 06/24/2021 5:49:27 PM By: [name] [name] Rivera Entered By: Kalman Shan on 06/24/2021 13:37:45 -------------------------------------------------------------------------------- Multi-Disciplinary Rivera Plan Details Patient Name: Date of Service: [name] [name] Rivera, [name] [name] Rivera 06/24/2021 12:30 PM Medical Record Number: [id number] Patient Account Number: [id number] Date of Birth/Sex: Treating RN: [birth year] (60 y.o. [name] [name] Rivera [name] [name] Rivera Yidel Teuscher: [name] [name] Rivera [name] [name] Rivera Kaitelyn Jamison: Treating Canda Podgorski/Extender: [name] [name] Rivera in Treatment: 8 Active Inactive Nutrition Nursing Diagnoses: Impaired glucose control: actual or potential Potential for alteratiion in Nutrition/Potential for imbalanced  nutrition Goals: Patient/caregiver agrees to and verbalizes understanding of need to obtain nutritional consultation Date Initiated: 05/06/2021 Date Inactivated: 06/04/2021 Target Resolution Date: 05/27/2021 Goal Status: Met Patient/caregiver will maintain therapeutic glucose control Date Initiated: 05/06/2021 Target Resolution Date: 07/30/2021 Goal Status: Active Interventions: Assess HgA1c results as ordered upon admission and as needed Provide education on elevated blood sugars and impact on wound healing Provide education on nutrition Treatment Activities: Patient referred to [name] [name] Rivera Physician for further nutritional evaluation : 05/06/2021 Notes: Wound/Skin Impairment Nursing Diagnoses: Knowledge deficit related to ulceration/compromised skin integrity Goals: Patient/caregiver will verbalize understanding of skin Rivera regimen Date Initiated: 05/06/2021 Target Resolution Date: 07/23/2021 Goal Status: Active Interventions: Assess patient/caregiver ability to obtain necessary supplies Assess patient/caregiver ability to perform ulcer/skin Rivera regimen upon admission and as needed Provide education on ulcer and skin Rivera Treatment Activities: Skin Rivera regimen initiated : 05/06/2021 Topical wound management initiated : 05/06/2021 Notes: Electronic Signature(s) Signed: 06/24/2021 5:49:27 PM By: [name] [name] Rivera Entered By: [name] [name] Rivera on 06/24/2021 12:47:22 -------------------------------------------------------------------------------- Pain Assessment Details Patient Name: Date of Service: [name] [name] Rivera, [name] [name] Rivera 06/24/2021 12:30 PM Medical Record Number: [id number] Patient Account Number: [id number] Date of Birth/Sex: Treating RN: [birth year] (60 y.o. [name] [name] Rivera [name] [name] Rivera Keelon Zurn: [name] [name] Rivera [name] [name] Rivera Andrianna Manalang: Treating Pieter Fooks/Extender: [name] [name] Rivera in Treatment: 8 Active Problems Location of Pain Severity and Description of  Pain Patient Has Paino No Site Locations Rate the pain. Current Pain Level: 0 Pain Management and Medication Current Pain Management: Medication: No Cold Application: No Rest: No Massage: No Activity: No T.E.N.S.: No Heat Application: No Leg drop or elevation: No Is the Current Pain Management Adequate: Adequate How does your wound impact your activities of daily livingo Sleep: No Bathing: No Appetite: No Relationship With Others: No Bladder Continence: No Emotions: No Bowel Continence: No Work: No Toileting: No Drive: No Dressing: No Hobbies: No Electronic Signature(s) Signed: 06/24/2021 5:49:27 PM By: [name] [name] Rivera Entered By: [name] [name] Rivera on 06/24/2021 12:44:37 -------------------------------------------------------------------------------- Patient/Caregiver Education Details Patient Name: Date of Service: [name] [name] Rivera 7/28/2022andnbsp12:30 PM Medical Record Number: [id number] Patient Account Number: [id number] Date of Birth/Gender: Treating RN: 1961-09-18 (60 y.o. [name] [name] Rivera [name] [name] Rivera Physician: [name] [name] Rivera [name] [name] Rivera Physician: Treating Physician/Extender: [name] [name] Rivera in Treatment: 8 Education Assessment Education Provided To: Patient Education Topics Provided Elevated Blood Sugar/ Impact on Healing: Handouts: Elevated Blood Sugars: How Do They Affect Wound Healing Methods: Explain/Verbal Responses: Reinforcements needed Electronic Signature(s) Signed: 06/24/2021 5:49:27 PM By: [name] [name] Rivera Entered By: [name] [name] Rivera on 06/24/2021 12:50:03 -------------------------------------------------------------------------------- Wound Assessment Details Patient Name: Date of Service: [name] [name] Rivera, [name] [name] Rivera 06/24/2021 12:30 PM Medical Record Number: [id number] Patient Account Number: [id number] Date of Birth/Sex: Treating RN: 08-02-1961 (60 y.o. [name] [name] Rivera [name] [name] Rivera Eddye Broxterman: [name] [name] Rivera Debra  Clinician: Referring Landra Howze: Treating Xyler Terpening/Extender: [name] [name] Rivera in Treatment: 8 Wound Status Wound Number: 13 [name] Diabetic Wound/Ulcer of the Lower Extremity Etiology: Wound Location: Right, Posterior T Great oe Wound Open Wounding Event: Gradually Appeared Status: Date Acquired: 04/05/2021 Comorbid Anemia, Asthma, Hypertension, Type II Diabetes, Osteomyelitis, Weeks Of Treatment: 8 History: Neuropathy, Confinement Anxiety Clustered Wound: No Photos Wound Measurements Length: (cm) 0.1 Width: (cm) 0.4 Depth: (cm) 0.3 Area: (cm) 0.031 Volume: (cm) 0.009 % Reduction in Area: 34% % Reduction in Volume: 35.7% Epithelialization: None Tunneling: No Undermining: No Wound Description Classification: Grade 1 Wound Margin: Well defined, not attached Exudate Amount: Medium Exudate Type: Sanguinous Exudate Color: red Foul Odor After Cleansing: No Slough/Fibrino No Wound Bed Granulation Amount: Large (67-100%) Exposed Structure Granulation Quality: Pink, Pale Fascia Exposed: No Necrotic Amount: None Present (0%) Fat Layer (Subcutaneous Tissue) Exposed: Yes Tendon Exposed: No Muscle Exposed: No Joint Exposed: No Bone Exposed: No Assessment Notes Periwound callous present. Treatment Notes Wound #13 (Toe Great) Wound Laterality: Right, Posterior Cleanser Wound Cleanser Discharge Instruction: Cleanse the wound with wound cleanser prior to applying a clean dressing using gauze sponges, not tissue or cotton balls. Peri-Wound Rivera Topical [name] Dressing KerraCel Ag Gelling Fiber Dressing, 4x5 in (silver alginate) Discharge Instruction: Apply silver alginate to wound bed as instructed Secondary Dressing Woven Gauze Sponge, Non-Sterile 4x4 in Discharge Instruction: Apply over [name] dressing as directed. Optifoam Non-Adhesive Dressing, 4x4 in Discharge Instruction: **Foam donut***Apply over [name] dressing as directed. Secured  With Conforming Stretch Gauze Bandage, Sterile 2x75 (in/in) Discharge Instruction: Secure with stretch gauze as directed. 72M Medipore H Soft Cloth Surgical T ape, 2x2 (in/yd) Discharge Instruction: Secure dressing with tape as directed. Compression Wrap Compression Stockings Add-Ons Electronic Signature(s) Signed: 06/24/2021 5:49:27 PM By: [name] [name] Rivera Entered By: [name] [name] Rivera on 06/24/2021 12:43:46 -------------------------------------------------------------------------------- Vitals Details Patient Name: Date of Service: [name] [name] Rivera, [name] [name] Rivera 06/24/2021 12:30 PM Medical Record Number: [id number] Patient Account Number: [id number] Date of Birth/Sex: Treating RN: 12-18-1960 (60 y.o. [name] [name] Rivera [name] [name] Rivera Sheenah Dimitroff: [name] [name] Rivera [name] [name] Rivera Gracee Ratterree: Treating  Aashvi Rezabek/Extender: Kalman Shan Masters, Katie Weeks in Treatment: 8 Vital Signs Time Taken: 12:38 Temperature (F): 98.5 Pulse (bpm): 97 Respiratory Rate (breaths/min): 20 Blood Pressure (mmHg): 119/65 Reference Range: 80 - 120 mg / dl Electronic Signature(s) Signed: 06/24/2021 5:49:27 PM By: [name] [name] Rivera Entered By: [name] [name] Rivera on 06/24/2021 12:44:29

## 2021-06-24 NOTE — Progress Notes (Signed)
Virtual Visit via Telephone Note  I connected with [name] on 06/24/21 at  9:00 AM EDT by telephone and verified that I am speaking with the correct person using two identifiers.  Location: Patient: Home Provider: [facility]   I discussed the limitations, risks, security and privacy concerns of performing an evaluation and management service by telephone and the availability of in person appointments. I also discussed with the patient that there may be a patient responsible charge related to this service. The patient expressed understanding and agreed to proceed.   History of Present Illness: I called and spoke with [name] today.  She completed 4 weeks of ciprofloxacin on 05/22/2021 for possible right great toe osteomyelitis.  She saw Dr. Heber [place] in the wound clinic recently and was told that her toe was looking "much better".  Dr. Jodene Nam note indicated that there was no sign of infection.  Observations/Objective:   Assessment and Plan: I am hopeful that her infection has been cured.  Follow Up Instructions: Observe off of antibiotics and follow-up here as needed   I discussed the assessment and treatment plan with the patient. The patient was provided an opportunity to ask questions and all were answered. The patient agreed with the plan and demonstrated an understanding of the instructions.   The patient was advised to call back or seek an in-person evaluation if the symptoms worsen or if the condition fails to improve as anticipated.  I provided 14 minutes of non-face-to-face time during this encounter.   Michel Bickers, MD

## 2021-06-29 ENCOUNTER — Encounter (HOSPITAL_COMMUNITY): Payer: Self-pay | Admitting: Family Medicine

## 2021-06-29 ENCOUNTER — Inpatient Hospital Stay (HOSPITAL_COMMUNITY): Payer: Medicaid Other

## 2021-06-29 ENCOUNTER — Inpatient Hospital Stay (HOSPITAL_COMMUNITY)
Admission: EM | Admit: 2021-06-29 | Discharge: 2021-07-01 | DRG: 178 | Disposition: A | Discharge status: Home / Self Care | Payer: Medicaid Other | Attending: Family Medicine | Admitting: Family Medicine

## 2021-06-29 ENCOUNTER — Other Ambulatory Visit: Payer: Self-pay

## 2021-06-29 DIAGNOSIS — I10 Essential (primary) hypertension: Secondary | ICD-10-CM | POA: Diagnosis present

## 2021-06-29 DIAGNOSIS — E785 Hyperlipidemia, unspecified: Secondary | ICD-10-CM | POA: Diagnosis present

## 2021-06-29 DIAGNOSIS — Z886 Allergy status to analgesic agent status: Secondary | ICD-10-CM | POA: Diagnosis not present

## 2021-06-29 DIAGNOSIS — E11621 Type 2 diabetes mellitus with foot ulcer: Secondary | ICD-10-CM | POA: Diagnosis present

## 2021-06-29 DIAGNOSIS — R6510 Systemic inflammatory response syndrome (SIRS) of non-infectious origin without acute organ dysfunction: Secondary | ICD-10-CM | POA: Diagnosis present

## 2021-06-29 DIAGNOSIS — E861 Hypovolemia: Secondary | ICD-10-CM | POA: Diagnosis present

## 2021-06-29 DIAGNOSIS — Z87891 Personal history of nicotine dependence: Secondary | ICD-10-CM

## 2021-06-29 DIAGNOSIS — Z794 Long term (current) use of insulin: Secondary | ICD-10-CM | POA: Diagnosis not present

## 2021-06-29 DIAGNOSIS — Z7984 Long term (current) use of oral hypoglycemic drugs: Secondary | ICD-10-CM | POA: Diagnosis not present

## 2021-06-29 DIAGNOSIS — R739 Hyperglycemia, unspecified: Secondary | ICD-10-CM | POA: Diagnosis present

## 2021-06-29 DIAGNOSIS — I152 Hypertension secondary to endocrine disorders: Secondary | ICD-10-CM | POA: Diagnosis present

## 2021-06-29 DIAGNOSIS — Z833 Family history of diabetes mellitus: Secondary | ICD-10-CM

## 2021-06-29 DIAGNOSIS — R7401 Elevation of levels of liver transaminase levels: Secondary | ICD-10-CM

## 2021-06-29 DIAGNOSIS — E1169 Type 2 diabetes mellitus with other specified complication: Secondary | ICD-10-CM | POA: Diagnosis present

## 2021-06-29 DIAGNOSIS — Z880 Allergy status to penicillin: Secondary | ICD-10-CM

## 2021-06-29 DIAGNOSIS — E1142 Type 2 diabetes mellitus with diabetic polyneuropathy: Secondary | ICD-10-CM

## 2021-06-29 DIAGNOSIS — E1159 Type 2 diabetes mellitus with other circulatory complications: Secondary | ICD-10-CM | POA: Diagnosis not present

## 2021-06-29 DIAGNOSIS — E1165 Type 2 diabetes mellitus with hyperglycemia: Secondary | ICD-10-CM | POA: Diagnosis present

## 2021-06-29 DIAGNOSIS — E08621 Diabetes mellitus due to underlying condition with foot ulcer: Secondary | ICD-10-CM

## 2021-06-29 DIAGNOSIS — U071 COVID-19: Secondary | ICD-10-CM | POA: Diagnosis present

## 2021-06-29 DIAGNOSIS — Z8249 Family history of ischemic heart disease and other diseases of the circulatory system: Secondary | ICD-10-CM

## 2021-06-29 DIAGNOSIS — Z79899 Other long term (current) drug therapy: Secondary | ICD-10-CM

## 2021-06-29 DIAGNOSIS — K219 Gastro-esophageal reflux disease without esophagitis: Secondary | ICD-10-CM | POA: Diagnosis present

## 2021-06-29 DIAGNOSIS — L97529 Non-pressure chronic ulcer of other part of left foot with unspecified severity: Secondary | ICD-10-CM

## 2021-06-29 LAB — CBC WITH DIFFERENTIAL/PLATELET
Abs Immature Granulocytes: 0.03 10*3/uL (ref 0.00–0.07)
Basophils Absolute: 0 10*3/uL (ref 0.0–0.1)
Basophils Relative: 0 %
Eosinophils Absolute: 0 10*3/uL (ref 0.0–0.5)
Eosinophils Relative: 0 %
HCT: 41.6 % (ref 36.0–46.0)
Hemoglobin: 13.4 g/dL (ref 12.0–15.0)
Immature Granulocytes: 0 %
Lymphocytes Relative: 14 %
Lymphs Abs: 1 10*3/uL (ref 0.7–4.0)
MCH: 31.1 pg (ref 26.0–34.0)
MCHC: 32.2 g/dL (ref 30.0–36.0)
MCV: 96.5 fL (ref 80.0–100.0)
Monocytes Absolute: 0.4 10*3/uL (ref 0.1–1.0)
Monocytes Relative: 6 %
Neutro Abs: 5.5 10*3/uL (ref 1.7–7.7)
Neutrophils Relative %: 80 %
Platelets: 198 10*3/uL (ref 150–400)
RBC: 4.31 MIL/uL (ref 3.87–5.11)
RDW: 12.5 % (ref 11.5–15.5)
WBC: 6.9 10*3/uL (ref 4.0–10.5)
nRBC: 0 % (ref 0.0–0.2)

## 2021-06-29 LAB — BLOOD GAS, VENOUS
Acid-base deficit: 2.5 mmol/L — ABNORMAL HIGH (ref 0.0–2.0)
Bicarbonate: 22.7 mmol/L (ref 20.0–28.0)
O2 Saturation: 52.6 %
Patient temperature: 98.6
pCO2, Ven: 43 mmHg — ABNORMAL LOW (ref 44.0–60.0)
pH, Ven: 7.342 (ref 7.250–7.430)
pO2, Ven: 32 mmHg (ref 32.0–45.0)

## 2021-06-29 LAB — CBG MONITORING, ED
Glucose-Capillary: 438 mg/dL — ABNORMAL HIGH (ref 70–99)
Glucose-Capillary: 501 mg/dL (ref 70–99)

## 2021-06-29 LAB — COMPREHENSIVE METABOLIC PANEL
ALT: 56 U/L — ABNORMAL HIGH (ref 0–44)
AST: 43 U/L — ABNORMAL HIGH (ref 15–41)
Albumin: 4.1 g/dL (ref 3.5–5.0)
Alkaline Phosphatase: 70 U/L (ref 38–126)
Anion gap: 21 — ABNORMAL HIGH (ref 5–15)
BUN: 17 mg/dL (ref 6–20)
CO2: 22 mmol/L (ref 22–32)
Calcium: 9.3 mg/dL (ref 8.9–10.3)
Chloride: 90 mmol/L — ABNORMAL LOW (ref 98–111)
Creatinine, Ser: 1.05 mg/dL — ABNORMAL HIGH (ref 0.44–1.00)
GFR, Estimated: >60 mL/min (ref >60)
Glucose, Bld: 441 mg/dL — ABNORMAL HIGH (ref 70–99)
Potassium: 4.5 mmol/L (ref 3.5–5.1)
Sodium: 133 mmol/L — ABNORMAL LOW (ref 135–145)
Total Bilirubin: 1.1 mg/dL (ref 0.3–1.2)
Total Protein: 8.4 g/dL — ABNORMAL HIGH (ref 6.5–8.1)

## 2021-06-29 LAB — LIPASE, BLOOD: Lipase: 27 U/L (ref 11–51)

## 2021-06-29 LAB — RESP PANEL BY RT-PCR (FLU A&B, COVID) ARPGX2
Influenza A by PCR: NEGATIVE
Influenza B by PCR: NEGATIVE
SARS Coronavirus 2 by RT PCR: POSITIVE — AB

## 2021-06-29 LAB — TSH: TSH: 2.129 u[IU]/mL (ref 0.350–4.500)

## 2021-06-29 LAB — LACTIC ACID, PLASMA: Lactic Acid, Venous: 2 mmol/L (ref 0.5–1.9)

## 2021-06-29 LAB — BETA-HYDROXYBUTYRIC ACID: Beta-Hydroxybutyric Acid: 4.13 mmol/L — ABNORMAL HIGH (ref 0.05–0.27)

## 2021-06-29 MED ORDER — LACTATED RINGERS IV BOLUS: 20.0000 mL/kg | Freq: Once | INTRAVENOUS | Status: DC

## 2021-06-29 MED ORDER — DEXTROSE IN LACTATED RINGERS 5 % IV SOLN: INTRAVENOUS | Status: DC

## 2021-06-29 MED ORDER — DEXTROSE 50 % IV SOLN: 0.0000 mL | INTRAVENOUS | Status: DC | PRN

## 2021-06-29 MED ORDER — LACTATED RINGERS IV SOLN: INTRAVENOUS | Status: DC

## 2021-06-29 MED ORDER — ONDANSETRON HCL 4 MG/2ML IJ SOLN
4.0000 mg | Freq: Once | INTRAMUSCULAR | Status: AC
  Administered 2021-06-29: 4 mg via INTRAVENOUS
  Filled 2021-06-29: qty 2

## 2021-06-29 MED ORDER — POTASSIUM CHLORIDE 10 MEQ/100ML IV SOLN
10.0000 meq | INTRAVENOUS | Status: AC
  Administered 2021-06-30 (×2): 10 meq via INTRAVENOUS
  Filled 2021-06-29 (×2): qty 100

## 2021-06-29 MED ORDER — SODIUM CHLORIDE 0.9 % IV BOLUS
1000.0000 mL | Freq: Once | INTRAVENOUS | Status: AC
  Administered 2021-06-29: 1000 mL via INTRAVENOUS

## 2021-06-29 MED ORDER — INSULIN REGULAR(HUMAN) IN NACL 100-0.9 UT/100ML-% IV SOLN: INTRAVENOUS | Status: DC

## 2021-06-29 NOTE — ED Provider Notes (Signed)
[facility] DEPT Provider Note   CSN: [id number] Arrival date & time: 06/29/21  1903     History No chief complaint on file.   [name] is a 60 y.o. female.  The history is provided by the patient and medical records.  Hyperglycemia Blood sugar level PTA:  Over 400 Severity:  Moderate Onset quality:  Gradual Duration:  5 days Timing:  Constant Progression:  Worsening Chronicity:  Recurrent Diabetes status:  Controlled with insulin Context: recent illness   Relieved by:  Nothing Ineffective treatments:  None tried Associated symptoms: fatigue, increased thirst, malaise, nausea, polyuria and vomiting   Associated symptoms: no abdominal pain, no altered mental status, no blurred vision, no chest pain, no confusion, no dysuria, no fever, no shortness of breath and no weakness       Past Medical History:  Diagnosis Date   Anemia    Asthma 11/01/2010   BPPV (benign paroxysmal positional vertigo) 03/07/2013   Diabetes mellitus    since 1983   Elevated TSH 05/13/2013   GERD (gastroesophageal reflux disease)    Hypertension    Left elbow pain 05/05/2016   Left shoulder pain 05/05/2016   Osteomyelitis ([place])    Pneumonia    as a child   Wrist pain 04/11/2013    Patient Active Problem List   Diagnosis Date Noted   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   Gangrene of toe of right foot ([place]) 04/14/2021   Thoracic outlet syndrome 07/12/2020   Shoulder pain 05/29/2020   Neck pain 05/06/2020   Colon cancer screening 05/05/2020   Encounter for screening mammogram for malignant neoplasm of breast 05/05/2020   Chest pain 02/24/2020   Strain of lumbar paraspinal muscle, initial encounter 09/10/2019   Hand pain, right 03/14/2019   Diabetes ([place]) 07/26/2018   Long term current use of opiate analgesic 01/31/2017   Opioid dependence ([place]) 11/27/2016   [place] neuroma 11/02/2016   Broken teeth 11/02/2016   Hyperlipidemia associated with type 2 diabetes mellitus  ([place]) 04/22/2016   Severe obesity (BMI 35.0-39.9) 01/22/2016   GERD (gastroesophageal reflux disease) 03/03/2014   Health care maintenance 01/06/2014   Neuropathy in diabetes ([place]) 11/01/2010   ASTHMA 11/01/2010   Hypertension associated with diabetes ([place]) 11/28/2008   Diabetic osteomyelitis ([place]) 11/28/1988    Past Surgical History:  Procedure Laterality Date   ACHILLES TENDON LENGTHENING Left 08/29/2014   Procedure: Left Achilles Lengthening;  Surgeon: Newt Minion, MD;  Location: [facility];  Service: Orthopedics;  Laterality: Left;   AMPUTATION Left 02/19/2013   Procedure: Amputation of Left Great Toe;  Surgeon: Mcarthur Rossetti, MD;  Location: [facility];  Service: Orthopedics;  Laterality: Left;   AMPUTATION Left 08/29/2014   Procedure: Left Foot 2nd and 1st Toe Amputation;  Surgeon: Newt Minion, MD;  Location: [facility];  Service: Orthopedics;  Laterality: Left;   CESAREAN SECTION     x 4   TUBAL LIGATION       OB History   No obstetric history on file.     Family History  Problem Relation Age of Onset   Diabetes Father    Hypertension Father    Hypertension Mother    Dementia Mother    Diabetes Daughter     Social History   Tobacco Use   Smoking status: Former    Types: Cigarettes    Quit date: 11/28/1962    Years since quitting: 58.6   Smokeless tobacco: Never  Vaping Use   Vaping Use:  Never used  Substance Use Topics   Alcohol use: No    Alcohol/week: 0.0 standard drinks   Drug use: No    Home Medications Prior to Admission medications   Medication Sig Start Date End Date Taking? Authorizing Provider  atorvastatin (LIPITOR) 40 MG tablet Take 1 tablet (40 mg total) by mouth daily. 07/10/20   Madalyn Rob, MD  blood glucose meter kit and supplies KIT Dispense based on patient and insurance preference. Use up to four times daily as directed. (FOR ICD-9 250.00, 250.01). 07/09/18   Jean Rosenthal, MD  diclofenac Sodium (VOLTAREN) 1 % GEL Apply 4 g topically 4 (four)  times daily. Patient taking differently: Apply 4 g topically daily as needed (For pain). 07/12/20   Madalyn Rob, MD  gabapentin (NEURONTIN) 300 MG capsule Take 3 capsules (900 mg total) by mouth 3 (three) times daily. 07/12/20   Madalyn Rob, MD  glucose blood test strip USE TO CHECK BLOOD SUGAR 4 TIMES DAILY. Patient taking differently: USE TO CHECK BLOOD SUGAR 4 TIMES DAILY. 04/13/21 05/06/22  Asencion Noble, MD  hydrochlorothiazide (HYDRODIURIL) 25 MG tablet Take 1 tablet (25 mg total) by mouth daily. 04/22/21   Axel Filler, MD  insulin glargine (LANTUS) 100 UNIT/ML Solostar Pen Inject 70 Units into the skin at bedtime. 04/29/21   Cato Mulligan, MD  insulin lispro (HUMALOG) 100 UNIT/ML KwikPen Inject 18 Units into the skin 3 (three) times daily with meals. 04/29/21   Cato Mulligan, MD  Lancets MISC 1 each by Does not apply route 4 (four) times daily -  before meals and at bedtime. 09/30/15   Burns, Arloa Koh, MD  lisinopril (ZESTRIL) 5 MG tablet Take 1 tablet (5 mg total) by mouth daily. 01/01/21   Virl Axe, MD  metFORMIN (GLUCOPHAGE-XR) 500 MG 24 hr tablet Take 2 tablets (1,000 mg total) by mouth 2 (two) times daily with a meal. 01/01/21   Virl Axe, MD  Multiple Vitamin (MULTIVITAMIN WITH MINERALS) TABS tablet Take 1 tablet by mouth every evening.     [provider]  omeprazole (PRILOSEC) 20 MG capsule Take 1 capsule (20 mg total) by mouth daily. 05/04/20   Katherine Roan, MD    Allergies    Aspirin, Ibuprofen, and Penicillins  Review of Systems   Review of Systems  Constitutional:  Positive for fatigue. Negative for chills and fever.  HENT:  Negative for congestion.   Eyes:  Negative for blurred vision and visual disturbance.  Respiratory:  Negative for cough, chest tightness, shortness of breath and wheezing.   Cardiovascular:  Negative for chest pain.  Gastrointestinal:  Positive for nausea and vomiting. Negative for abdominal pain, constipation and  diarrhea.  Endocrine: Positive for polydipsia and polyuria.  Genitourinary:  Positive for frequency. Negative for dysuria and flank pain.  Musculoskeletal:  Negative for back pain and neck pain.  Skin:  Negative for rash and wound.  Neurological:  Negative for weakness, light-headedness and headaches.  Psychiatric/Behavioral:  Negative for agitation and confusion.   All other systems reviewed and are negative.  Physical Exam Updated Vital Signs LMP 06/23/2014   Physical Exam Vitals and nursing note reviewed.  Constitutional:      General: She is not in acute distress.    Appearance: She is well-developed. She is not ill-appearing, toxic-appearing or diaphoretic.  HENT:     Head: Normocephalic and atraumatic.     Nose: No congestion or rhinorrhea.     Mouth/Throat:     Mouth:  Mucous membranes are dry.     Pharynx: No oropharyngeal exudate or posterior oropharyngeal erythema.  Eyes:     Extraocular Movements: Extraocular movements intact.     Conjunctiva/sclera: Conjunctivae normal.     Pupils: Pupils are equal, round, and reactive to light.  Cardiovascular:     Rate and Rhythm: Normal rate and regular rhythm.     Heart sounds: No murmur heard. Pulmonary:     Effort: Pulmonary effort is normal. No respiratory distress.     Breath sounds: Normal breath sounds. No wheezing, rhonchi or rales.  Chest:     Chest wall: No tenderness.  Abdominal:     Palpations: Abdomen is soft.     Tenderness: There is no abdominal tenderness. There is no right CVA tenderness, left CVA tenderness, guarding or rebound.  Musculoskeletal:        General: No tenderness.     Cervical back: Neck supple.     Right lower leg: No edema.     Left lower leg: No edema.  Skin:    General: Skin is warm and dry.     Findings: No erythema.  Neurological:     General: No focal deficit present.     Mental Status: She is alert. Mental status is at baseline.     Cranial Nerves: No cranial nerve deficit.      Sensory: No sensory deficit.     Motor: No weakness.  Psychiatric:        Mood and Affect: Mood normal.    ED Results / Procedures / Treatments   Labs (all labs ordered are listed, but only abnormal results are displayed) Labs Reviewed  RESP PANEL BY RT-PCR (FLU A&B, COVID) ARPGX2 - Abnormal; Notable for the following components:      Result Value   SARS Coronavirus 2 by RT PCR POSITIVE (*)    All other components within normal limits  COMPREHENSIVE METABOLIC PANEL - Abnormal; Notable for the following components:   Sodium 133 (*)    Chloride 90 (*)    Glucose, Bld 441 (*)    Creatinine, Ser 1.05 (*)    Total Protein 8.4 (*)    AST 43 (*)    ALT 56 (*)    Anion gap 21 (*)    All other components within normal limits  BETA-HYDROXYBUTYRIC ACID - Abnormal; Notable for the following components:   Beta-Hydroxybutyric Acid 4.13 (*)    All other components within normal limits  LACTIC ACID, PLASMA - Abnormal; Notable for the following components:   Lactic Acid, Venous 2.0 (*)    All other components within normal limits  BLOOD GAS, VENOUS - Abnormal; Notable for the following components:   pCO2, Ven 43.0 (*)    Acid-base deficit 2.5 (*)    All other components within normal limits  CBG MONITORING, ED - Abnormal; Notable for the following components:   Glucose-Capillary 438 (*)    All other components within normal limits  CBG MONITORING, ED - Abnormal; Notable for the following components:   Glucose-Capillary 501 (*)    All other components within normal limits  URINE CULTURE  CULTURE, BLOOD (ROUTINE X 2)  CULTURE, BLOOD (ROUTINE X 2)  CBC WITH DIFFERENTIAL/PLATELET  LIPASE, BLOOD  TSH  LACTIC ACID, PLASMA  URINALYSIS, ROUTINE W REFLEX MICROSCOPIC  BASIC METABOLIC PANEL  BASIC METABOLIC PANEL  BASIC METABOLIC PANEL  BASIC METABOLIC PANEL  OSMOLALITY  PROCALCITONIN  BASIC METABOLIC PANEL  I-STAT BETA HCG BLOOD, ED ([facility], [facility], [facility]  ONLY)    EKG None  Radiology DG CHEST  PORT 1 VIEW  Result Date: 06/29/2021 CLINICAL DATA:  Weakness EXAM: PORTABLE CHEST 1 VIEW COMPARISON:  02/24/2020 FINDINGS: The heart size and mediastinal contours are within normal limits. Both lungs are clear. The visualized skeletal structures are unremarkable. IMPRESSION: No active disease. Electronically Signed   By: Donavan Foil M.D.   On: 06/29/2021 23:34    Procedures Procedures   CRITICAL CARE Performed by: Gwenyth Allegra Charmika Macdonnell Total critical care time: 35 minutes Critical care time was exclusive of separately billable procedures and treating other patients. Critical care was necessary to treat or prevent imminent or life-threatening deterioration. Critical care was time spent personally by me on the following activities: development of treatment plan with patient and/or surrogate as well as nursing, discussions with consultants, evaluation of patient's response to treatment, examination of patient, obtaining history from patient or surrogate, ordering and performing treatments and interventions, ordering and review of laboratory studies, ordering and review of radiographic studies, pulse oximetry and re-evaluation of patient's condition.  Medications Ordered in ED Medications  lactated ringers bolus 2,612 mL (has no administration in time range)  insulin regular, human (MYXREDLIN) 100 units/ 100 mL infusion (has no administration in time range)  lactated ringers infusion (has no administration in time range)  dextrose 5 % in lactated ringers infusion (0 mLs Intravenous Hold 06/29/21 2346)  dextrose 50 % solution 0-50 mL (has no administration in time range)  potassium chloride 10 mEq in 100 mL IVPB (has no administration in time range)  sodium chloride 0.9 % bolus 1,000 mL (1,000 mLs Intravenous New Bag/Given 06/29/21 2037)  ondansetron (ZOFRAN) injection 4 mg (4 mg Intravenous Given 06/29/21 2152)    ED Course  I have reviewed the triage vital signs and the nursing  notes.  Pertinent labs & imaging results that were available during my care of the patient were reviewed by me and considered in my medical decision making (see chart for details).    MDM Rules/Calculators/A&P                           [name] is a 60 y.o. female with a past medical history significant for hypertension, diabetes, asthma, GERD, hyperlipidemia, previous osteomyelitis, and obesity who presents with hyperglycemia, nausea, vomiting, urinary frequency, fatigue, dry cough, and several falls.  Patient reports that since the weekend, she has been having significant fatigue and overall weakness with no energy.  She reports she has had nausea and vomiting and has not been able to take her insulin due to this.  She reports her glucose have been high at home greater than 400.  She has not been like this for over a year.  She reports no constipation or diarrhea.  She reports her urine has been more frequent but is not burning.  She does report a dry cough but has no documented fevers or chills.  She reports no chest pain, shortness of breath, palpitations, or syncope.  She reports feeling fatigued and had several falls without any reported injuries.  On arrival, patient is appearing dehydrated.  Mucous membranes are dry.  Lungs are clear and chest is nontender.  Abdomen is nontender.  Bowel sounds appreciated.  Patient moving all extremities.  Normal sensation in extremities.  Good pulses in extremities.  Clinically I am concerned about hypoglycemia and dehydration.  We will get work-up to rule out DKA but will also get work-up to  rule out pneumonia, UTI, or other infectious cause of hypoglycemia.  She is not having headaches or neck stiffness, doubt meningitis or other intracranial problem.  We will get a COVID swab due to the coughing.  Anticipate reassessment after work-up but will give some fluids and nausea medicine initially.      Patient was found to have fever, tachycardia, and we  are concerned about possible infection.  COVID test is in process.  Labs began to return and patient does have an anion gap of 21.  I am concerned about early DKA.  Beta hydroxy uric acid was elevated.  Insulin drip and fluids started.    Patient be admitted for hyperglycemia and possible early DKA in the setting of likely viral infection.  Urinalysis is also still in process.  Patient be admitted for further management.   Final Clinical Impression(s) / ED Diagnoses Final diagnoses:  COVID-19    Clinical Impression: 1. COVID-19   2. SIRS (systemic inflammatory response syndrome) (HCC)     Disposition: Admit  This note was prepared with assistance of Dragon voice recognition software. Occasional wrong-word or sound-a-like substitutions may have occurred due to the inherent limitations of voice recognition software.     Taliah Porche, Gwenyth Allegra, MD 06/30/21 0002

## 2021-06-29 NOTE — ED Triage Notes (Signed)
Per EMS pt from home, generalized weakness for 4 days. 2 falls in the last 2 days with no injuries. Nausea and increased urination frequency today. Decreased appetite and has not been taking insulin. Currently living in a hotel room. 126/74 120HR 24 R 97 RA  CBG 475

## 2021-06-30 ENCOUNTER — Encounter (HOSPITAL_COMMUNITY): Payer: Self-pay | Admitting: Family Medicine

## 2021-06-30 DIAGNOSIS — R7401 Elevation of levels of liver transaminase levels: Secondary | ICD-10-CM | POA: Insufficient documentation

## 2021-06-30 DIAGNOSIS — U071 COVID-19: Secondary | ICD-10-CM

## 2021-06-30 LAB — CBG MONITORING, ED
Glucose-Capillary: 371 mg/dL — ABNORMAL HIGH (ref 70–99)
Glucose-Capillary: 294 mg/dL — ABNORMAL HIGH (ref 70–99)
Glucose-Capillary: 252 mg/dL — ABNORMAL HIGH (ref 70–99)
Glucose-Capillary: 174 mg/dL — ABNORMAL HIGH (ref 70–99)
Glucose-Capillary: 204 mg/dL — ABNORMAL HIGH (ref 70–99)
Glucose-Capillary: 190 mg/dL — ABNORMAL HIGH (ref 70–99)
Glucose-Capillary: 213 mg/dL — ABNORMAL HIGH (ref 70–99)

## 2021-06-30 LAB — BASIC METABOLIC PANEL
Anion gap: 12 (ref 5–15)
Anion gap: 15 (ref 5–15)
BUN: 14 mg/dL (ref 6–20)
BUN: 14 mg/dL (ref 6–20)
CO2: 19 mmol/L — ABNORMAL LOW (ref 22–32)
CO2: 21 mmol/L — ABNORMAL LOW (ref 22–32)
Calcium: 8.2 mg/dL — ABNORMAL LOW (ref 8.9–10.3)
Calcium: 8.4 mg/dL — ABNORMAL LOW (ref 8.9–10.3)
Chloride: 102 mmol/L (ref 98–111)
Chloride: 97 mmol/L — ABNORMAL LOW (ref 98–111)
Creatinine, Ser: 0.82 mg/dL (ref 0.44–1.00)
Creatinine, Ser: 0.95 mg/dL (ref 0.44–1.00)
GFR, Estimated: >60 mL/min (ref >60)
GFR, Estimated: >60 mL/min (ref >60)
Glucose, Bld: 262 mg/dL — ABNORMAL HIGH (ref 70–99)
Glucose, Bld: 372 mg/dL — ABNORMAL HIGH (ref 70–99)
Potassium: 3.8 mmol/L (ref 3.5–5.1)
Potassium: 4.5 mmol/L (ref 3.5–5.1)
Sodium: 131 mmol/L — ABNORMAL LOW (ref 135–145)
Sodium: 135 mmol/L (ref 135–145)

## 2021-06-30 LAB — C-REACTIVE PROTEIN: CRP: 16.6 mg/dL — ABNORMAL HIGH (ref <1.0)

## 2021-06-30 LAB — URINALYSIS, ROUTINE W REFLEX MICROSCOPIC
Bacteria, UA: NONE SEEN
Bilirubin Urine: NEGATIVE
Glucose, UA: >=500 mg/dL — AB
Ketones, ur: 80 mg/dL — AB
Leukocytes,Ua: NEGATIVE
Nitrite: NEGATIVE
Protein, ur: 100 mg/dL — AB
Specific Gravity, Urine: 1.027 (ref 1.005–1.030)
pH: 5 (ref 5.0–8.0)

## 2021-06-30 LAB — GLUCOSE, CAPILLARY
Glucose-Capillary: 191 mg/dL — ABNORMAL HIGH (ref 70–99)
Glucose-Capillary: 202 mg/dL — ABNORMAL HIGH (ref 70–99)
Glucose-Capillary: 283 mg/dL — ABNORMAL HIGH (ref 70–99)
Glucose-Capillary: 217 mg/dL — ABNORMAL HIGH (ref 70–99)
Glucose-Capillary: 244 mg/dL — ABNORMAL HIGH (ref 70–99)

## 2021-06-30 LAB — HEMOGLOBIN A1C
Hgb A1c MFr Bld: 9.6 % — ABNORMAL HIGH (ref 4.8–5.6)
Mean Plasma Glucose: 228.82 mg/dL

## 2021-06-30 LAB — I-STAT BETA HCG BLOOD, ED (MC, WL, AP ONLY): I-stat hCG, quantitative: <5 m[IU]/mL (ref <5)

## 2021-06-30 LAB — LACTIC ACID, PLASMA: Lactic Acid, Venous: 1.2 mmol/L (ref 0.5–1.9)

## 2021-06-30 LAB — HIV ANTIBODY (ROUTINE TESTING W REFLEX): HIV Screen 4th Generation wRfx: NONREACTIVE

## 2021-06-30 LAB — MRSA NEXT GEN BY PCR, NASAL: MRSA by PCR Next Gen: NOT DETECTED

## 2021-06-30 LAB — BRAIN NATRIURETIC PEPTIDE: B Natriuretic Peptide: 21 pg/mL (ref 0.0–100.0)

## 2021-06-30 LAB — LACTATE DEHYDROGENASE: LDH: 260 U/L — ABNORMAL HIGH (ref 98–192)

## 2021-06-30 LAB — D-DIMER, QUANTITATIVE: D-Dimer, Quant: 2.77 ug/mL-FEU — ABNORMAL HIGH (ref 0.00–0.50)

## 2021-06-30 LAB — PROCALCITONIN: Procalcitonin: 3.84 ng/mL

## 2021-06-30 LAB — OSMOLALITY: Osmolality: 302 mOsm/kg — ABNORMAL HIGH (ref 275–295)

## 2021-06-30 MED ORDER — CHLORHEXIDINE GLUCONATE CLOTH 2 % EX PADS
6.0000 | MEDICATED_PAD | Freq: Every day | CUTANEOUS | Status: DC
  Administered 2021-06-30 – 2021-07-01 (×2): 6 via TOPICAL

## 2021-06-30 MED ORDER — INSULIN ASPART 100 UNIT/ML IJ SOLN
8.0000 [IU] | Freq: Three times a day (TID) | INTRAMUSCULAR | Status: DC
  Administered 2021-06-30 – 2021-07-01 (×4): 8 [IU] via SUBCUTANEOUS
  Filled 2021-06-30: qty 0.08

## 2021-06-30 MED ORDER — INSULIN GLARGINE-YFGN 100 UNIT/ML ~~LOC~~ SOLN: 70.0000 [IU] | Freq: Every day | SUBCUTANEOUS | Status: DC

## 2021-06-30 MED ORDER — LACTATED RINGERS IV SOLN: INTRAVENOUS | Status: DC

## 2021-06-30 MED ORDER — DICLOFENAC SODIUM 1 % EX GEL: 4.0000 g | Freq: Every day | CUTANEOUS | Status: DC | PRN

## 2021-06-30 MED ORDER — TRAMADOL HCL 50 MG PO TABS
50.0000 mg | ORAL_TABLET | Freq: Three times a day (TID) | ORAL | Status: DC | PRN
Start: 2021-06-30 — End: 2021-07-01
  Administered 2021-06-30: 50 mg via ORAL
  Filled 2021-06-30: qty 1

## 2021-06-30 MED ORDER — ATORVASTATIN CALCIUM 40 MG PO TABS
40.0000 mg | ORAL_TABLET | Freq: Every day | ORAL | Status: DC
  Administered 2021-06-30: 40 mg via ORAL
  Filled 2021-06-30: qty 1

## 2021-06-30 MED ORDER — PANTOPRAZOLE SODIUM 40 MG PO TBEC
40.0000 mg | DELAYED_RELEASE_TABLET | Freq: Every day | ORAL | Status: DC
  Administered 2021-06-30 – 2021-07-01 (×2): 40 mg via ORAL
  Filled 2021-06-30 (×2): qty 1

## 2021-06-30 MED ORDER — INSULIN ASPART 100 UNIT/ML IJ SOLN
0.0000 [IU] | Freq: Three times a day (TID) | INTRAMUSCULAR | Status: DC
  Administered 2021-06-30: 8 [IU] via SUBCUTANEOUS
  Administered 2021-06-30: 3 [IU] via SUBCUTANEOUS
  Administered 2021-06-30: 5 [IU] via SUBCUTANEOUS
  Administered 2021-07-01: 15 [IU] via SUBCUTANEOUS
  Administered 2021-07-01: 8 [IU] via SUBCUTANEOUS
  Filled 2021-06-30: qty 0.15

## 2021-06-30 MED ORDER — INSULIN GLARGINE-YFGN 100 UNIT/ML ~~LOC~~ SOLN
70.0000 [IU] | Freq: Every day | SUBCUTANEOUS | Status: DC
  Administered 2021-06-30: 70 [IU] via SUBCUTANEOUS
  Filled 2021-06-30: qty 0.7

## 2021-06-30 MED ORDER — DEXTROSE IN LACTATED RINGERS 5 % IV SOLN: INTRAVENOUS | Status: DC

## 2021-06-30 MED ORDER — SODIUM CHLORIDE 0.9 % IV SOLN
100.0000 mg | INTRAVENOUS | Status: AC
  Administered 2021-06-30 (×2): 100 mg via INTRAVENOUS
  Filled 2021-06-30 (×2): qty 20

## 2021-06-30 MED ORDER — DEXTROSE 50 % IV SOLN: 0.0000 mL | INTRAVENOUS | Status: DC | PRN

## 2021-06-30 MED ORDER — GABAPENTIN 300 MG PO CAPS
900.0000 mg | ORAL_CAPSULE | Freq: Three times a day (TID) | ORAL | Status: DC
  Administered 2021-06-30 – 2021-07-01 (×4): 900 mg via ORAL
  Filled 2021-06-30: qty 9
  Filled 2021-06-30 (×3): qty 3

## 2021-06-30 MED ORDER — ALUM & MAG HYDROXIDE-SIMETH 200-200-20 MG/5ML PO SUSP
30.0000 mL | ORAL | Status: DC | PRN
  Administered 2021-06-30: 30 mL via ORAL
  Filled 2021-06-30: qty 30

## 2021-06-30 MED ORDER — SODIUM CHLORIDE 0.9 % IV SOLN: 100.0000 mg | Freq: Every day | INTRAVENOUS | Status: DC

## 2021-06-30 MED ORDER — GUAIFENESIN-DM 100-10 MG/5ML PO SYRP
10.0000 mL | ORAL_SOLUTION | ORAL | Status: DC | PRN
  Filled 2021-06-30: qty 10

## 2021-06-30 MED ORDER — INSULIN REGULAR(HUMAN) IN NACL 100-0.9 UT/100ML-% IV SOLN
INTRAVENOUS | Status: DC
  Administered 2021-06-30: 10 [IU]/h via INTRAVENOUS
  Administered 2021-06-30: 15 [IU]/h via INTRAVENOUS
  Filled 2021-06-30: qty 100

## 2021-06-30 MED ORDER — PREDNISONE 20 MG PO TABS: 50.0000 mg | ORAL_TABLET | Freq: Every day | ORAL | Status: DC

## 2021-06-30 MED ORDER — ACETAMINOPHEN 325 MG PO TABS: 650.0000 mg | ORAL_TABLET | Freq: Four times a day (QID) | ORAL | Status: DC | PRN

## 2021-06-30 MED ORDER — SODIUM CHLORIDE 0.9 % IV SOLN
100.0000 mg | Freq: Every day | INTRAVENOUS | Status: DC
  Administered 2021-07-01: 100 mg via INTRAVENOUS
  Filled 2021-06-30: qty 20

## 2021-06-30 MED ORDER — METHYLPREDNISOLONE SODIUM SUCC 125 MG IJ SOLR
0.5000 mg/kg | Freq: Two times a day (BID) | INTRAMUSCULAR | Status: DC
  Administered 2021-06-30 – 2021-07-01 (×3): 65.625 mg via INTRAVENOUS
  Filled 2021-06-30 (×3): qty 2

## 2021-06-30 MED ORDER — ENOXAPARIN SODIUM 40 MG/0.4ML IJ SOSY
40.0000 mg | PREFILLED_SYRINGE | INTRAMUSCULAR | Status: DC
  Administered 2021-06-30 – 2021-07-01 (×2): 40 mg via SUBCUTANEOUS
  Filled 2021-06-30 (×2): qty 0.4

## 2021-06-30 MED ORDER — INSULIN GLARGINE-YFGN 100 UNIT/ML ~~LOC~~ SOLN
45.0000 [IU] | Freq: Every day | SUBCUTANEOUS | Status: DC
  Administered 2021-06-30: 45 [IU] via SUBCUTANEOUS
  Filled 2021-06-30: qty 0.45

## 2021-06-30 MED ORDER — ORAL CARE MOUTH RINSE
15.0000 mL | Freq: Two times a day (BID) | OROMUCOSAL | Status: DC
  Administered 2021-06-30 – 2021-07-01 (×3): 15 mL via OROMUCOSAL

## 2021-06-30 MED ORDER — INSULIN ASPART 100 UNIT/ML IJ SOLN
0.0000 [IU] | Freq: Every day | INTRAMUSCULAR | Status: DC
  Administered 2021-06-30: 2 [IU] via SUBCUTANEOUS
  Filled 2021-06-30: qty 0.05

## 2021-06-30 MED ORDER — SODIUM CHLORIDE 0.9 % IV SOLN: 200.0000 mg | Freq: Once | INTRAVENOUS | Status: DC

## 2021-06-30 NOTE — ED Notes (Signed)
Report sent to floor nurse.  

## 2021-06-30 NOTE — Evaluation (Signed)
Physical Therapy Evaluation Patient Details Name: [name] MRN: [id number] DOB: 1961/05/29 Today's Date: 06/30/2021   History of Present Illness  [name] is a 60 y.o. 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 Admitted with COVID and uncontrolled hyperglycemia.  Clinical Impression  The patient  reports feeling fatigued, having been OOB for several hours. Patient stood and ambulated to bed x 6'. Patient had ambulated in rooom earlier with OT. Patient should progress to return home.  Pt admitted with above diagnosis.  Pt currently with functional limitations due to the deficits listed below (see PT Problem List). Pt will benefit from skilled PT to increase their independence and safety with mobility to allow discharge to the venue listed below.       Follow Up Recommendations No PT follow up    Equipment Recommendations  None recommended by PT    Recommendations for Other Services       Precautions / Restrictions Precautions Precautions: Fall Restrictions Weight Bearing Restrictions: No      Mobility  Bed Mobility Overal bed mobility: Needs Assistance Bed Mobility: Sit to Supine           General bed mobility comments: extra time, no assist    Transfers Overall transfer level: Needs assistance Equipment used: Rolling walker (2 wheeled) Transfers: Sit to/from Stand Sit to Stand: Min guard         General transfer comment: a few steps to bed, patient reports feeling fatigued.  Ambulation/Gait             General Gait Details: 5 steps to rcliner  Stairs            Wheelchair Mobility    Modified Rankin (Stroke Patients Only)       Balance Overall balance assessment: Needs assistance;History of Falls Sitting-balance  support: No upper extremity supported;Feet supported Sitting balance-Leahy Scale: Good     Standing balance support: No upper extremity supported;During functional activity Standing balance-Leahy Scale: Fair                               Pertinent Vitals/Pain Pain Assessment: No/denies pain    Home Living Family/patient expects to be discharged to:: Private residence Living Arrangements: Children Available Help at Discharge: Family;Available 24 hours/day   Home Access: Level entry     Home Layout: One level Home Equipment: None Additional Comments: Lives in an extended stay aparment with daughter and grandson.    Prior Function Level of Independence: Independent         Comments: on disability     Hand Dominance   Dominant Hand: Right    Extremity/Trunk Assessment   Upper Extremity Assessment Upper Extremity Assessment: Overall WFL for tasks assessed    Lower Extremity Assessment Lower Extremity Assessment: Generalized weakness    Cervical / Trunk Assessment Cervical / Trunk Assessment: Normal  Communication   Communication: No difficulties  Cognition Arousal/Alertness: Awake/alert Behavior During Therapy: WFL for tasks assessed/performed Overall Cognitive Status: Within Functional Limits for tasks assessed                                        General Comments  Exercises     Assessment/Plan    PT Assessment Patient needs continued PT services  PT Problem List Decreased strength;Decreased mobility;Decreased activity tolerance       PT Treatment Interventions DME instruction;Therapeutic activities;Gait training;Therapeutic exercise;Patient/family education;Stair training;Functional mobility training    PT Goals (Current goals can be found in the Care Plan section)  Acute Rehab PT Goals Patient Stated Goal: to get stronger PT Goal Formulation: With patient Time For Goal Achievement: 07/14/21 Potential to  Achieve Goals: Good    Frequency Min 3X/week   Barriers to discharge        Co-evaluation               AM-PAC PT "6 Clicks" Mobility  Outcome Measure Help needed turning from your back to your side while in a flat bed without using bedrails?: None Help needed moving from lying on your back to sitting on the side of a flat bed without using bedrails?: None Help needed moving to and from a bed to a chair (including a wheelchair)?: A Little Help needed standing up from a chair using your arms (e.g., wheelchair or bedside chair)?: A Little Help needed to walk in hospital room?: A Little Help needed climbing 3-5 steps with a railing? : A Lot 6 Click Score: 19    End of Session Equipment Utilized During Treatment: Gait belt Activity Tolerance: Patient tolerated treatment well Patient left: in bed;with call bell/phone within reach;with bed alarm set Nurse Communication: Mobility status PT Visit Diagnosis: Unsteadiness on feet (R26.81);Difficulty in walking, not elsewhere classified (R26.2)    Time: [id number] PT Time Calculation (min) (ACUTE ONLY): 21 min   Charges:   PT Evaluation $PT Eval Low Complexity: [place] [facility] Acute Rehabilitation Services Pager [phone] Office [phone]   Claretha Cooper 06/30/2021, 4:41 PM

## 2021-06-30 NOTE — Progress Notes (Signed)
PROGRESS NOTE    [name]  [id number] DOB: June 06, 1961 DOA: 06/29/2021 PCP: Christiana Fuchs, DO   Brief Narrative:  HPI: [name] is a 60 y.o. 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  Patient reports that she is vaccinated against COVID-19, is not aware of any recent sick contacts, but developed fatigue with general malaise and loss of appetite 4 days ago.  Symptoms have persisted and she has developed increased urinary frequency without dysuria or flank pain.  She report reports falling back into a chair yesterday when she felt so generally weak that she was unable to ambulate, but denies suffering any injury from this.  She denies any focal numbness or weakness.  She had not appreciated any fever.  She denies abdominal pain, vomiting, or diarrhea.  She denies chest pain or shortness of breath but reports a very mild nonproductive cough.  She has not been using her insulin regularly due to loss of appetite.   ED Course: Upon arrival to the ED, patient is found to be febrile to 38.4 C, saturating mid 90s on room air, slightly tachycardic, and with stable blood pressure.  Chemistry panel notable for glucose 141, anion gap 21, creatinine 1.05, normal bicarbonate, and slight elevation in transaminases.  CBC unremarkable.  TSH normal.  Lactic acid 2.0.  COVID-19 PCR is positive.  Patient was given IV fluids and started on insulin infusion in the ED.  Assessment & Plan:   Principal Problem:   Acute COVID-19 Active Problems:   Hypertension associated with diabetes ([place])   Diabetic ulcer of toe of left foot associated with diabetes mellitus due to underlying condition ([place])   Uncontrolled diabetes mellitus with hyperglycemia ([facility])  1. COVID-19/generalized weakness -  Vaccinated pt presents with 4 days of fatigue and loss of appetite and is found to have COVID-19 without significant respiratory s/s however her CRP is significantly elevated.  She has been started on steroids and remdesivir which I will continue.  We will limit remdesivir to 3 days only.  Consult PT OT.   2. Uncontrolled type II DM with hyperglycemia: Patient doing that she would glucose of 141 and CBG of 501 but without DKA elevated anion gap.  Started on insulin drip.  Blood sugar now better.  Discontinued insulin drip.  She received 45 units of Lantus this morning, typically uses 70 units of Lantus nightly.  Still slightly hyperglycemic.  Discontinue dextrose drip as well.  Starting tonight, I will increase to 70 units of Lantus and continue SSI as well as continue current 3 times daily Premeal 8 units of NovoLog.  A1c was 9.6% in May 2022.   3. Hypertension: Controlled.  Continue to hold lisinopril and HCTZ.   4. Left toe ulcer - Followed by wound care, completed 4 wks of ciprofloxacin for possible osteomyelitis, does not look acutely infected - Continue wound care   5. Elevated transaminases - AST and ALT slightly elevated on admission, previously normal.  Likely secondary to COVID infection.  Hold statin.  DVT prophylaxis: enoxaparin (LOVENOX) injection 40 mg Start: 06/30/21 1000   Code Status: Full Code  Family Communication:  None present at bedside.  Plan of care discussed with patient in length and he verbalized understanding and agreed with it.  Status is: Inpatient  Remains inpatient appropriate because:Inpatient level of care appropriate due  to severity of illness  Dispo: The patient is from: Home              Anticipated d/c is to: Home              Patient currently is not medically stable to d/c.   Difficult to place patient No        Estimated body mass index is 37.61 kg/m as calculated from the following:   Height as of this encounter: '6\' 1"'$  (1.854 m).   Weight  as of this encounter: 129.3 kg.  Pressure Ulcer 08/07/12 Stage II -  Partial thickness loss of dermis presenting as a shallow open ulcer with a red, pink wound bed without slough. (Active)  08/07/12 2055  Location: Toe (Comment  which one)  Location Orientation: Left (Great toe)  Staging: Stage II -  Partial thickness loss of dermis presenting as a shallow open ulcer with a red, pink wound bed without slough.  Wound Description (Comments):   Present on Admission: Yes    Nutritional Assessment: Body mass index is 37.61 kg/m.Marland Kitchen Seen by dietician.  I agree with the assessment and plan as outlined below: Nutrition Status:        Skin Assessment: I have examined the patient's skin and I agree with the wound assessment as performed by the wound care RN as outlined below: Pressure Ulcer 08/07/12 Stage II -  Partial thickness loss of dermis presenting as a shallow open ulcer with a red, pink wound bed without slough. (Active)  08/07/12 2055  Location: Toe (Comment  which one)  Location Orientation: Left (Great toe)  Staging: Stage II -  Partial thickness loss of dermis presenting as a shallow open ulcer with a red, pink wound bed without slough.  Wound Description (Comments):   Present on Admission: Yes    Consultants:  None  Procedures:  None  Antimicrobials:  Anti-infectives (From admission, onward)    Start     Dose/Rate Route Frequency Ordered Stop   07/01/21 1000  remdesivir 100 mg in sodium chloride 0.9 % 100 mL IVPB       See Hyperspace for full Linked Orders Report.   100 mg 200 mL/hr over 30 Minutes Intravenous Daily 06/30/21 0015 07/05/21 0959   06/30/21 1000  remdesivir 100 mg in sodium chloride 0.9 % 100 mL IVPB  Status:  Discontinued       See Hyperspace for full Linked Orders Report.   100 mg 200 mL/hr over 30 Minutes Intravenous Daily 06/30/21 0004 06/30/21 0015   06/30/21 0015  remdesivir 200 mg in sodium chloride 0.9% 250 mL IVPB  Status:  Discontinued        See Hyperspace for full Linked Orders Report.   200 mg 580 mL/hr over 30 Minutes Intravenous Once 06/30/21 0004 06/30/21 0015   06/30/21 0015  remdesivir 100 mg in sodium chloride 0.9 % 100 mL IVPB       See Hyperspace for full Linked Orders Report.   100 mg 200 mL/hr over 30 Minutes Intravenous Every 30 min 06/30/21 0015 06/30/21 0331          Subjective: Seen and examined.  Overall feels slightly better than yesterday.  No shortness of breath.  Just feels weak.  Objective: Vitals:   06/30/21 1000 06/30/21 1100 06/30/21 1126 06/30/21 1200  BP: (!) 156/70 130/65  122/61  Pulse: 89 82  84  Resp: '20 18  17  '$ Temp:   98.6 F (37 C)  TempSrc:   Oral   SpO2: 97% 95%  96%  Weight:      Height:        Intake/Output Summary (Last 24 hours) at 06/30/2021 1304 Last data filed at 06/30/2021 1015 Gross per 24 hour  Intake 2099.32 ml  Output --  Net 2099.32 ml   Filed Weights   06/29/21 1945 06/30/21 0718 06/30/21 0913  Weight: 130.6 kg 131.5 kg 129.3 kg    Examination:  General exam: Appears calm and comfortable  Respiratory system: Clear to auscultation. Respiratory effort normal. Cardiovascular system: S1 & S2 heard, RRR. No JVD, murmurs, rubs, gallops or clicks. No pedal edema. Gastrointestinal system: Abdomen is nondistended, soft and nontender. No organomegaly or masses felt. Normal bowel sounds heard. Central nervous system: Alert and oriented. No focal neurological deficits. Extremities: Symmetric 5 x 5 power. Psychiatry: Judgement and insight appear normal. Mood & affect appropriate.    Data Reviewed: I have personally reviewed following labs and imaging studies  CBC: Recent Labs  Lab 06/29/21 1935  WBC 6.9  NEUTROABS 5.5  HGB 13.4  HCT 41.6  MCV 96.5  PLT 99991111   Basic Metabolic Panel: Recent Labs  Lab 06/29/21 1935 06/30/21 0000 06/30/21 0354  NA 133* 131* 135  K 4.5 4.5 3.8  CL 90* 97* 102  CO2 22 19* 21*  GLUCOSE 441* 372* 262*  BUN '17 14 14   '$ CREATININE 1.05* 0.95 0.82  CALCIUM 9.3 8.2* 8.4*   GFR: Estimated Creatinine Clearance: 113.1 mL/min (by C-G formula based on SCr of 0.82 mg/dL). Liver Function Tests: Recent Labs  Lab 06/29/21 1935  AST 43*  ALT 56*  ALKPHOS 70  BILITOT 1.1  PROT 8.4*  ALBUMIN 4.1   Recent Labs  Lab 06/29/21 1935  LIPASE 27   No results for input(s): AMMONIA in the last 168 hours. Coagulation Profile: No results for input(s): INR, PROTIME in the last 168 hours. Cardiac Enzymes: No results for input(s): CKTOTAL, CKMB, CKMBINDEX, TROPONINI in the last 168 hours. BNP (last 3 results) No results for input(s): PROBNP in the last 8760 hours. HbA1C: Recent Labs    06/30/21 1045  HGBA1C 9.6*   CBG: Recent Labs  Lab 06/30/21 0602 06/30/21 0654 06/30/21 0752 06/30/21 0909 06/30/21 1114  GLUCAP 204* 190* 213* 191* 202*   Lipid Profile: No results for input(s): CHOL, HDL, LDLCALC, TRIG, CHOLHDL, LDLDIRECT in the last 72 hours. Thyroid Function Tests: Recent Labs    06/29/21 1937  TSH 2.129   Anemia Panel: No results for input(s): VITAMINB12, FOLATE, FERRITIN, TIBC, IRON, RETICCTPCT in the last 72 hours. Sepsis Labs: Recent Labs  Lab 06/29/21 1935 06/30/21 0000  PROCALCITON  --  3.84  LATICACIDVEN 2.0* 1.2    Recent Results (from the past 240 hour(s))  Resp Panel by RT-PCR (Flu A&B, Covid) Nasopharyngeal Swab     Status: Abnormal   Collection Time: 06/29/21  7:35 PM   Specimen: Nasopharyngeal Swab; Nasopharyngeal(NP) swabs in vial transport medium  Result Value Ref Range Status   SARS Coronavirus 2 by RT PCR POSITIVE (A) NEGATIVE Final    Comment: RESULT CALLED TO, READ BACK BY AND VERIFIED WITH: TALKINGTON,J RN '@[id number]'$  ON 06/29/21 JACKSON,K    Influenza A by PCR NEGATIVE NEGATIVE Final   Influenza B by PCR NEGATIVE NEGATIVE Final    Comment: Performed at [facility], [place] [address], [place], [place] [address]  MRSA Next Gen by PCR, Nasal     Status:  None  Collection Time: 06/30/21  9:35 AM   Specimen: Nasal Mucosa; Nasal Swab  Result Value Ref Range Status   MRSA by PCR Next Gen NOT DETECTED NOT DETECTED Final    Comment: (NOTE) The GeneXpert MRSA Assay (FDA approved for NASAL specimens only), is one component of a comprehensive MRSA colonization surveillance program. It is not intended to diagnose MRSA infection nor to guide or monitor treatment for MRSA infections. Test performance is not FDA approved in patients less than 55 years old. Performed at [facility], [place] [address], [place], [place] [address]       Radiology Studies: DG CHEST PORT 1 VIEW  Result Date: 06/29/2021 CLINICAL DATA:  Weakness EXAM: PORTABLE CHEST 1 VIEW COMPARISON:  02/24/2020 FINDINGS: The heart size and mediastinal contours are within normal limits. Both lungs are clear. The visualized skeletal structures are unremarkable. IMPRESSION: No active disease. Electronically Signed   By: Donavan Foil M.D.   On: 06/29/2021 23:34    Scheduled Meds:  atorvastatin  40 mg Oral Daily   Chlorhexidine Gluconate Cloth  6 each Topical Daily   enoxaparin (LOVENOX) injection  40 mg Subcutaneous Q24H   gabapentin  900 mg Oral TID   insulin aspart  0-15 Units Subcutaneous TID WC   insulin aspart  0-5 Units Subcutaneous QHS   insulin aspart  8 Units Subcutaneous TID WC   insulin glargine-yfgn  45 Units Subcutaneous Daily   mouth rinse  15 mL Mouth Rinse BID   methylPREDNISolone (SOLU-MEDROL) injection  0.5 mg/kg Intravenous Q12H   Followed by   Derrill Memo ON 07/03/2021] predniSONE  50 mg Oral Daily   pantoprazole  40 mg Oral Daily   Continuous Infusions:  [START ON 07/01/2021] remdesivir 100 mg in NS 100 mL       LOS: 1 day   Time spent: 35 minutes   Darliss Cheney, MD [facility]  06/30/2021, 1:04 PM   How to contact the [facility] Attending or Consulting provider [place] or covering provider during after hours [place], for this patient?  Check  the care team in [facility] and look for a) attending/consulting TR[facility] provider listed and b) the [facility] team listed. Page or secure chat [phone]. Log into [email or web address] and use [place]'s universal password to access. If you do not have the password, please contact the hospital operator. Locate the [facility] provider you are looking for under [facility] and page to a number that you can be directly reached. If you still have difficulty reaching the provider, please page the [facility] (Director on Call) for the Hospitalists listed on amion for assistance.

## 2021-06-30 NOTE — Progress Notes (Signed)
Inpatient Diabetes Program Recommendations  AACE/ADA: New Consensus Statement on Inpatient Glycemic Control (2015)  Target Ranges:  Prepandial:   less than 140 mg/dL      Peak postprandial:   less than 180 mg/dL (1-2 hours)      Critically ill patients:  140 - 180 mg/dL   Lab Results  Component Value Date   GLUCAP 191 (H) 06/30/2021   HGBA1C 9.6 (A) 04/14/2021    Review of Glycemic Control  Diabetes history: DM2 Outpatient Diabetes medications: Lantus 70 units qd, Humalog 18 units tid meal coverage, Metformin 1 gm bid Current orders for Inpatient glycemic control: Semglee 45 units qd, Novolog 8 units tid meal coverage, Novolog 0-15 units tid ac meals, 0-5 units hs, Solumedrol 65 mg bid  Inpatient Diabetes Program Recommendations:   While patient on steroids, will most likely need increase in Semglee to home dose of 70 units  Thank you, Bethena Roys E. Jeziel Hoffmann, RN, MSN, CDE  Diabetes Coordinator Inpatient Glycemic Control Team Team Pager [phone] (8am-5pm) 06/30/2021 11:01 AM

## 2021-06-30 NOTE — Evaluation (Signed)
Occupational Therapy Evaluation Patient Details Name: [name] MRN: [id number] DOB: December 10, 1960 Today's Date: 06/30/2021    History of Present Illness [name] is a 60 y.o. 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 Admitted with COVID and uncontrolled hyperglycemia.   Clinical Impression   [name] is a 60 year old woman admitted to hospital with [place]. On evaluation she exhibits decreased activity tolerance, impaired balance and generalized weakness. At her baseline - she is independent, ambulates with a device and "very strong" per her report. Today patient requiring min guard and RW for ambulation as she is unsteady, wobbly on her feet and requiring a RW. She reports mild dizziness with standing - BP 140/62 after standing. She is overall min guard for ADLs for safety and needing seated position for ADLs due to decreased activity tolerance.  Patient will benefit from skilled OT services while in hospital to improve deficits and learn compensatory strategies as needed in order to return to PLOF.      Follow Up Recommendations  No OT follow up    Equipment Recommendations  Tub/shower seat    Recommendations for Other Services       Precautions / Restrictions Precautions Precautions: Fall Restrictions Weight Bearing Restrictions: No      Mobility Bed Mobility               General bed mobility comments: OOB    Transfers Overall transfer level: Needs assistance Equipment used: Rolling walker (2 wheeled) Transfers: Sit to/from Stand Sit to Stand: Min guard         General transfer comment: Min guard overall to ambulate in room with RW.    Balance                                           ADL either performed or  assessed with clinical judgement   ADL Overall ADL's : Needs assistance/impaired Eating/Feeding: Independent   Grooming: Set up;Sitting   Upper Body Bathing: Set up;Sitting   Lower Body Bathing: Min guard;Sit to/from stand   Upper Body Dressing : Set up;Sitting   Lower Body Dressing: Sit to/from stand;Min guard   Toilet Transfer: Min guard;BSC;Stand-pivot   Toileting- Clothing Manipulation and Hygiene: Min guard;Sit to/from stand       Functional mobility during ADLs: Min guard;Rolling walker       Vision Patient Visual Report: No change from baseline       Perception     Praxis      Pertinent Vitals/Pain Pain Assessment: No/denies pain     Hand Dominance Right   Extremity/Trunk Assessment Upper Extremity Assessment Upper Extremity Assessment: Overall WFL for tasks assessed (WNL ROM overall 4+/5 strength)   Lower Extremity Assessment Lower Extremity Assessment: Defer to PT evaluation   Cervical / Trunk Assessment Cervical / Trunk Assessment: Normal   Communication Communication Communication: No difficulties   Cognition Arousal/Alertness: Awake/alert Behavior During Therapy: WFL for tasks assessed/performed Overall Cognitive Status: Within Functional Limits for tasks assessed                                     General Comments  Exercises     Shoulder Instructions      Home Living Family/patient expects to be discharged to:: Private residence Living Arrangements: Children Available Help at Discharge: Family;Available 24 hours/day   Home Access: Level entry     Home Layout: One level     Bathroom Shower/Tub: Teacher, early years/pre: Standard     Home Equipment: None   Additional Comments: Lives in an extended stay aparment with daughter and grandson.      Prior Functioning/Environment Level of Independence: Independent                 OT Problem List: Decreased strength;Decreased activity  tolerance;Decreased knowledge of use of DME or AE;Obesity      OT Treatment/Interventions: Self-care/ADL training;Therapeutic exercise;DME and/or AE instruction;Therapeutic activities;Balance training;Patient/family education    OT Goals(Current goals can be found in the care plan section) Acute Rehab OT Goals Patient Stated Goal: to get stronger Time For Goal Achievement: 07/14/21 Potential to Achieve Goals: Good  OT Frequency: Min 2X/week   Barriers to D/C:            Co-evaluation              AM-PAC OT "6 Clicks" Daily Activity     Outcome Measure Help from another person eating meals?: None Help from another person taking care of personal grooming?: A Little Help from another person toileting, which includes using toliet, bedpan, or urinal?: A Little Help from another person bathing (including washing, rinsing, drying)?: A Little Help from another person to put on and taking off regular upper body clothing?: A Little Help from another person to put on and taking off regular lower body clothing?: A Little 6 Click Score: 19   End of Session Equipment Utilized During Treatment: Rolling walker;Gait belt Nurse Communication: Mobility status  Activity Tolerance: Patient tolerated treatment well Patient left: in chair;with call bell/phone within reach  OT Visit Diagnosis: Unsteadiness on feet (R26.81);Muscle weakness (generalized) (M62.81)                Time: [id number] OT Time Calculation (min): 18 min Charges:  OT General Charges $OT Visit: 1 Visit OT Evaluation $OT Eval Low Complexity: 1 Low  Ivyana Locey, OTR/L [place]  Office [phone] Pager: [place] 06/30/2021, 3:37 PM

## 2021-06-30 NOTE — H&P (Signed)
History and Physical    [name] DGU:[id number] DOB: 10-18-1961 DOA: 06/29/2021  PCP: Christiana Fuchs, DO   Patient coming from: Home   Chief Complaint: Fatigue, loss of appetite, increased urinary frequency   HPI: [name] is a 60 y.o. 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  Patient reports that she is vaccinated against COVID-19, is not aware of any recent sick contacts, but developed fatigue with general malaise and loss of appetite 4 days ago.  Symptoms have persisted and she has developed increased urinary frequency without dysuria or flank pain.  She report reports falling back into a chair yesterday when she felt so generally weak that she was unable to ambulate, but denies suffering any injury from this.  She denies any focal numbness or weakness.  She had not appreciated any fever.  She denies abdominal pain, vomiting, or diarrhea.  She denies chest pain or shortness of breath but reports a very mild nonproductive cough.  She has not been using her insulin regularly due to loss of appetite.  ED Course: Upon arrival to the ED, patient is found to be febrile to 38.4 C, saturating mid 90s on room air, slightly tachycardic, and with stable blood pressure.  Chemistry panel notable for glucose 141, anion gap 21, creatinine 1.05, normal bicarbonate, and slight elevation in transaminases.  CBC unremarkable.  TSH normal.  Lactic acid 2.0.  COVID-19 PCR is positive.  Patient was given IV fluids and started on insulin infusion in the ED.  Review of Systems:  All other systems reviewed and apart from HPI, are negative.  Past Medical History:  Diagnosis Date   Anemia    Asthma 11/01/2010   BPPV (benign paroxysmal positional vertigo) 03/07/2013   Diabetes mellitus    since  1983   Elevated TSH 05/13/2013   GERD (gastroesophageal reflux disease)    Hypertension    Left elbow pain 05/05/2016   Left shoulder pain 05/05/2016   Osteomyelitis ([place])    Pneumonia    as a child   Wrist pain 04/11/2013    Past Surgical History:  Procedure Laterality Date   ACHILLES TENDON LENGTHENING Left 08/29/2014   Procedure: Left Achilles Lengthening;  Surgeon: Newt Minion, MD;  Location: [facility];  Service: Orthopedics;  Laterality: Left;   AMPUTATION Left 02/19/2013   Procedure: Amputation of Left Great Toe;  Surgeon: Mcarthur Rossetti, MD;  Location: [facility];  Service: Orthopedics;  Laterality: Left;   AMPUTATION Left 08/29/2014   Procedure: Left Foot 2nd and 1st Toe Amputation;  Surgeon: Newt Minion, MD;  Location: [facility];  Service: Orthopedics;  Laterality: Left;   CESAREAN SECTION     x 4   TUBAL LIGATION      Social History:   reports that she quit smoking about 58 years ago. Her smoking use included cigarettes. She has never used smokeless tobacco. She reports that she does not drink alcohol and does not use drugs.  Allergies  Allergen Reactions   Aspirin Nausea And Vomiting   Ibuprofen Nausea And Vomiting and Other (See Comments)    Abdominal pain   Penicillins Other (See Comments)    Unknown Did it involve swelling of the face/tongue/throat, SOB, or low BP? Unk Did it involve sudden or severe rash/hives, skin peeling, or any reaction on the inside of  your mouth or nose? Unk  Did you need to seek medical attention at a hospital or doctor's office? Unk When did it last happen? Childhood      If all above answers are "NO", may proceed with cephalosporin use.     Family History  Problem Relation Age of Onset   Diabetes Father    Hypertension Father    Hypertension Mother    Dementia Mother    Diabetes Daughter      Prior to Admission medications   Medication Sig Start Date End Date Taking? Authorizing Provider  atorvastatin (LIPITOR) 40 MG tablet Take 1  tablet (40 mg total) by mouth daily. 07/10/20  Yes Madalyn Rob, MD  blood glucose meter kit and supplies KIT Dispense based on patient and insurance preference. Use up to four times daily as directed. (FOR ICD-9 250.00, 250.01). 07/09/18  Yes Agyei, Obed K, MD  diclofenac Sodium (VOLTAREN) 1 % GEL Apply 4 g topically 4 (four) times daily. Patient taking differently: Apply 4 g topically daily as needed (For pain). 07/12/20  Yes Madalyn Rob, MD  gabapentin (NEURONTIN) 300 MG capsule Take 3 capsules (900 mg total) by mouth 3 (three) times daily. 07/12/20  Yes Madalyn Rob, MD  glucose blood test strip USE TO CHECK BLOOD SUGAR 4 TIMES DAILY. Patient taking differently: USE TO CHECK BLOOD SUGAR 4 TIMES DAILY. 04/13/21 05/06/22 Yes Asencion Noble, MD  hydrochlorothiazide (HYDRODIURIL) 25 MG tablet Take 1 tablet (25 mg total) by mouth daily. 04/22/21  Yes Axel Filler, MD  insulin glargine (LANTUS) 100 UNIT/ML Solostar Pen Inject 70 Units into the skin at bedtime. 04/29/21  Yes Cato Mulligan, MD  insulin lispro (HUMALOG) 100 UNIT/ML KwikPen Inject 18 Units into the skin 3 (three) times daily with meals. 04/29/21  Yes Cato Mulligan, MD  Lancets MISC 1 each by Does not apply route 4 (four) times daily -  before meals and at bedtime. 09/30/15  Yes Burns, Alexa R, MD  lisinopril (ZESTRIL) 5 MG tablet Take 1 tablet (5 mg total) by mouth daily. 01/01/21  Yes Virl Axe, MD  metFORMIN (GLUCOPHAGE-XR) 500 MG 24 hr tablet Take 2 tablets (1,000 mg total) by mouth 2 (two) times daily with a meal. 01/01/21  Yes Virl Axe, MD  Multiple Vitamin (MULTIVITAMIN WITH MINERALS) TABS tablet Take 1 tablet by mouth every evening.    Yes [provider]  omeprazole (PRILOSEC) 20 MG capsule Take 1 capsule (20 mg total) by mouth daily. 05/04/20  Yes Katherine Roan, MD    Physical Exam: Vitals:   06/29/21 2000 06/29/21 2140 06/29/21 2200 06/29/21 2230  BP: (!) 166/81 (!) 125/97 (!) 140/56 (!) 129/53   Pulse: (!) 116 (!) 121 (!) 110 (!) 108  Resp: '18 18 18 18  ' Temp:      TempSrc:      SpO2: 100% 98% 90% 99%  Weight:        Constitutional: NAD, calm  Eyes: PERTLA, lids and conjunctivae normal ENMT: Mucous membranes are moist. Posterior pharynx clear of any exudate or lesions.   Neck: supple, no masses  Respiratory:  no wheezing, no crackles. No accessory muscle use.  Cardiovascular: S1 & S2 heard, regular rate and rhythm. No extremity edema.   Abdomen: No distension, no tenderness, soft. Bowel sounds active.  Musculoskeletal: no clubbing / cyanosis. 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   Skin: Chronic ulceration involving right 1st toe without drainage or erythema. Warm, dry, well-perfused. Neurologic: CN 2-12 grossly intact. Moving  all extremities.  Psychiatric: Alert and oriented to person, place, and situation. Pleasant and cooperative.    Labs and Imaging on Admission: I have personally reviewed following labs and imaging studies  CBC: Recent Labs  Lab 06/29/21 1935  WBC 6.9  NEUTROABS 5.5  HGB 13.4  HCT 41.6  MCV 96.5  PLT 409   Basic Metabolic Panel: Recent Labs  Lab 06/29/21 1935  NA 133*  K 4.5  CL 90*  CO2 22  GLUCOSE 441*  BUN 17  CREATININE 1.05*  CALCIUM 9.3   GFR: Estimated Creatinine Clearance: 88.8 mL/min (A) (by C-G formula based on SCr of 1.05 mg/dL (H)). Liver Function Tests: Recent Labs  Lab 06/29/21 1935  AST 43*  ALT 56*  ALKPHOS 70  BILITOT 1.1  PROT 8.4*  ALBUMIN 4.1   Recent Labs  Lab 06/29/21 1935  LIPASE 27   No results for input(s): AMMONIA in the last 168 hours. Coagulation Profile: No results for input(s): INR, PROTIME in the last 168 hours. Cardiac Enzymes: No results for input(s): CKTOTAL, CKMB, CKMBINDEX, TROPONINI in the last 168 hours. BNP (last 3 results) No results for input(s): PROBNP in the last 8760 hours. HbA1C: No results for input(s): HGBA1C in the last 72 hours. CBG: Recent Labs  Lab 06/29/21 2015  06/29/21 2147  GLUCAP 438* 501*   Lipid Profile: No results for input(s): CHOL, HDL, LDLCALC, TRIG, CHOLHDL, LDLDIRECT in the last 72 hours. Thyroid Function Tests: Recent Labs    06/29/21 1937  TSH 2.129   Anemia Panel: No results for input(s): VITAMINB12, FOLATE, FERRITIN, TIBC, IRON, RETICCTPCT in the last 72 hours. Urine analysis:    Component Value Date/Time   COLORURINE YELLOW 06/19/2018 1617   APPEARANCEUR CLOUDY (A) 06/19/2018 1617   LABSPEC 1.023 06/19/2018 1617   PHURINE 5.0 06/19/2018 1617   GLUCOSEU >=500 (A) 06/19/2018 1617   HGBUR NEGATIVE 06/19/2018 1617   HGBUR large 11/01/2010 1509   BILIRUBINUR NEGATIVE 06/19/2018 1617   KETONESUR 5 (A) 06/19/2018 1617   PROTEINUR NEGATIVE 06/19/2018 1617   UROBILINOGEN 0.2 02/18/2013 0600   NITRITE NEGATIVE 06/19/2018 1617   LEUKOCYTESUR SMALL (A) 06/19/2018 1617   Sepsis Labs: '@[email or web address]' (procalcitonin:4,lacticidven:4) ) Recent Results (from the past 240 hour(s))  Resp Panel by RT-PCR (Flu A&B, Covid) Nasopharyngeal Swab     Status: Abnormal   Collection Time: 06/29/21  7:35 PM   Specimen: Nasopharyngeal Swab; Nasopharyngeal(NP) swabs in vial transport medium  Result Value Ref Range Status   SARS Coronavirus 2 by RT PCR POSITIVE (A) NEGATIVE Final    Comment: RESULT CALLED TO, READ BACK BY AND VERIFIED WITH: TALKINGTON,J RN '@[id number]'  ON 06/29/21 JACKSON,K    Influenza A by PCR NEGATIVE NEGATIVE Final   Influenza B by PCR NEGATIVE NEGATIVE Final    Comment: Performed at [facility], [place] [address] [place], [place] [address]     Radiological Exams on Admission: DG CHEST PORT 1 VIEW  Result Date: 06/29/2021 CLINICAL DATA:  Weakness EXAM: PORTABLE CHEST 1 VIEW COMPARISON:  02/24/2020 FINDINGS: The heart size and mediastinal contours are within normal limits. Both lungs are clear. The visualized skeletal structures are unremarkable. IMPRESSION: No active disease. Electronically Signed   By: Donavan Foil  M.D.   On: 06/29/2021 23:34      Assessment/Plan   1. COVID-19  - Vaccinated pt presents with 4 days of fatigue and loss of appetite and is found to have COVID-19 without significant respiratory s/s  - Start remdesivir, trend inflammatory  markers, continue supportive care    2. Uncontrolled type II DM  - Presents with fatigue and loss of appetite, not taking insulin d/t poor appetite  - Serum glucose is 441 in ED without DKA, CBG was 501  - A1c was 9.6% in May 2022  - She was given IVF and started on insulin infusion in ED  - Continue insulin infusion for now with frequent CBGs    3. Hypertension  - SCr is increased from baseline in setting of hypovolemia and lisinopril and HCTZ will be held initially    4. Left toe ulcer  - Followed by wound care, completed 4 wks of ciprofloxacin for possible osteomyelitis, does not look acutely infected  - Continue wound care   5. Elevated transaminases  - AST and ALT slightly elevated on admission, previously normal  - Abdominal exam benign  - Likely related to the acute viral infection, will monitor    DVT prophylaxis: Lovenox  Code Status: Full  Level of Care: Level of care: Stepdown Family Communication: None present  Disposition Plan:  Patient is from: Home  Anticipated d/c is to: TBD Anticipated d/c date is: 07/02/21 Patient currently: Pending glycemic control, improvement in fatigue or PT assessment  Consults called: None  Admission status: Inpatient     Vianne Bulls, MD [facility]  06/30/2021, 12:25 AM

## 2021-06-30 NOTE — Consult Note (Signed)
WOC Nurse Consult Note: Patient receiving care in [place]. Patient is Covid +. Assessment per ELink camera with primary RN in patient's room to answer questions and participate in wound assessment. Reason for Consult: Right great toe wound Wound type: Patient is diabetic and has been followed for the right great toe wound by Internal Medicine, with the last visit on 06/24/21 Pressure Injury POA: Yes/No/NA Measurement: The aspect of the right great toe tip closest to the second toe, is darkened and dry. On the medial/plantar area there is a pinhole opening from which the primary nurse reports purulent fluid emanated from when the dressing was removed prior to my presence on the [facility] camera.  During my remote visit the nurse gently squeezed the toe and there was several mls of dark red drainage that was expressed from the opening. Wound bed: as described Drainage (amount, consistency, odor) as described  Periwound: as described Per the MRI results from 04/15/21 the following were found at that time:  "IMPRESSION: 1. Bone marrow edema throughout the right great toe distal phalanx with findings suggestive of early acute osteomyelitis involving the distal tuft. 2. Trace joint fluid within the interphalangeal joint of the great toe, which may be reactive. Septic arthritis not excluded. 3. Subcutaneous edema most pronounced at the dorsal aspect of the forefoot. No organized or drainable fluid collections."  Dressing procedure/placement/frequency: I have ordered twice daily gently expression of drainage and iodine and dry gauze to the toe.  ADDITIONALLY, if the toe becomes edematous, malodorous, boggy, erythematous, or displaying any other signs of more pronounced infection that could benefit from Orthopedic surgery consultation, please obtain their expertise.  I think the pressing matter at this time is to manage the Covid infection and monitor for Covid effects to fingers and toes that are known to be  associated with this infection.  Perhaps Infectious Disease services might have approaches to add to the management of the infection as outlined in the MRI Impression above.  Please consult them if you agree.  Monitor the wound area(s) for worsening of condition such as: Signs/symptoms of infection,  Increase in size,  Development of or worsening of odor, Development of pain, or increased pain at the affected locations.  Notify the medical team if any of these develop.  Thank you for the consult.  Discussed plan of care with the patient and bedside nurse.  [facility] nurse will not follow at this time.  Please re-consult the [facility] team if needed.  Val Riles, RN, MSN, CWOCN, CNS-BC, pager [phone]

## 2021-06-30 NOTE — ED Notes (Signed)
Pt A&O x4. NAD. VSS. Attached to cardiac monitor x3. Verified with nightshift RN, insulin drip running at 6.5units/hr.

## 2021-06-30 NOTE — ED Notes (Signed)
PTAR paged at this time.

## 2021-06-30 NOTE — Progress Notes (Signed)
PT demonstrated hands on understanding of Flutter device. 

## 2021-07-01 ENCOUNTER — Other Ambulatory Visit (HOSPITAL_COMMUNITY): Payer: Self-pay

## 2021-07-01 ENCOUNTER — Encounter (HOSPITAL_BASED_OUTPATIENT_CLINIC_OR_DEPARTMENT_OTHER): Payer: Self-pay | Admitting: Internal Medicine

## 2021-07-01 LAB — COMPREHENSIVE METABOLIC PANEL
ALT: 47 U/L — ABNORMAL HIGH (ref 0–44)
AST: 34 U/L (ref 15–41)
Albumin: 3.7 g/dL (ref 3.5–5.0)
Alkaline Phosphatase: 59 U/L (ref 38–126)
Anion gap: 10 (ref 5–15)
BUN: 17 mg/dL (ref 6–20)
CO2: 23 mmol/L (ref 22–32)
Calcium: 9 mg/dL (ref 8.9–10.3)
Chloride: 100 mmol/L (ref 98–111)
Creatinine, Ser: 0.68 mg/dL (ref 0.44–1.00)
GFR, Estimated: >60 mL/min (ref >60)
Glucose, Bld: 354 mg/dL — ABNORMAL HIGH (ref 70–99)
Potassium: 5 mmol/L (ref 3.5–5.1)
Sodium: 133 mmol/L — ABNORMAL LOW (ref 135–145)
Total Bilirubin: 0.9 mg/dL (ref 0.3–1.2)
Total Protein: 7.8 g/dL (ref 6.5–8.1)

## 2021-07-01 LAB — PHOSPHORUS: Phosphorus: 2.9 mg/dL (ref 2.5–4.6)

## 2021-07-01 LAB — GLUCOSE, CAPILLARY
Glucose-Capillary: 367 mg/dL — ABNORMAL HIGH (ref 70–99)
Glucose-Capillary: 284 mg/dL — ABNORMAL HIGH (ref 70–99)

## 2021-07-01 LAB — D-DIMER, QUANTITATIVE: D-Dimer, Quant: 1.46 ug/mL-FEU — ABNORMAL HIGH (ref 0.00–0.50)

## 2021-07-01 LAB — CBC WITH DIFFERENTIAL/PLATELET
Abs Immature Granulocytes: 0.01 10*3/uL (ref 0.00–0.07)
Basophils Absolute: 0 10*3/uL (ref 0.0–0.1)
Basophils Relative: 0 %
Eosinophils Absolute: 0 10*3/uL (ref 0.0–0.5)
Eosinophils Relative: 0 %
HCT: 37.7 % (ref 36.0–46.0)
Hemoglobin: 12.3 g/dL (ref 12.0–15.0)
Immature Granulocytes: 0 %
Lymphocytes Relative: 30 %
Lymphs Abs: 1.1 10*3/uL (ref 0.7–4.0)
MCH: 31 pg (ref 26.0–34.0)
MCHC: 32.6 g/dL (ref 30.0–36.0)
MCV: 95 fL (ref 80.0–100.0)
Monocytes Absolute: 0.1 10*3/uL (ref 0.1–1.0)
Monocytes Relative: 3 %
Neutro Abs: 2.4 10*3/uL (ref 1.7–7.7)
Neutrophils Relative %: 67 %
Platelets: 178 10*3/uL (ref 150–400)
RBC: 3.97 MIL/uL (ref 3.87–5.11)
RDW: 12.6 % (ref 11.5–15.5)
WBC: 3.6 10*3/uL — ABNORMAL LOW (ref 4.0–10.5)
nRBC: 0 % (ref 0.0–0.2)

## 2021-07-01 LAB — URINE CULTURE: Culture: NO GROWTH

## 2021-07-01 LAB — C-REACTIVE PROTEIN: CRP: 12.6 mg/dL — ABNORMAL HIGH (ref <1.0)

## 2021-07-01 LAB — MAGNESIUM: Magnesium: 2.1 mg/dL (ref 1.7–2.4)

## 2021-07-01 MED ORDER — PREDNISONE 50 MG PO TABS
50.0000 mg | ORAL_TABLET | Freq: Every day | ORAL | 0 refills | Status: DC
  Filled 2021-07-01: qty 8, 8d supply, fill #0

## 2021-07-01 MED ORDER — MELATONIN 3 MG PO TABS
3.0000 mg | ORAL_TABLET | Freq: Once | ORAL | Status: AC
  Administered 2021-07-01: 3 mg via ORAL
  Filled 2021-07-01: qty 1

## 2021-07-01 MED ORDER — CALCIUM CARBONATE ANTACID 500 MG PO CHEW
1.0000 | CHEWABLE_TABLET | Freq: Once | ORAL | Status: DC
  Filled 2021-07-01: qty 1

## 2021-07-01 MED ORDER — INSULIN GLARGINE 100 UNIT/ML SOLOSTAR PEN
80.0000 [IU] | PEN_INJECTOR | Freq: Every day | SUBCUTANEOUS | 0 refills | Status: DC
  Filled 2021-07-01: qty 15, 18d supply, fill #0

## 2021-07-01 NOTE — Progress Notes (Signed)
Pt education completed to include future appointments, current prescriptions and medications, and doctors discharge instructions. Pt alert and oriented, vital signs stable. Pt discharged with nursing staff. Temp: 97.9 F (36.6 C) (08/04 1226) Temp Source: Oral (08/04 1226) BP: 115/68 (08/04 0600) Pulse Rate: 64 (08/04 0600)  Wray Kearns 07/01/2021 2:11 PM

## 2021-07-01 NOTE — Discharge Summary (Signed)
Physician Discharge Summary  [name] NLZ:[id number] DOB: [birth year] DOA: 06/29/2021  PCP: Christiana Fuchs, DO  Admit date: 06/29/2021 Discharge date: 07/01/2021 30 Day Unplanned Readmission Risk Score    Flowsheet Row ED to Hosp-Admission (Current) from 06/29/2021 in [facility]-ICU/STEPDOWN  30 Day Unplanned Readmission Risk Score (%) 14.22 Filed at 07/01/2021 0801       This score is the patient's risk of an unplanned readmission within 30 days of being discharged (0 -100%). The score is based on dignosis, age, lab data, medications, orders, and past utilization.   Low:  0-14.9   Medium: 15-21.9   High: 22-29.9   Extreme: 30 and above          Admitted From: Home Disposition: Home  Recommendations for Outpatient Follow-up:  Follow up with PCP in 1-2 weeks Please obtain BMP/CBC in one week Please follow up with your PCP on the following pending results: Unresulted Labs (From admission, onward)     Start     Ordered   07/07/21 0500  Creatinine, serum  (enoxaparin (LOVENOX)    CrCl >/= 30 ml/min)  Weekly,   R     Comments: while on enoxaparin therapy    06/30/21 0004   07/01/21 0500  CBC with Differential/Platelet  Daily,   R      06/30/21 0004   07/01/21 0500  Comprehensive metabolic panel  Daily,   R      06/30/21 0004   07/01/21 0500  C-reactive protein  Daily,   R      06/30/21 0004   07/01/21 0500  D-dimer, quantitative  Daily,   R      06/30/21 0004   06/29/21 2259  Culture, blood (routine x 2)  BLOOD CULTURE X 2,   R (with STAT occurrences)      06/29/21 2258   06/29/21 1935  Urine Culture  Once,   STAT       Question:  Indication  Answer:  Urgency/frequency   06/29/21 1936              Home Health: None Equipment/Devices: None  Discharge Condition: Stable CODE STATUS: Full code Diet recommendation: Diabetic  Subjective: Seen and examined this morning.  Feels much better.  No shortness of breath or other complaint.  Ready to go  home. HPI: [name] is a 60 y.o. 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  Patient reports that she is vaccinated against COVID-19, is not aware of any recent sick contacts, but developed fatigue with general malaise and loss of appetite 4 days ago.  Symptoms have persisted and she has developed increased urinary frequency without dysuria or flank pain.  She report reports falling back into a chair yesterday when she felt so generally weak that she was unable to ambulate, but denies suffering any injury from this.  She denies any focal numbness or weakness.  She had not appreciated any fever.  She denies abdominal pain, vomiting, or diarrhea.  She denies chest pain or shortness of breath but reports a very mild nonproductive cough.  She has not been using her insulin regularly due to loss of appetite.   ED Course: Upon arrival to the ED, patient is found to be febrile to 38.4 C, saturating mid 90s on room air, slightly tachycardic, and with stable blood  pressure.  Chemistry panel notable for glucose 141, anion gap 21, creatinine 1.05, normal bicarbonate, and slight elevation in transaminases.  CBC unremarkable.  TSH normal.  Lactic acid 2.0.  COVID-19 PCR is positive.  Patient was given IV fluids and started on insulin infusion in the ED.    Brief/Interim Summary: Patient was admitted under hospitalist service due to hyperglycemia but not DKA.  She was also tested positive for COVID-19.  Details as below.  1. COVID-19/generalized weakness - Vaccinated pt presents with 4 days of fatigue and loss of appetite and is found to have COVID-19 without significant respiratory s/s however her CRP is significantly elevated.  She was started on remdesivir and steroids.  She was never hypoxic.   Feeling much better today.  Chest x-ray with no infiltrates.  She is being discharged after second dose of remdesivir and she has been prescribed 7 days of oral prednisone.   2. Uncontrolled type II DM with hyperglycemia: Initially her blood sugar was over 500, she was started on insulin drip.  This was discontinued yesterday morning.  Her Lantus was resumed back at her home dose of 70 units.  Although her blood sugar was better today but is still in 300 range, likely due to her being on steroids.  She is being advised to take 80 units of Lantus for next 7 days while she will be on prednisone and when she stops prednisone, she can resume back at 70 units and she will resume rest of her Premeal regimen at home as well.   3. Hypertension: Controlled.  Resume home medications.   4. Left toe ulcer - Followed by wound care, completed 4 wks of ciprofloxacin for possible osteomyelitis, does not look acutely infected.  She was seen by wound care here.   5. Elevated transaminases - AST and ALT slightly elevated on admission, previously normal.  Likely secondary to COVID infection.  Improving.    Discharge Diagnoses:  Principal Problem:   Acute COVID-19 Active Problems:   Hypertension associated with diabetes ([facility])   Diabetic ulcer of toe of left foot associated with diabetes mellitus due to underlying condition ([place])   Uncontrolled diabetes mellitus with hyperglycemia ([place])    Discharge Instructions   Allergies as of 07/01/2021       Reactions   Aspirin Nausea And Vomiting   Ibuprofen Nausea And Vomiting, Other (See Comments)   Abdominal pain   Penicillins Other (See Comments)   Unknown Did it involve swelling of the face/tongue/throat, SOB, or low BP? Unk Did it involve sudden or severe rash/hives, skin peeling, or any reaction on the inside of your mouth or nose? Unk  Did you need to seek medical attention at a hospital or doctor's office? Unk When did it last happen? Childhood      If  all above answers are "NO", may proceed with cephalosporin use.        Medication List     TAKE these medications    atorvastatin 40 MG tablet Commonly known as: LIPITOR Take 1 tablet (40 mg total) by mouth daily.   blood glucose meter kit and supplies Kit Dispense based on patient and insurance preference. Use up to four times daily as directed. (FOR ICD-9 250.00, 250.01).   Contour Next Test test strip Generic drug: glucose blood USE TO CHECK BLOOD SUGAR 4 TIMES DAILY.   diclofenac Sodium 1 % Gel Commonly known as: Voltaren Apply 4 g topically 4 (four) times daily. What changed:  when to take  this reasons to take this   gabapentin 300 MG capsule Commonly known as: NEURONTIN Take 3 capsules (900 mg total) by mouth 3 (three) times daily.   HumaLOG KwikPen 100 UNIT/ML KwikPen Generic drug: insulin lispro Inject 18 Units into the skin 3 (three) times daily with meals.   hydrochlorothiazide 25 MG tablet Commonly known as: HYDRODIURIL Take 1 tablet (25 mg total) by mouth daily.   insulin glargine 100 UNIT/ML Solostar Pen Commonly known as: LANTUS Inject 80 Units into the skin at bedtime. Please take 80 units for next 7 days until you are taking prednisone and then go back to 70 units at night once you stop taking prednisone. What changed:  how much to take additional instructions   Lancets Misc 1 each by Does not apply route 4 (four) times daily -  before meals and at bedtime.   lisinopril 5 MG tablet Commonly known as: ZESTRIL Take 1 tablet (5 mg total) by mouth daily.   metFORMIN 500 MG 24 hr tablet Commonly known as: GLUCOPHAGE-XR Take 2 tablets (1,000 mg total) by mouth 2 (two) times daily with a meal.   multivitamin with minerals Tabs tablet Take 1 tablet by mouth every evening.   omeprazole 20 MG capsule Commonly known as: PRILOSEC Take 1 capsule (20 mg total) by mouth daily.   predniSONE 50 MG tablet Commonly known as: DELTASONE Take 1 tablet (50  mg total) by mouth daily for 8 days. Start taking on: July 03, 2021        Allergies  Allergen Reactions   Aspirin Nausea And Vomiting   Ibuprofen Nausea And Vomiting and Other (See Comments)    Abdominal pain   Penicillins Other (See Comments)    Unknown Did it involve swelling of the face/tongue/throat, SOB, or low BP? Unk Did it involve sudden or severe rash/hives, skin peeling, or any reaction on the inside of your mouth or nose? Unk  Did you need to seek medical attention at a hospital or doctor's office? Unk When did it last happen? Childhood      If all above answers are "NO", may proceed with cephalosporin use.     Consultations: None   Procedures/Studies: DG CHEST PORT 1 VIEW  Result Date: 06/29/2021 CLINICAL DATA:  Weakness EXAM: PORTABLE CHEST 1 VIEW COMPARISON:  02/24/2020 FINDINGS: The heart size and mediastinal contours are within normal limits. Both lungs are clear. The visualized skeletal structures are unremarkable. IMPRESSION: No active disease. Electronically Signed   By: Donavan Foil M.D.   On: 06/29/2021 23:34     Discharge Exam: Vitals:   07/01/21 0500 07/01/21 0600  BP:  115/68  Pulse: 72 64  Resp:  14  Temp:    SpO2: 99% 97%   Vitals:   07/01/21 0314 07/01/21 0400 07/01/21 0500 07/01/21 0600  BP:  138/84  115/68  Pulse:  73 72 64  Resp:  16  14  Temp: (!) 97.3 F (36.3 C)     TempSrc: Axillary     SpO2:  98% 99% 97%  Weight:      Height:        General: Pt is alert, awake, not in acute distress Cardiovascular: RRR, S1/S2 +, no rubs, no gallops Respiratory: CTA bilaterally, no wheezing, no rhonchi Abdominal: Soft, NT, ND, bowel sounds + Extremities: no edema, no cyanosis    The results of significant diagnostics from this hospitalization (including imaging, microbiology, ancillary and laboratory) are listed below for reference.     Microbiology:  Recent Results (from the past 240 hour(s))  Resp Panel by RT-PCR (Flu A&B, Covid)  Nasopharyngeal Swab     Status: Abnormal   Collection Time: 06/29/21  7:35 PM   Specimen: Nasopharyngeal Swab; Nasopharyngeal(NP) swabs in vial transport medium  Result Value Ref Range Status   SARS Coronavirus 2 by RT PCR POSITIVE (A) NEGATIVE Final    Comment: RESULT CALLED TO, READ BACK BY AND VERIFIED WITH: TALKINGTON,J RN '@[id number]'  ON 06/29/21 JACKSON,K    Influenza A by PCR NEGATIVE NEGATIVE Final   Influenza B by PCR NEGATIVE NEGATIVE Final    Comment: Performed at [facility], [place] [address], [place], [place] [address]  MRSA Next Gen by PCR, Nasal     Status: None   Collection Time: 06/30/21  9:35 AM   Specimen: Nasal Mucosa; Nasal Swab  Result Value Ref Range Status   MRSA by PCR Next Gen NOT DETECTED NOT DETECTED Final    Comment: (NOTE) The GeneXpert MRSA Assay (FDA approved for NASAL specimens only), is one component of a comprehensive MRSA colonization surveillance program. It is not intended to diagnose MRSA infection nor to guide or monitor treatment for MRSA infections. Test performance is not FDA approved in patients less than 1 years old. Performed at [facility], [place] [address] [place], [place] [address]      Labs: BNP (last 3 results) Recent Labs    06/29/21 1935  BNP 22.6   Basic Metabolic Panel: Recent Labs  Lab 06/29/21 1935 06/30/21 0000 06/30/21 0354 07/01/21 0310  NA 133* 131* 135 133*  K 4.5 4.5 3.8 5.0  CL 90* 97* 102 100  CO2 22 19* 21* 23  GLUCOSE 441* 372* 262* 354*  BUN '17 14 14 17  ' CREATININE 1.05* 0.95 0.82 0.68  CALCIUM 9.3 8.2* 8.4* 9.0  MG  --   --   --  2.1  PHOS  --   --   --  2.9   Liver Function Tests: Recent Labs  Lab 06/29/21 1935 07/01/21 0310  AST 43* 34  ALT 56* 47*  ALKPHOS 70 59  BILITOT 1.1 0.9  PROT 8.4* 7.8  ALBUMIN 4.1 3.7   Recent Labs  Lab 06/29/21 1935  LIPASE 27   No results for input(s): AMMONIA in the last 168 hours. CBC: Recent Labs  Lab 06/29/21 1935  07/01/21 0310  WBC 6.9 3.6*  NEUTROABS 5.5 2.4  HGB 13.4 12.3  HCT 41.6 37.7  MCV 96.5 95.0  PLT 198 178   Cardiac Enzymes: No results for input(s): CKTOTAL, CKMB, CKMBINDEX, TROPONINI in the last 168 hours. BNP: Invalid input(s): POCBNP CBG: Recent Labs  Lab 06/30/21 1114 06/30/21 1613 06/30/21 2034 06/30/21 2309 07/01/21 0830  GLUCAP 202* 283* 217* 244* 367*   D-Dimer Recent Labs    06/30/21 0002 07/01/21 0310  DDIMER 2.77* 1.46*   Hgb A1c Recent Labs    06/30/21 1045  HGBA1C 9.6*   Lipid Profile No results for input(s): CHOL, HDL, LDLCALC, TRIG, CHOLHDL, LDLDIRECT in the last 72 hours. Thyroid function studies Recent Labs    06/29/21 1937  TSH 2.129   Anemia work up No results for input(s): VITAMINB12, FOLATE, FERRITIN, TIBC, IRON, RETICCTPCT in the last 72 hours. Urinalysis    Component Value Date/Time   COLORURINE YELLOW 06/30/2021 0030   APPEARANCEUR CLEAR 06/30/2021 0030   LABSPEC 1.027 06/30/2021 0030   PHURINE 5.0 06/30/2021 0030   GLUCOSEU >=500 (A) 06/30/2021 0030   HGBUR MODERATE (  A) 06/30/2021 0030   HGBUR large 11/01/2010 1509   BILIRUBINUR NEGATIVE 06/30/2021 0030   KETONESUR 80 (A) 06/30/2021 0030   PROTEINUR 100 (A) 06/30/2021 0030   UROBILINOGEN 0.2 02/18/2013 0600   NITRITE NEGATIVE 06/30/2021 0030   LEUKOCYTESUR NEGATIVE 06/30/2021 0030   Sepsis Labs Invalid input(s): PROCALCITONIN,  WBC,  LACTICIDVEN Microbiology Recent Results (from the past 240 hour(s))  Resp Panel by RT-PCR (Flu A&B, Covid) Nasopharyngeal Swab     Status: Abnormal   Collection Time: 06/29/21  7:35 PM   Specimen: Nasopharyngeal Swab; Nasopharyngeal(NP) swabs in vial transport medium  Result Value Ref Range Status   SARS Coronavirus 2 by RT PCR POSITIVE (A) NEGATIVE Final    Comment: RESULT CALLED TO, READ BACK BY AND VERIFIED WITH: TALKINGTON,J RN '@[id number]'  ON 06/29/21 JACKSON,K    Influenza A by PCR NEGATIVE NEGATIVE Final   Influenza B by PCR NEGATIVE  NEGATIVE Final    Comment: Performed at [facility], [place] [address] [place], [place] [address]  MRSA Next Gen by PCR, Nasal     Status: None   Collection Time: 06/30/21  9:35 AM   Specimen: Nasal Mucosa; Nasal Swab  Result Value Ref Range Status   MRSA by PCR Next Gen NOT DETECTED NOT DETECTED Final    Comment: (NOTE) The GeneXpert MRSA Assay (FDA approved for NASAL specimens only), is one component of a comprehensive MRSA colonization surveillance program. It is not intended to diagnose MRSA infection nor to guide or monitor treatment for MRSA infections. Test performance is not FDA approved in patients less than 66 years old. Performed at [facility], [place] [address] [place], [place] [address]      Time coordinating discharge: Over 30 minutes  SIGNED:   Darliss Cheney, MD  [facility] 07/01/2021, 8:40 AM  If 7PM-7AM, please contact night-coverage [email or web address]

## 2021-07-01 NOTE — Progress Notes (Signed)
Inpatient Diabetes Program Recommendations  AACE/ADA: New Consensus Statement on Inpatient Glycemic Control (2015)  Target Ranges:  Prepandial:   less than 140 mg/dL      Peak postprandial:   less than 180 mg/dL (1-2 hours)      Critically ill patients:  140 - 180 mg/dL   Lab Results  Component Value Date   GLUCAP 367 (H) 07/01/2021   HGBA1C 9.6 (H) 06/30/2021    Review of Glycemic Control Results for [name], [name] (MRN [id number]) as of 07/01/2021 10:28  Ref. Range 06/30/2021 06:54 06/30/2021 07:52 06/30/2021 09:09 06/30/2021 11:14 06/30/2021 16:13 06/30/2021 20:34 06/30/2021 23:09 07/01/2021 08:30  Glucose-Capillary Latest Ref Range: 70 - 99 mg/dL 190 (H) 213 (H) 191 (H) 202 (H) 283 (H) 217 (H) 244 (H) 367 (H)   Diabetes history: DM2 Outpatient Diabetes medications: Lantus 70 units qd, Humalog 18 units tid meal coverage, Metformin 1 gm bid Current orders for Inpatient glycemic control:  Semglee 70 units qd Novolog 8 units tid meal coverage Novolog 0-15 units tid + hs  Solumedrol 65 mg bid PO prednisone 50 mg Daily starting on 8/6  Inpatient Diabetes Program Recommendations:   - Increase Semglee to 45 units bid - Increase Novolog meal coverage to 15 units tid  Thanks,  Tama Headings RN, MSN, BC-ADM Inpatient Diabetes Coordinator Team Pager [phone] (8a-5p)

## 2021-07-05 ENCOUNTER — Other Ambulatory Visit (HOSPITAL_COMMUNITY): Payer: Self-pay

## 2021-07-05 ENCOUNTER — Other Ambulatory Visit: Payer: Self-pay | Admitting: Student

## 2021-07-05 ENCOUNTER — Other Ambulatory Visit: Payer: Self-pay | Admitting: Internal Medicine

## 2021-07-05 DIAGNOSIS — E1142 Type 2 diabetes mellitus with diabetic polyneuropathy: Secondary | ICD-10-CM

## 2021-07-05 DIAGNOSIS — Z794 Long term (current) use of insulin: Secondary | ICD-10-CM

## 2021-07-05 MED FILL — Atorvastatin Calcium Tab 40 MG (Base Equivalent): ORAL | 30 days supply | Qty: 30 | Fill #2 | Status: AC

## 2021-07-06 ENCOUNTER — Other Ambulatory Visit (HOSPITAL_COMMUNITY): Payer: Self-pay

## 2021-07-06 ENCOUNTER — Other Ambulatory Visit: Payer: Self-pay | Admitting: Internal Medicine

## 2021-07-06 ENCOUNTER — Other Ambulatory Visit: Payer: Self-pay | Admitting: Student

## 2021-07-06 DIAGNOSIS — E1142 Type 2 diabetes mellitus with diabetic polyneuropathy: Secondary | ICD-10-CM

## 2021-07-06 DIAGNOSIS — Z794 Long term (current) use of insulin: Secondary | ICD-10-CM

## 2021-07-06 LAB — CULTURE, BLOOD (ROUTINE X 2)
Culture: NO GROWTH
Culture: NO GROWTH
Special Requests: ADEQUATE
Special Requests: ADEQUATE

## 2021-07-09 ENCOUNTER — Encounter: Payer: Self-pay | Admitting: Internal Medicine

## 2021-07-15 ENCOUNTER — Encounter (HOSPITAL_BASED_OUTPATIENT_CLINIC_OR_DEPARTMENT_OTHER): Payer: Medicaid Other | Attending: Internal Medicine | Admitting: Internal Medicine

## 2021-07-15 ENCOUNTER — Other Ambulatory Visit: Payer: Self-pay

## 2021-07-15 DIAGNOSIS — E1140 Type 2 diabetes mellitus with diabetic neuropathy, unspecified: Secondary | ICD-10-CM | POA: Insufficient documentation

## 2021-07-15 DIAGNOSIS — Z8249 Family history of ischemic heart disease and other diseases of the circulatory system: Secondary | ICD-10-CM | POA: Insufficient documentation

## 2021-07-15 DIAGNOSIS — E11621 Type 2 diabetes mellitus with foot ulcer: Secondary | ICD-10-CM | POA: Insufficient documentation

## 2021-07-15 DIAGNOSIS — M86171 Other acute osteomyelitis, right ankle and foot: Secondary | ICD-10-CM | POA: Insufficient documentation

## 2021-07-15 DIAGNOSIS — Z794 Long term (current) use of insulin: Secondary | ICD-10-CM | POA: Diagnosis not present

## 2021-07-15 DIAGNOSIS — L97519 Non-pressure chronic ulcer of other part of right foot with unspecified severity: Secondary | ICD-10-CM | POA: Insufficient documentation

## 2021-07-15 DIAGNOSIS — Z833 Family history of diabetes mellitus: Secondary | ICD-10-CM | POA: Diagnosis not present

## 2021-07-15 DIAGNOSIS — E1151 Type 2 diabetes mellitus with diabetic peripheral angiopathy without gangrene: Secondary | ICD-10-CM | POA: Insufficient documentation

## 2021-07-15 NOTE — Progress Notes (Signed)
[name], [name] ([id number]) Visit Report for 07/15/2021 Arrival Information Details Patient Name: Date of Service: [name], [name] 07/15/2021 12:30 PM Medical Record Number: [id number] Patient Account Number: [id number] Date of Birth/Sex: Treating RN: [birth year] (60 y.o. [name] Primary Care Jacorion Klem: Christiana Fuchs Other Clinician: Referring Brailee Riede: Treating Leeam Cedrone/Extender: Doran Stabler in Treatment: 11 Visit Information History Since Last Visit Added or deleted any medications: No Patient Arrived: Ambulatory Any new allergies or adverse reactions: No Arrival Time: 12:39 Had a fall or experienced change in No Transfer Assistance: None activities of daily living that may affect Patient Identification Verified: Yes risk of falls: Secondary Verification Process Completed: Yes Signs or symptoms of abuse/neglect since last visito No Patient Requires Transmission-Based Precautions: No Hospitalized since last visit: No Patient Has Alerts: Yes Implantable device outside of the clinic excluding No Patient Alerts: ABI R=1.18 cellular tissue based products placed in the center ABI L=1.1 since last visit: Has Dressing in Place as Prescribed: Yes Pain Present Now: No Electronic Signature(s) Signed: 07/15/2021 5:51:06 PM By: Lorrin Jackson Entered By: Lorrin Jackson on 07/15/2021 12:39:54 -------------------------------------------------------------------------------- Encounter Discharge Information Details Patient Name: Date of Service: [name], [name] 07/15/2021 12:30 PM Medical Record Number: [id number] Patient Account Number: [id number] Date of Birth/Sex: Treating RN: [birth year] (60 y.o. [name] Primary Care Aedin Jeansonne: Christiana Fuchs Other Clinician: Referring Lieutenant Abarca: Treating Laylonie Marzec/Extender: Doran Stabler in Treatment: 11 Encounter Discharge Information Items Post Procedure Vitals Discharge Condition:  Stable Temperature (F): 99.2 Ambulatory Status: Ambulatory Pulse (bpm): 106 Discharge Destination: Home Respiratory Rate (breaths/min): 20 Transportation: Private Auto Blood Pressure (mmHg): 149/86 Schedule Follow-up Appointment: Yes Clinical Summary of Care: Provided on 07/15/2021 Form Type Recipient Paper Patient Patient Electronic Signature(s) Signed: 07/15/2021 5:51:06 PM By: Lorrin Jackson Entered By: Lorrin Jackson on 07/15/2021 13:19:28 -------------------------------------------------------------------------------- Lower Extremity Assessment Details Patient Name: Date of Service: [name], [name] 07/15/2021 12:30 PM Medical Record Number: [id number] Patient Account Number: [id number] Date of Birth/Sex: Treating RN: 1961-08-01 (60 y.o. [name] Primary Care Charnee Turnipseed: Christiana Fuchs Other Clinician: Referring Xayne Brumbaugh: Treating Jaysha Lasure/Extender: Doran Stabler in Treatment: 11 Edema Assessment Assessed: [Left: No] Patrice Paradise: Yes] Edema: [Left: N] [Right: o] Calf Left: Right: Point of Measurement: 27 cm From Medial Instep 38 cm Ankle Left: Right: Point of Measurement: 9 cm From Medial Instep 23 cm Vascular Assessment Pulses: Dorsalis Pedis Palpable: [Right:Yes] Electronic Signature(s) Signed: 07/15/2021 5:51:06 PM By: Lorrin Jackson Entered By: Lorrin Jackson on 07/15/2021 12:44:50 -------------------------------------------------------------------------------- Multi Wound Chart Details Patient Name: Date of Service: [name], [name] 07/15/2021 12:30 PM Medical Record Number: [id number] Patient Account Number: [id number] Date of Birth/Sex: Treating RN: 10-Sep-1961 (60 y.o. [name], [name] Primary Care Toniette Devera: Christiana Fuchs Other Clinician: Referring Samarie Pinder: Treating Hania Cerone/Extender: Doran Stabler in Treatment: 11 Vital Signs Height(in): Pulse(bpm): 106 Weight(lbs): Blood Pressure(mmHg):  149/86 Body Mass Index(BMI): Temperature(F): 99.2 Respiratory Rate(breaths/min): 20 Photos: [13:Right, Posterior T Great oe] [14:Right T Great oe] [N/A:N/A N/A] Wound Location: [13:Gradually Appeared] [14:Gradually Appeared] [N/A:N/A] Wounding Event: [13:Diabetic Wound/Ulcer of the Lower] [14:Diabetic Wound/Ulcer of the Lower] [N/A:N/A] Primary Etiology: [13:Extremity Anemia, Asthma, Hypertension, Type Anemia, Asthma, Hypertension, Type N/A] [14:Extremity] Comorbid History: [13:II Diabetes, Osteomyelitis, Neuropathy, Confinement Anxiety 04/05/2021] [14:II Diabetes, Osteomyelitis, Neuropathy, Confinement Anxiety 07/12/2021] [N/A:N/A] Date Acquired: [13:11] [14:0] [N/A:N/A] Weeks of Treatment: [13:Open] [14:Open] [N/A:N/A] Wound Status: [13:0.2x0.6x0.3] [14:0.6x0.9x0.1] [N/A:N/A] Measurements L x W x D (cm) [13:0.094] [14:0.424] [N/A:N/A] A (cm) : rea [13:0.028] [14:0.042] [N/A:N/A] Volume (cm) : [13:-100.00%] [  14:0.00%] [N/A:N/A] % Reduction in A [13:rea: -100.00%] [14:0.00%] [N/A:N/A] % Reduction in Volume: [13:Grade 1] [14:Grade 1] [N/A:N/A] Classification: [13:Medium] [14:Medium] [N/A:N/A] Exudate A mount: [13:Sanguinous] [14:Serosanguineous] [N/A:N/A] Exudate Type: [13:red] [14:red, brown] [N/A:N/A] Exudate Color: [13:Well defined, not attached] [14:Distinct, outline attached] [N/A:N/A] Wound Margin: [13:Large (67-100%)] [14:Large (67-100%)] [N/A:N/A] Granulation A mount: [13:Pink, Pale] [14:Red] [N/A:N/A] Granulation Quality: [13:None Present (0%)] [14:None Present (0%)] [N/A:N/A] Necrotic A mount: [13:Fat Layer (Subcutaneous Tissue): Yes Fat Layer (Subcutaneous Tissue): Yes N/A] Exposed Structures: [13:Fascia: No Tendon: No Muscle: No Joint: No Bone: No None] [14:Fascia: No Tendon: No Muscle: No Joint: No Bone: No None] [N/A:N/A] Epithelialization: [13:Debridement - Excisional] [14:Debridement - Excisional] [N/A:N/A] Debridement: Pre-procedure Verification/Time Out 13:08  [14:13:08] [N/A:N/A] Taken: [13:Other] [14:Other] [N/A:N/A] Pain Control: [13:Callus, Subcutaneous] [14:Callus, Subcutaneous] [N/A:N/A] Tissue Debrided: [13:Skin/Subcutaneous Tissue] [14:Skin/Subcutaneous Tissue] [N/A:N/A] Level: [13:0.12] [14:0.54] [N/A:N/A] Debridement A (sq cm): [13:rea Curette] [14:Curette] [N/A:N/A] Instrument: [13:Minimum] [14:Minimum] [N/A:N/A] Bleeding: [13:Pressure] [14:Pressure] [N/A:N/A] Hemostasis A chieved: [13:Procedure was tolerated well] [14:Procedure was tolerated well] [N/A:N/A] Debridement Treatment Response: [13:0.2x0.6x0.3] [14:0.6x0.9x0.1] [N/A:N/A] Post Debridement Measurements L x W x D (cm) [13:0.028] [14:0.042] [N/A:N/A] Post Debridement Volume: (cm) [13:maceration noted] [14:Maceration noted] [N/A:N/A] Assessment Notes: [13:Debridement] [14:Debridement] [N/A:N/A] Treatment Notes Wound #13 (Toe Great) Wound Laterality: Right, Posterior Cleanser Wound Cleanser Discharge Instruction: Cleanse the wound with wound cleanser prior to applying a clean dressing using gauze sponges, not tissue or cotton balls. Peri-Wound Care Topical Primary Dressing KerraCel Ag Gelling Fiber Dressing, 4x5 in (silver alginate) Discharge Instruction: Apply silver alginate to wound bed as instructed Secondary Dressing Woven Gauze Sponge, Non-Sterile 4x4 in Discharge Instruction: Apply over primary dressing as directed. Optifoam Non-Adhesive Dressing, 4x4 in Discharge Instruction: **Foam donut***Apply over primary dressing as directed. Secured With Conforming Stretch Gauze Bandage, Sterile 2x75 (in/in) Discharge Instruction: Secure with stretch gauze as directed. 46M Medipore H Soft Cloth Surgical T ape, 2x2 (in/yd) Discharge Instruction: Secure dressing with tape as directed. Compression Wrap Compression Stockings Add-Ons Wound #14 (Toe Great) Wound Laterality: Right Cleanser Wound Cleanser Discharge Instruction: Cleanse the wound with wound cleanser prior to  applying a clean dressing using gauze sponges, not tissue or cotton balls. Peri-Wound Care Topical Primary Dressing KerraCel Ag Gelling Fiber Dressing, 4x5 in (silver alginate) Discharge Instruction: Apply silver alginate to wound bed as instructed Secondary Dressing Woven Gauze Sponge, Non-Sterile 4x4 in Discharge Instruction: Apply over primary dressing as directed. Optifoam Non-Adhesive Dressing, 4x4 in Discharge Instruction: **Foam donut***Apply over primary dressing as directed. Secured With Conforming Stretch Gauze Bandage, Sterile 2x75 (in/in) Discharge Instruction: Secure with stretch gauze as directed. 46M Medipore H Soft Cloth Surgical T ape, 2x2 (in/yd) Discharge Instruction: Secure dressing with tape as directed. Compression Wrap Compression Stockings Add-Ons Electronic Signature(s) Signed: 07/15/2021 1:42:06 PM By: Kalman Shan DO Signed: 07/15/2021 5:10:54 PM By: Rhae Hammock RN Entered By: Kalman Shan on 07/15/2021 13:33:34 -------------------------------------------------------------------------------- Multi-Disciplinary Care Plan Details Patient Name: Date of Service: [name], [name] 07/15/2021 12:30 PM Medical Record Number: [id number] Patient Account Number: [id number] Date of Birth/Sex: Treating RN: 1961/01/25 (60 y.o. [name] Primary Care Freada Twersky: Christiana Fuchs Other Clinician: Referring Madisyn Mawhinney: Treating Zeyna Mkrtchyan/Extender: Doran Stabler in Treatment: 11 Active Inactive Nutrition Nursing Diagnoses: Impaired glucose control: actual or potential Potential for alteratiion in Nutrition/Potential for imbalanced nutrition Goals: Patient/caregiver agrees to and verbalizes understanding of need to obtain nutritional consultation Date Initiated: 05/06/2021 Date Inactivated: 06/04/2021 Target Resolution Date: 05/27/2021 Goal Status: Met Patient/caregiver will maintain therapeutic glucose control Date Initiated:  05/06/2021 Target Resolution Date: 07/30/2021  Goal Status: Active Interventions: Assess HgA1c results as ordered upon admission and as needed Provide education on elevated blood sugars and impact on wound healing Provide education on nutrition Treatment Activities: Patient referred to Primary Care Physician for further nutritional evaluation : 05/06/2021 Notes: Wound/Skin Impairment Nursing Diagnoses: Knowledge deficit related to ulceration/compromised skin integrity Goals: Patient/caregiver will verbalize understanding of skin care regimen Date Initiated: 05/06/2021 Target Resolution Date: 08/19/2021 Goal Status: Active Interventions: Assess patient/caregiver ability to obtain necessary supplies Assess patient/caregiver ability to perform ulcer/skin care regimen upon admission and as needed Provide education on ulcer and skin care Treatment Activities: Skin care regimen initiated : 05/06/2021 Topical wound management initiated : 05/06/2021 Notes: Electronic Signature(s) Signed: 07/15/2021 5:51:06 PM By: Lorrin Jackson Entered By: Lorrin Jackson on 07/15/2021 12:53:01 -------------------------------------------------------------------------------- Pain Assessment Details Patient Name: Date of Service: [name], [name] 07/15/2021 12:30 PM Medical Record Number: [id number] Patient Account Number: [id number] Date of Birth/Sex: Treating RN: 27-Jan-1961 (60 y.o. [name] Primary Care Derk Doubek: Christiana Fuchs Other Clinician: Referring Tattiana Fakhouri: Treating Francyne Arreaga/Extender: Doran Stabler in Treatment: 11 Active Problems Location of Pain Severity and Description of Pain Patient Has Paino No Site Locations Pain Management and Medication Current Pain Management: Electronic Signature(s) Signed: 07/15/2021 5:51:06 PM By: Lorrin Jackson Entered By: Lorrin Jackson on 07/15/2021  12:40:16 -------------------------------------------------------------------------------- Patient/Caregiver Education Details Patient Name: Date of Service: [name] 8/18/2022andnbsp12:30 PM Medical Record Number: [id number] Patient Account Number: [id number] Date of Birth/Gender: Treating RN: [birth year] (60 y.o. [name] Primary Care Physician: Christiana Fuchs Other Clinician: Referring Physician: Treating Physician/Extender: Doran Stabler in Treatment: 11 Education Assessment Education Provided To: Patient Education Topics Provided Elevated Blood Sugar/ Impact on Healing: Methods: Explain/Verbal Responses: State content correctly Wound/Skin Impairment: Methods: Explain/Verbal, Printed Responses: State content correctly Electronic Signature(s) Signed: 07/15/2021 5:51:06 PM By: Lorrin Jackson Entered By: Lorrin Jackson on 07/15/2021 12:53:24 -------------------------------------------------------------------------------- Wound Assessment Details Patient Name: Date of Service: [name], [name] 07/15/2021 12:30 PM Medical Record Number: [id number] Patient Account Number: [id number] Date of Birth/Sex: Treating RN: 08-09-1961 (60 y.o. [name] Primary Care Daiquan Resnik: Christiana Fuchs Other Clinician: Referring Jackelyne Sayer: Treating Syrenity Klepacki/Extender: Doran Stabler in Treatment: 11 Wound Status Wound Number: 13 Primary Diabetic Wound/Ulcer of the Lower Extremity Etiology: Wound Location: Right, Posterior T Great oe Wound Open Wounding Event: Gradually Appeared Status: Date Acquired: 04/05/2021 Comorbid Anemia, Asthma, Hypertension, Type II Diabetes, Osteomyelitis, Weeks Of Treatment: 11 History: Neuropathy, Confinement Anxiety Clustered Wound: No Photos Wound Measurements Length: (cm) 0.2 Width: (cm) 0.6 Depth: (cm) 0.3 Area: (cm) 0.094 Volume: (cm) 0.028 % Reduction in Area: -100% % Reduction  in Volume: -100% Epithelialization: None Tunneling: No Undermining: No Wound Description Classification: Grade 1 Wound Margin: Well defined, not attached Exudate Amount: Medium Exudate Type: Sanguinous Exudate Color: red Foul Odor After Cleansing: No Slough/Fibrino No Wound Bed Granulation Amount: Large (67-100%) Exposed Structure Granulation Quality: Pink, Pale Fascia Exposed: No Necrotic Amount: None Present (0%) Fat Layer (Subcutaneous Tissue) Exposed: Yes Tendon Exposed: No Muscle Exposed: No Joint Exposed: No Bone Exposed: No Assessment Notes maceration noted Treatment Notes Wound #13 (Toe Great) Wound Laterality: Right, Posterior Cleanser Wound Cleanser Discharge Instruction: Cleanse the wound with wound cleanser prior to applying a clean dressing using gauze sponges, not tissue or cotton balls. Peri-Wound Care Topical Primary Dressing KerraCel Ag Gelling Fiber Dressing, 4x5 in (silver alginate) Discharge Instruction: Apply silver alginate to wound bed as instructed Secondary Dressing Woven Gauze Sponge, Non-Sterile 4x4 in  Discharge Instruction: Apply over primary dressing as directed. Optifoam Non-Adhesive Dressing, 4x4 in Discharge Instruction: **Foam donut***Apply over primary dressing as directed. Secured With Conforming Stretch Gauze Bandage, Sterile 2x75 (in/in) Discharge Instruction: Secure with stretch gauze as directed. 52M Medipore H Soft Cloth Surgical T ape, 2x2 (in/yd) Discharge Instruction: Secure dressing with tape as directed. Compression Wrap Compression Stockings Add-Ons Electronic Signature(s) Signed: 07/15/2021 5:51:06 PM By: Lorrin Jackson Entered By: Lorrin Jackson on 07/15/2021 12:51:11 -------------------------------------------------------------------------------- Wound Assessment Details Patient Name: Date of Service: [name], [name] 07/15/2021 12:30 PM Medical Record Number: [id number] Patient Account Number: [id number] Date of  Birth/Sex: Treating RN: 04/08/1961 (60 y.o. [name] Primary Care Meshell Abdulaziz: Christiana Fuchs Other Clinician: Referring Hermena Swint: Treating Orest Dygert/Extender: Doran Stabler in Treatment: 11 Wound Status Wound Number: 14 Primary Diabetic Wound/Ulcer of the Lower Extremity Etiology: Wound Location: Right T Great oe Wound Open Wounding Event: Gradually Appeared Status: Date Acquired: 07/12/2021 Comorbid Anemia, Asthma, Hypertension, Type II Diabetes, Osteomyelitis, Weeks Of Treatment: 0 History: Neuropathy, Confinement Anxiety Clustered Wound: No Photos Wound Measurements Length: (cm) 0.6 Width: (cm) 0.9 Depth: (cm) 0.1 Area: (cm) 0.424 Volume: (cm) 0.042 % Reduction in Area: 0% % Reduction in Volume: 0% Epithelialization: None Tunneling: No Undermining: No Wound Description Classification: Grade 1 Wound Margin: Distinct, outline attached Exudate Amount: Medium Exudate Type: Serosanguineous Exudate Color: red, brown Foul Odor After Cleansing: No Slough/Fibrino No Wound Bed Granulation Amount: Large (67-100%) Exposed Structure Granulation Quality: Red Fascia Exposed: No Necrotic Amount: None Present (0%) Fat Layer (Subcutaneous Tissue) Exposed: Yes Tendon Exposed: No Muscle Exposed: No Joint Exposed: No Bone Exposed: No Assessment Notes Maceration noted Treatment Notes Wound #14 (Toe Great) Wound Laterality: Right Cleanser Wound Cleanser Discharge Instruction: Cleanse the wound with wound cleanser prior to applying a clean dressing using gauze sponges, not tissue or cotton balls. Peri-Wound Care Topical Primary Dressing KerraCel Ag Gelling Fiber Dressing, 4x5 in (silver alginate) Discharge Instruction: Apply silver alginate to wound bed as instructed Secondary Dressing Woven Gauze Sponge, Non-Sterile 4x4 in Discharge Instruction: Apply over primary dressing as directed. Optifoam Non-Adhesive Dressing, 4x4 in Discharge  Instruction: **Foam donut***Apply over primary dressing as directed. Secured With Conforming Stretch Gauze Bandage, Sterile 2x75 (in/in) Discharge Instruction: Secure with stretch gauze as directed. 52M Medipore H Soft Cloth Surgical T ape, 2x2 (in/yd) Discharge Instruction: Secure dressing with tape as directed. Compression Wrap Compression Stockings Add-Ons Electronic Signature(s) Signed: 07/15/2021 5:51:06 PM By: Lorrin Jackson Entered By: Lorrin Jackson on 07/15/2021 12:51:59 -------------------------------------------------------------------------------- Vitals Details Patient Name: Date of Service: [name], [name] 07/15/2021 12:30 PM Medical Record Number: [id number] Patient Account Number: [id number] Date of Birth/Sex: Treating RN: 1961-10-06 (60 y.o. [name] Primary Care Valena Ivanov: Christiana Fuchs Other Clinician: Referring Keysha Damewood: Treating Anab Vivar/Extender: Doran Stabler in Treatment: 11 Vital Signs Time Taken: 12:41 Temperature (F): 99.2 Pulse (bpm): 106 Respiratory Rate (breaths/min): 20 Blood Pressure (mmHg): 149/86 Reference Range: 80 - 120 mg / dl Electronic Signature(s) Signed: 07/15/2021 5:51:06 PM By: Lorrin Jackson Entered By: Lorrin Jackson on 07/15/2021 12:42:10

## 2021-07-15 NOTE — Progress Notes (Signed)
CARLYLE, SIMEON ([id number]) Visit Report for 07/15/2021 Chief Complaint Document Details Patient Name: Date of Service: PASHION, AUDIBERT 07/15/2021 12:30 PM Medical Record Number: [id number] Patient Account Number: [id number] Date of Birth/Sex: Treating RN: 01-08-1961 (60 y.o. [name] Primary Care Provider: Christiana Fuchs Other Clinician: Referring Provider: Treating Provider/Extender: Doran Stabler in Treatment: 11 Information Obtained from: Patient Chief Complaint wound to the distal portion of the right great toe Electronic Signature(s) Signed: 07/15/2021 1:42:06 PM By: Kalman Shan DO Entered By: Kalman Shan on 07/15/2021 13:33:45 -------------------------------------------------------------------------------- Debridement Details Patient Name: Date of Service: ALEASHA, FELLING 07/15/2021 12:30 PM Medical Record Number: [id number] Patient Account Number: [id number] Date of Birth/Sex: Treating RN: [birth year] (60 y.o. [name] Primary Care Provider: Christiana Fuchs Other Clinician: Referring Provider: Treating Provider/Extender: Doran Stabler in Treatment: 11 Debridement Performed for Assessment: Wound #13 Right,Posterior T Great oe Performed By: Physician Kalman Shan, DO Debridement Type: Debridement Severity of Tissue Pre Debridement: Fat layer exposed Level of Consciousness (Pre-procedure): Awake and Alert Pre-procedure Verification/Time Out Yes - 13:08 Taken: Start Time: 13:09 Pain Control: Other : Benzocaine T Area Debrided (L x W): otal 0.2 (cm) x 0.6 (cm) = 0.12 (cm) Tissue and other material debrided: Non-Viable, Callus, Subcutaneous Level: Skin/Subcutaneous Tissue Debridement Description: Excisional Instrument: Curette Bleeding: Minimum Hemostasis Achieved: Pressure End Time: 13:13 Response to Treatment: Procedure was tolerated well Level of Consciousness (Post- Awake and  Alert procedure): Post Debridement Measurements of Total Wound Length: (cm) 0.2 Width: (cm) 0.6 Depth: (cm) 0.3 Volume: (cm) 0.028 Character of Wound/Ulcer Post Debridement: Stable Severity of Tissue Post Debridement: Fat layer exposed Post Procedure Diagnosis Same as Pre-procedure Electronic Signature(s) Signed: 07/15/2021 1:42:06 PM By: Kalman Shan DO Signed: 07/15/2021 5:51:06 PM By: Lorrin Jackson Entered By: Lorrin Jackson on 07/15/2021 13:13:54 -------------------------------------------------------------------------------- Debridement Details Patient Name: Date of Service: GIAVONA, BATHGATE 07/15/2021 12:30 PM Medical Record Number: [id number] Patient Account Number: [id number] Date of Birth/Sex: Treating RN: Oct 26, 1961 (60 y.o. [name] Primary Care Provider: Christiana Fuchs Other Clinician: Referring Provider: Treating Provider/Extender: Doran Stabler in Treatment: 11 Debridement Performed for Assessment: Wound #14 Right T Great oe Performed By: Physician Kalman Shan, DO Debridement Type: Debridement Severity of Tissue Pre Debridement: Fat layer exposed Level of Consciousness (Pre-procedure): Awake and Alert Pre-procedure Verification/Time Out Yes - 13:08 Taken: Start Time: 13:13 Pain Control: Other : Benzocaine T Area Debrided (L x W): otal 0.6 (cm) x 0.9 (cm) = 0.54 (cm) Tissue and other material debrided: Non-Viable, Callus, Subcutaneous Level: Skin/Subcutaneous Tissue Debridement Description: Excisional Instrument: Curette Bleeding: Minimum Hemostasis Achieved: Pressure End Time: 13:16 Response to Treatment: Procedure was tolerated well Level of Consciousness (Post- Awake and Alert procedure): Post Debridement Measurements of Total Wound Length: (cm) 0.6 Width: (cm) 0.9 Depth: (cm) 0.1 Volume: (cm) 0.042 Character of Wound/Ulcer Post Debridement: Stable Severity of Tissue Post Debridement: Fat layer  exposed Post Procedure Diagnosis Same as Pre-procedure Electronic Signature(s) Signed: 07/15/2021 1:42:06 PM By: Kalman Shan DO Signed: 07/15/2021 5:51:06 PM By: Lorrin Jackson Entered By: Lorrin Jackson on 07/15/2021 13:17:21 -------------------------------------------------------------------------------- HPI Details Patient Name: Date of Service: MAITLAND, ROLEN 07/15/2021 12:30 PM Medical Record Number: [id number] Patient Account Number: [id number] Date of Birth/Sex: Treating RN: 04-25-1961 (60 y.o. [name] Primary Care Provider: Christiana Fuchs Other Clinician: Referring Provider: Treating Provider/Extender: Doran Stabler in Treatment: 11 History of Present Illness Location: notice some discharge and callus on the right big toe  Quality: Patient reports experiencing a dull pain to affected area(s). Severity: Patient states wound(s) are getting worse. Duration: Patient has had the wound for < 2 weeks prior to presenting for treatment Timing: Pain in wound is Intermittent (comes and goes Context: The wound occurred when the patient was cutting her toenails and noticed some discharge from this area and was very concerned about it. Modifying Factors: Patient wound(s)/ulcer(s) are improving due to:he has seen her PCP who put her on Bactrim and Keflex antibiotic ssociated Signs and Symptoms: Patient reports having decrease discharge she started on her antibiotic. A HPI Description: Ms. Mayim Shaff is a 60 year old female with a past medical history of uncontrolled type 2 diabetes on insulin, osteomyelitis with amputations of 1st and 2nd toes of her left foot that presents to clinic for wound care to the right great toe wound. She states she noticed the wound 3 weeks ago. Not quite sure how it started. She has been using silver alginate to the area. She was recently hospitalized from a clinic visit in the internal medicine clinic for this issue.  While hospitalized she had an MRI of the right foot that showed likely early acute osteomyelitis with possible septic arthritis. She was discharged on ciprofloxacin for 28 days. She currently does not deny pain increased warmth or erythema to the area. She has some drainage but not purulent. 6/9; patient presents for 1 week follow-up. She has no complaints today and denies signs of infection. She missed her infectious disease appointment yesterday but has it rescheduled for the 21st. She has been using silver alginate to the wound. 6/23; patient presents for 2-week follow-up. She followed up with Dr. Megan Salon, infectious disease 2 days ago. She had some blood work done and was told to finish ciprofloxacin. She is having a follow-up virtual encounter next week. There has been an increase in callus development. She is unable to use her offloading shoe due to having to walk for long periods due to needing public transportation. She continues to use silver alginate to the wound. She denies signs of infection. 7/8; patient presents for follow-up. She missed her appointment last week. She reports increased callus to the surrounding wound area. She continues to use silver alginate. She denies signs of infection. 7/14; patient presents for 1 week follow-up. She has been using silver alginate to the wound bed. She has no issues or complaints today. She denies signs of infection. 7/28; patient presents for 2-week follow-up. She has been using silver alginate to the wound bed. She has been moving to a new location over the past week. She has no issues or complaints today. She denies signs of infection. 8/18; patient presents for follow-up. She was recently hospitalized for COVID and has not been able to follow-up for wound care. She was last seen 3 weeks ago. She reports receiving wound care while hospitalized. Since discharge of the hospital she is reported more drainage from the wound site. She denies  signs of infection. Electronic Signature(s) Signed: 07/15/2021 1:42:06 PM By: Kalman Shan DO Entered By: Kalman Shan on 07/15/2021 13:37:14 -------------------------------------------------------------------------------- Physical Exam Details Patient Name: Date of Service: LATREVA, BARTNIK 07/15/2021 12:30 PM Medical Record Number: [id number] Patient Account Number: [id number] Date of Birth/Sex: Treating RN: 03-02-1961 (60 y.o. [name] Primary Care Provider: Christiana Fuchs Other Clinician: Referring Provider: Treating Provider/Extender: Doran Stabler in Treatment: 11 Constitutional respirations regular, non-labored and within target range for patient.. Cardiovascular 2+ dorsalis pedis/posterior tibialis pulses. Psychiatric pleasant  and cooperative. Notes Right great toe: Posterior aspect with open wound and granulation and nonviable tissue present. Some surrounding callus. Post debridement granulation tissue present With undermining and significant maceration around the wound bed. She now has a new wound to the lateral aspect that is limited to skin breakdown. No obvious signs of infection on exam. Electronic Signature(s) Signed: 07/15/2021 1:42:06 PM By: Kalman Shan DO Entered By: Kalman Shan on 07/15/2021 13:38:46 -------------------------------------------------------------------------------- Physician Orders Details Patient Name: Date of Service: DAYANY, TIO 07/15/2021 12:30 PM Medical Record Number: [id number] Patient Account Number: [id number] Date of Birth/Sex: Treating RN: 1961-09-23 (60 y.o. 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 - Dr. Heber [place] Return A Bathing/ Shower/ Hygiene May shower with protection but do not get wound dressing(s) wet. - May use cast wrap for protection Edema Control - Lymphedema / SCD / Other Elevate legs to the level of the heart or above for 30 minutes daily and/or when sitting, a frequency of: - 3-4 times throughout the day. Avoid standing for long periods of time. Off-Loading Wedge shoe to: - Patient to wear while standing and walking. Wound Treatment Wound #13 - T Great oe Wound Laterality: Right, Posterior Cleanser: Wound Cleanser (Generic) 1 x Per Day/15 Days Discharge Instructions: Cleanse the wound with wound cleanser prior to applying a clean dressing using gauze sponges, not tissue or cotton balls. Prim Dressing: KerraCel Ag Gelling Fiber Dressing, 4x5 in (silver alginate) (Generic) 1 x Per Day/15 Days ary Discharge Instructions: Apply silver alginate to wound bed as instructed Secondary Dressing: Woven Gauze Sponge, Non-Sterile 4x4 in (Generic) 1 x Per Day/15 Days Discharge Instructions: Apply over primary dressing as directed. Secondary Dressing: Optifoam Non-Adhesive Dressing, 4x4 in (Generic) 1 x Per Day/15 Days Discharge Instructions: **Foam donut***Apply over primary dressing as directed. Secured With: Child psychotherapist, Sterile 2x75 (in/in) (Generic) 1 x Per Day/15 Days Discharge Instructions: Secure with stretch gauze as directed. Secured With: 20M Medipore H Soft Cloth Surgical Tape, 2x2 (in/yd) (Generic) 1 x Per Day/15 Days Discharge Instructions: Secure dressing with tape as directed. Wound #14 - T Great oe Wound Laterality: Right Cleanser: Wound Cleanser (Generic) 1 x Per Day/15 Days Discharge Instructions: Cleanse the wound  with wound cleanser prior to applying a clean dressing using gauze sponges, not tissue or cotton balls. Prim Dressing: KerraCel Ag Gelling Fiber Dressing, 4x5 in (silver alginate) (Generic) 1 x Per Day/15 Days ary Discharge Instructions: Apply silver alginate to wound bed as instructed Secondary Dressing: Woven Gauze Sponge, Non-Sterile 4x4 in (Generic) 1 x Per Day/15 Days Discharge Instructions: Apply over primary dressing as directed. Secondary Dressing: Optifoam Non-Adhesive Dressing, 4x4 in (Generic) 1 x Per Day/15 Days Discharge Instructions: **Foam donut***Apply over primary dressing as directed. Secured With: Child psychotherapist, Sterile 2x75 (in/in) (Generic) 1 x Per Day/15 Days Discharge Instructions: Secure with stretch gauze as directed. Secured With: 20M Medipore H Soft Cloth Surgical Tape, 2x2 (in/yd) (Generic) 1 x Per Day/15 Days Discharge Instructions: Secure dressing with tape as directed. Electronic Signature(s) Signed: 07/15/2021 1:42:06 PM By: Kalman Shan DO Entered By: Kalman Shan on  07/15/2021 13:39:10 -------------------------------------------------------------------------------- Problem List Details Patient Name: Date of Service: KERSTI, DELVECCHIO 07/15/2021 12:30 PM Medical Record Number: [id number] Patient Account Number: [id number] Date of Birth/Sex: Treating RN: 1961-02-20 (60 y.o. 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 [name] Primary Care Provider: Christiana Fuchs Other Clinician: Referring Provider: Treating Provider/Extender: Doran Stabler in Treatment: 11 Subjective Chief Complaint Information obtained from Patient wound to the distal portion of the right great toe History of Present Illness (HPI) The following HPI elements were documented for the patient's wound: Location: notice some discharge and callus on the right big toe Quality: Patient reports experiencing a dull pain to affected area(s). Severity: Patient states wound(s) are getting worse. Duration: Patient has had the wound for < 2 weeks prior to presenting for treatment Timing: Pain in wound is Intermittent (comes and goes Context: The wound occurred when the patient was cutting her toenails and noticed some discharge from this area and was very concerned about it. Modifying Factors: Patient wound(s)/ulcer(s) are improving due to:he has seen her PCP who put her on Bactrim and Keflex antibiotic Associated Signs and Symptoms: Patient reports having decrease discharge she started on her antibiotic. Ms. Faryal Behr is a 60 year old female with a past medical history of uncontrolled type 2 diabetes on insulin, osteomyelitis with amputations of 1st and 2nd toes of her left foot that presents to clinic for wound care to the right great toe wound. She states she noticed the wound 3 weeks ago. Not quite sure how it started. She  has been using silver alginate to the area. She was recently hospitalized from a clinic visit in the internal medicine clinic for this issue. While hospitalized she had an MRI of the right foot that showed likely early acute osteomyelitis with possible septic arthritis. She was discharged on ciprofloxacin for 28 days. She currently does not deny pain increased warmth or erythema to the area. She has some drainage but not purulent. 6/9; patient presents for 1 week follow-up. She has no complaints today and denies signs of infection. She missed her infectious disease appointment yesterday but has it rescheduled for the 21st. She has been using silver alginate to the wound. 6/23; patient presents for 2-week follow-up. She followed up with Dr. Megan Salon, infectious disease 2 days ago. She had some blood work done and was told to finish ciprofloxacin. She is having a follow-up virtual encounter next week. There has been an increase in callus development. She is unable to use her offloading shoe due to having to walk for long periods due to needing public transportation. She continues to use silver alginate to the wound. She denies signs of infection. 7/8; patient presents for follow-up. She missed her appointment last week. She  reports increased callus to the surrounding wound area. She continues to use silver alginate. She denies signs of infection. 7/14; patient presents for 1 week follow-up. She has been using silver alginate to the wound bed. She has no issues or complaints today. She denies signs of infection. 7/28; patient presents for 2-week follow-up. She has been using silver alginate to the wound bed. She has been moving to a new location over the past week. She has no issues or complaints today. She denies signs of infection. 8/18; patient presents for follow-up. She was recently hospitalized for COVID and has not been able to follow-up for wound care. She was last seen 3 weeks ago. She reports  receiving wound care while hospitalized. Since discharge of the hospital she is reported more drainage from the wound site. She denies signs of infection. Patient History Information obtained from Patient. Family History Diabetes - Father,Child, Hypertension - Mother,Father, No family history of Cancer, Heart Disease, Hereditary Spherocytosis, Kidney Disease, Lung Disease, Seizures, Stroke, Thyroid Problems, Tuberculosis. Social History Never smoker, Marital Status - Divorced, Alcohol Use - Never, Drug Use - No History, Caffeine Use - Rarely - soda. Medical History Eyes Denies history of Cataracts, Glaucoma, Optic Neuritis Ear/Nose/Mouth/Throat Denies history of Chronic sinus problems/congestion, Middle ear problems Hematologic/Lymphatic Patient has history of Anemia Denies history of Hemophilia, Human Immunodeficiency Virus, Lymphedema, Sickle Cell Disease Respiratory Patient has history of Asthma Denies history of Aspiration, Chronic Obstructive Pulmonary Disease (COPD), Pneumothorax, Sleep Apnea, Tuberculosis Cardiovascular Patient has history of Hypertension Denies history of Angina, Arrhythmia, Congestive Heart Failure, Coronary Artery Disease, Deep Vein Thrombosis, Hypotension, Myocardial Infarction, Peripheral Arterial Disease, Peripheral Venous Disease, Phlebitis, Vasculitis Gastrointestinal Denies history of Cirrhosis , Colitis, Crohnoos, Hepatitis A, Hepatitis B, Hepatitis C Endocrine Patient has history of Type II Diabetes Denies history of Type I Diabetes Genitourinary Denies history of End Stage Renal Disease Immunological Denies history of Lupus Erythematosus, Raynaudoos, Scleroderma Integumentary (Skin) Denies history of History of Burn Musculoskeletal Patient has history of Osteomyelitis - hx left great toe Denies history of Gout, Rheumatoid Arthritis, Osteoarthritis Neurologic Patient has history of Neuropathy Denies history of Dementia, Quadriplegia,  Paraplegia, Seizure Disorder Oncologic Denies history of Received Chemotherapy, Received Radiation Psychiatric Patient has history of Confinement Anxiety Denies history of Anorexia/bulimia Patient is treated with Insulin, Oral Agents. Blood sugar is tested. Blood sugar results noted at the following times: Lunch - 120-160. Hospitalization/Surgery History - left second toe amputation. - left great toe amputation. - c-section x4. Medical A Surgical History Notes nd Gastrointestinal GERD Objective Constitutional respirations regular, non-labored and within target range for patient.. Vitals Time Taken: 12:41 PM, Temperature: 99.2 F, Pulse: 106 bpm, Respiratory Rate: 20 breaths/min, Blood Pressure: 149/86 mmHg. Cardiovascular 2+ dorsalis pedis/posterior tibialis pulses. Psychiatric pleasant and cooperative. General Notes: Right great toe: Posterior aspect with open wound and granulation and nonviable tissue present. Some surrounding callus. Post debridement granulation tissue present With undermining and significant maceration around the wound bed. She now has a new wound to the lateral aspect that is limited to skin breakdown. No obvious signs of infection on exam. Integumentary (Hair, Skin) Wound #13 status is Open. Original cause of wound was Gradually Appeared. The date acquired was: 04/05/2021. The wound has been in treatment 11 weeks. The wound is located on the Rankin. The wound measures 0.2cm length x 0.6cm width x 0.3cm depth; 0.094cm^2 area and 0.028cm^3 volume. oe There is Fat Layer (Subcutaneous Tissue) exposed. There is no tunneling or undermining noted. There is  a medium amount of sanguinous drainage noted. The wound margin is well defined and not attached to the wound base. There is large (67-100%) pink, pale granulation within the wound bed. There is no necrotic tissue within the wound bed. General Notes: maceration noted Wound #14 status is Open. Original  cause of wound was Gradually Appeared. The date acquired was: 07/12/2021. The wound is located on the Right T Great. oe The wound measures 0.6cm length x 0.9cm width x 0.1cm depth; 0.424cm^2 area and 0.042cm^3 volume. There is Fat Layer (Subcutaneous Tissue) exposed. There is no tunneling or undermining noted. There is a medium amount of serosanguineous drainage noted. The wound margin is distinct with the outline attached to the wound base. There is large (67-100%) red granulation within the wound bed. There is no necrotic tissue within the wound bed. 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 Post debridement she has healthy granulation tissue present and I cannot appreciate any tunneling. She now has a new wound to the lateral aspect of the toe limited to skin breakdown. She is changing her dressing every other day and I recommended at this time to do daily dressing changes to see if this will help with drainage. I also recommended keeping the area padded to avoid friction and pressure to these areas. She was recently hospitalized for COVID and found to have blood sugars in the 400s. Looking back at her hemoglobin A1c's these have always been elevated in the nines and tens. We had a discussion about the importance of glycemic control for wound healing. Procedures Wound #13 Pre-procedure diagnosis of Wound #13 is a Diabetic Wound/Ulcer of the Lower Extremity located on the [place] .Severity of Tissue Pre oe Debridement is: Fat layer exposed. There was a Excisional Skin/Subcutaneous Tissue Debridement with a total area of 0.12 sq cm performed by Kalman Shan, DO. With the following  instrument(s): Curette to remove Non-Viable tissue/material. Material removed includes Callus and Subcutaneous Tissue and after achieving pain control using Other (Benzocaine). No specimens were taken. A time out was conducted at 13:08, prior to the start of the procedure. A Minimum amount of bleeding was controlled with Pressure. The procedure was tolerated well. Post Debridement Measurements: 0.2cm length x 0.6cm width x 0.3cm depth; 0.028cm^3 volume. Character of Wound/Ulcer Post Debridement is stable. Severity of Tissue Post Debridement is: Fat layer exposed. Post procedure Diagnosis Wound #13: Same as Pre-Procedure Wound #14 Pre-procedure diagnosis of Wound #14 is a Diabetic Wound/Ulcer of the Lower Extremity located on the Right T Great .Severity of Tissue Pre Debridement oe is: Fat layer exposed. There was a Excisional Skin/Subcutaneous Tissue Debridement with a total area of 0.54 sq cm performed by Kalman Shan, DO. With the following instrument(s): Curette to remove Non-Viable tissue/material. Material removed includes Callus and Subcutaneous Tissue and after achieving pain control using Other (Benzocaine). No specimens were taken. A time out was conducted at 13:08, prior to the start of the procedure. A Minimum amount of bleeding was controlled with Pressure. The procedure was tolerated well. Post Debridement Measurements: 0.6cm length x 0.9cm width x 0.1cm depth; 0.042cm^3 volume. Character of Wound/Ulcer Post Debridement is stable. Severity of Tissue Post Debridement is: Fat layer exposed. Post procedure Diagnosis Wound #14: Same as Pre-Procedure Plan Follow-up Appointments:  Return Appointment in 1 week. - Dr. Heber Tyler Bathing/ Shower/ Hygiene: May shower with protection but do not get wound dressing(s) wet. - May use cast wrap for protection Edema Control - Lymphedema / SCD / Other: Elevate legs to the level of the heart or above for 30 minutes daily and/or when sitting, a  frequency of: - 3-4 times throughout the day. Avoid standing for long periods of time. Off-Loading: Wedge shoe to: - Patient to wear while standing and walking. WOUND #13: - T Great Wound Laterality: Right, Posterior oe Cleanser: Wound Cleanser (Generic) 1 x Per Day/15 Days Discharge Instructions: Cleanse the wound with wound cleanser prior to applying a clean dressing using gauze sponges, not tissue or cotton balls. Prim Dressing: KerraCel Ag Gelling Fiber Dressing, 4x5 in (silver alginate) (Generic) 1 x Per Day/15 Days ary Discharge Instructions: Apply silver alginate to wound bed as instructed Secondary Dressing: Woven Gauze Sponge, Non-Sterile 4x4 in (Generic) 1 x Per Day/15 Days Discharge Instructions: Apply over primary dressing as directed. Secondary Dressing: Optifoam Non-Adhesive Dressing, 4x4 in (Generic) 1 x Per Day/15 Days Discharge Instructions: **Foam donut***Apply over primary dressing as directed. Secured With: Child psychotherapist, Sterile 2x75 (in/in) (Generic) 1 x Per Day/15 Days Discharge Instructions: Secure with stretch gauze as directed. Secured With: 22M Medipore H Soft Cloth Surgical T ape, 2x2 (in/yd) (Generic) 1 x Per Day/15 Days Discharge Instructions: Secure dressing with tape as directed. WOUND #14: - T Great Wound Laterality: Right oe Cleanser: Wound Cleanser (Generic) 1 x Per Day/15 Days Discharge Instructions: Cleanse the wound with wound cleanser prior to applying a clean dressing using gauze sponges, not tissue or cotton balls. Prim Dressing: KerraCel Ag Gelling Fiber Dressing, 4x5 in (silver alginate) (Generic) 1 x Per Day/15 Days ary Discharge Instructions: Apply silver alginate to wound bed as instructed Secondary Dressing: Woven Gauze Sponge, Non-Sterile 4x4 in (Generic) 1 x Per Day/15 Days Discharge Instructions: Apply over primary dressing as directed. Secondary Dressing: Optifoam Non-Adhesive Dressing, 4x4 in (Generic) 1 x Per Day/15  Days Discharge Instructions: **Foam donut***Apply over primary dressing as directed. Secured With: Child psychotherapist, Sterile 2x75 (in/in) (Generic) 1 x Per Day/15 Days Discharge Instructions: Secure with stretch gauze as directed. Secured With: 22M Medipore H Soft Cloth Surgical T ape, 2x2 (in/yd) (Generic) 1 x Per Day/15 Days Discharge Instructions: Secure dressing with tape as directed. 1. In office sharp debridement 2. Continue silver alginate but change this daily 3. Follow-up in 1 week Electronic Signature(s) Signed: 07/15/2021 1:42:06 PM By: Kalman Shan DO Entered By: Kalman Shan on 07/15/2021 13:41:35 -------------------------------------------------------------------------------- HxROS Details Patient Name: Date of Service: ANNELISSE, [place] 07/15/2021 12:30 PM Medical Record Number: [id number] Patient Account Number: [id number] Date of Birth/Sex: Treating RN: 08/08/1961 (60 y.o. [name] Primary Care Provider: Other Clinician: Christiana Fuchs Referring Provider: Treating Provider/Extender: Doran Stabler in Treatment: 11 Label Progress Note Print Version as History and Physical for this encounter Information Obtained From Patient Eyes Medical History: Negative for: Cataracts; Glaucoma; Optic Neuritis Ear/Nose/Mouth/Throat Medical History: Negative for: Chronic sinus problems/congestion; Middle ear problems Hematologic/Lymphatic Medical History: Positive for: Anemia Negative for: Hemophilia; Human Immunodeficiency Virus; Lymphedema; Sickle Cell Disease Respiratory Medical History: Positive for: Asthma Negative for: Aspiration; Chronic Obstructive Pulmonary Disease (COPD); Pneumothorax; Sleep Apnea; Tuberculosis Cardiovascular Medical History: Positive for: Hypertension Negative for: Angina; Arrhythmia; Congestive Heart Failure; Coronary Artery Disease; Deep Vein Thrombosis; Hypotension; Myocardial  Infarction; Peripheral Arterial Disease; Peripheral Venous Disease; Phlebitis; Vasculitis Gastrointestinal Medical History: Negative  for: Cirrhosis ; Colitis; Crohns; Hepatitis A; Hepatitis B; Hepatitis C Past Medical History Notes: GERD Endocrine Medical History: Positive for: Type II Diabetes Negative for: Type I Diabetes Time with diabetes: 67 years Treated with: Insulin, Oral agents Blood sugar tested every day: Yes Tested : 2 times per day Blood sugar testing results: Lunch: 120-160 Genitourinary Medical History: Negative for: End Stage Renal Disease Immunological Medical History: Negative for: Lupus Erythematosus; Raynauds; Scleroderma Integumentary (Skin) Medical History: Negative for: History of Burn Musculoskeletal Medical History: Positive for: Osteomyelitis - hx left great toe Negative for: Gout; Rheumatoid Arthritis; Osteoarthritis Neurologic Medical History: Positive for: Neuropathy Negative for: Dementia; Quadriplegia; Paraplegia; Seizure Disorder Oncologic Medical History: Negative for: Received Chemotherapy; Received Radiation Psychiatric Medical History: Positive for: Confinement Anxiety Negative for: Anorexia/bulimia Immunizations Pneumococcal Vaccine: Received Pneumococcal Vaccination: Yes Received Pneumococcal Vaccination On or After 60th Birthday: No Immunization Notes: pt. 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 c-section x4 Family and Social History Cancer: No; Diabetes: Yes - Father,Child; Heart Disease: No; Hereditary Spherocytosis: No; Hypertension: Yes - Mother,Father; Kidney Disease: No; Lung Disease: No; Seizures: No; Stroke: No; Thyroid Problems: No; Tuberculosis: No; Never smoker; Marital Status - Divorced; Alcohol Use: Never; Drug Use: No History; Caffeine Use: Rarely - soda; Financial Concerns: No; Food, Clothing or  Shelter Needs: No; Support System Lacking: No; Transportation Concerns: No Electronic Signature(s) Signed: 07/15/2021 1:42:06 PM By: Kalman Shan DO Signed: 07/15/2021 5:10:54 PM By: Rhae Hammock RN Entered By: Kalman Shan on 07/15/2021 13:37:22 -------------------------------------------------------------------------------- SuperBill Details Patient Name: Date of Service: HAROLYN, [place] 07/15/2021 Medical Record Number: [id number] Patient Account Number: [id number] Date of Birth/Sex: Treating RN: 09/23/1961 (60 y.o. 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

## 2021-07-19 ENCOUNTER — Other Ambulatory Visit: Payer: Self-pay | Admitting: Internal Medicine

## 2021-07-19 ENCOUNTER — Other Ambulatory Visit (HOSPITAL_COMMUNITY): Payer: Self-pay

## 2021-07-19 DIAGNOSIS — E1142 Type 2 diabetes mellitus with diabetic polyneuropathy: Secondary | ICD-10-CM

## 2021-07-19 DIAGNOSIS — Z794 Long term (current) use of insulin: Secondary | ICD-10-CM

## 2021-07-19 MED ORDER — METFORMIN HCL ER 500 MG PO TB24
1000.0000 mg | ORAL_TABLET | Freq: Two times a day (BID) | ORAL | 3 refills | Status: DC
  Filled 2021-07-19: qty 120, 30d supply, fill #0
  Filled 2021-09-06: qty 120, 30d supply, fill #1

## 2021-07-19 MED ORDER — HYDROCHLOROTHIAZIDE 25 MG PO TABS
25.0000 mg | ORAL_TABLET | Freq: Every day | ORAL | 1 refills | Status: DC
  Filled 2021-07-19: qty 30, 30d supply, fill #0

## 2021-07-19 MED ORDER — LISINOPRIL 5 MG PO TABS
5.0000 mg | ORAL_TABLET | Freq: Every day | ORAL | 3 refills | Status: DC
  Filled 2021-07-19: qty 30, 30d supply, fill #0
  Filled 2021-09-06: qty 30, 30d supply, fill #1

## 2021-07-19 NOTE — Telephone Encounter (Signed)
Will refill medications.  Would like to see patient within next month for follow-up on Diabetes and hypertension.

## 2021-07-22 ENCOUNTER — Other Ambulatory Visit: Payer: Self-pay

## 2021-07-22 ENCOUNTER — Encounter (HOSPITAL_BASED_OUTPATIENT_CLINIC_OR_DEPARTMENT_OTHER): Payer: Medicaid Other | Admitting: Internal Medicine

## 2021-07-22 DIAGNOSIS — E11621 Type 2 diabetes mellitus with foot ulcer: Secondary | ICD-10-CM | POA: Diagnosis not present

## 2021-07-22 DIAGNOSIS — E1165 Type 2 diabetes mellitus with hyperglycemia: Secondary | ICD-10-CM

## 2021-07-22 DIAGNOSIS — M86171 Other acute osteomyelitis, right ankle and foot: Secondary | ICD-10-CM

## 2021-07-22 DIAGNOSIS — L97519 Non-pressure chronic ulcer of other part of right foot with unspecified severity: Secondary | ICD-10-CM

## 2021-07-27 NOTE — Progress Notes (Signed)
ENA, FRICKEY ([id number]) Visit Report for 07/22/2021 Chief Complaint Document Details Patient Name: Date of Service: [name], [name] 07/22/2021 10:00 A M Medical Record Number: [id number] Patient Account Number: [id number] Date of Birth/Sex: Treating RN: 1961/08/14 (60 y.o. [name] Primary Care Provider: Christiana Fuchs Other Clinician: Referring Provider: Treating Provider/Extender: Doran Stabler in Treatment: 12 Information Obtained from: Patient Chief Complaint wound to the distal portion of the right great toe Electronic Signature(s) Signed: 07/22/2021 11:53:38 AM By: Kalman Shan DO Entered By: Kalman Shan on 07/22/2021 10:57:30 -------------------------------------------------------------------------------- HPI Details Patient Name: Date of Service: [name], [name] 07/22/2021 10:00 [place] Record Number: [id number] Patient Account Number: [id number] Date of Birth/Sex: Treating RN: [birth year] (60 y.o. [name] Primary Care Provider: Christiana Fuchs Other Clinician: Referring Provider: Treating Provider/Extender: Doran Stabler in Treatment: 12 History of Present Illness Location: notice some discharge and callus on the right big toe Quality: Patient reports experiencing a dull pain to affected area(s). Severity: Patient states wound(s) are getting worse. Duration: Patient has had the wound for < 2 weeks prior to presenting for treatment Timing: Pain in wound is Intermittent (comes and goes Context: The wound occurred when the patient was cutting her toenails and noticed some discharge from this area and was very concerned about it. Modifying Factors: Patient wound(s)/ulcer(s) are improving due to:he has seen her PCP who put her on Bactrim and Keflex antibiotic ssociated Signs and Symptoms: Patient reports having decrease discharge she started on her antibiotic. A HPI Description: Ms.  Ellani Rivera is a 60 year old female with a past medical history of uncontrolled type 2 diabetes on insulin, osteomyelitis with amputations of 1st and 2nd toes of her left foot that presents to clinic for wound care to the right great toe wound. She states she noticed the wound 3 weeks ago. Not quite sure how it started. She has been using silver alginate to the area. She was recently hospitalized from a clinic visit in the internal medicine clinic for this issue. While hospitalized she had an MRI of the right foot that showed likely early acute osteomyelitis with possible septic arthritis. She was discharged on ciprofloxacin for 28 days. She currently does not deny pain increased warmth or erythema to the area. She has some drainage but not purulent. 6/9; patient presents for 1 week follow-up. She has no complaints today and denies signs of infection. She missed her infectious disease appointment yesterday but has it rescheduled for the 21st. She has been using silver alginate to the wound. 6/23; patient presents for 2-week follow-up. She followed up with Dr. Megan Salon, infectious disease 2 days ago. She had some blood work done and was told to finish ciprofloxacin. She is having a follow-up virtual encounter next week. There has been an increase in callus development. She is unable to use her offloading shoe due to having to walk for long periods due to needing public transportation. She continues to use silver alginate to the wound. She denies signs of infection. 7/8; patient presents for follow-up. She missed her appointment last week. She reports increased callus to the surrounding wound area. She continues to use silver alginate. She denies signs of infection. 7/14; patient presents for 1 week follow-up. She has been using silver alginate to the wound bed. She has no issues or complaints today. She denies signs of infection. 7/28; patient presents for 2-week follow-up. She has been using  silver alginate to the wound bed. She has been  moving to a new location over the past week. She has no issues or complaints today. She denies signs of infection. 8/18; patient presents for follow-up. She was recently hospitalized for COVID and has not been able to follow-up for wound care. She was last seen 3 weeks ago. She reports receiving wound care while hospitalized. Since discharge of the hospital she is reported more drainage from the wound site. She denies signs of infection. 8/25; patient presents for follow-up. She has no issues or complaints today. She has been using silver alginate daily to the wound bed. She denies signs of infection. Electronic Signature(s) Signed: 07/22/2021 11:53:38 AM By: Kalman Shan DO Entered By: Kalman Shan on 07/22/2021 10:59:09 -------------------------------------------------------------------------------- Physical Exam Details Patient Name: Date of Service: [name], [name] 07/22/2021 10:00 A M Medical Record Number: [id number] Patient Account Number: [id number] Date of Birth/Sex: Treating RN: 1961/06/30 (60 y.o. [name] Primary Care Provider: Christiana Fuchs Other Clinician: Referring Provider: Treating Provider/Extender: Doran Stabler in Treatment: 12 Constitutional respirations regular, non-labored and within target range for patient.. Cardiovascular 2+ dorsalis pedis/posterior tibialis pulses. Psychiatric pleasant and cooperative. Notes Right great toe: Posterior aspect with open wound and granulation tissue present. Minimal surrounding callus. Lateral wound is scabbed over. No signs of infection. Electronic Signature(s) Signed: 07/22/2021 11:53:38 AM By: Kalman Shan DO Entered By: Kalman Shan on 07/22/2021 11:01:08 -------------------------------------------------------------------------------- Physician Orders Details Patient Name: Date of Service: [name], [name] 07/22/2021 10:00  [place] Record Number: [id number] Patient Account Number: [id number] Date of Birth/Sex: Treating RN: Jul 13, 1961 (60 y.o. 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 - with Dr. Heber [place] Return A Bathing/ Shower/ Hygiene May shower with protection but do not get wound dressing(s) wet. - May use cast wrap for protection Edema Control - Lymphedema / SCD / Other Elevate legs to the level of the heart or above for 30 minutes daily and/or when sitting, a frequency of: - 3-4 times throughout the day. Avoid standing for long periods of time. Off-Loading Wedge shoe to: - Patient to wear while standing and walking. Wound Treatment Wound #13 - T Great oe Wound Laterality: Right, Posterior Cleanser: Wound Cleanser (Generic) 1 x Per Day/15 Days Discharge Instructions: Cleanse the wound with wound cleanser prior to applying a clean dressing using gauze sponges, not tissue or cotton balls. Prim Dressing: KerraCel Ag Gelling Fiber Dressing, 4x5 in (silver alginate) (Generic) 1 x Per Day/15 Days ary Discharge Instructions: Apply silver alginate to wound bed as instructed Secondary Dressing: Woven Gauze Sponge, Non-Sterile 4x4 in (Generic) 1 x Per Day/15 Days Discharge Instructions: Apply over primary dressing as directed. Secondary Dressing: Optifoam Non-Adhesive Dressing, 4x4 in (Generic) 1 x Per Day/15 Days Discharge Instructions: **Foam donut***Apply over primary dressing as directed. Secured With:  Child psychotherapist, Sterile 2x75 (in/in) (Generic) 1 x Per Day/15 Days Discharge Instructions: Secure with stretch gauze as directed. Secured With: 93M Medipore H Soft Cloth Surgical Tape, 2x2 (in/yd) (Generic) 1 x Per Day/15 Days Discharge Instructions: Secure dressing with tape as directed. Wound #14 - T Great oe Wound Laterality: Right Cleanser:  Wound Cleanser (Generic) 1 x Per Day/15 Days Discharge Instructions: Cleanse the wound with wound cleanser prior to applying a clean dressing using gauze sponges, not tissue or cotton balls. Prim Dressing: KerraCel Ag Gelling Fiber Dressing, 4x5 in (silver alginate) (Generic) 1 x Per Day/15 Days ary Discharge Instructions: Apply silver alginate to wound bed as instructed Secondary Dressing: Woven Gauze Sponge, Non-Sterile 4x4 in (Generic) 1 x Per Day/15 Days Discharge Instructions: Apply over primary dressing as directed. Secondary Dressing: Optifoam Non-Adhesive Dressing, 4x4 in (Generic) 1 x Per Day/15 Days Discharge Instructions: **Foam donut***Apply over primary dressing as directed. Secured With: Child psychotherapist, Sterile 2x75 (in/in) (Generic) 1 x Per Day/15 Days Discharge Instructions: Secure with stretch gauze as directed. Secured With: 28M Medipore H Soft Cloth Surgical Tape, 2x2 (in/yd) (Generic) 1 x Per Day/15 Days Discharge Instructions: Secure dressing with tape as directed. Electronic Signature(s) Signed: 07/22/2021 11:53:38 AM By: Kalman Shan DO Entered By: Kalman Shan on 07/22/2021 11:01:26 -------------------------------------------------------------------------------- Problem List Details Patient Name: Date of Service: [name], [name] 07/22/2021 10:00 A M Medical Record Number: [id number] Patient Account Number: [id number] Date of Birth/Sex: Treating RN: November 08, 1961 (60 y.o. 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 [name] Primary Care Provider: Christiana Fuchs Other Clinician: Referring Provider: Treating Provider/Extender: Doran Stabler in Treatment: 12 Subjective Chief Complaint Information obtained from Patient wound to the distal portion of the right great toe History of Present Illness (HPI) The following HPI elements were documented for the patient's wound: Location: notice some discharge and callus on the right big toe Quality: Patient reports experiencing a dull pain to affected area(s). Severity: Patient states wound(s) are getting worse. Duration: Patient has had the wound for < 2 weeks prior to presenting for treatment Timing: Pain in wound is Intermittent (comes and goes Context: The wound occurred when the patient was cutting her toenails and noticed some discharge from this area and was very concerned about it. Modifying Factors: Patient wound(s)/ulcer(s) are improving due to:he has seen her PCP who put her on Bactrim  and Keflex antibiotic Associated Signs and Symptoms: Patient reports having decrease discharge she started on her antibiotic. [name] is a 60 year old female with a past medical history of uncontrolled type 2 diabetes on insulin, osteomyelitis with amputations of 1st and 2nd toes of her left foot that presents to clinic for wound care to the right great toe wound. She states she noticed the wound 3 weeks ago. Not quite sure how it started. She has been using silver alginate to the area. She was recently hospitalized from a clinic visit in the internal medicine clinic for this issue. While hospitalized she had an MRI of the right foot that showed likely early  acute osteomyelitis with possible septic arthritis. She was discharged on ciprofloxacin for 28 days. She currently does not deny pain increased warmth or erythema to the area. She has some drainage but not purulent. 6/9; patient presents for 1 week follow-up. She has no complaints today and denies signs of infection. She missed her infectious disease appointment yesterday but has it rescheduled for the 21st. She has been using silver alginate to the wound. 6/23; patient presents for 2-week follow-up. She followed up with Dr. Megan Salon, infectious disease 2 days ago. She had some blood work done and was told to finish ciprofloxacin. She is having a follow-up virtual encounter next week. There has been an increase in callus development. She is unable to use her offloading shoe due to having to walk for long periods due to needing public transportation. She continues to use silver alginate to the wound. She denies signs of infection. 7/8; patient presents for follow-up. She missed her appointment last week. She reports increased callus to the surrounding wound area. She continues to use silver alginate. She denies signs of infection. 7/14; patient presents for 1 week follow-up. She has been using silver alginate to the wound bed. She has  no issues or complaints today. She denies signs of infection. 7/28; patient presents for 2-week follow-up. She has been using silver alginate to the wound bed. She has been moving to a new location over the past week. She has no issues or complaints today. She denies signs of infection. 8/18; patient presents for follow-up. She was recently hospitalized for COVID and has not been able to follow-up for wound care. She was last seen 3 weeks ago. She reports receiving wound care while hospitalized. Since discharge of the hospital she is reported more drainage from the wound site. She denies signs of infection. 8/25; patient presents for follow-up. She has no issues or complaints today. She has been using silver alginate daily to the wound bed. She denies signs of infection. Patient History Information obtained from Patient. Family History Diabetes - Father,Child, Hypertension - Mother,Father, No family history of Cancer, Heart Disease, Hereditary Spherocytosis, Kidney Disease, Lung Disease, Seizures, Stroke, Thyroid Problems, Tuberculosis. Social History Never smoker, Marital Status - Divorced, Alcohol Use - Never, Drug Use - No History, Caffeine Use - Rarely - soda. Medical History Eyes Denies history of Cataracts, Glaucoma, Optic Neuritis Ear/Nose/Mouth/Throat Denies history of Chronic sinus problems/congestion, Middle ear problems Hematologic/Lymphatic Patient has history of Anemia Denies history of Hemophilia, Human Immunodeficiency Virus, Lymphedema, Sickle Cell Disease Respiratory Patient has history of Asthma Denies history of Aspiration, Chronic Obstructive Pulmonary Disease (COPD), Pneumothorax, Sleep Apnea, Tuberculosis Cardiovascular Patient has history of Hypertension Denies history of Angina, Arrhythmia, Congestive Heart Failure, Coronary Artery Disease, Deep Vein Thrombosis, Hypotension, Myocardial Infarction, Peripheral Arterial Disease, Peripheral Venous Disease, Phlebitis,  Vasculitis Gastrointestinal Denies history of Cirrhosis , Colitis, Crohnoos, Hepatitis A, Hepatitis B, Hepatitis C Endocrine Patient has history of Type II Diabetes Denies history of Type I Diabetes Genitourinary Denies history of End Stage Renal Disease Immunological Denies history of Lupus Erythematosus, Raynaudoos, Scleroderma Integumentary (Skin) Denies history of History of Burn Musculoskeletal Patient has history of Osteomyelitis - hx left great toe Denies history of Gout, Rheumatoid Arthritis, Osteoarthritis Neurologic Patient has history of Neuropathy Denies history of Dementia, Quadriplegia, Paraplegia, Seizure Disorder Oncologic Denies history of Received Chemotherapy, Received Radiation Psychiatric Patient has history of Confinement Anxiety Denies history of Anorexia/bulimia Patient is treated with Insulin, Oral Agents. Blood sugar is tested. Blood sugar results noted at  the following times: Lunch - 120-160. Hospitalization/Surgery History - left second toe amputation. - left great toe amputation. - c-section x4. Medical A Surgical History Notes nd Gastrointestinal GERD Objective Constitutional respirations regular, non-labored and within target range for patient.. Vitals Time Taken: 10:14 AM, Temperature: 98.1 F, Pulse: 103 bpm, Respiratory Rate: 20 breaths/min, Blood Pressure: 117/81 mmHg. Cardiovascular 2+ dorsalis pedis/posterior tibialis pulses. Psychiatric pleasant and cooperative. General Notes: Right great toe: Posterior aspect with open wound and granulation tissue present. Minimal surrounding callus. Lateral wound is scabbed over. No signs of infection. Integumentary (Hair, Skin) Wound #13 status is Open. Original cause of wound was Gradually Appeared. The date acquired was: 04/05/2021. The wound has been in treatment 12 weeks. The wound is located on the [place]. The wound measures 0.2cm length x 0.3cm width x 0.2cm depth; 0.047cm^2  area and 0.009cm^3 oe volume. There is Fat Layer (Subcutaneous Tissue) exposed. There is a medium amount of serosanguineous drainage noted. The wound margin is well defined and not attached to the wound base. There is large (67-100%) pink granulation within the wound bed. There is no necrotic tissue within the wound bed. Wound #14 status is Open. Original cause of wound was Gradually Appeared. The date acquired was: 07/12/2021. The wound has been in treatment 1 weeks. The wound is located on the Right T Great. The wound measures 0.1cm length x 0.1cm width x 0.1cm depth; 0.008cm^2 area and 0.001cm^3 volume. There is oe Fat Layer (Subcutaneous Tissue) exposed. There is no tunneling or undermining noted. There is a medium amount of serosanguineous drainage noted. The wound margin is distinct with the outline attached to the wound base. There is large (67-100%) pink granulation within the wound bed. There is no necrotic tissue within the wound bed. 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 The wound on the lateral side is scabbed over. I recommended continuing silver alginate daily to the plantar wound. No signs of infection on exam. Follow-up in 2 weeks Plan Follow-up Appointments: Return Appointment in 2 weeks. - with Dr. Heber [place] Bathing/ Shower/ Hygiene: May shower with protection but do not get wound dressing(s) wet. - May use cast wrap for protection Edema Control - Lymphedema / SCD / Other: Elevate legs to the level of the heart or above for 30 minutes daily and/or when sitting, a frequency of: - 3-4 times throughout the day. Avoid standing for long periods of time. Off-Loading: Wedge shoe to: - Patient to wear while standing  and walking. WOUND #13: - T Great Wound Laterality: Right, Posterior oe Cleanser: Wound Cleanser (Generic) 1 x Per Day/15 Days Discharge Instructions: Cleanse the wound with wound cleanser prior to applying a clean dressing using gauze sponges, not tissue or cotton balls. Prim Dressing: KerraCel Ag Gelling Fiber Dressing, 4x5 in (silver alginate) (Generic) 1 x Per Day/15 Days ary Discharge Instructions: Apply silver alginate to wound bed as instructed Secondary Dressing: Woven Gauze Sponge, Non-Sterile 4x4 in (Generic) 1 x Per Day/15 Days Discharge Instructions: Apply over primary dressing as directed. Secondary Dressing: Optifoam Non-Adhesive Dressing, 4x4 in (Generic) 1 x Per Day/15 Days Discharge Instructions: **Foam donut***Apply over primary dressing as directed. Secured With: Child psychotherapist, Sterile 2x75 (in/in) (Generic) 1 x Per Day/15 Days Discharge Instructions: Secure with stretch gauze as  directed. Secured With: 17M Medipore H Soft Cloth Surgical T ape, 2x2 (in/yd) (Generic) 1 x Per Day/15 Days Discharge Instructions: Secure dressing with tape as directed. WOUND #14: - T Great Wound Laterality: Right oe Cleanser: Wound Cleanser (Generic) 1 x Per Day/15 Days Discharge Instructions: Cleanse the wound with wound cleanser prior to applying a clean dressing using gauze sponges, not tissue or cotton balls. Prim Dressing: KerraCel Ag Gelling Fiber Dressing, 4x5 in (silver alginate) (Generic) 1 x Per Day/15 Days ary Discharge Instructions: Apply silver alginate to wound bed as instructed Secondary Dressing: Woven Gauze Sponge, Non-Sterile 4x4 in (Generic) 1 x Per Day/15 Days Discharge Instructions: Apply over primary dressing as directed. Secondary Dressing: Optifoam Non-Adhesive Dressing, 4x4 in (Generic) 1 x Per Day/15 Days Discharge Instructions: **Foam donut***Apply over primary dressing as directed. Secured With: Child psychotherapist, Sterile 2x75  (in/in) (Generic) 1 x Per Day/15 Days Discharge Instructions: Secure with stretch gauze as directed. Secured With: 17M Medipore H Soft Cloth Surgical T ape, 2x2 (in/yd) (Generic) 1 x Per Day/15 Days Discharge Instructions: Secure dressing with tape as directed. 1. Continue silver alginate daily 2. Follow-up in 2 weeks Electronic Signature(s) Signed: 07/22/2021 11:53:38 AM By: Kalman Shan DO Entered By: Kalman Shan on 07/22/2021 11:13:26 -------------------------------------------------------------------------------- HxROS Details Patient Name: Date of Service: [name], [place] 07/22/2021 10:00 A M Medical Record Number: [id number] Patient Account Number: [id number] Date of Birth/Sex: Treating RN: 1961/08/21 (60 y.o. [name] Primary Care Provider: Christiana Fuchs Other Clinician: Referring Provider: Treating Provider/Extender: Doran Stabler in Treatment: 12 Label Progress Note Print Version as History and Physical for this encounter Information Obtained From Patient Eyes Medical History: Negative for: Cataracts; Glaucoma; Optic Neuritis Ear/Nose/Mouth/Throat Medical History: Negative for: Chronic sinus problems/congestion; Middle ear problems Hematologic/Lymphatic Medical History: Positive for: Anemia Negative for: Hemophilia; Human Immunodeficiency Virus; Lymphedema; Sickle Cell Disease Respiratory Medical History: Positive for: Asthma Negative for: Aspiration; Chronic Obstructive Pulmonary Disease (COPD); Pneumothorax; Sleep Apnea; Tuberculosis Cardiovascular Medical History: Positive for: Hypertension Negative for: Angina; Arrhythmia; Congestive Heart Failure; Coronary Artery Disease; Deep Vein Thrombosis; Hypotension; Myocardial Infarction; Peripheral Arterial Disease; Peripheral Venous Disease; Phlebitis; Vasculitis Gastrointestinal Medical History: Negative for: Cirrhosis ; Colitis; Crohns; Hepatitis A; Hepatitis B;  Hepatitis C Past Medical History Notes: GERD Endocrine Medical History: Positive for: Type II Diabetes Negative for: Type I Diabetes Time with diabetes: 31 years Treated with: Insulin, Oral agents Blood sugar tested every day: Yes Tested : 2 times per day Blood sugar testing results: Lunch: 120-160 Genitourinary Medical History: Negative for: End Stage Renal Disease Immunological Medical History: Negative for: Lupus Erythematosus; Raynauds; Scleroderma Integumentary (Skin) Medical History: Negative for: History of Burn Musculoskeletal Medical History: Positive for: Osteomyelitis - hx left great toe Negative for: Gout; Rheumatoid Arthritis; Osteoarthritis Neurologic Medical History: Positive for: Neuropathy Negative for: Dementia; Quadriplegia; Paraplegia; Seizure Disorder Oncologic Medical History: Negative for: Received Chemotherapy; Received Radiation Psychiatric Medical History: Positive for: Confinement Anxiety Negative for: Anorexia/bulimia Immunizations Pneumococcal Vaccine: Received Pneumococcal Vaccination: Yes Received Pneumococcal Vaccination On or After 60th Birthday: No Immunization Notes: pt. 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 c-section x4 Family and Social History Cancer: No; Diabetes: Yes - Father,Child; Heart Disease: No; Hereditary Spherocytosis: No; Hypertension: Yes - Mother,Father; Kidney Disease: No; Lung Disease: No; Seizures: No; Stroke: No; Thyroid Problems: No; Tuberculosis: No; Never smoker; Marital Status - Divorced; Alcohol Use: Never; Drug Use: No History; Caffeine Use: Rarely -  soda; Financial Concerns: No; Food, Clothing or Shelter Needs: No; Support System Lacking: No; Transportation Concerns: No Electronic Signature(s) Signed: 07/22/2021 11:53:38 AM By: Kalman Shan DO Signed: 07/27/2021 5:24:19 PM By:  Rhae Hammock RN Entered By: Kalman Shan on 07/22/2021 11:00:25 -------------------------------------------------------------------------------- SuperBill Details Patient Name: Date of Service: [name], [name] 07/22/2021 Medical Record Number: [id number] Patient Account Number: [id number] Date of Birth/Sex: Treating RN: 06/13/1961 (60 y.o. 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 E11.65 Type 2 diabetes mellitus with hyperglycemia Facility Procedures CPT4 Code: [id number] Description: 99213 - WOUND CARE VISIT-LEV 3 EST PT Modifier: Quantity: 1 Physician Procedures : CPT4 Code Description Modifier [id number] - WC PHYS LEVEL 3 - EST PT ICD-10 Diagnosis Description E11.621 Type 2 diabetes mellitus with foot ulcer M86.171 Other acute osteomyelitis, right ankle and foot L97.519 Non-pressure chronic ulcer of other  part of right foot with unspecified severity E11.65 Type 2 diabetes mellitus with hyperglycemia Quantity: 1 Electronic Signature(s) Signed: 07/22/2021 11:53:38 AM By: Kalman Shan DO Entered By: Kalman Shan on 07/22/2021 11:13:41

## 2021-07-27 NOTE — Progress Notes (Addendum)
[name], [name] ([id number]) Visit Report for 07/22/2021 Arrival Information Details Patient Name: Date of Service: [name], [name] 07/22/2021 10:00 [place] Record Number: [id number] Patient Account Number: [id number] Date of Birth/Sex: Treating RN: 06-May-1961 (60 y.o. [name], [name] Primary Care Susie Pousson: Christiana Fuchs Other Clinician: Referring Marquavion Venhuizen: Treating Ravinder Hofland/Extender: Doran Stabler in Treatment: 12 Visit Information History Since Last Visit Added or deleted any medications: No Patient Arrived: Ambulatory Any new allergies or adverse reactions: No Arrival Time: 10:13 Had a fall or experienced change in No Accompanied By: self activities of daily living that may affect Transfer Assistance: None risk of falls: Patient Identification Verified: Yes Signs or symptoms of abuse/neglect since last visito No Secondary Verification Process Completed: Yes Hospitalized since last visit: No Patient Requires Transmission-Based Precautions: No Implantable device outside of the clinic excluding No Patient Has Alerts: Yes cellular tissue based products placed in the center Patient Alerts: ABI R=1.18 since last visit: ABI L=1.1 Has Dressing in Place as Prescribed: Yes Pain Present Now: No Electronic Signature(s) Signed: 07/26/2021 3:13:54 PM By: Sandre Kitty Entered By: Sandre Kitty on 07/22/2021 10:14:06 -------------------------------------------------------------------------------- Clinic Level of Care Assessment Details Patient Name: Date of Service: [name], [name] 07/22/2021 10:00 [place] Record Number: [id number] Patient Account Number: [id number] Date of Birth/Sex: Treating RN: 1961-10-27 (60 y.o. Nancy Fetter Primary Care Peterson Mathey: Christiana Fuchs Other Clinician: Referring Francile Woolford: Treating Latifah Padin/Extender: Doran Stabler in Treatment: 12 Clinic Level of Care Assessment Items TOOL 4  Quantity Score X- 1 0 Use when only an EandM is performed on FOLLOW-UP visit ASSESSMENTS - Nursing Assessment / Reassessment X- 1 10 Reassessment of Co-morbidities (includes updates in patient status) X- 1 5 Reassessment of Adherence to Treatment Plan ASSESSMENTS - Wound and Skin A ssessment / Reassessment '[]'  - 0 Simple Wound Assessment / Reassessment - one wound X- 2 5 Complex Wound Assessment / Reassessment - multiple wounds '[]'  - 0 Dermatologic / Skin Assessment (not related to wound area) ASSESSMENTS - Focused Assessment '[]'  - 0 Circumferential Edema Measurements - multi extremities '[]'  - 0 Nutritional Assessment / Counseling / Intervention X- 1 5 Lower Extremity Assessment (monofilament, tuning fork, pulses) '[]'  - 0 Peripheral Arterial Disease Assessment (using hand held doppler) ASSESSMENTS - Ostomy and/or Continence Assessment and Care '[]'  - 0 Incontinence Assessment and Management '[]'  - 0 Ostomy Care Assessment and Management (repouching, etc.) PROCESS - Coordination of Care X - Simple Patient / Family Education for ongoing care 1 15 '[]'  - 0 Complex (extensive) Patient / Family Education for ongoing care X- 1 10 Staff obtains Programmer, systems, Records, T Results / Process Orders est '[]'  - 0 Staff telephones HHA, Nursing Homes / Clarify orders / etc '[]'  - 0 Routine Transfer to another Facility (non-emergent condition) '[]'  - 0 Routine Hospital Admission (non-emergent condition) '[]'  - 0 New Admissions / Biomedical engineer / Ordering NPWT Apligraf, etc. , '[]'  - 0 Emergency Hospital Admission (emergent condition) X- 1 10 Simple Discharge Coordination '[]'  - 0 Complex (extensive) Discharge Coordination PROCESS - Special Needs '[]'  - 0 Pediatric / Minor Patient Management '[]'  - 0 Isolation Patient Management '[]'  - 0 Hearing / Language / Visual special needs '[]'  - 0 Assessment of Community assistance (transportation, D/C planning, etc.) '[]'  - 0 Additional assistance / Altered  mentation '[]'  - 0 Support Surface(s) Assessment (bed, cushion, seat, etc.) INTERVENTIONS - Wound Cleansing / Measurement '[]'  - 0 Simple Wound Cleansing - one wound X- 2 5 Complex Wound Cleansing -  multiple wounds '[]'  - 0 Wound Imaging (photographs - any number of wounds) '[]'  - 0 Wound Tracing (instead of photographs) '[]'  - 0 Simple Wound Measurement - one wound X- 2 5 Complex Wound Measurement - multiple wounds INTERVENTIONS - Wound Dressings X - Small Wound Dressing one or multiple wounds 1 10 '[]'  - 0 Medium Wound Dressing one or multiple wounds '[]'  - 0 Large Wound Dressing one or multiple wounds X- 1 5 Application of Medications - topical '[]'  - 0 Application of Medications - injection INTERVENTIONS - Miscellaneous '[]'  - 0 External ear exam '[]'  - 0 Specimen Collection (cultures, biopsies, blood, body fluids, etc.) '[]'  - 0 Specimen(s) / Culture(s) sent or taken to Lab for analysis '[]'  - 0 Patient Transfer (multiple staff / Civil Service fast streamer / Similar devices) '[]'  - 0 Simple Staple / Suture removal (25 or less) '[]'  - 0 Complex Staple / Suture removal (26 or more) '[]'  - 0 Hypo / Hyperglycemic Management (close monitor of Blood Glucose) '[]'  - 0 Ankle / Brachial Index (ABI) - do not check if billed separately X- 1 5 Vital Signs Has the patient been seen at the hospital within the last three years: Yes Total Score: 105 Level Of Care: New/Established - Level 3 Electronic Signature(s) Signed: 07/22/2021 4:57:56 PM By: Levan Hurst RN, BSN Entered By: Levan Hurst on 07/22/2021 10:49:37 -------------------------------------------------------------------------------- Complex / Palliative Patient Assessment Details Patient Name: Date of Service: [name], [name] 07/22/2021 10:00 [place] Medical Record Number: [id number] Patient Account Number: [id number] Date of Birth/Sex: Treating RN: 1961-08-31 (60 y.o. Nancy Fetter Primary Care Vasiliy Mccarry: Christiana Fuchs Other Clinician: Referring  Reghan Thul: Treating Jasmeet Manton/Extender: Doran Stabler in Treatment: 12 Palliative Management Criteria Complex Wound Management Criteria Patient has remarkable or complex co-morbidities requiring medications or treatments that extend wound healing times. Examples: Diabetes mellitus with chronic renal failure or end stage renal disease requiring dialysis Advanced or poorly controlled rheumatoid arthritis Diabetes mellitus and end stage chronic obstructive pulmonary disease Active cancer with current chemo- or radiation therapy Type 2 Diabetes, Osteomyelitis Care Approach Wound Care Plan: Complex Wound Management Electronic Signature(s) Signed: 08/09/2021 12:28:40 PM By: Kalman Shan DO Signed: 08/26/2021 4:50:18 PM By: Levan Hurst RN, BSN Entered By: Levan Hurst on 08/07/2021 12:22:06 -------------------------------------------------------------------------------- Encounter Discharge Information Details Patient Name: Date of Service: [name], [name] 07/22/2021 10:00 [place] Record Number: [id number] Patient Account Number: [id number] Date of Birth/Sex: Treating RN: December 06, 1960 (60 y.o. Nancy Fetter Primary Care Ange Puskas: Christiana Fuchs Other Clinician: Referring Cheree Fowles: Treating Lanita Stammen/Extender: Doran Stabler in Treatment: 12 Encounter Discharge Information Items Discharge Condition: Stable Ambulatory Status: Ambulatory Discharge Destination: Home Transportation: Private Auto Accompanied By: alone Schedule Follow-up Appointment: Yes Clinical Summary of Care: Patient Declined Electronic Signature(s) Signed: 07/22/2021 4:57:56 PM By: Levan Hurst RN, BSN Entered By: Levan Hurst on 07/22/2021 10:53:48 -------------------------------------------------------------------------------- Lower Extremity Assessment Details Patient Name: Date of Service: [name], [name] 07/22/2021 10:00 [place] Record Number:  [id number] Patient Account Number: [id number] Date of Birth/Sex: Treating RN: 01-Jul-1961 (60 y.o. Nancy Fetter Primary Care Myrna Vonseggern: Christiana Fuchs Other Clinician: Referring Kaniesha Barile: Treating Lelani Garnett/Extender: Doran Stabler in Treatment: 12 Edema Assessment Assessed: [Left: No] [Right: No] Edema: [Left: N] [Right: o] Calf Left: Right: Point of Measurement: 27 cm From Medial Instep 38 cm Ankle Left: Right: Point of Measurement: 9 cm From Medial Instep 23 cm Vascular Assessment Pulses: Dorsalis Pedis Palpable: [Right:Yes] Electronic Signature(s) Signed: 07/22/2021 4:57:56 PM By:  Levan Hurst RN, BSN Entered By: Levan Hurst on 07/22/2021 10:45:07 -------------------------------------------------------------------------------- Multi Wound Chart Details Patient Name: Date of Service: [name], [name] 07/22/2021 10:00 [place] Record Number: [id number] Patient Account Number: [id number] Date of Birth/Sex: Treating RN: 1961-07-24 (60 y.o. [name], [name] Primary Care Lexia Vandevender: Christiana Fuchs Other Clinician: Referring Brayley Mackowiak: Treating Danarius Mcconathy/Extender: Doran Stabler in Treatment: 12 Vital Signs Height(in): Pulse(bpm): 103 Weight(lbs): Blood Pressure(mmHg): 117/81 Body Mass Index(BMI): Temperature(F): 98.1 Respiratory Rate(breaths/min): 20 Photos: [13:Right, Posterior T Great] [14:oe Right T Great oe] [N/A:N/A N/A] Wound Location: [13:Gradually Appeared] [14:Gradually Appeared] [N/A:N/A] Wounding Event: [13:Diabetic Wound/Ulcer of the Lower] [14:Diabetic Wound/Ulcer of the Lower] [N/A:N/A] Primary Etiology: [13:Extremity Anemia, Asthma, Hypertension, Type Anemia, Asthma, Hypertension, Type N/A] [14:Extremity] Comorbid History: [13:II Diabetes, Osteomyelitis, Neuropathy, Confinement Anxiety 04/05/2021] [14:II Diabetes, Osteomyelitis, Neuropathy, Confinement Anxiety 07/12/2021] [N/A:N/A] Date Acquired:  [13:12] [14:1] [N/A:N/A] Weeks of Treatment: [13:Open] [14:Open] [N/A:N/A] Wound Status: [13:0.2x0.3x0.2] [14:0.1x0.1x0.1] [N/A:N/A] Measurements L x W x D (cm) [13:0.047] [14:0.008] [N/A:N/A] A (cm) : rea [13:0.009] [14:0.001] [N/A:N/A] Volume (cm) : [13:0.00%] [14:98.10%] [N/A:N/A] % Reduction in A rea: [13:35.70%] [14:97.60%] [N/A:N/A] % Reduction in Volume: [13:Grade 2] [14:Grade 1] [N/A:N/A] Classification: [13:Medium] [14:Medium] [N/A:N/A] Exudate A mount: [13:Serosanguineous] [14:Serosanguineous] [N/A:N/A] Exudate Type: [13:red, brown] [14:red, brown] [N/A:N/A] Exudate Color: [13:Well defined, not attached] [14:Distinct, outline attached] [N/A:N/A] Wound Margin: [13:Large (67-100%)] [14:Large (67-100%)] [N/A:N/A] Granulation A mount: [13:Pink] [14:Pink] [N/A:N/A] Granulation Quality: [13:None Present (0%)] [14:None Present (0%)] [N/A:N/A] Necrotic A mount: [13:Fat Layer (Subcutaneous Tissue): Yes Fat Layer (Subcutaneous Tissue): Yes N/A] Exposed Structures: [13:Fascia: No Tendon: No Muscle: No Joint: No Bone: No Medium (34-66%)] [14:Fascia: No Tendon: No Muscle: No Joint: No Bone: No Large (67-100%)] [N/A:N/A] Treatment Notes Wound #13 (Toe Great) Wound Laterality: Right, Posterior Cleanser Wound Cleanser Discharge Instruction: Cleanse the wound with wound cleanser prior to applying a clean dressing using gauze sponges, not tissue or cotton balls. Peri-Wound Care Topical Primary Dressing KerraCel Ag Gelling Fiber Dressing, 4x5 in (silver alginate) Discharge Instruction: Apply silver alginate to wound bed as instructed Secondary Dressing Woven Gauze Sponge, Non-Sterile 4x4 in Discharge Instruction: Apply over primary dressing as directed. Optifoam Non-Adhesive Dressing, 4x4 in Discharge Instruction: **Foam donut***Apply over primary dressing as directed. Secured With Conforming Stretch Gauze Bandage, Sterile 2x75 (in/in) Discharge Instruction: Secure with stretch gauze as  directed. 28M Medipore H Soft Cloth Surgical T ape, 2x2 (in/yd) Discharge Instruction: Secure dressing with tape as directed. Compression Wrap Compression Stockings Add-Ons Wound #14 (Toe Great) Wound Laterality: Right Cleanser Wound Cleanser Discharge Instruction: Cleanse the wound with wound cleanser prior to applying a clean dressing using gauze sponges, not tissue or cotton balls. Peri-Wound Care Topical Primary Dressing KerraCel Ag Gelling Fiber Dressing, 4x5 in (silver alginate) Discharge Instruction: Apply silver alginate to wound bed as instructed Secondary Dressing Woven Gauze Sponge, Non-Sterile 4x4 in Discharge Instruction: Apply over primary dressing as directed. Optifoam Non-Adhesive Dressing, 4x4 in Discharge Instruction: **Foam donut***Apply over primary dressing as directed. Secured With Conforming Stretch Gauze Bandage, Sterile 2x75 (in/in) Discharge Instruction: Secure with stretch gauze as directed. 28M Medipore H Soft Cloth Surgical T ape, 2x2 (in/yd) Discharge Instruction: Secure dressing with tape as directed. Compression Wrap Compression Stockings Add-Ons Electronic Signature(s) Signed: 07/22/2021 11:53:38 AM By: Kalman Shan DO Signed: 07/27/2021 5:24:19 PM By: Rhae Hammock RN Entered By: Kalman Shan on 07/22/2021 10:56:46 -------------------------------------------------------------------------------- Multi-Disciplinary Care Plan Details Patient Name: Date of Service: [name], [name] 07/22/2021 10:00 A M Medical Record Number: [id number] Patient Account Number: [id number]  Date of Birth/Sex: Treating RN: 10-May-1961 (60 y.o. Nancy Fetter Primary Care Teresina Bugaj: Christiana Fuchs Other Clinician: Referring Halston Fairclough: Treating Tish Begin/Extender: Doran Stabler in Treatment: 12 Active Inactive Nutrition Nursing Diagnoses: Impaired glucose control: actual or potential Potential for alteratiion in Nutrition/Potential  for imbalanced nutrition Goals: Patient/caregiver agrees to and verbalizes understanding of need to obtain nutritional consultation Date Initiated: 05/06/2021 Date Inactivated: 06/04/2021 Target Resolution Date: 05/27/2021 Goal Status: Met Patient/caregiver will maintain therapeutic glucose control Date Initiated: 05/06/2021 Target Resolution Date: 08/20/2021 Goal Status: Active Interventions: Assess HgA1c results as ordered upon admission and as needed Provide education on elevated blood sugars and impact on wound healing Provide education on nutrition Treatment Activities: Patient referred to Primary Care Physician for further nutritional evaluation : 05/06/2021 Notes: Wound/Skin Impairment Nursing Diagnoses: Knowledge deficit related to ulceration/compromised skin integrity Goals: Patient/caregiver will verbalize understanding of skin care regimen Date Initiated: 05/06/2021 Target Resolution Date: 08/20/2021 Goal Status: Active Interventions: Assess patient/caregiver ability to obtain necessary supplies Assess patient/caregiver ability to perform ulcer/skin care regimen upon admission and as needed Provide education on ulcer and skin care Treatment Activities: Skin care regimen initiated : 05/06/2021 Topical wound management initiated : 05/06/2021 Notes: Electronic Signature(s) Signed: 07/22/2021 4:57:56 PM By: Levan Hurst RN, BSN Entered By: Levan Hurst on 07/22/2021 10:48:08 -------------------------------------------------------------------------------- Pain Assessment Details Patient Name: Date of Service: [name], [name] 07/22/2021 10:00 [place] Record Number: [id number] Patient Account Number: [id number] Date of Birth/Sex: Treating RN: [birth year] (60 y.o. [name] Primary Care Mahrukh Seguin: Christiana Fuchs Other Clinician: Referring Kaiyan Luczak: Treating Jazzmon Prindle/Extender: Doran Stabler in Treatment: 12 Active Problems Location of Pain  Severity and Description of Pain Patient Has Paino No Site Locations Pain Management and Medication Current Pain Management: Electronic Signature(s) Signed: 07/26/2021 3:13:54 PM By: Sandre Kitty Signed: 07/27/2021 5:24:19 PM By: Rhae Hammock RN Entered By: Sandre Kitty on 07/22/2021 10:14:30 -------------------------------------------------------------------------------- Patient/Caregiver Education Details Patient Name: Date of Service: [name], [name] 8/25/2022andnbsp10:00 [place] Record Number: [id number] Patient Account Number: [id number] Date of Birth/Gender: Treating RN: [birth year] (60 y.o. Nancy Fetter Primary Care Physician: Christiana Fuchs Other Clinician: Referring Physician: Treating Physician/Extender: Doran Stabler in Treatment: 12 Education Assessment Education Provided To: Patient Education Topics Provided Wound/Skin Impairment: Methods: Explain/Verbal Responses: State content correctly [employer]) Signed: 07/22/2021 4:57:56 PM By: Levan Hurst RN, BSN Entered By: Levan Hurst on 07/22/2021 10:49:07 -------------------------------------------------------------------------------- Wound Assessment Details Patient Name: Date of Service: [name], [name] 07/22/2021 10:00 [place] Record Number: [id number] Patient Account Number: [id number] Date of Birth/Sex: Treating RN: 07-07-1961 (60 y.o. [name], [name] Primary Care Susen Haskew: Christiana Fuchs Other Clinician: Referring Teshia Mahone: Treating Marquavius Scaife/Extender: Doran Stabler in Treatment: 12 Wound Status Wound Number: 13 Primary Diabetic Wound/Ulcer of the Lower Extremity Etiology: Wound Location: Right, Posterior T Great oe Wound Open Wounding Event: Gradually Appeared Status: Date Acquired: 04/05/2021 Comorbid Anemia, Asthma, Hypertension, Type II Diabetes, Osteomyelitis, Weeks Of Treatment: 12 History:  Neuropathy, Confinement Anxiety Clustered Wound: No Photos Wound Measurements Length: (cm) 0.2 Width: (cm) 0.3 Depth: (cm) 0.2 Area: (cm) 0.047 Volume: (cm) 0.009 % Reduction in Area: 0% % Reduction in Volume: 35.7% Epithelialization: Medium (34-66%) Wound Description Classification: Grade 2 Wound Margin: Well defined, not attached Exudate Amount: Medium Exudate Type: Serosanguineous Exudate Color: red, brown Foul Odor After Cleansing: No Slough/Fibrino No Wound Bed Granulation Amount: Large (67-100%) Exposed Structure Granulation Quality: Pink Fascia Exposed: No Necrotic Amount: None Present (0%) Fat  Layer (Subcutaneous Tissue) Exposed: Yes Tendon Exposed: No Muscle Exposed: No Joint Exposed: No Bone Exposed: No Electronic Signature(s) Signed: 07/22/2021 4:57:56 PM By: Levan Hurst RN, BSN Signed: 07/27/2021 5:24:19 PM By: Rhae Hammock RN Entered By: Levan Hurst on 07/22/2021 10:46:13 -------------------------------------------------------------------------------- Wound Assessment Details Patient Name: Date of Service: [name], [facility] 07/22/2021 10:00 [place] Record Number: [id number] Patient Account Number: [id number] Date of Birth/Sex: Treating RN: March 15, 1961 (60 y.o. [name], [name] Primary Care Meilyn Heindl: Christiana Fuchs Other Clinician: Referring Santos Hardwick: Treating Quandra Fedorchak/Extender: Doran Stabler in Treatment: 12 Wound Status Wound Number: 14 Primary Diabetic Wound/Ulcer of the Lower Extremity Etiology: Wound Location: Right T Great oe Wound Open Wounding Event: Gradually Appeared Status: Date Acquired: 07/12/2021 Comorbid Anemia, Asthma, Hypertension, Type II Diabetes, Osteomyelitis, Weeks Of Treatment: 1 History: Neuropathy, Confinement Anxiety Clustered Wound: No Photos Wound Measurements Length: (cm) 0.1 Width: (cm) 0.1 Depth: (cm) 0.1 Area: (cm) 0.008 Volume: (cm) 0.001 % Reduction in Area:  98.1% % Reduction in Volume: 97.6% Epithelialization: Large (67-100%) Tunneling: No Undermining: No Wound Description Classification: Grade 1 Wound Margin: Distinct, outline attached Exudate Amount: Medium Exudate Type: Serosanguineous Exudate Color: red, brown Foul Odor After Cleansing: No Slough/Fibrino No Wound Bed Granulation Amount: Large (67-100%) Exposed Structure Granulation Quality: Pink Fascia Exposed: No Necrotic Amount: None Present (0%) Fat Layer (Subcutaneous Tissue) Exposed: Yes Tendon Exposed: No Muscle Exposed: No Joint Exposed: No Bone Exposed: No Electronic Signature(s) Signed: 07/22/2021 4:57:56 PM By: Levan Hurst RN, BSN Signed: 07/27/2021 5:24:19 PM By: Rhae Hammock RN Entered By: Levan Hurst on 07/22/2021 10:47:18 -------------------------------------------------------------------------------- Vitals Details Patient Name: Date of Service: [name], [facility] 07/22/2021 10:00 [place] Record Number: [id number] Patient Account Number: [id number] Date of Birth/Sex: Treating RN: 10-04-1961 (60 y.o. [name] Primary Care Eastin Swing: Christiana Fuchs Other Clinician: Referring Lynnley Doddridge: Treating Koree Staheli/Extender: Doran Stabler in Treatment: 12 Vital Signs Time Taken: 10:14 Temperature (F): 98.1 Pulse (bpm): 103 Respiratory Rate (breaths/min): 20 Blood Pressure (mmHg): 117/81 Reference Range: 80 - 120 mg / dl Electronic Signature(s) Signed: 07/26/2021 3:13:54 PM By: Sandre Kitty Entered By: Sandre Kitty on 07/22/2021 10:14:25

## 2021-07-28 ENCOUNTER — Other Ambulatory Visit (HOSPITAL_COMMUNITY): Payer: Self-pay

## 2021-07-29 ENCOUNTER — Other Ambulatory Visit (HOSPITAL_COMMUNITY): Payer: Self-pay

## 2021-07-30 ENCOUNTER — Other Ambulatory Visit (HOSPITAL_COMMUNITY): Payer: Self-pay

## 2021-07-30 ENCOUNTER — Other Ambulatory Visit: Payer: Self-pay | Admitting: Student

## 2021-07-30 ENCOUNTER — Other Ambulatory Visit: Payer: Self-pay | Admitting: Family Medicine

## 2021-07-30 ENCOUNTER — Other Ambulatory Visit: Payer: Self-pay | Admitting: Internal Medicine

## 2021-07-30 DIAGNOSIS — Z794 Long term (current) use of insulin: Secondary | ICD-10-CM

## 2021-07-30 DIAGNOSIS — E1142 Type 2 diabetes mellitus with diabetic polyneuropathy: Secondary | ICD-10-CM

## 2021-08-03 ENCOUNTER — Other Ambulatory Visit: Payer: Self-pay | Admitting: Internal Medicine

## 2021-08-03 ENCOUNTER — Other Ambulatory Visit: Payer: Self-pay | Admitting: Student

## 2021-08-03 ENCOUNTER — Other Ambulatory Visit (HOSPITAL_COMMUNITY): Payer: Self-pay

## 2021-08-03 DIAGNOSIS — E1142 Type 2 diabetes mellitus with diabetic polyneuropathy: Secondary | ICD-10-CM

## 2021-08-03 DIAGNOSIS — Z794 Long term (current) use of insulin: Secondary | ICD-10-CM

## 2021-08-04 ENCOUNTER — Other Ambulatory Visit: Payer: Self-pay | Admitting: Internal Medicine

## 2021-08-04 ENCOUNTER — Other Ambulatory Visit: Payer: Self-pay | Admitting: Student

## 2021-08-04 ENCOUNTER — Other Ambulatory Visit (HOSPITAL_COMMUNITY): Payer: Self-pay

## 2021-08-04 DIAGNOSIS — E1142 Type 2 diabetes mellitus with diabetic polyneuropathy: Secondary | ICD-10-CM

## 2021-08-04 DIAGNOSIS — Z794 Long term (current) use of insulin: Secondary | ICD-10-CM

## 2021-08-05 ENCOUNTER — Encounter (HOSPITAL_BASED_OUTPATIENT_CLINIC_OR_DEPARTMENT_OTHER): Payer: Self-pay | Admitting: Internal Medicine

## 2021-08-05 ENCOUNTER — Other Ambulatory Visit: Payer: Self-pay | Admitting: Student

## 2021-08-05 ENCOUNTER — Other Ambulatory Visit: Payer: Self-pay | Admitting: Internal Medicine

## 2021-08-05 ENCOUNTER — Other Ambulatory Visit (HOSPITAL_COMMUNITY): Payer: Self-pay

## 2021-08-05 DIAGNOSIS — Z794 Long term (current) use of insulin: Secondary | ICD-10-CM

## 2021-08-05 DIAGNOSIS — E1142 Type 2 diabetes mellitus with diabetic polyneuropathy: Secondary | ICD-10-CM

## 2021-08-10 ENCOUNTER — Other Ambulatory Visit: Payer: Self-pay | Admitting: Internal Medicine

## 2021-08-10 ENCOUNTER — Other Ambulatory Visit (HOSPITAL_COMMUNITY): Payer: Self-pay

## 2021-08-10 DIAGNOSIS — Z794 Long term (current) use of insulin: Secondary | ICD-10-CM

## 2021-08-10 DIAGNOSIS — E1142 Type 2 diabetes mellitus with diabetic polyneuropathy: Secondary | ICD-10-CM

## 2021-08-10 MED ORDER — INSULIN GLARGINE 100 UNIT/ML SOLOSTAR PEN
80.0000 [IU] | PEN_INJECTOR | Freq: Every day | SUBCUTANEOUS | 0 refills | Status: DC
  Filled 2021-08-10: qty 15, 18d supply, fill #0

## 2021-08-10 MED ORDER — GABAPENTIN 300 MG PO CAPS
900.0000 mg | ORAL_CAPSULE | Freq: Three times a day (TID) | ORAL | 6 refills | Status: DC
  Filled 2021-08-10 – 2021-08-11 (×2): qty 270, 30d supply, fill #0

## 2021-08-10 MED ORDER — HYDROCHLOROTHIAZIDE 25 MG PO TABS
25.0000 mg | ORAL_TABLET | Freq: Every day | ORAL | 1 refills | Status: DC
  Filled 2021-08-10 – 2021-09-06 (×2): qty 30, 30d supply, fill #0

## 2021-08-10 NOTE — Telephone Encounter (Signed)
Refill Request- Pt states she has been trying to gt her medications refilled since 08/04/2021.   gabapentin (NEURONTIN) 300 MG capsule  insulin glargine (LANTUS) 100 UNIT/ML Solostar Pen  glucose blood test strip  [facility] (Ph: [phone])

## 2021-08-10 NOTE — Telephone Encounter (Signed)
Next appt scheduled 9/23 with Dr Gilford Rile.

## 2021-08-11 ENCOUNTER — Other Ambulatory Visit (HOSPITAL_COMMUNITY): Payer: Self-pay

## 2021-08-12 ENCOUNTER — Encounter (HOSPITAL_BASED_OUTPATIENT_CLINIC_OR_DEPARTMENT_OTHER): Payer: Medicaid Other | Attending: Internal Medicine | Admitting: Internal Medicine

## 2021-08-12 ENCOUNTER — Other Ambulatory Visit: Payer: Self-pay

## 2021-08-12 DIAGNOSIS — E1151 Type 2 diabetes mellitus with diabetic peripheral angiopathy without gangrene: Secondary | ICD-10-CM | POA: Insufficient documentation

## 2021-08-12 DIAGNOSIS — M868X8 Other osteomyelitis, other site: Secondary | ICD-10-CM | POA: Insufficient documentation

## 2021-08-12 DIAGNOSIS — F419 Anxiety disorder, unspecified: Secondary | ICD-10-CM | POA: Diagnosis not present

## 2021-08-12 DIAGNOSIS — Z8616 Personal history of COVID-19: Secondary | ICD-10-CM | POA: Diagnosis not present

## 2021-08-12 DIAGNOSIS — I10 Essential (primary) hypertension: Secondary | ICD-10-CM | POA: Diagnosis not present

## 2021-08-12 DIAGNOSIS — E1140 Type 2 diabetes mellitus with diabetic neuropathy, unspecified: Secondary | ICD-10-CM | POA: Diagnosis not present

## 2021-08-12 DIAGNOSIS — E1165 Type 2 diabetes mellitus with hyperglycemia: Secondary | ICD-10-CM | POA: Insufficient documentation

## 2021-08-12 DIAGNOSIS — E11621 Type 2 diabetes mellitus with foot ulcer: Secondary | ICD-10-CM | POA: Diagnosis not present

## 2021-08-12 DIAGNOSIS — L97519 Non-pressure chronic ulcer of other part of right foot with unspecified severity: Secondary | ICD-10-CM | POA: Insufficient documentation

## 2021-08-12 DIAGNOSIS — J45909 Unspecified asthma, uncomplicated: Secondary | ICD-10-CM | POA: Diagnosis not present

## 2021-08-12 DIAGNOSIS — Z794 Long term (current) use of insulin: Secondary | ICD-10-CM | POA: Diagnosis not present

## 2021-08-12 DIAGNOSIS — M86171 Other acute osteomyelitis, right ankle and foot: Secondary | ICD-10-CM | POA: Insufficient documentation

## 2021-08-12 NOTE — Progress Notes (Signed)
MALKIE, WILLE ([id number]) Visit Report for 08/12/2021 Arrival Information Details Patient Name: Date of Service: CHELSIE, BUREL 08/12/2021 10:30 A M Medical Record Number: [id number] Patient Account Number: [id number] Date of Birth/Sex: Treating RN: July 22, 1961 (60 y.o. [name], [name] Primary Care Donterrius Santucci: Christiana Fuchs Other Clinician: Referring Orlanda Lemmerman: Treating Shannah Conteh/Extender: Doran Stabler in Treatment: 15 Visit Information History Since Last Visit Added or deleted any medications: No Patient Arrived: Ambulatory Any new allergies or adverse reactions: No Arrival Time: 10:34 Had a fall or experienced change in No Accompanied By: self activities of daily living that may affect Transfer Assistance: None risk of falls: Patient Identification Verified: Yes Signs or symptoms of abuse/neglect since last visito No Secondary Verification Process Completed: Yes Hospitalized since last visit: No Patient Requires Transmission-Based Precautions: No Implantable device outside of the clinic excluding No Patient Has Alerts: Yes cellular tissue based products placed in the center Patient Alerts: ABI R=1.18 since last visit: ABI L=1.1 Has Dressing in Place as Prescribed: Yes Pain Present Now: No Electronic Signature(s) Signed: 08/12/2021 11:03:59 AM By: Sandre Kitty Entered By: Sandre Kitty on 08/12/2021 10:35:50 -------------------------------------------------------------------------------- Encounter Discharge Information Details Patient Name: Date of Service: LANIAH, GRIMM 08/12/2021 10:30 A M Medical Record Number: [id number] Patient Account Number: [id number] Date of Birth/Sex: Treating RN: 1961-10-28 (60 y.o. [name] Primary Care [place] Agent: Christiana Fuchs Other Clinician: Referring Ulla Mckiernan: Treating Maddex Garlitz/Extender: Doran Stabler in Treatment: 15 Encounter Discharge Information Items Post  Procedure Vitals Discharge Condition: Stable Temperature (F): 98.1 Ambulatory Status: Ambulatory Pulse (bpm): 108 Discharge Destination: Home Respiratory Rate (breaths/min): 20 Transportation: Private Auto Blood Pressure (mmHg): 138/83 Schedule Follow-up Appointment: Yes Clinical Summary of Care: Provided on 08/12/2021 Form Type Recipient Paper Patient Patient Electronic Signature(s) Signed: 08/12/2021 5:57:57 PM By: Lorrin Jackson Entered By: Lorrin Jackson on 08/12/2021 11:19:41 -------------------------------------------------------------------------------- Lower Extremity Assessment Details Patient Name: Date of Service: LATARSHIA, JERSEY 08/12/2021 10:30 A M Medical Record Number: [id number] Patient Account Number: [id number] Date of Birth/Sex: Treating RN: [birth year] (60 y.o. [name] Primary Care Kandee Escalante: Christiana Fuchs Other Clinician: Referring Rourke Mcquitty: Treating Zyrion Coey/Extender: Doran Stabler in Treatment: 15 Edema Assessment Assessed: [Left: No] Patrice Paradise: Yes] Edema: [Left: N] [Right: o] Calf Left: Right: Point of Measurement: 27 cm From Medial Instep 37 cm Ankle Left: Right: Point of Measurement: 9 cm From Medial Instep 23 cm Vascular Assessment Pulses: Dorsalis Pedis Palpable: [Right:Yes] Electronic Signature(s) Signed: 08/12/2021 5:57:57 PM By: Lorrin Jackson Entered By: Lorrin Jackson on 08/12/2021 10:41:35 -------------------------------------------------------------------------------- Multi Wound Chart Details Patient Name: Date of Service: [name], [facility] 08/12/2021 10:30 A M Medical Record Number: [id number] Patient Account Number: [id number] Date of Birth/Sex: Treating RN: [birth year] (60 y.o. [name] Primary Care Lilit Cinelli: Christiana Fuchs Other Clinician: Referring Andreanna Mikolajczak: Treating Jatorian Renault/Extender: Doran Stabler in Treatment: 15 Vital Signs Height(in): Capillary Blood  Glucose(mg/dl): 120 Weight(lbs): Pulse(bpm): 108 Body Mass Index(BMI): Blood Pressure(mmHg): 138/83 Temperature(F): 98.1 Respiratory Rate(breaths/min): 20 Photos: [13:Right, Posterior T Great oe] [14:Right T Great oe] [N/A:N/A N/A] Wound Location: [13:Gradually Appeared] [14:Gradually Appeared] [N/A:N/A] Wounding Event: [13:Diabetic Wound/Ulcer of the Lower] [14:Diabetic Wound/Ulcer of the Lower] [N/A:N/A] Primary Etiology: [13:Extremity Anemia, Asthma, Hypertension, Type Anemia, Asthma, Hypertension, Type N/A] [14:Extremity] Comorbid History: [13:II Diabetes, Osteomyelitis, Neuropathy, Confinement Anxiety 04/05/2021] [14:II Diabetes, Osteomyelitis, Neuropathy, Confinement Anxiety 07/12/2021] [N/A:N/A] Date Acquired: [13:15] [14:4] [N/A:N/A] Weeks of Treatment: [13:Open] [14:Healed - Epithelialized] [N/A:N/A] Wound Status: [13:0.2x0.7x0.3] [14:0x0x0] [N/A:N/A] Measurements L x W x D (cm) [13:0.11] [  14:0] [N/A:N/A] A (cm) : rea [13:0.033] [14:0] [N/A:N/A] Volume (cm) : [13:-134.00%] [14:100.00%] [N/A:N/A] % Reduction in A [13:rea: -135.70%] [14:100.00%] [N/A:N/A] % Reduction in Volume: [13:Grade 2] [14:Grade 1] [N/A:N/A] Classification: [13:Medium] [14:Medium] [N/A:N/A] Exudate A mount: [13:Serosanguineous] [14:Serosanguineous] [N/A:N/A] Exudate Type: [13:red, brown] [14:red, brown] [N/A:N/A] Exudate Color: [13:Well defined, not attached] [14:Distinct, outline attached] [N/A:N/A] Wound Margin: [13:Large (67-100%)] [14:Large (67-100%)] [N/A:N/A] Granulation A mount: [13:Pink] [14:Pink] [N/A:N/A] Granulation Quality: [13:None Present (0%)] [14:None Present (0%)] [N/A:N/A] Necrotic A mount: [13:Fat Layer (Subcutaneous Tissue): Yes Fat Layer (Subcutaneous Tissue): Yes N/A] Exposed Structures: [13:Fascia: No Tendon: No Muscle: No Joint: No Bone: No Medium (34-66%)] [14:Fascia: No Tendon: No Muscle: No Joint: No Bone: No Large (67-100%)] [N/A:N/A] Epithelialization: [13:Debridement -  Excisional] [14:N/A] [N/A:N/A] Debridement: Pre-procedure Verification/Time Out 11:00 [14:N/A] [N/A:N/A] Taken: [13:Callus, Subcutaneous] [14:N/A] [N/A:N/A] Tissue Debrided: [13:Skin/Subcutaneous Tissue] [14:N/A] [N/A:N/A] Level: [13:0.14] [14:N/A] [N/A:N/A] Debridement A (sq cm): [13:rea Blade, Curette] [14:N/A] [N/A:N/A] Instrument: [13:Swab] [14:N/A] [N/A:N/A] Specimen: [13:1] [14:N/A] [N/A:N/A] Number of Specimens Taken: [13:Minimum] [14:N/A] [N/A:N/A] Bleeding: [13:Pressure] [14:N/A] [N/A:N/A] Hemostasis A chieved: [13:Procedure was tolerated well] [14:N/A] [N/A:N/A] Debridement Treatment Response: [13:0.2x0.7x0.3] [14:N/A] [N/A:N/A] Post Debridement Measurements L x W x D (cm) [13:0.033] [14:N/A] [N/A:N/A] Post Debridement Volume: (cm) [13:Calloused periwound] [14:N/A] [N/A:N/A] Assessment Notes: [13:Debridement] [14:N/A] [N/A:N/A] Treatment Notes Wound #13 (Toe Great) Wound Laterality: Right, Posterior Cleanser Wound Cleanser Discharge Instruction: Cleanse the wound with wound cleanser prior to applying a clean dressing using gauze sponges, not tissue or cotton balls. Peri-Wound Care Topical Mupirocin Ointment Discharge Instruction: In Office Only Today Primary Dressing KerraCel Ag Gelling Fiber Dressing, 4x5 in (silver alginate) Discharge Instruction: Apply silver alginate to wound bed as instructed Secondary Dressing Woven Gauze Sponge, Non-Sterile 4x4 in Discharge Instruction: Apply over primary dressing as directed. Optifoam Non-Adhesive Dressing, 4x4 in Discharge Instruction: **Foam donut***Apply over primary dressing as directed. Secured With Conforming Stretch Gauze Bandage, Sterile 2x75 (in/in) Discharge Instruction: Secure with stretch gauze as directed. 63M Medipore H Soft Cloth Surgical T ape, 2x2 (in/yd) Discharge Instruction: Secure dressing with tape as directed. Compression Wrap Compression Stockings Add-Ons Electronic Signature(s) Signed: 08/12/2021  12:07:18 PM By: Kalman Shan DO Signed: 08/12/2021 4:34:42 PM By: Rhae Hammock RN Entered By: Kalman Shan on 08/12/2021 12:00:48 -------------------------------------------------------------------------------- Multi-Disciplinary Care Plan Details Patient Name: Date of Service: DAO, MEARNS 08/12/2021 10:30 A M Medical Record Number: [id number] Patient Account Number: [id number] Date of Birth/Sex: Treating RN: Sep 07, 1961 (60 y.o. [name] Primary Care Karthikeya Funke: Christiana Fuchs Other Clinician: Referring Savien Mamula: Treating Modell Fendrick/Extender: Doran Stabler in Treatment: 15 Active Inactive Nutrition Nursing Diagnoses: Impaired glucose control: actual or potential Potential for alteratiion in Nutrition/Potential for imbalanced nutrition Goals: Patient/caregiver agrees to and verbalizes understanding of need to obtain nutritional consultation Date Initiated: 05/06/2021 Date Inactivated: 06/04/2021 Target Resolution Date: 05/27/2021 Goal Status: Met Patient/caregiver will maintain therapeutic glucose control Date Initiated: 05/06/2021 Target Resolution Date: 08/20/2021 Goal Status: Active Interventions: Assess HgA1c results as ordered upon admission and as needed Provide education on elevated blood sugars and impact on wound healing Provide education on nutrition Treatment Activities: Patient referred to Primary Care Physician for further nutritional evaluation : 05/06/2021 Notes: Wound/Skin Impairment Nursing Diagnoses: Knowledge deficit related to ulceration/compromised skin integrity Goals: Patient/caregiver will verbalize understanding of skin care regimen Date Initiated: 05/06/2021 Target Resolution Date: 08/20/2021 Goal Status: Active Interventions: Assess patient/caregiver ability to obtain necessary supplies Assess patient/caregiver ability to perform ulcer/skin care regimen upon admission and as needed Provide education on ulcer  and skin care Treatment  Activities: Skin care regimen initiated : 05/06/2021 Topical wound management initiated : 05/06/2021 Notes: Electronic Signature(s) Signed: 08/12/2021 5:57:57 PM By: Lorrin Jackson Entered By: Lorrin Jackson on 08/12/2021 10:38:39 -------------------------------------------------------------------------------- Pain Assessment Details Patient Name: Date of Service: SUESAN, MOHRMANN 08/12/2021 10:30 A M Medical Record Number: [id number] Patient Account Number: [id number] Date of Birth/Sex: Treating RN: 1961/06/29 (59 y.o. [name] Primary Care Meghanne Pletz: Christiana Fuchs Other Clinician: Referring Cameryn Chrisley: Treating Marvon Shillingburg/Extender: Doran Stabler in Treatment: 15 Active Problems Location of Pain Severity and Description of Pain Patient Has Paino No Site Locations Pain Management and Medication Current Pain Management: Electronic Signature(s) Signed: 08/12/2021 11:03:59 AM By: Sandre Kitty Signed: 08/12/2021 4:34:42 PM By: Rhae Hammock RN Entered By: Sandre Kitty on 08/12/2021 10:36:44 -------------------------------------------------------------------------------- Patient/Caregiver Education Details Patient Name: Date of Service: [name], Daisia 9/15/2022andnbsp10:30 [place] Record Number: [id number] Patient Account Number: [id number] Date of Birth/Gender: Treating RN: 08-24-1961 (60 y.o. [name] Primary Care Physician: Christiana Fuchs Other Clinician: Referring Physician: Treating Physician/Extender: Doran Stabler in Treatment: 15 Education Assessment Education Provided To: Patient Education Topics Provided Elevated Blood Sugar/ Impact on Healing: Methods: Explain/Verbal Responses: State content correctly Wound/Skin Impairment: Methods: Explain/Verbal, Printed Responses: State content correctly Electronic Signature(s) Signed: 08/12/2021 5:57:57 PM By: Lorrin Jackson Entered By: Lorrin Jackson on 08/12/2021 10:40:14 -------------------------------------------------------------------------------- Wound Assessment Details Patient Name: Date of Service: SHALIA, BARTKO 08/12/2021 10:30 A M Medical Record Number: [id number] Patient Account Number: [id number] Date of Birth/Sex: Treating RN: 1961/03/25 (60 y.o. [name], [name] Primary Care Nance Mccombs: Christiana Fuchs Other Clinician: Referring Hope Brandenburger: Treating Curt Oatis/Extender: Doran Stabler in Treatment: 15 Wound Status Wound Number: 13 Primary Diabetic Wound/Ulcer of the Lower Extremity Etiology: Wound Location: Right, Posterior T Great oe Wound Open Wounding Event: Gradually Appeared Status: Date Acquired: 04/05/2021 Comorbid Anemia, Asthma, Hypertension, Type II Diabetes, Osteomyelitis, Weeks Of Treatment: 15 History: Neuropathy, Confinement Anxiety Clustered Wound: No Photos Wound Measurements Length: (cm) 0.2 Width: (cm) 0.7 Depth: (cm) 0.3 Area: (cm) 0.11 Volume: (cm) 0.033 % Reduction in Area: -134% % Reduction in Volume: -135.7% Epithelialization: Medium (34-66%) Tunneling: No Undermining: No Wound Description Classification: Grade 2 Wound Margin: Well defined, not attached Exudate Amount: Medium Exudate Type: Serosanguineous Exudate Color: red, brown Foul Odor After Cleansing: No Slough/Fibrino No Wound Bed Granulation Amount: Large (67-100%) Exposed Structure Granulation Quality: Pink Fascia Exposed: No Necrotic Amount: None Present (0%) Fat Layer (Subcutaneous Tissue) Exposed: Yes Tendon Exposed: No Muscle Exposed: No Joint Exposed: No Bone Exposed: No Assessment Notes Calloused periwound Treatment Notes Wound #13 (Toe Great) Wound Laterality: Right, Posterior Cleanser Wound Cleanser Discharge Instruction: Cleanse the wound with wound cleanser prior to applying a clean dressing using gauze sponges, not tissue or cotton  balls. Peri-Wound Care Topical Mupirocin Ointment Discharge Instruction: In Office Only Today Primary Dressing KerraCel Ag Gelling Fiber Dressing, 4x5 in (silver alginate) Discharge Instruction: Apply silver alginate to wound bed as instructed Secondary Dressing Woven Gauze Sponge, Non-Sterile 4x4 in Discharge Instruction: Apply over primary dressing as directed. Optifoam Non-Adhesive Dressing, 4x4 in Discharge Instruction: **Foam donut***Apply over primary dressing as directed. Secured With Conforming Stretch Gauze Bandage, Sterile 2x75 (in/in) Discharge Instruction: Secure with stretch gauze as directed. 76M Medipore H Soft Cloth Surgical T ape, 2x2 (in/yd) Discharge Instruction: Secure dressing with tape as directed. Compression Wrap Compression Stockings Add-Ons Electronic Signature(s) Signed: 08/12/2021 4:34:42 PM By: Rhae Hammock RN Signed: 08/12/2021 5:57:57 PM By: Lorrin Jackson Entered By: Lorrin Jackson  on 08/12/2021 10:46:04 -------------------------------------------------------------------------------- Wound Assessment Details Patient Name: Date of Service: ANEDRA, PENAFIEL 08/12/2021 10:30 A M Medical Record Number: [id number] Patient Account Number: [id number] Date of Birth/Sex: Treating RN: Oct 19, 1961 (60 y.o. [name] Primary Care Fredrick Geoghegan: Christiana Fuchs Other Clinician: Referring Nefi Musich: Treating Geni Skorupski/Extender: Doran Stabler in Treatment: 15 Wound Status Wound Number: 14 Primary Diabetic Wound/Ulcer of the Lower Extremity Etiology: Wound Location: Right T Great oe Wound Healed - Epithelialized Wounding Event: Gradually Appeared Status: Date Acquired: 07/12/2021 Comorbid Anemia, Asthma, Hypertension, Type II Diabetes, Osteomyelitis, Weeks Of Treatment: 4 History: Neuropathy, Confinement Anxiety Clustered Wound: No Photos Wound Measurements Length: (cm) Width: (cm) Depth: (cm) Area: (cm) Volume:  (cm) 0 % Reduction in Area: 100% 0 % Reduction in Volume: 100% 0 Epithelialization: Large (67-100%) 0 0 Wound Description Classification: Grade 1 Wound Margin: Distinct, outline attached Exudate Amount: Medium Exudate Type: Serosanguineous Exudate Color: red, brown Foul Odor After Cleansing: No Slough/Fibrino No Wound Bed Granulation Amount: Large (67-100%) Exposed Structure Granulation Quality: Pink Fascia Exposed: No Necrotic Amount: None Present (0%) Fat Layer (Subcutaneous Tissue) Exposed: Yes Tendon Exposed: No Muscle Exposed: No Joint Exposed: No Bone Exposed: No Electronic Signature(s) Signed: 08/12/2021 5:57:57 PM By: Lorrin Jackson Entered By: Lorrin Jackson on 08/12/2021 10:44:41 -------------------------------------------------------------------------------- Vitals Details Patient Name: Date of Service: [name], [place] 08/12/2021 10:30 A M Medical Record Number: [id number] Patient Account Number: [id number] Date of Birth/Sex: Treating RN: September 13, 1961 (60 y.o. [name], [name] Primary Care Langley Ingalls: Christiana Fuchs Other Clinician: Referring Kieley Akter: Treating Darrol Brandenburg/Extender: Doran Stabler in Treatment: 15 Vital Signs Time Taken: 10:35 Temperature (F): 98.1 Pulse (bpm): 108 Respiratory Rate (breaths/min): 20 Blood Pressure (mmHg): 138/83 Capillary Blood Glucose (mg/dl): 120 Reference Range: 80 - 120 mg / dl Electronic Signature(s) Signed: 08/12/2021 11:03:59 AM By: Sandre Kitty Entered By: Sandre Kitty on 08/12/2021 10:36:35

## 2021-08-12 NOTE — Progress Notes (Signed)
Debra, Rivera ([id number]) Visit Report for 08/12/2021 Chief Complaint Document Details Patient Name: Date of Service: [name], [name] 08/12/2021 10:30 A M Medical Record Number: [id number] Patient Account Number: [id number] Date of Birth/Sex: Treating RN: 1961/01/14 (60 y.o. [name] Primary Care Provider: Christiana Fuchs Other Clinician: Referring Provider: Treating Provider/Extender: Doran Stabler in Treatment: 15 Information Obtained from: Patient Chief Complaint wound to the distal portion of the right great toe Electronic Signature(s) Signed: 08/12/2021 12:07:18 PM By: Kalman Shan DO Entered By: Kalman Shan on 08/12/2021 12:01:07 -------------------------------------------------------------------------------- Debridement Details Patient Name: Date of Service: [name], [name] 08/12/2021 10:30 A M Medical Record Number: [id number] Patient Account Number: [id number] Date of Birth/Sex: Treating RN: November 28, 1961 (60 y.o. [name] Primary Care Provider: Christiana Fuchs Other Clinician: Referring Provider: Treating Provider/Extender: Doran Stabler in Treatment: 15 Debridement Performed for Assessment: Wound #13 Right,Posterior T Great oe Performed By: Physician Kalman Shan, DO Debridement Type: Debridement Severity of Tissue Pre Debridement: Fat layer exposed Level of Consciousness (Pre-procedure): Awake and Alert Pre-procedure Verification/Time Out Yes - 11:00 Taken: Start Time: 11:01 T Area Debrided (L x W): otal 0.2 (cm) x 0.7 (cm) = 0.14 (cm) Tissue and other material debrided: Non-Viable, Callus, Subcutaneous Level: Skin/Subcutaneous Tissue Debridement Description: Excisional Instrument: Blade, Curette Specimen: Swab, Number of Specimens T aken: 1 Bleeding: Minimum Hemostasis Achieved: Pressure End Time: 11:07 Response to Treatment: Procedure was tolerated well Level of Consciousness  (Post- Awake and Alert procedure): Post Debridement Measurements of Total Wound Length: (cm) 0.2 Width: (cm) 0.7 Depth: (cm) 0.3 Volume: (cm) 0.033 Character of Wound/Ulcer Post Debridement: Stable Severity of Tissue Post Debridement: Fat layer exposed Post Procedure Diagnosis Same as Pre-procedure Electronic Signature(s) Signed: 08/12/2021 12:07:18 PM By: Kalman Shan DO Signed: 08/12/2021 5:57:57 PM By: Lorrin Jackson Entered By: Lorrin Jackson on 08/12/2021 11:13:52 -------------------------------------------------------------------------------- HPI Details Patient Name: Date of Service: [name], [name] 08/12/2021 10:30 A M Medical Record Number: [id number] Patient Account Number: [id number] Date of Birth/Sex: Treating RN: 07-13-1961 (60 y.o. [name] Primary Care Provider: Christiana Fuchs Other Clinician: Referring Provider: Treating Provider/Extender: Doran Stabler in Treatment: 15 History of Present Illness Location: notice some discharge and callus on the right big toe Quality: Patient reports experiencing a dull pain to affected area(s). Severity: Patient states wound(s) are getting worse. Duration: Patient has had the wound for < 2 weeks prior to presenting for treatment Timing: Pain in wound is Intermittent (comes and goes Context: The wound occurred when the patient was cutting her toenails and noticed some discharge from this area and was very concerned about it. Modifying Factors: Patient wound(s)/ulcer(s) are improving due to:he has seen her PCP who put her on Bactrim and Keflex antibiotic ssociated Signs and Symptoms: Patient reports having decrease discharge she started on her antibiotic. A HPI Description: [name] is a 60 year old female with a past medical history of uncontrolled type 2 diabetes on insulin, osteomyelitis with amputations of 1st and 2nd toes of her left foot that presents to clinic for wound care  to the right great toe wound. She states she noticed the wound 3 weeks ago. Not quite sure how it started. She has been using silver alginate to the area. She was recently hospitalized from a clinic visit in the internal medicine clinic for this issue. While hospitalized she had an MRI of the right foot that showed likely early acute osteomyelitis with possible septic arthritis. She was discharged on ciprofloxacin  for 28 days. She currently does not deny pain increased warmth or erythema to the area. She has some drainage but not purulent. 6/9; patient presents for 1 week follow-up. She has no complaints today and denies signs of infection. She missed her infectious disease appointment yesterday but has it rescheduled for the 21st. She has been using silver alginate to the wound. 6/23; patient presents for 2-week follow-up. She followed up with Dr. Megan Salon, infectious disease 2 days ago. She had some blood work done and was told to finish ciprofloxacin. She is having a follow-up virtual encounter next week. There has been an increase in callus development. She is unable to use her offloading shoe due to having to walk for long periods due to needing public transportation. She continues to use silver alginate to the wound. She denies signs of infection. 7/8; patient presents for follow-up. She missed her appointment last week. She reports increased callus to the surrounding wound area. She continues to use silver alginate. She denies signs of infection. 7/14; patient presents for 1 week follow-up. She has been using silver alginate to the wound bed. She has no issues or complaints today. She denies signs of infection. 7/28; patient presents for 2-week follow-up. She has been using silver alginate to the wound bed. She has been moving to a new location over the past week. She has no issues or complaints today. She denies signs of infection. 8/18; patient presents for follow-up. She was recently  hospitalized for COVID and has not been able to follow-up for wound care. She was last seen 3 weeks ago. She reports receiving wound care while hospitalized. Since discharge of the hospital she is reported more drainage from the wound site. She denies signs of infection. 8/25; patient presents for follow-up. She has no issues or complaints today. She has been using silver alginate daily to the wound bed. She denies signs of infection. 9/15; patient presents for follow-up. She missed her last clinic appointment. She reports using silver alginate to the wound bed. She denies signs of infection. Due to transportation she has to walk for long periods of time. It is hard for the patient to offload this area well. Electronic Signature(s) Signed: 08/12/2021 12:07:18 PM By: Kalman Shan DO Entered By: Kalman Shan on 08/12/2021 12:02:21 -------------------------------------------------------------------------------- Physical Exam Details Patient Name: Date of Service: [name], [name] 08/12/2021 10:30 A M Medical Record Number: [id number] Patient Account Number: [id number] Date of Birth/Sex: Treating RN: 06-Sep-1961 (60 y.o. [name] Primary Care Provider: Christiana Fuchs Other Clinician: Referring Provider: Treating Provider/Extender: Doran Stabler in Treatment: 15 Constitutional respirations regular, non-labored and within target range for patient.Marland Kitchen Psychiatric pleasant and cooperative. Notes Right great toe: Posterior aspect with open wound. Granulation tissue and nonviable tissue present. More circumferential callus noted. The lateral wound has closed. No signs of infection. Electronic Signature(s) Signed: 08/12/2021 12:07:18 PM By: Kalman Shan DO Entered By: Kalman Shan on 08/12/2021 12:03:47 -------------------------------------------------------------------------------- Physician Orders Details Patient Name: Date of Service: [name], [name] 08/12/2021 10:30 A M Medical Record Number: [id number] Patient Account Number: [id number] Date of Birth/Sex: Treating RN: 08-16-1961 (60 y.o. 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 - with Dr. Heber Mercer Return A Bathing/ Shower/ Hygiene May shower with protection but do not get wound dressing(s) wet. - May use cast wrap for protection Edema Control - Lymphedema / SCD / Other Elevate legs to the level of the heart or above for 30 minutes daily and/or when sitting, a frequency of: - 3-4 times throughout the day. Avoid standing for long periods of time. Off-Loading Wedge shoe to: - Patient to wear while standing and walking. Wound Treatment Wound #13 - T Great oe Wound Laterality: Right, Posterior Cleanser: Wound Cleanser (Generic) 1 x Per Day/15 Days Discharge Instructions: Cleanse the wound with wound cleanser prior to applying a clean dressing using gauze sponges, not tissue or cotton balls. Topical: Mupirocin Ointment 1 x Per Day/15 Days Discharge Instructions: In Office Only Today Prim Dressing: KerraCel Ag Gelling Fiber Dressing, 4x5 in (silver alginate) (Generic) 1 x Per Day/15 Days ary Discharge Instructions: Apply silver alginate to wound bed as instructed Secondary Dressing: Woven Gauze Sponge, Non-Sterile 4x4 in (Generic) 1 x Per Day/15 Days Discharge Instructions: Apply over primary dressing as directed. Secondary Dressing: Optifoam Non-Adhesive Dressing, 4x4 in (Generic) 1 x  Per Day/15 Days Discharge Instructions: **Foam donut***Apply over primary dressing as directed. Secured With: Child psychotherapist, Sterile 2x75 (in/in) (Generic) 1 x Per Day/15 Days Discharge Instructions: Secure with stretch gauze as directed. Secured With: 1M Medipore H Soft Cloth Surgical Tape, 2x2 (in/yd) (Generic) 1 x Per Day/15 Days Discharge Instructions: Secure dressing with tape as directed. Laboratory erobe culture (MICRO) Bacteria identified in Unspecified specimen by A LOINC Code: [id number] Convenience Name: Areobic culture-specimen not specified Electronic Signature(s) Signed: 08/12/2021 12:07:18 PM By: Kalman Shan DO Entered By: Kalman Shan on 08/12/2021 12:04:07 -------------------------------------------------------------------------------- Problem List Details Patient Name: Date of Service: [name], [name] 08/12/2021 10:30 A M Medical Record Number: [id number] Patient Account Number: [id number] Date of Birth/Sex: Treating RN: 1961/05/29 (60 y.o. 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 [name] Primary Care Provider: Christiana Fuchs Other Clinician: Referring Provider: Treating Provider/Extender: Doran Stabler in Treatment: 15 Subjective Chief Complaint Information obtained from Patient wound to the distal portion of the right great toe History of Present Illness (HPI) The following HPI elements were documented for the patient's wound: Location: notice some discharge and callus on the right big toe Quality: Patient reports experiencing a dull pain to affected area(s). Severity: Patient states wound(s) are getting worse. Duration: Patient has had the wound for < 2 weeks prior to presenting for treatment Timing: Pain in wound is Intermittent (comes and goes Context: The wound occurred when the patient was cutting her toenails and noticed some  discharge from this area and was very concerned about it. Modifying Factors: Patient wound(s)/ulcer(s) are improving due to:he has seen her PCP who put her on Bactrim and Keflex antibiotic Associated Signs and Symptoms: Patient reports having decrease discharge she started on her antibiotic. Ms. Sakile Filar is a 60 year old female with a past medical history of uncontrolled type 2 diabetes on insulin, osteomyelitis with amputations of 1st and 2nd toes of her left foot that presents to clinic for wound care to the right great toe wound. She states she noticed the wound 3 weeks ago. Not quite sure how it started. She has been using silver alginate to the area. She was recently hospitalized from a clinic visit in the internal medicine clinic for this issue. While hospitalized she had an MRI of the right foot that showed likely early acute osteomyelitis with possible septic arthritis. She was discharged on ciprofloxacin for 28 days. She currently does not deny pain increased  warmth or erythema to the area. She has some drainage but not purulent. 6/9; patient presents for 1 week follow-up. She has no complaints today and denies signs of infection. She missed her infectious disease appointment yesterday but has it rescheduled for the 21st. She has been using silver alginate to the wound. 6/23; patient presents for 2-week follow-up. She followed up with Dr. Megan Salon, infectious disease 2 days ago. She had some blood work done and was told to finish ciprofloxacin. She is having a follow-up virtual encounter next week. There has been an increase in callus development. She is unable to use her offloading shoe due to having to walk for long periods due to needing public transportation. She continues to use silver alginate to the wound. She denies signs of infection. 7/8; patient presents for follow-up. She missed her appointment last week. She reports increased callus to the surrounding wound area. She continues to use silver alginate. She denies signs of infection. 7/14; patient presents for 1 week follow-up. She has been using silver alginate to the wound bed. She has no issues or complaints today. She denies signs of infection. 7/28; patient presents for 2-week follow-up. She has been using silver alginate to the wound bed. She has been moving to a new location over the past week. She has no issues or complaints today. She denies signs of infection. 8/18; patient presents for follow-up. She was recently hospitalized for COVID and has not been able to follow-up for wound care. She was last seen 3 weeks ago. She reports receiving wound care while hospitalized. Since discharge of the hospital she is reported more drainage from the wound site. She denies signs of infection. 8/25; patient presents for follow-up. She has no issues or complaints today. She has been using silver alginate daily to the wound bed. She denies signs of infection. 9/15; patient presents for follow-up.  She missed her last clinic appointment. She reports using silver alginate to the wound bed. She denies signs of infection. Due to transportation she has to walk for long periods of time. It is hard for the patient to offload this area well. Patient History Information obtained from Patient. Family History Diabetes - Father,Child, Hypertension - Mother,Father, No family history of Cancer, Heart Disease, Hereditary Spherocytosis, Kidney Disease, Lung Disease, Seizures, Stroke, Thyroid Problems, Tuberculosis. Social History Never smoker, Marital Status - Divorced, Alcohol Use - Never, Drug Use - No History, Caffeine Use - Rarely - soda. Medical History Eyes Denies history of Cataracts, Glaucoma, Optic Neuritis Ear/Nose/Mouth/Throat Denies history of Chronic sinus  problems/congestion, Middle ear problems Hematologic/Lymphatic Patient has history of Anemia Denies history of Hemophilia, Human Immunodeficiency Virus, Lymphedema, Sickle Cell Disease Respiratory Patient has history of Asthma Denies history of Aspiration, Chronic Obstructive Pulmonary Disease (COPD), Pneumothorax, Sleep Apnea, Tuberculosis Cardiovascular Patient has history of Hypertension Denies history of Angina, Arrhythmia, Congestive Heart Failure, Coronary Artery Disease, Deep Vein Thrombosis, Hypotension, Myocardial Infarction, Peripheral Arterial Disease, Peripheral Venous Disease, Phlebitis, Vasculitis Gastrointestinal Denies history of Cirrhosis , Colitis, Crohnoos, Hepatitis A, Hepatitis B, Hepatitis C Endocrine Patient has history of Type II Diabetes Denies history of Type I Diabetes Genitourinary Denies history of End Stage Renal Disease Immunological Denies history of Lupus Erythematosus, Raynaudoos, Scleroderma Integumentary (Skin) Denies history of History of Burn Musculoskeletal Patient has history of Osteomyelitis - hx left great toe Denies history of Gout, Rheumatoid Arthritis,  Osteoarthritis Neurologic Patient has history of Neuropathy Denies history of Dementia, Quadriplegia, Paraplegia, Seizure Disorder Oncologic Denies history of Received Chemotherapy, Received Radiation Psychiatric Patient has history of Confinement Anxiety Denies history of Anorexia/bulimia Patient is treated with Insulin, Oral Agents. Blood sugar is tested. Blood sugar results noted at the following times: Lunch - 120-160. Hospitalization/Surgery History - left second toe amputation. - left great toe amputation. - c-section x4. Medical A Surgical History Notes nd Gastrointestinal GERD Objective Constitutional respirations regular, non-labored and within target range for patient.. Vitals Time Taken: 10:35 AM, Temperature: 98.1 F, Pulse: 108 bpm, Respiratory Rate: 20 breaths/min, Blood Pressure: 138/83 mmHg, Capillary Blood Glucose: 120 mg/dl. Psychiatric pleasant and cooperative. General Notes: Right great toe: Posterior aspect with open wound. Granulation tissue and nonviable tissue present. More circumferential callus noted. The lateral wound has closed. No signs of infection. Integumentary (Hair, Skin) Wound #13 status is Open. Original cause of wound was Gradually Appeared. The date acquired was: 04/05/2021. The wound has been in treatment 15 weeks. The wound is located on the [place]. The wound measures 0.2cm length x 0.7cm width x 0.3cm depth; 0.11cm^2 area and 0.033cm^3 volume. oe There is Fat Layer (Subcutaneous Tissue) exposed. There is no tunneling or undermining noted. There is a medium amount of serosanguineous drainage noted. The wound margin is well defined and not attached to the wound base. There is large (67-100%) pink granulation within the wound bed. There is no necrotic tissue within the wound bed. General Notes: Calloused periwound Wound #14 status is Healed - Epithelialized. Original cause of wound was Gradually Appeared. The date acquired was:  07/12/2021. The wound has been in treatment 4 weeks. The wound is located on the Right T Great. The wound measures 0cm length x 0cm width x 0cm depth; 0cm^2 area and 0cm^3 volume. oe There is Fat Layer (Subcutaneous Tissue) exposed. There is a medium amount of serosanguineous drainage noted. The wound margin is distinct with the outline attached to the wound base. There is large (67-100%) pink granulation within the wound bed. There is no necrotic tissue within the wound bed. Assessment Active Problems ICD-10 Type 2 diabetes mellitus with foot ulcer Other acute osteomyelitis, right ankle and foot Non-pressure chronic ulcer of other part of right foot with unspecified severity Type 2 diabetes mellitus with hyperglycemia Patient's wound has gotten larger since last clinic visit. No obvious signs of infection however I would like to see if there is any bioburden and we obtained a wound culture today. I debrided nonviable tissue. We discussed potentially using a soft cast and patient would like to think about this. Patient does not want to do a total contact cast  and this offers her another option that will allow her to be more ambulatory. Procedures Wound #13 Pre-procedure diagnosis of Wound #13 is a Diabetic Wound/Ulcer of the Lower Extremity located on the [place] .Severity of Tissue Pre oe Debridement is: Fat layer exposed. There was a Excisional Skin/Subcutaneous Tissue Debridement with a total area of 0.14 sq cm performed by Kalman Shan, DO. With the following instrument(s): Blade, and Curette to remove Non-Viable tissue/material. Material removed includes Callus and Subcutaneous Tissue and. 1 specimen was taken by a Swab and sent to the lab per facility protocol. A time out was conducted at 11:00, prior to the start of the procedure. A Minimum amount of bleeding was controlled with Pressure. The procedure was tolerated well. Post Debridement Measurements: 0.2cm length x  0.7cm width x 0.3cm depth; 0.033cm^3 volume. Character of Wound/Ulcer Post Debridement is stable. Severity of Tissue Post Debridement is: Fat layer exposed. Post procedure Diagnosis Wound #13: Same as Pre-Procedure Plan Follow-up Appointments: Return Appointment in 1 week. - with Dr. Heber Many Bathing/ Shower/ Hygiene: May shower with protection but do not get wound dressing(s) wet. - May use cast wrap for protection Edema Control - Lymphedema / SCD / Other: Elevate legs to the level of the heart or above for 30 minutes daily and/or when sitting, a frequency of: - 3-4 times throughout the day. Avoid standing for long periods of time. Off-Loading: Wedge shoe to: - Patient to wear while standing and walking. Laboratory ordered were: Areobic culture-specimen not specified WOUND #13: - T Great Wound Laterality: Right, Posterior oe Cleanser: Wound Cleanser (Generic) 1 x Per Day/15 Days Discharge Instructions: Cleanse the wound with wound cleanser prior to applying a clean dressing using gauze sponges, not tissue or cotton balls. Topical: Mupirocin Ointment 1 x Per Day/15 Days Discharge Instructions: In Office Only T oday Prim Dressing: KerraCel Ag Gelling Fiber Dressing, 4x5 in (silver alginate) (Generic) 1 x Per Day/15 Days ary Discharge Instructions: Apply silver alginate to wound bed as instructed Secondary Dressing: Woven Gauze Sponge, Non-Sterile 4x4 in (Generic) 1 x Per Day/15 Days Discharge Instructions: Apply over primary dressing as directed. Secondary Dressing: Optifoam Non-Adhesive Dressing, 4x4 in (Generic) 1 x Per Day/15 Days Discharge Instructions: **Foam donut***Apply over primary dressing as directed. Secured With: Child psychotherapist, Sterile 2x75 (in/in) (Generic) 1 x Per Day/15 Days Discharge Instructions: Secure with stretch gauze as directed. Secured With: 29M Medipore H Soft Cloth Surgical T ape, 2x2 (in/yd) (Generic) 1 x Per Day/15 Days Discharge  Instructions: Secure dressing with tape as directed. 1. Silver alginate 2. In office sharp debridement 3. Wound culture 4. Bactroban in office only 5. Follow-up in 1 week Electronic Signature(s) Signed: 08/12/2021 12:07:18 PM By: Kalman Shan DO Entered By: Kalman Shan on 08/12/2021 12:06:16 -------------------------------------------------------------------------------- HxROS Details Patient Name: Date of Service: [name], [name] 08/12/2021 10:30 A M Medical Record Number: [id number] Patient Account Number: [id number] Date of Birth/Sex: Treating RN: May 31, 1961 (60 y.o. [name] Primary Care Provider: Christiana Fuchs Other Clinician: Referring Provider: Treating Provider/Extender: Doran Stabler in Treatment: 15 Label Progress Note Print Version as History and Physical for this encounter Information Obtained From Patient Eyes Medical History: Negative for: Cataracts; Glaucoma; Optic Neuritis Ear/Nose/Mouth/Throat Medical History: Negative for: Chronic sinus problems/congestion; Middle ear problems Hematologic/Lymphatic Medical History: Positive for: Anemia Negative for: Hemophilia; Human Immunodeficiency Virus; Lymphedema; Sickle Cell Disease Respiratory Medical History: Positive for: Asthma Negative for: Aspiration; Chronic Obstructive Pulmonary Disease (COPD); Pneumothorax; Sleep Apnea; Tuberculosis Cardiovascular  Medical History: Positive for: Hypertension Negative for: Angina; Arrhythmia; Congestive Heart Failure; Coronary Artery Disease; Deep Vein Thrombosis; Hypotension; Myocardial Infarction; Peripheral Arterial Disease; Peripheral Venous Disease; Phlebitis; Vasculitis Gastrointestinal Medical History: Negative for: Cirrhosis ; Colitis; Crohns; Hepatitis A; Hepatitis B; Hepatitis C Past Medical History Notes: GERD Endocrine Medical History: Positive for: Type II Diabetes Negative for: Type I Diabetes Time with  diabetes: 79 years Treated with: Insulin, Oral agents Blood sugar tested every day: Yes Tested : 2 times per day Blood sugar testing results: Lunch: 120-160 Genitourinary Medical History: Negative for: End Stage Renal Disease Immunological Medical History: Negative for: Lupus Erythematosus; Raynauds; Scleroderma Integumentary (Skin) Medical History: Negative for: History of Burn Musculoskeletal Medical History: Positive for: Osteomyelitis - hx left great toe Negative for: Gout; Rheumatoid Arthritis; Osteoarthritis Neurologic Medical History: Positive for: Neuropathy Negative for: Dementia; Quadriplegia; Paraplegia; Seizure Disorder Oncologic Medical History: Negative for: Received Chemotherapy; Received Radiation Psychiatric Medical History: Positive for: Confinement Anxiety Negative for: Anorexia/bulimia Immunizations Pneumococcal Vaccine: Received Pneumococcal Vaccination: Yes Received Pneumococcal Vaccination On or After 60th Birthday: No Immunization Notes: pt. 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 c-section x4 Family and Social History Cancer: No; Diabetes: Yes - Father,Child; Heart Disease: No; Hereditary Spherocytosis: No; Hypertension: Yes - Mother,Father; Kidney Disease: No; Lung Disease: No; Seizures: No; Stroke: No; Thyroid Problems: No; Tuberculosis: No; Never smoker; Marital Status - Divorced; Alcohol Use: Never; Drug Use: No History; Caffeine Use: Rarely - soda; Financial Concerns: No; Food, Clothing or Shelter Needs: No; Support System Lacking: No; Transportation Concerns: No Electronic Signature(s) Signed: 08/12/2021 12:07:18 PM By: Kalman Shan DO Signed: 08/12/2021 4:34:42 PM By: Rhae Hammock RN Entered By: Kalman Shan on 08/12/2021  12:02:59 -------------------------------------------------------------------------------- SuperBill Details Patient Name: Date of Service: [name], [name] 08/12/2021 Medical Record Number: [id number] Patient Account Number: [id number] Date of Birth/Sex: Treating RN: 06-23-1961 (60 y.o. 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

## 2021-08-17 ENCOUNTER — Other Ambulatory Visit (HOSPITAL_COMMUNITY): Payer: Self-pay

## 2021-08-17 ENCOUNTER — Encounter (HOSPITAL_BASED_OUTPATIENT_CLINIC_OR_DEPARTMENT_OTHER): Payer: Self-pay | Admitting: Internal Medicine

## 2021-08-17 MED ORDER — GENTAMICIN SULFATE 0.1 % EX CREA
1.0000 "application " | TOPICAL_CREAM | Freq: Every day | CUTANEOUS | 0 refills | Status: DC
  Filled 2021-08-17: qty 15, 7d supply, fill #0

## 2021-08-17 MED ORDER — CEPHALEXIN 500 MG PO CAPS
500.0000 mg | ORAL_CAPSULE | Freq: Two times a day (BID) | ORAL | 0 refills | Status: AC
  Filled 2021-08-17: qty 14, 7d supply, fill #0

## 2021-08-18 LAB — AEROBIC/ANAEROBIC CULTURE W GRAM STAIN (SURGICAL/DEEP WOUND)

## 2021-08-19 ENCOUNTER — Other Ambulatory Visit: Payer: Self-pay

## 2021-08-19 ENCOUNTER — Other Ambulatory Visit (HOSPITAL_COMMUNITY): Payer: Self-pay

## 2021-08-19 ENCOUNTER — Encounter (HOSPITAL_BASED_OUTPATIENT_CLINIC_OR_DEPARTMENT_OTHER): Payer: Medicaid Other | Admitting: Internal Medicine

## 2021-08-19 DIAGNOSIS — L97519 Non-pressure chronic ulcer of other part of right foot with unspecified severity: Secondary | ICD-10-CM | POA: Diagnosis not present

## 2021-08-19 DIAGNOSIS — E11621 Type 2 diabetes mellitus with foot ulcer: Secondary | ICD-10-CM | POA: Diagnosis not present

## 2021-08-19 DIAGNOSIS — M86171 Other acute osteomyelitis, right ankle and foot: Secondary | ICD-10-CM

## 2021-08-19 DIAGNOSIS — E1165 Type 2 diabetes mellitus with hyperglycemia: Secondary | ICD-10-CM | POA: Diagnosis not present

## 2021-08-19 NOTE — Progress Notes (Signed)
[name], [name] ([id number]) Visit Report for 08/19/2021 Arrival Information Details Patient Name: Date of Service: [name], [name] 08/19/2021 10:45 A M Medical Record Number: [id number] Patient Account Number: [id number] Date of Birth/Sex: Treating RN: May 31, 1961 (60 y.o. [name], [name] Primary Rivera [name]: [name] Rivera Other Clinician: Referring [name]: Treating [name]/Extender: [name] in Treatment: 16 Visit Information History Since Last Visit Added or deleted any medications: No Patient Arrived: Ambulatory Any new allergies or adverse reactions: No Arrival Time: 10:27 Had a fall or experienced change in No Accompanied By: self activities of daily living that may affect Transfer Assistance: None risk of falls: Patient Identification Verified: Yes Signs or symptoms of abuse/neglect since last visito No Secondary Verification Process Completed: Yes Hospitalized since last visit: No Patient Requires Transmission-Based Precautions: No Implantable device outside of the clinic excluding No Patient Has Alerts: Yes cellular tissue based products placed in the center Patient Alerts: ABI R=1.18 since last visit: ABI L=1.1 Has Dressing in Place as Prescribed: Yes Pain Present Now: No Electronic Signature(s) Signed: 08/19/2021 5:20:32 PM By: Baruch Gouty RN, BSN Entered By: Baruch Gouty on 08/19/2021 10:38:29 -------------------------------------------------------------------------------- Clinic Level of Rivera Assessment Details Patient Name: Date of Service: Debra, Rivera 08/19/2021 10:45 A M Medical Record Number: [id number] Patient Account Number: [id number] Date of Birth/Sex: Treating RN: Sep 11, 1961 (60 y.o. [name] [name]: [name] Rivera Other Clinician: Referring [name]: Treating [name]/Extender: [name] in Treatment: 16 Clinic Level of Rivera Assessment Items TOOL 4  Quantity Score '[]'  - 0 Use when only an EandM is performed on FOLLOW-UP visit ASSESSMENTS - Nursing Assessment / Reassessment X- 1 10 Reassessment of Co-morbidities (includes updates in patient status) X- 1 5 Reassessment of Adherence to Treatment Plan ASSESSMENTS - Wound and Skin A ssessment / Reassessment '[]'  - 0 Simple Wound Assessment / Reassessment - one wound X- 2 5 Complex Wound Assessment / Reassessment - multiple wounds '[]'  - 0 Dermatologic / Skin Assessment (not related to wound area) ASSESSMENTS - Focused Assessment '[]'  - 0 Circumferential Edema Measurements - multi extremities '[]'  - 0 Nutritional Assessment / Counseling / Intervention X- 1 5 Lower Extremity Assessment (monofilament, tuning fork, pulses) '[]'  - 0 Peripheral Arterial Disease Assessment (using hand held doppler) ASSESSMENTS - Ostomy and/or Continence Assessment and Rivera '[]'  - 0 Incontinence Assessment and Management '[]'  - 0 Ostomy Rivera Assessment and Management (repouching, etc.) PROCESS - Coordination of Rivera X - Simple Patient / Family Education for ongoing Rivera 1 15 '[]'  - 0 Complex (extensive) Patient / Family Education for ongoing Rivera X- 1 10 Staff obtains Programmer, systems, Records, T Results / Process Orders est '[]'  - 0 Staff telephones HHA, Nursing Homes / Clarify orders / etc '[]'  - 0 Routine Transfer to another Facility (non-emergent condition) '[]'  - 0 Routine Hospital Admission (non-emergent condition) '[]'  - 0 New Admissions / Biomedical engineer / Ordering NPWT Apligraf, etc. , '[]'  - 0 Emergency Hospital Admission (emergent condition) X- 1 10 Simple Discharge Coordination '[]'  - 0 Complex (extensive) Discharge Coordination PROCESS - Special Needs '[]'  - 0 Pediatric / Minor Patient Management '[]'  - 0 Isolation Patient Management '[]'  - 0 Hearing / Language / Visual special needs '[]'  - 0 Assessment of Community assistance (transportation, D/C planning, etc.) '[]'  - 0 Additional assistance / Altered  mentation '[]'  - 0 Support Surface(s) Assessment (bed, cushion, seat, etc.) INTERVENTIONS - Wound Cleansing / Measurement '[]'  - 0 Simple Wound Cleansing - one wound X- 2 5 Complex  Wound Cleansing - multiple wounds X- 1 5 Wound Imaging (photographs - any number of wounds) '[]'  - 0 Wound Tracing (instead of photographs) '[]'  - 0 Simple Wound Measurement - one wound X- 2 5 Complex Wound Measurement - multiple wounds INTERVENTIONS - Wound Dressings X - Small Wound Dressing one or multiple wounds 1 10 '[]'  - 0 Medium Wound Dressing one or multiple wounds '[]'  - 0 Large Wound Dressing one or multiple wounds X- 1 5 Application of Medications - topical '[]'  - 0 Application of Medications - injection INTERVENTIONS - Miscellaneous '[]'  - 0 External ear exam '[]'  - 0 Specimen Collection (cultures, biopsies, blood, body fluids, etc.) '[]'  - 0 Specimen(s) / Culture(s) sent or taken to Lab for analysis '[]'  - 0 Patient Transfer (multiple staff / Civil Service fast streamer / Similar devices) '[]'  - 0 Simple Staple / Suture removal (25 or less) '[]'  - 0 Complex Staple / Suture removal (26 or more) '[]'  - 0 Hypo / Hyperglycemic Management (close monitor of Blood Glucose) '[]'  - 0 Ankle / Brachial Index (ABI) - do not check if billed separately X- 1 5 Vital Signs Has the patient been seen at the hospital within the last three years: Yes Total Score: 110 Level Of Rivera: New/Established - Level 3 Electronic Signature(s) Signed: 08/19/2021 5:20:32 PM By: Baruch Gouty RN, BSN Entered By: Baruch Gouty on 08/19/2021 11:08:18 -------------------------------------------------------------------------------- Encounter Discharge Information Details Patient Name: Date of Service: [name], [name] 08/19/2021 10:45 A M Medical Record Number: [id number] Patient Account Number: [id number] Date of Birth/Sex: Treating RN: [birth year] (60 y.o. [name] [name]: [name] Rivera Other Clinician: Referring  [name]: Treating [name]/Extender: [name] in Treatment: 16 Encounter Discharge Information Items Discharge Condition: Stable Ambulatory Status: Ambulatory Discharge Destination: Home Transportation: Private Auto Accompanied By: self Schedule Follow-up Appointment: Yes Clinical Summary of Rivera: Patient Declined Electronic Signature(s) Signed: 08/19/2021 5:20:32 PM By: Baruch Gouty RN, BSN Entered By: Baruch Gouty on 08/19/2021 11:20:31 -------------------------------------------------------------------------------- Lower Extremity Assessment Details Patient Name: Date of Service: [name], [name] 08/19/2021 10:45 A M Medical Record Number: [id number] Patient Account Number: [id number] Date of Birth/Sex: Treating RN: Jun 20, 1961 (60 y.o. [name] Maiko Salais: [name] Rivera Other Clinician: Referring Merland Holness: Treating Malary Aylesworth/Extender: [name] in Treatment: 16 Edema Assessment Assessed: [Left: No] [Right: No] Edema: [Left: N] [Right: o] Calf Left: Right: Point of Measurement: 27 cm From Medial Instep 37 cm Ankle Left: Right: Point of Measurement: 9 cm From Medial Instep 23 cm Vascular Assessment Pulses: Dorsalis Pedis Palpable: [Right:Yes] Electronic Signature(s) Signed: 08/19/2021 5:20:32 PM By: Baruch Gouty RN, BSN Entered By: Baruch Gouty on 08/19/2021 10:41:07 -------------------------------------------------------------------------------- Multi Wound Chart Details Patient Name: Date of Service: [name], [name] 08/19/2021 10:45 A M Medical Record Number: [id number] Patient Account Number: [id number] Date of Birth/Sex: Treating RN: 1961/01/07 (60 y.o. F) Primary Rivera Nyisha Clippard: [name] Rivera Other Clinician: Referring Halsey Persaud: Treating Ladeja Pelham/Extender: [name] in Treatment: 16 Vital Signs Height(in): 73 Capillary Blood  Glucose(mg/dl): 132 Weight(lbs): 301 Pulse(bpm): 104 Body Mass Index(BMI): 40 Blood Pressure(mmHg): 142/76 Temperature(F): 98.4 Respiratory Rate(breaths/min): 18 Photos: [N/A:N/A] Right, Posterior T Great oe Right, Distal T Great oe N/A Wound Location: Gradually Appeared Trauma N/A Wounding Event: Diabetic Wound/Ulcer of the Lower Diabetic Wound/Ulcer of the Lower N/A Primary Etiology: Extremity Extremity Anemia, Asthma, Hypertension, Type Anemia, Asthma, Hypertension, Type N/A Comorbid History: II Diabetes, Osteomyelitis, II Diabetes, Osteomyelitis, Neuropathy, Confinement Anxiety Neuropathy, Confinement Anxiety 04/05/2021 08/18/2021 N/A Date  Acquired: 16 0 N/A Weeks of Treatment: Open Open N/A Wound Status: 0.1x0.3x0.4 0.3x0.7x0.1 N/A Measurements L x W x D (cm) 0.024 0.165 N/A A (cm) : rea 0.009 0.016 N/A Volume (cm) : 48.90% 0.00% N/A % Reduction in A rea: 35.70% 0.00% N/A % Reduction in Volume: 5 Starting Position 1 (o'clock): 7 Ending Position 1 (o'clock): 0.3 Maximum Distance 1 (cm): Yes No N/A Undermining: Grade 2 Grade 1 N/A Classification: Small Small N/A Exudate A mount: Serosanguineous Serosanguineous N/A Exudate Type: red, brown red, brown N/A Exudate Color: Well defined, not attached Flat and Intact N/A Wound Margin: Large (67-100%) Large (67-100%) N/A Granulation A mount: Red Red N/A Granulation Quality: None Present (0%) None Present (0%) N/A Necrotic A mount: Fat Layer (Subcutaneous Tissue): Yes Fat Layer (Subcutaneous Tissue): Yes N/A Exposed Structures: Fascia: No Fascia: No Tendon: No Tendon: No Muscle: No Muscle: No Joint: No Joint: No Bone: No Bone: No Medium (34-66%) Small (1-33%) N/A Epithelialization: Treatment Notes Wound #13 (Toe Great) Wound Laterality: Right, Posterior Cleanser Wound Cleanser Discharge Instruction: Cleanse the wound with wound cleanser prior to applying a clean dressing using gauze sponges,  not tissue or cotton balls. Peri-Wound Rivera Topical Gentamicin Discharge Instruction: thin layer to wound bed Primary Dressing KerraCel Ag Gelling Fiber Dressing, 4x5 in (silver alginate) Discharge Instruction: Apply silver alginate to wound bed as instructed Secondary Dressing Woven Gauze Sponge, Non-Sterile 4x4 in Discharge Instruction: Apply over primary dressing as directed. Optifoam Non-Adhesive Dressing, 4x4 in Discharge Instruction: **Foam donut***Apply over primary dressing as directed. Secured With Conforming Stretch Gauze Bandage, Sterile 2x75 (in/in) Discharge Instruction: Secure with stretch gauze as directed. 27M Medipore H Soft Cloth Surgical T ape, 2x2 (in/yd) Discharge Instruction: Secure dressing with tape as directed. Compression Wrap Compression Stockings Add-Ons Wound #15 (Toe Great) Wound Laterality: Right, Distal Cleanser Wound Cleanser Discharge Instruction: Cleanse the wound with wound cleanser prior to applying a clean dressing using gauze sponges, not tissue or cotton balls. Peri-Wound Rivera Topical Gentamicin Discharge Instruction: thin layer to wound bed Primary Dressing KerraCel Ag Gelling Fiber Dressing, 4x5 in (silver alginate) Discharge Instruction: Apply silver alginate to wound bed as instructed Secondary Dressing Woven Gauze Sponge, Non-Sterile 4x4 in Discharge Instruction: Apply over primary dressing as directed. Optifoam Non-Adhesive Dressing, 4x4 in Discharge Instruction: **Foam donut***Apply over primary dressing as directed. Secured With Conforming Stretch Gauze Bandage, Sterile 2x75 (in/in) Discharge Instruction: Secure with stretch gauze as directed. 27M Medipore H Soft Cloth Surgical T ape, 2x2 (in/yd) Discharge Instruction: Secure dressing with tape as directed. Compression Wrap Compression Stockings Add-Ons Electronic Signature(s) Signed: 08/19/2021 11:34:30 AM By: Kalman Shan DO Signed: 08/19/2021 11:34:30 AM By: Kalman Shan DO Entered By: Kalman Shan on 08/19/2021 11:23:56 -------------------------------------------------------------------------------- Multi-Disciplinary Rivera Plan Details Patient Name: Date of Service: [name], [name] 08/19/2021 10:45 A M Medical Record Number: [id number] Patient Account Number: [id number] Date of Birth/Sex: Treating RN: 03/27/1961 (60 y.o. [name] Mardell Cragg: [name] Rivera Other Clinician: Referring Taliesin Hartlage: Treating Arush Gatliff/Extender: [name] in Treatment: 16 Multidisciplinary Rivera Plan reviewed with physician Active Inactive Nutrition Nursing Diagnoses: Impaired glucose control: actual or potential Potential for alteratiion in Nutrition/Potential for imbalanced nutrition Goals: Patient/caregiver agrees to and verbalizes understanding of need to obtain nutritional consultation Date Initiated: 05/06/2021 Date Inactivated: 06/04/2021 Target Resolution Date: 05/27/2021 Goal Status: Met Patient/caregiver will maintain therapeutic glucose control Date Initiated: 05/06/2021 Target Resolution Date: 09/16/2021 Goal Status: Active Interventions: Assess HgA1c results as ordered upon admission and as needed Provide education  on elevated blood sugars and impact on wound healing Provide education on nutrition Treatment Activities: Patient referred to Primary Rivera Physician for further nutritional evaluation : 05/06/2021 Notes: Wound/Skin Impairment Nursing Diagnoses: Knowledge deficit related to ulceration/compromised skin integrity Goals: Patient/caregiver will verbalize understanding of skin Rivera regimen Date Initiated: 05/06/2021 Target Resolution Date: 09/16/2021 Goal Status: Active Interventions: Assess patient/caregiver ability to obtain necessary supplies Assess patient/caregiver ability to perform ulcer/skin Rivera regimen upon admission and as needed Provide education on ulcer and skin Rivera Treatment  Activities: Skin Rivera regimen initiated : 05/06/2021 Topical wound management initiated : 05/06/2021 Notes: Electronic Signature(s) Signed: 08/19/2021 5:20:32 PM By: Baruch Gouty RN, BSN Entered By: Baruch Gouty on 08/19/2021 10:51:05 -------------------------------------------------------------------------------- Pain Assessment Details Patient Name: Date of Service: [name], [name] 08/19/2021 10:45 A M Medical Record Number: [id number] Patient Account Number: [id number] Date of Birth/Sex: Treating RN: 1961-09-26 (60 y.o. [name] Jamaris Theard: [name] Rivera Other Clinician: Referring Savanna Dooley: Treating Pier Laux/Extender: [name] in Treatment: 16 Active Problems Location of Pain Severity and Description of Pain Patient Has Paino No Site Locations Rate the pain. Current Pain Level: 0 Pain Management and Medication Current Pain Management: Electronic Signature(s) Signed: 08/19/2021 5:20:32 PM By: Baruch Gouty RN, BSN Entered By: Baruch Gouty on 08/19/2021 10:39:22 -------------------------------------------------------------------------------- Patient/Caregiver Education Details Patient Name: Date of Service: [name], [name] 9/22/2022andnbsp10:45 [place] Record Number: [id number] Patient Account Number: [id number] Date of Birth/Gender: Treating RN: [birth year] (60 y.o. [name] Physician: [name] Rivera Other Clinician: Referring Physician: Treating Physician/Extender: [name] in Treatment: 16 Education Assessment Education Provided To: Patient Education Topics Provided Elevated Blood Sugar/ Impact on Healing: Methods: Explain/Verbal Responses: Reinforcements needed, State content correctly Offloading: Methods: Explain/Verbal Responses: Reinforcements needed, State content correctly Wound/Skin Impairment: Methods: Explain/Verbal Responses:  Reinforcements needed, State content correctly Electronic Signature(s) Signed: 08/19/2021 5:20:32 PM By: Baruch Gouty RN, BSN Entered By: Baruch Gouty on 08/19/2021 10:51:45 -------------------------------------------------------------------------------- Wound Assessment Details Patient Name: Date of Service: [name], [name] 08/19/2021 10:45 A M Medical Record Number: [id number] Patient Account Number: [id number] Date of Birth/Sex: Treating RN: September 06, 1961 (60 y.o. [name] Billi Bright: [name] Rivera Other Clinician: Referring Aren Cherne: Treating Deanza Upperman/Extender: [name] in Treatment: 16 Wound Status Wound Number: 13 Primary Diabetic Wound/Ulcer of the Lower Extremity Etiology: Wound Location: Right, Posterior T Great oe Wound Open Wounding Event: Gradually Appeared Status: Date Acquired: 04/05/2021 Comorbid Anemia, Asthma, Hypertension, Type II Diabetes, Osteomyelitis, Weeks Of Treatment: 16 History: Neuropathy, Confinement Anxiety Clustered Wound: No Photos Wound Measurements Length: (cm) 0.1 Width: (cm) 0.3 Depth: (cm) 0.4 Area: (cm) 0.024 Volume: (cm) 0.009 % Reduction in Area: 48.9% % Reduction in Volume: 35.7% Epithelialization: Medium (34-66%) Tunneling: No Undermining: Yes Starting Position (o'clock): 5 Ending Position (o'clock): 7 Maximum Distance: (cm) 0.3 Wound Description Classification: Grade 2 Wound Margin: Well defined, not attached Exudate Amount: Small Exudate Type: Serosanguineous Exudate Color: red, brown Foul Odor After Cleansing: No Slough/Fibrino No Wound Bed Granulation Amount: Large (67-100%) Exposed Structure Granulation Quality: Red Fascia Exposed: No Necrotic Amount: None Present (0%) Fat Layer (Subcutaneous Tissue) Exposed: Yes Tendon Exposed: No Muscle Exposed: No Joint Exposed: No Bone Exposed: No Treatment Notes Wound #13 (Toe Great) Wound Laterality: Right,  Posterior Cleanser Wound Cleanser Discharge Instruction: Cleanse the wound with wound cleanser prior to applying a clean dressing using gauze sponges, not tissue or cotton balls. Peri-Wound Rivera Topical Gentamicin Discharge Instruction: thin layer to wound  bed Primary Dressing KerraCel Ag Gelling Fiber Dressing, 4x5 in (silver alginate) Discharge Instruction: Apply silver alginate to wound bed as instructed Secondary Dressing Woven Gauze Sponge, Non-Sterile 4x4 in Discharge Instruction: Apply over primary dressing as directed. Optifoam Non-Adhesive Dressing, 4x4 in Discharge Instruction: **Foam donut***Apply over primary dressing as directed. Secured With Conforming Stretch Gauze Bandage, Sterile 2x75 (in/in) Discharge Instruction: Secure with stretch gauze as directed. 39M Medipore H Soft Cloth Surgical T ape, 2x2 (in/yd) Discharge Instruction: Secure dressing with tape as directed. Compression Wrap Compression Stockings Add-Ons Electronic Signature(s) Signed: 08/19/2021 4:57:50 PM By: Lorrin Jackson Signed: 08/19/2021 5:20:32 PM By: Baruch Gouty RN, BSN Entered By: Lorrin Jackson on 08/19/2021 10:49:32 -------------------------------------------------------------------------------- Wound Assessment Details Patient Name: Date of Service: [name], [name] 08/19/2021 10:45 A M Medical Record Number: [id number] Patient Account Number: [id number] Date of Birth/Sex: Treating RN: May 15, 1961 (60 y.o. [name] Neno [place]: [name] Rivera Other Clinician: Referring Malene Blaydes: Treating Kenlie Seki/Extender: [name] in Treatment: 16 Wound Status Wound Number: 15 Primary Diabetic Wound/Ulcer of the Lower Extremity Etiology: Wound Location: Right, Distal T Great oe Wound Open Wounding Event: Trauma Status: Date Acquired: 08/18/2021 Comorbid Anemia, Asthma, Hypertension, Type II Diabetes, Osteomyelitis, Weeks Of Treatment: 0 History:  Neuropathy, Confinement Anxiety Clustered Wound: No Photos Wound Measurements Length: (cm) 0.3 Width: (cm) 0.7 Depth: (cm) 0.1 Area: (cm) 0.165 Volume: (cm) 0.016 % Reduction in Area: 0% % Reduction in Volume: 0% Epithelialization: Small (1-33%) Tunneling: No Undermining: No Wound Description Classification: Grade 1 Wound Margin: Flat and Intact Exudate Amount: Small Exudate Type: Serosanguineous Exudate Color: red, brown Foul Odor After Cleansing: No Slough/Fibrino No Wound Bed Granulation Amount: Large (67-100%) Exposed Structure Granulation Quality: Red Fascia Exposed: No Necrotic Amount: None Present (0%) Fat Layer (Subcutaneous Tissue) Exposed: Yes Tendon Exposed: No Muscle Exposed: No Joint Exposed: No Bone Exposed: No Treatment Notes Wound #15 (Toe Great) Wound Laterality: Right, Distal Cleanser Wound Cleanser Discharge Instruction: Cleanse the wound with wound cleanser prior to applying a clean dressing using gauze sponges, not tissue or cotton balls. Peri-Wound Rivera Topical Gentamicin Discharge Instruction: thin layer to wound bed Primary Dressing KerraCel Ag Gelling Fiber Dressing, 4x5 in (silver alginate) Discharge Instruction: Apply silver alginate to wound bed as instructed Secondary Dressing Woven Gauze Sponge, Non-Sterile 4x4 in Discharge Instruction: Apply over primary dressing as directed. Optifoam Non-Adhesive Dressing, 4x4 in Discharge Instruction: **Foam donut***Apply over primary dressing as directed. Secured With Conforming Stretch Gauze Bandage, Sterile 2x75 (in/in) Discharge Instruction: Secure with stretch gauze as directed. 39M Medipore H Soft Cloth Surgical T ape, 2x2 (in/yd) Discharge Instruction: Secure dressing with tape as directed. Compression Wrap Compression Stockings Add-Ons Electronic Signature(s) Signed: 08/19/2021 4:57:50 PM By: Lorrin Jackson Signed: 08/19/2021 5:20:32 PM By: Baruch Gouty RN, BSN Entered By:  Lorrin Jackson on 08/19/2021 10:50:18 -------------------------------------------------------------------------------- Vitals Details Patient Name: Date of Service: [name], [name] 08/19/2021 10:45 A M Medical Record Number: [id number] Patient Account Number: [id number] Date of Birth/Sex: Treating RN: 1961/08/09 (60 y.o. [name] Sigrid Schwebach: [name] Rivera Other Clinician: Referring Yina Riviere: Treating Ysabela Keisler/Extender: [name] in Treatment: 16 Vital Signs Time Taken: 10:38 Temperature (F): 98.4 Height (in): 73 Pulse (bpm): 104 Source: Stated Respiratory Rate (breaths/min): 18 Weight (lbs): 301 Blood Pressure (mmHg): 142/76 Body Mass Index (BMI): 39.7 Capillary Blood Glucose (mg/dl): 132 Reference Range: 80 - 120 mg / dl Notes glucose per pt report this am Electronic Signature(s) Signed: 08/19/2021 5:20:32 PM By: Baruch Gouty RN, BSN  Entered By: Baruch Gouty on 08/19/2021 10:39:14

## 2021-08-19 NOTE — Progress Notes (Signed)
[name], [name] ([id number]) Visit Report for 08/19/2021 Chief Complaint Document Details Patient Name: Date of Service: [name], [name] 08/19/2021 10:45 A M Medical Record Number: [id number] Patient Account Number: [id number] Date of Birth/Sex: Treating RN: [birth year] (60 y.o. F) Primary Care Provider: Christiana Fuchs Other Clinician: Referring Provider: Treating Provider/Extender: Doran Stabler in Treatment: 16 Information Obtained from: Patient Chief Complaint Right great toe wound Electronic Signature(s) Signed: 08/19/2021 11:34:30 AM By: Kalman Shan DO Entered By: Kalman Shan on 08/19/2021 11:24:14 -------------------------------------------------------------------------------- HPI Details Patient Name: Date of Service: [name], [name] 08/19/2021 10:45 A M Medical Record Number: [id number] Patient Account Number: [id number] Date of Birth/Sex: Treating RN: 1960-12-25 (60 y.o. F) Primary Care Provider: Christiana Fuchs Other Clinician: Referring Provider: Treating Provider/Extender: Doran Stabler in Treatment: 16 History of Present Illness Location: notice some discharge and callus on the right big toe Quality: Patient reports experiencing a dull pain to affected area(s). Severity: Patient states wound(s) are getting worse. Duration: Patient has had the wound for < 2 weeks prior to presenting for treatment Timing: Pain in wound is Intermittent (comes and goes Context: The wound occurred when the patient was cutting her toenails and noticed some discharge from this area and was very concerned about it. Modifying Factors: Patient wound(s)/ulcer(s) are improving due to:he has seen her PCP who put her on Bactrim and Keflex antibiotic ssociated Signs and Symptoms: Patient reports having decrease discharge she started on her antibiotic. A HPI Description: [name] is a 60 year old female with a past medical history of  uncontrolled type 2 diabetes on insulin, osteomyelitis with amputations of 1st and 2nd toes of her left foot that presents to clinic for wound care to the right great toe wound. She states she noticed the wound 3 weeks ago. Not quite sure how it started. She has been using silver alginate to the area. She was recently hospitalized from a clinic visit in the internal medicine clinic for this issue. While hospitalized she had an MRI of the right foot that showed likely early acute osteomyelitis with possible septic arthritis. She was discharged on ciprofloxacin for 28 days. She currently does not deny pain increased warmth or erythema to the area. She has some drainage but not purulent. 6/9; patient presents for 1 week follow-up. She has no complaints today and denies signs of infection. She missed her infectious disease appointment yesterday but has it rescheduled for the 21st. She has been using silver alginate to the wound. 6/23; patient presents for 2-week follow-up. She followed up with Dr. Megan Salon, infectious disease 2 days ago. She had some blood work done and was told to finish ciprofloxacin. She is having a follow-up virtual encounter next week. There has been an increase in callus development. She is unable to use her offloading shoe due to having to walk for long periods due to needing public transportation. She continues to use silver alginate to the wound. She denies signs of infection. 7/8; patient presents for follow-up. She missed her appointment last week. She reports increased callus to the surrounding wound area. She continues to use silver alginate. She denies signs of infection. 7/14; patient presents for 1 week follow-up. She has been using silver alginate to the wound bed. She has no issues or complaints today. She denies signs of infection. 7/28; patient presents for 2-week follow-up. She has been using silver alginate to the wound bed. She has been moving to a new location  over the past week. She  has no issues or complaints today. She denies signs of infection. 8/18; patient presents for follow-up. She was recently hospitalized for COVID and has not been able to follow-up for wound care. She was last seen 3 weeks ago. She reports receiving wound care while hospitalized. Since discharge of the hospital she is reported more drainage from the wound site. She denies signs of infection. 8/25; patient presents for follow-up. She has no issues or complaints today. She has been using silver alginate daily to the wound bed. She denies signs of infection. 9/15; patient presents for follow-up. She missed her last clinic appointment. She reports using silver alginate to the wound bed. She denies signs of infection. Due to transportation she has to walk for long periods of time. It is hard for the patient to offload this area well. 9/22; patient presents for follow-up. She has been using silver alginate to the wound bed. She reports a new wound to the distal portion of her right great toe. She states she stopped this last week. She has started Keflex for a wound culture done at last clinic visit. Due to cost she is not able to use the gentamicin cream prescribed. She states she is going to try different pharmacy to see if she can obtain this Electronic Signature(s) Signed: 08/19/2021 11:34:30 AM By: Kalman Shan DO Entered By: Kalman Shan on 08/19/2021 11:25:17 -------------------------------------------------------------------------------- Physical Exam Details Patient Name: Date of Service: [name], [name] 08/19/2021 10:45 A M Medical Record Number: [id number] Patient Account Number: [id number] Date of Birth/Sex: Treating RN: [birth year] (60 y.o. F) Primary Care Provider: Christiana Fuchs Other Clinician: Referring Provider: Treating Provider/Extender: Doran Stabler in Treatment: 16 Constitutional respirations regular, non-labored and  within target range for patient.. Cardiovascular 2+ dorsalis pedis/posterior tibialis pulses. Psychiatric pleasant and cooperative. Notes Right great toe: Posterior aspect with open wound. Granulation tissue and nonviable tissue present. There is a cut to this wound that has created a flap to the bottom portion with callus and skin present. T the distal portion there is an open wound with granulation tissue present. No signs of infection. o Electronic Signature(s) Signed: 08/19/2021 11:34:30 AM By: Kalman Shan DO Entered By: Kalman Shan on 08/19/2021 11:26:54 -------------------------------------------------------------------------------- Physician Orders Details Patient Name: Date of Service: [name], [name] 08/19/2021 10:45 A M Medical Record Number: [id number] Patient Account Number: [id number] Date of Birth/Sex: Treating RN: November 05, 1961 (60 y.o. 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 - with Dr. Dellia Nims Return A ppointment in 2 weeks. - with Dr. Heber [place] Return A Bathing/ Shower/ Hygiene May shower with protection but do not get wound dressing(s) wet. - May use cast wrap for protection Edema Control - Lymphedema / SCD / Other Elevate legs to the level of the heart or above for 30 minutes daily and/or when sitting, a frequency of: - 3-4 times throughout the day. Avoid standing for long periods of time. Off-Loading Wedge shoe to: - Patient to wear while standing and  walking. Wound Treatment Wound #13 - T Great oe Wound Laterality: Right, Posterior Cleanser: Wound Cleanser (Generic) 1 x Per Day/15  Days Discharge Instructions: Cleanse the wound with wound cleanser prior to applying a clean dressing using gauze sponges, not tissue or cotton balls. Topical: Gentamicin 1 x Per Day/15 Days Discharge Instructions: thin layer to wound bed Prim Dressing: KerraCel Ag Gelling Fiber Dressing, 4x5 in (silver alginate) (Generic) 1 x Per Day/15 Days ary Discharge Instructions: Apply silver alginate to wound bed as instructed Secondary Dressing: Woven Gauze Sponge, Non-Sterile 4x4 in (Generic) 1 x Per Day/15 Days Discharge Instructions: Apply over primary dressing as directed. Secondary Dressing: Optifoam Non-Adhesive Dressing, 4x4 in (Generic) 1 x Per Day/15 Days Discharge Instructions: **Foam donut***Apply over primary dressing as directed. Secured With: Child psychotherapist, Sterile 2x75 (in/in) (Generic) 1 x Per Day/15 Days Discharge Instructions: Secure with stretch gauze as directed. Secured With: 49M Medipore H Soft Cloth Surgical Tape, 2x2 (in/yd) (Generic) 1 x Per Day/15 Days Discharge Instructions: Secure dressing with tape as directed. Wound #15 - T Great oe Wound Laterality: Right, Distal Cleanser: Wound Cleanser (Generic) 1 x Per Day/15 Days Discharge Instructions: Cleanse the wound with wound cleanser prior to applying a clean dressing using gauze sponges, not tissue or cotton balls. Topical: Gentamicin 1 x Per Day/15 Days Discharge Instructions: thin layer to wound bed Prim Dressing: KerraCel Ag Gelling Fiber Dressing, 4x5 in (silver alginate) (Generic) 1 x Per Day/15 Days ary Discharge Instructions: Apply silver alginate to wound bed as instructed Secondary Dressing: Woven Gauze Sponge, Non-Sterile 4x4 in (Generic) 1 x Per Day/15 Days Discharge Instructions: Apply over primary dressing as directed. Secondary Dressing: Optifoam  Non-Adhesive Dressing, 4x4 in (Generic) 1 x Per Day/15 Days Discharge Instructions: **Foam donut***Apply over primary dressing as directed. Secured With: Child psychotherapist, Sterile 2x75 (in/in) (Generic) 1 x Per Day/15 Days Discharge Instructions: Secure with stretch gauze as directed. Secured With: 49M Medipore H Soft Cloth Surgical Tape, 2x2 (in/yd) (Generic) 1 x Per Day/15 Days Discharge Instructions: Secure dressing with tape as directed. Electronic Signature(s) Signed: 08/19/2021 11:34:30 AM By: Kalman Shan DO Entered By: Kalman Shan on 08/19/2021 11:28:10 -------------------------------------------------------------------------------- Problem List Details Patient Name: Date of Service: [name], [name] 08/19/2021 10:45 A M Medical Record Number: [id number] Patient Account Number: [id number] Date of Birth/Sex: Treating RN: 11/09/1961 (60 y.o. 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 F) Primary Care Provider: Christiana Fuchs Other Clinician: Referring Provider: Treating Provider/Extender: Doran Stabler in Treatment: 16 Subjective Chief Complaint Information obtained from Patient Right great toe wound History of Present Illness (HPI) The following HPI elements were documented for the patient's wound: Location: notice some discharge and callus on the right big toe Quality: Patient reports experiencing a dull pain to affected area(s). Severity: Patient states wound(s) are getting worse. Duration: Patient has had the wound  for < 2 weeks prior to presenting for treatment Timing: Pain in wound is Intermittent (comes and goes Context: The wound occurred when the patient was cutting her toenails and noticed some discharge from this area and was very concerned about it. Modifying Factors: Patient wound(s)/ulcer(s) are improving due to:he has seen her PCP who put her on Bactrim and Keflex antibiotic Associated Signs and Symptoms: Patient reports having decrease discharge she started on her antibiotic. Ms. Analina Filla is a 60 year old female with a past medical history of uncontrolled type 2 diabetes on insulin, osteomyelitis with amputations of 1st and 2nd toes of her left foot that presents to clinic for wound care to the right great toe wound. She states she noticed the wound 3 weeks ago. Not quite sure how it started. She has been using silver alginate to the area. She was recently hospitalized from a clinic visit in the internal medicine clinic for this issue. While hospitalized she had an MRI of the right foot that showed likely early acute osteomyelitis with possible septic arthritis. She was discharged on ciprofloxacin for 28 days. She currently does not deny pain increased warmth or erythema to the area. She has some drainage but not purulent. 6/9; patient presents for 1 week follow-up. She has no complaints today and  denies signs of infection. She missed her infectious disease appointment yesterday but has it rescheduled for the 21st. She has been using silver alginate to the wound. 6/23; patient presents for 2-week follow-up. She followed up with Dr. Megan Salon, infectious disease 2 days ago. She had some blood work done and was told to finish ciprofloxacin. She is having a follow-up virtual encounter next week. There has been an increase in callus development. She is unable to use her offloading shoe due to having to walk for long periods due to needing public transportation. She continues to use silver alginate to the wound. She denies signs of infection. 7/8; patient presents for follow-up. She missed her appointment last week. She reports increased callus to the surrounding wound area. She continues to use silver alginate. She denies signs of infection. 7/14; patient presents for 1 week follow-up. She has been using silver alginate to the wound bed. She has no issues or complaints today. She denies signs of infection. 7/28; patient presents for 2-week follow-up. She has been using silver alginate to the wound bed. She has been moving to a new location over the past week. She has no issues or complaints today. She denies signs of infection. 8/18; patient presents for follow-up. She was recently hospitalized for COVID and has not been able to follow-up for wound care. She was last seen 3 weeks ago. She reports receiving wound care while hospitalized. Since discharge of the hospital she is reported more drainage from the wound site. She denies signs of infection. 8/25; patient presents for follow-up. She has no issues or complaints today. She has been using silver alginate daily to the wound bed. She denies signs of infection. 9/15; patient presents for follow-up. She missed her last clinic appointment. She reports using silver alginate to the wound bed. She denies signs of infection. Due to transportation she  has to walk for long periods of time. It is hard for the patient to offload this area well. 9/22; patient presents for follow-up. She has been using silver alginate to the wound bed. She reports a new wound to the distal portion of her right great toe. She states she stopped this last week. She has started  Keflex for a wound culture done at last clinic visit. Due to cost she is not able to use the gentamicin cream prescribed. She states she is going to try different pharmacy to see if she can obtain this Patient History Information obtained from Patient. Family History Diabetes - Father,Child, Hypertension - Mother,Father, No family history of Cancer, Heart Disease, Hereditary Spherocytosis, Kidney Disease, Lung Disease, Seizures, Stroke, Thyroid Problems, Tuberculosis. Social History Never smoker, Marital Status - Divorced, Alcohol Use - Never, Drug Use - No History, Caffeine Use - Rarely - soda. Medical History Eyes Denies history of Cataracts, Glaucoma, Optic Neuritis Ear/Nose/Mouth/Throat Denies history of Chronic sinus problems/congestion, Middle ear problems Hematologic/Lymphatic Patient has history of Anemia Denies history of Hemophilia, Human Immunodeficiency Virus, Lymphedema, Sickle Cell Disease Respiratory Patient has history of Asthma Denies history of Aspiration, Chronic Obstructive Pulmonary Disease (COPD), Pneumothorax, Sleep Apnea, Tuberculosis Cardiovascular Patient has history of Hypertension Denies history of Angina, Arrhythmia, Congestive Heart Failure, Coronary Artery Disease, Deep Vein Thrombosis, Hypotension, Myocardial Infarction, Peripheral Arterial Disease, Peripheral Venous Disease, Phlebitis, Vasculitis Gastrointestinal Denies history of Cirrhosis , Colitis, Crohnoos, Hepatitis A, Hepatitis B, Hepatitis C Endocrine Patient has history of Type II Diabetes Denies history of Type I Diabetes Genitourinary Denies history of End Stage Renal  Disease Immunological Denies history of Lupus Erythematosus, Raynaudoos, Scleroderma Integumentary (Skin) Denies history of History of Burn Musculoskeletal Patient has history of Osteomyelitis - hx left great toe Denies history of Gout, Rheumatoid Arthritis, Osteoarthritis Neurologic Patient has history of Neuropathy Denies history of Dementia, Quadriplegia, Paraplegia, Seizure Disorder Oncologic Denies history of Received Chemotherapy, Received Radiation Psychiatric Patient has history of Confinement Anxiety Denies history of Anorexia/bulimia Patient is treated with Insulin, Oral Agents. Blood sugar is tested. Blood sugar results noted at the following times: Lunch - 120-160. Hospitalization/Surgery History - left second toe amputation. - left great toe amputation. - c-section x4. Medical A Surgical History Notes nd Gastrointestinal GERD Objective Constitutional respirations regular, non-labored and within target range for patient.. Vitals Time Taken: 10:38 AM, Height: 73 in, Source: Stated, Weight: 301 lbs, BMI: 39.7, Temperature: 98.4 F, Pulse: 104 bpm, Respiratory Rate: 18 breaths/min, Blood Pressure: 142/76 mmHg, Capillary Blood Glucose: 132 mg/dl. General Notes: glucose per pt report this am Cardiovascular 2+ dorsalis pedis/posterior tibialis pulses. Psychiatric pleasant and cooperative. General Notes: Right great toe: Posterior aspect with open wound. Granulation tissue and nonviable tissue present. There is a cut to this wound that has created a flap to the bottom portion with callus and skin present. T the distal portion there is an open wound with granulation tissue present. No signs of o infection. Integumentary (Hair, Skin) Wound #13 status is Open. Original cause of wound was Gradually Appeared. The date acquired was: 04/05/2021. The wound has been in treatment 16 weeks. The wound is located on the [place]. The wound measures 0.1cm length x 0.3cm  width x 0.4cm depth; 0.024cm^2 area and 0.009cm^3 oe volume. There is Fat Layer (Subcutaneous Tissue) exposed. There is no tunneling noted, however, there is undermining starting at 5:00 and ending at 7:00 with a maximum distance of 0.3cm. There is a small amount of serosanguineous drainage noted. The wound margin is well defined and not attached to the wound base. There is large (67-100%) red granulation within the wound bed. There is no necrotic tissue within the wound bed. Wound #15 status is Open. Original cause of wound was Trauma. The date acquired was: 08/18/2021. The wound is located on the Right,Distal T [place]. The oe  wound measures 0.3cm length x 0.7cm width x 0.1cm depth; 0.165cm^2 area and 0.016cm^3 volume. There is Fat Layer (Subcutaneous Tissue) exposed. There is no tunneling or undermining noted. There is a small amount of serosanguineous drainage noted. The wound margin is flat and intact. There is large (67- 100%) red granulation within the wound bed. There is no necrotic tissue within the wound bed. 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 I recommended finishing Keflex that was sensitive Klebsiella pneumoniae and Streptococcus from culture done at last clinic visit. Ideally I would like for her to use gentamicin ointment to this area as I think she would benefit from a topical antibiotic ointment to address any bioburden. We will place this in office today. She states she is going to try another pharmacy to obtain this. She also has a new wound to the distal portion of her right great toe that was due to trauma. I recommended silver alginate to this area as well. We had a discussion about the importance of  glycemic control. She understands this delays wound healing. Her last hemoglobin A1c was 9.6 on 06/30/2021. Plan Follow-up Appointments: Return Appointment in 1 week. - with Dr. Dellia Nims Return Appointment in 2 weeks. - with Dr. Heber [place] Bathing/ Shower/ Hygiene: May shower with protection but do not get wound dressing(s) wet. - May use cast wrap for protection Edema Control - Lymphedema / SCD / Other: Elevate legs to the level of the heart or above for 30 minutes daily and/or when sitting, a frequency of: - 3-4 times throughout the day. Avoid standing for long periods of time. Off-Loading: Wedge shoe to: - Patient to wear while standing and walking. WOUND #13: - T Great Wound Laterality: Right, Posterior oe Cleanser: Wound Cleanser (Generic) 1 x Per Day/15 Days Discharge Instructions: Cleanse the wound with wound cleanser prior to applying a clean dressing using gauze sponges, not tissue or cotton balls. Topical: Gentamicin 1 x Per Day/15 Days Discharge Instructions: thin layer to wound bed Prim Dressing: KerraCel Ag Gelling Fiber Dressing, 4x5 in (silver alginate) (Generic) 1 x Per Day/15 Days ary Discharge Instructions: Apply silver alginate to wound bed as instructed Secondary Dressing: Woven Gauze Sponge, Non-Sterile 4x4 in (Generic) 1 x Per Day/15 Days Discharge Instructions: Apply over primary dressing as directed. Secondary Dressing: Optifoam Non-Adhesive Dressing, 4x4 in (Generic) 1 x Per Day/15 Days Discharge Instructions: **Foam donut***Apply over primary dressing as directed. Secured With: Child psychotherapist, Sterile 2x75 (in/in) (Generic) 1 x Per Day/15 Days Discharge Instructions: Secure with stretch gauze as directed. Secured With: 77M Medipore H Soft Cloth Surgical T ape, 2x2 (in/yd) (Generic) 1 x Per Day/15 Days Discharge Instructions: Secure dressing with tape as directed. WOUND #15: - T Great Wound Laterality: Right, Distal oe Cleanser: Wound Cleanser  (Generic) 1 x Per Day/15 Days Discharge Instructions: Cleanse the wound with wound cleanser prior to applying a clean dressing using gauze sponges, not tissue or cotton balls. Topical: Gentamicin 1 x Per Day/15 Days Discharge Instructions: thin layer to wound bed Prim Dressing: KerraCel Ag Gelling Fiber Dressing, 4x5 in (silver alginate) (Generic) 1 x Per Day/15 Days ary Discharge Instructions: Apply silver alginate to wound bed as instructed Secondary Dressing: Woven Gauze Sponge, Non-Sterile 4x4 in (Generic) 1 x Per Day/15 Days Discharge Instructions:  Apply over primary dressing as directed. Secondary Dressing: Optifoam Non-Adhesive Dressing, 4x4 in (Generic) 1 x Per Day/15 Days Discharge Instructions: **Foam donut***Apply over primary dressing as directed. Secured With: Child psychotherapist, Sterile 2x75 (in/in) (Generic) 1 x Per Day/15 Days Discharge Instructions: Secure with stretch gauze as directed. Secured With: 50M Medipore H Soft Cloth Surgical T ape, 2x2 (in/yd) (Generic) 1 x Per Day/15 Days Discharge Instructions: Secure dressing with tape as directed. 1. Finish Keflex 2. Continue silver alginate 3. Gentamicin ointment in the office 4. Follow-up in 1 week Electronic Signature(s) Signed: 08/19/2021 11:34:30 AM By: Kalman Shan DO Entered By: Kalman Shan on 08/19/2021 11:32:10 -------------------------------------------------------------------------------- HxROS Details Patient Name: Date of Service: [name], [name] 08/19/2021 10:45 A M Medical Record Number: [id number] Patient Account Number: [id number] Date of Birth/Sex: Treating RN: [birth year] (60 y.o. F) Primary Care Provider: Christiana Fuchs Other Clinician: Referring Provider: Treating Provider/Extender: Doran Stabler in Treatment: 16 Label Progress Note Print Version as History and Physical for this encounter Information Obtained From Patient Eyes Medical  History: Negative for: Cataracts; Glaucoma; Optic Neuritis Ear/Nose/Mouth/Throat Medical History: Negative for: Chronic sinus problems/congestion; Middle ear problems Hematologic/Lymphatic Medical History: Positive for: Anemia Negative for: Hemophilia; Human Immunodeficiency Virus; Lymphedema; Sickle Cell Disease Respiratory Medical History: Positive for: Asthma Negative for: Aspiration; Chronic Obstructive Pulmonary Disease (COPD); Pneumothorax; Sleep Apnea; Tuberculosis Cardiovascular Medical History: Positive for: Hypertension Negative for: Angina; Arrhythmia; Congestive Heart Failure; Coronary Artery Disease; Deep Vein Thrombosis; Hypotension; Myocardial Infarction; Peripheral Arterial Disease; Peripheral Venous Disease; Phlebitis; Vasculitis Gastrointestinal Medical History: Negative for: Cirrhosis ; Colitis; Crohns; Hepatitis A; Hepatitis B; Hepatitis C Past Medical History Notes: GERD Endocrine Medical History: Positive for: Type II Diabetes Negative for: Type I Diabetes Time with diabetes: 23 years Treated with: Insulin, Oral agents Blood sugar tested every day: Yes Tested : 2 times per day Blood sugar testing results: Lunch: 120-160 Genitourinary Medical History: Negative for: End Stage Renal Disease Immunological Medical History: Negative for: Lupus Erythematosus; Raynauds; Scleroderma Integumentary (Skin) Medical History: Negative for: History of Burn Musculoskeletal Medical History: Positive for: Osteomyelitis - hx left great toe Negative for: Gout; Rheumatoid Arthritis; Osteoarthritis Neurologic Medical History: Positive for: Neuropathy Negative for: Dementia; Quadriplegia; Paraplegia; Seizure Disorder Oncologic Medical History: Negative for: Received Chemotherapy; Received Radiation Psychiatric Medical History: Positive for: Confinement Anxiety Negative for: Anorexia/bulimia Immunizations Pneumococcal Vaccine: Received Pneumococcal Vaccination:  Yes Received Pneumococcal Vaccination On or After 60th Birthday: No Immunization Notes: pt. 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 c-section x4 Family and Social History Cancer: No; Diabetes: Yes - Father,Child; Heart Disease: No; Hereditary Spherocytosis: No; Hypertension: Yes - Mother,Father; Kidney Disease: No; Lung Disease: No; Seizures: No; Stroke: No; Thyroid Problems: No; Tuberculosis: No; Never smoker; Marital Status - Divorced; Alcohol Use: Never; Drug Use: No History; Caffeine Use: Rarely - soda; Financial Concerns: No; Food, Clothing or Shelter Needs: No; Support System Lacking: No; Transportation Concerns: No Electronic Signature(s) Signed: 08/19/2021 11:34:30 AM By: Kalman Shan DO Entered By: Kalman Shan on 08/19/2021 11:25:25 -------------------------------------------------------------------------------- SuperBill Details Patient Name: Date of Service: [name], [name] 08/19/2021 Medical Record Number: [id number] Patient Account Number: [id number] Date of Birth/Sex: Treating RN: 1961/08/23 (60 y.o. 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 E11.65 Type 2 diabetes mellitus with hyperglycemia Facility Procedures CPT4 Code: [id number] Description: 14604 - WOUND CARE VISIT-LEV 3 EST PT Modifier: Quantity: 1 Physician Procedures : CPT4 Code Description Modifier [id number] 99213 - WC PHYS LEVEL 3 - EST PT ICD-10 Diagnosis Description E11.621 Type 2 diabetes mellitus with foot ulcer  M86.171 Other acute osteomyelitis, right ankle and foot L97.519 Non-pressure chronic ulcer of other  part of right foot with unspecified severity E11.65 Type 2 diabetes mellitus with hyperglycemia Quantity: 1 Electronic Signature(s) Signed: 08/19/2021 11:34:30 AM By: Kalman Shan DO Entered By: Kalman Shan on 08/19/2021 11:34:02

## 2021-08-20 ENCOUNTER — Other Ambulatory Visit (HOSPITAL_COMMUNITY): Payer: Self-pay

## 2021-08-20 ENCOUNTER — Encounter: Payer: Self-pay | Admitting: Internal Medicine

## 2021-08-27 ENCOUNTER — Encounter (HOSPITAL_BASED_OUTPATIENT_CLINIC_OR_DEPARTMENT_OTHER): Payer: Medicaid Other | Admitting: Internal Medicine

## 2021-09-02 ENCOUNTER — Other Ambulatory Visit: Payer: Self-pay

## 2021-09-02 ENCOUNTER — Encounter (HOSPITAL_BASED_OUTPATIENT_CLINIC_OR_DEPARTMENT_OTHER): Payer: Medicaid Other | Attending: Internal Medicine | Admitting: Internal Medicine

## 2021-09-02 DIAGNOSIS — L97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 exposed: Secondary | ICD-10-CM | POA: Diagnosis not present

## 2021-09-02 DIAGNOSIS — Z794 Long term (current) use of insulin: Secondary | ICD-10-CM | POA: Insufficient documentation

## 2021-09-02 DIAGNOSIS — Z89422 Acquired absence of other left toe(s): Secondary | ICD-10-CM | POA: Diagnosis not present

## 2021-09-02 DIAGNOSIS — E1169 Type 2 diabetes mellitus with other specified complication: Secondary | ICD-10-CM | POA: Insufficient documentation

## 2021-09-02 DIAGNOSIS — E1165 Type 2 diabetes mellitus with hyperglycemia: Secondary | ICD-10-CM

## 2021-09-02 DIAGNOSIS — E11621 Type 2 diabetes mellitus with foot ulcer: Secondary | ICD-10-CM | POA: Diagnosis not present

## 2021-09-02 DIAGNOSIS — M86171 Other acute osteomyelitis, right ankle and foot: Secondary | ICD-10-CM | POA: Diagnosis not present

## 2021-09-02 DIAGNOSIS — Z89412 Acquired absence of left great toe: Secondary | ICD-10-CM | POA: Insufficient documentation

## 2021-09-06 ENCOUNTER — Other Ambulatory Visit: Payer: Self-pay | Admitting: Student

## 2021-09-06 ENCOUNTER — Other Ambulatory Visit (HOSPITAL_COMMUNITY): Payer: Self-pay

## 2021-09-06 DIAGNOSIS — E1142 Type 2 diabetes mellitus with diabetic polyneuropathy: Secondary | ICD-10-CM

## 2021-09-06 DIAGNOSIS — Z794 Long term (current) use of insulin: Secondary | ICD-10-CM

## 2021-09-07 ENCOUNTER — Other Ambulatory Visit: Payer: Self-pay | Admitting: Internal Medicine

## 2021-09-07 ENCOUNTER — Other Ambulatory Visit (HOSPITAL_COMMUNITY): Payer: Self-pay

## 2021-09-07 DIAGNOSIS — E1142 Type 2 diabetes mellitus with diabetic polyneuropathy: Secondary | ICD-10-CM

## 2021-09-07 MED ORDER — LANTUS SOLOSTAR 100 UNIT/ML ~~LOC~~ SOPN
70.0000 [IU] | PEN_INJECTOR | Freq: Every day | SUBCUTANEOUS | 3 refills | Status: DC
Start: 2021-09-07 — End: 2021-12-24
  Filled 2021-09-07: qty 15, 21d supply, fill #0

## 2021-09-07 MED ORDER — INSULIN PEN NEEDLE 31G X 5 MM MISC
99 refills | Status: DC
  Filled 2021-09-07: qty 100, 25d supply, fill #0

## 2021-09-07 MED ORDER — INSULIN LISPRO (1 UNIT DIAL) 100 UNIT/ML (KWIKPEN)
18.0000 [IU] | PEN_INJECTOR | Freq: Three times a day (TID) | SUBCUTANEOUS | 3 refills | Status: DC
  Filled 2021-09-07: qty 15, 28d supply, fill #0

## 2021-09-07 NOTE — Progress Notes (Signed)
[name], BAYLISS ([id number]) Visit Report for 09/02/2021 Arrival Information Details Patient Name: Date of Service: Rivera, Debra 09/02/2021 9:45 A M Medical Record Number: [id number] Patient Account Number: [id number] Date of Birth/Sex: Treating RN: 1961-05-23 (60 y.o. [name] Primary Care Joell Usman: Christiana Fuchs Other Clinician: Referring Calob Baskette: Treating Gordie Belvin/Extender: Doran Stabler in Treatment: 18 Visit Information History Since Last Visit Added or deleted any medications: No Patient Arrived: Ambulatory Any new allergies or adverse reactions: No Arrival Time: 09:57 Had a fall or experienced change in No Accompanied By: self activities of daily living that may affect Transfer Assistance: None risk of falls: Patient Identification Verified: Yes Signs or symptoms of abuse/neglect since last visito No Secondary Verification Process Completed: Yes Hospitalized since last visit: No Patient Requires Transmission-Based Precautions: No Implantable device outside of the clinic excluding No Patient Has Alerts: Yes cellular tissue based products placed in the center Patient Alerts: ABI R=1.18 since last visit: ABI L=1.1 Has Dressing in Place as Prescribed: Yes Pain Present Now: No Electronic Signature(s) Signed: 09/02/2021 5:58:36 PM By: Lorrin Jackson Entered By: Lorrin Jackson on 09/02/2021 09:58:17 -------------------------------------------------------------------------------- Encounter Discharge Information Details Patient Name: Date of Service: [name], [facility] 09/02/2021 9:45 A M Medical Record Number: [id number] Patient Account Number: [id number] Date of Birth/Sex: Treating RN: 1961-04-02 (60 y.o. [name] Primary Care Tyree Vandruff: Christiana Fuchs Other Clinician: Referring Mekala Winger: Treating Anthony Roland/Extender: Doran Stabler in Treatment: 18 Encounter Discharge Information Items Discharge  Condition: Stable Ambulatory Status: Ambulatory Discharge Destination: Home Transportation: Private Auto Accompanied By: self Schedule Follow-up Appointment: Yes Clinical Summary of Care: Patient Declined Electronic Signature(s) Signed: 09/07/2021 5:02:53 PM By: Rhae Hammock RN Entered By: Rhae Hammock on 09/02/2021 10:23:18 -------------------------------------------------------------------------------- Lower Extremity Assessment Details Patient Name: Date of Service: [name], [facility] 09/02/2021 9:45 A M Medical Record Number: [id number] Patient Account Number: [id number] Date of Birth/Sex: Treating RN: 08-24-1961 (60 y.o. [name] Primary Care Jami Bogdanski: Christiana Fuchs Other Clinician: Referring Katalaya Beel: Treating Cystal Shannahan/Extender: Doran Stabler in Treatment: 18 Edema Assessment Assessed: [Left: No] Patrice Paradise: Yes] Edema: [Left: N] [Right: o] Calf Left: Right: Point of Measurement: 27 cm From Medial Instep 42 cm Ankle Left: Right: Point of Measurement: 9 cm From Medial Instep 24 cm Vascular Assessment Pulses: Dorsalis Pedis Palpable: [Right:Yes] Posterior Tibial Palpable: [Right:Yes] Electronic Signature(s) Signed: 09/02/2021 5:58:36 PM By: Lorrin Jackson Entered By: Lorrin Jackson on 09/02/2021 10:02:25 -------------------------------------------------------------------------------- Multi Wound Chart Details Patient Name: Date of Service: [name], [place] 09/02/2021 9:45 A M Medical Record Number: [id number] Patient Account Number: [id number] Date of Birth/Sex: Treating RN: 03-22-1961 (60 y.o. [name], [name] Primary Care Aryav Wimberly: Christiana Fuchs Other Clinician: Referring Val Farnam: Treating Ruffin Lada/Extender: Doran Stabler in Treatment: 18 Vital Signs Height(in): 85 Capillary Blood Glucose(mg/dl): 219 Weight(lbs): 301 Pulse(bpm): 23 Body Mass Index(BMI): 40 Blood Pressure(mmHg):  154/87 Temperature(F): 98.6 Respiratory Rate(breaths/min): 17 Photos: [13:No Photos Right, Posterior T Great] [15:No Photos oe Right, Distal T Great oe] [N/A:N/A N/A] Wound Location: [13:Gradually Appeared] [15:Trauma] [N/A:N/A] Wounding Event: [13:Diabetic Wound/Ulcer of the Lower] [15:Diabetic Wound/Ulcer of the Lower] [N/A:N/A] Primary Etiology: [13:Extremity Anemia, Asthma, Hypertension, Type] [15:Extremity Anemia, Asthma, Hypertension, Type] [N/A:N/A] Comorbid History: [13:II Diabetes, Osteomyelitis, Neuropathy, Confinement Anxiety 04/05/2021] [15:II Diabetes, Osteomyelitis, Neuropathy, Confinement Anxiety 08/18/2021] [N/A:N/A] Date Acquired: [13:18] [15:2] [N/A:N/A] Weeks of Treatment: [13:Open] [15:Healed - Epithelialized] [N/A:N/A] Wound Status: [13:0.2x0.3x0.1] [15:0x0x0] [N/A:N/A] Measurements L x W x D (cm) [13:0.047] [15:0] [N/A:N/A] A (cm) : rea [13:0.005] [15:0] [N/A:N/A]  Volume (cm) : [13:0.00%] [15:100.00%] [N/A:N/A] % Reduction in A rea: [13:64.30%] [15:100.00%] [N/A:N/A] % Reduction in Volume: [13:Grade 2] [15:Grade 1] [N/A:N/A] Classification: [13:Small] [15:Small] [N/A:N/A] Exudate A mount: [13:Serosanguineous] [15:Serosanguineous] [N/A:N/A] Exudate Type: [13:red, brown] [15:red, brown] [N/A:N/A] Exudate Color: [13:Well defined, not attached] [15:Flat and Intact] [N/A:N/A] Wound Margin: [13:Large (67-100%)] [15:Large (67-100%)] [N/A:N/A] Granulation A mount: [13:Red] [15:Red] [N/A:N/A] Granulation Quality: [13:None Present (0%)] [15:None Present (0%)] [N/A:N/A] Necrotic A mount: [13:Fat Layer (Subcutaneous Tissue): Yes Fat Layer (Subcutaneous Tissue): Yes N/A] Exposed Structures: [13:Fascia: No Tendon: No Muscle: No Joint: No Bone: No Medium (34-66%)] [15:Fascia: No Tendon: No Muscle: No Joint: No Bone: No Small (1-33%)] [N/A:N/A] Epithelialization: [13:Debridement - Selective/Open Wound N/A] [N/A:N/A] Debridement: Pre-procedure Verification/Time Out 10:18 [15:N/A]  [N/A:N/A] Taken: [13:Lidocaine] [15:N/A] [N/A:N/A] Pain Control: [13:Callus] [15:N/A] [N/A:N/A] Tissue Debrided: [13:Skin/Epidermis] [15:N/A] [N/A:N/A] Level: [13:0.06] [15:N/A] [N/A:N/A] Debridement A (sq cm): [13:rea Curette] [15:N/A] [N/A:N/A] Instrument: [13:Minimum] [15:N/A] [N/A:N/A] Bleeding: [13:Pressure] [15:N/A] [N/A:N/A] Hemostasis A chieved: [13:0] [15:N/A] [N/A:N/A] Procedural Pain: [13:0] [15:N/A] [N/A:N/A] Post Procedural Pain: [13:Procedure was tolerated well] [15:N/A] [N/A:N/A] Debridement Treatment Response: [13:0.2x0.3x0.1] [15:N/A] [N/A:N/A] Post Debridement Measurements L x W x D (cm) [13:0.005] [15:N/A] [N/A:N/A] Post Debridement Volume: (cm) [13:Debridement] [15:N/A] [N/A:N/A] Treatment Notes Wound #13 (Toe Great) Wound Laterality: Right, Posterior Cleanser Wound Cleanser Discharge Instruction: Cleanse the wound with wound cleanser prior to applying a clean dressing using gauze sponges, not tissue or cotton balls. Peri-Wound Care Topical Gentamicin Discharge Instruction: thin layer to wound bed Primary Dressing KerraCel Ag Gelling Fiber Dressing, 4x5 in (silver alginate) Discharge Instruction: Apply silver alginate to wound bed as instructed Secondary Dressing Woven Gauze Sponge, Non-Sterile 4x4 in Discharge Instruction: Apply over primary dressing as directed. Optifoam Non-Adhesive Dressing, 4x4 in Discharge Instruction: **Foam donut***Apply over primary dressing as directed. Secured With Conforming Stretch Gauze Bandage, Sterile 2x75 (in/in) Discharge Instruction: Secure with stretch gauze as directed. 578M Medipore H Soft Cloth Surgical T ape, 2x2 (in/yd) Discharge Instruction: Secure dressing with tape as directed. Compression Wrap Compression Stockings Add-Ons Wound #15 (Toe Great) Wound Laterality: Right, Distal Cleanser Wound Cleanser Discharge Instruction: Cleanse the wound with wound cleanser prior to applying a clean dressing using gauze  sponges, not tissue or cotton balls. Peri-Wound Care Topical Gentamicin Discharge Instruction: thin layer to wound bed Primary Dressing KerraCel Ag Gelling Fiber Dressing, 4x5 in (silver alginate) Discharge Instruction: Apply silver alginate to wound bed as instructed Secondary Dressing Woven Gauze Sponge, Non-Sterile 4x4 in Discharge Instruction: Apply over primary dressing as directed. Optifoam Non-Adhesive Dressing, 4x4 in Discharge Instruction: **Foam donut***Apply over primary dressing as directed. Secured With Conforming Stretch Gauze Bandage, Sterile 2x75 (in/in) Discharge Instruction: Secure with stretch gauze as directed. 578M Medipore H Soft Cloth Surgical T ape, 2x2 (in/yd) Discharge Instruction: Secure dressing with tape as directed. Compression Wrap Compression Stockings Add-Ons Electronic Signature(s) Signed: 09/02/2021 1:23:11 PM By: Kalman Shan DO Signed: 09/07/2021 5:02:53 PM By: Rhae Hammock RN Entered By: Kalman Shan on 09/02/2021 10:33:04 -------------------------------------------------------------------------------- Multi-Disciplinary Care Plan Details Patient Name: Date of Service: [name], [name] 09/02/2021 9:45 A M Medical Record Number: [id number] Patient Account Number: [id number] Date of Birth/Sex: Treating RN: 08/03/1961 (60 y.o. [name] Primary Care Melayna Robarts: Christiana Fuchs Other Clinician: Referring Ashana Tullo: Treating Yarden Manuelito/Extender: Doran Stabler in Treatment: 18 Multidisciplinary Care Plan reviewed with physician Active Inactive Nutrition Nursing Diagnoses: Impaired glucose control: actual or potential Potential for alteratiion in Nutrition/Potential for imbalanced nutrition Goals: Patient/caregiver agrees to and verbalizes understanding of need to obtain nutritional consultation Date  Initiated: 05/06/2021 Date Inactivated: 06/04/2021 Target Resolution Date: 05/27/2021 Goal Status:  Met Patient/caregiver will maintain therapeutic glucose control Date Initiated: 05/06/2021 Target Resolution Date: 09/16/2021 Goal Status: Active Interventions: Assess HgA1c results as ordered upon admission and as needed Provide education on elevated blood sugars and impact on wound healing Provide education on nutrition Treatment Activities: Patient referred to Primary Care Physician for further nutritional evaluation : 05/06/2021 Notes: Wound/Skin Impairment Nursing Diagnoses: Knowledge deficit related to ulceration/compromised skin integrity Goals: Patient/caregiver will verbalize understanding of skin care regimen Date Initiated: 05/06/2021 Target Resolution Date: 09/16/2021 Goal Status: Active Interventions: Assess patient/caregiver ability to obtain necessary supplies Assess patient/caregiver ability to perform ulcer/skin care regimen upon admission and as needed Provide education on ulcer and skin care Treatment Activities: Skin care regimen initiated : 05/06/2021 Topical wound management initiated : 05/06/2021 Notes: Electronic Signature(s) Signed: 09/07/2021 5:02:53 PM By: Rhae Hammock RN Entered By: Rhae Hammock on 09/02/2021 10:12:10 -------------------------------------------------------------------------------- Pain Assessment Details Patient Name: Date of Service: [name], [place] 09/02/2021 9:45 A M Medical Record Number: [id number] Patient Account Number: [id number] Date of Birth/Sex: Treating RN: 03/04/1961 (60 y.o. [name] Primary Care Kessie Croston: Christiana Fuchs Other Clinician: Referring Airica Schwartzkopf: Treating Courage Biglow/Extender: Doran Stabler in Treatment: 18 Active Problems Location of Pain Severity and Description of Pain Patient Has Paino No Site Locations Pain Management and Medication Current Pain Management: Electronic Signature(s) Signed: 09/02/2021 5:58:36 PM By: Lorrin Jackson Entered By: Lorrin Jackson on  09/02/2021 10:00:24 -------------------------------------------------------------------------------- Patient/Caregiver Education Details Patient Name: Date of Service: [name], N[place] 10/6/2022andnbsp9:45 [name] Record Number: [id number] Patient Account Number: [id number] Date of Birth/Gender: Treating RN: [birth year] (60 y.o. [name] Primary Care Physician: Christiana Fuchs Other Clinician: Referring Physician: Treating Physician/Extender: Doran Stabler in Treatment: 18 Education Assessment Education Provided To: Patient Education Topics Provided Elevated Blood Sugar/ Impact on Healing: Methods: Explain/Verbal Responses: State content correctly Nutrition: Methods: Explain/Verbal Responses: State content correctly Wound/Skin Impairment: Methods: Explain/Verbal Responses: State content correctly Electronic Signature(s) Signed: 09/07/2021 5:02:53 PM By: Rhae Hammock RN Entered By: Rhae Hammock on 09/02/2021 10:12:43 -------------------------------------------------------------------------------- Wound Assessment Details Patient Name: Date of Service: [name], [name] 09/02/2021 9:45 A M Medical Record Number: [id number] Patient Account Number: [id number] Date of Birth/Sex: Treating RN: June 20, 1961 (60 y.o. [name] Primary Care Victor Langenbach: Christiana Fuchs Other Clinician: Referring Enya Bureau: Treating Oval Cavazos/Extender: Doran Stabler in Treatment: 18 Wound Status Wound Number: 13 Primary Diabetic Wound/Ulcer of the Lower Extremity Etiology: Wound Location: Right, Posterior T Great oe Wound Open Wounding Event: Gradually Appeared Status: Date Acquired: 04/05/2021 Comorbid Anemia, Asthma, Hypertension, Type II Diabetes, Osteomyelitis, Weeks Of Treatment: 18 History: Neuropathy, Confinement Anxiety Clustered Wound: No Wound Measurements Length: (cm) 0.2 Width: (cm) 0.3 Depth: (cm)  0.1 Area: (cm) 0.047 Volume: (cm) 0.005 % Reduction in Area: 0% % Reduction in Volume: 64.3% Epithelialization: Medium (34-66%) Tunneling: No Undermining: No Wound Description Classification: Grade 2 Wound Margin: Well defined, not attached Exudate Amount: Small Exudate Type: Serosanguineous Exudate Color: red, brown Foul Odor After Cleansing: No Slough/Fibrino No Wound Bed Granulation Amount: Large (67-100%) Exposed Structure Granulation Quality: Red Fascia Exposed: No Necrotic Amount: None Present (0%) Fat Layer (Subcutaneous Tissue) Exposed: Yes Tendon Exposed: No Muscle Exposed: No Joint Exposed: No Bone Exposed: No Treatment Notes Wound #13 (Toe Great) Wound Laterality: Right, Posterior Cleanser Wound Cleanser Discharge Instruction: Cleanse the wound with wound cleanser prior to applying a clean dressing using gauze sponges, not tissue or cotton  balls. Peri-Wound Care Topical Gentamicin Discharge Instruction: thin layer to wound bed Primary Dressing KerraCel Ag Gelling Fiber Dressing, 4x5 in (silver alginate) Discharge Instruction: Apply silver alginate to wound bed as instructed Secondary Dressing Woven Gauze Sponge, Non-Sterile 4x4 in Discharge Instruction: Apply over primary dressing as directed. Optifoam Non-Adhesive Dressing, 4x4 in Discharge Instruction: **Foam donut***Apply over primary dressing as directed. Secured With Conforming Stretch Gauze Bandage, Sterile 2x75 (in/in) Discharge Instruction: Secure with stretch gauze as directed. 89M Medipore H Soft Cloth Surgical T ape, 2x2 (in/yd) Discharge Instruction: Secure dressing with tape as directed. Compression Wrap Compression Stockings Add-Ons Electronic Signature(s) Signed: 09/02/2021 5:58:36 PM By: Lorrin Jackson Entered By: Lorrin Jackson on 09/02/2021 10:06:40 -------------------------------------------------------------------------------- Wound Assessment Details Patient Name: Date of  Service: [name], [name] 09/02/2021 9:45 A M Medical Record Number: [id number] Patient Account Number: [id number] Date of Birth/Sex: Treating RN: 09-07-1961 (60 y.o. [name], [name] Primary Care Renu Asby: Christiana Fuchs Other Clinician: Referring Markiah Janeway: Treating Angelia Hazell/Extender: Doran Stabler in Treatment: 18 Wound Status Wound Number: 15 Primary Diabetic Wound/Ulcer of the Lower Extremity Etiology: Wound Location: Right, Distal T Great oe Wound Healed - Epithelialized Wounding Event: Trauma Status: Date Acquired: 08/18/2021 Comorbid Anemia, Asthma, Hypertension, Type II Diabetes, Osteomyelitis, Weeks Of Treatment: 2 History: Neuropathy, Confinement Anxiety Clustered Wound: No Wound Measurements Length: (cm) Width: (cm) Depth: (cm) Area: (cm) Volume: (cm) 0 % Reduction in Area: 100% 0 % Reduction in Volume: 100% 0 Epithelialization: Small (1-33%) 0 Tunneling: No 0 Undermining: No Wound Description Classification: Grade 1 Wound Margin: Flat and Intact Exudate Amount: Small Exudate Type: Serosanguineous Exudate Color: red, brown Foul Odor After Cleansing: No Slough/Fibrino No Wound Bed Granulation Amount: Large (67-100%) Exposed Structure Granulation Quality: Red Fascia Exposed: No Necrotic Amount: None Present (0%) Fat Layer (Subcutaneous Tissue) Exposed: Yes Tendon Exposed: No Muscle Exposed: No Joint Exposed: No Bone Exposed: No Treatment Notes Wound #15 (Toe Great) Wound Laterality: Right, Distal Cleanser Wound Cleanser Discharge Instruction: Cleanse the wound with wound cleanser prior to applying a clean dressing using gauze sponges, not tissue or cotton balls. Peri-Wound Care Topical Gentamicin Discharge Instruction: thin layer to wound bed Primary Dressing KerraCel Ag Gelling Fiber Dressing, 4x5 in (silver alginate) Discharge Instruction: Apply silver alginate to wound bed as instructed Secondary Dressing Woven  Gauze Sponge, Non-Sterile 4x4 in Discharge Instruction: Apply over primary dressing as directed. Optifoam Non-Adhesive Dressing, 4x4 in Discharge Instruction: **Foam donut***Apply over primary dressing as directed. Secured With Conforming Stretch Gauze Bandage, Sterile 2x75 (in/in) Discharge Instruction: Secure with stretch gauze as directed. 89M Medipore H Soft Cloth Surgical T ape, 2x2 (in/yd) Discharge Instruction: Secure dressing with tape as directed. Compression Wrap Compression Stockings Add-Ons Electronic Signature(s) Signed: 09/07/2021 5:02:53 PM By: Rhae Hammock RN Entered By: Rhae Hammock on 09/02/2021 10:16:08 -------------------------------------------------------------------------------- Vitals Details Patient Name: Date of Service: [name], [place] 09/02/2021 9:45 A M Medical Record Number: [id number] Patient Account Number: [id number] Date of Birth/Sex: Treating RN: December 24, 1960 (60 y.o. [name] Primary Care Adeliz Tonkinson: Christiana Fuchs Other Clinician: Referring Nyara Capell: Treating Ebonique Hallstrom/Extender: Doran Stabler in Treatment: 18 Vital Signs Time Taken: 09:58 Temperature (F): 98.6 Height (in): 73 Pulse (bpm): 98 Weight (lbs): 301 Respiratory Rate (breaths/min): 17 Body Mass Index (BMI): 39.7 Blood Pressure (mmHg): 154/87 Capillary Blood Glucose (mg/dl): 219 Reference Range: 80 - 120 mg / dl Electronic Signature(s) Signed: 09/02/2021 5:58:36 PM By: Lorrin Jackson Entered By: Lorrin Jackson on 09/02/2021 10:00:16

## 2021-09-07 NOTE — Progress Notes (Signed)
[name], [name] ([id number]) Visit Report for 09/02/2021 Chief Complaint Document Details Patient Name: Date of Service: [name], [name] 09/02/2021 9:45 A M Medical Record Number: [id number] Patient Account Number: [id number] Date of Birth/Sex: Treating RN: 1961/07/08 (60 y.o. [name] Primary Care Provider: Christiana Rivera Other Clinician: Referring Provider: Treating Provider/Extender: [name] in Treatment: 18 Information Obtained from: Patient Chief Complaint Right great toe wound Electronic Signature(s) Signed: 09/02/2021 1:23:11 PM By: Debra Shan DO Entered By: [name] on 09/02/2021 10:34:31 -------------------------------------------------------------------------------- Debridement Details Patient Name: Date of Service: [name], [name] 09/02/2021 9:45 A M Medical Record Number: [id number] Patient Account Number: [id number] Date of Birth/Sex: Treating RN: October 18, 1961 (60 y.o. [name], [name] Primary Care Provider: Christiana Rivera Other Clinician: Referring Provider: Treating Provider/Extender: [name] in Treatment: 18 Debridement Performed for Assessment: Wound #13 Right,Posterior T Great oe Performed By: Physician Debra Shan, DO Debridement Type: Debridement Severity of Tissue Pre Debridement: Fat layer exposed Level of Consciousness (Pre-procedure): Awake and Alert Pre-procedure Verification/Time Out Yes - 10:18 Taken: Start Time: 10:18 Pain Control: Lidocaine T Area Debrided (L x W): otal 0.2 (cm) x 0.3 (cm) = 0.06 (cm) Tissue and other material debrided: Viable, Non-Viable, Callus, Skin: Dermis , Skin: Epidermis Level: Skin/Epidermis Debridement Description: Selective/Open Wound Instrument: Curette Bleeding: Minimum Hemostasis Achieved: Pressure End Time: 10:18 Procedural Pain: 0 Post Procedural Pain: 0 Response to Treatment: Procedure was tolerated well Level of  Consciousness (Post- Awake and Alert procedure): Post Debridement Measurements of Total Wound Length: (cm) 0.2 Width: (cm) 0.3 Depth: (cm) 0.1 Volume: (cm) 0.005 Character of Wound/Ulcer Post Debridement: Improved Severity of Tissue Post Debridement: Fat layer exposed Post Procedure Diagnosis Same as Pre-procedure Electronic Signature(s) Signed: 09/02/2021 1:23:11 PM By: Debra Shan DO Signed: 09/07/2021 5:02:53 PM By: Debra Hammock RN Entered By: [name] on 09/02/2021 10:19:27 -------------------------------------------------------------------------------- HPI Details Patient Name: Date of Service: [name], [name] 09/02/2021 9:45 A M Medical Record Number: [id number] Patient Account Number: [id number] Date of Birth/Sex: Treating RN: 1961-07-26 (60 y.o. [name] Primary Care Provider: Christiana Rivera Other Clinician: Referring Provider: Treating Provider/Extender: [name] in Treatment: 18 History of Present Illness Location: notice some discharge and callus on the right big toe Quality: Patient reports experiencing a dull pain to affected area(s). Severity: Patient states wound(s) are getting worse. Duration: Patient has had the wound for < 2 weeks prior to presenting for treatment Timing: Pain in wound is Intermittent (comes and goes Context: The wound occurred when the patient was cutting her toenails and noticed some discharge from this area and was very concerned about it. Modifying Factors: Patient wound(s)/ulcer(s) are improving due to:he has seen her PCP who put her on Bactrim and Keflex antibiotic ssociated Signs and Symptoms: Patient reports having decrease discharge she started on her antibiotic. A HPI Description: [name] is a 60 year old female with a past medical history of uncontrolled type 2 diabetes on insulin, osteomyelitis with amputations of 1st and 2nd toes of her left foot that presents to  clinic for wound care to the right great toe wound. She states she noticed the wound 3 weeks ago. Not quite sure how it started. She has been using silver alginate to the area. She was recently hospitalized from a clinic visit in the internal medicine clinic for this issue. While hospitalized she had an MRI of the right foot that showed likely early acute osteomyelitis with possible septic arthritis. She was discharged on  ciprofloxacin for 28 days. She currently does not deny pain increased warmth or erythema to the area. She has some drainage but not purulent. 6/9; patient presents for 1 week follow-up. She has no complaints today and denies signs of infection. She missed her infectious disease appointment yesterday but has it rescheduled for the 21st. She has been using silver alginate to the wound. 6/23; patient presents for 2-week follow-up. She followed up with Dr. Megan Rivera, infectious disease 2 days ago. She had some blood work done and was told to finish ciprofloxacin. She is having a follow-up virtual encounter next week. There has been an increase in callus development. She is unable to use her offloading shoe due to having to walk for long periods due to needing public transportation. She continues to use silver alginate to the wound. She denies signs of infection. 7/8; patient presents for follow-up. She missed her appointment last week. She reports increased callus to the surrounding wound area. She continues to use silver alginate. She denies signs of infection. 7/14; patient presents for 1 week follow-up. She has been using silver alginate to the wound bed. She has no issues or complaints today. She denies signs of infection. 7/28; patient presents for 2-week follow-up. She has been using silver alginate to the wound bed. She has been moving to a new location over the past week. She has no issues or complaints today. She denies signs of infection. 8/18; patient presents for follow-up.  She was recently hospitalized for COVID and has not been able to follow-up for wound care. She was last seen 3 weeks ago. She reports receiving wound care while hospitalized. Since discharge of the hospital she is reported more drainage from the wound site. She denies signs of infection. 8/25; patient presents for follow-up. She has no issues or complaints today. She has been using silver alginate daily to the wound bed. She denies signs of infection. 9/15; patient presents for follow-up. She missed her last clinic appointment. She reports using silver alginate to the wound bed. She denies signs of infection. Due to transportation she has to walk for long periods of time. It is hard for the patient to offload this area well. 9/22; patient presents for follow-up. She has been using silver alginate to the wound bed. She reports a new wound to the distal portion of her right great toe. She states she stopped this last week. She has started Keflex for a wound culture done at last clinic visit. Due to cost she is not able to use the gentamicin cream prescribed. She states she is going to try different pharmacy to see if she can obtain this 10/6; patient presents for follow-up. She has been using gentamicin cream and silver alginate to the wound beds. The most distal portion of the right great toe has healed. She has no issues or complaints today. She denies signs of infection. Electronic Signature(s) Signed: 09/02/2021 1:23:11 PM By: Debra Shan DO Signed: 09/02/2021 1:23:11 PM By: Debra Shan DO Entered By: [name] on 09/02/2021 10:35:06 -------------------------------------------------------------------------------- Physical Exam Details Patient Name: Date of Service: [name], [name] 09/02/2021 9:45 A M Medical Record Number: [id number] Patient Account Number: [id number] Date of Birth/Sex: Treating RN: 02-Oct-1961 (60 y.o. [name] Primary Care Provider: Christiana Rivera  Other Clinician: Referring Provider: Treating Provider/Extender: [name] in Treatment: 18 Constitutional respirations regular, non-labored and within target range for patient.. Cardiovascular 2+ dorsalis pedis/posterior tibialis pulses. Psychiatric pleasant and cooperative. Notes Right great toe:  T the distal aspect there is callus to the previous wound site. No drainage noted. No fluctuance. T the posterior aspect there is an open o o wound with granulation tissue and nonviable tissue present. No signs of obvious soft tissue infection. Electronic Signature(s) Signed: 09/02/2021 1:23:11 PM By: Debra Shan DO Entered By: [name] on 09/02/2021 10:36:02 -------------------------------------------------------------------------------- Physician Orders Details Patient Name: Date of Service: [name], [name] 09/02/2021 9:45 A M Medical Record Number: [id number] Patient Account Number: [id number] Date of Birth/Sex: Treating RN: [birth year] (60 y.o. [name], [name] Primary Care Provider: Christiana Rivera Other Clinician: Referring Provider: Treating Provider/Extender: [name] in Treatment: 18 Verbal / Phone Orders: No Diagnosis Coding ICD-10 Coding Code Description E11.621 Type 2 diabetes mellitus with foot ulcer M86.171 Other acute osteomyelitis, right ankle and foot L97.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 exposed E11.65 Type 2 diabetes mellitus with hyperglycemia Follow-up Appointments ppointment in 2 weeks. - Dr. Heber [place] Return A Bathing/ Shower/ Hygiene May shower with protection but do not get wound dressing(s) wet. - May use cast wrap for protection Edema Control - Lymphedema / SCD / Other Elevate legs to the level of the heart or above for 30 minutes daily and/or when sitting, a frequency of: - 3-4 times throughout the day. Avoid standing for long periods of  time. Off-Loading Wedge shoe to: - Patient to wear while standing and walking. Wound Treatment Wound #13 - T Great oe Wound Laterality: Right, Posterior Cleanser: Wound Cleanser (Generic) 1 x Per Day/15 Days Discharge Instructions: Cleanse the wound with wound cleanser prior to applying a clean dressing using gauze sponges, not tissue or cotton balls. Topical: Gentamicin 1 x Per Day/15 Days Discharge Instructions: thin layer to wound bed Prim Dressing: KerraCel Ag Gelling Fiber Dressing, 4x5 in (silver alginate) (Generic) 1 x Per Day/15 Days ary Discharge Instructions: Apply silver alginate to wound bed as instructed Secondary Dressing: Woven Gauze Sponge, Non-Sterile 4x4 in (Generic) 1 x Per Day/15 Days Discharge Instructions: Apply over primary dressing as directed. Secondary Dressing: Optifoam Non-Adhesive Dressing, 4x4 in (Generic) 1 x Per Day/15 Days Discharge Instructions: **Foam donut***Apply over primary dressing as directed. Secured With: Child psychotherapist, Sterile 2x75 (in/in) (Generic) 1 x Per Day/15 Days Discharge Instructions: Secure with stretch gauze as directed. Secured With: 89M Medipore H Soft Cloth Surgical Tape, 2x2 (in/yd) (Generic) 1 x Per Day/15 Days Discharge Instructions: Secure dressing with tape as directed. Electronic Signature(s) Signed: 09/02/2021 1:23:11 PM By: Debra Shan DO Entered By: [name] on 09/02/2021 10:36:57 -------------------------------------------------------------------------------- Problem List Details Patient Name: Date of Service: [name], [name] 09/02/2021 9:45 A M Medical Record Number: [id number] Patient Account Number: [id number] Date of Birth/Sex: Treating RN: 1961/02/28 (60 y.o. [name] Primary Care Provider: Christiana Rivera Other Clinician: Referring Provider: Treating Provider/Extender: [name] in Treatment: 18 Active Problems ICD-10 Encounter Code  Description Active Date MDM Diagnosis E11.621 Type 2 diabetes mellitus with foot ulcer 04/29/2021 No Yes M86.171 Other acute osteomyelitis, right ankle and foot 04/29/2021 No Yes L97.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 exposed 04/29/2021 No Yes E11.65 Type 2 diabetes mellitus with hyperglycemia 04/29/2021 No Yes Inactive Problems Resolved Problems Electronic Signature(s) Signed: 09/02/2021 1:23:11 PM By: Debra Shan DO Entered By: [name] on 09/02/2021 10:32:58 -------------------------------------------------------------------------------- Progress Note/History and Physical Details Patient Name: Date of Service: [name], [name] 09/02/2021 9:45 A M Medical Record Number:  [id number] Patient Account Number: [id number] Date of Birth/Sex: Treating RN: 1961-11-14 (60 y.o. [name] Primary Care Provider: Christiana Rivera Other Clinician: Referring Provider: Treating Provider/Extender: [name] in Treatment: 18 Subjective Chief Complaint Information obtained from Patient Right great toe wound History of Present Illness (HPI) The following HPI elements were documented for the patient's wound: Location: notice some discharge and callus on the right big toe Quality: Patient reports experiencing a dull pain to affected area(s). Severity: Patient states wound(s) are getting worse. Duration: Patient has had the wound for < 2 weeks prior to presenting for treatment Timing: Pain in wound is Intermittent (comes and goes Context: The wound occurred when the patient was cutting her toenails and noticed some discharge from this area and was very concerned about it. Modifying Factors: Patient wound(s)/ulcer(s) are improving due to:he has seen her PCP who put her on Bactrim and Keflex antibiotic Associated Signs and Symptoms: Patient reports having decrease discharge she started on her antibiotic. Ms. Keyle Doby is a  60 year old female with a past medical history of uncontrolled type 2 diabetes on insulin, osteomyelitis with amputations of 1st and 2nd toes of her left foot that presents to clinic for wound care to the right great toe wound. She states she noticed the wound 3 weeks ago. Not quite sure how it started. She has been using silver alginate to the area. She was recently hospitalized from a clinic visit in the internal medicine clinic for this issue. While hospitalized she had an MRI of the right foot that showed likely early acute osteomyelitis with possible septic arthritis. She was discharged on ciprofloxacin for 28 days. She currently does not deny pain increased warmth or erythema to the area. She has some drainage but not purulent. 6/9; patient presents for 1 week follow-up. She has no complaints today and denies signs of infection. She missed her infectious disease appointment yesterday but has it rescheduled for the 21st. She has been using silver alginate to the wound. 6/23; patient presents for 2-week follow-up. She followed up with Dr. Megan Rivera, infectious disease 2 days ago. She had some blood work done and was told to finish ciprofloxacin. She is having a follow-up virtual encounter next week. There has been an increase in callus development. She is unable to use her offloading shoe due to having to walk for long periods due to needing public transportation. She continues to use silver alginate to the wound. She denies signs of infection. 7/8; patient presents for follow-up. She missed her appointment last week. She reports increased callus to the surrounding wound area. She continues to use silver alginate. She denies signs of infection. 7/14; patient presents for 1 week follow-up. She has been using silver alginate to the wound bed. She has no issues or complaints today. She denies signs of infection. 7/28; patient presents for 2-week follow-up. She has been using silver alginate to the  wound bed. She has been moving to a new location over the past week. She has no issues or complaints today. She denies signs of infection. 8/18; patient presents for follow-up. She was recently hospitalized for COVID and has not been able to follow-up for wound care. She was last seen 3 weeks ago. She reports receiving wound care while hospitalized. Since discharge of the hospital she is reported more drainage from the wound site. She denies signs of infection. 8/25; patient presents for follow-up. She has no issues or complaints today. She has been using silver alginate daily to  the wound bed. She denies signs of infection. 9/15; patient presents for follow-up. She missed her last clinic appointment. She reports using silver alginate to the wound bed. She denies signs of infection. Due to transportation she has to walk for long periods of time. It is hard for the patient to offload this area well. 9/22; patient presents for follow-up. She has been using silver alginate to the wound bed. She reports a new wound to the distal portion of her right great toe. She states she stopped this last week. She has started Keflex for a wound culture done at last clinic visit. Due to cost she is not able to use the gentamicin cream prescribed. She states she is going to try different pharmacy to see if she can obtain this 10/6; patient presents for follow-up. She has been using gentamicin cream and silver alginate to the wound beds. The most distal portion of the right great toe has healed. She has no issues or complaints today. She denies signs of infection. Patient History Information obtained from Patient. Family History Diabetes - Father,Child, Hypertension - Mother,Father, No family history of Cancer, Heart Disease, Hereditary Spherocytosis, Kidney Disease, Lung Disease, Seizures, Stroke, Thyroid Problems, Tuberculosis. Social History Never smoker, Marital Status - Divorced, Alcohol Use - Never, Drug Use  - No History, Caffeine Use - Rarely - soda. Medical History Eyes Denies history of Cataracts, Glaucoma, Optic Neuritis Ear/Nose/Mouth/Throat Denies history of Chronic sinus problems/congestion, Middle ear problems Hematologic/Lymphatic Patient has history of Anemia Denies history of Hemophilia, Human Immunodeficiency Virus, Lymphedema, Sickle Cell Disease Respiratory Patient has history of Asthma Denies history of Aspiration, Chronic Obstructive Pulmonary Disease (COPD), Pneumothorax, Sleep Apnea, Tuberculosis Cardiovascular Patient has history of Hypertension Denies history of Angina, Arrhythmia, Congestive Heart Failure, Coronary Artery Disease, Deep Vein Thrombosis, Hypotension, Myocardial Infarction, Peripheral Arterial Disease, Peripheral Venous Disease, Phlebitis, Vasculitis Gastrointestinal Denies history of Cirrhosis , Colitis, Crohnoos, Hepatitis A, Hepatitis B, Hepatitis C Endocrine Patient has history of Type II Diabetes Denies history of Type I Diabetes Genitourinary Denies history of End Stage Renal Disease Immunological Denies history of Lupus Erythematosus, Raynaudoos, Scleroderma Integumentary (Skin) Denies history of History of Burn Musculoskeletal Patient has history of Osteomyelitis - hx left great toe Denies history of Gout, Rheumatoid Arthritis, Osteoarthritis Neurologic Patient has history of Neuropathy Denies history of Dementia, Quadriplegia, Paraplegia, Seizure Disorder Oncologic Denies history of Received Chemotherapy, Received Radiation Psychiatric Patient has history of Confinement Anxiety Denies history of Anorexia/bulimia Patient is treated with Insulin, Oral Agents. Blood sugar is tested. Blood sugar results noted at the following times: Lunch - 120-160. Hospitalization/Surgery History - left second toe amputation. - left great toe amputation. - c-section x4. Medical A Surgical History  Notes nd Gastrointestinal GERD Objective Constitutional respirations regular, non-labored and within target range for patient.. Vitals Time Taken: 9:58 AM, Height: 73 in, Weight: 301 lbs, BMI: 39.7, Temperature: 98.6 F, Pulse: 98 bpm, Respiratory Rate: 17 breaths/min, Blood Pressure: 154/87 mmHg, Capillary Blood Glucose: 219 mg/dl. Cardiovascular 2+ dorsalis pedis/posterior tibialis pulses. Psychiatric pleasant and cooperative. General Notes: Right great toe: T the distal aspect there is callus to the previous wound site. No drainage noted. No fluctuance. T the posterior aspect there o o is an open wound with granulation tissue and nonviable tissue present. No signs of obvious soft tissue infection. Integumentary (Hair, Skin) Wound #13 status is Open. Original cause of wound was Gradually Appeared. The date acquired was: 04/05/2021. The wound has been in treatment 18 weeks. The wound is located on  the [place]. The wound measures 0.2cm length x 0.3cm width x 0.1cm depth; 0.047cm^2 area and 0.005cm^3 oe volume. There is Fat Layer (Subcutaneous Tissue) exposed. There is no tunneling or undermining noted. There is a small amount of serosanguineous drainage noted. The wound margin is well defined and not attached to the wound base. There is large (67-100%) red granulation within the wound bed. There is no necrotic tissue within the wound bed. Wound #15 status is Healed - Epithelialized. Original cause of wound was Trauma. The date acquired was: 08/18/2021. The wound has been in treatment 2 weeks. The wound is located on the Right,Distal T [place]. The wound measures 0cm length x 0cm width x 0cm depth; 0cm^2 area and 0cm^3 volume. There is oe Fat Layer (Subcutaneous Tissue) exposed. There is no tunneling or undermining noted. There is a small amount of serosanguineous drainage noted. The wound margin is flat and intact. There is large (67-100%) red granulation within the wound bed.  There is no necrotic tissue within the wound bed. Assessment Active Problems ICD-10 Type 2 diabetes mellitus with foot ulcer Other acute osteomyelitis, right ankle and foot Non-pressure chronic ulcer of other part of right foot with fat layer exposed Type 2 diabetes mellitus with hyperglycemia The distal great toe wound has healed. The plantar toe wound has improved in size and appearance since last clinic visit. I debrided nonviable tissue. I recommended continuing gentamicin and silver alginate to this area. We discussed continued offloading and importance of glycemic control for wound healing. No obvious signs of infection on exam. Procedures Wound #13 Pre-procedure diagnosis of Wound #13 is a Diabetic Wound/Ulcer of the Lower Extremity located on the [place] .Severity of Tissue Pre oe Debridement is: Fat layer exposed. There was a Selective/Open Wound Skin/Epidermis Debridement with a total area of 0.06 sq cm performed by Debra Shan, DO. With the following instrument(s): Curette to remove Viable and Non-Viable tissue/material. Material removed includes Callus, Skin: Dermis, and Skin: Epidermis after achieving pain control using Lidocaine. No specimens were taken. A time out was conducted at 10:18, prior to the start of the procedure. A Minimum amount of bleeding was controlled with Pressure. The procedure was tolerated well with a pain level of 0 throughout and a pain level of 0 following the procedure. Post Debridement Measurements: 0.2cm length x 0.3cm width x 0.1cm depth; 0.005cm^3 volume. Character of Wound/Ulcer Post Debridement is improved. Severity of Tissue Post Debridement is: Fat layer exposed. Post procedure Diagnosis Wound #13: Same as Pre-Procedure Plan Follow-up Appointments: Return Appointment in 2 weeks. - Dr. Heber [employer] Bathing/ Shower/ Hygiene: May shower with protection but do not get wound dressing(s) wet. - May use cast wrap for protection Edema  Control - Lymphedema / SCD / Other: Elevate legs to the level of the heart or above for 30 minutes daily and/or when sitting, a frequency of: - 3-4 times throughout the day. Avoid standing for long periods of time. Off-Loading: Wedge shoe to: - Patient to wear while standing and walking. WOUND #13: - T Great Wound Laterality: Right, Posterior oe Cleanser: Wound Cleanser (Generic) 1 x Per Day/15 Days Discharge Instructions: Cleanse the wound with wound cleanser prior to applying a clean dressing using gauze sponges, not tissue or cotton balls. Topical: Gentamicin 1 x Per Day/15 Days Discharge Instructions: thin layer to wound bed Prim Dressing: KerraCel Ag Gelling Fiber Dressing, 4x5 in (silver alginate) (Generic) 1 x Per Day/15 Days ary Discharge Instructions: Apply silver alginate to wound bed as  instructed Secondary Dressing: Woven Gauze Sponge, Non-Sterile 4x4 in (Generic) 1 x Per Day/15 Days Discharge Instructions: Apply over primary dressing as directed. Secondary Dressing: Optifoam Non-Adhesive Dressing, 4x4 in (Generic) 1 x Per Day/15 Days Discharge Instructions: **Foam donut***Apply over primary dressing as directed. Secured With: Child psychotherapist, Sterile 2x75 (in/in) (Generic) 1 x Per Day/15 Days Discharge Instructions: Secure with stretch gauze as directed. Secured With: 63M Medipore H Soft Cloth Surgical T ape, 2x2 (in/yd) (Generic) 1 x Per Day/15 Days Discharge Instructions: Secure dressing with tape as directed. 1. Silver alginate and gentamicin 2. Follow-up in 2 weeks Electronic Signature(s) Signed: 09/02/2021 1:23:11 PM By: Debra Shan DO Entered By: [name] on 09/02/2021 10:39:22 -------------------------------------------------------------------------------- HxROS Details Patient Name: Date of Service: [name], [name] 09/02/2021 9:45 A M Medical Record Number: [id number] Patient Account Number: [id number] Date of Birth/Sex: Treating  RN: 12/16/1960 (60 y.o. [name] Primary Care Provider: Christiana Rivera Other Clinician: Referring Provider: Treating Provider/Extender: [name] in Treatment: 18 Label Progress Note Print Version as History and Physical for this encounter Information Obtained From Patient Eyes Medical History: Negative for: Cataracts; Glaucoma; Optic Neuritis Ear/Nose/Mouth/Throat Medical History: Negative for: Chronic sinus problems/congestion; Middle ear problems Hematologic/Lymphatic Medical History: Positive for: Anemia Negative for: Hemophilia; Human Immunodeficiency Virus; Lymphedema; Sickle Cell Disease Respiratory Medical History: Positive for: Asthma Negative for: Aspiration; Chronic Obstructive Pulmonary Disease (COPD); Pneumothorax; Sleep Apnea; Tuberculosis Cardiovascular Medical History: Positive for: Hypertension Negative for: Angina; Arrhythmia; Congestive Heart Failure; Coronary Artery Disease; Deep Vein Thrombosis; Hypotension; Myocardial Infarction; Peripheral Arterial Disease; Peripheral Venous Disease; Phlebitis; Vasculitis Gastrointestinal Medical History: Negative for: Cirrhosis ; Colitis; Crohns; Hepatitis A; Hepatitis B; Hepatitis C Past Medical History Notes: GERD Endocrine Medical History: Positive for: Type II Diabetes Negative for: Type I Diabetes Time with diabetes: 47 years Treated with: Insulin, Oral agents Blood sugar tested every day: Yes Tested : 2 times per day Blood sugar testing results: Lunch: 120-160 Genitourinary Medical History: Negative for: End Stage Renal Disease Immunological Medical History: Negative for: Lupus Erythematosus; Raynauds; Scleroderma Integumentary (Skin) Medical History: Negative for: History of Burn Musculoskeletal Medical History: Positive for: Osteomyelitis - hx left great toe Negative for: Gout; Rheumatoid Arthritis; Osteoarthritis Neurologic Medical History: Positive  for: Neuropathy Negative for: Dementia; Quadriplegia; Paraplegia; Seizure Disorder Oncologic Medical History: Negative for: Received Chemotherapy; Received Radiation Psychiatric Medical History: Positive for: Confinement Anxiety Negative for: Anorexia/bulimia Immunizations Pneumococcal Vaccine: Received Pneumococcal Vaccination: Yes Received Pneumococcal Vaccination On or After 60th Birthday: No Immunization Notes: pt. 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 c-section x4 Family and Social History Cancer: No; Diabetes: Yes - Father,Child; Heart Disease: No; Hereditary Spherocytosis: No; Hypertension: Yes - Mother,Father; Kidney Disease: No; Lung Disease: No; Seizures: No; Stroke: No; Thyroid Problems: No; Tuberculosis: No; Never smoker; Marital Status - Divorced; Alcohol Use: Never; Drug Use: No History; Caffeine Use: Rarely - soda; Financial Concerns: No; Food, Clothing or Shelter Needs: No; Support System Lacking: No; Transportation Concerns: No Electronic Signature(s) Signed: 09/02/2021 1:23:11 PM By: Debra Shan DO Signed: 09/07/2021 5:02:53 PM By: Debra Hammock RN Entered By: [name] on 09/02/2021 10:35:13 -------------------------------------------------------------------------------- SuperBill Details Patient Name: Date of Service: [name], [name] 09/02/2021 Medical Record Number: [id number] Patient Account Number: [id number] Date of Birth/Sex: Treating RN: 05-Aug-1961 (60 y.o. [name] Primary Care Provider: Christiana Rivera Other Clinician: Referring Provider: Treating Provider/Extender: [name] in Treatment: 18 Diagnosis  Coding ICD-10 Codes Code Description E11.621 Type 2 diabetes mellitus with foot ulcer M86.171 Other acute osteomyelitis, right ankle and foot L97.512 Non-pressure chronic ulcer  of other part of right foot with fat layer exposed E11.65 Type 2 diabetes mellitus with hyperglycemia Facility Procedures CPT4 Code: [id number] Description: [phone] - DEBRIDE WOUND 1ST 20 SQ CM OR < ICD-10 Diagnosis Description E11.621 Type 2 diabetes mellitus with foot ulcer L97.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 exposed M86.171 Other acute osteomyelitis,  right ankle and foot E11.65 Type 2 diabetes mellitus with hyperglycemia Modifier: Quantity: 1 Physician Procedures : CPT4 Code Description Modifier [id number] - WC PHYS DEBR WO ANESTH 20 SQ CM ICD-10 Diagnosis Description E11.621 Type 2 diabetes mellitus with foot ulcer L97.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 exposed M86.171  Other acute osteomyelitis, right ankle and foot E11.65 Type 2 diabetes mellitus with hyperglycemia Quantity: 1 Electronic Signature(s) Signed: 09/02/2021 1:23:11 PM By: Debra Shan DO Entered By: [name] on 09/02/2021 10:40:00

## 2021-09-08 ENCOUNTER — Other Ambulatory Visit: Payer: Self-pay | Admitting: Internal Medicine

## 2021-09-08 ENCOUNTER — Ambulatory Visit: Payer: Medicaid Other | Admitting: Student

## 2021-09-08 ENCOUNTER — Other Ambulatory Visit: Payer: Self-pay

## 2021-09-08 ENCOUNTER — Encounter: Payer: Self-pay | Admitting: Student

## 2021-09-08 ENCOUNTER — Other Ambulatory Visit (HOSPITAL_COMMUNITY): Payer: Self-pay

## 2021-09-08 VITALS — BP 169/86 | HR 101 | Temp 98.0°F | Ht 73.0 in | Wt 300.5 lb

## 2021-09-08 DIAGNOSIS — M869 Osteomyelitis, unspecified: Secondary | ICD-10-CM

## 2021-09-08 DIAGNOSIS — E1169 Type 2 diabetes mellitus with other specified complication: Secondary | ICD-10-CM | POA: Diagnosis not present

## 2021-09-08 DIAGNOSIS — Z0000 Encounter for general adult medical examination without abnormal findings: Secondary | ICD-10-CM

## 2021-09-08 DIAGNOSIS — N939 Abnormal uterine and vaginal bleeding, unspecified: Secondary | ICD-10-CM | POA: Diagnosis not present

## 2021-09-08 DIAGNOSIS — Z1231 Encounter for screening mammogram for malignant neoplasm of breast: Secondary | ICD-10-CM | POA: Diagnosis not present

## 2021-09-08 DIAGNOSIS — Z1159 Encounter for screening for other viral diseases: Secondary | ICD-10-CM | POA: Diagnosis not present

## 2021-09-08 DIAGNOSIS — E1165 Type 2 diabetes mellitus with hyperglycemia: Secondary | ICD-10-CM

## 2021-09-08 DIAGNOSIS — Z86018 Personal history of other benign neoplasm: Secondary | ICD-10-CM | POA: Diagnosis not present

## 2021-09-08 DIAGNOSIS — Z794 Long term (current) use of insulin: Secondary | ICD-10-CM | POA: Diagnosis not present

## 2021-09-08 DIAGNOSIS — E1142 Type 2 diabetes mellitus with diabetic polyneuropathy: Secondary | ICD-10-CM | POA: Diagnosis not present

## 2021-09-08 DIAGNOSIS — I152 Hypertension secondary to endocrine disorders: Secondary | ICD-10-CM | POA: Diagnosis not present

## 2021-09-08 DIAGNOSIS — E1159 Type 2 diabetes mellitus with other circulatory complications: Secondary | ICD-10-CM

## 2021-09-08 DIAGNOSIS — E669 Obesity, unspecified: Secondary | ICD-10-CM | POA: Diagnosis not present

## 2021-09-08 MED ORDER — LISINOPRIL 5 MG PO TABS
5.0000 mg | ORAL_TABLET | Freq: Every day | ORAL | 2 refills | Status: DC
Start: 2021-09-08 — End: 2021-09-22

## 2021-09-08 MED ORDER — HYDROCHLOROTHIAZIDE 25 MG PO TABS
25.0000 mg | ORAL_TABLET | Freq: Every day | ORAL | 2 refills | Status: DC
  Filled 2021-09-08: qty 30, 30d supply, fill #0

## 2021-09-08 NOTE — Progress Notes (Signed)
   CC: Vaginal spotting  HPI:  [name] is a 60 y.o. female with PMH as below who presents to clinic for follow-up on her chronic medical problems and evaluation of recent vaginal spotting. Please see problem based charting for evaluation, assessment and plan.  Past Medical History:  Diagnosis Date   Anemia    Asthma 11/01/2010   BPPV (benign paroxysmal positional vertigo) 03/07/2013   Diabetes mellitus    since 1983   Elevated TSH 05/13/2013   GERD (gastroesophageal reflux disease)    Hypertension    Left elbow pain 05/05/2016   Left shoulder pain 05/05/2016   Osteomyelitis ([place])    Pneumonia    as a child   Wrist pain 04/11/2013    Review of Systems:  Constitutional: Negative for fever or fatigue Eyes: Positive for eye straining. Respiratory: Negative for shortness of breath Cardiac: Negative for chest pain MSK: Negative for back pain Abdomen: Negative for abdominal pain, constipation.  Positive for occasional abdominal cramping. GU: Positive for vaginal bleeding. Negative for dysuria Neuro: Negative for headache or weakness  Physical Exam: General: Pleasant, well-appearing elderly woman.  No acute distress. Eye: EOMI. PERRLA Cardiac: Tachycardic. Regular rhythm. No murmurs, rubs or gallops. No LE edema Respiratory: Lungs CTAB. No wheezing or crackles. Abdominal: Soft, symmetric and non tender. Normal BS. Skin: Warm, dry and intact without rashes or lesions Extremities: Atraumatic. Full ROM.  Palpable pulses. Right big toe wound and callus.  No discharge. Neuro: A&O x 3. Moves all extremities.  No focal deficits Psych: Appropriate mood and affect.  Vitals:   09/08/21 1017 09/08/21 1042  BP: (!) 150/61 (!) 169/86  Pulse: 97 (!) 101  Temp: 98 F (36.7 C)   TempSrc: Oral   SpO2: 100%   Weight: (!) 300 lb 8 oz (136.3 kg)   Height: 6\' 1"  (1.854 m)      Assessment & Plan:   See Encounters Tab for problem based charting.  Patient discussed with Dr.  Lockie Pares, MD, MPH

## 2021-09-08 NOTE — Patient Instructions (Addendum)
Thank you, [name] for allowing [name] to provide your care today. Today we discussed your preventative screening measures, blood pressure, foot wounds and your vaginal spotting.    I have ordered the following labs for you:   Lab Orders         BMP8+Anion Gap         Hepatitis C antibody         Lipid Profile      I will call if any are abnormal. All of your labs can be accessed through "My Chart".  I have place a referrals to OB/GYN, ophthalmology and podiatry  I have ordered the following tests: Screening mammogram  I have ordered the following medication/changed the following medications:  Refilled your medications.  My Chart Access: [email or web address]  Please follow-up in 4 weeks  Please make sure to arrive 15 minutes prior to your next appointment. If you arrive late, you may be asked to reschedule.    We look forward to seeing you next time. Please call our clinic at [phone] if you have any questions or concerns. The best time to call is Monday-Friday from 9am-4pm, but there is someone available 24/7. If after hours or the weekend, call the main hospital number and ask for the Internal Medicine Resident On-Call. If you need medication refills, please notify your pharmacy one week in advance and they will send [name] a request.   Thank you for letting us take part in your care. Wishing you the best!  Lacinda Axon, MD 09/08/2021, 11:28 AM IM Resident, PGY-2 [facility] 41:10

## 2021-09-09 ENCOUNTER — Other Ambulatory Visit (HOSPITAL_COMMUNITY): Payer: Self-pay

## 2021-09-10 ENCOUNTER — Encounter: Payer: Self-pay | Admitting: Student

## 2021-09-10 ENCOUNTER — Other Ambulatory Visit (HOSPITAL_COMMUNITY): Payer: Self-pay

## 2021-09-10 DIAGNOSIS — N939 Abnormal uterine and vaginal bleeding, unspecified: Secondary | ICD-10-CM | POA: Insufficient documentation

## 2021-09-10 NOTE — Assessment & Plan Note (Signed)
Patient's BP elevated in clinic today to systolic in the 282S to 601V. Patient reports that she has been out of her BP medications for 2 days. She denies any headaches, dizziness or blurry vision. Vitals:   09/08/21 1017 09/08/21 1042  BP: (!) 150/61 (!) 169/86   Plan: --Refill lisinopril 5 mg daily --Refilled HCTZ 25 mg daily  -- Unfortunately, patient went home without getting her labs done. --BMP at next office visit, kidney function stable on last BMP 2 months ago.

## 2021-09-10 NOTE — Assessment & Plan Note (Signed)
Patient reports she now has insurance and would like to get her screenings done. -- Ordered screening mammogram

## 2021-09-10 NOTE — Assessment & Plan Note (Addendum)
Poorly controlled with complications of osteomyelitis, and peripheral neuropathy. A1c remains 9.6% 2 months ago. Blood sugar remains elevated in the 200 at home.  Patient has not had appointment with [name].  She is also not on any GLP-1 or SGLT2 inhibitor. The patient's BMI of 39.65, she would likely benefit GLP-1.  Plan: -- Continue metformin 1000 mg twice daily -- Continue glargine 70 units daily -- Continue Humalog 18 units 3 times daily with meals -- Referral to diabetes educator -- Referral to ophthalmology for eye exam -- Consider starting patient on a GLP-1 at next office visit to help with weight loss. --4-weeks follow-up for A1c check

## 2021-09-10 NOTE — Assessment & Plan Note (Signed)
Patient with a history of fibroids here for evaluation of recent vaginal bleeding. Patient reports she was in menopause for about 4 years however the last few weeks she has started spotting with associated abdominal cramping similar to her periods in the past. She denies any current abdominal pain or vaginal discharge.  Patient interested in referral back to OB/GYN.   Plan: -- Referral to OB/GYN for further evaluation and management

## 2021-09-10 NOTE — Assessment & Plan Note (Signed)
Patient with a history of osteomyelitis of the left foot status post amputation of 1st and 2nd digits as well as recent right toe osteomyelitis.  She has completed antibiotics treatment with ID and follows closely with Dr. Heber [place] at the wound center.  Patient reports she also have ingrown toenails and would like to follow-up with podiatry now that she has insurance. On exam, right big toe with some callus.   Plan: -- Continue wound care as instructed by Dr. Heber [place] -- Podiatry referral -- Wound care follow-up on 10/20

## 2021-09-10 NOTE — Assessment & Plan Note (Signed)
One-time hepatitis C screening ordered however patient forgot to stay for lab draw.  -- Hepatitis Cantibody Future order

## 2021-09-10 NOTE — Progress Notes (Signed)
Internal Medicine Clinic Attending  Case discussed with Dr. [place]  At the time of the visit.  We reviewed the resident's history and exam and pertinent patient test results.  I agree with the assessment, diagnosis, and plan of care documented in the resident's note.  

## 2021-09-13 ENCOUNTER — Other Ambulatory Visit (HOSPITAL_COMMUNITY): Payer: Self-pay

## 2021-09-13 MED ORDER — ATORVASTATIN CALCIUM 40 MG PO TABS
40.0000 mg | ORAL_TABLET | Freq: Every day | ORAL | 0 refills | Status: DC
  Filled 2021-09-13: qty 90, 90d supply, fill #0

## 2021-09-13 NOTE — Telephone Encounter (Signed)
Patient will need lipid panel rechecked at next office visit (10/11/2021) with medication adjustment.  Sent in 90 day supply.

## 2021-09-16 ENCOUNTER — Other Ambulatory Visit: Payer: Self-pay

## 2021-09-16 ENCOUNTER — Encounter (HOSPITAL_BASED_OUTPATIENT_CLINIC_OR_DEPARTMENT_OTHER): Payer: Medicaid Other | Admitting: Internal Medicine

## 2021-09-16 DIAGNOSIS — L97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 exposed: Secondary | ICD-10-CM

## 2021-09-16 DIAGNOSIS — E11621 Type 2 diabetes mellitus with foot ulcer: Secondary | ICD-10-CM | POA: Diagnosis not present

## 2021-09-20 ENCOUNTER — Encounter: Payer: Medicaid Other | Admitting: Dietician

## 2021-09-21 ENCOUNTER — Ambulatory Visit: Payer: Medicaid Other | Admitting: Podiatry

## 2021-09-21 NOTE — Progress Notes (Signed)
[name], [name] ([id number]) Visit Report for 09/16/2021 Chief Complaint Document Details Patient Name: Date of Service: [name], [name] 09/16/2021 10:30 A M Medical Record Number: [id number] Patient Account Number: [id number] Date of Birth/Sex: Treating RN: 03-Oct-1961 (60 y.o. [name] Primary Care Provider: Christiana Rivera Other Clinician: Referring Provider: Treating Provider/Extender: [name] in Treatment: 20 Information Obtained from: Patient Chief Complaint Right great toe wound Electronic Signature(s) Signed: 09/16/2021 1:05:54 PM By: Debra Shan DO Entered By: [name] on 09/16/2021 12:58:43 -------------------------------------------------------------------------------- Debridement Details Patient Name: Date of Service: [name], [name] 09/16/2021 10:30 A M Medical Record Number: [id number] Patient Account Number: [id number] Date of Birth/Sex: Treating RN: 14-Jun-1961 (60 y.o. [name], [name] Primary Care Provider: Christiana Rivera Other Clinician: Referring Provider: Treating Provider/Extender: [name] in Treatment: 20 Debridement Performed for Assessment: Wound #13 Right,Posterior T Great oe Performed By: Physician Debra Shan, DO Debridement Type: Debridement Severity of Tissue Pre Debridement: Limited to breakdown of skin Level of Consciousness (Pre-procedure): Awake and Alert Pre-procedure Verification/Time Out Yes - 11:37 Taken: Start Time: 11:37 Pain Control: Lidocaine T Area Debrided (L x W): otal 0.1 (cm) x 0.1 (cm) = 0.01 (cm) Tissue and other material debrided: Viable, Non-Viable, Callus Level: Non-Viable Tissue Debridement Description: Selective/Open Wound Instrument: Curette Bleeding: Minimum Hemostasis Achieved: Pressure End Time: 11:37 Procedural Pain: 0 Post Procedural Pain: 0 Response to Treatment: Procedure was tolerated well Level of Consciousness  (Post- Awake and Alert procedure): Post Debridement Measurements of Total Wound Length: (cm) 0.1 Width: (cm) 0.1 Depth: (cm) 0.1 Volume: (cm) 0.001 Character of Wound/Ulcer Post Debridement: Improved Severity of Tissue Post Debridement: Limited to breakdown of skin Post Procedure Diagnosis Same as Pre-procedure Electronic Signature(s) Signed: 09/16/2021 1:05:54 PM By: Debra Shan DO Signed: 09/21/2021 5:23:14 PM By: Debra Hammock RN Entered By: [name] on 09/16/2021 11:42:05 -------------------------------------------------------------------------------- HPI Details Patient Name: Date of Service: [name], [name] 09/16/2021 10:30 A M Medical Record Number: [id number] Patient Account Number: [id number] Date of Birth/Sex: Treating RN: 14-Mar-1961 (60 y.o. [name] Primary Care Provider: Christiana Rivera Other Clinician: Referring Provider: Treating Provider/Extender: [name] in Treatment: 20 History of Present Illness Location: notice some discharge and callus on the right big toe Quality: Patient reports experiencing a dull pain to affected area(s). Severity: Patient states wound(s) are getting worse. Duration: Patient has had the wound for < 2 weeks prior to presenting for treatment Timing: Pain in wound is Intermittent (comes and goes Context: The wound occurred when the patient was cutting her toenails and noticed some discharge from this area and was very concerned about it. Modifying Factors: Patient wound(s)/ulcer(s) are improving due to:he has seen her PCP who put her on Bactrim and Keflex antibiotic ssociated Signs and Symptoms: Patient reports having decrease discharge she started on her antibiotic. A HPI Description: [name] is a 60 year old female with a past medical history of uncontrolled type 2 diabetes on insulin, osteomyelitis with amputations of 1st and 2nd toes of her left foot that presents to  clinic for wound care to the right great toe wound. She states she noticed the wound 3 weeks ago. Not quite sure how it started. She has been using silver alginate to the area. She was recently hospitalized from a clinic visit in the internal medicine clinic for this issue. While hospitalized she had an MRI of the right foot that showed likely early acute osteomyelitis with possible septic arthritis. She was discharged on  ciprofloxacin for 28 days. She currently does not deny pain increased warmth or erythema to the area. She has some drainage but not purulent. 6/9; patient presents for 1 week follow-up. She has no complaints today and denies signs of infection. She missed her infectious disease appointment yesterday but has it rescheduled for the 21st. She has been using silver alginate to the wound. 6/23; patient presents for 2-week follow-up. She followed up with Dr. Megan Rivera, infectious disease 2 days ago. She had some blood work done and was told to finish ciprofloxacin. She is having a follow-up virtual encounter next week. There has been an increase in callus development. She is unable to use her offloading shoe due to having to walk for long periods due to needing public transportation. She continues to use silver alginate to the wound. She denies signs of infection. 7/8; patient presents for follow-up. She missed her appointment last week. She reports increased callus to the surrounding wound area. She continues to use silver alginate. She denies signs of infection. 7/14; patient presents for 1 week follow-up. She has been using silver alginate to the wound bed. She has no issues or complaints today. She denies signs of infection. 7/28; patient presents for 2-week follow-up. She has been using silver alginate to the wound bed. She has been moving to a new location over the past week. She has no issues or complaints today. She denies signs of infection. 8/18; patient presents for follow-up.  She was recently hospitalized for COVID and has not been able to follow-up for wound care. She was last seen 3 weeks ago. She reports receiving wound care while hospitalized. Since discharge of the hospital she is reported more drainage from the wound site. She denies signs of infection. 8/25; patient presents for follow-up. She has no issues or complaints today. She has been using silver alginate daily to the wound bed. She denies signs of infection. 9/15; patient presents for follow-up. She missed her last clinic appointment. She reports using silver alginate to the wound bed. She denies signs of infection. Due to transportation she has to walk for long periods of time. It is hard for the patient to offload this area well. 9/22; patient presents for follow-up. She has been using silver alginate to the wound bed. She reports a new wound to the distal portion of her right great toe. She states she stopped this last week. She has started Keflex for a wound culture done at last clinic visit. Due to cost she is not able to use the gentamicin cream prescribed. She states she is going to try different pharmacy to see if she can obtain this 10/6; patient presents for follow-up. She has been using gentamicin cream and silver alginate to the wound beds. The most distal portion of the right great toe has healed. She has no issues or complaints today. She denies signs of infection. 10/20; patient presents for follow-up. She has been using gentamicin cream and silver alginate to the wound bed and reports that it has closed over in the past week with callus. She removed part of her right great toenail and is following up with podiatry for this issue. She currently denies signs of infection. Electronic Signature(s) Signed: 09/16/2021 1:05:54 PM By: Debra Shan DO Entered By: [name] on 09/16/2021 12:59:59 -------------------------------------------------------------------------------- Physical  Exam Details Patient Name: Date of Service: [name], [name] 09/16/2021 10:30 A M Medical Record Number: [id number] Patient Account Number: [id number] Date of Birth/Sex: Treating RN: [birth year] (60 y.o.  [name] Primary Care Provider: Christiana Rivera Other Clinician: Referring Provider: Treating Provider/Extender: [name] in Treatment: 20 Constitutional respirations regular, non-labored and within target range for patient.Marland Kitchen Psychiatric pleasant and cooperative. Notes Right great toe: T the plantar aspect there was callus to the previous wound site and post-debridement there appears to be a healing wound with no drainage o noted and granulation tissue present. Electronic Signature(s) Signed: 09/16/2021 1:05:54 PM By: Debra Shan DO Entered By: [name] on 09/16/2021 13:01:59 -------------------------------------------------------------------------------- Physician Orders Details Patient Name: Date of Service: [name], [name] 09/16/2021 10:30 A M Medical Record Number: [id number] Patient Account Number: [id number] Date of Birth/Sex: Treating RN: 08-May-1961 (60 y.o. [name], [name] Primary Care Provider: Christiana Rivera Other Clinician: Referring Provider: Treating Provider/Extender: [name] in Treatment: 20 Verbal / Phone Orders: No Diagnosis Coding ICD-10 Coding Code Description E11.621 Type 2 diabetes mellitus with foot ulcer M86.171 Other acute osteomyelitis, right ankle and foot L97.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 exposed E11.65 Type 2 diabetes mellitus with hyperglycemia Follow-up Appointments ppointment in 2 weeks. - Dr. Heber [place] Return A Bathing/ Shower/ Hygiene May shower with protection but do not get wound dressing(s) wet. - May use cast wrap for protection Edema Control - Lymphedema / SCD / Other Elevate legs to the level of the heart  or above for 30 minutes daily and/or when sitting, a frequency of: - 3-4 times throughout the day. Avoid standing for long periods of time. Off-Loading Wedge shoe to: - Patient to wear while standing and walking. Wound Treatment Wound #13 - T Great oe Wound Laterality: Right, Posterior Cleanser: Wound Cleanser (Generic) 1 x Per Day/15 Days Discharge Instructions: Cleanse the wound with wound cleanser prior to applying a clean dressing using gauze sponges, not tissue or cotton balls. Topical: Gentamicin 1 x Per Day/15 Days Discharge Instructions: thin layer to wound bed Prim Dressing: KerraCel Ag Gelling Fiber Dressing, 4x5 in (silver alginate) (Generic) 1 x Per Day/15 Days ary Discharge Instructions: Apply silver alginate to wound bed as instructed Secondary Dressing: Woven Gauze Sponge, Non-Sterile 4x4 in (Generic) 1 x Per Day/15 Days Discharge Instructions: Apply over primary dressing as directed. Secondary Dressing: Optifoam Non-Adhesive Dressing, 4x4 in (Generic) 1 x Per Day/15 Days Discharge Instructions: **Foam donut***Apply over primary dressing as directed. Secured With: Child psychotherapist, Sterile 2x75 (in/in) (Generic) 1 x Per Day/15 Days Discharge Instructions: Secure with stretch gauze as directed. Secured With: 64M Medipore H Soft Cloth Surgical Tape, 2x2 (in/yd) (Generic) 1 x Per Day/15 Days Discharge Instructions: Secure dressing with tape as directed. Electronic Signature(s) Signed: 09/16/2021 1:05:54 PM By: Debra Shan DO Entered By: [name] on 09/16/2021 13:02:18 -------------------------------------------------------------------------------- Problem List Details Patient Name: Date of Service: [name], [name] 09/16/2021 10:30 A M Medical Record Number: [id number] Patient Account Number: [id number] Date of Birth/Sex: Treating RN: 28-Aug-1961 (60 y.o. [name] Primary Care Provider: Christiana Rivera Other Clinician: Referring  Provider: Treating Provider/Extender: [name] in Treatment: 20 Active Problems ICD-10 Encounter Code Description Active Date MDM Diagnosis E11.621 Type 2 diabetes mellitus with foot ulcer 04/29/2021 No Yes M86.171 Other acute osteomyelitis, right ankle and foot 04/29/2021 No Yes L97.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 exposed 04/29/2021 No Yes E11.65 Type 2 diabetes mellitus with hyperglycemia 04/29/2021 No Yes Inactive Problems Resolved Problems Electronic Signature(s) Signed: 09/16/2021 1:05:54 PM By: Debra Shan DO Entered  By: [name] on 09/16/2021 12:58:28 -------------------------------------------------------------------------------- Progress Note/History and Physical Details Patient Name: Date of Service: [name], [name] 09/16/2021 10:30 A M Medical Record Number: [id number] Patient Account Number: [id number] Date of Birth/Sex: Treating RN: 11/17/1961 (60 y.o. [name] Primary Care Provider: Christiana Rivera Other Clinician: Referring Provider: Treating Provider/Extender: [name] in Treatment: 20 Subjective Chief Complaint Information obtained from Patient Right great toe wound History of Present Illness (HPI) The following HPI elements were documented for the patient's wound: Location: notice some discharge and callus on the right big toe Quality: Patient reports experiencing a dull pain to affected area(s). Severity: Patient states wound(s) are getting worse. Duration: Patient has had the wound for < 2 weeks prior to presenting for treatment Timing: Pain in wound is Intermittent (comes and goes Context: The wound occurred when the patient was cutting her toenails and noticed some discharge from this area and was very concerned about it. Modifying Factors: Patient wound(s)/ulcer(s) are improving due to:he has seen her PCP who put her on Bactrim and Keflex  antibiotic Associated Signs and Symptoms: Patient reports having decrease discharge she started on her antibiotic. Ms. Aquinnah Devin is a 60 year old female with a past medical history of uncontrolled type 2 diabetes on insulin, osteomyelitis with amputations of 1st and 2nd toes of her left foot that presents to clinic for wound care to the right great toe wound. She states she noticed the wound 3 weeks ago. Not quite sure how it started. She has been using silver alginate to the area. She was recently hospitalized from a clinic visit in the internal medicine clinic for this issue. While hospitalized she had an MRI of the right foot that showed likely early acute osteomyelitis with possible septic arthritis. She was discharged on ciprofloxacin for 28 days. She currently does not deny pain increased warmth or erythema to the area. She has some drainage but not purulent. 6/9; patient presents for 1 week follow-up. She has no complaints today and denies signs of infection. She missed her infectious disease appointment yesterday but has it rescheduled for the 21st. She has been using silver alginate to the wound. 6/23; patient presents for 2-week follow-up. She followed up with Dr. Megan Rivera, infectious disease 2 days ago. She had some blood work done and was told to finish ciprofloxacin. She is having a follow-up virtual encounter next week. There has been an increase in callus development. She is unable to use her offloading shoe due to having to walk for long periods due to needing public transportation. She continues to use silver alginate to the wound. She denies signs of infection. 7/8; patient presents for follow-up. She missed her appointment last week. She reports increased callus to the surrounding wound area. She continues to use silver alginate. She denies signs of infection. 7/14; patient presents for 1 week follow-up. She has been using silver alginate to the wound bed. She has no issues or  complaints today. She denies signs of infection. 7/28; patient presents for 2-week follow-up. She has been using silver alginate to the wound bed. She has been moving to a new location over the past week. She has no issues or complaints today. She denies signs of infection. 8/18; patient presents for follow-up. She was recently hospitalized for COVID and has not been able to follow-up for wound care. She was last seen 3 weeks ago. She reports receiving wound care while hospitalized. Since discharge of the hospital she is reported more drainage from  the wound site. She denies signs of infection. 8/25; patient presents for follow-up. She has no issues or complaints today. She has been using silver alginate daily to the wound bed. She denies signs of infection. 9/15; patient presents for follow-up. She missed her last clinic appointment. She reports using silver alginate to the wound bed. She denies signs of infection. Due to transportation she has to walk for long periods of time. It is hard for the patient to offload this area well. 9/22; patient presents for follow-up. She has been using silver alginate to the wound bed. She reports a new wound to the distal portion of her right great toe. She states she stopped this last week. She has started Keflex for a wound culture done at last clinic visit. Due to cost she is not able to use the gentamicin cream prescribed. She states she is going to try different pharmacy to see if she can obtain this 10/6; patient presents for follow-up. She has been using gentamicin cream and silver alginate to the wound beds. The most distal portion of the right great toe has healed. She has no issues or complaints today. She denies signs of infection. 10/20; patient presents for follow-up. She has been using gentamicin cream and silver alginate to the wound bed and reports that it has closed over in the past week with callus. She removed part of her right great toenail  and is following up with podiatry for this issue. She currently denies signs of infection. Patient History Information obtained from Patient. Family History Diabetes - Father,Child, Hypertension - Mother,Father, No family history of Cancer, Heart Disease, Hereditary Spherocytosis, Kidney Disease, Lung Disease, Seizures, Stroke, Thyroid Problems, Tuberculosis. Social History Never smoker, Marital Status - Divorced, Alcohol Use - Never, Drug Use - No History, Caffeine Use - Rarely - soda. Medical History Eyes Denies history of Cataracts, Glaucoma, Optic Neuritis Ear/Nose/Mouth/Throat Denies history of Chronic sinus problems/congestion, Middle ear problems Hematologic/Lymphatic Patient has history of Anemia Denies history of Hemophilia, Human Immunodeficiency Virus, Lymphedema, Sickle Cell Disease Respiratory Patient has history of Asthma Denies history of Aspiration, Chronic Obstructive Pulmonary Disease (COPD), Pneumothorax, Sleep Apnea, Tuberculosis Cardiovascular Patient has history of Hypertension Denies history of Angina, Arrhythmia, Congestive Heart Failure, Coronary Artery Disease, Deep Vein Thrombosis, Hypotension, Myocardial Infarction, Peripheral Arterial Disease, Peripheral Venous Disease, Phlebitis, Vasculitis Gastrointestinal Denies history of Cirrhosis , Colitis, Crohnoos, Hepatitis A, Hepatitis B, Hepatitis C Endocrine Patient has history of Type II Diabetes Denies history of Type I Diabetes Genitourinary Denies history of End Stage Renal Disease Immunological Denies history of Lupus Erythematosus, Raynaudoos, Scleroderma Integumentary (Skin) Denies history of History of Burn Musculoskeletal Patient has history of Osteomyelitis - hx left great toe Denies history of Gout, Rheumatoid Arthritis, Osteoarthritis Neurologic Patient has history of Neuropathy Denies history of Dementia, Quadriplegia, Paraplegia, Seizure Disorder Oncologic Denies history of Received  Chemotherapy, Received Radiation Psychiatric Patient has history of Confinement Anxiety Denies history of Anorexia/bulimia Patient is treated with Insulin, Oral Agents. Blood sugar is tested. Blood sugar results noted at the following times: Lunch - 120-160. Hospitalization/Surgery History - left second toe amputation. - left great toe amputation. - c-section x4. Medical A Surgical History Notes nd Gastrointestinal GERD Objective Constitutional respirations regular, non-labored and within target range for patient.. Vitals Time Taken: 11:08 AM, Height: 73 in, Weight: 301 lbs, BMI: 39.7, Temperature: 98.7 F, Pulse: 109 bpm, Respiratory Rate: 17 breaths/min, Blood Pressure: 137/74 mmHg, Capillary Blood Glucose: 219 mg/dl. Psychiatric pleasant and cooperative. General Notes: Right great  toe: T the plantar aspect there was callus to the previous wound site and post-debridement there appears to be a healing wound o with no drainage noted and granulation tissue present. Integumentary (Hair, Skin) Wound #13 status is Open. Original cause of wound was Gradually Appeared. The date acquired was: 04/05/2021. The wound has been in treatment 20 weeks. The wound is located on the [place]. The wound measures 0.1cm length x 0.1cm width x 0.1cm depth; 0.008cm^2 area and 0.001cm^3 oe volume. There is Fat Layer (Subcutaneous Tissue) exposed. There is no tunneling or undermining noted. There is a small amount of serosanguineous drainage noted. The wound margin is well defined and not attached to the wound base. There is large (67-100%) red granulation within the wound bed. There is no necrotic tissue within the wound bed. Assessment Active Problems ICD-10 Type 2 diabetes mellitus with foot ulcer Other acute osteomyelitis, right ankle and foot Non-pressure chronic ulcer of other part of right foot with fat layer exposed Type 2 diabetes mellitus with hyperglycemia Patient's wound has shown  improvement in size and appearance since last clinic visit. After removing the callus the wound appears to be healing well. I recommended continuing gentamicin cream With silver alginate and offloading the area. Follow-up in 2 weeks. Procedures Wound #13 Pre-procedure diagnosis of Wound #13 is a Diabetic Wound/Ulcer of the Lower Extremity located on the [place] .Severity of Tissue Pre oe Debridement is: Limited to breakdown of skin. There was a Selective/Open Wound Non-Viable Tissue Debridement with a total area of 0.01 sq cm performed by Debra Shan, DO. With the following instrument(s): Curette to remove Viable and Non-Viable tissue/material. Material removed includes Callus after achieving pain control using Lidocaine. No specimens were taken. A time out was conducted at 11:37, prior to the start of the procedure. A Minimum amount of bleeding was controlled with Pressure. The procedure was tolerated well with a pain level of 0 throughout and a pain level of 0 following the procedure. Post Debridement Measurements: 0.1cm length x 0.1cm width x 0.1cm depth; 0.001cm^3 volume. Character of Wound/Ulcer Post Debridement is improved. Severity of Tissue Post Debridement is: Limited to breakdown of skin. Post procedure Diagnosis Wound #13: Same as Pre-Procedure Plan Follow-up Appointments: Return Appointment in 2 weeks. - Dr. Heber [place] Bathing/ Shower/ Hygiene: May shower with protection but do not get wound dressing(s) wet. - May use cast wrap for protection Edema Control - Lymphedema / SCD / Other: Elevate legs to the level of the heart or above for 30 minutes daily and/or when sitting, a frequency of: - 3-4 times throughout the day. Avoid standing for long periods of time. Off-Loading: Wedge shoe to: - Patient to wear while standing and walking. WOUND #13: - T Great Wound Laterality: Right, Posterior oe Cleanser: Wound Cleanser (Generic) 1 x Per Day/15 Days Discharge  Instructions: Cleanse the wound with wound cleanser prior to applying a clean dressing using gauze sponges, not tissue or cotton balls. Topical: Gentamicin 1 x Per Day/15 Days Discharge Instructions: thin layer to wound bed Prim Dressing: KerraCel Ag Gelling Fiber Dressing, 4x5 in (silver alginate) (Generic) 1 x Per Day/15 Days ary Discharge Instructions: Apply silver alginate to wound bed as instructed Secondary Dressing: Woven Gauze Sponge, Non-Sterile 4x4 in (Generic) 1 x Per Day/15 Days Discharge Instructions: Apply over primary dressing as directed. Secondary Dressing: Optifoam Non-Adhesive Dressing, 4x4 in (Generic) 1 x Per Day/15 Days Discharge Instructions: **Foam donut***Apply over primary dressing as directed. Secured With: Child psychotherapist,  Sterile 2x75 (in/in) (Generic) 1 x Per Day/15 Days Discharge Instructions: Secure with stretch gauze as directed. Secured With: 53M Medipore H Soft Cloth Surgical T ape, 2x2 (in/yd) (Generic) 1 x Per Day/15 Days Discharge Instructions: Secure dressing with tape as directed. 1. In office sharp debridement 2. Gentamicin and silver alginate 3. Follow-up in 2 weeks Electronic Signature(s) Signed: 09/16/2021 1:05:54 PM By: Debra Shan DO Entered By: [name] on 09/16/2021 13:04:00 -------------------------------------------------------------------------------- HxROS Details Patient Name: Date of Service: [name], [name] 09/16/2021 10:30 A M Medical Record Number: [id number] Patient Account Number: [id number] Date of Birth/Sex: Treating RN: Jan 20, 1961 (60 y.o. [name] Primary Care Provider: Christiana Rivera Other Clinician: Referring Provider: Treating Provider/Extender: [name] in Treatment: 20 Label Progress Note Print Version as History and Physical for this encounter Information Obtained From Patient Eyes Medical History: Negative for: Cataracts; Glaucoma; Optic  Neuritis Ear/Nose/Mouth/Throat Medical History: Negative for: Chronic sinus problems/congestion; Middle ear problems Hematologic/Lymphatic Medical History: Positive for: Anemia Negative for: Hemophilia; Human Immunodeficiency Virus; Lymphedema; Sickle Cell Disease Respiratory Medical History: Positive for: Asthma Negative for: Aspiration; Chronic Obstructive Pulmonary Disease (COPD); Pneumothorax; Sleep Apnea; Tuberculosis Cardiovascular Medical History: Positive for: Hypertension Negative for: Angina; Arrhythmia; Congestive Heart Failure; Coronary Artery Disease; Deep Vein Thrombosis; Hypotension; Myocardial Infarction; Peripheral Arterial Disease; Peripheral Venous Disease; Phlebitis; Vasculitis Gastrointestinal Medical History: Negative for: Cirrhosis ; Colitis; Crohns; Hepatitis A; Hepatitis B; Hepatitis C Past Medical History Notes: GERD Endocrine Medical History: Positive for: Type II Diabetes Negative for: Type I Diabetes Time with diabetes: 82 years Treated with: Insulin, Oral agents Blood sugar tested every day: Yes Tested : 2 times per day Blood sugar testing results: Lunch: 120-160 Genitourinary Medical History: Negative for: End Stage Renal Disease Immunological Medical History: Negative for: Lupus Erythematosus; Raynauds; Scleroderma Integumentary (Skin) Medical History: Negative for: History of Burn Musculoskeletal Medical History: Positive for: Osteomyelitis - hx left great toe Negative for: Gout; Rheumatoid Arthritis; Osteoarthritis Neurologic Medical History: Positive for: Neuropathy Negative for: Dementia; Quadriplegia; Paraplegia; Seizure Disorder Oncologic Medical History: Negative for: Received Chemotherapy; Received Radiation Psychiatric Medical History: Positive for: Confinement Anxiety Negative for: Anorexia/bulimia Immunizations Pneumococcal Vaccine: Received Pneumococcal Vaccination: Yes Received Pneumococcal Vaccination On or After  61th Birthday: No Immunization Notes: pt. 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 c-section x4 Family and Social History Cancer: No; Diabetes: Yes - Father,Child; Heart Disease: No; Hereditary Spherocytosis: No; Hypertension: Yes - Mother,Father; Kidney Disease: No; Lung Disease: No; Seizures: No; Stroke: No; Thyroid Problems: No; Tuberculosis: No; Never smoker; Marital Status - Divorced; Alcohol Use: Never; Drug Use: No History; Caffeine Use: Rarely - soda; Financial Concerns: No; Food, Clothing or Shelter Needs: No; Support System Lacking: No; Transportation Concerns: No Electronic Signature(s) Signed: 09/16/2021 1:05:54 PM By: Debra Shan DO Signed: 09/21/2021 5:23:14 PM By: Debra Hammock RN Entered By: [name] on 09/16/2021 13:00:07 -------------------------------------------------------------------------------- SuperBill Details Patient Name: Date of Service: [name], [name] 09/16/2021 Medical Record Number: [id number] Patient Account Number: [id number] Date of Birth/Sex: Treating RN: 06/15/1961 (60 y.o. [name], [name] Primary Care Provider: Christiana Rivera Other Clinician: Referring Provider: Treating Provider/Extender: [name] in Treatment: 20 Diagnosis Coding ICD-10 Codes Code Description [phone] Type 2 diabetes mellitus with foot ulcer M86.171 Other acute osteomyelitis, right ankle and foot L97.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 exposed E11.65 Type 2 diabetes mellitus with hyperglycemia Facility Procedures CPT4 Code:  16109604 I Description: [phone] - DEBRIDE WOUND 1ST 20 SQ CM OR < CD-10 Diagnosis Description L97.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 exposed Modifier: Quantity: 1 Physician Procedures : CPT4 Code Description  Modifier [id number] - WC PHYS DEBR WO ANESTH 20 SQ CM ICD-10 Diagnosis Description L97.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 exposed Quantity: 1 Electronic Signature(s) Signed: 09/16/2021 1:05:54 PM By: Debra Shan DO Entered By: [name] on 09/16/2021 13:04:26

## 2021-09-21 NOTE — Progress Notes (Signed)
Debra, Rivera ([id number]) Visit Report for 09/16/2021 Arrival Information Details Patient Name: Date of Service: [name], [name] 09/16/2021 10:30 A M Medical Record Number: [id number] Patient Account Number: [id number] Date of Birth/Sex: Treating RN: [birth year] (60 y.o. [name], [name] Primary Care Miyoshi Ligas: Christiana Fuchs Other Clinician: Referring Quintez Maselli: Treating Elanora Quin/Extender: Doran Stabler in Treatment: 20 Visit Information History Since Last Visit Added or deleted any medications: No Patient Arrived: Ambulatory Any new allergies or adverse reactions: No Arrival Time: 11:07 Had a fall or experienced change in No Accompanied By: self activities of daily living that may affect Transfer Assistance: None risk of falls: Patient Identification Verified: Yes Signs or symptoms of abuse/neglect since last visito No Secondary Verification Process Completed: Yes Hospitalized since last visit: No Patient Requires Transmission-Based Precautions: No Implantable device outside of the clinic excluding No Patient Has Alerts: Yes cellular tissue based products placed in the center Patient Alerts: ABI R=1.18 since last visit: ABI L=1.1 Has Dressing in Place as Prescribed: Yes Pain Present Now: No Electronic Signature(s) Signed: 09/21/2021 5:23:14 PM By: Rhae Hammock RN Entered By: Rhae Hammock on 09/16/2021 11:08:13 -------------------------------------------------------------------------------- Encounter Discharge Information Details Patient Name: Date of Service: [name], [name] 09/16/2021 10:30 A M Medical Record Number: [id number] Patient Account Number: [id number] Date of Birth/Sex: Treating RN: 22-Sep-1961 (60 y.o. [name] Primary Care [place] Vanaken: Christiana Fuchs Other Clinician: Referring Devarious Pavek: Treating Walfred Bettendorf/Extender: Doran Stabler in Treatment: 20 Encounter Discharge Information Items  Post Procedure Vitals Discharge Condition: Stable Temperature (F): 98.7 Ambulatory Status: Ambulatory Pulse (bpm): 74 Discharge Destination: Home Respiratory Rate (breaths/min): 17 Transportation: Private Auto Blood Pressure (mmHg): 120/80 Accompanied By: self Schedule Follow-up Appointment: Yes Clinical Summary of Care: Patient Declined Electronic Signature(s) Signed: 09/21/2021 5:23:14 PM By: Rhae Hammock RN Entered By: Rhae Hammock on 09/16/2021 11:57:07 -------------------------------------------------------------------------------- Lower Extremity Assessment Details Patient Name: Date of Service: [name], [name] 09/16/2021 10:30 A M Medical Record Number: [id number] Patient Account Number: [id number] Date of Birth/Sex: Treating RN: [birth year] (60 y.o. [name] Primary Care [name]: Christiana Fuchs Other Clinician: Referring Tetsuo Coppola: Treating Jamerson Vonbargen/Extender: Doran Stabler in Treatment: 20 Edema Assessment Assessed: [Left: No] Patrice Paradise: Yes] Edema: [Left: N] [Right: o] Calf Left: Right: Point of Measurement: 27 cm From Medial Instep 42 cm Ankle Left: Right: Point of Measurement: 9 cm From Medial Instep 24 cm Vascular Assessment Pulses: Dorsalis Pedis Palpable: [Right:Yes] Posterior Tibial Palpable: [Right:Yes] Electronic Signature(s) Signed: 09/21/2021 5:23:14 PM By: Rhae Hammock RN Entered By: Rhae Hammock on 09/16/2021 11:09:00 -------------------------------------------------------------------------------- Multi Wound Chart Details Patient Name: Date of Service: [name], [facility] 09/16/2021 10:30 A M Medical Record Number: [id number] Patient Account Number: [id number] Date of Birth/Sex: Treating RN: 1961-07-26 (60 y.o. [name], [name] Primary Care Klaus Casteneda: Christiana Fuchs Other Clinician: Referring Shneur [place]: Treating Gaylin Osoria/Extender: Doran Stabler in Treatment:  20 Vital Signs Height(in): 73 Capillary Blood Glucose(mg/dl): 219 Weight(lbs): 301 Pulse(bpm): 109 Body Mass Index(BMI): 40 Blood Pressure(mmHg): 137/74 Temperature(F): 98.7 Respiratory Rate(breaths/min): 17 Photos: [13:Right, Posterior T Great oe] [N/A:N/A N/A] Wound Location: [13:Gradually Appeared] [N/A:N/A] Wounding Event: [13:Diabetic Wound/Ulcer of the Lower] [N/A:N/A] Primary Etiology: [13:Extremity Anemia, Asthma, Hypertension, Type N/A] Comorbid History: [13:II Diabetes, Osteomyelitis, Neuropathy, Confinement Anxiety 04/05/2021] [N/A:N/A] Date Acquired: [13:20] [N/A:N/A] Weeks of Treatment: [13:Open] [N/A:N/A] Wound Status: [13:0.1x0.1x0.1] [N/A:N/A] Measurements L x W x D (cm) [13:0.008] [N/A:N/A] A (cm) : rea [13:0.001] [N/A:N/A] Volume (cm) : [13:83.00%] [N/A:N/A] % Reduction in A [13:rea: 92.90%] [N/A:N/A] % Reduction in  Volume: [13:Grade 2] [N/A:N/A] Classification: [13:Small] [N/A:N/A] Exudate A mount: [13:Serosanguineous] [N/A:N/A] Exudate Type: [13:red, brown] [N/A:N/A] Exudate Color: [13:Well defined, not attached] [N/A:N/A] Wound Margin: [13:Large (67-100%)] [N/A:N/A] Granulation A mount: [13:Red] [N/A:N/A] Granulation Quality: [13:None Present (0%)] [N/A:N/A] Necrotic A mount: [13:Fat Layer (Subcutaneous Tissue): Yes N/A] Exposed Structures: [13:Fascia: No Tendon: No Muscle: No Joint: No Bone: No Medium (34-66%)] [N/A:N/A] Epithelialization: [13:Debridement - Selective/Open Wound N/A] Debridement: Pre-procedure Verification/Time Out 11:37 [N/A:N/A] Taken: [13:Lidocaine] [N/A:N/A] Pain Control: [13:Callus] [N/A:N/A] Tissue Debrided: [13:Non-Viable Tissue] [N/A:N/A] Level: [13:0.01] [N/A:N/A] Debridement A (sq cm): [13:rea Curette] [N/A:N/A] Instrument: [13:Minimum] [N/A:N/A] Bleeding: [13:Pressure] [N/A:N/A] Hemostasis A chieved: [13:0] [N/A:N/A] Procedural Pain: [13:0] [N/A:N/A] Post Procedural Pain: [13:Procedure was tolerated well]  [N/A:N/A] Debridement Treatment Response: [13:0.1x0.1x0.1] [N/A:N/A] Post Debridement Measurements L x W x D (cm) [13:0.001] [N/A:N/A] Post Debridement Volume: (cm) [13:Debridement] [N/A:N/A] Treatment Notes Wound #13 (Toe Great) Wound Laterality: Right, Posterior Cleanser Wound Cleanser Discharge Instruction: Cleanse the wound with wound cleanser prior to applying a clean dressing using gauze sponges, not tissue or cotton balls. Peri-Wound Care Topical Gentamicin Discharge Instruction: thin layer to wound bed Primary Dressing KerraCel Ag Gelling Fiber Dressing, 4x5 in (silver alginate) Discharge Instruction: Apply silver alginate to wound bed as instructed Secondary Dressing Woven Gauze Sponge, Non-Sterile 4x4 in Discharge Instruction: Apply over primary dressing as directed. Optifoam Non-Adhesive Dressing, 4x4 in Discharge Instruction: **Foam donut***Apply over primary dressing as directed. Secured With Conforming Stretch Gauze Bandage, Sterile 2x75 (in/in) Discharge Instruction: Secure with stretch gauze as directed. 29M Medipore H Soft Cloth Surgical T ape, 2x2 (in/yd) Discharge Instruction: Secure dressing with tape as directed. Compression Wrap Compression Stockings Add-Ons Electronic Signature(s) Signed: 09/16/2021 1:05:54 PM By: Kalman Shan DO Signed: 09/21/2021 5:23:14 PM By: Rhae Hammock RN Entered By: Kalman Shan on 09/16/2021 12:58:35 -------------------------------------------------------------------------------- Multi-Disciplinary Care Plan Details Patient Name: Date of Service: [name], [name] 09/16/2021 10:30 A M Medical Record Number: [id number] Patient Account Number: [id number] Date of Birth/Sex: Treating RN: [birth year] (60 y.o. [name] Primary Care Ryzen Deady: Christiana Fuchs Other Clinician: Referring Mirian Casco: Treating Zebbie Ace/Extender: Doran Stabler in Treatment: [place]  reviewed with physician Active Inactive Nutrition Nursing Diagnoses: Impaired glucose control: actual or potential Potential for alteratiion in Nutrition/Potential for imbalanced nutrition Goals: Patient/caregiver agrees to and verbalizes understanding of need to obtain nutritional consultation Date Initiated: 05/06/2021 Date Inactivated: 06/04/2021 Target Resolution Date: 05/27/2021 Goal Status: Met Patient/caregiver will maintain therapeutic glucose control Date Initiated: 05/06/2021 Target Resolution Date: 10/27/2021 Goal Status: Active Interventions: Assess HgA1c results as ordered upon admission and as needed Provide education on elevated blood sugars and impact on wound healing Provide education on nutrition Treatment Activities: Education provided on Nutrition : 09/02/2021 Patient referred to Primary Care Physician for further nutritional evaluation : 05/06/2021 Notes: Wound/Skin Impairment Nursing Diagnoses: Knowledge deficit related to ulceration/compromised skin integrity Goals: Patient/caregiver will verbalize understanding of skin care regimen Date Initiated: 05/06/2021 Target Resolution Date: 10/27/2021 Goal Status: Active Interventions: Assess patient/caregiver ability to obtain necessary supplies Assess patient/caregiver ability to perform ulcer/skin care regimen upon admission and as needed Provide education on ulcer and skin care Treatment Activities: Skin care regimen initiated : 05/06/2021 Topical wound management initiated : 05/06/2021 Notes: Electronic Signature(s) Signed: 09/21/2021 5:23:14 PM By: Rhae Hammock RN Entered By: Rhae Hammock on 09/16/2021 11:55:25 -------------------------------------------------------------------------------- Pain Assessment Details Patient Name: Date of Service: [name], [name] 09/16/2021 10:30 A M Medical Record Number: [id number] Patient Account Number: [id number] Date of Birth/Sex: Treating RN: 1961/02/13 (60 y.o.  [name] Primary Care Samik Balkcom: Christiana Fuchs Other Clinician: Referring Patra Gherardi: Treating Shayona Hibbitts/Extender: Doran Stabler in Treatment: 20 Active Problems Location of Pain Severity and Description of Pain Patient Has Paino No Site Locations Pain Management and Medication Current Pain Management: Electronic Signature(s) Signed: 09/21/2021 5:23:14 PM By: Rhae Hammock RN Entered By: Rhae Hammock on 09/16/2021 11:08:51 -------------------------------------------------------------------------------- Patient/Caregiver Education Details Patient Name: Date of Service: [name], Rolena 10/20/2022andnbsp10:30 A M Medical Record Number: [id number] Patient Account Number: [id number] Date of Birth/Gender: Treating RN: 1961-01-31 (60 y.o. [name] Primary Care Physician: Christiana Fuchs Other Clinician: Referring Physician: Treating Physician/Extender: Doran Stabler in Treatment: 20 Education Assessment Education Provided To: Patient Education Topics Provided Elevated Blood Sugar/ Impact on Healing: Methods: Explain/Verbal Responses: Reinforcements needed, State content correctly Nutrition: Methods: Explain/Verbal Responses: Reinforcements needed, State content correctly Wound/Skin Impairment: Methods: Explain/Verbal Responses: Reinforcements needed, State content correctly Electronic Signature(s) Signed: 09/21/2021 5:23:14 PM By: Rhae Hammock RN Entered By: Rhae Hammock on 09/16/2021 11:55:53 -------------------------------------------------------------------------------- Wound Assessment Details Patient Name: Date of Service: [name], [name] 09/16/2021 10:30 A M Medical Record Number: [id number] Patient Account Number: [id number] Date of Birth/Sex: Treating RN: 07-Sep-1961 (60 y.o. [name], [name] Primary Care Onofrio Klemp: Christiana Fuchs Other Clinician: Referring  Dodger Sinning: Treating Ambur Province/Extender: Doran Stabler in Treatment: 20 Wound Status Wound Number: 13 Primary Diabetic Wound/Ulcer of the Lower Extremity Etiology: Wound Location: Right, Posterior T Great oe Wound Open Wounding Event: Gradually Appeared Status: Date Acquired: 04/05/2021 Comorbid Anemia, Asthma, Hypertension, Type II Diabetes, Osteomyelitis, Weeks Of Treatment: 20 History: Neuropathy, Confinement Anxiety Clustered Wound: No Photos Wound Measurements Length: (cm) 0.1 Width: (cm) 0.1 Depth: (cm) 0.1 Area: (cm) 0.008 Volume: (cm) 0.001 % Reduction in Area: 83% % Reduction in Volume: 92.9% Epithelialization: Medium (34-66%) Tunneling: No Undermining: No Wound Description Classification: Grade 2 Wound Margin: Well defined, not attached Exudate Amount: Small Exudate Type: Serosanguineous Exudate Color: red, brown Foul Odor After Cleansing: No Slough/Fibrino No Wound Bed Granulation Amount: Large (67-100%) Exposed Structure Granulation Quality: Red Fascia Exposed: No Necrotic Amount: None Present (0%) Fat Layer (Subcutaneous Tissue) Exposed: Yes Tendon Exposed: No Muscle Exposed: No Joint Exposed: No Bone Exposed: No Treatment Notes Wound #13 (Toe Great) Wound Laterality: Right, Posterior Cleanser Wound Cleanser Discharge Instruction: Cleanse the wound with wound cleanser prior to applying a clean dressing using gauze sponges, not tissue or cotton balls. Peri-Wound Care Topical Gentamicin Discharge Instruction: thin layer to wound bed Primary Dressing KerraCel Ag Gelling Fiber Dressing, 4x5 in (silver alginate) Discharge Instruction: Apply silver alginate to wound bed as instructed Secondary Dressing Woven Gauze Sponge, Non-Sterile 4x4 in Discharge Instruction: Apply over primary dressing as directed. Optifoam Non-Adhesive Dressing, 4x4 in Discharge Instruction: **Foam donut***Apply over primary dressing as  directed. Secured With Conforming Stretch Gauze Bandage, Sterile 2x75 (in/in) Discharge Instruction: Secure with stretch gauze as directed. 40M Medipore H Soft Cloth Surgical T ape, 2x2 (in/yd) Discharge Instruction: Secure dressing with tape as directed. Compression Wrap Compression Stockings Add-Ons Electronic Signature(s) Signed: 09/21/2021 5:23:14 PM By: Rhae Hammock RN Entered By: Rhae Hammock on 09/16/2021 11:34:01 -------------------------------------------------------------------------------- Vitals Details Patient Name: Date of Service: [name], [facility] 09/16/2021 10:30 A M Medical Record Number: [id number] Patient Account Number: [id number] Date of Birth/Sex: Treating RN: Aug 18, 1961 (60 y.o. [name] Primary Care Preslyn Warr: Christiana Fuchs Other Clinician: Referring Jahdai Padovano: Treating Graziella Connery/Extender: Doran Stabler in Treatment: 20 Vital Signs Time Taken: 11:08 Temperature (F): 98.7 Height (in): 73  Pulse (bpm): 109 Weight (lbs): 301 Respiratory Rate (breaths/min): 17 Body Mass Index (BMI): 39.7 Blood Pressure (mmHg): 137/74 Capillary Blood Glucose (mg/dl): 219 Reference Range: 80 - 120 mg / dl Electronic Signature(s) Signed: 09/21/2021 5:23:14 PM By: Rhae Hammock RN Entered By: Rhae Hammock on 09/16/2021 11:08:46

## 2021-09-22 ENCOUNTER — Ambulatory Visit (INDEPENDENT_AMBULATORY_CARE_PROVIDER_SITE_OTHER): Payer: Medicaid Other | Admitting: Dietician

## 2021-09-22 ENCOUNTER — Encounter: Payer: Self-pay | Admitting: Dietician

## 2021-09-22 ENCOUNTER — Other Ambulatory Visit (HOSPITAL_COMMUNITY): Payer: Self-pay

## 2021-09-22 ENCOUNTER — Ambulatory Visit: Payer: Medicaid Other | Admitting: Student

## 2021-09-22 ENCOUNTER — Other Ambulatory Visit: Payer: Self-pay | Admitting: Dietician

## 2021-09-22 VITALS — BP 156/68 | HR 99 | Temp 98.6°F | Ht 73.0 in | Wt 298.8 lb

## 2021-09-22 DIAGNOSIS — E1165 Type 2 diabetes mellitus with hyperglycemia: Secondary | ICD-10-CM | POA: Diagnosis not present

## 2021-09-22 DIAGNOSIS — R42 Dizziness and giddiness: Secondary | ICD-10-CM | POA: Insufficient documentation

## 2021-09-22 DIAGNOSIS — E785 Hyperlipidemia, unspecified: Secondary | ICD-10-CM | POA: Diagnosis not present

## 2021-09-22 DIAGNOSIS — I152 Hypertension secondary to endocrine disorders: Secondary | ICD-10-CM | POA: Diagnosis not present

## 2021-09-22 DIAGNOSIS — Z1159 Encounter for screening for other viral diseases: Secondary | ICD-10-CM

## 2021-09-22 DIAGNOSIS — E1159 Type 2 diabetes mellitus with other circulatory complications: Secondary | ICD-10-CM

## 2021-09-22 DIAGNOSIS — E1169 Type 2 diabetes mellitus with other specified complication: Secondary | ICD-10-CM | POA: Diagnosis not present

## 2021-09-22 HISTORY — DX: Dizziness and giddiness: R42

## 2021-09-22 MED ORDER — MECLIZINE HCL 25 MG PO TABS
25.0000 mg | ORAL_TABLET | Freq: Three times a day (TID) | ORAL | 1 refills | Status: DC | PRN
  Filled 2021-09-22: qty 60, 20d supply, fill #0

## 2021-09-22 MED ORDER — LISINOPRIL-HYDROCHLOROTHIAZIDE 20-12.5 MG PO TABS
1.0000 | ORAL_TABLET | Freq: Every day | ORAL | 0 refills | Status: DC
  Filled 2021-09-22: qty 90, 90d supply, fill #0

## 2021-09-22 MED ORDER — OZEMPIC (0.25 OR 0.5 MG/DOSE) 2 MG/1.5ML ~~LOC~~ SOPN
0.2500 mg | PEN_INJECTOR | SUBCUTANEOUS | 2 refills | Status: DC
  Filled 2021-09-22: qty 1.5, 42d supply, fill #0

## 2021-09-22 MED ORDER — ONETOUCH DELICA PLUS LANCET33G MISC: 3 refills | Status: DC

## 2021-09-22 MED ORDER — ONETOUCH VERIO VI STRP: ORAL_STRIP | 3 refills | Status: DC

## 2021-09-22 MED ORDER — DEXCOM G6 RECEIVER DEVI: 0 refills | Status: DC

## 2021-09-22 MED ORDER — DEXCOM G6 SENSOR MISC: 3 refills | Status: DC

## 2021-09-22 MED ORDER — DEXCOM G6 TRANSMITTER MISC: 3 refills | Status: DC

## 2021-09-22 NOTE — Assessment & Plan Note (Addendum)
Remains uncontrolled. Patient meeting with [name] today for diabetes education and discussions about changing her meter.  Patient's diabetes remains uncontrolled while on multiple antidiabetic regimen.  We will start patient on Ozempic to improve improve A1c and possibly obesity.  Plan -- Continue metformin 1000 mg twice daily -- Continue glargine 70 units daily -- Continue Humalog 18 units 3 times daily with meals -- Start Ozempic 0.25 mg, inject once daily for 4 weeks, then 0.55 mg for another 4 weeks. -- Follow-up in 4 weeks

## 2021-09-22 NOTE — Progress Notes (Signed)
   CC: Vertigo, refills  HPI:  [name] is a 60 y.o. female with PMH as below presents for evaluation of vertigo and refills of medications. Please see problem based charting for evaluation, assessment and plan.  Past Medical History:  Diagnosis Date   Anemia    Asthma 11/01/2010   BPPV (benign paroxysmal positional vertigo) 03/07/2013   Diabetes mellitus    since 1983   Elevated TSH 05/13/2013   GERD (gastroesophageal reflux disease)    Hypertension    Left elbow pain 05/05/2016   Left shoulder pain 05/05/2016   Osteomyelitis ([place])    Pneumonia    as a child   Wrist pain 04/11/2013    Review of Systems:  Constitutional: Negative for fever. Positive for fatigue HEENT: Negative for ear ache, tinnitus, or sore throat. Positive for occasional cough Respiratory: Negative for shortness of breath Cardiac: Negative for chest pain Abdomen: Negative for abdominal pain, constipation or diarrhea Neuro: Negative for headache or weakness. Positive for dizziness and loss of balance  Physical Exam: General: Pleasant, middle-aged woman.  No acute distress. HEENT: Bilateral ear canals without erythema, edema or fluid.  Tympanic membrane intact.  MMM.  PERRLA.  EOMI Cardiac: RRR. No murmurs, rubs or gallops. No LE edema Respiratory: Lungs CTAB. No wheezing or crackles. Abdominal: Soft, symmetric and non tender. Normal BS. Skin: Warm, dry and intact without rashes or lesions Extremities: Atraumatic. Full ROM. Palpable radial and DP pulses. Neuro: A&O x 3. Moves all extremities. Normal Romberg test. No nystagmus. Normal finger-to-nose testing. Normal gait.  Psych: Appropriate mood and affect.  Vitals:   09/22/21 0852  BP: (!) 156/68  Pulse: 99  Temp: 98.6 F (37 C)  TempSrc: Oral  SpO2: 100%  Weight: 298 lb 12.8 oz (135.5 kg)  Height: 6\' 1"  (1.854 m)     Assessment & Plan:   See Encounters Tab for problem based charting.  Patient discussed with Dr. Lorenz Coaster, MD, MPH

## 2021-09-22 NOTE — Patient Instructions (Addendum)
Thank you for your visit today!  We talked about:   Continuous glucose monitoring- can request prescription and you can pick it up and call for an appointment to bring it back to learn how to use it.   We will also send in prescription for testing supplies 3 times a day  Please feel free to call me anytime.  [name] [phone]

## 2021-09-22 NOTE — Assessment & Plan Note (Signed)
Patient with a history of benign paroxysmal positional vertigo here for evaluation of worsening vertigo since her COVID infection about 2 months ago. Patient reports she had an episode of vertigo about 5 to 6 years ago that resolved with treatment.  Two months ago, she fell after feeling off balance and she was found to have a COVID infection after presenting to [facility] ED. Since her COVID infection, she has felt more fatigue and recently she has become more off balance. She has had about 2-3 episodes a week where she feels like the room is spinning and she is losing her equilibrium. She denies any recent falls, headaches, tinnitus, nausea or vomiting.  A/P: 60 year old with a history of BPPV here for evaluation of vertigo after recent COVID infection. HEENT and neuro exams unremarkable.  Patient's symptoms likely secondary to a postviral inflammation leading to peripheral vestibular neuritis.  Plan to trial patient on antihistamine and monitor for improvement in symptoms. -- Start meclizine 25 mg 3 times daily as needed for dizziness. -- 4-week follow-up for re-evaluation

## 2021-09-22 NOTE — Patient Instructions (Signed)
Thank you, Ms.Ula Lingo for allowing [name] to provide your care today. Today we discussed your diabetes, blood pressure and vertigo.  Have make few changes to your medications as stated below.  I have ordered the following labs for you:   Lab Orders         CMP14 + Anion Gap         Hepatitis C antibody         Lipid Profile      I will call if any are abnormal. All of your labs can be accessed through "My Chart".  I have ordered the following medication/changed the following medications:  Start meclizine 25 mg 3 times daily as needed for dizziness Start lisinopril-HCTZ 20-12.5 mg daily Discontinue lisinopril, HCTZ Start Ozempic 0.25 mg subcu once weekly for 4 weeks, then 0.5 mg subcu once weekly for another 4 weeks  My Chart Access: [email or web address]  Please follow-up in 4 weeks for follow-up  Please make sure to arrive 15 minutes prior to your next appointment. If you arrive late, you may be asked to reschedule.    We look forward to seeing you next time. Please call our clinic at [phone] if you have any questions or concerns. The best time to call is Monday-Friday from 9am-4pm, but there is someone available 24/7. If after hours or the weekend, call the main hospital number and ask for the Internal Medicine Resident On-Call. If you need medication refills, please notify your pharmacy one week in advance and they will send [name] a request.   Thank you for letting us take part in your care. Wishing you the best!  Lacinda Axon, MD 09/22/2021, 9:43 AM IM Resident, PGY-2 Oswaldo Milian 41:10

## 2021-09-22 NOTE — Progress Notes (Signed)
Diabetes Self-Management Education  Visit Type: First/Initial (plan to complete assessment at future visit, concentrated today's visit on patient needs)  Appt. Start Time: 1015 Appt. End Time: 1115  09/22/2021  Ms. Ula Lingo, identified by name and date of birth, is a 60 y.o. female with a diagnosis of Diabetes: Type 2.   ASSESSMENT Wants to decrease her weight.she states she has been gaining weight since she had covid and over the past 5 years and her current weight is the highest it has ever been. Her knees bother her as well and she states decreasing her weight will help her knees.   She is restarting semaglutide this week. She takes her Humalog before meals and skips it when she skips a meal. Her total daily dose is ~ 120 units/day ~ 0.9 units/kg/day.   Her last blood pressure, LDL, total cholesterol, triglycerides and A1C were also elevated.   Estimated body mass index is 39.42 kg/m as calculated from the following:   Height as of an earlier encounter on 09/22/21: 6\' 1"  (1.854 m).   Weight as of an earlier encounter on 09/22/21: 298 lb 12.8 oz (135.5 kg). Wt Readings from Last 10 Encounters:  09/22/21 298 lb 12.8 oz (135.5 kg)  09/08/21 (!) 300 lb 8 oz (136.3 kg)  06/30/21 285 lb 0.9 oz (129.3 kg)  05/18/21 295 lb (133.8 kg)  04/29/21 (!) 301 lb (136.5 kg)  04/14/21 298 lb 9.6 oz (135.4 kg)  07/10/20 288 lb 6.4 oz (130.8 kg)  06/19/20 (!) 295 lb 14.4 oz (134.2 kg)  06/10/20 290 lb 4.8 oz (131.7 kg)  05/26/20 294 lb 6.4 oz (133.5 kg)     Asked about insulin resistance.  Lab Results  Component Value Date   HGBA1C 9.6 (H) 06/30/2021   HGBA1C 9.6 (A) 04/14/2021   HGBA1C 9.4 (A) 05/06/2020   HGBA1C 10.1 (H) 02/24/2020   HGBA1C 9.9 (A) 08/19/2019    BP Readings from Last 3 Encounters:  09/22/21 (!) 156/68  09/08/21 (!) 169/86  07/01/21 115/68   Lipid Panel     Component Value Date/Time   CHOL 225 (H) 02/25/2020 0334   TRIG 188 (H) 02/25/2020 0334   HDL 64  02/25/2020 0334   CHOLHDL 3.5 02/25/2020 0334   VLDL 38 02/25/2020 0334   LDLCALC 123 (H) 02/25/2020 0334    Requested low carb/very low carb meal planning information. Requested meter and supplies and information about Continuous glucose monitoring covered by her insurance. Last menstrual period 06/23/2014.    Diabetes Self-Management Education - 09/22/21 1300       Visit Information   Visit Type First/Initial   plan to complete assessment at future visit, concentrated today's visit on patient needs     Initial Visit   Diabetes Type Type 2    Are you currently following a meal plan? No    Are you taking your medications as prescribed? Yes      Psychosocial Assessment   Patient Belief/Attitude about Diabetes Motivated to manage diabetes    Self-care barriers Lack of material resources    Self-management support Doctor's office;Family;CDE visits    Patient Concerns Nutrition/Meal planning;Monitoring;Weight Control    Special Needs None    Preferred Learning Style No preference indicated    Learning Readiness Contemplating    How often do you need to have someone help you when you read instructions, pamphlets, or other written materials from your doctor or pharmacy? 2 - Rarely      Pre-Education Assessment  Patient understands the diabetes disease and treatment process. Demonstrates understanding / competency    Patient understands incorporating nutritional management into lifestyle. Needs Instruction   on low(er) carb and anti-insulin resistance meal plan per her request   Patient undertands incorporating physical activity into lifestyle. Needs Review    Patient understands using medications safely. Needs Review    Patient understands monitoring blood glucose, interpreting and using results Needs Review   needs instruction on personal CGMs   Patient understands prevention, detection, and treatment of acute complications. Needs Review    Patient understands prevention,  detection, and treatment of chronic complications. Demonstrates understanding / competency    Patient understands how to develop strategies to address psychosocial issues. Demonstrates understanding / competency    Patient understands how to develop strategies to promote health/change behavior. Demonstrates understanding / competency      Complications   Last HgB A1C per patient/outside source 9.6 %    How often do you check your blood sugar? 3-4 times/day    Fasting Blood glucose range (mg/dL) --   unknown   Postprandial Blood glucose range (mg/dL) --   unknown   Number of hypoglycemic episodes per month 0    Number of hyperglycemic episodes per week --   unknown   Have you had a dilated eye exam in the past 12 months? Yes      Dietary Intake   Breakfast 10 am    Lunch 12-2 PM    Dinner 5-6 PM    Snack (evening) 930 PM 1/2 sandwich      Exercise   Exercise Type ADL's      Patient Education   Previous Diabetes Education Yes (please comment)    Nutrition management  Role of diet in the treatment of diabetes and the relationship between the three main macronutrients and blood glucose level;Carbohydrate counting;Meal options for control of blood glucose level and chronic complications.    Physical activity and exercise  Role of exercise on diabetes management, blood pressure control and cardiac health.    Medications Reviewed patients medication for diabetes, action, purpose, timing of dose and side effects.    Monitoring Other (comment)   educated about CGM options     Individualized Goals (developed by patient)   Monitoring  test my blood glucose as discussed   obtain CGM and schedule follow up     Outcomes   Expected Outcomes Demonstrated interest in learning. Expect positive outcomes    Future DMSE 4-6 wks    Program Status Not Completed             Individualized Plan for Diabetes Self-Management Training:   Learning Objective:  Patient will have a greater understanding  of diabetes self-management. Patient education plan is to attend individual and/or group sessions per assessed needs and concerns.   Plan:   Patient Instructions  Thank you for your visit today!  We talked about:   Continuous glucose monitoring- can request prescription and you can pick it up and call for an appointment to bring it back to learn how to use it.   We will also send in prescription for testing supplies 3 times a day  Please feel free to call me anytime.  [name] [phone]   Expected Outcomes:  Demonstrated interest in learning. Expect positive outcomes  Education material provided: Carbohydrate counting sheet and Diabetes Resources  If problems or questions, patient to contact team via:  Phone  Future DSME appointment: 4-6 wks Debera Lat, [place] 09/22/2021  1:42 PM.

## 2021-09-22 NOTE — Telephone Encounter (Signed)
Patient request CGM and OneTouch Verio testing supplies for 3x/day prescriptions be sent to [facility] on [address]

## 2021-09-23 ENCOUNTER — Encounter: Payer: Self-pay | Admitting: Student

## 2021-09-23 ENCOUNTER — Other Ambulatory Visit (HOSPITAL_COMMUNITY): Payer: Self-pay

## 2021-09-23 LAB — CMP14 + ANION GAP
ALT: 31 IU/L (ref 0–32)
AST: 26 IU/L (ref 0–40)
Albumin/Globulin Ratio: 1.5 (ref 1.2–2.2)
Albumin: 4.4 g/dL (ref 3.8–4.9)
Alkaline Phosphatase: 103 IU/L (ref 44–121)
Anion Gap: 16 mmol/L (ref 10.0–18.0)
BUN/Creatinine Ratio: 19 (ref 9–23)
BUN: 13 mg/dL (ref 6–24)
Bilirubin Total: <0.2 mg/dL (ref 0.0–1.2)
CO2: 24 mmol/L (ref 20–29)
Calcium: 9.5 mg/dL (ref 8.7–10.2)
Chloride: 99 mmol/L (ref 96–106)
Creatinine, Ser: 0.67 mg/dL (ref 0.57–1.00)
Globulin, Total: 2.9 g/dL (ref 1.5–4.5)
Glucose: 277 mg/dL — ABNORMAL HIGH (ref 70–99)
Potassium: 4.6 mmol/L (ref 3.5–5.2)
Sodium: 139 mmol/L (ref 134–144)
Total Protein: 7.3 g/dL (ref 6.0–8.5)
eGFR: 101 mL/min/{1.73_m2} (ref >59)

## 2021-09-23 LAB — HEPATITIS C ANTIBODY: Hep C Virus Ab: <0.1 s/co ratio (ref 0.0–0.9)

## 2021-09-23 LAB — LIPID PANEL
Chol/HDL Ratio: 3.6 ratio (ref 0.0–4.4)
Cholesterol, Total: 210 mg/dL — ABNORMAL HIGH (ref 100–199)
HDL: 59 mg/dL (ref >39)
LDL Chol Calc (NIH): 125 mg/dL — ABNORMAL HIGH (ref 0–99)
Triglycerides: 148 mg/dL (ref 0–149)
VLDL Cholesterol Cal: 26 mg/dL (ref 5–40)

## 2021-09-23 MED ORDER — ATORVASTATIN CALCIUM 80 MG PO TABS
80.0000 mg | ORAL_TABLET | Freq: Every day | ORAL | 3 refills | Status: AC
  Filled 2021-09-23: qty 90, 90d supply, fill #0

## 2021-09-23 NOTE — Assessment & Plan Note (Addendum)
Patient already on high intensity statin with atorvastatin.  Lipid panel today showed LDL of 125.  CMP shows normal LFTs. Lipid Panel     Component Value Date/Time   CHOL 210 (H) 09/22/2021 0954   TRIG 148 09/22/2021 0954   HDL 59 09/22/2021 0954   CHOLHDL 3.6 09/22/2021 0954   CHOLHDL 3.5 02/25/2020 0334   VLDL 38 02/25/2020 0334   LDLCALC 125 (H) 09/22/2021 0954   LABVLDL 26 09/22/2021 0954   Plan: -- Increase atorvastatin from 40 mg to 80 mg daily -- Continue with lifestyle modifications such as dietary changes and exercise

## 2021-09-23 NOTE — Assessment & Plan Note (Signed)
Patient's BP remains uncontrolled on lisinopril 5 mg daily and HCTZ 12.5 mg daily.  She remains asymptomatic.  Plan to consolidate patient's BP medications and increase dose with combination therapy. Labs today shows normal kidney function. Vitals:   09/22/21 0852  BP: (!) 156/68   Plan: -- Discontinue lisinopril and HCTZ -- Start lisinopril-HCTZ 20-12.5 mg daily -- Follow-up in 4 weeks for re-evaluation

## 2021-09-24 ENCOUNTER — Telehealth: Payer: Self-pay

## 2021-09-24 NOTE — Telephone Encounter (Signed)
PA  for pt ( [place] )  came though on cover my meds was done and sent back with office notes from 10/26...Marland Kitchen Awaiting approval or denial

## 2021-09-28 ENCOUNTER — Other Ambulatory Visit: Payer: Self-pay

## 2021-09-28 DIAGNOSIS — E1165 Type 2 diabetes mellitus with hyperglycemia: Secondary | ICD-10-CM

## 2021-09-28 MED ORDER — BLOOD GLUCOSE METER KIT: PACK | 0 refills | Status: DC

## 2021-09-28 NOTE — Progress Notes (Signed)
Internal Medicine Clinic Attending  Case discussed with Dr. [place]  At the time of the visit.  We reviewed the resident's history and exam and pertinent patient test results.  I agree with the assessment, diagnosis, and plan of care documented in the resident's note.  

## 2021-09-28 NOTE — Telephone Encounter (Signed)
DECISION :   Approvedon October 28  PA Case: [id number], Status: Approved,   Coverage Starts on: 09/24/2021 12:00:00 AM, Coverage Ends on: 03/23/2022 12:00:00 AM.   Drug Dexcom G6 Sensor Form [employer] [facility] Electronic [place] Form [phone] NCPDP)    ( COPY SENT TO PHARMACY ALSO )

## 2021-09-30 ENCOUNTER — Encounter (HOSPITAL_BASED_OUTPATIENT_CLINIC_OR_DEPARTMENT_OTHER): Payer: Medicaid Other | Attending: Internal Medicine | Admitting: Internal Medicine

## 2021-10-01 ENCOUNTER — Other Ambulatory Visit (HOSPITAL_COMMUNITY): Payer: Self-pay

## 2021-10-04 ENCOUNTER — Ambulatory Visit: Payer: Medicaid Other | Admitting: Podiatry

## 2021-10-04 ENCOUNTER — Other Ambulatory Visit (HOSPITAL_COMMUNITY): Payer: Self-pay

## 2021-10-07 ENCOUNTER — Encounter (HOSPITAL_BASED_OUTPATIENT_CLINIC_OR_DEPARTMENT_OTHER): Payer: Medicaid Other | Attending: Internal Medicine | Admitting: Internal Medicine

## 2021-10-07 ENCOUNTER — Other Ambulatory Visit: Payer: Self-pay

## 2021-10-07 DIAGNOSIS — E11621 Type 2 diabetes mellitus with foot ulcer: Secondary | ICD-10-CM | POA: Insufficient documentation

## 2021-10-07 DIAGNOSIS — Z794 Long term (current) use of insulin: Secondary | ICD-10-CM | POA: Insufficient documentation

## 2021-10-07 DIAGNOSIS — J45909 Unspecified asthma, uncomplicated: Secondary | ICD-10-CM | POA: Insufficient documentation

## 2021-10-07 DIAGNOSIS — E1165 Type 2 diabetes mellitus with hyperglycemia: Secondary | ICD-10-CM | POA: Diagnosis not present

## 2021-10-07 DIAGNOSIS — I10 Essential (primary) hypertension: Secondary | ICD-10-CM | POA: Diagnosis not present

## 2021-10-07 DIAGNOSIS — L97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 exposed: Secondary | ICD-10-CM | POA: Diagnosis not present

## 2021-10-07 DIAGNOSIS — M86171 Other acute osteomyelitis, right ankle and foot: Secondary | ICD-10-CM | POA: Insufficient documentation

## 2021-10-07 DIAGNOSIS — E1140 Type 2 diabetes mellitus with diabetic neuropathy, unspecified: Secondary | ICD-10-CM | POA: Diagnosis not present

## 2021-10-07 DIAGNOSIS — Z7984 Long term (current) use of oral hypoglycemic drugs: Secondary | ICD-10-CM | POA: Insufficient documentation

## 2021-10-07 DIAGNOSIS — E1151 Type 2 diabetes mellitus with diabetic peripheral angiopathy without gangrene: Secondary | ICD-10-CM | POA: Diagnosis not present

## 2021-10-08 NOTE — Progress Notes (Signed)
[name], [name] ([id number]) Visit Report for 10/07/2021 Arrival Information Details Patient Name: Date of Service: [name], [name] 10/07/2021 9:45 A M Medical Record Number: [id number] Patient Account Number: [id number] Date of Birth/Sex: Treating RN: [birth year] (60 y.o. [name], [name] [name] [name]: [name] Other Clinician: Referring [name]: Treating [name]/Extender: [name] in Treatment: 23 Visit Information History Since Last Visit Added or deleted any medications: No Patient Arrived: Ambulatory Any new allergies or adverse reactions: No Arrival Time: 10:08 Had a fall or experienced change in No Accompanied By: daughter activities of daily living that may affect Transfer Assistance: None risk of falls: Patient Requires Transmission-Based Precautions: No Signs or symptoms of abuse/neglect since last visito No Patient Has Alerts: Yes Hospitalized since last visit: No Patient Alerts: ABI R=1.18 Implantable device outside of the clinic excluding No ABI L=1.1 cellular tissue based products placed in the center since last visit: Has Dressing in Place as Prescribed: Yes Pain Present Now: No Electronic Signature(s) Signed: 10/07/2021 4:38:38 PM By: Debra Catholic RN Entered By: [name] on 10/07/2021 10:09:14 -------------------------------------------------------------------------------- Clinic Level of Rivera Assessment Details Patient Name: Date of Service: [name], [name] 10/07/2021 [place] Record Number: [id number] Patient Account Number: [id number] Date of Birth/Sex: Treating RN: 05-Feb-1961 (60 y.o. [name] [name] Chantea Surace: [name] Other Clinician: Referring [name]: Treating [name]/Extender: [name] in Treatment: 23 Clinic Level of Rivera Assessment Items TOOL 4 Quantity Score X- 1 0 Use when only an EandM is performed on FOLLOW-UP  visit ASSESSMENTS - Nursing Assessment / Reassessment X- 1 10 Reassessment of Co-morbidities (includes updates in patient status) X- 1 5 Reassessment of Adherence to Treatment Plan ASSESSMENTS - Wound and Skin A ssessment / Reassessment X - Simple Wound Assessment / Reassessment - one wound 1 5 []  - 0 Complex Wound Assessment / Reassessment - multiple wounds []  - 0 Dermatologic / Skin Assessment (not related to wound area) ASSESSMENTS - Focused Assessment []  - 0 Circumferential Edema Measurements - multi extremities []  - 0 Nutritional Assessment / Counseling / Intervention []  - 0 Lower Extremity Assessment (monofilament, tuning fork, pulses) []  - 0 Peripheral Arterial Disease Assessment (using hand held doppler) ASSESSMENTS - Ostomy and/or Continence Assessment and Rivera []  - 0 Incontinence Assessment and Management []  - 0 Ostomy Rivera Assessment and Management (repouching, etc.) PROCESS - Coordination of Rivera []  - 0 Simple Patient / Family Education for ongoing Rivera X- 1 20 Complex (extensive) Patient / Family Education for ongoing Rivera []  - 0 Staff obtains Programmer, systems, Records, T Results / Process Orders est []  - 0 Staff telephones HHA, Nursing Homes / Clarify orders / etc []  - 0 Routine Transfer to another Facility (non-emergent condition) []  - 0 Routine Hospital Admission (non-emergent condition) []  - 0 New Admissions / Biomedical engineer / Ordering NPWT Apligraf, etc. , []  - 0 Emergency Hospital Admission (emergent condition) X- 1 10 Simple Discharge Coordination []  - 0 Complex (extensive) Discharge Coordination PROCESS - Special Needs []  - 0 Pediatric / Minor Patient Management []  - 0 Isolation Patient Management []  - 0 Hearing / Language / Visual special needs []  - 0 Assessment of Community assistance (transportation, D/C planning, etc.) []  - 0 Additional assistance / Altered mentation []  - 0 Support Surface(s) Assessment (bed, cushion, seat,  etc.) INTERVENTIONS - Wound Cleansing / Measurement []  - 0 Simple Wound Cleansing - one wound []  - 0 Complex Wound Cleansing - multiple wounds X- 1 5 Wound Imaging (  photographs - any number of wounds) []  - 0 Wound Tracing (instead of photographs) []  - 0 Simple Wound Measurement - one wound []  - 0 Complex Wound Measurement - multiple wounds INTERVENTIONS - Wound Dressings []  - 0 Small Wound Dressing one or multiple wounds []  - 0 Medium Wound Dressing one or multiple wounds []  - 0 Large Wound Dressing one or multiple wounds []  - 0 Application of Medications - topical []  - 0 Application of Medications - injection INTERVENTIONS - Miscellaneous []  - 0 External ear exam []  - 0 Specimen Collection (cultures, biopsies, blood, body fluids, etc.) []  - 0 Specimen(s) / Culture(s) sent or taken to Lab for analysis []  - 0 Patient Transfer (multiple staff / Civil Service fast streamer / Similar devices) []  - 0 Simple Staple / Suture removal (25 or less) []  - 0 Complex Staple / Suture removal (26 or more) []  - 0 Hypo / Hyperglycemic Management (close monitor of Blood Glucose) []  - 0 Ankle / Brachial Index (ABI) - do not check if billed separately X- 1 5 Vital Signs Has the patient been seen at the hospital within the last three years: Yes Total Score: 60 Level Of Rivera: New/Established - Level 2 Electronic Signature(s) Signed: 10/07/2021 5:16:41 PM By: [name] Entered By: [name] on 10/07/2021 10:35:53 -------------------------------------------------------------------------------- Encounter Discharge Information Details Patient Name: Date of Service: [name], [name] 10/07/2021 9:45 A M Medical Record Number: [id number] Patient Account Number: [id number] Date of Birth/Sex: Treating RN: [birth year] (60 y.o. [name] [name] [name]: [name] Other Clinician: Referring Shermar Friedland: Treating [name]/Extender: [name] in Treatment:  23 Encounter Discharge Information Items Discharge Condition: Stable Ambulatory Status: Ambulatory Discharge Destination: Home Transportation: Private Auto Schedule Follow-up Appointment: No Clinical Summary of Rivera: Provided on 10/07/2021 Form Type Recipient Paper Patient Patient Electronic Signature(s) Signed: 10/07/2021 5:16:41 PM By: [name] Entered By: [name] on 10/07/2021 10:36:54 -------------------------------------------------------------------------------- Lower Extremity Assessment Details Patient Name: Date of Service: [name], [name] 10/07/2021 9:45 A M Medical Record Number: [id number] Patient Account Number: [id number] Date of Birth/Sex: Treating RN: 1961-05-24 (60 y.o. [name] [name] Alaster Asfaw: [name] Other Clinician: Referring Rola Lennon: Treating Annalynne Ibanez/Extender: [name] in Treatment: 23 Edema Assessment Assessed: [Left: No] [Right: No] Edema: [Left: N] [Right: o] Calf Left: Right: Point of Measurement: 27 cm From Medial Instep 42 cm Ankle Left: Right: Point of Measurement: 9 cm From Medial Instep 24 cm Vascular Assessment Pulses: Dorsalis Pedis Palpable: [Right:Yes] Electronic Signature(s) Signed: 10/07/2021 4:38:38 PM By: Debra Catholic RN Signed: 10/08/2021 12:27:53 PM By: Rhae Hammock RN Entered By: [name] on 10/07/2021 10:13:43 -------------------------------------------------------------------------------- Multi Wound Chart Details Patient Name: Date of Service: [name], [name] 10/07/2021 9:45 A M Medical Record Number: [id number] Patient Account Number: [id number] Date of Birth/Sex: Treating RN: 30-Mar-1961 (60 y.o. [name] [name] Aubreyanna Dorrough: [name] Other Clinician: Referring Sujey Gundry: Treating Tynetta Bachmann/Extender: [name] in Treatment: 23 Vital Signs Height(in): 73 Pulse(bpm): 99 Weight(lbs):  301 Blood Pressure(mmHg): 156/86 Body Mass Index(BMI): 40 Temperature(F): 99 Respiratory Rate(breaths/min): 18 Photos: [N/A:N/A] Right, Posterior T Great oe N/A N/A Wound Location: Gradually Appeared N/A N/A Wounding Event: Diabetic Wound/Ulcer of the Lower N/A N/A Debra Etiology: Extremity Anemia, Asthma, Hypertension, Type N/A N/A Comorbid History: II Diabetes, Osteomyelitis, Neuropathy, Confinement Anxiety 04/05/2021 N/A N/A Date Acquired: 82 N/A N/A Weeks of Treatment: Healed - Epithelialized N/A N/A Wound Status: 0x0x0 N/A N/A Measurements L x W x D (cm)  0 N/A N/A A (cm) : rea 0 N/A N/A Volume (cm) : 100.00% N/A N/A % Reduction in Area: 100.00% N/A N/A % Reduction in Volume: Grade 2 N/A N/A Classification: Treatment Notes Electronic Signature(s) Signed: 10/07/2021 12:31:06 PM By: Kalman Shan DO Signed: 10/08/2021 12:27:53 PM By: Rhae Hammock RN Entered By: Kalman Shan on 10/07/2021 12:27:46 -------------------------------------------------------------------------------- [place] Details Patient Name: Date of Service: [name], [name] 10/07/2021 9:45 A M Medical Record Number: [id number] Patient Account Number: [id number] Date of Birth/Sex: Treating RN: Mar 19, 1961 (60 y.o. [name] [name] Kinsleigh Ludolph: [name] Other Clinician: Referring Nicha Hemann: Treating Liam Cammarata/Extender: [name] in Treatment: [place] reviewed with physician Active Inactive Electronic Signature(s) Signed: 10/07/2021 5:16:41 PM By: [name] Entered By: [name] on 10/07/2021 10:35:00 -------------------------------------------------------------------------------- Pain Assessment Details Patient Name: Date of Service: [name], [name] 10/07/2021 9:45 A M Medical Record Number: [id number] Patient Account Number: [id number] Date of Birth/Sex: Treating  RN: 11-Mar-1961 (60 y.o. [name] [name] Armya Westerhoff: [name] Other Clinician: Referring Jarrell Armond: Treating Channin Agustin/Extender: [name] in Treatment: 23 Active Problems Location of Pain Severity and Description of Pain Patient Has Paino No Site Locations Pain Management and Medication Current Pain Management: Electronic Signature(s) Signed: 10/07/2021 4:38:38 PM By: Debra Catholic RN Signed: 10/08/2021 12:27:53 PM By: Rhae Hammock RN Entered By: [name] on 10/07/2021 10:12:21 -------------------------------------------------------------------------------- Patient/Caregiver Education Details Patient Name: Date of Service: [name], [name] 11/10/2022andnbsp9:45 [place] Record Number: [id number] Patient Account Number: [id number] Date of Birth/Gender: Treating RN: 01/21/1961 (60 y.o. [name] [name] Physician: [name] Other Clinician: Referring Physician: Treating Physician/Extender: [name] in Treatment: 23 Education Assessment Education Provided To: Patient Education Topics Provided Notes Healed, discharge instructions given Electronic Signature(s) Signed: 10/07/2021 5:16:41 PM By: [name] Entered By: [name] on 10/07/2021 10:35:19 -------------------------------------------------------------------------------- Wound Assessment Details Patient Name: Date of Service: [name], [name] 10/07/2021 9:45 A M Medical Record Number: [id number] Patient Account Number: [id number] Date of Birth/Sex: Treating RN: Oct 31, 1961 (60 y.o. [name], [name] [name] Darletta Noblett: [name] Other Clinician: Referring Cola Gane: Treating Adabella Stanis/Extender: [name] in Treatment: 23 Wound Status Wound Number: [id number] Debra Diabetic Wound/Ulcer of the Lower Extremity Etiology: Wound Location: Right, Posterior T  Great oe Wound Healed - Epithelialized Wounding Event: Gradually Appeared Status: Date Acquired: 04/05/2021 Comorbid Anemia, Asthma, Hypertension, Type II Diabetes, Osteomyelitis, Weeks Of Treatment: 23 History: Neuropathy, Confinement Anxiety Clustered Wound: No Photos Wound Measurements Length: (cm) Width: (cm) Depth: (cm) Area: (cm) Volume: (cm) 0 % Reduction in Area: 100% 0 % Reduction in Volume: 100% 0 0 0 Wound Description Classification: Grade 2 Electronic Signature(s) Signed: 10/07/2021 5:16:41 PM By: [name] Signed: 10/08/2021 12:27:53 PM By: Rhae Hammock RN Entered By: [name] on 10/07/2021 10:33:23 -------------------------------------------------------------------------------- Vitals Details Patient Name: Date of Service: [name], [facility] 10/07/2021 9:45 A M Medical Record Number: [id number] Patient Account Number: [id number] Date of Birth/Sex: Treating RN: 04-27-1961 (60 y.o. [name], [name] [name] Ryli Standlee: [name] Other Clinician: Referring Terin Dierolf: Treating Elanore Talcott/Extender: [name] in Treatment: 23 Vital Signs Time Taken: 10:09 Temperature (F): 99 Height (in): 73 Pulse (bpm): 99 Weight (lbs): 301 Respiratory Rate (breaths/min): 18 Body Mass Index (BMI): 39.7 Blood Pressure (mmHg): 156/86 Reference Range: 80 - 120 mg / dl Electronic Signature(s) Signed: 10/07/2021 4:38:38 PM By: Debra Catholic RN Entered By: [name] on 10/07/2021 10:12:08

## 2021-10-08 NOTE — Progress Notes (Signed)
[name], [name] ([id number]) Visit Report for 10/07/2021 Chief Complaint Document Details Patient Name: Date of Service: [name], [name] 10/07/2021 9:45 A M Medical Record Number: [id number] Patient Account Number: [id number] Date of Birth/Sex: Treating RN: [birth year] (60 y.o. [name] Primary Care Provider: Christiana Fuchs Other Clinician: Referring Provider: Treating Provider/Extender: Doran Stabler in Treatment: 23 Information Obtained from: Patient Chief Complaint Right great toe wound Electronic Signature(s) Signed: 10/07/2021 12:31:06 PM By: Kalman Shan DO Entered By: Kalman Shan on 10/07/2021 12:27:55 -------------------------------------------------------------------------------- HPI Details Patient Name: Date of Service: [name] 10/07/2021 9:45 A M Medical Record Number: [id number] Patient Account Number: [id number] Date of Birth/Sex: Treating RN: 06-Nov-1961 (60 y.o. [name] Primary Care Provider: Christiana Fuchs Other Clinician: Referring Provider: Treating Provider/Extender: Doran Stabler in Treatment: 23 History of Present Illness Location: notice some discharge and callus on the right big toe Quality: Patient reports experiencing a dull pain to affected area(s). Severity: Patient states wound(s) are getting worse. Duration: Patient has had the wound for < 2 weeks prior to presenting for treatment Timing: Pain in wound is Intermittent (comes and goes Context: The wound occurred when the patient was cutting her toenails and noticed some discharge from this area and was very concerned about it. Modifying Factors: Patient wound(s)/ulcer(s) are improving due to:he has seen her PCP who put her on Bactrim and Keflex antibiotic ssociated Signs and Symptoms: Patient reports having decrease discharge she started on her antibiotic. A HPI Description: [name] is a 60 year old  female with a past medical history of uncontrolled type 2 diabetes on insulin, osteomyelitis with amputations of 1st and 2nd toes of her left foot that presents to clinic for wound care to the right great toe wound. She states she noticed the wound 3 weeks ago. Not quite sure how it started. She has been using silver alginate to the area. She was recently hospitalized from a clinic visit in the internal medicine clinic for this issue. While hospitalized she had an MRI of the right foot that showed likely early acute osteomyelitis with possible septic arthritis. She was discharged on ciprofloxacin for 28 days. She currently does not deny pain increased warmth or erythema to the area. She has some drainage but not purulent. 6/9; patient presents for 1 week follow-up. She has no complaints today and denies signs of infection. She missed her infectious disease appointment yesterday but has it rescheduled for the 21st. She has been using silver alginate to the wound. 6/23; patient presents for 2-week follow-up. She followed up with Dr. Megan Rivera, infectious disease 2 days ago. She had some blood work done and was told to finish ciprofloxacin. She is having a follow-up virtual encounter next week. There has been an increase in callus development. She is unable to use her offloading shoe due to having to walk for long periods due to needing public transportation. She continues to use silver alginate to the wound. She denies signs of infection. 7/8; patient presents for follow-up. She missed her appointment last week. She reports increased callus to the surrounding wound area. She continues to use silver alginate. She denies signs of infection. 7/14; patient presents for 1 week follow-up. She has been using silver alginate to the wound bed. She has no issues or complaints today. She denies signs of infection. 7/28; patient presents for 2-week follow-up. She has been using silver alginate to the wound bed. She  has been moving to a new location over  the past week. She has no issues or complaints today. She denies signs of infection. 8/18; patient presents for follow-up. She was recently hospitalized for COVID and has not been able to follow-up for wound care. She was last seen 3 weeks ago. She reports receiving wound care while hospitalized. Since discharge of the hospital she is reported more drainage from the wound site. She denies signs of infection. 8/25; patient presents for follow-up. She has no issues or complaints today. She has been using silver alginate daily to the wound bed. She denies signs of infection. 9/15; patient presents for follow-up. She missed her last clinic appointment. She reports using silver alginate to the wound bed. She denies signs of infection. Due to transportation she has to walk for long periods of time. It is hard for the patient to offload this area well. 9/22; patient presents for follow-up. She has been using silver alginate to the wound bed. She reports a new wound to the distal portion of her right great toe. She states she stopped this last week. She has started Keflex for a wound culture done at last clinic visit. Due to cost she is not able to use the gentamicin cream prescribed. She states she is going to try different pharmacy to see if she can obtain this 10/6; patient presents for follow-up. She has been using gentamicin cream and silver alginate to the wound beds. The most distal portion of the right great toe has healed. She has no issues or complaints today. She denies signs of infection. 10/20; patient presents for follow-up. She has been using gentamicin cream and silver alginate to the wound bed and reports that it has closed over in the past week with callus. She removed part of her right great toenail and is following up with podiatry for this issue. She currently denies signs of infection. 11/10; patient presents for follow-up. She reports no drainage  in the past week. She has no issues or complaints today. She denies signs of infection. She has been keeping the toe covered. Electronic Signature(s) Signed: 10/07/2021 12:31:06 PM By: Kalman Shan DO Entered By: Kalman Shan on 10/07/2021 12:28:26 -------------------------------------------------------------------------------- Physical Exam Details Patient Name: Date of Service: [name], [name] 10/07/2021 9:45 A M Medical Record Number: [id number] Patient Account Number: [id number] Date of Birth/Sex: Treating RN: 31-Jan-1961 (60 y.o. [name] Primary Care Provider: Christiana Fuchs Other Clinician: Referring Provider: Treating Provider/Extender: Doran Stabler in Treatment: 23 Constitutional respirations regular, non-labored and within target range for patient.. Cardiovascular 2+ dorsalis pedis/posterior tibialis pulses. Psychiatric pleasant and cooperative. Notes Right great toe: T the plantar aspect there is no callus or wound present. Epithelialization to previous wound site. o Electronic Signature(s) Signed: 10/07/2021 12:31:06 PM By: Kalman Shan DO Entered By: Kalman Shan on 10/07/2021 12:29:07 -------------------------------------------------------------------------------- Physician Orders Details Patient Name: Date of Service: [name], [name] 10/07/2021 9:45 A M Medical Record Number: [id number] Patient Account Number: [id number] Date of Birth/Sex: Treating RN: 1960/12/23 (60 y.o. [name] Primary Care Provider: Christiana Fuchs Other Clinician: Referring Provider: Treating Provider/Extender: Doran Stabler in Treatment: 23 Verbal / Phone Orders: No Diagnosis Coding ICD-10 Coding Code Description E11.621 Type 2 diabetes mellitus with foot ulcer M86.171 Other acute osteomyelitis, right ankle and foot L97.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  exposed E11.65 Type 2 diabetes mellitus with hyperglycemia Discharge From [facility] Discharge from [place] - No further follow up needed  but call if any changes. Electronic Signature(s) Signed: 10/07/2021 12:31:06 PM By: Kalman Shan DO Entered By: Kalman Shan on 10/07/2021 12:29:21 -------------------------------------------------------------------------------- Problem List Details Patient Name: Date of Service: [name], [name] 10/07/2021 9:45 A M Medical Record Number: [id number] Patient Account Number: [id number] Date of Birth/Sex: Treating RN: [birth year] (60 y.o. [name] Primary Care Provider: Christiana Fuchs Other Clinician: Referring Provider: Treating Provider/Extender: Doran Stabler in Treatment: 23 Active Problems ICD-10 Encounter Code Description Active Date MDM Diagnosis E11.621 Type 2 diabetes mellitus with foot ulcer 04/29/2021 No Yes M86.171 Other acute osteomyelitis, right ankle and foot 04/29/2021 No Yes L97.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 exposed 04/29/2021 No Yes E11.65 Type 2 diabetes mellitus with hyperglycemia 04/29/2021 No Yes Inactive Problems Resolved Problems Electronic Signature(s) Signed: 10/07/2021 12:31:06 PM By: Kalman Shan DO Previous Signature: 10/07/2021 10:24:18 AM Version By: Lorrin Jackson Entered By: Kalman Shan on 10/07/2021 12:27:40 -------------------------------------------------------------------------------- Progress Note/History and Physical Details Patient Name: Date of Service: [name], [name] 10/07/2021 9:45 A M Medical Record Number: [id number] Patient Account Number: [id number] Date of Birth/Sex: Treating RN: 1961/06/08 (60 y.o. [name] Primary Care Provider: Christiana Fuchs Other Clinician: Referring Provider: Treating Provider/Extender: Doran Stabler in Treatment: 23 Subjective Chief  Complaint Information obtained from Patient Right great toe wound History of Present Illness (HPI) The following HPI elements were documented for the patient's wound: Location: notice some discharge and callus on the right big toe Quality: Patient reports experiencing a dull pain to affected area(s). Severity: Patient states wound(s) are getting worse. Duration: Patient has had the wound for < 2 weeks prior to presenting for treatment Timing: Pain in wound is Intermittent (comes and goes Context: The wound occurred when the patient was cutting her toenails and noticed some discharge from this area and was very concerned about it. Modifying Factors: Patient wound(s)/ulcer(s) are improving due to:he has seen her PCP who put her on Bactrim and Keflex antibiotic Associated Signs and Symptoms: Patient reports having decrease discharge she started on her antibiotic. Ms. Tyisha Cressy is a 60 year old female with a past medical history of uncontrolled type 2 diabetes on insulin, osteomyelitis with amputations of 1st and 2nd toes of her left foot that presents to clinic for wound care to the right great toe wound. She states she noticed the wound 3 weeks ago. Not quite sure how it started. She has been using silver alginate to the area. She was recently hospitalized from a clinic visit in the internal medicine clinic for this issue. While hospitalized she had an MRI of the right foot that showed likely early acute osteomyelitis with possible septic arthritis. She was discharged on ciprofloxacin for 28 days. She currently does not deny pain increased warmth or erythema to the area. She has some drainage but not purulent. 6/9; patient presents for 1 week follow-up. She has no complaints today and denies signs of infection. She missed her infectious disease appointment yesterday but has it rescheduled for the 21st. She has been using silver alginate to the wound. 6/23; patient presents for 2-week  follow-up. She followed up with Dr. Megan Rivera, infectious disease 2 days ago. She had some blood work done and was told to finish ciprofloxacin. She is having a follow-up virtual encounter next week. There has been an increase in callus development. She is unable to use her offloading shoe due to having to walk for long periods due to needing public transportation.  She continues to use silver alginate to the wound. She denies signs of infection. 7/8; patient presents for follow-up. She missed her appointment last week. She reports increased callus to the surrounding wound area. She continues to use silver alginate. She denies signs of infection. 7/14; patient presents for 1 week follow-up. She has been using silver alginate to the wound bed. She has no issues or complaints today. She denies signs of infection. 7/28; patient presents for 2-week follow-up. She has been using silver alginate to the wound bed. She has been moving to a new location over the past week. She has no issues or complaints today. She denies signs of infection. 8/18; patient presents for follow-up. She was recently hospitalized for COVID and has not been able to follow-up for wound care. She was last seen 3 weeks ago. She reports receiving wound care while hospitalized. Since discharge of the hospital she is reported more drainage from the wound site. She denies signs of infection. 8/25; patient presents for follow-up. She has no issues or complaints today. She has been using silver alginate daily to the wound bed. She denies signs of infection. 9/15; patient presents for follow-up. She missed her last clinic appointment. She reports using silver alginate to the wound bed. She denies signs of infection. Due to transportation she has to walk for long periods of time. It is hard for the patient to offload this area well. 9/22; patient presents for follow-up. She has been using silver alginate to the wound bed. She reports a new  wound to the distal portion of her right great toe. She states she stopped this last week. She has started Keflex for a wound culture done at last clinic visit. Due to cost she is not able to use the gentamicin cream prescribed. She states she is going to try different pharmacy to see if she can obtain this 10/6; patient presents for follow-up. She has been using gentamicin cream and silver alginate to the wound beds. The most distal portion of the right great toe has healed. She has no issues or complaints today. She denies signs of infection. 10/20; patient presents for follow-up. She has been using gentamicin cream and silver alginate to the wound bed and reports that it has closed over in the past week with callus. She removed part of her right great toenail and is following up with podiatry for this issue. She currently denies signs of infection. 11/10; patient presents for follow-up. She reports no drainage in the past week. She has no issues or complaints today. She denies signs of infection. She has been keeping the toe covered. Patient History Information obtained from Patient. Family History Diabetes - Father,Child, Hypertension - Mother,Father, No family history of Cancer, Heart Disease, Hereditary Spherocytosis, Kidney Disease, Lung Disease, Seizures, Stroke, Thyroid Problems, Tuberculosis. Social History Never smoker, Marital Status - Divorced, Alcohol Use - Never, Drug Use - No History, Caffeine Use - Rarely - soda. Medical History Eyes Denies history of Cataracts, Glaucoma, Optic Neuritis Ear/Nose/Mouth/Throat Denies history of Chronic sinus problems/congestion, Middle ear problems Hematologic/Lymphatic Patient has history of Anemia Denies history of Hemophilia, Human Immunodeficiency Virus, Lymphedema, Sickle Cell Disease Respiratory Patient has history of Asthma Denies history of Aspiration, Chronic Obstructive Pulmonary Disease (COPD), Pneumothorax, Sleep Apnea,  Tuberculosis Cardiovascular Patient has history of Hypertension Denies history of Angina, Arrhythmia, Congestive Heart Failure, Coronary Artery Disease, Deep Vein Thrombosis, Hypotension, Myocardial Infarction, Peripheral Arterial Disease, Peripheral Venous Disease, Phlebitis, Vasculitis Gastrointestinal Denies history of Cirrhosis , Colitis,  Crohnoos, Hepatitis A, Hepatitis B, Hepatitis C Endocrine Patient has history of Type II Diabetes Denies history of Type I Diabetes Genitourinary Denies history of End Stage Renal Disease Immunological Denies history of Lupus Erythematosus, Raynaudoos, Scleroderma Integumentary (Skin) Denies history of History of Burn Musculoskeletal Patient has history of Osteomyelitis - hx left great toe Denies history of Gout, Rheumatoid Arthritis, Osteoarthritis Neurologic Patient has history of Neuropathy Denies history of Dementia, Quadriplegia, Paraplegia, Seizure Disorder Oncologic Denies history of Received Chemotherapy, Received Radiation Psychiatric Patient has history of Confinement Anxiety Denies history of Anorexia/bulimia Patient is treated with Insulin, Oral Agents. Blood sugar is tested. Blood sugar results noted at the following times: Lunch - 120-160. Hospitalization/Surgery History - left second toe amputation. - left great toe amputation. - c-section x4. Medical A Surgical History Notes nd Gastrointestinal GERD Objective Constitutional respirations regular, non-labored and within target range for patient.. Vitals Time Taken: 10:09 AM, Height: 73 in, Weight: 301 lbs, BMI: 39.7, Temperature: 99 F, Pulse: 99 bpm, Respiratory Rate: 18 breaths/min, Blood Pressure: 156/86 mmHg. Cardiovascular 2+ dorsalis pedis/posterior tibialis pulses. Psychiatric pleasant and cooperative. General Notes: Right great toe: T the plantar aspect there is no callus or wound present. Epithelialization to previous wound site. o Integumentary (Hair,  Skin) Wound #13 status is Healed - Epithelialized. Original cause of wound was Gradually Appeared. The date acquired was: 04/05/2021. The wound has been in treatment 23 weeks. The wound is located on the [place]. The wound measures 0cm length x 0cm width x 0cm depth; 0cm^2 area and 0cm^3 oe volume. Assessment Active Problems ICD-10 Type 2 diabetes mellitus with foot ulcer Other acute osteomyelitis, right ankle and foot Non-pressure chronic ulcer of other part of right foot with fat layer exposed Type 2 diabetes mellitus with hyperglycemia Patient has done well with silver alginate. Her wound is closed. I recommended still offloading the area to help with further healing. She knows to call with any questions or concerns. She can follow-up as needed. Plan Discharge From [facility] Services: Discharge from [place] - No further follow up needed but call if any changes. 1. Discharge from clinic due to closed wound 2. Follow-up as needed Electronic Signature(s) Signed: 10/07/2021 12:31:06 PM By: Kalman Shan DO Entered By: Kalman Shan on 10/07/2021 12:30:34 -------------------------------------------------------------------------------- HxROS Details Patient Name: Date of Service: [name], [name] 10/07/2021 9:45 A M Medical Record Number: [id number] Patient Account Number: [id number] Date of Birth/Sex: Treating RN: [birth year] (60 y.o. [name] Primary Care Provider: Christiana Fuchs Other Clinician: Referring Provider: Treating Provider/Extender: Doran Stabler in Treatment: 23 Label Progress Note Print Version as History and Physical for this encounter Information Obtained From Patient Eyes Medical History: Negative for: Cataracts; Glaucoma; Optic Neuritis Ear/Nose/Mouth/Throat Medical History: Negative for: Chronic sinus problems/congestion; Middle ear problems Hematologic/Lymphatic Medical History: Positive for:  Anemia Negative for: Hemophilia; Human Immunodeficiency Virus; Lymphedema; Sickle Cell Disease Respiratory Medical History: Positive for: Asthma Negative for: Aspiration; Chronic Obstructive Pulmonary Disease (COPD); Pneumothorax; Sleep Apnea; Tuberculosis Cardiovascular Medical History: Positive for: Hypertension Negative for: Angina; Arrhythmia; Congestive Heart Failure; Coronary Artery Disease; Deep Vein Thrombosis; Hypotension; Myocardial Infarction; Peripheral Arterial Disease; Peripheral Venous Disease; Phlebitis; Vasculitis Gastrointestinal Medical History: Negative for: Cirrhosis ; Colitis; Crohns; Hepatitis A; Hepatitis B; Hepatitis C Past Medical History Notes: GERD Endocrine Medical History: Positive for: Type II Diabetes Negative for: Type I Diabetes Time with diabetes: 34 years Treated with: Insulin, Oral agents Blood sugar tested every day: Yes Tested : 2 times per  day Blood sugar testing results: Lunch: 120-160 Genitourinary Medical History: Negative for: End Stage Renal Disease Immunological Medical History: Negative for: Lupus Erythematosus; Raynauds; Scleroderma Integumentary (Skin) Medical History: Negative for: History of Burn Musculoskeletal Medical History: Positive for: Osteomyelitis - hx left great toe Negative for: Gout; Rheumatoid Arthritis; Osteoarthritis Neurologic Medical History: Positive for: Neuropathy Negative for: Dementia; Quadriplegia; Paraplegia; Seizure Disorder Oncologic Medical History: Negative for: Received Chemotherapy; Received Radiation Psychiatric Medical History: Positive for: Confinement Anxiety Negative for: Anorexia/bulimia Immunizations Pneumococcal Vaccine: Received Pneumococcal Vaccination: Yes Received Pneumococcal Vaccination On or After 60th Birthday: No Immunization Notes: pt. unsure Implantable Devices None Hospitalization / Surgery History Type of Hospitalization/Surgery left second toe  amputation left great toe amputation c-section x4 Family and Social History Cancer: No; Diabetes: Yes - Father,Child; Heart Disease: No; Hereditary Spherocytosis: No; Hypertension: Yes - Mother,Father; Kidney Disease: No; Lung Disease: No; Seizures: No; Stroke: No; Thyroid Problems: No; Tuberculosis: No; Never smoker; Marital Status - Divorced; Alcohol Use: Never; Drug Use: No History; Caffeine Use: Rarely - soda; Financial Concerns: No; Food, Clothing or Shelter Needs: No; Support System Lacking: No; Transportation Concerns: No Electronic Signature(s) Signed: 10/07/2021 12:31:06 PM By: Kalman Shan DO Signed: 10/08/2021 12:27:53 PM By: Rhae Hammock RN Entered By: Kalman Shan on 10/07/2021 12:28:34 -------------------------------------------------------------------------------- [place] Details Patient Name: Date of Service: [name], [name] 10/07/2021 Medical Record Number: [id number] Patient Account Number: [id number] Date of Birth/Sex: Treating RN: [birth year] (60 y.o. [name] Primary Care Provider: Christiana Fuchs Other Clinician: Referring Provider: Treating Provider/Extender: Doran Stabler in Treatment: 23 Diagnosis Coding ICD-10 Codes Code Description E11.621 Type 2 diabetes mellitus with foot ulcer M86.171 Other acute osteomyelitis, right ankle and foot L97.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 exposed E11.65 Type 2 diabetes mellitus with hyperglycemia Facility Procedures CPT4 Code: [id number] Description: [id number] - WOUND CARE VISIT-LEV 2 EST PT Modifier: Quantity: 1 Physician Procedures : CPT4 Code Description Modifier [id number] - WC PHYS LEVEL 3 - EST PT ICD-10 Diagnosis Description E11.621 Type 2 diabetes mellitus with foot ulcer M86.171 Other acute osteomyelitis, right ankle and foot L97.512 Non-pressure chronic ulcer of other  part of right foot with fat layer exposed E11.65 Type 2  diabetes mellitus with hyperglycemia Quantity: 1 Electronic Signature(s) Signed: 10/07/2021 12:31:06 PM By: Kalman Shan DO Entered By: Kalman Shan on 10/07/2021 12:30:47

## 2021-10-11 ENCOUNTER — Encounter: Payer: Medicaid Other | Admitting: Internal Medicine

## 2021-10-11 NOTE — Progress Notes (Deleted)
HTN Changed bp meds from lisinopril 5mg  and HCTZ 12.5 mg  Meds: Lisinopril- HCTZ 20-12.5 mg   HLD Increased atorvastatin from 40 to 80 mg  DM Meds: Metformin 1000 mg BID, Glargine 70 units, Humalog 18 units TID  Started Ozempic 0.25 mg, Increased to 0.55 mg    Vertigo Started meclizine 25 mg TID PRN

## 2021-10-25 ENCOUNTER — Other Ambulatory Visit (HOSPITAL_COMMUNITY): Payer: Self-pay

## 2021-10-26 ENCOUNTER — Telehealth: Payer: Self-pay

## 2021-10-26 NOTE — Telephone Encounter (Signed)
[place]  came through on cover my meds for Pt ( METFORMIN HCL ER )  was done and sent back with office notes from 10/26 awaiting approval or denial

## 2021-10-29 ENCOUNTER — Ambulatory Visit: Payer: Medicaid Other

## 2021-11-04 ENCOUNTER — Other Ambulatory Visit (HOSPITAL_COMMUNITY): Payer: Self-pay

## 2021-11-16 ENCOUNTER — Telehealth: Payer: Self-pay | Admitting: *Deleted

## 2021-11-16 ENCOUNTER — Encounter: Payer: Medicaid Other | Admitting: Internal Medicine

## 2021-11-17 ENCOUNTER — Other Ambulatory Visit: Payer: Self-pay

## 2021-11-17 ENCOUNTER — Other Ambulatory Visit (HOSPITAL_COMMUNITY)
Admission: RE | Admit: 2021-11-17 | Discharge: 2021-11-17 | Disposition: A | Discharge status: Home / Self Care | Payer: Medicaid Other | Source: Ambulatory Visit | Attending: Obstetrics and Gynecology | Admitting: Obstetrics and Gynecology

## 2021-11-17 ENCOUNTER — Encounter: Payer: Self-pay | Admitting: Obstetrics and Gynecology

## 2021-11-17 ENCOUNTER — Ambulatory Visit (INDEPENDENT_AMBULATORY_CARE_PROVIDER_SITE_OTHER): Payer: Medicaid Other | Admitting: Obstetrics and Gynecology

## 2021-11-17 DIAGNOSIS — Z01419 Encounter for gynecological examination (general) (routine) without abnormal findings: Secondary | ICD-10-CM | POA: Insufficient documentation

## 2021-11-17 DIAGNOSIS — N950 Postmenopausal bleeding: Secondary | ICD-10-CM | POA: Insufficient documentation

## 2021-11-17 MED ORDER — MEGESTROL ACETATE 40 MG PO TABS: 40.0000 mg | ORAL_TABLET | Freq: Two times a day (BID) | ORAL | 5 refills | Status: DC

## 2021-11-17 NOTE — Addendum Note (Signed)
Addended by: Lucianne Lei on: 11/17/2021 10:19 AM   Modules accepted: Orders

## 2021-11-17 NOTE — Patient Instructions (Addendum)
Health Maintenance, Female °Adopting a healthy lifestyle and getting preventive care are important in promoting health and wellness. Ask your health care provider about: °The right schedule for you to have regular tests and exams. °Things you can do on your own to prevent diseases and keep yourself healthy. °What should I know about diet, weight, and exercise? °Eat a healthy diet ° °Eat a diet that includes plenty of vegetables, fruits, low-fat dairy products, and lean protein. °Do not eat a lot of foods that are high in solid fats, added sugars, or sodium. °Maintain a healthy weight °Body mass index (BMI) is used to identify weight problems. It estimates body fat based on height and weight. Your health care provider can help determine your BMI and help you achieve or maintain a healthy weight. °Get regular exercise °Get regular exercise. This is one of the most important things you can do for your health. Most adults should: °Exercise for at least 150 minutes each week. The exercise should increase your heart rate and make you sweat (moderate-intensity exercise). °Do strengthening exercises at least twice a week. This is in addition to the moderate-intensity exercise. °Spend less time sitting. Even light physical activity can be beneficial. °Watch cholesterol and blood lipids °Have your blood tested for lipids and cholesterol at 59 years of age, then have this test every 5 years. °Have your cholesterol levels checked more often if: °Your lipid or cholesterol levels are high. °You are older than 59 years of age. °You are at high risk for heart disease. °What should I know about cancer screening? °Depending on your health history and family history, you may need to have cancer screening at various ages. This may include screening for: °Breast cancer. °Cervical cancer. °Colorectal cancer. °Skin cancer. °Lung cancer. °What should I know about heart disease, diabetes, and high blood pressure? °Blood pressure and heart  disease °High blood pressure causes heart disease and increases the risk of stroke. This is more likely to develop in people who have high blood pressure readings or are overweight. °Have your blood pressure checked: °Every 3-5 years if you are 18-39 years of age. °Every year if you are 40 years old or older. °Diabetes °Have regular diabetes screenings. This checks your fasting blood sugar level. Have the screening done: °Once every three years after age 40 if you are at a normal weight and have a low risk for diabetes. °More often and at a younger age if you are overweight or have a high risk for diabetes. °What should I know about preventing infection? °Hepatitis B °If you have a higher risk for hepatitis B, you should be screened for this virus. Talk with your health care provider to find out if you are at risk for hepatitis B infection. °Hepatitis C °Testing is recommended for: °Everyone born from 1945 through 1965. °Anyone with known risk factors for hepatitis C. °Sexually transmitted infections (STIs) °Get screened for STIs, including gonorrhea and chlamydia, if: °You are sexually active and are younger than 59 years of age. °You are older than 59 years of age and your health care provider tells you that you are at risk for this type of infection. °Your sexual activity has changed since you were last screened, and you are at increased risk for chlamydia or gonorrhea. Ask your health care provider if you are at risk. °Ask your health care provider about whether you are at high risk for HIV. Your health care provider may recommend a prescription medicine to help prevent HIV   infection. If you choose to take medicine to prevent HIV, you should first get tested for HIV. You should then be tested every 3 months for as long as you are taking the medicine. Pregnancy If you are about to stop having your period (premenopausal) and you may become pregnant, seek counseling before you get pregnant. Take 400 to 800  micrograms (mcg) of folic acid every day if you become pregnant. Ask for birth control (contraception) if you want to prevent pregnancy. Osteoporosis and menopause Osteoporosis is a disease in which the bones lose minerals and strength with aging. This can result in bone fractures. If you are 26 years old or older, or if you are at risk for osteoporosis and fractures, ask your health care provider if you should: Be screened for bone loss. Take a calcium or vitamin D supplement to lower your risk of fractures. Be given hormone replacement therapy (HRT) to treat symptoms of menopause. Follow these instructions at home: Alcohol use Do not drink alcohol if: Your health care provider tells you not to drink. You are pregnant, may be pregnant, or are planning to become pregnant. If you drink alcohol: Limit how much you have to: 0-1 drink a day. Know how much alcohol is in your drink. In the [place] one drink equals one 12 oz bottle of beer (355 mL), one 5 oz glass of wine (148 mL), or one 1 oz glass of hard liquor (44 mL). Lifestyle Do not use any products that contain nicotine or tobacco. These products include cigarettes, chewing tobacco, and vaping devices, such as e-cigarettes. If you need help quitting, ask your health care provider. Do not use street drugs. Do not share needles. Ask your health care provider for help if you need support or information about quitting drugs. General instructions Schedule regular health, dental, and eye exams. Stay current with your vaccines. Tell your health care provider if: You often feel depressed. You have ever been abused or do not feel safe at home. Summary Adopting a healthy lifestyle and getting preventive care are important in promoting health and wellness. Follow your health care provider's instructions about healthy diet, exercising, and getting tested or screened for diseases. Follow your health care provider's instructions on monitoring your  cholesterol and blood pressure. This information is not intended to replace advice given to you by your health care provider. Make sure you discuss any questions you have with your health care provider. Document Revised: 04/05/2021 Document Reviewed: 04/05/2021 Elsevier Patient Education  2022 [place]. Postmenopausal Bleeding Postmenopausal bleeding is any bleeding that occurs after menopause. Menopause is a time in a woman's life when monthly periods stop. Any type of bleeding after menopause should be checked by your doctor. Treatment will depend on the cause. This kind of bleeding can be caused by: Taking hormones during menopause. Low or high amounts of female hormones in the body. This can cause the lining of the womb (uterus) to become too thin or too thick. Cancer. Growths in the womb that are not cancer. Follow these instructions at home:  Watch for any changes in your symptoms. Let your doctor know about them. Avoid using tampons and douches as told by your doctor. Change your pads regularly. Get regular pelvic exams. This includes Pap tests. Take iron pills as told by your doctor. Take over-the-counter and prescription medicines only as told by your doctor. Keep all follow-up visits. Contact a doctor if: You have new bleeding from the vagina after menopause. You have pain in  your belly (abdomen). Get help right away if: You have a fever or chills. You have very bad pain with bleeding. You have clumps of blood (blood clots) coming from your vagina. You have a lot of bleeding, and: You use more than 1 pad an hour. This kind of bleeding has never happened before. You have headaches. You feel dizzy or you feel like you are going to pass out (faint). Summary Any type of bleeding after menopause should be checked by your doctor. Avoid using tampons or douches. Get regular pelvic exams. This includes Pap tests. Contact a doctor if you have new bleeding or pain in your  belly. Watch for any changes in your symptoms. Let your doctor know about them. This information is not intended to replace advice given to you by your health care provider. Make sure you discuss any questions you have with your health care provider. Document Revised: 04/30/2020 Document Reviewed: 04/30/2020 Elsevier Patient Education  [place].

## 2021-11-17 NOTE — Progress Notes (Signed)
GYN, Establish care. Last Pap 2012, Mammogram already ordered, pt needs to reschedule. Not sexually active, declines STI screen.

## 2021-11-17 NOTE — Progress Notes (Addendum)
Elona Yinger is a 60 y.o. [phone] female here for a routine annual gynecologic exam.  Current complaints: PMB for the last 6-7 months. Last cycle was 5 yrs ago. Not sexual active    Medical problems managed by PCP Mammogram ordered by PCP    Gynecologic History Patient's last menstrual period was 06/23/2014. Contraception: tubal ligation Last Pap: 2012. Results were: normal Last mammogram: Uncertain. Results were: normal  Obstetric History OB History  Gravida Para Term Preterm AB Living  _0 0 1 4  SAB IAB Ectopic Multiple Live Births  1 0 0 0 4    # Outcome Date GA Lbr Len/2nd Weight Sex Delivery Anes PTL Lv  5 Term           4 Term           3 Term           2 Term           1 SAB            LTCS x 4   Past Medical History:  Diagnosis Date   Anemia    Asthma 11/01/2010   BPPV (benign paroxysmal positional vertigo) 03/07/2013   Diabetes mellitus    since 1983   Elevated TSH 05/13/2013   GERD (gastroesophageal reflux disease)    Hypertension    Left elbow pain 05/05/2016   Left shoulder pain 05/05/2016   Osteomyelitis ([place])    Pneumonia    as a child   Wrist pain 04/11/2013    Past Surgical History:  Procedure Laterality Date   ACHILLES TENDON LENGTHENING Left 08/29/2014   Procedure: Left Achilles Lengthening;  Surgeon: Newt Minion, MD;  Location: [facility];  Service: Orthopedics;  Laterality: Left;   AMPUTATION Left 02/19/2013   Procedure: Amputation of Left Great Toe;  Surgeon: Mcarthur Rossetti, MD;  Location: [facility];  Service: Orthopedics;  Laterality: Left;   AMPUTATION Left 08/29/2014   Procedure: Left Foot 2nd and 1st Toe Amputation;  Surgeon: Newt Minion, MD;  Location: [facility];  Service: Orthopedics;  Laterality: Left;   CESAREAN SECTION     x 4   TUBAL LIGATION      Current Outpatient Medications on File Prior to Visit  Medication Sig Dispense Refill   atorvastatin (LIPITOR) 80 MG tablet Take 1 tablet (80 mg total) by mouth daily. 90 tablet 3    diclofenac Sodium (VOLTAREN) 1 % GEL Apply 4 g topically 4 (four) times daily. 350 g 1   gabapentin (NEURONTIN) 300 MG capsule Take 3 capsules (900 mg total) by mouth 3 (three) times daily. 270 capsule 6   insulin glargine (LANTUS SOLOSTAR) 100 UNIT/ML Solostar Pen Inject 70 Units into the skin at bedtime. 15 mL 3   insulin lispro (HUMALOG KWIKPEN) 100 UNIT/ML KwikPen Inject 18 Units into the skin 3 (three) times daily with meals. 15 mL 3   Insulin Pen Needle 31G X 5 MM MISC Use as directed with Lantus and Humalog 100 each PRN   lisinopril-hydrochlorothiazide (ZESTORETIC) 20-12.5 MG tablet Take 1 tablet by mouth daily. 90 tablet 0   metFORMIN (GLUCOPHAGE-XR) 500 MG 24 hr tablet Take 2 tablets (1,000 mg total) by mouth 2 (two) times daily with a meal. 120 tablet 3   Multiple Vitamin (MULTIVITAMIN WITH MINERALS) TABS tablet Take 1 tablet by mouth every evening.      omeprazole (PRILOSEC) 20 MG capsule Take 1 capsule (20 mg total) by mouth daily. 30 capsule  1   Semaglutide,0.25 or 0.5MG/DOS, (OZEMPIC, 0.25 OR 0.5 MG/DOSE,) 2 MG/1.5ML SOPN Inject 0.25 mg into the skin once a week. Inject 0.25 mg once weekly for 4 weeks, then increase to 0.5 mg once weekly for another 4 weeks. 1.5 mL 2   blood glucose meter kit and supplies KIT Dispense based on patient and insurance preference. Use up to four times daily as directed. (FOR ICD-9 250.00, 250.01). 1 each 0   blood glucose meter kit and supplies Dispense based on patient and insurance preference. Use up to four times daily as directed. (FOR ICD-10 E10.9, E11.9). 1 each 0   Continuous Blood Gluc Receiver (DEXCOM G6 RECEIVER) DEVI Check blood sugar at least 8 times a day 1 each 0   Continuous Blood Gluc Sensor (DEXCOM G6 SENSOR) MISC Check blood sugar at least 8 times a day 9 each 3   Continuous Blood Gluc Transmit (DEXCOM G6 TRANSMITTER) MISC Check blood sugar at least 8 times a day 1 each 3   gentamicin cream (GARAMYCIN) 0.1 % Apply 1 application topically  daily. 15 g 0   glucose blood (ONETOUCH VERIO) test strip Check blood sugar 3 times a day 300 each 3   Lancets (ONETOUCH DELICA PLUS [id number]) MISC Check blood sugar 3 times a day 300 each 3   meclizine (ANTIVERT) 25 MG tablet Take 1 tablet (25 mg total) by mouth 3 (three) times daily as needed for dizziness. (Patient not taking: Reported on 11/17/2021) 60 tablet 1   No current facility-administered medications on file prior to visit.    Allergies  Allergen Reactions   Aspirin Nausea And Vomiting   Ibuprofen Nausea And Vomiting and Other (See Comments)    Abdominal pain   Penicillins Other (See Comments)    Unknown Did it involve swelling of the face/tongue/throat, SOB, or low BP? Unk Did it involve sudden or severe rash/hives, skin peeling, or any reaction on the inside of your mouth or nose? Unk  Did you need to seek medical attention at a hospital or doctor's office? Unk When did it last happen? Childhood      If all above answers are "NO", may proceed with cephalosporin use.     Social History   Socioeconomic History   Marital status: Divorced    Spouse name: Not on file   Number of children: Not on file   Years of education: Not on file   Highest education level: Not on file  Occupational History   Not on file  Tobacco Use   Smoking status: Former    Types: Cigarettes    Quit date: 11/28/1962    Years since quitting: 59.0   Smokeless tobacco: Never  Vaping Use   Vaping Use: Never used  Substance and Sexual Activity   Alcohol use: No    Alcohol/week: 0.0 standard drinks   Drug use: No   Sexual activity: Not Currently  Other Topics Concern   Not on file  Social History Narrative   Not on file   Social Determinants of Health   Financial Resource Strain: Not on file  Food Insecurity: Not on file  Transportation Needs: Not on file  Physical Activity: Not on file  Stress: Not on file  Social Connections: Not on file  Intimate Partner Violence: Not on file     Family History  Problem Relation Age of Onset   Diabetes Father    Hypertension Father    Hypertension Mother    Dementia Mother  Diabetes Daughter     The following portions of the patient's history were reviewed and updated as appropriate: allergies, current medications, past family history, past medical history, past social history, past surgical history and problem list.  Review of Systems Pertinent items noted in HPI and remainder of comprehensive ROS otherwise negative.   Objective:  BP 122/77    Pulse 87    Ht _0  (1.854 m)    Wt 296 lb 3.2 oz (134.4 kg)    LMP 06/23/2014    BMI 39.08 kg/m   Chaperone present during exam  CONSTITUTIONAL: Well-developed, well-nourished female in no acute distress.  HENT:  Normocephalic, atraumatic, External right and left ear normal. Oropharynx is clear and moist EYES: Conjunctivae and EOM are normal. Pupils are equal, round, and reactive to light. No scleral icterus.  NECK: Normal range of motion, supple, no masses.  Normal thyroid.  SKIN: Skin is warm and dry. No rash noted. Not diaphoretic. No erythema. No pallor. [place]: Alert and oriented to person, place, and time. Normal reflexes, muscle tone coordination. No cranial nerve deficit noted. PSYCHIATRIC: Normal mood and affect. Normal behavior. Normal judgment and thought content. CARDIOVASCULAR: Normal heart rate noted, regular rhythm RESPIRATORY: Clear to auscultation bilaterally. Effort and breath sounds normal, no problems with respiration noted. BREASTS: Symmetric in size. No masses, skin changes, nipple drainage, or lymphadenopathy. ABDOMEN: Soft, normal bowel sounds, no distention noted.  No tenderness, rebound or guarding.  PELVIC: Normal appearing external genitalia; normal appearing vaginal mucosa and cervix.  No abnormal discharge noted.  Pap smear obtained.  Normal uterine size, no other palpable masses, no uterine or adnexal tenderness. Scant vaginal bleeding noted.  Exam limited by pt habitus. MUSCULOSKELETAL: Normal range of motion. No tenderness.  No cyanosis, clubbing, or edema.  2+ distal pulses.   Assessment:  Annual gynecologic examination with pap smear  PMB Plan:  Will follow up results of pap smear and manage accordingly. Mammogram scheduled by PCP. Will check GYN U/S. Start Megace 40 mg po bid Routine preventative health maintenance measures emphasized. Please refer to After Visit Summary for other counseling recommendations.    Chancy Milroy, MD, [place] Attending [place] for [facility], [place]

## 2021-11-18 ENCOUNTER — Ambulatory Visit (HOSPITAL_BASED_OUTPATIENT_CLINIC_OR_DEPARTMENT_OTHER): Admission: RE | Admit: 2021-11-18 | Payer: Medicaid Other | Source: Ambulatory Visit

## 2021-11-18 LAB — CERVICOVAGINAL ANCILLARY ONLY
Bacterial Vaginitis (gardnerella): NEGATIVE
Candida Glabrata: NEGATIVE
Candida Vaginitis: NEGATIVE
Comment: NEGATIVE
Comment: NEGATIVE
Comment: NEGATIVE

## 2021-11-19 LAB — CYTOLOGY - PAP
Comment: NEGATIVE
Diagnosis: NEGATIVE
High risk HPV: NEGATIVE

## 2021-11-24 ENCOUNTER — Ambulatory Visit (HOSPITAL_BASED_OUTPATIENT_CLINIC_OR_DEPARTMENT_OTHER): Payer: Medicaid Other

## 2021-11-26 ENCOUNTER — Other Ambulatory Visit: Payer: Self-pay | Admitting: Internal Medicine

## 2021-11-26 DIAGNOSIS — E1165 Type 2 diabetes mellitus with hyperglycemia: Secondary | ICD-10-CM

## 2021-12-09 ENCOUNTER — Other Ambulatory Visit: Payer: Self-pay

## 2021-12-09 ENCOUNTER — Emergency Department (HOSPITAL_COMMUNITY)
Admission: EM | Admit: 2021-12-09 | Discharge: 2021-12-09 | Disposition: A | Discharge status: Home / Self Care | Payer: Medicaid Other | Attending: Emergency Medicine | Admitting: Emergency Medicine

## 2021-12-09 ENCOUNTER — Encounter (HOSPITAL_COMMUNITY): Payer: Self-pay

## 2021-12-09 DIAGNOSIS — R42 Dizziness and giddiness: Secondary | ICD-10-CM | POA: Diagnosis present

## 2021-12-09 DIAGNOSIS — R61 Generalized hyperhidrosis: Secondary | ICD-10-CM | POA: Insufficient documentation

## 2021-12-09 DIAGNOSIS — E860 Dehydration: Secondary | ICD-10-CM | POA: Diagnosis not present

## 2021-12-09 DIAGNOSIS — Z20822 Contact with and (suspected) exposure to covid-19: Secondary | ICD-10-CM | POA: Diagnosis not present

## 2021-12-09 LAB — BASIC METABOLIC PANEL
Anion gap: 7 (ref 5–15)
BUN: 22 mg/dL — ABNORMAL HIGH (ref 6–20)
CO2: 21 mmol/L — ABNORMAL LOW (ref 22–32)
Calcium: 9.4 mg/dL (ref 8.9–10.3)
Chloride: 107 mmol/L (ref 98–111)
Creatinine, Ser: 1.1 mg/dL — ABNORMAL HIGH (ref 0.44–1.00)
GFR, Estimated: 58 mL/min — ABNORMAL LOW (ref >60)
Glucose, Bld: 132 mg/dL — ABNORMAL HIGH (ref 70–99)
Potassium: 4.8 mmol/L (ref 3.5–5.1)
Sodium: 135 mmol/L (ref 135–145)

## 2021-12-09 LAB — CBC WITH DIFFERENTIAL/PLATELET
Abs Immature Granulocytes: 0.02 10*3/uL (ref 0.00–0.07)
Basophils Absolute: 0 10*3/uL (ref 0.0–0.1)
Basophils Relative: 1 %
Eosinophils Absolute: 0.1 10*3/uL (ref 0.0–0.5)
Eosinophils Relative: 2 %
HCT: 38 % (ref 36.0–46.0)
Hemoglobin: 12.2 g/dL (ref 12.0–15.0)
Immature Granulocytes: 0 %
Lymphocytes Relative: 48 %
Lymphs Abs: 3.9 10*3/uL (ref 0.7–4.0)
MCH: 30.9 pg (ref 26.0–34.0)
MCHC: 32.1 g/dL (ref 30.0–36.0)
MCV: 96.2 fL (ref 80.0–100.0)
Monocytes Absolute: 0.3 10*3/uL (ref 0.1–1.0)
Monocytes Relative: 4 %
Neutro Abs: 3.6 10*3/uL (ref 1.7–7.7)
Neutrophils Relative %: 45 %
Platelets: 289 10*3/uL (ref 150–400)
RBC: 3.95 MIL/uL (ref 3.87–5.11)
RDW: 13.2 % (ref 11.5–15.5)
WBC: 8 10*3/uL (ref 4.0–10.5)
nRBC: 0 % (ref 0.0–0.2)

## 2021-12-09 LAB — RESP PANEL BY RT-PCR (FLU A&B, COVID) ARPGX2
Influenza A by PCR: NEGATIVE
Influenza B by PCR: NEGATIVE
SARS Coronavirus 2 by RT PCR: NEGATIVE

## 2021-12-09 LAB — TROPONIN I (HIGH SENSITIVITY)
Troponin I (High Sensitivity): <2 ng/L (ref <18)
Troponin I (High Sensitivity): <2 ng/L (ref <18)

## 2021-12-09 MED ORDER — SODIUM CHLORIDE 0.9 % IV BOLUS
1000.0000 mL | Freq: Once | INTRAVENOUS | Status: AC
  Administered 2021-12-09: 1000 mL via INTRAVENOUS

## 2021-12-09 NOTE — ED Provider Notes (Signed)
[facility] DEPT Provider Note   CSN: [id number] Arrival date & time: 12/09/21  0947     History  Chief Complaint  Patient presents with   Dizziness    [name] is a 61 y.o. female.  She is here with a complaint of dizziness lightheadedness that occurred this morning.  He was on and off throughout the morning.  Associated with diaphoresis.  Worsened as she was walking to go to the bus stop.  She said this has been going on for a few weeks and she attributed it to starting her Ozempic 2 months ago which caused her to have decreased appetite.  She has been trying to hydrate and eat in the mornings.  She said she had vertigo room spinning type dizziness that is been on and off since she acquired COVID last spring.  She was prescribed meclizine which she is only used a few times.  This felt different.  No fevers chills nausea vomiting.  No headache blurry vision numbness or weakness.  The history is provided by the patient.  Dizziness Quality:  Lightheadedness Severity:  Severe Onset quality:  Gradual Duration:  2 weeks Timing:  Intermittent Progression:  Unchanged Chronicity:  New Context: physical activity and standing up   Relieved by:  Nothing Worsened by:  Standing up Ineffective treatments:  Fluids and food Associated symptoms: no blood in stool, no chest pain, no diarrhea, no headaches, no nausea, no shortness of breath, no syncope, no vision changes and no vomiting   Risk factors: hx of vertigo and new medications       Home Medications Prior to Admission medications   Medication Sig Start Date End Date Taking? Authorizing Provider  ACCU-CHEK GUIDE test strip USE UP TO FOUR TIMES DAILY AS DIRECTED 12/01/21   Masters, [name], DO  atorvastatin (LIPITOR) 80 MG tablet Take 1 tablet (80 mg total) by mouth daily. 09/23/21   Lacinda Axon, MD  blood glucose meter kit and supplies KIT Dispense based on patient and insurance preference. Use up  to four times daily as directed. (FOR ICD-9 250.00, 250.01). 07/09/18   Jean Rosenthal, MD  blood glucose meter kit and supplies Dispense based on patient and insurance preference. Use up to four times daily as directed. (FOR ICD-10 E10.9, E11.9). 09/28/21   Masters, [name], DO  Continuous Blood Gluc Receiver (DEXCOM G6 RECEIVER) DEVI Check blood sugar at least 8 times a day 09/22/21   Masters, [name], DO  Continuous Blood Gluc Sensor (DEXCOM G6 SENSOR) MISC Check blood sugar at least 8 times a day 09/22/21   Masters, [name], DO  Continuous Blood Gluc Transmit (DEXCOM G6 TRANSMITTER) MISC Check blood sugar at least 8 times a day 09/22/21   Masters, [name], DO  diclofenac Sodium (VOLTAREN) 1 % GEL Apply 4 g topically 4 (four) times daily. 07/12/20   Madalyn Rob, MD  gabapentin (NEURONTIN) 300 MG capsule Take 3 capsules (900 mg total) by mouth 3 (three) times daily. 08/10/21   Sanjuan Dame, MD  gentamicin cream (GARAMYCIN) 0.1 % Apply 1 application topically daily. 08/17/21     insulin glargine (LANTUS SOLOSTAR) 100 UNIT/ML Solostar Pen Inject 70 Units into the skin at bedtime. 09/07/21   Masters, Katie, DO  insulin lispro (HUMALOG KWIKPEN) 100 UNIT/ML KwikPen Inject 18 Units into the skin 3 (three) times daily with meals. 09/07/21   Masters, [name], DO  Insulin Pen Needle 31G X 5 MM MISC Use as directed with Lantus and Humalog 09/07/21  Masters, Scientist, research (physical sciences), DO  Lancets (ONETOUCH DELICA PLUS [id number]) MISC Check blood sugar 3 times a day 09/22/21   Masters, [name], DO  lisinopril-hydrochlorothiazide (ZESTORETIC) 20-12.5 MG tablet Take 1 tablet by mouth daily. 09/22/21   Lacinda Axon, MD  meclizine (ANTIVERT) 25 MG tablet Take 1 tablet (25 mg total) by mouth 3 (three) times daily as needed for dizziness. Patient not taking: Reported on 11/17/2021 09/22/21   Lacinda Axon, MD  megestrol (MEGACE) 40 MG tablet Take 1 tablet (40 mg total) by mouth 2 (two) times daily. Can increase to two tablets twice a  day in the event of heavy bleeding 11/17/21   Chancy Milroy, MD  metFORMIN (GLUCOPHAGE-XR) 500 MG 24 hr tablet Take 2 tablets (1,000 mg total) by mouth 2 (two) times daily with a meal. 07/19/21   Masters, [name], DO  Multiple Vitamin (MULTIVITAMIN WITH MINERALS) TABS tablet Take 1 tablet by mouth every evening.     [provider]  omeprazole (PRILOSEC) 20 MG capsule Take 1 capsule (20 mg total) by mouth daily. 05/04/20   Katherine Roan, MD  Semaglutide,0.25 or 0.5MG/DOS, (OZEMPIC, 0.25 OR 0.5 MG/DOSE,) 2 MG/1.5ML SOPN Inject 0.25 mg into the skin once a week. Inject 0.25 mg once weekly for 4 weeks, then increase to 0.5 mg once weekly for another 4 weeks. 09/22/21   Lacinda Axon, MD      Allergies    Aspirin, Ibuprofen, and Penicillins    Review of Systems   Review of Systems  Constitutional:  Positive for diaphoresis. Negative for fever.  HENT:  Negative for sore throat.   Eyes:  Negative for visual disturbance.  Respiratory:  Negative for shortness of breath.   Cardiovascular:  Negative for chest pain and syncope.  Gastrointestinal:  Negative for abdominal pain, blood in stool, diarrhea, nausea and vomiting.  Genitourinary:  Negative for dysuria.  Musculoskeletal:  Negative for neck pain.  Skin:  Negative for rash.  Neurological:  Positive for dizziness and light-headedness. Negative for headaches.   Physical Exam Updated Vital Signs BP 123/72 (BP Location: Left Arm)    Pulse 89    Temp 98.3 F (36.8 C) (Oral)    Resp 18    Ht _0  (1.854 m)    Wt 129.3 kg    LMP 06/23/2014    SpO2 100%    BMI 37.60 kg/m  Physical Exam Vitals and nursing note reviewed.  Constitutional:      General: She is not in acute distress.    Appearance: Normal appearance. She is well-developed.  HENT:     Head: Normocephalic and atraumatic.  Eyes:     Conjunctiva/sclera: Conjunctivae normal.  Cardiovascular:     Rate and Rhythm: Normal rate and regular rhythm.     Heart sounds: No  murmur heard. Pulmonary:     Effort: Pulmonary effort is normal. No respiratory distress.     Breath sounds: Normal breath sounds.  Abdominal:     Palpations: Abdomen is soft.     Tenderness: There is no abdominal tenderness.  Musculoskeletal:        General: No swelling.     Cervical back: Neck supple.     Right lower leg: No edema.     Left lower leg: No edema.  Skin:    General: Skin is warm and dry.     Capillary Refill: Capillary refill takes less than 2 seconds.  Neurological:     General: No focal deficit present.  Mental Status: She is alert and oriented to person, place, and time.     Cranial Nerves: No cranial nerve deficit.     Sensory: No sensory deficit.     Motor: No weakness.     Gait: Gait normal.  Psychiatric:        Mood and Affect: Mood normal.    ED Results / Procedures / Treatments   Labs (all labs ordered are listed, but only abnormal results are displayed) Labs Reviewed  BASIC METABOLIC PANEL - Abnormal; Notable for the following components:      Result Value   CO2 21 (*)    Glucose, Bld 132 (*)    BUN 22 (*)    Creatinine, Ser 1.10 (*)    GFR, Estimated 58 (*)    All other components within normal limits  RESP PANEL BY RT-PCR (FLU A&B, COVID) ARPGX2  CBC WITH DIFFERENTIAL/PLATELET  TROPONIN I (HIGH SENSITIVITY)  TROPONIN I (HIGH SENSITIVITY)    EKG EKG Interpretation  Date/Time:  Thursday December 09 2021 08:49:14 EST Ventricular Rate:  87 PR Interval:  144 QRS Duration: 79 QT Interval:  334 QTC Calculation: 402 R Axis:   42 Text Interpretation: Sinus rhythm Low voltage, precordial leads No significant change since prior 3/21 Confirmed by Aletta Edouard [phone]) on 12/09/2021 8:51:46 AM  Radiology No results found.  Procedures Procedures    Medications Ordered in ED Medications  sodium chloride 0.9 % bolus 1,000 mL (0 mLs Intravenous Stopped 12/09/21 1556)    ED Course/ Medical Decision Making/ A&P Clinical Course as of  12/10/21 2703  Thu Dec 09, 2021  1113 Lab work showing low bicarb and elevated BUN and creatinine possibly reflecting some dehydration.  She is getting IV fluids. [MB]  1116 Orthostatic vital signs are negative [MB]    Clinical Course User Index [MB] Hayden Rasmussen, MD                           Medical Decision Making  This patient complains of dizziness lightheadedness diaphoresis; this involves an extensive number of treatment Options and is a complaint that carries with it a high risk of complications and Morbidity. The differential includes vertigo, hypovolemia, arrhythmia, ACS, metabolic derangement  I ordered, reviewed and interpreted labs, which included CBC with normal white count normal hemoglobin, chemistries with low bicarb elevated BUN and creatinine reflecting some dehydration, troponins flat, COVID and flu negative I ordered medication IV fluids with improvement in her symptoms Previous records obtained and reviewed in epic, did have a visit for vertigo in the past.  She says this feels different.   After the interventions stated above, I reevaluated the patient and found patient to be symptomatically improved.  She is ambulatory in the department without any difficulty.  Reviewed results of work-up with her.  She is comfortable plan for continued hydration and follow-up with her primary care doctor.  No indications for admission at this time.  Return instructions discussed         Final Clinical Impression(s) / ED Diagnoses Final diagnoses:  Dizziness  Dehydration    Rx / DC Orders ED Discharge Orders     None         Hayden Rasmussen, MD 12/10/21 [phone]

## 2021-12-09 NOTE — ED Notes (Signed)
Blood draw unsuccessful times one for repeat trop, md notified, will wait for fluids to finish and attempt to get sample from iv line.

## 2021-12-09 NOTE — ED Notes (Signed)
Pt stated feeling dizzy while doing orthostatics.

## 2021-12-09 NOTE — ED Triage Notes (Signed)
Pt to er room number 3 via ems, per ems pt is here for dizziness, states that she was on her way to work today and got dizzy, sat down and called 911, states that pt has a hx of vertigo but this feels different, pt state that her dizziness gets worse when she stands up.  Pt denies chest pain.

## 2021-12-09 NOTE — ED Notes (Signed)
Pt in bed, pt dressed and ready to go home, pt denies pain, pt states that she is ready to go home.

## 2021-12-09 NOTE — Discharge Instructions (Signed)
You are seen in the emergency department for evaluation of dizziness lightheadedness.  You had lab work done that showed you are somewhat dehydrated.  There was no evidence of heart attack.  Please keep well-hydrated.  Follow-up with your doctor.  Return to the emergency department if any worsening or concerning symptoms.

## 2021-12-13 ENCOUNTER — Ambulatory Visit: Payer: Medicaid Other | Admitting: Obstetrics and Gynecology

## 2021-12-13 ENCOUNTER — Ambulatory Visit (HOSPITAL_BASED_OUTPATIENT_CLINIC_OR_DEPARTMENT_OTHER): Payer: Medicaid Other

## 2021-12-16 ENCOUNTER — Ambulatory Visit
Admission: RE | Admit: 2021-12-16 | Discharge: 2021-12-16 | Disposition: A | Discharge status: Home / Self Care | Payer: Medicaid Other | Source: Ambulatory Visit | Attending: Internal Medicine | Admitting: Internal Medicine

## 2021-12-16 ENCOUNTER — Other Ambulatory Visit: Payer: Self-pay

## 2021-12-16 DIAGNOSIS — Z1231 Encounter for screening mammogram for malignant neoplasm of breast: Secondary | ICD-10-CM

## 2021-12-17 ENCOUNTER — Ambulatory Visit: Payer: Medicaid Other | Admitting: Obstetrics and Gynecology

## 2021-12-22 LAB — HM DIABETES EYE EXAM

## 2021-12-24 ENCOUNTER — Emergency Department (HOSPITAL_COMMUNITY)
Admission: EM | Admit: 2021-12-24 | Discharge: 2021-12-24 | Disposition: A | Discharge status: Home / Self Care | Payer: Medicaid Other | Attending: Emergency Medicine | Admitting: Emergency Medicine

## 2021-12-24 ENCOUNTER — Other Ambulatory Visit: Payer: Self-pay

## 2021-12-24 ENCOUNTER — Emergency Department (HOSPITAL_COMMUNITY): Payer: Medicaid Other

## 2021-12-24 DIAGNOSIS — R7989 Other specified abnormal findings of blood chemistry: Secondary | ICD-10-CM | POA: Insufficient documentation

## 2021-12-24 DIAGNOSIS — Z794 Long term (current) use of insulin: Secondary | ICD-10-CM | POA: Insufficient documentation

## 2021-12-24 DIAGNOSIS — I10 Essential (primary) hypertension: Secondary | ICD-10-CM | POA: Diagnosis not present

## 2021-12-24 DIAGNOSIS — D649 Anemia, unspecified: Secondary | ICD-10-CM | POA: Insufficient documentation

## 2021-12-24 DIAGNOSIS — W010XXA Fall on same level from slipping, tripping and stumbling without subsequent striking against object, initial encounter: Secondary | ICD-10-CM | POA: Diagnosis not present

## 2021-12-24 DIAGNOSIS — M25552 Pain in left hip: Secondary | ICD-10-CM | POA: Insufficient documentation

## 2021-12-24 DIAGNOSIS — E1165 Type 2 diabetes mellitus with hyperglycemia: Secondary | ICD-10-CM | POA: Diagnosis not present

## 2021-12-24 DIAGNOSIS — Z79899 Other long term (current) drug therapy: Secondary | ICD-10-CM | POA: Diagnosis not present

## 2021-12-24 DIAGNOSIS — Y990 Civilian activity done for income or pay: Secondary | ICD-10-CM | POA: Insufficient documentation

## 2021-12-24 DIAGNOSIS — E871 Hypo-osmolality and hyponatremia: Secondary | ICD-10-CM | POA: Insufficient documentation

## 2021-12-24 DIAGNOSIS — R739 Hyperglycemia, unspecified: Secondary | ICD-10-CM

## 2021-12-24 LAB — CBC WITH DIFFERENTIAL/PLATELET
Abs Immature Granulocytes: 0.03 10*3/uL (ref 0.00–0.07)
Basophils Absolute: 0 10*3/uL (ref 0.0–0.1)
Basophils Relative: 0 %
Eosinophils Absolute: 0.1 10*3/uL (ref 0.0–0.5)
Eosinophils Relative: 1 %
HCT: 34.1 % — ABNORMAL LOW (ref 36.0–46.0)
Hemoglobin: 11.4 g/dL — ABNORMAL LOW (ref 12.0–15.0)
Immature Granulocytes: 0 %
Lymphocytes Relative: 39 %
Lymphs Abs: 2.7 10*3/uL (ref 0.7–4.0)
MCH: 31.5 pg (ref 26.0–34.0)
MCHC: 33.4 g/dL (ref 30.0–36.0)
MCV: 94.2 fL (ref 80.0–100.0)
Monocytes Absolute: 0.4 10*3/uL (ref 0.1–1.0)
Monocytes Relative: 6 %
Neutro Abs: 3.8 10*3/uL (ref 1.7–7.7)
Neutrophils Relative %: 54 %
Platelets: 225 10*3/uL (ref 150–400)
RBC: 3.62 MIL/uL — ABNORMAL LOW (ref 3.87–5.11)
RDW: 12.9 % (ref 11.5–15.5)
WBC: 6.9 10*3/uL (ref 4.0–10.5)
nRBC: 0 % (ref 0.0–0.2)

## 2021-12-24 LAB — BASIC METABOLIC PANEL
Anion gap: 10 (ref 5–15)
BUN: 26 mg/dL — ABNORMAL HIGH (ref 6–20)
CO2: 21 mmol/L — ABNORMAL LOW (ref 22–32)
Calcium: 9.1 mg/dL (ref 8.9–10.3)
Chloride: 100 mmol/L (ref 98–111)
Creatinine, Ser: 1.56 mg/dL — ABNORMAL HIGH (ref 0.44–1.00)
GFR, Estimated: 38 mL/min — ABNORMAL LOW (ref >60)
Glucose, Bld: 511 mg/dL (ref 70–99)
Potassium: 4.8 mmol/L (ref 3.5–5.1)
Sodium: 131 mmol/L — ABNORMAL LOW (ref 135–145)

## 2021-12-24 LAB — CBG MONITORING, ED
Glucose-Capillary: 528 mg/dL (ref 70–99)
Glucose-Capillary: 433 mg/dL — ABNORMAL HIGH (ref 70–99)
Glucose-Capillary: 377 mg/dL — ABNORMAL HIGH (ref 70–99)

## 2021-12-24 MED ORDER — INSULIN ASPART 100 UNIT/ML IJ SOLN
0.0000 [IU] | Freq: Once | INTRAMUSCULAR | Status: AC
  Administered 2021-12-24: 15 [IU] via SUBCUTANEOUS

## 2021-12-24 MED ORDER — INSULIN GLARGINE-YFGN 100 UNIT/ML ~~LOC~~ SOLN
70.0000 [IU] | Freq: Once | SUBCUTANEOUS | Status: DC
Start: 2021-12-24 — End: 2021-12-24
  Filled 2021-12-24: qty 0.7

## 2021-12-24 MED ORDER — INSULIN GLARGINE-YFGN 100 UNIT/ML ~~LOC~~ SOLN
70.0000 [IU] | Freq: Once | SUBCUTANEOUS | Status: AC
  Administered 2021-12-24: 70 [IU] via SUBCUTANEOUS
  Filled 2021-12-24: qty 0.7

## 2021-12-24 MED ORDER — LANTUS SOLOSTAR 100 UNIT/ML ~~LOC~~ SOPN: 70.0000 [IU] | PEN_INJECTOR | Freq: Every day | SUBCUTANEOUS | 0 refills | Status: DC

## 2021-12-24 MED ORDER — SODIUM CHLORIDE 0.9 % IV BOLUS
1000.0000 mL | Freq: Once | INTRAVENOUS | Status: AC
  Administered 2021-12-24: 1000 mL via INTRAVENOUS

## 2021-12-24 MED ORDER — INSULIN ASPART 100 UNIT/ML IJ SOLN: 0.0000 [IU] | INTRAMUSCULAR | Status: DC

## 2021-12-24 NOTE — Progress Notes (Addendum)
Inpatient Diabetes Program Recommendations  AACE/ADA: New Consensus Statement on Inpatient Glycemic Control (2015)  Target Ranges:  Prepandial:   less than 140 mg/dL      Peak postprandial:   less than 180 mg/dL (1-2 hours)      Critically ill patients:  140 - 180 mg/dL   Lab Results  Component Value Date   GLUCAP 528 (HH) 12/24/2021   HGBA1C 9.6 (H) 06/30/2021    Review of Glycemic Control  Latest Reference Range & Units 12/24/21 11:55  Glucose-Capillary 70 - 99 mg/dL 528 (HH)  (HH): Data is critically high  Diabetes history: DM2 Outpatient Diabetes medications: Lantus 70 units QHS, Humalog 18 units TID, Metformin 1000 mg BID, Ozempic 0.25 -0.5 mg weekly Current orders for Inpatient glycemic control: none-In ED  Inpatient Diabetes Program Recommendations:    Semglee 70 units  Novolog 0-15 units Q4H   Please order for DC Lantus Solostar insulin pen-Order # [id number]  Spoke with patient on the phone.  PA was at bedside assessing.    Patient states she is current with Internal medicine and she has noticed she has been running out of her Lantus the last couple of months early.  She has not had Lantus in 2 days.  She spoke with the pharmacist and her prescription is written for 22 days.  She sees her PCP on Monday and will discuss this with them.    She checks her CBGs with her glucometer.  She has been trying to get the [facility] but the pharmacy says her insurance will not cover it.  Medicaid does cover the Dexcom.  She may need her PCP to send in a prior authorization form.  I explained this to her and she will bring this up to her PCP on Monday as well.  Will continue to follow while inpatient.  Thank you, Reche Dixon, MSN, RN Diabetes Coordinator Inpatient Diabetes Program [phone] (team pager from 8a-5p)

## 2021-12-24 NOTE — Discharge Instructions (Addendum)
At this time there does not appear to be the presence of an emergent medical condition, however there is always the potential for conditions to change. Please read and follow the below instructions.  Please return to the Emergency Department immediately for any new or worsening symptoms. Please be sure to follow up with your Primary Care Provider within one week regarding your visit today; please call their office to schedule an appointment even if you are feeling better for a follow-up visit. You may call the on-call orthopedic specialist Dr. Ninfa Linden for further evaluation treatment of your left hip pain.  If your left hip pain returns please use a walker or crutches to help keep the weight off of your hip until your follow-up appointment. Your blood sugar was elevated in the ER today.  Please have rechecked by your primary care provider this week.  Please really check your blood sugar levels at home.  If your blood sugar levels are persistently high or if you develop any symptoms of high blood sugar please return to the ER immediately Your kidney function levels were elevated today; this is likely due to dehydration.  Please be sure to maintain adequate water hydration and take your diabetes medications as prescribed.  Please have this rechecked by your primary care provider at your appointment on Monday.  Go to the nearest Emergency Department immediately if: You have fever or chills Your blood sugar monitor reads "high" even when you are taking insulin. You have trouble breathing. You have a change in how you think, feel, or act (mental status). You feel like you may vomit, and the feeling does not go away. You cannot stop vomiting. You have a sudden increase in pain and swelling in your hip. Your hip is red or swollen or very tender to touch. You have any new/concerning or worsening of symptoms   Please read the additional information packets attached to your discharge summary.  Do not  take your medicine if  develop an itchy rash, swelling in your mouth or lips, or difficulty breathing; call 911 and seek immediate emergency medical attention if this occurs.  You may review your lab tests and imaging results in their entirety on your MyChart account.  Please discuss all results of fully with your primary care provider and other specialist at your follow-up visit.  Note: Portions of this text may have been transcribed using voice recognition software. Every effort was made to ensure accuracy; however, inadvertent computerized transcription errors may still be present.

## 2021-12-24 NOTE — ED Notes (Signed)
Discharge instructions and prescription reviewed and explained, pt verbalized understanding.

## 2021-12-24 NOTE — ED Triage Notes (Signed)
Patient arrives via EMS due to falling at work. Per ems, the patient fell over a box at work and developed left hip pain that she rates at a 5/10. Patient did not have any abnormalities to hip noted and was ambulatory on scene.   Patient CBG was 543 with EMS. Patient has been out her lantus for two days. 500cc bolus infusing en route.   Alert and oriented on arrival, room air.

## 2021-12-24 NOTE — ED Provider Notes (Signed)
[facility] EMERGENCY DEPARTMENT Provider Note   CSN: [id number] Arrival date & time: 12/24/21  1129     History  Chief Complaint  Patient presents with   Fall   Hyperglycemia    [name] is a 61 y.o. female reports a history of diabetes and hypertension presented for left hip pain after fall at work.  Patient reports she tripped over a box falling directly onto the left hip she reports lateral left hip pain as aching moderate intensity constant worsened with palpation improves with rest, pain is nonradiating.  She denies injury of the head neck back chest abdomen pelvis upper extremities or right lower extremity.  She denies any pain at the left knee or ankle.  She denies blood thinner use or any additional injuries today.  Of note patient reports that she has been out of her insulin for the past few days as she "only receives 22-day prescription each month".  She denies any nausea/vomiting, increased urinary frequency or additional concerns.  HPI     Home Medications Prior to Admission medications   Medication Sig Start Date End Date Taking? Authorizing Provider  insulin glargine (LANTUS SOLOSTAR) 100 UNIT/ML Solostar Pen Inject 70 Units into the skin at bedtime. 12/24/21 01/23/22 Yes Koen Antilla, Erlene Quan A, PA-C  ACCU-CHEK GUIDE test strip USE UP TO FOUR TIMES DAILY AS DIRECTED 12/01/21   Masters, [name], DO  atorvastatin (LIPITOR) 80 MG tablet Take 1 tablet (80 mg total) by mouth daily. 09/23/21   Lacinda Axon, MD  blood glucose meter kit and supplies KIT Dispense based on patient and insurance preference. Use up to four times daily as directed. (FOR ICD-9 250.00, 250.01). 07/09/18   Jean Rosenthal, MD  blood glucose meter kit and supplies Dispense based on patient and insurance preference. Use up to four times daily as directed. (FOR ICD-10 E10.9, E11.9). 09/28/21   Masters, [name], DO  Continuous Blood Gluc Receiver (DEXCOM G6 RECEIVER) DEVI Check blood sugar  at least 8 times a day 09/22/21   Masters, [name], DO  Continuous Blood Gluc Sensor (DEXCOM G6 SENSOR) MISC Check blood sugar at least 8 times a day 09/22/21   Masters, [name], DO  Continuous Blood Gluc Transmit (DEXCOM G6 TRANSMITTER) MISC Check blood sugar at least 8 times a day 09/22/21   Masters, [name], DO  diclofenac Sodium (VOLTAREN) 1 % GEL Apply 4 g topically 4 (four) times daily. 07/12/20   Madalyn Rob, MD  gabapentin (NEURONTIN) 300 MG capsule Take 3 capsules (900 mg total) by mouth 3 (three) times daily. 08/10/21   Sanjuan Dame, MD  gentamicin cream (GARAMYCIN) 0.1 % Apply 1 application topically daily. 08/17/21     insulin lispro (HUMALOG KWIKPEN) 100 UNIT/ML KwikPen Inject 18 Units into the skin 3 (three) times daily with meals. 09/07/21   Masters, [name], DO  Insulin Pen Needle 31G X 5 MM MISC Use as directed with Lantus and Humalog 09/07/21   Masters, [name], DO  Lancets [facility] DELICA PLUS [id number]) MISC Check blood sugar 3 times a day 09/22/21   Masters, [name], DO  lisinopril-hydrochlorothiazide (ZESTORETIC) 20-12.5 MG tablet Take 1 tablet by mouth daily. 09/22/21   Lacinda Axon, MD  meclizine (ANTIVERT) 25 MG tablet Take 1 tablet (25 mg total) by mouth 3 (three) times daily as needed for dizziness. Patient not taking: Reported on 11/17/2021 09/22/21   Lacinda Axon, MD  megestrol (MEGACE) 40 MG tablet Take 1 tablet (40 mg total) by mouth 2 (two)  times daily. Can increase to two tablets twice a day in the event of heavy bleeding 11/17/21   Chancy Milroy, MD  metFORMIN (GLUCOPHAGE-XR) 500 MG 24 hr tablet Take 2 tablets (1,000 mg total) by mouth 2 (two) times daily with a meal. 07/19/21   Masters, [name], DO  Multiple Vitamin (MULTIVITAMIN WITH MINERALS) TABS tablet Take 1 tablet by mouth every evening.     [provider]  omeprazole (PRILOSEC) 20 MG capsule Take 1 capsule (20 mg total) by mouth daily. 05/04/20   Katherine Roan, MD  Semaglutide,0.25 or  0.5MG/DOS, (OZEMPIC, 0.25 OR 0.5 MG/DOSE,) 2 MG/1.5ML SOPN Inject 0.25 mg into the skin once a week. Inject 0.25 mg once weekly for 4 weeks, then increase to 0.5 mg once weekly for another 4 weeks. 09/22/21   Lacinda Axon, MD      Allergies    Aspirin, Ibuprofen, and Penicillins    Review of Systems   Review of Systems Ten systems are reviewed and are negative for acute change except as noted in the HPI  Physical Exam Updated Vital Signs BP (!) 141/76 (BP Location: Left Arm)    Pulse (!) 107    Temp 98.5 F (36.9 C) (Oral)    Resp 16    Ht _0  (1.854 m)    Wt 131.5 kg    LMP 06/23/2014    SpO2 99%    BMI 38.26 kg/m  Physical Exam Constitutional:      General: She is not in acute distress.    Appearance: Normal appearance. She is well-developed. She is not ill-appearing or diaphoretic.  HENT:     Head: Normocephalic and atraumatic.  Eyes:     General: Vision grossly intact. Gaze aligned appropriately.     Pupils: Pupils are equal, round, and reactive to light.  Neck:     Trachea: Trachea and phonation normal.  Pulmonary:     Effort: Pulmonary effort is normal. No respiratory distress.  Abdominal:     General: There is no distension.     Palpations: Abdomen is soft.     Tenderness: There is no abdominal tenderness. There is no guarding or rebound.  Musculoskeletal:        General: Normal range of motion.     Cervical back: Normal range of motion.     Comments: No midline spinal tenderness palpation.  No crepitus step-off or deformity of the spine.  No paraspinal muscular tenderness palpation.  No pain/instability with compression of the pelvis.  Full ROM and strength of all movements of the bilateral upper extremities without pain.  Full ROM and strength with all movements of the right lower extremity without pain.  Full ROM of the left ankle and left knee without pain.  No pain with ROM of the right hip including internal/external rotation flexion and extension.  She is  mildly tender to palpation of the left greater trochanter, no overlying skin changes.  Patient is neurovascular tact to all 4 extremities with strong equal distal pulses and sensation.  Compartments are soft.  Skin:    General: Skin is warm and dry.  Neurological:     Mental Status: She is alert.     GCS: GCS eye subscore is 4. GCS verbal subscore is 5. GCS motor subscore is 6.     Comments: Speech is clear and goal oriented, follows commands Major Cranial nerves without deficit, no facial droop Moves extremities without ataxia, coordination intact  Psychiatric:  Behavior: Behavior normal.    ED Results / Procedures / Treatments   Labs (all labs ordered are listed, but only abnormal results are displayed) Labs Reviewed  CBC WITH DIFFERENTIAL/PLATELET - Abnormal; Notable for the following components:      Result Value   RBC 3.62 (*)    Hemoglobin 11.4 (*)    HCT 34.1 (*)    All other components within normal limits  BASIC METABOLIC PANEL - Abnormal; Notable for the following components:   Sodium 131 (*)    CO2 21 (*)    Glucose, Bld 511 (*)    BUN 26 (*)    Creatinine, Ser 1.56 (*)    GFR, Estimated 38 (*)    All other components within normal limits  CBG MONITORING, ED - Abnormal; Notable for the following components:   Glucose-Capillary 528 (*)    All other components within normal limits  CBG MONITORING, ED - Abnormal; Notable for the following components:   Glucose-Capillary 433 (*)    All other components within normal limits  CBG MONITORING, ED - Abnormal; Notable for the following components:   Glucose-Capillary 377 (*)    All other components within normal limits    EKG None  Radiology DG Hip Unilat W or Wo Pelvis 2-3 Views Left  Result Date: 12/24/2021 CLINICAL DATA:  Fall, hip pain EXAM: DG HIP (WITH OR WITHOUT PELVIS) 2-3V LEFT COMPARISON:  None. FINDINGS: No acute fracture or dislocation identified. Mild hip joint space narrowing with early marginal  osteophytes. No fracture identified in the pelvis. IMPRESSION: No acute osseous abnormality identified. Electronically Signed   By: Ofilia Neas M.D.   On: 12/24/2021 12:26    Procedures Procedures    Medications Ordered in ED Medications  sodium chloride 0.9 % bolus 1,000 mL (0 mLs Intravenous Stopped 12/24/21 1530)  insulin aspart (novoLOG) injection 0-15 Units (15 Units Subcutaneous Given 12/24/21 1437)  insulin glargine-yfgn (SEMGLEE) injection 70 Units (70 Units Subcutaneous Given 12/24/21 1518)    ED Course/ Medical Decision Making/ A&P                           Medical Decision Making 61 year old female presented for left hip pain after fall at work today.  She has pain only at the left hip denies any other injuries including injuries to head neck back chest abdomen or other 3 extremities.  She has no leg length shortening or rotation.  She is neurovascular intact to all 4 extremities.  No overlying skin changes on exam of the left hip.  Pain is gradually improving which is reassuring.  She is no pain or injury of the low back or pelvis, no neurologic complaint.  Will obtain x-ray of the left hip and reassess.  Additionally patient reports has been out of her insulin for several days, she has no complaints concerning for hyperglycemic crisis however will obtain basic labs including CBG.  Amount and/or Complexity of Data Reviewed Labs: ordered.    Details: CBC shows mild anemia of 11.4.  No thrombocytopenia or leukocytosis. BMP shows hyponatremia of 131 suspect secondary to hyperglycemia (Corrected Sodium level 138 mEq/L).  Creatinine elevated at 1.56, BUN 26 suspect due to dehydration and noncompliance with diabetes medication.  IV fluids given.  Patient encouraged to take medications as prescribed and maintain water hydration and this will be rechecked at her PCP office on Monday.  Normal gap. Bicarp 21, same as 2 weeks ago.  Initial glucose  [facility]: ordered.    Details: Three-view x-ray of the left hip: No acute fracture or dislocation identified.  I agree with radiologist interpretation above.  Risk OTC drugs. Prescription drug management.  ---------------- Throughout ED visit patient's left hip pain has gradually improved she reports minimal pain on reassessment.  She is ambulating around the emergency room without assistance or difficulty.  No pain with flexion/extension or internal/external rotation.  I advised the patient use of crutches or walker as needed to help keep the weight off of the left hip and to follow-up with on-call orthopedist Dr. Ninfa Linden for recheck.  As to patient's elevated blood sugar level the diabetes coordinator reached out today and has ordered long-acting and short acting insulin for the patient.  Her home medications were refilled.  Patient encouraged to follow-up with PCP for recheck of blood sugar levels this week.  IV fluids were given for suspected dehydration.  Patient has no symptoms suggestive of DKA or hyperglycemic crisis at this time.  We discussed signs and symptoms of hyperglycemic emergencies and patient stated understanding and will return if they occur.  Of note patient's family member brought her chicken and potatoes in the ED.  Patient advised that this will elevate blood sugar levels, education was given.  Suspect with patient's continued oral intake and water hydration mild AKI should improve.  Patient will have this rechecked on Monday with her PCP at that appointment.  Discussed with Dr. Tomi Bamberger who agrees no indication for admission and close PCP follow-up recommended.  Per diabetes coordinator Solostar insulin pen prescription given for 70 units nightly.  Reviewed patient's most recent PCP visit, shows this was prior dose of insulin glargine.  At this time there does not appear to be any evidence of an acute emergency medical condition and the patient appears stable for discharge with  appropriate outpatient follow up. Diagnosis was discussed with patient who verbalizes understanding of care plan and is agreeable to discharge. I have discussed return precautions with patient and family who verbalizes understanding. Patient encouraged to follow-up with their PCP. All questions answered.  Patient's case discussed with Dr. Tomi Bamberger who agrees with plan to discharge with follow-up.   Note: Portions of this report may have been transcribed using voice recognition software. Every effort was made to ensure accuracy; however, inadvertent computerized transcription errors may still be present.         Final Clinical Impression(s) / ED Diagnoses Final diagnoses:  Left hip pain  Hyperglycemia  Elevated serum creatinine    Rx / DC Orders ED Discharge Orders          Ordered    insulin glargine (LANTUS SOLOSTAR) 100 UNIT/ML Solostar Pen  Daily at bedtime        12/24/21 1517              Gari Crown 12/24/21 1547    Dorie Rank, MD 12/25/21 [phone]

## 2021-12-27 ENCOUNTER — Ambulatory Visit: Payer: Medicaid Other | Admitting: Internal Medicine

## 2021-12-27 ENCOUNTER — Telehealth: Payer: Self-pay | Admitting: *Deleted

## 2021-12-27 ENCOUNTER — Telehealth: Payer: Self-pay | Admitting: Dietician

## 2021-12-27 ENCOUNTER — Encounter: Payer: Self-pay | Admitting: Dietician

## 2021-12-27 ENCOUNTER — Ambulatory Visit: Payer: Medicaid Other | Admitting: Dietician

## 2021-12-27 ENCOUNTER — Encounter: Payer: Self-pay | Admitting: Internal Medicine

## 2021-12-27 ENCOUNTER — Other Ambulatory Visit: Payer: Self-pay

## 2021-12-27 VITALS — BP 107/84 | HR 104 | Temp 98.5°F | Resp 24 | Ht 73.0 in | Wt 280.2 lb

## 2021-12-27 DIAGNOSIS — E1165 Type 2 diabetes mellitus with hyperglycemia: Secondary | ICD-10-CM

## 2021-12-27 DIAGNOSIS — E1159 Type 2 diabetes mellitus with other circulatory complications: Secondary | ICD-10-CM | POA: Diagnosis not present

## 2021-12-27 DIAGNOSIS — R42 Dizziness and giddiness: Secondary | ICD-10-CM

## 2021-12-27 DIAGNOSIS — I152 Hypertension secondary to endocrine disorders: Secondary | ICD-10-CM

## 2021-12-27 LAB — GLUCOSE, CAPILLARY: Glucose-Capillary: 298 mg/dL — ABNORMAL HIGH (ref 70–99)

## 2021-12-27 LAB — POCT GLYCOSYLATED HEMOGLOBIN (HGB A1C): Hemoglobin A1C: 10.2 % — AB (ref 4.0–5.6)

## 2021-12-27 MED ORDER — OZEMPIC (0.25 OR 0.5 MG/DOSE) 2 MG/1.5ML ~~LOC~~ SOPN: 1.0000 mg | PEN_INJECTOR | SUBCUTANEOUS | 3 refills | Status: DC

## 2021-12-27 MED ORDER — SEMAGLUTIDE (1 MG/DOSE) 4 MG/3ML ~~LOC~~ SOPN: 1.0000 mg | PEN_INJECTOR | SUBCUTANEOUS | 4 refills | Status: DC

## 2021-12-27 MED ORDER — LISINOPRIL 20 MG PO TABS: 20.0000 mg | ORAL_TABLET | Freq: Every day | ORAL | 11 refills | Status: DC

## 2021-12-27 NOTE — Progress Notes (Signed)
CC: dizziness and diabetes follow up  HPI:  [name] is a 61 y.o. female with a past medical history stated below and presents today for dizziness and diabetes follow up. Please see problem based assessment and plan for additional details.  Past Medical History:  Diagnosis Date   Anemia    Asthma 11/01/2010   BPPV (benign paroxysmal positional vertigo) 03/07/2013   Diabetes mellitus    since 1983   Elevated TSH 05/13/2013   GERD (gastroesophageal reflux disease)    Hypertension    Left elbow pain 05/05/2016   Left shoulder pain 05/05/2016   Osteomyelitis ([place])    Pneumonia    as a child   Wrist pain 04/11/2013    Current Outpatient Medications on File Prior to Visit  Medication Sig Dispense Refill   ACCU-CHEK GUIDE test strip USE UP TO FOUR TIMES DAILY AS DIRECTED 100 each 3   atorvastatin (LIPITOR) 80 MG tablet Take 1 tablet (80 mg total) by mouth daily. 90 tablet 3   blood glucose meter kit and supplies KIT Dispense based on patient and insurance preference. Use up to four times daily as directed. (FOR ICD-9 250.00, 250.01). 1 each 0   blood glucose meter kit and supplies Dispense based on patient and insurance preference. Use up to four times daily as directed. (FOR ICD-10 E10.9, E11.9). 1 each 0   Continuous Blood Gluc Receiver (DEXCOM G6 RECEIVER) DEVI Check blood sugar at least 8 times a day 1 each 0   Continuous Blood Gluc Sensor (DEXCOM G6 SENSOR) MISC Check blood sugar at least 8 times a day 9 each 3   Continuous Blood Gluc Transmit (DEXCOM G6 TRANSMITTER) MISC Check blood sugar at least 8 times a day 1 each 3   diclofenac Sodium (VOLTAREN) 1 % GEL Apply 4 g topically 4 (four) times daily. 350 g 1   gabapentin (NEURONTIN) 300 MG capsule Take 3 capsules (900 mg total) by mouth 3 (three) times daily. 270 capsule 6   gentamicin cream (GARAMYCIN) 0.1 % Apply 1 application topically daily. 15 g 0   insulin glargine (LANTUS SOLOSTAR) 100 UNIT/ML Solostar Pen Inject 70  Units into the skin at bedtime. 21 mL 0   insulin lispro (HUMALOG KWIKPEN) 100 UNIT/ML KwikPen Inject 18 Units into the skin 3 (three) times daily with meals. 15 mL 3   Insulin Pen Needle 31G X 5 MM MISC Use as directed with Lantus and Humalog 100 each PRN   Lancets (ONETOUCH DELICA PLUS [id number]) MISC Check blood sugar 3 times a day 300 each 3   lisinopril-hydrochlorothiazide (ZESTORETIC) 20-12.5 MG tablet Take 1 tablet by mouth daily. 90 tablet 0   meclizine (ANTIVERT) 25 MG tablet Take 1 tablet (25 mg total) by mouth 3 (three) times daily as needed for dizziness. (Patient not taking: Reported on 11/17/2021) 60 tablet 1   megestrol (MEGACE) 40 MG tablet Take 1 tablet (40 mg total) by mouth 2 (two) times daily. Can increase to two tablets twice a day in the event of heavy bleeding 60 tablet 5   metFORMIN (GLUCOPHAGE-XR) 500 MG 24 hr tablet Take 2 tablets (1,000 mg total) by mouth 2 (two) times daily with a meal. 120 tablet 3   Multiple Vitamin (MULTIVITAMIN WITH MINERALS) TABS tablet Take 1 tablet by mouth every evening.      omeprazole (PRILOSEC) 20 MG capsule Take 1 capsule (20 mg total) by mouth daily. 30 capsule 1   Semaglutide,0.25 or 0.5MG/DOS, (OZEMPIC, 0.25 OR 0.5 MG/DOSE,)  2 MG/1.5ML SOPN Inject 0.25 mg into the skin once a week. Inject 0.25 mg once weekly for 4 weeks, then increase to 0.5 mg once weekly for another 4 weeks. 1.5 mL 2   No current facility-administered medications on file prior to visit.    Family History  Problem Relation Age of Onset   Diabetes Father    Hypertension Father    Hypertension Mother    Dementia Mother    Diabetes Daughter     Social History: Works here at [employer] in [employer]. Works 16hrs per week.   Review of Systems: ROS negative except for what is noted on the assessment and plan.  Vitals:   12/27/21 1013  BP: 107/84  Pulse: (!) 104  Resp: (!) 24  Temp: 98.5 F (36.9 C)  TempSrc: Oral  SpO2: 99%  Weight: 280 lb 3.2 oz (127.1 kg)   Height: '6\' 1"'  (1.854 m)     Physical Exam: General: Well appearing obese African-American female, NAD HENT: normocephalic, atraumatic, moist mucous membranes EYES: conjunctiva non-erythematous, no scleral icterus, EOM intact, no vertical nystagmus or diplopia CV: Tachycardic, normal rhythm, no murmurs, rubs, gallops. Pulmonary: normal work of breathing on RA, lungs clear to auscultation, no rales, wheezes, rhonchi Abdominal: non-distended, soft, non-tender to palpation, normal BS Skin: Warm and dry, no rashes or lesions Neurological: MS: awake, alert and oriented x3, normal speech and fund of knowledge Motor: moves all extremities antigravity Psych: normal affect    Assessment & Plan:   See Encounters Tab for problem based charting.  Patient discussed with Dr. Georgana Curio, M.D. [facility], PGY-1 Pager: [phone] Date 12/27/2021 Time 10:26 AM

## 2021-12-27 NOTE — Assessment & Plan Note (Addendum)
Last hemoglobin A1c August 2022 9.6%.  Notably patient had CBGs elevated in the ED 1/27 to 528.  A1c today 10.2% and blood glucose 298.  Current medications include metformin 2000 mg, glargine 70 units nightly, Humalog 18 units with meals, Ozempic 0.5 mg Sundays.  Patient reports adherence to medications though patient missed about a week of long-acting insulin in January since she was out of medication and had no refills.  This was refilled at her ED visit on 127.  Patient reports reduced appetite, eating smaller portions, eating mostly salads and sandwiches, mindful of her carbohydrate intake, denies sweets and sweetened beverages.  Reports her home blood sugars fasting less than 150.  Does not have glucose meter with her today.  Reports ~20 pound weight loss which is consistent with chart review.  Denies polydipsia, polyuria.  Patient is very frustrated when she hears her hemoglobin A1c has increased, does not understand why since she has adhere to diet and medications. Has previously met with [name] to discuss continuous glucose monitor though this was not replaced.  On assessment, blood sugars remain uncontrolled on current regimen.  We will increase Ozempic from 0.5 to 1.0 mg today.  Discussed with [name] who was kind enough to see patient today during her visit.  We were able to place continuous glucose monitor today while in clinic.  Plan: -Continue metformin 1000 mg twice daily -Continue glargine 70 units nightly -Continue Humalog 18 units with meals -Increase Ozempic to 1.0 mg weekly -CGM placed today -Follow-up in 3 months for A1c check and continuous glucose monitor check -Ambulatory referral to diabetes and nutrition services

## 2021-12-27 NOTE — Telephone Encounter (Signed)
Received call from [facility]. States Rx for Ozempic will need to be resent for 1 mg dose. They do have this in stock.

## 2021-12-27 NOTE — Patient Instructions (Signed)
Thank you, [name] for allowing [name] to provide your care today. Today we discussed:  Dizziness: We will take you off of the lisinopril-hydrochlorothiazide combo medication.  We will prescribe you lisinopril 20 mg only and discontinue the hydrochlorothiazide portion of the medication for now.  Diabetes: Your hemoglobin A1c today was elevated to 10.2%.  We will increase your Ozempic to 1.0, continue taking your metformin, long-acting insulin 70 units daily, Humalog 14 units with meals.  We will have [name] come speak with you to talk about continuous glucose monitoring so we can get a better idea of what your blood sugars look like on a daily basis.  I have ordered the following labs for you:   Lab Orders         Glucose, capillary         POC Hbg A1C       My Chart Access: [email or web address]  Please follow-up in 3 months for hemoglobin A1c check.  Please make sure to arrive 15 minutes prior to your next appointment. If you arrive late, you may be asked to reschedule.    We look forward to seeing you next time. Please call our clinic at [phone] if you have any questions or concerns. The best time to call is Monday-Friday from 9am-4pm, but there is someone available 24/7. If after hours or the weekend, call the main hospital number and ask for the Internal Medicine Resident On-Call. If you need medication refills, please notify your pharmacy one week in advance and they will send [name] a request.   Thank you for letting us take part in your care. Wishing you the best!  [name], MD 12/27/2021, 11:08 AM IM Resident, PGY-1

## 2021-12-27 NOTE — Progress Notes (Signed)
Documentation for Freestyle Libre Pro Continuous glucose monitoring Asked/referred by Dr. Bruna Potter to place a professional Freestyle Libre Pro CGM sensor placed today. Patient was educated about wearing sensor, keeping food, activity and medication log and when to call office. Patient was educated about how to care for the sensor and not to have an MRI, CT or Diathermy while wearing the sensor.   Patient stated that she was unable to obtain a personal Continuous glucose monitor after her visit with me in October. I plan to assist her again in trying to obtain personal Continuous glucose monitor.   Follow up was arranged with the patient for 1 week. She will also be scheduled for a follow up in 2 weeks.   Lot #: [id number] Serial #: [id number] Expiration Date: 03/27/2022  Debera Lat, RD 12/27/2021 1:54 PM.

## 2021-12-27 NOTE — Addendum Note (Signed)
Addended by: [name] on: 12/27/2021 06:20 PM   Modules accepted: Orders

## 2021-12-27 NOTE — Assessment & Plan Note (Signed)
Patient's blood pressure today 107/88.  Orthostatic vitals performed given complaint of dizziness: Lying 100/69, sitting 88/66, standing 87/64.  Patient became symptomatic with standing reporting dizziness.  At last OV patient's lisinopril 5 mg was increased to 20 mg in combo medication.  Hydrochlorothiazide 25 mg was decreased to 12.5 mg in combo lisinopril-hydrochlorothiazide medication.  On assessment, patient soft blood pressures in clinic today with symptomatic orthostatic hypotension.  We will discontinue hydrochlorothiazide today.  Plan: -Discontinue lisinopril-hydrochlorothiazide 20-12.5 mg daily -Start lisinopril 20 mg daily

## 2021-12-27 NOTE — Assessment & Plan Note (Addendum)
Patient presents for dizziness.  Reports has remote history of BPPV which self resolved.  Had reoccurrence of the symptoms with COVID and was seen October 2022 at [facility].  At this time she was started on meclizine 25 mg 3 times daily.  At that appointment, she was also started on combo med lisinopril-hydrochlorothiazide 20-12.5mg  (was previously on lisinopril 5 mg and hydrochlorothiazide 25 mg).   Patient reports recently she has noticed dizziness upon standing, with cloudiness of her vision, bright light, occasionally nauseous, which will fluctuate though persist throughout the day.  Blood pressure today low 107/84.  On orthostatics BP lying down 100/89, sitting 88/66, standing 87/64.  Patient had no symptoms while lying or sitting.  She did become dizzy upon standing.  She reports she is taking the meclizine 3 times a day and notices dizziness in between doses.  She is not checking her blood pressures at home.  She reports the symptoms have worsened since her last appointment in October.  On assessment, suspect her dizziness is more likely related to her hypotension which may be related to the increased dose of antihypertensives, could also be due to autonomic dysfunction secondary to longstanding diabetes.  Would expect with BPPV that her dizziness would occur in brief episodes secondary to changes in head position which she denies.  We will discontinue hydrochlorothiazide today and continue only lisinopril 20 mg.  If patient has persistence of dizziness which fits a pattern of orthostatic hypotension, would recommend continuing to decrease antihypertensive regimen.  If patient has persistence of dizziness with appropriate blood pressures, will want to consider an MRI to rule out stroke in posterior circulation causing central vertigo, given she has several risk factors for stroke.  On exam patient did not have vertical nystagmus which is reassuring, and reports no other focal neurologic deficits.  If patient's  pattern of dizziness changes to fit BPPV pattern, could consider vestibular therapy which patient has never tried.  Plan: -Discontinue lisinopril-hydrochlorothiazide -Start lisinopril 20 mg daily -If patient has persistence of dizziness, consider reduction in lisinopril dosage, obtaining MRI brain, or vestibular therapy based on pattern of symptoms.

## 2021-12-27 NOTE — Patient Instructions (Signed)
Please record the time, amount and what food drinks and activities you have while wearing the continuous glucose monitor (CGM).  Bring the folder with you to follow up appointments. If your monitor falls off, please place it in the bag provided in your folder and bring it back with you to your next appointment.   Do not have a CT or an MRI while wearing the CGM.   1 week visit has been set up with me and a doctor for the first of two CGM downloads.   You will also return in 2 weeks to have your second download and the CGM removed.  Debera Lat, RD 12/27/2021 2:12 PM

## 2021-12-28 ENCOUNTER — Ambulatory Visit: Payer: Medicaid Other | Admitting: Obstetrics

## 2021-12-28 NOTE — Progress Notes (Signed)
Internal Medicine Clinic Attending ? ?Case discussed with Dr. Zinoviev  At the time of the visit.  We reviewed the resident?s history and exam and pertinent patient test results.  I agree with the assessment, diagnosis, and plan of care documented in the resident?s note.  ?

## 2022-01-12 ENCOUNTER — Encounter: Payer: Self-pay | Admitting: Dietician

## 2022-01-24 ENCOUNTER — Telehealth: Payer: Self-pay

## 2022-01-24 NOTE — Telephone Encounter (Signed)
PA for pt ( [employer]  came through on cover my meds .. was submitted with office notes and labs .. awaiting approval or denial

## 2022-01-24 NOTE — Telephone Encounter (Signed)
DECISION:     Approved today   PA Case: [id number],   Status: Approved, Coverage Starts on: 01/24/2022 12:00:00 AM, Coverage Ends on: 01/24/2023 12:00:00 AM.

## 2022-02-01 ENCOUNTER — Ambulatory Visit: Admission: RE | Admit: 2022-02-01 | Payer: Medicaid Other | Source: Ambulatory Visit

## 2022-02-03 ENCOUNTER — Telehealth: Payer: Medicaid Other | Admitting: Obstetrics

## 2022-02-07 ENCOUNTER — Ambulatory Visit
Admission: RE | Admit: 2022-02-07 | Discharge: 2022-02-07 | Disposition: A | Discharge status: Home / Self Care | Payer: Medicaid Other | Source: Ambulatory Visit | Attending: Obstetrics and Gynecology | Admitting: Obstetrics and Gynecology

## 2022-02-07 ENCOUNTER — Other Ambulatory Visit: Payer: Self-pay

## 2022-02-07 DIAGNOSIS — N950 Postmenopausal bleeding: Secondary | ICD-10-CM | POA: Diagnosis present

## 2022-02-10 ENCOUNTER — Encounter: Payer: Self-pay | Admitting: Obstetrics

## 2022-02-10 ENCOUNTER — Ambulatory Visit: Payer: Medicaid Other | Admitting: Obstetrics

## 2022-02-10 ENCOUNTER — Telehealth (INDEPENDENT_AMBULATORY_CARE_PROVIDER_SITE_OTHER): Payer: Medicaid Other | Admitting: Obstetrics

## 2022-02-10 DIAGNOSIS — R9389 Abnormal findings on diagnostic imaging of other specified body structures: Secondary | ICD-10-CM | POA: Diagnosis not present

## 2022-02-10 DIAGNOSIS — N950 Postmenopausal bleeding: Secondary | ICD-10-CM

## 2022-02-10 NOTE — Progress Notes (Signed)
S/w pt for virtual visit to discuss U/S results. ?

## 2022-02-10 NOTE — Progress Notes (Signed)
See note ? ?Shelly Bombard, MD ?02/10/2022 5:03 PM  ?

## 2022-02-10 NOTE — Progress Notes (Signed)
? ? ?GYNECOLOGY VIRTUAL VISIT ENCOUNTER NOTE ? ?Provider location: [facility] for [facility] at [place]  ? ?Patient location: Home ? ?I connected with [name] on 02/10/22 at  9:35 AM EDT by MyChart Video Encounter and verified that I am speaking with the correct person using two identifiers. ?  ?I discussed the limitations, risks, security and privacy concerns of performing an evaluation and management service virtually and the availability of in person appointments. I also discussed with the patient that there may be a patient responsible charge related to this service. The patient expressed understanding and agreed to proceed. ?  ?History:  ?[name] is a 61 y.o. [id number] female being evaluated today for ultrasound results.  She has a history of postmenopausal bleeding, and was seen in the office in December 2022.  Ultrasound was ordered and Megace was Rx.. She denies any abnormal vaginal discharge,  pelvic pain or other concerns.   ?  ?  ?Past Medical History:  ?Diagnosis Date  ? Anemia   ? Asthma 11/01/2010  ? BPPV (benign paroxysmal positional vertigo) 03/07/2013  ? Diabetes mellitus   ? since 1983  ? Elevated TSH 05/13/2013  ? GERD (gastroesophageal reflux disease)   ? Hypertension   ? Left elbow pain 05/05/2016  ? Left shoulder pain 05/05/2016  ? Osteomyelitis ([place])   ? Pneumonia   ? as a child  ? Wrist pain 04/11/2013  ? ?Past Surgical History:  ?Procedure Laterality Date  ? ACHILLES TENDON LENGTHENING Left 08/29/2014  ? Procedure: Left Achilles Lengthening;  Surgeon: Newt Minion, MD;  Location: [place];  Service: Orthopedics;  Laterality: Left;  ? AMPUTATION Left 02/19/2013  ? Procedure: Amputation of Left Great Toe;  Surgeon: Mcarthur Rossetti, MD;  Location: [place];  Service: Orthopedics;  Laterality: Left;  ? AMPUTATION Left 08/29/2014  ? Procedure: Left Foot 2nd and 1st Toe Amputation;  Surgeon: Newt Minion, MD;  Location: [place];  Service: Orthopedics;  Laterality: Left;  ? CESAREAN  SECTION    ? x 4  ? TUBAL LIGATION    ? ?The following portions of the patient's history were reviewed and updated as appropriate: allergies, current medications, past family history, past medical history, past social history, past surgical history and problem list.  ? ?Health Maintenance:  Normal pap and negative HRHPV on 11-17-2021.  Normal mammogram on 12-16-2021.  ? ?Review of Systems:  ?Pertinent items noted in HPI and remainder of comprehensive ROS otherwise negative. ? ?Physical Exam:  ? ?General:  Alert, oriented and cooperative. Patient appears to be in no acute distress.  ?Mental Status: Normal mood and affect. Normal behavior. Normal judgment and thought content.   ?Respiratory: Normal respiratory effort, no problems with respiration noted  ?Rest of physical exam deferred due to type of encounter ? ?Labs and Imaging ?No results found for this or any previous visit (from the past 336 hour(s)). ?US PELVIC COMPLETE WITH TRANSVAGINAL ? ?Result Date: 02/08/2022 ?CLINICAL DATA:  Postmenopausal bleeding for 5 months. EXAM: TRANSABDOMINAL AND TRANSVAGINAL ULTRASOUND OF PELVIS TECHNIQUE: Both transabdominal and transvaginal ultrasound examinations of the pelvis were performed. Transabdominal technique was performed for global imaging of the pelvis including uterus, ovaries, adnexal regions, and pelvic cul-de-sac. It was necessary to proceed with endovaginal exam following the transabdominal exam to visualize the uterus and endometrium. COMPARISON:  None FINDINGS: Uterus Measurements: 11.8 x 6.4 x 7.6 cm = volume: 303 mL. No fibroids or other mass visualized. Endometrium Thickness: 22.8 mm. The endometrium  is thickened and heterogeneous. There is a possible 2.9 cm endometrial mass with internal vascularity. Right ovary Measurements: 2.4 x 1.2 x 2.8 cm = volume: 4.2 mL. Normal appearance/no adnexal mass. Left ovary Not visualized on exam. Other findings No free fluid. IMPRESSION: Thickened heterogeneous endometrium  with masslike region with internal vascularity. Findings may be associated with endometrial carcinoma or hyperplasia. Biopsy is recommended for further evaluation. Electronically Signed   By: Brett Fairy M.D.   On: 02/08/2022 04:55     ?  ?Assessment and Plan:  ?   ?1. PMB (postmenopausal bleeding) ? ?2. Endometrial thickening on ultrasound ?- Megace Rx  ?- needs endometrial biopsy  ?   ?  ?I discussed the assessment and treatment plan with the patient. The patient was provided an opportunity to ask questions and all were answered. The patient agreed with the plan and demonstrated an understanding of the instructions. ?  ?The patient was advised to call back or seek an in-person evaluation/go to the ED if the symptoms worsen or if the condition fails to improve as anticipated. ? ?I have spent a total of 15 minutes of non-face to face, excluding clinical staff time, reviewing notes and preparing to see patient, ordering tests and/or medications, and counseling the patient.  ? ? ?Baltazar Najjar, MD ?[facility] [place], [place] ?02/10/22  ?

## 2022-02-22 ENCOUNTER — Ambulatory Visit (INDEPENDENT_AMBULATORY_CARE_PROVIDER_SITE_OTHER): Payer: Medicaid Other | Admitting: Obstetrics

## 2022-02-22 ENCOUNTER — Other Ambulatory Visit: Payer: Self-pay

## 2022-02-22 ENCOUNTER — Other Ambulatory Visit (HOSPITAL_COMMUNITY)
Admission: RE | Admit: 2022-02-22 | Discharge: 2022-02-22 | Disposition: A | Discharge status: Home / Self Care | Payer: Medicaid Other | Source: Ambulatory Visit | Attending: Obstetrics | Admitting: Obstetrics

## 2022-02-22 ENCOUNTER — Encounter: Payer: Self-pay | Admitting: Obstetrics

## 2022-02-22 VITALS — BP 158/88 | Ht 73.0 in | Wt 279.6 lb

## 2022-02-22 DIAGNOSIS — R9389 Abnormal findings on diagnostic imaging of other specified body structures: Secondary | ICD-10-CM

## 2022-02-22 DIAGNOSIS — N9489 Other specified conditions associated with female genital organs and menstrual cycle: Secondary | ICD-10-CM

## 2022-02-22 DIAGNOSIS — N950 Postmenopausal bleeding: Secondary | ICD-10-CM | POA: Diagnosis not present

## 2022-02-22 NOTE — Progress Notes (Signed)
Patient presents for follow up.  ?EMB done today, consent signed.  ?

## 2022-02-22 NOTE — Addendum Note (Signed)
Addended by: Baltazar Najjar A on: 02/22/2022 05:07 PM ? ? Modules accepted: Orders ? ?

## 2022-02-22 NOTE — Progress Notes (Signed)
Endometrial Biopsy Procedure Note ?                                           Postmenopausal Bleeding ( PMB ) ?Pre-operative Diagnosis: Thickened Endometrium on Ultrasound ?                                            Possible Endometrial Mass on Ultrasound ? ?Post-operative Diagnosis: same ? ?Indications: postmenopausal bleeding, thickened endometrium on ultrasound, possible endometrial polyp ? ?Procedure Details  ? Urine pregnancy test was not done.  The risks (including infection, bleeding, pain, and uterine perforation) and benefits of the procedure were explained to the patient and Written informed consent was obtained.   ? ?The patient was placed in the dorsal lithotomy position.  Bimanual exam showed the uterus to be in the neutral position.  A Graves' speculum inserted in the vagina, and the cervix prepped with povidone iodine.  Endocervical curettage with a Kevorkian curette was not performed. ? ? A sharp tenaculum was applied to the anterior lip of the cervix for stabilization.  A sterile uterine sound was used to sound the uterus to a depth of 9cm.  A Pipelle endometrial aspirator was used to sample the endometrium.  Sample was sent for pathologic examination. ? ?Condition: ?Stable ? ?Complications: ?None ? ?Plan: ? ?The patient was advised to call for any fever or for prolonged or severe pain or bleeding. She was advised to use OTC acetaminophen as needed for mild to moderate pain. She was advised to avoid vaginal intercourse for 48 hours or until the bleeding has completely stopped. ? ?Attending Physician Documentation: ?I was present for or participated in the entire procedure, including opening and closing.   ? ?Shelly Bombard, MD ?02/22/2022 2:50 PM  ?

## 2022-02-22 NOTE — Telephone Encounter (Signed)
Called patient to follow up on both personal cgm order at [facility]  and professional CGM that was placed in January. Dexcom G6 went through at copay zero dollars. Called and informed patient.  ?

## 2022-02-25 ENCOUNTER — Telehealth: Payer: Self-pay | Admitting: *Deleted

## 2022-02-25 LAB — SURGICAL PATHOLOGY

## 2022-02-25 NOTE — Telephone Encounter (Signed)
Spoke with the patient and scheduled a new patient appt with Dr Berline Lopes on 4/10 at 9:45 am; patient given an arrival time of 9:15 am. Patient given the address and phone number for the clinic.  ?

## 2022-02-28 ENCOUNTER — Other Ambulatory Visit: Payer: Self-pay | Admitting: Obstetrics & Gynecology

## 2022-02-28 ENCOUNTER — Other Ambulatory Visit: Payer: Self-pay | Admitting: Gynecologic Oncology

## 2022-02-28 ENCOUNTER — Other Ambulatory Visit: Payer: Medicaid Other

## 2022-02-28 ENCOUNTER — Telehealth: Payer: Self-pay | Admitting: Emergency Medicine

## 2022-02-28 ENCOUNTER — Telehealth: Payer: Self-pay | Admitting: *Deleted

## 2022-02-28 DIAGNOSIS — C55 Malignant neoplasm of uterus, part unspecified: Secondary | ICD-10-CM

## 2022-02-28 DIAGNOSIS — N950 Postmenopausal bleeding: Secondary | ICD-10-CM

## 2022-02-28 MED ORDER — TRAMADOL HCL 50 MG PO TABS: 50.0000 mg | ORAL_TABLET | Freq: Four times a day (QID) | ORAL | 0 refills | Status: DC | PRN

## 2022-02-28 NOTE — Telephone Encounter (Signed)
Spoke with the patient and scheduled a CT scan for 4/7 at 4pm. Patient given the date/time/instructions for the appt. Patient scheduled for a lab appt tomorrow and to pick up her contrast  ?

## 2022-02-28 NOTE — Progress Notes (Signed)
Per Dr. Berline Lopes, plan for CT CAP to assess for metastatic disease prior to new patient consultation. Bmet ordered to assess kidney function. ?

## 2022-02-28 NOTE — Telephone Encounter (Signed)
Spoke with pt this afternoon to let her know that per Joylene John, NP that to expedite her care Dr.Tucker recommends a CT scan for further evaluation. She would have to drink some oral contrast as well as receive IV contrast.  WE would need to check her kidney function prior to her having this and getting the contrast. Pt is ok with this plan. She has had a CT a long time ago. She is claustrophobic and will need to be pre medicated if her head will be covered or in a structure. Ensured pt that a CT scan is an open circle and her head will not be covered and she's in agreement with the plan. We will schedule all appointments and will call her back with the appointment times. Joylene John, NP made aware. Pt verbalized understanding and did not voice any other concerns at this time.  ?

## 2022-02-28 NOTE — Telephone Encounter (Signed)
Duplicate encounter

## 2022-02-28 NOTE — Telephone Encounter (Signed)
RC to patient who left voicemail on nurse triage line.  ?Pt reports lower abdominal and pelvic pain 7/10 that began Friday with her baseline vaginal bleeding. Denies fever, n/v. Notes minimal relief with Tylenol.  ?Per consult with Dr. Roselie Awkward, MD, prescription for Tramadol sent to pt's pharmacy. ?Pt verbalizes understanding for new Rx.  ?

## 2022-03-01 ENCOUNTER — Inpatient Hospital Stay: Payer: Medicaid Other | Attending: Gynecologic Oncology

## 2022-03-01 ENCOUNTER — Other Ambulatory Visit: Payer: Self-pay | Admitting: *Deleted

## 2022-03-01 ENCOUNTER — Other Ambulatory Visit: Payer: Self-pay | Admitting: Internal Medicine

## 2022-03-01 DIAGNOSIS — C55 Malignant neoplasm of uterus, part unspecified: Secondary | ICD-10-CM | POA: Diagnosis present

## 2022-03-01 DIAGNOSIS — J45909 Unspecified asthma, uncomplicated: Secondary | ICD-10-CM | POA: Diagnosis not present

## 2022-03-01 DIAGNOSIS — K5900 Constipation, unspecified: Secondary | ICD-10-CM | POA: Insufficient documentation

## 2022-03-01 DIAGNOSIS — I10 Essential (primary) hypertension: Secondary | ICD-10-CM | POA: Insufficient documentation

## 2022-03-01 DIAGNOSIS — K219 Gastro-esophageal reflux disease without esophagitis: Secondary | ICD-10-CM | POA: Diagnosis not present

## 2022-03-01 DIAGNOSIS — R590 Localized enlarged lymph nodes: Secondary | ICD-10-CM | POA: Diagnosis not present

## 2022-03-01 DIAGNOSIS — Z89429 Acquired absence of other toe(s), unspecified side: Secondary | ICD-10-CM | POA: Insufficient documentation

## 2022-03-01 DIAGNOSIS — E1165 Type 2 diabetes mellitus with hyperglycemia: Secondary | ICD-10-CM | POA: Diagnosis not present

## 2022-03-01 DIAGNOSIS — Z79818 Long term (current) use of other agents affecting estrogen receptors and estrogen levels: Secondary | ICD-10-CM | POA: Diagnosis not present

## 2022-03-01 DIAGNOSIS — Z7985 Long-term (current) use of injectable non-insulin antidiabetic drugs: Secondary | ICD-10-CM | POA: Diagnosis not present

## 2022-03-01 DIAGNOSIS — Z794 Long term (current) use of insulin: Secondary | ICD-10-CM

## 2022-03-01 DIAGNOSIS — Z7984 Long term (current) use of oral hypoglycemic drugs: Secondary | ICD-10-CM | POA: Diagnosis not present

## 2022-03-01 DIAGNOSIS — H8110 Benign paroxysmal vertigo, unspecified ear: Secondary | ICD-10-CM | POA: Insufficient documentation

## 2022-03-01 DIAGNOSIS — Z79899 Other long term (current) drug therapy: Secondary | ICD-10-CM | POA: Diagnosis not present

## 2022-03-01 DIAGNOSIS — E1142 Type 2 diabetes mellitus with diabetic polyneuropathy: Secondary | ICD-10-CM

## 2022-03-01 DIAGNOSIS — M869 Osteomyelitis, unspecified: Secondary | ICD-10-CM | POA: Diagnosis not present

## 2022-03-01 LAB — BASIC METABOLIC PANEL - CANCER CENTER ONLY
Anion gap: 8 (ref 5–15)
BUN: 19 mg/dL (ref 6–20)
CO2: 22 mmol/L (ref 22–32)
Calcium: 9.3 mg/dL (ref 8.9–10.3)
Chloride: 109 mmol/L (ref 98–111)
Creatinine: 0.95 mg/dL (ref 0.44–1.00)
GFR, Estimated: >60 mL/min (ref >60)
Glucose, Bld: 238 mg/dL — ABNORMAL HIGH (ref 70–99)
Potassium: 3.9 mmol/L (ref 3.5–5.1)
Sodium: 139 mmol/L (ref 135–145)

## 2022-03-01 MED ORDER — MECLIZINE HCL 25 MG PO TABS
25.0000 mg | ORAL_TABLET | Freq: Three times a day (TID) | ORAL | 1 refills | Status: DC | PRN
Start: 2022-03-01 — End: 2022-07-07

## 2022-03-01 NOTE — Telephone Encounter (Signed)
Patient was recently diagnosed with uterine cancer. She states that she has good support and will let us know if she needs anything. Refilled insulin medication and patient states she will call clinic to make appointment for Diabetes management and to see Ms. [name] next week. Please reach out to patient to help with scheduling follow-up next week. ? ?Will refill meclizine as well. ?

## 2022-03-02 ENCOUNTER — Telehealth: Payer: Self-pay | Admitting: Emergency Medicine

## 2022-03-02 ENCOUNTER — Telehealth: Payer: Self-pay

## 2022-03-02 ENCOUNTER — Other Ambulatory Visit: Payer: Self-pay

## 2022-03-02 DIAGNOSIS — E1142 Type 2 diabetes mellitus with diabetic polyneuropathy: Secondary | ICD-10-CM

## 2022-03-02 NOTE — Telephone Encounter (Signed)
Reviewed BMP results with patient. Per Joylene John, NP kidney function is on the higher end of normal but it is within normal range and she can proceed with the contrast for the CT scan. Blood glucose was elevated at 238. She needs to continue monitoring her glucose and follow up with her PCP for this. Patient verbalized understanding and states " I have been working with my internal medicine doctor on my blood sugar." Advised patient that we will send her the results to Dr. Howie Ill.  ?

## 2022-03-02 NOTE — Telephone Encounter (Signed)
RC to patient who left vm on nurse line. ?Patient reports pain 12/10 lower abdominal and pelvic pain.  ?Relief with pain level of 8 or 9/10 with medication.  ?Patients states pain makes her ball up an it feels like something is going to fall out of her vagina.  ?This RN advised pt to go to ED for pain management, per MD consult.  ?Pt agreed that she will try to manage at home today and will present to ED tomorrow if symptoms persist, worsen.  ?

## 2022-03-03 ENCOUNTER — Other Ambulatory Visit: Payer: Self-pay | Admitting: Emergency Medicine

## 2022-03-03 ENCOUNTER — Telehealth: Payer: Self-pay | Admitting: Emergency Medicine

## 2022-03-03 DIAGNOSIS — N950 Postmenopausal bleeding: Secondary | ICD-10-CM

## 2022-03-03 MED ORDER — TRAMADOL HCL 50 MG PO TABS: 50.0000 mg | ORAL_TABLET | Freq: Four times a day (QID) | ORAL | 0 refills | Status: DC | PRN

## 2022-03-03 NOTE — Telephone Encounter (Signed)
Pt called stating that she is out of pain medicine.  ?Per Dr. Jodi Mourning Rx refilled.  ?

## 2022-03-04 ENCOUNTER — Ambulatory Visit (HOSPITAL_COMMUNITY)
Admission: RE | Admit: 2022-03-04 | Discharge: 2022-03-04 | Disposition: A | Discharge status: Home / Self Care | Payer: Medicaid Other | Source: Ambulatory Visit | Attending: Gynecologic Oncology | Admitting: Gynecologic Oncology

## 2022-03-04 ENCOUNTER — Encounter: Payer: Self-pay | Admitting: Gynecologic Oncology

## 2022-03-04 DIAGNOSIS — C55 Malignant neoplasm of uterus, part unspecified: Secondary | ICD-10-CM | POA: Diagnosis present

## 2022-03-04 MED ORDER — IOHEXOL 300 MG/ML  SOLN
100.0000 mL | Freq: Once | INTRAMUSCULAR | Status: AC | PRN
  Administered 2022-03-04: 100 mL via INTRAVENOUS

## 2022-03-04 MED ORDER — SODIUM CHLORIDE (PF) 0.9 % IJ SOLN
INTRAMUSCULAR | Status: AC
  Filled 2022-03-04: qty 50

## 2022-03-04 MED ORDER — INSULIN LISPRO (1 UNIT DIAL) 100 UNIT/ML (KWIKPEN): PEN_INJECTOR | SUBCUTANEOUS | 0 refills | Status: DC

## 2022-03-07 ENCOUNTER — Other Ambulatory Visit: Payer: Self-pay | Admitting: Gynecologic Oncology

## 2022-03-07 ENCOUNTER — Telehealth: Payer: Medicaid Other | Admitting: Obstetrics and Gynecology

## 2022-03-07 ENCOUNTER — Telehealth: Payer: Self-pay | Admitting: Emergency Medicine

## 2022-03-07 ENCOUNTER — Inpatient Hospital Stay (HOSPITAL_BASED_OUTPATIENT_CLINIC_OR_DEPARTMENT_OTHER): Payer: Medicaid Other | Admitting: Gynecologic Oncology

## 2022-03-07 ENCOUNTER — Encounter: Payer: Self-pay | Admitting: Gynecologic Oncology

## 2022-03-07 ENCOUNTER — Other Ambulatory Visit: Payer: Self-pay

## 2022-03-07 ENCOUNTER — Other Ambulatory Visit: Payer: Medicaid Other

## 2022-03-07 ENCOUNTER — Other Ambulatory Visit: Payer: Self-pay | Admitting: Obstetrics

## 2022-03-07 VITALS — BP 159/73 | HR 98 | Temp 98.8°F | Resp 18 | Ht 73.0 in | Wt 269.0 lb

## 2022-03-07 DIAGNOSIS — E1165 Type 2 diabetes mellitus with hyperglycemia: Secondary | ICD-10-CM

## 2022-03-07 DIAGNOSIS — C541 Malignant neoplasm of endometrium: Secondary | ICD-10-CM

## 2022-03-07 DIAGNOSIS — C55 Malignant neoplasm of uterus, part unspecified: Secondary | ICD-10-CM | POA: Diagnosis not present

## 2022-03-07 DIAGNOSIS — E669 Obesity, unspecified: Secondary | ICD-10-CM

## 2022-03-07 DIAGNOSIS — N950 Postmenopausal bleeding: Secondary | ICD-10-CM

## 2022-03-07 MED ORDER — TRAMADOL HCL 50 MG PO TABS: 50.0000 mg | ORAL_TABLET | Freq: Four times a day (QID) | ORAL | 0 refills | Status: DC | PRN

## 2022-03-07 NOTE — Telephone Encounter (Signed)
Incoming call from [place] to office today to discuss necessity for additional imaging scheduled today. ?Per Dr. Jodi Mourning, MD, imaging scheduled for today can be canceled, as pt has already had U/S and CT scan to confirm diagnosis.  ? ?Telephone visit for 4/20 will be canceled as well per Dr. Jodi Mourning, as patient will have apt at cancer center today.  ?

## 2022-03-07 NOTE — H&P (View-Only) (Signed)
GYNECOLOGIC ONCOLOGY NEW PATIENT CONSULTATION  ? ?Patient Name: [name]  ?Patient Age: 61 y.o. ?Date of Service: 03/07/2022 ?Referring Provider:Dr. Baltazar Najjar  ? ?Primary Care Provider: Christiana Fuchs, DO ?Consulting Provider: Jeral Pinch, MD  ? ?Assessment/Plan:  ?Postmenopausal patient with uterine carcinosarcoma. ? ?Patient presented with her son and daughter today.  We discussed in detail her work-up thus far including biopsy showing uterine carcinosarcoma as well as her CT scan.  On recent imaging, while no definitive evidence of metastatic disease, there is one borderline enlarged pelvic lymph node.  This is not diagnostic for metastasis but raises the concern in the setting of high-grade cancer. ? ?We discussed the typical standard of care treatment for someone with a new diagnosis of uterine carcinosarcoma.  This includes surgical staging with total hysterectomy, bilateral salpingo oophorectomy, lymph node assessment, and tumor debulking.  I am concerned given the patient's uncontrolled type 2 diabetes that she is not a good candidate for surgery at this time.  She reports fasting glucose above 200.  Her last hemoglobin A1c was quite elevated.  We discussed the risk related to perioperative cardiac events as well as postoperative complications (such as poor healing and wound infection) related to persistent hyperglycemia.  Patient is scheduled to see her primary care provider and a nutritionist at the clinic this coming Wednesday.  I encouraged her to share with her PCP plan to pursue surgery if we can achieve better glycemic control.  Patient is quite motivated to work on improving her glucose control.  Has never seen an endocrinologist.  We discussed referral today which she was interested in.  I placed this and my nurse navigator will work on trying to get her scheduled with a practice in town. ? ?We discussed several options for treatment if we are unable to achieve better glycemic  control.  The first would be to plan admission preoperatively for an insulin drip.  While this will help achieve improved glycemic control in the immediate perioperative.,  I think we will still struggle with glycemic control postoperatively.  Another option would be to proceed with pelvic radiation and/or systemic therapy with the goal of ultimately proceeding with surgical resection if we can optimize the patient's diabetes.  If this is the treatment plan that we pursue, I recommend that we get a PET scan to help delineate whether it be borderline enlarged pelvic lymph node appears consistent with metastatic disease. ? ?If we are ultimately able to pursue surgery, we discussed that the plan would be for minimally invasive surgery with robotic assistance.  Surgery would include total hysterectomy, bilateral salpingo-oophorectomy, lymph node assessment (would start with sentinel lymph node biopsy with plan to remove any grossly involved or suspicious lymph nodes), and possible tumor debulking.  Given her high risk histology, we discussed that although we will wait for her final pathology, adjuvant therapy would be recommended. ? ?We reviewed the sentinel lymph node technique. Risks and benefits of sentinel lymph node biopsy was reviewed. We reviewed the technique and ICG dye. The patient DOES NOT have an iodine allergy or known liver dysfunction. We reviewed the false negative rate (0.4%), and that 3% of patients with metastatic disease will not have it detected by SLN biopsy in endometrial cancer. A low risk of allergic reaction to the dye, <0.2% for ICG, has been reported. We also discussed that in the case of failed mapping, which occurs 40% of the time, a bilateral or unilateral lymphadenectomy will be performed at the surgeon?s discretion.  ? ?  Potential benefits of sentinel nodes including a higher detection rate for metastasis due to ultrastaging and potential reduction in operative morbidity. However, there  remains uncertainty as to the role for treatment of micrometastatic disease. Further, the benefit of operative morbidity associated with the SLN technique in endometrial cancer is not yet completely known. In other patient populations (e.g. the cervical cancer population) there has been observed reductions in morbidity with SLN biopsy compared to pelvic lymphadenectomy. Lymphedema, nerve dysfunction and lymphocysts are all potential risks with the SLN technique as with complete lymphadenectomy. Additional risks to the patient include the risk of damage to an internal organ while operating in an altered view (e.g. the black and white image of the robotic fluorescence imaging mode).  ? ?Discussed tentative plan for a robotic assisted hysterectomy, bilateral salpingo-oophorectomy, sentinel lymph node evaluation, possible lymph node dissection, possible laparotomy. The risks of surgery were discussed in detail and she understands these to include infection; wound separation; hernia; vaginal cuff separation, injury to adjacent organs such as bowel, bladder, blood vessels, ureters and nerves; bleeding which may require blood transfusion; anesthesia risk; thromboembolic events; possible death; unforeseen complications; possible need for re-exploration; medical complications such as heart attack, stroke, pleural effusion and pneumonia; and, if full lymphadenectomy is performed the risk of lymphedema and lymphocyst. The patient will receive DVT and antibiotic prophylaxis as indicated. She voiced a clear understanding. She had the opportunity to ask questions. ? ?We will plan to have the patient return for perioperative counseling in 3-4 weeks at which time we can reevaluate her glycemic control and make ultimate plan for surgery. ? ?A copy of this note was sent to the patient's referring provider.  ? ?75 minutes of total time was spent for this patient encounter, including preparation, face-to-face counseling with the  patient and coordination of care, and documentation of the encounter. ? ? ?Jeral Pinch, MD  ?Division of Gynecologic Oncology  ?Department of Obstetrics and Gynecology  ?[facility]  ?___________________________________________  ?Chief Complaint: ?Chief Complaint  ?Patient presents with  ? 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[facility])  ? ? ?History of Present Illness:  ?[name] is a 61 y.o. y.o. female who is seen in consultation at the request of Dr. Jodi Mourning for an evaluation of uterine carcinosarcoma. ? ?Patient initially presented with postmenopausal bleeding.  This began in October 2022 as daily heavy bleeding.  Some days were heavier than others.  On average, the patient would use 5 overnight pads a day which were fully saturated when she changed them.  She was seen in December 2022.  Plan was made for ultrasound imaging and the patient was started on Megace.  With Megace, her bleeding decreased significantly.  She could go all day some days without any bleeding.  On other days, she would have light bleeding.  She continues to use overnight pads but they are often less than one third-half saturated. Given thickened endometrial lining on ultrasound, endometrial biopsy was recommended and this was performed on 3/28. ? ?Pelvic ultrasound exam on 3/13 showed uterus measuring 11.8 x 6.4 x 7.6 cm.  Endometrial lining measures 22.8 mm, is thickened and heterogenous.  Possible 2.9 cm endometrial mass with internal vascularity noted.  Right ovary normal in appearance, left ovary not seen. ?Endometrial biopsy on 02/22/2022 shows carcinosarcoma with predominance of sarcomatous component.  Both malignant epithelial and stromal component with high-grade morphology noted.  Heterologous elements are not identified.  There are foci of tumor necrosis and hemorrhage. ? ?Since her  biopsy, the patient had pelvic pain and menstrual-like cramping which has improved since over the weekend.  Prior to the  biopsy, she would have sporadic mild pain that resolved with the use of Tylenol.  She denies any passage of clots. ? ?She reports overall decreased appetite since being on Ozempic which she started last summer.  S

## 2022-03-07 NOTE — Patient Instructions (Addendum)
Plan to follow up with your primary care provider and we have placed a referral for your to meet with an endocrinologist about your diabetes.  ? ?We will tentatively schedule your surgery on Apr 27, 2022. We will see you back in the office in 4 weeks time to discuss the surgery in detail.  ? ?You may also receive a phone call from [facility] to schedule a preop appointment prior to the surgery date as well.  ? ?We will have you scheduled for robotic assisted total laparoscopic hysterectomy (removal of the uterus and cervix), bilateral salpingo-oophorectomy (removal of both ovaries and fallopian tubes), sentinel lymph node biopsy, possible lymph node dissection, possible laparotomy (larger incision on your abdomen if needed).  ?

## 2022-03-07 NOTE — Progress Notes (Signed)
GYNECOLOGIC ONCOLOGY NEW PATIENT CONSULTATION  ? ?Patient Name: [name]  ?Patient Age: 61 y.o. ?Date of Service: 03/07/2022 ?Referring Provider:Dr. Baltazar Najjar  ? ?Primary Care Provider: Christiana Fuchs, DO ?Consulting Provider: Jeral Pinch, MD  ? ?Assessment/Plan:  ?Postmenopausal patient with uterine carcinosarcoma. ? ?Patient presented with her son and daughter today.  We discussed in detail her work-up thus far including biopsy showing uterine carcinosarcoma as well as her CT scan.  On recent imaging, while no definitive evidence of metastatic disease, there is one borderline enlarged pelvic lymph node.  This is not diagnostic for metastasis but raises the concern in the setting of high-grade cancer. ? ?We discussed the typical standard of care treatment for someone with a new diagnosis of uterine carcinosarcoma.  This includes surgical staging with total hysterectomy, bilateral salpingo oophorectomy, lymph node assessment, and tumor debulking.  I am concerned given the patient's uncontrolled type 2 diabetes that she is not a good candidate for surgery at this time.  She reports fasting glucose above 200.  Her last hemoglobin A1c was quite elevated.  We discussed the risk related to perioperative cardiac events as well as postoperative complications (such as poor healing and wound infection) related to persistent hyperglycemia.  Patient is scheduled to see her primary care provider and a nutritionist at the clinic this coming Wednesday.  I encouraged her to share with her PCP plan to pursue surgery if we can achieve better glycemic control.  Patient is quite motivated to work on improving her glucose control.  Has never seen an endocrinologist.  We discussed referral today which she was interested in.  I placed this and my nurse navigator will work on trying to get her scheduled with a practice in town. ? ?We discussed several options for treatment if we are unable to achieve better glycemic  control.  The first would be to plan admission preoperatively for an insulin drip.  While this will help achieve improved glycemic control in the immediate perioperative.,  I think we will still struggle with glycemic control postoperatively.  Another option would be to proceed with pelvic radiation and/or systemic therapy with the goal of ultimately proceeding with surgical resection if we can optimize the patient's diabetes.  If this is the treatment plan that we pursue, I recommend that we get a PET scan to help delineate whether it be borderline enlarged pelvic lymph node appears consistent with metastatic disease. ? ?If we are ultimately able to pursue surgery, we discussed that the plan would be for minimally invasive surgery with robotic assistance.  Surgery would include total hysterectomy, bilateral salpingo-oophorectomy, lymph node assessment (would start with sentinel lymph node biopsy with plan to remove any grossly involved or suspicious lymph nodes), and possible tumor debulking.  Given her high risk histology, we discussed that although we will wait for her final pathology, adjuvant therapy would be recommended. ? ?We reviewed the sentinel lymph node technique. Risks and benefits of sentinel lymph node biopsy was reviewed. We reviewed the technique and ICG dye. The patient DOES NOT have an iodine allergy or known liver dysfunction. We reviewed the false negative rate (0.4%), and that 3% of patients with metastatic disease will not have it detected by SLN biopsy in endometrial cancer. A low risk of allergic reaction to the dye, <0.2% for ICG, has been reported. We also discussed that in the case of failed mapping, which occurs 40% of the time, a bilateral or unilateral lymphadenectomy will be performed at the surgeon?s discretion.  ? ?  Potential benefits of sentinel nodes including a higher detection rate for metastasis due to ultrastaging and potential reduction in operative morbidity. However, there  remains uncertainty as to the role for treatment of micrometastatic disease. Further, the benefit of operative morbidity associated with the SLN technique in endometrial cancer is not yet completely known. In other patient populations (e.g. the cervical cancer population) there has been observed reductions in morbidity with SLN biopsy compared to pelvic lymphadenectomy. Lymphedema, nerve dysfunction and lymphocysts are all potential risks with the SLN technique as with complete lymphadenectomy. Additional risks to the patient include the risk of damage to an internal organ while operating in an altered view (e.g. the black and white image of the robotic fluorescence imaging mode).  ? ?Discussed tentative plan for a robotic assisted hysterectomy, bilateral salpingo-oophorectomy, sentinel lymph node evaluation, possible lymph node dissection, possible laparotomy. The risks of surgery were discussed in detail and she understands these to include infection; wound separation; hernia; vaginal cuff separation, injury to adjacent organs such as bowel, bladder, blood vessels, ureters and nerves; bleeding which may require blood transfusion; anesthesia risk; thromboembolic events; possible death; unforeseen complications; possible need for re-exploration; medical complications such as heart attack, stroke, pleural effusion and pneumonia; and, if full lymphadenectomy is performed the risk of lymphedema and lymphocyst. The patient will receive DVT and antibiotic prophylaxis as indicated. She voiced a clear understanding. She had the opportunity to ask questions. ? ?We will plan to have the patient return for perioperative counseling in 3-4 weeks at which time we can reevaluate her glycemic control and make ultimate plan for surgery. ? ?A copy of this note was sent to the patient's referring provider.  ? ?75 minutes of total time was spent for this patient encounter, including preparation, face-to-face counseling with the  patient and coordination of care, and documentation of the encounter. ? ? ?Jeral Pinch, MD  ?Division of Gynecologic Oncology  ?Department of Obstetrics and Gynecology  ?[facility]  ?___________________________________________  ?Chief Complaint: ?Chief Complaint  ?Patient presents with  ? 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[facility])  ? ? ?History of Present Illness:  ?Sabrie A Gagan is a 61 y.o. y.o. female who is seen in consultation at the request of Dr. Jodi Mourning for an evaluation of uterine carcinosarcoma. ? ?Patient initially presented with postmenopausal bleeding.  This began in October 2022 as daily heavy bleeding.  Some days were heavier than others.  On average, the patient would use 5 overnight pads a day which were fully saturated when she changed them.  She was seen in December 2022.  Plan was made for ultrasound imaging and the patient was started on Megace.  With Megace, her bleeding decreased significantly.  She could go all day some days without any bleeding.  On other days, she would have light bleeding.  She continues to use overnight pads but they are often less than one third-half saturated. Given thickened endometrial lining on ultrasound, endometrial biopsy was recommended and this was performed on 3/28. ? ?Pelvic ultrasound exam on 3/13 showed uterus measuring 11.8 x 6.4 x 7.6 cm.  Endometrial lining measures 22.8 mm, is thickened and heterogenous.  Possible 2.9 cm endometrial mass with internal vascularity noted.  Right ovary normal in appearance, left ovary not seen. ?Endometrial biopsy on 02/22/2022 shows carcinosarcoma with predominance of sarcomatous component.  Both malignant epithelial and stromal component with high-grade morphology noted.  Heterologous elements are not identified.  There are foci of tumor necrosis and hemorrhage. ? ?Since her  biopsy, the patient had pelvic pain and menstrual-like cramping which has improved since over the weekend.  Prior to the  biopsy, she would have sporadic mild pain that resolved with the use of Tylenol.  She denies any passage of clots. ? ?She reports overall decreased appetite since being on Ozempic which she started last summer.  S

## 2022-03-08 ENCOUNTER — Telehealth: Payer: Self-pay | Admitting: *Deleted

## 2022-03-08 ENCOUNTER — Telehealth: Payer: Self-pay | Admitting: Oncology

## 2022-03-08 DIAGNOSIS — E669 Obesity, unspecified: Secondary | ICD-10-CM | POA: Insufficient documentation

## 2022-03-08 DIAGNOSIS — C541 Malignant neoplasm of endometrium: Secondary | ICD-10-CM | POA: Insufficient documentation

## 2022-03-08 NOTE — Telephone Encounter (Signed)
[facility] called back and they are not accepting new diabetic patients at this time. ?

## 2022-03-08 NOTE — Telephone Encounter (Signed)
Returned the patient's call; explained that the office was working on her referral for an endocrinologist and once an appt is made the office will call her with the information   ?

## 2022-03-08 NOTE — Telephone Encounter (Addendum)
Left a message for the referral coordinator at [facility] to schedule patient with Dr. Chalmers Cater with Endocrinology. ?

## 2022-03-08 NOTE — Telephone Encounter (Signed)
Left a message for [name] with referral.  Requested a return call. ?

## 2022-03-08 NOTE — Telephone Encounter (Signed)
Per Dr Berline Lopes fax office note and surgical optimization form to patient's PCP office  ?

## 2022-03-09 ENCOUNTER — Encounter: Payer: Medicaid Other | Admitting: Dietician

## 2022-03-09 NOTE — Telephone Encounter (Signed)
[facility] and scheduled patient with Dr. Grandville Silos on 07/05/22 at 9:20.  Patient has been placed on the high priority cancellation list and also urgent request was faxed to [phone]. ?

## 2022-03-09 NOTE — Telephone Encounter (Signed)
Faxed urgent referral to [facility] at [phone]. ?

## 2022-03-09 NOTE — Telephone Encounter (Signed)
[facility] called back and do not accept patient's insurance. ?

## 2022-03-09 NOTE — Telephone Encounter (Signed)
Called [facility] and notified her of appointment on 07/05/22 and that she is on the high priority waiting list.  She verbalized understanding and agreement. ?

## 2022-03-14 ENCOUNTER — Telehealth: Payer: Self-pay

## 2022-03-14 NOTE — Telephone Encounter (Signed)
Pa for pt  ( DEXCOM G6 SENSOR) came through on cover my meds was submitted with office notes and last labs ... awaiting approval or denial . ? ? ? ? ? ? UPDATE : ? ? ? ?[facility] has not replied to your PA request. Turnaround time for review of a PA request is dependent upon insurance plan and can range from 24 hours to 5 calendar days. ?

## 2022-03-15 ENCOUNTER — Telehealth: Payer: Self-pay | Admitting: Internal Medicine

## 2022-03-15 NOTE — Progress Notes (Signed)
[facility]: Clinic Note ? ?Subjective: ? ?History of Present Illness: [name] is a 61 y.o. year old female who presents for an acute care visit to discuss her diabetes. I set this appointment up after our phone conversation yesterday (see note). ? ?Briefly, patient has a longstanding history of poorly controlled T2DM. Recently, she was diagnosed with uterine carcinosarcoma, and her physician (Dr. Jeral Pinch at [facility]) is recommending surgical staging with total hysterectomy, bilateral salpingo oophorectomy, lymph node assessment, and tumor debulking. Dr. Berline Lopes expressed concerns that patient is not medically optimized for the procedure due to her uncontrolled T2DM with fasting sugars in 200s and 11/2021 A1C of 10.2 ? ?Today's visit is to get better control of her sugars.  ? ?POC A1C today: 7.8 ?POC glucose: 72 ? ?She brought her DexCom today and [name] will place it.  ? ?Current regimen: ?- Lantus 80 units nightly ?- Lispro 20-22 units TID with meals ?- Metformin 2000 units daily ?- Ozempic 1.31m weekly (plan to increase to 2.0 mg weekly today) ? ?Sugars (Is currently checking sugars with regular glucometer) ?- Fasting: 200s ?- Highs: 340s post-prandial ?- She has had some occasional lows (52, symptomatic) when she takes her insulin but doesn't eat a regular meal after, and these improve with food.  ? ? ?Please refer to Assessment and Plan below for full details in Problem-Based Charting.  ? ?Past Medical History:  ?Patient Active Problem List  ? Diagnosis Date Noted  ? 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([place] 03/08/2022  ? Obesity, Class II, BMI 35-39.9 03/08/2022  ? Visit for routine gyn exam 11/17/2021  ? PMB (postmenopausal bleeding) 11/17/2021  ? Dizziness 09/22/2021  ? 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  ? Gangrene of toe of right foot ([place] 04/14/2021  ? Thoracic outlet syndrome 07/12/2020  ? Shoulder pain 05/29/2020  ? Neck pain 05/06/2020  ? Colon cancer screening 05/05/2020  ?  Encounter for screening mammogram for malignant neoplasm of breast 05/05/2020  ? Chest pain 02/24/2020  ? Low back pain 01/20/2020  ? Lumbar radiculopathy 10/28/2019  ? Strain of lumbar paraspinal muscle, initial encounter 09/10/2019  ? Hand pain, right 03/14/2019  ? Uncontrolled diabetes mellitus with hyperglycemia ([place] 07/26/2018  ? Long term current use of opiate analgesic 01/31/2017  ? Opioid dependence ([place] 11/27/2016  ? Morton neuroma 11/02/2016  ? Broken teeth 11/02/2016  ? Diabetic ulcer of toe of left foot associated with diabetes mellitus due to underlying condition ([place] 11/02/2016  ? Hyperlipidemia associated with type 2 diabetes mellitus ([place] 04/22/2016  ? Severe obesity (BMI 35.0-39.9) 01/22/2016  ? GERD (gastroesophageal reflux disease) 03/03/2014  ? Health care maintenance 01/06/2014  ? 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 ASTHMA 11/01/2010  ? Hypertensive disorder 11/28/2008  ? Diabetes mellitus ([place] 11/28/1988  ?  ?Social History: Reviewed in [place] Pertinent updates today include: None ? ?Family History: Reviewed in Epic. Pertinent updates today include: None ? ?Medications:  ?Current Outpatient Medications:  ?  Semaglutide, 2 MG/DOSE, 8 MG/3ML SOPN, Inject 2 mg into the skin once a week., Disp: 10 mL, Rfl: 3 ?  ACCU-CHEK GUIDE test strip, USE UP TO FOUR TIMES DAILY AS DIRECTED, Disp: 100 each, Rfl: 3 ?  atorvastatin (LIPITOR) 80 MG tablet, Take 1 tablet (80 mg total) by mouth daily., Disp: 90 tablet, Rfl: 3 ?  blood glucose meter kit and supplies KIT, Dispense based on patient and insurance preference. Use up to four times daily as directed. (FOR ICD-9 250.00, 250.01)., Disp: 1 each,  Rfl: 0 ?  blood glucose meter kit and supplies, Dispense based on patient and insurance preference. Use up to four times daily as directed. (FOR ICD-10 E10.9, E11.9)., Disp: 1 each, Rfl: 0 ?  Continuous Blood Gluc Receiver (DEXCOM G6 RECEIVER) DEVI, Check blood sugar at least 8 times a day, Disp: 1 each,  Rfl: 0 ?  Continuous Blood Gluc Sensor (DEXCOM G6 SENSOR) MISC, Check blood sugar at least 8 times a day, Disp: 9 each, Rfl: 3 ?  Continuous Blood Gluc Transmit (DEXCOM G6 TRANSMITTER) MISC, Check blood sugar at least 8 times a day, Disp: 1 each, Rfl: 3 ?  diclofenac Sodium (VOLTAREN) 1 % GEL, Apply 4 g topically 4 (four) times daily., Disp: 350 g, Rfl: 1 ?  gabapentin (NEURONTIN) 300 MG capsule, Take 3 capsules (900 mg total) by mouth 3 (three) times daily., Disp: 270 capsule, Rfl: 6 ?  insulin glargine (LANTUS SOLOSTAR) 100 UNIT/ML Solostar Pen, Inject 80 Units into the skin at bedtime., Disp: 24 mL, Rfl: 2 ?  insulin lispro (HUMALOG) 100 UNIT/ML KwikPen, INJECT 20-22 UNITS SUBCUTANEOUSLY THREE TIMES DAILY WITH MEALS, Disp: 15 mL, Rfl: 0 ?  Insulin Pen Needle 31G X 5 MM MISC, Use as directed with Lantus and Humalog, Disp: 100 each, Rfl: PRN ?  Lancets (ONETOUCH DELICA PLUS XTKWIO97D) MISC, Check blood sugar 3 times a day, Disp: 300 each, Rfl: 3 ?  lisinopril (ZESTRIL) 20 MG tablet, Take 1 tablet (20 mg total) by mouth daily., Disp: 30 tablet, Rfl: 11 ?  meclizine (ANTIVERT) 25 MG tablet, Take 1 tablet (25 mg total) by mouth 3 (three) times daily as needed for dizziness., Disp: 60 tablet, Rfl: 1 ?  megestrol (MEGACE) 40 MG tablet, Take 1 tablet (40 mg total) by mouth 2 (two) times daily. Can increase to two tablets twice a day in the event of heavy bleeding, Disp: 60 tablet, Rfl: 5 ?  metFORMIN (GLUCOPHAGE-XR) 500 MG 24 hr tablet, Take 2 tablets (1,000 mg total) by mouth 2 (two) times daily with a meal., Disp: 120 tablet, Rfl: 3 ?  Multiple Vitamin (MULTIVITAMIN WITH MINERALS) TABS tablet, Take 1 tablet by mouth every evening. , Disp: , Rfl:  ?  omeprazole (PRILOSEC) 20 MG capsule, Take 1 capsule (20 mg total) by mouth daily., Disp: 30 capsule, Rfl: 1 ?  traMADol (ULTRAM) 50 MG tablet, Take 1-2 tablets (50-100 mg total) by mouth every 6 (six) hours as needed for severe pain or moderate pain., Disp: 20 tablet, Rfl:  0  ? ?Allergies: ?Allergies  ?Allergen Reactions  ? Aspirin Nausea And Vomiting  ? Ibuprofen Nausea And Vomiting and Other (See Comments)  ?  Abdominal pain  ? Penicillins Other (See Comments)  ?  Unknown ?Did it involve swelling of the face/tongue/throat, SOB, or low BP? Unk ?Did it involve sudden or severe rash/hives, skin peeling, or any reaction on the inside of your mouth or nose? Unk  ?Did you need to seek medical attention at a hospital or doctor's office? Unk ?When did it last happen? Childhood      ?If all above answers are "NO", may proceed with cephalosporin use. ?  ?  ?Review of Systems: ?Review of Systems  ?Gastrointestinal:  Negative for abdominal pain, diarrhea, nausea and vomiting.  ?Psychiatric/Behavioral:  The patient is nervous/anxious.    ? ?Objective:  ? ?Vitals: ?Vitals:  ? 03/16/22 1104  ?Temp: 98.7 ?F (37.1 ?C)  ?SpO2: 100%  ?  ?Physical Exam: ?Physical Exam ?Constitutional:   ?  Appearance: Normal appearance. She is obese.  ?Pulmonary:  ?   Effort: Pulmonary effort is normal. No respiratory distress.  ?Neurological:  ?   Mental Status: She is alert.  ?  ? ?Data: Labs, imaging, and micro were reviewed in Epic. Refer to Assessment and Plan below for full details in Problem-Based Charting. ? ?Assessment & Plan: ? ?Diabetes mellitus ([place]) ?- Patient needs to have T2DM under better control before her upcoming GYN/ONC surgery for uterine carcinosarcoma on 5/31 ?- POC A1C 7.8 today and glucose 72 ?- Continue Lantus 80 units nightly, Lispro 20-22 units TID with meals, metformin 2033m daily ?- Increase Ozempic from 1 to 2 mg weekly starting today ?- DButch Pennyplaced DexCom today for CGM ?- Follow up in 1 week with DButch Pennyand with me to review the monitor and make adjustments to insulin regimen ?- I will update her Gyn/Onc surgeon with this plan and her PCP ? ?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([place] ?- Surgery goal is 5/31 with Gyn/Onc  ?  ? ? ?Patient will follow up in 1 week with me and DButch Penny? ?JLottie Mussel MD   ? ? ?

## 2022-03-15 NOTE — Telephone Encounter (Signed)
I called patient to discuss her concerns about hyperglycemia. ?Briefly, she has uterine carcinosarcoma, and needs surgery with GYN/ONC for this. GYN/ONC requires that her sugars be better controlled. ? ?Last A1C was 10.2 in January 2023. ?Currently, she's taking: ?- Lantus 80 units nightly ?- Lispro 20-22 units TID with meals ?- Metformin 2000 units daily ?- Ozempic 1.'0mg'$  weekly ? ?[name] called patient and her DexCom is ready for pickup & placement. ? ?Fasting sugars have been 200s ?Highs have been 340s post prandial ?Rare lows when she takes insulin and doesn't eat after, low was 52 this morning, improved with food ? ?I'll see patient in person tomorrow ?Will hopefully get [name] to see too at this time and place Dexcom ?Endo referral is in, but next available was August. I'll see if we can expedite, but I explained that I think we can achieve improvement in PCP clinic ? ?Patient would prefer to see attendings only and will be seen in my clinic tomorrow  ?

## 2022-03-15 NOTE — Telephone Encounter (Signed)
Called the patient to sch  a Surgical Clearance Appt per Dr Howie Ill for her upcoming Oncology Gyn Surgery per the form that were faxed. ? ?The pt became very upset stating, "Why should I have to come back to your office for a clearance". Ya'll did not do nothing for me the last time when ya'll sent me out of there with high blood Sugars.  What are you gonna do for me now.  That doctor I saw last time did not do nothing for me!!"  The pt became louder and louder.  The patient was asked to hold on for a minute and a traige would come to the phone to speak with her about her Surgical Clearance. ? ? ?Transferred the call to Gladys(triage. ?

## 2022-03-16 ENCOUNTER — Encounter: Payer: Self-pay | Admitting: Internal Medicine

## 2022-03-16 ENCOUNTER — Ambulatory Visit (INDEPENDENT_AMBULATORY_CARE_PROVIDER_SITE_OTHER): Payer: Medicaid Other | Admitting: Dietician

## 2022-03-16 ENCOUNTER — Encounter: Payer: Self-pay | Admitting: Dietician

## 2022-03-16 ENCOUNTER — Ambulatory Visit (INDEPENDENT_AMBULATORY_CARE_PROVIDER_SITE_OTHER): Payer: Medicaid Other | Admitting: Internal Medicine

## 2022-03-16 VITALS — BP 130/57 | HR 91 | Temp 98.7°F | Ht 73.0 in | Wt 281.7 lb

## 2022-03-16 DIAGNOSIS — Z794 Long term (current) use of insulin: Secondary | ICD-10-CM

## 2022-03-16 DIAGNOSIS — E119 Type 2 diabetes mellitus without complications: Secondary | ICD-10-CM

## 2022-03-16 DIAGNOSIS — E1165 Type 2 diabetes mellitus with hyperglycemia: Secondary | ICD-10-CM | POA: Diagnosis present

## 2022-03-16 DIAGNOSIS — C541 Malignant neoplasm of endometrium: Secondary | ICD-10-CM

## 2022-03-16 DIAGNOSIS — E1142 Type 2 diabetes mellitus with diabetic polyneuropathy: Secondary | ICD-10-CM | POA: Diagnosis not present

## 2022-03-16 LAB — POCT GLYCOSYLATED HEMOGLOBIN (HGB A1C): Hemoglobin A1C: 7.8 % — AB (ref 4.0–5.6)

## 2022-03-16 LAB — GLUCOSE, CAPILLARY: Glucose-Capillary: 72 mg/dL (ref 70–99)

## 2022-03-16 MED ORDER — INSULIN LISPRO (1 UNIT DIAL) 100 UNIT/ML (KWIKPEN): PEN_INJECTOR | SUBCUTANEOUS | 0 refills | Status: DC

## 2022-03-16 MED ORDER — LANTUS SOLOSTAR 100 UNIT/ML ~~LOC~~ SOPN: 80.0000 [IU] | PEN_INJECTOR | Freq: Every day | SUBCUTANEOUS | 2 refills | Status: DC

## 2022-03-16 MED ORDER — SEMAGLUTIDE (2 MG/DOSE) 8 MG/3ML ~~LOC~~ SOPN: 2.0000 mg | PEN_INJECTOR | SUBCUTANEOUS | 3 refills | Status: DC

## 2022-03-16 NOTE — Progress Notes (Signed)
Diabetes Self-Management Education ? ?Visit Type: Follow-up (annual fu after incomplete initial cisit in 08-2021) ? ?Appt. Start Time: 1045 Appt. End Time: 1230 ? ?03/16/2022 ? ?[name], identified by name and date of birth, is a 61 y.o. female with a diagnosis of Diabetes:  .  ? ?ASSESSMENT ? ?Dexcom G6 Personal CGM Training ? ?[name] was educated about the following: ? ?-Getting to know device    Software engineer ) ?-Setting up device (high alert  200  , low alert 75  ) ?-Rise alert set at   not set mg/dL  ?-Fall alert set at    not set  mg/dL  ?-Setting alert profile ?-Inserting sensor ( patient did this with minimal assist on middle of abdomen- WNL) ?-Calibrating- none required for G6 ?-Ending sensor session ?-Trouble shooting ?-Tape/patch guide, clarity information was not provided today ?-Reviewed insulin dosing from dexcom.  ? ?Patient has [facility] tech support and my contact information and will follow up in 1 week ? ?[name], RD ?03/16/2022 ?4:30 PM. ? ? ? Diabetes Self-Management Education - 03/16/22 1600   ? ?  ? Visit Information  ? Visit Type Follow-up   annual fu after incomplete initial cisit in 08-2021  ?  ? Health Coping  ? How would you rate your overall health? Fair   ?  ? Psychosocial Assessment  ? Patient Belief/Attitude about Diabetes Motivated to manage diabetes   has to lower A1C for surgery  ? Self-care barriers Lack of material resources   ? Self-management support Doctor's office;Family;CDE visits   ? Patient Concerns Monitoring   ? Special Needs None   ? Preferred Learning Style Hands on   ? Learning Readiness Ready   ? How often do you need to have someone help you when you read instructions, pamphlets, or other written materials from your doctor or pharmacy? 3 - Sometimes   ? What is the last grade level you completed in school? 12   ?  ? Pre-Education Assessment  ? Patient understands the diabetes disease and treatment process. Demonstrates understanding /  competency   ? Patient understands incorporating nutritional management into lifestyle. Demonstrates understanding / competency   ? Patient undertands incorporating physical activity into lifestyle. Demonstrates understanding / competency   ? Patient understands using medications safely. Demonstrates understanding / competency   ? Patient understands monitoring blood glucose, interpreting and using results Needs Instruction   on CGM  ? Patient understands prevention, detection, and treatment of acute complications. Demonstrates understanding / competency   ? Patient understands prevention, detection, and treatment of chronic complications. Demonstrates understanding / competency   ? Patient understands how to develop strategies to address psychosocial issues. Demonstrates understanding / competency   ? Patient understands how to develop strategies to promote health/change behavior. Demonstrates understanding / competency   ?  ? Complications  ? Last HgB A1C per patient/outside source 7.8 %   ? How often do you check your blood sugar? 3-4 times / week   ? Fasting Blood glucose range (mg/dL) 70-129;130-179   ? Postprandial Blood glucose range (mg/dL) 70-129;130-179;180-200;>200   ? Number of hypoglycemic episodes per month 1   ? Can you tell when your blood sugar is low? No   ? Number of hyperglycemic episodes per week 3   ? Can you tell when your blood sugar is high? No   ? Have you had a dilated eye exam in the past 12 months? Yes   ? Have you had  a dental exam in the past 12 months? No   ? Are you checking your feet? Yes   ? How many days per week are you checking your feet? 7   ?  ? Exercise  ? Exercise Type ADL's   ?  ? Patient Education  ? Previous Diabetes Education Yes (please comment)   here 2014, 2017, 2019, 2022  ? Monitoring Other (comment)   see dexcom personal training  ?  ? Individualized Goals (developed by patient)  ? Monitoring  test my blood glucose as discussed;Other (comment)   come to follow up  next week and change sensor every 10 days  ?  ? Post-Education Assessment  ? Patient understands monitoring blood glucose, interpreting and using results Demonstrates understanding / competency   ?  ? Outcomes  ? Expected Outcomes Demonstrated interest in learning. Expect positive outcomes   ? Future DMSE Other (comment)   1 week  ? Program Status Not Completed   ?  ? Subsequent Visit  ? Since your last visit have you continued or begun to take your medications as prescribed? Yes   ? Since your last visit have you had your blood pressure checked? Yes   ? Is your most recent blood pressure lower, unchanged, or higher since your last visit? Lower   ? Since your last visit have you experienced any weight changes? Loss   ? Weight Loss (lbs) 17   ? Since your last visit, are you checking your blood glucose at least once a day? Yes   ? ?  ?  ? ?  ? ? ?Individualized Plan for Diabetes Self-Management Training:  ? ?Learning Objective:  Patient will have a greater understanding of diabetes self-management. ?Patient education plan is to attend individual and/or group sessions per assessed needs and concerns. ?  ?Plan:  ? ?There are no Patient Instructions on file for this visit. ? ?Expected Outcomes:  Demonstrated interest in learning. Expect positive outcomes ? ?Education material provided: Diabetes Resources ? ?If problems or questions, patient to contact team via:  Phone ? ?Future DSME appointment: Other (comment) (1 week) ?[name], RD ?03/16/2022 ?4:32 PM. ? ?

## 2022-03-16 NOTE — Telephone Encounter (Signed)
DECISION : ? ? ? DENIED  ? ? ( But it does not have a reason to why so I will run it again )  ?

## 2022-03-16 NOTE — Patient Instructions (Signed)
-   Your A1C is 7.8, which is improved from 10.2 a few months ago ?- Continue to take your insulin and metformin as you have been. I refilled your insulin and sent this to your pharmacy ?- Increase your ozempic from '1mg'$  to '2mg'$  weekly. If you have problems with nausea, vomiting, or diarrhea, go back down to '1mg'$  weekly ? ?[name] and I will see you back in 1 week to review your sugars and make adjustments to your insulin regimen. ? ?Our goal is to get your diabetes under control before your upcoming surgery. Please contact us with any questions or concerns ?

## 2022-03-16 NOTE — Assessment & Plan Note (Signed)
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

## 2022-03-16 NOTE — Assessment & Plan Note (Signed)
-   Surgery goal is 5/31 with Gyn/Onc  ?

## 2022-03-17 ENCOUNTER — Telehealth: Payer: Self-pay | Admitting: *Deleted

## 2022-03-17 ENCOUNTER — Telehealth: Payer: Medicaid Other | Admitting: Obstetrics and Gynecology

## 2022-03-17 NOTE — Telephone Encounter (Signed)
Spoke with pt this morning who stated that she had her HgbA1c drawn yesterday and it's 7.8. She was wondering if her surgery could possibly be moved up since this was the reason for the delay in surgery. Informed her that we would let the providers know and reach back out to her. She verbalized understanding.  ?

## 2022-03-22 ENCOUNTER — Telehealth: Payer: Self-pay | Admitting: *Deleted

## 2022-03-22 ENCOUNTER — Other Ambulatory Visit: Payer: Self-pay

## 2022-03-22 DIAGNOSIS — E1142 Type 2 diabetes mellitus with diabetic polyneuropathy: Secondary | ICD-10-CM

## 2022-03-22 MED ORDER — LANTUS SOLOSTAR 100 UNIT/ML ~~LOC~~ SOPN: 80.0000 [IU] | PEN_INJECTOR | Freq: Every day | SUBCUTANEOUS | 2 refills | Status: DC

## 2022-03-22 NOTE — Telephone Encounter (Signed)
Spoke with pt this afternoon to inform her that her PCP did not optimize her for surgery until 04/27/22 due to her needing better blood sugar control. Pt stated that the provider dropped the ball on controlling her blood sugar and she has an appointment with the provider tomorrow and will speak with them about this problem. Informed pt if we have an earlier opening for surgery we will let her know and consult with her PCP as well. Pt verbalized understanding.  ?

## 2022-03-22 NOTE — Telephone Encounter (Signed)
Lantus Solostar was Refilled on 4/19 but as "Print". Please re-send electronically.  Thanks ?

## 2022-03-22 NOTE — Progress Notes (Signed)
[facility]: Clinic Note ? ?Subjective: ? ?History of Present Illness: [name] is a 61 y.o. year old female who presents for 1 week follow-up for her diabetes. ? ?Seeing [name] at 9:45, then me ? ?Briefly, patient has a longstanding history of poorly controlled T2DM. Recently, she was diagnosed with uterine carcinosarcoma, and her physician (Dr. Jeral Pinch at [facility]) is recommending surgical staging with total hysterectomy, bilateral salpingo oophorectomy, lymph node assessment, and tumor debulking. Dr. Berline Lopes expressed concerns that patient is not medically optimized for the procedure due to her uncontrolled T2DM with fasting sugars in 200s and 11/2021 A1C of 10.2. POC A1C 7.8 on 4/19. Dexcom placed last time. ? ?Current regimen:  ?- Lantus 80 units nightly ?- Lispro 20-22 units TID with meals ?- Metformin 2000 units daily ?- Ozempic 1.0 weekly  ? ?[name] saw her first, and then me, and we all reviewed CGM. Numbers have been great! 71% in range, only 2% very high and <1% very low. Average glucose 146. Patient feels well. No chest pain. Able to ambulate and perform moderate housework without dyspnea on exertion.  ? ?Blood pressure 127/59 today.  ? ? ?Please refer to Assessment and Plan below for full details in Problem-Based Charting.  ? ?Past Medical History:  ?Patient Active Problem List  ? Diagnosis Date Noted  ? 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 Obesity, Class II, BMI 35-39.9 03/08/2022  ? Visit for routine gyn exam 11/17/2021  ? PMB (postmenopausal bleeding) 11/17/2021  ? Dizziness 09/22/2021  ? 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  ? Gangrene of toe of right foot ([place]) 04/14/2021  ? Thoracic outlet syndrome 07/12/2020  ? Shoulder pain 05/29/2020  ? Neck pain 05/06/2020  ? Colon cancer screening 05/05/2020  ? Encounter for screening mammogram for malignant neoplasm of breast 05/05/2020  ? Chest pain 02/24/2020  ? Low back pain 01/20/2020  ? Lumbar radiculopathy 10/28/2019  ?  Strain of lumbar paraspinal muscle, initial encounter 09/10/2019  ? Hand pain, right 03/14/2019  ? Uncontrolled diabetes mellitus with hyperglycemia ([place]) 07/26/2018  ? Long term current use of opiate analgesic 01/31/2017  ? Opioid dependence ([place]) 11/27/2016  ? Morton neuroma 11/02/2016  ? Broken teeth 11/02/2016  ? Diabetic ulcer of toe of left foot associated with diabetes mellitus due to underlying condition ([place]) 11/02/2016  ? Hyperlipidemia associated with type 2 diabetes mellitus ([place]) 04/22/2016  ? Severe obesity (BMI 35.0-39.9) 01/22/2016  ? GERD (gastroesophageal reflux disease) 03/03/2014  ? Health care maintenance 01/06/2014  ? 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 ASTHMA 11/01/2010  ? Hypertensive disorder 11/28/2008  ? Diabetes mellitus ([place]) 11/28/1988  ?  ?Social History: Reviewed in [place]. Pertinent updates today include: none ? ?Family History: Reviewed in Epic. Pertinent updates today include: None ? ?Medications:  ?Current Outpatient Medications:  ?  ACCU-CHEK GUIDE test strip, USE UP TO FOUR TIMES DAILY AS DIRECTED, Disp: 100 each, Rfl: 3 ?  atorvastatin (LIPITOR) 80 MG tablet, Take 1 tablet (80 mg total) by mouth daily., Disp: 90 tablet, Rfl: 3 ?  blood glucose meter kit and supplies KIT, Dispense based on patient and insurance preference. Use up to four times daily as directed. (FOR ICD-9 250.00, 250.01)., Disp: 1 each, Rfl: 0 ?  blood glucose meter kit and supplies, Dispense based on patient and insurance preference. Use up to four times daily as directed. (FOR ICD-10 E10.9, E11.9)., Disp: 1 each, Rfl: 0 ?  Continuous Blood Gluc Receiver ([place]) DEVI, Check blood  sugar at least 8 times a day, Disp: 1 each, Rfl: 0 ?  Continuous Blood Gluc Sensor (DEXCOM G6 SENSOR) MISC, Check blood sugar at least 8 times a day, Disp: 9 each, Rfl: 3 ?  Continuous Blood Gluc Transmit (DEXCOM G6 TRANSMITTER) MISC, Check blood sugar at least 8 times a day, Disp: 1 each, Rfl: 3 ?  diclofenac Sodium  (VOLTAREN) 1 % GEL, Apply 4 g topically 4 (four) times daily., Disp: 350 g, Rfl: 1 ?  gabapentin (NEURONTIN) 300 MG capsule, Take 3 capsules (900 mg total) by mouth 3 (three) times daily., Disp: 270 capsule, Rfl: 6 ?  insulin glargine (LANTUS SOLOSTAR) 100 UNIT/ML Solostar Pen, Inject 80 Units into the skin at bedtime., Disp: 24 mL, Rfl: 2 ?  insulin lispro (HUMALOG) 100 UNIT/ML KwikPen, INJECT 20-22 UNITS SUBCUTANEOUSLY THREE TIMES DAILY WITH MEALS, Disp: 15 mL, Rfl: 0 ?  Insulin Pen Needle 31G X 5 MM MISC, Use as directed with Lantus and Humalog, Disp: 100 each, Rfl: PRN ?  Lancets (ONETOUCH DELICA PLUS BTDVVO16W) MISC, Check blood sugar 3 times a day, Disp: 300 each, Rfl: 3 ?  lisinopril (ZESTRIL) 20 MG tablet, Take 1 tablet (20 mg total) by mouth daily., Disp: 30 tablet, Rfl: 11 ?  meclizine (ANTIVERT) 25 MG tablet, Take 1 tablet (25 mg total) by mouth 3 (three) times daily as needed for dizziness., Disp: 60 tablet, Rfl: 1 ?  megestrol (MEGACE) 40 MG tablet, Take 1 tablet (40 mg total) by mouth 2 (two) times daily. Can increase to two tablets twice a day in the event of heavy bleeding, Disp: 60 tablet, Rfl: 5 ?  metFORMIN (GLUCOPHAGE-XR) 500 MG 24 hr tablet, Take 2 tablets (1,000 mg total) by mouth 2 (two) times daily with a meal., Disp: 120 tablet, Rfl: 3 ?  Multiple Vitamin (MULTIVITAMIN WITH MINERALS) TABS tablet, Take 1 tablet by mouth every evening. , Disp: , Rfl:  ?  omeprazole (PRILOSEC) 20 MG capsule, Take 1 capsule (20 mg total) by mouth daily., Disp: 30 capsule, Rfl: 1 ?  Semaglutide, 2 MG/DOSE, 8 MG/3ML SOPN, Inject 2 mg into the skin once a week., Disp: 10 mL, Rfl: 3 ?  traMADol (ULTRAM) 50 MG tablet, Take 1-2 tablets (50-100 mg total) by mouth every 6 (six) hours as needed for severe pain or moderate pain., Disp: 20 tablet, Rfl: 0  ? ?Allergies: ?Allergies  ?Allergen Reactions  ? Aspirin Nausea And Vomiting  ? Ibuprofen Nausea And Vomiting and Other (See Comments)  ?  Abdominal pain  ? Penicillins  Other (See Comments)  ?  Unknown ?Did it involve swelling of the face/tongue/throat, SOB, or low BP? Unk ?Did it involve sudden or severe rash/hives, skin peeling, or any reaction on the inside of your mouth or nose? Unk  ?Did you need to seek medical attention at a hospital or doctor's office? Unk ?When did it last happen? Childhood      ?If all above answers are "NO", may proceed with cephalosporin use. ?  ?  ?Review of Systems: ?Review of Systems  ?Respiratory:  Negative for shortness of breath.   ?Cardiovascular:  Negative for chest pain.   ? ?Objective:  ? ?Vitals: ?Vitals:  ? 03/23/22 1040  ?BP: (!) 127/59  ?Pulse: 89  ?Temp: 98.4 ?F (36.9 ?C)  ?SpO2: 100%  ?  ?Physical Exam: ?Physical Exam ?Constitutional:   ?   Appearance: Normal appearance.  ?Pulmonary:  ?   Effort: Pulmonary effort is normal. No respiratory distress.  ?Neurological:  ?  Mental Status: She is alert.  ?Psychiatric:     ?   Mood and Affect: Mood normal.     ?   Behavior: Behavior normal.  ?  ? ?Data: Labs, imaging, and micro were reviewed in Epic. Refer to Assessment and Plan below for full details in Problem-Based Charting. ? ?Assessment & Plan: ? ?Diabetes mellitus ([place]) ?- A1C 7.8 ?- Average glucose 146 on CGM over past 2 weeks ?- Continue Lantus 80 units nightly, Lispro 20-22 units TID with meals, and Metformin 2064m daily ?- Increase ozempic to 254mdaily ?- Continue CGM ?- I'll update her gyn/onc surgeon that he diabetes has been optimized for surgery  ? ?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[place]?- Patient is medically optimized for total hysterectomy procedure with Gyn/Onc. I recommend proceeding with surgery.  ?- I have messaged her surgeon and NP to see if they need any additional information ?  ? ? ?Patient will follow up in 2 months. She would prefer to see an attending physician if possible.  ? ?JuLottie MusselMD   ?

## 2022-03-22 NOTE — Telephone Encounter (Signed)
insulin glargine (LANTUS SOLOSTAR) ? ?Send Rx to [facility] on [address]. ?

## 2022-03-23 ENCOUNTER — Encounter: Payer: Self-pay | Admitting: Dietician

## 2022-03-23 ENCOUNTER — Ambulatory Visit (INDEPENDENT_AMBULATORY_CARE_PROVIDER_SITE_OTHER): Payer: Medicaid Other | Admitting: Internal Medicine

## 2022-03-23 ENCOUNTER — Encounter: Payer: Self-pay | Admitting: Internal Medicine

## 2022-03-23 ENCOUNTER — Ambulatory Visit: Payer: Medicaid Other | Admitting: Dietician

## 2022-03-23 VITALS — BP 127/59 | HR 89 | Temp 98.4°F | Ht 73.0 in | Wt 282.6 lb

## 2022-03-23 DIAGNOSIS — Z794 Long term (current) use of insulin: Secondary | ICD-10-CM | POA: Diagnosis not present

## 2022-03-23 DIAGNOSIS — E1142 Type 2 diabetes mellitus with diabetic polyneuropathy: Secondary | ICD-10-CM

## 2022-03-23 DIAGNOSIS — C541 Malignant neoplasm of endometrium: Secondary | ICD-10-CM | POA: Diagnosis not present

## 2022-03-23 DIAGNOSIS — E119 Type 2 diabetes mellitus without complications: Secondary | ICD-10-CM | POA: Diagnosis not present

## 2022-03-23 MED ORDER — LANTUS SOLOSTAR 100 UNIT/ML ~~LOC~~ SOPN: 80.0000 [IU] | PEN_INJECTOR | Freq: Every day | SUBCUTANEOUS | 2 refills | Status: DC

## 2022-03-23 NOTE — Assessment & Plan Note (Signed)
-   A1C 7.8 ?- Average glucose 146 on CGM over past 2 weeks ?- Continue Lantus 80 units nightly, Lispro 20-22 units TID with meals, and Metformin '2000mg'$  daily ?- Increase ozempic to '2mg'$  daily ?- Continue CGM ?- I'll update her gyn/onc surgeon that he diabetes has been optimized for surgery  ?

## 2022-03-23 NOTE — Patient Instructions (Addendum)
-   You are ready for surgery from my standpoint ?- Keep taking your insulin as you are ?- You can pick up your ozempic '2mg'$  at the pharmacy ? ?We will see you back in 2 months  ?

## 2022-03-23 NOTE — Assessment & Plan Note (Signed)
-   Patient is medically optimized for total hysterectomy procedure with Gyn/Onc. I recommend proceeding with surgery.  ?- I have messaged her surgeon and NP to see if they need any additional information ?

## 2022-03-23 NOTE — Progress Notes (Signed)
BP Readings from Last 3 Encounters:  ?03/23/22 (!) 127/59  ?03/16/22 (!) 130/57  ?03/07/22 (!) 159/73  ? ?Wt Readings from Last 10 Encounters:  ?03/23/22 282 lb 9.6 oz (128.2 kg)  ?03/16/22 281 lb 11.2 oz (127.8 kg)  ?03/07/22 269 lb (122 kg)  ?02/22/22 279 lb 9.6 oz (126.8 kg)  ?12/27/21 280 lb 3.2 oz (127.1 kg)  ?12/24/21 290 lb (131.5 kg)  ?12/09/21 285 lb (129.3 kg)  ?11/17/21 296 lb 3.2 oz (134.4 kg)  ?09/22/21 298 lb 12.8 oz (135.5 kg)  ?09/08/21 (!) 300 lb 8 oz (136.3 kg)  ? ?Diabetes Self-Management Education ? ?Visit Type:  Annual Follow-Up (2nd annual follow up visit this year) ? ?Appt. Start Time: 955 Appt. End Time: 6433 ? ?03/23/2022 ? ?[name], identified by name and date of birth, is a 61 y.o. female with a diagnosis of Diabetes:   Type 2  ?ASSESSMENT ? ?Last menstrual period 06/23/2014. ?Estimated body mass index is 37.28 kg/m? as calculated from the following: ?  Height as of an earlier encounter on 03/23/22: '6\' 1"'$  (1.854 m). ?  Weight as of an earlier encounter on 03/23/22: 282 lb 9.6 oz (128.2 kg). ?She states she would like to reduce her weight to 240#. She is thinking about starting to go to the gym more often after her surgery. She was educated about the PREP class free to her.  ?  ? Diabetes Self-Management Education - 03/23/22 1100   ? ?  ? Health Coping  ? How would you rate your overall health? Good   ?  ? Patient Education  ? Previous Diabetes Education Yes (please comment)   see previous documentation  ? Monitoring Taught/evaluated CGM (comment)   reveiwed download and how to interpret and use  ? Acute complications Discussed and identified patients' prevention, symptoms, and treatment of hyperglycemia.;Taught prevention, symptoms, and  treatment of hypoglycemia - the 15 rule.   ?  ? Patient Self-Evaluation of Goals - Patient rates self as meeting previously set goals (% of time)  ? Monitoring >75% (most of the time)   ?  ? Post-Education Assessment  ? Patient understands  monitoring blood glucose, interpreting and using results Demonstrates understanding / competency   ? Patient understands prevention, detection, and treatment of acute complications. Demonstrates understanding / competency   ?  ? Outcomes  ? Program Status Completed   ?  ? Subsequent Visit  ? Since your last visit have you continued or begun to take your medications as prescribed? Yes   has not increased her Ozempic dose yet, plans to do so this week  ? Since your last visit have you had your blood pressure checked? No   ? Since your last visit have you experienced any weight changes? No change   ? Since your last visit, are you checking your blood glucose at least once a day? Yes   loves the Dexcom CGM! "The best thing that has ever"  ? ?  ?  ? ?  ? ? ?Learning Objective:  Patient will have a greater understanding of diabetes self-management. ?Patient education plan is to attend individual and/or group sessions per assessed needs and concerns. ? ? ?Plan:  ? ?There are no Patient Instructions on file for this visit. ? ? ?Expected Outcomes:  Demonstrated interest in learning. Expect positive outcomes ? ?Education material provided: Diabetes Resources ? ?If problems or questions, patient to contact team via:  Phone ? ?Future DSME appointment: - 3-4 months ?[name],  RD ?03/23/2022 ?12:01 PM. ? ?

## 2022-03-25 ENCOUNTER — Telehealth: Payer: Self-pay

## 2022-03-25 NOTE — Telephone Encounter (Signed)
Spoke with [name] this afternoon and advised her that we are still working on trying to find a spot for her surgery next week. There is a patient that Dr. Berline Lopes is waiting to see if her surgery will be cancelled. If this takes place we may be able to do you surgery on Wednesday May 5th. We will let you know first thing Monday morning if this patients surgery has been cancelled so we can move your surgery up into that spot. If it does not become available we will continue to look to find a sooner spot. Patient verbalized understanding.  ?

## 2022-03-28 ENCOUNTER — Telehealth: Payer: Self-pay | Admitting: *Deleted

## 2022-03-28 ENCOUNTER — Other Ambulatory Visit: Payer: Self-pay

## 2022-03-28 ENCOUNTER — Encounter (HOSPITAL_COMMUNITY): Payer: Self-pay | Admitting: Gynecologic Oncology

## 2022-03-28 NOTE — Progress Notes (Addendum)
COVID Vaccine Completed:  Yes x2 ?Date COVID Vaccine completed: 02-05-20 ?Has received booster:  No ?COVID vaccine manufacturer:  [employer]  ? ?Date of COVID positive in last 90 days: No ? ?PCP - Lottie Mussel, MD  ?Cardiologist - N/A ? ?Chest x-ray - 06-29-21 Epic ?EKG - 12-10-21 Epic ?Stress Test - N/A ?ECHO - N/A ?Cardiac Cath - greater than 15 years.  Symptoms turned out to be GERD ?Pacemaker/ICD device last checked: ?Spinal Cord Stimulator: ?Coronary CT - 02-25-20 Epic ? ?Bowel Prep - Light diet day before surgery, avoid gas producing foods ? ?Sleep Study - N/A ?CPAP -  ? ?Fasting Blood Sugar - 55 to 257 ?Checks Blood Sugar - Dexcom abdomen, checks multiple times a day ? ?Blood Thinner Instructions: N/A ?Aspirin Instructions: ?Last Dose: ? ?Activity level:   Can go up a flight of stairs and perform activities of daily living without stopping and without symptoms of chest pain or shortness of breath. ? ?Anesthesia review:  HTN, DM.   ? ?Patient denies shortness of breath, fever, cough and chest pain at PAT appointment ? ?Patient verbalized understanding of instructions that were given to them at the PAT appointment. Patient was also instructed that they will need to review over the PAT instructions again at home before surgery.  ?

## 2022-03-28 NOTE — Patient Instructions (Addendum)
Preparing for your Surgery ? ?Plan for surgery on Mar 30, 2022 with Dr. Jeral Pinch at [place] will be scheduled for robotic assisted total laparoscopic hysterectomy (removal of the uterus and cervix), bilateral salpingo-oophorectomy (removal of both ovaries and fallopian tubes), sentinel lymph node biopsy, possible lymph node dissection, possible laparotomy (larger incision on your abdomen if needed).  ? ?Pre-operative Testing ?-You will receive a phone call from presurgical testing at [facility] to arrange for a pre-operative appointment and lab work. ? ?-Bring your insurance card, copy of an advanced directive if applicable, medication list ? ?-At that visit, you will be asked to sign a consent for a possible blood transfusion in case a transfusion becomes necessary during surgery.  The need for a blood transfusion is rare but having consent is a necessary part of your care.    ? ?-You should not be taking blood thinners or aspirin at least ten days prior to surgery unless instructed by your surgeon. ? ?-Do not take supplements such as fish oil (omega 3), red yeast rice, turmeric before your surgery. You want to avoid medications with aspirin in them including headache powders such as BC or Goody's), Excedrin migraine. ? ?Day Before Surgery at Home ?-You will be asked to take in a light diet the day before surgery. You will be advised you can have clear liquids up until 3 hours before your surgery.   ? ?Eat a light diet the day before surgery.  Examples including soups, broths, toast, yogurt, mashed potatoes.  AVOID GAS PRODUCING FOODS. Things to avoid include carbonated beverages (fizzy beverages, sodas), raw fruits and raw vegetables (uncooked), or beans.  ? ?If your bowels are filled with gas, your surgeon will have difficulty visualizing your pelvic organs which increases your surgical risks. ? ?Your role in recovery ?Your role is to become active as soon as directed by your  doctor, while still giving yourself time to heal.  Rest when you feel tired. You will be asked to do the following in order to speed your recovery: ? ?- Cough and breathe deeply. This helps to clear and expand your lungs and can prevent pneumonia after surgery.  ?- STAY ACTIVE WHEN YOU GET HOME. Do mild physical activity. Walking or moving your legs help your circulation and body functions return to normal. Do not try to get up or walk alone the first time after surgery.   ?-If you develop swelling on one leg or the other, pain in the back of your leg, redness/warmth in one of your legs, please call the office or go to the Emergency Room to have a doppler to rule out a blood clot. For shortness of breath, chest pain-seek care in the Emergency Room as soon as possible. ?- Actively manage your pain. Managing your pain lets you move in comfort. We will ask you to rate your pain on a scale of zero to 10. It is your responsibility to tell your doctor or nurse where and how much you hurt so your pain can be treated. ? ?Special Considerations ?-If you are diabetic, you may be placed on insulin after surgery to have closer control over your blood sugars to promote healing and recovery.  This does not mean that you will be discharged on insulin.  If applicable, your oral antidiabetics will be resumed when you are tolerating a solid diet. ? ?-Your final pathology results from surgery should be available around one week after surgery and the results will  be relayed to you when available. ? ?-Dr. Lahoma Crocker is the surgeon that assists your GYN Oncologist with surgery.  If you end up staying the night, the next day after your surgery you will either see Dr. Berline Lopes or Dr. Lahoma Crocker. ? ?-FMLA forms can be faxed to [phone] and please allow 5-7 business days for completion. ? ?Pain Management After Surgery ?-You have been prescribed your pain medication and bowel regimen medications before surgery so that you  can have these available when you are discharged from the hospital. The pain medication is for use ONLY AFTER surgery and a new prescription will not be given.  ? ?-Make sure that you have Tylenol at home to use on a regular basis after surgery for pain control.  ? ?-Review the attached handout on narcotic use and their risks and side effects.  ? ?Bowel Regimen ?-You have been prescribed Sennakot-S to take nightly to prevent constipation especially if you are taking the narcotic pain medication intermittently.  It is important to prevent constipation and drink adequate amounts of liquids. You can stop taking this medication when you are not taking pain medication and you are back on your normal bowel routine. ? ?Risks of Surgery ?Risks of surgery are low but include bleeding, infection, damage to surrounding structures, re-operation, blood clots, and very rarely death. ? ? ?Blood Transfusion Information (For the consent to be signed before surgery) ? ?We will be checking your blood type before surgery so in case of emergencies, we will know what type of blood you would need. ? ?                                          WHAT IS A BLOOD TRANSFUSION? ? ?A transfusion is the replacement of blood or some of its parts. Blood is made up of multiple cells which provide different functions. ?Red blood cells carry oxygen and are used for blood loss replacement. ?White blood cells fight against infection. ?Platelets control bleeding. ?Plasma helps clot blood. ?Other blood products are available for specialized needs, such as hemophilia or other clotting disorders. ?BEFORE THE TRANSFUSION  ?Who gives blood for transfusions?  ?You may be able to donate blood to be used at a later date on yourself (autologous donation). ?Relatives can be asked to donate blood. This is generally not any safer than if you have received blood from a stranger. The same precautions are taken to ensure safety when a relative's blood is  donated. ?Healthy volunteers who are fully evaluated to make sure their blood is safe. This is blood bank blood. ?Transfusion therapy is the safest it has ever been in the practice of medicine. Before blood is taken from a donor, a complete history is taken to make sure that person has no history of diseases nor engages in risky social behavior (examples are intravenous drug use or sexual activity with multiple partners). The donor's travel history is screened to minimize risk of transmitting infections, such as malaria. The donated blood is tested for signs of infectious diseases, such as HIV and hepatitis. The blood is then tested to be sure it is compatible with you in order to minimize the chance of a transfusion reaction. If you or a relative donates blood, this is often done in anticipation of surgery and is not appropriate for emergency situations. It takes many days to process the donated blood. ?  RISKS AND COMPLICATIONS ?Although transfusion therapy is very safe and saves many lives, the main dangers of transfusion include:  ?Getting an infectious disease. ?Developing a transfusion reaction. This is an allergic reaction to something in the blood you were given. Every precaution is taken to prevent this. ?The decision to have a blood transfusion has been considered carefully by your caregiver before blood is given. Blood is not given unless the benefits outweigh the risks. ? ?AFTER SURGERY INSTRUCTIONS ? ?Return to work: 4-6 weeks if applicable ? ?Activity: ?1. Be up and out of the bed during the day.  Take a nap if needed.  You may walk up steps but be careful and use the hand rail.  Stair climbing will tire you more than you think, you may need to stop part way and rest.  ? ?2. No lifting or straining for 6 weeks over 10 pounds. No pushing, pulling, straining for 6 weeks. ? ?3. No driving for around 1 week(s).  Do not drive if you are taking narcotic pain medicine and make sure that your reaction time has  returned.  ? ?4. You can shower as soon as the next day after surgery. Shower daily.  Use your regular soap and water (not directly on the incision) and pat your incision(s) dry afterwards; don't rub.  No tub baths or su

## 2022-03-28 NOTE — Telephone Encounter (Signed)
Spoke with pt today to offer her the surgery date of 03/30/22. Pt is agreeable to new surgery date. Joylene John, NP notified.  ?

## 2022-03-28 NOTE — Progress Notes (Signed)
Patient here for a pre-operative appointment prior to her scheduled surgery on Mar 30, 2022. She is scheduled for robotic assisted total laparoscopic hysterectomy, bilateral salpingo-oophorectomy, sentinel lymph node biopsy, possible lymph node dissection, possible laparotomy. The surgery was discussed in detail.  See after visit summary for additional details. Visual aids used to discuss items related to surgery including the incentive spirometer, sequential compression stockings, foley catheter, IV pump, multi-modal pain regimen including tylenol, photo of the surgical robot, female reproductive system to discuss surgery in detail.    ?  ?Discussed post-op pain management in detail including the aspects of the enhanced recovery pathway.  Advised her that a new prescription would be sent in for tramadol and it is only to be used for after her upcoming surgery.  We discussed the use of tylenol post-op and to monitor for a maximum of 4,000 mg in a 24 hour period.  Also prescribed sennakot to be used after surgery and to hold if having loose stools.  Discussed bowel regimen in detail. Patient does not use NSAIDS. ?  ?Discussed the use of heparin pre-op, SCDs, and measures to take at home to prevent DVT including frequent mobility.  Reportable signs and symptoms of DVT discussed. Post-operative instructions discussed and expectations for after surgery. Incisional care discussed as well including reportable signs and symptoms including erythema, drainage, wound separation.  ?   ?10 minutes spent with the patient.  Verbalizing understanding of material discussed. No needs or concerns voiced at the end of the visit. Advised patient to call for any needs. Advised that her post-operative medications had been prescribed and could be picked up at any time.   ? ?This appointment is included in the global surgical bundle as pre-operative teaching and has no charge.     ?

## 2022-03-29 ENCOUNTER — Inpatient Hospital Stay: Payer: Medicaid Other | Attending: Gynecologic Oncology | Admitting: Gynecologic Oncology

## 2022-03-29 VITALS — BP 125/61 | HR 106 | Temp 98.6°F | Resp 16 | Ht 73.0 in | Wt 270.0 lb

## 2022-03-29 DIAGNOSIS — Z6835 Body mass index (BMI) 35.0-35.9, adult: Secondary | ICD-10-CM | POA: Insufficient documentation

## 2022-03-29 DIAGNOSIS — Z794 Long term (current) use of insulin: Secondary | ICD-10-CM | POA: Insufficient documentation

## 2022-03-29 DIAGNOSIS — C541 Malignant neoplasm of endometrium: Secondary | ICD-10-CM | POA: Insufficient documentation

## 2022-03-29 DIAGNOSIS — E1142 Type 2 diabetes mellitus with diabetic polyneuropathy: Secondary | ICD-10-CM | POA: Insufficient documentation

## 2022-03-29 DIAGNOSIS — E669 Obesity, unspecified: Secondary | ICD-10-CM | POA: Insufficient documentation

## 2022-03-29 DIAGNOSIS — E1165 Type 2 diabetes mellitus with hyperglycemia: Secondary | ICD-10-CM | POA: Insufficient documentation

## 2022-03-29 DIAGNOSIS — N950 Postmenopausal bleeding: Secondary | ICD-10-CM

## 2022-03-29 MED ORDER — SENNOSIDES-DOCUSATE SODIUM 8.6-50 MG PO TABS: 2.0000 | ORAL_TABLET | Freq: Every day | ORAL | 0 refills | Status: DC

## 2022-03-29 MED ORDER — TRAMADOL HCL 50 MG PO TABS: 50.0000 mg | ORAL_TABLET | Freq: Four times a day (QID) | ORAL | 0 refills | Status: DC | PRN

## 2022-03-30 ENCOUNTER — Encounter (HOSPITAL_COMMUNITY): Payer: Self-pay | Admitting: Gynecologic Oncology

## 2022-03-30 ENCOUNTER — Other Ambulatory Visit: Payer: Self-pay

## 2022-03-30 ENCOUNTER — Ambulatory Visit (HOSPITAL_COMMUNITY): Payer: Medicaid Other | Admitting: Certified Registered"

## 2022-03-30 ENCOUNTER — Encounter (HOSPITAL_COMMUNITY)
Admission: RE | Disposition: A | Discharge status: Home / Self Care | Payer: Self-pay | Source: Home / Self Care | Attending: Gynecologic Oncology

## 2022-03-30 ENCOUNTER — Ambulatory Visit (HOSPITAL_BASED_OUTPATIENT_CLINIC_OR_DEPARTMENT_OTHER): Payer: Medicaid Other | Admitting: Certified Registered"

## 2022-03-30 ENCOUNTER — Ambulatory Visit (HOSPITAL_COMMUNITY)
Admission: RE | Admit: 2022-03-30 | Discharge: 2022-03-31 | Disposition: A | Discharge status: Home / Self Care | Payer: Medicaid Other | Attending: Gynecologic Oncology | Admitting: Gynecologic Oncology

## 2022-03-30 DIAGNOSIS — Z87891 Personal history of nicotine dependence: Secondary | ICD-10-CM | POA: Insufficient documentation

## 2022-03-30 DIAGNOSIS — Z7984 Long term (current) use of oral hypoglycemic drugs: Secondary | ICD-10-CM | POA: Insufficient documentation

## 2022-03-30 DIAGNOSIS — Z79899 Other long term (current) drug therapy: Secondary | ICD-10-CM | POA: Diagnosis not present

## 2022-03-30 DIAGNOSIS — Z6835 Body mass index (BMI) 35.0-35.9, adult: Secondary | ICD-10-CM | POA: Diagnosis not present

## 2022-03-30 DIAGNOSIS — G709 Myoneural disorder, unspecified: Secondary | ICD-10-CM | POA: Diagnosis not present

## 2022-03-30 DIAGNOSIS — Z794 Long term (current) use of insulin: Secondary | ICD-10-CM | POA: Diagnosis not present

## 2022-03-30 DIAGNOSIS — I10 Essential (primary) hypertension: Secondary | ICD-10-CM | POA: Diagnosis not present

## 2022-03-30 DIAGNOSIS — E119 Type 2 diabetes mellitus without complications: Secondary | ICD-10-CM

## 2022-03-30 DIAGNOSIS — C541 Malignant neoplasm of endometrium: Secondary | ICD-10-CM

## 2022-03-30 DIAGNOSIS — E1165 Type 2 diabetes mellitus with hyperglycemia: Secondary | ICD-10-CM | POA: Insufficient documentation

## 2022-03-30 DIAGNOSIS — K660 Peritoneal adhesions (postprocedural) (postinfection): Secondary | ICD-10-CM

## 2022-03-30 DIAGNOSIS — K219 Gastro-esophageal reflux disease without esophagitis: Secondary | ICD-10-CM | POA: Insufficient documentation

## 2022-03-30 DIAGNOSIS — N736 Female pelvic peritoneal adhesions (postinfective): Secondary | ICD-10-CM | POA: Diagnosis not present

## 2022-03-30 HISTORY — PX: SENTINEL NODE BIOPSY: SHX6608

## 2022-03-30 HISTORY — PX: ROBOTIC ASSISTED TOTAL HYSTERECTOMY WITH BILATERAL SALPINGO OOPHERECTOMY: SHX6086

## 2022-03-30 HISTORY — PX: CYSTOSCOPY: SHX5120

## 2022-03-30 HISTORY — DX: Malignant neoplasm of endometrium: C54.1

## 2022-03-30 LAB — COMPREHENSIVE METABOLIC PANEL
ALT: 15 U/L (ref 0–44)
AST: 17 U/L (ref 15–41)
Albumin: 4 g/dL (ref 3.5–5.0)
Alkaline Phosphatase: 64 U/L (ref 38–126)
Anion gap: 8 (ref 5–15)
BUN: 18 mg/dL (ref 6–20)
CO2: 22 mmol/L (ref 22–32)
Calcium: 9.3 mg/dL (ref 8.9–10.3)
Chloride: 109 mmol/L (ref 98–111)
Creatinine, Ser: 0.79 mg/dL (ref 0.44–1.00)
GFR, Estimated: >60 mL/min (ref >60)
Glucose, Bld: 124 mg/dL — ABNORMAL HIGH (ref 70–99)
Potassium: 4.4 mmol/L (ref 3.5–5.1)
Sodium: 139 mmol/L (ref 135–145)
Total Bilirubin: 0.5 mg/dL (ref 0.3–1.2)
Total Protein: 7.5 g/dL (ref 6.5–8.1)

## 2022-03-30 LAB — CBC
HCT: 35.4 % — ABNORMAL LOW (ref 36.0–46.0)
Hemoglobin: 11 g/dL — ABNORMAL LOW (ref 12.0–15.0)
MCH: 31.5 pg (ref 26.0–34.0)
MCHC: 31.1 g/dL (ref 30.0–36.0)
MCV: 101.4 fL — ABNORMAL HIGH (ref 80.0–100.0)
Platelets: 345 10*3/uL (ref 150–400)
RBC: 3.49 MIL/uL — ABNORMAL LOW (ref 3.87–5.11)
RDW: 13.3 % (ref 11.5–15.5)
WBC: 8 10*3/uL (ref 4.0–10.5)
nRBC: 0 % (ref 0.0–0.2)

## 2022-03-30 LAB — GLUCOSE, CAPILLARY
Glucose-Capillary: 109 mg/dL — ABNORMAL HIGH (ref 70–99)
Glucose-Capillary: 154 mg/dL — ABNORMAL HIGH (ref 70–99)
Glucose-Capillary: 235 mg/dL — ABNORMAL HIGH (ref 70–99)

## 2022-03-30 LAB — TYPE AND SCREEN
ABO/RH(D): B POS
Antibody Screen: NEGATIVE

## 2022-03-30 SURGERY — HYSTERECTOMY, TOTAL, ROBOT-ASSISTED, LAPAROSCOPIC, WITH BILATERAL SALPINGO-OOPHORECTOMY
Anesthesia: General

## 2022-03-30 MED ORDER — ACETAMINOPHEN 500 MG PO TABS
1000.0000 mg | ORAL_TABLET | ORAL | Status: AC
  Administered 2022-03-30: 1000 mg via ORAL
  Filled 2022-03-30: qty 2

## 2022-03-30 MED ORDER — MIDAZOLAM HCL 2 MG/2ML IJ SOLN
INTRAMUSCULAR | Status: DC | PRN
  Administered 2022-03-30: 2 mg via INTRAVENOUS

## 2022-03-30 MED ORDER — MIDAZOLAM HCL 2 MG/2ML IJ SOLN
INTRAMUSCULAR | Status: AC
Start: 2022-03-30 — End: ?
  Filled 2022-03-30: qty 2

## 2022-03-30 MED ORDER — BUPIVACAINE LIPOSOME 1.3 % IJ SUSP
INTRAMUSCULAR | Status: AC
  Filled 2022-03-30: qty 20

## 2022-03-30 MED ORDER — GABAPENTIN 300 MG PO CAPS
900.0000 mg | ORAL_CAPSULE | Freq: Three times a day (TID) | ORAL | Status: DC
  Administered 2022-03-30 – 2022-03-31 (×2): 900 mg via ORAL
  Filled 2022-03-30 (×2): qty 3

## 2022-03-30 MED ORDER — BUPIVACAINE LIPOSOME 1.3 % IJ SUSP
INTRAMUSCULAR | Status: DC | PRN
Start: 2022-03-30 — End: 2022-03-30
  Administered 2022-03-30: 20 mL

## 2022-03-30 MED ORDER — DEXAMETHASONE SODIUM PHOSPHATE 10 MG/ML IJ SOLN
INTRAMUSCULAR | Status: AC
  Filled 2022-03-30: qty 1

## 2022-03-30 MED ORDER — OXYCODONE HCL 5 MG PO TABS
5.0000 mg | ORAL_TABLET | ORAL | Status: DC | PRN
  Administered 2022-03-31 (×2): 10 mg via ORAL
  Filled 2022-03-30 (×3): qty 2

## 2022-03-30 MED ORDER — CEFAZOLIN IN SODIUM CHLORIDE 3-0.9 GM/100ML-% IV SOLN
INTRAVENOUS | Status: AC
  Filled 2022-03-30: qty 100

## 2022-03-30 MED ORDER — KCL IN DEXTROSE-NACL 20-5-0.45 MEQ/L-%-% IV SOLN
INTRAVENOUS | Status: DC
  Filled 2022-03-30 (×3): qty 1000

## 2022-03-30 MED ORDER — OXYCODONE HCL 5 MG PO TABS
ORAL_TABLET | ORAL | Status: AC
  Administered 2022-03-30: 5 mg via ORAL
  Filled 2022-03-30: qty 1

## 2022-03-30 MED ORDER — ATORVASTATIN CALCIUM 40 MG PO TABS
80.0000 mg | ORAL_TABLET | Freq: Every day | ORAL | Status: DC
  Administered 2022-03-31: 80 mg via ORAL
  Filled 2022-03-30: qty 2

## 2022-03-30 MED ORDER — STERILE WATER FOR INJECTION IJ SOLN
INTRAMUSCULAR | Status: AC
  Filled 2022-03-30: qty 10

## 2022-03-30 MED ORDER — LACTATED RINGERS IV SOLN: INTRAVENOUS | Status: DC

## 2022-03-30 MED ORDER — PHENYLEPHRINE HCL-NACL 20-0.9 MG/250ML-% IV SOLN
INTRAVENOUS | Status: DC | PRN
  Administered 2022-03-30: 20 ug/min via INTRAVENOUS

## 2022-03-30 MED ORDER — PANTOPRAZOLE SODIUM 40 MG PO TBEC
40.0000 mg | DELAYED_RELEASE_TABLET | Freq: Every day | ORAL | Status: DC
  Administered 2022-03-31: 40 mg via ORAL
  Filled 2022-03-30: qty 1

## 2022-03-30 MED ORDER — LIDOCAINE HCL (PF) 2 % IJ SOLN
INTRAMUSCULAR | Status: AC
  Filled 2022-03-30: qty 5

## 2022-03-30 MED ORDER — FENTANYL CITRATE (PF) 100 MCG/2ML IJ SOLN
INTRAMUSCULAR | Status: AC
  Filled 2022-03-30: qty 2

## 2022-03-30 MED ORDER — KETOROLAC TROMETHAMINE 15 MG/ML IJ SOLN
15.0000 mg | INTRAMUSCULAR | Status: AC
  Administered 2022-03-30: 15 mg via INTRAVENOUS
  Filled 2022-03-30: qty 1

## 2022-03-30 MED ORDER — ONDANSETRON HCL 4 MG/2ML IJ SOLN: 4.0000 mg | Freq: Once | INTRAMUSCULAR | Status: DC | PRN

## 2022-03-30 MED ORDER — BUPIVACAINE HCL 0.25 % IJ SOLN
INTRAMUSCULAR | Status: AC
  Filled 2022-03-30: qty 1

## 2022-03-30 MED ORDER — LIDOCAINE 2% (20 MG/ML) 5 ML SYRINGE
INTRAMUSCULAR | Status: DC | PRN
Start: 2022-03-30 — End: 2022-03-30
  Administered 2022-03-30: 60 mg via INTRAVENOUS

## 2022-03-30 MED ORDER — ACETAMINOPHEN 500 MG PO TABS
1000.0000 mg | ORAL_TABLET | Freq: Four times a day (QID) | ORAL | Status: DC
  Administered 2022-03-31 (×3): 1000 mg via ORAL
  Filled 2022-03-30 (×3): qty 2

## 2022-03-30 MED ORDER — CEFAZOLIN IN SODIUM CHLORIDE 3-0.9 GM/100ML-% IV SOLN
3.0000 g | INTRAVENOUS | Status: AC
  Administered 2022-03-30 (×2): 3 g via INTRAVENOUS
  Filled 2022-03-30: qty 100

## 2022-03-30 MED ORDER — PROPOFOL 10 MG/ML IV BOLUS
INTRAVENOUS | Status: AC
  Filled 2022-03-30: qty 20

## 2022-03-30 MED ORDER — INSULIN ASPART 100 UNIT/ML IJ SOLN
0.0000 [IU] | Freq: Three times a day (TID) | INTRAMUSCULAR | Status: DC
  Administered 2022-03-31: 8 [IU] via SUBCUTANEOUS
  Administered 2022-03-31: 5 [IU] via SUBCUTANEOUS

## 2022-03-30 MED ORDER — ONDANSETRON HCL 4 MG/2ML IJ SOLN
INTRAMUSCULAR | Status: AC
  Filled 2022-03-30: qty 2

## 2022-03-30 MED ORDER — INSULIN ASPART 100 UNIT/ML IJ SOLN
0.0000 [IU] | Freq: Every day | INTRAMUSCULAR | Status: DC
  Administered 2022-03-30: 2 [IU] via SUBCUTANEOUS

## 2022-03-30 MED ORDER — KETAMINE HCL 10 MG/ML IJ SOLN
INTRAMUSCULAR | Status: AC
  Filled 2022-03-30: qty 1

## 2022-03-30 MED ORDER — FENTANYL CITRATE PF 50 MCG/ML IJ SOSY
PREFILLED_SYRINGE | INTRAMUSCULAR | Status: AC
  Administered 2022-03-30: 50 ug via INTRAVENOUS
  Filled 2022-03-30: qty 2

## 2022-03-30 MED ORDER — EPHEDRINE 5 MG/ML INJ
INTRAVENOUS | Status: AC
  Filled 2022-03-30: qty 5

## 2022-03-30 MED ORDER — ROCURONIUM BROMIDE 10 MG/ML (PF) SYRINGE
PREFILLED_SYRINGE | INTRAVENOUS | Status: AC
  Filled 2022-03-30: qty 10

## 2022-03-30 MED ORDER — LACTATED RINGERS IV SOLN: INTRAVENOUS | Status: DC | PRN

## 2022-03-30 MED ORDER — STERILE WATER FOR INJECTION IJ SOLN
INTRAMUSCULAR | Status: AC
  Filled 2022-03-30: qty 20

## 2022-03-30 MED ORDER — PHENYLEPHRINE 80 MCG/ML (10ML) SYRINGE FOR IV PUSH (FOR BLOOD PRESSURE SUPPORT)
PREFILLED_SYRINGE | INTRAVENOUS | Status: AC
  Filled 2022-03-30: qty 10

## 2022-03-30 MED ORDER — SUGAMMADEX SODIUM 200 MG/2ML IV SOLN
INTRAVENOUS | Status: DC | PRN
Start: 2022-03-30 — End: 2022-03-30
  Administered 2022-03-30: 300 mg via INTRAVENOUS

## 2022-03-30 MED ORDER — STERILE WATER FOR IRRIGATION IR SOLN
Status: DC | PRN
  Administered 2022-03-30: 1000 mL

## 2022-03-30 MED ORDER — OXYCODONE HCL 5 MG PO TABS: 5.0000 mg | ORAL_TABLET | Freq: Once | ORAL | Status: AC | PRN

## 2022-03-30 MED ORDER — HYDROMORPHONE HCL 1 MG/ML IJ SOLN
0.5000 mg | INTRAMUSCULAR | Status: DC | PRN
  Administered 2022-03-30: 0.5 mg via INTRAVENOUS
  Filled 2022-03-30: qty 0.5

## 2022-03-30 MED ORDER — ESMOLOL HCL 100 MG/10ML IV SOLN
INTRAVENOUS | Status: DC | PRN
  Administered 2022-03-30: 30 mg via INTRAVENOUS

## 2022-03-30 MED ORDER — FENTANYL CITRATE (PF) 250 MCG/5ML IJ SOLN
INTRAMUSCULAR | Status: DC | PRN
  Administered 2022-03-30: 100 ug via INTRAVENOUS
  Administered 2022-03-30 (×5): 50 ug via INTRAVENOUS

## 2022-03-30 MED ORDER — SENNOSIDES-DOCUSATE SODIUM 8.6-50 MG PO TABS
2.0000 | ORAL_TABLET | Freq: Every day | ORAL | Status: DC
  Administered 2022-03-30: 2 via ORAL
  Filled 2022-03-30: qty 2

## 2022-03-30 MED ORDER — ONDANSETRON HCL 4 MG/2ML IJ SOLN: 4.0000 mg | Freq: Four times a day (QID) | INTRAMUSCULAR | Status: DC | PRN

## 2022-03-30 MED ORDER — KETAMINE HCL 10 MG/ML IJ SOLN
INTRAMUSCULAR | Status: DC | PRN
  Administered 2022-03-30: 10 mg via INTRAVENOUS
  Administered 2022-03-30: 35 mg via INTRAVENOUS

## 2022-03-30 MED ORDER — SUGAMMADEX SODIUM 500 MG/5ML IV SOLN
INTRAVENOUS | Status: AC
  Filled 2022-03-30: qty 5

## 2022-03-30 MED ORDER — HEPARIN SODIUM (PORCINE) 5000 UNIT/ML IJ SOLN
5000.0000 [IU] | INTRAMUSCULAR | Status: AC
  Administered 2022-03-30: 5000 [IU] via SUBCUTANEOUS
  Filled 2022-03-30: qty 1

## 2022-03-30 MED ORDER — ENOXAPARIN SODIUM 40 MG/0.4ML IJ SOSY: 40.0000 mg | PREFILLED_SYRINGE | INTRAMUSCULAR | Status: DC

## 2022-03-30 MED ORDER — MECLIZINE HCL 25 MG PO TABS: 25.0000 mg | ORAL_TABLET | Freq: Three times a day (TID) | ORAL | Status: DC | PRN

## 2022-03-30 MED ORDER — ONDANSETRON HCL 4 MG PO TABS: 4.0000 mg | ORAL_TABLET | Freq: Four times a day (QID) | ORAL | Status: DC | PRN

## 2022-03-30 MED ORDER — INSULIN GLARGINE-YFGN 100 UNIT/ML ~~LOC~~ SOLN
40.0000 [IU] | Freq: Every day | SUBCUTANEOUS | Status: DC
  Administered 2022-03-30: 40 [IU] via SUBCUTANEOUS
  Filled 2022-03-30 (×2): qty 0.4

## 2022-03-30 MED ORDER — OXYCODONE HCL 5 MG/5ML PO SOLN: 5.0000 mg | Freq: Once | ORAL | Status: AC | PRN

## 2022-03-30 MED ORDER — SCOPOLAMINE 1 MG/3DAYS TD PT72
1.0000 | MEDICATED_PATCH | TRANSDERMAL | Status: DC
  Administered 2022-03-30: 1.5 mg via TRANSDERMAL
  Filled 2022-03-30: qty 1

## 2022-03-30 MED ORDER — DEXAMETHASONE SODIUM PHOSPHATE 10 MG/ML IJ SOLN
INTRAMUSCULAR | Status: DC | PRN
  Administered 2022-03-30: 4 mg via INTRAVENOUS

## 2022-03-30 MED ORDER — OXYCODONE HCL 5 MG PO TABS: 5.0000 mg | ORAL_TABLET | ORAL | Status: DC | PRN

## 2022-03-30 MED ORDER — STERILE WATER FOR INJECTION IJ SOLN
INTRAMUSCULAR | Status: DC | PRN
  Administered 2022-03-30: 10 mL

## 2022-03-30 MED ORDER — METFORMIN HCL ER 500 MG PO TB24
1000.0000 mg | ORAL_TABLET | Freq: Two times a day (BID) | ORAL | Status: DC
  Administered 2022-03-31: 1000 mg via ORAL
  Filled 2022-03-30: qty 2

## 2022-03-30 MED ORDER — FENTANYL CITRATE PF 50 MCG/ML IJ SOSY
25.0000 ug | PREFILLED_SYRINGE | INTRAMUSCULAR | Status: DC | PRN
  Administered 2022-03-30: 50 ug via INTRAVENOUS

## 2022-03-30 MED ORDER — LACTATED RINGERS IR SOLN
Status: DC | PRN
  Administered 2022-03-30: 1000 mL

## 2022-03-30 MED ORDER — ACETAMINOPHEN 160 MG/5ML PO SOLN: 325.0000 mg | ORAL | Status: DC | PRN

## 2022-03-30 MED ORDER — TRAMADOL HCL 50 MG PO TABS
100.0000 mg | ORAL_TABLET | Freq: Four times a day (QID) | ORAL | Status: DC | PRN
  Administered 2022-03-30 – 2022-03-31 (×2): 100 mg via ORAL
  Filled 2022-03-30 (×2): qty 2

## 2022-03-30 MED ORDER — MEPERIDINE HCL 50 MG/ML IJ SOLN: 6.2500 mg | INTRAMUSCULAR | Status: DC | PRN

## 2022-03-30 MED ORDER — ESMOLOL HCL 100 MG/10ML IV SOLN
INTRAVENOUS | Status: AC
  Filled 2022-03-30: qty 10

## 2022-03-30 MED ORDER — ORAL CARE MOUTH RINSE: 15.0000 mL | Freq: Once | OROMUCOSAL | Status: AC

## 2022-03-30 MED ORDER — ROCURONIUM BROMIDE 10 MG/ML (PF) SYRINGE
PREFILLED_SYRINGE | INTRAVENOUS | Status: DC | PRN
Start: 2022-03-30 — End: 2022-03-30
  Administered 2022-03-30: 20 mg via INTRAVENOUS
  Administered 2022-03-30: 10 mg via INTRAVENOUS
  Administered 2022-03-30: 20 mg via INTRAVENOUS
  Administered 2022-03-30: 10 mg via INTRAVENOUS
  Administered 2022-03-30: 20 mg via INTRAVENOUS
  Administered 2022-03-30: 80 mg via INTRAVENOUS
  Administered 2022-03-30 (×2): 20 mg via INTRAVENOUS

## 2022-03-30 MED ORDER — CHLORHEXIDINE GLUCONATE 0.12 % MT SOLN
15.0000 mL | Freq: Once | OROMUCOSAL | Status: AC
  Administered 2022-03-30: 15 mL via OROMUCOSAL

## 2022-03-30 MED ORDER — PROPOFOL 10 MG/ML IV BOLUS
INTRAVENOUS | Status: DC | PRN
  Administered 2022-03-30: 200 mg via INTRAVENOUS

## 2022-03-30 MED ORDER — BUPIVACAINE HCL 0.25 % IJ SOLN
INTRAMUSCULAR | Status: DC | PRN
  Administered 2022-03-30: 35 mL

## 2022-03-30 MED ORDER — INDOCYANINE GREEN 25 MG IV SOLR
INTRAVENOUS | Status: DC | PRN
  Administered 2022-03-30: 2.5 mg

## 2022-03-30 MED ORDER — ENOXAPARIN SODIUM 40 MG/0.4ML IJ SOSY
40.0000 mg | PREFILLED_SYRINGE | INTRAMUSCULAR | Status: DC
  Administered 2022-03-31: 40 mg via SUBCUTANEOUS
  Filled 2022-03-30: qty 0.4

## 2022-03-30 MED ORDER — ACETAMINOPHEN 325 MG PO TABS: 325.0000 mg | ORAL_TABLET | ORAL | Status: DC | PRN

## 2022-03-30 SURGICAL SUPPLY — 83 items
APPLICATOR ARISTA FLEXITIP XL (MISCELLANEOUS) ×1 IMPLANT
APPLICATOR SURGIFLO ENDO (HEMOSTASIS) IMPLANT
BACTOSHIELD CHG 4% 4OZ (MISCELLANEOUS) ×1
BAG LAPAROSCOPIC 12 15 PORT 16 (BASKET) IMPLANT
BAG RETRIEVAL 10 (BASKET) ×2
BAG RETRIEVAL 12/15 (BASKET) ×8
BLADE SURG SZ10 CARB STEEL (BLADE) ×1 IMPLANT
CELLS DAT CNTRL 66122 CELL SVR (MISCELLANEOUS) ×3 IMPLANT
CHLORAPREP W/TINT 26 (MISCELLANEOUS) ×1 IMPLANT
COVER BACK TABLE 60X90IN (DRAPES) ×4 IMPLANT
COVER TIP SHEARS 8 DVNC (MISCELLANEOUS) ×3 IMPLANT
COVER TIP SHEARS 8MM DA VINCI (MISCELLANEOUS) ×1
DERMABOND ADVANCED (GAUZE/BANDAGES/DRESSINGS) ×1
DERMABOND ADVANCED .7 DNX12 (GAUZE/BANDAGES/DRESSINGS) ×3 IMPLANT
DRAPE ARM DVNC X/XI (DISPOSABLE) ×12 IMPLANT
DRAPE COLUMN DVNC XI (DISPOSABLE) ×3 IMPLANT
DRAPE DA VINCI XI ARM (DISPOSABLE) ×4
DRAPE DA VINCI XI COLUMN (DISPOSABLE) ×1
DRAPE SHEET LG 3/4 BI-LAMINATE (DRAPES) ×4 IMPLANT
DRAPE SURG IRRIG POUCH 19X23 (DRAPES) ×4 IMPLANT
DRSG OPSITE POSTOP 4X6 (GAUZE/BANDAGES/DRESSINGS) ×1 IMPLANT
DRSG OPSITE POSTOP 4X8 (GAUZE/BANDAGES/DRESSINGS) IMPLANT
DRSG TEGADERM 4X4.75 (GAUZE/BANDAGES/DRESSINGS) ×1 IMPLANT
DRSG TEGADERM 8X12 (GAUZE/BANDAGES/DRESSINGS) ×1 IMPLANT
ELECT PENCIL ROCKER SW 15FT (MISCELLANEOUS) ×1 IMPLANT
ELECT REM PT RETURN 15FT ADLT (MISCELLANEOUS) ×4 IMPLANT
GAUZE 4X4 16PLY ~~LOC~~+RFID DBL (SPONGE) ×4 IMPLANT
GLOVE BIO SURGEON STRL SZ 6 (GLOVE) ×16 IMPLANT
GLOVE BIO SURGEON STRL SZ 6.5 (GLOVE) ×8 IMPLANT
GOWN STRL REUS W/ TWL LRG LVL3 (GOWN DISPOSABLE) ×12 IMPLANT
GOWN STRL REUS W/TWL LRG LVL3 (GOWN DISPOSABLE) ×5
HEMOSTAT ARISTA ABSORB 3G PWDR (HEMOSTASIS) ×1 IMPLANT
HOLDER FOLEY CATH W/STRAP (MISCELLANEOUS) IMPLANT
IRRIG SUCT STRYKERFLOW 2 WTIP (MISCELLANEOUS) ×4
IRRIGATION SUCT STRKRFLW 2 WTP (MISCELLANEOUS) ×3 IMPLANT
KIT PROCEDURE DA VINCI SI (MISCELLANEOUS) ×1
KIT PROCEDURE DVNC SI (MISCELLANEOUS) IMPLANT
KIT TURNOVER KIT A (KITS) IMPLANT
LIGASURE IMPACT 36 18CM CVD LR (INSTRUMENTS) IMPLANT
LIGASURE VESSEL 5MM BLUNT TIP (ELECTROSURGICAL) ×1 IMPLANT
MANIPULATOR ADVINCU DEL 3.0 PL (MISCELLANEOUS) ×1 IMPLANT
MANIPULATOR ADVINCU DEL 3.5 PL (MISCELLANEOUS) IMPLANT
MANIPULATOR UTERINE 4.5 ZUMI (MISCELLANEOUS) IMPLANT
NDL HYPO 21X1.5 SAFETY (NEEDLE) ×3 IMPLANT
NDL SPNL 18GX3.5 QUINCKE PK (NEEDLE) IMPLANT
NEEDLE HYPO 21X1.5 SAFETY (NEEDLE) ×8 IMPLANT
NEEDLE SPNL 18GX3.5 QUINCKE PK (NEEDLE) ×4 IMPLANT
OBTURATOR OPTICAL STANDARD 8MM (TROCAR) ×1
OBTURATOR OPTICAL STND 8 DVNC (TROCAR) ×3
OBTURATOR OPTICALSTD 8 DVNC (TROCAR) ×3 IMPLANT
PACK ROBOT GYN CUSTOM WL (TRAY / TRAY PROCEDURE) ×4 IMPLANT
PAD POSITIONING PINK XL (MISCELLANEOUS) ×4 IMPLANT
PORT ACCESS TROCAR AIRSEAL 12 (TROCAR) ×3 IMPLANT
PORT ACCESS TROCAR AIRSEAL 5M (TROCAR) ×1
RETRACTOR WND ALEXIS 18 MED (MISCELLANEOUS) IMPLANT
RTRCTR WOUND ALEXIS 18CM MED (MISCELLANEOUS) ×4
SCRUB CHG 4% DYNA-HEX 4OZ (MISCELLANEOUS) ×3 IMPLANT
SEAL CANN UNIV 5-8 DVNC XI (MISCELLANEOUS) ×12 IMPLANT
SEAL XI 5MM-8MM UNIVERSAL (MISCELLANEOUS) ×4
SET TRI-LUMEN FLTR TB AIRSEAL (TUBING) ×4 IMPLANT
SPIKE FLUID TRANSFER (MISCELLANEOUS) ×5 IMPLANT
SPONGE T-LAP 18X18 ~~LOC~~+RFID (SPONGE) ×1 IMPLANT
SURGIFLO W/THROMBIN 8M KIT (HEMOSTASIS) IMPLANT
SUT MNCRL AB 4-0 PS2 18 (SUTURE) ×1 IMPLANT
SUT PDS AB 1 TP1 96 (SUTURE) ×2 IMPLANT
SUT VIC AB 0 CT1 27 (SUTURE) ×1
SUT VIC AB 0 CT1 27XBRD ANTBC (SUTURE) IMPLANT
SUT VIC AB 2-0 CT1 27 (SUTURE) ×2
SUT VIC AB 2-0 CT1 TAPERPNT 27 (SUTURE) IMPLANT
SUT VIC AB 4-0 PS2 18 (SUTURE) ×8 IMPLANT
SUT VICRYL 4-0 PS2 18IN ABS (SUTURE) ×1 IMPLANT
SYR 10ML LL (SYRINGE) ×1 IMPLANT
SYS BAG RETRIEVAL 10MM (BASKET) ×6
SYS WOUND ALEXIS 18CM MED (MISCELLANEOUS)
SYSTEM BAG RETRIEVAL 10MM (BASKET) ×3 IMPLANT
SYSTEM WOUND ALEXIS 18CM MED (MISCELLANEOUS) IMPLANT
TOWEL OR NON WOVEN STRL DISP B (DISPOSABLE) IMPLANT
TRAP SPECIMEN MUCUS 40CC (MISCELLANEOUS) IMPLANT
TRAY FOLEY MTR SLVR 16FR STAT (SET/KITS/TRAYS/PACK) ×5 IMPLANT
TROCAR XCEL NON-BLD 5MMX100MML (ENDOMECHANICALS) IMPLANT
UNDERPAD 30X36 HEAVY ABSORB (UNDERPADS AND DIAPERS) ×8 IMPLANT
WATER STERILE IRR 1000ML POUR (IV SOLUTION) ×4 IMPLANT
YANKAUER SUCT BULB TIP 10FT TU (MISCELLANEOUS) ×1 IMPLANT

## 2022-03-30 NOTE — Anesthesia Postprocedure Evaluation (Signed)
Anesthesia Post Note ? ?Patient: [name] ? ?Procedure(s) Performed: XI ROBOTIC ASSISTED TOTAL HYSTERECTOMY WITH BILATERAL SALPINGO-OOPHORECTOMY, MINI LAPAROTOMY (Bilateral) ?SENTINEL NODE BIOPSY ?CYSTOSCOPY ? ?  ? ?Patient location during evaluation: PACU ?Anesthesia Type: General ?Level of consciousness: awake and alert ?Pain management: pain level controlled ?Vital Signs Assessment: post-procedure vital signs reviewed and stable ?Respiratory status: spontaneous breathing, nonlabored ventilation, respiratory function stable and patient connected to nasal cannula oxygen ?Cardiovascular status: blood pressure returned to baseline and stable ?Postop Assessment: no apparent nausea or vomiting ?Anesthetic complications: no ? ? ?No notable events documented. ? ?Last Vitals:  ?Vitals:  ? 03/30/22 1804 03/30/22 1851  ?BP: (!) 159/73 (!) 151/70  ?Pulse: (!) 121 (!) 122  ?Resp: 18 15  ?Temp: 36.6 ?C (!) 36.4 ?C  ?SpO2: 100% 100%  ?  ?Last Pain:  ?Vitals:  ? 03/30/22 1851  ?TempSrc: Oral  ?PainSc:   ? ? ?  ?  ?  ?  ?  ?  ? ?[name] ? ? ? ? ?

## 2022-03-30 NOTE — Anesthesia Procedure Notes (Addendum)
Procedure Name: Intubation ?Date/Time: 03/30/2022 11:55 AM ?Performed by: Eben Burow, CRNA ?Pre-anesthesia Checklist: Patient identified, Emergency Drugs available, Suction available, Patient being monitored and Timeout performed ?Patient Re-evaluated:Patient Re-evaluated prior to induction ?Oxygen Delivery Method: Circle system utilized ?Preoxygenation: Pre-oxygenation with 100% oxygen ?Induction Type: IV induction ?Ventilation: Mask ventilation without difficulty ?Laryngoscope Size: Mac and 4 ?Grade View: Grade I ?Tube type: Oral ?Tube size: 7.5 mm ?Number of attempts: 1 ?Airway Equipment and Method: Stylet ?Placement Confirmation: ETT inserted through vocal cords under direct vision, positive ETCO2 and breath sounds checked- equal and bilateral ?Secured at: 23 cm ?Tube secured with: Tape ?Dental Injury: Teeth and Oropharynx as per pre-operative assessment  ? ? ? ? ?

## 2022-03-30 NOTE — Interval H&P Note (Signed)
History and Physical Interval Note: ? ?03/30/2022 ?10:50 AM ? ?[name]  has presented today for surgery, with the diagnosis of ENDOMETRIAL CANCER.  The various methods of treatment have been discussed with the patient and family. After consideration of risks, benefits and other options for treatment, the patient has consented to  Procedure(s): ?XI ROBOTIC ASSISTED TOTAL HYSTERECTOMY WITH BILATERAL SALPINGO-OOPHORECTOMY,POSSIBLE LAPAROTOMY (Bilateral) ?SENTINEL NODE BIOPSY (N/A) ?POSSIBLE LYMPH NODE DISSECTION (N/A) as a surgical intervention.  The patient's history has been reviewed, patient examined, no change in status, stable for surgery.  I have reviewed the patient's chart and labs.  Questions were answered to the patient's satisfaction.   ? ? ?[name] ? ? ?

## 2022-03-30 NOTE — Op Note (Addendum)
OPERATIVE NOTE ? ?Pre-operative Diagnosis: carcinosarcoma ? ?Post-operative Diagnosis: same, significant abdominal and pelvic adhesive disease ? ?Operation: Robotic-assisted laparoscopic total hysterectomy with bilateral salpingoophorectomy, SLN biopsy on the right, left pelvic lymphadenectomy, lysis of adhesions for approximately 90 minutes, mini-lap for specimen removal, cystoscopy  ?Obesity made retroperitoneal visualization limited and increased the complexity of the case and necessitated additional instrumentation for retraction. Obesity related complexity increased the duration of the procedure by 45 minutes.  ? ?Surgeon: Jeral Pinch MD ? ?Assistant Surgeon: Lahoma Crocker MD (an MD assistant was necessary for tissue manipulation, management of robotic instrumentation, retraction and positioning due to the complexity of the case and hospital policies).  ? ?Anesthesia: GET ? ?Urine Output: 420cc ? ?Operative Findings: On EUA, 12cm minimally mobile uterus. On intra-abdominal exam, normal upper abdominal survey. Omentum adherent to the anterior abdominal wall along the prior midline incision. Normal omentum otherwise, normal small and large bowel. 12 cm uterus densely adherent to the anterior abdominal wall, obliterating some of the anterior anatomy including the anterior cul de sac. Normal appearing bilateral adnexa. Mapping successful to right obturator SLN, mildly enlarged. Dye seen within the parametrium on the left, no SLNs identified. No obvious adenopathy on the left. Decision made given length of surgery, comorbitidies to defer left para-aortic lymphadenectomy. Mini-lap required for specimen delivery. ?Dome intact on cystoscopy and good efflux noted from bilateral ureteral orifices. ? ?Estimated Blood Loss:  150cc     ? ?Total IV Fluids: see I&O flowsheet ?        ?Specimens: uterus, cervix, bilateral tubes and ovaries, right obturator SLN, left pelvic lymph nodes ?        ?Complications:  None  apparent; patient tolerated the procedure well. ?        ?Disposition: PACU - hemodynamically stable. ? ?Procedure Details  ?The patient was seen in the Holding Room. The risks, benefits, complications, treatment options, and expected outcomes were discussed with the patient.  The patient concurred with the proposed plan, giving informed consent.  The site of surgery properly noted/marked. The patient was identified as [name] and the procedure verified as a Robotic-assisted hysterectomy with bilateral salpingo oophorectomy with SLN biopsy.  ? ?After induction of anesthesia, the patient was draped and prepped in the usual sterile manner. Patient was placed in supine position after anesthesia and draped and prepped in the usual sterile manner as follows: Her arms were tucked to her side with all appropriate precautions.  The patient was then secured to the bed with tape across her chest.  The patient was placed in the semi-lithotomy position in [place].  The perineum and vagina were prepped with CholoraPrep. The patient was draped after the CholoraPrep had been allowed to dry for 3 minutes.  A Time Out was held and the above information confirmed. ? ?The urethra was prepped with Betadine. Foley catheter was placed.  A sterile speculum was placed in the vagina.  The cervix was grasped with a single-tooth tenaculum. '2mg'$  total of ICG was injected into the cervical stroma at 2 and 9 o'clock with 1cc injected at a 1cm and 14m depth (concentration 0.'5mg'$ /ml) in all locations.  OG tube placement was confirmed and to suction.  ? ?Next, a 10 mm skin incision was made 1 cm below the subcostal margin in the midclavicular line.  The 5 mm Optiview port and scope was used for direct entry.  Opening pressure was under 10 mm CO2.  The abdomen was insufflated and the findings were noted  as above.   At this point and all points during the procedure, the patient's intra-abdominal pressure did not exceed 15 mmHg. Next, an 8  mm skin incision was made superior to the umbilicus and a right and left port were placed about 8 cm lateral to the robot port on the right and left side.  A fourth arm was placed on the right.  The 5 mm assist trocar was exchanged for a 10-12 mm port. All ports were placed under direct visualization.  The patient was placed in steep Trendelenburg.   ? ?The laparoscopic Ligasure was then used to begin lysing the dense adhesions between the omentum and the anterior abdominal wall. Once dissected inferiorly sufficiently, the robot was docked in the normal manner. ? ?A combination of bipolar and monopolar electrocautery was used to continue lysing adhesions between the omentum and anterior abdominal wall until the omentum was completely freed. ? ?The pelvic anatomy was significantly distorted given adherence of the uterus to the anterior abdominal wall, obliterating the anterior cul de sac.  ? ?The right and left peritoneum were opened parallel to the IP ligament to open the retroperitoneal spaces bilaterally. The round ligaments, which were quite deviated, were transected. On the right, to help create more room in the pelvis due to the size of the uterus and its fixed location to the anterior abdominal wall, the ureter was again noted to be on the medial leaf of the broad ligament.  The peritoneum above the ureter was incised and stretched and the infundibulopelvic ligament was skeletonized, cauterized and cut. The utero-ovarian ligament was skeletonized, cauterized and transected, freeing the right adnexa. This was placed in an Endocatch bag.  ? ?The SLN mapping was performed in bilateral pelvic basins. After identifying the ureters, the para rectal and paravesical spaces were opened up entirely with careful dissection below the level of the ureters bilaterally and to the depth of the uterine artery origin in order to skeletonize the uterine "web" and ensure visualization of all parametrial channels. The para-aortic  basins were carefully exposed and evaluated for isolated para-aortic SLN's. Lymphatic channels were identified travelling to the following visualized sentinel lymph node: right obturator. This lymph node was separated from its surrounding lymphatic tissue, removed and sent for permanent pathology. The lymph node, while not grossly involved by tumor was borderline enlarged.  ? ?The hysterectomy was started.  The ureter was again noted to be on the medial leaf of the broad ligament.  ON the left, the peritoneum above the ureter was incised and stretched and the infundibulopelvic ligament was skeletonized, cauterized and cut.  ? ?Attention was then turned anteriorly, with monopolar electrocautery used to lyse very dense adhesions of the anterior uterus to the anterior abdominal wall. Once the superior aspect of the uterus was freed, attention was turned below again. During this dissection, the bladder was backfilled with 180cc of sterile fluid to help delineate the bladder location.  ? ?The cervix was dilated with Kennon Rounds dilators.  The Delineator 3.0 uterine manipulator with a colpotomizer ring was placed without difficulty.  A pneum occluder balloon was placed over the manipulator. ? ?The posterior peritoneum was taken down to the level of the KOH ring.  The anterior peritoneum was also taken down.  The bladder flap was created to the level of the KOH ring.  The uterine artery on the right side was skeletonized, cauterized and cut in the normal manner.  A similar procedure was performed on the left.  The colpotomy was  made and the uterus, cervix, left ovary and tube were amputated and placed in an Endocatch bag to be removed through a mini-lap.  Pedicles were inspected and excellent hemostasis was achieved.   ? ?The colpotomy at the vaginal cuff was closed with two 0 Vicryl on a CT1 needle in a running manner, each anchored at the apex and tied to itself in the midline.  Irrigation was used and excellent hemostasis was  achieved.   ? ?A pelvic lymph dissection was performed on the left with the following borders: proximally the bifurcation of the common iliac, distally the circumflex iliac vein, laterally the genit

## 2022-03-30 NOTE — Transfer of Care (Signed)
Immediate Anesthesia Transfer of Care Note ? ?Patient: [name] ? ?Procedure(s) Performed: XI ROBOTIC ASSISTED TOTAL HYSTERECTOMY WITH BILATERAL SALPINGO-OOPHORECTOMY, MINI LAPAROTOMY (Bilateral) ?SENTINEL NODE BIOPSY ?CYSTOSCOPY ? ?Patient Location: PACU ? ?Anesthesia Type:General ? ?Level of Consciousness: awake, drowsy and patient cooperative ? ?Airway & Oxygen Therapy: Patient Spontanous Breathing and Patient connected to face mask oxygen ? ?Post-op Assessment: Report given to RN and Post -op Vital signs reviewed and stable ? ?Post vital signs: Reviewed and stable ? ?Last Vitals:  ?Vitals Value Taken Time  ?BP 177/82 03/30/22 1639  ?Temp    ?Pulse 117 03/30/22 1642  ?Resp 19 03/30/22 1642  ?SpO2 100 % 03/30/22 1642  ?Vitals shown include unvalidated device data. ? ?Last Pain:  ?Vitals:  ? 03/30/22 1116  ?TempSrc:   ?PainSc: 0-No pain  ?   ? ?Patients Stated Pain Goal: 3 (03/30/22 1116) ? ?Complications: No notable events documented. ?

## 2022-03-30 NOTE — Brief Op Note (Signed)
03/30/2022 ? ?4:37 PM ? ?PATIENT:  [name]  61 y.o. female ? ?PRE-OPERATIVE DIAGNOSIS:  ENDOMETRIAL CANCER ? ?POST-OPERATIVE DIAGNOSIS:  ENDOMETRIAL CANCER ? ?PROCEDURE:  Procedure(s): ?XI ROBOTIC ASSISTED TOTAL HYSTERECTOMY WITH BILATERAL SALPINGO-OOPHORECTOMY, MINI LAPAROTOMY (Bilateral) ?SENTINEL NODE BIOPSY (N/A) ?CYSTOSCOPY ?Left pelvic lymph node dissection ? ?SURGEON:  Surgeon(s) and Role: ?   [name], MD - Primary ?   Lahoma Crocker, MD - Assisting ? ?EBL:  150 mL  ? ?UOP: 420 cc ? ?BLOOD ADMINISTERED:none ? ?DRAINS: none  ? ?LOCAL MEDICATIONS USED:  MARCAINE   , exparel ? ?SPECIMEN:  uterus, cervix, bilateral adnexa; right obturator SLN, left pelvic lymph nodes ? ?DISPOSITION OF SPECIMEN:  PATHOLOGY ? ?COUNTS:  YES ? ?TOURNIQUET:  * No tourniquets in log * ? ?DICTATION: .Note written in EPIC ? ?PLAN OF CARE: Admit for overnight observation ? ?PATIENT DISPOSITION:  PACU - hemodynamically stable. ?  ?Delay start of Pharmacological VTE agent (>24hrs) due to surgical blood loss or risk of bleeding: not applicable ? ?

## 2022-03-30 NOTE — Anesthesia Preprocedure Evaluation (Addendum)
Anesthesia Evaluation  ?Patient identified by MRN, date of birth, ID band ?Patient awake ? ? ? ?Reviewed: ?Allergy & Precautions, H&P , NPO status , Patient's Chart, lab work & pertinent test results ? ?Airway ?Mallampati: II ? ?TM Distance: >3 FB ?Neck ROM: Full ? ? ? Dental ?no notable dental hx. ?(+) Teeth Intact, Poor Dentition, Missing, Dental Advisory Given, Chipped ?  ?Pulmonary ?neg pulmonary ROS, former smoker,  ?  ?Pulmonary exam normal ?breath sounds clear to auscultation ? ? ? ? ? ? Cardiovascular ?Exercise Tolerance: Good ?hypertension, Pt. on medications ?Normal cardiovascular exam ?Rhythm:Regular Rate:Normal ? ?IMPRESSION: ?1. Coronary calcium score of 0. ?2. Normal coronary origin with right dominance. ?3. 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 ?4. Small PFO. ?? ?RECOMMENDATIONS: ?1. Minimal non-obstructive CAD (0-24%). Consider non-atherosclerotic ?causes of chest pain. Consider preventive therapy and risk factor ?modification. ?  ?Neuro/Psych ? Neuromuscular disease negative neurological ROS ? negative psych ROS  ? GI/Hepatic ?Neg liver ROS, GERD  Medicated,  ?Endo/Other  ?diabetes, Insulin DependentMorbid obesity ? Renal/GU ?negative Renal ROS  ?negative genitourinary ?  ?Musculoskeletal ?negative musculoskeletal ROS ?(+)  ? Abdominal ?  ?Peds ?negative pediatric ROS ?(+)  Hematology ?negative hematology ROS ?(+) Blood dyscrasia, anemia ,   ?Anesthesia Other Findings ? ? Reproductive/Obstetrics ?negative OB ROS ? ?  ? ? ? ? ? ? ? ? ? ? ? ? ? ?  ?  ? ? ? ? ? ? ?Anesthesia Physical ?Anesthesia Plan ? ?ASA: 3 ? ?Anesthesia Plan: General  ? ?Post-op Pain Management: Minimal or no pain anticipated  ? ?Induction: Intravenous ? ?PONV Risk Score and Plan: 3 and Ondansetron, Dexamethasone and Treatment may vary due to age or medical condition ? ?Airway Management Planned: Oral ETT ? ?Additional Equipment: None ? ?Intra-op Plan:  ? ?Post-operative Plan: Extubation in  OR ? ?Informed Consent: I have reviewed the patients History and Physical, chart, labs and discussed the procedure including the risks, benefits and alternatives for the proposed anesthesia with the patient or authorized representative who has indicated his/her understanding and acceptance.  ? ? ? ? ? ?Plan Discussed with: Anesthesiologist and CRNA ? ?Anesthesia Plan Comments: ( ? ?)  ? ? ? ? ? ? ?Anesthesia Quick Evaluation ? ?

## 2022-03-31 ENCOUNTER — Encounter (HOSPITAL_COMMUNITY): Payer: Self-pay | Admitting: Gynecologic Oncology

## 2022-03-31 DIAGNOSIS — C541 Malignant neoplasm of endometrium: Secondary | ICD-10-CM | POA: Diagnosis not present

## 2022-03-31 LAB — BASIC METABOLIC PANEL WITH GFR
Anion gap: 10 (ref 5–15)
BUN: 14 mg/dL (ref 6–20)
CO2: 19 mmol/L — ABNORMAL LOW (ref 22–32)
Calcium: 8.8 mg/dL — ABNORMAL LOW (ref 8.9–10.3)
Chloride: 109 mmol/L (ref 98–111)
Creatinine, Ser: 0.86 mg/dL (ref 0.44–1.00)
GFR, Estimated: >60 mL/min
Glucose, Bld: 230 mg/dL — ABNORMAL HIGH (ref 70–99)
Potassium: 4.6 mmol/L (ref 3.5–5.1)
Sodium: 138 mmol/L (ref 135–145)

## 2022-03-31 LAB — CBC
HCT: 34.7 % — ABNORMAL LOW (ref 36.0–46.0)
Hemoglobin: 11.2 g/dL — ABNORMAL LOW (ref 12.0–15.0)
MCH: 32.1 pg (ref 26.0–34.0)
MCHC: 32.3 g/dL (ref 30.0–36.0)
MCV: 99.4 fL (ref 80.0–100.0)
Platelets: 358 10*3/uL (ref 150–400)
RBC: 3.49 MIL/uL — ABNORMAL LOW (ref 3.87–5.11)
RDW: 13.1 % (ref 11.5–15.5)
WBC: 8.6 10*3/uL (ref 4.0–10.5)
nRBC: 0 % (ref 0.0–0.2)

## 2022-03-31 LAB — GLUCOSE, CAPILLARY
Glucose-Capillary: 254 mg/dL — ABNORMAL HIGH (ref 70–99)
Glucose-Capillary: 240 mg/dL — ABNORMAL HIGH (ref 70–99)

## 2022-03-31 NOTE — Progress Notes (Signed)
1 Day Post-Op Procedure(s) (LRB): ?XI ROBOTIC ASSISTED TOTAL HYSTERECTOMY WITH BILATERAL SALPINGO-OOPHORECTOMY, MINI LAPAROTOMY (Bilateral) ?SENTINEL NODE BIOPSY (N/A) ?CYSTOSCOPY ? ?Subjective: ?Patient reports doing well this am. No nausea or emesis reported. Has not had solid food but tolerating liquids. Intermittent belching with no flatus reported. Voiding without difficulty. Has ambulated to bathroom without difficulty. Pain controlled with prn medications. Family at bedside.   ? ?Objective: ?Vital signs in last 24 hours: ?Temp:  [97.5 ?F (36.4 ?C)-99.4 ?F (37.4 ?C)] 99.4 ?F (37.4 ?C) (05/04 0507) ?Pulse Rate:  [888-280] 108 (05/04 0507) ?Resp:  [10-18] 17 (05/04 0507) ?BP: (130-177)/(70-84) 144/72 (05/04 0507) ?SpO2:  [99 %-100 %] 100 % (05/04 0507) ?Last BM Date : 03/30/22 ? ?Intake/Output from previous day: ?05/03 0701 - 05/04 0700 ?In: 4047.9 [I.V.:3847.9; IV Piggyback:200] ?Out: 2170 [Urine:2020; Blood:150] ? ?Physical Examination: ?General: alert, cooperative, and no distress ?Resp: clear to auscultation bilaterally ?Cardio: regular rate and rhythm, S1, S2 normal, no murmur, click, rub or gallop ?GI: incision: mildly hypoactive bowel sounds, abdomen soft and lap sites to the abdomen with dermabond along with mini lap incision with op site dressing on all intact with no drainage present ?Extremities: extremities normal, atraumatic, no cyanosis or edema ? ?Labs: ?WBC/Hgb/Hct/Plts:  8.6/11.2/34.7/358 (05/04 0349) BUN/Cr/glu/ALT/AST/amyl/lip:  14/0.86/--/--/--/--/-- (05/04 1791) ? ?Assessment: ?61 y.o. s/p Procedure(s): ?XI ROBOTIC ASSISTED TOTAL HYSTERECTOMY WITH BILATERAL SALPINGO-OOPHORECTOMY, MINI LAPAROTOMY ?SENTINEL NODE BIOPSY ?CYSTOSCOPY: stable ?Pain:  Pain is well-controlled on PRN medications. ? ?Heme: Hgb 11.2 and Hct 34.7 this am. Appropriate compared with preop values and surgical losses. ? ?CV: BP and HR stable. Continue to monitor with ordered vital signs. ? ?GI:  Tolerating po: yes, liquids.  Antiemetics ordered as needed. ? ?GU: Voiding adequate amounts. Creatinine 0.86 this am.    ? ?FEN: No critical values this am. ? ?Endo: Diabetes mellitus Type II, under good control.  CBG: CBG (last 3)  ?Recent Labs  ?  03/30/22 ?5056 03/30/22 ?2103 03/31/22 ?0757  ?GLUCAP 154* 235* 254*  ?  ? ?Prophylaxis: SCDs on and lovenox ordered. ? ?Plan: ?Diet as tolerated. ?Encourage IS use, deep breathing, coughing, ambulation. ?Abd binder ordered. ?Plan for possible discharge later today if meeting milestones, return of bowel function (tolerating solid diet, passing flatus, pain controlled with oral pain meds). ?Continue plan of care per Dr. Berline Lopes. ? ? LOS: 0 days  ? ? ?[name] ?03/31/2022, 9:15 AM ? ? ? ?   ?

## 2022-03-31 NOTE — Discharge Summary (Signed)
?Physician Discharge Summary  ?Patient ID: ?[name] ?MRN: [id number] ?DOB/AGE: [birth year] 61 y.o. ? ?Admit date: 03/30/2022 ?Discharge date: 03/31/2022 ? ?Admission Diagnoses: Endometrial cancer ([place]) ? ?Discharge Diagnoses:  ?Principal Problem: ?  Endometrial cancer ([place]) ? ? ?Discharged Condition:  The patient is in good condition and stable for discharge.   ? ?Hospital Course: On 03/30/2022, the patient underwent the following: Procedure(s): XI ROBOTIC ASSISTED TOTAL HYSTERECTOMY WITH BILATERAL SALPINGO-OOPHORECTOMY, MINI LAPAROTOMY SENTINEL NODE BIOPSY, CYSTOSCOPY. Due to the complexity and length of surgery, she was observed overnight. POD 1 am labs appropriate compared with preop values and surgical losses. The postoperative course was uneventful.  She was discharged to home on postoperative day 1 tolerating a regular diet, ambulating, voiding, pain controlled with oral medications.  ? ?Consults: None ? ?Significant Diagnostic Studies: Am labs ? ?Treatments: surgery: see above ? ?Discharge Exam (am assessment by Dr. Berline Lopes): ?Blood pressure 135/63, pulse (!) 105, temperature 98.8 ?F (37.1 ?C), temperature source Oral, resp. rate 18, height 6' 1" (1.854 m), weight 270 lb (122.5 kg), last menstrual period 06/23/2014, SpO2 100 %. ?General appearance: alert, cooperative, and no distress ?Resp: clear to auscultation bilaterally ?Cardio: regular rate and rhythm, S1, S2 normal, no murmur, click, rub or gallop ?GI: abdomen soft, active bowel sounds ?Extremities: extremities normal, atraumatic, no cyanosis or edema ?Incision/Wound: Lap sites to the abdomen with dermabond intact with no drainage. Op site dressing in mid abdomen over mini lap incision intact with no drainage noted underneath. ? ?Disposition: Discharge disposition: 01-Home or Self Care ? ? ? ? ? ? ?Discharge Instructions   ? ? Call MD for:  difficulty breathing, headache or visual disturbances   Complete by: As directed ?  ? Call MD for:  extreme  fatigue   Complete by: As directed ?  ? Call MD for:  hives   Complete by: As directed ?  ? Call MD for:  persistant dizziness or light-headedness   Complete by: As directed ?  ? Call MD for:  persistant nausea and vomiting   Complete by: As directed ?  ? Call MD for:  redness, tenderness, or signs of infection (pain, swelling, redness, odor or green/yellow discharge around incision site)   Complete by: As directed ?  ? Call MD for:  severe uncontrolled pain   Complete by: As directed ?  ? Call MD for:  temperature >100.4   Complete by: As directed ?  ? Diet - low sodium heart healthy   Complete by: As directed ?  ? Discharge wound care:   Complete by: As directed ?  ? You will have a white honeycomb dressing over your larger incision. This dressing can be removed 5 days after surgery and you do not need to reapply a new dressing. Once you remove the dressing, you will notice that you have the surgical glue (dermabond) on the incision and this will peel off on its own. You can get this dressing wet in the shower the days after surgery prior to removal on the 5th day.  ? Driving Restrictions   Complete by: As directed ?  ? No driving for around 1 week(s).  Do not take narcotics and drive. You need to make sure your reaction time has returned.  ? Increase activity slowly   Complete by: As directed ?  ? Lifting restrictions   Complete by: As directed ?  ? No lifting greater than 10 lbs, pushing, pulling, straining for 6 weeks.  ? Sexual Activity Restrictions  Complete by: As directed ?  ? No sexual activity, nothing in the vagina, for 8 weeks.  ? ?  ? ?Allergies as of 03/31/2022   ? ?   Reactions  ? Nsaids Nausea And Vomiting  ? Acid reflux  ? Penicillins Other (See Comments)  ? Unknown ?Did it involve swelling of the face/tongue/throat, SOB, or low BP? Unk ?Did it involve sudden or severe rash/hives, skin peeling, or any reaction on the inside of your mouth or nose? Unk  ?Did you need to seek medical attention at a  hospital or doctor's office? Unk ?When did it last happen? Childhood      ?If all above answers are "NO", may proceed with cephalosporin use.  ? ?  ? ?  ?Medication List  ?  ? ?STOP taking these medications   ? ?megestrol 40 MG tablet ?Commonly known as: MEGACE ?  ? ?  ? ?TAKE these medications   ? ?Accu-Chek Guide test strip ?Generic drug: glucose blood ?USE UP TO FOUR TIMES DAILY AS DIRECTED ?  ?acetaminophen 650 MG CR tablet ?Commonly known as: TYLENOL ?Take 1,300-1,950 mg by mouth every 8 (eight) hours as needed for pain. ?  ?atorvastatin 80 MG tablet ?Commonly known as: Lipitor ?Take 1 tablet (80 mg total) by mouth daily. ?  ?blood glucose meter kit and supplies ?Dispense based on patient and insurance preference. Use up to four times daily as directed. (FOR ICD-10 E10.9, E11.9). ?  ?blood glucose meter kit and supplies Kit ?Dispense based on patient and insurance preference. Use up to four times daily as directed. (FOR ICD-9 250.00, 250.01). ?  ?Dexcom G6 Receiver Kerrin Mo ?Check blood sugar at least 8 times a day ?  ?Dexcom G6 Sensor Misc ?Check blood sugar at least 8 times a day ?  ?Dexcom G6 Transmitter Misc ?Check blood sugar at least 8 times a day ?  ?diphenhydrAMINE HCl (Sleep) 50 MG Caps ?Take 150 mg by mouth at bedtime. ?  ?gabapentin 300 MG capsule ?Commonly known as: NEURONTIN ?Take 3 capsules (900 mg total) by mouth 3 (three) times daily. ?  ?insulin lispro 100 UNIT/ML KwikPen ?Commonly known as: HUMALOG ?INJECT 20-22 UNITS SUBCUTANEOUSLY THREE TIMES DAILY WITH MEALS ?What changed:  ?how much to take ?how to take this ?when to take this ?additional instructions ?  ?Insulin Pen Needle 31G X 5 MM Misc ?Use as directed with Lantus and Humalog ?  ?Lantus SoloStar 100 UNIT/ML Solostar Pen ?Generic drug: insulin glargine ?Inject 80 Units into the skin at bedtime. ?  ?lisinopril 20 MG tablet ?Commonly known as: ZESTRIL ?Take 1 tablet (20 mg total) by mouth daily. ?  ?meclizine 25 MG tablet ?Commonly known as:  ANTIVERT ?Take 1 tablet (25 mg total) by mouth 3 (three) times daily as needed for dizziness. ?  ?metFORMIN 500 MG 24 hr tablet ?Commonly known as: GLUCOPHAGE-XR ?Take 2 tablets (1,000 mg total) by mouth 2 (two) times daily with a meal. ?What changed:  ?how much to take ?when to take this ?  ?multivitamin with minerals Tabs tablet ?Take 1 tablet by mouth every evening. ?  ?omeprazole 20 MG capsule ?Commonly known as: PRILOSEC ?Take 1 capsule (20 mg total) by mouth daily. ?What changed:  ?when to take this ?reasons to take this ?  ?OneTouch Delica Plus [id number] Misc ?Check blood sugar 3 times a day ?  ?Semaglutide (2 MG/DOSE) 8 MG/3ML Sopn ?Inject 2 mg into the skin once a week. ?  ?senna-docusate 8.6-50 MG tablet ?Commonly known  as: Senokot-S ?Take 2 tablets by mouth at bedtime. For AFTER surgery, do not take if having diarrhea ?  ?traMADol 50 MG tablet ?Commonly known as: ULTRAM ?Take 1 tablet (50 mg total) by mouth every 6 (six) hours as needed for severe pain. For AFTER surgery only, do not take and drive ?  ? ?  ? ?  ?  ? ? ?  ?Discharge Care Instructions  ?(From admission, onward)  ?  ? ? ?  ? ?  Start     Ordered  ? 03/31/22 0000  Discharge wound care:       ?Comments: You will have a white honeycomb dressing over your larger incision. This dressing can be removed 5 days after surgery and you do not need to reapply a new dressing. Once you remove the dressing, you will notice that you have the surgical glue (dermabond) on the incision and this will peel off on its own. You can get this dressing wet in the shower the days after surgery prior to removal on the 5th day.  ? 03/31/22 1451  ? ?  ?  ? ?  ? ? Follow-up Information   ? ? [name], MD Follow up on 04/05/2022.   ?Specialty: Gynecologic Oncology ?Why: at 4:45pm will be a PHONE call with Dr. Berline Lopes to discuss pathology and check in. IN PERSON visit will be on 04/22/22 at 12:45pm at the [facility]. ?Contact information: ?[address] ?[place] [address] ?[phone] ? ? ?  ?  ? ?  ?  ? ?  ? ? ?Greater than thirty minutes were spend for face to face discharge instructions and discharge orders/summary in EPIC.  ? ?Signed: ?Melissa D Cross ?5/4

## 2022-03-31 NOTE — Discharge Instructions (Addendum)
AFTER SURGERY INSTRUCTIONS ?  ?Return to work: 4-6 weeks if applicable ? ?You will have a white honeycomb dressing over your larger incision. This dressing can be removed 5 days after surgery and you do not need to reapply a new dressing. Once you remove the dressing, you will notice that you have the surgical glue (dermabond) on the incision and this will peel off on its own. You can get this dressing wet in the shower the days after surgery prior to removal on the 5th day.  ?  ?Activity: ?1. Be up and out of the bed during the day.  Take a nap if needed.  You may walk up steps but be careful and use the hand rail.  Stair climbing will tire you more than you think, you may need to stop part way and rest.  ?  ?2. No lifting or straining for 6 weeks over 10 pounds. No pushing, pulling, straining for 6 weeks. ?  ?3. No driving for around 1 week(s).  Do not drive if you are taking narcotic pain medicine and make sure that your reaction time has returned.  ?  ?4. You can shower as soon as the next day after surgery. Shower daily.  Use your regular soap and water (not directly on the incision) and pat your incision(s) dry afterwards; don't rub.  No tub baths or submerging your body in water until cleared by your surgeon. If you have the soap that was given to you by pre-surgical testing that was used before surgery, you do not need to use it afterwards because this can irritate your incisions.  ?  ?5. No sexual activity and nothing in the vagina for 8 weeks. ?  ?6. You may experience a small amount of clear drainage from your incisions, which is normal.  If the drainage persists, increases, or changes color please call the office. ?  ?7. Do not use creams, lotions, or ointments such as neosporin on your incisions after surgery until advised by your surgeon because they can cause removal of the dermabond glue on your incisions.   ?  ?8. You may experience vaginal spotting after surgery or around the 6-8 week mark from  surgery when the stitches at the top of the vagina begin to dissolve.  The spotting is normal but if you experience heavy bleeding, call our office. ?  ?9. Take Tylenol for pain and only use narcotic pain medication for severe pain not relieved by the Tylenol.  Monitor your Tylenol intake to a max of 4,000 mg in a 24 hour period.  ?  ?Diet: ?1. Low sodium Heart Healthy Diet is recommended but you are cleared to resume your normal (before surgery) diet after your procedure. ?  ?2. It is safe to use a laxative, such as Miralax or Colace, if you have difficulty moving your bowels. You have been prescribed Sennakot-S to take at bedtime every evening after surgery to keep bowel movements regular and to prevent constipation.   ?  ?Wound Care: ?1. Keep clean and dry.  Shower daily. ?  ?Reasons to call the Doctor: ?Fever - Oral temperature greater than 100.4 degrees Fahrenheit ?Foul-smelling vaginal discharge ?Difficulty urinating ?Nausea and vomiting ?Increased pain at the site of the incision that is unrelieved with pain medicine. ?Difficulty breathing with or without chest pain ?New calf pain especially if only on one side ?Sudden, continuing increased vaginal bleeding with or without clots. ?  ?Contacts: ?For questions or concerns you should contact: ?  ?  Dr. Jeral Pinch at [phone] ?  ?Joylene John, NP at [phone] ?  ?After Hours: call [phone] and have the GYN Oncologist paged/contacted (after 5 pm or on the weekends). ?  ?Messages sent via mychart are for non-urgent matters and are not responded to after hours so for urgent needs, please call the after hours number. ?

## 2022-03-31 NOTE — Progress Notes (Signed)
Transition of Care (TOC) Screening Note ? ?Patient Details  ?Name: [name] ?Date of Birth: [birth year] ? ?Transition of Care (TOC) CM/SW Contact:    ?Sherie Don, LCSW ?Phone Number: ?03/31/2022, 11:16 AM ? ?Transition of Care Department [facility]) has reviewed patient and no TOC needs have been identified at this time. We will continue to monitor patient advancement through interdisciplinary progression rounds. If new patient transition needs arise, please place a TOC consult. ?

## 2022-03-31 NOTE — Progress Notes (Signed)
GYN Oncology Progress Note ? ?Called to check in with the patient. She states she is tolerating diet, voiding, ambulating, pain controlled with tramadol and tylenol. She says she has her clothes on and she is ready to go home. No flatus but states she feels her tummy bubbling and she thinks it will come any time. No nausea or emesis reported. ?

## 2022-04-01 ENCOUNTER — Telehealth: Payer: Self-pay | Admitting: Oncology

## 2022-04-01 ENCOUNTER — Telehealth: Payer: Self-pay | Admitting: *Deleted

## 2022-04-01 NOTE — Telephone Encounter (Signed)
Spoke with Ms. Franzoni this morning. She states she is eating, drinking and urinating well. She has not had a BM yet but is passing gas. She is taking senokot as prescribed and encouraged her to drink plenty of water. She denies fever or chills. Incisions are dry and intact. She rates her pain 5/10. Her pain is controlled with tylenol and Tramadol.   ? ?Instructed to call office with any fever, chills, purulent drainage, uncontrolled pain or any other questions or concerns. Patient verbalizes understanding.  ? ?Pt aware of post op appointments as well as the office number [phone] and after hours number [phone] to call if she has any questions or concerns  ?

## 2022-04-01 NOTE — Telephone Encounter (Signed)
Called [name] and scheduled new patient appointment with Dr. Alvy Bimler on 04/08/22 at 1:00 with 12:30 arrival.  She verbalized understanding and agreement of appointment date and time. ?

## 2022-04-04 ENCOUNTER — Encounter: Payer: Medicaid Other | Admitting: Gynecologic Oncology

## 2022-04-05 ENCOUNTER — Inpatient Hospital Stay (HOSPITAL_BASED_OUTPATIENT_CLINIC_OR_DEPARTMENT_OTHER): Payer: Medicaid Other | Admitting: Gynecologic Oncology

## 2022-04-05 DIAGNOSIS — Z90722 Acquired absence of ovaries, bilateral: Secondary | ICD-10-CM

## 2022-04-05 DIAGNOSIS — Z90710 Acquired absence of both cervix and uterus: Secondary | ICD-10-CM

## 2022-04-05 DIAGNOSIS — C541 Malignant neoplasm of endometrium: Secondary | ICD-10-CM

## 2022-04-05 NOTE — Progress Notes (Signed)
Gynecologic Oncology Telehealth Consult Note: Gyn-Onc ? ?I connected with [name] on 04/05/22 at  4:45 PM EDT by telephone and verified that I am speaking with the correct person using two identifiers. ? ?I discussed the limitations, risks, security and privacy concerns of performing an evaluation and management service by telemedicine and the availability of in-person appointments. I also discussed with the patient that there may be a patient responsible charge related to this service. The patient expressed understanding and agreed to proceed. ? ?Other persons participating in the visit and their role in the encounter: none. ? ?Patient's location: home ?Provider's location: [facility] ? ?Reason for Visit: follow-up after surgery, treatment discussion ? 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 EMB: MMMT/carcinosarcoma (predominance of sarcomatous component) ?  ?03/08/2022 Initial Diagnosis  ? 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[facility]) ?  ?03/30/2022 Surgery  ? TRH/BSO, SLN biopsy on right, left pelvic LND, LOS, mini-lap ? ?Findings: On EUA, 12cm minimally mobile uterus. On intra-abdominal exam, normal upper abdominal survey. Omentum adherent to the anterior abdominal wall along the prior midline incision. Normal omentum otherwise, normal small and large bowel. 12 cm uterus densely adherent to the anterior abdominal wall, obliterating some of the anterior anatomy including the anterior cul de sac. Normal appearing bilateral adnexa. Mapping successful to right obturator SLN, mildly enlarged. Dye seen within the parametrium on the left, no SLNs identified. No obvious adenopathy on the left. Decision made given length of surgery, comorbitidies to defer left para-aortic lymphadenectomy. Mini-lap required for specimen delivery. ?Dome intact on cystoscopy and good efflux noted from bilateral ureteral orifices. ?  ?03/30/2022 Pathology Results  ? MMMT/carcinosarcoma,  6.3 cm ?MI 1cm or 3 (<50%) ?Cervical stroma, bilateral tubes/ovaries benign ?R SLN and L pelvic LNDs negative ? ?ONCOLOGY TABLE:  ? ?UTERUS, CARCINOMA OR CARCINOSARCOMA: Resection  ? ?Procedure: Total hysterectomy and bilateral salpingo-oophorectomy  ?Histologic Type:  Malignant mixed Mullerian tumor (MMMT/ carcinosarcoma)  ?Histologic Grade: High-grade  ?Myometrial Invasion:  ?     Depth of Myometrial Invasion (mm): 10 mm  ?     Myometrial Thickness (mm): 30 mm  ?     Percentage of Myometrial Invasion: 33%  ?Uterine Serosa Involvement: Not identified  ?Cervical stromal Involvement: Not identified  ?Extent of involvement of other tissue/organs: Not identified  ?Peritoneal/Ascitic Fluid: Not applicable  ?Lymphovascular Invasion: Not identified  ?Regional Lymph Nodes:  ?     Pelvic Lymph Nodes Examined:  ?                                 1 Sentinel  ?                                 6 Non-sentinel  ?                                 7 Total  ?     Pelvic Lymph Nodes with Metastasis: 0  ?                         Macrometastasis: (>2.0 mm): 0  ?                         Micrometastasis: (>  0.2 mm and < 2.0 mm): 0  ?                         Isolated Tumor Cells (<0.2 mm): 0  ?                         Laterality of Lymph Node with Tumor: Not  ?applicable  ?                         Extracapsular Extension: Not applicable  ?     Para-aortic Lymph Nodes Examined:  ?                                  0 Sentinel  ?                                  0 non-sentinel  ?                                  0 total  ?Distant Metastasis:  ?     Distant Site(s) Involved: Not applicable  ?Pathologic Stage Classification (pTNM, AJCC 8th Edition): pT1a, pN0  ?Ancillary Studies: MMR / MSI testing will be ordered  ?Representative Tumor Block: B1  ?Comment(s): Pancytokeratin was performed on the lymph nodes and is  ?negative.  ?  ? ? ?Interval History: ?Pain improved. Uses tylenol as needed. ?Endorses good bowel function. ?Denies urinary  symptoms. ?No vaginal bleeding. Small spotting from several incisions. ?Incisions decreased but thinks related Ozempic.  ? ?Past Medical/Surgical History: ?Past Medical History:  ?Diagnosis Date  ? Anemia   ? Asthma 11/01/2010  ? only when respiratory infections  ? BPPV (benign paroxysmal positional vertigo) 03/07/2013  ? Diabetes mellitus   ? since 1983  ? Elevated TSH 05/13/2013  ? Endometrial cancer ([place])   ? GERD (gastroesophageal reflux disease)   ? Hypertension   ? Left elbow pain 05/05/2016  ? Left shoulder pain 05/05/2016  ? Osteomyelitis ([place])   ? Pneumonia   ? as a child  ? Wrist pain 04/11/2013  ? ? ?Past Surgical History:  ?Procedure Laterality Date  ? ACHILLES TENDON LENGTHENING Left 08/29/2014  ? Procedure: Left Achilles Lengthening;  Surgeon: Newt Minion, MD;  Location: [place];  Service: Orthopedics;  Laterality: Left;  ? AMPUTATION Left 02/19/2013  ? Procedure: Amputation of Left Great Toe;  Surgeon: Mcarthur Rossetti, MD;  Location: [place];  Service: Orthopedics;  Laterality: Left;  ? AMPUTATION Left 08/29/2014  ? Procedure: Left Foot 2nd and 1st Toe Amputation;  Surgeon: Newt Minion, MD;  Location: [place];  Service: Orthopedics;  Laterality: Left;  ? CARDIAC CATHETERIZATION    ? 15 years ago  ? CESAREAN SECTION    ? x 4  ? CYSTOSCOPY  03/30/2022  ? Procedure: CYSTOSCOPY;  Surgeon: [name], MD;  Location: [facility];  Service: Gynecology;;  ? ROBOTIC ASSISTED TOTAL HYSTERECTOMY WITH BILATERAL SALPINGO OOPHERECTOMY Bilateral 03/30/2022  ? Procedure: XI ROBOTIC ASSISTED TOTAL HYSTERECTOMY WITH BILATERAL SALPINGO-OOPHORECTOMY, MINI LAPAROTOMY;  Surgeon: [name], MD;  Location: [facility];  Service: Gynecology;  Laterality: Bilateral;  ? SENTINEL NODE BIOPSY N/A 03/30/2022  ? Procedure:  SENTINEL NODE BIOPSY;  Surgeon: [name], MD;  Location: [facility];  Service: Gynecology;  Laterality: N/A;  ? TUBAL LIGATION    ? at one of c-sections  ? ? ?Family History  ?Problem Relation Age  of Onset  ? Hypertension Mother   ? Dementia Mother   ? Diabetes Father   ? Hypertension Father   ? Diabetes Daughter   ? Colon cancer Neg Hx   ? Breast cancer Neg Hx   ? Ovarian cancer Neg Hx   ? Endometrial cancer Neg Hx   ? Pancreatic cancer Neg Hx   ? Prostate cancer Neg Hx   ? ? ?Social History  ? ?Socioeconomic History  ? Marital status: Divorced  ?  Spouse name: Not on file  ? Number of children: 3  ? Years of education: Not on file  ? Highest education level: Not on file  ?Occupational History  ? Not on file  ?Tobacco Use  ? Smoking status: Former  ?  Types: Cigarettes  ?  Quit date: 11/28/1962  ?  Years since quitting: 59.3  ? Smokeless tobacco: Never  ?Vaping Use  ? Vaping Use: Never used  ?Substance and Sexual Activity  ? Alcohol use: No  ?  Alcohol/week: 0.0 standard drinks  ? Drug use: No  ? Sexual activity: Not Currently  ?Other Topics Concern  ? Not on file  ?Social History Narrative  ? Not on file  ? ?Social Determinants of Health  ? ?Financial Resource Strain: Not on file  ?Food Insecurity: Not on file  ?Transportation Needs: Not on file  ?Physical Activity: Not on file  ?Stress: Not on file  ?Social Connections: Not on file  ? ? ?Current Medications: ? ?Current Outpatient Medications:  ?  ACCU-CHEK GUIDE test strip, USE UP TO FOUR TIMES DAILY AS DIRECTED, Disp: 100 each, Rfl: 3 ?  acetaminophen (TYLENOL) 650 MG CR tablet, Take 1,300-1,950 mg by mouth every 8 (eight) hours as needed for pain., Disp: , Rfl:  ?  atorvastatin (LIPITOR) 80 MG tablet, Take 1 tablet (80 mg total) by mouth daily., Disp: 90 tablet, Rfl: 3 ?  blood glucose meter kit and supplies KIT, Dispense based on patient and insurance preference. Use up to four times daily as directed. (FOR ICD-9 250.00, 250.01)., Disp: 1 each, Rfl: 0 ?  blood glucose meter kit and supplies, Dispense based on patient and insurance preference. Use up to four times daily as directed. (FOR ICD-10 E10.9, E11.9)., Disp: 1 each, Rfl: 0 ?  Continuous Blood Gluc  Receiver (DEXCOM G6 RECEIVER) DEVI, Check blood sugar at least 8 times a day, Disp: 1 each, Rfl: 0 ?  Continuous Blood Gluc Sensor (DEXCOM G6 SENSOR) MISC, Check blood sugar at least 8 times a day, Disp: 9 each,

## 2022-04-06 LAB — SURGICAL PATHOLOGY

## 2022-04-08 ENCOUNTER — Encounter: Payer: Self-pay | Admitting: Hematology and Oncology

## 2022-04-08 ENCOUNTER — Other Ambulatory Visit: Payer: Self-pay

## 2022-04-08 ENCOUNTER — Encounter: Payer: Self-pay | Admitting: Oncology

## 2022-04-08 ENCOUNTER — Inpatient Hospital Stay (HOSPITAL_BASED_OUTPATIENT_CLINIC_OR_DEPARTMENT_OTHER): Payer: Medicaid Other | Admitting: Hematology and Oncology

## 2022-04-08 VITALS — BP 143/75 | HR 82 | Temp 98.7°F | Resp 18 | Ht 73.0 in | Wt 270.7 lb

## 2022-04-08 DIAGNOSIS — E66812 Obesity, class 2: Secondary | ICD-10-CM

## 2022-04-08 DIAGNOSIS — E1142 Type 2 diabetes mellitus with diabetic polyneuropathy: Secondary | ICD-10-CM | POA: Diagnosis not present

## 2022-04-08 DIAGNOSIS — E1165 Type 2 diabetes mellitus with hyperglycemia: Secondary | ICD-10-CM

## 2022-04-08 DIAGNOSIS — E669 Obesity, unspecified: Secondary | ICD-10-CM | POA: Diagnosis not present

## 2022-04-08 DIAGNOSIS — Z6835 Body mass index (BMI) 35.0-35.9, adult: Secondary | ICD-10-CM

## 2022-04-08 DIAGNOSIS — C541 Malignant neoplasm of endometrium: Secondary | ICD-10-CM | POA: Diagnosis present

## 2022-04-08 DIAGNOSIS — Z794 Long term (current) use of insulin: Secondary | ICD-10-CM | POA: Diagnosis not present

## 2022-04-08 DIAGNOSIS — E1342 Other specified diabetes mellitus with diabetic polyneuropathy: Secondary | ICD-10-CM | POA: Diagnosis not present

## 2022-04-08 DIAGNOSIS — E1340 Other specified diabetes mellitus with diabetic neuropathy, unspecified: Secondary | ICD-10-CM | POA: Insufficient documentation

## 2022-04-08 MED ORDER — ONDANSETRON HCL 8 MG PO TABS: 8.0000 mg | ORAL_TABLET | Freq: Two times a day (BID) | ORAL | 1 refills | Status: DC | PRN

## 2022-04-08 MED ORDER — PROCHLORPERAZINE MALEATE 10 MG PO TABS: 10.0000 mg | ORAL_TABLET | Freq: Four times a day (QID) | ORAL | 1 refills | Status: DC | PRN

## 2022-04-08 MED ORDER — DEXAMETHASONE 4 MG PO TABS: ORAL_TABLET | ORAL | 6 refills | Status: DC

## 2022-04-08 MED ORDER — LIDOCAINE-PRILOCAINE 2.5-2.5 % EX CREA: TOPICAL_CREAM | CUTANEOUS | 3 refills | Status: DC

## 2022-04-08 NOTE — Assessment & Plan Note (Signed)
Due to her body weight, the calculated dose of chemotherapy comes back very high ?I plan to use carboplatin AUC of 5 ?

## 2022-04-08 NOTE — Progress Notes (Signed)
[place] ?CONSULT NOTE ? ?Patient Care Team: ?Lottie Mussel, MD as PCP - General (Internal Medicine) ?Geralynn Rile, MD as PCP - Cardiology (Internal Medicine) ? 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 The intent is of curative intent. ? ?We discussed some of the risks, benefits, side-effects of carboplatin & Taxol. Treatment is intravenous, every 3 weeks x 6 cycles ? ?Some of the short term side-effects included, though not limited to, including weight loss, life threatening infections, risk of allergic reactions, need for transfusions of blood products, nausea, vomiting, change in bowel habits, loss of hair, admission to hospital for various reasons, and risks of death.  ? ?Long term side-effects are also discussed including risks of infertility, permanent damage to nerve function, hearing loss, chronic fatigue, kidney damage with possibility needing hemodialysis, and rare secondary malignancy including bone marrow disorders. ? ?The patient is aware that the response rates discussed earlier is not guaranteed.  After a long discussion, patient made an informed decision to proceed with the prescribed plan of care.  ? ?Patient education material was dispensed. ?We discussed premedication with dexamethasone before chemotherapy. ? ? ?Obesity, Class II, BMI 35-39.9 ?Due to her body weight, the calculated dose of chemotherapy comes back very high ?I plan to use carboplatin AUC of 5 ? ?Uncontrolled diabetes mellitus with hyperglycemia ([place]) ?She would be at high risk for severe uncontrolled hyperglycemia while on treatment ?The patient appears highly motivated to change her diet ?She will continue medical management and will likely need additional insulin doses while on treatment ? 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 ?Orders Placed This Encounter  ?Procedures  ? IR IMAGING GUIDED PORT INSERTION  ?  Standing Status:   Future  ?  Standing Expiration Date:   04/09/2023  ?  Order Specific Question:   Reason for Exam (SYMPTOM  OR DIAGNOSIS REQUIRED)  ?  Answer:   need port fror chemo  ?  Order Specific Question:   Is the patient pregnant?  ?  Answer:   No  ?  Order Specific Question:   Preferred Imaging Location?  ?  Answer:   [facility]  ? CBC with Differential ([place] Only)  ?  Standing Status:   Standing  ?  Number of Occurrences:   20  ?  Standing Expiration Date:   04/09/2023  ? CMP ([place] only)  ?  Standing Status:   Standing  ?  Number of Occurrences:   20  ?  Standing Expiration Date:   04/09/2023  ? ? ?The total time spent in the appointment was 80 minutes encounter with patients including review of chart and various tests results, discussions about plan of care and coordination of care plan ? ? All questions were answered. The patient knows to call the clinic with any problems, questions or concerns. No barriers to learning was detected. ? ?Heath Lark, MD ?5/12/20233:56 PM ? ?CHIEF COMPLAINTS/PURPOSE OF CONSULTATION:  ?Uterine carcinosarcoma, for adjuvant treatment ? ?HISTORY OF PRESENTING ILLNESS:  ?[name] 61 y.o. 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 ?I have reviewed  her chart and materials related to her cancer extensively and collaborated history with the patient. Summary of oncologic history is as follows: ?Oncology History Overview Note  ?P53 wild type ?  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 She presented with postmenopausal bleeding ?  ?02/07/2022 Imaging  ? US pelvis ?Thickened heterogeneous endometrium with masslike region with internal vascularity. Findings may be associated with endometrial carcinoma or hyperplasia. Biopsy is recommended for further evaluation. ?  ?02/22/2022 Initial Biopsy  ? EMB: MMMT/carcinosarcoma (predominance of sarcomatous component) ?  ?03/05/2022 Imaging  ? Ct chest, abdomen and pelvis ?1. Heterogeneous enlargement of the endometrial cavity is compatible with the reported history of uterine carcinosarcoma. No definite involvement of the posterior bladder wall or sigmoid colon/rectum. ?2. 9 mm short axis right external iliac node is upper normal for size. Attention on follow-up recommended. Otherwise no lymphadenopathy in the abdomen or pelvis. ?3. 3.6 x 2.7 cm lesion in the lateral segment left liver has subtle peripheral nodular enhancement. This is almost certainly a benign cavernous hemangioma, but MRI abdomen with and without contrast recommended to confirm. ?4. Aortic Atherosclerosis (ICD10-I70.0). ?  ?03/08/2022 Initial Diagnosis  ? 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[facility]) ?  ?03/30/2022 Surgery  ? TRH/BSO, SLN biopsy on right, left pelvic LND, LOS, mini-lap ? ?Findings: On EUA, 12cm minimally mobile uterus. On intra-abdominal exam, normal upper abdominal survey. Omentum adherent to the anterior abdominal wall along the prior midline incision. Normal omentum otherwise, normal small and large bowel. 12 cm uterus densely adherent to the anterior abdominal wall, obliterating some of the anterior anatomy including the anterior cul de sac. Normal appearing bilateral adnexa. Mapping successful to right obturator SLN, mildly  enlarged. Dye seen within the parametrium on the left, no SLNs identified. No obvious adenopathy on the left. Decision made given length of surgery, comorbitidies to defer left para-aortic lymphadenectomy. Mini-lap required for specimen delivery. ?Dome intact on cystoscopy and good efflux noted from bilateral ureteral orifices. ?  ?03/30/2022 Pathology Results  ? MMMT/carcinosarcoma, 6.3 cm ?MI 1cm or 3 (<50%) ?Cervical stroma, bilateral tubes/ovaries benign ?R SLN and L pelvic LNDs negative ? ?ONCOLOGY TABLE:  ? ?UTERUS, CARCINOMA OR CARCINOSARCOMA: Resection  ? ?Procedure: Total hysterectomy and bilateral salpingo-oophorectomy  ?Histologic Type:  Malignant mixed Mullerian tumor (MMMT/ carcinosarcoma)  ?Histologic Grade: High-grade  ?Myometrial Invasion:  ?     Depth of Myometrial Invasion (mm): 10 mm  ?     Myometrial Thickness (mm): 30 mm  ?     Percentage of Myometrial Invasion: 33%  ?Uterine Serosa Involvement: Not identified  ?Cervical stromal Involvement: Not identified  ?Extent of involvement of other tissue/organs: Not identified  ?Peritoneal/Ascitic Fluid: Not applicable  ?Lymphovascular Invasion: Not identified  ?Regional Lymph Nodes:  ?     Pelvic Lymph Nodes Examined:  ?                                 1 Sentinel  ?  6 Non-sentinel  ?                                 7 Total  ?     Pelvic Lymph Nodes with Metastasis: 0  ?                         Macrometastasis: (>2.0 mm): 0  ?                         Micrometastasis: (>0.2 mm and < 2.0 mm): 0  ?                         Isolated Tumor Cells (<0.2 mm): 0  ?                         Laterality of Lymph Node with Tumor: Not  ?applicable  ?                         Extracapsular Extension: Not applicable  ?     Para-aortic Lymph Nodes Examined:  ?                                  0 Sentinel  ?                                  0 non-sentinel  ?                                  0 total  ?Distant Metastasis:  ?     Distant Site(s)  Involved: Not applicable  ?Pathologic Stage Classification (pTNM, AJCC 8th Edition): pT1a, pN0  ?Ancillary Studies: MMR / MSI testing will be ordered  ?Representative Tumor Block: B1  ?Comment(s): Pancytoker

## 2022-04-08 NOTE — Assessment & Plan Note (Signed)
She would be at high risk for severe uncontrolled hyperglycemia while on treatment ?The patient appears highly motivated to change her diet ?She will continue medical management and will likely need additional insulin doses while on treatment ?

## 2022-04-08 NOTE — Progress Notes (Signed)
START ON PATHWAY REGIMEN - Uterine     A cycle is every 21 days:     Paclitaxel      Carboplatin   **Always confirm dose/schedule in your pharmacy ordering system**  Patient Characteristics: Carcinosarcoma, Newly Diagnosed, Postoperative (Pathologic Staging), Postoperative Histology: Carcinosarcoma Therapeutic Status: Newly Diagnosed, Postoperative (Pathologic Staging) AJCC M Category: cM0 AJCC 8 Stage Grouping: I AJCC T Category: pT1 AJCC N Category: pN0 Intent of Therapy: Curative Intent, Discussed with Patient 

## 2022-04-08 NOTE — Progress Notes (Signed)
Met with [name] and provided her with the [place] folder and encouraged her to call with any questions or needs. ?

## 2022-04-08 NOTE — Assessment & Plan Note (Signed)
We reviewed the NCCN guidelines We discussed the role of chemotherapy. The intent is of curative intent.  We discussed some of the risks, benefits, side-effects of carboplatin & Taxol. Treatment is intravenous, every 3 weeks x 6 cycles  Some of the short term side-effects included, though not limited to, including weight loss, life threatening infections, risk of allergic reactions, need for transfusions of blood products, nausea, vomiting, change in bowel habits, loss of hair, admission to hospital for various reasons, and risks of death.   Long term side-effects are also discussed including risks of infertility, permanent damage to nerve function, hearing loss, chronic fatigue, kidney damage with possibility needing hemodialysis, and rare secondary malignancy including bone marrow disorders.  The patient is aware that the response rates discussed earlier is not guaranteed.  After a long discussion, patient made an informed decision to proceed with the prescribed plan of care.   Patient education material was dispensed. We discussed premedication with dexamethasone before chemotherapy.  

## 2022-04-08 NOTE — Assessment & Plan Note (Signed)
She has pre-existing peripheral neuropathy due to diabetes ?I recommend upfront dose reduction of paclitaxel ?

## 2022-04-14 ENCOUNTER — Other Ambulatory Visit (HOSPITAL_COMMUNITY): Payer: Medicaid Other

## 2022-04-14 ENCOUNTER — Encounter: Payer: Self-pay | Admitting: Gynecologic Oncology

## 2022-04-18 ENCOUNTER — Other Ambulatory Visit: Payer: Self-pay | Admitting: Radiology

## 2022-04-18 NOTE — H&P (Signed)
Chief Complaint: Patient was seen in consultation today for tunneled catheter with port placement at the request of [place]  Referring Physician(s): Heath Lark  Supervising Physician: Aletta Edouard  Patient Status: [facility] - Out-pt  History of Present Illness: [name] is a 61 y.o. female with past medical history significant for anemia, benign paroxysmal positional vertigo, DM type II, HTN and endometrial cancer.  Patient referred by Dr. Heath Lark for tunneled catheter with port placement to begin chemotherapy for uterine carcinosarcoma.  Past Medical History:  Diagnosis Date   Anemia    Asthma 11/01/2010   only when respiratory infections   BPPV (benign paroxysmal positional vertigo) 03/07/2013   Diabetes mellitus    since 1983   Elevated TSH 05/13/2013   Endometrial cancer ([place])    GERD (gastroesophageal reflux disease)    Hypertension    Left elbow pain 05/05/2016   Left shoulder pain 05/05/2016   Osteomyelitis ([place])    Pneumonia    as a child   Wrist pain 04/11/2013    Past Surgical History:  Procedure Laterality Date   ACHILLES TENDON LENGTHENING Left 08/29/2014   Procedure: Left Achilles Lengthening;  Surgeon: Newt Minion, MD;  Location: [place];  Service: Orthopedics;  Laterality: Left;   AMPUTATION Left 02/19/2013   Procedure: Amputation of Left Great Toe;  Surgeon: Mcarthur Rossetti, MD;  Location: [place];  Service: Orthopedics;  Laterality: Left;   AMPUTATION Left 08/29/2014   Procedure: Left Foot 2nd and 1st Toe Amputation;  Surgeon: Newt Minion, MD;  Location: [place];  Service: Orthopedics;  Laterality: Left;   CARDIAC CATHETERIZATION     15 years ago   CESAREAN SECTION     x 4   CYSTOSCOPY  03/30/2022   Procedure: CYSTOSCOPY;  Surgeon: [name], MD;  Location: [facility];  Service: Gynecology;;   ROBOTIC ASSISTED TOTAL HYSTERECTOMY WITH BILATERAL SALPINGO OOPHERECTOMY Bilateral 03/30/2022   Procedure: XI ROBOTIC ASSISTED TOTAL  HYSTERECTOMY WITH BILATERAL SALPINGO-OOPHORECTOMY, MINI LAPAROTOMY;  Surgeon: [name], MD;  Location: [facility];  Service: Gynecology;  Laterality: Bilateral;   SENTINEL NODE BIOPSY N/A 03/30/2022   Procedure: SENTINEL NODE BIOPSY;  Surgeon: [name], MD;  Location: [facility];  Service: Gynecology;  Laterality: N/A;   TUBAL LIGATION     at one of c-sections    Allergies: Nsaids and Penicillins  Medications: Prior to Admission medications   Medication Sig Start Date End Date Taking? Authorizing Provider  ACCU-CHEK GUIDE test strip USE UP TO FOUR TIMES DAILY AS DIRECTED 12/01/21   Masters, [name], DO  acetaminophen (TYLENOL) 650 MG CR tablet Take 1,300-1,950 mg by mouth every 8 (eight) hours as needed for pain.    [provider]  atorvastatin (LIPITOR) 80 MG tablet Take 1 tablet (80 mg total) by mouth daily. 09/23/21   Lacinda Axon, MD  blood glucose meter kit and supplies Dispense based on patient and insurance preference. Use up to four times daily as directed. (FOR ICD-10 E10.9, E11.9). 09/28/21   Masters, [name], DO  Continuous Blood Gluc Transmit (DEXCOM G6 TRANSMITTER) MISC Check blood sugar at least 8 times a day 09/22/21   Masters, [name], DO  diphenhydrAMINE HCl, Sleep, 50 MG CAPS Take 150 mg by mouth at bedtime.    [provider]  gabapentin (NEURONTIN) 300 MG capsule Take 3 capsules (900 mg total) by mouth 3 (three) times daily. 08/10/21   Sanjuan Dame, MD  insulin glargine (LANTUS SOLOSTAR) 100 UNIT/ML Solostar  Pen Inject 80 Units into the skin at bedtime. 03/23/22 06/21/22  Lottie Mussel, MD  insulin lispro (HUMALOG) 100 UNIT/ML KwikPen INJECT 20-22 UNITS SUBCUTANEOUSLY THREE TIMES DAILY WITH MEALS Patient taking differently: Inject 20-24 Units into the skin 3 (three) times daily with meals. 03/16/22   Lottie Mussel, MD  Insulin Pen Needle 31G X 5 MM MISC Use as directed with Lantus and Humalog 09/07/21   Masters, [name], DO  Lancets [facility]  DELICA PLUS [id number]) MISC Check blood sugar 3 times a day 09/22/21   Masters, [name], DO  lidocaine-prilocaine (EMLA) cream Apply to affected area once Patient not taking: Reported on 04/14/2022 04/08/22   Heath Lark, MD  lisinopril (ZESTRIL) 20 MG tablet Take 1 tablet (20 mg total) by mouth daily. 12/27/21 12/27/22  [name], MD  meclizine (ANTIVERT) 25 MG tablet Take 1 tablet (25 mg total) by mouth 3 (three) times daily as needed for dizziness. 03/01/22   Masters, Katie, DO  metFORMIN (GLUCOPHAGE-XR) 500 MG 24 hr tablet Take 2 tablets (1,000 mg total) by mouth 2 (two) times daily with a meal. Patient taking differently: Take 2,000 mg by mouth at bedtime. 07/19/21   Masters, [name], DO  Multiple Vitamin (MULTIVITAMIN WITH MINERALS) TABS tablet Take 1 tablet by mouth every evening.     [provider]  omeprazole (PRILOSEC) 20 MG capsule Take 1 capsule (20 mg total) by mouth daily. Patient taking differently: Take 20 mg by mouth daily as needed (acid reflux). 05/04/20   Katherine Roan, MD  ondansetron (ZOFRAN) 8 MG tablet Take 1 tablet (8 mg total) by mouth 2 (two) times daily as needed. Start on the third day after chemotherapy. Patient not taking: Reported on 04/14/2022 04/08/22   Heath Lark, MD  prochlorperazine (COMPAZINE) 10 MG tablet Take 1 tablet (10 mg total) by mouth every 6 (six) hours as needed (Nausea or vomiting). Patient not taking: Reported on 04/14/2022 04/08/22   Heath Lark, MD  Semaglutide, 2 MG/DOSE, 8 MG/3ML SOPN Inject 2 mg into the skin once a week. 03/16/22   Lottie Mussel, MD  senna-docusate (SENOKOT-S) 8.6-50 MG tablet Take 2 tablets by mouth at bedtime. For AFTER surgery, do not take if having diarrhea 03/29/22   Joylene John D, NP  traMADol (ULTRAM) 50 MG tablet Take 1 tablet (50 mg total) by mouth every 6 (six) hours as needed for severe pain. For AFTER surgery only, do not take and drive 0/8/65   Dorothyann Gibbs, NP     Family History  Problem Relation Age of  Onset   Hypertension Mother    Dementia Mother    Diabetes Father    Hypertension Father    Diabetes Daughter    Colon cancer Neg Hx    Breast cancer Neg Hx    Ovarian cancer Neg Hx    Endometrial cancer Neg Hx    Pancreatic cancer Neg Hx    Prostate cancer Neg Hx     Social History   Socioeconomic History   Marital status: Divorced    Spouse name: Not on file   Number of children: 3   Years of education: Not on file   Highest education level: Not on file  Occupational History   Not on file  Tobacco Use   Smoking status: Former    Types: Cigarettes    Quit date: 11/28/1962    Years since quitting: 59.4   Smokeless tobacco: Never  Vaping Use   Vaping Use: Never used  Substance and  Sexual Activity   Alcohol use: No    Alcohol/week: 0.0 standard drinks   Drug use: No   Sexual activity: Not Currently  Other Topics Concern   Not on file  Social History Narrative   Not on file   Social Determinants of Health   Financial Resource Strain: Not on file  Food Insecurity: Not on file  Transportation Needs: Not on file  Physical Activity: Not on file  Stress: Not on file  Social Connections: Not on file   Review of Systems: A 12 point ROS discussed and pertinent positives are indicated in the HPI above.  All other systems are negative.  Review of Systems  Vital Signs: LMP 06/23/2014   Physical Exam  Imaging: No results found.  Labs:  CBC: Recent Labs    12/09/21 0849 12/24/21 1150 03/30/22 1055 03/31/22 0452  WBC 8.0 6.9 8.0 8.6  HGB 12.2 11.4* 11.0* 11.2*  HCT 38.0 34.1* 35.4* 34.7*  PLT 289 225 345 358    COAGS: No results for input(s): INR, APTT in the last 8760 hours.  BMP: Recent Labs    12/24/21 1150 03/01/22 1052 03/30/22 1055 03/31/22 0452  NA 131* 139 139 138  K 4.8 3.9 4.4 4.6  CL 100 109 109 109  CO2 21* 22 22 19*  GLUCOSE 511* 238* 124* 230*  BUN 26* _0 CALCIUM 9.1 9.3 9.3 8.8*  CREATININE 1.56* 0.95 0.79 0.86   GFRNONAA 38* >60 >60 >60    LIVER FUNCTION TESTS: Recent Labs    06/29/21 1935 07/01/21 0310 09/22/21 0954 03/30/22 1055  BILITOT 1.1 0.9 <0.2 0.5  AST 43* 34 26 17  ALT 56* 47* 31 15  ALKPHOS 70 59 103 64  PROT 8.4* 7.8 7.3 7.5  ALBUMIN 4.1 3.7 4.4 4.0    TUMOR MARKERS: No results for input(s): AFPTM, CEA, CA199, CHROMGRNA in the last 8760 hours.  Assessment and Plan:  Risks and benefits of image guided tunneled catheter with port placement was discussed with the patient including, but not limited to bleeding, infection, pneumothorax, or fibrin sheath development and need for additional procedures.  All of the patient's questions were answered, patient is agreeable to proceed. Consent signed and in chart.   Thank you for this interesting consult.  I greatly enjoyed meeting Nickolette A Karn and look forward to participating in their care.  A copy of this report was sent to the requesting provider on this date.  Electronically Signed: Tyson Alias, NP 04/18/2022, 5:14 PM   I spent a total of {New ZOXW:[id number]} {New Out-Pt:[id number]}  {Established Out-Pt:[id number]} in face to face in clinical consultation, greater than 50% of which was counseling/coordinating care for ***

## 2022-04-19 ENCOUNTER — Ambulatory Visit (HOSPITAL_COMMUNITY)
Admission: RE | Admit: 2022-04-19 | Discharge: 2022-04-19 | Disposition: A | Discharge status: Home / Self Care | Payer: Medicaid Other | Source: Ambulatory Visit | Attending: Hematology and Oncology | Admitting: Hematology and Oncology

## 2022-04-19 ENCOUNTER — Encounter (HOSPITAL_COMMUNITY): Payer: Self-pay

## 2022-04-19 DIAGNOSIS — I10 Essential (primary) hypertension: Secondary | ICD-10-CM | POA: Diagnosis not present

## 2022-04-19 DIAGNOSIS — E119 Type 2 diabetes mellitus without complications: Secondary | ICD-10-CM | POA: Diagnosis not present

## 2022-04-19 DIAGNOSIS — C541 Malignant neoplasm of endometrium: Secondary | ICD-10-CM | POA: Diagnosis present

## 2022-04-19 DIAGNOSIS — Z87891 Personal history of nicotine dependence: Secondary | ICD-10-CM | POA: Diagnosis not present

## 2022-04-19 DIAGNOSIS — H8110 Benign paroxysmal vertigo, unspecified ear: Secondary | ICD-10-CM | POA: Diagnosis not present

## 2022-04-19 HISTORY — PX: IR IMAGING GUIDED PORT INSERTION: IMG5740

## 2022-04-19 MED ORDER — LIDOCAINE HCL 1 % IJ SOLN
INTRAMUSCULAR | Status: AC | PRN
  Administered 2022-04-19: 10 mL via INTRADERMAL

## 2022-04-19 MED ORDER — FENTANYL CITRATE (PF) 100 MCG/2ML IJ SOLN
INTRAMUSCULAR | Status: AC
  Filled 2022-04-19: qty 4

## 2022-04-19 MED ORDER — LIDOCAINE HCL 1 % IJ SOLN
INTRAMUSCULAR | Status: AC
  Filled 2022-04-19: qty 20

## 2022-04-19 MED ORDER — MIDAZOLAM HCL 2 MG/2ML IJ SOLN
INTRAMUSCULAR | Status: AC | PRN
  Administered 2022-04-19: 1 mg via INTRAVENOUS

## 2022-04-19 MED ORDER — HEPARIN SOD (PORK) LOCK FLUSH 100 UNIT/ML IV SOLN
INTRAVENOUS | Status: AC
  Filled 2022-04-19: qty 5

## 2022-04-19 MED ORDER — MIDAZOLAM HCL 2 MG/2ML IJ SOLN
INTRAMUSCULAR | Status: AC
  Filled 2022-04-19: qty 4

## 2022-04-19 MED ORDER — HEPARIN SOD (PORK) LOCK FLUSH 100 UNIT/ML IV SOLN
INTRAVENOUS | Status: AC | PRN
  Administered 2022-04-19: 500 [IU] via INTRAVENOUS

## 2022-04-19 MED ORDER — MIDAZOLAM HCL 2 MG/2ML IJ SOLN
INTRAMUSCULAR | Status: AC | PRN
Start: 2022-04-19 — End: 2022-04-19
  Administered 2022-04-19: 1 mg via INTRAVENOUS

## 2022-04-19 MED ORDER — SODIUM CHLORIDE 0.9 % IV SOLN: INTRAVENOUS | Status: DC

## 2022-04-19 MED ORDER — FENTANYL CITRATE (PF) 100 MCG/2ML IJ SOLN
INTRAMUSCULAR | Status: AC | PRN
  Administered 2022-04-19: 50 ug via INTRAVENOUS

## 2022-04-19 NOTE — Procedures (Signed)
Interventional Radiology Procedure Note  Procedure: Single Lumen Power Port Placement    Access:  Right IJ vein.  Findings: Catheter tip positioned at SVC/RA junction. Port is ready for immediate use.   Complications: None  EBL: < 10 mL  Recommendations:  - Ok to shower in 24 hours - Do not submerge for 7 days - Routine line care   Kiyona Mcnall T. Tomara Youngberg, M.D Pager:  [phone]   

## 2022-04-20 ENCOUNTER — Telehealth: Payer: Self-pay

## 2022-04-20 DIAGNOSIS — Z794 Long term (current) use of insulin: Secondary | ICD-10-CM

## 2022-04-20 MED ORDER — INSULIN LISPRO (1 UNIT DIAL) 100 UNIT/ML (KWIKPEN): 20.0000 [IU] | PEN_INJECTOR | Freq: Three times a day (TID) | SUBCUTANEOUS | 3 refills | Status: DC

## 2022-04-20 MED ORDER — METFORMIN HCL ER 500 MG PO TB24: 2000.0000 mg | ORAL_TABLET | Freq: Every day | ORAL | 3 refills | Status: DC

## 2022-04-20 NOTE — Telephone Encounter (Signed)
metFORMIN (GLUCOPHAGE-XR) 500 MG 24 hr tablet  insulin aspart (novoLOG) injection 0-15 Units    [place], [address], [place] [address]  Phone:  [phone]  Fax:  [phone]

## 2022-04-21 ENCOUNTER — Other Ambulatory Visit: Payer: Self-pay | Admitting: Oncology

## 2022-04-21 ENCOUNTER — Other Ambulatory Visit: Payer: Self-pay | Admitting: Hematology and Oncology

## 2022-04-21 DIAGNOSIS — C541 Malignant neoplasm of endometrium: Secondary | ICD-10-CM

## 2022-04-21 NOTE — Progress Notes (Signed)
Referral placed for radiation oncology.  

## 2022-04-21 NOTE — Progress Notes (Signed)
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 Findings may be associated with endometrial carcinoma or hyperplasia. Biopsy is recommended for further evaluation.   02/22/2022 Initial Biopsy   EMB: MMMT/carcinosarcoma (predominance of sarcomatous component)   03/05/2022 Imaging   Ct chest, abdomen and pelvis 1. Heterogeneous enlargement of the endometrial cavity is compatible with the reported history of uterine carcinosarcoma. No definite involvement of the posterior bladder wall or sigmoid colon/rectum. 2. 9 mm short axis right external iliac node is upper normal for size. Attention on follow-up recommended. Otherwise no lymphadenopathy in the abdomen or pelvis. 3. 3.6 x 2.7 cm lesion in the lateral segment left liver has subtle peripheral nodular enhancement. This is almost certainly a benign cavernous hemangioma, but MRI abdomen with and without contrast recommended to confirm. 4. Aortic Atherosclerosis (ICD10-I70.0).   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 On intra-abdominal exam, normal upper abdominal survey. Omentum adherent to the anterior abdominal wall along the prior midline incision. Normal omentum otherwise, normal small and large bowel. 12 cm uterus densely adherent to the anterior abdominal wall, obliterating some of the anterior anatomy including the anterior cul de sac. Normal appearing bilateral adnexa. Mapping successful to right obturator SLN, mildly  enlarged. Dye seen within the parametrium on the left, no SLNs identified. No obvious adenopathy on the left. Decision made given length of surgery, comorbitidies to defer left para-aortic lymphadenectomy. Mini-lap required for specimen delivery. Dome intact on cystoscopy and good efflux noted from bilateral ureteral orifices.   03/30/2022 Pathology Results   MMMT/carcinosarcoma, 6.3 cm MI 1cm or 3 (<50%) Cervical stroma, bilateral tubes/ovaries benign R SLN and L pelvic LNDs negative  ONCOLOGY TABLE:   UTERUS, CARCINOMA OR CARCINOSARCOMA: Resection   Procedure: Total hysterectomy and bilateral salpingo-oophorectomy  Histologic Type:  Malignant mixed Mullerian tumor (MMMT/ carcinosarcoma)  Histologic Grade: High-grade  Myometrial Invasion:       Depth of Myometrial Invasion (mm): 10 mm       Myometrial Thickness (mm): 30 mm       Percentage of Myometrial Invasion: 33%  Uterine Serosa Involvement: Not identified  Cervical stromal Involvement: Not identified  Extent of involvement of other tissue/organs: Not identified  Peritoneal/Ascitic Fluid: Not applicable  Lymphovascular Invasion: Not identified  Regional Lymph Nodes:       Pelvic Lymph Nodes Examined:                                   1 Sentinel                                   6 Non-sentinel                                   7 Total       Pelvic Lymph Nodes with Metastasis: 0  Macrometastasis: (>2.0 mm): 0                           Micrometastasis: (>0.2 mm and < 2.0 mm): 0                           Isolated Tumor Cells (<0.2 mm): 0                           Laterality of Lymph Node with Tumor: Not  applicable                           Extracapsular Extension: Not applicable       Para-aortic Lymph Nodes Examined:                                    0 Sentinel                                    0 non-sentinel                                    0 total  Distant Metastasis:       Distant Site(s)  Involved: Not applicable  Pathologic Stage Classification (pTNM, AJCC 8th Edition): pT1a, pN0  Ancillary Studies: MMR / MSI testing will be ordered  Representative Tumor Block: B1  Comment(s): Pancytokeratin was performed on the lymph nodes and is  negative.    04/08/2022 Cancer Staging   Staging form: Corpus Uteri - Carcinoma and Carcinosarcoma, AJCC 8th Edition - Pathologic stage from 04/08/2022: FIGO Stage IA (pT1a, pN0, cM0) - Signed by Heath Lark, MD on 04/08/2022 Stage prefix: Initial diagnosis    04/21/2022 Procedure   Placement of single lumen port a cath via right internal jugular vein. The catheter tip lies at the cavo-atrial junction. A power injectable port a cath was placed and is ready for immediate use.       04/29/2022 -  Chemotherapy   Patient is on Treatment Plan : UTERINE Carboplatin AUC 6 / Paclitaxel q21d        Interval History: The patient met with Dr. Alvy Bimler on 5/12, was seen again this morning. Port was placed on 5/23. Cycle #1 is planned on 6/2.  Reports doing well since surgery.  Denies any vaginal bleeding, has had some vaginal discharge since surgery.  She is unsure if this is new since surgery or if she was having some of this before but did not know because of the bleeding.  Denies any leaking of fluid.  Reports normal bowel and bladder function.  Appetite is improving.  Blood sugars have been under 200 since surgery.  Has some baseline vertigo.  Past Medical/Surgical History: Past Medical History:  Diagnosis Date   Anemia    Asthma 11/01/2010   only when respiratory infections   BPPV (benign paroxysmal positional vertigo) 03/07/2013   Diabetes mellitus    since 1983   Elevated TSH 05/13/2013   Endometrial cancer (HCC)    GERD (gastroesophageal reflux disease)    Hypertension    Left elbow pain 05/05/2016   Left shoulder pain  05/05/2016   Osteomyelitis ([place])    Pneumonia    as a child   Wrist pain 04/11/2013    Past Surgical History:   Procedure Laterality Date   ABDOMINAL HYSTERECTOMY     ACHILLES TENDON LENGTHENING Left 08/29/2014   Procedure: Left Achilles Lengthening;  Surgeon: Newt Minion, MD;  Location: [place];  Service: Orthopedics;  Laterality: Left;   AMPUTATION Left 02/19/2013   Procedure: Amputation of Left Great Toe;  Surgeon: Mcarthur Rossetti, MD;  Location: [place];  Service: Orthopedics;  Laterality: Left;   AMPUTATION Left 08/29/2014   Procedure: Left Foot 2nd and 1st Toe Amputation;  Surgeon: Newt Minion, MD;  Location: [place];  Service: Orthopedics;  Laterality: Left;   CARDIAC CATHETERIZATION     15 years ago   CESAREAN SECTION     x 4   CYSTOSCOPY  03/30/2022   Procedure: CYSTOSCOPY;  Surgeon: [name], MD;  Location: [facility];  Service: Gynecology;;   IR IMAGING GUIDED PORT INSERTION  04/19/2022   ROBOTIC ASSISTED TOTAL HYSTERECTOMY WITH BILATERAL SALPINGO OOPHERECTOMY Bilateral 03/30/2022   Procedure: XI ROBOTIC ASSISTED TOTAL HYSTERECTOMY WITH BILATERAL SALPINGO-OOPHORECTOMY, MINI LAPAROTOMY;  Surgeon: [name], MD;  Location: [facility];  Service: Gynecology;  Laterality: Bilateral;   SENTINEL NODE BIOPSY N/A 03/30/2022   Procedure: SENTINEL NODE BIOPSY;  Surgeon: [name], MD;  Location: [facility];  Service: Gynecology;  Laterality: N/A;   TUBAL LIGATION     at one of c-sections    Family History  Problem Relation Age of Onset   Hypertension Mother    Dementia Mother    Diabetes Father    Hypertension Father    Diabetes Daughter    Colon cancer Neg Hx    Breast cancer Neg Hx    Ovarian cancer Neg Hx    Endometrial cancer Neg Hx    Pancreatic cancer Neg Hx    Prostate cancer Neg Hx     Social History   Socioeconomic History   Marital status: Divorced    Spouse name: Not on file   Number of children: 3   Years of education: Not on file   Highest education level: Not on file  Occupational History   Not on file  Tobacco Use   Smoking status: Former     Types: Cigarettes    Quit date: 11/28/1962    Years since quitting: 59.4   Smokeless tobacco: Never  Vaping Use   Vaping Use: Never used  Substance and Sexual Activity   Alcohol use: No    Alcohol/week: 0.0 standard drinks   Drug use: No   Sexual activity: Not Currently  Other Topics Concern   Not on file  Social History Narrative   Not on file   Social Determinants of Health   Financial Resource Strain: Not on file  Food Insecurity: Not on file  Transportation Needs: Not on file  Physical Activity: Not on file  Stress: Not on file  Social Connections: Not on file    Current Medications:  Current Outpatient Medications:    ACCU-CHEK GUIDE test strip, USE UP TO FOUR TIMES DAILY AS DIRECTED, Disp: 100 each, Rfl: 3   acetaminophen (TYLENOL) 650 MG CR tablet, Take 1,300-1,950 mg by mouth every 8 (eight) hours as needed for pain., Disp: , Rfl:    atorvastatin (LIPITOR) 80 MG tablet, Take 1 tablet (80 mg total) by mouth daily., Disp: 90 tablet, Rfl: 3   blood glucose meter  kit and supplies, Dispense based on patient and insurance preference. Use up to four times daily as directed. (FOR ICD-10 E10.9, E11.9)., Disp: 1 each, Rfl: 0   Continuous Blood Gluc Transmit (DEXCOM G6 TRANSMITTER) MISC, Check blood sugar at least 8 times a day, Disp: 1 each, Rfl: 3   diphenhydrAMINE HCl, Sleep, 50 MG CAPS, Take 150 mg by mouth at bedtime., Disp: , Rfl:    gabapentin (NEURONTIN) 300 MG capsule, Take 3 capsules (900 mg total) by mouth 3 (three) times daily., Disp: 270 capsule, Rfl: 6   insulin glargine (LANTUS SOLOSTAR) 100 UNIT/ML Solostar Pen, Inject 80 Units into the skin at bedtime., Disp: 24 mL, Rfl: 2   Insulin Pen Needle 31G X 5 MM MISC, Use as directed with Lantus and Humalog, Disp: 100 each, Rfl: PRN   Lancets (ONETOUCH DELICA PLUS [id number]) MISC, Check blood sugar 3 times a day, Disp: 300 each, Rfl: 3   lisinopril (ZESTRIL) 20 MG tablet, Take 1 tablet (20 mg total) by mouth daily.,  Disp: 30 tablet, Rfl: 11   meclizine (ANTIVERT) 25 MG tablet, Take 1 tablet (25 mg total) by mouth 3 (three) times daily as needed for dizziness., Disp: 60 tablet, Rfl: 1   Multiple Vitamin (MULTIVITAMIN WITH MINERALS) TABS tablet, Take 1 tablet by mouth every evening. , Disp: , Rfl:    omeprazole (PRILOSEC) 20 MG capsule, Take 1 capsule (20 mg total) by mouth daily. (Patient taking differently: Take 20 mg by mouth daily as needed (acid reflux).), Disp: 30 capsule, Rfl: 1   Semaglutide, 2 MG/DOSE, 8 MG/3ML SOPN, Inject 2 mg into the skin once a week., Disp: 10 mL, Rfl: 3   senna-docusate (SENOKOT-S) 8.6-50 MG tablet, Take 2 tablets by mouth at bedtime. For AFTER surgery, do not take if having diarrhea, Disp: 30 tablet, Rfl: 0   traMADol (ULTRAM) 50 MG tablet, Take 1 tablet (50 mg total) by mouth every 6 (six) hours as needed for severe pain. For AFTER surgery only, do not take and drive, Disp: 15 tablet, Rfl: 0   dexamethasone (DECADRON) 4 MG tablet, Take 2 tabs at the night before chemotherapy, every 3 weeks, by mouth x 6 cycles, Disp: 12 tablet, Rfl: 6   insulin lispro (HUMALOG) 100 UNIT/ML KwikPen, Inject 20-24 Units into the skin 3 (three) times daily with meals., Disp: 65 mL, Rfl: 3   lidocaine-prilocaine (EMLA) cream, Apply to affected area once (Patient not taking: Reported on 04/14/2022), Disp: 30 g, Rfl: 3   metFORMIN (GLUCOPHAGE-XR) 500 MG 24 hr tablet, Take 4 tablets (2,000 mg total) by mouth at bedtime., Disp: 360 tablet, Rfl: 3   ondansetron (ZOFRAN) 8 MG tablet, Take 1 tablet (8 mg total) by mouth 2 (two) times daily as needed. Start on the third day after chemotherapy. (Patient not taking: Reported on 04/14/2022), Disp: 30 tablet, Rfl: 1   prochlorperazine (COMPAZINE) 10 MG tablet, Take 1 tablet (10 mg total) by mouth every 6 (six) hours as needed (Nausea or vomiting). (Patient not taking: Reported on 04/14/2022), Disp: 30 tablet, Rfl: 1  Review of Systems: + dizziness (vertigo) Denies  appetite changes, fevers, chills, fatigue, unexplained weight changes. Denies hearing loss, neck lumps or masses, mouth sores, ringing in ears or voice changes. Denies cough or wheezing.  Denies shortness of breath. Denies chest pain or palpitations. Denies leg swelling. Denies abdominal distention, pain, blood in stools, constipation, diarrhea, nausea, vomiting, or early satiety. Denies pain with intercourse, dysuria, frequency, hematuria or incontinence. Denies hot flashes, pelvic  pain, vaginal bleeding.   Denies joint pain, back pain or muscle pain/cramps. Denies itching, rash, or wounds. Denies headaches, numbness or seizures. Denies swollen lymph nodes or glands, denies easy bruising or bleeding. Denies anxiety, depression, confusion, or decreased concentration.  Physical Exam: BP (!) 158/61 (BP Location: Right Arm, Patient Position: Sitting)   Pulse 100   Temp 98.2 F (36.8 C) (Oral)   Resp 16   Ht '6\' 1"'  (1.854 m)   Wt 274 lb 6.4 oz (124.5 kg)   LMP 06/23/2014   SpO2 100%   BMI 36.20 kg/m  General: Alert, oriented, no acute distress. HEENT: Normocephalic, atraumatic, sclera anicteric. Chest: Unlabored breathing on room air.  Port site dressed. Abdomen: Obese, soft, nontender.  Normoactive bowel sounds.  No masses or hepatosplenomegaly appreciated.  Well-healed incisions, remaining Dermabond removed. Extremities: Grossly normal range of motion.  Warm, well perfused.  No edema bilaterally. Skin: No rashes or lesions noted. GU: Normal appearing external genitalia without erythema, excoriation, or lesions.  Speculum exam reveals increased white/slightly tinged yellow discharge, no bleeding.  Somewhat difficult to visualize the cuff.  On bimanual exam, cuff is intact with no tenderness appreciated on palpation.    Laboratory & Radiologic Studies: None new  Assessment & Plan: [name] is a 61 y.o. 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 MMR IHC intact, MS stable, p53 wildtype.  Patient is doing well and meeting post-operative milestones. Discussed continued restrictions and post-operative expectations.  In the setting of her discharge, discussed treating with Flagyl.  She does not have exam findings consistent with cuff cellulitis but if she does not have improvement in her discharge over the weekend, will add levofloxacin.  I have asked her to call on Tuesday to let us know.    Reviewed pathology results from surgery again with her.  She was given a copy of her pathology report. Tumor confirmed to be carcinosarcoma, confined to the inner half of the myometrium. Given high-risk histology, we discussed adjuvant treatment, which will include chemotherapy and vaginal brachytherapy.  Reviewed normal MMR and MSI testing.  Patient is scheduled to start chemotherapy next week.  Appointment has been made for her to see radiation oncology.  28 minutes of total time was spent for this patient encounter, including preparation, face-to-face counseling with the patient and coordination of care, and documentation of the encounter.  Jeral Pinch, MD  Division of Gynecologic Oncology  Department of Obstetrics and Gynecology  [facility]

## 2022-04-21 NOTE — Patient Instructions (Addendum)
It was good to see you today. Please remember, no heavy lifting for 6 weeks and nothing in the vagina for at least 8 weeks.  I will plan to see you after you finish your chemotherapy and radiation.   Please don't hesitate to call if you need anything.   I'm sending an antibiotic to your pharmacy that should help decrease your discharge. If you don't notice a difference over the next week, please call. If you have increased discharge, fevers/chills, or other symptoms, please call the clinic.

## 2022-04-22 ENCOUNTER — Inpatient Hospital Stay (HOSPITAL_BASED_OUTPATIENT_CLINIC_OR_DEPARTMENT_OTHER): Payer: Medicaid Other | Admitting: Gynecologic Oncology

## 2022-04-22 ENCOUNTER — Inpatient Hospital Stay (HOSPITAL_BASED_OUTPATIENT_CLINIC_OR_DEPARTMENT_OTHER): Payer: Medicaid Other | Admitting: Hematology and Oncology

## 2022-04-22 ENCOUNTER — Inpatient Hospital Stay: Payer: Medicaid Other

## 2022-04-22 ENCOUNTER — Encounter: Payer: Self-pay | Admitting: Gynecologic Oncology

## 2022-04-22 ENCOUNTER — Encounter: Payer: Self-pay | Admitting: Hematology and Oncology

## 2022-04-22 ENCOUNTER — Other Ambulatory Visit: Payer: Self-pay

## 2022-04-22 VITALS — BP 158/61 | HR 100 | Temp 98.2°F | Resp 16 | Ht 73.0 in | Wt 274.4 lb

## 2022-04-22 DIAGNOSIS — E1165 Type 2 diabetes mellitus with hyperglycemia: Secondary | ICD-10-CM | POA: Diagnosis not present

## 2022-04-22 DIAGNOSIS — E669 Obesity, unspecified: Secondary | ICD-10-CM | POA: Diagnosis not present

## 2022-04-22 DIAGNOSIS — C541 Malignant neoplasm of endometrium: Secondary | ICD-10-CM | POA: Diagnosis not present

## 2022-04-22 DIAGNOSIS — Z90722 Acquired absence of ovaries, bilateral: Secondary | ICD-10-CM

## 2022-04-22 DIAGNOSIS — N898 Other specified noninflammatory disorders of vagina: Secondary | ICD-10-CM

## 2022-04-22 DIAGNOSIS — C55 Malignant neoplasm of uterus, part unspecified: Secondary | ICD-10-CM

## 2022-04-22 DIAGNOSIS — Z90710 Acquired absence of both cervix and uterus: Secondary | ICD-10-CM

## 2022-04-22 LAB — CBC WITH DIFFERENTIAL (CANCER CENTER ONLY)
Abs Immature Granulocytes: 0.02 10*3/uL (ref 0.00–0.07)
Basophils Absolute: 0 10*3/uL (ref 0.0–0.1)
Basophils Relative: 0 %
Eosinophils Absolute: 0.2 10*3/uL (ref 0.0–0.5)
Eosinophils Relative: 3 %
HCT: 31.9 % — ABNORMAL LOW (ref 36.0–46.0)
Hemoglobin: 10.2 g/dL — ABNORMAL LOW (ref 12.0–15.0)
Immature Granulocytes: 0 %
Lymphocytes Relative: 40 %
Lymphs Abs: 3.1 10*3/uL (ref 0.7–4.0)
MCH: 31.5 pg (ref 26.0–34.0)
MCHC: 32 g/dL (ref 30.0–36.0)
MCV: 98.5 fL (ref 80.0–100.0)
Monocytes Absolute: 0.5 10*3/uL (ref 0.1–1.0)
Monocytes Relative: 6 %
Neutro Abs: 3.9 10*3/uL (ref 1.7–7.7)
Neutrophils Relative %: 51 %
Platelet Count: 339 10*3/uL (ref 150–400)
RBC: 3.24 MIL/uL — ABNORMAL LOW (ref 3.87–5.11)
RDW: 13.2 % (ref 11.5–15.5)
WBC Count: 7.7 10*3/uL (ref 4.0–10.5)
nRBC: 0 % (ref 0.0–0.2)

## 2022-04-22 LAB — CMP (CANCER CENTER ONLY)
ALT: 11 U/L (ref 0–44)
AST: 13 U/L — ABNORMAL LOW (ref 15–41)
Albumin: 3.8 g/dL (ref 3.5–5.0)
Alkaline Phosphatase: 84 U/L (ref 38–126)
Anion gap: 6 (ref 5–15)
BUN: 6 mg/dL (ref 6–20)
CO2: 29 mmol/L (ref 22–32)
Calcium: 9.1 mg/dL (ref 8.9–10.3)
Chloride: 109 mmol/L (ref 98–111)
Creatinine: 0.7 mg/dL (ref 0.44–1.00)
GFR, Estimated: >60 mL/min (ref >60)
Glucose, Bld: 47 mg/dL — ABNORMAL LOW (ref 70–99)
Potassium: 3.4 mmol/L — ABNORMAL LOW (ref 3.5–5.1)
Sodium: 144 mmol/L (ref 135–145)
Total Bilirubin: 0.3 mg/dL (ref 0.3–1.2)
Total Protein: 7 g/dL (ref 6.5–8.1)

## 2022-04-22 MED ORDER — DEXAMETHASONE 4 MG PO TABS: ORAL_TABLET | ORAL | 6 refills | Status: DC

## 2022-04-22 MED ORDER — METRONIDAZOLE 500 MG PO TABS: 500.0000 mg | ORAL_TABLET | Freq: Two times a day (BID) | ORAL | 0 refills | Status: AC

## 2022-04-22 NOTE — Assessment & Plan Note (Signed)
We discussed again the role of chemotherapy I went through expected side effects with the patient Due to her high calculated dose of chemotherapy related to her obesity, I plan upfront dose reduction of carboplatin by keeping it at maximum dose of 750 mg and reducing paclitaxel by 25% I reminded her to take premed oral dexamethasone I will see her prior to cycle 2 of treatment

## 2022-04-22 NOTE — Assessment & Plan Note (Signed)
Due to her body weight, the calculated dose of chemotherapy comes back very high I plan to use carboplatin AUC of 5 and to keep maximum carboplatin at 750 mg

## 2022-04-22 NOTE — Assessment & Plan Note (Signed)
She has history of uncontrolled diabetes Her blood sugar this morning was low but she ate food after blood draw She will continue vigilant blood sugar monitoring while on treatment If her blood sugar is over 300, the patient is instructed to call me and to take additional short acting insulin

## 2022-04-22 NOTE — Progress Notes (Signed)
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[facility]) We discussed again the role of chemotherapy I went through expected side effects with the patient Due to her high calculated dose of chemotherapy related to her obesity, I plan upfront dose reduction of carboplatin by keeping it at maximum dose of 750 mg and reducing paclitaxel by 25% I reminded her to take premed oral dexamethasone I will see her prior to cycle 2 of treatment  Uncontrolled diabetes mellitus with hyperglycemia ([place]) She has history of uncontrolled diabetes Her blood sugar this morning was low but she ate food after blood draw She will continue vigilant blood sugar monitoring while on treatment If her blood sugar is over 300, the patient is instructed to call me and to take additional short acting insulin  Obesity, Class II, BMI 35-39.9 Due to her body weight, the calculated dose of chemotherapy comes back very high I plan to use carboplatin AUC of 5 and to keep maximum carboplatin at 750 mg  No orders of the defined types were placed in this encounter.   All questions were answered. The patient knows to call the clinic with any problems, questions or concerns. The total time spent in the appointment was 30 minutes encounter with patients including review of chart and various tests results, discussions about plan of care and coordination of care plan   Heath Lark, MD 04/22/2022 11:05 AM  INTERVAL HISTORY: Please see below for problem oriented charting. she returns for treatment follow-up seen prior to cycle 1 of treatment She tolerated recent port placement well She denies recent signs or symptoms of infection Her wound is healing well She denies recent constipation  REVIEW OF SYSTEMS:   Constitutional: Denies fevers, chills or abnormal weight loss Eyes: Denies blurriness of  vision Ears, nose, mouth, throat, and face: Denies mucositis or sore throat Respiratory: Denies cough, dyspnea or wheezes Cardiovascular: Denies palpitation, chest discomfort or lower extremity swelling Gastrointestinal:  Denies nausea, heartburn or change in bowel habits Skin: Denies abnormal skin rashes Lymphatics: Denies new lymphadenopathy or easy bruising Neurological:Denies numbness, tingling or new weaknesses Behavioral/Psych: Mood is stable, no new changes  All other systems were reviewed with the patient and are negative.  I have reviewed the past medical history, past surgical history, social history and family history with the patient and they are unchanged from previous note.  ALLERGIES:  is allergic to nsaids and penicillins.  MEDICATIONS:  Current Outpatient Medications  Medication Sig Dispense Refill   dexamethasone (DECADRON) 4 MG tablet Take 2 tabs at the night before chemotherapy, every 3 weeks, by mouth x 6 cycles 12 tablet 6   ACCU-CHEK GUIDE test strip USE UP TO FOUR TIMES DAILY AS DIRECTED 100 each 3   acetaminophen (TYLENOL) 650 MG CR tablet Take 1,300-1,950 mg by mouth every 8 (eight) hours as needed for pain.     atorvastatin (LIPITOR) 80 MG tablet Take 1 tablet (80 mg total) by mouth daily. 90 tablet 3   blood glucose meter kit and supplies Dispense based on patient and insurance preference. Use up to four times daily as directed. (FOR ICD-10 E10.9, E11.9). 1 each 0   Continuous Blood Gluc Transmit (DEXCOM G6 TRANSMITTER) MISC Check blood sugar at least 8 times a day 1 each 3   diphenhydrAMINE HCl, Sleep, 50 MG CAPS Take 150 mg by mouth at bedtime.  gabapentin (NEURONTIN) 300 MG capsule Take 3 capsules (900 mg total) by mouth 3 (three) times daily. 270 capsule 6   insulin glargine (LANTUS SOLOSTAR) 100 UNIT/ML Solostar Pen Inject 80 Units into the skin at bedtime. 24 mL 2   insulin lispro (HUMALOG) 100 UNIT/ML KwikPen Inject 20-24 Units into the skin 3 (three)  times daily with meals. 65 mL 3   Insulin Pen Needle 31G X 5 MM MISC Use as directed with Lantus and Humalog 100 each PRN   Lancets (ONETOUCH DELICA PLUS [id number]) MISC Check blood sugar 3 times a day 300 each 3   lidocaine-prilocaine (EMLA) cream Apply to affected area once (Patient not taking: Reported on 04/14/2022) 30 g 3   lisinopril (ZESTRIL) 20 MG tablet Take 1 tablet (20 mg total) by mouth daily. 30 tablet 11   meclizine (ANTIVERT) 25 MG tablet Take 1 tablet (25 mg total) by mouth 3 (three) times daily as needed for dizziness. 60 tablet 1   metFORMIN (GLUCOPHAGE-XR) 500 MG 24 hr tablet Take 4 tablets (2,000 mg total) by mouth at bedtime. 360 tablet 3   Multiple Vitamin (MULTIVITAMIN WITH MINERALS) TABS tablet Take 1 tablet by mouth every evening.      omeprazole (PRILOSEC) 20 MG capsule Take 1 capsule (20 mg total) by mouth daily. (Patient taking differently: Take 20 mg by mouth daily as needed (acid reflux).) 30 capsule 1   ondansetron (ZOFRAN) 8 MG tablet Take 1 tablet (8 mg total) by mouth 2 (two) times daily as needed. Start on the third day after chemotherapy. (Patient not taking: Reported on 04/14/2022) 30 tablet 1   prochlorperazine (COMPAZINE) 10 MG tablet Take 1 tablet (10 mg total) by mouth every 6 (six) hours as needed (Nausea or vomiting). (Patient not taking: Reported on 04/14/2022) 30 tablet 1   Semaglutide, 2 MG/DOSE, 8 MG/3ML SOPN Inject 2 mg into the skin once a week. 10 mL 3   senna-docusate (SENOKOT-S) 8.6-50 MG tablet Take 2 tablets by mouth at bedtime. For AFTER surgery, do not take if having diarrhea 30 tablet 0   traMADol (ULTRAM) 50 MG tablet Take 1 tablet (50 mg total) by mouth every 6 (six) hours as needed for severe pain. For AFTER surgery only, do not take and drive 15 tablet 0   No current facility-administered medications for this visit.    SUMMARY OF ONCOLOGIC HISTORY: Oncology History Overview Note  P53 wild type   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([place])   02/01/2022 Initial Diagnosis   She presented with postmenopausal bleeding   02/07/2022 Imaging   US pelvis Thickened heterogeneous endometrium with masslike region with internal vascularity. Findings may be associated with endometrial carcinoma or hyperplasia. Biopsy is recommended for further evaluation.   02/22/2022 Initial Biopsy   EMB: MMMT/carcinosarcoma (predominance of sarcomatous component)   03/05/2022 Imaging   Ct chest, abdomen and pelvis 1. Heterogeneous enlargement of the endometrial cavity is compatible with the reported history of uterine carcinosarcoma. No definite involvement of the posterior bladder wall or sigmoid colon/rectum. 2. 9 mm short axis right external iliac node is upper normal for size. Attention on follow-up recommended. Otherwise no lymphadenopathy in the abdomen or pelvis. 3. 3.6 x 2.7 cm lesion in the lateral segment left liver has subtle peripheral nodular enhancement. This is almost certainly a benign cavernous hemangioma, but MRI abdomen with and without contrast recommended to confirm. 4. Aortic Atherosclerosis (ICD10-I70.0).   03/08/2022 Initial Diagnosis   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([place])   03/30/2022 Surgery   TRH/BSO,  SLN biopsy on right, left pelvic LND, LOS, mini-lap  Findings: On EUA, 12cm minimally mobile uterus. On intra-abdominal exam, normal upper abdominal survey. Omentum adherent to the anterior abdominal wall along the prior midline incision. Normal omentum otherwise, normal small and large bowel. 12 cm uterus densely adherent to the anterior abdominal wall, obliterating some of the anterior anatomy including the anterior cul de sac. Normal appearing bilateral adnexa. Mapping successful to right obturator SLN, mildly enlarged. Dye seen within the parametrium on the left, no SLNs identified. No obvious adenopathy on the left. Decision made given length of surgery, comorbitidies to defer left para-aortic lymphadenectomy. Mini-lap required for  specimen delivery. Dome intact on cystoscopy and good efflux noted from bilateral ureteral orifices.   03/30/2022 Pathology Results   MMMT/carcinosarcoma, 6.3 cm MI 1cm or 3 (<50%) Cervical stroma, bilateral tubes/ovaries benign R SLN and L pelvic LNDs negative  ONCOLOGY TABLE:   UTERUS, CARCINOMA OR CARCINOSARCOMA: Resection   Procedure: Total hysterectomy and bilateral salpingo-oophorectomy  Histologic Type:  Malignant mixed Mullerian tumor (MMMT/ carcinosarcoma)  Histologic Grade: High-grade  Myometrial Invasion:       Depth of Myometrial Invasion (mm): 10 mm       Myometrial Thickness (mm): 30 mm       Percentage of Myometrial Invasion: 33%  Uterine Serosa Involvement: Not identified  Cervical stromal Involvement: Not identified  Extent of involvement of other tissue/organs: Not identified  Peritoneal/Ascitic Fluid: Not applicable  Lymphovascular Invasion: Not identified  Regional Lymph Nodes:       Pelvic Lymph Nodes Examined:                                   1 Sentinel                                   6 Non-sentinel                                   7 Total       Pelvic Lymph Nodes with Metastasis: 0                           Macrometastasis: (>2.0 mm): 0                           Micrometastasis: (>0.2 mm and < 2.0 mm): 0                           Isolated Tumor Cells (<0.2 mm): 0                           Laterality of Lymph Node with Tumor: Not  applicable                           Extracapsular Extension: Not applicable       Para-aortic Lymph Nodes Examined:                                    0 Sentinel  0 non-sentinel                                    0 total  Distant Metastasis:       Distant Site(s) Involved: Not applicable  Pathologic Stage Classification (pTNM, AJCC 8th Edition): pT1a, pN0  Ancillary Studies: MMR / MSI testing will be ordered  Representative Tumor Block: B1  Comment(s): Pancytokeratin was performed on  the lymph nodes and is  negative.    04/08/2022 Cancer Staging   Staging form: Corpus Uteri - Carcinoma and Carcinosarcoma, AJCC 8th Edition - Pathologic stage from 04/08/2022: FIGO Stage IA (pT1a, pN0, cM0) - Signed by Heath Lark, MD on 04/08/2022 Stage prefix: Initial diagnosis    04/21/2022 Procedure   Placement of single lumen port a cath via right internal jugular vein. The catheter tip lies at the cavo-atrial junction. A power injectable port a cath was placed and is ready for immediate use.       04/29/2022 -  Chemotherapy   Patient is on Treatment Plan : UTERINE Carboplatin AUC 6 / Paclitaxel q21d        PHYSICAL EXAMINATION: ECOG PERFORMANCE STATUS: 1 - Symptomatic but completely ambulatory  Vitals:   04/22/22 1036  BP: (!) 144/55  Pulse: 95  Resp: 18  Temp: 98.2 F (36.8 C)  SpO2: 100%   Filed Weights   04/22/22 1036  Weight: 281 lb 9.6 oz (127.7 kg)    GENERAL:alert, no distress and comfortable NEURO: alert & oriented x 3 with fluent speech, no focal motor/sensory deficits  LABORATORY DATA:  I have reviewed the data as listed    Component Value Date/Time   NA 144 04/22/2022 0959   NA 139 09/22/2021 0954   K 3.4 (L) 04/22/2022 0959   CL 109 04/22/2022 0959   CO2 29 04/22/2022 0959   GLUCOSE 47 (L) 04/22/2022 0959   BUN 6 04/22/2022 0959   BUN 13 09/22/2021 0954   CREATININE 0.70 04/22/2022 0959   CREATININE 0.67 04/23/2015 1645   CALCIUM 9.1 04/22/2022 0959   PROT 7.0 04/22/2022 0959   PROT 7.3 09/22/2021 0954   ALBUMIN 3.8 04/22/2022 0959   ALBUMIN 4.4 09/22/2021 0954   AST 13 (L) 04/22/2022 0959   ALT 11 04/22/2022 0959   ALKPHOS 84 04/22/2022 0959   BILITOT 0.3 04/22/2022 0959   GFRNONAA >60 04/22/2022 0959   GFRNONAA >89 01/06/2014 1643   GFRAA >60 02/25/2020 0334   GFRAA >89 01/06/2014 1643    No results found for: SPEP, UPEP  Lab Results  Component Value Date   WBC 7.7 04/22/2022   NEUTROABS 3.9 04/22/2022   HGB 10.2 (L) 04/22/2022    HCT 31.9 (L) 04/22/2022   MCV 98.5 04/22/2022   PLT 339 04/22/2022      Chemistry      Component Value Date/Time   NA 144 04/22/2022 0959   NA 139 09/22/2021 0954   K 3.4 (L) 04/22/2022 0959   CL 109 04/22/2022 0959   CO2 29 04/22/2022 0959   BUN 6 04/22/2022 0959   BUN 13 09/22/2021 0954   CREATININE 0.70 04/22/2022 0959   CREATININE 0.67 04/23/2015 1645      Component Value Date/Time   CALCIUM 9.1 04/22/2022 0959   ALKPHOS 84 04/22/2022 0959   AST 13 (L) 04/22/2022 0959   ALT 11 04/22/2022 0959   BILITOT 0.3 04/22/2022 0959  RADIOGRAPHIC STUDIES: I have personally reviewed the radiological images as listed and agreed with the findings in the report. IR IMAGING GUIDED PORT INSERTION  Result Date: 04/19/2022 CLINICAL DATA:  Endometrial carcinosarcoma and need for porta cath for chemotherapy. EXAM: IMPLANTED PORT A CATH PLACEMENT WITH ULTRASOUND AND FLUOROSCOPIC GUIDANCE ANESTHESIA/SEDATION: Moderate (conscious) sedation was employed during this procedure. A total of Versed 3.0 mg and Fentanyl 100 mcg was administered intravenously by radiology nursing. Moderate Sedation Time: 37 minutes. The patient's level of consciousness and vital signs were monitored continuously by radiology nursing throughout the procedure under my direct supervision. FLUOROSCOPY: 42 seconds.  4.0 mGy. PROCEDURE: The procedure, risks, benefits, and alternatives were explained to the patient. Questions regarding the procedure were encouraged and answered. The patient understands and consents to the procedure. A time-out was performed prior to initiating the procedure. Ultrasound was utilized to confirm patency of the right internal jugular vein. The right neck and chest were prepped with chlorhexidine in a sterile fashion, and a sterile drape was applied covering the operative field. Maximum barrier sterile technique with sterile gowns and gloves were used for the procedure. Local anesthesia was provided  with 1% lidocaine. After creating a small venotomy incision, a 21 gauge needle was advanced into the right internal jugular vein under direct, real-time ultrasound guidance. Ultrasound image documentation was performed. After securing guidewire access, an 8 Fr dilator was placed. A J-wire was kinked to measure appropriate catheter length. A subcutaneous port pocket was then created along the upper chest wall utilizing sharp and blunt dissection. Portable cautery was utilized. The pocket was irrigated with sterile saline. A single lumen power injectable port was chosen for placement. The 8 Fr catheter was tunneled from the port pocket site to the venotomy incision. The port was placed in the pocket. External catheter was trimmed to appropriate length based on guidewire measurement. At the venotomy, an 8 Fr peel-away sheath was placed over a guidewire. The catheter was then placed through the sheath and the sheath removed. Final catheter positioning was confirmed and documented with a fluoroscopic spot image. The port was accessed with a needle and aspirated and flushed with heparinized saline. The access needle was removed. The venotomy and port pocket incisions were closed with subcutaneous 3-0 Monocryl and subcuticular 4-0 Vicryl. Dermabond was applied to both incisions. COMPLICATIONS: COMPLICATIONS None FINDINGS: After catheter placement, the tip lies at the cavo-atrial junction. The catheter aspirates normally and is ready for immediate use. IMPRESSION: Placement of single lumen port a cath via right internal jugular vein. The catheter tip lies at the cavo-atrial junction. A power injectable port a cath was placed and is ready for immediate use. Electronically Signed   By: Aletta Edouard M.D.   On: 04/19/2022 16:54

## 2022-04-27 ENCOUNTER — Telehealth: Payer: Self-pay

## 2022-04-27 DIAGNOSIS — C541 Malignant neoplasm of endometrium: Secondary | ICD-10-CM

## 2022-04-27 NOTE — Telephone Encounter (Signed)
Pa for pt ( DEXCOM G6 SENSOR ) came through on cover my meds was submitted with office notes and last  labs ... awaiting approval or denial ..      UPDATE :    [facility] has not replied to your PA request. Turnaround time for review of a PA request is dependent upon insurance plan and can range from 24 hours to 5 calendar days

## 2022-04-28 MED FILL — Dexamethasone Sodium Phosphate Inj 100 MG/10ML: INTRAMUSCULAR | Qty: 1 | Status: AC

## 2022-04-28 MED FILL — Fosaprepitant Dimeglumine For IV Infusion 150 MG (Base Eq): INTRAVENOUS | Qty: 5 | Status: AC

## 2022-04-28 NOTE — Telephone Encounter (Signed)
DECISION:       Outcome  Approvedon May 31  PA Case: [id number], Status: Approved,   Coverage Starts on: 04/27/2022 12:00:00 AM, Coverage Ends on: 10/24/2022 12:00:00 AM.   Drug Dexcom G6 Sensor   Form [facility] [place] [employer] [place] Form [phone] NCPDP)      (COPY SENT TO PHARMACY ALSO )

## 2022-04-29 ENCOUNTER — Other Ambulatory Visit: Payer: Self-pay | Admitting: Gynecologic Oncology

## 2022-04-29 ENCOUNTER — Other Ambulatory Visit: Payer: Self-pay

## 2022-04-29 ENCOUNTER — Inpatient Hospital Stay: Payer: Medicaid Other | Attending: Gynecologic Oncology

## 2022-04-29 ENCOUNTER — Telehealth: Payer: Self-pay

## 2022-04-29 VITALS — BP 145/70 | HR 85 | Temp 98.1°F | Resp 17 | Wt 285.8 lb

## 2022-04-29 DIAGNOSIS — D6481 Anemia due to antineoplastic chemotherapy: Secondary | ICD-10-CM | POA: Diagnosis not present

## 2022-04-29 DIAGNOSIS — E1142 Type 2 diabetes mellitus with diabetic polyneuropathy: Secondary | ICD-10-CM | POA: Diagnosis not present

## 2022-04-29 DIAGNOSIS — C541 Malignant neoplasm of endometrium: Secondary | ICD-10-CM | POA: Insufficient documentation

## 2022-04-29 DIAGNOSIS — Z5111 Encounter for antineoplastic chemotherapy: Secondary | ICD-10-CM | POA: Insufficient documentation

## 2022-04-29 DIAGNOSIS — N898 Other specified noninflammatory disorders of vagina: Secondary | ICD-10-CM

## 2022-04-29 MED ORDER — SODIUM CHLORIDE 0.9 % IV SOLN
10.0000 mg | Freq: Once | INTRAVENOUS | Status: AC
  Administered 2022-04-29: 10 mg via INTRAVENOUS
  Filled 2022-04-29: qty 10

## 2022-04-29 MED ORDER — SODIUM CHLORIDE 0.9 % IV SOLN
750.0000 mg | Freq: Once | INTRAVENOUS | Status: AC
  Administered 2022-04-29: 750 mg via INTRAVENOUS
  Filled 2022-04-29: qty 75

## 2022-04-29 MED ORDER — SODIUM CHLORIDE 0.9% FLUSH
10.0000 mL | INTRAVENOUS | Status: DC | PRN
  Administered 2022-04-29: 10 mL

## 2022-04-29 MED ORDER — PALONOSETRON HCL INJECTION 0.25 MG/5ML
0.2500 mg | Freq: Once | INTRAVENOUS | Status: AC
  Administered 2022-04-29: 0.25 mg via INTRAVENOUS
  Filled 2022-04-29: qty 5

## 2022-04-29 MED ORDER — FAMOTIDINE IN NACL 20-0.9 MG/50ML-% IV SOLN
20.0000 mg | Freq: Once | INTRAVENOUS | Status: AC
  Administered 2022-04-29: 20 mg via INTRAVENOUS
  Filled 2022-04-29: qty 50

## 2022-04-29 MED ORDER — SODIUM CHLORIDE 0.9 % IV SOLN
150.0000 mg | Freq: Once | INTRAVENOUS | Status: AC
  Administered 2022-04-29: 150 mg via INTRAVENOUS
  Filled 2022-04-29: qty 150

## 2022-04-29 MED ORDER — DIPHENHYDRAMINE HCL 50 MG/ML IJ SOLN
25.0000 mg | Freq: Once | INTRAMUSCULAR | Status: AC
  Administered 2022-04-29: 25 mg via INTRAVENOUS
  Filled 2022-04-29: qty 1

## 2022-04-29 MED ORDER — SODIUM CHLORIDE 0.9 % IV SOLN: Freq: Once | INTRAVENOUS | Status: AC

## 2022-04-29 MED ORDER — SODIUM CHLORIDE 0.9 % IV SOLN
131.2500 mg/m2 | Freq: Once | INTRAVENOUS | Status: AC
  Administered 2022-04-29: 336 mg via INTRAVENOUS
  Filled 2022-04-29: qty 56

## 2022-04-29 MED ORDER — HEPARIN SOD (PORK) LOCK FLUSH 100 UNIT/ML IV SOLN
500.0000 [IU] | Freq: Once | INTRAVENOUS | Status: AC | PRN
  Administered 2022-04-29: 500 [IU]

## 2022-04-29 MED ORDER — LEVOFLOXACIN 500 MG PO TABS: 500.0000 mg | ORAL_TABLET | Freq: Every day | ORAL | 0 refills | Status: DC

## 2022-04-29 NOTE — Telephone Encounter (Signed)
Per Keane Scrape, front desk at the cancer center, Pt is there and states she was not able to pick up the Flagyl Dr.Tucker prescribed on 5/26 until Monday 5/29. She states she is still experiencing the discharge as discussed at her visit. Pt aware Dr.Tucker or Joylene John NP, will be notified and I will call her with advise.

## 2022-04-29 NOTE — Telephone Encounter (Signed)
Pt is aware Debra John NP will call in [place]. Pt to take both Flagyl and Levoquin. Pt voices an understanding.

## 2022-04-29 NOTE — Progress Notes (Signed)
See CMA note. Patient presented to the front desk reporting continued vaginal discharge. She started flagyl on Monday of this week. Per Dr. Berline Lopes, plan for addition of levaquin 500 mg for a total of 7 days.

## 2022-04-29 NOTE — Patient Instructions (Signed)
[facility] MEDICAL [facility]COLOGY  Discharge Instructions: Thank you for choosing [facility] to provide your oncology and hematology care.   If you have a lab appointment with the Cancer Center, please go directly to the Cancer Center and check in at the registration area.   Wear comfortable clothing and clothing appropriate for easy access to any Portacath or PICC line.   We strive to give you quality time with your provider. You may need to reschedule your appointment if you arrive late (15 or more minutes).  Arriving late affects you and other patients whose appointments are after yours.  Also, if you miss three or more appointments without notifying the office, you may be dismissed from the clinic at the provider's discretion.      For prescription refill requests, have your pharmacy contact our office and allow 72 hours for refills to be completed.    Today you received the following chemotherapy and/or immunotherapy agents: Paclitaxel and Carboplatin      To help prevent nausea and vomiting after your treatment, we encourage you to take your nausea medication as directed.  BELOW ARE SYMPTOMS THAT SHOULD BE REPORTED IMMEDIATELY: *FEVER GREATER THAN 100.4 F (38 C) OR HIGHER *CHILLS OR SWEATING *NAUSEA AND VOMITING THAT IS NOT CONTROLLED WITH YOUR NAUSEA MEDICATION *UNUSUAL SHORTNESS OF BREATH *UNUSUAL BRUISING OR BLEEDING *URINARY PROBLEMS (pain or burning when urinating, or frequent urination) *BOWEL PROBLEMS (unusual diarrhea, constipation, pain near the anus) TENDERNESS IN MOUTH AND THROAT WITH OR WITHOUT PRESENCE OF ULCERS (sore throat, sores in mouth, or a toothache) UNUSUAL RASH, SWELLING OR PAIN  UNUSUAL VAGINAL DISCHARGE OR ITCHING   Items with * indicate a potential emergency and should be followed up as soon as possible or go to the Emergency Department if any problems should occur.  Please show the CHEMOTHERAPY ALERT CARD or IMMUNOTHERAPY ALERT  CARD at check-in to the Emergency Department and triage nurse.  Should you have questions after your visit or need to cancel or reschedule your appointment, please contact [facility]ICAL ONCOLOG[facility]  Dept: [phone]  and follow the prompts.  Office hours are 8:00 a.m. to 4:30 p.m. Monday - Friday. Please note that voicemails left after 4:00 p.m. may not be returned until the following business day.  We are closed weekends and major holidays. You have access to a nurse at all times for urgent questions. Please call the main number to the clinic Dept: [phone] and follow the prompts.   For any non-urgent questions, you may also contact your provider using MyChart. We now offer e-Visits for anyone 18 and older to request care online for non-urgent symptoms. For details visit [email or web address].   Also download the MyChart app! Go to the app store, search "MyChart", open the app, select , and log in with your MyChart username and password.  Due to Covid, a mask is required upon entering the hospital/clinic. If you do not have a mask, one will be given to you upon arrival. For doctor visits, patients may have 1 support person aged 18 or older with them. For treatment visits, patients cannot have anyone with them due to current Covid guidelines and our immunocompromised population.   

## 2022-05-02 ENCOUNTER — Telehealth: Payer: Self-pay | Admitting: Internal Medicine

## 2022-05-02 ENCOUNTER — Telehealth: Payer: Self-pay

## 2022-05-02 ENCOUNTER — Other Ambulatory Visit: Payer: Self-pay | Admitting: Hematology and Oncology

## 2022-05-02 ENCOUNTER — Other Ambulatory Visit (INDEPENDENT_AMBULATORY_CARE_PROVIDER_SITE_OTHER): Payer: Medicaid Other | Admitting: Internal Medicine

## 2022-05-02 DIAGNOSIS — C541 Malignant neoplasm of endometrium: Secondary | ICD-10-CM

## 2022-05-02 DIAGNOSIS — G4700 Insomnia, unspecified: Secondary | ICD-10-CM

## 2022-05-02 MED ORDER — TRAZODONE HCL 50 MG PO TABS: 50.0000 mg | ORAL_TABLET | Freq: Every evening | ORAL | 1 refills | Status: DC | PRN

## 2022-05-02 MED ORDER — TRAMADOL HCL 50 MG PO TABS: 50.0000 mg | ORAL_TABLET | Freq: Four times a day (QID) | ORAL | 0 refills | Status: DC | PRN

## 2022-05-02 NOTE — Telephone Encounter (Signed)
Returned her call. She is crying and upset. Yesterday she started aching/ having pain all over. The pain is so bad that she is having a hard time walking. She had 1 tramadol left from surgery that she took during the night. She got a little relief. She has tried tylenol with no relief of the pain. She was unable to sleep last night. Denies constipation. She is able to eat and drink.  She asking for prescription pain Rx if possible to [facility] in [place].

## 2022-05-02 NOTE — Telephone Encounter (Signed)
Done I suggest she takes each dose of tramadol with tylenol 650 mg (2 tabs of regular tylenol 325 mg) Call back Thursday if pain not resolved

## 2022-05-02 NOTE — Telephone Encounter (Signed)
Return pt's call. Pt was asked if she had tried any OTC sleep med. She stated Melatonin does not work for her. Stated she has been taking [employer] brand Diphenhydramine which worked at first but now she's taking 6 -7 tabs and still wakes up during the night. And her Oncologist told her to call her PCP.

## 2022-05-02 NOTE — Progress Notes (Signed)
Called patient back about insomnia.  [name] is a 61yo person living with carcinosarcoma of endometrium s/p total hysterectomy currently undergoing chemotherapy.  She states that she's had insomnia since going into menopause over 10 years. STOP-Bang score is low at 2 (fatigue and HTN treatment). She states that she has been taking melatonin and diphenhydramine without relief. She is able to fall asleep, but finds herself waking up multiple times a night. She reports that she sleeps with the TV on.  A/P: I talked with patient about sleep hygiene interventions including turning off TV to reduce light in room and trying white noise machine. She endorsed increased stress and anxiety with recent diagnosis, surgery and current chemo treatment. I will send in trazodone 50 mg qhs PRN to patients pharmacy and update problem list. She has follow-up in [facility] set up for later this month. Could consider starting SSRI of screening indicated significant anxiety component.

## 2022-05-02 NOTE — Assessment & Plan Note (Signed)
Called patient back about insomnia.  [name] is a 61yo person living with carcinosarcoma of endometrium s/p total hysterectomy currently undergoing chemotherapy.  She states that she's had insomnia since going into menopause over 10 years. STOP-Bang score is low at 2 (fatigue and HTN treatment). She states that she has been taking melatonin and diphenhydramine without relief. She is able to fall asleep, but finds herself waking up multiple times a night. She reports that she sleeps with the TV on.  A/P: I talked with patient about sleep hygiene interventions including turning off TV to reduce light in room and trying white noise machine. She endorsed increased stress and anxiety with recent diagnosis, surgery and current chemo treatment. I will send in trazodone 50 mg qhs PRN to patients pharmacy and update problem list. She has follow-up in [facility] set up for later this month. Could consider starting SSRI of screening indicated significant anxiety component.

## 2022-05-02 NOTE — Telephone Encounter (Signed)
Called and given below message. She verbalized understanding. 

## 2022-05-02 NOTE — Telephone Encounter (Signed)
Pt is asking for a Nurse to call her back.  Pt states she has already contacted her Oncologist about not being able to sleep.    Pt states she was instructed to contact her PCP about getting a prescription.

## 2022-05-04 ENCOUNTER — Telehealth: Payer: Medicaid Other | Admitting: Gynecologic Oncology

## 2022-05-05 NOTE — Progress Notes (Signed)
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 SENTINEL LYMPH NODE, RIGHT OBTURATOR, EXCISION:  - Lymph node, negative for carcinoma (0/1)   B. 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 LYMPH NODES, LEFT PELVIC, RESECTION:  - Six lymph nodes, negative for carcinoma (0/6)   Past/Anticipated interventions by Gyn/Onc surgery, if any: Robotic-assisted laparoscopic total hysterectomy with bilateral salpingoophorectomy, SLN biopsy on the right, left pelvic lymphadenectomy, lysis of adhesions by Dr. Berline Lopes on 03/30/22.  Past/Anticipated interventions by medical oncology, if any: 6 cycles carboplatin/Taxol   Weight changes, if any:   Bowel/Bladder complaints, if any: ,   Nausea/Vomiting, if any:   Pain issues, if any:    SAFETY ISSUES: Prior radiation?  Pacemaker/ICD?  Possible current pregnancy? no Is the patient on methotrexate?   Current Complaints / other details:

## 2022-05-09 ENCOUNTER — Other Ambulatory Visit: Payer: Self-pay | Admitting: Oncology

## 2022-05-09 ENCOUNTER — Ambulatory Visit: Payer: Medicaid Other

## 2022-05-09 ENCOUNTER — Ambulatory Visit
Admission: RE | Admit: 2022-05-09 | Discharge: 2022-05-09 | Disposition: A | Discharge status: Home / Self Care | Payer: Medicaid Other | Source: Ambulatory Visit | Attending: Radiation Oncology | Admitting: Radiation Oncology

## 2022-05-09 NOTE — Progress Notes (Signed)
Gynecologic Oncology Multi-Disciplinary Disposition Conference Note  Date of the Conference: 05/09/2022  Patient Name: [name]  Referring Provider: Dr. Jodi Mourning Primary GYN Oncologist: Dr. 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 Disposition is to adjuvant treatment including chemotherapy and vaginal brachytherapy.   This Multidisciplinary conference took place involving physicians from [place], [place], Radiation Oncology, Pathology, Radiology along with the Gynecologic Oncology Nurse Practitioner and RN.  Comprehensive assessment of the patient's malignancy, staging, need for surgery, chemotherapy, radiation therapy, and need for further testing were reviewed. Supportive measures, both inpatient and following discharge were also discussed. The recommended plan of care is documented. Greater than 35 minutes were spent correlating and coordinating this patient's care. This Multidisciplinary conference took place involving physicians from [place], [place], Radiation Oncology, Pathology, Radiology along with the Gynecologic Oncology Nurse Practitioner and RN.  Comprehensive assessment of the patient's malignancy, staging, need for surgery, chemotherapy, radiation therapy, and need for further testing were reviewed. Supportive measures, both inpatient and following discharge were also discussed. The recommended plan of care is documented. Greater than 35 minutes were spent correlating and coordinating this patient's care.

## 2022-05-18 ENCOUNTER — Ambulatory Visit (INDEPENDENT_AMBULATORY_CARE_PROVIDER_SITE_OTHER): Payer: Medicaid Other | Admitting: Internal Medicine

## 2022-05-18 ENCOUNTER — Other Ambulatory Visit: Payer: Self-pay

## 2022-05-18 ENCOUNTER — Encounter: Payer: Self-pay | Admitting: Hematology and Oncology

## 2022-05-18 ENCOUNTER — Encounter: Payer: Self-pay | Admitting: Internal Medicine

## 2022-05-18 VITALS — BP 129/74 | HR 101 | Temp 98.2°F | Resp 24 | Ht 73.0 in | Wt 282.7 lb

## 2022-05-18 DIAGNOSIS — C541 Malignant neoplasm of endometrium: Secondary | ICD-10-CM

## 2022-05-18 DIAGNOSIS — Z87891 Personal history of nicotine dependence: Secondary | ICD-10-CM | POA: Diagnosis not present

## 2022-05-18 DIAGNOSIS — Z794 Long term (current) use of insulin: Secondary | ICD-10-CM

## 2022-05-18 DIAGNOSIS — E1142 Type 2 diabetes mellitus with diabetic polyneuropathy: Secondary | ICD-10-CM

## 2022-05-18 DIAGNOSIS — I10 Essential (primary) hypertension: Secondary | ICD-10-CM | POA: Diagnosis not present

## 2022-05-18 DIAGNOSIS — Z0000 Encounter for general adult medical examination without abnormal findings: Secondary | ICD-10-CM

## 2022-05-18 DIAGNOSIS — Z1211 Encounter for screening for malignant neoplasm of colon: Secondary | ICD-10-CM

## 2022-05-18 MED ORDER — INSULIN LISPRO 100 UNIT/ML IJ SOLN: INTRAMUSCULAR | 3 refills | Status: DC

## 2022-05-18 MED FILL — Fosaprepitant Dimeglumine For IV Infusion 150 MG (Base Eq): INTRAVENOUS | Qty: 5 | Status: AC

## 2022-05-18 MED FILL — Dexamethasone Sodium Phosphate Inj 100 MG/10ML: INTRAMUSCULAR | Qty: 1 | Status: AC

## 2022-05-18 NOTE — Progress Notes (Signed)
Received voicemail from patient after receiving my card per my request.  Called patient to introduce myself as Arboriculturist and to offer available resources. Discussed one-time $1000 Radio broadcast assistant to assist with personal expenses while going through treatment. Advised what is needed to apply. She will bring tomorrow at 6/22 and provide to registrar whom will scan and email to me and provide her with paperwork to complete and receive a copy of. Advised her to contact me at her earliest convenience after appointment and we will discuss grant information in detail.  She has my card for any additional financial questions or concerns.

## 2022-05-18 NOTE — Progress Notes (Signed)
 CC: follow up  HPI:  [name] is a 61 y.o. with medical history as below presenting to [facility] for follow up.   Please see problem-based list for further details, assessments, and plans.  Past Medical History:  Diagnosis Date   Anemia    Asthma 11/01/2010   only when respiratory infections   BPPV (benign paroxysmal positional vertigo) 03/07/2013   Diabetes mellitus    since 1983   Elevated TSH 05/13/2013   Endometrial cancer (HCC)    GERD (gastroesophageal reflux disease)    Hypertension    Left elbow pain 05/05/2016   Left shoulder pain 05/05/2016   Osteomyelitis (HCC)    Pneumonia    as a child   Wrist pain 04/11/2013    Current Outpatient Medications (Endocrine & Metabolic):    insulin lispro (HUMALOG) 100 UNIT/ML injection, Inject 16-18 units with breakfast and lunch and 14 units with dinner.   dexamethasone (DECADRON) 4 MG tablet, Take 2 tabs at the night before chemotherapy, every 3 weeks, by mouth x 6 cycles   insulin glargine (LANTUS SOLOSTAR) 100 UNIT/ML Solostar Pen, Inject 80 Units into the skin at bedtime.   metFORMIN (GLUCOPHAGE-XR) 500 MG 24 hr tablet, Take 4 tablets (2,000 mg total) by mouth at bedtime.   Semaglutide, 2 MG/DOSE, 8 MG/3ML SOPN, Inject 2 mg into the skin once a week.  Current Outpatient Medications (Cardiovascular):    atorvastatin (LIPITOR) 80 MG tablet, Take 1 tablet (80 mg total) by mouth daily.   lisinopril (ZESTRIL) 20 MG tablet, Take 1 tablet (20 mg total) by mouth daily.   Current Outpatient Medications (Analgesics):    acetaminophen (TYLENOL) 650 MG CR tablet, Take 1,300-1,950 mg by mouth every 8 (eight) hours as needed for pain.   traMADol (ULTRAM) 50 MG tablet, Take 1 tablet (50 mg total) by mouth every 6 (six) hours as needed for severe pain.   Current Outpatient Medications (Other):    ACCU-CHEK GUIDE test strip, USE UP TO FOUR TIMES DAILY AS DIRECTED   blood glucose meter kit and supplies, Dispense based on patient  and insurance preference. Use up to four times daily as directed. (FOR ICD-10 E10.9, E11.9).   Continuous Blood Gluc Transmit (DEXCOM G6 TRANSMITTER) MISC, Check blood sugar at least 8 times a day   diphenhydrAMINE HCl, Sleep, 50 MG CAPS, Take 150 mg by mouth at bedtime.   gabapentin (NEURONTIN) 300 MG capsule, Take 3 capsules (900 mg total) by mouth 3 (three) times daily.   Insulin Pen Needle 31G X 5 MM MISC, Use as directed with Lantus and Humalog   Lancets (ONETOUCH DELICA PLUS LANCET33G) MISC, Check blood sugar 3 times a day   levofloxacin (LEVAQUIN) 500 MG tablet, Take 1 tablet (500 mg total) by mouth daily. Continue taking flagyl as well   lidocaine-prilocaine (EMLA) cream, Apply to affected area once (Patient not taking: Reported on 04/14/2022)   meclizine (ANTIVERT) 25 MG tablet, Take 1 tablet (25 mg total) by mouth 3 (three) times daily as needed for dizziness.   Multiple Vitamin (MULTIVITAMIN WITH MINERALS) TABS tablet, Take 1 tablet by mouth every evening.    omeprazole (PRILOSEC) 20 MG capsule, Take 1 capsule (20 mg total) by mouth daily. (Patient taking differently: Take 20 mg by mouth daily as needed (acid reflux).)   ondansetron (ZOFRAN) 8 MG tablet, Take 1 tablet (8 mg total) by mouth 2 (two) times daily as needed. Start on the third day after chemotherapy. (Patient not taking: Reported on 04/14/2022)     prochlorperazine (COMPAZINE) 10 MG tablet, Take 1 tablet (10 mg total) by mouth every 6 (six) hours as needed (Nausea or vomiting). (Patient not taking: Reported on 04/14/2022)   senna-docusate (SENOKOT-S) 8.6-50 MG tablet, Take 2 tablets by mouth at bedtime. For AFTER surgery, do not take if having diarrhea   traZODone (DESYREL) 50 MG tablet, Take 1 tablet (50 mg total) by mouth at bedtime as needed for sleep.  Review of Systems:  Review of system negative unless stated in the problem list or HPI.    Physical Exam:  Vitals:   05/18/22 1015 05/18/22 1146  BP: (!) 160/80 129/74   Pulse: (!) 105 (!) 101  Resp: (!) 24   Temp: 98.2 F (36.8 C)   TempSrc: Oral   SpO2: 100%   Weight: 282 lb 11.2 oz (128.2 kg)   Height: 6' 1" (1.854 m)     Physical Exam General: NAD HENT: NCAT Lungs: CTAB, no wheeze, rhonchi or rales.  Cardiovascular: Normal heart sounds, no r/m/g, 2+ pulses in all extremities. No LE edema Abdomen: No TTP, normal bowel sounds MSK: No asymmetry or muscle atrophy.  Skin: no lesions noted on exposed skin Neuro: Alert and oriented x4. CN grossly intact Psych: Normal mood and normal affect   Assessment & Plan:   See Encounters Tab for problem based charting.  Patient discussed with Dr. Denzil Magnuson, MD

## 2022-05-18 NOTE — Patient Instructions (Addendum)
[name], it was a pleasure seeing you today! You endorsed feeling well today. Below are some of the things we talked about this visit. We look forward to seeing you in the follow up appointment!  Today we discussed: You presented for a follow-up.  You recently had a surgery for your uterine cancer and are following with gynecology and oncology.  Please continue with your chemotherapy and continue following with those providers. Your sugars have been running low into the 40s so we will adjust your medications.  We will continue the Lantus 80 units nightly and decrease your mealtime insulin to 16 to 18 units with breakfast and lunch and 14 units with dinner.  Please make sure you eat a sandwich before going to bed.  Please wear your Dexcom on the back of your arm so we can track your sugars. For your blood pressure we will continue the lisinopril 20 mg.  If you are able to get a blood pressure monitor please get one and monitor blood pressure 2 times daily.  We will follow-up in 2 weeks for your sugars and your blood pressure and make any medication changes that we need to make.  I have ordered the following labs today:  Lab Orders  No laboratory test(s) ordered today      Referrals ordered today:   Referral Orders         Ambulatory referral to Gastroenterology       I have ordered the following medication/changed the following medications:   Stop the following medications: Medications Discontinued During This Encounter  Medication Reason   insulin lispro (HUMALOG) 100 UNIT/ML KwikPen Duplicate     Start the following medications: Meds ordered this encounter  Medications   insulin lispro (HUMALOG) 100 UNIT/ML injection    Sig: Inject 16-18 units with breakfast and lunch and 14 units with dinner.    Dispense:  10 mL    Refill:  3     Follow-up: 2 week follow up for high blood pressure and diabetes  Please make sure to arrive 15 minutes prior to your next appointment.  If you arrive late, you may be asked to reschedule.   We look forward to seeing you next time. Please call our clinic at [phone] if you have any questions or concerns. The best time to call is Monday-Friday from 9am-4pm, but there is someone available 24/7. If after hours or the weekend, call the main hospital number and ask for the Internal Medicine Resident On-Call. If you need medication refills, please notify your pharmacy one week in advance and they will send [name] a request.  Thank you for letting us take part in your care. Wishing you the best!  Thank you, Idamae Schuller, MD

## 2022-05-19 ENCOUNTER — Encounter: Payer: Self-pay | Admitting: Hematology and Oncology

## 2022-05-19 ENCOUNTER — Inpatient Hospital Stay: Payer: Medicaid Other

## 2022-05-19 ENCOUNTER — Inpatient Hospital Stay (HOSPITAL_BASED_OUTPATIENT_CLINIC_OR_DEPARTMENT_OTHER): Payer: Medicaid Other | Admitting: Hematology and Oncology

## 2022-05-19 ENCOUNTER — Encounter: Payer: Medicaid Other | Admitting: Gynecologic Oncology

## 2022-05-19 DIAGNOSIS — C541 Malignant neoplasm of endometrium: Secondary | ICD-10-CM

## 2022-05-19 DIAGNOSIS — D6481 Anemia due to antineoplastic chemotherapy: Secondary | ICD-10-CM | POA: Diagnosis not present

## 2022-05-19 DIAGNOSIS — Z5111 Encounter for antineoplastic chemotherapy: Secondary | ICD-10-CM | POA: Diagnosis not present

## 2022-05-19 DIAGNOSIS — E1340 Other specified diabetes mellitus with diabetic neuropathy, unspecified: Secondary | ICD-10-CM

## 2022-05-19 LAB — CBC WITH DIFFERENTIAL (CANCER CENTER ONLY)
Abs Immature Granulocytes: 0.01 10*3/uL (ref 0.00–0.07)
Basophils Absolute: 0 10*3/uL (ref 0.0–0.1)
Basophils Relative: 0 %
Eosinophils Absolute: 0.1 10*3/uL (ref 0.0–0.5)
Eosinophils Relative: 1 %
HCT: 32.6 % — ABNORMAL LOW (ref 36.0–46.0)
Hemoglobin: 10.4 g/dL — ABNORMAL LOW (ref 12.0–15.0)
Immature Granulocytes: 0 %
Lymphocytes Relative: 44 %
Lymphs Abs: 2.2 10*3/uL (ref 0.7–4.0)
MCH: 30.5 pg (ref 26.0–34.0)
MCHC: 31.9 g/dL (ref 30.0–36.0)
MCV: 95.6 fL (ref 80.0–100.0)
Monocytes Absolute: 0.3 10*3/uL (ref 0.1–1.0)
Monocytes Relative: 7 %
Neutro Abs: 2.3 10*3/uL (ref 1.7–7.7)
Neutrophils Relative %: 48 %
Platelet Count: 222 10*3/uL (ref 150–400)
RBC: 3.41 MIL/uL — ABNORMAL LOW (ref 3.87–5.11)
RDW: 13.7 % (ref 11.5–15.5)
WBC Count: 4.8 10*3/uL (ref 4.0–10.5)
nRBC: 0 % (ref 0.0–0.2)

## 2022-05-19 LAB — CMP (CANCER CENTER ONLY)
ALT: 14 U/L (ref 0–44)
AST: 16 U/L (ref 15–41)
Albumin: 4.1 g/dL (ref 3.5–5.0)
Alkaline Phosphatase: 77 U/L (ref 38–126)
Anion gap: 7 (ref 5–15)
BUN: 9 mg/dL (ref 6–20)
CO2: 28 mmol/L (ref 22–32)
Calcium: 9.5 mg/dL (ref 8.9–10.3)
Chloride: 109 mmol/L (ref 98–111)
Creatinine: 0.55 mg/dL (ref 0.44–1.00)
GFR, Estimated: >60 mL/min (ref >60)
Glucose, Bld: 83 mg/dL (ref 70–99)
Potassium: 3.7 mmol/L (ref 3.5–5.1)
Sodium: 144 mmol/L (ref 135–145)
Total Bilirubin: 0.3 mg/dL (ref 0.3–1.2)
Total Protein: 7.1 g/dL (ref 6.5–8.1)

## 2022-05-19 MED ORDER — PALONOSETRON HCL INJECTION 0.25 MG/5ML
0.2500 mg | Freq: Once | INTRAVENOUS | Status: AC
  Administered 2022-05-19: 0.25 mg via INTRAVENOUS
  Filled 2022-05-19: qty 5

## 2022-05-19 MED ORDER — SODIUM CHLORIDE 0.9 % IV SOLN
750.0000 mg | Freq: Once | INTRAVENOUS | Status: AC
  Administered 2022-05-19: 750 mg via INTRAVENOUS
  Filled 2022-05-19: qty 75

## 2022-05-19 MED ORDER — HEPARIN SOD (PORK) LOCK FLUSH 100 UNIT/ML IV SOLN
500.0000 [IU] | Freq: Once | INTRAVENOUS | Status: AC | PRN
  Administered 2022-05-19: 500 [IU]

## 2022-05-19 MED ORDER — SODIUM CHLORIDE 0.9 % IV SOLN
10.0000 mg | Freq: Once | INTRAVENOUS | Status: AC
  Administered 2022-05-19: 10 mg via INTRAVENOUS
  Filled 2022-05-19: qty 10

## 2022-05-19 MED ORDER — FAMOTIDINE IN NACL 20-0.9 MG/50ML-% IV SOLN
20.0000 mg | Freq: Once | INTRAVENOUS | Status: AC
  Administered 2022-05-19: 20 mg via INTRAVENOUS
  Filled 2022-05-19: qty 50

## 2022-05-19 MED ORDER — SODIUM CHLORIDE 0.9% FLUSH
10.0000 mL | INTRAVENOUS | Status: DC | PRN
  Administered 2022-05-19: 10 mL

## 2022-05-19 MED ORDER — DIPHENHYDRAMINE HCL 50 MG/ML IJ SOLN
25.0000 mg | Freq: Once | INTRAMUSCULAR | Status: AC
  Administered 2022-05-19: 25 mg via INTRAVENOUS
  Filled 2022-05-19: qty 1

## 2022-05-19 MED ORDER — SODIUM CHLORIDE 0.9 % IV SOLN: Freq: Once | INTRAVENOUS | Status: AC

## 2022-05-19 MED ORDER — SODIUM CHLORIDE 0.9 % IV SOLN
150.0000 mg | Freq: Once | INTRAVENOUS | Status: AC
  Administered 2022-05-19: 150 mg via INTRAVENOUS
  Filled 2022-05-19: qty 150

## 2022-05-19 MED ORDER — SODIUM CHLORIDE 0.9 % IV SOLN
131.2500 mg/m2 | Freq: Once | INTRAVENOUS | Status: AC
  Administered 2022-05-19: 336 mg via INTRAVENOUS
  Filled 2022-05-19: qty 56

## 2022-05-19 NOTE — Assessment & Plan Note (Addendum)
She has pre-existing peripheral neuropathy due to diabetes I recommend upfront dose reduction of paclitaxel, we will continue the same 

## 2022-05-19 NOTE — Assessment & Plan Note (Addendum)
Patient's DMII is uncontrolled but improving. A1c 7.8 two months ago. Home monitoring with fasting glucose ranging 80-110. Current regimen includes Lantus 80 units nightly, lispro 20-22 units with meals, metformin 2000 mg qd, and ozempic 2 gm weekly. She is on gabapentin 900 mg TID. Reports good medication adherence.  She reports an unproductive experience during a prior visit where she did not get any adjustments made to her regimen and she had to self adjust.  I attentively listened to her and apologize for her experience.  She has been having some hypoglycemic readings during early mornings with lowest one reported in the 2s.  This has occurred 3 times in the last 2 weeks.  She has a Dexcom but is not wearing it as she had recent abdominal surgery but I advised her she can wear it on the back of her arm.  She states the hypoglycemic episodes occurs if she does not eat later in the evening or eats a small dinner.  Overall she is improving on current regimen. Tolerating medication without adverse effects. Counseled on the benefits of daily exercise, limiting processed foods and high sugar foods, and weight loss. She is denying polydipsia, polyuria, weight loss, lethargy, blurry vision, and changes in sensation. Benign physical exam and vitals wnl.   -Continue Lantus 80 units nightly, decrease lispro 16 to 18 units with breakfast and lunch, and 14 units with dinner. -Continue lifestyle modifications

## 2022-05-19 NOTE — Progress Notes (Signed)
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 The patient denies recent history of bleeding such as epistaxis, hematuria or hematochezia. She is asymptomatic from the anemia. I will observe for now.  She does not require transfusion now. I will continue the chemotherapy at current dose without dosage adjustment.  If the anemia gets progressive worse in the future, I might have to delay her treatment or adjust the chemotherapy dose.   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   All questions were answered. The patient knows to call the clinic with any problems, questions or concerns. The total time spent in the appointment was 20 minutes encounter with patients including review of chart and various tests results, discussions about plan of care and coordination of care plan   Artis Delay, MD 05/19/2022 9:53 AM  INTERVAL HISTORY: Please see below for problem oriented charting. she returns for treatment follow-up seen prior to cycle 2 of carboplatin and paclitaxel She had severe bone pain after treatment that resolved with pain medicine She denies worsening peripheral neuropathy Denies nausea or recent constipation  REVIEW OF SYSTEMS:   Constitutional: Denies fevers, chills or abnormal weight loss Eyes: Denies blurriness of vision Ears, nose, mouth, throat, and face: Denies mucositis  or sore throat Respiratory: Denies cough, dyspnea or wheezes Cardiovascular: Denies palpitation, chest discomfort or lower extremity swelling Gastrointestinal:  Denies nausea, heartburn or change in bowel habits Skin: Denies abnormal skin rashes Lymphatics: Denies new lymphadenopathy or easy bruising Neurological:Denies numbness, tingling or new weaknesses Behavioral/Psych: Mood is stable, no new changes  All other systems were reviewed with the patient and are negative.  I have reviewed the past medical history, past surgical history, social history and family history with the patient and they are unchanged from previous note.  ALLERGIES:  is allergic to nsaids and penicillins.  MEDICATIONS:  Current Outpatient Medications  Medication Sig Dispense Refill   ACCU-CHEK GUIDE test strip USE UP TO FOUR TIMES DAILY AS DIRECTED 100 each 3   acetaminophen (TYLENOL) 650 MG CR tablet Take 1,300-1,950 mg by mouth every 8 (eight) hours as needed for pain.     atorvastatin (LIPITOR) 80 MG tablet Take 1 tablet (80 mg total) by mouth daily. 90 tablet 3   blood glucose meter kit and supplies Dispense based on patient and insurance preference. Use up to four times daily as directed. (FOR ICD-10 E10.9, E11.9). 1 each 0   Continuous Blood Gluc Transmit (DEXCOM G6 TRANSMITTER) MISC Check blood sugar at least 8 times a day 1 each 3   dexamethasone (DECADRON) 4 MG tablet Take 2 tabs at the night before chemotherapy, every 3 weeks, by mouth x 6 cycles 12 tablet 6   diphenhydrAMINE HCl, Sleep, 50 MG CAPS Take 150 mg by mouth at bedtime.     gabapentin (NEURONTIN) 300 MG capsule Take 3 capsules (900 mg total) by mouth 3 (three) times daily. 270 capsule 6  insulin glargine (LANTUS SOLOSTAR) 100 UNIT/ML Solostar Pen Inject 80 Units into the skin at bedtime. 24 mL 2   insulin lispro (HUMALOG) 100 UNIT/ML injection Inject 16-18 units with breakfast and lunch and 14 units with dinner. 10 mL 3   Insulin Pen Needle 31G  X 5 MM MISC Use as directed with Lantus and Humalog 100 each PRN   Lancets (ONETOUCH DELICA PLUS LANCET33G) MISC Check blood sugar 3 times a day 300 each 3   levofloxacin (LEVAQUIN) 500 MG tablet Take 1 tablet (500 mg total) by mouth daily. Continue taking flagyl as well 7 tablet 0   lidocaine-prilocaine (EMLA) cream Apply to affected area once (Patient not taking: Reported on 04/14/2022) 30 g 3   lisinopril (ZESTRIL) 20 MG tablet Take 1 tablet (20 mg total) by mouth daily. 30 tablet 11   meclizine (ANTIVERT) 25 MG tablet Take 1 tablet (25 mg total) by mouth 3 (three) times daily as needed for dizziness. 60 tablet 1   metFORMIN (GLUCOPHAGE-XR) 500 MG 24 hr tablet Take 4 tablets (2,000 mg total) by mouth at bedtime. 360 tablet 3   Multiple Vitamin (MULTIVITAMIN WITH MINERALS) TABS tablet Take 1 tablet by mouth every evening.      omeprazole (PRILOSEC) 20 MG capsule Take 1 capsule (20 mg total) by mouth daily. (Patient taking differently: Take 20 mg by mouth daily as needed (acid reflux).) 30 capsule 1   ondansetron (ZOFRAN) 8 MG tablet Take 1 tablet (8 mg total) by mouth 2 (two) times daily as needed. Start on the third day after chemotherapy. (Patient not taking: Reported on 04/14/2022) 30 tablet 1   prochlorperazine (COMPAZINE) 10 MG tablet Take 1 tablet (10 mg total) by mouth every 6 (six) hours as needed (Nausea or vomiting). (Patient not taking: Reported on 04/14/2022) 30 tablet 1   Semaglutide, 2 MG/DOSE, 8 MG/3ML SOPN Inject 2 mg into the skin once a week. 10 mL 3   senna-docusate (SENOKOT-S) 8.6-50 MG tablet Take 2 tablets by mouth at bedtime. For AFTER surgery, do not take if having diarrhea 30 tablet 0   traMADol (ULTRAM) 50 MG tablet Take 1 tablet (50 mg total) by mouth every 6 (six) hours as needed for severe pain. 30 tablet 0   traZODone (DESYREL) 50 MG tablet Take 1 tablet (50 mg total) by mouth at bedtime as needed for sleep. 30 tablet 1   No current facility-administered medications for  this visit.   Facility-Administered Medications Ordered in Other Visits  Medication Dose Route Frequency Provider Last Rate Last Admin   CARBOplatin (PARAPLATIN) 750 mg in sodium chloride 0.9 % 250 mL chemo infusion  750 mg Intravenous Once Bertis Ruddy, Edenilson Austad, MD       dexamethasone (DECADRON) 10 mg in sodium chloride 0.9 % 50 mL IVPB  10 mg Intravenous Once Bertis Ruddy, Lillion Elbert, MD       famotidine (PEPCID) IVPB 20 mg premix  20 mg Intravenous Once Bertis Ruddy, Edsel Shives, MD 200 mL/hr at 05/19/22 0943 20 mg at 05/19/22 0943   fosaprepitant (EMEND) 150 mg in sodium chloride 0.9 % 145 mL IVPB  150 mg Intravenous Once Bertis Ruddy, Kelena Garrow, MD       heparin lock flush 100 unit/mL  500 Units Intracatheter Once PRN Bertis Ruddy, Artie Takayama, MD       PACLitaxel (TAXOL) 336 mg in sodium chloride 0.9 % 500 mL chemo infusion (> 80mg /m2)  131.25 mg/m2 (Treatment Plan Recorded) Intravenous Once Bertis Ruddy, Kairos Panetta, MD       sodium chloride flush (NS)  0.9 % injection 10 mL  10 mL Intracatheter PRN Artis Delay, MD        SUMMARY OF ONCOLOGIC HISTORY: Oncology History Overview Note  P53 wild type   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(HCC)  02/01/2022 Initial Diagnosis   She presented with postmenopausal bleeding   02/07/2022 Imaging   US pelvis Thickened heterogeneous endometrium with masslike region with internal vascularity. Findings may be associated with endometrial carcinoma or hyperplasia. Biopsy is recommended for further evaluation.   02/22/2022 Initial Biopsy   EMB: MMMT/carcinosarcoma (predominance of sarcomatous component)   03/05/2022 Imaging   Ct chest, abdomen and pelvis 1. Heterogeneous enlargement of the endometrial cavity is compatible with the reported history of uterine carcinosarcoma. No definite involvement of the posterior bladder wall or sigmoid colon/rectum. 2. 9 mm short axis right external iliac node is upper normal for size. Attention on follow-up recommended. Otherwise no lymphadenopathy in the abdomen or pelvis. 3. 3.6 x 2.7 cm lesion  in the lateral segment left liver has subtle peripheral nodular enhancement. This is almost certainly a benign cavernous hemangioma, but MRI abdomen with and without contrast recommended to confirm. 4. Aortic Atherosclerosis (ICD10-I70.0).   03/08/2022 Initial Diagnosis   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(HCC)   03/30/2022 Surgery   TRH/BSO, SLN biopsy on right, left pelvic LND, LOS, mini-lap  Findings: On EUA, 12cm minimally mobile uterus. On intra-abdominal exam, normal upper abdominal survey. Omentum adherent to the anterior abdominal wall along the prior midline incision. Normal omentum otherwise, normal small and large bowel. 12 cm uterus densely adherent to the anterior abdominal wall, obliterating some of the anterior anatomy including the anterior cul de sac. Normal appearing bilateral adnexa. Mapping successful to right obturator SLN, mildly enlarged. Dye seen within the parametrium on the left, no SLNs identified. No obvious adenopathy on the left. Decision made given length of surgery, comorbitidies to defer left para-aortic lymphadenectomy. Mini-lap required for specimen delivery. Dome intact on cystoscopy and good efflux noted from bilateral ureteral orifices.   03/30/2022 Pathology Results   MMMT/carcinosarcoma, 6.3 cm MI 1cm or 3 (<50%) Cervical stroma, bilateral tubes/ovaries benign R SLN and L pelvic LNDs negative  ONCOLOGY TABLE:   UTERUS, CARCINOMA OR CARCINOSARCOMA: Resection   Procedure: Total hysterectomy and bilateral salpingo-oophorectomy  Histologic Type:  Malignant mixed Mullerian tumor (MMMT/ carcinosarcoma)  Histologic Grade: High-grade  Myometrial Invasion:       Depth of Myometrial Invasion (mm): 10 mm       Myometrial Thickness (mm): 30 mm       Percentage of Myometrial Invasion: 33%  Uterine Serosa Involvement: Not identified  Cervical stromal Involvement: Not identified  Extent of involvement of other tissue/organs: Not identified  Peritoneal/Ascitic Fluid:  Not applicable  Lymphovascular Invasion: Not identified  Regional Lymph Nodes:       Pelvic Lymph Nodes Examined:                                   1 Sentinel                                   6 Non-sentinel                                   7 Total       Pelvic Lymph Nodes  with Metastasis: 0                           Macrometastasis: (>2.0 mm): 0                           Micrometastasis: (>0.2 mm and < 2.0 mm): 0                           Isolated Tumor Cells (<0.2 mm): 0                           Laterality of Lymph Node with Tumor: Not  applicable                           Extracapsular Extension: Not applicable       Para-aortic Lymph Nodes Examined:                                    0 Sentinel                                    0 non-sentinel                                    0 total  Distant Metastasis:       Distant Site(s) Involved: Not applicable  Pathologic Stage Classification (pTNM, AJCC 8th Edition): pT1a, pN0  Ancillary Studies: MMR / MSI testing will be ordered  Representative Tumor Block: B1  Comment(s): Pancytokeratin was performed on the lymph nodes and is  negative.    04/08/2022 Cancer Staging   Staging form: Corpus Uteri - Carcinoma and Carcinosarcoma, AJCC 8th Edition - Pathologic stage from 04/08/2022: FIGO Stage IA (pT1a, pN0, cM0) - Signed by Artis Delay, MD on 04/08/2022 Stage prefix: Initial diagnosis   04/21/2022 Procedure   Placement of single lumen port a cath via right internal jugular vein. The catheter tip lies at the cavo-atrial junction. A power injectable port a cath was placed and is ready for immediate use.       04/29/2022 -  Chemotherapy   Patient is on Treatment Plan : UTERINE Carboplatin AUC 6 / Paclitaxel q21d       PHYSICAL EXAMINATION: ECOG PERFORMANCE STATUS: 1 - Symptomatic but completely ambulatory  Vitals:   05/19/22 0848  BP: 137/71  Pulse: 99  Resp: 18  Temp: (!) 97.4 F (36.3 C)  SpO2: 100%   Filed Weights    05/19/22 0848  Weight: 282 lb (127.9 kg)    GENERAL:alert, no distress and comfortable NEURO: alert & oriented x 3 with fluent speech, no focal motor/sensory deficits  LABORATORY DATA:  I have reviewed the data as listed    Component Value Date/Time   NA 144 05/19/2022 0830   NA 139 09/22/2021 0954   K 3.7 05/19/2022 0830   CL 109 05/19/2022 0830   CO2 28 05/19/2022 0830   GLUCOSE 83 05/19/2022 0830   BUN 9 05/19/2022 0830   BUN 13 09/22/2021 0954   CREATININE 0.55 05/19/2022 0830   CREATININE 0.67 04/23/2015 1645  CALCIUM 9.5 05/19/2022 0830   PROT 7.1 05/19/2022 0830   PROT 7.3 09/22/2021 0954   ALBUMIN 4.1 05/19/2022 0830   ALBUMIN 4.4 09/22/2021 0954   AST 16 05/19/2022 0830   ALT 14 05/19/2022 0830   ALKPHOS 77 05/19/2022 0830   BILITOT 0.3 05/19/2022 0830   GFRNONAA >60 05/19/2022 0830   GFRNONAA >89 01/06/2014 1643   GFRAA >60 02/25/2020 0334   GFRAA >89 01/06/2014 1643    No results found for: "SPEP", "UPEP"  Lab Results  Component Value Date   WBC 4.8 05/19/2022   NEUTROABS 2.3 05/19/2022   HGB 10.4 (L) 05/19/2022   HCT 32.6 (L) 05/19/2022   MCV 95.6 05/19/2022   PLT 222 05/19/2022      Chemistry      Component Value Date/Time   NA 144 05/19/2022 0830   NA 139 09/22/2021 0954   K 3.7 05/19/2022 0830   CL 109 05/19/2022 0830   CO2 28 05/19/2022 0830   BUN 9 05/19/2022 0830   BUN 13 09/22/2021 0954   CREATININE 0.55 05/19/2022 0830   CREATININE 0.67 04/23/2015 1645      Component Value Date/Time   CALCIUM 9.5 05/19/2022 0830   ALKPHOS 77 05/19/2022 0830   AST 16 05/19/2022 0830   ALT 14 05/19/2022 0830   BILITOT 0.3 05/19/2022 0830

## 2022-05-19 NOTE — Patient Instructions (Signed)
[facility] MEDICAL ONCOLOGY  Discharge Instructions: Thank you for choosing [facility] to provide your oncology and hematology care.   If you have a lab appointment with the Cancer Center, please go directly to the Cancer Center and check in at the registration area.   Wear comfortable clothing and clothing appropriate for easy access to any Portacath or PICC line.   We strive to give you quality time with your provider. You may need to reschedule your appointment if you arrive late (15 or more minutes).  Arriving late affects you and other patients whose appointments are after yours.  Also, if you miss three or more appointments without notifying the office, you may be dismissed from the clinic at the provider's discretion.      For prescription refill requests, have your pharmacy contact our office and allow 72 hours for refills to be completed.    Today you received the following chemotherapy and/or immunotherapy agents: Paclitaxel and Carboplatin      To help prevent nausea and vomiting after your treatment, we encourage you to take your nausea medication as directed.  BELOW ARE SYMPTOMS THAT SHOULD BE REPORTED IMMEDIATELY: *FEVER GREATER THAN 100.4 F (38 C) OR HIGHER *CHILLS OR SWEATING *NAUSEA AND VOMITING THAT IS NOT CONTROLLED WITH YOUR NAUSEA MEDICATION *UNUSUAL SHORTNESS OF BREATH *UNUSUAL BRUISING OR BLEEDING *URINARY PROBLEMS (pain or burning when urinating, or frequent urination) *BOWEL PROBLEMS (unusual diarrhea, constipation, pain near the anus) TENDERNESS IN MOUTH AND THROAT WITH OR WITHOUT PRESENCE OF ULCERS (sore throat, sores in mouth, or a toothache) UNUSUAL RASH, SWELLING OR PAIN  UNUSUAL VAGINAL DISCHARGE OR ITCHING   Items with * indicate a potential emergency and should be followed up as soon as possible or go to the Emergency Department if any problems should occur.  Please show the CHEMOTHERAPY ALERT CARD or IMMUNOTHERAPY ALERT  CARD at check-in to the Emergency Department and triage nurse.  Should you have questions after your visit or need to cancel or reschedule your appointment, please contact [facility] MEDICAL ONCOLOG[facility]  Dept: [phone]  and follow the prompts.  Office hours are 8:00 a.m. to 4:30 p.m. Monday - Friday. Please note that voicemails left after 4:00 p.m. may not be returned until the following business day.  We are closed weekends and major holidays. You have access to a nurse at all times for urgent questions. Please call the main number to the clinic Dept: [phone] and follow the prompts.   For any non-urgent questions, you may also contact your provider using MyChart. We now offer e-Visits for anyone 18 and older to request care online for non-urgent symptoms. For details visit [email or web address].   Also download the MyChart app! Go to the app store, search "MyChart", open the app, select [facility], and log in with your MyChart username and password.  Due to Covid, a mask is required upon entering the hospital/clinic. If you do not have a mask, one will be given to you upon arrival. For doctor visits, patients may have 1 support person aged 18 or older with them. For treatment visits, patients cannot have anyone with them due to current Covid guidelines and our immunocompromised population.   

## 2022-05-23 ENCOUNTER — Ambulatory Visit: Payer: Medicaid Other

## 2022-05-23 ENCOUNTER — Encounter: Payer: Medicaid Other | Admitting: Student

## 2022-05-23 ENCOUNTER — Ambulatory Visit
Admission: RE | Admit: 2022-05-23 | Discharge: 2022-05-23 | Disposition: A | Discharge status: Home / Self Care | Payer: Medicaid Other | Source: Ambulatory Visit | Attending: Radiation Oncology | Admitting: Radiation Oncology

## 2022-05-24 NOTE — Progress Notes (Signed)
Radiation Oncology         [phone] ________________________________  Initial Outpatient Consultation  Name: [name] MRN: [id number]  Date: 05/25/2022  DOB: 1961/03/01  CC:Masters, [name], DO  [name], MD   REFERRING PHYSICIAN: [name], MD  DIAGNOSIS: The encounter diagnosis was 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[facility]).  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 female who is seen as a courtesy of Dr. Berline Lopes for an opinion concerning radiation therapy as part of management for her recently diagnosed endometrial cancer.   The patient presented with daily heavy postmenopausal bleeding beginning in October of 2022. Subsequent pelvic US for further evaluation on 02/07/22 revealed a thickened heterogeneous endometrium measuring 22.8 mm in thickness, and a possible endometrial mass measuring  2.9 cm with internal vascularity.    Of note: her most recent PAP on 12//21/22 was negative for intraepithelial lesion or malignancy and HPV negative.   Accordingly, the patient met via virtual visit with her OB/GYN, Dr. Jodi Mourning ([facility]) on 02/10/22 for further discussion. During this visit, the patient denied any abnormal vaginal discharge, pelvic pain, or other concerns. Following review of imaging, Dr. Jodi Mourning advised proceeding with endometrial biopsies and prescribed the patient megace.   Endometrial biopsy collected on 02/22/22 by Dr. Jodi Mourning revealed carcinosarcoma (Malignant Mixed Mullerian Tumor) with predominance of sarcomatous component.   CT of the chest abdomen and pelvis on 03/04/22 further revealed heterogeneous enlargement  of the endometrial cavity; compatible with the patient's recent diagnosis of uterine carcinosarcoma. No definite involvement of the posterior bladder wall or sigmoid colon/rectum were appreciated. CT also revealed a very slightly enlarged 9 mm short axis right external iliac node, and a likely benign 3.6 x 2.7 cm lesion in the lateral segment left liver with subtle peripheral nodular enhancement. CT showed no other evidence of lymphadenopathy in the abdomen or pelvis  Subsequently, the patient was referred to Dr. Berline Lopes on 03/07/22 for further management. During this visit, the patient noted a significant decrease in postmenopausal bleeding with megace. However, the patient endorsed pelvic pain and menstrual-like cramping since her endometrial biopsy. The patient also reported an overall decrease in appetite since beginning Oxempic last summer, resulting in a 40 pound weight loss, and occasional constipation at baseline.   The patient opted to proceed with total hysterectomy with BSO and nodal biopsies on 03/30/22 under the care of Dr. Berline Lopes. Pathology from the procedure revealed: malignant mixed Mullerian tumor (MMMT/ carcinosarcoma) (MMR normal and MSI stable), measuring 6.3 cm, with less than 50% myometrial invasion. Bilateral fallopian tubes and ovaries negative for malignancy. Nodal status of 6/6 left pelvis lymph nodes negative for malignancy, and 1/1 right obturator SLN negative for malignancy.   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   Accordingly, the patient was referred to Dr. Alvy Bimler on 04/08/22 to discuss systemic treatment options. Following review of the risks and benefits, the patient agreed to proceed with chemotherapy consisting of carboplatin and paclitaxel beginning on 04/29/22. Due to her high  calculated dose of chemotherapy related to  her obesity, Dr. Alvy Bimler initiated upfront dose reduction of carboplatin by keeping it at maximum dose of 750 mg and reducing paclitaxel by 25%.   Per her most recent follow up visit with Dr. Alvy Bimler on 05/19/22 (prior to cycle 2), the patient tolerated cycle 1 of systemic treatment well except for diffuse muscle aches and pain that resolved with pain medicine. She otherwise denied worsening peripheral neuropathy (pre-existing due to diabetes), nausea, or recent constipation. The patient was however noted to have chemotherapy induced anemia, for which she is asymptomatic for. Given so, she has not required transfusions for this. If her anemia progresses, Dr. Alvy Bimler will accordingly delay her treatment or adjust dosages. For her muscle pains, the patient was encouraged to take premed oral dexamethasone.   PREVIOUS RADIATION THERAPY: No  PAST MEDICAL HISTORY:  Past Medical History:  Diagnosis Date   Anemia    Asthma 11/01/2010   only when respiratory infections   BPPV (benign paroxysmal positional vertigo) 03/07/2013   Diabetes mellitus    since 1983   Elevated TSH 05/13/2013   Endometrial cancer ([place])    GERD (gastroesophageal reflux disease)    Hypertension    Left elbow pain 05/05/2016   Left shoulder pain 05/05/2016   Osteomyelitis ([place])    Pneumonia    as a child   Wrist pain 04/11/2013    PAST SURGICAL HISTORY: Past Surgical History:  Procedure Laterality Date   ABDOMINAL HYSTERECTOMY     ACHILLES TENDON LENGTHENING Left 08/29/2014   Procedure: Left Achilles Lengthening;  Surgeon: Newt Minion, MD;  Location: [place];  Service: Orthopedics;  Laterality: Left;   AMPUTATION Left 02/19/2013   Procedure: Amputation of Left Great Toe;  Surgeon: Mcarthur Rossetti, MD;  Location: [place];  Service: Orthopedics;  Laterality: Left;   AMPUTATION Left 08/29/2014   Procedure: Left Foot 2nd and 1st Toe Amputation;  Surgeon: Newt Minion, MD;   Location: [place];  Service: Orthopedics;  Laterality: Left;   CARDIAC CATHETERIZATION     15 years ago   CESAREAN SECTION     x 4   CYSTOSCOPY  03/30/2022   Procedure: CYSTOSCOPY;  Surgeon: [name], MD;  Location: [facility];  Service: Gynecology;;   IR IMAGING GUIDED PORT INSERTION  04/19/2022   ROBOTIC ASSISTED TOTAL HYSTERECTOMY WITH BILATERAL SALPINGO OOPHERECTOMY Bilateral 03/30/2022   Procedure: XI ROBOTIC ASSISTED TOTAL HYSTERECTOMY WITH BILATERAL SALPINGO-OOPHORECTOMY, MINI LAPAROTOMY;  Surgeon: [name], MD;  Location: [facility];  Service: Gynecology;  Laterality: Bilateral;   SENTINEL NODE BIOPSY N/A 03/30/2022   Procedure: SENTINEL NODE BIOPSY;  Surgeon: [name], MD;  Location: [facility];  Service: Gynecology;  Laterality: N/A;   TUBAL LIGATION     at one of c-sections    FAMILY HISTORY:  Family History  Problem Relation Age of Onset   Hypertension Mother    Dementia Mother    Diabetes Father    Hypertension Father    Diabetes Daughter    Colon cancer Neg Hx    Breast cancer Neg Hx    Ovarian cancer Neg Hx    Endometrial cancer Neg Hx    Pancreatic cancer Neg Hx    Prostate cancer Neg Hx     SOCIAL HISTORY:  Social History   Tobacco Use   Smoking status: Former    Types: Cigarettes    Quit date: 11/28/1962    Years since quitting: 59.5   Smokeless tobacco: Never  Vaping Use   Vaping  Use: Never used  Substance Use Topics   Alcohol use: No    Alcohol/week: 0.0 standard drinks of alcohol   Drug use: No    ALLERGIES:  Allergies  Allergen Reactions   Nsaids Nausea And Vomiting    Acid reflux   Penicillins Other (See Comments)    Unknown Did it involve swelling of the face/tongue/throat, SOB, or low BP? Unk Did it involve sudden or severe rash/hives, skin peeling, or any reaction on the inside of your mouth or nose? Unk  Did you need to seek medical attention at a hospital or doctor's office? Unk When did it last happen? Childhood       If all above answers are "NO", may proceed with cephalosporin use.     MEDICATIONS:  Current Outpatient Medications  Medication Sig Dispense Refill   ACCU-CHEK GUIDE test strip USE UP TO FOUR TIMES DAILY AS DIRECTED 100 each 3   acetaminophen (TYLENOL) 650 MG CR tablet Take 1,300-1,950 mg by mouth every 8 (eight) hours as needed for pain.     atorvastatin (LIPITOR) 80 MG tablet Take 1 tablet (80 mg total) by mouth daily. 90 tablet 3   blood glucose meter kit and supplies Dispense based on patient and insurance preference. Use up to four times daily as directed. (FOR ICD-10 E10.9, E11.9). 1 each 0   Continuous Blood Gluc Transmit (DEXCOM G6 TRANSMITTER) MISC Check blood sugar at least 8 times a day 1 each 3   dexamethasone (DECADRON) 4 MG tablet Take 2 tabs at the night before chemotherapy, every 3 weeks, by mouth x 6 cycles 12 tablet 6   gabapentin (NEURONTIN) 300 MG capsule Take 3 capsules (900 mg total) by mouth 3 (three) times daily. 270 capsule 6   insulin glargine (LANTUS SOLOSTAR) 100 UNIT/ML Solostar Pen Inject 80 Units into the skin at bedtime. 24 mL 2   insulin lispro (HUMALOG) 100 UNIT/ML injection Inject 16-18 units with breakfast and lunch and 14 units with dinner. 10 mL 3   Insulin Pen Needle 31G X 5 MM MISC Use as directed with Lantus and Humalog 100 each PRN   Lancets (ONETOUCH DELICA PLUS [id number]) MISC Check blood sugar 3 times a day 300 each 3   lidocaine-prilocaine (EMLA) cream Apply to affected area once 30 g 3   lisinopril (ZESTRIL) 20 MG tablet Take 1 tablet (20 mg total) by mouth daily. 30 tablet 11   meclizine (ANTIVERT) 25 MG tablet Take 1 tablet (25 mg total) by mouth 3 (three) times daily as needed for dizziness. 60 tablet 1   metFORMIN (GLUCOPHAGE-XR) 500 MG 24 hr tablet Take 4 tablets (2,000 mg total) by mouth at bedtime. 360 tablet 3   Multiple Vitamin (MULTIVITAMIN WITH MINERALS) TABS tablet Take 1 tablet by mouth every evening.      omeprazole (PRILOSEC) 20  MG capsule Take 1 capsule (20 mg total) by mouth daily. (Patient taking differently: Take 20 mg by mouth daily as needed (acid reflux).) 30 capsule 1   ondansetron (ZOFRAN) 8 MG tablet Take 1 tablet (8 mg total) by mouth 2 (two) times daily as needed. Start on the third day after chemotherapy. 30 tablet 1   prochlorperazine (COMPAZINE) 10 MG tablet Take 1 tablet (10 mg total) by mouth every 6 (six) hours as needed (Nausea or vomiting). 30 tablet 1   Semaglutide, 2 MG/DOSE, 8 MG/3ML SOPN Inject 2 mg into the skin once a week. 10 mL 3   traMADol (ULTRAM) 50 MG tablet Take  1 tablet (50 mg total) by mouth every 6 (six) hours as needed for severe pain. 30 tablet 0   traZODone (DESYREL) 50 MG tablet Take 1 tablet (50 mg total) by mouth at bedtime as needed for sleep. 30 tablet 1   diphenhydrAMINE HCl, Sleep, 50 MG CAPS Take 150 mg by mouth at bedtime. (Patient not taking: Reported on 05/25/2022)     levofloxacin (LEVAQUIN) 500 MG tablet Take 1 tablet (500 mg total) by mouth daily. Continue taking flagyl as well (Patient not taking: Reported on 05/25/2022) 7 tablet 0   senna-docusate (SENOKOT-S) 8.6-50 MG tablet Take 2 tablets by mouth at bedtime. For AFTER surgery, do not take if having diarrhea (Patient not taking: Reported on 05/25/2022) 30 tablet 0   No current facility-administered medications for this encounter.    REVIEW OF SYSTEMS:  A 10+ POINT REVIEW OF SYSTEMS WAS OBTAINED including neurology, dermatology, psychiatry, cardiac, respiratory, lymph, extremities, GI, GU, musculoskeletal, constitutional, reproductive, HEENT.  She denies any pelvic pain or vaginal bleeding at this time.   PHYSICAL EXAM:  General: Alert and oriented, in no acute distress HEENT: Head is normocephalic. Extraocular movements are intact. Neck: Neck is supple, no palpable cervical or supraclavicular lymphadenopathy. Heart: Regular in rate and rhythm with no murmurs, rubs, or gallops. Chest: Clear to auscultation bilaterally,  with no rhonchi, wheezes, or rales. Abdomen: Soft, nontender, nondistended, with no rigidity or guarding. Extremities: No cyanosis or edema. Lymphatics: see Neck Exam Skin: No concerning lesions. Musculoskeletal: symmetric strength and muscle tone throughout. Neurologic: Cranial nerves II through XII are grossly intact. No obvious focalities. Speech is fluent. Coordination is intact. Psychiatric: Judgment and insight are intact. Affect is appropriate. Pelvic exam deferred today will be performed at the time of her vaginal brachytherapy planning and treatment.    ECOG = 1  0 - Asymptomatic (Fully active, able to carry on all predisease activities without restriction)  1 - Symptomatic but completely ambulatory (Restricted in physically strenuous activity but ambulatory and able to carry out work of a light or sedentary nature. For example, light housework, office work)  2 - Symptomatic, <50% in bed during the day (Ambulatory and capable of all self care but unable to carry out any work activities. Up and about more than 50% of waking hours)  3 - Symptomatic, >50% in bed, but not bedbound (Capable of only limited self-care, confined to bed or chair 50% or more of waking hours)  4 - Bedbound (Completely disabled. Cannot carry on any self-care. Totally confined to bed or chair)  5 - Death   Eustace Pen MM, Creech RH, Tormey DC, et al. [phone]). "Toxicity and response criteria of the [facility]". [place] Oncol. 5 (6): 649-55  LABORATORY DATA:  Lab Results  Component Value Date   WBC 4.8 05/19/2022   HGB 10.4 (L) 05/19/2022   HCT 32.6 (L) 05/19/2022   MCV 95.6 05/19/2022   PLT 222 05/19/2022   NEUTROABS 2.3 05/19/2022   Lab Results  Component Value Date   NA 144 05/19/2022   K 3.7 05/19/2022   CL 109 05/19/2022   CO2 28 05/19/2022   GLUCOSE 83 05/19/2022   BUN 9 05/19/2022   CREATININE 0.55 05/19/2022   CALCIUM 9.5 05/19/2022      RADIOGRAPHY: No  results found.    IMPRESSION: Carcinosarcoma of the endometrium,  Stage II-C by 2023 FIGO staging  Given the pathologic findings patient would be at risk for vaginal cuff recurrence and would recommend  vaginal brachytherapy to reduce the chances of recurrence in this area.  As above the patient is proceeding with systemic chemotherapy to reduce her chances for relapse elsewhere.  Patient's case was presented at the multidisciplinary gynecologic oncology conference and recommendations were for systemic chemotherapy and vaginal brachytherapy in addition.  Today, I talked to the patient  about the findings and work-up thus far.  We discussed the natural history of carcinosarcoma of the uterus and general treatment, highlighting the role of radiotherapy in the management.  We discussed the available radiation techniques, and focused on the details of logistics and delivery.  We reviewed the anticipated acute and late sequelae associated with radiation in this setting.  The patient was encouraged to ask questions that I answered to the best of my ability.  A patient consent form was discussed and signed.  We retained a copy for our records.  The patient would like to proceed with radiation and will be scheduled for CT simulation.  PLAN: She will be scheduled for vaginal brachytherapy to begin during the course of her adjuvant chemotherapy.  Anticipate 5 high-dose-rate treatments directed at the vaginal cuff.  Iridium 192 will be the high-dose-rate source.   60 minutes of total time was spent for this patient encounter, including preparation, face-to-face counseling with the patient and coordination of care, physical exam, and documentation of the encounter.   ------------------------------------------------  Blair Promise, PhD, MD  This document serves as a record of services personally performed by Gery Pray, MD. It was created on his behalf by Roney Mans, a trained medical scribe. The  creation of this record is based on the scribe's personal observations and the provider's statements to them. This document has been checked and approved by the attending provider.

## 2022-05-25 ENCOUNTER — Encounter: Payer: Self-pay | Admitting: Radiation Oncology

## 2022-05-25 ENCOUNTER — Ambulatory Visit
Admission: RE | Admit: 2022-05-25 | Discharge: 2022-05-25 | Disposition: A | Discharge status: Home / Self Care | Payer: Medicaid Other | Source: Ambulatory Visit | Attending: Radiation Oncology | Admitting: Radiation Oncology

## 2022-05-25 ENCOUNTER — Other Ambulatory Visit: Payer: Self-pay

## 2022-05-25 VITALS — BP 142/122 | HR 106 | Temp 99.1°F | Wt 275.6 lb

## 2022-05-25 DIAGNOSIS — K219 Gastro-esophageal reflux disease without esophagitis: Secondary | ICD-10-CM | POA: Insufficient documentation

## 2022-05-25 DIAGNOSIS — Z87891 Personal history of nicotine dependence: Secondary | ICD-10-CM | POA: Diagnosis not present

## 2022-05-25 DIAGNOSIS — I10 Essential (primary) hypertension: Secondary | ICD-10-CM | POA: Insufficient documentation

## 2022-05-25 DIAGNOSIS — K5900 Constipation, unspecified: Secondary | ICD-10-CM | POA: Diagnosis not present

## 2022-05-25 DIAGNOSIS — Z7952 Long term (current) use of systemic steroids: Secondary | ICD-10-CM | POA: Insufficient documentation

## 2022-05-25 DIAGNOSIS — J45909 Unspecified asthma, uncomplicated: Secondary | ICD-10-CM | POA: Diagnosis not present

## 2022-05-25 DIAGNOSIS — D6481 Anemia due to antineoplastic chemotherapy: Secondary | ICD-10-CM | POA: Insufficient documentation

## 2022-05-25 DIAGNOSIS — E119 Type 2 diabetes mellitus without complications: Secondary | ICD-10-CM | POA: Diagnosis not present

## 2022-05-25 DIAGNOSIS — C541 Malignant neoplasm of endometrium: Secondary | ICD-10-CM | POA: Insufficient documentation

## 2022-05-25 DIAGNOSIS — Z7984 Long term (current) use of oral hypoglycemic drugs: Secondary | ICD-10-CM | POA: Insufficient documentation

## 2022-05-25 DIAGNOSIS — R110 Nausea: Secondary | ICD-10-CM | POA: Insufficient documentation

## 2022-05-25 DIAGNOSIS — Z79899 Other long term (current) drug therapy: Secondary | ICD-10-CM | POA: Insufficient documentation

## 2022-05-25 NOTE — Progress Notes (Unsigned)
Pt showed up to [facility] requesting to speak with Dr Alvy Bimler regarding pain. Pt reports she is taking prescribed Tramadol 50 mg 2 TABS PO q6h with Tylenol 650 mg 2 tabs every 6 hours for joint pain which is not helping. Pt reports the medication suddenly "stopped working after a few days." Advised pt she should take medication as prescribed, and she was educated on proper dosing of Tylenol. Pt requests Dr Alvy Bimler give something different. Advised pt I would speak with MD and make her aware of pt's concerns. Recommended pt use Voltaren Gel for joint pain in the mean time and we will be in touch regarding what MD advises. Pt verbalized understanding.

## 2022-05-25 NOTE — Progress Notes (Signed)
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 SENTINEL LYMPH NODE, RIGHT OBTURATOR, EXCISION:  - Lymph node, negative for carcinoma (0/1)   B. 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 LYMPH NODES, LEFT PELVIC, RESECTION:  - Six lymph nodes, negative for carcinoma (0/6)   Past/Anticipated interventions by Gyn/Onc surgery, if any: Robotic-assisted laparoscopic total hysterectomy with bilateral salpingoophorectomy, SLN biopsy on the right, left pelvic lymphadenectomy, lysis of adhesions by Dr. Berline Lopes on 03/30/22.  Past/Anticipated interventions by medical oncology, if any: 6 cycles carboplatin/Taxol - s/p cycle 2 on 05/19/22.   Weight changes, if any: no  Bowel/Bladder complaints, if any: has diarrhea from chemotherapy.  Nausea/Vomiting, if any: yes from chemotherapy.  Pain issues, if any:  yes Joint aches from chemotherapy.  SAFETY ISSUES: Prior radiation? no Pacemaker/ICD? no Possible current pregnancy? no Is the patient on methotrexate? no  Current Complaints / other details:  none noted.

## 2022-05-26 ENCOUNTER — Encounter: Payer: Self-pay | Admitting: Hematology and Oncology

## 2022-05-26 ENCOUNTER — Telehealth: Payer: Self-pay | Admitting: *Deleted

## 2022-05-26 ENCOUNTER — Other Ambulatory Visit: Payer: Self-pay | Admitting: Hematology and Oncology

## 2022-05-26 DIAGNOSIS — M2550 Pain in unspecified joint: Secondary | ICD-10-CM | POA: Insufficient documentation

## 2022-05-26 DIAGNOSIS — C541 Malignant neoplasm of endometrium: Secondary | ICD-10-CM

## 2022-05-26 HISTORY — DX: Pain in unspecified joint: M25.50

## 2022-05-26 MED ORDER — TRAMADOL HCL 50 MG PO TABS: 100.0000 mg | ORAL_TABLET | Freq: Four times a day (QID) | ORAL | 0 refills | Status: DC | PRN

## 2022-05-26 NOTE — Telephone Encounter (Signed)
CALLED PATIENT TO INFORM OF NEW HDR [facility], SPOKE WITH PATIENT AND SHE IS AWARE OF THESE APPTS. °

## 2022-05-26 NOTE — Progress Notes (Signed)
Hi [name],  Please call her back and assess her pain. I might have to DC her chemo if her pain is this severe 7 days out after chemo

## 2022-05-26 NOTE — Progress Notes (Signed)
Called to assess her pain. Today is a good day. She has been having pain in her hands, wrists and legs. She has not taken anything today. She was at [facility] yesterday and just stopped by. She does not want to stop treatment. She will try tylenol today if needed. She ask Tramadol refill to have on hand if possible to [place] on [address].

## 2022-05-27 ENCOUNTER — Encounter: Payer: Self-pay | Admitting: Hematology and Oncology

## 2022-05-27 NOTE — Progress Notes (Signed)
Patient left voicemail.  Returned call to patient to advise she may bring documentation at next visit and sign grant paperwork. She verbalized understanding.

## 2022-06-03 ENCOUNTER — Encounter: Payer: Medicaid Other | Admitting: Internal Medicine

## 2022-06-08 MED FILL — Dexamethasone Sodium Phosphate Inj 100 MG/10ML: INTRAMUSCULAR | Qty: 1 | Status: AC

## 2022-06-08 MED FILL — Fosaprepitant Dimeglumine For IV Infusion 150 MG (Base Eq): INTRAVENOUS | Qty: 5 | Status: AC

## 2022-06-09 ENCOUNTER — Inpatient Hospital Stay: Payer: Medicaid Other | Attending: Gynecologic Oncology

## 2022-06-09 ENCOUNTER — Encounter: Payer: Self-pay | Admitting: Hematology and Oncology

## 2022-06-09 ENCOUNTER — Other Ambulatory Visit: Payer: Self-pay | Admitting: Hematology and Oncology

## 2022-06-09 ENCOUNTER — Inpatient Hospital Stay: Payer: Medicaid Other

## 2022-06-09 ENCOUNTER — Inpatient Hospital Stay (HOSPITAL_BASED_OUTPATIENT_CLINIC_OR_DEPARTMENT_OTHER): Payer: Medicaid Other | Admitting: Hematology and Oncology

## 2022-06-09 ENCOUNTER — Other Ambulatory Visit: Payer: Self-pay

## 2022-06-09 ENCOUNTER — Telehealth: Payer: Self-pay | Admitting: *Deleted

## 2022-06-09 VITALS — BP 135/75 | HR 87 | Temp 98.7°F | Resp 18

## 2022-06-09 DIAGNOSIS — Z5111 Encounter for antineoplastic chemotherapy: Secondary | ICD-10-CM | POA: Diagnosis present

## 2022-06-09 DIAGNOSIS — C541 Malignant neoplasm of endometrium: Secondary | ICD-10-CM | POA: Diagnosis present

## 2022-06-09 DIAGNOSIS — D6481 Anemia due to antineoplastic chemotherapy: Secondary | ICD-10-CM

## 2022-06-09 DIAGNOSIS — Z886 Allergy status to analgesic agent status: Secondary | ICD-10-CM | POA: Diagnosis not present

## 2022-06-09 DIAGNOSIS — Z880 Allergy status to penicillin: Secondary | ICD-10-CM | POA: Diagnosis not present

## 2022-06-09 DIAGNOSIS — Z79899 Other long term (current) drug therapy: Secondary | ICD-10-CM | POA: Diagnosis not present

## 2022-06-09 DIAGNOSIS — T451X5A Adverse effect of antineoplastic and immunosuppressive drugs, initial encounter: Secondary | ICD-10-CM | POA: Insufficient documentation

## 2022-06-09 DIAGNOSIS — N950 Postmenopausal bleeding: Secondary | ICD-10-CM | POA: Diagnosis not present

## 2022-06-09 DIAGNOSIS — E1340 Other specified diabetes mellitus with diabetic neuropathy, unspecified: Secondary | ICD-10-CM | POA: Diagnosis not present

## 2022-06-09 DIAGNOSIS — E1165 Type 2 diabetes mellitus with hyperglycemia: Secondary | ICD-10-CM

## 2022-06-09 DIAGNOSIS — E1142 Type 2 diabetes mellitus with diabetic polyneuropathy: Secondary | ICD-10-CM | POA: Insufficient documentation

## 2022-06-09 DIAGNOSIS — Z7952 Long term (current) use of systemic steroids: Secondary | ICD-10-CM | POA: Insufficient documentation

## 2022-06-09 LAB — CMP (CANCER CENTER ONLY)
ALT: 15 U/L (ref 0–44)
AST: 17 U/L (ref 15–41)
Albumin: 4.4 g/dL (ref 3.5–5.0)
Alkaline Phosphatase: 88 U/L (ref 38–126)
Anion gap: 9 (ref 5–15)
BUN: 12 mg/dL (ref 6–20)
CO2: 25 mmol/L (ref 22–32)
Calcium: 9.7 mg/dL (ref 8.9–10.3)
Chloride: 107 mmol/L (ref 98–111)
Creatinine: 0.63 mg/dL (ref 0.44–1.00)
GFR, Estimated: >60 mL/min (ref >60)
Glucose, Bld: 106 mg/dL — ABNORMAL HIGH (ref 70–99)
Potassium: 4.3 mmol/L (ref 3.5–5.1)
Sodium: 141 mmol/L (ref 135–145)
Total Bilirubin: 0.4 mg/dL (ref 0.3–1.2)
Total Protein: 7.8 g/dL (ref 6.5–8.1)

## 2022-06-09 LAB — CBC WITH DIFFERENTIAL (CANCER CENTER ONLY)
Abs Immature Granulocytes: 0.01 10*3/uL (ref 0.00–0.07)
Basophils Absolute: 0 10*3/uL (ref 0.0–0.1)
Basophils Relative: 0 %
Eosinophils Absolute: 0 10*3/uL (ref 0.0–0.5)
Eosinophils Relative: 0 %
HCT: 33.9 % — ABNORMAL LOW (ref 36.0–46.0)
Hemoglobin: 11.1 g/dL — ABNORMAL LOW (ref 12.0–15.0)
Immature Granulocytes: 0 %
Lymphocytes Relative: 20 %
Lymphs Abs: 1.1 10*3/uL (ref 0.7–4.0)
MCH: 30.8 pg (ref 26.0–34.0)
MCHC: 32.7 g/dL (ref 30.0–36.0)
MCV: 94.2 fL (ref 80.0–100.0)
Monocytes Absolute: 0.1 10*3/uL (ref 0.1–1.0)
Monocytes Relative: 1 %
Neutro Abs: 4.3 10*3/uL (ref 1.7–7.7)
Neutrophils Relative %: 79 %
Platelet Count: 228 10*3/uL (ref 150–400)
RBC: 3.6 MIL/uL — ABNORMAL LOW (ref 3.87–5.11)
RDW: 14.6 % (ref 11.5–15.5)
WBC Count: 5.4 10*3/uL (ref 4.0–10.5)
nRBC: 0 % (ref 0.0–0.2)

## 2022-06-09 MED ORDER — HEPARIN SOD (PORK) LOCK FLUSH 100 UNIT/ML IV SOLN
500.0000 [IU] | Freq: Once | INTRAVENOUS | Status: AC | PRN
  Administered 2022-06-09: 500 [IU]

## 2022-06-09 MED ORDER — DIPHENHYDRAMINE HCL 50 MG/ML IJ SOLN
25.0000 mg | Freq: Once | INTRAMUSCULAR | Status: AC
  Administered 2022-06-09: 25 mg via INTRAVENOUS
  Filled 2022-06-09: qty 1

## 2022-06-09 MED ORDER — SODIUM CHLORIDE 0.9 % IV SOLN: Freq: Once | INTRAVENOUS | Status: AC

## 2022-06-09 MED ORDER — SODIUM CHLORIDE 0.9 % IV SOLN
131.2500 mg/m2 | Freq: Once | INTRAVENOUS | Status: AC
  Administered 2022-06-09: 330 mg via INTRAVENOUS
  Filled 2022-06-09: qty 55

## 2022-06-09 MED ORDER — SODIUM CHLORIDE 0.9% FLUSH
10.0000 mL | INTRAVENOUS | Status: DC | PRN
  Administered 2022-06-09: 10 mL

## 2022-06-09 MED ORDER — SODIUM CHLORIDE 0.9 % IV SOLN
750.0000 mg | Freq: Once | INTRAVENOUS | Status: AC
  Administered 2022-06-09: 750 mg via INTRAVENOUS
  Filled 2022-06-09: qty 75

## 2022-06-09 MED ORDER — SODIUM CHLORIDE 0.9 % IV SOLN
10.0000 mg | Freq: Once | INTRAVENOUS | Status: AC
  Administered 2022-06-09: 10 mg via INTRAVENOUS
  Filled 2022-06-09: qty 10

## 2022-06-09 MED ORDER — FAMOTIDINE IN NACL 20-0.9 MG/50ML-% IV SOLN
20.0000 mg | Freq: Once | INTRAVENOUS | Status: AC
  Administered 2022-06-09: 20 mg via INTRAVENOUS
  Filled 2022-06-09: qty 50

## 2022-06-09 MED ORDER — PALONOSETRON HCL INJECTION 0.25 MG/5ML
0.2500 mg | Freq: Once | INTRAVENOUS | Status: AC
  Administered 2022-06-09: 0.25 mg via INTRAVENOUS
  Filled 2022-06-09: qty 5

## 2022-06-09 MED ORDER — SODIUM CHLORIDE 0.9 % IV SOLN
150.0000 mg | Freq: Once | INTRAVENOUS | Status: AC
  Administered 2022-06-09: 150 mg via INTRAVENOUS
  Filled 2022-06-09: qty 150

## 2022-06-09 NOTE — Progress Notes (Signed)
Radiation Oncology         [phone]) [phone] ________________________________  Name: [name] MRN: [id number]  Date: 06/13/2022  DOB: 18-Sep-1961  Vaginal Brachytherapy Procedure Note  CC: Masters, [name], DO [name], MD  No diagnosis found.  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 Since her initial consultation date, the patient followed up with Dr. Alvy Bimler on 06/09/22. During which time, the patient was noted to tolerate systemic treatment well other than diffuse muscle aches and pain that resolved with pain medicine. The patient also had peripheral neuropathy prior to treatment which had remained stable throughout the course of treatment.   Otherwise, no significant interval history since the patient was seen for her initial consultation.   ***  ALLERGIES: is allergic to nsaids and penicillins.  Meds: Current Outpatient Medications  Medication Sig Dispense Refill   ACCU-CHEK GUIDE test strip USE UP TO FOUR TIMES DAILY AS DIRECTED 100 each 3   acetaminophen (TYLENOL) 650 MG CR tablet Take 1,300-1,950 mg by mouth every 8 (eight) hours as needed for pain.     atorvastatin (LIPITOR) 80 MG tablet Take 1 tablet (80 mg total) by mouth daily. 90 tablet 3   blood glucose meter kit and supplies Dispense based on patient and insurance preference. Use up to four times daily as directed. (FOR ICD-10 E10.9, E11.9). 1 each 0   Continuous Blood Gluc Transmit (DEXCOM G6 TRANSMITTER) MISC Check blood sugar at least 8 times a day 1 each 3    dexamethasone (DECADRON) 4 MG tablet Take 2 tabs at the night before chemotherapy, every 3 weeks, by mouth x 6 cycles 12 tablet 6   gabapentin (NEURONTIN) 300 MG capsule Take 3 capsules (900 mg total) by mouth 3 (three) times daily. 270 capsule 6   insulin glargine (LANTUS SOLOSTAR) 100 UNIT/ML Solostar Pen Inject 80 Units into the skin at bedtime. 24 mL 2   insulin lispro (HUMALOG) 100 UNIT/ML injection Inject 16-18 units with breakfast and lunch and 14 units with dinner. 10 mL 3   Insulin Pen Needle 31G X 5 MM MISC Use as directed with Lantus and Humalog 100 each PRN   Lancets (ONETOUCH DELICA PLUS [id number]) MISC Check blood sugar 3 times a day 300 each 3   lidocaine-prilocaine (EMLA) cream Apply to affected area once 30 g 3   lisinopril (ZESTRIL) 20 MG tablet Take 1 tablet (20 mg total) by mouth daily. 30 tablet 11   meclizine (ANTIVERT) 25 MG tablet Take 1 tablet (25 mg total) by mouth 3 (three) times daily as needed for dizziness. 60 tablet 1   metFORMIN (GLUCOPHAGE-XR) 500 MG 24 hr tablet Take 4 tablets (2,000 mg total) by mouth at bedtime. 360 tablet 3   Multiple Vitamin (MULTIVITAMIN WITH MINERALS) TABS tablet Take 1 tablet by mouth every evening.      omeprazole (PRILOSEC) 20 MG capsule Take 1 capsule (20 mg total) by mouth daily. (Patient taking differently: Take 20 mg by mouth daily as needed (acid reflux).) 30 capsule 1   ondansetron (ZOFRAN) 8 MG tablet Take 1 tablet (8 mg total) by  mouth 2 (two) times daily as needed. Start on the third day after chemotherapy. 30 tablet 1   prochlorperazine (COMPAZINE) 10 MG tablet Take 1 tablet (10 mg total) by mouth every 6 (six) hours as needed (Nausea or vomiting). 30 tablet 1   Semaglutide, 2 MG/DOSE, 8 MG/3ML SOPN Inject 2 mg into the skin once a week. 10 mL 3   traMADol (ULTRAM) 50 MG tablet Take 2 tablets (100 mg total) by mouth every 6 (six) hours as needed for severe pain. 60 tablet 0   traZODone (DESYREL) 50 MG tablet Take 1 tablet (50 mg  total) by mouth at bedtime as needed for sleep. 30 tablet 1   No current facility-administered medications for this encounter.   Facility-Administered Medications Ordered in Other Encounters  Medication Dose Route Frequency Provider Last Rate Last Admin   sodium chloride flush (NS) 0.9 % injection 10 mL  10 mL Intracatheter PRN Heath Lark, MD   10 mL at 06/09/22 1504    Physical Findings: The patient is in no acute distress. Patient is alert and oriented.  vitals were not taken for this visit.   No palpable cervical, supraclavicular or axillary lymphoadenopathy. The heart has a regular rate and rhythm. The lungs are clear to auscultation. Abdomen soft and non-tender.  On pelvic examination the external genitalia were unremarkable. A speculum exam was performed. Vaginal cuff intact, no mucosal lesions. On bimanual exam there were no pelvic masses appreciated. ***  Lab Findings: Lab Results  Component Value Date   WBC 5.4 06/09/2022   HGB 11.1 (L) 06/09/2022   HCT 33.9 (L) 06/09/2022   MCV 94.2 06/09/2022   PLT 228 06/09/2022    Radiographic Findings: No results found.  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 The patient will be treated with a *** cm diameter cylinder with a treatment length of *** cm. This distended the vaginal vault without undue discomfort. The patient tolerated the procedure well.  The patient was successfully fitted for a vaginal cylinder. The patient is appropriate to begin vaginal brachytherapy.   Plan: The patient will proceed with CT simulation and vaginal brachytherapy today.    _______________________________   Blair Promise, PhD, MD  This document serves as a record of services personally performed by Gery Pray, MD. It was created on his behalf by Roney Mans, a trained medical scribe. The  creation of this record is based on the scribe's personal observations and the provider's statements to them. This document has been checked and approved by the attending provider.

## 2022-06-09 NOTE — Assessment & Plan Note (Signed)

## 2022-06-09 NOTE — Assessment & Plan Note (Signed)
She tolerated cycle 1 well except for diffuse muscle aches and pain that resolved with pain medicine I reminded her to take premed oral dexamethasone I will see her prior to cycle 4 of treatment

## 2022-06-09 NOTE — Telephone Encounter (Signed)
Called patient to remind of [facility] [place] [facility] for 06-13-22, -arrival time- 7:45 am @ [facility], spoke with patient and she is aware of these appts.

## 2022-06-09 NOTE — Assessment & Plan Note (Signed)
She has pre-existing peripheral neuropathy due to diabetes I recommend upfront dose reduction of paclitaxel, we will continue the same

## 2022-06-09 NOTE — Assessment & Plan Note (Signed)
The patient is vigilant about blood sugar control She has lost some weight I congratulated her effort with lifestyle changes

## 2022-06-09 NOTE — Progress Notes (Signed)
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 The patient denies recent history of bleeding such as epistaxis, hematuria or hematochezia. She is asymptomatic from the anemia. I will observe for now.  She does not require transfusion now. I will continue the chemotherapy at current dose without dosage adjustment.  If the anemia gets progressive worse in the future, I might have to delay her treatment or adjust the chemotherapy dose.   No orders of the defined types were placed in this encounter.   All questions were answered. The patient knows to call the clinic with any problems, questions or concerns. The total time spent in the appointment was 20 minutes encounter with patients including review of chart and various tests results, discussions about plan of care and coordination of care plan   Heath Lark, MD 06/09/2022 9:49 AM  INTERVAL HISTORY: Please see below for problem oriented charting. she returns for treatment follow-up seen prior to cycle 3 of treatment She is doing well Her muscle aches and pain after treatment is less She denies worsening peripheral neuropathy No recent nausea or changes in bowel  habit  REVIEW OF SYSTEMS:   Constitutional: Denies fevers, chills or abnormal weight loss Eyes: Denies blurriness of vision Ears, nose, mouth, throat, and face: Denies mucositis or sore throat Respiratory: Denies cough, dyspnea or wheezes Cardiovascular: Denies palpitation, chest discomfort or lower extremity swelling Gastrointestinal:  Denies nausea, heartburn or change in bowel habits Skin: Denies abnormal skin rashes Lymphatics: Denies new lymphadenopathy or easy bruising Behavioral/Psych: Mood is stable, no new changes  All other systems were reviewed with the patient and are negative.  I have reviewed the past medical history, past surgical history, social history and family history with the patient and they are unchanged from previous note.  ALLERGIES:  is allergic to nsaids and penicillins.  MEDICATIONS:  Current Outpatient Medications  Medication Sig Dispense Refill   ACCU-CHEK GUIDE test strip USE UP TO FOUR TIMES DAILY AS DIRECTED 100 each 3   acetaminophen (TYLENOL) 650 MG CR tablet Take 1,300-1,950 mg by mouth every 8 (eight) hours as needed for pain.     atorvastatin (LIPITOR) 80 MG tablet Take 1 tablet (80 mg total) by mouth daily. 90 tablet 3   blood glucose meter kit and supplies Dispense based on patient and insurance preference. Use up to four times daily as directed. (FOR ICD-10 E10.9, E11.9). 1 each 0   Continuous Blood Gluc Transmit (DEXCOM G6 TRANSMITTER) MISC Check blood sugar at least 8 times a day 1 each 3   dexamethasone (DECADRON) 4 MG tablet Take 2 tabs at the night before chemotherapy, every 3 weeks, by mouth x 6 cycles 12 tablet 6   gabapentin (NEURONTIN) 300 MG capsule Take 3 capsules (900 mg total) by mouth 3 (  three) times daily. 270 capsule 6   insulin glargine (LANTUS SOLOSTAR) 100 UNIT/ML Solostar Pen Inject 80 Units into the skin at bedtime. 24 mL 2   insulin lispro (HUMALOG) 100 UNIT/ML injection Inject 16-18 units with breakfast and lunch and 14  units with dinner. 10 mL 3   Insulin Pen Needle 31G X 5 MM MISC Use as directed with Lantus and Humalog 100 each PRN   Lancets (ONETOUCH DELICA PLUS [id number]) MISC Check blood sugar 3 times a day 300 each 3   lidocaine-prilocaine (EMLA) cream Apply to affected area once 30 g 3   lisinopril (ZESTRIL) 20 MG tablet Take 1 tablet (20 mg total) by mouth daily. 30 tablet 11   meclizine (ANTIVERT) 25 MG tablet Take 1 tablet (25 mg total) by mouth 3 (three) times daily as needed for dizziness. 60 tablet 1   metFORMIN (GLUCOPHAGE-XR) 500 MG 24 hr tablet Take 4 tablets (2,000 mg total) by mouth at bedtime. 360 tablet 3   Multiple Vitamin (MULTIVITAMIN WITH MINERALS) TABS tablet Take 1 tablet by mouth every evening.      omeprazole (PRILOSEC) 20 MG capsule Take 1 capsule (20 mg total) by mouth daily. (Patient taking differently: Take 20 mg by mouth daily as needed (acid reflux).) 30 capsule 1   ondansetron (ZOFRAN) 8 MG tablet Take 1 tablet (8 mg total) by mouth 2 (two) times daily as needed. Start on the third day after chemotherapy. 30 tablet 1   prochlorperazine (COMPAZINE) 10 MG tablet Take 1 tablet (10 mg total) by mouth every 6 (six) hours as needed (Nausea or vomiting). 30 tablet 1   Semaglutide, 2 MG/DOSE, 8 MG/3ML SOPN Inject 2 mg into the skin once a week. 10 mL 3   traMADol (ULTRAM) 50 MG tablet Take 2 tablets (100 mg total) by mouth every 6 (six) hours as needed for severe pain. 60 tablet 0   traZODone (DESYREL) 50 MG tablet Take 1 tablet (50 mg total) by mouth at bedtime as needed for sleep. 30 tablet 1   No current facility-administered medications for this visit.    SUMMARY OF ONCOLOGIC HISTORY: Oncology History Overview Note  P53 wild type   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([place])  02/01/2022 Initial Diagnosis   She presented with postmenopausal bleeding   02/07/2022 Imaging   US pelvis Thickened heterogeneous endometrium with masslike region with internal vascularity. Findings may be  associated with endometrial carcinoma or hyperplasia. Biopsy is recommended for further evaluation.   02/22/2022 Initial Biopsy   EMB: MMMT/carcinosarcoma (predominance of sarcomatous component)   03/05/2022 Imaging   Ct chest, abdomen and pelvis 1. Heterogeneous enlargement of the endometrial cavity is compatible with the reported history of uterine carcinosarcoma. No definite involvement of the posterior bladder wall or sigmoid colon/rectum. 2. 9 mm short axis right external iliac node is upper normal for size. Attention on follow-up recommended. Otherwise no lymphadenopathy in the abdomen or pelvis. 3. 3.6 x 2.7 cm lesion in the lateral segment left liver has subtle peripheral nodular enhancement. This is almost certainly a benign cavernous hemangioma, but MRI abdomen with and without contrast recommended to confirm. 4. Aortic Atherosclerosis (ICD10-I70.0).   03/08/2022 Initial Diagnosis   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([place])   03/30/2022 Surgery   TRH/BSO, SLN biopsy on right, left pelvic LND, LOS, mini-lap  Findings: On EUA, 12cm minimally mobile uterus. On intra-abdominal exam, normal upper abdominal survey. Omentum adherent to the anterior abdominal wall along the prior midline incision. Normal omentum otherwise, normal small and large  bowel. 12 cm uterus densely adherent to the anterior abdominal wall, obliterating some of the anterior anatomy including the anterior cul de sac. Normal appearing bilateral adnexa. Mapping successful to right obturator SLN, mildly enlarged. Dye seen within the parametrium on the left, no SLNs identified. No obvious adenopathy on the left. Decision made given length of surgery, comorbitidies to defer left para-aortic lymphadenectomy. Mini-lap required for specimen delivery. Dome intact on cystoscopy and good efflux noted from bilateral ureteral orifices.   03/30/2022 Pathology Results   MMMT/carcinosarcoma, 6.3 cm MI 1cm or 3 (<50%) Cervical stroma, bilateral  tubes/ovaries benign R SLN and L pelvic LNDs negative  ONCOLOGY TABLE:   UTERUS, CARCINOMA OR CARCINOSARCOMA: Resection   Procedure: Total hysterectomy and bilateral salpingo-oophorectomy  Histologic Type:  Malignant mixed Mullerian tumor (MMMT/ carcinosarcoma)  Histologic Grade: High-grade  Myometrial Invasion:       Depth of Myometrial Invasion (mm): 10 mm       Myometrial Thickness (mm): 30 mm       Percentage of Myometrial Invasion: 33%  Uterine Serosa Involvement: Not identified  Cervical stromal Involvement: Not identified  Extent of involvement of other tissue/organs: Not identified  Peritoneal/Ascitic Fluid: Not applicable  Lymphovascular Invasion: Not identified  Regional Lymph Nodes:       Pelvic Lymph Nodes Examined:                                   1 Sentinel                                   6 Non-sentinel                                   7 Total       Pelvic Lymph Nodes with Metastasis: 0                           Macrometastasis: (>2.0 mm): 0                           Micrometastasis: (>0.2 mm and < 2.0 mm): 0                           Isolated Tumor Cells (<0.2 mm): 0                           Laterality of Lymph Node with Tumor: Not  applicable                           Extracapsular Extension: Not applicable       Para-aortic Lymph Nodes Examined:                                    0 Sentinel                                    0 non-sentinel  0 total  Distant Metastasis:       Distant Site(s) Involved: Not applicable  Pathologic Stage Classification (pTNM, AJCC 8th Edition): pT1a, pN0  Ancillary Studies: MMR / MSI testing will be ordered  Representative Tumor Block: B1  Comment(s): Pancytokeratin was performed on the lymph nodes and is  negative.    04/08/2022 Cancer Staging   Staging form: Corpus Uteri - Carcinoma and Carcinosarcoma, AJCC 8th Edition - Pathologic stage from 04/08/2022: FIGO Stage IA (pT1a, pN0, cM0) -  Signed by Heath Lark, MD on 04/08/2022 Stage prefix: Initial diagnosis   04/21/2022 Procedure   Placement of single lumen port a cath via right internal jugular vein. The catheter tip lies at the cavo-atrial junction. A power injectable port a cath was placed and is ready for immediate use.       04/29/2022 -  Chemotherapy   Patient is on Treatment Plan : UTERINE Carboplatin AUC 6 / Paclitaxel q21d       PHYSICAL EXAMINATION: ECOG PERFORMANCE STATUS: 0 - Asymptomatic  Vitals:   06/09/22 0933  BP: (!) 144/71  Pulse: 95  Resp: 18  SpO2: 99%   Filed Weights   06/09/22 0933  Weight: 274 lb (124.3 kg)    GENERAL:alert, no distress and comfortable NEURO: alert & oriented x 3 with fluent speech, no focal motor/sensory deficits  LABORATORY DATA:  I have reviewed the data as listed    Component Value Date/Time   NA 141 06/09/2022 0913   NA 139 09/22/2021 0954   K 4.3 06/09/2022 0913   CL 107 06/09/2022 0913   CO2 25 06/09/2022 0913   GLUCOSE 106 (H) 06/09/2022 0913   BUN 12 06/09/2022 0913   BUN 13 09/22/2021 0954   CREATININE 0.63 06/09/2022 0913   CREATININE 0.67 04/23/2015 1645   CALCIUM 9.7 06/09/2022 0913   PROT 7.8 06/09/2022 0913   PROT 7.3 09/22/2021 0954   ALBUMIN 4.4 06/09/2022 0913   ALBUMIN 4.4 09/22/2021 0954   AST 17 06/09/2022 0913   ALT 15 06/09/2022 0913   ALKPHOS 88 06/09/2022 0913   BILITOT 0.4 06/09/2022 0913   GFRNONAA >60 06/09/2022 0913   GFRNONAA >89 01/06/2014 1643   GFRAA >60 02/25/2020 0334   GFRAA >89 01/06/2014 1643    No results found for: "SPEP", "UPEP"  Lab Results  Component Value Date   WBC 5.4 06/09/2022   NEUTROABS 4.3 06/09/2022   HGB 11.1 (L) 06/09/2022   HCT 33.9 (L) 06/09/2022   MCV 94.2 06/09/2022   PLT 228 06/09/2022      Chemistry      Component Value Date/Time   NA 141 06/09/2022 0913   NA 139 09/22/2021 0954   K 4.3 06/09/2022 0913   CL 107 06/09/2022 0913   CO2 25 06/09/2022 0913   BUN 12 06/09/2022 0913    BUN 13 09/22/2021 0954   CREATININE 0.63 06/09/2022 0913   CREATININE 0.67 04/23/2015 1645      Component Value Date/Time   CALCIUM 9.7 06/09/2022 0913   ALKPHOS 88 06/09/2022 0913   AST 17 06/09/2022 0913   ALT 15 06/09/2022 0913   BILITOT 0.4 06/09/2022 0913

## 2022-06-09 NOTE — Patient Instructions (Signed)
[facility] MEDICAL ONCOLOGY  Discharge Instructions: Thank you for choosing [facility] to provide your oncology and hematology care.   If you have a lab appointment with the Cancer Center, please go directly to the Cancer Center and check in at the registration area.   Wear comfortable clothing and clothing appropriate for easy access to any Portacath or PICC line.   We strive to give you quality time with your provider. You may need to reschedule your appointment if you arrive late (15 or more minutes).  Arriving late affects you and other patients whose appointments are after yours.  Also, if you miss three or more appointments without notifying the office, you may be dismissed from the clinic at the provider's discretion.      For prescription refill requests, have your pharmacy contact our office and allow 72 hours for refills to be completed.    Today you received the following chemotherapy and/or immunotherapy agents: Paclitaxel and Carboplatin      To help prevent nausea and vomiting after your treatment, we encourage you to take your nausea medication as directed.  BELOW ARE SYMPTOMS THAT SHOULD BE REPORTED IMMEDIATELY: *FEVER GREATER THAN 100.4 F (38 C) OR HIGHER *CHILLS OR SWEATING *NAUSEA AND VOMITING THAT IS NOT CONTROLLED WITH YOUR NAUSEA MEDICATION *UNUSUAL SHORTNESS OF BREATH *UNUSUAL BRUISING OR BLEEDING *URINARY PROBLEMS (pain or burning when urinating, or frequent urination) *BOWEL PROBLEMS (unusual diarrhea, constipation, pain near the anus) TENDERNESS IN MOUTH AND THROAT WITH OR WITHOUT PRESENCE OF ULCERS (sore throat, sores in mouth, or a toothache) UNUSUAL RASH, SWELLING OR PAIN  UNUSUAL VAGINAL DISCHARGE OR ITCHING   Items with * indicate a potential emergency and should be followed up as soon as possible or go to the Emergency Department if any problems should occur.  Please show the CHEMOTHERAPY ALERT CARD or IMMUNOTHERAPY ALERT  CARD at check-in to the Emergency Department and triage nurse.  Should you have questions after your visit or need to cancel or reschedule your appointment, please contact [facility]  Dept: [phone]  and follow the prompts.  Office hours are 8:00 a.m. to 4:30 p.m. Monday - Friday. Please note that voicemails left after 4:00 p.m. may not be returned until the following business day.  We are closed weekends and major holidays. You have access to a nurse at all times for urgent questions. Please call the main number to the clinic Dept: [phone] and follow the prompts.   For any non-urgent questions, you may also contact your provider using MyChart. We now offer e-Visits for anyone 18 and older to request care online for non-urgent symptoms. For details visit [email or web address].   Also download the MyChart app! Go to the app store, search "MyChart", open the app, select [facility], and log in with your MyChart username and password.  Due to Covid, a mask is required upon entering the hospital/clinic. If you do not have a mask, one will be given to you upon arrival. For doctor visits, patients may have 1 support person aged 18 or older with them. For treatment visits, patients cannot have anyone with them due to current Covid guidelines and our immunocompromised population.   

## 2022-06-10 NOTE — Progress Notes (Signed)
  Radiation Oncology         [phone] ________________________________  Name: [name] MRN: [id number]  Date: 06/13/2022  DOB: [birth year]  CC: Masters, [name], DO  [name], MD  HDR BRACHYTHERAPY NOTE  DIAGNOSIS: The encounter diagnosis was 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[facility]).   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 She will be treated with a 3.0 cm diameter segmented cylinder. This conforms to her anatomy without undue discomfort.  Vaginal brachytherapy procedure node: The patient was brought to the [place] suite. Identity was confirmed. All relevant records and images related to the planned course of therapy were reviewed. The patient freely provided informed written consent to proceed with treatment after reviewing the details related to the planned course of therapy. The consent form was witnessed and verified by the simulation staff. Then, the patient was set-up in a stable reproducible supine position for radiation therapy. Pelvic exam revealed the vaginal cuff to be intact . The patient's custom vaginal cylinder was placed in the proximal vagina. This was affixed to the CT/MR stabilization plate to prevent slippage. Patient tolerated the placement well.  Verification simulation note:  A fiducial marker was placed within the vaginal cylinder. An AP and lateral film was then obtained through the pelvis area. This documented accurate position of the vaginal cylinder for treatment.  HDR BRACHYTHERAPY TREATMENT  The remote afterloading device was affixed to the vaginal cylinder by catheter. Patient then proceeded to  undergo her first high-dose-rate treatment directed at the proximal vagina. The patient was prescribed a dose of 6.0 gray to be delivered to the mucosal surface. Treatment length was 3.0 cm. Patient was treated with 1 channel using 7 dwell positions. Treatment time was 411.2 seconds. Iridium 192 was the high-dose-rate source for treatment. The patient tolerated the treatment well. After completion of her therapy, a radiation survey was performed documenting return of the iridium source into the GammaMed safe.   PLAN: The patient will return next week for her second high-dose-rate treatment.  ________________________________  -----------------------------------  Blair Promise, PhD, MD  This document serves as a record of services personally performed by Gery Pray, MD. It was created on his behalf by Roney Mans, a trained medical scribe. The creation of this record is based on the scribe's personal observations and the provider's statements to them. This document has been checked and approved by the attending provider.

## 2022-06-13 ENCOUNTER — Other Ambulatory Visit: Payer: Self-pay

## 2022-06-13 ENCOUNTER — Ambulatory Visit
Admission: RE | Admit: 2022-06-13 | Discharge: 2022-06-13 | Disposition: A | Discharge status: Home / Self Care | Payer: Medicaid Other | Source: Ambulatory Visit | Attending: Radiation Oncology | Admitting: Radiation Oncology

## 2022-06-13 ENCOUNTER — Encounter: Payer: Self-pay | Admitting: Radiation Oncology

## 2022-06-13 DIAGNOSIS — C541 Malignant neoplasm of endometrium: Secondary | ICD-10-CM

## 2022-06-13 LAB — RAD ONC ARIA SESSION SUMMARY
Course Elapsed Days: 0
Plan Fractions Treated to Date: 1
Plan Prescribed Dose Per Fraction: 6 Gy
Plan Total Fractions Prescribed: 5
Plan Total Prescribed Dose: 30 Gy
Reference Point Dosage Given to Date: 6 Gy
Reference Point Session Dosage Given: 6 Gy
Session Number: 1

## 2022-06-13 NOTE — Progress Notes (Signed)
[name] is here today for new vaginal cylinder.    Does the patient complain of any of the following:  Pain:Patient states she continues to have  body aches related to chemo. Patient taking Tramadol which has been effective.  Abdominal bloating: No Diarrhea/Constipation: Diarrhea Nausea/Vomiting: No Vaginal Discharge: No Blood in Urine or Stool: No  Urinary Issues (dysuria/incomplete emptying/ incontinence/ increased frequency/urgency): No   Additional comments if applicable:  BP 199/14 (BP Location: Left Arm, Patient Position: Sitting)   Pulse 100   Temp 98.2 F (36.8 C) (Oral)   Resp 18   Ht '6\' 1"'$  (1.854 m)   Wt 271 lb 4 oz (123 kg)   LMP 06/23/2014   SpO2 100%   BMI 35.79 kg/m

## 2022-06-14 ENCOUNTER — Encounter: Payer: Self-pay | Admitting: Internal Medicine

## 2022-06-14 ENCOUNTER — Encounter: Payer: Medicaid Other | Admitting: Internal Medicine

## 2022-06-14 NOTE — Progress Notes (Deleted)
[name] is a 61 year old with endometrial carcinoma and type 2 diabetes  Endometrial carcinosarcoma Diagnosed in March with stage Ia endometrial cancer.  Total hysterectomy and bilateral salpingo-oophorectomy in May. She is currently undergoing chemotherapy with carboplatin/packed low Taxol.   Diabetes Medications include Lantus 80 units nightly, lispro 20 to 22 units with meals, metformin 2000 mg daily and Ozempic 2 g weekly.  She is currently receiving dexamethasone prior to chemotherapy treatments. She has a Dexcom but has not been using after surgeries.

## 2022-06-17 ENCOUNTER — Telehealth: Payer: Self-pay | Admitting: *Deleted

## 2022-06-17 NOTE — Telephone Encounter (Signed)
Called patient to  remind of HDR Tx. for 06-20-22 @ 9 am, spoke with patient and she is aware  of this treatmenet

## 2022-06-19 NOTE — Progress Notes (Signed)
  Radiation Oncology         [phone] ________________________________  Name: [name] MRN: [id number]  Date: 06/20/2022  DOB: 1961/06/04  CC: Masters, [name], DO  [name], MD  HDR BRACHYTHERAPY NOTE  DIAGNOSIS: The encounter diagnosis was 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[facility]).   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 She will be treated with a 3.0 cm diameter segmented cylinder. This conforms to her anatomy without undue discomfort.  Vaginal brachytherapy procedure node: The patient was brought to the [place] suite. Identity was confirmed. All relevant records and images related to the planned course of therapy were reviewed. The patient freely provided informed written consent to proceed with treatment after reviewing the details related to the planned course of therapy. The consent form was witnessed and verified by the simulation staff. Then, the patient was set-up in a stable reproducible supine position for radiation therapy. Pelvic exam revealed the vaginal cuff to be intact . The patient's custom vaginal cylinder was placed in the proximal vagina. This was affixed to the CT/MR stabilization plate to prevent slippage. Patient tolerated the placement well.  Verification simulation note:  A fiducial marker was placed within the vaginal cylinder. An AP and lateral film was then obtained through the pelvis area. This documented accurate position of the vaginal cylinder for treatment.  HDR BRACHYTHERAPY TREATMENT  The remote afterloading device was affixed to the vaginal cylinder by catheter. Patient then proceeded to  undergo her second high-dose-rate treatment directed at the proximal vagina. The patient was prescribed a dose of 6.0 gray to be delivered to the mucosal surface. Treatment length was 3.0 cm. Patient was treated with 1 channel using 7 dwell positions. Treatment time was 439.2 seconds. Iridium 192 was the high-dose-rate source for treatment. The patient tolerated the treatment well. After completion of her therapy, a radiation survey was performed documenting return of the iridium source into the GammaMed safe.   PLAN: The patient will return next week for her third high-dose-rate treatment.  ________________________________  -----------------------------------  Blair Promise, PhD, MD  This document serves as a record of services personally performed by Gery Pray, MD. It was created on his behalf by Roney Mans, a trained medical scribe. The creation of this record is based on the scribe's personal observations and the provider's statements to them. This document has been checked and approved by the attending provider.

## 2022-06-20 ENCOUNTER — Ambulatory Visit
Admission: RE | Admit: 2022-06-20 | Discharge: 2022-06-20 | Disposition: A | Discharge status: Home / Self Care | Payer: Medicaid Other | Source: Ambulatory Visit | Attending: Radiation Oncology | Admitting: Radiation Oncology

## 2022-06-20 ENCOUNTER — Other Ambulatory Visit: Payer: Self-pay

## 2022-06-20 DIAGNOSIS — C541 Malignant neoplasm of endometrium: Secondary | ICD-10-CM

## 2022-06-20 LAB — RAD ONC ARIA SESSION SUMMARY
Course Elapsed Days: 7
Plan Fractions Treated to Date: 2
Plan Prescribed Dose Per Fraction: 6 Gy
Plan Total Fractions Prescribed: 5
Plan Total Prescribed Dose: 30 Gy
Reference Point Dosage Given to Date: 12 Gy
Reference Point Session Dosage Given: 6 Gy
Session Number: 2

## 2022-06-21 IMAGING — MR MR FOOT*R* W/O CM
6 series · 40 of 40 positions shown · non-contrast
Comparison: X-ray 04/14/2021

CLINICAL DATA: Diabetic foot wound

EXAM:
MRI OF THE RIGHT FOREFOOT WITHOUT CONTRAST
TECHNIQUE: Multiplanar, multisequence MR imaging of the right forefoot was
performed. No intravenous contrast was administered.

[Series 4: T1 · coronal · right · 3.0mm · 0.47mm/px · 8 of 48 slices shown (1 of 2)]
[im 1/48]
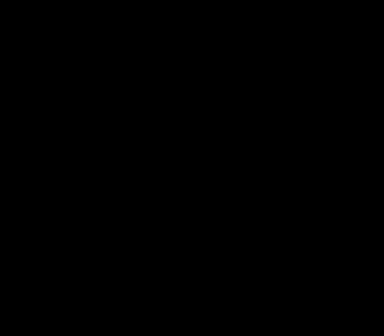
[im 7/48]
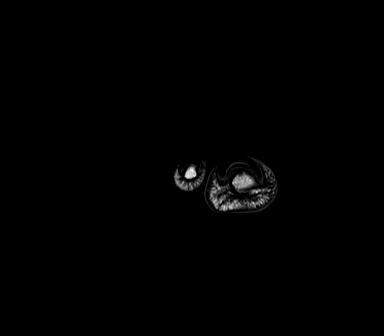
[im 14/48]
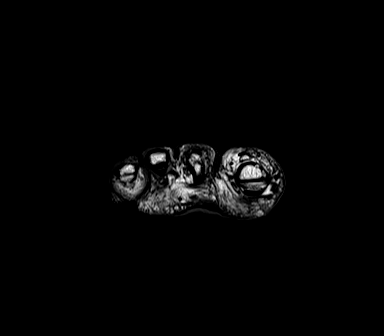
[im 21/48]
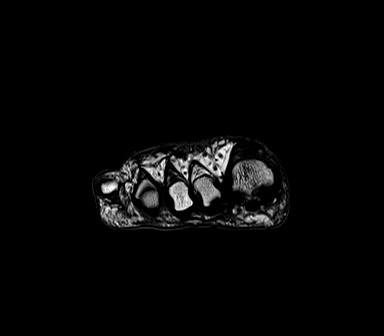
[im 27/48]
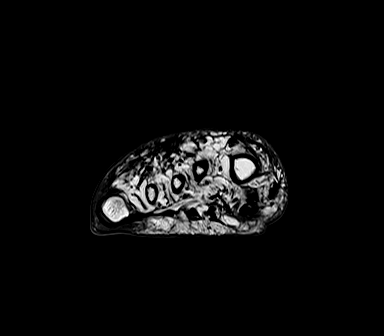
[im 34/48]
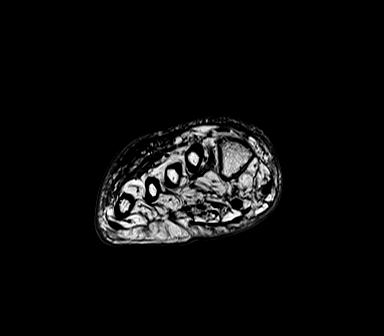
[im 41/48]
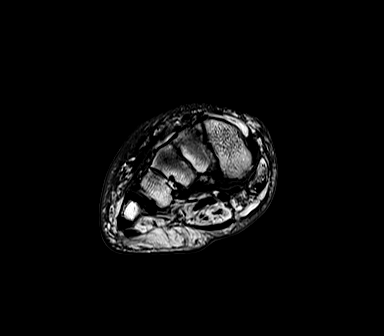
[im 48/48]
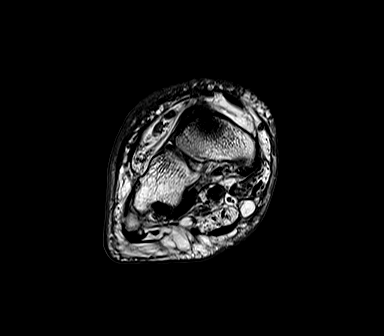

[Series 5: T2 fat-sat · coronal · right · 3.0mm · 0.49mm/px · 8 of 48 slices shown (1 of 2)]
[im 1/48]
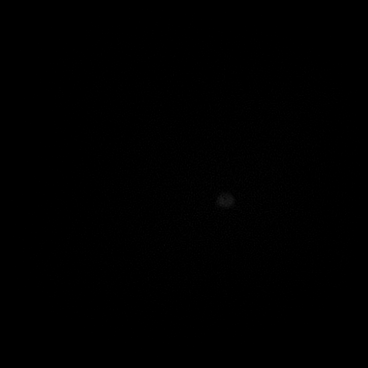
[im 7/48]
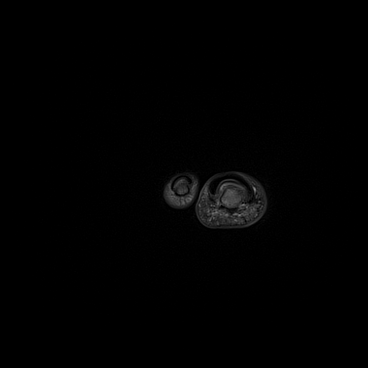
[im 14/48]
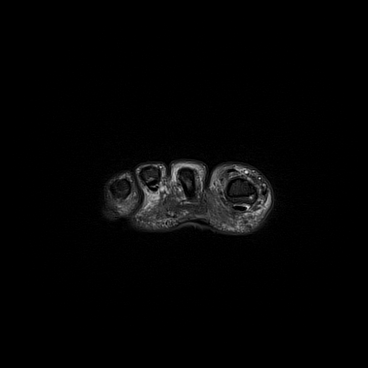
[im 21/48]
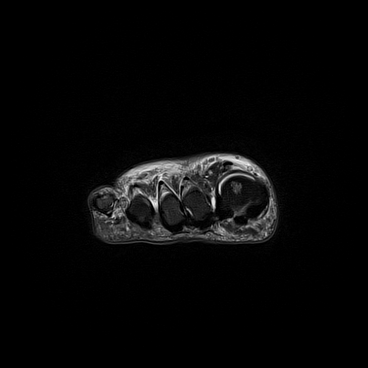
[im 27/48]
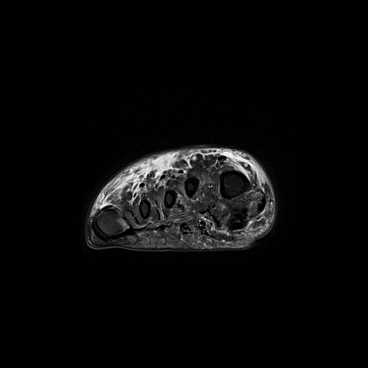
[im 34/48]
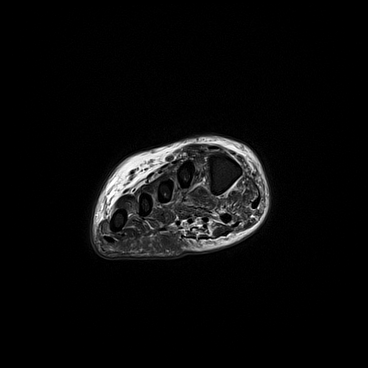
[im 41/48]
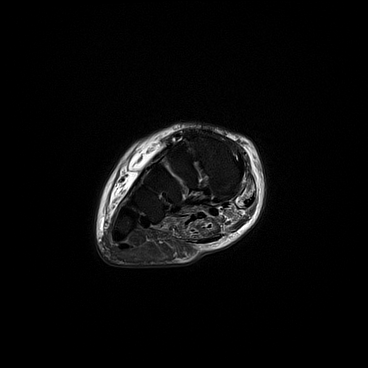
[im 48/48]
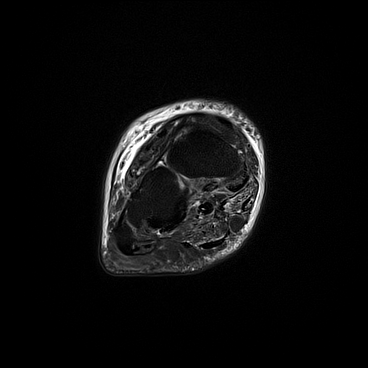

[Series 6: T1 · axial · right · 3.0mm · 0.52mm/px · z∈[-105,-4]mm · 5 of 28 slices shown (2 of 2)]
[im 1/28]
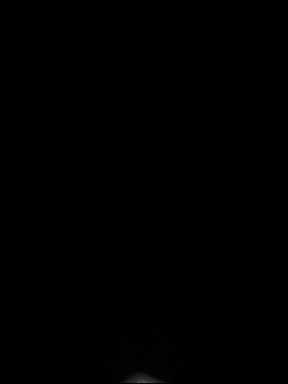
[im 7/28]
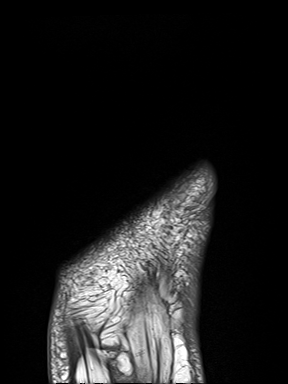
[im 14/28]
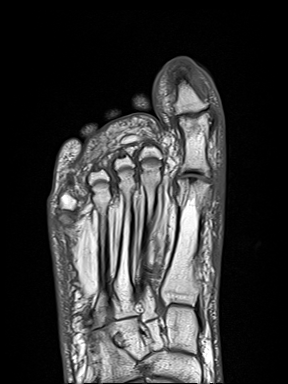
[im 21/28]
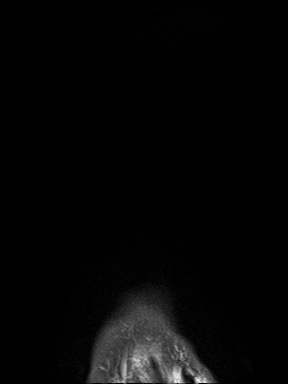
[im 28/28]
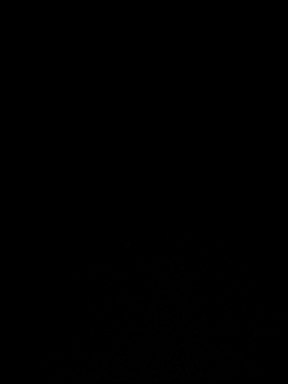

[Series 8: STIR · sagittal · right · 3.0mm · 0.70mm/px · 6 of 33 slices shown]
[im 1/33]
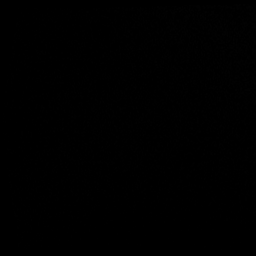
[im 7/33]
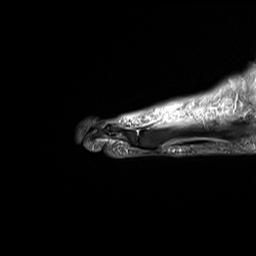
[im 13/33]
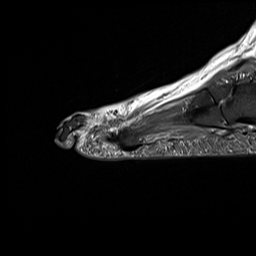
[im 20/33]
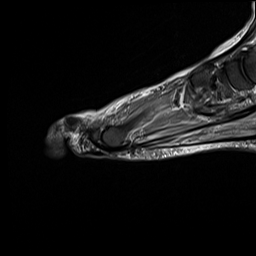
[im 26/33]
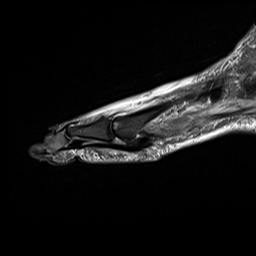
[im 33/33]
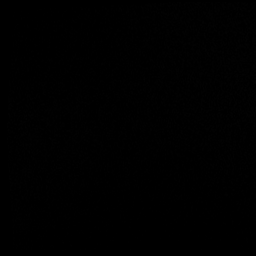

[Series 9: T2 fat-sat · axial · right · 3.0mm · 0.60mm/px · z∈[-104,-4]mm · 5 of 28 slices shown (2 of 2)]
[im 1/28]
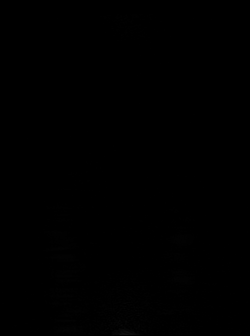
[im 7/28]
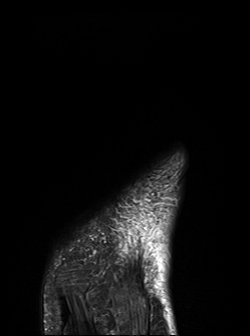
[im 14/28]
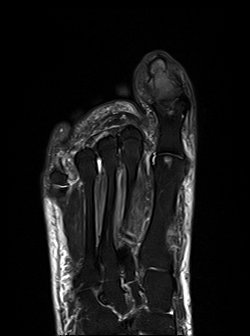
[im 21/28]
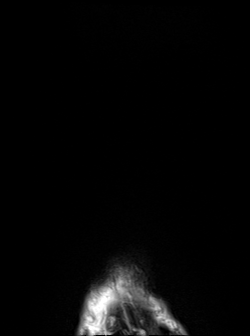
[im 28/28]
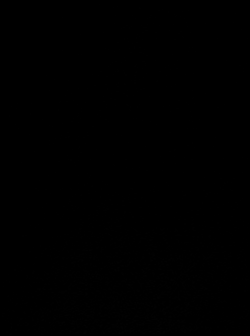

[Series 10: T1 fat-sat · coronal · non-contrast · right · 3.0mm · 0.66mm/px · 8 of 48 slices shown]
[im 1/48]
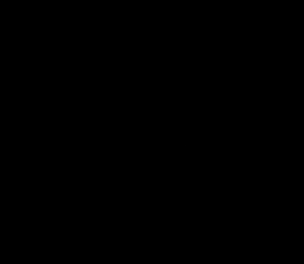
[im 7/48]
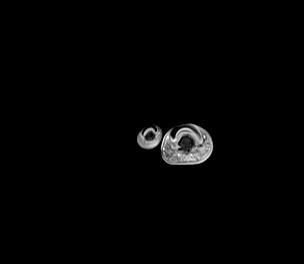
[im 14/48]
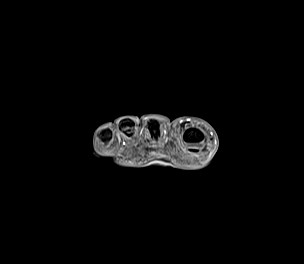
[im 21/48]
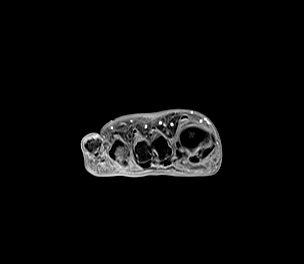
[im 27/48]
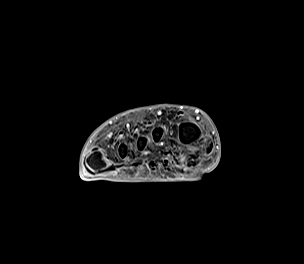
[im 34/48]
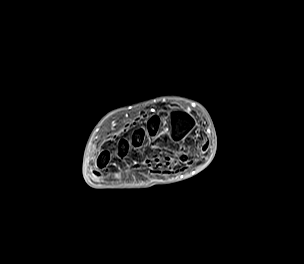
[im 41/48]
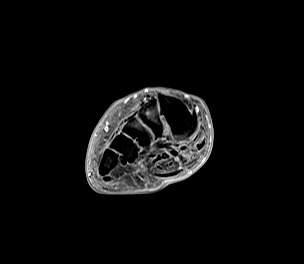
[im 48/48]
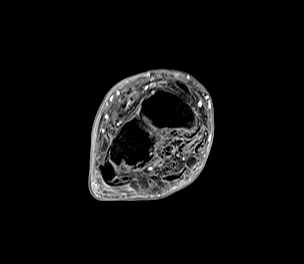

[40 of 40 positions shown; findings below may reference images not displayed]

FINDINGS: Bones/Joint/Cartilage

Bone marrow edema throughout the distal phalanx of the right great
toe (series 9, images 14-15). No definite erosion or cortical
destruction. Intermediate T1 signal within the distal tuft (series
4, image 45) is suspicious for early acute osteomyelitis. Trace
joint fluid within the interphalangeal joint of the great toe, which
may be reactive. Preserved bone marrow signal within the great toe
proximal phalanx. Remaining osseous structures are within normal
limits. No fracture. No malalignment. No additional sites of bone
marrow edema or marrow replacement. Mild degenerative changes of the
forefoot, most pronounced at the first MTP joint.

Ligaments

Intact Lisfranc ligament. Collateral ligaments of the forefoot
appear intact.

Muscles and Tendons

Chronic denervation changes of the intrinsic foot musculature.
Intact flexor and extensor tendons. No tenosynovitis.

Soft tissues

Soft tissue swelling of the great toe and dorsal forefoot.
Nonspecific subcutaneous edema throughout the dorsal forefoot. No
organized or drainable fluid collections.
IMPRESSION: 1. Bone marrow edema throughout the right great toe distal phalanx
with findings suggestive of early acute osteomyelitis involving the
distal tuft.
2. Trace joint fluid within the interphalangeal joint of the great
toe, which may be reactive. Septic arthritis not excluded.
3. Subcutaneous edema most pronounced at the dorsal aspect of the
forefoot. No organized or drainable fluid collections.

## 2022-06-22 ENCOUNTER — Inpatient Hospital Stay (HOSPITAL_COMMUNITY)
Admission: EM | Admit: 2022-06-22 | Discharge: 2022-06-25 | DRG: 872 | Disposition: A | Discharge status: Home / Self Care | Payer: Medicaid Other | Attending: Student in an Organized Health Care Education/Training Program | Admitting: Student in an Organized Health Care Education/Training Program

## 2022-06-22 ENCOUNTER — Other Ambulatory Visit: Payer: Self-pay

## 2022-06-22 ENCOUNTER — Emergency Department (HOSPITAL_COMMUNITY): Payer: Medicaid Other

## 2022-06-22 DIAGNOSIS — Z886 Allergy status to analgesic agent status: Secondary | ICD-10-CM

## 2022-06-22 DIAGNOSIS — D638 Anemia in other chronic diseases classified elsewhere: Secondary | ICD-10-CM | POA: Diagnosis present

## 2022-06-22 DIAGNOSIS — E861 Hypovolemia: Secondary | ICD-10-CM | POA: Diagnosis present

## 2022-06-22 DIAGNOSIS — Z79899 Other long term (current) drug therapy: Secondary | ICD-10-CM

## 2022-06-22 DIAGNOSIS — Z20822 Contact with and (suspected) exposure to covid-19: Secondary | ICD-10-CM | POA: Diagnosis present

## 2022-06-22 DIAGNOSIS — Z833 Family history of diabetes mellitus: Secondary | ICD-10-CM

## 2022-06-22 DIAGNOSIS — R6521 Severe sepsis with septic shock: Secondary | ICD-10-CM | POA: Diagnosis present

## 2022-06-22 DIAGNOSIS — A419 Sepsis, unspecified organism: Principal | ICD-10-CM | POA: Diagnosis present

## 2022-06-22 DIAGNOSIS — E1165 Type 2 diabetes mellitus with hyperglycemia: Secondary | ICD-10-CM | POA: Diagnosis not present

## 2022-06-22 DIAGNOSIS — Z7984 Long term (current) use of oral hypoglycemic drugs: Secondary | ICD-10-CM

## 2022-06-22 DIAGNOSIS — Z880 Allergy status to penicillin: Secondary | ICD-10-CM

## 2022-06-22 DIAGNOSIS — K219 Gastro-esophageal reflux disease without esophagitis: Secondary | ICD-10-CM | POA: Diagnosis present

## 2022-06-22 DIAGNOSIS — E1140 Type 2 diabetes mellitus with diabetic neuropathy, unspecified: Secondary | ICD-10-CM

## 2022-06-22 DIAGNOSIS — C541 Malignant neoplasm of endometrium: Secondary | ICD-10-CM | POA: Diagnosis present

## 2022-06-22 DIAGNOSIS — N179 Acute kidney failure, unspecified: Secondary | ICD-10-CM

## 2022-06-22 DIAGNOSIS — D849 Immunodeficiency, unspecified: Secondary | ICD-10-CM | POA: Diagnosis present

## 2022-06-22 DIAGNOSIS — R55 Syncope and collapse: Secondary | ICD-10-CM | POA: Diagnosis present

## 2022-06-22 DIAGNOSIS — Z794 Long term (current) use of insulin: Secondary | ICD-10-CM

## 2022-06-22 DIAGNOSIS — E785 Hyperlipidemia, unspecified: Secondary | ICD-10-CM | POA: Diagnosis present

## 2022-06-22 DIAGNOSIS — E119 Type 2 diabetes mellitus without complications: Secondary | ICD-10-CM

## 2022-06-22 DIAGNOSIS — I10 Essential (primary) hypertension: Secondary | ICD-10-CM | POA: Diagnosis present

## 2022-06-22 DIAGNOSIS — E1142 Type 2 diabetes mellitus with diabetic polyneuropathy: Secondary | ICD-10-CM

## 2022-06-22 DIAGNOSIS — E1152 Type 2 diabetes mellitus with diabetic peripheral angiopathy with gangrene: Secondary | ICD-10-CM | POA: Diagnosis present

## 2022-06-22 DIAGNOSIS — J45909 Unspecified asthma, uncomplicated: Secondary | ICD-10-CM | POA: Diagnosis present

## 2022-06-22 LAB — COMPREHENSIVE METABOLIC PANEL
ALT: 16 U/L (ref 0–44)
AST: 17 U/L (ref 15–41)
Albumin: 3.3 g/dL — ABNORMAL LOW (ref 3.5–5.0)
Alkaline Phosphatase: 82 U/L (ref 38–126)
Anion gap: 12 (ref 5–15)
BUN: 24 mg/dL — ABNORMAL HIGH (ref 6–20)
CO2: 20 mmol/L — ABNORMAL LOW (ref 22–32)
Calcium: 9.2 mg/dL (ref 8.9–10.3)
Chloride: 99 mmol/L (ref 98–111)
Creatinine, Ser: 3.28 mg/dL — ABNORMAL HIGH (ref 0.44–1.00)
GFR, Estimated: 16 mL/min — ABNORMAL LOW (ref >60)
Glucose, Bld: 144 mg/dL — ABNORMAL HIGH (ref 70–99)
Potassium: 4.5 mmol/L (ref 3.5–5.1)
Sodium: 131 mmol/L — ABNORMAL LOW (ref 135–145)
Total Bilirubin: 0.6 mg/dL (ref 0.3–1.2)
Total Protein: 6.9 g/dL (ref 6.5–8.1)

## 2022-06-22 LAB — APTT: aPTT: 30 seconds (ref 24–36)

## 2022-06-22 LAB — PROTIME-INR
INR: 1.1 (ref 0.8–1.2)
Prothrombin Time: 14.4 seconds (ref 11.4–15.2)

## 2022-06-22 LAB — CBC WITH DIFFERENTIAL/PLATELET
Abs Immature Granulocytes: 0.08 10*3/uL — ABNORMAL HIGH (ref 0.00–0.07)
Basophils Absolute: 0 10*3/uL (ref 0.0–0.1)
Basophils Relative: 0 %
Eosinophils Absolute: 0 10*3/uL (ref 0.0–0.5)
Eosinophils Relative: 0 %
HCT: 28.1 % — ABNORMAL LOW (ref 36.0–46.0)
Hemoglobin: 9.2 g/dL — ABNORMAL LOW (ref 12.0–15.0)
Immature Granulocytes: 2 %
Lymphocytes Relative: 29 %
Lymphs Abs: 1.2 10*3/uL (ref 0.7–4.0)
MCH: 31.1 pg (ref 26.0–34.0)
MCHC: 32.7 g/dL (ref 30.0–36.0)
MCV: 94.9 fL (ref 80.0–100.0)
Monocytes Absolute: 0.5 10*3/uL (ref 0.1–1.0)
Monocytes Relative: 12 %
Neutro Abs: 2.5 10*3/uL (ref 1.7–7.7)
Neutrophils Relative %: 57 %
Platelets: 159 10*3/uL (ref 150–400)
RBC: 2.96 MIL/uL — ABNORMAL LOW (ref 3.87–5.11)
RDW: 14.7 % (ref 11.5–15.5)
Smear Review: NORMAL
WBC: 4.3 10*3/uL (ref 4.0–10.5)
nRBC: 0 % (ref 0.0–0.2)

## 2022-06-22 MED ORDER — VANCOMYCIN HCL 10 G IV SOLR
2250.0000 mg | Freq: Once | INTRAVENOUS | Status: AC
  Administered 2022-06-23: 2250 mg via INTRAVENOUS
  Filled 2022-06-22: qty 22.5

## 2022-06-22 MED ORDER — SODIUM CHLORIDE 0.9 % IV SOLN
2.0000 g | Freq: Three times a day (TID) | INTRAVENOUS | Status: DC
  Administered 2022-06-23 (×2): 2 g via INTRAVENOUS
  Filled 2022-06-22 (×2): qty 12.5

## 2022-06-22 MED ORDER — VANCOMYCIN HCL 10 G IV SOLR
2250.0000 mg | Freq: Once | INTRAVENOUS | Status: DC
  Filled 2022-06-22: qty 22.5

## 2022-06-22 MED ORDER — SODIUM CHLORIDE 0.9% FLUSH: 10.0000 mL | INTRAVENOUS | Status: DC | PRN

## 2022-06-22 MED ORDER — CHLORHEXIDINE GLUCONATE CLOTH 2 % EX PADS
6.0000 | MEDICATED_PAD | Freq: Every day | CUTANEOUS | Status: DC
Start: 2022-06-23 — End: 2022-06-25

## 2022-06-22 MED ORDER — ACETAMINOPHEN 500 MG PO TABS
1000.0000 mg | ORAL_TABLET | Freq: Once | ORAL | Status: AC
  Administered 2022-06-22: 1000 mg via ORAL
  Filled 2022-06-22: qty 2

## 2022-06-22 MED ORDER — METRONIDAZOLE 500 MG/100ML IV SOLN
500.0000 mg | Freq: Once | INTRAVENOUS | Status: AC
  Administered 2022-06-23: 500 mg via INTRAVENOUS
  Filled 2022-06-22: qty 100

## 2022-06-22 MED ORDER — LACTATED RINGERS IV BOLUS (SEPSIS)
3000.0000 mL | Freq: Once | INTRAVENOUS | Status: AC
  Administered 2022-06-22: 3000 mL via INTRAVENOUS

## 2022-06-22 MED ORDER — LACTATED RINGERS IV SOLN: INTRAVENOUS | Status: AC

## 2022-06-22 MED ORDER — VANCOMYCIN HCL 1250 MG/250ML IV SOLN
1250.0000 mg | Freq: Two times a day (BID) | INTRAVENOUS | Status: DC
  Filled 2022-06-22: qty 250

## 2022-06-22 NOTE — Progress Notes (Signed)
Pharmacy Antibiotic Note  [name] is a 61 y.o. female for which pharmacy has been consulted for cefepime and vancomycin dosing for sepsis.  Patient with a history of DM, HTN, Asthma, GERD, Osteomyelitis, carcinosarcoma of the endometrium. Patient presenting with LOC.  SCr 0.63 WBC pending; LA pending; T 100.4 F  Plan: Metronidazole per MD Cefepime 2g q8hr Vancomycin 2250 mg once then 1250 mg q12hr (eAUC 519) unless change in renal function Trend WBC, Fever, Renal function, & Clinical course F/u cultures, clinical course, WBC, fever De-escalate when able Levels at steady state     Temp (24hrs), Avg:100.4 F (38 C), Min:100.4 F (38 C), Max:100.4 F (38 C)  No results for input(s): "WBC", "CREATININE", "LATICACIDVEN", "VANCOTROUGH", "VANCOPEAK", "VANCORANDOM", "GENTTROUGH", "GENTPEAK", "GENTRANDOM", "TOBRATROUGH", "TOBRAPEAK", "TOBRARND", "AMIKACINPEAK", "AMIKACINTROU", "AMIKACIN" in the last 168 hours.  Estimated Creatinine Clearance: 111.4 mL/min (by C-G formula based on SCr of 0.63 mg/dL).    Allergies  Allergen Reactions   Nsaids Nausea And Vomiting    Acid reflux   Penicillins Other (See Comments)    Unknown Did it involve swelling of the face/tongue/throat, SOB, or low BP? Unk Did it involve sudden or severe rash/hives, skin peeling, or any reaction on the inside of your mouth or nose? Unk  Did you need to seek medical attention at a hospital or doctor's office? Unk When did it last happen? Childhood      If all above answers are "NO", may proceed with cephalosporin use.     Antimicrobials this admission: vancomycin 7/26 >>  cefepime 7/26 >>  metronidazole 7/26 >>   Microbiology results: Pending  Thank you for allowing pharmacy to be a part of this patient's care.  Lorelei Pont, PharmD, BCPS 06/22/2022 10:52 PM ED Clinical Pharmacist -  [phone]

## 2022-06-22 NOTE — ED Provider Notes (Signed)
[place] EMERGENCY DEPARTMENT Provider Note   CSN: [id number] Arrival date & time: 06/22/22  2234     History {Add pertinent medical, surgical, social history, OB history to HPI:1} Chief Complaint  Patient presents with   Loss of Consciousness    [name] is a 61 y.o. female.  Pt is a 61 yo female with a pmhx significant for DM, HTN, Asthma, GERD, Osteomyelitis, carcinosarcoma of the endometrium.  [name] initially presented in March with postmenopausal bleeding and had an abnormal [name].  [name] had a TRH/BSO in May.  [name] had a port placed and started chemo on 6/2.  Last chemo on 7/13.  Last radiation on 7/24.  Pt has been feeling bad all week.  [name] said [name]'s been very weak and dizzy.  Today, [name] passed out at dinner.  EMS was called and sbp 60.  EMS started IVFs. Pt unaware [name] had a fever.          Home Medications Prior to Admission medications   Medication Sig Start Date End Date Taking? Authorizing Provider  ACCU-CHEK GUIDE test strip USE UP TO FOUR TIMES DAILY AS DIRECTED 12/01/21   Masters, [name], DO  acetaminophen (TYLENOL) 650 MG CR tablet Take 1,300-1,950 mg by mouth every 8 (eight) hours as needed for pain.    [provider]  atorvastatin (LIPITOR) 80 MG tablet Take 1 tablet (80 mg total) by mouth daily. 09/23/21   Lacinda Axon, MD  blood glucose meter kit and supplies Dispense based on patient and insurance preference. Use up to four times daily as directed. (FOR ICD-10 E10.9, E11.9). 09/28/21   Masters, [name], DO  Continuous Blood Gluc Transmit (DEXCOM G6 TRANSMITTER) MISC Check blood sugar at least 8 times a day 09/22/21   Masters, [name], DO  dexamethasone (DECADRON) 4 MG tablet Take 2 tabs at the night before chemotherapy, every 3 weeks, by mouth x 6 cycles 04/22/22   Heath Lark, MD  gabapentin (NEURONTIN) 300 MG capsule Take 3 capsules (900 mg total) by mouth 3 (three) times daily. 08/10/21   Sanjuan Dame, MD  insulin glargine  (LANTUS SOLOSTAR) 100 UNIT/ML Solostar Pen Inject 80 Units into the skin at bedtime. 03/23/22 06/21/22  Lottie Mussel, MD  insulin lispro (HUMALOG) 100 UNIT/ML injection Inject 16-18 units with breakfast and lunch and 14 units with dinner. 05/18/22 05/18/23  Idamae Schuller, MD  Insulin Pen Needle 31G X 5 MM MISC Use as directed with Lantus and Humalog 09/07/21   Masters, [name], DO  Lancets [facility] DELICA PLUS [id number]) MISC Check blood sugar 3 times a day 09/22/21   Masters, [name], DO  lidocaine-prilocaine (EMLA) cream Apply to affected area once 04/08/22   Heath Lark, MD  lisinopril (ZESTRIL) 20 MG tablet Take 1 tablet (20 mg total) by mouth daily. 12/27/21 12/27/22  [name], MD  meclizine (ANTIVERT) 25 MG tablet Take 1 tablet (25 mg total) by mouth 3 (three) times daily as needed for dizziness. 03/01/22   Masters, Katie, DO  metFORMIN (GLUCOPHAGE-XR) 500 MG 24 hr tablet Take 4 tablets (2,000 mg total) by mouth at bedtime. 04/20/22 04/20/23  Lottie Mussel, MD  Multiple Vitamin (MULTIVITAMIN WITH MINERALS) TABS tablet Take 1 tablet by mouth every evening.     [provider]  omeprazole (PRILOSEC) 20 MG capsule Take 1 capsule (20 mg total) by mouth daily. Patient taking differently: Take 20 mg by mouth daily as needed (acid reflux). 05/04/20   Katherine Roan, MD  ondansetron [facility])  8 MG tablet Take 1 tablet (8 mg total) by mouth 2 (two) times daily as needed. Start on the third day after chemotherapy. 04/08/22   Heath Lark, MD  prochlorperazine (COMPAZINE) 10 MG tablet Take 1 tablet (10 mg total) by mouth every 6 (six) hours as needed (Nausea or vomiting). 04/08/22   Heath Lark, MD  Semaglutide, 2 MG/DOSE, 8 MG/3ML SOPN Inject 2 mg into the skin once a week. 03/16/22   Lottie Mussel, MD  traMADol (ULTRAM) 50 MG tablet Take 2 tablets (100 mg total) by mouth every 6 (six) hours as needed for severe pain. 05/26/22   Heath Lark, MD  traZODone (DESYREL) 50 MG tablet Take 1 tablet (50 mg total) by  mouth at bedtime as needed for sleep. 05/02/22   Masters, Katie, DO      Allergies    Nsaids and Penicillins    Review of Systems   Review of Systems  Neurological:  Positive for weakness.  All other systems reviewed and are negative.   Physical Exam Updated Vital Signs BP (!) 80/51 (BP Location: Right Arm)   Pulse (!) 115   Temp (!) 100.4 F (38 C) (Oral)   Resp 19   LMP 06/23/2014   SpO2 98%  Physical Exam Vitals and nursing note reviewed.  Constitutional:      Appearance: Normal appearance.  HENT:     Head: Normocephalic and atraumatic.     Right Ear: External ear normal.     Left Ear: External ear normal.     Nose: Nose normal.     Mouth/Throat:     Mouth: Mucous membranes are dry.  Eyes:     Extraocular Movements: Extraocular movements intact.     Conjunctiva/sclera: Conjunctivae normal.     Pupils: Pupils are equal, round, and reactive to light.  Cardiovascular:     Rate and Rhythm: Regular rhythm. Tachycardia present.     Pulses: Normal pulses.     Heart sounds: Normal heart sounds.  Pulmonary:     Effort: Pulmonary effort is normal.     Breath sounds: Normal breath sounds.  Abdominal:     General: Abdomen is flat. Bowel sounds are normal.     Palpations: Abdomen is soft.  Musculoskeletal:        General: Normal range of motion.     Cervical back: Normal range of motion and neck supple.  Skin:    General: Skin is warm.     Capillary Refill: Capillary refill takes 2 to 3 seconds.  Neurological:     General: No focal deficit present.     Mental Status: [name] is alert and oriented to person, place, and time.  Psychiatric:        Mood and Affect: Mood normal.        Behavior: Behavior normal.     ED Results / Procedures / Treatments   Labs (all labs ordered are listed, but only abnormal results are displayed) Labs Reviewed  RESP PANEL BY RT-PCR (FLU A&B, COVID) [id number]  CULTURE, BLOOD (ROUTINE X 2)  CULTURE, BLOOD (ROUTINE X 2)  URINE CULTURE  LACTIC  ACID, PLASMA  LACTIC ACID, PLASMA  COMPREHENSIVE METABOLIC PANEL  CBC WITH DIFFERENTIAL/PLATELET  PROTIME-INR  APTT  URINALYSIS, ROUTINE W REFLEX MICROSCOPIC  I-STAT BETA HCG BLOOD, ED ([facility], [facility], [facility] ONLY)    EKG None  Radiology No results found.  Procedures Procedures  {Document cardiac monitor, telemetry assessment procedure when appropriate:1}  Medications Ordered in ED Medications  lactated  ringers infusion (has no administration in time range)  lactated ringers bolus 3,000 mL (has no administration in time range)  metroNIDAZOLE (FLAGYL) IVPB 500 mg (has no administration in time range)  acetaminophen (TYLENOL) tablet 1,000 mg (has no administration in time range)  ceFEPIme (MAXIPIME) 2 g in sodium chloride 0.9 % 100 mL IVPB (has no administration in time range)  vancomycin (VANCOCIN) 2,250 mg in sodium chloride 0.9 % 500 mL IVPB (has no administration in time range)    ED Course/ Medical Decision Making/ A&P                           Medical Decision Making Amount and/or Complexity of Data Reviewed Labs: ordered. Radiology: ordered. ECG/medicine tests: ordered.  Risk OTC drugs. Prescription drug management.   This patient presents to the ED for concern of sepsis, this involves an extensive number of treatment options, and is a complaint that carries with it a high risk of complications and morbidity.  The differential diagnosis includes sepsis, infection   Co morbidities that complicate the patient evaluation  DM, HTN, Asthma, GERD, Osteomyelitis, carcinosarcoma of the endometrium   Additional history obtained:  Additional history obtained from epic chart review External records from outside source obtained and reviewed including EMS report   Lab Tests:  I Ordered, and personally interpreted labs.  The pertinent results include:  ***   Imaging Studies ordered:  I ordered imaging studies including ***  I independently visualized and interpreted  imaging which showed *** I agree with the radiologist interpretation   Cardiac Monitoring:  The patient was maintained on a cardiac monitor.  I personally viewed and interpreted the cardiac monitored which showed an underlying rhythm of: ***   Medicines ordered and prescription drug management:  I ordered medication including ***  for ***  Reevaluation of the patient after these medicines showed that the patient {resolved/improved/worsened:23923::"improved"} I have reviewed the patients home medicines and have made adjustments as needed   Test Considered:  ***   Critical Interventions:  ***   Consultations Obtained:  I requested consultation with the ***,  and discussed lab and imaging findings as well as pertinent plan - they recommend: ***   Problem List / ED Course:  ***   Reevaluation:  After the interventions noted above, I reevaluated the patient and found that they have :{resolved/improved/worsened:23923::"improved"}   Social Determinants of Health:  ***   Dispostion:  After consideration of the diagnostic results and the patients response to treatment, I feel that the patent would benefit from ***.    {Document critical care time when appropriate:1} {Document review of labs and clinical decision tools ie heart score, Chads2Vasc2 etc:1}  {Document your independent review of radiology images, and any outside records:1} {Document your discussion with family members, caretakers, and with consultants:1} {Document social determinants of health affecting pt's care:1} {Document your decision making why or why not admission, treatments were needed:1} Final Clinical Impression(s) / ED Diagnoses Final diagnoses:  None    Rx / DC Orders ED Discharge Orders     None

## 2022-06-22 NOTE — ED Triage Notes (Signed)
Pt BIB [facility] from home after syncopal episode at dinner witnessed by family. Patient had LOC for about a minute and has been feeling weak for a week. Initial pressure 60 palpated, improved to 96/69. A&Ox4, GCS 15

## 2022-06-22 NOTE — Progress Notes (Signed)
Sepsis tracking by eLINK 

## 2022-06-23 ENCOUNTER — Inpatient Hospital Stay (HOSPITAL_COMMUNITY): Payer: Medicaid Other

## 2022-06-23 ENCOUNTER — Other Ambulatory Visit: Payer: Self-pay

## 2022-06-23 DIAGNOSIS — R6521 Severe sepsis with septic shock: Secondary | ICD-10-CM | POA: Diagnosis not present

## 2022-06-23 DIAGNOSIS — C541 Malignant neoplasm of endometrium: Secondary | ICD-10-CM | POA: Diagnosis present

## 2022-06-23 DIAGNOSIS — Z20822 Contact with and (suspected) exposure to covid-19: Secondary | ICD-10-CM | POA: Diagnosis present

## 2022-06-23 DIAGNOSIS — E785 Hyperlipidemia, unspecified: Secondary | ICD-10-CM | POA: Diagnosis present

## 2022-06-23 DIAGNOSIS — J45909 Unspecified asthma, uncomplicated: Secondary | ICD-10-CM | POA: Diagnosis present

## 2022-06-23 DIAGNOSIS — Z7985 Long-term (current) use of injectable non-insulin antidiabetic drugs: Secondary | ICD-10-CM | POA: Diagnosis not present

## 2022-06-23 DIAGNOSIS — E1165 Type 2 diabetes mellitus with hyperglycemia: Secondary | ICD-10-CM | POA: Diagnosis not present

## 2022-06-23 DIAGNOSIS — Z833 Family history of diabetes mellitus: Secondary | ICD-10-CM | POA: Diagnosis not present

## 2022-06-23 DIAGNOSIS — D849 Immunodeficiency, unspecified: Secondary | ICD-10-CM | POA: Diagnosis present

## 2022-06-23 DIAGNOSIS — Z794 Long term (current) use of insulin: Secondary | ICD-10-CM | POA: Diagnosis not present

## 2022-06-23 DIAGNOSIS — E861 Hypovolemia: Secondary | ICD-10-CM | POA: Diagnosis present

## 2022-06-23 DIAGNOSIS — Z880 Allergy status to penicillin: Secondary | ICD-10-CM | POA: Diagnosis not present

## 2022-06-23 DIAGNOSIS — Z886 Allergy status to analgesic agent status: Secondary | ICD-10-CM | POA: Diagnosis not present

## 2022-06-23 DIAGNOSIS — D638 Anemia in other chronic diseases classified elsewhere: Secondary | ICD-10-CM | POA: Diagnosis present

## 2022-06-23 DIAGNOSIS — A419 Sepsis, unspecified organism: Secondary | ICD-10-CM | POA: Diagnosis present

## 2022-06-23 DIAGNOSIS — R55 Syncope and collapse: Secondary | ICD-10-CM | POA: Diagnosis present

## 2022-06-23 DIAGNOSIS — K219 Gastro-esophageal reflux disease without esophagitis: Secondary | ICD-10-CM | POA: Diagnosis present

## 2022-06-23 DIAGNOSIS — I10 Essential (primary) hypertension: Secondary | ICD-10-CM | POA: Diagnosis present

## 2022-06-23 DIAGNOSIS — Z79899 Other long term (current) drug therapy: Secondary | ICD-10-CM | POA: Diagnosis not present

## 2022-06-23 DIAGNOSIS — N179 Acute kidney failure, unspecified: Secondary | ICD-10-CM | POA: Diagnosis present

## 2022-06-23 DIAGNOSIS — I959 Hypotension, unspecified: Secondary | ICD-10-CM | POA: Diagnosis not present

## 2022-06-23 DIAGNOSIS — D6489 Other specified anemias: Secondary | ICD-10-CM | POA: Diagnosis not present

## 2022-06-23 DIAGNOSIS — E1152 Type 2 diabetes mellitus with diabetic peripheral angiopathy with gangrene: Secondary | ICD-10-CM | POA: Diagnosis present

## 2022-06-23 DIAGNOSIS — Z8542 Personal history of malignant neoplasm of other parts of uterus: Secondary | ICD-10-CM | POA: Diagnosis not present

## 2022-06-23 DIAGNOSIS — Z7984 Long term (current) use of oral hypoglycemic drugs: Secondary | ICD-10-CM | POA: Diagnosis not present

## 2022-06-23 DIAGNOSIS — E1169 Type 2 diabetes mellitus with other specified complication: Secondary | ICD-10-CM | POA: Diagnosis not present

## 2022-06-23 LAB — BASIC METABOLIC PANEL
Anion gap: 9 (ref 5–15)
BUN: 19 mg/dL (ref 6–20)
CO2: 23 mmol/L (ref 22–32)
Calcium: 9.2 mg/dL (ref 8.9–10.3)
Chloride: 105 mmol/L (ref 98–111)
Creatinine, Ser: 1.79 mg/dL — ABNORMAL HIGH (ref 0.44–1.00)
GFR, Estimated: 32 mL/min — ABNORMAL LOW (ref >60)
Glucose, Bld: 142 mg/dL — ABNORMAL HIGH (ref 70–99)
Potassium: 4 mmol/L (ref 3.5–5.1)
Sodium: 137 mmol/L (ref 135–145)

## 2022-06-23 LAB — I-STAT ARTERIAL BLOOD GAS, ED
Acid-base deficit: 2 mmol/L (ref 0.0–2.0)
Bicarbonate: 22.9 mmol/L (ref 20.0–28.0)
Calcium, Ion: 1.27 mmol/L (ref 1.15–1.40)
HCT: 23 % — ABNORMAL LOW (ref 36.0–46.0)
Hemoglobin: 7.8 g/dL — ABNORMAL LOW (ref 12.0–15.0)
O2 Saturation: 97 %
Patient temperature: 97.8
Potassium: 4.3 mmol/L (ref 3.5–5.1)
Sodium: 134 mmol/L — ABNORMAL LOW (ref 135–145)
TCO2: 24 mmol/L (ref 22–32)
pCO2 arterial: 38.6 mmHg (ref 32–48)
pH, Arterial: 7.379 (ref 7.35–7.45)
pO2, Arterial: 87 mmHg (ref 83–108)

## 2022-06-23 LAB — MRSA NEXT GEN BY PCR, NASAL: MRSA by PCR Next Gen: NOT DETECTED

## 2022-06-23 LAB — GLUCOSE, CAPILLARY
Glucose-Capillary: 113 mg/dL — ABNORMAL HIGH (ref 70–99)
Glucose-Capillary: 129 mg/dL — ABNORMAL HIGH (ref 70–99)
Glucose-Capillary: 133 mg/dL — ABNORMAL HIGH (ref 70–99)
Glucose-Capillary: 150 mg/dL — ABNORMAL HIGH (ref 70–99)
Glucose-Capillary: 226 mg/dL — ABNORMAL HIGH (ref 70–99)
Glucose-Capillary: 139 mg/dL — ABNORMAL HIGH (ref 70–99)

## 2022-06-23 LAB — PROCALCITONIN: Procalcitonin: 1.04 ng/mL

## 2022-06-23 LAB — LACTIC ACID, PLASMA
Lactic Acid, Venous: 1.4 mmol/L (ref 0.5–1.9)
Lactic Acid, Venous: 1.3 mmol/L (ref 0.5–1.9)

## 2022-06-23 LAB — CBC
HCT: 27.2 % — ABNORMAL LOW (ref 36.0–46.0)
Hemoglobin: 9.1 g/dL — ABNORMAL LOW (ref 12.0–15.0)
MCH: 31.1 pg (ref 26.0–34.0)
MCHC: 33.5 g/dL (ref 30.0–36.0)
MCV: 92.8 fL (ref 80.0–100.0)
Platelets: 176 10*3/uL (ref 150–400)
RBC: 2.93 MIL/uL — ABNORMAL LOW (ref 3.87–5.11)
RDW: 14.6 % (ref 11.5–15.5)
WBC: 5.1 10*3/uL (ref 4.0–10.5)
nRBC: 0 % (ref 0.0–0.2)

## 2022-06-23 LAB — ECHOCARDIOGRAM COMPLETE
AR max vel: 2.35 cm2
AV Area VTI: 2.39 cm2
AV Area mean vel: 2.33 cm2
AV Mean grad: 7 mmHg
AV Peak grad: 12.7 mmHg
Ao pk vel: 1.78 m/s
Area-P 1/2: 4.17 cm2
Height: 73 in
S' Lateral: 2.8 cm
Weight: 4345.71 oz

## 2022-06-23 LAB — RESP PANEL BY RT-PCR (FLU A&B, COVID) ARPGX2
Influenza A by PCR: NEGATIVE
Influenza B by PCR: NEGATIVE
SARS Coronavirus 2 by RT PCR: NEGATIVE

## 2022-06-23 LAB — HEMOGLOBIN A1C
Hgb A1c MFr Bld: 5.7 % — ABNORMAL HIGH (ref 4.8–5.6)
Mean Plasma Glucose: 116.89 mg/dL

## 2022-06-23 LAB — I-STAT BETA HCG BLOOD, ED (MC, WL, AP ONLY): I-stat hCG, quantitative: <5 m[IU]/mL (ref <5)

## 2022-06-23 LAB — CBG MONITORING, ED: Glucose-Capillary: 119 mg/dL — ABNORMAL HIGH (ref 70–99)

## 2022-06-23 LAB — CORTISOL: Cortisol, Plasma: 20.2 ug/dL

## 2022-06-23 MED ORDER — LACTATED RINGERS IV BOLUS
1000.0000 mL | Freq: Once | INTRAVENOUS | Status: AC
  Administered 2022-06-23: 1000 mL via INTRAVENOUS

## 2022-06-23 MED ORDER — ONDANSETRON HCL 4 MG PO TABS
8.0000 mg | ORAL_TABLET | Freq: Two times a day (BID) | ORAL | Status: DC | PRN
Start: 2022-06-23 — End: 2022-06-25

## 2022-06-23 MED ORDER — ACETAMINOPHEN 325 MG PO TABS
650.0000 mg | ORAL_TABLET | Freq: Four times a day (QID) | ORAL | Status: DC | PRN
  Administered 2022-06-24 – 2022-06-25 (×3): 650 mg via ORAL
  Filled 2022-06-23 (×2): qty 2

## 2022-06-23 MED ORDER — NOREPINEPHRINE 4 MG/250ML-% IV SOLN
2.0000 ug/min | INTRAVENOUS | Status: DC
  Administered 2022-06-23: 2 ug/min via INTRAVENOUS
  Filled 2022-06-23: qty 250

## 2022-06-23 MED ORDER — PANTOPRAZOLE SODIUM 40 MG PO TBEC
40.0000 mg | DELAYED_RELEASE_TABLET | Freq: Every day | ORAL | Status: DC
  Administered 2022-06-23 – 2022-06-25 (×3): 40 mg via ORAL
  Filled 2022-06-23 (×3): qty 1

## 2022-06-23 MED ORDER — SODIUM CHLORIDE 0.9 % IV SOLN
2.0000 g | Freq: Two times a day (BID) | INTRAVENOUS | Status: DC
  Administered 2022-06-23: 2 g via INTRAVENOUS
  Filled 2022-06-23 (×2): qty 12.5

## 2022-06-23 MED ORDER — INSULIN GLARGINE-YFGN 100 UNIT/ML ~~LOC~~ SOLN
40.0000 [IU] | Freq: Every day | SUBCUTANEOUS | Status: DC
Start: 2022-06-23 — End: 2022-06-23
  Filled 2022-06-23: qty 0.4

## 2022-06-23 MED ORDER — INSULIN ASPART 100 UNIT/ML IJ SOLN
0.0000 [IU] | Freq: Every day | INTRAMUSCULAR | Status: DC
  Administered 2022-06-24: 2 [IU] via SUBCUTANEOUS

## 2022-06-23 MED ORDER — INSULIN ASPART 100 UNIT/ML IJ SOLN
0.0000 [IU] | Freq: Three times a day (TID) | INTRAMUSCULAR | Status: DC
  Administered 2022-06-23 (×3): 3 [IU] via SUBCUTANEOUS
  Administered 2022-06-24: 7 [IU] via SUBCUTANEOUS
  Administered 2022-06-24: 4 [IU] via SUBCUTANEOUS
  Administered 2022-06-24: 7 [IU] via SUBCUTANEOUS
  Administered 2022-06-25: 4 [IU] via SUBCUTANEOUS
  Administered 2022-06-25: 7 [IU] via SUBCUTANEOUS

## 2022-06-23 MED ORDER — LACTATED RINGERS IV BOLUS: 1000.0000 mL | Freq: Once | INTRAVENOUS | Status: DC

## 2022-06-23 MED ORDER — SODIUM CHLORIDE 0.9 % IV SOLN: 250.0000 mL | INTRAVENOUS | Status: DC

## 2022-06-23 MED ORDER — TRAZODONE HCL 50 MG PO TABS
50.0000 mg | ORAL_TABLET | Freq: Every evening | ORAL | Status: DC | PRN
  Filled 2022-06-23: qty 1

## 2022-06-23 MED ORDER — HEPARIN SODIUM (PORCINE) 5000 UNIT/ML IJ SOLN
5000.0000 [IU] | Freq: Three times a day (TID) | INTRAMUSCULAR | Status: DC
  Administered 2022-06-23 – 2022-06-24 (×7): 5000 [IU] via SUBCUTANEOUS
  Filled 2022-06-23 (×7): qty 1

## 2022-06-23 MED ORDER — PROCHLORPERAZINE MALEATE 10 MG PO TABS: 10.0000 mg | ORAL_TABLET | Freq: Four times a day (QID) | ORAL | Status: DC | PRN

## 2022-06-23 MED ORDER — ATORVASTATIN CALCIUM 80 MG PO TABS
80.0000 mg | ORAL_TABLET | Freq: Every day | ORAL | Status: DC
  Administered 2022-06-23 – 2022-06-25 (×3): 80 mg via ORAL
  Filled 2022-06-23 (×3): qty 1

## 2022-06-23 NOTE — Hospital Course (Addendum)
Endometrial cancer Last chemo July last rad 4th Feeling off balance, swaying while walking Not aware she had fever Passed out at dinner Bp was low Improved with fluids Temp improved with tylenol  AKI  Unclear source at ths time  Vanc, max, flagyl Has indwelling port  Sncope - infection related Orthostatics Ekg  61 year old female hx of endome cancer Receiving chemo and radiation presented with syncope Received some IVFs from ems Noted to have maps in 64s and code sepsis activated in ED,

## 2022-06-23 NOTE — Consult Note (Addendum)
NAME:  [name], MRN:  [id number], DOB:  1961-08-27, LOS: 0 ADMISSION DATE:  06/22/2022, CONSULTATION DATE:  06/23/22 REFERRING MD:  Evette Doffing CHIEF COMPLAINT:  Weakness   History of Present Illness:  [name] is a 61 y.o. 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 She presented to [facility] ED 7/26 after she had syncopal episode at a restaurant earlier that evening.  She has apparently been feeling poorly for several days now, mostly generalized weakness and fatigue. She attributed it to her chemo. She has had decreased PO intake and per family, "hasn't been right" for several days. Daughter is at bedside but does not live with pt; therefore, unable to give me full details on her hx. Pt quite sleepy so not fully interactive during exam. She is awake and alert and does answer questions appropriately when prompted several times. ABG normal.  In ED, she was noted to be hypotensive despite 3L LR bolus. She was also febrile to 100.7. She was initially admitted by IMTS; however, due to borderline BP, PCCM called for transfer to ICU. Upon my exam, she still appears volume down and I have ordered an additional liter LR.   Pertinent  Medical History:  has Diabetes mellitus ([place]); Neuropathy in diabetes ([place]); Hypertension; ASTHMA; Health care maintenance; GERD (gastroesophageal reflux disease); Severe obesity (BMI 35.0-39.9); Hyperlipidemia associated with type 2 diabetes mellitus ([place]); Morton neuroma; Broken teeth; Diabetic ulcer of toe of left foot associated with diabetes mellitus due to underlying condition ([place]); Opioid dependence ([place]); Long term current use of opiate analgesic; Uncontrolled diabetes mellitus with hyperglycemia ([place]); Hand pain, right; Strain of lumbar paraspinal muscle, initial encounter; Chest pain; Colon cancer screening; Encounter for  screening mammogram for malignant neoplasm of breast; Neck pain; Shoulder pain; Thoracic outlet syndrome; Ischemic toe; Gangrene of toe of right foot ([place]); Dizziness; Visit for routine gyn exam; PMB (postmenopausal bleeding); 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([place]); Obesity, Class II, BMI 35-39.9; Low back pain; Lumbar radiculopathy; 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[place]); Insomnia; Anemia due to antineoplastic chemotherapy; Syncope; and Septic shock ([place]) on their problem list.   Significant Hospital Events: Including procedures, antibiotic start and stop dates in addition to other pertinent events   7/27 admit.  Interim History / Subjective:  Sleepy but awakens to voice. Says she has felt bad for "several days", but now "much better". Daughter and grandson at bedside.  Objective:  Blood pressure (!) 103/46, pulse 96, temperature (!) 100.7 F (38.2 C), temperature source Oral, resp. rate 15, last menstrual period 06/23/2014, SpO2 97 %.       No intake or output data in the 24 hours ending 06/23/22 0205 There were no vitals filed for this visit.  Examination: General: Adult female, resting in bed, in NAD. Neuro: Sleepy but A&O x 3 after some stimulation. HEENT: Shorter/AT. Sclerae anicteric. MM very dry. Cardiovascular: RRR, no M/R/G.  Lungs: Respirations even and unlabored.  CTA bilaterally, No W/R/R.  Abdomen: BS x 4, soft, NT/ND.  Musculoskeletal: No gross deformities, no edema.  Skin: Intact, warm, no rashes.   Assessment & Plan:   Shock - concern for septic given fever to 100.7 but also likely some component of hypovolemia given her exam. Lactate reassuring. - Continue fluids, extra liter LR ordered for total of 5 liters. - Continue MIVF. - Empiric vanc/cefepime for now. - Follow cultures. - Check  PCT, cortisol (on Decadron prior to chemo treatments). - Low dose levophed as needed for goal MAP > 65. - Assess echo.  Syncope - presumed 2/2 hypotension. No focal deficits on  exam. - Supportive care.  AKI - presumed pre-renal from hypovolemia but could be in setting sepsis. Also exacerbated by ACEi. - Continue fluids. - Renal US if no improvement by AM. - Hold home Lisinopril. - Follow BMP.  Anemia of chronic disease. - Transfuse for Hgb < 7.  Hx DM. - Continue SSI. - Hold home Metformin, Semaglutide.  Hx HTN. - Hold home Lisinopril.  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 - F/u as outpatient.  Best practice (evaluated daily):  Diet/type: Regular consistency (see orders) DVT prophylaxis: prophylactic heparin  GI prophylaxis: N/A Lines: N/A Foley:  N/A Code Status:  full code Last date of multidisciplinary goals of care discussion: None yet.  Labs   CBC: Recent Labs  Lab 06/22/22 2308  WBC 4.3  NEUTROABS 2.5  HGB 9.2*  HCT 28.1*  MCV 94.9  PLT 676    Basic Metabolic Panel: Recent Labs  Lab 06/22/22 2308  NA 131*  K 4.5  CL 99  CO2 20*  GLUCOSE 144*  BUN 24*  CREATININE 3.28*  CALCIUM 9.2   GFR: Estimated Creatinine Clearance: 27.2 mL/min (A) (by C-G formula based on SCr of 3.28 mg/dL (H)). Recent Labs  Lab 06/22/22 2308  WBC 4.3  LATICACIDVEN 1.4    Liver Function Tests: Recent Labs  Lab 06/22/22 2308  AST 17  ALT 16  ALKPHOS 82  BILITOT 0.6  PROT 6.9  ALBUMIN 3.3*   No results for input(s): "LIPASE", "AMYLASE" in the last 168 hours. No results for input(s): "AMMONIA" in the last 168 hours.  ABG    Component Value Date/Time   HCO3 22.7 06/29/2021 1937   TCO2 29 09/16/2015 1139   ACIDBASEDEF 2.5 (H) 06/29/2021 1937   O2SAT 52.6 06/29/2021 1937     Coagulation Profile: Recent Labs  Lab 06/22/22 2308  INR 1.1    Cardiac Enzymes: No results for input(s): "CKTOTAL", "CKMB", "CKMBINDEX", "TROPONINI" in the last 168 hours.  HbA1C: Hemoglobin A1C  Date/Time Value Ref Range Status  03/16/2022 11:16 AM 7.8 (A)  4.0 - 5.6 % Final  12/27/2021 10:09 AM 10.2 (A) 4.0 - 5.6 % Final   Hgb A1c MFr Bld  Date/Time Value Ref Range Status  06/30/2021 10:45 AM 9.6 (H) 4.8 - 5.6 % Final    Comment:    (NOTE) Pre diabetes:          5.7%-6.4%  Diabetes:              >6.4%  Glycemic control for   <7.0% adults with diabetes   02/24/2020 06:44 PM 10.1 (H) 4.8 - 5.6 % Final    Comment:    (NOTE) Pre diabetes:          5.7%-6.4% Diabetes:              >6.4% Glycemic control for   <7.0% adults with diabetes     CBG: No results for input(s): "GLUCAP" in the last 168 hours.  Review of Systems:   All negative; except for those that are bolded, which indicate positives.  Constitutional: weight loss, weight gain, night sweats, fevers, chills, fatigue, weakness.  HEENT: headaches, sore throat, sneezing, nasal congestion, post nasal drip, difficulty swallowing, tooth/dental problems, visual complaints, visual changes,  ear aches. Neuro: difficulty with speech, weakness, numbness, ataxia. CV:  chest pain, orthopnea, PND, swelling in lower extremities, dizziness, palpitations, syncope.  Resp: cough, hemoptysis, dyspnea, wheezing. GI: heartburn, indigestion, abdominal pain, nausea, vomiting, diarrhea, constipation, change in bowel habits, loss of appetite, hematemesis, melena, hematochezia.  GU: dysuria, change in color of urine, urgency or frequency, flank pain, hematuria. MSK: joint pain or swelling, decreased range of motion. Psych: change in mood or affect, depression, anxiety, suicidal ideations, homicidal ideations. Skin: rash, itching, bruising. '  Past Medical History:  She,  has a past medical history of Anemia, Asthma (11/01/2010), BPPV (benign paroxysmal positional vertigo) (03/07/2013), Diabetes mellitus, Elevated TSH (05/13/2013), Endometrial cancer ([place]), GERD (gastroesophageal reflux disease), Hypertension, Joint pain (05/26/2022), Left elbow pain (05/05/2016), Left shoulder pain (05/05/2016),  Osteomyelitis ([place]), Pneumonia, and Wrist pain (04/11/2013).   Surgical History:   Past Surgical History:  Procedure Laterality Date   ABDOMINAL HYSTERECTOMY     ACHILLES TENDON LENGTHENING Left 08/29/2014   Procedure: Left Achilles Lengthening;  Surgeon: Newt Minion, MD;  Location: [facility];  Service: Orthopedics;  Laterality: Left;   AMPUTATION Left 02/19/2013   Procedure: Amputation of Left Great Toe;  Surgeon: Mcarthur Rossetti, MD;  Location: [facility];  Service: Orthopedics;  Laterality: Left;   AMPUTATION Left 08/29/2014   Procedure: Left Foot 2nd and 1st Toe Amputation;  Surgeon: Newt Minion, MD;  Location: [facility];  Service: Orthopedics;  Laterality: Left;   CARDIAC CATHETERIZATION     15 years ago   CESAREAN SECTION     x 4   CYSTOSCOPY  03/30/2022   Procedure: CYSTOSCOPY;  Surgeon: [name], MD;  Location: [facility];  Service: Gynecology;;   IR IMAGING GUIDED PORT INSERTION  04/19/2022   ROBOTIC ASSISTED TOTAL HYSTERECTOMY WITH BILATERAL SALPINGO OOPHERECTOMY Bilateral 03/30/2022   Procedure: XI ROBOTIC ASSISTED TOTAL HYSTERECTOMY WITH BILATERAL SALPINGO-OOPHORECTOMY, MINI LAPAROTOMY;  Surgeon: [name], MD;  Location: [facility];  Service: Gynecology;  Laterality: Bilateral;   SENTINEL NODE BIOPSY N/A 03/30/2022   Procedure: SENTINEL NODE BIOPSY;  Surgeon: [name], MD;  Location: [facility];  Service: Gynecology;  Laterality: N/A;   TUBAL LIGATION     at one of c-sections     Social History:   reports that she quit smoking about 59 years ago. Her smoking use included cigarettes. She has never used smokeless tobacco. She reports that she does not drink alcohol and does not use drugs.   Family History:  Her family history includes Dementia in her mother; Diabetes in her daughter and father; Hypertension in her father and mother. There is no history of Colon cancer, Breast cancer, Ovarian cancer, Endometrial cancer, Pancreatic cancer, or Prostate cancer.    Allergies Allergies  Allergen Reactions   Nsaids Nausea And Vomiting    Acid reflux   Penicillins Other (See Comments)    Unknown Did it involve swelling of the face/tongue/throat, SOB, or low BP? Unk Did it involve sudden or severe rash/hives, skin peeling, or any reaction on the inside of your mouth or nose? Unk  Did you need to seek medical attention at a hospital or doctor's office? Unk When did it last happen? Childhood      If all above answers are "NO", may proceed with cephalosporin use.      Home Medications  Prior to Admission medications   Medication Sig Start Date End Date Taking? Authorizing Provider  ACCU-CHEK GUIDE test strip USE UP TO FOUR TIMES DAILY  AS DIRECTED 12/01/21   Masters, [name], DO  acetaminophen (TYLENOL) 650 MG CR tablet Take 1,300-1,950 mg by mouth every 8 (eight) hours as needed for pain.    [provider]  atorvastatin (LIPITOR) 80 MG tablet Take 1 tablet (80 mg total) by mouth daily. 09/23/21   Lacinda Axon, MD  blood glucose meter kit and supplies Dispense based on patient and insurance preference. Use up to four times daily as directed. (FOR ICD-10 E10.9, E11.9). 09/28/21   Masters, [name], DO  Continuous Blood Gluc Transmit (DEXCOM G6 TRANSMITTER) MISC Check blood sugar at least 8 times a day 09/22/21   Masters, [name], DO  dexamethasone (DECADRON) 4 MG tablet Take 2 tabs at the night before chemotherapy, every 3 weeks, by mouth x 6 cycles 04/22/22   Heath Lark, MD  gabapentin (NEURONTIN) 300 MG capsule Take 3 capsules (900 mg total) by mouth 3 (three) times daily. 08/10/21   Sanjuan Dame, MD  insulin glargine (LANTUS SOLOSTAR) 100 UNIT/ML Solostar Pen Inject 80 Units into the skin at bedtime. 03/23/22 06/21/22  Lottie Mussel, MD  insulin lispro (HUMALOG) 100 UNIT/ML injection Inject 16-18 units with breakfast and lunch and 14 units with dinner. 05/18/22 05/18/23  Idamae Schuller, MD  Insulin Pen Needle 31G X 5 MM MISC Use as directed with  Lantus and Humalog 09/07/21   Masters, [name], DO  Lancets [facility] DELICA PLUS [id number]) MISC Check blood sugar 3 times a day 09/22/21   Masters, [name], DO  lidocaine-prilocaine (EMLA) cream Apply to affected area once 04/08/22   Heath Lark, MD  lisinopril (ZESTRIL) 20 MG tablet Take 1 tablet (20 mg total) by mouth daily. 12/27/21 12/27/22  [name], MD  meclizine (ANTIVERT) 25 MG tablet Take 1 tablet (25 mg total) by mouth 3 (three) times daily as needed for dizziness. 03/01/22   Masters, Katie, DO  metFORMIN (GLUCOPHAGE-XR) 500 MG 24 hr tablet Take 4 tablets (2,000 mg total) by mouth at bedtime. 04/20/22 04/20/23  Lottie Mussel, MD  Multiple Vitamin (MULTIVITAMIN WITH MINERALS) TABS tablet Take 1 tablet by mouth every evening.     [provider]  omeprazole (PRILOSEC) 20 MG capsule Take 1 capsule (20 mg total) by mouth daily. Patient taking differently: Take 20 mg by mouth daily as needed (acid reflux). 05/04/20   Katherine Roan, MD  ondansetron (ZOFRAN) 8 MG tablet Take 1 tablet (8 mg total) by mouth 2 (two) times daily as needed. Start on the third day after chemotherapy. 04/08/22   Heath Lark, MD  prochlorperazine (COMPAZINE) 10 MG tablet Take 1 tablet (10 mg total) by mouth every 6 (six) hours as needed (Nausea or vomiting). 04/08/22   Heath Lark, MD  Semaglutide, 2 MG/DOSE, 8 MG/3ML SOPN Inject 2 mg into the skin once a week. 03/16/22   Lottie Mussel, MD  traMADol (ULTRAM) 50 MG tablet Take 2 tablets (100 mg total) by mouth every 6 (six) hours as needed for severe pain. 05/26/22   Heath Lark, MD  traZODone (DESYREL) 50 MG tablet Take 1 tablet (50 mg total) by mouth at bedtime as needed for sleep. 05/02/22   Masters, Katie, DO     Critical care time: 40 min.   Montey Hora, [facility] For pager details, please see AMION or use Epic chat  After 1900, please call [place] for cross coverage needs 06/23/2022, 2:05 AM

## 2022-06-23 NOTE — Progress Notes (Signed)
Report called to Percell Boston, RN. Transferred to 13M 21.

## 2022-06-23 NOTE — Progress Notes (Signed)
Pharmacy Antibiotic Note  [name] is a 61 y.o. female for which pharmacy has been consulted for cefepime and vancomycin dosing for sepsis.  Patient with a history of DM, HTN, Asthma, GERD, Osteomyelitis, carcinosarcoma of the endometrium. Patient presenting with LOC.  SCr 3.28 >>1.79  WBC pending; LA pending; T 100.4 F  Plan: Adjust Cefepime to 2 g IV every 12 hours. Adjust Vancomycin to 1000 mg IV every 24 hours.  F/up MRSA PCR and if negative plan is to stop Vancomycin per discussion with Dr. Vaughan Browner.  Trend WBC, Fever, Renal function, & Clinical course F/u cultures, clinical course, WBC, fever De-escalate when able Levels at steady state  Height: '6\' 1"'$  (185.4 cm) Weight: 123.2 kg (271 lb 9.7 oz) IBW/kg (Calculated) : 75.4  Temp (24hrs), Avg:99.1 F (37.3 C), Min:97.8 F (36.6 C), Max:100.7 F (38.2 C)  Recent Labs  Lab 06/22/22 2308 06/23/22 0208 06/23/22 0732  WBC 4.3  --  5.1  CREATININE 3.28*  --  1.79*  LATICACIDVEN 1.4 1.3  --     Estimated Creatinine Clearance: 49.9 mL/min (A) (by C-G formula based on SCr of 1.79 mg/dL (H)).    Allergies  Allergen Reactions   Nsaids Nausea And Vomiting    Acid reflux   Penicillins Other (See Comments)    Unknown Did it involve swelling of the face/tongue/throat, SOB, or low BP? Unk Did it involve sudden or severe rash/hives, skin peeling, or any reaction on the inside of your mouth or nose? Unk  Did you need to seek medical attention at a hospital or doctor's office? Unk When did it last happen? Childhood      If all above answers are "NO", may proceed with cephalosporin use.     Antimicrobials this admission: vancomycin 7/26 >>  cefepime 7/26 >>  metronidazole 7/26 x1 dose  Microbiology results: 7/26 MRSA PCR - pending 7/26 Blood culture >>  7/26 Urine culture (not yet sent)  Thank you for allowing pharmacy to be a part of this patient's care.  Sloan Leiter, PharmD, BCPS, BCCCP Clinical  Pharmacist Please refer to [facility] for [place] numbers 06/23/2022 9:01 AM

## 2022-06-23 NOTE — ED Notes (Signed)
Admitting notified of pt's blood pressure.

## 2022-06-23 NOTE — Progress Notes (Signed)
  Echocardiogram 2D Echocardiogram has been performed.  [name] 06/23/2022, 3:42 PM

## 2022-06-23 NOTE — H&P (Addendum)
Date: 06/23/2022               Patient Name:  [name] MRN: [id number]  DOB: 05/21/1961 Age / Sex: 60 y.o., female   PCP: Christiana Fuchs, DO         Medical Service: Internal Medicine Teaching Service         Attending Physician: Dr. 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  Patient was eating dinner with her family at [place] when she had syncopal event. She says that the even happened suddenly. No symptoms preceding event. No reported seizure like activity during syncope. Event was witnessed by off-duty first responder who noted that her pulse was rapid. She had been feeling poor for several days with associated weakness, fatigue, malaise. She endorsed poor PO intake with nausea and 1X episode vomiting yesterday. Has been having 2X loose stools most days since starting chemo. She denies cough, SOB, dysuria, or pain. No headache.   ED Course: Code sepsis activated. Patient received 3L IVFs with minimal improvement in blood pressure.   Meds:  No outpatient medications have been marked as taking for the 06/22/22 encounter [facility] Encounter).    Past Medical History:  Diagnosis Date   Anemia    Asthma 11/01/2010   only when respiratory infections   BPPV (benign paroxysmal positional vertigo) 03/07/2013   Diabetes mellitus    since 1983   Elevated TSH 05/13/2013   Endometrial cancer ([place])    GERD (gastroesophageal reflux disease)    Hypertension    Joint pain 05/26/2022   Left elbow pain 05/05/2016   Left shoulder pain 05/05/2016   Osteomyelitis ([place])    Pneumonia    as a child   Wrist  pain 04/11/2013    Past Surgical History:  Procedure Laterality Date   ABDOMINAL HYSTERECTOMY     ACHILLES TENDON LENGTHENING Left 08/29/2014   Procedure: Left Achilles Lengthening;  Surgeon: Newt Minion, MD;  Location: [place];  Service: Orthopedics;  Laterality: Left;   AMPUTATION Left 02/19/2013   Procedure: Amputation of Left Great Toe;  Surgeon: Mcarthur Rossetti, MD;  Location: [place];  Service: Orthopedics;  Laterality: Left;   AMPUTATION Left 08/29/2014   Procedure: Left Foot 2nd and 1st Toe Amputation;  Surgeon: Newt Minion, MD;  Location: [place];  Service: Orthopedics;  Laterality: Left;   CARDIAC CATHETERIZATION     15 years ago   CESAREAN SECTION     x 4   CYSTOSCOPY  03/30/2022   Procedure: CYSTOSCOPY;  Surgeon: [name], MD;  Location: [facility];  Service: Gynecology;;   IR IMAGING GUIDED PORT INSERTION  04/19/2022   ROBOTIC ASSISTED TOTAL HYSTERECTOMY WITH BILATERAL SALPINGO OOPHERECTOMY Bilateral 03/30/2022   Procedure: XI ROBOTIC ASSISTED TOTAL HYSTERECTOMY WITH BILATERAL SALPINGO-OOPHORECTOMY, MINI LAPAROTOMY;  Surgeon: [name], MD;  Location[facility];  Service: Gynecology;  Laterality: Bilateral;   SENTINEL NODE BIOPSY N/A 03/30/2022   Procedure: SENTINEL NODE BIOPSY;  Surgeon: [name], MD;  Location[facility];  Service: Gynecology;  Laterality: N/A;  TUBAL LIGATION     at one of c-sections    Social:  Lives With: daughter Occupation: none Support: daughters Level of Function: independent ADLs and IADLs PCP: Sales executive, DO Substances: none  Family History: DM  Allergies: Allergies as of 06/22/2022 - Review Complete 06/20/2022  Allergen Reaction Noted   Nsaids Nausea And Vomiting 03/28/2022   Penicillins Other (See Comments) 11/01/2010    Review of Systems: A complete ROS was negative except as per HPI.   OBJECTIVE:   Physical Exam: Blood pressure (!) 104/52, pulse (!) 110, temperature (!) 100.7 F (38.2 C),  temperature source Oral, resp. rate (!) 21, last menstrual period 06/23/2014, SpO2 96 %.  Constitutional: drowsy appearing female, in no acute distress HENT: normocephalic atraumatic, mucous membranes moist Eyes: conjunctiva non-erythematous Neck: supple Cardiovascular: tachycardic regular rhythm, no m/r/g Pulmonary/Chest: normal work of breathing on room air, lungs clear to auscultation bilaterally, chemo port RU chest without erythema/edema Abdominal: soft, non-tender, non-distended MSK: normal bulk and tone Neurological: alert & oriented x 3, 5/5 strength in bilateral upper and lower extremities, EOM intact, no facial asymmetry, sensation grossly intact Skin: warm and dry Psych: mood and affect appropriate  Labs: CBC    Component Value Date/Time   WBC 4.3 06/22/2022 2308   RBC 2.96 (L) 06/22/2022 2308   HGB 7.8 (L) 06/23/2022 0206   HGB 11.1 (L) 06/09/2022 0913   HGB 13.3 06/25/2018 1613   HCT 23.0 (L) 06/23/2022 0206   HCT 39.7 06/25/2018 1613   PLT 159 06/22/2022 2308   PLT 228 06/09/2022 0913   PLT 280 06/25/2018 1613   MCV 94.9 06/22/2022 2308   MCV 98 (H) 06/25/2018 1613   MCH 31.1 06/22/2022 2308   MCHC 32.7 06/22/2022 2308   RDW 14.7 06/22/2022 2308   RDW 13.1 06/25/2018 1613   LYMPHSABS 1.2 06/22/2022 2308   MONOABS 0.5 06/22/2022 2308   EOSABS 0.0 06/22/2022 2308   BASOSABS 0.0 06/22/2022 2308     CMP     Component Value Date/Time   NA 134 (L) 06/23/2022 0206   NA 139 09/22/2021 0954   K 4.3 06/23/2022 0206   CL 99 06/22/2022 2308   CO2 20 (L) 06/22/2022 2308   GLUCOSE 144 (H) 06/22/2022 2308   BUN 24 (H) 06/22/2022 2308   BUN 13 09/22/2021 0954   CREATININE 3.28 (H) 06/22/2022 2308   CREATININE 0.63 06/09/2022 0913   CREATININE 0.67 04/23/2015 1645   CALCIUM 9.2 06/22/2022 2308   PROT 6.9 06/22/2022 2308   PROT 7.3 09/22/2021 0954   ALBUMIN 3.3 (L) 06/22/2022 2308   ALBUMIN 4.4 09/22/2021 0954   AST 17 06/22/2022 2308   AST 17 06/09/2022 0913    ALT 16 06/22/2022 2308   ALT 15 06/09/2022 0913   ALKPHOS 82 06/22/2022 2308   BILITOT 0.6 06/22/2022 2308   BILITOT 0.4 06/09/2022 0913   GFRNONAA 16 (L) 06/22/2022 2308   GFRNONAA >60 06/09/2022 0913   GFRNONAA >89 01/06/2014 1643   GFRAA >60 02/25/2020 0334   GFRAA >89 01/06/2014 1643    Imaging: DG Chest Port 1 View  Result Date: 06/22/2022 CLINICAL DATA:  Weakness and fever. EXAM: PORTABLE CHEST 1 VIEW COMPARISON:  June 29, 2021 FINDINGS: A right-sided venous Port-A-Cath is seen with its distal tip noted at the junction of the superior vena cava and right atrium. The heart size and mediastinal contours are within normal limits. Both lungs are clear. The visualized skeletal structures are unremarkable. IMPRESSION: No active disease.  Electronically Signed   By: Virgina Norfolk M.D.   On: 06/22/2022 23:29    EKG: personally reviewed my interpretation is sinus tachycardia.   ASSESSMENT & PLAN:    Assessment & Plan by Problem: Principal Problem:   Syncope Active Problems:   AKI (acute kidney injury) ([place])   Septic shock (HCC)   Jaelene A Garramone is a 61 y.o. with pertinent PMH of endometrial cancer on chemo and radiation who presented with syncope and admitted for syncope on hospital day 0  # Syncope # Shock Syncope most likely due to hypotension. Patient is at risk for septic shock given immunosuppressed status from chemo. She has low grade fever and hypotension, as well as N/V/D, however these can also be explained by her chemo. Given her poor PO intake and GI losses, she likely also has a component of hypovolemia. She has received 4L IVFs with minimal improvement in BP. Given poor response to fluid resuscitation, PCCM was consulted for pressor support. Peripheral levophed was initiated. She will be given addition 1L IVFs as well - PCCM to admit patient to ICU for monitoring overnight. Appreciate their assistance - continue IVFs - continue peripheral levo - empiric Vanc and  cefepime - follow up Bcx and Ucx - f/u PCT, cortisol - orthostatics  # AKI Likely prerenal given patients history.  - continue IVFs - monitor with daily BMP - lisinopril being held  # DM - holding metformin and semaglutide - started on long acting lower dose given poor PO intake.  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 Radiation therapy initiated 7/4. She receives chemo Q3 weeks. Reports last chemo session 2 weeks ago. Follows with Dr. Alvy Bimler. She receives Decadron prior to chemo tx. - monitor.    Diet: Carb-Modified VTE: Heparin IVF: LR,Bolus Code: Full Prior to Admission Living Arrangement: Home, living daughter Anticipated Discharge Location: Home Barriers to Discharge: medical stability Dispo: Admit patient to Inpatient with expected length of stay greater than 2 midnights.  Signed: Delene Ruffini, MD Internal Medicine Resident PGY-1 06/23/2022, 2:34 AM

## 2022-06-24 ENCOUNTER — Telehealth: Payer: Self-pay | Admitting: *Deleted

## 2022-06-24 ENCOUNTER — Other Ambulatory Visit: Payer: Self-pay

## 2022-06-24 DIAGNOSIS — E1169 Type 2 diabetes mellitus with other specified complication: Secondary | ICD-10-CM | POA: Diagnosis not present

## 2022-06-24 DIAGNOSIS — Z794 Long term (current) use of insulin: Secondary | ICD-10-CM

## 2022-06-24 DIAGNOSIS — R55 Syncope and collapse: Secondary | ICD-10-CM | POA: Diagnosis not present

## 2022-06-24 DIAGNOSIS — I959 Hypotension, unspecified: Secondary | ICD-10-CM

## 2022-06-24 DIAGNOSIS — D6489 Other specified anemias: Secondary | ICD-10-CM | POA: Diagnosis not present

## 2022-06-24 DIAGNOSIS — Z8542 Personal history of malignant neoplasm of other parts of uterus: Secondary | ICD-10-CM

## 2022-06-24 DIAGNOSIS — Z7984 Long term (current) use of oral hypoglycemic drugs: Secondary | ICD-10-CM

## 2022-06-24 DIAGNOSIS — Z7985 Long-term (current) use of injectable non-insulin antidiabetic drugs: Secondary | ICD-10-CM

## 2022-06-24 LAB — GLUCOSE, CAPILLARY
Glucose-Capillary: 185 mg/dL — ABNORMAL HIGH (ref 70–99)
Glucose-Capillary: 234 mg/dL — ABNORMAL HIGH (ref 70–99)
Glucose-Capillary: 207 mg/dL — ABNORMAL HIGH (ref 70–99)
Glucose-Capillary: 215 mg/dL — ABNORMAL HIGH (ref 70–99)

## 2022-06-24 LAB — CBC
HCT: 26.9 % — ABNORMAL LOW (ref 36.0–46.0)
Hemoglobin: 8.7 g/dL — ABNORMAL LOW (ref 12.0–15.0)
MCH: 30.6 pg (ref 26.0–34.0)
MCHC: 32.3 g/dL (ref 30.0–36.0)
MCV: 94.7 fL (ref 80.0–100.0)
Platelets: 187 10*3/uL (ref 150–400)
RBC: 2.84 MIL/uL — ABNORMAL LOW (ref 3.87–5.11)
RDW: 14.7 % (ref 11.5–15.5)
WBC: 4.7 10*3/uL (ref 4.0–10.5)
nRBC: 0 % (ref 0.0–0.2)

## 2022-06-24 LAB — BASIC METABOLIC PANEL
Anion gap: 10 (ref 5–15)
BUN: 9 mg/dL (ref 6–20)
CO2: 24 mmol/L (ref 22–32)
Calcium: 9 mg/dL (ref 8.9–10.3)
Chloride: 104 mmol/L (ref 98–111)
Creatinine, Ser: 0.96 mg/dL (ref 0.44–1.00)
GFR, Estimated: >60 mL/min (ref >60)
Glucose, Bld: 220 mg/dL — ABNORMAL HIGH (ref 70–99)
Potassium: 3.9 mmol/L (ref 3.5–5.1)
Sodium: 138 mmol/L (ref 135–145)

## 2022-06-24 MED ORDER — GABAPENTIN 300 MG PO CAPS
900.0000 mg | ORAL_CAPSULE | Freq: Three times a day (TID) | ORAL | Status: DC
  Administered 2022-06-24 – 2022-06-25 (×4): 900 mg via ORAL
  Filled 2022-06-24 (×4): qty 3

## 2022-06-24 MED ORDER — INSULIN GLARGINE-YFGN 100 UNIT/ML ~~LOC~~ SOLN
20.0000 [IU] | Freq: Every day | SUBCUTANEOUS | Status: DC
  Administered 2022-06-24: 20 [IU] via SUBCUTANEOUS
  Filled 2022-06-24 (×2): qty 0.2

## 2022-06-24 MED ORDER — LISINOPRIL 20 MG PO TABS
20.0000 mg | ORAL_TABLET | Freq: Every day | ORAL | Status: DC
  Administered 2022-06-24: 20 mg via ORAL
  Filled 2022-06-24: qty 1

## 2022-06-24 MED ORDER — ACETAMINOPHEN 500 MG PO TABS
1000.0000 mg | ORAL_TABLET | Freq: Once | ORAL | Status: DC
Start: 2022-06-24 — End: 2022-06-25

## 2022-06-24 MED ORDER — TRAMADOL HCL 50 MG PO TABS
50.0000 mg | ORAL_TABLET | Freq: Once | ORAL | Status: AC
  Administered 2022-06-24: 50 mg via ORAL
  Filled 2022-06-24: qty 1

## 2022-06-24 MED ORDER — TRAMADOL HCL 50 MG PO TABS
100.0000 mg | ORAL_TABLET | Freq: Four times a day (QID) | ORAL | Status: DC | PRN
  Filled 2022-06-24: qty 2

## 2022-06-24 MED ORDER — TRAZODONE HCL 50 MG PO TABS
50.0000 mg | ORAL_TABLET | Freq: Every evening | ORAL | Status: DC | PRN
  Administered 2022-06-24: 50 mg via ORAL
  Filled 2022-06-24: qty 1

## 2022-06-24 NOTE — Telephone Encounter (Signed)
CALLED PATIENT TO REMIND OF HDR [place] 06-27-22 @ 9 AM, UNABLE TO LVM DUE TO VM BEING FULL, CALLED PATIENT'S DAUGHTER- JASMINE AND SPOKE WITH HER AND SHE STATED THAT HER MOM IS IN THE HOSPITAL, NOTIFIED DR. [place]

## 2022-06-24 NOTE — Progress Notes (Signed)
This RN contacted by Lexine Baton, RN who stated that this patient was unhappy with her care and wished to speak with hospital administration.   Prior to responding to the unit, at about 0200, I was in contact with the care team for this patient regarding the plan of care for her pain. The provider relayed to this RN via proxy that orders were placed for Tylenol and Trazodone at the request of Lexine Baton, RN earlier when they were contacted, but they had received no further information from unit personnel. I then spoke to [place], [place] about this and she stated that the medications were offered as ordered but the patient had declined stating that the ordered medications will not help with the pain she is feeling. This information was relayed to the provider and via proxy the response was that this plan of care was chosen related to her arriving to the hospital with hypotension/syncope and a recent need for blood pressure supporting medications in the ICU.   I responded to the unit and spoke with the patient for nearly an hour.   Patient is visibly upset. Laying in bed supine but appears restless. Awake at the time I arrived around 0300, alert, and fully oriented.   Patient explains that she began requesting medication to manage her pain last evening at 2130 and has yet to receive suitable medication. She states that she feels we're neglecting her and ignoring her needs. Goes on to state that the providers clearly didn't evaluate her chart to see that she takes the medications she has requested at home, neither did they look to see that her vital signs and mental status have been okay all day long, nor did they speak with her on the phone or come to the room to evaluate her directly. States that she feels disrespected and unrecognized.   Apologies made for the unmet expectations related to the current plan of care. Acknowledged her frustration for waiting this long for pain management.   Relayed the physician's concerns  with ordering additional pain medications aforementioned in this note.   Patient unhappy with the provider's judgement in this matter and reiterates that the ordered Tylenol will not help with her pain, especially now that she has waited for so long. Encouraged patient to try the Tylenol and we can then reassess her pain. Patient agreed to try the Tylenol stating "since that seems to be the only option". Patient also requested bathing supplies.   Carma Lair, RN of requests and exited the department.  -Contacted provider team again and advised them of patient's statements and continued requests for further pain management.   D.Ignacia Gentzler, RN, BSN, Hydrographic surveyor - [place]

## 2022-06-24 NOTE — TOC Initial Note (Signed)
Transition of Care [facility]) - Initial/Assessment Note    Patient Details  Name: [name] MRN: [id number] Date of Birth: 12/05/1960  [facility]) CM/SW Contact:    Tom-Johnson, Renea Ee, RN Phone Number: 06/24/2022, 3:11 PM  Clinical Narrative:                  CM spoke with patient at bedside about needs for post hospital transition. Admitted for Syncope.  From home with daughter, Newton Pigg. Has four supporting adult children. Has Hx of Endometrial Cancer, on Chemo and Radiation. States she gets fatigued since she started Chemo and Radiation.  Patient is on Disability. Uses Medicaid transportation to and from Her appointments.  PCP is Masters, [name], DO with [facility] and uses [facility] on [address].  NO TOC needs or recommendation noted at this time. CM will continue to follow with needs as patient progresses with care.     Expected Discharge Plan: Home/Self Care Barriers to Discharge: Continued Medical Work up   Patient Goals and CMS Choice Patient states their goals for this hospitalization and ongoing recovery are:: To return homej CMS Medicare.gov Compare Post Acute Care list provided to:: Patient Choice offered to / list presented to : NA  Expected Discharge Plan and Services Expected Discharge Plan: Home/Self Care   Discharge Planning Services: CM Consult Post Acute Care Choice: NA Living arrangements for the past 2 months: [place]                 DME Arranged: N/A DME Agency: NA       HH Arranged: NA [place] Agency: NA        Prior Living Arrangements/Services Living arrangements for the past 2 months: [place] Lives with:: Adult Children (Daughter, Jasmin) Patient language and need for interpreter reviewed:: Yes Do you feel safe going back to the place where you live?: Yes      Need for Family Participation in Patient Care: Yes (Comment) Care giver support system in place?: Yes (comment)    Criminal Activity/Legal Involvement Pertinent to Current Situation/Hospitalization: No - Comment as needed  Activities of Daily Living      Permission Sought/Granted Permission sought to share information with : Case Manager, Family Supports Permission granted to share information with : Yes, Verbal Permission Granted              Emotional Assessment Appearance:: Appears stated age Attitude/Demeanor/Rapport: Engaged, Gracious Affect (typically observed): Accepting, Appropriate, Calm, Hopeful Orientation: : Oriented to Self, Oriented to Place, Oriented to  Time, Oriented to Situation Alcohol / Substance Use: Not Applicable Psych Involvement: No (comment)  Admission diagnosis:  Syncope [R55] Septic shock ([place]) [A41.9, R65.21] Sepsis with acute renal failure and septic shock, due to unspecified organism, unspecified acute renal failure type ([place]) [A41.9, R65.21, N17.9] Patient Active Problem List   Diagnosis Date Noted   Syncope 06/23/2022   Septic shock ([place]) 06/23/2022   Anemia due to antineoplastic chemotherapy 05/19/2022   Insomnia 05/02/2022   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[place]) 04/08/2022   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   Obesity, Class II, BMI 35-39.9 03/08/2022   Visit for routine gyn exam 11/17/2021   PMB (postmenopausal bleeding) 11/17/2021   Dizziness 09/22/2021   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   Gangrene of toe of right foot ([place]) 04/14/2021   Thoracic outlet syndrome 07/12/2020   Shoulder pain 05/29/2020   Neck pain 05/06/2020   Colon cancer screening 05/05/2020   Encounter for screening mammogram  for malignant neoplasm of breast 05/05/2020   Chest pain 02/24/2020   Low back pain 01/20/2020   Lumbar radiculopathy 10/28/2019   Strain of lumbar paraspinal muscle, initial encounter 09/10/2019   Hand pain, right 03/14/2019   Uncontrolled diabetes mellitus with hyperglycemia ([place]) 07/26/2018   Long term current use of opiate analgesic  01/31/2017   Opioid dependence ([place]) 11/27/2016   [place] neuroma 11/02/2016   Broken teeth 11/02/2016   Diabetic ulcer of toe of left foot associated with diabetes mellitus due to underlying condition ([place]) 11/02/2016   Hyperlipidemia associated with type 2 diabetes mellitus ([place]) 04/22/2016   Severe obesity (BMI 35.0-39.9) 01/22/2016   GERD (gastroesophageal reflux disease) 03/03/2014   Health care maintenance 01/06/2014   AKI (acute kidney injury) ([place]) 02/18/2013   Neuropathy in diabetes ([place]) 11/01/2010   ASTHMA 11/01/2010   Hypertension 11/28/2008   Diabetes mellitus ([place]) 11/28/1988   PCP:  Christiana Fuchs, DO Pharmacy:   [facility] [place] [address] Phone: [phone] Fax: [place], [address] [place] [address] Phone: [phone] Fax: [phone]     Social Determinants of Health (SDOH) Interventions    Readmission Risk Interventions     No data to display

## 2022-06-24 NOTE — Telephone Encounter (Signed)
CALLED PATIENT TO REMIND OF HDR [place] 06-27-22, UNABLE TO LEAVE MESSAGE DUE TO VM BEING FULL

## 2022-06-24 NOTE — Progress Notes (Addendum)
Subjective:   Transfer summary-61 year old with history of endometrial cancer, diabetes, hypertension, asthma, hyperlipidemia presenting with syncope, sepsis and hypotension.  She initially received 3 L LR bolus and pressures did not improve so was admitted to the ICU for pressor support.  She only required Levophed overnight and pressures have remained stable since.  There was initial concern for sepsis given she is immunocompromised however no infectious etiology has been found.  Overnight patient was in pain and elink physician declined her home medications due to recent hypotension syncope and AKI.  She was visibly upset this morning expressing her frustration about how this was handled.  She received trazodone and tramadol early this morning but states that she had a rough night.  She was tearful during this discussion.  She was reevaluated later this morning, comfortably resting in the bed.  She states that she is feeling much better.  She is having a headache but denies any other complaints.  Denies any cough, fevers, shortness of breath or abdominal pain.  No dysuria.  States that she felt very weak and fatigued and had a syncopal episode when they were out for dinner and so her family called EMS.  She denies losing consciousness or hitting her head.  She has not noted any fevers or other symptoms at home prior to this.  States that since starting chemo and radiation she is just felt very weak, fatigued and had significantly decreased oral intake.  She has not been drinking enough fluids.  She is having some tugging pain at the site of her right chest wall port.  Objective:  Vital signs in last 24 hours: Vitals:   06/23/22 1900 06/23/22 1955 06/23/22 2150 06/24/22 0525  BP: (!) 139/114  (!) 145/78 (!) 145/74  Pulse: (!) 108  (!) 101 (!) 101  Resp: '16  18 18  '$ Temp:  98.7 F (37.1 C) 100.1 F (37.8 C) 99 F (37.2 C)  TempSrc:  Oral Oral   SpO2: 96%  100% 100%  Weight:      Height:        Physical Exam General: alert, appears stated age, in no acute distress, initially tearful on exam HEENT: Normocephalic, atraumatic, EOM intact, conjunctiva normal CV: Regular rate and rhythm, no murmurs rubs or gallops Pulm: Clear to auscultation bilaterally, normal work of breathing Abdomen: Soft, nondistended, bowel sounds present, no tenderness to palpation MSK: No lower extremity edema Skin: Warm and dry, right chest wall port appears clean and dry Neuro: Alert and oriented x3   Assessment/Plan:  Principal Problem:   Syncope Active Problems:   AKI (acute kidney injury) ([place])   Septic shock ([place])  61 year old with history of endometrial cancer, diabetes, hypertension, asthma, hyperlipidemia presenting with syncope, sepsis and hypotension admitted to the ICU and required a brief period of pressor support.  Syncope Hypotension  Patient presented after several days of feeling generalized weakness, fatigue and having a syncopal episode.  She remained hypotensive despite 3 L LR bolus and admitted to the ICU for a brief period of pressor support.  There was concern for sepsis due to a low-grade fever of 100.7 but lactate was reassuring and cultures have shown no growth to date.  Procalcitonin elevated at 1. She was started on empiric vancomycin and cefepime.  Cortisol was checked and within normal limits. She is immunocompromised and at high risk due  to her chemotherapy but no clear infectious etiology thus far.  I suspect this secondary to hypovolemia was in the setting  of decreased oral intake.  She may benefit from an appetite stimulant.   -Discontinue antibiotics and monitor -Follow-up blood cultures -Trend WBC/fever curve -Monitor blood pressure  AKI, resolved Creatinine 0.96, back to baseline. -Trend BMP  Diabetes Home medications include metformin, semaglutide, insulin glargine 80 units at bedtime, insulin lispro 16-18 units with breakfast and lunch and 14 units with  dinner.  In the setting of decreased oral intake will resume low-dose insulin glargine. -Resume insulin glargine 20 units -SSI -CBG monitoring   Hypertension Blood pressures have improved with SBP's in the 140s.   -Resume home lisinopril  Anemia of chronic disease Hemoglobin 8.7.  No signs or symptoms of bleeding. - Trend CBC - Transfuse for hemoglobin less than 7  History of endometrial cancer Status post TAH/BSO in May 2023 followed by chemo initiation 6/2 with adjuvant radiation therapy.  She is on Decadron prior to chemotherapy.  Prior to Admission Living Arrangement: Home Anticipated Discharge Location: Home Barriers to Discharge: clinical workup  Dispo: Anticipated discharge in approximately 1 day(s).   Sereen Schaff N, DO 06/24/2022, 7:17 AM  After 5pm on weekdays and 1pm on weekends: On Call pager [phone]

## 2022-06-24 NOTE — Progress Notes (Signed)
   06/23/22 2230  Vitals  Pulse Rate (!) 105  Level of Consciousness  Level of Consciousness Alert  MEWS COLOR  MEWS Score Color Green  Pain Assessment  Pain Scale 0-10  Pain Score 8  Complaints & Interventions  Complains of Other (Comment) (Generalized pain)  MEWS Score  MEWS Temp 0  MEWS Systolic 0  MEWS Pulse 1  MEWS RR 0  MEWS LOC 0  MEWS Score 1  Provider Notification  Provider Name/Title Dr. Jenny Reichmann  Date Provider Notified 06/23/22  Time Provider Notified 2230  Method of Notification Call  Notification Reason Requested by patient/family (Request pain med/ sleep med)  Provider response No new orders  Date of Provider Response 06/23/22  Time of Provider Response 2240   Tylenol and Trazadone ordered for patient but patient states they are ineffective without the Tramadol that she takes regularly. Pt very upset about her pain not being managed and is requesting for me to page MD again.

## 2022-06-24 NOTE — Progress Notes (Signed)
eLink Physician-Brief Progress Note Patient Name: [name] DOB: [birth year] MRN: [id number]   Date of Service  06/24/2022  HPI/Events of Note  61 year old with history of endometrial cancer, diabetes, hypertension, asthma, hyperlipidemia presenting with syncope, sepsis and hypotension. Was on low dose levophed as recently as 7/27 AM- now called by RN requesting pain medications, Gabapentin, Trazodone, Ultram.    eICU Interventions   -expressed concerns regarding prescribing opiates/sedatives to this complicated patient recently sent out of ICU, recently hypotensive, syncopal, with ongoing mild AKI, and inability to view patient on camera (now on medical floor) -have ordered one time Trazodone, one time Ultram.  -monitor for sedation        Ellyson Rarick N [place] 06/24/2022, 4:45 AM

## 2022-06-24 NOTE — Progress Notes (Signed)
  Radiation Oncology         [phone] ________________________________  Name: [name] MRN: [id number]  Date: 06/27/2022  DOB: 01/23/1961  CC: Masters, [name], DO  [name], MD  HDR BRACHYTHERAPY NOTE  DIAGNOSIS: The encounter diagnosis was 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[facility]).   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 She will be treated with a 3.0 cm diameter segmented cylinder. This conforms to her anatomy without undue discomfort.  Vaginal brachytherapy procedure node: The patient was brought to the [place] suite. Identity was confirmed. All relevant records and images related to the planned course of therapy were reviewed. The patient freely provided informed written consent to proceed with treatment after reviewing the details related to the planned course of therapy. The consent form was witnessed and verified by the simulation staff. Then, the patient was set-up in a stable reproducible supine position for radiation therapy. Pelvic exam revealed the vaginal cuff to be intact . The patient's custom vaginal cylinder was placed in the proximal vagina. This was affixed to the CT/MR stabilization plate to prevent slippage. Patient tolerated the placement well.  Verification simulation note:  A fiducial marker was placed within the vaginal cylinder. An AP and lateral film was then obtained through the pelvis area. This documented accurate position of the vaginal cylinder for treatment.  HDR BRACHYTHERAPY TREATMENT  The remote afterloading device was affixed to the vaginal cylinder by catheter. Patient then proceeded to  undergo her third high-dose-rate treatment directed at the proximal vagina. The patient was prescribed a dose of 6.0 gray to be delivered to the mucosal surface. Treatment length was 3.0 cm. Patient was treated with 1 channel using 7 dwell positions. Treatment time was 180.3 seconds. Iridium 192 was the high-dose-rate source for treatment. The patient tolerated the treatment well. After completion of her therapy, a radiation survey was performed documenting return of the iridium source into the GammaMed safe.   PLAN: The patient will return next week for her fourth high-dose-rate treatment.  Last week the patient was admitted to the hospital to rule out sepsis.  She has been discharged a few days.  She is feeling much better at this time. ________________________________  -----------------------------------  Blair Promise, PhD, MD  This document serves as a record of services personally performed by Gery Pray, MD. It was created on his behalf by Roney Mans, a trained medical scribe. The creation of this record is based on the scribe's personal observations and the provider's statements to them. This document has been checked and approved by the attending provider.

## 2022-06-24 NOTE — Progress Notes (Signed)
   06/24/22 0130  Pain Assessment  Pain Scale 0-10  Pain Score 8  Provider Notification  Provider Name/Title Tilda Burrow, RN, [facility]  Date Provider Notified 06/24/22  Time Provider Notified 0120  Method of Notification Call  Notification Reason Requested by patient/family   [facility] to come and speak with patient.

## 2022-06-25 DIAGNOSIS — N179 Acute kidney failure, unspecified: Secondary | ICD-10-CM

## 2022-06-25 DIAGNOSIS — E1165 Type 2 diabetes mellitus with hyperglycemia: Secondary | ICD-10-CM

## 2022-06-25 DIAGNOSIS — C541 Malignant neoplasm of endometrium: Secondary | ICD-10-CM | POA: Diagnosis not present

## 2022-06-25 DIAGNOSIS — R55 Syncope and collapse: Secondary | ICD-10-CM | POA: Diagnosis not present

## 2022-06-25 DIAGNOSIS — D638 Anemia in other chronic diseases classified elsewhere: Secondary | ICD-10-CM

## 2022-06-25 LAB — BASIC METABOLIC PANEL
Anion gap: 6 (ref 5–15)
BUN: 5 mg/dL — ABNORMAL LOW (ref 6–20)
CO2: 28 mmol/L (ref 22–32)
Calcium: 9.1 mg/dL (ref 8.9–10.3)
Chloride: 103 mmol/L (ref 98–111)
Creatinine, Ser: 0.87 mg/dL (ref 0.44–1.00)
GFR, Estimated: >60 mL/min (ref >60)
Glucose, Bld: 209 mg/dL — ABNORMAL HIGH (ref 70–99)
Potassium: 4.1 mmol/L (ref 3.5–5.1)
Sodium: 137 mmol/L (ref 135–145)

## 2022-06-25 LAB — CBC
HCT: 27.1 % — ABNORMAL LOW (ref 36.0–46.0)
Hemoglobin: 8.9 g/dL — ABNORMAL LOW (ref 12.0–15.0)
MCH: 30.8 pg (ref 26.0–34.0)
MCHC: 32.8 g/dL (ref 30.0–36.0)
MCV: 93.8 fL (ref 80.0–100.0)
Platelets: 204 10*3/uL (ref 150–400)
RBC: 2.89 MIL/uL — ABNORMAL LOW (ref 3.87–5.11)
RDW: 14.6 % (ref 11.5–15.5)
WBC: 4.6 10*3/uL (ref 4.0–10.5)
nRBC: 0 % (ref 0.0–0.2)

## 2022-06-25 LAB — GLUCOSE, CAPILLARY
Glucose-Capillary: 233 mg/dL — ABNORMAL HIGH (ref 70–99)
Glucose-Capillary: 173 mg/dL — ABNORMAL HIGH (ref 70–99)

## 2022-06-25 MED ORDER — INSULIN LISPRO 100 UNIT/ML IJ SOLN: 7.0000 [IU] | Freq: Three times a day (TID) | INTRAMUSCULAR | 3 refills | Status: DC

## 2022-06-25 MED ORDER — HEPARIN SOD (PORK) LOCK FLUSH 100 UNIT/ML IV SOLN
500.0000 [IU] | INTRAVENOUS | Status: AC | PRN
  Administered 2022-06-25: 500 [IU]

## 2022-06-25 MED ORDER — LANTUS SOLOSTAR 100 UNIT/ML ~~LOC~~ SOPN: 30.0000 [IU] | PEN_INJECTOR | Freq: Every day | SUBCUTANEOUS | 2 refills | Status: DC

## 2022-06-25 MED ORDER — INSULIN ASPART 100 UNIT/ML IJ SOLN
7.0000 [IU] | Freq: Three times a day (TID) | INTRAMUSCULAR | Status: DC
  Administered 2022-06-25 (×2): 7 [IU] via SUBCUTANEOUS

## 2022-06-25 NOTE — Progress Notes (Signed)
DISCHARGE NOTE HOME [name] to be discharged Home per MD order. Discussed prescriptions and follow up appointments with the patient. Prescriptions given to patient; medication list explained in detail. Patient verbalized understanding.  Skin clean, dry and intact without evidence of skin break down, no evidence of skin tears noted. IV catheter discontinued intact. Site without signs and symptoms of complications. Dressing and pressure applied. Pt denies pain at the site currently. No complaints noted.  Patient free of lines, drains, and wounds.   An After Visit Summary (AVS) was printed and given to the patient. Patient escorted via wheelchair, and discharged home via private auto.  Jacson Rapaport S Quintel Mccalla, RN

## 2022-06-25 NOTE — Discharge Instructions (Addendum)
Ms. Hlad,  It was a pleasure taking care of you at [place] were admitted for septic shock and had a brief stay in the ICU due to this. We did not find any evidence of infection so your low blood pressure and syncope was likely due to severe dehydration. We are discharging you home now that you are doing better. Please follow the following instructions.   1) For your hypotension, make sure to stay hydrated by drinking at least 2L of water daily  -Check your blood pressure daily, hold your lisinopril if the top number is less than 110 2) For your diabetes, A1c is now down to 5.7%. Great job.   -Decrease Lantus to 30 units daily at bedtime  -Decrease Humalog to 7 units 3 times daily with meals  -Check your blood sugar three times daily and follow-up next week for adjustments 3) For your endometrial cancer, continue your treatments and follow-up with the cancer doctors. Discuss with them about prescribing something to help with your appetite after your chemo sessions.   Take care,  Dr. Linwood Dibbles, MD, MPH

## 2022-06-25 NOTE — Discharge Summary (Addendum)
Name: [name] MRN: [id number] DOB: 02/03/1961 61 y.o. PCP: Christiana Fuchs, DO  Date of Admission: 06/22/2022 10:34 PM Date of Discharge:   06/25/2022 Attending Physician: Dr. Saverio Danker  Discharge Diagnosis: Principal Problem:   Syncope Active Problems:   Diabetes mellitus ([place])   AKI (acute kidney injury) ([place])   Septic shock ([place])    Discharge Medications: Allergies as of 06/25/2022       Reactions   Nsaids Nausea And Vomiting   Acid reflux   Penicillins Other (See Comments)   Unknown Did it involve swelling of the face/tongue/throat, SOB, or low BP? Unk Did it involve sudden or severe rash/hives, skin peeling, or any reaction on the inside of your mouth or nose? Unk  Did you need to seek medical attention at a hospital or doctor's office? Unk When did it last happen? Childhood      If all above answers are "NO", may proceed with cephalosporin use.        Medication List     TAKE these medications    acetaminophen 650 MG CR tablet Commonly known as: TYLENOL Take 1,300-1,950 mg by mouth every 8 (eight) hours as needed for pain.   atorvastatin 80 MG tablet Commonly known as: Lipitor Take 1 tablet (80 mg total) by mouth daily.   dexamethasone 4 MG tablet Commonly known as: DECADRON Take 2 tabs at the night before chemotherapy, every 3 weeks, by mouth x 6 cycles   gabapentin 300 MG capsule Commonly known as: NEURONTIN Take 3 capsules (900 mg total) by mouth 3 (three) times daily.   insulin lispro 100 UNIT/ML injection Commonly known as: HUMALOG Inject 0.07 mLs (7 Units total) into the skin 3 (three) times daily with meals. What changed:  how much to take how to take this when to take this additional instructions   Lantus SoloStar 100 UNIT/ML Solostar Pen Generic drug: insulin glargine Inject 30 Units into the skin at bedtime. What changed: how much to take   lidocaine-prilocaine cream Commonly known as: EMLA Apply to affected area once    lisinopril 20 MG tablet Commonly known as: ZESTRIL Take 1 tablet (20 mg total) by mouth daily.   meclizine 25 MG tablet Commonly known as: ANTIVERT Take 1 tablet (25 mg total) by mouth 3 (three) times daily as needed for dizziness.   metFORMIN 500 MG 24 hr tablet Commonly known as: GLUCOPHAGE-XR Take 4 tablets (2,000 mg total) by mouth at bedtime.   multivitamin with minerals Tabs tablet Take 1 tablet by mouth every evening.   omeprazole 20 MG capsule Commonly known as: PRILOSEC Take 1 capsule (20 mg total) by mouth daily. What changed:  when to take this reasons to take this   ondansetron 8 MG tablet Commonly known as: Zofran Take 1 tablet (8 mg total) by mouth 2 (two) times daily as needed. Start on the third day after chemotherapy.   prochlorperazine 10 MG tablet Commonly known as: COMPAZINE Take 1 tablet (10 mg total) by mouth every 6 (six) hours as needed (Nausea or vomiting).   Semaglutide (2 MG/DOSE) 8 MG/3ML Sopn Inject 2 mg into the skin once a week.   traMADol 50 MG tablet Commonly known as: ULTRAM Take 2 tablets (100 mg total) by mouth every 6 (six) hours as needed for severe pain.   traZODone 50 MG tablet Commonly known as: DESYREL Take 1 tablet (50 mg total) by mouth at bedtime as needed for sleep.        Disposition  and follow-up:   [name] was discharged from [facility] in Stable condition.  At the hospital follow up visit please address:  1.  Follow-up:  a.  Hypotension: Briefly admitted to the ICU for possible septic shock however no infectious etiology was identified.  Her pressure improved with IV fluids and a short time on levophed. Recheck BP and volume status.     b.  T2DM with hyperglycemia: A1c has improved to 5.7% but blood sugar still elevated to 170s-200s.  Discharged home on Lantus 30 units at bedtime and Humalog 7 units with meals. Please check blood sugar readings and adjust insulin regimen as  appropriate.   c.  Endometrial cancer: Last chemo session 3 weeks ago.  Due for full radiation therapy on 7/31.  Please ensure patient follows up for radiation and has follow-up with oncology   d.  AKI: Likely prerenal. Resolved with IV fluids. Repeat BMP to ensure kidney function stable.  2.  Labs / imaging needed at time of follow-up: BMP, CBC  3.  Pending labs/ test needing follow-up: None  Follow-up Appointments:   06/27/2022 for radiation therapy   Hospital follow-up with [facility] next week   07/04/2022 with medical oncology and radiation oncology  Hospital Course by problem list: [name] is a 61 year old lady with history of endometrial cancer, diabetes, hypertension, asthma, hyperlipidemia who presented with syncope, hypotension and possible sepsis admitted to the ICU and required a brief period of pressor support.   Syncope Hypotension  Patient presented after several days of feeling generalized weakness, fatigue and having a syncopal episode. She remained hypotensive despite 3 L LR bolus and admitted to the ICU on day of admission (7/27) for possible septic shock and required a brief period of pressor support. There was concern for sepsis due to a low-grade fever of 100.7 but lactate was reassuring and cultures have shown no growth to date. Procalcitonin was elevated at 1. She was started on empiric vancomycin and cefepime. Cortisol was checked and within normal limits. She is immunocompromised and at high risk due  to her chemotherapy but no clear infectious etiology was found. Her presentation was suspected to be secondary to hypovolemia in the setting of decreased oral intake. She was transferred out of the ICU on 7/28 and she regained her appetite the day before discharge. Antibiotics were discontinued since blood cultures were negative after 2 days. SBP was elevated to the 140s so her lisinopril was restarted. On day of discharge, patient was tolerating oral intake well.  Blood pressure  was stable with SBP in the 110s to 120s.  No further fevers or hypotension. She was advised to stay hydrated at home, check BPs daily and hold her lisinopril if SBP is less than 110.  Type 2 diabetes Home medications include metformin, semaglutide, insulin glargine 80 units at bedtime, insulin lispro 16-18 units with breakfast and lunch and 14 units with dinner. Found to have A1c of 5.7% on admission, significant improvement from 7.8% 3 months ago. Insulin was initially held but resumed the night before day of discharge with Semglee 20 units at bedtime. Blood sugars remained elevated with CBGs in the 170s to 230s. She was discharged home on Lantus 30 units at bedtime and Humalog 7 units 3 times daily with meals. She advised to check blood sugar 3 times daily at home and follow-up with [facility] clinic in 1 week for repeat adjustment.   History of endometrial cancer Status post TAH/BSO in May 2023 followed  by chemo initiation 6/2 with adjuvant radiation therapy.  She is on Decadron prior to chemotherapy.  Scheduled for radiation therapy on 7/31 with oncology follow-up on 8/7.  AKI Patient found to have bump in creatinine to 3.28 on admission.  This was thought to be prerenal in the setting of poor p.o. intake.  Significantly improved with IV fluids. Creatinine at discharge was 0.87.   Anemia of chronic disease Found to have a hemoglobin of 9.2 on day of admission, down from 11.12 weeks prior to admission.  Patient did not have any evidence of active GI bleed. Hemoglobin remained stable at 8.9 on day of discharge.    Subjective: Patient evaluated today laying comfortably in bed. States she feels much better today. She suddenly regained her appetite yesterday evening and ate some chicken wings and soup for her son brought her. Feels ready to go home. She reported one bad experience with an [facility] doctor and a bad experience 2 nights ago with another doctor here at [facility]. States she plans to file a complaint with  the administration about that encounter but also states her nurse and her current medical team were great.   Discharge Vitals:   BP 112/81 (BP Location: Left Arm)   Pulse 100   Temp 98.7 F (37.1 C) (Oral)   Resp 18   Ht '6\' 1"'$  (1.854 m)   Wt 123.2 kg   LMP 06/23/2014   SpO2 100%   BMI 35.83 kg/m  Discharge exam: General: Pleasant, well-appearing elderly woman in bed. No acute distress. CV: RRR. No murmurs, rubs, or gallops. No LE edema Pulmonary: Lungs CTAB. Normal effort. No wheezing or rales. Abdominal: Soft, nontender, nondistended. Normal bowel sounds. Extremities: Radial and DP pulses 2+ and symmetric. Normal ROM. Skin: Warm and dry. No obvious rash or lesions. Neuro: A&Ox3. Moves all extremities. Normal sensation. No focal deficit. Psych: Mild frustration.   Pertinent Labs, Studies, and Procedures:     Latest Ref Rng & Units 06/25/2022   10:42 AM 06/24/2022    2:57 AM 06/23/2022    7:32 AM  CBC  WBC 4.0 - 10.5 K/uL 4.6  4.7  5.1   Hemoglobin 12.0 - 15.0 g/dL 8.9  8.7  9.1   Hematocrit 36.0 - 46.0 % 27.1  26.9  27.2   Platelets 150 - 400 K/uL 204  187  176        Latest Ref Rng & Units 06/25/2022   10:42 AM 06/24/2022    2:57 AM 06/23/2022    7:32 AM  CMP  Glucose 70 - 99 mg/dL 209  220  142   BUN 6 - 20 mg/dL '5  9  19   '$ Creatinine 0.44 - 1.00 mg/dL 0.87  0.96  1.79   Sodium 135 - 145 mmol/L 137  138  137   Potassium 3.5 - 5.1 mmol/L 4.1  3.9  4.0   Chloride 98 - 111 mmol/L 103  104  105   CO2 22 - 32 mmol/L '28  24  23   '$ Calcium 8.9 - 10.3 mg/dL 9.1  9.0  9.2     ECHOCARDIOGRAM COMPLETE  Result Date: 06/23/2022    ECHOCARDIOGRAM REPORT   Patient Name:   [name] Date of Exam: 06/23/2022 Medical Rec #:  [id number]        Height:       73.0 in Accession #:    [id number]       Weight:       271.6  lb Date of Birth:  03-10-1961        BSA:          2.451 m Patient Age:    61 years         BP:           137/66 mmHg Patient Gender: F                HR:            106 bpm. Exam Location:  Inpatient Procedure: 2D Echo, Cardiac Doppler and Color Doppler Indications:    Septic shock  History:        Patient has no prior history of Echocardiogram examinations.                 Signs/Symptoms:Syncope; Risk Factors:Diabetes and Hypertension.                 AKI. Cancer. Currently receiving chemotherapy and radiation                 treatments.  Sonographer:    Clayton Lefort RDCS (AE) Referring Phys: [id number] [facility] P DESAI  Sonographer Comments: Patient is morbidly obese. Image acquisition challenging due to patient body habitus. IMPRESSIONS  1. Left ventricular ejection fraction, by estimation, is 55 to 60%. The left ventricle has normal function. The left ventricle has no regional wall motion abnormalities. Left ventricular diastolic parameters are consistent with Grade I diastolic dysfunction (impaired relaxation).  2. Right ventricular systolic function is normal. The right ventricular size is normal. There is normal pulmonary artery systolic pressure. The estimated right ventricular systolic pressure is 36.4 mmHg.  3. The mitral valve is normal in structure. No evidence of mitral valve regurgitation. No evidence of mitral stenosis.  4. The aortic valve is tricuspid. Aortic valve regurgitation is not visualized. No aortic stenosis is present.  5. The inferior vena cava is normal in size with greater than 50% respiratory variability, suggesting right atrial pressure of 3 mmHg. FINDINGS  Left Ventricle: Left ventricular ejection fraction, by estimation, is 55 to 60%. The left ventricle has normal function. The left ventricle has no regional wall motion abnormalities. The left ventricular internal cavity size was normal in size. There is  no left ventricular hypertrophy. Left ventricular diastolic parameters are consistent with Grade I diastolic dysfunction (impaired relaxation). Right Ventricle: The right ventricular size is normal. No increase in right ventricular wall thickness.  Right ventricular systolic function is normal. There is normal pulmonary artery systolic pressure. The tricuspid regurgitant velocity is 2.71 m/s, and  with an assumed right atrial pressure of 3 mmHg, the estimated right ventricular systolic pressure is 68.0 mmHg. Left Atrium: Left atrial size was normal in size. Right Atrium: Right atrial size was normal in size. Pericardium: There is no evidence of pericardial effusion. Mitral Valve: The mitral valve is normal in structure. No evidence of mitral valve regurgitation. No evidence of mitral valve stenosis. Tricuspid Valve: The tricuspid valve is normal in structure. Tricuspid valve regurgitation is trivial. Aortic Valve: The aortic valve is tricuspid. Aortic valve regurgitation is not visualized. No aortic stenosis is present. Aortic valve mean gradient measures 7.0 mmHg. Aortic valve peak gradient measures 12.7 mmHg. Aortic valve area, by VTI measures 2.39  cm. Pulmonic Valve: The pulmonic valve was normal in structure. Pulmonic valve regurgitation is not visualized. Aorta: The aortic root is normal in size and structure. Venous: The inferior vena cava is normal in size with greater than 50% respiratory variability,  suggesting right atrial pressure of 3 mmHg. IAS/Shunts: No atrial level shunt detected by color flow Doppler.  LEFT VENTRICLE PLAX 2D LVIDd:         4.00 cm   Diastology LVIDs:         2.80 cm   LV e' medial:    8.05 cm/s LV PW:         1.10 cm   LV E/e' medial:  12.5 LV IVS:        0.90 cm   LV e' lateral:   11.50 cm/s LVOT diam:     2.00 cm   LV E/e' lateral: 8.8 LV SV:         62 LV SV Index:   25 LVOT Area:     3.14 cm  RIGHT VENTRICLE             IVC RV Basal diam:  2.50 cm     IVC diam: 1.40 cm RV S prime:     21.00 cm/s TAPSE (M-mode): 1.5 cm LEFT ATRIUM             Index        RIGHT ATRIUM           Index LA diam:        2.80 cm 1.14 cm/m   RA Area:     12.50 cm LA Vol (A2C):   35.1 ml 14.32 ml/m  RA Volume:   27.60 ml  11.26 ml/m LA Vol  (A4C):   27.5 ml 11.22 ml/m LA Biplane Vol: 31.7 ml 12.94 ml/m  AORTIC VALVE AV Area (Vmax):    2.35 cm AV Area (Vmean):   2.33 cm AV Area (VTI):     2.39 cm AV Vmax:           178.00 cm/s AV Vmean:          117.000 cm/s AV VTI:            0.259 m AV Peak Grad:      12.7 mmHg AV Mean Grad:      7.0 mmHg LVOT Vmax:         133.00 cm/s LVOT Vmean:        86.900 cm/s LVOT VTI:          0.197 m LVOT/AV VTI ratio: 0.76  AORTA Ao Root diam: 3.30 cm Ao Asc diam:  3.30 cm MITRAL VALVE                TRICUSPID VALVE MV Area (PHT): 4.17 cm     TR Peak grad:   29.4 mmHg MV Decel Time: 182 msec     TR Vmax:        271.00 cm/s MV E velocity: 101.00 cm/s MV A velocity: 101.00 cm/s  SHUNTS MV E/A ratio:  1.00         Systemic VTI:  0.20 m                             Systemic Diam: 2.00 cm Dalton McleanMD Electronically signed by Franki Monte Signature Date/Time: 06/23/2022/5:30:44 PM    Final    DG Chest Port 1 View  Result Date: 06/22/2022 CLINICAL DATA:  Weakness and fever. EXAM: PORTABLE CHEST 1 VIEW COMPARISON:  June 29, 2021 FINDINGS: A right-sided venous Port-A-Cath is seen with its distal tip noted at the junction of the superior vena cava and right atrium. The heart size  and mediastinal contours are within normal limits. Both lungs are clear. The visualized skeletal structures are unremarkable. IMPRESSION: No active disease. Electronically Signed   By: Virgina Norfolk M.D.   On: 06/22/2022 23:29     Discharge Instructions:     Discharge Instructions      Ms. [place],  It was a pleasure taking care of you at [place] were admitted for septic shock and had a brief stay in the ICU due to this. We did not find any evidence of infection so your low blood pressure and syncope was likely due to severe dehydration. We are discharging you home now that you are doing better. Please follow the following instructions.   1) For your hypotension, make sure to stay hydrated by drinking at least  2L of water daily  -Check your blood pressure daily, hold your lisinopril if the top number is less than 110 2) For your diabetes, A1c is now down to 5.7%. Great job.   -Decrease Lantus to 30 units daily at bedtime  -Decrease Humalog to 7 units 3 times daily with meals  -Check your blood sugar three times daily and follow-up next week for adjustments 3) For your endometrial cancer, continue your treatments and follow-up with the cancer doctors. Discuss with them about prescribing something to help with your appetite after your chemo sessions.   Take care,  Dr. Linwood Dibbles, MD, MPH      Signed: Lacinda Axon, MD 06/25/2022, 11:54 AM   Pager: [phone]

## 2022-06-27 ENCOUNTER — Other Ambulatory Visit: Payer: Self-pay

## 2022-06-27 ENCOUNTER — Ambulatory Visit
Admission: RE | Admit: 2022-06-27 | Discharge: 2022-06-27 | Disposition: A | Discharge status: Home / Self Care | Payer: Medicaid Other | Source: Ambulatory Visit | Attending: Radiation Oncology | Admitting: Radiation Oncology

## 2022-06-27 DIAGNOSIS — C541 Malignant neoplasm of endometrium: Secondary | ICD-10-CM

## 2022-06-27 LAB — RAD ONC ARIA SESSION SUMMARY
Course Elapsed Days: 14
Plan Fractions Treated to Date: 3
Plan Prescribed Dose Per Fraction: 6 Gy
Plan Total Fractions Prescribed: 5
Plan Total Prescribed Dose: 30 Gy
Reference Point Dosage Given to Date: 18 Gy
Reference Point Session Dosage Given: 6 Gy
Session Number: 3

## 2022-06-27 LAB — CULTURE, BLOOD (ROUTINE X 2)
Culture: NO GROWTH
Special Requests: ADEQUATE

## 2022-06-28 ENCOUNTER — Other Ambulatory Visit: Payer: Self-pay

## 2022-06-28 ENCOUNTER — Telehealth: Payer: Self-pay | Admitting: *Deleted

## 2022-06-28 NOTE — Telephone Encounter (Signed)
CALLED PATIENT TO INFORM THAT HDR Dale TO 8 AM ON 07-04-22, SPOKE WITH PATIENT AND SHE IS AWARE OF THIS CHANGE AND SHE IS GOOD WITH THIS CHANGE

## 2022-06-30 ENCOUNTER — Encounter (HOSPITAL_BASED_OUTPATIENT_CLINIC_OR_DEPARTMENT_OTHER): Payer: Medicaid Other | Attending: Internal Medicine | Admitting: Internal Medicine

## 2022-06-30 DIAGNOSIS — L84 Corns and callosities: Secondary | ICD-10-CM | POA: Diagnosis not present

## 2022-06-30 DIAGNOSIS — C541 Malignant neoplasm of endometrium: Secondary | ICD-10-CM | POA: Diagnosis not present

## 2022-06-30 DIAGNOSIS — E11621 Type 2 diabetes mellitus with foot ulcer: Secondary | ICD-10-CM | POA: Diagnosis present

## 2022-06-30 DIAGNOSIS — Z09 Encounter for follow-up examination after completed treatment for conditions other than malignant neoplasm: Secondary | ICD-10-CM | POA: Diagnosis not present

## 2022-06-30 DIAGNOSIS — L97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 exposed: Secondary | ICD-10-CM | POA: Insufficient documentation

## 2022-06-30 NOTE — Progress Notes (Signed)
[name], [name] ([id number]) Visit Report for 06/30/2022 Abuse Risk Screen Details Patient Name: Date of Service: [name], Debra A. 06/30/2022 9:45 A M Medical Record Number: [id number] Patient Account Number: [id number] Date of Birth/Sex: Treating RN: [birth year] (61 y.o. [name] Primary Care Tesa Meadors: Christiana Fuchs Other Clinician: Referring Akram Kissick: Treating Dellanira Dillow/Extender: Doran Stabler in Treatment: 0 Abuse Risk Screen Items Answer ABUSE RISK SCREEN: Has anyone close to you tried to hurt or harm you recentlyo No Do you feel uncomfortable with anyone in your familyo No Has anyone forced you do things that you didnt want to doo No Electronic Signature(s) Signed: 06/30/2022 4:21:29 PM By: Deon Pilling RN, BSN Signed: 06/30/2022 4:49:08 PM By: Erenest Blank Entered By: Erenest Blank on 06/30/2022 10:00:56 -------------------------------------------------------------------------------- Activities of Daily Living Details Patient Name: Date of Service: Debra Buba A. 06/30/2022 9:45 A M Medical Record Number: [id number] Patient Account Number: [id number] Date of Birth/Sex: Treating RN: Sep 17, 1961 (60 y.o. [name] Primary Care Renna Kilmer: Christiana Fuchs Other Clinician: Referring Jerick Khachatryan: Treating Mairen Wallenstein/Extender: Doran Stabler in Treatment: 0 Activities of Daily Living Items Answer Activities of Daily Living (Please select one for each item) Drive Automobile Not Able T Medications ake Completely Able Use T elephone Completely Able Care for Appearance Completely Able Use T oilet Completely Able Bath / Shower Completely Able Dress Self Completely Able Feed Self Completely Able Walk Completely Able Get In / Out Bed Completely Able Housework Completely Able Prepare Meals Completely Able Handle Money Completely Able Shop for Self Completely Able Electronic Signature(s) Signed: 06/30/2022 4:21:29 PM By:  Deon Pilling RN, BSN Signed: 06/30/2022 4:49:08 PM By: Erenest Blank Entered By: Erenest Blank on 06/30/2022 10:01:20 -------------------------------------------------------------------------------- Education Screening Details Patient Name: Date of Service: Debra Buba A. 06/30/2022 9:45 A M Medical Record Number: [id number] Patient Account Number: [id number] Date of Birth/Sex: Treating RN: 22-May-1961 (61 y.o. [name] Primary Care Nelida Mandarino: Christiana Fuchs Other Clinician: Referring Ameshia Pewitt: Treating Thanos Cousineau/Extender: Doran Stabler in Treatment: 0 Primary Learner Assessed: Patient Learning Preferences/Education Level/Primary Language Learning Preference: Explanation, Demonstration, Printed Material Highest Education Level: College or Above Preferred Language: English Cognitive Barrier Language Barrier: No Translator Needed: No Memory Deficit: No Emotional Barrier: No Cultural/Religious Beliefs Affecting Medical Care: No Physical Barrier Impaired Vision: Yes Glasses Impaired Hearing: No Decreased Hand dexterity: No Knowledge/Comprehension Knowledge Level: High Comprehension Level: High Ability to understand written instructions: High Ability to understand verbal instructions: High Motivation Anxiety Level: Calm Cooperation: Cooperative Education Importance: Acknowledges Need Interest in Health Problems: Asks Questions Perception: Coherent Willingness to Engage in Self-Management High Activities: Readiness to Engage in Self-Management High Activities: Electronic Signature(s) Signed: 06/30/2022 4:21:29 PM By: Deon Pilling RN, BSN Signed: 06/30/2022 4:49:08 PM By: Erenest Blank Entered By: Erenest Blank on 06/30/2022 10:04:14 -------------------------------------------------------------------------------- Fall Risk Assessment Details Patient Name: Date of Service: Debra Buba A. 06/30/2022 9:45 A M Medical Record Number:  [id number] Patient Account Number: [id number] Date of Birth/Sex: Treating RN: 17-Feb-1961 (61 y.o. [name] Primary Care Neenah Canter: Christiana Fuchs Other Clinician: Referring Amenda Duclos: Treating Ulyssa Walthour/Extender: Doran Stabler in Treatment: 0 Fall Risk Assessment Items Have you had 2 or more falls in the last 12 monthso 0 Yes Have you had any fall that resulted in injury in the last 12 monthso 0 No FALLS RISK SCREEN History of falling - immediate or within 3 months 25 Yes Secondary diagnosis (Do you have 2 or more medical diagnoseso)  15 Yes Ambulatory aid None/bed rest/wheelchair/nurse 0 Yes Crutches/cane/walker 0 No Furniture 0 No Intravenous therapy Access/Saline/Heparin Lock 0 No Gait/Transferring Normal/ bed rest/ wheelchair 0 No Weak (short steps with or without shuffle, stooped but able to lift head while walking, may seek 0 No support from furniture) Impaired (short steps with shuffle, may have difficulty arising from chair, head down, impaired 0 No balance) Mental Status Oriented to own ability 0 Yes Electronic Signature(s) Signed: 06/30/2022 4:21:29 PM By: Deon Pilling RN, BSN Signed: 06/30/2022 4:49:08 PM By: Erenest Blank Entered By: Erenest Blank on 06/30/2022 10:05:27 -------------------------------------------------------------------------------- Foot Assessment Details Patient Name: Date of Service: Debra Buba A. 06/30/2022 9:45 A M Medical Record Number: [id number] Patient Account Number: [id number] Date of Birth/Sex: Treating RN: 10/19/1961 (61 y.o. [name] Primary Care Zaelyn Barbary: Christiana Fuchs Other Clinician: Referring Kaison Mcparland: Treating Lenis Nettleton/Extender: Doran Stabler in Treatment: 0 Foot Assessment Items Site Locations + = Sensation present, - = Sensation absent, C = Callus, U = Ulcer R = Redness, W = Warmth, M = Maceration, PU = Pre-ulcerative lesion F = Fissure, S = Swelling, D =  Dryness Assessment Right: Left: Other Deformity: No No Prior Foot Ulcer: No No Prior Amputation: No No Charcot Joint: No No Ambulatory Status: Gait: Electronic Signature(s) Signed: 06/30/2022 4:21:29 PM By: Deon Pilling RN, BSN Signed: 06/30/2022 4:49:08 PM By: Erenest Blank Entered By: Erenest Blank on 06/30/2022 10:07:54 -------------------------------------------------------------------------------- Nutrition Risk Screening Details Patient Name: Date of Service: Debra Buba A. 06/30/2022 9:45 A M Medical Record Number: [id number] Patient Account Number: [id number] Date of Birth/Sex: Treating RN: November 03, 1961 (61 y.o. [name] Primary Care Alisyn Lequire: Christiana Fuchs Other Clinician: Referring Cordera Stineman: Treating Aubrie Lucien/Extender: Kalman Shan Masters, Katie Weeks in Treatment: 0 Height (in): Weight (lbs): Body Mass Index (BMI): Nutrition Risk Screening Items Score Screening NUTRITION RISK SCREEN: I have an illness or condition that made me change the kind and/or amount of food I eat 2 Yes I eat fewer than two meals per day 0 No I eat few fruits and vegetables, or milk products 0 No I have three or more drinks of beer, liquor or wine almost every day 0 No I have tooth or mouth problems that make it hard for me to eat 0 No I don't always have enough money to buy the food I need 0 No I eat alone most of the time 0 No I take three or more different prescribed or over-the-counter drugs a day 0 No Without wanting to, I have lost or gained 10 pounds in the last six months 2 Yes I am not always physically able to shop, cook and/or feed myself 0 No Nutrition Protocols Good Risk Protocol Moderate Risk Protocol 0 Provide education on nutrition High Risk Proctocol Risk Level: Moderate Risk Score: 4 Electronic Signature(s) Signed: 06/30/2022 4:21:29 PM By: Deon Pilling RN, BSN Signed: 06/30/2022 4:49:08 PM By: Erenest Blank Entered By: Erenest Blank on 06/30/2022  10:07:14

## 2022-06-30 NOTE — Progress Notes (Signed)
KAILEIA, FLOW ([id number]) Visit Report for 06/30/2022 Chief Complaint Document Details Patient Name: Date of Service: Debra, Rivera 06/30/2022 9:45 A M Medical Record Number: [id number] Patient Account Number: [id number] Date of Birth/Sex: Treating RN: [birth year] (61 y.o. [name] Primary Care Provider: Christiana Fuchs Other Clinician: Referring Provider: Treating Provider/Extender: Doran Stabler in Treatment: 0 Information Obtained from: Patient Chief Complaint 06/30/2022; right plantar foot wound Electronic Signature(s) Signed: 06/30/2022 4:15:08 PM By: Kalman Shan DO Entered By: Kalman Shan on 06/30/2022 16:07:00 -------------------------------------------------------------------------------- Debridement Details Patient Name: Date of Service: Debra Buba A. 06/30/2022 9:45 A M Medical Record Number: [id number] Patient Account Number: [id number] Date of Birth/Sex: Treating RN: 04/17/1961 (61 y.o. [name] Primary Care Provider: Christiana Fuchs Other Clinician: Referring Provider: Treating Provider/Extender: Doran Stabler in Treatment: 0 Debridement Performed for Assessment: Wound #16 Right,Plantar Foot Performed By: Physician Kalman Shan, DO Debridement Type: Debridement Severity of Tissue Pre Debridement: Fat layer exposed Level of Consciousness (Pre-procedure): Awake and Alert Pre-procedure Verification/Time Out Yes - 10:20 Taken: Start Time: 10:21 Pain Control: Lidocaine 5% topical ointment T Area Debrided (L x W): otal 2 (cm) x 2 (cm) = 4 (cm) Tissue and other material debrided: Viable, Callus, Subcutaneous, Skin: Dermis , Skin: Epidermis Level: Skin/Subcutaneous Tissue Debridement Description: Excisional Instrument: Curette, Forceps Bleeding: Minimum Hemostasis Achieved: Pressure End Time: 10:36 Procedural Pain: 0 Post Procedural Pain: 0 Response to Treatment: Procedure was  tolerated well Level of Consciousness (Post- Awake and Alert procedure): Post Debridement Measurements of Total Wound Length: (cm) 0.1 Width: (cm) 0.2 Depth: (cm) 0.2 Volume: (cm) 0.003 Character of Wound/Ulcer Post Debridement: Improved Severity of Tissue Post Debridement: Fat layer exposed Post Procedure Diagnosis Same as Pre-procedure Electronic Signature(s) Signed: 06/30/2022 4:15:08 PM By: Kalman Shan DO Signed: 06/30/2022 4:21:29 PM By: Deon Pilling RN, BSN Entered By: Deon Pilling on 06/30/2022 10:36:49 -------------------------------------------------------------------------------- HPI Details Patient Name: Date of Service: [name], Debra Boehringer A. 06/30/2022 9:45 A M Medical Record Number: [id number] Patient Account Number: [id number] Date of Birth/Sex: Treating RN: 1961-04-07 (61 y.o. [name] Primary Care Provider: Christiana Fuchs Other Clinician: Referring Provider: Treating Provider/Extender: Doran Stabler in Treatment: 0 History of Present Illness Location: notice some discharge and callus on the right big toe Quality: Patient reports experiencing a dull pain to affected area(s). Severity: Patient states wound(s) are getting worse. Duration: Patient has had the wound for < 2 weeks prior to presenting for treatment Timing: Pain in wound is Intermittent (comes and goes Context: The wound occurred when the patient was cutting her toenails and noticed some discharge from this area and was very concerned about it. Modifying Factors: Patient wound(s)/ulcer(s) are improving due to:he has seen her PCP who put her on Bactrim and Keflex antibiotic ssociated Signs and Symptoms: Patient reports having decrease discharge she started on her antibiotic. A HPI Description: [name] is a 61 year old female with a past medical history of uncontrolled type 2 diabetes on insulin, osteomyelitis with amputations of 1st and 2nd toes of her left foot  that presents to clinic for wound care to the right great toe wound. She states she noticed the wound 3 weeks ago. Not quite sure how it started. She has been using silver alginate to the area. She was recently hospitalized from a clinic visit in the internal medicine clinic for this issue. While hospitalized she had an MRI of the right foot that showed likely early acute osteomyelitis  with possible septic arthritis. She was discharged on ciprofloxacin for 28 days. She currently does not deny pain increased warmth or erythema to the area. She has some drainage but not purulent. 6/9; patient presents for 1 week follow-up. She has no complaints today and denies signs of infection. She missed her infectious disease appointment yesterday but has it rescheduled for the 21st. She has been using silver alginate to the wound. 6/23; patient presents for 2-week follow-up. She followed up with Dr. Megan Salon, infectious disease 2 days ago. She had some blood work done and was told to finish ciprofloxacin. She is having a follow-up virtual encounter next week. There has been an increase in callus development. She is unable to use her offloading shoe due to having to walk for long periods due to needing public transportation. She continues to use silver alginate to the wound. She denies signs of infection. 7/8; patient presents for follow-up. She missed her appointment last week. She reports increased callus to the surrounding wound area. She continues to use silver alginate. She denies signs of infection. 7/14; patient presents for 1 week follow-up. She has been using silver alginate to the wound bed. She has no issues or complaints today. She denies signs of infection. 7/28; patient presents for 2-week follow-up. She has been using silver alginate to the wound bed. She has been moving to a new location over the past week. She has no issues or complaints today. She denies signs of infection. 8/18; patient  presents for follow-up. She was recently hospitalized for COVID and has not been able to follow-up for wound care. She was last seen 3 weeks ago. She reports receiving wound care while hospitalized. Since discharge of the hospital she is reported more drainage from the wound site. She denies signs of infection. 8/25; patient presents for follow-up. She has no issues or complaints today. She has been using silver alginate daily to the wound bed. She denies signs of infection. 9/15; patient presents for follow-up. She missed her last clinic appointment. She reports using silver alginate to the wound bed. She denies signs of infection. Due to transportation she has to walk for long periods of time. It is hard for the patient to offload this area well. 9/22; patient presents for follow-up. She has been using silver alginate to the wound bed. She reports a new wound to the distal portion of her right great toe. She states she stopped this last week. She has started Keflex for a wound culture done at last clinic visit. Due to cost she is not able to use the gentamicin cream prescribed. She states she is going to try different pharmacy to see if she can obtain this 10/6; patient presents for follow-up. She has been using gentamicin cream and silver alginate to the wound beds. The most distal portion of the right great toe has healed. She has no issues or complaints today. She denies signs of infection. 10/20; patient presents for follow-up. She has been using gentamicin cream and silver alginate to the wound bed and reports that it has closed over in the past week with callus. She removed part of her right great toenail and is following up with podiatry for this issue. She currently denies signs of infection. 11/10; patient presents for follow-up. She reports no drainage in the past week. She has no issues or complaints today. She denies signs of infection. She has been keeping the toe covered. Readmission  07/27/2022 [name] is a 61 year old female with  a past medical history of insulin-dependent type 2 diabetes currently controlled and newly diagnosed endometrial cancer on chemotherapy and radiation that presents to the clinic for a wound to the plantar aspect of her right foot. She has a callus to this area however noticed bleeding about 1 week ago from the site. Since this has started she has been using gentamicin ointment to the area. She currently denies signs of infection. Electronic Signature(s) Signed: 06/30/2022 4:15:08 PM By: Kalman Shan DO Entered By: Kalman Shan on 06/30/2022 16:10:20 -------------------------------------------------------------------------------- Physical Exam Details Patient Name: Date of Service: Debra Buba A. 06/30/2022 9:45 A M Medical Record Number: [id number] Patient Account Number: [id number] Date of Birth/Sex: Treating RN: 11/25/1961 (61 y.o. [name] Primary Care Provider: Christiana Fuchs Other Clinician: Referring Provider: Treating Provider/Extender: Doran Stabler in Treatment: 0 Constitutional respirations regular, non-labored and within target range for patient.. Cardiovascular 2+ dorsalis pedis/posterior tibialis pulses. Psychiatric pleasant and cooperative. Notes Right plantar foot with callus and small open wound. Mild odor. Electronic Signature(s) Signed: 06/30/2022 4:15:08 PM By: Kalman Shan DO Entered By: Kalman Shan on 06/30/2022 16:11:09 -------------------------------------------------------------------------------- Physician Orders Details Patient Name: Date of Service: Debra Buba A. 06/30/2022 9:45 A M Medical Record Number: [id number] Patient Account Number: [id number] Date of Birth/Sex: Treating RN: Mar 17, 1961 (61 y.o. [name] Primary Care Provider: Christiana Fuchs Other Clinician: Referring Provider: Treating Provider/Extender: Doran Stabler in Treatment: 0 Verbal / Phone Orders: No Diagnosis Coding ICD-10 Coding Code Description L84 Corns and callosities E11.628 Type 2 diabetes mellitus with other skin complications Follow-up Appointments ppointment in 1 week. - Dr. Heber Forsan and [place], Room 8 0900 07/07/2022 Thursday Return A Other: - [facility] Referral for general diabetic foot care and orthotics or insoles. ***Pick up oral antibiotics at pharmacy.*** Off-Loading Other: - limit pressure to right foot. Wound Treatment Wound #16 - Foot Wound Laterality: Plantar, Right Cleanser: Soap and Water 1 x Per Day/30 Days Discharge Instructions: May shower and wash wound with dial antibacterial soap and water prior to dressing change. Cleanser: Wound Cleanser 1 x Per Day/30 Days Discharge Instructions: Cleanse the wound with wound cleanser prior to applying a clean dressing using gauze sponges, not tissue or cotton balls. Topical: Gentamicin 1 x Per Day/30 Days Discharge Instructions: As directed by physician Prim Dressing: KerraCel Ag Gelling Fiber Dressing, 2x2 in (silver alginate) 1 x Per Day/30 Days ary Discharge Instructions: Apply silver alginate to wound bed as instructed Secondary Dressing: Optifoam Non-Adhesive Dressing, 4x4 in 1 x Per Day/30 Days Discharge Instructions: Apply over primary dressing as directed. Secured With: 66M Medipore Public affairs consultant Surgical T 2x10 (in/yd) 1 x Per Day/30 Days ape Discharge Instructions: Secure with tape as directed. Consults Podiatry - [facility] Referral for general diabetic foot care and orthotics or insoles. ICD 10 code E11.628 - (ICD10 L84 - Corns and callosities) Patient Medications llergies: ibuprofen, aspirin, penicillin A Notifications Medication Indication Start End 06/30/2022 doxycycline hyclate DOSE 1 - oral 100 mg tablet - 1 tablet oral BID x 7 days Electronic Signature(s) Signed: 06/30/2022 4:15:08 PM By: Kalman Shan  DO Previous Signature: 06/30/2022 10:41:29 AM Version By: Kalman Shan DO Entered By: Kalman Shan on 06/30/2022 16:11:22 Prescription 06/30/2022 -------------------------------------------------------------------------------- Ula Lingo A. Kalman Shan DO Patient Name: Provider: [birth year] [id number] Date of Birth: NPI#: [id number] Sex: DEA #: [id number] Phone #: License #: Welcome Patient Address: [place] [phone]  [address] [place], [place] [address] [place], [place] [address] [phone] Allergies ibuprofen; aspirin; penicillin Provider's Orders Podiatry - ICD10: L84 - [facility] Referral for general diabetic foot care and orthotics or insoles. ICD 10 code E11.628 Hand Signature: Date(s): Electronic Signature(s) Signed: 06/30/2022 4:15:08 PM By: Kalman Shan DO Entered By: Kalman Shan on 06/30/2022 16:11:22 -------------------------------------------------------------------------------- Problem List Details Patient Name: Date of Service: Debra Buba A. 06/30/2022 9:45 A M Medical Record Number: [id number] Patient Account Number: [id number] Date of Birth/Sex: Treating RN: 10/12/1961 (61 y.o. [name] Primary Care Provider: Christiana Fuchs Other Clinician: Referring Provider: Treating Provider/Extender: Doran Stabler in Treatment: 0 Active Problems ICD-10 Encounter Code Description Active Date MDM Diagnosis E11.621 Type 2 diabetes mellitus with foot ulcer 06/30/2022 No Yes L97.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 exposed 06/30/2022 No Yes L84 Corns and callosities 06/30/2022 No Yes C54.1 Malignant neoplasm of endometrium 06/30/2022 No Yes Inactive Problems Resolved Problems Electronic Signature(s) Signed: 06/30/2022 4:15:08 PM By: Kalman Shan DO Entered By: Kalman Shan on 06/30/2022  13:02:22 -------------------------------------------------------------------------------- Progress Note/History and Physical Details Patient Name: Date of Service: Debra Buba A. 06/30/2022 9:45 A M Medical Record Number: [id number] Patient Account Number: [id number] Date of Birth/Sex: Treating RN: September 04, 1961 (61 y.o. [name] Primary Care Provider: Christiana Fuchs Other Clinician: Referring Provider: Treating Provider/Extender: Doran Stabler in Treatment: 0 Subjective Chief Complaint Information obtained from Patient 06/30/2022; right plantar foot wound History of Present Illness (HPI) The following HPI elements were documented for the patient's wound: Location: notice some discharge and callus on the right big toe Quality: Patient reports experiencing a dull pain to affected area(s). Severity: Patient states wound(s) are getting worse. Duration: Patient has had the wound for < 2 weeks prior to presenting for treatment Timing: Pain in wound is Intermittent (comes and goes Context: The wound occurred when the patient was cutting her toenails and noticed some discharge from this area and was very concerned about it. Modifying Factors: Patient wound(s)/ulcer(s) are improving due to:he has seen her PCP who put her on Bactrim and Keflex antibiotic Associated Signs and Symptoms: Patient reports having decrease discharge she started on her antibiotic. [name] is a 61 year old female with a past medical history of uncontrolled type 2 diabetes on insulin, osteomyelitis with amputations of 1st and 2nd toes of her left foot that presents to clinic for wound care to the right great toe wound. She states she noticed the wound 3 weeks ago. Not quite sure how it started. She has been using silver alginate to the area. She was recently hospitalized from a clinic visit in the internal medicine clinic for this issue. While hospitalized she had an MRI of the  right foot that showed likely early acute osteomyelitis with possible septic arthritis. She was discharged on ciprofloxacin for 28 days. She currently does not deny pain increased warmth or erythema to the area. She has some drainage but not purulent. 6/9; patient presents for 1 week follow-up. She has no complaints today and denies signs of infection. She missed her infectious disease appointment yesterday but has it rescheduled for the 21st. She has been using silver alginate to the wound. 6/23; patient presents for 2-week follow-up. She followed up with Dr. Megan Salon, infectious disease 2 days ago. She had some blood work done and was told to finish ciprofloxacin. She is having a follow-up virtual encounter  next week. There has been an increase in callus development. She is unable to use her offloading shoe due to having to walk for long periods due to needing public transportation. She continues to use silver alginate to the wound. She denies signs of infection. 7/8; patient presents for follow-up. She missed her appointment last week. She reports increased callus to the surrounding wound area. She continues to use silver alginate. She denies signs of infection. 7/14; patient presents for 1 week follow-up. She has been using silver alginate to the wound bed. She has no issues or complaints today. She denies signs of infection. 7/28; patient presents for 2-week follow-up. She has been using silver alginate to the wound bed. She has been moving to a new location over the past week. She has no issues or complaints today. She denies signs of infection. 8/18; patient presents for follow-up. She was recently hospitalized for COVID and has not been able to follow-up for wound care. She was last seen 3 weeks ago. She reports receiving wound care while hospitalized. Since discharge of the hospital she is reported more drainage from the wound site. She denies signs of infection. 8/25; patient presents for  follow-up. She has no issues or complaints today. She has been using silver alginate daily to the wound bed. She denies signs of infection. 9/15; patient presents for follow-up. She missed her last clinic appointment. She reports using silver alginate to the wound bed. She denies signs of infection. Due to transportation she has to walk for long periods of time. It is hard for the patient to offload this area well. 9/22; patient presents for follow-up. She has been using silver alginate to the wound bed. She reports a new wound to the distal portion of her right great toe. She states she stopped this last week. She has started Keflex for a wound culture done at last clinic visit. Due to cost she is not able to use the gentamicin cream prescribed. She states she is going to try different pharmacy to see if she can obtain this 10/6; patient presents for follow-up. She has been using gentamicin cream and silver alginate to the wound beds. The most distal portion of the right great toe has healed. She has no issues or complaints today. She denies signs of infection. 10/20; patient presents for follow-up. She has been using gentamicin cream and silver alginate to the wound bed and reports that it has closed over in the past week with callus. She removed part of her right great toenail and is following up with podiatry for this issue. She currently denies signs of infection. 11/10; patient presents for follow-up. She reports no drainage in the past week. She has no issues or complaints today. She denies signs of infection. She has been keeping the toe covered. Readmission 07/27/2022 [name] is a 61 year old female with a past medical history of insulin-dependent type 2 diabetes currently controlled and newly diagnosed endometrial cancer on chemotherapy and radiation that presents to the clinic for a wound to the plantar aspect of her right foot. She has a callus to this area however noticed  bleeding about 1 week ago from the site. Since this has started she has been using gentamicin ointment to the area. She currently denies signs of infection. Patient History Information obtained from Patient, Caregiver. Allergies penicillin (Severity: Mild, Reaction: unknown), ibuprofen (Severity: Moderate, Reaction: vomiting), aspirin (Severity: Moderate, Reaction: nausea/vomiting) Family History Diabetes - Father,Child, Hypertension - Mother,Father, No family history of Cancer,  Heart Disease, Hereditary Spherocytosis, Kidney Disease, Lung Disease, Seizures, Stroke, Thyroid Problems, Tuberculosis. Social History Never smoker, Marital Status - Divorced, Alcohol Use - Never, Drug Use - No History, Caffeine Use - Rarely - soda. Medical History Eyes Denies history of Cataracts, Glaucoma, Optic Neuritis Ear/Nose/Mouth/Throat Denies history of Chronic sinus problems/congestion, Middle ear problems Hematologic/Lymphatic Patient has history of Anemia Denies history of Hemophilia, Human Immunodeficiency Virus, Lymphedema, Sickle Cell Disease Respiratory Patient has history of Asthma Denies history of Aspiration, Chronic Obstructive Pulmonary Disease (COPD), Pneumothorax, Sleep Apnea, Tuberculosis Cardiovascular Patient has history of Hypertension Denies history of Angina, Arrhythmia, Congestive Heart Failure, Coronary Artery Disease, Deep Vein Thrombosis, Hypotension, Myocardial Infarction, Peripheral Arterial Disease, Peripheral Venous Disease, Phlebitis, Vasculitis Gastrointestinal Denies history of Cirrhosis , Colitis, Crohnoos, Hepatitis A, Hepatitis B, Hepatitis C Endocrine Patient has history of Type II Diabetes Denies history of Type I Diabetes Genitourinary Denies history of End Stage Renal Disease Immunological Denies history of Lupus Erythematosus, Raynaudoos, Scleroderma Integumentary (Skin) Denies history of History of Burn Musculoskeletal Patient has history of  Osteomyelitis - hx left great toe Denies history of Gout, Rheumatoid Arthritis, Osteoarthritis Neurologic Patient has history of Neuropathy Denies history of Dementia, Quadriplegia, Paraplegia, Seizure Disorder Oncologic Patient has history of Received Chemotherapy - currently, Received Radiation - 06/27/2022 Psychiatric Patient has history of Confinement Anxiety Denies history of Anorexia/bulimia Patient is treated with Insulin, Oral Agents. Blood sugar is tested. Blood sugar results noted at the following times: Lunch - 120-160. Hospitalization/Surgery History - left second toe amputation. - left great toe amputation. - c-section x4. - sepsis 06/25/2022 inpatient. - T hysterectomy otal 03/30/22. - port inserted. Medical A Surgical History Notes nd Gastrointestinal GERD Oncologic endometrial Cancer dx 03/2022 Review of Systems (ROS) Eyes Complains or has symptoms of Glasses / Contacts. Objective Constitutional respirations regular, non-labored and within target range for patient.. Vitals Time Taken: 9:57 AM, Temperature: 98.6 F, Pulse: 101 bpm, Respiratory Rate: 18 breaths/min, Blood Pressure: 137/88 mmHg. Cardiovascular 2+ dorsalis pedis/posterior tibialis pulses. Psychiatric pleasant and cooperative. General Notes: Right plantar foot with callus and small open wound. Mild odor. Integumentary (Hair, Skin) Wound #16 status is Open. Original cause of wound was Blister. The date acquired was: 06/16/2022. The wound is located on the [place]. The wound measures 0.1cm length x 0.2cm width x 0.2cm depth; 0.016cm^2 area and 0.003cm^3 volume. There is Fat Layer (Subcutaneous Tissue) exposed. There is no tunneling or undermining noted. There is a medium amount of serosanguineous drainage noted. The wound margin is distinct with the outline attached to the wound base. There is large (67-100%) red granulation within the wound bed. There is no necrotic tissue within the wound  bed. General Notes: callous periwound. Assessment Active Problems ICD-10 Type 2 diabetes mellitus with foot ulcer Non-pressure chronic ulcer of other part of right foot with fat layer exposed Corns and callosities Malignant neoplasm of endometrium Patient presents with a wound to the plantar aspect of the right foot. I debrided nonviable tissue. I recommended silver alginate with gentamicin to the wound bed daily. I recommended offloading with a foam donut pad. She would benefit from orthotics. We will send a referral to podiatry. Since she is a diabetic on chemotherapy and there is mild odor on exam. She is at high risk for infection and thus amputation. I recommended antibiotics. Follow-up in 1 week. Procedures Wound #16 Pre-procedure diagnosis of Wound #16 is a Diabetic Wound/Ulcer of the Lower Extremity located on the Right,Plantar Foot .Severity of Tissue Pre Debridement is:  Fat layer exposed. There was a Excisional Skin/Subcutaneous Tissue Debridement with a total area of 4 sq cm performed by Kalman Shan, DO. With the following instrument(s): Curette, and Forceps to remove Viable tissue/material. Material removed includes Callus, Subcutaneous Tissue, Skin: Dermis, and Skin: Epidermis after achieving pain control using Lidocaine 5% topical ointment. A time out was conducted at 10:20, prior to the start of the procedure. A Minimum amount of bleeding was controlled with Pressure. The procedure was tolerated well with a pain level of 0 throughout and a pain level of 0 following the procedure. Post Debridement Measurements: 0.1cm length x 0.2cm width x 0.2cm depth; 0.003cm^3 volume. Character of Wound/Ulcer Post Debridement is improved. Severity of Tissue Post Debridement is: Fat layer exposed. Post procedure Diagnosis Wound #16: Same as Pre-Procedure Plan Follow-up Appointments: Return Appointment in 1 week. - Dr. Heber [place] [facility] [place], Room 8 0900 07/07/2022 Thursday Other: - [facility] Referral for general diabetic foot care and orthotics or insoles. ***Pick up oral antibiotics at pharmacy.*** Off-Loading: Other: - limit pressure to right foot. Consults ordered were: Podiatry - [facility] Referral for general diabetic foot care and orthotics or insoles. ICD 10 code E11.628 The following medication(s) was prescribed: doxycycline hyclate oral 100 mg tablet 1 1 tablet oral BID x 7 days starting 06/30/2022 WOUND #16: - Foot Wound Laterality: Plantar, Right Cleanser: Soap and Water 1 x Per Day/30 Days Discharge Instructions: May shower and wash wound with dial antibacterial soap and water prior to dressing change. Cleanser: Wound Cleanser 1 x Per Day/30 Days Discharge Instructions: Cleanse the wound with wound cleanser prior to applying a clean dressing using gauze sponges, not tissue or cotton balls. Topical: Gentamicin 1 x Per Day/30 Days Discharge Instructions: As directed by physician Prim Dressing: KerraCel Ag Gelling Fiber Dressing, 2x2 in (silver alginate) 1 x Per Day/30 Days ary Discharge Instructions: Apply silver alginate to wound bed as instructed Secondary Dressing: Optifoam Non-Adhesive Dressing, 4x4 in 1 x Per Day/30 Days Discharge Instructions: Apply over primary dressing as directed. Secured With: 2M Medipore Public affairs consultant Surgical T 2x10 (in/yd) 1 x Per Day/30 Days ape Discharge Instructions: Secure with tape as directed. 1. In office sharp debridement 2. Gentamicin with silver alginate 3. Offloading felt pad 4. Follow-up in 1 week Electronic Signature(s) Signed: 06/30/2022 4:15:08 PM By: Kalman Shan DO Entered By: Kalman Shan on 06/30/2022 16:14:00 -------------------------------------------------------------------------------- HxROS Details Patient Name: Date of Service: [name], Debra Boehringer A. 06/30/2022 9:45 A M Medical Record Number: [id number] Patient Account Number: [id number] Date of Birth/Sex: Treating RN: Jan 18, 1961 (61 y.o.  [name] Primary Care Provider: Christiana Fuchs Other Clinician: Referring Provider: Treating Provider/Extender: Doran Stabler in Treatment: 0 Label Progress Note Print Version as History and Physical for this encounter Information Obtained From Patient Caregiver Eyes Complaints and Symptoms: Positive for: Glasses / Contacts Medical History: Negative for: Cataracts; Glaucoma; Optic Neuritis Ear/Nose/Mouth/Throat Medical History: Negative for: Chronic sinus problems/congestion; Middle ear problems Hematologic/Lymphatic Medical History: Positive for: Anemia Negative for: Hemophilia; Human Immunodeficiency Virus; Lymphedema; Sickle Cell Disease Respiratory Medical History: Positive for: Asthma Negative for: Aspiration; Chronic Obstructive Pulmonary Disease (COPD); Pneumothorax; Sleep Apnea; Tuberculosis Cardiovascular Medical History: Positive for: Hypertension Negative for: Angina; Arrhythmia; Congestive Heart Failure; Coronary Artery Disease; Deep Vein Thrombosis; Hypotension; Myocardial Infarction; Peripheral Arterial Disease; Peripheral Venous Disease; Phlebitis; Vasculitis Gastrointestinal Medical History: Negative for: Cirrhosis ; Colitis; Crohns; Hepatitis A; Hepatitis B; Hepatitis C Past Medical History Notes: GERD Endocrine Medical History: Positive  for: Type II Diabetes Negative for: Type I Diabetes Time with diabetes: 45 years Treated with: Insulin, Oral agents Blood sugar tested every day: Yes Tested : 2 times per day Blood sugar testing results: Lunch: 120-160 Genitourinary Medical History: Negative for: End Stage Renal Disease Immunological Medical History: Negative for: Lupus Erythematosus; Raynauds; Scleroderma Integumentary (Skin) Medical History: Negative for: History of Burn Musculoskeletal Medical History: Positive for: Osteomyelitis - hx left great toe Negative for: Gout; Rheumatoid Arthritis;  Osteoarthritis Neurologic Medical History: Positive for: Neuropathy Negative for: Dementia; Quadriplegia; Paraplegia; Seizure Disorder Oncologic Medical History: Positive for: Received Chemotherapy - currently; Received Radiation - 06/27/2022 Past Medical History Notes: endometrial Cancer dx 03/2022 Psychiatric Medical History: Positive for: Confinement Anxiety Negative for: Anorexia/bulimia Immunizations Pneumococcal Vaccine: Received Pneumococcal Vaccination: Yes Received Pneumococcal Vaccination On or After 60th Birthday: No Immunization Notes: pt. 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 c-section x4 sepsis 06/25/2022 inpatient T hysterectomy 03/30/22 otal port inserted Family and Social History Cancer: No; Diabetes: Yes - Father,Child; Heart Disease: No; Hereditary Spherocytosis: No; Hypertension: Yes - Mother,Father; Kidney Disease: No; Lung Disease: No; Seizures: No; Stroke: No; Thyroid Problems: No; Tuberculosis: No; Never smoker; Marital Status - Divorced; Alcohol Use: Never; Drug Use: No History; Caffeine Use: Rarely - soda; Financial Concerns: No; Food, Clothing or Shelter Needs: No; Support System Lacking: No; Transportation Concerns: No Electronic Signature(s) Signed: 06/30/2022 4:15:08 PM By: Kalman Shan DO Signed: 06/30/2022 4:21:29 PM By: Deon Pilling RN, BSN Signed: 06/30/2022 4:49:08 PM By: Erenest Blank Entered By: Erenest Blank on 06/30/2022 09:57:11 -------------------------------------------------------------------------------- SuperBill Details Patient Name: Date of Service: Debra Buba A. 06/30/2022 Medical Record Number: [id number] Patient Account Number: [id number] Date of Birth/Sex: Treating RN: 1961/07/18 (62 y.o. [name] Primary Care Provider: Christiana Fuchs Other Clinician: Referring Provider: Treating  Provider/Extender: Doran Stabler in Treatment: 0 Diagnosis Coding ICD-10 Codes Code Description [phone] Type 2 diabetes mellitus with foot ulcer L97.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 exposed L84 Corns and callosities C54.1 Malignant neoplasm of endometrium Facility Procedures CPT4 Code: [id number] Description: [place] VISIT-LEV 3 EST PT Modifier: Quantity: 1 CPT4 Code: [id number] Description: [place] - DEB SUBQ TISSUE 20 SQ CM/< ICD-10 Diagnosis Description L97.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 expose Modifier: d Quantity: 1 Physician Procedures : CPT4 Code Description Modifier [id number] - WC PHYS LEVEL 4 - EST PT ICD-10 Diagnosis Description E11.621 Type 2 diabetes mellitus with foot ulcer L97.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 exposed L84 Corns and  callosities 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

## 2022-06-30 NOTE — Progress Notes (Signed)
ELOIS, AVERITT ([id number]) Visit Report for 06/30/2022 Allergy List Details Patient Name: Date of Service: [name], Debra A. 06/30/2022 9:45 A M Medical Record Number: [id number] Patient Account Number: [id number] Date of Birth/Sex: Treating RN: 1961/10/10 (61 y.o. [name] Primary Care Tressa Maldonado: Christiana Fuchs Other Clinician: Referring Doll Frazee: Treating Caedence Snowden/Extender: Kalman Shan Masters, Katie Weeks in Treatment: 0 Allergies Active Allergies penicillin Reaction: unknown Severity: Mild ibuprofen Reaction: vomiting Severity: Moderate aspirin Reaction: nausea/vomiting Severity: Moderate Allergy Notes Electronic Signature(s) Signed: 06/30/2022 4:49:08 PM By: Erenest Blank Entered By: Erenest Blank on 06/30/2022 09:44:18 -------------------------------------------------------------------------------- Arrival Information Details Patient Name: Date of Service: Debra Buba A. 06/30/2022 9:45 A M Medical Record Number: [id number] Patient Account Number: [id number] Date of Birth/Sex: Treating RN: 07-Feb-1961 (61 y.o. Helene Shoe, Meta.Reding Primary Care Kaylise Blakeley: Christiana Fuchs Other Clinician: Referring Terrianne Cavness: Treating Ramie Erman/Extender: Doran Stabler in Treatment: 0 Visit Information Patient Arrived: Ambulatory Arrival Time: 09:41 Accompanied By: self Transfer Assistance: None Patient Identification Verified: Yes Secondary Verification Process Completed: Yes Patient Requires Transmission-Based Precautions: No Patient Has Alerts: No History Since Last Visit Added or deleted any medications: Yes Any new allergies or adverse reactions: No Had a fall or experienced change in activities of daily living that may affect risk of falls: No Signs or symptoms of abuse/neglect since last visito No Hospitalized since last visit: Yes Implantable device outside of the clinic excluding cellular tissue based products placed in the center since  last visit: No Pain Present Now: No Electronic Signature(s) Signed: 06/30/2022 4:49:08 PM By: Erenest Blank Signed: 06/30/2022 4:49:08 PM By: Erenest Blank Entered By: Erenest Blank on 06/30/2022 09:43:21 -------------------------------------------------------------------------------- Clinic Level of Care Assessment Details Patient Name: Date of Service: Debra Buba A. 06/30/2022 9:45 A M Medical Record Number: [id number] Patient Account Number: [id number] Date of Birth/Sex: Treating RN: 1961-08-19 (61 y.o. [name] Primary Care Freeland Pracht: Christiana Fuchs Other Clinician: Referring Jennaya Pogue: Treating Francy Mcilvaine/Extender: Doran Stabler in Treatment: 0 Clinic Level of Care Assessment Items TOOL 1 Quantity Score X- 1 0 Use when EandM and Procedure is performed on INITIAL visit ASSESSMENTS - Nursing Assessment / Reassessment X- 1 20 General Physical Exam (combine w/ comprehensive assessment (listed just below) when performed on new pt. evals) X- 1 25 Comprehensive Assessment (HX, ROS, Risk Assessments, Wounds Hx, etc.) ASSESSMENTS - Wound and Skin Assessment / Reassessment X- 1 10 Dermatologic / Skin Assessment (not related to wound area) ASSESSMENTS - Ostomy and/or Continence Assessment and Care '[]'$  - 0 Incontinence Assessment and Management '[]'$  - 0 Ostomy Care Assessment and Management (repouching, etc.) PROCESS - Coordination of Care X - Simple Patient / Family Education for ongoing care 1 15 '[]'$  - 0 Complex (extensive) Patient / Family Education for ongoing care X- 1 10 Staff obtains Programmer, systems, Records, T Results / Process Orders est '[]'$  - 0 Staff telephones HHA, Nursing Homes / Clarify orders / etc '[]'$  - 0 Routine Transfer to another Facility (non-emergent condition) '[]'$  - 0 Routine Hospital Admission (non-emergent condition) X- 1 15 New Admissions / Biomedical engineer / Ordering NPWT Apligraf, etc. , '[]'$  - 0 Emergency Hospital Admission  (emergent condition) PROCESS - Special Needs '[]'$  - 0 Pediatric / Minor Patient Management '[]'$  - 0 Isolation Patient Management '[]'$  - 0 Hearing / Language / Visual special needs '[]'$  - 0 Assessment of Community assistance (transportation, D/C planning, etc.) '[]'$  - 0 Additional assistance / Altered mentation '[]'$  - 0 Support Surface(s) Assessment (bed, cushion, seat, etc.)  INTERVENTIONS - Miscellaneous '[]'$  - 0 External ear exam '[]'$  - 0 Patient Transfer (multiple staff / Civil Service fast streamer / Similar devices) '[]'$  - 0 Simple Staple / Suture removal (25 or less) '[]'$  - 0 Complex Staple / Suture removal (26 or more) '[]'$  - 0 Hypo/Hyperglycemic Management (do not check if billed separately) '[]'$  - 0 Ankle / Brachial Index (ABI) - do not check if billed separately Has the patient been seen at the hospital within the last three years: Yes Total Score: 95 Level Of Care: New/Established - Level 3 Electronic Signature(s) Signed: 06/30/2022 4:21:29 PM By: Deon Pilling RN, BSN Entered By: Deon Pilling on 06/30/2022 10:31:48 -------------------------------------------------------------------------------- Encounter Discharge Information Details Patient Name: Date of Service: [name], Debra Boehringer A. 06/30/2022 9:45 A M Medical Record Number: [id number] Patient Account Number: [id number] Date of Birth/Sex: Treating RN: 1961-11-08 (61 y.o. [name] Primary Care Eleora Sutherland: Christiana Fuchs Other Clinician: Referring Kathaleen Dudziak: Treating Klye Besecker/Extender: Doran Stabler in Treatment: 0 Encounter Discharge Information Items Post Procedure Vitals Discharge Condition: Stable Temperature (F): 98.6 Ambulatory Status: Ambulatory Pulse (bpm): 101 Discharge Destination: Home Respiratory Rate (breaths/min): 20 Transportation: Private Auto Blood Pressure (mmHg): 137/88 Accompanied By: self Schedule Follow-up Appointment: Yes Clinical Summary of Care: Electronic Signature(s) Signed: 06/30/2022  4:21:29 PM By: Deon Pilling RN, BSN Entered By: Deon Pilling on 06/30/2022 10:40:36 -------------------------------------------------------------------------------- Lower Extremity Assessment Details Patient Name: Date of Service: Debra Buba A. 06/30/2022 9:45 A M Medical Record Number: [id number] Patient Account Number: [id number] Date of Birth/Sex: Treating RN: [birth year] (61 y.o. [name] Primary Care Keyosha Tiedt: Christiana Fuchs Other Clinician: Referring Amirrah Quigley: Treating Barth Trella/Extender: Doran Stabler in Treatment: 0 Edema Assessment Assessed: [Left: No] [Right: Yes] E[Left: dema] [Right: :] Calf Left: Right: Point of Measurement: 49 cm From Medial Instep 38.5 cm Ankle Left: Right: Point of Measurement: 36 cm From Medial Instep 22 cm Knee To Floor Left: Right: From Medial Instep 12 cm Vascular Assessment Pulses: Dorsalis Pedis Palpable: [Right:Yes] Electronic Signature(s) Signed: 06/30/2022 4:21:29 PM By: Deon Pilling RN, BSN Signed: 06/30/2022 4:49:08 PM By: Erenest Blank Entered By: Erenest Blank on 06/30/2022 10:06:20 -------------------------------------------------------------------------------- Multi Wound Chart Details Patient Name: Date of Service: Debra Buba A. 06/30/2022 9:45 A M Medical Record Number: [id number] Patient Account Number: [id number] Date of Birth/Sex: Treating RN: February 06, 1961 (61 y.o. [name] Primary Care Shaleah Nissley: Christiana Fuchs Other Clinician: Referring Danitra Payano: Treating Geralynn Capri/Extender: Kalman Shan Masters, Katie Weeks in Treatment: 0 Vital Signs Height(in): Pulse(bpm): 101 Weight(lbs): Blood Pressure(mmHg): 137/88 Body Mass Index(BMI): Temperature(F): 98.6 Respiratory Rate(breaths/min): 18 Photos: [N/A:N/A] Right, Plantar Foot N/A N/A Wound Location: Blister N/A N/A Wounding Event: Diabetic Wound/Ulcer of the Lower N/A N/A Primary Etiology: Extremity Anemia,  Asthma, Hypertension, Type N/A N/A Comorbid History: II Diabetes, Osteomyelitis, Neuropathy, Received Chemotherapy, Received Radiation, Confinement Anxiety 06/16/2022 N/A N/A Date Acquired: 0 N/A N/A Weeks of Treatment: Open N/A N/A Wound Status: No N/A N/A Wound Recurrence: 0.1x0.2x0.2 N/A N/A Measurements L x W x D (cm) 0.016 N/A N/A A (cm) : rea 0.003 N/A N/A Volume (cm) : 0.00% N/A N/A % Reduction in A rea: 0.00% N/A N/A % Reduction in Volume: Grade 2 N/A N/A Classification: Medium N/A N/A Exudate A mount: Serosanguineous N/A N/A Exudate Type: red, brown N/A N/A Exudate Color: Distinct, outline attached N/A N/A Wound Margin: Large (67-100%) N/A N/A Granulation A mount: Red N/A N/A Granulation Quality: None Present (0%) N/A N/A Necrotic A mount: Fat Layer (Subcutaneous Tissue): Yes N/A N/A  Exposed Structures: Fascia: No Tendon: No Muscle: No Joint: No Bone: No Debridement - Excisional N/A N/A Debridement: Pre-procedure Verification/Time Out 10:20 N/A N/A Taken: Lidocaine 5% topical ointment N/A N/A Pain Control: Callus, Subcutaneous N/A N/A Tissue Debrided: Skin/Subcutaneous Tissue N/A N/A Level: 4 N/A N/A Debridement A (sq cm): rea Curette, Forceps N/A N/A Instrument: Minimum N/A N/A Bleeding: Pressure N/A N/A Hemostasis A chieved: 0 N/A N/A Procedural Pain: 0 N/A N/A Post Procedural Pain: Procedure was tolerated well N/A N/A Debridement Treatment Response: 0.1x0.2x0.2 N/A N/A Post Debridement Measurements L x W x D (cm) 0.003 N/A N/A Post Debridement Volume: (cm) callous periwound. N/A N/A Assessment Notes: Debridement N/A N/A Procedures Performed: Treatment Notes Wound #16 (Foot) Wound Laterality: Plantar, Right Cleanser Soap and Water Discharge Instruction: May shower and wash wound with dial antibacterial soap and water prior to dressing change. Wound Cleanser Discharge Instruction: Cleanse the wound with wound cleanser  prior to applying a clean dressing using gauze sponges, not tissue or cotton balls. Peri-Wound Care Topical Gentamicin Discharge Instruction: As directed by physician Primary Dressing KerraCel Ag Gelling Fiber Dressing, 2x2 in (silver alginate) Discharge Instruction: Apply silver alginate to wound bed as instructed Secondary Dressing Optifoam Non-Adhesive Dressing, 4x4 in Discharge Instruction: Apply over primary dressing as directed. Secured With [employer] Surgical T 2x10 (in/yd) ape Discharge Instruction: Secure with tape as directed. Compression Wrap Compression Stockings Add-Ons Electronic Signature(s) Signed: 06/30/2022 4:15:08 PM By: Kalman Shan DO Signed: 06/30/2022 4:21:29 PM By: Deon Pilling RN, BSN Entered By: Kalman Shan on 06/30/2022 13:20:00 -------------------------------------------------------------------------------- Multi-Disciplinary Care Plan Details Patient Name: Date of Service: Debra Buba A. 06/30/2022 9:45 A M Medical Record Number: [id number] Patient Account Number: [id number] Date of Birth/Sex: Treating RN: 02-28-1961 (61 y.o. [name] Primary Care Rubylee Zamarripa: Christiana Fuchs Other Clinician: Referring Tarvaris Puglia: Treating Ganesh Deeg/Extender: Doran Stabler in Treatment: 0 Active Inactive Orientation to the Wound Care Program Nursing Diagnoses: Knowledge deficit related to the wound healing center program Goals: Patient/caregiver will verbalize understanding of the [facility] Program Date Initiated: 06/30/2022 Target Resolution Date: 08/04/2022 Goal Status: Active Interventions: Provide education on orientation to the wound center Notes: Electronic Signature(s) Signed: 06/30/2022 4:21:29 PM By: Deon Pilling RN, BSN Entered By: Deon Pilling on 06/30/2022 10:13:21 -------------------------------------------------------------------------------- Pain Assessment Details Patient Name: Date  of Service: Debra Buba A. 06/30/2022 9:45 A M Medical Record Number: [id number] Patient Account Number: [id number] Date of Birth/Sex: Treating RN: 29-Apr-1961 (61 y.o. [name] Primary Care Bao Bazen: Christiana Fuchs Other Clinician: Referring Oscar Hank: Treating Besnik Febus/Extender: Doran Stabler in Treatment: 0 Active Problems Location of Pain Severity and Description of Pain Patient Has Paino No Site Locations Pain Management and Medication Current Pain Management: Electronic Signature(s) Signed: 06/30/2022 4:21:29 PM By: Deon Pilling RN, BSN Signed: 06/30/2022 4:49:08 PM By: Erenest Blank Entered By: Erenest Blank on 06/30/2022 10:08:35 -------------------------------------------------------------------------------- Patient/Caregiver Education Details Patient Name: Date of Service: [name] 8/3/2023andnbsp9:45 [place] Record Number: [id number] Patient Account Number: [id number] Date of Birth/Gender: Treating RN: Jun 21, 1961 (61 y.o. [name] Primary Care Physician: Christiana Fuchs Other Clinician: Referring Physician: Treating Physician/Extender: Doran Stabler in Treatment: 0 Education Assessment Education Provided To: Patient Education Topics Provided Welcome T [facility]: o Handouts: Welcome T [facility] o Methods: Explain/Verbal Responses: Reinforcements needed Electronic Signature(s) Signed: 06/30/2022 4:21:29 PM By: Deon Pilling RN, BSN Entered By: Deon Pilling on 06/30/2022 10:13:35 --------------------------------------------------------------------------------  Wound Assessment Details Patient Name: Date of Service: [name], Debra A. 06/30/2022 9:45 A M Medical Record Number: [id number] Patient Account Number: [id number] Date of Birth/Sex: Treating RN: Jan 18, 1961 (61 y.o. [name] Primary Care Lamyia Cdebaca: Christiana Fuchs Other Clinician: Referring  Rebie Peale: Treating Silas Muff/Extender: Kalman Shan Masters, Katie Weeks in Treatment: 0 Wound Status Wound Number: 16 Primary Diabetic Wound/Ulcer of the Lower Extremity Etiology: Wound Location: Right, Plantar Foot Wound Open Wounding Event: Blister Status: Date Acquired: 06/16/2022 Comorbid Anemia, Asthma, Hypertension, Type II Diabetes, Osteomyelitis, Weeks Of Treatment: 0 History: Neuropathy, Received Chemotherapy, Received Radiation, Clustered Wound: No Confinement Anxiety Photos Wound Measurements Length: (cm) 0.1 Width: (cm) 0.2 Depth: (cm) 0.2 Area: (cm) 0.016 Volume: (cm) 0.003 % Reduction in Area: 0% % Reduction in Volume: 0% Tunneling: No Undermining: No Wound Description Classification: Grade 2 Wound Margin: Distinct, outline attached Exudate Amount: Medium Exudate Type: Serosanguineous Exudate Color: red, brown Foul Odor After Cleansing: No Slough/Fibrino No Wound Bed Granulation Amount: Large (67-100%) Exposed Structure Granulation Quality: Red Fascia Exposed: No Necrotic Amount: None Present (0%) Fat Layer (Subcutaneous Tissue) Exposed: Yes Tendon Exposed: No Muscle Exposed: No Joint Exposed: No Bone Exposed: No Assessment Notes callous periwound. Treatment Notes Wound #16 (Foot) Wound Laterality: Plantar, Right Cleanser Soap and Water Discharge Instruction: May shower and wash wound with dial antibacterial soap and water prior to dressing change. Wound Cleanser Discharge Instruction: Cleanse the wound with wound cleanser prior to applying a clean dressing using gauze sponges, not tissue or cotton balls. Peri-Wound Care Topical Gentamicin Discharge Instruction: As directed by physician Primary Dressing KerraCel Ag Gelling Fiber Dressing, 2x2 in (silver alginate) Discharge Instruction: Apply silver alginate to wound bed as instructed Secondary Dressing Optifoam Non-Adhesive Dressing, 4x4 in Discharge Instruction: Apply over primary  dressing as directed. Secured With [employer] Surgical T 2x10 (in/yd) ape Discharge Instruction: Secure with tape as directed. Compression Wrap Compression Stockings Add-Ons Electronic Signature(s) Signed: 06/30/2022 4:21:29 PM By: Deon Pilling RN, BSN Entered By: Deon Pilling on 06/30/2022 10:42:28 -------------------------------------------------------------------------------- Vitals Details Patient Name: Date of Service: [name], Debra Boehringer A. 06/30/2022 9:45 A M Medical Record Number: [id number] Patient Account Number: [id number] Date of Birth/Sex: Treating RN: 1960-12-07 (61 y.o. [name] Primary Care Caley Volkert: Christiana Fuchs Other Clinician: Referring Garet Hooton: Treating Naisha Wisdom/Extender: Doran Stabler in Treatment: 0 Vital Signs Time Taken: 09:57 Temperature (F): 98.6 Pulse (bpm): 101 Respiratory Rate (breaths/min): 18 Blood Pressure (mmHg): 137/88 Reference Range: 80 - 120 mg / dl Electronic Signature(s) Signed: 06/30/2022 4:49:08 PM By: Erenest Blank Entered By: Erenest Blank on 06/30/2022 09:57:46

## 2022-07-01 ENCOUNTER — Telehealth: Payer: Self-pay | Admitting: *Deleted

## 2022-07-01 MED FILL — Fosaprepitant Dimeglumine For IV Infusion 150 MG (Base Eq): INTRAVENOUS | Qty: 5 | Status: AC

## 2022-07-01 MED FILL — Dexamethasone Sodium Phosphate Inj 100 MG/10ML: INTRAMUSCULAR | Qty: 1 | Status: AC

## 2022-07-01 NOTE — Telephone Encounter (Signed)
CALLED PATIENT TO REMIND OF HDR [name] 07-04-22 @ 8 AM, SPOKE WITH PATIENT AND SHE IS AWARE OF THIS [name].

## 2022-07-04 ENCOUNTER — Telehealth: Payer: Self-pay | Admitting: *Deleted

## 2022-07-04 ENCOUNTER — Other Ambulatory Visit: Payer: Self-pay

## 2022-07-04 ENCOUNTER — Ambulatory Visit
Admission: RE | Admit: 2022-07-04 | Discharge: 2022-07-04 | Disposition: A | Discharge status: Home / Self Care | Payer: Medicaid Other | Source: Ambulatory Visit | Attending: Radiation Oncology | Admitting: Radiation Oncology

## 2022-07-04 ENCOUNTER — Inpatient Hospital Stay: Payer: Medicaid Other

## 2022-07-04 ENCOUNTER — Telehealth: Payer: Self-pay | Admitting: Oncology

## 2022-07-04 ENCOUNTER — Telehealth: Payer: Self-pay | Admitting: Hematology and Oncology

## 2022-07-04 ENCOUNTER — Inpatient Hospital Stay: Payer: Medicaid Other | Attending: Gynecologic Oncology | Admitting: Hematology and Oncology

## 2022-07-04 DIAGNOSIS — Z452 Encounter for adjustment and management of vascular access device: Secondary | ICD-10-CM | POA: Insufficient documentation

## 2022-07-04 DIAGNOSIS — C541 Malignant neoplasm of endometrium: Secondary | ICD-10-CM | POA: Diagnosis present

## 2022-07-04 DIAGNOSIS — D61818 Other pancytopenia: Secondary | ICD-10-CM | POA: Diagnosis not present

## 2022-07-04 DIAGNOSIS — E1142 Type 2 diabetes mellitus with diabetic polyneuropathy: Secondary | ICD-10-CM | POA: Diagnosis not present

## 2022-07-04 DIAGNOSIS — Z5111 Encounter for antineoplastic chemotherapy: Secondary | ICD-10-CM | POA: Diagnosis present

## 2022-07-04 DIAGNOSIS — E669 Obesity, unspecified: Secondary | ICD-10-CM | POA: Insufficient documentation

## 2022-07-04 DIAGNOSIS — E1165 Type 2 diabetes mellitus with hyperglycemia: Secondary | ICD-10-CM | POA: Insufficient documentation

## 2022-07-04 DIAGNOSIS — Z6830 Body mass index (BMI) 30.0-30.9, adult: Secondary | ICD-10-CM | POA: Insufficient documentation

## 2022-07-04 DIAGNOSIS — D6481 Anemia due to antineoplastic chemotherapy: Secondary | ICD-10-CM | POA: Diagnosis not present

## 2022-07-04 LAB — CMP (CANCER CENTER ONLY)
ALT: 7 U/L (ref 0–44)
AST: 9 U/L — ABNORMAL LOW (ref 15–41)
Albumin: 4 g/dL (ref 3.5–5.0)
Alkaline Phosphatase: 88 U/L (ref 38–126)
Anion gap: 9 (ref 5–15)
BUN: 18 mg/dL (ref 6–20)
CO2: 24 mmol/L (ref 22–32)
Calcium: 9.3 mg/dL (ref 8.9–10.3)
Chloride: 101 mmol/L (ref 98–111)
Creatinine: 0.93 mg/dL (ref 0.44–1.00)
GFR, Estimated: >60 mL/min (ref >60)
Glucose, Bld: 271 mg/dL — ABNORMAL HIGH (ref 70–99)
Potassium: 4.7 mmol/L (ref 3.5–5.1)
Sodium: 134 mmol/L — ABNORMAL LOW (ref 135–145)
Total Bilirubin: 0.3 mg/dL (ref 0.3–1.2)
Total Protein: 8.3 g/dL — ABNORMAL HIGH (ref 6.5–8.1)

## 2022-07-04 LAB — CBC WITH DIFFERENTIAL (CANCER CENTER ONLY)
Abs Immature Granulocytes: 0.02 10*3/uL (ref 0.00–0.07)
Basophils Absolute: 0 10*3/uL (ref 0.0–0.1)
Basophils Relative: 0 %
Eosinophils Absolute: 0 10*3/uL (ref 0.0–0.5)
Eosinophils Relative: 0 %
HCT: 29.8 % — ABNORMAL LOW (ref 36.0–46.0)
Hemoglobin: 9.8 g/dL — ABNORMAL LOW (ref 12.0–15.0)
Immature Granulocytes: 0 %
Lymphocytes Relative: 12 %
Lymphs Abs: 0.8 10*3/uL (ref 0.7–4.0)
MCH: 30.8 pg (ref 26.0–34.0)
MCHC: 32.9 g/dL (ref 30.0–36.0)
MCV: 93.7 fL (ref 80.0–100.0)
Monocytes Absolute: 0 10*3/uL — ABNORMAL LOW (ref 0.1–1.0)
Monocytes Relative: 1 %
Neutro Abs: 6.2 10*3/uL (ref 1.7–7.7)
Neutrophils Relative %: 87 %
Platelet Count: 359 10*3/uL (ref 150–400)
RBC: 3.18 MIL/uL — ABNORMAL LOW (ref 3.87–5.11)
RDW: 14.9 % (ref 11.5–15.5)
WBC Count: 7.1 10*3/uL (ref 4.0–10.5)
nRBC: 0 % (ref 0.0–0.2)

## 2022-07-04 MED ORDER — SODIUM CHLORIDE 0.9% FLUSH
10.0000 mL | Freq: Once | INTRAVENOUS | Status: AC
  Administered 2022-07-04: 10 mL

## 2022-07-04 MED ORDER — HEPARIN SOD (PORK) LOCK FLUSH 100 UNIT/ML IV SOLN
500.0000 [IU] | Freq: Once | INTRAVENOUS | Status: AC
  Administered 2022-07-04: 500 [IU]

## 2022-07-04 NOTE — Telephone Encounter (Signed)
CALLED PATIENT TO INFORM THAT HDR TX. HAS BEEN MOVED TO 07-07-22 @ 11 AM, PATIENT AGREED TO NEW DAY AND TIME

## 2022-07-04 NOTE — Telephone Encounter (Signed)
Called [name] regarding radiation appointment this morning.  She said she thought it was at 9:00.  Advised we will need to reschedule because Dr. Sondra Come has another treatment at that time.  She verbalized understanding and knows that we will be calling to reschedule.

## 2022-07-04 NOTE — Telephone Encounter (Signed)
Called patient to inform of HDR Tx. for 07-07-22 @ 11 am, spoke with patient and she is aware of this tx.

## 2022-07-04 NOTE — Telephone Encounter (Signed)
Scheduled next available per 8/7 in basket, attempted to call pt but voicemail not set up, will try to call again

## 2022-07-04 NOTE — Telephone Encounter (Signed)
Called patient to inform of HDR Tx. for 07-07-22 @ 11 am, unable to leave message, will call later

## 2022-07-05 ENCOUNTER — Ambulatory Visit (INDEPENDENT_AMBULATORY_CARE_PROVIDER_SITE_OTHER): Payer: Medicaid Other | Admitting: Student

## 2022-07-05 ENCOUNTER — Encounter: Payer: Self-pay | Admitting: Student

## 2022-07-05 DIAGNOSIS — Z7985 Long-term (current) use of injectable non-insulin antidiabetic drugs: Secondary | ICD-10-CM

## 2022-07-05 DIAGNOSIS — L84 Corns and callosities: Secondary | ICD-10-CM | POA: Diagnosis not present

## 2022-07-05 DIAGNOSIS — Z7984 Long term (current) use of oral hypoglycemic drugs: Secondary | ICD-10-CM

## 2022-07-05 DIAGNOSIS — C541 Malignant neoplasm of endometrium: Secondary | ICD-10-CM

## 2022-07-05 DIAGNOSIS — Z794 Long term (current) use of insulin: Secondary | ICD-10-CM

## 2022-07-05 DIAGNOSIS — E1140 Type 2 diabetes mellitus with diabetic neuropathy, unspecified: Secondary | ICD-10-CM

## 2022-07-05 DIAGNOSIS — R55 Syncope and collapse: Secondary | ICD-10-CM | POA: Diagnosis present

## 2022-07-05 MED ORDER — BLOOD PRESSURE KIT DEVI: 0 refills | Status: DC

## 2022-07-05 MED ORDER — ENSURE ACTIVE HEART HEALTH PO LIQD
ORAL | 20 refills | Status: DC
Start: 2022-07-05 — End: 2023-09-30

## 2022-07-05 NOTE — Patient Instructions (Signed)
Thank you, Ms.Larie A Schrecengost for allowing [name] to provide your care today. Today we discussed your recent hospitalization. Your blood pressure is normal in clinic but but shows that you are dehydrated so I want you to drink at least 2-3 L of water daily.    Follow up with your cancer doctors as scheduled.   My Chart Access: [email or web address]  Please follow-up in 3 months  Please make sure to arrive 15 minutes prior to your next appointment. If you arrive late, you may be asked to reschedule.    We look forward to seeing you next time. Please call our clinic at [phone] if you have any questions or concerns. The best time to call is Monday-Friday from 9am-4pm, but there is someone available 24/7. If after hours or the weekend, call the main hospital number and ask for the Internal Medicine Resident On-Call. If you need medication refills, please notify your pharmacy one week in advance and they will send [name] a request.   Thank you for letting us take part in your care. Wishing you the best!  Lacinda Axon, MD 07/05/2022, 10:25 AM IM Resident, PGY-3 [facility]ian 41:10

## 2022-07-05 NOTE — Progress Notes (Unsigned)
   CC: Hospital follow-up  HPI:  [name] is a 61 y.o. female with PMH as below who presents to the clinic for hospital follow-up after recent hospitalization for syncope. Please see problem based charting for evaluation, assessment and plan.  Past Medical History:  Diagnosis Date   Anemia    Asthma 11/01/2010   only when respiratory infections   BPPV (benign paroxysmal positional vertigo) 03/07/2013   Diabetes mellitus    since 1983   Elevated TSH 05/13/2013   Endometrial cancer ([place])    GERD (gastroesophageal reflux disease)    Hypertension    Joint pain 05/26/2022   Left elbow pain 05/05/2016   Left shoulder pain 05/05/2016   Osteomyelitis ([place])    Pneumonia    as a child   Wrist pain 04/11/2013    Review of Systems:  Constitutional: Positive for fatigue, loss of appetite, weight changes and occasional dizziness. Negative for fever or chills Respiratory: Negative for shortness of breath Cardiac: Negative for chest pain Abdomen: Negative for abdominal pain, constipation or diarrhea Neuro: Positive for occasional numbness and tingling. Negative for headache or weakness  Physical Exam: General: Pleasant, well-appearing elderly woman. Appears younger than stated age.  No acute distress. Cardiac: Mild tachycardia. Regular rhythm. No murmurs, rubs or gallops. No LE edema Respiratory: Lungs CTAB. No wheezing or crackles. Abdominal: Soft, symmetric and non tender. Normal BS. Skin: Warm, dry and intact. Small healing wound/callus under R big toe. 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  Extremities: Atraumatic. Full ROM. Palpable radial and DP pulses. Neuro: A&O x 3. Moves all extremities Psych: Appropriate mood and affect.  Vitals:   07/05/22 0941  BP: (!) 123/52  Pulse: (!) 106  Temp: 98 F (36.7 C)  TempSrc: Oral  SpO2: 100%  Weight: 267 lb 11.2 oz (121.4 kg)  Height: '6\' 1"'$  (1.854 m)    Assessment & Plan:   See Encounters Tab for problem based  charting.  Patient discussed with Dr. Gardiner Sleeper, MD, MPH

## 2022-07-06 ENCOUNTER — Encounter: Payer: Self-pay | Admitting: Hematology and Oncology

## 2022-07-06 ENCOUNTER — Other Ambulatory Visit: Payer: Self-pay

## 2022-07-06 ENCOUNTER — Inpatient Hospital Stay (HOSPITAL_BASED_OUTPATIENT_CLINIC_OR_DEPARTMENT_OTHER): Payer: Medicaid Other | Admitting: Hematology and Oncology

## 2022-07-06 ENCOUNTER — Inpatient Hospital Stay: Payer: Medicaid Other

## 2022-07-06 VITALS — BP 133/63 | HR 88 | Resp 18

## 2022-07-06 DIAGNOSIS — C541 Malignant neoplasm of endometrium: Secondary | ICD-10-CM

## 2022-07-06 DIAGNOSIS — Z5111 Encounter for antineoplastic chemotherapy: Secondary | ICD-10-CM | POA: Diagnosis not present

## 2022-07-06 DIAGNOSIS — E1165 Type 2 diabetes mellitus with hyperglycemia: Secondary | ICD-10-CM | POA: Diagnosis not present

## 2022-07-06 DIAGNOSIS — T451X5A Adverse effect of antineoplastic and immunosuppressive drugs, initial encounter: Secondary | ICD-10-CM

## 2022-07-06 DIAGNOSIS — D6481 Anemia due to antineoplastic chemotherapy: Secondary | ICD-10-CM

## 2022-07-06 DIAGNOSIS — E669 Obesity, unspecified: Secondary | ICD-10-CM

## 2022-07-06 MED ORDER — SODIUM CHLORIDE 0.9 % IV SOLN
720.0000 mg | Freq: Once | INTRAVENOUS | Status: AC
  Administered 2022-07-06: 720 mg via INTRAVENOUS
  Filled 2022-07-06: qty 72

## 2022-07-06 MED ORDER — DIPHENHYDRAMINE HCL 50 MG/ML IJ SOLN
25.0000 mg | Freq: Once | INTRAMUSCULAR | Status: AC
  Administered 2022-07-06: 25 mg via INTRAVENOUS

## 2022-07-06 MED ORDER — FAMOTIDINE IN NACL 20-0.9 MG/50ML-% IV SOLN
20.0000 mg | Freq: Once | INTRAVENOUS | Status: AC
  Administered 2022-07-06: 20 mg via INTRAVENOUS

## 2022-07-06 MED ORDER — HEPARIN SOD (PORK) LOCK FLUSH 100 UNIT/ML IV SOLN
500.0000 [IU] | Freq: Once | INTRAVENOUS | Status: AC | PRN
  Administered 2022-07-06: 500 [IU]

## 2022-07-06 MED ORDER — SODIUM CHLORIDE 0.9 % IV SOLN
150.0000 mg | Freq: Once | INTRAVENOUS | Status: AC
  Administered 2022-07-06: 150 mg via INTRAVENOUS
  Filled 2022-07-06: qty 150

## 2022-07-06 MED ORDER — SODIUM CHLORIDE 0.9% FLUSH
10.0000 mL | INTRAVENOUS | Status: DC | PRN
  Administered 2022-07-06: 10 mL

## 2022-07-06 MED ORDER — SODIUM CHLORIDE 0.9 % IV SOLN
10.0000 mg | Freq: Once | INTRAVENOUS | Status: AC
  Administered 2022-07-06: 10 mg via INTRAVENOUS
  Filled 2022-07-06: qty 10

## 2022-07-06 MED ORDER — SODIUM CHLORIDE 0.9 % IV SOLN: Freq: Once | INTRAVENOUS | Status: AC

## 2022-07-06 MED ORDER — SODIUM CHLORIDE 0.9 % IV SOLN
131.2500 mg/m2 | Freq: Once | INTRAVENOUS | Status: AC
  Administered 2022-07-06: 324 mg via INTRAVENOUS
  Filled 2022-07-06: qty 54

## 2022-07-06 MED ORDER — PALONOSETRON HCL INJECTION 0.25 MG/5ML
0.2500 mg | Freq: Once | INTRAVENOUS | Status: AC
  Administered 2022-07-06: 0.25 mg via INTRAVENOUS

## 2022-07-06 MED ORDER — TRAMADOL HCL 50 MG PO TABS: 100.0000 mg | ORAL_TABLET | Freq: Four times a day (QID) | ORAL | 0 refills | Status: DC | PRN

## 2022-07-06 NOTE — Patient Instructions (Addendum)
[facility] MEDICAL ONCOLOGY  Discharge Instructions: Thank you for choosing [facility] to provide your oncology and hematology care.   If you have a lab appointment with the Cancer Center, please go directly to the Cancer Center and check in at the registration area.   Wear comfortable clothing and clothing appropriate for easy access to any Portacath or PICC line.   We strive to give you quality time with your provider. You may need to reschedule your appointment if you arrive late (15 or more minutes).  Arriving late affects you and other patients whose appointments are after yours.  Also, if you miss three or more appointments without notifying the office, you may be dismissed from the clinic at the provider's discretion.      For prescription refill requests, have your pharmacy contact our office and allow 72 hours for refills to be completed.    Today you received the following chemotherapy and/or immunotherapy agents: Paclitaxel, Carboplatin.       To help prevent nausea and vomiting after your treatment, we encourage you to take your nausea medication as directed.  BELOW ARE SYMPTOMS THAT SHOULD BE REPORTED IMMEDIATELY: *FEVER GREATER THAN 100.4 F (38 C) OR HIGHER *CHILLS OR SWEATING *NAUSEA AND VOMITING THAT IS NOT CONTROLLED WITH YOUR NAUSEA MEDICATION *UNUSUAL SHORTNESS OF BREATH *UNUSUAL BRUISING OR BLEEDING *URINARY PROBLEMS (pain or burning when urinating, or frequent urination) *BOWEL PROBLEMS (unusual diarrhea, constipation, pain near the anus) TENDERNESS IN MOUTH AND THROAT WITH OR WITHOUT PRESENCE OF ULCERS (sore throat, sores in mouth, or a toothache) UNUSUAL RASH, SWELLING OR PAIN  UNUSUAL VAGINAL DISCHARGE OR ITCHING   Items with * indicate a potential emergency and should be followed up as soon as possible or go to the Emergency Department if any problems should occur.  Please show the CHEMOTHERAPY ALERT CARD or IMMUNOTHERAPY ALERT CARD  at check-in to the Emergency Department and triage nurse.  Should you have questions after your visit or need to cancel or reschedule your appointment, please contact [facility]ICAL [facility]COL[facility]  Dept: [phone]  and follow the prompts.  Office hours are 8:00 a.m. to 4:30 p.m. Monday - Friday. Please note that voicemails left after 4:00 p.m. may not be returned until the following business day.  We are closed weekends and major holidays. You have access to a nurse at all times for urgent questions. Please call the main number to the clinic Dept: [phone] and follow the prompts.   For any non-urgent questions, you may also contact your provider using MyChart. We now offer e-Visits for anyone 18 and older to request care online for non-urgent symptoms. For details visit [email or web address].   Also download the MyChart app! Go to the app store, search "MyChart", open the app, select [facility], and log in with your MyChart username and password.  Masks are optional in the cancer centers. If you would like for your care team to wear a mask while they are taking care of you, please let them know. You may have one support person who is at least 60 years old accompany you for your appointments. 

## 2022-07-06 NOTE — Progress Notes (Signed)
  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 She will be treated with a 3.0 cm diameter segmented cylinder. This conforms to her anatomy without undue discomfort.  Vaginal brachytherapy procedure node: The patient was brought to the [facility] suite. Identity was confirmed. All relevant records and images related to the planned course of therapy were reviewed. The patient freely provided informed written consent to proceed with treatment after reviewing the details related to the planned course of therapy. The consent form was witnessed and verified by the simulation staff. Then, the patient was set-up in a stable reproducible supine position for radiation therapy. Pelvic exam revealed the vaginal cuff to be intact . The patient's custom vaginal cylinder was placed in the proximal vagina. This was affixed to the CT/MR stabilization plate to prevent slippage. Patient tolerated the placement well.  Verification simulation note:  A fiducial marker was placed within the vaginal cylinder. An AP and lateral film was then obtained through the pelvis area. This documented accurate position of the vaginal cylinder for treatment.  HDR BRACHYTHERAPY TREATMENT  The remote afterloading device was affixed to the vaginal cylinder by catheter. Patient then proceeded to  undergo her fourth high-dose-rate treatment directed at the proximal vagina. The patient was prescribed a dose of 6.0 gray to be delivered to the mucosal surface. Treatment length was 3.0 cm. Patient was treated with 1 channel using 7 dwell positions. Treatment time was 198.4 seconds. Iridium 192 was the high-dose-rate source for treatment. The patient tolerated the treatment well. After completion of her therapy, a radiation survey was performed documenting return of the iridium source into the GammaMed safe.   PLAN: The patient will return next week for her fifth high-dose-rate treatment. ________________________________  -----------------------------------  Blair Promise, PhD, MD  This document serves as a record of services personally performed by Gery Pray, MD. It was created on his behalf by Roney Mans, a trained medical scribe. The creation of this record is based on the scribe's personal observations and the provider's statements to them. This document has been checked and approved by the attending provider.

## 2022-07-06 NOTE — Assessment & Plan Note (Signed)
She had recent successful weight loss She had history of dehydration due to Ozempic We discussed importance of adequate hydration and she will call me if she felt that she needs IV fluids I will adjust the dose of her treatment today

## 2022-07-06 NOTE — Assessment & Plan Note (Signed)
She has history of uncontrolled diabetes We discussed importance of dietary modification while on treatment

## 2022-07-06 NOTE — Assessment & Plan Note (Signed)
This is likely due to recent treatment. The patient denies recent history of bleeding such as epistaxis, hematuria or hematochezia. She is asymptomatic from the anemia. I will observe for now.   

## 2022-07-06 NOTE — Assessment & Plan Note (Signed)
She has fully recovered from recent hospitalization/dehydration We discussed importance of aggressive supportive care and hydration with IV fluids as needed Due to recent weight loss, I will recalculate and adjust the dose of her treatment She will proceed with treatment as scheduled

## 2022-07-06 NOTE — Progress Notes (Signed)
[facility] OFFICE PROGRESS NOTE  Patient Care Team: Masters, [name], DO as PCP - General (Internal Medicine)  ASSESSMENT & PLAN:  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[facility]) She has fully recovered from recent hospitalization/dehydration We discussed importance of aggressive supportive care and hydration with IV fluids as needed Due to recent weight loss, I will recalculate and adjust the dose of her treatment She will proceed with treatment as scheduled  Anemia due to antineoplastic chemotherapy This is likely due to recent treatment. The patient denies recent history of bleeding such as epistaxis, hematuria or hematochezia. She is asymptomatic from the anemia. I will observe for now.    Uncontrolled diabetes mellitus with hyperglycemia ([place]) She has history of uncontrolled diabetes We discussed importance of dietary modification while on treatment  Obesity, Class I, BMI 30-34.9 She had recent successful weight loss She had history of dehydration due to Ozempic We discussed importance of adequate hydration and she will call me if she felt that she needs IV fluids I will adjust the dose of her treatment today  No orders of the defined types were placed in this encounter.   All questions were answered. The patient knows to call the clinic with any problems, questions or concerns. The total time spent in the appointment was 30 minutes encounter with patients including review of chart and various tests results, discussions about plan of care and coordination of care plan   Heath Lark, MD 07/06/2022 1:43 PM  INTERVAL HISTORY: Please see below for problem oriented charting. she returns for treatment follow-up She was recently hospitalized Then she missed her appointment several days ago She felt better She was taking Ozempic for weight loss and it caused nausea and dehydration She is feeling much better She denies worsening peripheral neuropathy while on treatment  REVIEW  OF SYSTEMS:   Constitutional: Denies fevers, chills Eyes: Denies blurriness of vision Ears, nose, mouth, throat, and face: Denies mucositis or sore throat Respiratory: Denies cough, dyspnea or wheezes Cardiovascular: Denies palpitation, chest discomfort or lower extremity swelling Skin: Denies abnormal skin rashes Lymphatics: Denies new lymphadenopathy or easy bruising Behavioral/Psych: Mood is stable, no new changes  All other systems were reviewed with the patient and are negative.  I have reviewed the past medical history, past surgical history, social history and family history with the patient and they are unchanged from previous note.  ALLERGIES:  is allergic to nsaids and penicillins.  MEDICATIONS:  Current Outpatient Medications  Medication Sig Dispense Refill   acetaminophen (TYLENOL) 650 MG CR tablet Take 1,300-1,950 mg by mouth every 8 (eight) hours as needed for pain.     atorvastatin (LIPITOR) 80 MG tablet Take 1 tablet (80 mg total) by mouth daily. 90 tablet 3   Blood Pressure Monitoring (BLOOD PRESSURE KIT) DEVI Use to check blood pressure daily 1 each 0   dexamethasone (DECADRON) 4 MG tablet Take 2 tabs at the night before chemotherapy, every 3 weeks, by mouth x 6 cycles 12 tablet 6   gabapentin (NEURONTIN) 300 MG capsule Take 3 capsules (900 mg total) by mouth 3 (three) times daily. 270 capsule 6   insulin glargine (LANTUS SOLOSTAR) 100 UNIT/ML Solostar Pen Inject 30 Units into the skin at bedtime. 9 mL 2   insulin lispro (HUMALOG) 100 UNIT/ML injection Inject 0.07 mLs (7 Units total) into the skin 3 (three) times daily with meals. 10 mL 3   lidocaine-prilocaine (EMLA) cream Apply to affected area once 30 g 3   lisinopril (ZESTRIL) 20 MG  tablet Take 1 tablet (20 mg total) by mouth daily. 30 tablet 11   meclizine (ANTIVERT) 25 MG tablet Take 1 tablet (25 mg total) by mouth 3 (three) times daily as needed for dizziness. 60 tablet 1   metFORMIN (GLUCOPHAGE-XR) 500 MG 24 hr  tablet Take 4 tablets (2,000 mg total) by mouth at bedtime. 360 tablet 3   Multiple Vitamin (MULTIVITAMIN WITH MINERALS) TABS tablet Take 1 tablet by mouth every evening.      Nutritional Supplements ([place]) LIQD Take 1 bottle twice daily between meals 237 mL 20   omeprazole (PRILOSEC) 20 MG capsule Take 1 capsule (20 mg total) by mouth daily. (Patient taking differently: Take 20 mg by mouth daily as needed (acid reflux).) 30 capsule 1   ondansetron (ZOFRAN) 8 MG tablet Take 1 tablet (8 mg total) by mouth 2 (two) times daily as needed. Start on the third day after chemotherapy. 30 tablet 1   prochlorperazine (COMPAZINE) 10 MG tablet Take 1 tablet (10 mg total) by mouth every 6 (six) hours as needed (Nausea or vomiting). 30 tablet 1   Semaglutide, 2 MG/DOSE, 8 MG/3ML SOPN Inject 2 mg into the skin once a week. 10 mL 3   traMADol (ULTRAM) 50 MG tablet Take 2 tablets (100 mg total) by mouth every 6 (six) hours as needed for severe pain. 60 tablet 0   traZODone (DESYREL) 50 MG tablet Take 1 tablet (50 mg total) by mouth at bedtime as needed for sleep. 30 tablet 1   No current facility-administered medications for this visit.   Facility-Administered Medications Ordered in Other Visits  Medication Dose Route Frequency Provider Last Rate Last Admin   CARBOplatin (PARAPLATIN) 720 mg in sodium chloride 0.9 % 250 mL chemo infusion  720 mg Intravenous Once Alvy Bimler, Treesa Mccully, MD       heparin lock flush 100 unit/mL  500 Units Intracatheter Once PRN Alvy Bimler, Jinnifer Montejano, MD       PACLitaxel (TAXOL) 324 mg in sodium chloride 0.9 % 500 mL chemo infusion (> 43m/m2)  131.25 mg/m2 (Treatment Plan Recorded) Intravenous Once GAlvy Bimler Laxmi Choung, MD 185 mL/hr at 07/06/22 1050 324 mg at 07/06/22 1050   sodium chloride flush (NS) 0.9 % injection 10 mL  10 mL Intracatheter PRN GHeath Lark MD        SUMMARY OF ONCOLOGIC HISTORY: Oncology History Overview Note  P53 wild type   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([place]   02/01/2022 Initial Diagnosis   She presented with postmenopausal bleeding   02/07/2022 Imaging   UKoreapelvis Thickened heterogeneous endometrium with masslike region with internal vascularity. Findings may be associated with endometrial carcinoma or hyperplasia. Biopsy is recommended for further evaluation.   02/22/2022 Initial Biopsy   EMB: MMMT/carcinosarcoma (predominance of sarcomatous component)   03/05/2022 Imaging   Ct chest, abdomen and pelvis 1. Heterogeneous enlargement of the endometrial cavity is compatible with the reported history of uterine carcinosarcoma. No definite involvement of the posterior bladder wall or sigmoid colon/rectum. 2. 9 mm short axis right external iliac node is upper normal for size. Attention on follow-up recommended. Otherwise no lymphadenopathy in the abdomen or pelvis. 3. 3.6 x 2.7 cm lesion in the lateral segment left liver has subtle peripheral nodular enhancement. This is almost certainly a benign cavernous hemangioma, but MRI abdomen with and without contrast recommended to confirm. 4. Aortic Atherosclerosis (ICD10-I70.0).   03/08/2022 Initial Diagnosis   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([place]   03/30/2022 Surgery   TRH/BSO, SLN biopsy on right, left  pelvic LND, LOS, mini-lap  Findings: On EUA, 12cm minimally mobile uterus. On intra-abdominal exam, normal upper abdominal survey. Omentum adherent to the anterior abdominal wall along the prior midline incision. Normal omentum otherwise, normal small and large bowel. 12 cm uterus densely adherent to the anterior abdominal wall, obliterating some of the anterior anatomy including the anterior cul de sac. Normal appearing bilateral adnexa. Mapping successful to right obturator SLN, mildly enlarged. Dye seen within the parametrium on the left, no SLNs identified. No obvious adenopathy on the left. Decision made given length of surgery, comorbitidies to defer left para-aortic lymphadenectomy. Mini-lap required for  specimen delivery. Dome intact on cystoscopy and good efflux noted from bilateral ureteral orifices.   03/30/2022 Pathology Results   MMMT/carcinosarcoma, 6.3 cm MI 1cm or 3 (<50%) Cervical stroma, bilateral tubes/ovaries benign R SLN and L pelvic LNDs negative  ONCOLOGY TABLE:   UTERUS, CARCINOMA OR CARCINOSARCOMA: Resection   Procedure: Total hysterectomy and bilateral salpingo-oophorectomy  Histologic Type:  Malignant mixed Mullerian tumor (MMMT/ carcinosarcoma)  Histologic Grade: High-grade  Myometrial Invasion:       Depth of Myometrial Invasion (mm): 10 mm       Myometrial Thickness (mm): 30 mm       Percentage of Myometrial Invasion: 33%  Uterine Serosa Involvement: Not identified  Cervical stromal Involvement: Not identified  Extent of involvement of other tissue/organs: Not identified  Peritoneal/Ascitic Fluid: Not applicable  Lymphovascular Invasion: Not identified  Regional Lymph Nodes:       Pelvic Lymph Nodes Examined:                                   1 Sentinel                                   6 Non-sentinel                                   7 Total       Pelvic Lymph Nodes with Metastasis: 0                           Macrometastasis: (>2.0 mm): 0                           Micrometastasis: (>0.2 mm and < 2.0 mm): 0                           Isolated Tumor Cells (<0.2 mm): 0                           Laterality of Lymph Node with Tumor: Not  applicable                           Extracapsular Extension: Not applicable       Para-aortic Lymph Nodes Examined:                                    0 Sentinel  0 non-sentinel                                    0 total  Distant Metastasis:       Distant Site(s) Involved: Not applicable  Pathologic Stage Classification (pTNM, AJCC 8th Edition): pT1a, pN0  Ancillary Studies: MMR / MSI testing will be ordered  Representative Tumor Block: B1  Comment(s): Pancytokeratin was performed on  the lymph nodes and is  negative.    04/08/2022 Cancer Staging   Staging form: Corpus Uteri - Carcinoma and Carcinosarcoma, AJCC 8th Edition - Pathologic stage from 04/08/2022: FIGO Stage IA (pT1a, pN0, cM0) - Signed by Heath Lark, MD on 04/08/2022 Stage prefix: Initial diagnosis   04/21/2022 Procedure   Placement of single lumen port a cath via right internal jugular vein. The catheter tip lies at the cavo-atrial junction. A power injectable port a cath was placed and is ready for immediate use.       04/29/2022 -  Chemotherapy   Patient is on Treatment Plan : UTERINE Carboplatin AUC 6 / Paclitaxel q21d       PHYSICAL EXAMINATION: ECOG PERFORMANCE STATUS: 1 - Symptomatic but completely ambulatory  Vitals:   07/06/22 0813  BP: 138/66  Pulse: 92  Resp: 16  Temp: 97.9 F (36.6 C)  SpO2: 97%   Filed Weights   07/06/22 0813  Weight: 259 lb 6.4 oz (117.7 kg)    GENERAL:alert, no distress and comfortable NEURO: alert & oriented x 3 with fluent speech, no focal motor/sensory deficits  LABORATORY DATA:  I have reviewed the data as listed    Component Value Date/Time   NA 134 (L) 07/04/2022 1124   NA 139 09/22/2021 0954   K 4.7 07/04/2022 1124   CL 101 07/04/2022 1124   CO2 24 07/04/2022 1124   GLUCOSE 271 (H) 07/04/2022 1124   BUN 18 07/04/2022 1124   BUN 13 09/22/2021 0954   CREATININE 0.93 07/04/2022 1124   CREATININE 0.67 04/23/2015 1645   CALCIUM 9.3 07/04/2022 1124   PROT 8.3 (H) 07/04/2022 1124   PROT 7.3 09/22/2021 0954   ALBUMIN 4.0 07/04/2022 1124   ALBUMIN 4.4 09/22/2021 0954   AST 9 (L) 07/04/2022 1124   ALT 7 07/04/2022 1124   ALKPHOS 88 07/04/2022 1124   BILITOT 0.3 07/04/2022 1124   GFRNONAA >60 07/04/2022 1124   GFRNONAA >89 01/06/2014 1643   GFRAA >60 02/25/2020 0334   GFRAA >89 01/06/2014 1643    No results found for: "SPEP", "UPEP"  Lab Results  Component Value Date   WBC 7.1 07/04/2022   NEUTROABS 6.2 07/04/2022   HGB 9.8 (L) 07/04/2022    HCT 29.8 (L) 07/04/2022   MCV 93.7 07/04/2022   PLT 359 07/04/2022      Chemistry      Component Value Date/Time   NA 134 (L) 07/04/2022 1124   NA 139 09/22/2021 0954   K 4.7 07/04/2022 1124   CL 101 07/04/2022 1124   CO2 24 07/04/2022 1124   BUN 18 07/04/2022 1124   BUN 13 09/22/2021 0954   CREATININE 0.93 07/04/2022 1124   CREATININE 0.67 04/23/2015 1645      Component Value Date/Time   CALCIUM 9.3 07/04/2022 1124   ALKPHOS 88 07/04/2022 1124   AST 9 (L) 07/04/2022 1124   ALT 7 07/04/2022 1124   BILITOT 0.3 07/04/2022 1124

## 2022-07-07 ENCOUNTER — Other Ambulatory Visit: Payer: Self-pay

## 2022-07-07 ENCOUNTER — Encounter (HOSPITAL_BASED_OUTPATIENT_CLINIC_OR_DEPARTMENT_OTHER): Payer: Medicaid Other | Admitting: Internal Medicine

## 2022-07-07 ENCOUNTER — Encounter: Payer: Self-pay | Admitting: Student

## 2022-07-07 ENCOUNTER — Ambulatory Visit
Admission: RE | Admit: 2022-07-07 | Discharge: 2022-07-07 | Disposition: A | Discharge status: Home / Self Care | Payer: Medicaid Other | Source: Ambulatory Visit | Attending: Radiation Oncology | Admitting: Radiation Oncology

## 2022-07-07 ENCOUNTER — Other Ambulatory Visit: Payer: Self-pay | Admitting: Student

## 2022-07-07 DIAGNOSIS — L84 Corns and callosities: Secondary | ICD-10-CM | POA: Diagnosis not present

## 2022-07-07 DIAGNOSIS — C541 Malignant neoplasm of endometrium: Secondary | ICD-10-CM | POA: Diagnosis not present

## 2022-07-07 DIAGNOSIS — Z09 Encounter for follow-up examination after completed treatment for conditions other than malignant neoplasm: Secondary | ICD-10-CM | POA: Diagnosis not present

## 2022-07-07 HISTORY — DX: Corns and callosities: L84

## 2022-07-07 LAB — RAD ONC ARIA SESSION SUMMARY
Course Elapsed Days: 24
Plan Fractions Treated to Date: 4
Plan Prescribed Dose Per Fraction: 6 Gy
Plan Total Fractions Prescribed: 5
Plan Total Prescribed Dose: 30 Gy
Reference Point Dosage Given to Date: 24 Gy
Reference Point Session Dosage Given: 6 Gy
Session Number: 4

## 2022-07-07 NOTE — Assessment & Plan Note (Signed)
Patient diagnosed with carcinosarcoma of the endometrium in March 2023 after presenting with postmenopausal bleeding now status post total hysterectomy with bilateral salpingo oophorectomy in May 2023 now undergoing radiation and chemotherapy.  Follows closely with Dr. Alvy Bimler, [facility] and Dr. Sondra Come, [place] -Continue to follow medical oncology and radiation oncology -Continue IV paclitaxel and carboplatin every 21 days (s/p 3/6), next dose 8/9 -Continue radiation therapy (2/5), next session 8/10

## 2022-07-07 NOTE — Assessment & Plan Note (Signed)
A1c down to 5.7% during hospitalization 2 weeks ago. Regimen modify during hospitalization to reflect current blood sugar ranges.  Patient was discharged on 30 units of Lantus at bedtime and Humalog 7 units 3 times daily with meals. Patient reports that after resuming Ozempic at home, she started to have worsening appetite as well as dehydration. She discontinue her Ozempic and has felt better for the last 2 weeks. Patient reports she misplaced part of her Dexcom. She will look for it at home and call the clinic if she is unable to find it.  She denies any hypoglycemic episodes at home.  Patient is down 14 pounds since her last office visit in April.  Plan: -Continue Lantus 30 units at bedtime -Continue Humalog 7 units 3 times daily with meals -Continue metformin 2000 mg daily at bedtime -Continue to hold Ozempic -Repeat A1c in 3 months

## 2022-07-07 NOTE — Progress Notes (Signed)
ESTELLE, GREENLEAF ([id number]) Visit Report for 07/07/2022 Chief Complaint Document Details Patient Name: Date of Service: [name], Debra A. 07/07/2022 9:00 [place] Record Number: [id number] Patient Account Number: [id number] Date of Birth/Sex: Treating RN: [birth year] (61 y.o. [name] Primary Care Provider: Christiana Fuchs Other Clinician: Referring Provider: Treating Provider/Extender: Doran Stabler in Treatment: 1 Information Obtained from: Patient Chief Complaint 06/30/2022; right plantar foot wound Electronic Signature(s) Signed: 07/07/2022 10:24:46 AM By: Kalman Shan Rivera Entered By: Kalman Shan on 07/07/2022 10:12:07 -------------------------------------------------------------------------------- HPI Details Patient Name: Date of Service: [name], [name] A. 07/07/2022 9:00 [place] Record Number: [id number] Patient Account Number: [id number] Date of Birth/Sex: Treating RN: December 24, 1960 (61 y.o. [name] Primary Care Provider: Christiana Fuchs Other Clinician: Referring Provider: Treating Provider/Extender: Doran Stabler in Treatment: 1 History of Present Illness Location: notice some discharge and callus on the right big toe Quality: Patient reports experiencing a dull pain to affected area(s). Severity: Patient states wound(s) are getting worse. Duration: Patient has had the wound for < 2 weeks prior to presenting for treatment Timing: Pain in wound is Intermittent (comes and goes Context: The wound occurred when the patient was cutting her toenails and noticed some discharge from this area and was very concerned about it. Modifying Factors: Patient wound(s)/ulcer(s) are improving due to:he has seen her PCP who put her on Bactrim and Keflex antibiotic ssociated Signs and Symptoms: Patient reports having decrease discharge she started on her antibiotic. A HPI Description: [name] is a  61 year old female with a past medical history of uncontrolled type 2 diabetes on insulin, osteomyelitis with amputations of 1st and 2nd toes of her left foot that presents to clinic for wound care to the right great toe wound. She states she noticed the wound 3 weeks ago. Not quite sure how it started. She has been using silver alginate to the area. She was recently hospitalized from a clinic visit in the internal medicine clinic for this issue. While hospitalized she had an MRI of the right foot that showed likely early acute osteomyelitis with possible septic arthritis. She was discharged on ciprofloxacin for 28 days. She currently does not deny pain increased warmth or erythema to the area. She has some drainage but not purulent. 6/9; patient presents for 1 week follow-up. She has no complaints today and denies signs of infection. She missed her infectious disease appointment yesterday but has it rescheduled for the 21st. She has been using silver alginate to the wound. 6/23; patient presents for 2-week follow-up. She followed up with Dr. Megan Salon, infectious disease 2 days ago. She had some blood work done and was told to finish ciprofloxacin. She is having a follow-up virtual encounter next week. There has been an increase in callus development. She is unable to use her offloading shoe due to having to walk for long periods due to needing public transportation. She continues to use silver alginate to the wound. She denies signs of infection. 7/8; patient presents for follow-up. She missed her appointment last week. She reports increased callus to the surrounding wound area. She continues to use silver alginate. She denies signs of infection. 7/14; patient presents for 1 week follow-up. She has been using silver alginate to the wound bed. She has no issues or complaints today. She denies signs of infection. 7/28; patient presents for 2-week follow-up. She has been using silver alginate to the  wound bed. She has been moving to  a new location over the past week. She has no issues or complaints today. She denies signs of infection. 8/18; patient presents for follow-up. She was recently hospitalized for COVID and has not been able to follow-up for wound care. She was last seen 3 weeks ago. She reports receiving wound care while hospitalized. Since discharge of the hospital she is reported more drainage from the wound site. She denies signs of infection. 8/25; patient presents for follow-up. She has no issues or complaints today. She has been using silver alginate daily to the wound bed. She denies signs of infection. 9/15; patient presents for follow-up. She missed her last clinic appointment. She reports using silver alginate to the wound bed. She denies signs of infection. Due to transportation she has to walk for long periods of time. It is hard for the patient to offload this area well. 9/22; patient presents for follow-up. She has been using silver alginate to the wound bed. She reports a new wound to the distal portion of her right great toe. She states she stopped this last week. She has started Keflex for a wound culture done at last clinic visit. Due to cost she is not able to use the gentamicin cream prescribed. She states she is going to try different pharmacy to see if she can obtain this 10/6; patient presents for follow-up. She has been using gentamicin cream and silver alginate to the wound beds. The most distal portion of the right great toe has healed. She has no issues or complaints today. She denies signs of infection. 10/20; patient presents for follow-up. She has been using gentamicin cream and silver alginate to the wound bed and reports that it has closed over in the past week with callus. She removed part of her right great toenail and is following up with podiatry for this issue. She currently denies signs of infection. 11/10; patient presents for follow-up. She  reports no drainage in the past week. She has no issues or complaints today. She denies signs of infection. She has been keeping the toe covered. Readmission 07/27/2022 [name] is a 61 year old female with a past medical history of insulin-dependent type 2 diabetes currently controlled and newly diagnosed endometrial cancer on chemotherapy and radiation that presents to the clinic for a wound to the plantar aspect of her right foot. She has a callus to this area however noticed bleeding about 1 week ago from the site. Since this has started she has been using gentamicin ointment to the area. She currently denies signs of infection. 8/10; patient presents for follow-up. She has been using gentamicin and silver alginate to the wound bed. She completed doxycycline. She reports improvement in healing. She denies drainage. She has an appointment with podiatry next week for orthotics. Electronic Signature(s) Signed: 07/07/2022 10:24:46 AM By: Kalman Shan Rivera Entered By: Kalman Shan on 07/07/2022 10:16:33 -------------------------------------------------------------------------------- Paring/cutting of benign hyperkeratotic lesion 2-4 Details Patient Name: Date of Service: MARKIA, KYER A. 07/07/2022 9:00 [place] Record Number: [id number] Patient Account Number: [id number] Date of Birth/Sex: Treating RN: [birth year] (61 y.o. [name] Primary Care Provider: Christiana Fuchs Other Clinician: Referring Provider: Treating Provider/Extender: Doran Stabler in Treatment: 1 Procedure Performed for: Non-Wound Location Performed By: Physician Kalman Shan, Rivera Post Procedure Diagnosis Same as Pre-procedure Notes using a number 3 curette. lidocaine 4% applied prior to parring the callous. Electronic Signature(s) Signed: 07/07/2022 10:24:46 AM By: Kalman Shan Rivera Signed: 07/07/2022 5:06:20 PM By: Deon Pilling  RN, BSN Entered By: Deon Pilling on  07/07/2022 09:26:34 -------------------------------------------------------------------------------- Physical Exam Details Patient Name: Date of Service: [name], Debra A. 07/07/2022 9:00 [place] Record Number: [id number] Patient Account Number: [id number] Date of Birth/Sex: Treating RN: May 26, 1961 (61 y.o. [name] Primary Care Provider: Christiana Fuchs Other Clinician: Referring Provider: Treating Provider/Extender: Doran Stabler in Treatment: 1 Constitutional respirations regular, non-labored and within target range for patient.. Cardiovascular 2+ dorsalis pedis/posterior tibialis pulses. Psychiatric pleasant and cooperative. Notes Right foot: T the plantar aspect there is callus and epithelization to the previous wound site. No drainage noted. o Electronic Signature(s) Signed: 07/07/2022 10:24:46 AM By: Kalman Shan Rivera Entered By: Kalman Shan on 07/07/2022 10:17:38 -------------------------------------------------------------------------------- Physician Orders Details Patient Name: Date of Service: [name], [name] A. 07/07/2022 9:00 A M Medical Record Number: [id number] Patient Account Number: [id number] Date of Birth/Sex: Treating RN: [birth year] (61 y.o. [name] Primary Care Provider: Christiana Fuchs Other Clinician: Referring Provider: Treating Provider/Extender: Doran Stabler in Treatment: 1 Verbal / Phone Orders: No Diagnosis Coding ICD-10 Coding Code Description E11.621 Type 2 diabetes mellitus with foot ulcer L97.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 exposed L84 Corns and callosities C54.1 Malignant neoplasm of endometrium Discharge From [facility] Services Discharge from [place] - Call if any future wound care needs. Pad closed area to right plantar foot. Ensure to go to see Dr. Blenda Mounts Podiatry on 07/13/2022. Anesthetic Topical Lidocaine 4%  added to wound bed - apply to callous area on right plantar foot. Off-Loading Other: - limit pressure to right foot. Patient Medications llergies: ibuprofen, aspirin, penicillin A Notifications Medication Indication Start End lidocaine DOSE topical 4 % cream - cream topical applied only in clinic with debridements Electronic Signature(s) Signed: 07/07/2022 10:24:46 AM By: Kalman Shan Rivera Entered By: Kalman Shan on 07/07/2022 10:17:45 Prescription 07/07/2022 -------------------------------------------------------------------------------- [name] Patient Name: Provider: [birth year] [id number] Date of Birth: NPI#: [id number] Sex: DEA #: [phone] Phone #: License #: [place] Patient Address: [address] [place], [place] [address] [place], [place] [address] [phone] Allergies ibuprofen; aspirin; penicillin Medication Medication: Route: Strength: Form: lidocaine 4 % topical cream topical 4% cream Class: TOPICAL LOCAL ANESTHETICS Dose: Frequency / Time: Indication: cream topical applied only in clinic with debridements Number of Refills: Number of Units: 0 Generic Substitution: Start Date: End Date: One Time Use: Substitution Permitted No Note to Pharmacy: Hand Signature: Date(s): Electronic Signature(s) Signed: 07/07/2022 10:24:46 AM By: Kalman Shan Rivera Entered By: Kalman Shan on 07/07/2022 10:17:46 -------------------------------------------------------------------------------- Problem List Details Patient Name: Date of Service: [name], [name] A. 07/07/2022 9:00 A M Medical Record Number: [id number] Patient Account Number: [id number] Date of Birth/Sex: Treating RN: 09/15/1961 (61 y.o. [name] Primary Care Provider: Christiana Fuchs Other Clinician: Referring Provider: Treating Provider/Extender: Doran Stabler in Treatment: 1 Active Problems ICD-10 Encounter Code Description Active Date MDM Diagnosis E11.621 Type 2 diabetes mellitus with foot ulcer 06/30/2022 No Yes L97.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 exposed 06/30/2022 No Yes L84 Corns and callosities 06/30/2022 No Yes C54.1 Malignant neoplasm of endometrium 06/30/2022 No Yes Inactive Problems Resolved Problems Electronic Signature(s) Signed: 07/07/2022 10:24:46 AM By: Kalman Shan Rivera Entered By: Kalman Shan on 07/07/2022 10:11:55 -------------------------------------------------------------------------------- Progress Note/History and Physical Details Patient Name: Date  of Service: [name], Debra A. 07/07/2022 9:00 A M Medical Record Number: [id number] Patient Account Number: [id number] Date of Birth/Sex: Treating RN: July 02, 1961 (61 y.o. [name] Primary Care Provider: Christiana Fuchs Other Clinician: Referring Provider: Treating Provider/Extender: Doran Stabler in Treatment: 1 Subjective Chief Complaint Information obtained from Patient 06/30/2022; right plantar foot wound History of Present Illness (HPI) The following HPI elements were documented for the patient's wound: Location: notice some discharge and callus on the right big toe Quality: Patient reports experiencing a dull pain to affected area(s). Severity: Patient states wound(s) are getting worse. Duration: Patient has had the wound for < 2 weeks prior to presenting for treatment Timing: Pain in wound is Intermittent (comes and goes Context: The wound occurred when the patient was cutting her toenails and noticed some discharge from this area and was very concerned about it. Modifying Factors: Patient wound(s)/ulcer(s) are improving due to:he has seen her PCP who put her on Bactrim and Keflex antibiotic Associated Signs and Symptoms: Patient reports having decrease  discharge she started on her antibiotic. [name] is a 61 year old female with a past medical history of uncontrolled type 2 diabetes on insulin, osteomyelitis with amputations of 1st and 2nd toes of her left foot that presents to clinic for wound care to the right great toe wound. She states she noticed the wound 3 weeks ago. Not quite sure how it started. She has been using silver alginate to the area. She was recently hospitalized from a clinic visit in the internal medicine clinic for this issue. While hospitalized she had an MRI of the right foot that showed likely early acute osteomyelitis with possible septic arthritis. She was discharged on ciprofloxacin for 28 days. She currently does not deny pain increased warmth or erythema to the area. She has some drainage but not purulent. 6/9; patient presents for 1 week follow-up. She has no complaints today and denies signs of infection. She missed her infectious disease appointment yesterday but has it rescheduled for the 21st. She has been using silver alginate to the wound. 6/23; patient presents for 2-week follow-up. She followed up with Dr. Megan Salon, infectious disease 2 days ago. She had some blood work done and was told to finish ciprofloxacin. She is having a follow-up virtual encounter next week. There has been an increase in callus development. She is unable to use her offloading shoe due to having to walk for long periods due to needing public transportation. She continues to use silver alginate to the wound. She denies signs of infection. 7/8; patient presents for follow-up. She missed her appointment last week. She reports increased callus to the surrounding wound area. She continues to use silver alginate. She denies signs of infection. 7/14; patient presents for 1 week follow-up. She has been using silver alginate to the wound bed. She has no issues or complaints today. She denies signs of infection. 7/28; patient presents  for 2-week follow-up. She has been using silver alginate to the wound bed. She has been moving to a new location over the past week. She has no issues or complaints today. She denies signs of infection. 8/18; patient presents for follow-up. She was recently hospitalized for COVID and has not been able to follow-up for wound care. She was last seen 3 weeks ago. She reports receiving wound care while hospitalized. Since discharge of the hospital she is reported more drainage from the wound site. She denies signs of infection. 8/25; patient presents for follow-up.  She has no issues or complaints today. She has been using silver alginate daily to the wound bed. She denies signs of infection. 9/15; patient presents for follow-up. She missed her last clinic appointment. She reports using silver alginate to the wound bed. She denies signs of infection. Due to transportation she has to walk for long periods of time. It is hard for the patient to offload this area well. 9/22; patient presents for follow-up. She has been using silver alginate to the wound bed. She reports a new wound to the distal portion of her right great toe. She states she stopped this last week. She has started Keflex for a wound culture done at last clinic visit. Due to cost she is not able to use the gentamicin cream prescribed. She states she is going to try different pharmacy to see if she can obtain this 10/6; patient presents for follow-up. She has been using gentamicin cream and silver alginate to the wound beds. The most distal portion of the right great toe has healed. She has no issues or complaints today. She denies signs of infection. 10/20; patient presents for follow-up. She has been using gentamicin cream and silver alginate to the wound bed and reports that it has closed over in the past week with callus. She removed part of her right great toenail and is following up with podiatry for this issue. She currently denies signs  of infection. 11/10; patient presents for follow-up. She reports no drainage in the past week. She has no issues or complaints today. She denies signs of infection. She has been keeping the toe covered. Readmission 07/27/2022 Ms. Sukhmani Buzzelli is a 61 year old female with a past medical history of insulin-dependent type 2 diabetes currently controlled and newly diagnosed endometrial cancer on chemotherapy and radiation that presents to the clinic for a wound to the plantar aspect of her right foot. She has a callus to this area however noticed bleeding about 1 week ago from the site. Since this has started she has been using gentamicin ointment to the area. She currently denies signs of infection. 8/10; patient presents for follow-up. She has been using gentamicin and silver alginate to the wound bed. She completed doxycycline. She reports improvement in healing. She denies drainage. She has an appointment with podiatry next week for orthotics. Patient History Information obtained from Patient, Caregiver. Family History Diabetes - Father,Child, Hypertension - Mother,Father, No family history of Cancer, Heart Disease, Hereditary Spherocytosis, Kidney Disease, Lung Disease, Seizures, Stroke, Thyroid Problems, Tuberculosis. Social History Never smoker, Marital Status - Divorced, Alcohol Use - Never, Drug Use - No History, Caffeine Use - Rarely - soda. Medical History Eyes Denies history of Cataracts, Glaucoma, Optic Neuritis Ear/Nose/Mouth/Throat Denies history of Chronic sinus problems/congestion, Middle ear problems Hematologic/Lymphatic Patient has history of Anemia Denies history of Hemophilia, Human Immunodeficiency Virus, Lymphedema, Sickle Cell Disease Respiratory Patient has history of Asthma Denies history of Aspiration, Chronic Obstructive Pulmonary Disease (COPD), Pneumothorax, Sleep Apnea, Tuberculosis Cardiovascular Patient has history of Hypertension Denies history of Angina,  Arrhythmia, Congestive Heart Failure, Coronary Artery Disease, Deep Vein Thrombosis, Hypotension, Myocardial Infarction, Peripheral Arterial Disease, Peripheral Venous Disease, Phlebitis, Vasculitis Gastrointestinal Denies history of Cirrhosis , Colitis, Crohnoos, Hepatitis A, Hepatitis B, Hepatitis C Endocrine Patient has history of Type II Diabetes Denies history of Type I Diabetes Genitourinary Denies history of End Stage Renal Disease Immunological Denies history of Lupus Erythematosus, Raynaudoos, Scleroderma Integumentary (Skin) Denies history of History of Burn Musculoskeletal Patient has history of Osteomyelitis -  hx left great toe Denies history of Gout, Rheumatoid Arthritis, Osteoarthritis Neurologic Patient has history of Neuropathy Denies history of Dementia, Quadriplegia, Paraplegia, Seizure Disorder Oncologic Patient has history of Received Chemotherapy - currently, Received Radiation - 06/27/2022 Psychiatric Patient has history of Confinement Anxiety Denies history of Anorexia/bulimia Patient is treated with Insulin, Oral Agents. Blood sugar is tested. Blood sugar results noted at the following times: Lunch - 120-160. Hospitalization/Surgery History - left second toe amputation. - left great toe amputation. - c-section x4. - sepsis 06/25/2022 inpatient. - T hysterectomy otal 03/30/22. - port inserted. Medical A Surgical History Notes nd Gastrointestinal GERD Oncologic endometrial Cancer dx 03/2022 Objective Constitutional respirations regular, non-labored and within target range for patient.. Vitals Time Taken: 8:42 AM, Pulse: 111 bpm, Respiratory Rate: 18 breaths/min, Blood Pressure: 129/85 mmHg. Cardiovascular 2+ dorsalis pedis/posterior tibialis pulses. Psychiatric pleasant and cooperative. General Notes: Right foot: T the plantar aspect there is callus and epithelization to the previous wound site. No drainage noted. o Integumentary (Hair, Skin) Wound #16  status is Open. Original cause of wound was Blister. The date acquired was: 06/16/2022. The wound has been in treatment 1 weeks. The wound is located on the [place]. The wound measures 0cm length x 0cm width x 0cm depth; 0cm^2 area and 0cm^3 volume. There is Fat Layer (Subcutaneous Tissue) exposed. There is no tunneling or undermining noted. There is a medium amount of serosanguineous drainage noted. The wound margin is distinct with the outline attached to the wound base. There is no granulation within the wound bed. There is no necrotic tissue within the wound bed. Assessment Active Problems ICD-10 Type 2 diabetes mellitus with foot ulcer Non-pressure chronic ulcer of other part of right foot with fat layer exposed Corns and callosities Malignant neoplasm of endometrium Patient has done well with silver alginate and gentamicin ointment. She completed doxycycline. Her wound is healed. I pared down the callus. She has an appointment with podiatry next week for orthotics. She may follow-up as needed. Procedures A Paring/cutting of benign hyperkeratotic lesion 2-4 procedure was performed. by Kalman Shan, Rivera. Post procedure Diagnosis Wound #: Same as Pre-Procedure Notes: using a number 3 curette. lidocaine 4% applied prior to parring the callous. Plan Discharge From [facility] Services: Discharge from [place] - Call if any future wound care needs. Pad closed area to right plantar foot. Ensure to go to see Dr. Blenda Mounts Podiatry on 07/13/2022. Anesthetic: Topical Lidocaine 4% added to wound bed - apply to callous area on right plantar foot. Off-Loading: Other: - limit pressure to right foot. The following medication(s) was prescribed: lidocaine topical 4 % cream cream topical applied only in clinic with debridements was prescribed at facility 1. Offloadingoofelt pad, appointment with podiatry for orthotics 2. Discharge from clinic due to closed wound 3. Follow-up as  needed Electronic Signature(s) Signed: 07/07/2022 10:24:46 AM By: Kalman Shan Rivera Signed: 07/07/2022 10:24:46 AM By: Kalman Shan Rivera Entered By: Kalman Shan on 07/07/2022 10:19:00 -------------------------------------------------------------------------------- HxROS Details Patient Name: Date of Service: [name], Debra A. 07/07/2022 9:00 A M Medical Record Number: [id number] Patient Account Number: [id number] Date of Birth/Sex: Treating RN: Jan 31, 1961 (61 y.o. [name] Primary Care Provider: Christiana Fuchs Other Clinician: Referring Provider: Treating Provider/Extender: Doran Stabler in Treatment: 1 Label Progress Note Print Version as History and Physical for this encounter Information Obtained From Patient Caregiver Eyes Medical History: Negative for: Cataracts; Glaucoma; Optic Neuritis Ear/Nose/Mouth/Throat Medical History: Negative for: Chronic sinus problems/congestion; Middle ear problems  Hematologic/Lymphatic Medical History: Positive for: Anemia Negative for: Hemophilia; Human Immunodeficiency Virus; Lymphedema; Sickle Cell Disease Respiratory Medical History: Positive for: Asthma Negative for: Aspiration; Chronic Obstructive Pulmonary Disease (COPD); Pneumothorax; Sleep Apnea; Tuberculosis Cardiovascular Medical History: Positive for: Hypertension Negative for: Angina; Arrhythmia; Congestive Heart Failure; Coronary Artery Disease; Deep Vein Thrombosis; Hypotension; Myocardial Infarction; Peripheral Arterial Disease; Peripheral Venous Disease; Phlebitis; Vasculitis Gastrointestinal Medical History: Negative for: Cirrhosis ; Colitis; Crohns; Hepatitis A; Hepatitis B; Hepatitis C Past Medical History Notes: GERD Endocrine Medical History: Positive for: Type II Diabetes Negative for: Type I Diabetes Time with diabetes: 47 years Treated with: Insulin, Oral agents Blood sugar tested every day: Yes Tested : 2 times per  day Blood sugar testing results: Lunch: 120-160 Genitourinary Medical History: Negative for: End Stage Renal Disease Immunological Medical History: Negative for: Lupus Erythematosus; Raynauds; Scleroderma Integumentary (Skin) Medical History: Negative for: History of Burn Musculoskeletal Medical History: Positive for: Osteomyelitis - hx left great toe Negative for: Gout; Rheumatoid Arthritis; Osteoarthritis Neurologic Medical History: Positive for: Neuropathy Negative for: Dementia; Quadriplegia; Paraplegia; Seizure Disorder Oncologic Medical History: Positive for: Received Chemotherapy - currently; Received Radiation - 06/27/2022 Past Medical History Notes: endometrial Cancer dx 03/2022 Psychiatric Medical History: Positive for: Confinement Anxiety Negative for: Anorexia/bulimia Immunizations Pneumococcal Vaccine: Received Pneumococcal Vaccination: Yes Received Pneumococcal Vaccination On or After 60th Birthday: No Immunization Notes: pt. 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 c-section x4 sepsis 06/25/2022 inpatient T hysterectomy 03/30/22 otal port inserted Family and Social History Cancer: No; Diabetes: Yes - Father,Child; Heart Disease: No; Hereditary Spherocytosis: No; Hypertension: Yes - Mother,Father; Kidney Disease: No; Lung Disease: No; Seizures: No; Stroke: No; Thyroid Problems: No; Tuberculosis: No; Never smoker; Marital Status - Divorced; Alcohol Use: Never; Drug Use: No History; Caffeine Use: Rarely - soda; Financial Concerns: No; Food, Clothing or Shelter Needs: No; Support System Lacking: No; Transportation Concerns: No Electronic Signature(s) Signed: 07/07/2022 10:24:46 AM By: Kalman Shan Rivera Signed: 07/07/2022 5:06:20 PM By: Deon Pilling RN, BSN Entered By: Kalman Shan on 07/07/2022  10:16:41 -------------------------------------------------------------------------------- SuperBill Details Patient Name: Date of Service: Elray Buba A. 07/07/2022 Medical Record Number: [id number] Patient Account Number: [id number] Date of Birth/Sex: Treating RN: 1961/03/15 (61 y.o. [name] Primary Care Provider: Christiana Fuchs Other Clinician: Referring Provider: Treating Provider/Extender: Doran Stabler in Treatment: 1 Diagnosis Coding ICD-10 Codes Code Description [phone] Type 2 diabetes mellitus with foot ulcer L97.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 exposed L84 Corns and callosities C54.1 Malignant neoplasm of endometrium Facility Procedures CPT4 Code: [id number] Description: 84696 - PARE BENIGN LES; SGL ICD-10 Diagnosis Description L84 Corns and callosities Modifier: Quantity: 1 Physician Procedures : CPT4 Code Description Modifier [id number] 32440 - WC PHYS PARE BENIGN LES; SGL ICD-10 Diagnosis Description L84 Corns and callosities Quantity: 1 Electronic Signature(s) Signed: 07/07/2022 10:24:46 AM By: Kalman Shan Rivera Entered By: Kalman Shan on 07/07/2022 10:19:27

## 2022-07-07 NOTE — Progress Notes (Signed)
[name], [name] ([id number]) Visit Report for 07/07/2022 Arrival Information Details Patient Name: Date of Service: [name], Debra A. 07/07/2022 9:00 [place] Record Number: [id number] Patient Account Number: [id number] Date of Birth/Sex: Treating RN: 12/23/1960 (61 y.o. [name] Primary Care Ryli Standlee: Christiana Fuchs Other Clinician: Referring Dannae Kato: Treating Ikia Cincotta/Extender: Doran Stabler in Treatment: 1 Visit Information History Since Last Visit Added or deleted any medications: No Patient Arrived: Ambulatory Any new allergies or adverse reactions: No Arrival Time: 08:38 Had a fall or experienced change in No Accompanied By: self activities of daily living that may affect Transfer Assistance: None risk of falls: Patient Requires Transmission-Based Precautions: No Signs or symptoms of abuse/neglect since last visito No Patient Has Alerts: No Hospitalized since last visit: No Implantable device outside of the clinic excluding No cellular tissue based products placed in the center since last visit: Pain Present Now: No Electronic Signature(s) Signed: 07/07/2022 4:44:02 PM By: Erenest Blank Entered By: Erenest Blank on 07/07/2022 08:41:50 -------------------------------------------------------------------------------- Encounter Discharge Information Details Patient Name: Date of Service: [name], Debra Boehringer A. 07/07/2022 9:00 [place] Record Number: [id number] Patient Account Number: [id number] Date of Birth/Sex: Treating RN: 1961/05/20 (61 y.o. [name] Primary Care Eulis Salazar: Christiana Fuchs Other Clinician: Referring Greta Yung: Treating Peterson Mathey/Extender: Doran Stabler in Treatment: 1 Encounter Discharge Information Items Discharge Condition: Stable Ambulatory Status: Ambulatory Discharge Destination: Home Transportation: Private Auto Accompanied By: self Schedule Follow-up Appointment:  No Clinical Summary of Care: Notes padded the area on right plantar foot for protection. Electronic Signature(s) Signed: 07/07/2022 5:06:20 PM By: Deon Pilling RN, BSN Entered By: Deon Pilling on 07/07/2022 09:31:31 -------------------------------------------------------------------------------- Lower Extremity Assessment Details Patient Name: Date of Service: [name] A. 07/07/2022 9:00 [place] Record Number: [id number] Patient Account Number: [id number] Date of Birth/Sex: Treating RN: [birth year] (61 y.o. [name] Primary Care Aireana Ryland: Christiana Fuchs Other Clinician: Referring Tamanika Heiney: Treating Olivya Sobol/Extender: Kalman Shan Masters, Katie Weeks in Treatment: 1 Edema Assessment Assessed: [Left: No] [Right: No] E[Left: dema] [Right: :] Calf Left: Right: Point of Measurement: 49 cm From Medial Instep 40.6 cm Ankle Left: Right: Point of Measurement: 36 cm From Medial Instep 23 cm Electronic Signature(s) Signed: 07/07/2022 4:44:02 PM By: Erenest Blank Signed: 07/07/2022 5:06:20 PM By: Deon Pilling RN, BSN Entered By: Erenest Blank on 07/07/2022 08:51:26 -------------------------------------------------------------------------------- Multi Wound Chart Details Patient Name: Date of Service: [name], Debra Boehringer A. 07/07/2022 9:00 A M Medical Record Number: [id number] Patient Account Number: [id number] Date of Birth/Sex: Treating RN: 1961-08-13 (61 y.o. [name] Primary Care Farida Mcreynolds: Christiana Fuchs Other Clinician: Referring Valdez Brannan: Treating Diamantina Edinger/Extender: Doran Stabler in Treatment: 1 Vital Signs Height(in): Pulse(bpm): 111 Weight(lbs): Blood Pressure(mmHg): 129/85 Body Mass Index(BMI): Temperature(F): Respiratory Rate(breaths/min): 18 Photos: [N/A:N/A] Right, Plantar Foot N/A N/A Wound Location: Blister N/A N/A Wounding Event: Diabetic Wound/Ulcer of the Lower N/A N/A Primary  Etiology: Extremity Anemia, Asthma, Hypertension, Type N/A N/A Comorbid History: II Diabetes, Osteomyelitis, Neuropathy, Received Chemotherapy, Received Radiation, Confinement Anxiety 06/16/2022 N/A N/A Date Acquired: 1 N/A N/A Weeks of Treatment: Open N/A N/A Wound Status: No N/A N/A Wound Recurrence: 0x0x0 N/A N/A Measurements L x W x D (cm) 0 N/A N/A A (cm) : rea 0 N/A N/A Volume (cm) : 100.00% N/A N/A % Reduction in A rea: 100.00% N/A N/A % Reduction in Volume: Grade 2 N/A N/A Classification: Medium N/A N/A Exudate A mount: Serosanguineous N/A N/A Exudate Type: red, brown N/A  N/A Exudate Color: Distinct, outline attached N/A N/A Wound Margin: None Present (0%) N/A N/A Granulation A mount: None Present (0%) N/A N/A Necrotic A mount: Fat Layer (Subcutaneous Tissue): Yes N/A N/A Exposed Structures: Fascia: No Tendon: No Muscle: No Joint: No Bone: No Large (67-100%) N/A N/A Epithelialization: Treatment Notes Electronic Signature(s) Signed: 07/07/2022 10:24:46 AM By: Kalman Shan DO Signed: 07/07/2022 5:06:20 PM By: Deon Pilling RN, BSN Entered By: Kalman Shan on 07/07/2022 10:12:02 -------------------------------------------------------------------------------- Multi-Disciplinary Care Plan Details Patient Name: Date of Service: [name], Debra Boehringer A. 07/07/2022 9:00 A M Medical Record Number: [id number] Patient Account Number: [id number] Date of Birth/Sex: Treating RN: 11/17/1961 (61 y.o. [name] Primary Care Lareen Mullings: Christiana Fuchs Other Clinician: Referring Deyonte Cadden: Treating Brianca Fortenberry/Extender: Doran Stabler in Treatment: 1 Active Inactive Electronic Signature(s) Signed: 07/07/2022 5:06:20 PM By: Deon Pilling RN, BSN Entered By: Deon Pilling on 07/07/2022 09:29:38 -------------------------------------------------------------------------------- Pain Assessment Details Patient Name: Date of  Service: [name] A. 07/07/2022 9:00 [place] Record Number: [id number] Patient Account Number: [id number] Date of Birth/Sex: Treating RN: Jul 30, 1961 (61 y.o. [name] Primary Care Suheyla Mortellaro: Christiana Fuchs Other Clinician: Referring Maureena Dabbs: Treating Monico Sudduth/Extender: Doran Stabler in Treatment: 1 Active Problems Location of Pain Severity and Description of Pain Patient Has Paino No Site Locations Pain Management and Medication Current Pain Management: Electronic Signature(s) Signed: 07/07/2022 4:44:02 PM By: Erenest Blank Signed: 07/07/2022 5:06:20 PM By: Deon Pilling RN, BSN Entered By: Erenest Blank on 07/07/2022 08:54:03 -------------------------------------------------------------------------------- Patient/Caregiver Education Details Patient Name: Date of Service: [name] 8/10/2023andnbsp9:00 [place] Record Number: [id number] Patient Account Number: [id number] Date of Birth/Gender: Treating RN: 02/04/1961 (61 y.o. [name] Primary Care Physician: Christiana Fuchs Other Clinician: Referring Physician: Treating Physician/Extender: Doran Stabler in Treatment: 1 Education Assessment Education Provided To: Patient Education Topics Provided Welcome T [facility] [place]: o Handouts: Welcome T [facility] [name] o Methods: Explain/Verbal Responses: Reinforcements needed Electronic Signature(s) Signed: 07/07/2022 5:06:20 PM By: Deon Pilling RN, BSN Entered By: Deon Pilling on 07/07/2022 09:13:53 -------------------------------------------------------------------------------- Wound Assessment Details Patient Name: Date of Service: [name], Debra Boehringer A. 07/07/2022 9:00 [place] Record Number: [id number] Patient Account Number: [id number] Date of Birth/Sex: Treating RN: November 14, 1961 (61 y.o. [name] Primary Care Fadia Marlar: Christiana Fuchs Other  Clinician: Referring Ramar Nobrega: Treating Gabriele Loveland/Extender: Doran Stabler in Treatment: 1 Wound Status Wound Number: 16 Primary Diabetic Wound/Ulcer of the Lower Extremity Etiology: Wound Location: Right, Plantar Foot Wound Open Wounding Event: Blister Status: Date Acquired: 06/16/2022 Comorbid Anemia, Asthma, Hypertension, Type II Diabetes, Osteomyelitis, Weeks Of Treatment: 1 History: Neuropathy, Received Chemotherapy, Received Radiation, Clustered Wound: No Confinement Anxiety Photos Wound Measurements Length: (cm) Width: (cm) Depth: (cm) Area: (cm) Volume: (cm) 0 % Reduction in Area: 100% 0 % Reduction in Volume: 100% 0 Epithelialization: Large (67-100%) 0 Tunneling: No 0 Undermining: No Wound Description Classification: Grade 2 Wound Margin: Distinct, outline attached Exudate Amount: Medium Exudate Type: Serosanguineous Exudate Color: red, brown Foul Odor After Cleansing: No Slough/Fibrino No Wound Bed Granulation Amount: None Present (0%) Exposed Structure Necrotic Amount: None Present (0%) Fascia Exposed: No Fat Layer (Subcutaneous Tissue) Exposed: Yes Tendon Exposed: No Muscle Exposed: No Joint Exposed: No Bone Exposed: No Electronic Signature(s) Signed: 07/07/2022 4:44:02 PM By: Erenest Blank Signed: 07/07/2022 5:06:20 PM By: Deon Pilling RN, BSN Entered By: Erenest Blank on 07/07/2022 08:56:01 -------------------------------------------------------------------------------- [place] Details Patient Name: Date of Service: Debra  Lynnae Rivera, Debra A. 07/07/2022 9:00 A M Medical Record Number: [id number] Patient Account Number: [id number] Date of Birth/Sex: Treating RN: 01-23-1961 (61 y.o. [name] Primary Care Kden Wagster: Christiana Fuchs Other Clinician: Referring Reign Bartnick: Treating Akili Cuda/Extender: Doran Stabler in Treatment: 1 Vital Signs Time Taken: 08:42 Pulse (bpm): 111 Respiratory Rate  (breaths/min): 18 Blood Pressure (mmHg): 129/85 Reference Range: 80 - 120 mg / dl Electronic Signature(s) Signed: 07/07/2022 4:44:02 PM By: Erenest Blank Entered By: Erenest Blank on 07/07/2022 08:47:01

## 2022-07-07 NOTE — Assessment & Plan Note (Signed)
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 Patient presents today after a 3-day hospitalization for syncope and hypotension. Patient's presentation was initially thought to be due to septic shock requiring a brief ICU stay on broad-spectrum IV antibiotics however no infectious etiology was identified. Blood pressure improved with IV fluids and a short on Levophed.  Patient was discharged home with SBP in the 110s to 120 and instructed to closely monitor BP at home. Today, patient states her appetite worsened after she resumed her Ozempic at home so she discontinued it with improvement in her appetite.  Patient also reports occasional episodes of dizziness at home especially after standing up too quickly. Patient did drop again SBP from 111 to 96 and HR from 92 to 112 upon standing. This is consistent with hypovolemia in the setting of poor p.o. intake. CBC shows hemoglobin improved to 9.8 from 8.9 on day of discharge.  CMP shows normal kidney function with no electrolyte abnormalities patient advised to stay hydrated by drinking at least 2 L of water daily. Will supplement appetite with protein shake.  Plan: -Advised to increase fluid intake -Blood pressure monitoring device to check BP at home -Ensure protein shake between meals

## 2022-07-07 NOTE — Assessment & Plan Note (Signed)
Patient has a small healing wound/callus under R big toe.  Occasionally tender but no drainage.  Follows up with wound care.  States her oncologist thinks this is likely due to her chemotherapy.  States she has been referred to a podiatrist. -Continue to monitor closely -Continue to follow wound care

## 2022-07-08 ENCOUNTER — Telehealth: Payer: Self-pay | Admitting: *Deleted

## 2022-07-08 NOTE — Progress Notes (Signed)
  Radiation Oncology         [phone] ________________________________  Name: [name] MRN: [id number]  Date: 07/11/2022  DOB: [birth year]  CC: Masters, [name], DO  [name], MD  HDR BRACHYTHERAPY NOTE  DIAGNOSIS: The encounter diagnosis was 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[facility]).   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 She will be treated with a 3.0 cm diameter segmented cylinder. This conforms to her anatomy without undue discomfort.  Vaginal brachytherapy procedure node: The patient was brought to the [place] suite. Identity was confirmed. All relevant records and images related to the planned course of therapy were reviewed. The patient freely provided informed written consent to proceed with treatment after reviewing the details related to the planned course of therapy. The consent form was witnessed and verified by the simulation staff. Then, the patient was set-up in a stable reproducible supine position for radiation therapy. Pelvic exam revealed the vaginal cuff to be intact . The patient's custom vaginal cylinder was placed in the proximal vagina. This was affixed to the CT/MR stabilization plate to prevent slippage. Patient tolerated the placement well.  Verification simulation note:  A fiducial marker was placed within the vaginal cylinder. An AP and lateral film was then obtained through the pelvis area. This documented accurate position of the vaginal cylinder for treatment.  HDR BRACHYTHERAPY TREATMENT  The remote afterloading device was affixed to the vaginal cylinder by catheter. Patient then proceeded to  undergo her fifth high-dose-rate treatment directed at the proximal vagina. The patient was prescribed a dose of 6.0 gray to be delivered to the mucosal surface. Treatment length was 3.0 cm. Patient was treated with 1 channel using 7 dwell positions. Treatment time was 205.8 seconds. Iridium 192 was the high-dose-rate source for treatment. The patient tolerated the treatment well. After completion of her therapy, a radiation survey was performed documenting return of the iridium source into the GammaMed safe.   PLAN: The patient has completed her fifth and final high-dose-rate treatment. Overall, she tolerated treatment well with minimal side effects . She will return in one month for routine follow-up.  She has 2 more cycles of chemotherapy to complete her adjuvant treatment. ________________________________    Blair Promise, PhD, MD  This document serves as a record of services personally performed by Gery Pray, MD. It was created on his behalf by Roney Mans, a trained medical scribe. The creation of this record is based on the scribe's personal observations and the provider's statements to them. This document has been checked and approved by the attending provider.

## 2022-07-08 NOTE — Progress Notes (Signed)
Internal Medicine Clinic Attending  Case discussed with Dr. [place]  At the time of the visit.  We reviewed the resident's history and exam and pertinent patient test results.  I agree with the assessment, diagnosis, and plan of care documented in the resident's note.  

## 2022-07-08 NOTE — Telephone Encounter (Signed)
CALLED PATIENT TO REMIND OF HDR TX,. FOR 07-11-22 @ 9 AM , UNABLE TO LEAVE MESSAGE DUE TO VM NOT BEING SET UP

## 2022-07-11 ENCOUNTER — Encounter: Payer: Self-pay | Admitting: Radiation Oncology

## 2022-07-11 ENCOUNTER — Other Ambulatory Visit: Payer: Self-pay

## 2022-07-11 ENCOUNTER — Ambulatory Visit
Admission: RE | Admit: 2022-07-11 | Discharge: 2022-07-11 | Disposition: A | Discharge status: Home / Self Care | Payer: Medicaid Other | Source: Ambulatory Visit | Attending: Radiation Oncology | Admitting: Radiation Oncology

## 2022-07-11 DIAGNOSIS — C541 Malignant neoplasm of endometrium: Secondary | ICD-10-CM | POA: Diagnosis not present

## 2022-07-11 LAB — RAD ONC ARIA SESSION SUMMARY
Course Elapsed Days: 28
Plan Fractions Treated to Date: 5
Plan Prescribed Dose Per Fraction: 6 Gy
Plan Total Fractions Prescribed: 5
Plan Total Prescribed Dose: 30 Gy
Reference Point Dosage Given to Date: 30 Gy
Reference Point Session Dosage Given: 6 Gy
Session Number: 5

## 2022-07-12 ENCOUNTER — Telehealth: Payer: Self-pay

## 2022-07-12 NOTE — Telephone Encounter (Signed)
Pt called stating she is to follow up with Dr.Tucker after she finishes radiation and chemo. Last date radiation was yesterday 8/14. Last Chemo is 9/21. Per Joylene John NP pt can be scheduled 1 month after last chemo.   Pt is scheduled for follow up with Dr.Tucker on 09/20/22 @ 3:00pm. Pt ok with date and time of appointment.

## 2022-07-13 ENCOUNTER — Other Ambulatory Visit: Payer: Self-pay

## 2022-07-13 ENCOUNTER — Ambulatory Visit: Payer: Medicaid Other | Admitting: Podiatry

## 2022-07-13 ENCOUNTER — Encounter: Payer: Self-pay | Admitting: Podiatry

## 2022-07-13 DIAGNOSIS — B351 Tinea unguium: Secondary | ICD-10-CM | POA: Diagnosis not present

## 2022-07-13 DIAGNOSIS — E1140 Type 2 diabetes mellitus with diabetic neuropathy, unspecified: Secondary | ICD-10-CM

## 2022-07-13 DIAGNOSIS — M79674 Pain in right toe(s): Secondary | ICD-10-CM

## 2022-07-13 DIAGNOSIS — M2041 Other hammer toe(s) (acquired), right foot: Secondary | ICD-10-CM

## 2022-07-13 DIAGNOSIS — M2042 Other hammer toe(s) (acquired), left foot: Secondary | ICD-10-CM

## 2022-07-13 DIAGNOSIS — M79675 Pain in left toe(s): Secondary | ICD-10-CM | POA: Diagnosis not present

## 2022-07-13 DIAGNOSIS — Z87898 Personal history of other specified conditions: Secondary | ICD-10-CM

## 2022-07-13 DIAGNOSIS — L84 Corns and callosities: Secondary | ICD-10-CM | POA: Diagnosis not present

## 2022-07-13 NOTE — Progress Notes (Signed)
  Subjective:  [name], female    DOB: September 25, 1961,   MRN: [id number]  Chief Complaint  Patient presents with   Diabetes    DFC A1C - 5.8  FBS - 220 PCP - 2 weeks ago  Patient would like orthotics , patient has a history of ulcers and callus     61 y.o. female presents for concern of diabetic foot check and history of calluses and ulcer. concern of thickened elongated and pain history of ulcerations that she has been following in wound care and relates she recently healed her ulcers. Relates burning and tingling in their feet. Patient is diabetic and last A1c was  Lab Results  Component Value Date   HGBA1C 5.7 (H) 06/22/2022   .   PCP:  Christiana Fuchs, DO    . Denies any other pedal complaints. Denies n/v/f/c.   Past Medical History:  Diagnosis Date   Anemia    Asthma 11/01/2010   only when respiratory infections   BPPV (benign paroxysmal positional vertigo) 03/07/2013   Diabetes mellitus    since 1983   Elevated TSH 05/13/2013   Endometrial cancer ([place])    GERD (gastroesophageal reflux disease)    Hypertension    Joint pain 05/26/2022   Left elbow pain 05/05/2016   Left shoulder pain 05/05/2016   Osteomyelitis ([place])    Pneumonia    as a child   Wrist pain 04/11/2013    Objective:  Physical Exam: Vascular: DP/PT pulses 2/4 bilateral. CFT <3 seconds. Absent hair growth on digits. Edema noted to bilateral lower extremities. Xerosis noted bilaterally.  Skin. No lacerations or abrasions bilateral feet. Nails 1-5 bilateral  are thickened discolored and elongated with subungual debris. Hyperkeratotic lesions noted sub third metatarsal bilateral.  Musculoskeletal: MMT 5/5 bilateral lower extremities in DF, PF, Inversion and Eversion. Deceased ROM in DF of ankle joint. Left first and second digit amputations. Hammertoes to remaining digits bilateral.  Neurological: Sensation intact to light touch. Protective sensation diminished bilateral.    Assessment:    1. Controlled type 2 diabetes with neuropathy ([place])   2. History of ulceration      Plan:  Patient was evaluated and treated and all questions answered. -Discussed and educated patient on diabetic foot care, especially with  regards to the vascular, neurological and musculoskeletal systems.  -Stressed the importance of good glycemic control and the detriment of not  controlling glucose levels in relation to the foot. -Discussed supportive shoes at all times and checking feet regularly.  -Mechanically debrided all nails 1-5 bilateral using sterile nail nipper and filed with dremel without incident  -Hyperkeratotic tissue debrided without incident with #15 blade.  -Answered all patient questions -Patient interested in DM shoes and will call back in September to get fitted for DM shoes.  -Patient to return  in 3 months for at risk foot care -Patient advised to call the office if any problems or questions arise in the meantime.   Lorenda Peck, DPM

## 2022-07-14 ENCOUNTER — Other Ambulatory Visit: Payer: Self-pay | Admitting: Internal Medicine

## 2022-07-14 ENCOUNTER — Other Ambulatory Visit: Payer: Self-pay

## 2022-07-14 DIAGNOSIS — G4700 Insomnia, unspecified: Secondary | ICD-10-CM

## 2022-07-18 ENCOUNTER — Inpatient Hospital Stay: Payer: Medicaid Other

## 2022-07-18 ENCOUNTER — Inpatient Hospital Stay: Payer: Medicaid Other | Admitting: Hematology and Oncology

## 2022-07-25 ENCOUNTER — Inpatient Hospital Stay: Payer: Medicaid Other | Admitting: Hematology and Oncology

## 2022-07-25 ENCOUNTER — Inpatient Hospital Stay: Payer: Medicaid Other

## 2022-07-27 MED FILL — Fosaprepitant Dimeglumine For IV Infusion 150 MG (Base Eq): INTRAVENOUS | Qty: 5 | Status: AC

## 2022-07-27 MED FILL — Dexamethasone Sodium Phosphate Inj 100 MG/10ML: INTRAMUSCULAR | Qty: 1 | Status: AC

## 2022-07-28 ENCOUNTER — Other Ambulatory Visit: Payer: Self-pay

## 2022-07-28 ENCOUNTER — Inpatient Hospital Stay: Payer: Medicaid Other

## 2022-07-28 ENCOUNTER — Encounter: Payer: Self-pay | Admitting: Hematology and Oncology

## 2022-07-28 ENCOUNTER — Inpatient Hospital Stay (HOSPITAL_BASED_OUTPATIENT_CLINIC_OR_DEPARTMENT_OTHER): Payer: Medicaid Other | Admitting: Hematology and Oncology

## 2022-07-28 DIAGNOSIS — D61818 Other pancytopenia: Secondary | ICD-10-CM

## 2022-07-28 DIAGNOSIS — E1340 Other specified diabetes mellitus with diabetic neuropathy, unspecified: Secondary | ICD-10-CM | POA: Diagnosis not present

## 2022-07-28 DIAGNOSIS — C541 Malignant neoplasm of endometrium: Secondary | ICD-10-CM

## 2022-07-28 DIAGNOSIS — Z5111 Encounter for antineoplastic chemotherapy: Secondary | ICD-10-CM | POA: Diagnosis not present

## 2022-07-28 LAB — CBC WITH DIFFERENTIAL (CANCER CENTER ONLY)
Abs Immature Granulocytes: 0.01 10*3/uL (ref 0.00–0.07)
Basophils Absolute: 0 10*3/uL (ref 0.0–0.1)
Basophils Relative: 0 %
Eosinophils Absolute: 0 10*3/uL (ref 0.0–0.5)
Eosinophils Relative: 0 %
HCT: 27.7 % — ABNORMAL LOW (ref 36.0–46.0)
Hemoglobin: 9.1 g/dL — ABNORMAL LOW (ref 12.0–15.0)
Immature Granulocytes: 0 %
Lymphocytes Relative: 26 %
Lymphs Abs: 1 10*3/uL (ref 0.7–4.0)
MCH: 31.4 pg (ref 26.0–34.0)
MCHC: 32.9 g/dL (ref 30.0–36.0)
MCV: 95.5 fL (ref 80.0–100.0)
Monocytes Absolute: 0.1 10*3/uL (ref 0.1–1.0)
Monocytes Relative: 4 %
Neutro Abs: 2.7 10*3/uL (ref 1.7–7.7)
Neutrophils Relative %: 70 %
Platelet Count: 148 10*3/uL — ABNORMAL LOW (ref 150–400)
RBC: 2.9 MIL/uL — ABNORMAL LOW (ref 3.87–5.11)
RDW: 17.7 % — ABNORMAL HIGH (ref 11.5–15.5)
WBC Count: 3.8 10*3/uL — ABNORMAL LOW (ref 4.0–10.5)
nRBC: 0 % (ref 0.0–0.2)

## 2022-07-28 LAB — CMP (CANCER CENTER ONLY)
ALT: 18 U/L (ref 0–44)
AST: 17 U/L (ref 15–41)
Albumin: 4.2 g/dL (ref 3.5–5.0)
Alkaline Phosphatase: 90 U/L (ref 38–126)
Anion gap: 7 (ref 5–15)
BUN: 14 mg/dL (ref 6–20)
CO2: 26 mmol/L (ref 22–32)
Calcium: 9.3 mg/dL (ref 8.9–10.3)
Chloride: 107 mmol/L (ref 98–111)
Creatinine: 0.65 mg/dL (ref 0.44–1.00)
GFR, Estimated: >60 mL/min (ref >60)
Glucose, Bld: 231 mg/dL — ABNORMAL HIGH (ref 70–99)
Potassium: 4.7 mmol/L (ref 3.5–5.1)
Sodium: 140 mmol/L (ref 135–145)
Total Bilirubin: 0.3 mg/dL (ref 0.3–1.2)
Total Protein: 7.3 g/dL (ref 6.5–8.1)

## 2022-07-28 MED ORDER — SODIUM CHLORIDE 0.9 % IV SOLN
10.0000 mg | Freq: Once | INTRAVENOUS | Status: AC
  Administered 2022-07-28: 10 mg via INTRAVENOUS
  Filled 2022-07-28: qty 10

## 2022-07-28 MED ORDER — SODIUM CHLORIDE 0.9% FLUSH
10.0000 mL | INTRAVENOUS | Status: DC | PRN
  Administered 2022-07-28: 10 mL

## 2022-07-28 MED ORDER — FAMOTIDINE IN NACL 20-0.9 MG/50ML-% IV SOLN
20.0000 mg | Freq: Once | INTRAVENOUS | Status: AC
  Administered 2022-07-28: 20 mg via INTRAVENOUS
  Filled 2022-07-28: qty 50

## 2022-07-28 MED ORDER — HEPARIN SOD (PORK) LOCK FLUSH 100 UNIT/ML IV SOLN
500.0000 [IU] | Freq: Once | INTRAVENOUS | Status: AC | PRN
  Administered 2022-07-28: 500 [IU]

## 2022-07-28 MED ORDER — SODIUM CHLORIDE 0.9 % IV SOLN: Freq: Once | INTRAVENOUS | Status: AC

## 2022-07-28 MED ORDER — PALONOSETRON HCL INJECTION 0.25 MG/5ML
0.2500 mg | Freq: Once | INTRAVENOUS | Status: AC
  Administered 2022-07-28: 0.25 mg via INTRAVENOUS
  Filled 2022-07-28: qty 5

## 2022-07-28 MED ORDER — SODIUM CHLORIDE 0.9 % IV SOLN
150.0000 mg | Freq: Once | INTRAVENOUS | Status: AC
  Administered 2022-07-28: 150 mg via INTRAVENOUS
  Filled 2022-07-28: qty 150

## 2022-07-28 MED ORDER — SODIUM CHLORIDE 0.9 % IV SOLN
720.0000 mg | Freq: Once | INTRAVENOUS | Status: AC
  Administered 2022-07-28: 720 mg via INTRAVENOUS
  Filled 2022-07-28: qty 72

## 2022-07-28 MED ORDER — TRAMADOL HCL 50 MG PO TABS: 100.0000 mg | ORAL_TABLET | Freq: Four times a day (QID) | ORAL | 0 refills | Status: DC | PRN

## 2022-07-28 MED ORDER — SODIUM CHLORIDE 0.9% FLUSH
10.0000 mL | Freq: Once | INTRAVENOUS | Status: AC
  Administered 2022-07-28: 10 mL

## 2022-07-28 MED ORDER — DIPHENHYDRAMINE HCL 50 MG/ML IJ SOLN
25.0000 mg | Freq: Once | INTRAMUSCULAR | Status: AC
  Administered 2022-07-28: 25 mg via INTRAVENOUS
  Filled 2022-07-28: qty 1

## 2022-07-28 MED ORDER — SODIUM CHLORIDE 0.9 % IV SOLN
105.0000 mg/m2 | Freq: Once | INTRAVENOUS | Status: AC
  Administered 2022-07-28: 264 mg via INTRAVENOUS
  Filled 2022-07-28: qty 44

## 2022-07-28 NOTE — Assessment & Plan Note (Signed)
She has slight worsening pancytopenia due to treatment Plan dose adjustment as above

## 2022-07-28 NOTE — Assessment & Plan Note (Signed)
She has different signs and symptoms of hand cramping which I believe is a sign of neuropathy from her treatment and or diabetic induced peripheral neuropathy I plan slight further dose reduction of Taxol She will continue aggressive management of diabetes and she will continue taking her pain medicine and gabapentin

## 2022-07-28 NOTE — Patient Instructions (Signed)
[facility] MEDICAL [facility]COLOGY  Discharge Instructions: Thank you for choosing [facility] to provide your oncology and hematology care.   If you have a lab appointment with the Cancer Center, please go directly to the Cancer Center and check in at the registration area.   Wear comfortable clothing and clothing appropriate for easy access to any Portacath or PICC line.   We strive to give you quality time with your provider. You may need to reschedule your appointment if you arrive late (15 or more minutes).  Arriving late affects you and other patients whose appointments are after yours.  Also, if you miss three or more appointments without notifying the office, you may be dismissed from the clinic at the provider's discretion.      For prescription refill requests, have your pharmacy contact our office and allow 72 hours for refills to be completed.    Today you received the following chemotherapy and/or immunotherapy agents: paclitaxel and carboplatin      To help prevent nausea and vomiting after your treatment, we encourage you to take your nausea medication as directed.  BELOW ARE SYMPTOMS THAT SHOULD BE REPORTED IMMEDIATELY: *FEVER GREATER THAN 100.4 F (38 C) OR HIGHER *CHILLS OR SWEATING *NAUSEA AND VOMITING THAT IS NOT CONTROLLED WITH YOUR NAUSEA MEDICATION *UNUSUAL SHORTNESS OF BREATH *UNUSUAL BRUISING OR BLEEDING *URINARY PROBLEMS (pain or burning when urinating, or frequent urination) *BOWEL PROBLEMS (unusual diarrhea, constipation, pain near the anus) TENDERNESS IN MOUTH AND THROAT WITH OR WITHOUT PRESENCE OF ULCERS (sore throat, sores in mouth, or a toothache) UNUSUAL RASH, SWELLING OR PAIN  UNUSUAL VAGINAL DISCHARGE OR ITCHING   Items with * indicate a potential emergency and should be followed up as soon as possible or go to the Emergency Department if any problems should occur.  Please show the CHEMOTHERAPY ALERT CARD or IMMUNOTHERAPY ALERT  CARD at check-in to the Emergency Department and triage nurse.  Should you have questions after your visit or need to cancel or reschedule your appointment, please contact [facility]  Dept: [phone]  and follow the prompts.  Office hours are 8:00 a.m. to 4:30 p.m. Monday - Friday. Please note that voicemails left after 4:00 p.m. may not be returned until the following business day.  We are closed weekends and major holidays. You have access to a nurse at all times for urgent questions. Please call the main number to the clinic Dept: [phone] and follow the prompts.   For any non-urgent questions, you may also contact your provider using MyChart. We now offer e-Visits for anyone 18 and older to request care online for non-urgent symptoms. For details visit [email or web address].   Also download the MyChart app! Go to the app store, search "MyChart", open the app, select [facility], and log in with your MyChart username and password.  Masks are optional in the cancer centers. If you would like for your care team to wear a mask while they are taking care of you, please let them know. You may have one support person who is at least 60 years old accompany you for your appointments. 

## 2022-07-28 NOTE — Progress Notes (Signed)
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[facility]) She has signs and symptoms of slight progression of neuropathy We will proceed with treatment today with further changes to her Taxol dose   Pancytopenia, acquired [facility]) She has slight worsening pancytopenia due to treatment Plan dose adjustment as above  Neuropathy due to secondary diabetes [facility]) She has different signs and symptoms of hand cramping which I believe is a sign of neuropathy from her treatment and or diabetic induced peripheral neuropathy I plan slight further dose reduction of Taxol She will continue aggressive management of diabetes and she will continue taking her pain medicine and gabapentin  No orders of the defined types were placed in this encounter.   All questions were answered. The patient knows to call the clinic with any problems, questions or concerns. The total time spent in the appointment was 30 minutes encounter with patients including review of chart and various tests results, discussions about plan of care and coordination of care plan   Debra Lark, MD 07/28/2022 11:30 AM  INTERVAL HISTORY: Please see below for problem oriented charting. she returns for treatment follow-up seen prior to cycle 5 of treatment She has slight worsening cramping of her hands from treatment Her pre-existing peripheral neuropathy in the feet were about the same She is attempting to drink lots of fluids Her blood sugar fluctuate up and down Tramadol was helpful to control her symptoms  REVIEW OF SYSTEMS:   Constitutional: Denies fevers, chills or abnormal weight loss Eyes: Denies blurriness of vision Ears, nose, mouth, throat, and face: Denies mucositis or sore throat Respiratory: Denies cough, dyspnea or wheezes Cardiovascular: Denies palpitation, chest discomfort or lower extremity  swelling Gastrointestinal:  Denies nausea, heartburn or change in bowel habits Skin: Denies abnormal skin rashes Behavioral/Psych: Mood is stable, no new changes  All other systems were reviewed with the patient and are negative.  I have reviewed the past medical history, past surgical history, social history and family history with the patient and they are unchanged from previous note.  ALLERGIES:  is allergic to nsaids and penicillins.  MEDICATIONS:  Current Outpatient Medications  Medication Sig Dispense Refill   acetaminophen (TYLENOL) 650 MG CR tablet Take 1,300-1,950 mg by mouth every 8 (eight) hours as needed for pain.     atorvastatin (LIPITOR) 80 MG tablet Take 1 tablet (80 mg total) by mouth daily. 90 tablet 3   Blood Pressure Monitoring (BLOOD PRESSURE KIT) DEVI Use to check blood pressure daily 1 each 0   dexamethasone (DECADRON) 4 MG tablet Take 2 tabs at the night before chemotherapy, every 3 weeks, by mouth x 6 cycles 12 tablet 6   gabapentin (NEURONTIN) 300 MG capsule Take 3 capsules (900 mg total) by mouth 3 (three) times daily. 270 capsule 6   insulin glargine (LANTUS SOLOSTAR) 100 UNIT/ML Solostar Pen Inject 30 Units into the skin at bedtime. 9 mL 2   insulin lispro (HUMALOG) 100 UNIT/ML injection Inject 0.07 mLs (7 Units total) into the skin 3 (three) times daily with meals. 10 mL 3   lidocaine-prilocaine (EMLA) cream Apply to affected area once 30 g 3   lisinopril (ZESTRIL) 20 MG tablet Take 1 tablet (20 mg total) by mouth daily. 30 tablet 11   meclizine (ANTIVERT) 25 MG tablet TAKE 1 TABLET BY MOUTH THREE TIMES DAILY AS NEEDED FOR DIZZINESS 60 tablet 0  metFORMIN (GLUCOPHAGE-XR) 500 MG 24 hr tablet Take 4 tablets (2,000 mg total) by mouth at bedtime. 360 tablet 3   Multiple Vitamin (MULTIVITAMIN WITH MINERALS) TABS tablet Take 1 tablet by mouth every evening.      Nutritional Supplements ([place]) LIQD Take 1 bottle twice daily between meals 237 mL  20   omeprazole (PRILOSEC) 20 MG capsule Take 1 capsule (20 mg total) by mouth daily. (Patient taking differently: Take 20 mg by mouth daily as needed (acid reflux).) 30 capsule 1   ondansetron (ZOFRAN) 8 MG tablet Take 1 tablet (8 mg total) by mouth 2 (two) times daily as needed. Start on the third day after chemotherapy. 30 tablet 1   prochlorperazine (COMPAZINE) 10 MG tablet Take 1 tablet (10 mg total) by mouth every 6 (six) hours as needed (Nausea or vomiting). 30 tablet 1   Semaglutide, 2 MG/DOSE, 8 MG/3ML SOPN Inject 2 mg into the skin once a week. 10 mL 3   traMADol (ULTRAM) 50 MG tablet Take 2 tablets (100 mg total) by mouth every 6 (six) hours as needed for severe pain. 60 tablet 0   traZODone (DESYREL) 50 MG tablet TAKE 1 TABLET BY MOUTH AT BEDTIME AS NEEDED FOR SLEEP 30 tablet 2   No current facility-administered medications for this visit.   Facility-Administered Medications Ordered in Other Visits  Medication Dose Route Frequency Provider Last Rate Last Admin   CARBOplatin (PARAPLATIN) 720 mg in sodium chloride 0.9 % 250 mL chemo infusion  720 mg Intravenous Once Alvy Bimler, Johnney Scarlata, MD       heparin lock flush 100 unit/mL  500 Units Intracatheter Once PRN Alvy Bimler, Mckynzie Liwanag, MD       PACLitaxel (TAXOL) 264 mg in sodium chloride 0.9 % 250 mL chemo infusion (> 78m/m2)  105 mg/m2 (Treatment Plan Recorded) Intravenous Once Yanci Bachtell, MD       sodium chloride flush (NS) 0.9 % injection 10 mL  10 mL Intracatheter PRN GHeath Lark MD        SUMMARY OF ONCOLOGIC HISTORY: Oncology History Overview Note  P53 wild type   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([place]  02/01/2022 Initial Diagnosis   She presented with postmenopausal bleeding   02/07/2022 Imaging   UKoreapelvis Thickened heterogeneous endometrium with masslike region with internal vascularity. Findings may be associated with endometrial carcinoma or hyperplasia. Biopsy is recommended for further evaluation.   02/22/2022 Initial Biopsy   EMB:  MMMT/carcinosarcoma (predominance of sarcomatous component)   03/05/2022 Imaging   Ct chest, abdomen and pelvis 1. Heterogeneous enlargement of the endometrial cavity is compatible with the reported history of uterine carcinosarcoma. No definite involvement of the posterior bladder wall or sigmoid colon/rectum. 2. 9 mm short axis right external iliac node is upper normal for size. Attention on follow-up recommended. Otherwise no lymphadenopathy in the abdomen or pelvis. 3. 3.6 x 2.7 cm lesion in the lateral segment left liver has subtle peripheral nodular enhancement. This is almost certainly a benign cavernous hemangioma, but MRI abdomen with and without contrast recommended to confirm. 4. Aortic Atherosclerosis (ICD10-I70.0).   03/08/2022 Initial Diagnosis   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([place]   03/30/2022 Surgery   TRH/BSO, SLN biopsy on right, left pelvic LND, LOS, mini-lap  Findings: On EUA, 12cm minimally mobile uterus. On intra-abdominal exam, normal upper abdominal survey. Omentum adherent to the anterior abdominal wall along the prior midline incision. Normal omentum otherwise, normal small and large bowel. 12 cm uterus densely adherent to the anterior abdominal wall, obliterating  some of the anterior anatomy including the anterior cul de sac. Normal appearing bilateral adnexa. Mapping successful to right obturator SLN, mildly enlarged. Dye seen within the parametrium on the left, no SLNs identified. No obvious adenopathy on the left. Decision made given length of surgery, comorbitidies to defer left para-aortic lymphadenectomy. Mini-lap required for specimen delivery. Dome intact on cystoscopy and good efflux noted from bilateral ureteral orifices.   03/30/2022 Pathology Results   MMMT/carcinosarcoma, 6.3 cm MI 1cm or 3 (<50%) Cervical stroma, bilateral tubes/ovaries benign R SLN and L pelvic LNDs negative  ONCOLOGY TABLE:   UTERUS, CARCINOMA OR CARCINOSARCOMA: Resection   Procedure:  Total hysterectomy and bilateral salpingo-oophorectomy  Histologic Type:  Malignant mixed Mullerian tumor (MMMT/ carcinosarcoma)  Histologic Grade: High-grade  Myometrial Invasion:       Depth of Myometrial Invasion (mm): 10 mm       Myometrial Thickness (mm): 30 mm       Percentage of Myometrial Invasion: 33%  Uterine Serosa Involvement: Not identified  Cervical stromal Involvement: Not identified  Extent of involvement of other tissue/organs: Not identified  Peritoneal/Ascitic Fluid: Not applicable  Lymphovascular Invasion: Not identified  Regional Lymph Nodes:       Pelvic Lymph Nodes Examined:                                   1 Sentinel                                   6 Non-sentinel                                   7 Total       Pelvic Lymph Nodes with Metastasis: 0                           Macrometastasis: (>2.0 mm): 0                           Micrometastasis: (>0.2 mm and < 2.0 mm): 0                           Isolated Tumor Cells (<0.2 mm): 0                           Laterality of Lymph Node with Tumor: Not  applicable                           Extracapsular Extension: Not applicable       Para-aortic Lymph Nodes Examined:                                    0 Sentinel                                    0 non-sentinel  0 total  Distant Metastasis:       Distant Site(s) Involved: Not applicable  Pathologic Stage Classification (pTNM, AJCC 8th Edition): pT1a, pN0  Ancillary Studies: MMR / MSI testing will be ordered  Representative Tumor Block: B1  Comment(s): Pancytokeratin was performed on the lymph nodes and is  negative.    04/08/2022 Cancer Staging   Staging form: Corpus Uteri - Carcinoma and Carcinosarcoma, AJCC 8th Edition - Pathologic stage from 04/08/2022: FIGO Stage IA (pT1a, pN0, cM0) - Signed by Debra Lark, MD on 04/08/2022 Stage prefix: Initial diagnosis   04/21/2022 Procedure   Placement of single lumen port a cath via  right internal jugular vein. The catheter tip lies at the cavo-atrial junction. A power injectable port a cath was placed and is ready for immediate use.       04/29/2022 -  Chemotherapy   Patient is on Treatment Plan : UTERINE Carboplatin AUC 6 / Paclitaxel q21d       PHYSICAL EXAMINATION: ECOG PERFORMANCE STATUS: 1 - Symptomatic but completely ambulatory  Vitals:   07/28/22 0949  BP: 134/68  Pulse: 94  Resp: 18  Temp: 98.5 F (36.9 C)  SpO2: 100%   Filed Weights   07/28/22 0949  Weight: 267 lb 3.2 oz (121.2 kg)    GENERAL:alert, no distress and comfortable NEURO: alert & oriented x 3 with fluent speech, no focal motor/sensory deficits  LABORATORY DATA:  I have reviewed the data as listed    Component Value Date/Time   NA 140 07/28/2022 0901   NA 139 09/22/2021 0954   K 4.7 07/28/2022 0901   CL 107 07/28/2022 0901   CO2 26 07/28/2022 0901   GLUCOSE 231 (H) 07/28/2022 0901   BUN 14 07/28/2022 0901   BUN 13 09/22/2021 0954   CREATININE 0.65 07/28/2022 0901   CREATININE 0.67 04/23/2015 1645   CALCIUM 9.3 07/28/2022 0901   PROT 7.3 07/28/2022 0901   PROT 7.3 09/22/2021 0954   ALBUMIN 4.2 07/28/2022 0901   ALBUMIN 4.4 09/22/2021 0954   AST 17 07/28/2022 0901   ALT 18 07/28/2022 0901   ALKPHOS 90 07/28/2022 0901   BILITOT 0.3 07/28/2022 0901   GFRNONAA >60 07/28/2022 0901   GFRNONAA >89 01/06/2014 1643   GFRAA >60 02/25/2020 0334   GFRAA >89 01/06/2014 1643    No results found for: "SPEP", "UPEP"  Lab Results  Component Value Date   WBC 3.8 (L) 07/28/2022   NEUTROABS 2.7 07/28/2022   HGB 9.1 (L) 07/28/2022   HCT 27.7 (L) 07/28/2022   MCV 95.5 07/28/2022   PLT 148 (L) 07/28/2022      Chemistry      Component Value Date/Time   NA 140 07/28/2022 0901   NA 139 09/22/2021 0954   K 4.7 07/28/2022 0901   CL 107 07/28/2022 0901   CO2 26 07/28/2022 0901   BUN 14 07/28/2022 0901   BUN 13 09/22/2021 0954   CREATININE 0.65 07/28/2022 0901   CREATININE  0.67 04/23/2015 1645      Component Value Date/Time   CALCIUM 9.3 07/28/2022 0901   ALKPHOS 90 07/28/2022 0901   AST 17 07/28/2022 0901   ALT 18 07/28/2022 0901   BILITOT 0.3 07/28/2022 0901

## 2022-07-28 NOTE — Assessment & Plan Note (Signed)
She has signs and symptoms of slight progression of neuropathy We will proceed with treatment today with further changes to her Taxol dose

## 2022-08-08 ENCOUNTER — Encounter: Payer: Self-pay | Admitting: Radiation Oncology

## 2022-08-10 NOTE — Progress Notes (Incomplete)
  Radiation Oncology         (336) [phone] ________________________________  Patient Name: [name] MRN: [id number] DOB: March 19, 1961 Referring Physician: Jeral Pinch Date of Service: 07/11/2022 [facility] [place]                                                        End Of Treatment Note  Diagnoses: C54.9-Malignant neoplasm of corpus uteri, unspecified  Cancer Staging: The encounter diagnosis was 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([place]).   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  Plan: The patient will follow-up with radiation oncology in one month .  ________________________________________________ -----------------------------------  Blair Promise, PhD, MD  This document serves as a record of services personally performed by Gery Pray, MD. It was created on his behalf by Roney Mans, a trained medical scribe. The creation of this record is based on the scribe's personal observations and the provider's statements to them. This document has been checked and approved by the attending provider.

## 2022-08-10 NOTE — Progress Notes (Addendum)
Radiation Oncology         [phone] ________________________________  Name: [name] MRN: [id number]  Date: 08/11/2022  DOB: Feb 26, 1961  Follow-Up Visit Note  CC: Masters, [name], DO  [name], MD    ICD-10-CM   1. 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([place])  C54.1       Diagnosis:  The encounter diagnosis was 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([place]).   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 She tolerated vaginal brachytherapy relatively well overall with minimal side effects.    Since her initial consultation date, the patient followed up with Dr. Alvy Bimler on 06/09/22. During which time, the patient was noted to tolerate systemic treatment well other than diffuse muscle aches and pain that resolved with pain medicine. The patient also had peripheral neuropathy prior to treatment which had remained stable throughout the course of treatment.      In the midst of undergoing radiation, the patient was hospitalized from 06/22/22 through 06/25/22 for suspected sepsis and hypertension. She presented with several days of generalized weakness, fatigue and a recent syncopal episode. In the ED, she remained hypotensive despite 3 L LR bolus and was promptly admitted to the ICU on 07/27 for possible septic shock and required a brief period of pressor support. Concern for sepsis was warranted due to a low-grade fever of 100.7,  but lactate was reassuring and cultures ended up showing no growth. Labs also showed procalcitonin elevated at 1. She was started on empiric vancomycin and cefepime. Her presentation was suspected to be secondary to hypovolemia in the setting of decreased oral intake, and her antibiotics were discontinued since blood cultures were negative after 2 days. On her discharge date, the patient was no longer hypotensive and tolerating oral intake well. (CXR performed in the ED showed no abnormalities).   On evaluation today the patient reports feeling well.  She denies any abdominal bloating or pelvic pain.  She denies any vaginal bleeding or discharge.  She denies any urinary symptoms or bowel complaints.  She has 1 more cycle of chemotherapy to complete her adjuvant course of treatment.  Allergies:  is allergic to nsaids and penicillins.  Meds: Current Outpatient Medications  Medication Sig Dispense Refill   acetaminophen (TYLENOL) 650 MG CR tablet Take 1,300-1,950 mg by mouth every 8 (eight) hours as needed for pain.     atorvastatin (LIPITOR) 80 MG tablet Take 1 tablet (80 mg total) by mouth daily. 90 tablet 3   Blood Pressure Monitoring (BLOOD PRESSURE KIT) DEVI Use to check blood pressure daily 1 each 0   dexamethasone (DECADRON) 4 MG tablet Take 2 tabs at the night before chemotherapy, every 3 weeks, by mouth x 6 cycles 12 tablet 6   gabapentin (NEURONTIN) 300 MG capsule Take 3 capsules (900 mg total) by mouth 3 (three) times daily. 270 capsule 6   insulin glargine (LANTUS SOLOSTAR) 100 UNIT/ML Solostar Pen Inject 30 Units into the skin at bedtime. 9 mL 2  insulin lispro (HUMALOG) 100 UNIT/ML injection Inject 0.07 mLs (7 Units total) into the skin 3 (three) times daily with meals. 10 mL 3   lidocaine-prilocaine (EMLA) cream Apply to affected area once 30 g 3   lisinopril (ZESTRIL) 20 MG tablet Take 1 tablet (20 mg total) by mouth daily. 30 tablet 11   meclizine (ANTIVERT) 25 MG tablet TAKE 1  TABLET BY MOUTH THREE TIMES DAILY AS NEEDED FOR DIZZINESS 60 tablet 0   metFORMIN (GLUCOPHAGE-XR) 500 MG 24 hr tablet Take 4 tablets (2,000 mg total) by mouth at bedtime. 360 tablet 3   Multiple Vitamin (MULTIVITAMIN WITH MINERALS) TABS tablet Take 1 tablet by mouth every evening.      Nutritional Supplements ([place]) LIQD Take 1 bottle twice daily between meals 237 mL 20   omeprazole (PRILOSEC) 20 MG capsule Take 1 capsule (20 mg total) by mouth daily. (Patient taking differently: Take 20 mg by mouth daily as needed (acid reflux).) 30 capsule 1   ondansetron (ZOFRAN) 8 MG tablet Take 1 tablet (8 mg total) by mouth 2 (two) times daily as needed. Start on the third day after chemotherapy. 30 tablet 1   prochlorperazine (COMPAZINE) 10 MG tablet Take 1 tablet (10 mg total) by mouth every 6 (six) hours as needed (Nausea or vomiting). 30 tablet 1   Semaglutide, 2 MG/DOSE, 8 MG/3ML SOPN Inject 2 mg into the skin once a week. 10 mL 3   traMADol (ULTRAM) 50 MG tablet Take 2 tablets (100 mg total) by mouth every 6 (six) hours as needed for severe pain. 60 tablet 0   traZODone (DESYREL) 50 MG tablet TAKE 1 TABLET BY MOUTH AT BEDTIME AS NEEDED FOR SLEEP 30 tablet 2   No current facility-administered medications for this encounter.    Physical Findings: The patient is in no acute distress. Patient is alert and oriented.  height is $RemoveB'6\' 1"'iIhnOkrG$  (1.854 m) and weight is 271 lb 3.2 oz (123 kg). Her oral temperature is 98.6 F (37 C). Her blood pressure is 139/78 and her pulse is 111 (abnormal). Her respiration is 18 and oxygen saturation is 100%. .  No significant changes. Lungs are clear to auscultation bilaterally. Heart has regular rate and rhythm. No palpable cervical, supraclavicular, or axillary adenopathy. Abdomen soft, non-tender, normal bowel sounds. Pelvic exam deferred in light of recent completion of treatment.  Lab Findings: Lab Results  Component Value Date   WBC 3.8 (L) 07/28/2022    HGB 9.1 (L) 07/28/2022   HCT 27.7 (L) 07/28/2022   MCV 95.5 07/28/2022   PLT 148 (L) 07/28/2022    Radiographic Findings: No results found.  Impression:   The encounter diagnosis was 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([place]).   Carcinosarcoma of the endometrium, Stage II-C by 2023 FIGO staging  She has recovered well from her vaginal brachytherapy.  Overall she tolerated this well without any significant side effects.  Today the patient was given a vaginal dilator and instructions on its use  Plan: She will follow-up with Dr. Berline Lopes in October.  Routine follow-up in radiation oncology and January 2024.   15 minutes of total time was spent for this patient encounter, including preparation, face-to-face counseling with the patient and coordination of care, physical exam, and documentation of the encounter. ____________________________________  Blair Promise, PhD, MD  This document serves as a record of services personally performed by Gery Pray, MD. It was created on his behalf by Roney Mans, a trained medical scribe. The creation of  this record is based on the scribe's personal observations and the provider's statements to them. This document has been checked and approved by the attending provider.

## 2022-08-11 ENCOUNTER — Ambulatory Visit
Admission: RE | Admit: 2022-08-11 | Discharge: 2022-08-11 | Disposition: A | Discharge status: Home / Self Care | Payer: Medicaid Other | Source: Ambulatory Visit | Attending: Radiation Oncology | Admitting: Radiation Oncology

## 2022-08-11 VITALS — BP 139/78 | HR 111 | Temp 98.6°F | Resp 18 | Ht 73.0 in | Wt 271.2 lb

## 2022-08-11 DIAGNOSIS — C541 Malignant neoplasm of endometrium: Secondary | ICD-10-CM | POA: Insufficient documentation

## 2022-08-11 HISTORY — DX: Personal history of irradiation: Z92.3

## 2022-08-11 NOTE — Addendum Note (Signed)
Encounter addended by: Gery Pray, MD on: 08/11/2022 10:39 AM  Actions taken: Clinical Note Signed

## 2022-08-11 NOTE — Progress Notes (Signed)
[name] is here today for follow up post radiation to the pelvic.  They completed their radiation on: 07/11/2022   Does the patient complain of any of the following:  Pain:no Abdominal bloating: no Diarrhea/Constipation: no Nausea/Vomiting: no Vaginal Discharge: no Blood in Urine or Stool: no Urinary Issues (dysuria/incomplete emptying/ incontinence/ increased frequency/urgency): no Post radiation skin changes: no  Patient was given a size S and S+ vaginal dilator and surgilube.  Patient was educated in teach back mode to apply surgilube to the dilator and to insert for 10 minutes 3 times a week (Monday, Wednesday, Friday) and to wash with soap and water after use.  Patient verbalized understanding and agreement.    Additional comments if applicable: Her last chemotherapy infusion is 08/18/2022.  BP 139/78 (BP Location: Right Arm, Patient Position: Sitting, Cuff Size: Large)   Pulse (!) 111   Temp 98.6 F (37 C) (Oral)   Resp 18   Ht '6\' 1"'$  (1.854 m)   Wt 271 lb 3.2 oz (123 kg)   LMP 06/23/2014   SpO2 100%   BMI 35.78 kg/m

## 2022-08-11 NOTE — Patient Instructions (Signed)

## 2022-08-12 ENCOUNTER — Other Ambulatory Visit: Payer: Self-pay

## 2022-08-17 MED FILL — Dexamethasone Sodium Phosphate Inj 100 MG/10ML: INTRAMUSCULAR | Qty: 1 | Status: AC

## 2022-08-17 MED FILL — Fosaprepitant Dimeglumine For IV Infusion 150 MG (Base Eq): INTRAVENOUS | Qty: 5 | Status: AC

## 2022-08-18 ENCOUNTER — Inpatient Hospital Stay: Payer: Medicaid Other | Attending: Gynecologic Oncology

## 2022-08-18 ENCOUNTER — Inpatient Hospital Stay: Payer: Medicaid Other

## 2022-08-18 ENCOUNTER — Other Ambulatory Visit: Payer: Self-pay

## 2022-08-18 ENCOUNTER — Inpatient Hospital Stay (HOSPITAL_BASED_OUTPATIENT_CLINIC_OR_DEPARTMENT_OTHER): Payer: Medicaid Other | Admitting: Hematology and Oncology

## 2022-08-18 ENCOUNTER — Encounter: Payer: Self-pay | Admitting: Hematology and Oncology

## 2022-08-18 VITALS — HR 98

## 2022-08-18 VITALS — BP 141/92 | HR 112 | Temp 97.7°F | Resp 18 | Ht 73.0 in | Wt 264.2 lb

## 2022-08-18 DIAGNOSIS — Z5111 Encounter for antineoplastic chemotherapy: Secondary | ICD-10-CM | POA: Diagnosis present

## 2022-08-18 DIAGNOSIS — Z79899 Other long term (current) drug therapy: Secondary | ICD-10-CM | POA: Diagnosis not present

## 2022-08-18 DIAGNOSIS — C541 Malignant neoplasm of endometrium: Secondary | ICD-10-CM | POA: Diagnosis present

## 2022-08-18 DIAGNOSIS — D61818 Other pancytopenia: Secondary | ICD-10-CM

## 2022-08-18 DIAGNOSIS — E1340 Other specified diabetes mellitus with diabetic neuropathy, unspecified: Secondary | ICD-10-CM

## 2022-08-18 LAB — CMP (CANCER CENTER ONLY)
ALT: 11 U/L (ref 0–44)
AST: 14 U/L — ABNORMAL LOW (ref 15–41)
Albumin: 4.3 g/dL (ref 3.5–5.0)
Alkaline Phosphatase: 94 U/L (ref 38–126)
Anion gap: 8 (ref 5–15)
BUN: 14 mg/dL (ref 6–20)
CO2: 25 mmol/L (ref 22–32)
Calcium: 9.4 mg/dL (ref 8.9–10.3)
Chloride: 106 mmol/L (ref 98–111)
Creatinine: 0.79 mg/dL (ref 0.44–1.00)
GFR, Estimated: >60 mL/min (ref >60)
Glucose, Bld: 116 mg/dL — ABNORMAL HIGH (ref 70–99)
Potassium: 4.6 mmol/L (ref 3.5–5.1)
Sodium: 139 mmol/L (ref 135–145)
Total Bilirubin: 0.3 mg/dL (ref 0.3–1.2)
Total Protein: 7.3 g/dL (ref 6.5–8.1)

## 2022-08-18 LAB — CBC WITH DIFFERENTIAL (CANCER CENTER ONLY)
Abs Immature Granulocytes: 0.01 10*3/uL (ref 0.00–0.07)
Basophils Absolute: 0 10*3/uL (ref 0.0–0.1)
Basophils Relative: 0 %
Eosinophils Absolute: 0 10*3/uL (ref 0.0–0.5)
Eosinophils Relative: 0 %
HCT: 27 % — ABNORMAL LOW (ref 36.0–46.0)
Hemoglobin: 8.8 g/dL — ABNORMAL LOW (ref 12.0–15.0)
Immature Granulocytes: 0 %
Lymphocytes Relative: 24 %
Lymphs Abs: 1.1 10*3/uL (ref 0.7–4.0)
MCH: 32.5 pg (ref 26.0–34.0)
MCHC: 32.6 g/dL (ref 30.0–36.0)
MCV: 99.6 fL (ref 80.0–100.0)
Monocytes Absolute: 0.2 10*3/uL (ref 0.1–1.0)
Monocytes Relative: 5 %
Neutro Abs: 3.1 10*3/uL (ref 1.7–7.7)
Neutrophils Relative %: 71 %
Platelet Count: 141 10*3/uL — ABNORMAL LOW (ref 150–400)
RBC: 2.71 MIL/uL — ABNORMAL LOW (ref 3.87–5.11)
RDW: 20 % — ABNORMAL HIGH (ref 11.5–15.5)
WBC Count: 4.4 10*3/uL (ref 4.0–10.5)
nRBC: 0 % (ref 0.0–0.2)

## 2022-08-18 MED ORDER — SODIUM CHLORIDE 0.9 % IV SOLN: Freq: Once | INTRAVENOUS | Status: AC

## 2022-08-18 MED ORDER — SODIUM CHLORIDE 0.9 % IV SOLN
750.0000 mg | Freq: Once | INTRAVENOUS | Status: AC
  Administered 2022-08-18: 750 mg via INTRAVENOUS
  Filled 2022-08-18: qty 75

## 2022-08-18 MED ORDER — SODIUM CHLORIDE 0.9 % IV SOLN
150.0000 mg | Freq: Once | INTRAVENOUS | Status: AC
  Administered 2022-08-18: 150 mg via INTRAVENOUS
  Filled 2022-08-18: qty 150

## 2022-08-18 MED ORDER — SODIUM CHLORIDE 0.9% FLUSH
10.0000 mL | INTRAVENOUS | Status: DC | PRN
  Administered 2022-08-18: 10 mL

## 2022-08-18 MED ORDER — HEPARIN SOD (PORK) LOCK FLUSH 100 UNIT/ML IV SOLN
500.0000 [IU] | Freq: Once | INTRAVENOUS | Status: AC | PRN
  Administered 2022-08-18: 500 [IU]

## 2022-08-18 MED ORDER — SODIUM CHLORIDE 0.9 % IV SOLN
10.0000 mg | Freq: Once | INTRAVENOUS | Status: AC
  Administered 2022-08-18: 10 mg via INTRAVENOUS
  Filled 2022-08-18: qty 10

## 2022-08-18 MED ORDER — SODIUM CHLORIDE 0.9% FLUSH
10.0000 mL | Freq: Once | INTRAVENOUS | Status: AC
  Administered 2022-08-18: 10 mL

## 2022-08-18 MED ORDER — PALONOSETRON HCL INJECTION 0.25 MG/5ML
0.2500 mg | Freq: Once | INTRAVENOUS | Status: AC
  Administered 2022-08-18: 0.25 mg via INTRAVENOUS
  Filled 2022-08-18: qty 5

## 2022-08-18 MED ORDER — TRAMADOL HCL 50 MG PO TABS: 100.0000 mg | ORAL_TABLET | Freq: Four times a day (QID) | ORAL | 0 refills | Status: DC | PRN

## 2022-08-18 MED ORDER — PROCHLORPERAZINE MALEATE 10 MG PO TABS: 10.0000 mg | ORAL_TABLET | Freq: Four times a day (QID) | ORAL | 1 refills | Status: DC | PRN

## 2022-08-18 NOTE — Assessment & Plan Note (Signed)
She is getting worsening neuropathy, despite recent dose reduction of Taxol We discussed the risk and benefits of omission of Taxol versus further dose reduction and ultimately, she is in agreement to discontinue Taxol I plan to repeat imaging study next month for objective assessment of response to therapy We will get her port removed in the future

## 2022-08-18 NOTE — Assessment & Plan Note (Signed)
She has slight worsening pancytopenia due to treatment Plan dose adjustment as above

## 2022-08-18 NOTE — Assessment & Plan Note (Signed)
She has multifactorial causes of neuropathy including chemotherapy and diabetes Due to worsening neuropathy, I recommend discontinuation of Taxol and she is in agreement She will continue aggressive risk factor modification for her diabetes

## 2022-08-18 NOTE — Progress Notes (Signed)
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[facility]) She is getting worsening neuropathy, despite recent dose reduction of Taxol We discussed the risk and benefits of omission of Taxol versus further dose reduction and ultimately, she is in agreement to discontinue Taxol I plan to repeat imaging study next month for objective assessment of response to therapy We will get her port removed in the future  Neuropathy due to secondary diabetes [facility]) She has multifactorial causes of neuropathy including chemotherapy and diabetes Due to worsening neuropathy, I recommend discontinuation of Taxol and she is in agreement She will continue aggressive risk factor modification for her diabetes  Pancytopenia, acquired ([place]) She has slight worsening pancytopenia due to treatment Plan dose adjustment as above  Orders Placed This Encounter  Procedures   CT ABDOMEN PELVIS W CONTRAST    Standing Status:   Future    Standing Expiration Date:   08/19/2023    Order Specific Question:   If indicated for the ordered procedure, I authorize the administration of contrast media per Radiology protocol    Answer:   Yes    Order Specific Question:   Preferred imaging location?    Answer:   [facility]    Order Specific Question:   Radiology Contrast Protocol - do NOT remove file path    Answer:   [email or web address]    Order Specific Question:   Is patient pregnant?    Answer:   No    All questions were answered. The patient knows to call the clinic with any problems, questions or concerns. The total time spent in the appointment was 40 minutes encounter with patients including review of chart and various tests results, discussions about plan of care and coordination of care plan   Heath Lark, MD 08/18/2022 10:42 AM  INTERVAL  HISTORY: Please see below for problem oriented charting. she returns for treatment follow-up seen prior to cycle 6 of treatment She has noted worsening neuropathy since last time I saw her Her energy level is fair She has mild intermittent bone aches after treatment, stable The patient denies any recent signs or symptoms of bleeding such as spontaneous epistaxis, hematuria or hematochezia.   REVIEW OF SYSTEMS:   Constitutional: Denies fevers, chills or abnormal weight loss Eyes: Denies blurriness of vision Ears, nose, mouth, throat, and face: Denies mucositis or sore throat Respiratory: Denies cough, dyspnea or wheezes Cardiovascular: Denies palpitation, chest discomfort or lower extremity swelling Gastrointestinal:  Denies nausea, heartburn or change in bowel habits Skin: Denies abnormal skin rashes Lymphatics: Denies new lymphadenopathy or easy bruising Behavioral/Psych: Mood is stable, no new changes  All other systems were reviewed with the patient and are negative.  I have reviewed the past medical history, past surgical history, social history and family history with the patient and they are unchanged from previous note.  ALLERGIES:  is allergic to nsaids and penicillins.  MEDICATIONS:  Current Outpatient Medications  Medication Sig Dispense Refill   acetaminophen (TYLENOL) 650 MG CR tablet Take 1,300-1,950 mg by mouth every 8 (eight) hours as needed for pain.     atorvastatin (LIPITOR) 80 MG tablet Take 1 tablet (80 mg total) by mouth daily. 90 tablet 3   Blood Pressure Monitoring (BLOOD PRESSURE KIT) DEVI Use to check blood pressure daily 1 each 0   dexamethasone (DECADRON) 4 MG tablet Take 2 tabs at the night before  chemotherapy, every 3 weeks, by mouth x 6 cycles 12 tablet 6   gabapentin (NEURONTIN) 300 MG capsule Take 3 capsules (900 mg total) by mouth 3 (three) times daily. 270 capsule 6   insulin glargine (LANTUS SOLOSTAR) 100 UNIT/ML Solostar Pen Inject 30 Units into  the skin at bedtime. 9 mL 2   insulin lispro (HUMALOG) 100 UNIT/ML injection Inject 0.07 mLs (7 Units total) into the skin 3 (three) times daily with meals. 10 mL 3   lidocaine-prilocaine (EMLA) cream Apply to affected area once 30 g 3   lisinopril (ZESTRIL) 20 MG tablet Take 1 tablet (20 mg total) by mouth daily. 30 tablet 11   meclizine (ANTIVERT) 25 MG tablet TAKE 1 TABLET BY MOUTH THREE TIMES DAILY AS NEEDED FOR DIZZINESS 60 tablet 0   metFORMIN (GLUCOPHAGE-XR) 500 MG 24 hr tablet Take 4 tablets (2,000 mg total) by mouth at bedtime. 360 tablet 3   Multiple Vitamin (MULTIVITAMIN WITH MINERALS) TABS tablet Take 1 tablet by mouth every evening.      Nutritional Supplements ([place]) LIQD Take 1 bottle twice daily between meals 237 mL 20   omeprazole (PRILOSEC) 20 MG capsule Take 1 capsule (20 mg total) by mouth daily. (Patient taking differently: Take 20 mg by mouth daily as needed (acid reflux).) 30 capsule 1   ondansetron (ZOFRAN) 8 MG tablet Take 1 tablet (8 mg total) by mouth 2 (two) times daily as needed. Start on the third day after chemotherapy. 30 tablet 1   prochlorperazine (COMPAZINE) 10 MG tablet Take 1 tablet (10 mg total) by mouth every 6 (six) hours as needed (Nausea or vomiting). 30 tablet 1   Semaglutide, 2 MG/DOSE, 8 MG/3ML SOPN Inject 2 mg into the skin once a week. 10 mL 3   traMADol (ULTRAM) 50 MG tablet Take 2 tablets (100 mg total) by mouth every 6 (six) hours as needed for severe pain. 30 tablet 0   traZODone (DESYREL) 50 MG tablet TAKE 1 TABLET BY MOUTH AT BEDTIME AS NEEDED FOR SLEEP 30 tablet 2   No current facility-administered medications for this visit.   Facility-Administered Medications Ordered in Other Visits  Medication Dose Route Frequency Provider Last Rate Last Admin   CARBOplatin (PARAPLATIN) 750 mg in sodium chloride 0.9 % 250 mL chemo infusion  750 mg Intravenous Once Alvy Bimler, Doneta Bayman, MD       heparin lock flush 100 unit/mL  500 Units  Intracatheter Once PRN Alvy Bimler, Quintavius Niebuhr, MD       sodium chloride flush (NS) 0.9 % injection 10 mL  10 mL Intracatheter PRN Heath Lark, MD        SUMMARY OF ONCOLOGIC HISTORY: Oncology History Overview Note  P53 wild type   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([place])  02/01/2022 Initial Diagnosis   She presented with postmenopausal bleeding   02/07/2022 Imaging   US pelvis Thickened heterogeneous endometrium with masslike region with internal vascularity. Findings may be associated with endometrial carcinoma or hyperplasia. Biopsy is recommended for further evaluation.   02/22/2022 Initial Biopsy   EMB: MMMT/carcinosarcoma (predominance of sarcomatous component)   03/05/2022 Imaging   Ct chest, abdomen and pelvis 1. Heterogeneous enlargement of the endometrial cavity is compatible with the reported history of uterine carcinosarcoma. No definite involvement of the posterior bladder wall or sigmoid colon/rectum. 2. 9 mm short axis right external iliac node is upper normal for size. Attention on follow-up recommended. Otherwise no lymphadenopathy in the abdomen or pelvis. 3. 3.6 x 2.7 cm lesion in  the lateral segment left liver has subtle peripheral nodular enhancement. This is almost certainly a benign cavernous hemangioma, but MRI abdomen with and without contrast recommended to confirm. 4. Aortic Atherosclerosis (ICD10-I70.0).   03/08/2022 Initial Diagnosis   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([place])   03/30/2022 Surgery   TRH/BSO, SLN biopsy on right, left pelvic LND, LOS, mini-lap  Findings: On EUA, 12cm minimally mobile uterus. On intra-abdominal exam, normal upper abdominal survey. Omentum adherent to the anterior abdominal wall along the prior midline incision. Normal omentum otherwise, normal small and large bowel. 12 cm uterus densely adherent to the anterior abdominal wall, obliterating some of the anterior anatomy including the anterior cul de sac. Normal appearing bilateral adnexa. Mapping successful  to right obturator SLN, mildly enlarged. Dye seen within the parametrium on the left, no SLNs identified. No obvious adenopathy on the left. Decision made given length of surgery, comorbitidies to defer left para-aortic lymphadenectomy. Mini-lap required for specimen delivery. Dome intact on cystoscopy and good efflux noted from bilateral ureteral orifices.   03/30/2022 Pathology Results   MMMT/carcinosarcoma, 6.3 cm MI 1cm or 3 (<50%) Cervical stroma, bilateral tubes/ovaries benign R SLN and L pelvic LNDs negative  ONCOLOGY TABLE:   UTERUS, CARCINOMA OR CARCINOSARCOMA: Resection   Procedure: Total hysterectomy and bilateral salpingo-oophorectomy  Histologic Type:  Malignant mixed Mullerian tumor (MMMT/ carcinosarcoma)  Histologic Grade: High-grade  Myometrial Invasion:       Depth of Myometrial Invasion (mm): 10 mm       Myometrial Thickness (mm): 30 mm       Percentage of Myometrial Invasion: 33%  Uterine Serosa Involvement: Not identified  Cervical stromal Involvement: Not identified  Extent of involvement of other tissue/organs: Not identified  Peritoneal/Ascitic Fluid: Not applicable  Lymphovascular Invasion: Not identified  Regional Lymph Nodes:       Pelvic Lymph Nodes Examined:                                   1 Sentinel                                   6 Non-sentinel                                   7 Total       Pelvic Lymph Nodes with Metastasis: 0                           Macrometastasis: (>2.0 mm): 0                           Micrometastasis: (>0.2 mm and < 2.0 mm): 0                           Isolated Tumor Cells (<0.2 mm): 0                           Laterality of Lymph Node with Tumor: Not  applicable                           Extracapsular Extension: Not applicable  Para-aortic Lymph Nodes Examined:                                    0 Sentinel                                    0 non-sentinel                                    0 total  Distant  Metastasis:       Distant Site(s) Involved: Not applicable  Pathologic Stage Classification (pTNM, AJCC 8th Edition): pT1a, pN0  Ancillary Studies: MMR / MSI testing will be ordered  Representative Tumor Block: B1  Comment(s): Pancytokeratin was performed on the lymph nodes and is  negative.    04/08/2022 Cancer Staging   Staging form: Corpus Uteri - Carcinoma and Carcinosarcoma, AJCC 8th Edition - Pathologic stage from 04/08/2022: FIGO Stage IA (pT1a, pN0, cM0) - Signed by Heath Lark, MD on 04/08/2022 Stage prefix: Initial diagnosis   04/21/2022 Procedure   Placement of single lumen port a cath via right internal jugular vein. The catheter tip lies at the cavo-atrial junction. A power injectable port a cath was placed and is ready for immediate use.       04/29/2022 -  Chemotherapy   Patient is on Treatment Plan : UTERINE Carboplatin AUC 6 / Paclitaxel q21d       PHYSICAL EXAMINATION: ECOG PERFORMANCE STATUS: 1 - Symptomatic but completely ambulatory  Vitals:   08/18/22 0900  BP: (!) 141/92  Pulse: (!) 112  Resp: 18  Temp: 97.7 F (36.5 C)  SpO2: 100%   Filed Weights   08/18/22 0900  Weight: 264 lb 3.2 oz (119.8 kg)    GENERAL:alert, no distress and comfortable NEURO: alert & oriented x 3 with fluent speech, no focal motor/sensory deficits  LABORATORY DATA:  I have reviewed the data as listed    Component Value Date/Time   NA 139 08/18/2022 0839   NA 139 09/22/2021 0954   K 4.6 08/18/2022 0839   CL 106 08/18/2022 0839   CO2 25 08/18/2022 0839   GLUCOSE 116 (H) 08/18/2022 0839   BUN 14 08/18/2022 0839   BUN 13 09/22/2021 0954   CREATININE 0.79 08/18/2022 0839   CREATININE 0.67 04/23/2015 1645   CALCIUM 9.4 08/18/2022 0839   PROT 7.3 08/18/2022 0839   PROT 7.3 09/22/2021 0954   ALBUMIN 4.3 08/18/2022 0839   ALBUMIN 4.4 09/22/2021 0954   AST 14 (L) 08/18/2022 0839   ALT 11 08/18/2022 0839   ALKPHOS 94 08/18/2022 0839   BILITOT 0.3 08/18/2022 0839   GFRNONAA  >60 08/18/2022 0839   GFRNONAA >89 01/06/2014 1643   GFRAA >60 02/25/2020 0334   GFRAA >89 01/06/2014 1643    No results found for: "SPEP", "UPEP"  Lab Results  Component Value Date   WBC 4.4 08/18/2022   NEUTROABS 3.1 08/18/2022   HGB 8.8 (L) 08/18/2022   HCT 27.0 (L) 08/18/2022   MCV 99.6 08/18/2022   PLT 141 (L) 08/18/2022      Chemistry      Component Value Date/Time   NA 139 08/18/2022 0839   NA 139 09/22/2021 0954   K 4.6 08/18/2022 0839   CL 106 08/18/2022 0839  CO2 25 08/18/2022 0839   BUN 14 08/18/2022 0839   BUN 13 09/22/2021 0954   CREATININE 0.79 08/18/2022 0839   CREATININE 0.67 04/23/2015 1645      Component Value Date/Time   CALCIUM 9.4 08/18/2022 0839   ALKPHOS 94 08/18/2022 0839   AST 14 (L) 08/18/2022 0839   ALT 11 08/18/2022 0839   BILITOT 0.3 08/18/2022 0839

## 2022-08-18 NOTE — Patient Instructions (Signed)
[facility] MEDICAL ONCOLOGY  Discharge Instructions: Thank you for choosing [facility] to provide your oncology and hematology care.   If you have a lab appointment with the Cancer Center, please go directly to the Cancer Center and check in at the registration area.   Wear comfortable clothing and clothing appropriate for easy access to any Portacath or PICC line.   We strive to give you quality time with your provider. You may need to reschedule your appointment if you arrive late (15 or more minutes).  Arriving late affects you and other patients whose appointments are after yours.  Also, if you miss three or more appointments without notifying the office, you may be dismissed from the clinic at the provider's discretion.      For prescription refill requests, have your pharmacy contact our office and allow 72 hours for refills to be completed.    Today you received the following chemotherapy and/or immunotherapy agents carboplatin      To help prevent nausea and vomiting after your treatment, we encourage you to take your nausea medication as directed.  BELOW ARE SYMPTOMS THAT SHOULD BE REPORTED IMMEDIATELY: *FEVER GREATER THAN 100.4 F (38 C) OR HIGHER *CHILLS OR SWEATING *NAUSEA AND VOMITING THAT IS NOT CONTROLLED WITH YOUR NAUSEA MEDICATION *UNUSUAL SHORTNESS OF BREATH *UNUSUAL BRUISING OR BLEEDING *URINARY PROBLEMS (pain or burning when urinating, or frequent urination) *BOWEL PROBLEMS (unusual diarrhea, constipation, pain near the anus) TENDERNESS IN MOUTH AND THROAT WITH OR WITHOUT PRESENCE OF ULCERS (sore throat, sores in mouth, or a toothache) UNUSUAL RASH, SWELLING OR PAIN  UNUSUAL VAGINAL DISCHARGE OR ITCHING   Items with * indicate a potential emergency and should be followed up as soon as possible or go to the Emergency Department if any problems should occur.  Please show the CHEMOTHERAPY ALERT CARD or IMMUNOTHERAPY ALERT CARD at check-in to  the Emergency Department and triage nurse.  Should you have questions after your visit or need to cancel or reschedule your appointment, please contact [facility] MEDICAL [facility]COLOG[facility]  Dept: [phone]  and follow the prompts.  Office hours are 8:00 a.m. to 4:30 p.m. Monday - Friday. Please note that voicemails left after 4:00 p.m. may not be returned until the following business day.  We are closed weekends and major holidays. You have access to a nurse at all times for urgent questions. Please call the main number to the clinic Dept: [phone] and follow the prompts.   For any non-urgent questions, you may also contact your provider using MyChart. We now offer e-Visits for anyone 18 and older to request care online for non-urgent symptoms. For details visit [email or web address].   Also download the MyChart app! Go to the app store, search "MyChart", open the app, select [facility], and log in with your MyChart username and password.  Masks are optional in the cancer centers. If you would like for your care team to wear a mask while they are taking care of you, please let them know. You may have one support person who is at least 60 years old accompany you for your appointments. 

## 2022-08-19 ENCOUNTER — Other Ambulatory Visit: Payer: Self-pay | Admitting: Hematology and Oncology

## 2022-08-19 DIAGNOSIS — C541 Malignant neoplasm of endometrium: Secondary | ICD-10-CM

## 2022-08-24 ENCOUNTER — Other Ambulatory Visit: Payer: Self-pay | Admitting: Student

## 2022-08-24 NOTE — Telephone Encounter (Signed)
Patient called in stating confusion as to why she has vials of novolog but pens of Lantus. She is requesting insulin syringes for her novolog and would like to discuss switching to pens at [place] on 10/11. States there seems to be confusion with her meds recently i.e. A1c 5.7 so units decreased yet she is on steroids so sugars go up. She is advised to compare what she knows she is taking with most recent med list from [facility] and bring with her to next OV so med rec can be done. She agrees to this.

## 2022-08-26 ENCOUNTER — Other Ambulatory Visit (INDEPENDENT_AMBULATORY_CARE_PROVIDER_SITE_OTHER): Payer: Medicaid Other | Admitting: Internal Medicine

## 2022-08-26 DIAGNOSIS — E1140 Type 2 diabetes mellitus with diabetic neuropathy, unspecified: Secondary | ICD-10-CM

## 2022-08-26 DIAGNOSIS — C541 Malignant neoplasm of endometrium: Secondary | ICD-10-CM

## 2022-08-26 MED ORDER — "INSULIN SYRINGE-NEEDLE U-100 31G X 15/64"" 0.5 ML MISC": 1.0000 | Freq: Three times a day (TID) | 3 refills | Status: AC

## 2022-08-26 MED ORDER — DEXCOM G7 RECEIVER DEVI: 1.0000 | Freq: Once | 0 refills | Status: AC

## 2022-08-26 NOTE — Progress Notes (Signed)
I called and spoke with Ms. Rolena Infante. She states that she is currently using Humalog vial and Lantus Pens. She would like to switch to humalog pen at upcoming appointment in Oct. Her blood sugar was not controlled on regimen and she titrated to what she was on previously. She has been taking Lantus 80 units nightly and Humalog 18-22 units with meals. Her fasting blood sugars have been between 90-120. She has not had low blood sugars. She also would like to have dexcom reader ordered as she has misplaced her for some time. I let her know that I will place order but am unsure if insurance will cover another reader. P: -Insulin syringe U-100 31 g 15/64 50 syringes sent to [facility] on [address] -Dexcom G 6 ordered  Current insulin regimen per patient: Lantus 80 units Humalog 8-22 units

## 2022-08-27 MED ORDER — MECLIZINE HCL 25 MG PO TABS: 25.0000 mg | ORAL_TABLET | Freq: Two times a day (BID) | ORAL | 3 refills | Status: DC | PRN

## 2022-08-27 NOTE — Addendum Note (Signed)
Addended by: Edwyna Perfect on: 08/27/2022 07:20 AM   Modules accepted: Orders

## 2022-08-29 ENCOUNTER — Telehealth: Payer: Self-pay

## 2022-08-29 NOTE — Telephone Encounter (Signed)
Prior Authorization for patient (Dexcom G7 Reciever Device) came through on cover my meds awaiting approval or denial

## 2022-08-31 ENCOUNTER — Other Ambulatory Visit: Payer: Self-pay | Admitting: Internal Medicine

## 2022-08-31 DIAGNOSIS — E1140 Type 2 diabetes mellitus with diabetic neuropathy, unspecified: Secondary | ICD-10-CM

## 2022-08-31 MED ORDER — ACCU-CHEK GUIDE W/DEVICE KIT: PACK | 1 refills | Status: DC

## 2022-08-31 MED ORDER — ACCU-CHEK GUIDE VI STRP: ORAL_STRIP | 12 refills | Status: AC

## 2022-08-31 MED ORDER — ACCU-CHEK SOFTCLIX LANCETS MISC: 10 refills | Status: DC

## 2022-08-31 NOTE — Telephone Encounter (Signed)
Pt requesting a call back.  Pt states on Sunday 08/28/2022 EMS was called out to her home for Low Blood Sugars of 49.  Pt is requesting a call back about needing a new meter. The pt is concerned about feeling really tired and needing to know what her BS are right now.

## 2022-08-31 NOTE — Telephone Encounter (Signed)
Return pt's call who stated her BS had dropped Sunday and the fire dept had to break down her door b/c she could not move (she thought she had a stroke). EMS checked her BS and it was 49. Stated she thinks being on Ozempic ,she does not have an appetite. But she feels better today; she's unable to check her BS's. Stated she recently moved and lost the reader part of the Dexcom. So she needs a new one. Thanks

## 2022-08-31 NOTE — Telephone Encounter (Signed)
Requested renewal approved.

## 2022-08-31 NOTE — Telephone Encounter (Signed)
CALL TO FOLLOW UP ON WHAT PATIENT NEEDS TO XCHEKC HER BLOOD SUGAR; SHE STATE SHE HAS NOT HAD READINGS FOR A FEW WEEKS; she had been using the Dexcom G6 CGM system  HAS A FEW DEXCOM g6 SENSORS AND MISPLACED HR G6 READER; USUALLY GETS THEM AT [place];   SHE STATES LOW BLOOD SUGAR WAS BECAUSE HER  APPETITE MUCH LESS; TOOK BOTH LANTUS AND HUMALOG BEFORE BED THINKING SHE HAD EATEN ENOUGH;  AGREES HER INSULIN MAY NEED TO BE ADJUSTED, . ADVISED TO NOT TAKE HUMALOG BEFORE BED WHEN SHE CANNOT CHECK BLOOD SUGARS. REQUeST PRESCRIPTION FOR METER AND SUPPLIES (note Dexcom G7 reader pending if approved, she will need the Dexcom G7 sensors)

## 2022-09-02 ENCOUNTER — Other Ambulatory Visit: Payer: Self-pay | Admitting: Student

## 2022-09-02 DIAGNOSIS — E1142 Type 2 diabetes mellitus with diabetic polyneuropathy: Secondary | ICD-10-CM

## 2022-09-03 ENCOUNTER — Emergency Department (HOSPITAL_COMMUNITY)
Admission: EM | Admit: 2022-09-03 | Discharge: 2022-09-03 | Disposition: A | Discharge status: Home / Self Care | Payer: Medicaid Other | Attending: Emergency Medicine | Admitting: Emergency Medicine

## 2022-09-03 ENCOUNTER — Other Ambulatory Visit: Payer: Self-pay

## 2022-09-03 ENCOUNTER — Encounter (HOSPITAL_COMMUNITY): Payer: Self-pay

## 2022-09-03 DIAGNOSIS — E11649 Type 2 diabetes mellitus with hypoglycemia without coma: Secondary | ICD-10-CM | POA: Diagnosis present

## 2022-09-03 DIAGNOSIS — J45909 Unspecified asthma, uncomplicated: Secondary | ICD-10-CM | POA: Insufficient documentation

## 2022-09-03 DIAGNOSIS — D649 Anemia, unspecified: Secondary | ICD-10-CM | POA: Diagnosis not present

## 2022-09-03 DIAGNOSIS — R000 Tachycardia, unspecified: Secondary | ICD-10-CM | POA: Diagnosis not present

## 2022-09-03 DIAGNOSIS — Z8542 Personal history of malignant neoplasm of other parts of uterus: Secondary | ICD-10-CM | POA: Insufficient documentation

## 2022-09-03 DIAGNOSIS — I10 Essential (primary) hypertension: Secondary | ICD-10-CM | POA: Diagnosis not present

## 2022-09-03 DIAGNOSIS — E162 Hypoglycemia, unspecified: Secondary | ICD-10-CM

## 2022-09-03 DIAGNOSIS — E1140 Type 2 diabetes mellitus with diabetic neuropathy, unspecified: Secondary | ICD-10-CM

## 2022-09-03 LAB — CBC WITH DIFFERENTIAL/PLATELET
Abs Immature Granulocytes: 0 10*3/uL (ref 0.00–0.07)
Basophils Absolute: 0 10*3/uL (ref 0.0–0.1)
Basophils Relative: 0 %
Eosinophils Absolute: 0 10*3/uL (ref 0.0–0.5)
Eosinophils Relative: 0 %
HCT: 25 % — ABNORMAL LOW (ref 36.0–46.0)
Hemoglobin: 8.1 g/dL — ABNORMAL LOW (ref 12.0–15.0)
Immature Granulocytes: 0 %
Lymphocytes Relative: 34 %
Lymphs Abs: 0.8 10*3/uL (ref 0.7–4.0)
MCH: 34.2 pg — ABNORMAL HIGH (ref 26.0–34.0)
MCHC: 32.4 g/dL (ref 30.0–36.0)
MCV: 105.5 fL — ABNORMAL HIGH (ref 80.0–100.0)
Monocytes Absolute: 0.1 10*3/uL (ref 0.1–1.0)
Monocytes Relative: 4 %
Neutro Abs: 1.5 10*3/uL — ABNORMAL LOW (ref 1.7–7.7)
Neutrophils Relative %: 62 %
Platelets: 101 10*3/uL — ABNORMAL LOW (ref 150–400)
RBC: 2.37 MIL/uL — ABNORMAL LOW (ref 3.87–5.11)
RDW: 18.4 % — ABNORMAL HIGH (ref 11.5–15.5)
WBC: 2.4 10*3/uL — ABNORMAL LOW (ref 4.0–10.5)
nRBC: 0 % (ref 0.0–0.2)

## 2022-09-03 LAB — BASIC METABOLIC PANEL
Anion gap: 7 (ref 5–15)
BUN: 24 mg/dL — ABNORMAL HIGH (ref 6–20)
CO2: 23 mmol/L (ref 22–32)
Calcium: 9.5 mg/dL (ref 8.9–10.3)
Chloride: 110 mmol/L (ref 98–111)
Creatinine, Ser: 0.8 mg/dL (ref 0.44–1.00)
GFR, Estimated: >60 mL/min (ref >60)
Glucose, Bld: 115 mg/dL — ABNORMAL HIGH (ref 70–99)
Potassium: 4.3 mmol/L (ref 3.5–5.1)
Sodium: 140 mmol/L (ref 135–145)

## 2022-09-03 LAB — CBG MONITORING, ED
Glucose-Capillary: 76 mg/dL (ref 70–99)
Glucose-Capillary: 109 mg/dL — ABNORMAL HIGH (ref 70–99)
Glucose-Capillary: 90 mg/dL (ref 70–99)

## 2022-09-03 MED ORDER — ACCU-CHEK SOFTCLIX LANCETS MISC: 10 refills | Status: DC

## 2022-09-03 MED ORDER — ACCU-CHEK GUIDE W/DEVICE KIT: PACK | 1 refills | Status: DC

## 2022-09-03 MED ORDER — ACCU-CHEK SOFTCLIX LANCETS MISC: 10 refills | Status: AC

## 2022-09-03 MED ORDER — HEPARIN SOD (PORK) LOCK FLUSH 100 UNIT/ML IV SOLN
500.0000 [IU] | Freq: Once | INTRAVENOUS | Status: AC
  Administered 2022-09-03: 500 [IU]
  Filled 2022-09-03: qty 5

## 2022-09-03 MED ORDER — SODIUM CHLORIDE 0.9 % IV BOLUS
500.0000 mL | Freq: Once | INTRAVENOUS | Status: AC
  Administered 2022-09-03: 500 mL via INTRAVENOUS

## 2022-09-03 NOTE — ED Provider Notes (Signed)
Emergency Department Provider Note   I have reviewed the triage vital signs and the nursing notes.   HISTORY  Chief Complaint Hypoglycemia   HPI [name] is a 61 y.o. female with past medical history of insulin-dependent diabetes presents with hypoglycemia.  Patient states she has been without her Dexcom glucose monitor since misplacing it.  She has not had her regular glucose monitor either but continues to administer her insulin.  She has had 1 episode of hypoglycemia earlier this week followed by a second episode today.  She recalls waking up this morning and feeling fine.  She did not check her blood sugar but administered her sliding scale insulin thinking her blood sugar would be slightly high due to eating some candy last night.  Her daughter arrived to find her confused but patient was able to communicate that she needed to get some candy from her bedside table.  She was given this prior to EMS arrival where they recorded a blood sugar of 59.  Patient administered additional glucose and has returned to her baseline mental status.  She is currently undergoing chemotherapy but denies any fevers or chills.  No cough/congestion.  No vomiting or diarrhea.  No dysuria.  She states that her glucose monitor was called into her [place] but she had this episode prior to being able to check on that today.   Past Medical History:  Diagnosis Date   Anemia    Asthma 11/01/2010   only when respiratory infections   BPPV (benign paroxysmal positional vertigo) 03/07/2013   Diabetes mellitus    since 1983   Elevated TSH 05/13/2013   Endometrial cancer ([place])    GERD (gastroesophageal reflux disease)    History of radiation therapy    Vagina- 06/13/22-07/11/22-Dr. Gery Pray   Hypertension    Joint pain 05/26/2022   Left elbow pain 05/05/2016   Left shoulder pain 05/05/2016   Osteomyelitis ([place])    Pneumonia    as a child   Wrist pain 04/11/2013    Review of  Systems  Constitutional: No fever/chills. Positive hypoglycemia.  Cardiovascular: Denies chest pain. Respiratory: Denies shortness of breath. Gastrointestinal: No abdominal pain.  No nausea, no vomiting.  No diarrhea.  No constipation. Genitourinary: Negative for dysuria. Musculoskeletal: Negative for back pain. Skin: Negative for rash. Neurological: Negative for headaches, focal weakness or numbness.   ____________________________________________   PHYSICAL EXAM:  VITAL SIGNS: ED Triage Vitals  Enc Vitals Group     BP 09/03/22 1420 108/75     Pulse Rate 09/03/22 1420 (!) 110     Resp 09/03/22 1420 20     Temp 09/03/22 1420 97.9 F (36.6 C)     Temp Source 09/03/22 1420 Oral     SpO2 09/03/22 1420 100 %     Weight 09/03/22 1415 257 lb (116.6 kg)     Height 09/03/22 1415 '6\' 1"'$  (1.854 m)   Constitutional: Alert and oriented. Well appearing and in no acute distress. Eyes: Conjunctivae are normal. Head: Atraumatic. Nose: No congestion/rhinnorhea. Mouth/Throat: Mucous membranes are moist.   Neck: No stridor.   Cardiovascular: Tachycardia. Good peripheral circulation. Grossly normal heart sounds.   Respiratory: Normal respiratory effort.  No retractions. Lungs CTAB. Gastrointestinal: Soft and nontender. No distention.  Musculoskeletal: No lower extremity tenderness nor edema. No gross deformities of extremities. Neurologic:  Normal speech and language. No gross focal neurologic deficits are appreciated.  Skin:  Skin is warm, dry and intact. No rash noted.  ____________________________________________  LABS (all labs ordered are listed, but only abnormal results are displayed)  Labs Reviewed  CBC WITH DIFFERENTIAL/PLATELET - Abnormal; Notable for the following components:      Result Value   WBC 2.4 (*)    RBC 2.37 (*)    Hemoglobin 8.1 (*)    HCT 25.0 (*)    MCV 105.5 (*)    MCH 34.2 (*)    RDW 18.4 (*)    Platelets 101 (*)    Neutro Abs 1.5 (*)    All other  components within normal limits  BASIC METABOLIC PANEL - Abnormal; Notable for the following components:   Glucose, Bld 115 (*)    BUN 24 (*)    All other components within normal limits  CBG MONITORING, ED - Abnormal; Notable for the following components:   Glucose-Capillary 109 (*)    All other components within normal limits  CBG MONITORING, ED  CBG MONITORING, ED  CBG MONITORING, ED   ____________________________________________  EKG   EKG Interpretation  Date/Time:  Saturday September 03 2022 14:23:28 EDT Ventricular Rate:  114 PR Interval:  143 QRS Duration: 71 QT Interval:  308 QTC Calculation: 425 R Axis:   79 Text Interpretation: Sinus tachycardia Confirmed by Nanda Quinton [phone]) on 09/04/2022 11:05:29 AM        ____________________________________________  RADIOLOGY  No results found.  ____________________________________________   PROCEDURES  Procedure(s) performed:   Procedures  None ____________________________________________   INITIAL IMPRESSION / ASSESSMENT AND PLAN / ED COURSE  Pertinent labs & imaging results that were available during my care of the patient were reviewed by me and considered in my medical decision making (see chart for details).   This patient is Presenting for Evaluation of AMS, which does require a range of treatment options, and is a complaint that involves a high risk of morbidity and mortality.  The Differential Diagnoses includes but is not exclusive to alcohol, illicit or prescription medications, intracranial pathology such as stroke, intracerebral hemorrhage, fever or infectious causes including sepsis, hypoxemia, uremia, trauma, endocrine related disorders such as diabetes, hypoglycemia, thyroid-related diseases, etc.   Critical Interventions-    Medications  sodium chloride 0.9 % bolus 500 mL (0 mLs Intravenous Stopped 09/03/22 1649)  heparin lock flush 100 unit/mL (500 Units Intracatheter Given 09/03/22 1844)     Reassessment after intervention: Symptoms improved with eating.    I did obtain Additional Historical Information from EMS.  I decided to review pertinent External Data, and in summary PCP telephone notes regarding home insulin and monitor ordering reviewed.   Clinical Laboratory Tests Ordered, included BMP without AKI or electrolyte disturbance. CBC with anemia near baseline.   Cardiac Monitor Tracing which shows NSR.    Social Determinants of Health Risk patient is not an active smoker.   Medical Decision Making: Summary:  Patient presents to the ED with weakness and AMS which improved with increased CBG. Patient took insulin at home without meter to check her CBG and dose adjust. Arrives here with normal CBG, no focal neuro deficits, and without active symptoms. Patient took short acting insulin prior to symptoms.   Reevaluation with update and discussion with patient. Monitored in the ED for several hours without additional hypoglycemia episodes. Ordered glucose monitor and strips to pharmacy and patient will pick this up on the way home. Plans to check CBG regularly.   Considered admission but no additional hypoglycemia episodes or Samary Shatz acting insulin administration. Monitor ordered for home use.   Disposition: discharge  ____________________________________________  FINAL CLINICAL IMPRESSION(S) / ED DIAGNOSES  Final diagnoses:  Hypoglycemia    Note:  This document was prepared using Dragon voice recognition software and may include unintentional dictation errors.  Nanda Quinton, MD, [facility] Emergency Medicine    Jilian West, [facility], MD 09/04/22 1110

## 2022-09-03 NOTE — ED Triage Notes (Signed)
Pt BIB [facility] for hypoglycemia with hx of DM type II. They state pt was found during a welfare check to have a CBG of 59 with altered LOC. They admin a total of 45g of oral glucose and got pt's CBG up to 75. Pt became A/Ox4. Pt states she does not remember anything from today and only remembers going to bed last PM. Pt reports difficulty controlling blood sugar since she finished chemo treatments for endometrial CA  2 weeks prior.

## 2022-09-03 NOTE — Discharge Instructions (Signed)
Please continue your home medications including insulin but only after checking your blood sugars at home.  I have called in a blood sugar monitor and supplies to your pharmacy.  Please use this until the primary care team is able to refill your Dexcom.  Return with any new or suddenly worsening symptoms.

## 2022-09-07 ENCOUNTER — Encounter: Payer: Self-pay | Admitting: Family Medicine

## 2022-09-07 ENCOUNTER — Ambulatory Visit (INDEPENDENT_AMBULATORY_CARE_PROVIDER_SITE_OTHER): Payer: Medicaid Other | Admitting: Family Medicine

## 2022-09-07 DIAGNOSIS — E1140 Type 2 diabetes mellitus with diabetic neuropathy, unspecified: Secondary | ICD-10-CM

## 2022-09-07 DIAGNOSIS — E1165 Type 2 diabetes mellitus with hyperglycemia: Secondary | ICD-10-CM

## 2022-09-07 DIAGNOSIS — E119 Type 2 diabetes mellitus without complications: Secondary | ICD-10-CM

## 2022-09-07 DIAGNOSIS — E162 Hypoglycemia, unspecified: Secondary | ICD-10-CM

## 2022-09-07 MED ORDER — SEMAGLUTIDE (1 MG/DOSE) 4 MG/3ML ~~LOC~~ SOPN: PEN_INJECTOR | SUBCUTANEOUS | 2 refills | Status: DC

## 2022-09-07 NOTE — Progress Notes (Signed)
[facility] Internal Medicine Residency Telephone Encounter Continuity Care Appointment  HPI:  This telephone encounter was created for [name] on 09/07/2022 for the following purpose/cc follow-up for hospital visit for hyperglycemia. Patient with history of type 2 diabetes melitis, hypertension, hyperlipidemia, asthma and carcinoma of the endometrium had 2 episodes of hypoglycemia.  First episode happened October 2 when patient were sleeping.  She was unable to move and she thought she had a stroke.  She called her daughter using a voice command phone.  Daughter called 911.  EMS arrived and patient was noted to have a blood sugar of 49.  Patient was given some oral glucose and patient was able to eat and drink.  EMS left the patient home in a stable condition.  Patient had a second episode of hypoglycemia on October 7 where her blood sugar was noted to be 59.  EMS gave her oral glucose and transported to emergency department.  Patient was evaluated and observed and were discharged from the hospital in a stable condition.  Patient misplaced Dexcom glucose monitor and her regular glucose monitor over patient continue to inject Humalog and Lantus which was increased from 30 units to 80 units.  Patient was injecting insulin without monitoring of blood sugar.  The combination of increase in dosage of insulin and patient not eating appropriately due to side effects of semaglutide was most likely the reason for hypoglycemia patient Lantus was increased to 80 units because patient was on steroid treatment during her chemotherapy treatment.  Patient completed chemotherapy on the 21st and steroid treatment 2 weeks ago.       Since patient is discharged from the hospital she is only taking Lantus 80 units and she took Humalog 7 units yesterday.  Patient is feeling well.  She is comfortably communicating.  No acute distress was noted during the telephone encounter.   Past Medical History:  Past Medical  History:  Diagnosis Date   Anemia    Asthma 11/01/2010   only when respiratory infections   BPPV (benign paroxysmal positional vertigo) 03/07/2013   Diabetes mellitus    since 1983   Elevated TSH 05/13/2013   Endometrial cancer ([place])    GERD (gastroesophageal reflux disease)    History of radiation therapy    Vagina- 06/13/22-07/11/22-Dr. Gery Pray   Hypertension    Joint pain 05/26/2022   Left elbow pain 05/05/2016   Left shoulder pain 05/05/2016   Osteomyelitis ([place])    Pneumonia    as a child   Wrist pain 04/11/2013     ROS:  Patient is comfortably communicating.  She denies headache, dizziness, visual changes, chest pain or shortness of breath.  She is comfortably communicating without any notable distress.   Assessment / Plan / Recommendations:  Please see A&P under problem oriented charting for assessment of the patient's acute and chronic medical conditions.  As always, pt is advised that if symptoms worsen or new symptoms arise, they should go to an urgent care facility or to to ER for further evaluation.   Consent and Medical Decision Making:  Patient seen with Dr. Daryll Drown This is a telephone encounter between [name] and Gautam Langhorst on 09/07/2022 for Hypoglycemia and diabetes dosage management. The visit was conducted with the patient located at home and Bennett Ram at [facility]. The patient's identity was confirmed using their DOB and current address. The patient has consented to being evaluated through a telephone encounter and understands the associated risks (an examination cannot  be done and the patient may need to come in for an appointment) / benefits (allows the patient to remain at home, decreasing exposure to coronavirus). I personally spent 30 minutes on medical discussion.

## 2022-09-07 NOTE — Telephone Encounter (Signed)
Checked to be sure [name] got her meter. She says she I going to get it now and will be here in the office this afternoon.

## 2022-09-07 NOTE — Assessment & Plan Note (Addendum)
Patient had to hypoglycemic events as mentioned in the HPI.  From the history I am anticipating her hypoglycemic event secondary to increase in dosage of Lantus from 30 to 80 mg while patient was on steroid while she was undergoing chemotherapy.  Secondly patient was not tolerating increasing dosage of semaglutide from 1.5 mg to 2 mg.  The increase in dosage of semaglutide was cutting down patient's appetite completely and whenever she was trying to eat she were vomiting.  It sleep likely the combination of increase in dosage of Lantus and not eating adequately lead to  hypoglycemic events.  Patient has stopped taking semaglutide.  She started Humalog yesterday and only took at lunchtime before her meal.  She is only taking Lantus 80 units at bedtime.  Since patient is discharged from the hospital she has not experienced hypoglycemic events.  Patient is eating and drinking fine. Plan: Lantus 30 units at bedtime Humalog 7 units 3 times daily before meals Semaglutide 1.5 mg once weekly Follow-up in 3 months. Prescription for semaglutide with new dosage sent to the pharmacy. Dexcom glucometer coupon request was made to [employer]

## 2022-09-09 ENCOUNTER — Telehealth: Payer: Self-pay | Admitting: Family Medicine

## 2022-09-09 ENCOUNTER — Other Ambulatory Visit: Payer: Self-pay | Admitting: Family Medicine

## 2022-09-09 ENCOUNTER — Telehealth: Payer: Self-pay | Admitting: *Deleted

## 2022-09-09 DIAGNOSIS — E119 Type 2 diabetes mellitus without complications: Secondary | ICD-10-CM

## 2022-09-09 MED ORDER — SEMAGLUTIDE (1 MG/DOSE) 4 MG/3ML ~~LOC~~ SOPN: PEN_INJECTOR | SUBCUTANEOUS | 2 refills | Status: DC

## 2022-09-09 NOTE — Progress Notes (Signed)
Pharmacy requested the dosage change for semaglutide is it does not come with dose of 1.5 mg. New prescription was sent to the pharmacy for semaglutide with 1 mg, inject the skin once weekly.

## 2022-09-09 NOTE — Telephone Encounter (Signed)
Received call from [facility]. States Ozempic only comes in 0.5 mg, 1 mg, or 2 mg. Patient is not able to draw up 1.5 mg as written in Sig on 09/07/22. Spoke with Provider and he will resend Rx.

## 2022-09-09 NOTE — Telephone Encounter (Signed)
Patient have misplaced her Dexcom glucose receiver therefore she is not able to monitor her blood sugars however she has a regular glucose monitor which she is using to check her blood sugar.  Patient reports her standing blood sugar is ranging between 150-160.  Patient stated [name] had a coupon for the Dexcom glucose monitor and requested to send that to the pharmacy. I spoke with [name] as patient requested to provide a coupon for Dexcom glucose monitor.  Patient was notified that we do not have the coupon for Dexcom glucose monitor.  Beverlee Nims gave few options that might help.  Advised to download the app on the smart phone to monitor the blood sugar or she can call the company and request to send the receiver and the last option is to buy the receiver.  I have discussed all 3 options with the patient.  Patient stated she has a smart phone and will try to download the app.  Patient also stated she did not get the notification from the pharmacy for semaglutide new prescription.  I reviewed the chart and noted the prescription was successfully sent.  Patient was advised to call the pharmacy and if she has any issues she will let us know and I will send another prescription.  Patient agrees with the plan.

## 2022-09-12 NOTE — Progress Notes (Signed)
Internal Medicine Clinic Attending  Case discussed with Dr. Jeanett Schlein  at the time of the visit.  We reviewed the resident's history and pertinent patient test results.  I agree with the assessment, diagnosis, and plan of care documented in the resident's note.

## 2022-09-13 ENCOUNTER — Other Ambulatory Visit: Payer: Self-pay

## 2022-09-13 ENCOUNTER — Inpatient Hospital Stay: Payer: Medicaid Other | Attending: Gynecologic Oncology

## 2022-09-13 ENCOUNTER — Ambulatory Visit (HOSPITAL_COMMUNITY)
Admission: RE | Admit: 2022-09-13 | Discharge: 2022-09-13 | Disposition: A | Discharge status: Home / Self Care | Payer: Medicaid Other | Source: Ambulatory Visit | Attending: Hematology and Oncology | Admitting: Hematology and Oncology

## 2022-09-13 DIAGNOSIS — C541 Malignant neoplasm of endometrium: Secondary | ICD-10-CM | POA: Diagnosis present

## 2022-09-13 DIAGNOSIS — Z79899 Other long term (current) drug therapy: Secondary | ICD-10-CM | POA: Insufficient documentation

## 2022-09-13 DIAGNOSIS — Z9221 Personal history of antineoplastic chemotherapy: Secondary | ICD-10-CM | POA: Insufficient documentation

## 2022-09-13 DIAGNOSIS — Z8542 Personal history of malignant neoplasm of other parts of uterus: Secondary | ICD-10-CM | POA: Insufficient documentation

## 2022-09-13 DIAGNOSIS — K760 Fatty (change of) liver, not elsewhere classified: Secondary | ICD-10-CM | POA: Insufficient documentation

## 2022-09-13 DIAGNOSIS — Z794 Long term (current) use of insulin: Secondary | ICD-10-CM | POA: Insufficient documentation

## 2022-09-13 DIAGNOSIS — Z90722 Acquired absence of ovaries, bilateral: Secondary | ICD-10-CM | POA: Insufficient documentation

## 2022-09-13 DIAGNOSIS — E1142 Type 2 diabetes mellitus with diabetic polyneuropathy: Secondary | ICD-10-CM | POA: Insufficient documentation

## 2022-09-13 DIAGNOSIS — E11649 Type 2 diabetes mellitus with hypoglycemia without coma: Secondary | ICD-10-CM | POA: Insufficient documentation

## 2022-09-13 DIAGNOSIS — G620 Drug-induced polyneuropathy: Secondary | ICD-10-CM | POA: Insufficient documentation

## 2022-09-13 DIAGNOSIS — D61818 Other pancytopenia: Secondary | ICD-10-CM | POA: Insufficient documentation

## 2022-09-13 DIAGNOSIS — Z90710 Acquired absence of both cervix and uterus: Secondary | ICD-10-CM | POA: Insufficient documentation

## 2022-09-13 DIAGNOSIS — Z923 Personal history of irradiation: Secondary | ICD-10-CM | POA: Insufficient documentation

## 2022-09-13 DIAGNOSIS — E669 Obesity, unspecified: Secondary | ICD-10-CM | POA: Insufficient documentation

## 2022-09-13 DIAGNOSIS — Z7984 Long term (current) use of oral hypoglycemic drugs: Secondary | ICD-10-CM | POA: Insufficient documentation

## 2022-09-13 LAB — CBC WITH DIFFERENTIAL (CANCER CENTER ONLY)
Abs Immature Granulocytes: 0 10*3/uL (ref 0.00–0.07)
Basophils Absolute: 0 10*3/uL (ref 0.0–0.1)
Basophils Relative: 0 %
Eosinophils Absolute: 0 10*3/uL (ref 0.0–0.5)
Eosinophils Relative: 0 %
HCT: 23.4 % — ABNORMAL LOW (ref 36.0–46.0)
Hemoglobin: 7.8 g/dL — ABNORMAL LOW (ref 12.0–15.0)
Immature Granulocytes: 0 %
Lymphocytes Relative: 53 %
Lymphs Abs: 1.5 10*3/uL (ref 0.7–4.0)
MCH: 35.5 pg — ABNORMAL HIGH (ref 26.0–34.0)
MCHC: 33.3 g/dL (ref 30.0–36.0)
MCV: 106.4 fL — ABNORMAL HIGH (ref 80.0–100.0)
Monocytes Absolute: 0.2 10*3/uL (ref 0.1–1.0)
Monocytes Relative: 6 %
Neutro Abs: 1.2 10*3/uL — ABNORMAL LOW (ref 1.7–7.7)
Neutrophils Relative %: 41 %
Platelet Count: 107 10*3/uL — ABNORMAL LOW (ref 150–400)
RBC: 2.2 MIL/uL — ABNORMAL LOW (ref 3.87–5.11)
RDW: 19.1 % — ABNORMAL HIGH (ref 11.5–15.5)
WBC Count: 2.9 10*3/uL — ABNORMAL LOW (ref 4.0–10.5)
nRBC: 0 % (ref 0.0–0.2)

## 2022-09-13 LAB — CMP (CANCER CENTER ONLY)
ALT: 11 U/L (ref 0–44)
AST: 13 U/L — ABNORMAL LOW (ref 15–41)
Albumin: 4 g/dL (ref 3.5–5.0)
Alkaline Phosphatase: 87 U/L (ref 38–126)
Anion gap: 8 (ref 5–15)
BUN: 11 mg/dL (ref 6–20)
CO2: 25 mmol/L (ref 22–32)
Calcium: 8.9 mg/dL (ref 8.9–10.3)
Chloride: 110 mmol/L (ref 98–111)
Creatinine: 0.73 mg/dL (ref 0.44–1.00)
GFR, Estimated: >60 mL/min (ref >60)
Glucose, Bld: 133 mg/dL — ABNORMAL HIGH (ref 70–99)
Potassium: 4.2 mmol/L (ref 3.5–5.1)
Sodium: 143 mmol/L (ref 135–145)
Total Bilirubin: 0.3 mg/dL (ref 0.3–1.2)
Total Protein: 6.6 g/dL (ref 6.5–8.1)

## 2022-09-13 MED ORDER — HEPARIN SOD (PORK) LOCK FLUSH 100 UNIT/ML IV SOLN
INTRAVENOUS | Status: AC
  Administered 2022-09-13: 500 [IU]
  Filled 2022-09-13: qty 5

## 2022-09-13 MED ORDER — SODIUM CHLORIDE 0.9% FLUSH
10.0000 mL | Freq: Once | INTRAVENOUS | Status: AC
  Administered 2022-09-13: 10 mL

## 2022-09-13 MED ORDER — SODIUM CHLORIDE (PF) 0.9 % IJ SOLN
INTRAMUSCULAR | Status: AC
  Filled 2022-09-13: qty 50

## 2022-09-13 MED ORDER — IOHEXOL 300 MG/ML  SOLN
100.0000 mL | Freq: Once | INTRAMUSCULAR | Status: AC | PRN
  Administered 2022-09-13: 100 mL via INTRAVENOUS

## 2022-09-15 ENCOUNTER — Encounter: Payer: Self-pay | Admitting: Hematology and Oncology

## 2022-09-15 ENCOUNTER — Inpatient Hospital Stay (HOSPITAL_BASED_OUTPATIENT_CLINIC_OR_DEPARTMENT_OTHER): Payer: Medicaid Other | Admitting: Hematology and Oncology

## 2022-09-15 ENCOUNTER — Other Ambulatory Visit: Payer: Self-pay

## 2022-09-15 VITALS — BP 109/61 | HR 95 | Temp 97.7°F | Resp 18 | Ht 73.0 in | Wt 262.6 lb

## 2022-09-15 DIAGNOSIS — K760 Fatty (change of) liver, not elsewhere classified: Secondary | ICD-10-CM | POA: Diagnosis not present

## 2022-09-15 DIAGNOSIS — Z8542 Personal history of malignant neoplasm of other parts of uterus: Secondary | ICD-10-CM | POA: Diagnosis present

## 2022-09-15 DIAGNOSIS — Z9221 Personal history of antineoplastic chemotherapy: Secondary | ICD-10-CM | POA: Diagnosis not present

## 2022-09-15 DIAGNOSIS — E11649 Type 2 diabetes mellitus with hypoglycemia without coma: Secondary | ICD-10-CM | POA: Diagnosis not present

## 2022-09-15 DIAGNOSIS — D61818 Other pancytopenia: Secondary | ICD-10-CM | POA: Diagnosis not present

## 2022-09-15 DIAGNOSIS — Z7984 Long term (current) use of oral hypoglycemic drugs: Secondary | ICD-10-CM | POA: Diagnosis not present

## 2022-09-15 DIAGNOSIS — Z794 Long term (current) use of insulin: Secondary | ICD-10-CM | POA: Diagnosis not present

## 2022-09-15 DIAGNOSIS — C541 Malignant neoplasm of endometrium: Secondary | ICD-10-CM | POA: Diagnosis not present

## 2022-09-15 DIAGNOSIS — E1340 Other specified diabetes mellitus with diabetic neuropathy, unspecified: Secondary | ICD-10-CM

## 2022-09-15 DIAGNOSIS — G620 Drug-induced polyneuropathy: Secondary | ICD-10-CM | POA: Diagnosis not present

## 2022-09-15 DIAGNOSIS — Z90710 Acquired absence of both cervix and uterus: Secondary | ICD-10-CM | POA: Diagnosis not present

## 2022-09-15 DIAGNOSIS — Z79899 Other long term (current) drug therapy: Secondary | ICD-10-CM | POA: Diagnosis not present

## 2022-09-15 DIAGNOSIS — Z90722 Acquired absence of ovaries, bilateral: Secondary | ICD-10-CM | POA: Diagnosis not present

## 2022-09-15 DIAGNOSIS — E669 Obesity, unspecified: Secondary | ICD-10-CM | POA: Diagnosis not present

## 2022-09-15 DIAGNOSIS — E1142 Type 2 diabetes mellitus with diabetic polyneuropathy: Secondary | ICD-10-CM | POA: Diagnosis not present

## 2022-09-15 DIAGNOSIS — Z923 Personal history of irradiation: Secondary | ICD-10-CM | POA: Diagnosis not present

## 2022-09-15 NOTE — Assessment & Plan Note (Signed)
I have reviewed CT imaging with the patient The lesions in the liver is most consistent with hemangioma She has fluid in her pelvis likely postoperative changes I am not too concerned about venous congestion with the gonadal vessels I recommend repeat CT imaging in January I will bring her back in about 6 weeks for further follow-up and repeat blood work

## 2022-09-15 NOTE — Assessment & Plan Note (Signed)
This is due to obesity and diabetes The liver lesions appears to be consistent with hemangioma We discussed the risk and benefits of MRI studies and the patient decline but agrees for CT imaging in 3 months

## 2022-09-15 NOTE — Assessment & Plan Note (Signed)
She has persistent pancytopenia due to recent chemotherapy She is not symptomatic She does not need transfusion support I plan to recheck it again in 6 weeks with additional iron studies and vitamin B12 level

## 2022-09-15 NOTE — Progress Notes (Signed)
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[facility]) I have reviewed CT imaging with the patient The lesions in the liver is most consistent with hemangioma She has fluid in her pelvis likely postoperative changes I am not too concerned about venous congestion with the gonadal vessels I recommend repeat CT imaging in January I will bring her back in about 6 weeks for further follow-up and repeat blood work  Pancytopenia, acquired ([place]) She has persistent pancytopenia due to recent chemotherapy She is not symptomatic She does not need transfusion support I plan to recheck it again in 6 weeks with additional iron studies and vitamin B12 level  Hepatic steatosis This is due to obesity and diabetes The liver lesions appears to be consistent with hemangioma We discussed the risk and benefits of MRI studies and the patient decline but agrees for CT imaging in 3 months  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[place]) The cause of her neuropathy is multifactorial, likely exacerbated by recent chemotherapy She will continue gabapentin We discussed importance of dietary modification and better control of diabetes  Orders Placed This Encounter  Procedures   Comprehensive metabolic panel    Standing Status:   Standing    Number of Occurrences:   33    Standing Expiration Date:   09/16/2023   CBC with Differential/Platelet    Standing Status:   Standing    Number of Occurrences:   22    Standing Expiration Date:   09/16/2023   Iron and Iron Binding Capacity ([facility] only)    Standing Status:   Future    Standing Expiration Date:   09/16/2023   Ferritin    Standing Status:   Future    Standing Expiration Date:   09/15/2023   Vitamin B12    Standing Status:   Future    Standing Expiration Date:   09/16/2023    All questions were answered. The patient knows to  call the clinic with any problems, questions or concerns. The total time spent in the appointment was 30 minutes encounter with patients including review of chart and various tests results, discussions about plan of care and coordination of care plan   Heath Lark, MD 09/15/2022 10:29 AM  INTERVAL HISTORY: Please see below for problem oriented charting. she returns for treatment follow-up and review of test results She had recurrent syncopal episode due to severe hypoglycemia due to inconsistent oral intake She has some peripheral neuropathy especially in the fingers that bothers her Denies abdominal pain or changes in bowel habits  REVIEW OF SYSTEMS:   Constitutional: Denies fevers, chills or abnormal weight loss Eyes: Denies blurriness of vision Ears, nose, mouth, throat, and face: Denies mucositis or sore throat Respiratory: Denies cough, dyspnea or wheezes Cardiovascular: Denies palpitation, chest discomfort or lower extremity swelling Gastrointestinal:  Denies nausea, heartburn or change in bowel habits Skin: Denies abnormal skin rashes Lymphatics: Denies new lymphadenopathy or easy bruising Behavioral/Psych: Mood is stable, no new changes  All other systems were reviewed with the patient and are negative.  I have reviewed the past medical history, past surgical history, social history and family history with the patient and they are unchanged from previous note.  ALLERGIES:  is allergic to nsaids and penicillins.  MEDICATIONS:  Current Outpatient Medications  Medication Sig Dispense Refill   Accu-Chek Softclix Lancets lancets CHECK BLOOD SUGAR 4 TIMES A DAY BEFORE  MEALS AND BEDTIME 204 each 10   acetaminophen (TYLENOL) 650 MG CR tablet Take 1,300-1,950 mg by mouth every 8 (eight) hours as needed for pain.     atorvastatin (LIPITOR) 80 MG tablet Take 1 tablet (80 mg total) by mouth daily. 90 tablet 3   Blood Glucose Monitoring Suppl (ACCU-CHEK GUIDE) w/Device KIT CHECK BLOOD  SUGAR 4 TIMES A DAY BEFORE MEALS AND BEDTIME 1 kit 1   Blood Pressure Monitoring (BLOOD PRESSURE KIT) DEVI Use to check blood pressure daily 1 each 0   gabapentin (NEURONTIN) 300 MG capsule TAKE 3 CAPSULES BY MOUTH THREE TIMES DAILY 270 capsule 3   glucose blood (ACCU-CHEK GUIDE) test strip Check blood sugar 4 times per day 125 each 12   insulin glargine (LANTUS SOLOSTAR) 100 UNIT/ML Solostar Pen Inject 30 Units into the skin at bedtime. 9 mL 2   insulin lispro (HUMALOG) 100 UNIT/ML injection Inject 0.07 mLs (7 Units total) into the skin 3 (three) times daily with meals. 10 mL 3   Insulin Syringe-Needle U-100 31G X 15/64" 0.5 ML MISC 1 Syringe by Does not apply route 3 (three) times daily. 50 each 3   lisinopril (ZESTRIL) 20 MG tablet Take 1 tablet (20 mg total) by mouth daily. 30 tablet 11   meclizine (ANTIVERT) 25 MG tablet Take 1 tablet (25 mg total) by mouth 2 (two) times daily as needed for dizziness. 60 tablet 3   metFORMIN (GLUCOPHAGE-XR) 500 MG 24 hr tablet Take 4 tablets (2,000 mg total) by mouth at bedtime. 360 tablet 3   Multiple Vitamin (MULTIVITAMIN WITH MINERALS) TABS tablet Take 1 tablet by mouth every evening.      Nutritional Supplements ([place]) LIQD Take 1 bottle twice daily between meals 237 mL 20   omeprazole (PRILOSEC) 20 MG capsule Take 1 capsule (20 mg total) by mouth daily. (Patient taking differently: Take 20 mg by mouth daily as needed (acid reflux).) 30 capsule 1   Semaglutide, 1 MG/DOSE, 4 MG/3ML SOPN Inject 1 mg in the skin once weekly 3 mL 2   traZODone (DESYREL) 50 MG tablet TAKE 1 TABLET BY MOUTH AT BEDTIME AS NEEDED FOR SLEEP 30 tablet 2   No current facility-administered medications for this visit.    SUMMARY OF ONCOLOGIC HISTORY: Oncology History Overview Note  P53 wild type   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([place])  02/01/2022 Initial Diagnosis   She presented with postmenopausal bleeding   02/07/2022 Imaging   US pelvis Thickened  heterogeneous endometrium with masslike region with internal vascularity. Findings may be associated with endometrial carcinoma or hyperplasia. Biopsy is recommended for further evaluation.   02/22/2022 Initial Biopsy   EMB: MMMT/carcinosarcoma (predominance of sarcomatous component)   03/05/2022 Imaging   Ct chest, abdomen and pelvis 1. Heterogeneous enlargement of the endometrial cavity is compatible with the reported history of uterine carcinosarcoma. No definite involvement of the posterior bladder wall or sigmoid colon/rectum. 2. 9 mm short axis right external iliac node is upper normal for size. Attention on follow-up recommended. Otherwise no lymphadenopathy in the abdomen or pelvis. 3. 3.6 x 2.7 cm lesion in the lateral segment left liver has subtle peripheral nodular enhancement. This is almost certainly a benign cavernous hemangioma, but MRI abdomen with and without contrast recommended to confirm. 4. Aortic Atherosclerosis (ICD10-I70.0).   03/08/2022 Initial Diagnosis   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([place])   03/30/2022 Surgery   TRH/BSO, SLN biopsy on right, left pelvic LND, LOS, mini-lap  Findings: On EUA, 12cm minimally mobile  uterus. On intra-abdominal exam, normal upper abdominal survey. Omentum adherent to the anterior abdominal wall along the prior midline incision. Normal omentum otherwise, normal small and large bowel. 12 cm uterus densely adherent to the anterior abdominal wall, obliterating some of the anterior anatomy including the anterior cul de sac. Normal appearing bilateral adnexa. Mapping successful to right obturator SLN, mildly enlarged. Dye seen within the parametrium on the left, no SLNs identified. No obvious adenopathy on the left. Decision made given length of surgery, comorbitidies to defer left para-aortic lymphadenectomy. Mini-lap required for specimen delivery. Dome intact on cystoscopy and good efflux noted from bilateral ureteral orifices.   03/30/2022 Pathology  Results   MMMT/carcinosarcoma, 6.3 cm MI 1cm or 3 (<50%) Cervical stroma, bilateral tubes/ovaries benign R SLN and L pelvic LNDs negative  ONCOLOGY TABLE:   UTERUS, CARCINOMA OR CARCINOSARCOMA: Resection   Procedure: Total hysterectomy and bilateral salpingo-oophorectomy  Histologic Type:  Malignant mixed Mullerian tumor (MMMT/ carcinosarcoma)  Histologic Grade: High-grade  Myometrial Invasion:       Depth of Myometrial Invasion (mm): 10 mm       Myometrial Thickness (mm): 30 mm       Percentage of Myometrial Invasion: 33%  Uterine Serosa Involvement: Not identified  Cervical stromal Involvement: Not identified  Extent of involvement of other tissue/organs: Not identified  Peritoneal/Ascitic Fluid: Not applicable  Lymphovascular Invasion: Not identified  Regional Lymph Nodes:       Pelvic Lymph Nodes Examined:                                   1 Sentinel                                   6 Non-sentinel                                   7 Total       Pelvic Lymph Nodes with Metastasis: 0                           Macrometastasis: (>2.0 mm): 0                           Micrometastasis: (>0.2 mm and < 2.0 mm): 0                           Isolated Tumor Cells (<0.2 mm): 0                           Laterality of Lymph Node with Tumor: Not  applicable                           Extracapsular Extension: Not applicable       Para-aortic Lymph Nodes Examined:                                    0 Sentinel  0 non-sentinel                                    0 total  Distant Metastasis:       Distant Site(s) Involved: Not applicable  Pathologic Stage Classification (pTNM, AJCC 8th Edition): pT1a, pN0  Ancillary Studies: MMR / MSI testing will be ordered  Representative Tumor Block: B1  Comment(s): Pancytokeratin was performed on the lymph nodes and is  negative.    04/08/2022 Cancer Staging   Staging form: Corpus Uteri - Carcinoma and Carcinosarcoma,  AJCC 8th Edition - Pathologic stage from 04/08/2022: FIGO Stage IA (pT1a, pN0, cM0) - Signed by Heath Lark, MD on 04/08/2022 Stage prefix: Initial diagnosis   04/21/2022 Procedure   Placement of single lumen port a cath via right internal jugular vein. The catheter tip lies at the cavo-atrial junction. A power injectable port a cath was placed and is ready for immediate use.       04/29/2022 - 08/18/2022 Chemotherapy   Patient is on Treatment Plan : UTERINE Carboplatin AUC 6 / Paclitaxel q21d     09/14/2022 Imaging   Narrative & Impression   EXAM: CT ABDOMEN AND PELVIS WITH CONTRAST   TECHNIQUE: Multidetector CT imaging of the abdomen and pelvis was performed using the standard protocol following bolus administration of intravenous contrast.   RADIATION DOSE REDUCTION: This exam was performed according to the departmental dose-optimization program which includes automated exposure control, adjustment of the mA and/or kV according to patient size and/or use of iterative reconstruction technique.   CONTRAST:  117m OMNIPAQUE IOHEXOL 300 MG/ML  SOLN   COMPARISON:  March 04, 2022   FINDINGS: Lower chest: Basilar atelectasis. No effusion or consolidative changes.   Hepatobiliary: LEFT hepatic lobe lesion at 3.4 cm is unchanged since April of 2023 with peripheral and nodular enhancement and centripetal filling noted on previous imaging compatible with large hepatic hemangioma.   Small hypodensity in the posterior RIGHT hemiliver not definitely seen on previous imaging (image 19/2) 8 mm, perhaps present on the prior study but masked by artifact from the patient's arm.   Low-density focus in the posterior RIGHT hemiliver, hepatic subsegment VII (image 16/2) 11 mm, this is stable. There is background hepatic steatosis.   Subtle area of low attenuation in the RIGHT hemiliver (image 31/2) 9 mm. Portal vein is patent. No pericholecystic stranding or signs of biliary duct distension.   Pancreas:  Normal, without mass, inflammation or ductal dilatation.   Spleen: Normal.   Adrenals/Urinary Tract:   Adrenal glands are unremarkable. Symmetric renal enhancement. No sign of hydronephrosis. No suspicious renal lesion or perinephric stranding.   Urinary bladder is grossly unremarkable.   Stomach/Bowel: No acute gastrointestinal findings. Appendix is normal.   Vascular/Lymphatic:   Aortic atherosclerosis. No sign of aneurysm. Smooth contour of the IVC. There is no gastrohepatic or hepatoduodenal ligament lymphadenopathy. No retroperitoneal or mesenteric lymphadenopathy.   No pelvic sidewall lymphadenopathy.   Mild expansion of the RIGHT ovarian vein with central low attenuation that suggests thrombus within ligated ovarian vein. Delayed images without signs of propagation into the IVC.   Reproductive: Post hysterectomy. Small amount of fluid in the surgical bed (image 81/2) no peripheral enhancement. This area measuring 4.2 x 1.9 cm. Scattered areas of fluid density which are smaller, other areas less than a cm. Mild fascial thickening along the RIGHT pelvic sidewall. (Image 75/2) mild serosal thickening of the  sigmoid colon is suggested. No peritoneal nodularity outside of the pelvis.   Other: As above   Musculoskeletal: No acute bone finding. No destructive bone process. Spinal degenerative changes.   IMPRESSION: 1. Post hysterectomy. Small amount of fluid in the surgical bed no peripheral enhancement. This area measuring 4.2 x 1.9 cm. This and other findings in the pelvis such as serosal thickening along the sigmoid and fascial thickening along the RIGHT pelvis may reflect postoperative changes. Would suggest close attention on follow-up to exclude the possibility of peritoneal involvement. 2. Mild expansion of the RIGHT ovarian vein with central low attenuation suggests ovarian venous thrombus. 3. Hepatic lesions 2 of which were definitively present on prior imaging in hepatic  subsegment VII and in the lateral segment of the LEFT hepatic lobe, lateral segment LEFT hepatic lobe lesion is compatible with benign hemangioma. There are 2 lesions which may be new and or mast on the prior study by artifact and are indeterminate. Suggest hepatic MRI for further assessment. At the time of hepatic MRI, MRV sequences could be obtained if warranted to assess for RIGHT ovarian venous thrombosis. 4. Background hepatic steatosis. 5. Aortic atherosclerosis, mild.       PHYSICAL EXAMINATION: ECOG PERFORMANCE STATUS: 1 - Symptomatic but completely ambulatory  Vitals:   09/15/22 1002  BP: 109/61  Pulse: 95  Resp: 18  Temp: 97.7 F (36.5 C)  SpO2: 100%   Filed Weights   09/15/22 1002  Weight: 262 lb 9.6 oz (119.1 kg)    GENERAL:alert, no distress and comfortable NEURO: alert & oriented x 3 with fluent speech, no focal motor/sensory deficits  LABORATORY DATA:  I have reviewed the data as listed    Component Value Date/Time   NA 143 09/13/2022 0812   NA 139 09/22/2021 0954   K 4.2 09/13/2022 0812   CL 110 09/13/2022 0812   CO2 25 09/13/2022 0812   GLUCOSE 133 (H) 09/13/2022 0812   BUN 11 09/13/2022 0812   BUN 13 09/22/2021 0954   CREATININE 0.73 09/13/2022 0812   CREATININE 0.67 04/23/2015 1645   CALCIUM 8.9 09/13/2022 0812   PROT 6.6 09/13/2022 0812   PROT 7.3 09/22/2021 0954   ALBUMIN 4.0 09/13/2022 0812   ALBUMIN 4.4 09/22/2021 0954   AST 13 (L) 09/13/2022 0812   ALT 11 09/13/2022 0812   ALKPHOS 87 09/13/2022 0812   BILITOT 0.3 09/13/2022 0812   GFRNONAA >60 09/13/2022 0812   GFRNONAA >89 01/06/2014 1643   GFRAA >60 02/25/2020 0334   GFRAA >89 01/06/2014 1643    No results found for: "SPEP", "UPEP"  Lab Results  Component Value Date   WBC 2.9 (L) 09/13/2022   NEUTROABS 1.2 (L) 09/13/2022   HGB 7.8 (L) 09/13/2022   HCT 23.4 (L) 09/13/2022   MCV 106.4 (H) 09/13/2022   PLT 107 (L) 09/13/2022      Chemistry      Component Value Date/Time    NA 143 09/13/2022 0812   NA 139 09/22/2021 0954   K 4.2 09/13/2022 0812   CL 110 09/13/2022 0812   CO2 25 09/13/2022 0812   BUN 11 09/13/2022 0812   BUN 13 09/22/2021 0954   CREATININE 0.73 09/13/2022 0812   CREATININE 0.67 04/23/2015 1645      Component Value Date/Time   CALCIUM 8.9 09/13/2022 0812   ALKPHOS 87 09/13/2022 0812   AST 13 (L) 09/13/2022 0812   ALT 11 09/13/2022 0812   BILITOT 0.3 09/13/2022 6269  RADIOGRAPHIC STUDIES: I have personally reviewed the radiological images as listed and agreed with the findings in the report. CT ABDOMEN PELVIS W CONTRAST  Result Date: 09/14/2022 CLINICAL DATA:  A 61 year old female presents for follow-up of endometrial carcinosarcoma. * Tracking Code: BO * EXAM: CT ABDOMEN AND PELVIS WITH CONTRAST TECHNIQUE: Multidetector CT imaging of the abdomen and pelvis was performed using the standard protocol following bolus administration of intravenous contrast. RADIATION DOSE REDUCTION: This exam was performed according to the departmental dose-optimization program which includes automated exposure control, adjustment of the mA and/or kV according to patient size and/or use of iterative reconstruction technique. CONTRAST:  189m OMNIPAQUE IOHEXOL 300 MG/ML  SOLN COMPARISON:  March 04, 2022 FINDINGS: Lower chest: Basilar atelectasis. No effusion or consolidative changes. Hepatobiliary: LEFT hepatic lobe lesion at 3.4 cm is unchanged since April of 2023 with peripheral and nodular enhancement and centripetal filling noted on previous imaging compatible with large hepatic hemangioma. Small hypodensity in the posterior RIGHT hemiliver not definitely seen on previous imaging (image 19/2) 8 mm, perhaps present on the prior study but masked by artifact from the patient's arm. Low-density focus in the posterior RIGHT hemiliver, hepatic subsegment VII (image 16/2) 11 mm, this is stable. There is background hepatic steatosis. Subtle area of low attenuation in  the RIGHT hemiliver (image 31/2) 9 mm. Portal vein is patent. No pericholecystic stranding or signs of biliary duct distension. Pancreas: Normal, without mass, inflammation or ductal dilatation. Spleen: Normal. Adrenals/Urinary Tract: Adrenal glands are unremarkable. Symmetric renal enhancement. No sign of hydronephrosis. No suspicious renal lesion or perinephric stranding. Urinary bladder is grossly unremarkable. Stomach/Bowel: No acute gastrointestinal findings. Appendix is normal. Vascular/Lymphatic: Aortic atherosclerosis. No sign of aneurysm. Smooth contour of the IVC. There is no gastrohepatic or hepatoduodenal ligament lymphadenopathy. No retroperitoneal or mesenteric lymphadenopathy. No pelvic sidewall lymphadenopathy. Mild expansion of the RIGHT ovarian vein with central low attenuation that suggests thrombus within ligated ovarian vein. Delayed images without signs of propagation into the IVC. Reproductive: Post hysterectomy. Small amount of fluid in the surgical bed (image 81/2) no peripheral enhancement. This area measuring 4.2 x 1.9 cm. Scattered areas of fluid density which are smaller, other areas less than a cm. Mild fascial thickening along the RIGHT pelvic sidewall. (Image 75/2) mild serosal thickening of the sigmoid colon is suggested. No peritoneal nodularity outside of the pelvis. Other: As above Musculoskeletal: No acute bone finding. No destructive bone process. Spinal degenerative changes. IMPRESSION: 1. Post hysterectomy. Small amount of fluid in the surgical bed no peripheral enhancement. This area measuring 4.2 x 1.9 cm. This and other findings in the pelvis such as serosal thickening along the sigmoid and fascial thickening along the RIGHT pelvis may reflect postoperative changes. Would suggest close attention on follow-up to exclude the possibility of peritoneal involvement. 2. Mild expansion of the RIGHT ovarian vein with central low attenuation suggests ovarian venous thrombus. 3.  Hepatic lesions 2 of which were definitively present on prior imaging in hepatic subsegment VII and in the lateral segment of the LEFT hepatic lobe, lateral segment LEFT hepatic lobe lesion is compatible with benign hemangioma. There are 2 lesions which may be new and or mast on the prior study by artifact and are indeterminate. Suggest hepatic MRI for further assessment. At the time of hepatic MRI, MRV sequences could be obtained if warranted to assess for RIGHT ovarian venous thrombosis. 4. Background hepatic steatosis. 5. Aortic atherosclerosis, mild. Aortic Atherosclerosis (ICD10-I70.0). These results will be called to the ordering clinician or  representative by the Radiologist Assistant, and communication documented in the PACS or [employer]. Electronically Signed   By: Zetta Bills M.D.   On: 09/14/2022 11:11

## 2022-09-15 NOTE — Assessment & Plan Note (Signed)
The cause of her neuropathy is multifactorial, likely exacerbated by recent chemotherapy She will continue gabapentin We discussed importance of dietary modification and better control of diabetes

## 2022-09-19 ENCOUNTER — Encounter: Payer: Self-pay | Admitting: Gynecologic Oncology

## 2022-09-20 ENCOUNTER — Inpatient Hospital Stay (HOSPITAL_BASED_OUTPATIENT_CLINIC_OR_DEPARTMENT_OTHER): Payer: Medicaid Other | Admitting: Gynecologic Oncology

## 2022-09-20 ENCOUNTER — Encounter: Payer: Self-pay | Admitting: Gynecologic Oncology

## 2022-09-20 VITALS — BP 121/56 | HR 90 | Temp 98.6°F | Resp 17 | Ht 73.0 in | Wt 264.4 lb

## 2022-09-20 DIAGNOSIS — Z8542 Personal history of malignant neoplasm of other parts of uterus: Secondary | ICD-10-CM

## 2022-09-20 DIAGNOSIS — C541 Malignant neoplasm of endometrium: Secondary | ICD-10-CM

## 2022-09-20 NOTE — Progress Notes (Signed)
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 Findings may be associated with endometrial carcinoma or hyperplasia. Biopsy is recommended for further evaluation.   02/22/2022 Initial Biopsy   EMB: MMMT/carcinosarcoma (predominance of sarcomatous component)   03/05/2022 Imaging   Ct chest, abdomen and pelvis 1. Heterogeneous enlargement of the endometrial cavity is compatible with the reported history of uterine carcinosarcoma. No definite involvement of the posterior bladder wall or sigmoid colon/rectum. 2. 9 mm short axis right external iliac node is upper normal for size. Attention on follow-up recommended. Otherwise no lymphadenopathy in the abdomen or pelvis. 3. 3.6 x 2.7 cm lesion in the lateral segment left liver has subtle peripheral nodular enhancement. This is almost certainly a benign cavernous hemangioma, but MRI abdomen with and without contrast recommended to confirm. 4. Aortic Atherosclerosis (ICD10-I70.0).   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 On intra-abdominal exam, normal upper abdominal survey. Omentum adherent to the anterior abdominal wall along the prior midline incision. Normal omentum otherwise, normal small and large bowel. 12 cm uterus densely adherent to the anterior abdominal wall, obliterating some of the anterior anatomy including the anterior cul de sac. Normal appearing bilateral adnexa. Mapping successful to right obturator  SLN, mildly enlarged. Dye seen within the parametrium on the left, no SLNs identified. No obvious adenopathy on the left. Decision made given length of surgery, comorbitidies to defer left para-aortic lymphadenectomy. Mini-lap required for specimen delivery. Dome intact on cystoscopy and good efflux noted from bilateral ureteral orifices.   03/30/2022 Pathology Results   MMMT/carcinosarcoma, 6.3 cm MI 1cm or 3 (<50%) Cervical stroma, bilateral tubes/ovaries benign R SLN and L pelvic LNDs negative  ONCOLOGY TABLE:   UTERUS, CARCINOMA OR CARCINOSARCOMA: Resection   Procedure: Total hysterectomy and bilateral salpingo-oophorectomy  Histologic Type:  Malignant mixed Mullerian tumor (MMMT/ carcinosarcoma)  Histologic Grade: High-grade  Myometrial Invasion:       Depth of Myometrial Invasion (mm): 10 mm       Myometrial Thickness (mm): 30 mm       Percentage of Myometrial Invasion: 33%  Uterine Serosa Involvement: Not identified  Cervical stromal Involvement: Not identified  Extent of involvement of other tissue/organs: Not identified  Peritoneal/Ascitic Fluid: Not applicable  Lymphovascular Invasion: Not identified  Regional Lymph Nodes:       Pelvic Lymph Nodes Examined:                                   1 Sentinel                                   6 Non-sentinel                                   7 Total       Pelvic Lymph Nodes with  Metastasis: 0                           Macrometastasis: (>2.0 mm): 0                           Micrometastasis: (>0.2 mm and < 2.0 mm): 0                           Isolated Tumor Cells (<0.2 mm): 0                           Laterality of Lymph Node with Tumor: Not  applicable                           Extracapsular Extension: Not applicable       Para-aortic Lymph Nodes Examined:                                    0 Sentinel                                    0 non-sentinel                                    0 total  Distant Metastasis:       Distant  Site(s) Involved: Not applicable  Pathologic Stage Classification (pTNM, AJCC 8th Edition): pT1a, pN0  Ancillary Studies: MMR / MSI testing will be ordered  Representative Tumor Block: B1  Comment(s): Pancytokeratin was performed on the lymph nodes and is  negative.    04/08/2022 Cancer Staging   Staging form: Corpus Uteri - Carcinoma and Carcinosarcoma, AJCC 8th Edition - Pathologic stage from 04/08/2022: FIGO Stage IA (pT1a, pN0, cM0) - Signed by Heath Lark, MD on 04/08/2022 Stage prefix: Initial diagnosis   04/21/2022 Procedure   Placement of single lumen port a cath via right internal jugular vein. The catheter tip lies at the cavo-atrial junction. A power injectable port a cath was placed and is ready for immediate use.       04/29/2022 - 08/18/2022 Chemotherapy   Patient is on Treatment Plan : UTERINE Carboplatin AUC 6 / Paclitaxel q21d     06/13/2022 - 07/11/2022 Radiation Therapy   06/13/2022 through 07/11/2022 Site Technique Total Dose (Gy) Dose per Fx (Gy) Completed Fx Beam Energies  Vagina: Pelvis HDR-brachy 30/30 6 5/5 Ir-192    09/14/2022 Imaging   Narrative & Impression   EXAM: CT ABDOMEN AND PELVIS WITH CONTRAST   TECHNIQUE: Multidetector CT imaging of the abdomen and pelvis was performed using the standard protocol following bolus administration of intravenous contrast.   RADIATION DOSE REDUCTION: This exam was performed according to the departmental dose-optimization program which includes automated exposure control, adjustment of the mA and/or kV according to patient size and/or use of iterative reconstruction technique.   CONTRAST:  123m OMNIPAQUE IOHEXOL 300 MG/ML  SOLN   COMPARISON:  March 04, 2022   FINDINGS: Lower chest: Basilar atelectasis. No effusion or consolidative changes.   Hepatobiliary: LEFT hepatic lobe lesion at 3.4 cm is unchanged since  April of 2023 with peripheral and nodular enhancement and centripetal filling noted on previous imaging  compatible with large hepatic hemangioma.   Small hypodensity in the posterior RIGHT hemiliver not definitely seen on previous imaging (image 19/2) 8 mm, perhaps present on the prior study but masked by artifact from the patient's arm.   Low-density focus in the posterior RIGHT hemiliver, hepatic subsegment VII (image 16/2) 11 mm, this is stable. There is background hepatic steatosis.   Subtle area of low attenuation in the RIGHT hemiliver (image 31/2) 9 mm. Portal vein is patent. No pericholecystic stranding or signs of biliary duct distension.   Pancreas: Normal, without mass, inflammation or ductal dilatation.   Spleen: Normal.   Adrenals/Urinary Tract:   Adrenal glands are unremarkable. Symmetric renal enhancement. No sign of hydronephrosis. No suspicious renal lesion or perinephric stranding.   Urinary bladder is grossly unremarkable.   Stomach/Bowel: No acute gastrointestinal findings. Appendix is normal.   Vascular/Lymphatic:   Aortic atherosclerosis. No sign of aneurysm. Smooth contour of the IVC. There is no gastrohepatic or hepatoduodenal ligament lymphadenopathy. No retroperitoneal or mesenteric lymphadenopathy.   No pelvic sidewall lymphadenopathy.   Mild expansion of the RIGHT ovarian vein with central low attenuation that suggests thrombus within ligated ovarian vein. Delayed images without signs of propagation into the IVC.   Reproductive: Post hysterectomy. Small amount of fluid in the surgical bed (image 81/2) no peripheral enhancement. This area measuring 4.2 x 1.9 cm. Scattered areas of fluid density which are smaller, other areas less than a cm. Mild fascial thickening along the RIGHT pelvic sidewall. (Image 75/2) mild serosal thickening of the sigmoid colon is suggested. No peritoneal nodularity outside of the pelvis.   Other: As above   Musculoskeletal: No acute bone finding. No destructive bone process. Spinal degenerative changes.   IMPRESSION: 1. Post  hysterectomy. Small amount of fluid in the surgical bed no peripheral enhancement. This area measuring 4.2 x 1.9 cm. This and other findings in the pelvis such as serosal thickening along the sigmoid and fascial thickening along the RIGHT pelvis may reflect postoperative changes. Would suggest close attention on follow-up to exclude the possibility of peritoneal involvement. 2. Mild expansion of the RIGHT ovarian vein with central low attenuation suggests ovarian venous thrombus. 3. Hepatic lesions 2 of which were definitively present on prior imaging in hepatic subsegment VII and in the lateral segment of the LEFT hepatic lobe, lateral segment LEFT hepatic lobe lesion is compatible with benign hemangioma. There are 2 lesions which may be new and or mast on the prior study by artifact and are indeterminate. Suggest hepatic MRI for further assessment. At the time of hepatic MRI, MRV sequences could be obtained if warranted to assess for RIGHT ovarian venous thrombosis. 4. Background hepatic steatosis. 5. Aortic atherosclerosis, mild.       Interval History: Doing well.  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  Had several episodes of symptomatic hypoglycemia. Her insulin has been changed and she denies any recent episodes.  Her Ozempic was also decreased which has helped her have an appetite and be able to eat without emesis.  She has loose stool related to a side effect of one of her medications.  Denies any recent change to bowel function.  Denies any bladder symptoms.  Denies any vaginal bleeding or discharge.  Denies any abdominal or pelvic pain.  Past Medical/Surgical History: Past Medical History:  Diagnosis Date   Anemia    Asthma 11/01/2010   only  when respiratory infections   BPPV (benign paroxysmal positional vertigo) 03/07/2013   Diabetes mellitus    since 1983   Elevated TSH 05/13/2013   Endometrial cancer ([place])    GERD (gastroesophageal reflux disease)    History  of radiation therapy    Vagina- 06/13/22-07/11/22-Dr. Gery Pray   Hypertension    Joint pain 05/26/2022   Left elbow pain 05/05/2016   Left shoulder pain 05/05/2016   Osteomyelitis ([place])    Pneumonia    as a child   Wrist pain 04/11/2013    Past Surgical History:  Procedure Laterality Date   ABDOMINAL HYSTERECTOMY     ACHILLES TENDON LENGTHENING Left 08/29/2014   Procedure: Left Achilles Lengthening;  Surgeon: Newt Minion, MD;  Location: [facility];  Service: Orthopedics;  Laterality: Left;   AMPUTATION Left 02/19/2013   Procedure: Amputation of Left Great Toe;  Surgeon: Mcarthur Rossetti, MD;  Location: [facility];  Service: Orthopedics;  Laterality: Left;   AMPUTATION Left 08/29/2014   Procedure: Left Foot 2nd and 1st Toe Amputation;  Surgeon: Newt Minion, MD;  Location: [facility];  Service: Orthopedics;  Laterality: Left;   CARDIAC CATHETERIZATION     15 years ago   CESAREAN SECTION     x 4   CYSTOSCOPY  03/30/2022   Procedure: CYSTOSCOPY;  Surgeon: [name], MD;  Location: [facility];  Service: Gynecology;;   IR IMAGING GUIDED PORT INSERTION  04/19/2022   ROBOTIC ASSISTED TOTAL HYSTERECTOMY WITH BILATERAL SALPINGO OOPHERECTOMY Bilateral 03/30/2022   Procedure: XI ROBOTIC ASSISTED TOTAL HYSTERECTOMY WITH BILATERAL SALPINGO-OOPHORECTOMY, MINI LAPAROTOMY;  Surgeon: [name], MD;  Location: [facility];  Service: Gynecology;  Laterality: Bilateral;   SENTINEL NODE BIOPSY N/A 03/30/2022   Procedure: SENTINEL NODE BIOPSY;  Surgeon: [name], MD;  Location: [facility];  Service: Gynecology;  Laterality: N/A;   TUBAL LIGATION     at one of c-sections    Family History  Problem Relation Age of Onset   Hypertension Mother    Dementia Mother    Diabetes Father    Hypertension Father    Diabetes Daughter    Colon cancer Neg Hx    Breast cancer Neg Hx    Ovarian cancer Neg Hx    Endometrial cancer Neg Hx    Pancreatic cancer Neg Hx    Prostate cancer Neg Hx      Social History   Socioeconomic History   Marital status: Divorced    Spouse name: Not on file   Number of children: 3   Years of education: Not on file   Highest education level: Not on file  Occupational History   Not on file  Tobacco Use   Smoking status: Former    Types: Cigarettes    Quit date: 11/28/1962    Years since quitting: 59.8   Smokeless tobacco: Never  Vaping Use   Vaping Use: Never used  Substance and Sexual Activity   Alcohol use: No    Alcohol/week: 0.0 standard drinks of alcohol   Drug use: No   Sexual activity: Not Currently  Other Topics Concern   Not on file  Social History Narrative   Not on file   Social Determinants of Health   Financial Resource Strain: Not on file  Food Insecurity: Not on file  Transportation Needs: Not on file  Physical Activity: Not on file  Stress: Not on file  Social Connections: Not on file    Current Medications:  Current  Outpatient Medications:    Accu-Chek Softclix Lancets lancets, CHECK BLOOD SUGAR 4 TIMES A DAY BEFORE MEALS AND BEDTIME, Disp: 204 each, Rfl: 10   acetaminophen (TYLENOL) 650 MG CR tablet, Take 1,300-1,950 mg by mouth every 8 (eight) hours as needed for pain., Disp: , Rfl:    atorvastatin (LIPITOR) 80 MG tablet, Take 1 tablet (80 mg total) by mouth daily., Disp: 90 tablet, Rfl: 3   Blood Glucose Monitoring Suppl (ACCU-CHEK GUIDE) w/Device KIT, CHECK BLOOD SUGAR 4 TIMES A DAY BEFORE MEALS AND BEDTIME, Disp: 1 kit, Rfl: 1   Blood Pressure Monitoring (BLOOD PRESSURE KIT) DEVI, Use to check blood pressure daily, Disp: 1 each, Rfl: 0   gabapentin (NEURONTIN) 300 MG capsule, TAKE 3 CAPSULES BY MOUTH THREE TIMES DAILY, Disp: 270 capsule, Rfl: 3   glucose blood (ACCU-CHEK GUIDE) test strip, Check blood sugar 4 times per day, Disp: 125 each, Rfl: 12   insulin glargine (LANTUS SOLOSTAR) 100 UNIT/ML Solostar Pen, Inject 30 Units into the skin at bedtime., Disp: 9 mL, Rfl: 2   insulin lispro (HUMALOG) 100  UNIT/ML injection, Inject 0.07 mLs (7 Units total) into the skin 3 (three) times daily with meals., Disp: 10 mL, Rfl: 3   Insulin Syringe-Needle U-100 31G X 15/64" 0.5 ML MISC, 1 Syringe by Does not apply route 3 (three) times daily., Disp: 50 each, Rfl: 3   lisinopril (ZESTRIL) 20 MG tablet, Take 1 tablet (20 mg total) by mouth daily., Disp: 30 tablet, Rfl: 11   meclizine (ANTIVERT) 25 MG tablet, Take 1 tablet (25 mg total) by mouth 2 (two) times daily as needed for dizziness., Disp: 60 tablet, Rfl: 3   metFORMIN (GLUCOPHAGE-XR) 500 MG 24 hr tablet, Take 4 tablets (2,000 mg total) by mouth at bedtime., Disp: 360 tablet, Rfl: 3   Multiple Vitamin (MULTIVITAMIN WITH MINERALS) TABS tablet, Take 1 tablet by mouth every evening. , Disp: , Rfl:    Nutritional Supplements ([place]) LIQD, Take 1 bottle twice daily between meals, Disp: 237 mL, Rfl: 20   omeprazole (PRILOSEC) 20 MG capsule, Take 1 capsule (20 mg total) by mouth daily. (Patient taking differently: Take 20 mg by mouth daily as needed (acid reflux).), Disp: 30 capsule, Rfl: 1   Semaglutide, 1 MG/DOSE, 4 MG/3ML SOPN, Inject 1 mg in the skin once weekly, Disp: 3 mL, Rfl: 2   traZODone (DESYREL) 50 MG tablet, TAKE 1 TABLET BY MOUTH AT BEDTIME AS NEEDED FOR SLEEP, Disp: 30 tablet, Rfl: 2  Review of Systems: Denies appetite changes, fevers, chills, fatigue, unexplained weight changes. Denies hearing loss, neck lumps or masses, mouth sores, ringing in ears or voice changes. Denies cough or wheezing.  Denies shortness of breath. Denies chest pain or palpitations. Denies leg swelling. Denies abdominal distention, pain, blood in stools, constipation, diarrhea, nausea, vomiting, or early satiety. Denies pain with intercourse, dysuria, frequency, hematuria or incontinence. Denies hot flashes, pelvic pain, vaginal bleeding or vaginal discharge.   Denies joint pain, back pain or muscle pain/cramps. Denies itching, rash, or  wounds. Denies dizziness, headaches or seizures. Denies swollen lymph nodes or glands, denies easy bruising or bleeding. Denies anxiety, depression, confusion, or decreased concentration.  Physical Exam: BP (!) 121/56 (BP Location: Left Arm, Patient Position: Sitting)   Pulse 90   Temp 98.6 F (37 C) (Oral)   Resp 17   Ht '6\' 1"'  (1.854 m)   Wt 264 lb 6 oz (119.9 kg)   LMP 06/23/2014   SpO2 100%  BMI 34.88 kg/m  General: Alert, oriented, no acute distress. HEENT: Normocephalic, atraumatic, sclera anicteric. Chest: Clear to auscultation bilaterally.  No wheezes or rhonchi. Cardiovascular: Regular rate and rhythm, no murmurs. Abdomen: Obese, soft, nontender.  Normoactive bowel sounds.  No masses or hepatosplenomegaly appreciated.  Well-healed incisions. Extremities: Grossly normal range of motion.  Warm, well perfused.  No edema bilaterally. Skin: No rashes or lesions noted. Lymphatics: No cervical, supraclavicular, or inguinal adenopathy. GU: Normal appearing external genitalia without erythema, excoriation, or lesions.  Speculum exam reveals mild vaginal atrophy, and very mild changes related to radiation at the vaginal apex.  No lesions noted.  Bimanual exam reveals is intact, no nodularity or masses appreciated.  Rectovaginal exam confirms findings.  Laboratory & Radiologic Studies: CT A/P on 09/13/22: 1. Post hysterectomy. Small amount of fluid in the surgical bed no peripheral enhancement. This area measuring 4.2 x 1.9 cm. This and other findings in the pelvis such as serosal thickening along the sigmoid and fascial thickening along the RIGHT pelvis may reflect postoperative changes. Would suggest close attention on follow-up to exclude the possibility of peritoneal involvement. 2. Mild expansion of the RIGHT ovarian vein with central low attenuation suggests ovarian venous thrombus. 3. Hepatic lesions 2 of which were definitively present on prior imaging in hepatic  subsegment VII and in the lateral segment of the LEFT hepatic lobe, lateral segment LEFT hepatic lobe lesion is compatible with benign hemangioma. There are 2 lesions which may be new and or mast on the prior study by artifact and are indeterminate. Suggest hepatic MRI for further assessment. At the time of hepatic MRI, MRV sequences could be obtained if warranted to assess for RIGHT ovarian venous thrombosis. 4. Background hepatic steatosis. 5. Aortic atherosclerosis, mild.  Assessment & Plan: [name] is a 61 y.o. 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 MMR IHC intact, MS stable, p53 wildtype.  Patient is overall doing very well.  She is NED on exam today.  We discussed recent CT findings.  There are several subtle findings in the pelvis that may be related to postoperative changes.  I favor this versus recurrent/peritoneal disease.  We discussed close interval imaging with a CT scan 3 months after her recent imaging.  I ordered this to be done in January.  Per NCCN surveillance recommendations, we discussed surveillance visits every 3 months alternating between my office and radiation oncology.  Patient will also continue to see Dr. Alvy Bimler given receipt of chemotherapy.  Reviewed signs and symptoms that would be concerning for cancer recurrence, and I stressed the importance of calling if she develops any of these.  Discussed the importance of using vaginal dilator after vaginal brachytherapy.  She was given a new dilator today.  22 minutes of total time was spent for this patient encounter, including preparation, face-to-face counseling with the patient and coordination of care, and documentation of the encounter.  Jeral Pinch, MD  Division of Gynecologic Oncology  Department of Obstetrics and Gynecology  [facility]

## 2022-09-20 NOTE — Patient Instructions (Addendum)
It was good to see you today.  I did not see or feel any evidence of cancer recurrence on your exam.  Based on what we talked about regarding your last CT scan, I ordered a CT scan that we will schedule in January.  I will call you with these results.  We will plan to alternate visits between my office and Dr. Sondra Come every 3 months.  You are scheduled to see him in January.  I will see you back in April.  [place] visits, if you develop any of the symptoms that we discussed today, please call to see me sooner.

## 2022-09-22 ENCOUNTER — Ambulatory Visit (INDEPENDENT_AMBULATORY_CARE_PROVIDER_SITE_OTHER): Payer: Medicaid Other | Admitting: Internal Medicine

## 2022-09-22 ENCOUNTER — Encounter: Payer: Self-pay | Admitting: Internal Medicine

## 2022-09-22 VITALS — BP 135/79 | HR 97 | Ht 73.0 in | Wt 262.0 lb

## 2022-09-22 DIAGNOSIS — E1340 Other specified diabetes mellitus with diabetic neuropathy, unspecified: Secondary | ICD-10-CM

## 2022-09-22 DIAGNOSIS — Z0000 Encounter for general adult medical examination without abnormal findings: Secondary | ICD-10-CM

## 2022-09-22 DIAGNOSIS — E1140 Type 2 diabetes mellitus with diabetic neuropathy, unspecified: Secondary | ICD-10-CM

## 2022-09-22 DIAGNOSIS — E785 Hyperlipidemia, unspecified: Secondary | ICD-10-CM

## 2022-09-22 DIAGNOSIS — Z23 Encounter for immunization: Secondary | ICD-10-CM | POA: Diagnosis not present

## 2022-09-22 DIAGNOSIS — E1169 Type 2 diabetes mellitus with other specified complication: Secondary | ICD-10-CM

## 2022-09-22 DIAGNOSIS — Z794 Long term (current) use of insulin: Secondary | ICD-10-CM | POA: Diagnosis not present

## 2022-09-22 DIAGNOSIS — Z1231 Encounter for screening mammogram for malignant neoplasm of breast: Secondary | ICD-10-CM

## 2022-09-22 DIAGNOSIS — E1142 Type 2 diabetes mellitus with diabetic polyneuropathy: Secondary | ICD-10-CM

## 2022-09-22 LAB — GLUCOSE, CAPILLARY: Glucose-Capillary: 103 mg/dL — ABNORMAL HIGH (ref 70–99)

## 2022-09-22 LAB — POCT GLYCOSYLATED HEMOGLOBIN (HGB A1C): Hemoglobin A1C: 6.1 % — AB (ref 4.0–5.6)

## 2022-09-22 MED ORDER — TRAMADOL HCL 50 MG PO TABS: 50.0000 mg | ORAL_TABLET | Freq: Four times a day (QID) | ORAL | 0 refills | Status: DC | PRN

## 2022-09-22 MED ORDER — PREGABALIN 75 MG PO CAPS: 75.0000 mg | ORAL_CAPSULE | Freq: Two times a day (BID) | ORAL | 11 refills | Status: DC

## 2022-09-22 MED ORDER — DULOXETINE HCL 60 MG PO CPEP: 60.0000 mg | ORAL_CAPSULE | Freq: Every day | ORAL | 3 refills | Status: DC

## 2022-09-22 NOTE — Progress Notes (Signed)
Subjective:  CC: diabetes, neuropathy  HPI:  [name] is a 61 y.o. female with a past medical history stated below and presents today for follow-up on diabetes. Patient with history of well-controlled diabetes over the last several months.  She recently completed chemotherapy and steroid treatment a few weeks ago.  On her visit in August she self titrated up her insulin as she was very concerned that her diabetes was not well controlled.  For discontinuing steroid she did not decrease insulin and was also not monitoring her blood sugars.  She was seen in ED for hypoglycemia to 59.  Televisit earlier this month she received Accu-Chek glucose monitor to monitor blood sugars and insulin was titrated down.  She has been taking Lantus 30 units nightly,Humalog 7 units with meals, and metformin 1000 milligrams twice daily.  Fasting a.m. glucose between 74-127 and mealtime glucose around 100.  Did not bring her glucometer to office visit.  Ozempic was also decreased to 1 mg weekly as hypoglycemic episodes thought to be secondary to decreased appetite.  Since starting chemotherapy she has also had significant neuropathy in her feet and hands.  He had some of this present in her feet secondary to diabetes prior to initiating chemotherapy but this drastically worsened with chemotherapy.  She stopped chemo a few weeks ago and pain has continued.  She takes gabapentin 900 mg 3 times daily with mild relief and pain of the feet.  Please see problem based assessment and plan for additional details.  Past Medical History:  Diagnosis Date   Anemia    Asthma 11/01/2010   only when respiratory infections   BPPV (benign paroxysmal positional vertigo) 03/07/2013   Broken teeth 11/02/2016   Callus of toe 07/07/2022   Colon cancer screening 05/05/2020   Diabetes mellitus    since 1983   Dizziness 09/22/2021   Elevated TSH 05/13/2013   Endometrial cancer ([place])    Gangrene of toe of right foot ([place])  04/14/2021   GERD (gastroesophageal reflux disease)    Hand pain, right 03/14/2019   History of radiation therapy    Vagina- 06/13/22-07/11/22-Dr. Gery Pray   Hypertension    Joint pain 05/26/2022   Left elbow pain 05/05/2016   Left shoulder pain 05/05/2016   Neuropathy in diabetes ([place]) 11/01/2010   Qualifier: Diagnosis of  By: Hassell Done FNP, [place]     Osteomyelitis [facility])    Pneumonia    as a child   Wrist pain 04/11/2013    Current Outpatient Medications on File Prior to Visit  Medication Sig Dispense Refill   Accu-Chek Softclix Lancets lancets CHECK BLOOD SUGAR 4 TIMES A DAY BEFORE MEALS AND BEDTIME 204 each 10   acetaminophen (TYLENOL) 650 MG CR tablet Take 1,300-1,950 mg by mouth every 8 (eight) hours as needed for pain.     atorvastatin (LIPITOR) 80 MG tablet Take 1 tablet (80 mg total) by mouth daily. 90 tablet 3   Blood Glucose Monitoring Suppl (ACCU-CHEK GUIDE) w/Device KIT CHECK BLOOD SUGAR 4 TIMES A DAY BEFORE MEALS AND BEDTIME 1 kit 1   Blood Pressure Monitoring (BLOOD PRESSURE KIT) DEVI Use to check blood pressure daily 1 each 0   glucose blood (ACCU-CHEK GUIDE) test strip Check blood sugar 4 times per day 125 each 12   insulin glargine (LANTUS SOLOSTAR) 100 UNIT/ML Solostar Pen Inject 30 Units into the skin at bedtime. 9 mL 2   insulin lispro (HUMALOG) 100 UNIT/ML injection Inject 0.07 mLs (7 Units  total) into the skin 3 (three) times daily with meals. 10 mL 3   Insulin Syringe-Needle U-100 31G X 15/64" 0.5 ML MISC 1 Syringe by Does not apply route 3 (three) times daily. 50 each 3   lisinopril (ZESTRIL) 20 MG tablet Take 1 tablet (20 mg total) by mouth daily. 30 tablet 11   meclizine (ANTIVERT) 25 MG tablet Take 1 tablet (25 mg total) by mouth 2 (two) times daily as needed for dizziness. 60 tablet 3   metFORMIN (GLUCOPHAGE-XR) 500 MG 24 hr tablet Take 4 tablets (2,000 mg total) by mouth at bedtime. 360 tablet 3   Multiple Vitamin (MULTIVITAMIN WITH MINERALS) TABS tablet  Take 1 tablet by mouth every evening.      Nutritional Supplements ([place]) LIQD Take 1 bottle twice daily between meals 237 mL 20   omeprazole (PRILOSEC) 20 MG capsule Take 1 capsule (20 mg total) by mouth daily. (Patient taking differently: Take 20 mg by mouth daily as needed (acid reflux).) 30 capsule 1   Semaglutide, 1 MG/DOSE, 4 MG/3ML SOPN Inject 1 mg in the skin once weekly 3 mL 2   traZODone (DESYREL) 50 MG tablet TAKE 1 TABLET BY MOUTH AT BEDTIME AS NEEDED FOR SLEEP 30 tablet 2   No current facility-administered medications on file prior to visit.    Family History  Problem Relation Age of Onset   Hypertension Mother    Dementia Mother    Diabetes Father    Hypertension Father    Diabetes Daughter    Colon cancer Neg Hx    Breast cancer Neg Hx    Ovarian cancer Neg Hx    Endometrial cancer Neg Hx    Pancreatic cancer Neg Hx    Prostate cancer Neg Hx     Social History   Socioeconomic History   Marital status: Divorced    Spouse name: Not on file   Number of children: 3   Years of education: Not on file   Highest education level: Not on file  Occupational History   Not on file  Tobacco Use   Smoking status: Former    Types: Cigarettes    Quit date: 11/28/1962    Years since quitting: 59.8   Smokeless tobacco: Never  Vaping Use   Vaping Use: Never used  Substance and Sexual Activity   Alcohol use: No    Alcohol/week: 0.0 standard drinks of alcohol   Drug use: No   Sexual activity: Not Currently  Other Topics Concern   Not on file  Social History Narrative   Not on file   Social Determinants of Health   Financial Resource Strain: Not on file  Food Insecurity: Not on file  Transportation Needs: Not on file  Physical Activity: Not on file  Stress: Not on file  Social Connections: Not on file  Intimate Partner Violence: Not on file    Review of Systems: ROS negative except for what is noted on the assessment and plan.  Objective:    Vitals:   09/22/22 1310  BP: 135/79  Pulse: 97  SpO2: 100%  Weight: 262 lb (118.8 kg)  Height: _0  (1.854 m)    Physical Exam: Constitutional: well-appearing Cardiovascular: regular rate and rhythm, no m/r/g Pulmonary/Chest: normal work of breathing on room air, lungs clear to auscultation bilaterally MSK: no lower extremity edema present Neurological: normal gait Skin: warm and dry   Assessment & Plan:  Controlled type 2 diabetes with neuropathy (HCC) Assessment: Repeat A1c  from 5.7 in July to 6.1.  Patient has been adherent with insulin and self titrated insulin causing hypoglycemic episodes.  I think elevation in A1c is more likely related to steroid dosing that she recently was able to stop once discontinuing chemotherapy. Plan: Continue Lantus 30 units, Humalog 7 units, Ozempic 1 mg weekly and metformin 1000 mg twice daily Repeat A1c due in January  Encounter for screening mammogram for malignant neoplasm of breast Order placed for screening mammogram Patient is due January 2024.  Neuropathy due to secondary diabetes [facility]) Patient is having significant pain in in her hands and feet secondary to neuropathy.  She has had neuropathy for several years and has taken gabapentin.  When she started chemotherapy neuropathy drastically worsened and she has difficulty sleeping secondary to this.  She is continue to take gabapentin 900 mg 3 times daily and noted some relief in the neuropathy in her feet but no relief in her hands.  She wears gloves when going outside if it is cold. A: Neuropathy secondary to chemotherapy can take up to a year to resolve.  Talked with patient about possibly trying duloxetine in addition to gabapentin and she is not interested.  She previously took duloxetine and experienced nausea with this.  We then talked about switching from gabapentin to pregabalin to see if this helps.  She is open to switching, but is very frustrated that this is not a guarantee  as she is having consistent pain.  While taking chemo her oncologist Dr. Alvy Bimler was prescribing tramadol.  She requested a refill of this medication.  We talked about tramadol being a narcotic medication that is not indicated for neuropathy pain.  Talked with her about trying pregabalin and titrating medication as this is more specific for nerve pain. P: Discontinued gabapentin Duloxetine initially sent in however canceled was talking with patient more in depth and she does not want to take this medication again. Pregabalin 75 mg twice daily sent to pharmacy PDMP reviewed and patient received 30 tablets of tramadol last month from oncologist.  I talked with patient about not being able to give this medication long-term but I will send in 20 tablets of tramadol now as she switches from gabapentin to pregabalin.  Long-term I think if pain does not help by pregabalin, would consider further work-up of neuropathy with EMG.  Hyperlipidemia associated with type 2 diabetes mellitus ([place]) She currently has a port in place after finishing chemotherapy.  She was told by oncologist this would need to remain in place until after CT abdomen pelvis completed in several months.  Not able to have blood drawn in clinic at this time. Lab Results  Component Value Date   CHOL 210 (H) 09/22/2021   HDL 59 09/22/2021   LDLCALC 125 (H) 09/22/2021   TRIG 148 09/22/2021   CHOLHDL 3.6 09/22/2021    P: Continue atorvastatin 80 mg and will plan to repeat lipids once port is removed. L DL goal less than 100 for primary prevention  Health care maintenance Flu shot received    Patient discussed with Dr. Ennis Forts Story Vanvranken, D.O. [facility]  PGY-2 Pager: [phone]  Phone: [phone] Date 09/23/2022  Time 8:51 AM

## 2022-09-22 NOTE — Patient Instructions (Addendum)
Thank you, Ms.Ayala A Villamor for allowing [name] to provide your care today.   Neuropathy Please stop taking gabapentin. I am sending in a medication called Lyrica, take this 2 times a day. I have also sent in some tramadol to use when pain is very high. Tramadol is not a great medication to take long term. If your pain continues with adding lyrica please come back in.  Diabetes A1c went from 5.7 to 6.1. Please continue lantus 30 units, Humalog 7 units, and metformin 1000 mg 2 times daily.   We will get additional labs once your port is out. I am so happy that your scans looked good and you are done with chemo!   I have ordered the following labs for you:  Lab Orders         Glucose, capillary         POC Hbg A1C      Referrals ordered today:   Referral Orders  No referral(s) requested today     I have ordered the following medication/changed the following medications:   Stop the following medications: Medications Discontinued During This Encounter  Medication Reason   DULoxetine (CYMBALTA) 60 MG capsule Discontinued by provider   gabapentin (NEURONTIN) 300 MG capsule Change in therapy     Start the following medications: Meds ordered this encounter  Medications   DISCONTD: DULoxetine (CYMBALTA) 60 MG capsule    Sig: Take 1 capsule (60 mg total) by mouth daily.    Dispense:  30 capsule    Refill:  3   pregabalin (LYRICA) 75 MG capsule    Sig: Take 1 capsule (75 mg total) by mouth 2 (two) times daily.    Dispense:  60 capsule    Refill:  11   traMADol (ULTRAM) 50 MG tablet    Sig: Take 1 tablet (50 mg total) by mouth every 6 (six) hours as needed.    Dispense:  20 tablet    Refill:  0     Follow up:  4 weeks    We look forward to seeing you next time. Please call our clinic at [phone] if you have any questions or concerns. The best time to call is Monday-Friday from 9am-4pm, but there is someone available 24/7. If after hours or the weekend, call the main hospital  number and ask for the Internal Medicine Resident On-Call. If you need medication refills, please notify your pharmacy one week in advance and they will send [name] a request.   Thank you for trusting me with your care. Wishing you the best!   Christiana Fuchs, [place]

## 2022-09-23 ENCOUNTER — Encounter: Payer: Self-pay | Admitting: Internal Medicine

## 2022-09-23 NOTE — Assessment & Plan Note (Signed)
Order placed for screening mammogram Patient is due January 2024.

## 2022-09-23 NOTE — Assessment & Plan Note (Signed)
Patient is having significant pain in in her hands and feet secondary to neuropathy.  She has had neuropathy for several years and has taken gabapentin.  When she started chemotherapy neuropathy drastically worsened and she has difficulty sleeping secondary to this.  She is continue to take gabapentin 900 mg 3 times daily and noted some relief in the neuropathy in her feet but no relief in her hands.  She wears gloves when going outside if it is cold. A: Neuropathy secondary to chemotherapy can take up to a year to resolve.  Talked with patient about possibly trying duloxetine in addition to gabapentin and she is not interested.  She previously took duloxetine and experienced nausea with this.  We then talked about switching from gabapentin to pregabalin to see if this helps.  She is open to switching, but is very frustrated that this is not a guarantee as she is having consistent pain.  While taking chemo her oncologist Dr. Alvy Bimler was prescribing tramadol.  She requested a refill of this medication.  We talked about tramadol being a narcotic medication that is not indicated for neuropathy pain.  Talked with her about trying pregabalin and titrating medication as this is more specific for nerve pain. P: Discontinued gabapentin Duloxetine initially sent in however canceled was talking with patient more in depth and she does not want to take this medication again. Pregabalin 75 mg twice daily sent to pharmacy PDMP reviewed and patient received 30 tablets of tramadol last month from oncologist.  I talked with patient about not being able to give this medication long-term but I will send in 20 tablets of tramadol now as she switches from gabapentin to pregabalin.  Long-term I think if pain does not help by pregabalin, would consider further work-up of neuropathy with EMG.

## 2022-09-23 NOTE — Assessment & Plan Note (Signed)
Flu shot received

## 2022-09-23 NOTE — Assessment & Plan Note (Signed)
Assessment: Repeat A1c from 5.7 in July to 6.1.  Patient has been adherent with insulin and self titrated insulin causing hypoglycemic episodes.  I think elevation in A1c is more likely related to steroid dosing that she recently was able to stop once discontinuing chemotherapy. Plan: Continue Lantus 30 units, Humalog 7 units, Ozempic 1 mg weekly and metformin 1000 mg twice daily Repeat A1c due in January

## 2022-09-23 NOTE — Assessment & Plan Note (Addendum)
She currently has a port in place after finishing chemotherapy.  She was told by oncologist this would need to remain in place until after CT abdomen pelvis completed in several months.  Not able to have blood drawn in clinic at this time. Lab Results  Component Value Date   CHOL 210 (H) 09/22/2021   HDL 59 09/22/2021   LDLCALC 125 (H) 09/22/2021   TRIG 148 09/22/2021   CHOLHDL 3.6 09/22/2021    P: Continue atorvastatin 80 mg and will plan to repeat lipids once port is removed. L DL goal less than 100 for primary prevention

## 2022-09-23 NOTE — Progress Notes (Signed)
Internal Medicine Clinic Attending  Case discussed with Dr. Masters  At the time of the visit.  We reviewed the resident's history and exam and pertinent patient test results.  I agree with the assessment, diagnosis, and plan of care documented in the resident's note.  

## 2022-09-26 ENCOUNTER — Telehealth: Payer: Self-pay

## 2022-09-26 NOTE — Telephone Encounter (Signed)
Prior Authorization for patient(Dexcom G6 Sensor) came through on cover my meds was submitted with last office notes and labs awaiting approval or denial

## 2022-09-27 NOTE — Telephone Encounter (Signed)
Decision:Denied [facility] Key: [id number] - [place] Case ID: [id number] Need help? Call us at [phone] Outcome Deniedon October 30 [place] Case: [id number], Status: Denied. Notification: Completed. Drug Dexcom G6 Sensor Form [employer] [facility] [place] Form [id number] NCPDP)

## 2022-09-29 NOTE — Telephone Encounter (Signed)
ERROR

## 2022-10-03 ENCOUNTER — Other Ambulatory Visit: Payer: Self-pay | Admitting: *Deleted

## 2022-10-03 DIAGNOSIS — E1340 Other specified diabetes mellitus with diabetic neuropathy, unspecified: Secondary | ICD-10-CM

## 2022-10-03 MED ORDER — PREGABALIN 75 MG PO CAPS: 75.0000 mg | ORAL_CAPSULE | Freq: Two times a day (BID) | ORAL | 3 refills | Status: AC

## 2022-10-03 NOTE — Telephone Encounter (Signed)
Received call from [facility] at [facility] stating max refills for Lyrica is 5. She cannot take a VO for this. Requesting Rx be resent with 5 refills only.

## 2022-10-11 ENCOUNTER — Other Ambulatory Visit: Payer: Self-pay

## 2022-10-11 DIAGNOSIS — E1142 Type 2 diabetes mellitus with diabetic polyneuropathy: Secondary | ICD-10-CM

## 2022-10-11 DIAGNOSIS — E1340 Other specified diabetes mellitus with diabetic neuropathy, unspecified: Secondary | ICD-10-CM

## 2022-10-11 DIAGNOSIS — G4700 Insomnia, unspecified: Secondary | ICD-10-CM

## 2022-10-11 DIAGNOSIS — Z794 Long term (current) use of insulin: Secondary | ICD-10-CM

## 2022-10-11 MED ORDER — TRAZODONE HCL 50 MG PO TABS: 50.0000 mg | ORAL_TABLET | Freq: Every evening | ORAL | 2 refills | Status: DC | PRN

## 2022-10-11 MED ORDER — METFORMIN HCL ER 500 MG PO TB24: 2000.0000 mg | ORAL_TABLET | Freq: Every day | ORAL | 3 refills | Status: DC

## 2022-10-11 NOTE — Telephone Encounter (Signed)
Refill for metformin and trazodone sent to pharmacy.

## 2022-10-24 ENCOUNTER — Encounter: Payer: Medicaid Other | Admitting: Student

## 2022-10-24 ENCOUNTER — Encounter: Payer: Self-pay | Admitting: Family Medicine

## 2022-10-27 ENCOUNTER — Other Ambulatory Visit: Payer: Self-pay

## 2022-10-27 ENCOUNTER — Telehealth: Payer: Self-pay

## 2022-10-27 ENCOUNTER — Inpatient Hospital Stay: Payer: Medicaid Other | Attending: Gynecologic Oncology

## 2022-10-27 ENCOUNTER — Other Ambulatory Visit: Payer: Self-pay | Admitting: Hematology and Oncology

## 2022-10-27 ENCOUNTER — Encounter: Payer: Self-pay | Admitting: Hematology and Oncology

## 2022-10-27 ENCOUNTER — Inpatient Hospital Stay: Payer: Medicaid Other | Admitting: Hematology and Oncology

## 2022-10-27 VITALS — BP 101/61 | HR 94 | Temp 97.8°F | Resp 18 | Ht 73.0 in | Wt 251.8 lb

## 2022-10-27 DIAGNOSIS — Z8542 Personal history of malignant neoplasm of other parts of uterus: Secondary | ICD-10-CM | POA: Insufficient documentation

## 2022-10-27 DIAGNOSIS — C541 Malignant neoplasm of endometrium: Secondary | ICD-10-CM

## 2022-10-27 DIAGNOSIS — E1140 Type 2 diabetes mellitus with diabetic neuropathy, unspecified: Secondary | ICD-10-CM | POA: Diagnosis not present

## 2022-10-27 DIAGNOSIS — D61818 Other pancytopenia: Secondary | ICD-10-CM

## 2022-10-27 DIAGNOSIS — E1340 Other specified diabetes mellitus with diabetic neuropathy, unspecified: Secondary | ICD-10-CM

## 2022-10-27 DIAGNOSIS — E538 Deficiency of other specified B group vitamins: Secondary | ICD-10-CM | POA: Insufficient documentation

## 2022-10-27 LAB — CBC WITH DIFFERENTIAL/PLATELET
Abs Immature Granulocytes: 0.01 10*3/uL (ref 0.00–0.07)
Basophils Absolute: 0 10*3/uL (ref 0.0–0.1)
Basophils Relative: 1 %
Eosinophils Absolute: 0.1 10*3/uL (ref 0.0–0.5)
Eosinophils Relative: 1 %
HCT: 30.4 % — ABNORMAL LOW (ref 36.0–46.0)
Hemoglobin: 9.9 g/dL — ABNORMAL LOW (ref 12.0–15.0)
Immature Granulocytes: 0 %
Lymphocytes Relative: 37 %
Lymphs Abs: 1.6 10*3/uL (ref 0.7–4.0)
MCH: 35.5 pg — ABNORMAL HIGH (ref 26.0–34.0)
MCHC: 32.6 g/dL (ref 30.0–36.0)
MCV: 109 fL — ABNORMAL HIGH (ref 80.0–100.0)
Monocytes Absolute: 0.3 10*3/uL (ref 0.1–1.0)
Monocytes Relative: 8 %
Neutro Abs: 2.3 10*3/uL (ref 1.7–7.7)
Neutrophils Relative %: 53 %
Platelets: 181 10*3/uL (ref 150–400)
RBC: 2.79 MIL/uL — ABNORMAL LOW (ref 3.87–5.11)
RDW: 12.6 % (ref 11.5–15.5)
WBC: 4.3 10*3/uL (ref 4.0–10.5)
nRBC: 0 % (ref 0.0–0.2)

## 2022-10-27 LAB — COMPREHENSIVE METABOLIC PANEL
ALT: 11 U/L (ref 0–44)
AST: 14 U/L — ABNORMAL LOW (ref 15–41)
Albumin: 4.5 g/dL (ref 3.5–5.0)
Alkaline Phosphatase: 83 U/L (ref 38–126)
Anion gap: 10 (ref 5–15)
BUN: 23 mg/dL — ABNORMAL HIGH (ref 6–20)
CO2: 26 mmol/L (ref 22–32)
Calcium: 9.7 mg/dL (ref 8.9–10.3)
Chloride: 103 mmol/L (ref 98–111)
Creatinine, Ser: 0.75 mg/dL (ref 0.44–1.00)
GFR, Estimated: >60 mL/min (ref >60)
Glucose, Bld: 141 mg/dL — ABNORMAL HIGH (ref 70–99)
Potassium: 4.3 mmol/L (ref 3.5–5.1)
Sodium: 139 mmol/L (ref 135–145)
Total Bilirubin: 0.4 mg/dL (ref 0.3–1.2)
Total Protein: 7.8 g/dL (ref 6.5–8.1)

## 2022-10-27 LAB — IRON AND IRON BINDING CAPACITY (CC-WL,HP ONLY)
Iron: 51 ug/dL (ref 28–170)
Saturation Ratios: 15 % (ref 10.4–31.8)
TIBC: 330 ug/dL (ref 250–450)
UIBC: 279 ug/dL (ref 148–442)

## 2022-10-27 LAB — VITAMIN B12: Vitamin B-12: 165 pg/mL — ABNORMAL LOW (ref 180–914)

## 2022-10-27 LAB — FERRITIN: Ferritin: 66 ng/mL (ref 11–307)

## 2022-10-27 MED ORDER — HEPARIN SOD (PORK) LOCK FLUSH 100 UNIT/ML IV SOLN
500.0000 [IU] | Freq: Once | INTRAVENOUS | Status: AC
  Administered 2022-10-27: 500 [IU]

## 2022-10-27 MED ORDER — CYANOCOBALAMIN 1000 MCG/ML IJ SOLN: 1000.0000 ug | INTRAMUSCULAR | 11 refills | Status: DC

## 2022-10-27 MED ORDER — SODIUM CHLORIDE 0.9% FLUSH
10.0000 mL | Freq: Once | INTRAVENOUS | Status: AC
  Administered 2022-10-27: 10 mL

## 2022-10-27 NOTE — Assessment & Plan Note (Addendum)
This is resolving I have ordered vitamin B12 level and iron studies and we will call her with test results I anticipate improvement time away from chemotherapy

## 2022-10-27 NOTE — Telephone Encounter (Signed)
Called and told her b12 is low. Per Dr. Alvy Bimler, she can give herself injections at home or take oral B12. She would like to do injections at home and ask that the Rx be sent to [facility]. Sent Dr. Alvy Bimler a message.

## 2022-10-27 NOTE — Progress Notes (Signed)
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[facility]) She has no new symptoms to suggest cancer recurrence Due to slight abnormal description of her liver and other places on her prior CT imaging, I recommend repeat CT imaging in January   Pancytopenia, acquired [facility]) This is resolving I have ordered vitamin B12 level and iron studies and we will call her with test results I anticipate improvement time away from chemotherapy  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[place]) Neuropathy is improving She will continue aggressive management of her diabetes  Orders Placed This Encounter  Procedures   CT ABDOMEN PELVIS W CONTRAST    Standing Status:   Future    Standing Expiration Date:   10/28/2023    Order Specific Question:   If indicated for the ordered procedure, I authorize the administration of contrast media per Radiology protocol    Answer:   Yes    Order Specific Question:   Preferred imaging location?    Answer:   [facility]    Order Specific Question:   Radiology Contrast Protocol - do NOT remove file path    Answer:   [email or web address]    Order Specific Question:   Is patient pregnant?    Answer:   No    All questions were answered. The patient knows to call the clinic with any problems, questions or concerns. The total time spent in the appointment was 20 minutes encounter with patients including review of chart and various tests results, discussions about plan of care and coordination of care plan   Heath Lark, MD 10/27/2022 12:21 PM  INTERVAL HISTORY: Please see below for problem oriented charting. she returns for surveillance follow-up Since last time I saw her, she is getting better She has lost weight and her insulin requirement is reduced Her energy level has improved She  denies abdominal pain or changes in her bowel habits  REVIEW OF SYSTEMS:   Constitutional: Denies fevers, chills or abnormal weight loss Eyes: Denies blurriness of vision Ears, nose, mouth, throat, and face: Denies mucositis or sore throat Respiratory: Denies cough, dyspnea or wheezes Cardiovascular: Denies palpitation, chest discomfort or lower extremity swelling Gastrointestinal:  Denies nausea, heartburn or change in bowel habits Skin: Denies abnormal skin rashes Lymphatics: Denies new lymphadenopathy or easy bruising Neurological:Denies numbness, tingling or new weaknesses Behavioral/Psych: Mood is stable, no new changes  All other systems were reviewed with the patient and are negative.  I have reviewed the past medical history, past surgical history, social history and family history with the patient and they are unchanged from previous note.  ALLERGIES:  is allergic to nsaids and penicillins.  MEDICATIONS:  Current Outpatient Medications  Medication Sig Dispense Refill   Accu-Chek Softclix Lancets lancets CHECK BLOOD SUGAR 4 TIMES A DAY BEFORE MEALS AND BEDTIME 204 each 10   acetaminophen (TYLENOL) 650 MG CR tablet Take 1,300-1,950 mg by mouth every 8 (eight) hours as needed for pain.     atorvastatin (LIPITOR) 80 MG tablet Take 1 tablet (80 mg total) by mouth daily. 90 tablet 3   Blood Glucose Monitoring Suppl (ACCU-CHEK GUIDE) w/Device KIT CHECK BLOOD SUGAR 4 TIMES A DAY BEFORE MEALS AND BEDTIME 1 kit 1   Blood Pressure Monitoring (BLOOD PRESSURE KIT) DEVI Use to check blood pressure daily 1 each 0   glucose blood (ACCU-CHEK GUIDE) test strip Check blood sugar 4  times per day 125 each 12   insulin glargine (LANTUS SOLOSTAR) 100 UNIT/ML Solostar Pen Inject 30 Units into the skin at bedtime. 9 mL 2   insulin lispro (HUMALOG) 100 UNIT/ML injection Inject 0.07 mLs (7 Units total) into the skin 3 (three) times daily with meals. 10 mL 3   Insulin Syringe-Needle U-100 31G X 15/64" 0.5  ML MISC 1 Syringe by Does not apply route 3 (three) times daily. 50 each 3   lisinopril (ZESTRIL) 20 MG tablet Take 1 tablet (20 mg total) by mouth daily. 30 tablet 11   meclizine (ANTIVERT) 25 MG tablet Take 1 tablet (25 mg total) by mouth 2 (two) times daily as needed for dizziness. 60 tablet 3   metFORMIN (GLUCOPHAGE-XR) 500 MG 24 hr tablet Take 4 tablets (2,000 mg total) by mouth at bedtime. 360 tablet 3   Multiple Vitamin (MULTIVITAMIN WITH MINERALS) TABS tablet Take 1 tablet by mouth every evening.      Nutritional Supplements ([place]) LIQD Take 1 bottle twice daily between meals 237 mL 20   omeprazole (PRILOSEC) 20 MG capsule Take 1 capsule (20 mg total) by mouth daily. (Patient taking differently: Take 20 mg by mouth daily as needed (acid reflux).) 30 capsule 1   pregabalin (LYRICA) 75 MG capsule Take 1 capsule (75 mg total) by mouth 2 (two) times daily. 60 capsule 3   Semaglutide, 1 MG/DOSE, 4 MG/3ML SOPN Inject 1 mg in the skin once weekly 3 mL 2   traZODone (DESYREL) 50 MG tablet Take 1 tablet (50 mg total) by mouth at bedtime as needed. for sleep 30 tablet 2   No current facility-administered medications for this visit.    SUMMARY OF ONCOLOGIC HISTORY: Oncology History Overview Note  P53 wild type   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([place])  02/01/2022 Initial Diagnosis   She presented with postmenopausal bleeding   02/07/2022 Imaging   US pelvis Thickened heterogeneous endometrium with masslike region with internal vascularity. Findings may be associated with endometrial carcinoma or hyperplasia. Biopsy is recommended for further evaluation.   02/22/2022 Initial Biopsy   EMB: MMMT/carcinosarcoma (predominance of sarcomatous component)   03/05/2022 Imaging   Ct chest, abdomen and pelvis 1. Heterogeneous enlargement of the endometrial cavity is compatible with the reported history of uterine carcinosarcoma. No definite involvement of the posterior bladder wall or  sigmoid colon/rectum. 2. 9 mm short axis right external iliac node is upper normal for size. Attention on follow-up recommended. Otherwise no lymphadenopathy in the abdomen or pelvis. 3. 3.6 x 2.7 cm lesion in the lateral segment left liver has subtle peripheral nodular enhancement. This is almost certainly a benign cavernous hemangioma, but MRI abdomen with and without contrast recommended to confirm. 4. Aortic Atherosclerosis (ICD10-I70.0).   03/08/2022 Initial Diagnosis   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([place])   03/30/2022 Surgery   TRH/BSO, SLN biopsy on right, left pelvic LND, LOS, mini-lap  Findings: On EUA, 12cm minimally mobile uterus. On intra-abdominal exam, normal upper abdominal survey. Omentum adherent to the anterior abdominal wall along the prior midline incision. Normal omentum otherwise, normal small and large bowel. 12 cm uterus densely adherent to the anterior abdominal wall, obliterating some of the anterior anatomy including the anterior cul de sac. Normal appearing bilateral adnexa. Mapping successful to right obturator SLN, mildly enlarged. Dye seen within the parametrium on the left, no SLNs identified. No obvious adenopathy on the left. Decision made given length of surgery, comorbitidies to defer left para-aortic lymphadenectomy. Mini-lap required for specimen  delivery. Dome intact on cystoscopy and good efflux noted from bilateral ureteral orifices.   03/30/2022 Pathology Results   MMMT/carcinosarcoma, 6.3 cm MI 1cm or 3 (<50%) Cervical stroma, bilateral tubes/ovaries benign R SLN and L pelvic LNDs negative  ONCOLOGY TABLE:   UTERUS, CARCINOMA OR CARCINOSARCOMA: Resection   Procedure: Total hysterectomy and bilateral salpingo-oophorectomy  Histologic Type:  Malignant mixed Mullerian tumor (MMMT/ carcinosarcoma)  Histologic Grade: High-grade  Myometrial Invasion:       Depth of Myometrial Invasion (mm): 10 mm       Myometrial Thickness (mm): 30 mm       Percentage of  Myometrial Invasion: 33%  Uterine Serosa Involvement: Not identified  Cervical stromal Involvement: Not identified  Extent of involvement of other tissue/organs: Not identified  Peritoneal/Ascitic Fluid: Not applicable  Lymphovascular Invasion: Not identified  Regional Lymph Nodes:       Pelvic Lymph Nodes Examined:                                   1 Sentinel                                   6 Non-sentinel                                   7 Total       Pelvic Lymph Nodes with Metastasis: 0                           Macrometastasis: (>2.0 mm): 0                           Micrometastasis: (>0.2 mm and < 2.0 mm): 0                           Isolated Tumor Cells (<0.2 mm): 0                           Laterality of Lymph Node with Tumor: Not  applicable                           Extracapsular Extension: Not applicable       Para-aortic Lymph Nodes Examined:                                    0 Sentinel                                    0 non-sentinel                                    0 total  Distant Metastasis:       Distant Site(s) Involved: Not applicable  Pathologic Stage Classification (pTNM, AJCC 8th Edition): pT1a, pN0  Ancillary Studies: MMR / MSI testing will be ordered  Representative Tumor Block: B1  Comment(s): Pancytokeratin  was performed on the lymph nodes and is  negative.    04/08/2022 Cancer Staging   Staging form: Corpus Uteri - Carcinoma and Carcinosarcoma, AJCC 8th Edition - Pathologic stage from 04/08/2022: FIGO Stage IA (pT1a, pN0, cM0) - Signed by Heath Lark, MD on 04/08/2022 Stage prefix: Initial diagnosis   04/21/2022 Procedure   Placement of single lumen port a cath via right internal jugular vein. The catheter tip lies at the cavo-atrial junction. A power injectable port a cath was placed and is ready for immediate use.       04/29/2022 - 08/18/2022 Chemotherapy   Patient is on Treatment Plan : UTERINE Carboplatin AUC 6 / Paclitaxel q21d     06/13/2022 -  07/11/2022 Radiation Therapy   06/13/2022 through 07/11/2022 Site Technique Total Dose (Gy) Dose per Fx (Gy) Completed Fx Beam Energies  Vagina: Pelvis HDR-brachy 30/30 6 5/5 Ir-192    09/14/2022 Imaging   Narrative & Impression   EXAM: CT ABDOMEN AND PELVIS WITH CONTRAST   TECHNIQUE: Multidetector CT imaging of the abdomen and pelvis was performed using the standard protocol following bolus administration of intravenous contrast.   RADIATION DOSE REDUCTION: This exam was performed according to the departmental dose-optimization program which includes automated exposure control, adjustment of the mA and/or kV according to patient size and/or use of iterative reconstruction technique.   CONTRAST:  19m OMNIPAQUE IOHEXOL 300 MG/ML  SOLN   COMPARISON:  March 04, 2022   FINDINGS: Lower chest: Basilar atelectasis. No effusion or consolidative changes.   Hepatobiliary: LEFT hepatic lobe lesion at 3.4 cm is unchanged since April of 2023 with peripheral and nodular enhancement and centripetal filling noted on previous imaging compatible with large hepatic hemangioma.   Small hypodensity in the posterior RIGHT hemiliver not definitely seen on previous imaging (image 19/2) 8 mm, perhaps present on the prior study but masked by artifact from the patient's arm.   Low-density focus in the posterior RIGHT hemiliver, hepatic subsegment VII (image 16/2) 11 mm, this is stable. There is background hepatic steatosis.   Subtle area of low attenuation in the RIGHT hemiliver (image 31/2) 9 mm. Portal vein is patent. No pericholecystic stranding or signs of biliary duct distension.   Pancreas: Normal, without mass, inflammation or ductal dilatation.   Spleen: Normal.   Adrenals/Urinary Tract:   Adrenal glands are unremarkable. Symmetric renal enhancement. No sign of hydronephrosis. No suspicious renal lesion or perinephric stranding.   Urinary bladder is grossly unremarkable.   Stomach/Bowel: No  acute gastrointestinal findings. Appendix is normal.   Vascular/Lymphatic:   Aortic atherosclerosis. No sign of aneurysm. Smooth contour of the IVC. There is no gastrohepatic or hepatoduodenal ligament lymphadenopathy. No retroperitoneal or mesenteric lymphadenopathy.   No pelvic sidewall lymphadenopathy.   Mild expansion of the RIGHT ovarian vein with central low attenuation that suggests thrombus within ligated ovarian vein. Delayed images without signs of propagation into the IVC.   Reproductive: Post hysterectomy. Small amount of fluid in the surgical bed (image 81/2) no peripheral enhancement. This area measuring 4.2 x 1.9 cm. Scattered areas of fluid density which are smaller, other areas less than a cm. Mild fascial thickening along the RIGHT pelvic sidewall. (Image 75/2) mild serosal thickening of the sigmoid colon is suggested. No peritoneal nodularity outside of the pelvis.   Other: As above   Musculoskeletal: No acute bone finding. No destructive bone process. Spinal degenerative changes.   IMPRESSION: 1. Post hysterectomy. Small amount of fluid in the surgical bed no peripheral enhancement.  This area measuring 4.2 x 1.9 cm. This and other findings in the pelvis such as serosal thickening along the sigmoid and fascial thickening along the RIGHT pelvis may reflect postoperative changes. Would suggest close attention on follow-up to exclude the possibility of peritoneal involvement. 2. Mild expansion of the RIGHT ovarian vein with central low attenuation suggests ovarian venous thrombus. 3. Hepatic lesions 2 of which were definitively present on prior imaging in hepatic subsegment VII and in the lateral segment of the LEFT hepatic lobe, lateral segment LEFT hepatic lobe lesion is compatible with benign hemangioma. There are 2 lesions which may be new and or mast on the prior study by artifact and are indeterminate. Suggest hepatic MRI for further assessment. At the time of hepatic MRI,  MRV sequences could be obtained if warranted to assess for RIGHT ovarian venous thrombosis. 4. Background hepatic steatosis. 5. Aortic atherosclerosis, mild.       PHYSICAL EXAMINATION: ECOG PERFORMANCE STATUS: 0 - Asymptomatic  Vitals:   10/27/22 1209  BP: 101/61  Pulse: 94  Resp: 18  Temp: 97.8 F (36.6 C)  SpO2: 100%   Filed Weights   10/27/22 1209  Weight: 251 lb 12.8 oz (114.2 kg)    GENERAL:alert, no distress and comfortable NEURO: alert & oriented x 3 with fluent speech, no focal motor/sensory deficits  LABORATORY DATA:  I have reviewed the data as listed    Component Value Date/Time   NA 139 10/27/2022 1136   NA 139 09/22/2021 0954   K 4.3 10/27/2022 1136   CL 103 10/27/2022 1136   CO2 26 10/27/2022 1136   GLUCOSE 141 (H) 10/27/2022 1136   BUN 23 (H) 10/27/2022 1136   BUN 13 09/22/2021 0954   CREATININE 0.75 10/27/2022 1136   CREATININE 0.73 09/13/2022 0812   CREATININE 0.67 04/23/2015 1645   CALCIUM 9.7 10/27/2022 1136   PROT 7.8 10/27/2022 1136   PROT 7.3 09/22/2021 0954   ALBUMIN 4.5 10/27/2022 1136   ALBUMIN 4.4 09/22/2021 0954   AST 14 (L) 10/27/2022 1136   AST 13 (L) 09/13/2022 0812   ALT 11 10/27/2022 1136   ALT 11 09/13/2022 0812   ALKPHOS 83 10/27/2022 1136   BILITOT 0.4 10/27/2022 1136   BILITOT 0.3 09/13/2022 0812   GFRNONAA >60 10/27/2022 1136   GFRNONAA >60 09/13/2022 0812   GFRNONAA >89 01/06/2014 1643   GFRAA >60 02/25/2020 0334   GFRAA >89 01/06/2014 1643    No results found for: "SPEP", "UPEP"  Lab Results  Component Value Date   WBC 4.3 10/27/2022   NEUTROABS 2.3 10/27/2022   HGB 9.9 (L) 10/27/2022   HCT 30.4 (L) 10/27/2022   MCV 109.0 (H) 10/27/2022   PLT 181 10/27/2022      Chemistry      Component Value Date/Time   NA 139 10/27/2022 1136   NA 139 09/22/2021 0954   K 4.3 10/27/2022 1136   CL 103 10/27/2022 1136   CO2 26 10/27/2022 1136   BUN 23 (H) 10/27/2022 1136   BUN 13 09/22/2021 0954   CREATININE 0.75  10/27/2022 1136   CREATININE 0.73 09/13/2022 0812   CREATININE 0.67 04/23/2015 1645      Component Value Date/Time   CALCIUM 9.7 10/27/2022 1136   ALKPHOS 83 10/27/2022 1136   AST 14 (L) 10/27/2022 1136   AST 13 (L) 09/13/2022 0812   ALT 11 10/27/2022 1136   ALT 11 09/13/2022 0812   BILITOT 0.4 10/27/2022 1136   BILITOT 0.3 09/13/2022 0071

## 2022-10-27 NOTE — Assessment & Plan Note (Signed)
She has no new symptoms to suggest cancer recurrence Due to slight abnormal description of her liver and other places on her prior CT imaging, I recommend repeat CT imaging in January

## 2022-10-27 NOTE — Assessment & Plan Note (Signed)
Neuropathy is improving She will continue aggressive management of her diabetes

## 2022-10-28 ENCOUNTER — Ambulatory Visit (INDEPENDENT_AMBULATORY_CARE_PROVIDER_SITE_OTHER): Payer: Medicaid Other | Admitting: Podiatry

## 2022-10-28 DIAGNOSIS — Z91199 Patient's noncompliance with other medical treatment and regimen due to unspecified reason: Secondary | ICD-10-CM

## 2022-10-28 NOTE — Progress Notes (Signed)
1. No-show for appointment   Chemotherapy on yesterday.

## 2022-11-08 ENCOUNTER — Telehealth: Payer: Self-pay

## 2022-11-08 NOTE — Telephone Encounter (Signed)
Called Pt with below message and reminded Pt of upcoming appts. Included radiology scheduling number. Pt verbalized understanding and was appreciative of call.

## 2022-11-08 NOTE — Telephone Encounter (Signed)
-----   Message from Flo Shanks, RN sent at 11/08/2022 11:52 AM EST -----  ----- Message ----- From: Heath Lark, MD Sent: 11/08/2022  11:49 AM EST To: Flo Shanks, RN  Pls remind her to call for CT to be done on 1/17

## 2022-12-05 NOTE — Progress Notes (Incomplete)
[name] is here today for follow up post radiation to the pelvic.   They completed their radiation on: 07/11/2022    Does the patient complain of any of the following:  Pain:*** Abdominal bloating: *** Diarrhea/Constipation: *** Nausea/Vomiting: *** Vaginal Discharge: *** Blood in Urine or Stool: *** Urinary Issues (dysuria/incomplete emptying/ incontinence/ increased frequency/urgency): *** Does patient report using vaginal dilator 2-3 times a week and/or sexually active 2-3 weeks: *** Post radiation skin changes: ***   Additional comments if applicable:

## 2022-12-08 ENCOUNTER — Other Ambulatory Visit: Payer: Self-pay | Admitting: Hematology and Oncology

## 2022-12-08 ENCOUNTER — Other Ambulatory Visit: Payer: Self-pay

## 2022-12-08 DIAGNOSIS — G4700 Insomnia, unspecified: Secondary | ICD-10-CM

## 2022-12-08 DIAGNOSIS — C541 Malignant neoplasm of endometrium: Secondary | ICD-10-CM

## 2022-12-11 NOTE — Progress Notes (Incomplete)
Radiation Oncology         [phone] ________________________________  Name: [name] MRN: [id number]  Date: 12/12/2022  DOB: 1961-04-27  Follow-Up Visit Note  CC: Angelique Blonder, Vara Guardian, MD    ICD-10-CM   1. 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([place])  C54.1       Diagnosis: The encounter diagnosis was 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([place]).   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 She was last seen here for follow-up on 08/11/22. Since her last visit, the patient completed chemotherapy consisting of carboplatin and paclitaxel on 08/18/22 under the care of Dr. Alvy Bimler. The patient had issues with worsening neuropathy through out the course of systemic treatment, prompting taxol to be omitted for her final cycle of treatment.   On 09/03/22, the patient presented to the ED for evaluation of hypoglycemia secondary to misplacing her glucose monitor with continued use of insulin. Her blood sugar returned to baseline after being administered additional glucose.   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 2 of the visualized hepatic lesions were previously seen and compatible with benign  etiologies. There were several other indeterminate lesions which may have been new.   During her most recent follow-up visit with Dr. Berline Lopes on 09/20/22, the patient was noted to be doing well and NED on exam. She did endorse some residual neuropathy in her hands and feet as well as several more episodes of symptomatic hypoglycemia (now well-managed). She was also given a new vaginal dilator.   Given possible liver abnormalities noted on her CT from October, the patient will have a follow-up CT performed on 12/14/22.   ***  Allergies:  is allergic to nsaids and penicillins.  Meds: Current Outpatient Medications  Medication Sig Dispense Refill   Accu-Chek Softclix Lancets lancets CHECK BLOOD SUGAR 4 TIMES A DAY BEFORE MEALS AND BEDTIME 204 each 10   acetaminophen (TYLENOL) 650 MG CR tablet Take 1,300-1,950 mg by mouth every 8 (eight) hours as needed for pain.     atorvastatin (LIPITOR) 80 MG tablet Take 1 tablet (80 mg total) by mouth daily. 90 tablet 3   Blood Glucose Monitoring Suppl (ACCU-CHEK GUIDE) w/Device KIT CHECK BLOOD SUGAR 4 TIMES A DAY BEFORE MEALS AND BEDTIME 1 kit 1   Blood Pressure Monitoring (BLOOD PRESSURE KIT) DEVI Use to check blood pressure daily 1 each 0   cyanocobalamin (VITAMIN B12) 1000 MCG/ML injection Inject 1 mL (1,000 mcg total) into the muscle every 30 (thirty) days. 1 mL 11   glucose blood (ACCU-CHEK GUIDE) test strip Check blood sugar 4 times per day 125 each 12   insulin glargine (LANTUS SOLOSTAR) 100 UNIT/ML Solostar Pen Inject 30 Units into the skin at bedtime. 9 mL 2   insulin lispro (HUMALOG)  100 UNIT/ML injection Inject 0.07 mLs (7 Units total) into the skin 3 (three) times daily with meals. 10 mL 3   Insulin Syringe-Needle U-100 31G X 15/64" 0.5 ML MISC 1 Syringe by Does not apply route 3 (three) times daily. 50 each 3   lisinopril (ZESTRIL) 20 MG tablet Take 1 tablet (20 mg total) by mouth daily. 30 tablet 11   meclizine (ANTIVERT) 25 MG tablet Take 1  tablet (25 mg total) by mouth 2 (two) times daily as needed for dizziness. 60 tablet 3   metFORMIN (GLUCOPHAGE-XR) 500 MG 24 hr tablet Take 4 tablets (2,000 mg total) by mouth at bedtime. 360 tablet 3   Multiple Vitamin (MULTIVITAMIN WITH MINERALS) TABS tablet Take 1 tablet by mouth every evening.      Nutritional Supplements ([place]) LIQD Take 1 bottle twice daily between meals 237 mL 20   omeprazole (PRILOSEC) 20 MG capsule Take 1 capsule (20 mg total) by mouth daily. (Patient taking differently: Take 20 mg by mouth daily as needed (acid reflux).) 30 capsule 1   pregabalin (LYRICA) 75 MG capsule Take 1 capsule (75 mg total) by mouth 2 (two) times daily. 60 capsule 3   Semaglutide, 1 MG/DOSE, 4 MG/3ML SOPN Inject 1 mg in the skin once weekly 3 mL 2   traZODone (DESYREL) 50 MG tablet Take 1 tablet (50 mg total) by mouth at bedtime as needed. for sleep 30 tablet 2   No current facility-administered medications for this encounter.    Physical Findings: The patient is in no acute distress. Patient is alert and oriented.  vitals were not taken for this visit. .  No significant changes. Lungs are clear to auscultation bilaterally. Heart has regular rate and rhythm. No palpable cervical, supraclavicular, or axillary adenopathy. Abdomen soft, non-tender, normal bowel sounds.  On pelvic examination the external genitalia were unremarkable. A speculum exam was performed. There are no mucosal lesions noted in the vaginal vault. A Pap smear was obtained of the proximal vagina. On bimanual and rectovaginal examination there were no pelvic masses appreciated. ***    Lab Findings: Lab Results  Component Value Date   WBC 4.3 10/27/2022   HGB 9.9 (L) 10/27/2022   HCT 30.4 (L) 10/27/2022   MCV 109.0 (H) 10/27/2022   PLT 181 10/27/2022    Radiographic Findings: No results found.  Impression: The encounter diagnosis was 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([place]).   Carcinosarcoma of the  endometrium, Stage II-C by 2023 FIGO staging  The patient is recovering from the effects of radiation.  ***  Plan:  ***   *** minutes of total time was spent for this patient encounter, including preparation, face-to-face counseling with the patient and coordination of care, physical exam, and documentation of the encounter. ____________________________________  Blair Promise, PhD, MD  This document serves as a record of services personally performed by Gery Pray, MD. It was created on his behalf by Roney Mans, a trained medical scribe. The creation of this record is based on the scribe's personal observations and the provider's statements to them. This document has been checked and approved by the attending provider.

## 2022-12-12 ENCOUNTER — Telehealth: Payer: Self-pay | Admitting: *Deleted

## 2022-12-12 ENCOUNTER — Ambulatory Visit
Admission: RE | Admit: 2022-12-12 | Discharge: 2022-12-12 | Disposition: A | Discharge status: Home / Self Care | Payer: Medicaid Other | Source: Ambulatory Visit | Attending: Radiation Oncology | Admitting: Radiation Oncology

## 2022-12-12 DIAGNOSIS — C541 Malignant neoplasm of endometrium: Secondary | ICD-10-CM

## 2022-12-12 NOTE — Telephone Encounter (Signed)
CALLED PATIENT TO ASK ABOUT WHEN SHE WOULD BE READY TO RESCHEDULE, PATIENT AGREED TO COME ON 12-29-22 @ 11:15 AM

## 2022-12-12 NOTE — Progress Notes (Signed)
Patient states that she forgot about the appointment that she had scheduled with Dr. Sondra Come this morning and states that she would like to reschedule. Rn will notify [name] that she needs to reschedule.

## 2022-12-13 NOTE — Progress Notes (Incomplete)
CC: ***  HPI:   [name] is a 62 y.o. female with a past medical history of asthma, diabetes, endometrial cancer, GERD, and hypertension who presents for routine follow-up. She was last seen at [facility] in 08-2022.   Controlled type 2 diabetes with neuropathy (HCC) Assessment: Repeat A1c from 5.7 in July to 6.1.  Patient has been adherent with insulin and self titrated insulin causing hypoglycemic episodes.  I think elevation in A1c is more likely related to steroid dosing that she recently was able to stop once discontinuing chemotherapy. Plan: Continue Lantus 30 units, Humalog 7 units, Ozempic 1 mg weekly and metformin 1000 mg twice daily Repeat A1c due in January  Xxx    Encounter for screening mammogram for malignant neoplasm of breast Order placed for screening mammogram Patient is due January 2024.  Xxx Has not completed screening    Neuropathy due to secondary diabetes [facility]) Patient is having significant pain in in her hands and feet secondary to neuropathy.  She has had neuropathy for several years and has taken gabapentin.  When she started chemotherapy neuropathy drastically worsened and she has difficulty sleeping secondary to this.  She is continue to take gabapentin 900 mg 3 times daily and noted some relief in the neuropathy in her feet but no relief in her hands.  She wears gloves when going outside if it is cold. A: Neuropathy secondary to chemotherapy can take up to a year to resolve.  Talked with patient about possibly trying duloxetine in addition to gabapentin and she is not interested.  She previously took duloxetine and experienced nausea with this.  We then talked about switching from gabapentin to pregabalin to see if this helps.  She is open to switching, but is very frustrated that this is not a guarantee as she is having consistent pain.  While taking chemo her oncologist Dr. Alvy Rivera was prescribing tramadol.  She requested a refill of this medication.   We talked about tramadol being a narcotic medication that is not indicated for neuropathy pain.  Talked with her about trying pregabalin and titrating medication as this is more specific for nerve pain. P: Discontinued gabapentin Duloxetine initially sent in however canceled was talking with patient more in depth and she does not want to take this medication again. Pregabalin 75 mg twice daily sent to pharmacy PDMP reviewed and patient received 30 tablets of tramadol last month from oncologist.  I talked with patient about not being able to give this medication long-term but I will send in 20 tablets of tramadol now as she switches from gabapentin to pregabalin. Long-term I think if pain does not help by pregabalin, would consider further work-up of neuropathy with EMG.  Amitrityline ??? 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[place]) Neuropathy is improving She will continue aggressive management of her diabetes  Xxx    Endometrial cancer She currently has a port in place after finishing chemotherapy.  She was told by oncologist this would need to remain in place until after CT abdomen pelvis completed in several months.  Not able to have blood drawn in clinic at this time. 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[facility]) She has no new symptoms to suggest cancer recurrence Due to slight abnormal description of her liver and other places on her prior CT imaging, I recommend repeat CT imaging in January  Xxx Follow up visits with oncology 1-19 and then 2-1 radiation oncology     Pancytopenia, acquired [facility]) This is resolving I have ordered vitamin  B12 level and iron studies and we will call her with test results - 165 (L) on 10-27-2022 I anticipate improvement time away from chemotherapy  Xxxx On cyanocobalamin q168    Meclizine and trazodone ???    Past Medical History:  Diagnosis Date   Anemia    Asthma 11/01/2010   only when respiratory infections   BPPV (benign paroxysmal positional  vertigo) 03/07/2013   Broken teeth 11/02/2016   Callus of toe 07/07/2022   Colon cancer screening 05/05/2020   Diabetes mellitus    since 1983   Dizziness 09/22/2021   Elevated TSH 05/13/2013   Endometrial cancer ([place])    Gangrene of toe of right foot ([place]) 04/14/2021   GERD (gastroesophageal reflux disease)    Hand pain, right 03/14/2019   History of radiation therapy    Vagina- 06/13/22-07/11/22-Dr. Gery Pray   Hypertension    Joint pain 05/26/2022   Left elbow pain 05/05/2016   Left shoulder pain 05/05/2016   Neuropathy in diabetes ([place]) 11/01/2010   Qualifier: Diagnosis of  By: Hassell Done FNP, [place]     Osteomyelitis [facility])    Pneumonia    as a child   Wrist pain 04/11/2013     Review of Systems:    Reports *** Denies *** (subjective fever?, pain anywhere?, bowel changes?)   Physical Exam:  There were no vitals filed for this visit.  General:   awake and alert, sitting comfortably in chair, cooperative, not in acute distress Skin:   warm and dry, intact without any obvious lesions or scars, no rashes or lesions  Head:   normocephalic and atraumatic, oral mucosa moist with good dentition, no lymphadenopathy Eyes:   extraocular movements intact, conjunctivae pink, pupils round and reactive to light, no periorbital swelling or scleral icterus Ears:   pinnae normal, no discharge or external lesions  Nose:   symmetrical and without mucosal inflammation, no external lesions or discharge Lungs:   normal respiratory effort, breathing unlabored, symmetrical chest rise, no crackles or wheezing Cardiac:   regular rate and rhythm, normal S1 and S2, capillary refill 2-3 seconds, dorsalis pedis pulses intact bilaterally, no pitting edema Abdomen:   soft and non-distended, normoactive bowel sounds present in all four quadrants, no guarding or palpable masses Musculoskeletal:   full range of motion in joints, motor strength 5 /5 in all four extremities, no obvious deformities or joint  tenderness Neurologic:   oriented to person-place-time, moving all extremities, sensation to light touch intact, no facial droop Psychiatric:   euthymic mood with congruent affect, intelligible speech    Assessment & Plan:   No problem-specific Assessment & Plan notes found for this encounter.     See Encounters Tab for problem based charting.  Patient {GC/GE:[id number]::"discussed with","seen with"} Dr. {NAMES:[id number]::"Guilloud","Hoffman","Mullen","Narendra","Williams","Vincent"}

## 2022-12-14 ENCOUNTER — Inpatient Hospital Stay: Payer: Medicaid Other | Attending: Gynecologic Oncology

## 2022-12-14 ENCOUNTER — Ambulatory Visit (HOSPITAL_COMMUNITY)
Admission: RE | Admit: 2022-12-14 | Discharge: 2022-12-14 | Disposition: A | Discharge status: Home / Self Care | Payer: Medicaid Other | Source: Ambulatory Visit | Attending: Hematology and Oncology | Admitting: Hematology and Oncology

## 2022-12-14 ENCOUNTER — Other Ambulatory Visit: Payer: Self-pay

## 2022-12-14 DIAGNOSIS — C541 Malignant neoplasm of endometrium: Secondary | ICD-10-CM | POA: Insufficient documentation

## 2022-12-14 DIAGNOSIS — K760 Fatty (change of) liver, not elsewhere classified: Secondary | ICD-10-CM | POA: Insufficient documentation

## 2022-12-14 LAB — CBC WITH DIFFERENTIAL/PLATELET
Abs Immature Granulocytes: 0.01 10*3/uL (ref 0.00–0.07)
Basophils Absolute: 0 10*3/uL (ref 0.0–0.1)
Basophils Relative: 0 %
Eosinophils Absolute: 0 10*3/uL (ref 0.0–0.5)
Eosinophils Relative: 1 %
HCT: 30.2 % — ABNORMAL LOW (ref 36.0–46.0)
Hemoglobin: 10 g/dL — ABNORMAL LOW (ref 12.0–15.0)
Immature Granulocytes: 0 %
Lymphocytes Relative: 35 %
Lymphs Abs: 1.5 10*3/uL (ref 0.7–4.0)
MCH: 34.6 pg — ABNORMAL HIGH (ref 26.0–34.0)
MCHC: 33.1 g/dL (ref 30.0–36.0)
MCV: 104.5 fL — ABNORMAL HIGH (ref 80.0–100.0)
Monocytes Absolute: 0.2 10*3/uL (ref 0.1–1.0)
Monocytes Relative: 5 %
Neutro Abs: 2.5 10*3/uL (ref 1.7–7.7)
Neutrophils Relative %: 59 %
Platelets: 205 10*3/uL (ref 150–400)
RBC: 2.89 MIL/uL — ABNORMAL LOW (ref 3.87–5.11)
RDW: 13.2 % (ref 11.5–15.5)
WBC: 4.2 10*3/uL (ref 4.0–10.5)
nRBC: 0 % (ref 0.0–0.2)

## 2022-12-14 LAB — COMPREHENSIVE METABOLIC PANEL
ALT: 9 U/L (ref 0–44)
AST: 12 U/L — ABNORMAL LOW (ref 15–41)
Albumin: 3.9 g/dL (ref 3.5–5.0)
Alkaline Phosphatase: 75 U/L (ref 38–126)
Anion gap: 7 (ref 5–15)
BUN: 13 mg/dL (ref 6–20)
CO2: 27 mmol/L (ref 22–32)
Calcium: 9.4 mg/dL (ref 8.9–10.3)
Chloride: 109 mmol/L (ref 98–111)
Creatinine, Ser: 0.64 mg/dL (ref 0.44–1.00)
GFR, Estimated: >60 mL/min (ref >60)
Glucose, Bld: 73 mg/dL (ref 70–99)
Potassium: 4.4 mmol/L (ref 3.5–5.1)
Sodium: 143 mmol/L (ref 135–145)
Total Bilirubin: 0.3 mg/dL (ref 0.3–1.2)
Total Protein: 6.6 g/dL (ref 6.5–8.1)

## 2022-12-14 MED ORDER — IOHEXOL 300 MG/ML  SOLN
100.0000 mL | Freq: Once | INTRAMUSCULAR | Status: AC | PRN
  Administered 2022-12-14: 100 mL via INTRAVENOUS

## 2022-12-14 MED ORDER — SODIUM CHLORIDE 0.9% FLUSH
10.0000 mL | Freq: Once | INTRAVENOUS | Status: AC
  Administered 2022-12-14: 10 mL

## 2022-12-14 MED ORDER — HEPARIN SOD (PORK) LOCK FLUSH 100 UNIT/ML IV SOLN
INTRAVENOUS | Status: AC
  Filled 2022-12-14: qty 5

## 2022-12-14 MED ORDER — HEPARIN SOD (PORK) LOCK FLUSH 100 UNIT/ML IV SOLN
500.0000 [IU] | Freq: Once | INTRAVENOUS | Status: AC
  Administered 2022-12-14: 500 [IU] via INTRAVENOUS

## 2022-12-15 ENCOUNTER — Encounter: Payer: Medicaid Other | Admitting: Student

## 2022-12-16 ENCOUNTER — Other Ambulatory Visit: Payer: Self-pay

## 2022-12-16 ENCOUNTER — Encounter: Payer: Self-pay | Admitting: Hematology and Oncology

## 2022-12-16 ENCOUNTER — Inpatient Hospital Stay (HOSPITAL_BASED_OUTPATIENT_CLINIC_OR_DEPARTMENT_OTHER): Payer: Medicaid Other | Admitting: Hematology and Oncology

## 2022-12-16 VITALS — BP 116/64 | HR 90 | Resp 18 | Ht 73.0 in | Wt 252.0 lb

## 2022-12-16 DIAGNOSIS — K760 Fatty (change of) liver, not elsewhere classified: Secondary | ICD-10-CM | POA: Diagnosis not present

## 2022-12-16 DIAGNOSIS — C541 Malignant neoplasm of endometrium: Secondary | ICD-10-CM | POA: Diagnosis not present

## 2022-12-16 NOTE — Progress Notes (Signed)
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[facility]) I have reviewed CT imaging with the patient The fluid at the surgical bed has improved The abnormality seen in the liver is likely benign I plan to see her again in 6 months for further follow-up I will space out her future imaging study She has appointment pending to see radiation oncologist and GYN surgeon  Hepatic steatosis The area of concern is likely benign, likely benign hemangioma and fatty liver infiltration We discussed importance of risk factor modification She is doing well and is using less insulin compared to her prior visit  No orders of the defined types were placed in this encounter.   All questions were answered. The patient knows to call the clinic with any problems, questions or concerns. The total time spent in the appointment was 20 minutes encounter with patients including review of chart and various tests results, discussions about plan of care and coordination of care plan   Heath Lark, MD 12/16/2022 1:41 PM  INTERVAL HISTORY: Please see below for problem oriented charting. she returns for review of test results She is doing well She is able to reduce her insulin requirement Denies abdominal pain, bloating or changes in bowel habits  REVIEW OF SYSTEMS:   Constitutional: Denies fevers, chills or abnormal weight loss Eyes: Denies blurriness of vision Ears, nose, mouth, throat, and face: Denies mucositis or sore throat Respiratory: Denies cough, dyspnea or wheezes Cardiovascular: Denies palpitation, chest discomfort or lower extremity swelling Gastrointestinal:  Denies nausea, heartburn or change in bowel habits Skin: Denies abnormal skin rashes Lymphatics: Denies new lymphadenopathy or easy bruising Neurological:Denies numbness, tingling or new weaknesses Behavioral/Psych: Mood is stable, no  new changes  All other systems were reviewed with the patient and are negative.  I have reviewed the past medical history, past surgical history, social history and family history with the patient and they are unchanged from previous note.  ALLERGIES:  is allergic to nsaids and penicillins.  MEDICATIONS:  Current Outpatient Medications  Medication Sig Dispense Refill   Accu-Chek Softclix Lancets lancets CHECK BLOOD SUGAR 4 TIMES A DAY BEFORE MEALS AND BEDTIME 204 each 10   acetaminophen (TYLENOL) 650 MG CR tablet Take 1,300-1,950 mg by mouth every 8 (eight) hours as needed for pain.     atorvastatin (LIPITOR) 80 MG tablet Take 1 tablet (80 mg total) by mouth daily. 90 tablet 3   Blood Glucose Monitoring Suppl (ACCU-CHEK GUIDE) w/Device KIT CHECK BLOOD SUGAR 4 TIMES A DAY BEFORE MEALS AND BEDTIME 1 kit 1   Blood Pressure Monitoring (BLOOD PRESSURE KIT) DEVI Use to check blood pressure daily 1 each 0   cyanocobalamin (VITAMIN B12) 1000 MCG/ML injection Inject 1 mL (1,000 mcg total) into the muscle every 30 (thirty) days. 1 mL 11   glucose blood (ACCU-CHEK GUIDE) test strip Check blood sugar 4 times per day 125 each 12   insulin glargine (LANTUS SOLOSTAR) 100 UNIT/ML Solostar Pen Inject 30 Units into the skin at bedtime. 9 mL 2   insulin lispro (HUMALOG) 100 UNIT/ML injection Inject 0.07 mLs (7 Units total) into the skin 3 (three) times daily with meals. 10 mL 3   Insulin Syringe-Needle U-100 31G X 15/64" 0.5 ML MISC 1 Syringe by Does not apply route 3 (three) times daily. 50 each 3   lisinopril (ZESTRIL) 20 MG tablet Take 1 tablet (20 mg total) by  mouth daily. 30 tablet 11   meclizine (ANTIVERT) 25 MG tablet Take 1 tablet (25 mg total) by mouth 2 (two) times daily as needed for dizziness. 60 tablet 3   metFORMIN (GLUCOPHAGE-XR) 500 MG 24 hr tablet Take 4 tablets (2,000 mg total) by mouth at bedtime. 360 tablet 3   Multiple Vitamin (MULTIVITAMIN WITH MINERALS) TABS tablet Take 1 tablet by mouth  every evening.      Nutritional Supplements ([place]) LIQD Take 1 bottle twice daily between meals 237 mL 20   omeprazole (PRILOSEC) 20 MG capsule Take 1 capsule (20 mg total) by mouth daily. (Patient taking differently: Take 20 mg by mouth daily as needed (acid reflux).) 30 capsule 1   pregabalin (LYRICA) 75 MG capsule Take 1 capsule (75 mg total) by mouth 2 (two) times daily. 60 capsule 3   Semaglutide, 1 MG/DOSE, 4 MG/3ML SOPN Inject 1 mg in the skin once weekly 3 mL 2   traZODone (DESYREL) 50 MG tablet Take 1 tablet (50 mg total) by mouth at bedtime as needed. for sleep 30 tablet 2   No current facility-administered medications for this visit.    SUMMARY OF ONCOLOGIC HISTORY: Oncology History Overview Note  P53 wild type   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([place])  02/01/2022 Initial Diagnosis   She presented with postmenopausal bleeding   02/07/2022 Imaging   US pelvis Thickened heterogeneous endometrium with masslike region with internal vascularity. Findings may be associated with endometrial carcinoma or hyperplasia. Biopsy is recommended for further evaluation.   02/22/2022 Initial Biopsy   EMB: MMMT/carcinosarcoma (predominance of sarcomatous component)   03/05/2022 Imaging   Ct chest, abdomen and pelvis 1. Heterogeneous enlargement of the endometrial cavity is compatible with the reported history of uterine carcinosarcoma. No definite involvement of the posterior bladder wall or sigmoid colon/rectum. 2. 9 mm short axis right external iliac node is upper normal for size. Attention on follow-up recommended. Otherwise no lymphadenopathy in the abdomen or pelvis. 3. 3.6 x 2.7 cm lesion in the lateral segment left liver has subtle peripheral nodular enhancement. This is almost certainly a benign cavernous hemangioma, but MRI abdomen with and without contrast recommended to confirm. 4. Aortic Atherosclerosis (ICD10-I70.0).   03/08/2022 Initial Diagnosis   Carcinosarcoma of  endometrium ([place])   03/30/2022 Surgery   TRH/BSO, SLN biopsy on right, left pelvic LND, LOS, mini-lap  Findings: On EUA, 12cm minimally mobile uterus. On intra-abdominal exam, normal upper abdominal survey. Omentum adherent to the anterior abdominal wall along the prior midline incision. Normal omentum otherwise, normal small and large bowel. 12 cm uterus densely adherent to the anterior abdominal wall, obliterating some of the anterior anatomy including the anterior cul de sac. Normal appearing bilateral adnexa. Mapping successful to right obturator SLN, mildly enlarged. Dye seen within the parametrium on the left, no SLNs identified. No obvious adenopathy on the left. Decision made given length of surgery, comorbitidies to defer left para-aortic lymphadenectomy. Mini-lap required for specimen delivery. Dome intact on cystoscopy and good efflux noted from bilateral ureteral orifices.   03/30/2022 Pathology Results   MMMT/carcinosarcoma, 6.3 cm MI 1cm or 3 (<50%) Cervical stroma, bilateral tubes/ovaries benign R SLN and L pelvic LNDs negative  ONCOLOGY TABLE:   UTERUS, CARCINOMA OR CARCINOSARCOMA: Resection   Procedure: Total hysterectomy and bilateral salpingo-oophorectomy  Histologic Type:  Malignant mixed Mullerian tumor (MMMT/ carcinosarcoma)  Histologic Grade: High-grade  Myometrial Invasion:       Depth of Myometrial Invasion (mm): 10 mm  Myometrial Thickness (mm): 30 mm       Percentage of Myometrial Invasion: 33%  Uterine Serosa Involvement: Not identified  Cervical stromal Involvement: Not identified  Extent of involvement of other tissue/organs: Not identified  Peritoneal/Ascitic Fluid: Not applicable  Lymphovascular Invasion: Not identified  Regional Lymph Nodes:       Pelvic Lymph Nodes Examined:                                   1 Sentinel                                   6 Non-sentinel                                   7 Total       Pelvic Lymph Nodes with  Metastasis: 0                           Macrometastasis: (>2.0 mm): 0                           Micrometastasis: (>0.2 mm and < 2.0 mm): 0                           Isolated Tumor Cells (<0.2 mm): 0                           Laterality of Lymph Node with Tumor: Not  applicable                           Extracapsular Extension: Not applicable       Para-aortic Lymph Nodes Examined:                                    0 Sentinel                                    0 non-sentinel                                    0 total  Distant Metastasis:       Distant Site(s) Involved: Not applicable  Pathologic Stage Classification (pTNM, AJCC 8th Edition): pT1a, pN0  Ancillary Studies: MMR / MSI testing will be ordered  Representative Tumor Block: B1  Comment(s): Pancytokeratin was performed on the lymph nodes and is  negative.    04/08/2022 Cancer Staging   Staging form: Corpus Uteri - Carcinoma and Carcinosarcoma, AJCC 8th Edition - Pathologic stage from 04/08/2022: FIGO Stage IA (pT1a, pN0, cM0) - Signed by Heath Lark, MD on 04/08/2022 Stage prefix: Initial diagnosis   04/21/2022 Procedure   Placement of single lumen port a cath via right internal jugular vein. The catheter tip lies at the cavo-atrial junction. A power injectable port a cath was placed and is ready for immediate use.  04/29/2022 - 08/18/2022 Chemotherapy   Patient is on Treatment Plan : UTERINE Carboplatin AUC 6 / Paclitaxel q21d     06/13/2022 - 07/11/2022 Radiation Therapy   06/13/2022 through 07/11/2022 Site Technique Total Dose (Gy) Dose per Fx (Gy) Completed Fx Beam Energies  Vagina: Pelvis HDR-brachy 30/30 6 5/5 Ir-192    09/14/2022 Imaging   Narrative & Impression   EXAM: CT ABDOMEN AND PELVIS WITH CONTRAST   TECHNIQUE: Multidetector CT imaging of the abdomen and pelvis was performed using the standard protocol following bolus administration of intravenous contrast.   RADIATION DOSE REDUCTION: This exam was  performed according to the departmental dose-optimization program which includes automated exposure control, adjustment of the mA and/or kV according to patient size and/or use of iterative reconstruction technique.   CONTRAST:  191m OMNIPAQUE IOHEXOL 300 MG/ML  SOLN   COMPARISON:  March 04, 2022   FINDINGS: Lower chest: Basilar atelectasis. No effusion or consolidative changes.   Hepatobiliary: LEFT hepatic lobe lesion at 3.4 cm is unchanged since April of 2023 with peripheral and nodular enhancement and centripetal filling noted on previous imaging compatible with large hepatic hemangioma.   Small hypodensity in the posterior RIGHT hemiliver not definitely seen on previous imaging (image 19/2) 8 mm, perhaps present on the prior study but masked by artifact from the patient's arm.   Low-density focus in the posterior RIGHT hemiliver, hepatic subsegment VII (image 16/2) 11 mm, this is stable. There is background hepatic steatosis.   Subtle area of low attenuation in the RIGHT hemiliver (image 31/2) 9 mm. Portal vein is patent. No pericholecystic stranding or signs of biliary duct distension.   Pancreas: Normal, without mass, inflammation or ductal dilatation.   Spleen: Normal.   Adrenals/Urinary Tract:   Adrenal glands are unremarkable. Symmetric renal enhancement. No sign of hydronephrosis. No suspicious renal lesion or perinephric stranding.   Urinary bladder is grossly unremarkable.   Stomach/Bowel: No acute gastrointestinal findings. Appendix is normal.   Vascular/Lymphatic:   Aortic atherosclerosis. No sign of aneurysm. Smooth contour of the IVC. There is no gastrohepatic or hepatoduodenal ligament lymphadenopathy. No retroperitoneal or mesenteric lymphadenopathy.   No pelvic sidewall lymphadenopathy.   Mild expansion of the RIGHT ovarian vein with central low attenuation that suggests thrombus within ligated ovarian vein. Delayed images without signs of propagation into the  IVC.   Reproductive: Post hysterectomy. Small amount of fluid in the surgical bed (image 81/2) no peripheral enhancement. This area measuring 4.2 x 1.9 cm. Scattered areas of fluid density which are smaller, other areas less than a cm. Mild fascial thickening along the RIGHT pelvic sidewall. (Image 75/2) mild serosal thickening of the sigmoid colon is suggested. No peritoneal nodularity outside of the pelvis.   Other: As above   Musculoskeletal: No acute bone finding. No destructive bone process. Spinal degenerative changes.   IMPRESSION: 1. Post hysterectomy. Small amount of fluid in the surgical bed no peripheral enhancement. This area measuring 4.2 x 1.9 cm. This and other findings in the pelvis such as serosal thickening along the sigmoid and fascial thickening along the RIGHT pelvis may reflect postoperative changes. Would suggest close attention on follow-up to exclude the possibility of peritoneal involvement. 2. Mild expansion of the RIGHT ovarian vein with central low attenuation suggests ovarian venous thrombus. 3. Hepatic lesions 2 of which were definitively present on prior imaging in hepatic subsegment VII and in the lateral segment of the LEFT hepatic lobe, lateral segment LEFT hepatic lobe lesion is compatible with benign hemangioma.  There are 2 lesions which may be new and or mast on the prior study by artifact and are indeterminate. Suggest hepatic MRI for further assessment. At the time of hepatic MRI, MRV sequences could be obtained if warranted to assess for RIGHT ovarian venous thrombosis. 4. Background hepatic steatosis. 5. Aortic atherosclerosis, mild.     12/14/2022 Imaging   CT scan  Again surgical changes from previous hysterectomy. The previous fluid in the dependent pelvis is improved with some residual stranding. Slight soft tissue thickening along the fascial planes of the pelvis but no discrete mass lesion or lymph node enlargement.   Stable in the right gonadal,  ovarian vein. Small amount of thrombus is possible and stable.   Fatty liver infiltration with a segment 2 hemangioma. Faint low-attenuation small foci in segment 7 of the liver on the prior today 1 is stable in the other is not as well seen. Recommend continued surveillance   Aortic atherosclerosis      PHYSICAL EXAMINATION: ECOG PERFORMANCE STATUS: 0 - Asymptomatic  Vitals:   12/16/22 1123  BP: 116/64  Pulse: 90  Resp: 18  SpO2: 100%   Filed Weights   12/16/22 1123  Weight: 252 lb (114.3 kg)    GENERAL:alert, no distress and comfortable NEURO: alert & oriented x 3 with fluent speech, no focal motor/sensory deficits  LABORATORY DATA:  I have reviewed the data as listed    Component Value Date/Time   NA 143 12/14/2022 1117   NA 139 09/22/2021 0954   K 4.4 12/14/2022 1117   CL 109 12/14/2022 1117   CO2 27 12/14/2022 1117   GLUCOSE 73 12/14/2022 1117   BUN 13 12/14/2022 1117   BUN 13 09/22/2021 0954   CREATININE 0.64 12/14/2022 1117   CREATININE 0.73 09/13/2022 0812   CREATININE 0.67 04/23/2015 1645   CALCIUM 9.4 12/14/2022 1117   PROT 6.6 12/14/2022 1117   PROT 7.3 09/22/2021 0954   ALBUMIN 3.9 12/14/2022 1117   ALBUMIN 4.4 09/22/2021 0954   AST 12 (L) 12/14/2022 1117   AST 13 (L) 09/13/2022 0812   ALT 9 12/14/2022 1117   ALT 11 09/13/2022 0812   ALKPHOS 75 12/14/2022 1117   BILITOT 0.3 12/14/2022 1117   BILITOT 0.3 09/13/2022 0812   GFRNONAA >60 12/14/2022 1117   GFRNONAA >60 09/13/2022 0812   GFRNONAA >89 01/06/2014 1643   GFRAA >60 02/25/2020 0334   GFRAA >89 01/06/2014 1643    No results found for: "SPEP", "UPEP"  Lab Results  Component Value Date   WBC 4.2 12/14/2022   NEUTROABS 2.5 12/14/2022   HGB 10.0 (L) 12/14/2022   HCT 30.2 (L) 12/14/2022   MCV 104.5 (H) 12/14/2022   PLT 205 12/14/2022      Chemistry      Component Value Date/Time   NA 143 12/14/2022 1117   NA 139 09/22/2021 0954   K 4.4 12/14/2022 1117   CL 109 12/14/2022 1117    CO2 27 12/14/2022 1117   BUN 13 12/14/2022 1117   BUN 13 09/22/2021 0954   CREATININE 0.64 12/14/2022 1117   CREATININE 0.73 09/13/2022 0812   CREATININE 0.67 04/23/2015 1645      Component Value Date/Time   CALCIUM 9.4 12/14/2022 1117   ALKPHOS 75 12/14/2022 1117   AST 12 (L) 12/14/2022 1117   AST 13 (L) 09/13/2022 0812   ALT 9 12/14/2022 1117   ALT 11 09/13/2022 0812   BILITOT 0.3 12/14/2022 1117   BILITOT 0.3 09/13/2022 2130  RADIOGRAPHIC STUDIES: I have reviewed imaging study with the patient I have personally reviewed the radiological images as listed and agreed with the findings in the report. CT ABDOMEN PELVIS W CONTRAST  Result Date: 12/15/2022 CLINICAL DATA:  Cervical cancer. Assess treatment response * Tracking Code: BO * EXAM: CT ABDOMEN AND PELVIS WITH CONTRAST TECHNIQUE: Multidetector CT imaging of the abdomen and pelvis was performed using the standard protocol following bolus administration of intravenous contrast. RADIATION DOSE REDUCTION: This exam was performed according to the departmental dose-optimization program which includes automated exposure control, adjustment of the mA and/or kV according to patient size and/or use of iterative reconstruction technique. CONTRAST:  170m OMNIPAQUE IOHEXOL 300 MG/ML  SOLN COMPARISON:  09/13/2022 FINDINGS: Lower chest: Slight linear opacity lung bases likely scar or atelectasis. No pleural effusion. Hepatobiliary: Mild fatty liver infiltration. There is a focal lesion identified showing progressive peripheral nodular enhancement in segment 2, unchanged from previous and consistent with a hemangioma measuring 3.6 x 2.7 cm. Gallbladder is contracted. Patent portal vein. There is a small calcification along the caudate lobe of liver, unchanged from previous and possibly sequela of previous infectious or inflammatory process. Tiny low-attenuation area in segment 7 is stable measuring 8 mm on series 2, image 15. Nonspecific the  second area described previously is not well seen on the current examination. Pancreas: Preserved pancreatic parenchyma. Spleen: Spleen is nonenlarged.  Preserved enhancement. Adrenals/Urinary Tract: Adrenal glands are preserved. No enhancing renal mass or collecting system dilatation. The uterus has a normal course and caliber down to the bladder. Preserved contours of the urinary bladder. Stomach/Bowel: On this non oral contrast exam, the large bowel has a normal course and caliber. Scattered colonic stool. Normal appendix in the right lower quadrant inferior to the cecum. The stomach is nondilated. Small bowel is nondilated. Vascular/Lymphatic: Normal caliber aorta and IVC. Mild vascular calcifications along the aorta. Once again there is some thrombus noted along an ectatic right gonadal vein, unchanged from previous. No specific abnormal lymph node enlargement identified in the abdomen and pelvis. Previously there was a 9 mm short axis right external iliac chain lymph node which is again suggested but again not pathologic. Reproductive: The uterus is not seen. On the prior there is a small amount of free fluid in the pelvis which is improved. Some residual stranding. Stable fascial thickening along the adnexa. Nonspecific. No focal soft tissue mass. Other: Mild skin thickening along the anterior abdominal wall. Slight subcutaneous fat stranding, nonspecific. Musculoskeletal: Scattered degenerative changes of the spine and pelvis. IMPRESSION: Again surgical changes from previous hysterectomy. The previous fluid in the dependent pelvis is improved with some residual stranding. Slight soft tissue thickening along the fascial planes of the pelvis but no discrete mass lesion or lymph node enlargement. Stable in the right gonadal, ovarian vein. Small amount of thrombus is possible and stable. Fatty liver infiltration with a segment 2 hemangioma. Faint low-attenuation small foci in segment 7 of the liver on the prior  today 1 is stable in the other is not as well seen. Recommend continued surveillance Aortic atherosclerosis ICD10 cone-I 70 filling defect Electronically Signed   By: AJill SideM.D.   On: 12/15/2022 10:27

## 2022-12-16 NOTE — Assessment & Plan Note (Signed)
The area of concern is likely benign, likely benign hemangioma and fatty liver infiltration We discussed importance of risk factor modification She is doing well and is using less insulin compared to her prior visit

## 2022-12-16 NOTE — Assessment & Plan Note (Signed)
I have reviewed CT imaging with the patient The fluid at the surgical bed has improved The abnormality seen in the liver is likely benign I plan to see her again in 6 months for further follow-up I will space out her future imaging study She has appointment pending to see radiation oncologist and GYN surgeon

## 2022-12-23 ENCOUNTER — Ambulatory Visit (INDEPENDENT_AMBULATORY_CARE_PROVIDER_SITE_OTHER): Payer: Medicaid Other | Admitting: Internal Medicine

## 2022-12-23 ENCOUNTER — Encounter: Payer: Self-pay | Admitting: Internal Medicine

## 2022-12-23 VITALS — BP 115/76 | HR 98 | Temp 99.0°F | Resp 28 | Ht 73.0 in | Wt 248.4 lb

## 2022-12-23 DIAGNOSIS — Z794 Long term (current) use of insulin: Secondary | ICD-10-CM | POA: Diagnosis not present

## 2022-12-23 DIAGNOSIS — Z7984 Long term (current) use of oral hypoglycemic drugs: Secondary | ICD-10-CM | POA: Diagnosis not present

## 2022-12-23 DIAGNOSIS — E119 Type 2 diabetes mellitus without complications: Secondary | ICD-10-CM

## 2022-12-23 DIAGNOSIS — L97511 Non-pressure chronic ulcer of other part of right foot limited to breakdown of skin: Secondary | ICD-10-CM | POA: Diagnosis not present

## 2022-12-23 DIAGNOSIS — E1140 Type 2 diabetes mellitus with diabetic neuropathy, unspecified: Secondary | ICD-10-CM

## 2022-12-23 DIAGNOSIS — E11621 Type 2 diabetes mellitus with foot ulcer: Secondary | ICD-10-CM | POA: Diagnosis not present

## 2022-12-23 DIAGNOSIS — G4700 Insomnia, unspecified: Secondary | ICD-10-CM

## 2022-12-23 LAB — GLUCOSE, CAPILLARY: Glucose-Capillary: 53 mg/dL — ABNORMAL LOW (ref 70–99)

## 2022-12-23 LAB — POCT GLYCOSYLATED HEMOGLOBIN (HGB A1C): Hemoglobin A1C: 6.1 % — AB (ref 4.0–5.6)

## 2022-12-23 MED ORDER — SANTYL 250 UNIT/GM EX OINT: 1.0000 | TOPICAL_OINTMENT | Freq: Every day | CUTANEOUS | 0 refills | Status: DC

## 2022-12-23 MED ORDER — OZEMPIC (2 MG/DOSE) 8 MG/3ML ~~LOC~~ SOPN: 2.0000 mg | PEN_INJECTOR | SUBCUTANEOUS | 3 refills | Status: DC

## 2022-12-23 MED ORDER — OZEMPIC (2 MG/DOSE) 8 MG/3ML ~~LOC~~ SOPN: 2.0000 mg | PEN_INJECTOR | SUBCUTANEOUS | 3 refills | Status: AC

## 2022-12-23 MED ORDER — ZOLPIDEM TARTRATE 5 MG PO TABS: 5.0000 mg | ORAL_TABLET | Freq: Every evening | ORAL | 0 refills | Status: DC | PRN

## 2022-12-23 NOTE — Progress Notes (Incomplete)
Denys A Eckman is here today for follow up post radiation to the pelvic.   They completed their radiation on: 07/11/2022      Does the patient complain of any of the following:   Pain:*** Abdominal bloating: *** Diarrhea/Constipation: *** Nausea/Vomiting: *** Vaginal Discharge: *** Blood in Urine or Stool: *** Urinary Issues (dysuria/incomplete emptying/ incontinence/ increased frequency/urgency): *** Does patient report using vaginal dilator 2-3 times a week and/or sexually active 2-3 weeks: *** Post radiation skin changes: ***     Additional comments if applicable:

## 2022-12-23 NOTE — Patient Instructions (Addendum)
Dear [name],  Thank you for trusting [name] with your care today.  We discussed the toe wound and your diabetes. For the toe wound, you likely do not need to use the doxycycline or clindamycin ointment anymore. We recommend that you try using santyl on the toe. Please do continue to keep your toe bandaged. We recommend that you call the wound care center to have the dead skin debrided.   For your diabetes, we will increase the ozempic to '2mg'$ . We will also check your A1c today. If it remains in the 6s, we would like to decrease your long actin insulin from 30 units to 15 units to prevent any low blood sugars.   Please return in 3 months for a follow up.

## 2022-12-23 NOTE — Progress Notes (Unsigned)
   CC: toe wound  HPI:[name] is a 62 y.o. female who presents for evaluation of toe wound. Please see individual problem based A/P for details.  Hx of DM2 with neuropathy, diabetic ulcer of left foot, ischemic wound on right foot, endometrial cancer last chemo tx sept 21st, not on chemo drugs currently only on Vit B12   Depression, PHQ-9: Based on the patients  [place] Visit from 05/18/2022 in [place]  PHQ-9 Total Score 3      score we have .  Past Medical History:  Diagnosis Date   Anemia    Asthma 11/01/2010   only when respiratory infections   BPPV (benign paroxysmal positional vertigo) 03/07/2013   Broken teeth 11/02/2016   Callus of toe 07/07/2022   Colon cancer screening 05/05/2020   Diabetes mellitus    since 1983   Dizziness 09/22/2021   Elevated TSH 05/13/2013   Endometrial cancer ([place])    Gangrene of toe of right foot ([place]) 04/14/2021   GERD (gastroesophageal reflux disease)    Hand pain, right 03/14/2019   History of radiation therapy    Vagina- 06/13/22-07/11/22-Dr. Gery Pray   Hypertension    Joint pain 05/26/2022   Left elbow pain 05/05/2016   Left shoulder pain 05/05/2016   Neuropathy in diabetes ([place]) 11/01/2010   Qualifier: Diagnosis of  By: Hassell Done FNP, [place]     Osteomyelitis [facility])    Pneumonia    as a child   Wrist pain 04/11/2013   Review of Systems:     Physical Exam: Vitals:   12/23/22 0836  BP: 115/76  Pulse: 98  Resp: (!) 28  Temp: 99 F (37.2 C)  TempSrc: Oral  SpO2: 100%  Weight: 248 lb 6.4 oz (112.7 kg)  Height: '6\' 1"'$  (1.854 m)     General: nad HEENT: Conjunctiva nl , antiicteric sclerae, moist mucous membranes, no exudate or erythema Cardiovascular: Normal rate, regular rhythm.  No murmurs, rubs, or gallops Pulmonary : Equal breath sounds, No wheezes, rales, or rhonchi Abdominal: soft, nontender,  bowel sounds present Ext: On clinical exam, she has faint pulses, but  still appears warm and well perfused. The toe wound without edema or erythema. Callus present without drainage   Assessment & Plan:   See Encounters Tab for problem based charting.  Patient seen with Dr. Evette Doffing

## 2022-12-23 NOTE — Assessment & Plan Note (Signed)
Patient with long standing issues of insomnia that have worsened over the last year. She had some initial response to Trazodone, but then it became less effective. She increased her own dose of Trazodone to '300mg'$  nightly, which I told her was too high of a dose and was unsafe. Sleep hygiene interventions not very effective for her. We talked about changing from Trazodone to zolpidem. I gave her clear warnings about side effects like falls and somnambulance. Also at risk for polypharmacy. We talked about not using this medicine every night, only on nights where sleep hygiene is ineffective. We decided to try zolpidem '5mg'$  to be used as needed, not nightly. I prescribed #30 tabs and we set a goal to make that last at least two months.

## 2022-12-23 NOTE — Progress Notes (Signed)
Hypoglycemic Event  CBG: 53   Treatment: 8 oz juice/soda  Symptoms: None  Follow-up CBG: Time:n/a  CBG Result:n/a (not obtained-pt preference)  Possible Reasons for Event: Inadequate meal intake  Comments/MD notified:MD Notified and pt heading to cafeteria for a meal     Regenia Skeeter, Aniceto Boss

## 2022-12-26 ENCOUNTER — Other Ambulatory Visit: Payer: Self-pay

## 2022-12-26 ENCOUNTER — Encounter: Payer: Self-pay | Admitting: Internal Medicine

## 2022-12-26 DIAGNOSIS — E11621 Type 2 diabetes mellitus with foot ulcer: Secondary | ICD-10-CM | POA: Insufficient documentation

## 2022-12-26 DIAGNOSIS — I152 Hypertension secondary to endocrine disorders: Secondary | ICD-10-CM

## 2022-12-26 DIAGNOSIS — E1142 Type 2 diabetes mellitus with diabetic polyneuropathy: Secondary | ICD-10-CM

## 2022-12-26 DIAGNOSIS — E1140 Type 2 diabetes mellitus with diabetic neuropathy, unspecified: Secondary | ICD-10-CM

## 2022-12-26 DIAGNOSIS — C541 Malignant neoplasm of endometrium: Secondary | ICD-10-CM

## 2022-12-26 MED ORDER — METFORMIN HCL ER 500 MG PO TB24: 2000.0000 mg | ORAL_TABLET | Freq: Every day | ORAL | 3 refills | Status: AC

## 2022-12-26 MED ORDER — LISINOPRIL 20 MG PO TABS: 20.0000 mg | ORAL_TABLET | Freq: Every day | ORAL | 11 refills | Status: DC

## 2022-12-26 MED ORDER — SANTYL 250 UNIT/GM EX OINT: 1.0000 | TOPICAL_OINTMENT | Freq: Every day | CUTANEOUS | 0 refills | Status: DC

## 2022-12-26 MED ORDER — MECLIZINE HCL 25 MG PO TABS: 25.0000 mg | ORAL_TABLET | Freq: Two times a day (BID) | ORAL | 3 refills | Status: AC | PRN

## 2022-12-26 MED ORDER — INSULIN LISPRO 100 UNIT/ML IJ SOLN: 7.0000 [IU] | Freq: Three times a day (TID) | INTRAMUSCULAR | 3 refills | Status: AC

## 2022-12-26 NOTE — Assessment & Plan Note (Signed)
Patient presenting with wound of right 5th toe for past two weeks. Reports she had callus present and applied a topical OTC cream to peel off the dead skin. Noticed wound afterwards. The wound was painful, though now less so. Initially was draining clear yellow. She follows with podiatry and wound care. Has not called either speciality stating she is familiar with their recommendations and likes to try to treat on her own before scheduling an appointment. She has been taking old doxycycling prescription for the past 5-6 days and using a topical clindamycin with bandage dressing for past 2 weeks. She has not attempted any debridement. Denies any fever, chills, redness or swelling of toe.  On clinical exam, she has faint pulses, but still appears warm and well perfused. The toe wound without edema or erythema. Callus present without drainage.   Last ABI was done May 2022 with normal right. ABI obtained in clinic does show abnormal findings right 1.42, with normal left of 1.17. No intervention at this time. For her toe wound, recommended Santyl and continued dressing, but advised patient she no longer needs systemic or topical abx. Advised she follow up with podiatry/wound care for further management/debridement.

## 2022-12-26 NOTE — Telephone Encounter (Signed)
Patient is not currently prescribed the Tramadol. She was receiving it through her oncologist who is no longer giving it to her and we gave one time fill couple months ago. I believe Dr. Evette Doffing discussed this with her that we are not continuing this medication.

## 2022-12-26 NOTE — Telephone Encounter (Signed)
Incoming fax from pharmacy: Collagenase Message:Please provide grams per applications and affected area. Need total grams per use per days thanks!

## 2022-12-26 NOTE — Assessment & Plan Note (Addendum)
Patient reports she has been doing well with her DM. She was having trouble tolerating ozempic '2mg'$  while on chemo regimen, but is asking to increase back to '2mg'$  now that chemo completed. Last A1c 6.1. She has had hypoglycemic episodes in the past. Denies lows, says highest CBG she has seen 178. Appetite has been more controlled  A1c 6.1 in the clinic. We will plan to increase ozempic to '2mg'$  and decrease her long acting insulin to 15 units to prevent hypoglycemia.  During visit, patients CBG was noted to be in the 50s. She was otherwise asymptomatic. Given juice/soda and Instructed to go to the cafeteria for meal which she voiced understanding and stated she would.

## 2022-12-26 NOTE — Addendum Note (Signed)
Addended by: Lalla Brothers T on: 12/26/2022 10:53 AM   Modules accepted: Level of Service

## 2022-12-26 NOTE — Progress Notes (Signed)
Internal Medicine Clinic Attending  I saw and evaluated the patient.  I personally confirmed the key portions of the history and exam documented by Dr. Gawaluck and I reviewed pertinent patient test results.  The assessment, diagnosis, and plan were formulated together and I agree with the documentation in the resident's note.  

## 2022-12-27 ENCOUNTER — Telehealth: Payer: Self-pay

## 2022-12-27 DIAGNOSIS — G4700 Insomnia, unspecified: Secondary | ICD-10-CM

## 2022-12-27 NOTE — Telephone Encounter (Signed)
Decision:Denied [place] (Key: [id number]) PA Case ID #: [id number] Rx #: [id number] Need Help? Call us at [phone] Outcome Denied today PA Case: [id number], Status: Denied. Notification: Completed. Drug Zolpidem Tartrate '5MG'$  tablets ePA cloud logo Form [facility] [place] Electronic [place] Form [phone] NCPDP) Original Claim Info 35 LIMIT OF 15 UNITS/MONTH EXCEEDED. [id number] OR SUBMIT PA TO [email or web address] DS O/R, USE PAMC [id number]

## 2022-12-27 NOTE — Telephone Encounter (Signed)
Prior Authorization for patient (zolpidem) came through on cover my meds was submitted with last office notes awaiting approval or denial

## 2022-12-28 ENCOUNTER — Telehealth: Payer: Self-pay | Admitting: *Deleted

## 2022-12-28 ENCOUNTER — Telehealth: Payer: Self-pay

## 2022-12-28 LAB — HM DIABETES EYE EXAM

## 2022-12-28 NOTE — Telephone Encounter (Signed)
Clarification of directions for use of Santyl ointment given to Pharmacist.

## 2022-12-28 NOTE — Progress Notes (Incomplete)
Radiation Oncology         [phone] ________________________________  Name: MIKI LABUDA MRN: [id number]  Date: 12/29/2022  DOB: 1961-10-12  Follow-Up Visit Note  CC: Angelique Blonder, Vara Guardian, MD    ICD-10-CM   1. 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) Staging form: Corpus Uteri - Carcinoma and Carcinosarcoma, AJCC 8th Edition - Pathologic stage from 04/08/2022: FIGO Stage IA (pT1a, pN0, cM0) - Signed by Heath Lark, MD on 04/08/2022  Interval Since Last Radiation: 5 months and 18 days    Intent: Curative  Radiation Treatment Dates: 06/13/2022 through 07/11/2022 Site Technique Total Dose (Gy) Dose per Fx (Gy) Completed Fx Beam Energies  Vagina: Pelvis HDR-brachy 30/30 6 5/5 Ir-192   Narrative:  The patient returns today for routine follow-up. She was last seen here for follow-up on 08/11/22. Since her last visit, the patient completed chemotherapy consisting of carboplatin and paclitaxel on 08/18/22 under the care of Dr. Alvy Bimler. The patient had issues with worsening neuropathy through out the course of systemic treatment, prompting taxol to be omitted for her final cycle of treatment.   On 09/03/22, the patient presented to the ED for evaluation of hypoglycemia secondary to misplacing her glucose monitor with continued use of insulin. Her blood sugar returned to baseline after being administered additional glucose.   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 2 of the visualized hepatic lesions were previously seen and compatible with benign  etiologies. There were several other indeterminate lesions which may have been new.   During her most recent follow-up visit with Dr. Berline Lopes on 09/20/22, the patient was noted to be doing well and NED on exam. She did endorse some residual neuropathy in her hands and feet as well as several more episodes of symptomatic hypoglycemia (now well-managed). She was also given a new vaginal dilator.   Given possible liver abnormalities noted on her CT from October, the patient presented for a follow-up CT of the abdomen and pelvis on 12/14/22 which showed likely benign findings, demonstrated by: fatty liver infiltration with a stable segment 2 hemangioma, as well as a stable faint low-attenuation small foci in segment 7 of the liver. CT also showed: improvement of the previously seen fluid in the dependent pelvis with some residual stranding noted, and slight soft tissue thickening along the fascial planes of the pelvis. CT otherwise showed no discrete mass lesion or lymph node enlargement.   Per her most recent follow-up visit with Dr. Elson Areas on 12/16/22, the patient was noted to be doing well and using less insulin compared to her prior visit.   ***  Allergies:  is allergic to nsaids and penicillins.  Meds: Current Outpatient Medications  Medication Sig Dispense Refill   Accu-Chek Softclix Lancets lancets CHECK BLOOD SUGAR 4 TIMES A DAY BEFORE MEALS AND BEDTIME 204 each 10   acetaminophen (TYLENOL) 650 MG CR tablet Take 1,300-1,950 mg by mouth every 8 (eight) hours as needed for pain.     atorvastatin (LIPITOR) 80 MG tablet Take 1 tablet (80 mg total) by mouth daily. 90 tablet 3   Blood  Glucose Monitoring Suppl (ACCU-CHEK GUIDE) w/Device KIT CHECK BLOOD SUGAR 4 TIMES A DAY BEFORE MEALS AND BEDTIME 1 kit 1   Blood Pressure Monitoring (BLOOD PRESSURE KIT) DEVI Use to check blood pressure daily 1 each 0   collagenase (SANTYL) 250 UNIT/GM ointment Apply 1 Application topically daily. Apply 1g to R 5th  toe daily. Total 1g per day. 90 g 0   cyanocobalamin (VITAMIN B12) 1000 MCG/ML injection Inject 1 mL (1,000 mcg total) into the muscle every 30 (thirty) days. 1 mL 11   glucose blood (ACCU-CHEK GUIDE) test strip Check blood sugar 4 times per day 125 each 12   insulin glargine (LANTUS SOLOSTAR) 100 UNIT/ML Solostar Pen Inject 30 Units into the skin at bedtime. 9 mL 2   insulin lispro (HUMALOG) 100 UNIT/ML injection Inject 0.07 mLs (7 Units total) into the skin 3 (three) times daily with meals. 10 mL 3   Insulin Syringe-Needle U-100 31G X 15/64" 0.5 ML MISC 1 Syringe by Does not apply route 3 (three) times daily. 50 each 3   lisinopril (ZESTRIL) 20 MG tablet Take 1 tablet (20 mg total) by mouth daily. 30 tablet 11   meclizine (ANTIVERT) 25 MG tablet Take 1 tablet (25 mg total) by mouth 2 (two) times daily as needed for dizziness. 60 tablet 3   metFORMIN (GLUCOPHAGE-XR) 500 MG 24 hr tablet Take 4 tablets (2,000 mg total) by mouth at bedtime. 360 tablet 3   Multiple Vitamin (MULTIVITAMIN WITH MINERALS) TABS tablet Take 1 tablet by mouth every evening.      Nutritional Supplements ([place]) LIQD Take 1 bottle twice daily between meals 237 mL 20   omeprazole (PRILOSEC) 20 MG capsule Take 1 capsule (20 mg total) by mouth daily. (Patient taking differently: Take 20 mg by mouth daily as needed (acid reflux).) 30 capsule 1   pregabalin (LYRICA) 75 MG capsule Take 1 capsule (75 mg total) by mouth 2 (two) times daily. 60 capsule 3   Semaglutide, 2 MG/DOSE, (OZEMPIC, 2 MG/DOSE,) 8 MG/3ML SOPN Inject 2 mg into the skin once a week. 3 mL 3   zolpidem (AMBIEN) 5 MG tablet Take 1 tablet (5 mg total) by mouth at bedtime as needed for sleep. 30 tablet 0   No current facility-administered medications for this encounter.    Physical Findings: The patient is in no acute distress. Patient is alert and oriented.  vitals were not taken for this visit. .  No significant changes. Lungs are clear to  auscultation bilaterally. Heart has regular rate and rhythm. No palpable cervical, supraclavicular, or axillary adenopathy. Abdomen soft, non-tender, normal bowel sounds.  On pelvic examination the external genitalia were unremarkable. A speculum exam was performed. There are no mucosal lesions noted in the vaginal vault. A Pap smear was obtained of the proximal vagina. On bimanual and rectovaginal examination there were no pelvic masses appreciated. ***    Lab Findings: Lab Results  Component Value Date   WBC 4.2 12/14/2022   HGB 10.0 (L) 12/14/2022   HCT 30.2 (L) 12/14/2022   MCV 104.5 (H) 12/14/2022   PLT 205 12/14/2022    Radiographic Findings: CT ABDOMEN PELVIS W CONTRAST  Result Date: 12/15/2022 CLINICAL DATA:  Cervical cancer. Assess treatment response * Tracking Code: BO * EXAM: CT ABDOMEN AND PELVIS WITH CONTRAST TECHNIQUE: Multidetector CT imaging of the abdomen and pelvis was performed using the standard protocol following bolus administration of intravenous contrast. RADIATION DOSE REDUCTION: This exam was performed according to the  departmental dose-optimization program which includes automated exposure control, adjustment of the mA and/or kV according to patient size and/or use of iterative reconstruction technique. CONTRAST:  168m OMNIPAQUE IOHEXOL 300 MG/ML  SOLN COMPARISON:  09/13/2022 FINDINGS: Lower chest: Slight linear opacity lung bases likely scar or atelectasis. No pleural effusion. Hepatobiliary: Mild fatty liver infiltration. There is a focal lesion identified showing progressive peripheral nodular enhancement in segment 2, unchanged from previous and consistent with a hemangioma measuring 3.6 x 2.7 cm. Gallbladder is contracted. Patent portal vein. There is a small calcification along the caudate lobe of liver, unchanged from previous and possibly sequela of previous infectious or inflammatory process. Tiny low-attenuation area in segment 7 is stable measuring 8 mm on  series 2, image 15. Nonspecific the second area described previously is not well seen on the current examination. Pancreas: Preserved pancreatic parenchyma. Spleen: Spleen is nonenlarged.  Preserved enhancement. Adrenals/Urinary Tract: Adrenal glands are preserved. No enhancing renal mass or collecting system dilatation. The uterus has a normal course and caliber down to the bladder. Preserved contours of the urinary bladder. Stomach/Bowel: On this non oral contrast exam, the large bowel has a normal course and caliber. Scattered colonic stool. Normal appendix in the right lower quadrant inferior to the cecum. The stomach is nondilated. Small bowel is nondilated. Vascular/Lymphatic: Normal caliber aorta and IVC. Mild vascular calcifications along the aorta. Once again there is some thrombus noted along an ectatic right gonadal vein, unchanged from previous. No specific abnormal lymph node enlargement identified in the abdomen and pelvis. Previously there was a 9 mm short axis right external iliac chain lymph node which is again suggested but again not pathologic. Reproductive: The uterus is not seen. On the prior there is a small amount of free fluid in the pelvis which is improved. Some residual stranding. Stable fascial thickening along the adnexa. Nonspecific. No focal soft tissue mass. Other: Mild skin thickening along the anterior abdominal wall. Slight subcutaneous fat stranding, nonspecific. Musculoskeletal: Scattered degenerative changes of the spine and pelvis. IMPRESSION: Again surgical changes from previous hysterectomy. The previous fluid in the dependent pelvis is improved with some residual stranding. Slight soft tissue thickening along the fascial planes of the pelvis but no discrete mass lesion or lymph node enlargement. Stable in the right gonadal, ovarian vein. Small amount of thrombus is possible and stable. Fatty liver infiltration with a segment 2 hemangioma. Faint low-attenuation small foci in  segment 7 of the liver on the prior today 1 is stable in the other is not as well seen. Recommend continued surveillance Aortic atherosclerosis ICD10 cone-I 70 filling defect Electronically Signed   By: AJill SideM.D.   On: 12/15/2022 10:27    Impression: The encounter diagnosis was 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([place].   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  ***  Plan:  ***   *** minutes of total time was spent for this patient encounter, including preparation, face-to-face counseling with the patient and coordination of care, physical exam, and documentation of the encounter. ____________________________________  JBlair Promise PhD, MD  This document serves as a record of services personally performed by JGery Pray MD. It was created on his behalf by ERoney Mans a trained medical scribe. The creation of this record is based on the scribe's personal observations and the provider's statements to them. This document has been checked and approved by the attending provider.

## 2022-12-28 NOTE — Telephone Encounter (Signed)
Prior Authorization for patient Paramedic) came through on cover my meds, prior authorization submitted but a message from cover my meds popped up: "Available without authorization"

## 2022-12-29 ENCOUNTER — Telehealth: Payer: Self-pay | Admitting: *Deleted

## 2022-12-29 ENCOUNTER — Ambulatory Visit
Admission: RE | Admit: 2022-12-29 | Discharge: 2022-12-29 | Disposition: A | Discharge status: Home / Self Care | Payer: Medicaid Other | Source: Ambulatory Visit | Attending: Radiation Oncology | Admitting: Radiation Oncology

## 2022-12-29 DIAGNOSIS — C541 Malignant neoplasm of endometrium: Secondary | ICD-10-CM

## 2022-12-29 MED ORDER — ZOLPIDEM TARTRATE 5 MG PO TABS: 5.0000 mg | ORAL_TABLET | Freq: Every evening | ORAL | 0 refills | Status: DC | PRN

## 2022-12-29 NOTE — Telephone Encounter (Signed)
Insurance did not authorize my rx of ambien, I think because the quantity I sent exceeded their limit of #15 for 30 days. I will resend an rx for #15 tabs. Hopefully that will be approved. Please update the patient that we are working on this, may be ready soon.

## 2022-12-29 NOTE — Addendum Note (Signed)
Addended by: Truddie Crumble on: 12/29/2022 02:45 PM   Modules accepted: Orders

## 2022-12-29 NOTE — Telephone Encounter (Signed)
RETURNED PATIENT'S PHONE CALL, LVM FOR A RETURN CALL 

## 2022-12-29 NOTE — Telephone Encounter (Signed)
Call to patient informed her that a new prescription for Ambien 15 tablets been sent to pharmacy.  Her insurance will only cover 15 tablets for 30 days.  Patient voiced understanding of and wait for pharmacy to call for pick up.

## 2022-12-30 ENCOUNTER — Telehealth: Payer: Self-pay

## 2022-12-30 NOTE — Telephone Encounter (Signed)
Pa  for  pt ( SANTYL 250 UNIT/GM OINTMENT ) came  through  on cover my meds  was submitted with last OV notes .Marland Kitchen Awaiting approval or denial ....      UPDATE :     [facility] has not replied to your PA request. Turnaround time for review of a PA request is dependent upon insurance plan and can range from 24 hours to 5 calendar days.

## 2023-01-02 NOTE — Telephone Encounter (Signed)
DECISION :  DENIED   Outcome Denied on February 2  PA Case: [id number], Status: Denied. Notification: Completed.   Drug Santyl 250UNIT/GM ointment ePA cloud logo   Form [facility] [place] Electronic [place] Form [phone] NCPDP)       ( THERE WAS NO REASON  PROVEN  SO I AM NOT SURE WHY IT WAS DENIED )

## 2023-01-02 NOTE — Telephone Encounter (Addendum)
Decision:Denied  More information has been placed in doctors box.

## 2023-02-10 ENCOUNTER — Inpatient Hospital Stay: Payer: Medicaid Other | Attending: Gynecologic Oncology

## 2023-02-10 DIAGNOSIS — Z452 Encounter for adjustment and management of vascular access device: Secondary | ICD-10-CM | POA: Diagnosis present

## 2023-02-10 DIAGNOSIS — C541 Malignant neoplasm of endometrium: Secondary | ICD-10-CM | POA: Diagnosis present

## 2023-02-10 MED ORDER — SODIUM CHLORIDE 0.9% FLUSH
10.0000 mL | Freq: Once | INTRAVENOUS | Status: AC
  Administered 2023-02-10: 10 mL

## 2023-02-10 MED ORDER — HEPARIN SOD (PORK) LOCK FLUSH 100 UNIT/ML IV SOLN
500.0000 [IU] | Freq: Once | INTRAVENOUS | Status: AC
  Administered 2023-02-10: 500 [IU]

## 2023-03-24 ENCOUNTER — Other Ambulatory Visit: Payer: Self-pay | Admitting: Internal Medicine

## 2023-03-24 DIAGNOSIS — E1142 Type 2 diabetes mellitus with diabetic polyneuropathy: Secondary | ICD-10-CM

## 2023-04-07 ENCOUNTER — Inpatient Hospital Stay: Payer: Medicaid Other | Attending: Gynecologic Oncology

## 2023-04-12 ENCOUNTER — Telehealth: Payer: Self-pay

## 2023-04-12 NOTE — Telephone Encounter (Signed)
Pa for pt ( SANTYL )  was resubmitted with last office notes and foot mr with a pic .Marland Kitchen Per PCP  request .. Awaiting approval or denial

## 2023-04-12 NOTE — Telephone Encounter (Signed)
Decision:Denied This is a courtesy notification to advise you that we do not cover the above-requested medication under this members benefit plan.  Please note,a copy of the denial letter that we sent to the member containing the appeal and/or grievance process will be mailed to your office. The member was sent a notice of action regarding this denial.

## 2023-04-13 NOTE — Telephone Encounter (Signed)
Called patient and offered her an appointment to discuss previous message. Patient stated she no longer comes here. I verified with the patient if she has transferred her care she said "Yes I go to [facility]". Please advise.

## 2023-04-18 ENCOUNTER — Emergency Department (HOSPITAL_COMMUNITY)
Admission: EM | Admit: 2023-04-18 | Discharge: 2023-04-18 | Disposition: A | Discharge status: Home / Self Care | Payer: Medicaid Other | Attending: Emergency Medicine | Admitting: Emergency Medicine

## 2023-04-18 ENCOUNTER — Other Ambulatory Visit: Payer: Self-pay

## 2023-04-18 ENCOUNTER — Encounter (HOSPITAL_COMMUNITY): Payer: Self-pay

## 2023-04-18 ENCOUNTER — Emergency Department (HOSPITAL_COMMUNITY): Payer: Medicaid Other

## 2023-04-18 DIAGNOSIS — R079 Chest pain, unspecified: Secondary | ICD-10-CM | POA: Diagnosis present

## 2023-04-18 DIAGNOSIS — R0789 Other chest pain: Secondary | ICD-10-CM

## 2023-04-18 DIAGNOSIS — I10 Essential (primary) hypertension: Secondary | ICD-10-CM | POA: Diagnosis not present

## 2023-04-18 DIAGNOSIS — Z8542 Personal history of malignant neoplasm of other parts of uterus: Secondary | ICD-10-CM | POA: Diagnosis not present

## 2023-04-18 DIAGNOSIS — E119 Type 2 diabetes mellitus without complications: Secondary | ICD-10-CM | POA: Diagnosis not present

## 2023-04-18 DIAGNOSIS — Z794 Long term (current) use of insulin: Secondary | ICD-10-CM | POA: Insufficient documentation

## 2023-04-18 DIAGNOSIS — Z79899 Other long term (current) drug therapy: Secondary | ICD-10-CM | POA: Diagnosis not present

## 2023-04-18 DIAGNOSIS — Z7984 Long term (current) use of oral hypoglycemic drugs: Secondary | ICD-10-CM | POA: Diagnosis not present

## 2023-04-18 DIAGNOSIS — M545 Low back pain, unspecified: Secondary | ICD-10-CM | POA: Insufficient documentation

## 2023-04-18 LAB — TROPONIN I (HIGH SENSITIVITY)
Troponin I (High Sensitivity): 2 ng/L (ref <18)
Troponin I (High Sensitivity): 2 ng/L (ref <18)

## 2023-04-18 LAB — CBC
HCT: 33.9 % — ABNORMAL LOW (ref 36.0–46.0)
Hemoglobin: 10.6 g/dL — ABNORMAL LOW (ref 12.0–15.0)
MCH: 32.1 pg (ref 26.0–34.0)
MCHC: 31.3 g/dL (ref 30.0–36.0)
MCV: 102.7 fL — ABNORMAL HIGH (ref 80.0–100.0)
Platelets: 207 10*3/uL (ref 150–400)
RBC: 3.3 MIL/uL — ABNORMAL LOW (ref 3.87–5.11)
RDW: 12.6 % (ref 11.5–15.5)
WBC: 7.4 10*3/uL (ref 4.0–10.5)
nRBC: 0 % (ref 0.0–0.2)

## 2023-04-18 LAB — BASIC METABOLIC PANEL
Anion gap: 13 (ref 5–15)
BUN: 14 mg/dL (ref 8–23)
CO2: 23 mmol/L (ref 22–32)
Calcium: 9.4 mg/dL (ref 8.9–10.3)
Chloride: 102 mmol/L (ref 98–111)
Creatinine, Ser: 0.81 mg/dL (ref 0.44–1.00)
GFR, Estimated: >60 mL/min (ref >60)
Glucose, Bld: 177 mg/dL — ABNORMAL HIGH (ref 70–99)
Potassium: 4.2 mmol/L (ref 3.5–5.1)
Sodium: 138 mmol/L (ref 135–145)

## 2023-04-18 LAB — D-DIMER, QUANTITATIVE: D-Dimer, Quant: 0.52 ug/mL-FEU — ABNORMAL HIGH (ref 0.00–0.50)

## 2023-04-18 MED ORDER — CHLORHEXIDINE GLUCONATE CLOTH 2 % EX PADS: 6.0000 | MEDICATED_PAD | Freq: Every day | CUTANEOUS | Status: DC

## 2023-04-18 MED ORDER — HEPARIN SOD (PORK) LOCK FLUSH 100 UNIT/ML IV SOLN
500.0000 [IU] | Freq: Once | INTRAVENOUS | Status: AC
  Administered 2023-04-18: 500 [IU]
  Filled 2023-04-18: qty 5

## 2023-04-18 MED ORDER — SODIUM CHLORIDE 0.9% FLUSH: 10.0000 mL | Freq: Two times a day (BID) | INTRAVENOUS | Status: DC

## 2023-04-18 MED ORDER — SODIUM CHLORIDE 0.9% FLUSH: 10.0000 mL | INTRAVENOUS | Status: DC | PRN

## 2023-04-18 NOTE — ED Notes (Signed)
Pt refused bw in triage. Pt states she wants her port accessed for the blood

## 2023-04-18 NOTE — ED Triage Notes (Signed)
Pt c/o lower back to mid back for two weeks then yesterday radiated up to left shoulder neck and jaw. Pt c/o nausea. Pt c/o left chest pressure

## 2023-04-18 NOTE — Discharge Instructions (Addendum)
Follow-up with your doctor.  Workup here is negative return for any worse shortness of breath or any new or worse symptoms.  Also given referral to cardiology that he can call.  Your heart markers today both were normal.  As we discussed recommend extra strength Tylenol every 8 hours and you can take that along with the tramadol.

## 2023-04-18 NOTE — ED Provider Notes (Signed)
[place] EMERGENCY DEPARTMENT AT [facility] Provider Note   CSN: [id number] Arrival date & time: 04/18/23  1326     History  Chief Complaint  Patient presents with   Chest Pain    [name] is a 62 y.o. female.  Patient is a 62 year old female with a past medical history of prior endometrial cancer status post hysterectomy and chemotherapy completed in September, hypertension and diabetes presenting to the emergency department with chest and back pain.  Patient states about 2 weeks ago she was having some lower back pain that she attributed to heavy lifting or sleeping wrong.  She states that about a week ago the pain started moving up to the left mid back.  She states over the last 2 to 3 days the pain started radiating into her left arm and today was radiating into her left side of her chest and up the left side of her neck which prompted her to come to the emergency department.  She states that she was driving in the car when her symptoms worsened.  She denied any shortness of breath.  She states that she did feel diaphoretic yesterday but had no diaphoresis today.  She denies any nausea or vomiting or lower extremity swelling.  She denies any known history of blood clots or personal cardiac history.  The history is provided by the patient.  Chest Pain      Home Medications Prior to Admission medications   Medication Sig Start Date End Date Taking? Authorizing Provider  Accu-Chek Softclix Lancets lancets CHECK BLOOD SUGAR 4 TIMES A DAY BEFORE MEALS AND BEDTIME 09/03/22   Long, Arlyss Repress, MD  acetaminophen (TYLENOL) 650 MG CR tablet Take 1,300-1,950 mg by mouth every 8 (eight) hours as needed for pain.    [provider]  atorvastatin (LIPITOR) 80 MG tablet Take 1 tablet (80 mg total) by mouth daily. 09/23/21   Steffanie Rainwater, MD  Blood Glucose Monitoring Suppl (ACCU-CHEK GUIDE) w/Device KIT CHECK BLOOD SUGAR 4 TIMES A DAY BEFORE MEALS AND BEDTIME  09/03/22   Long, Arlyss Repress, MD  Blood Pressure Monitoring (BLOOD PRESSURE KIT) DEVI Use to check blood pressure daily 07/05/22   Steffanie Rainwater, MD  collagenase (SANTYL) 250 UNIT/GM ointment Apply 1 Application topically daily. Apply 1g to R 5th toe daily. Total 1g per day. 12/26/22   Mercie Eon, MD  cyanocobalamin (VITAMIN B12) 1000 MCG/ML injection Inject 1 mL (1,000 mcg total) into the muscle every 30 (thirty) days. 10/27/22   Artis Delay, MD  glucose blood (ACCU-CHEK GUIDE) test strip Check blood sugar 4 times per day 08/31/22   Multani, Bhupinder, MD  insulin glargine (LANTUS SOLOSTAR) 100 UNIT/ML Solostar Pen Inject 30 Units into the skin at bedtime. 06/25/22 09/23/22  Steffanie Rainwater, MD  insulin lispro (HUMALOG) 100 UNIT/ML injection Inject 0.07 mLs (7 Units total) into the skin 3 (three) times daily with meals. 12/26/22   Mercie Eon, MD  Insulin Syringe-Needle U-100 31G X 15/64" 0.5 ML MISC 1 Syringe by Does not apply route 3 (three) times daily. 08/26/22   Masters, Katie, DO  lisinopril (ZESTRIL) 20 MG tablet Take 1 tablet (20 mg total) by mouth daily. 12/26/22 12/26/23  Mercie Eon, MD  meclizine (ANTIVERT) 25 MG tablet Take 1 tablet (25 mg total) by mouth 2 (two) times daily as needed for dizziness. 12/26/22   Mercie Eon, MD  metFORMIN (GLUCOPHAGE-XR) 500 MG 24 hr tablet Take 4 tablets (2,000 mg total) by  mouth at bedtime. 12/26/22 12/26/23  Mercie Eon, MD  Multiple Vitamin (MULTIVITAMIN WITH MINERALS) TABS tablet Take 1 tablet by mouth every evening.     [provider]  Nutritional Supplements (ENSURE ACTIVE HEART HEALTH) LIQD Take 1 bottle twice daily between meals 07/05/22   Steffanie Rainwater, MD  omeprazole (PRILOSEC) 20 MG capsule Take 1 capsule (20 mg total) by mouth daily. Patient taking differently: Take 20 mg by mouth daily as needed (acid reflux). 05/04/20   Angelita Ingles, MD  pregabalin (LYRICA) 75 MG capsule Take 1 capsule (75 mg total) by mouth 2 (two)  times daily. 10/03/22 10/03/23  Masters, Katie, DO  Semaglutide, 2 MG/DOSE, (OZEMPIC, 2 MG/DOSE,) 8 MG/3ML SOPN Inject 2 mg into the skin once a week. 12/23/22   Adron Bene, MD  zolpidem (AMBIEN) 5 MG tablet Take 1 tablet (5 mg total) by mouth at bedtime as needed for sleep. 12/29/22   Tyson Alias, MD      Allergies    Nsaids and Penicillins    Review of Systems   Review of Systems  Cardiovascular:  Positive for chest pain.    Physical Exam Updated Vital Signs BP 123/71   Pulse (!) 105   Temp 99.1 F (37.3 C) (Oral)   Resp 18   Ht 6\' 1"  (1.854 m)   Wt 112.7 kg   LMP 06/23/2014   SpO2 100%   BMI 32.78 kg/m  Physical Exam Vitals and nursing note reviewed.  Constitutional:      General: She is not in acute distress.    Appearance: She is well-developed.  HENT:     Head: Normocephalic and atraumatic.  Eyes:     Extraocular Movements: Extraocular movements intact.  Cardiovascular:     Rate and Rhythm: Normal rate and regular rhythm.     Pulses:          Radial pulses are 2+ on the right side and 2+ on the left side.       Dorsalis pedis pulses are 2+ on the right side and 2+ on the left side.     Heart sounds: Normal heart sounds.  Pulmonary:     Effort: Pulmonary effort is normal.     Breath sounds: Normal breath sounds.  Chest:     Chest wall: No tenderness.  Abdominal:     Palpations: Abdomen is soft.     Tenderness: There is no abdominal tenderness.  Musculoskeletal:        General: Normal range of motion.     Cervical back: Normal range of motion and neck supple.     Right lower leg: No edema.     Left lower leg: No edema.     Comments: No midline back tenderness Left-sided trapezius muscle tenderness to palpation  Skin:    General: Skin is warm and dry.  Neurological:     General: No focal deficit present.     Mental Status: She is alert and oriented to person, place, and time.     Motor: No weakness.  Psychiatric:        Mood and Affect:  Mood normal.        Behavior: Behavior normal.     ED Results / Procedures / Treatments   Labs (all labs ordered are listed, but only abnormal results are displayed) Labs Reviewed  BASIC METABOLIC PANEL  CBC  D-DIMER, QUANTITATIVE  TROPONIN I (HIGH SENSITIVITY)  TROPONIN I (HIGH SENSITIVITY)    EKG EKG Interpretation  Date/Time:  Tuesday Apr 18 2023 13:26:24 EDT Ventricular Rate:  106 PR Interval:  152 QRS Duration: 64 QT Interval:  322 QTC Calculation: 427 R Axis:   80 Text Interpretation: Sinus tachycardia Otherwise normal ECG No significant change since last tracing Confirmed by Elayne Snare ([id number]) on 04/18/2023 2:34:50 PM  Radiology DG Chest 2 View  Result Date: 04/18/2023 CLINICAL DATA:  cp EXAM: CHEST - 2 VIEW COMPARISON:  CXR 06/22/22 FINDINGS: No pleural effusion. No pneumothorax. No focal airspace opacity. Right chest port in place with tip at the cavoatrial junction. No radiographically apparent displaced rib fractures. Visualized upper abdomen is unremarkable. Vertebral body heights are maintained. IMPRESSION: No focal airspace opacity. Electronically Signed   By: Lorenza Cambridge M.D.   On: 04/18/2023 14:23    Procedures Procedures    Medications Ordered in ED Medications  sodium chloride flush (NS) 0.9 % injection 10-40 mL (has no administration in time range)  sodium chloride flush (NS) 0.9 % injection 10-40 mL (has no administration in time range)  Chlorhexidine Gluconate Cloth 2 % PADS 6 each (has no administration in time range)    ED Course/ Medical Decision Making/ A&P Clinical Course as of 04/18/23 1628  Tue Apr 18, 2023  1627 Patient signed out to Dr. Deretha Emory pending labs and reassessment. [VK]    Clinical Course User Index [VK] Rexford Maus, DO                             Medical Decision Making This patient presents to the ED with chief complaint(s) of chest/back pain with pertinent past medical history of prior endometrial  cancer, hypertension, diabetes which further complicates the presenting complaint. The complaint involves an extensive differential diagnosis and also carries with it a high risk of complications and morbidity.    The differential diagnosis includes ACS, arrhythmia, anemia, pneumonia, pneumothorax, pulmonary edema, pleural effusion, considering PE with her tachycardia and history of cancer, dissection unlikely as she has no neurologic deficits and due to the chronicity of her pain, musculoskeletal strain  Additional history obtained: Additional history obtained from N/A Records reviewed Care Everywhere/External Records and Primary Care Documents  ED Course and Reassessment: On patient's arrival to the emergency department she was mildly tachycardic and otherwise hemodynamically stable in no acute distress.  EKG on arrival showed no acute ischemic changes.  Patient will have labs including troponin and D-dimer as well as chest x-ray performed.  She declined any pain control at this time.  Independent labs interpretation:  The following labs were independently interpreted: pending  Independent visualization of imaging: - I independently visualized the following imaging with scope of interpretation limited to determining acute life threatening conditions related to emergency care: CXR, which revealed no acute disease     Amount and/or Complexity of Data Reviewed Labs: ordered. Radiology: ordered.  Risk OTC drugs.          Final Clinical Impression(s) / ED Diagnoses Final diagnoses:  None    Rx / DC Orders ED Discharge Orders     None         Rexford Maus, DO 04/18/23 1628

## 2023-04-18 NOTE — ED Provider Notes (Signed)
Patient symptoms are atypical.  Troponins x 2 both were 2.  There is significant delay in getting the second troponin resulted.  But not significantly elevated.  Patient's basic metabolic panel normal and glucose 177 CBC white count 7.4 hemoglobin 10.6 hematocrit was 33.9 and her platelets were 207.  Patient did have a D-dimer that technically slightly elevated at 0.52 but adjusted for age she is 75 it is not elevated.  Patient's chest x-ray showed no focal airspace disease.  EKG did not have anything significant.  Patient stable for discharge home and follow-up with her primary care doctor.  Patient will be given work note.  Recommended that she do extra strength Tylenol every 8 hours and that she continue to use her tramadol that she has at home.  Patient will return for any new or worse symptoms   Vanetta Mulders, MD 04/18/23 2106

## 2023-05-02 ENCOUNTER — Inpatient Hospital Stay: Payer: Medicaid Other | Attending: Gynecologic Oncology

## 2023-05-02 ENCOUNTER — Other Ambulatory Visit: Payer: Self-pay

## 2023-05-02 DIAGNOSIS — Z452 Encounter for adjustment and management of vascular access device: Secondary | ICD-10-CM | POA: Insufficient documentation

## 2023-05-02 DIAGNOSIS — E538 Deficiency of other specified B group vitamins: Secondary | ICD-10-CM | POA: Insufficient documentation

## 2023-05-02 DIAGNOSIS — C541 Malignant neoplasm of endometrium: Secondary | ICD-10-CM | POA: Diagnosis present

## 2023-05-02 LAB — COMPREHENSIVE METABOLIC PANEL
ALT: 10 U/L (ref 0–44)
AST: 13 U/L — ABNORMAL LOW (ref 15–41)
Albumin: 4 g/dL (ref 3.5–5.0)
Alkaline Phosphatase: 69 U/L (ref 38–126)
Anion gap: 8 (ref 5–15)
BUN: 18 mg/dL (ref 8–23)
CO2: 26 mmol/L (ref 22–32)
Calcium: 9.1 mg/dL (ref 8.9–10.3)
Chloride: 107 mmol/L (ref 98–111)
Creatinine, Ser: 0.78 mg/dL (ref 0.44–1.00)
GFR, Estimated: >60 mL/min (ref >60)
Glucose, Bld: 106 mg/dL — ABNORMAL HIGH (ref 70–99)
Potassium: 4.5 mmol/L (ref 3.5–5.1)
Sodium: 141 mmol/L (ref 135–145)
Total Bilirubin: 0.3 mg/dL (ref 0.3–1.2)
Total Protein: 7 g/dL (ref 6.5–8.1)

## 2023-05-02 LAB — CBC WITH DIFFERENTIAL/PLATELET
Abs Immature Granulocytes: 0.02 10*3/uL (ref 0.00–0.07)
Basophils Absolute: 0 10*3/uL (ref 0.0–0.1)
Basophils Relative: 0 %
Eosinophils Absolute: 0.1 10*3/uL (ref 0.0–0.5)
Eosinophils Relative: 1 %
HCT: 29.5 % — ABNORMAL LOW (ref 36.0–46.0)
Hemoglobin: 9.5 g/dL — ABNORMAL LOW (ref 12.0–15.0)
Immature Granulocytes: 0 %
Lymphocytes Relative: 45 %
Lymphs Abs: 2.5 10*3/uL (ref 0.7–4.0)
MCH: 32.3 pg (ref 26.0–34.0)
MCHC: 32.2 g/dL (ref 30.0–36.0)
MCV: 100.3 fL — ABNORMAL HIGH (ref 80.0–100.0)
Monocytes Absolute: 0.3 10*3/uL (ref 0.1–1.0)
Monocytes Relative: 5 %
Neutro Abs: 2.7 10*3/uL (ref 1.7–7.7)
Neutrophils Relative %: 49 %
Platelets: 261 10*3/uL (ref 150–400)
RBC: 2.94 MIL/uL — ABNORMAL LOW (ref 3.87–5.11)
RDW: 12.7 % (ref 11.5–15.5)
WBC: 5.5 10*3/uL (ref 4.0–10.5)
nRBC: 0 % (ref 0.0–0.2)

## 2023-05-02 LAB — VITAMIN B12: Vitamin B-12: 90 pg/mL — ABNORMAL LOW (ref 180–914)

## 2023-05-02 MED ORDER — SODIUM CHLORIDE 0.9% FLUSH
10.0000 mL | Freq: Once | INTRAVENOUS | Status: AC
  Administered 2023-05-02: 10 mL

## 2023-05-02 MED ORDER — HEPARIN SOD (PORK) LOCK FLUSH 100 UNIT/ML IV SOLN
500.0000 [IU] | Freq: Once | INTRAVENOUS | Status: AC
  Administered 2023-05-02: 500 [IU]

## 2023-05-03 ENCOUNTER — Telehealth: Payer: Self-pay

## 2023-05-03 ENCOUNTER — Other Ambulatory Visit: Payer: Self-pay | Admitting: Hematology and Oncology

## 2023-05-03 NOTE — Telephone Encounter (Signed)
-----   Message from Artis Delay, MD sent at 05/03/2023 10:50 AM EDT ----- I told the lab not to run any orders by my name She came in yesterday with labs from PCP to get labs drawn from her port They ran cbc, cmp and B12 and now her B12 is low Can you ask if she is still giving herself B12 injection monthly? If not, please refill

## 2023-05-03 NOTE — Telephone Encounter (Signed)
Called and given below message. Pt states she never started the injections because she is not comfortable giving IM injections at home. Pt reports she is not willing to learn and there is no one that could give the injections for her. Pt states she is willing to come into the office for monthly injections.  Per MD Pt will need to come in once a week for one month and then monthly due low B12 level.  Pt verbalized understanding. Scheduling message sent.

## 2023-05-04 ENCOUNTER — Telehealth: Payer: Self-pay | Admitting: Hematology and Oncology

## 2023-05-04 NOTE — Telephone Encounter (Signed)
Spoke with patient confirming upcoming appointments added

## 2023-05-09 ENCOUNTER — Inpatient Hospital Stay: Payer: Medicaid Other

## 2023-05-09 VITALS — BP 100/53 | HR 93 | Temp 98.6°F | Resp 18

## 2023-05-09 DIAGNOSIS — C541 Malignant neoplasm of endometrium: Secondary | ICD-10-CM | POA: Diagnosis not present

## 2023-05-09 MED ORDER — CYANOCOBALAMIN 1000 MCG/ML IJ SOLN
1000.0000 ug | Freq: Once | INTRAMUSCULAR | Status: AC
  Administered 2023-05-09: 1000 ug via INTRAMUSCULAR
  Filled 2023-05-09: qty 1

## 2023-05-09 NOTE — Progress Notes (Signed)
Patient here for B12 injection, BP 100/53. She states she feels fine she just has not been eating much and she does take hypertensive medication. MD notified. Patient educated on staying hydrated and monitoring BP and symptoms at home; she stated she understood and would call if there is any concerns.

## 2023-05-16 ENCOUNTER — Inpatient Hospital Stay: Payer: Medicaid Other

## 2023-05-16 ENCOUNTER — Other Ambulatory Visit: Payer: Self-pay

## 2023-05-16 VITALS — BP 104/51 | HR 95 | Temp 98.8°F | Resp 16

## 2023-05-16 DIAGNOSIS — C541 Malignant neoplasm of endometrium: Secondary | ICD-10-CM | POA: Diagnosis not present

## 2023-05-16 MED ORDER — CYANOCOBALAMIN 1000 MCG/ML IJ SOLN
1000.0000 ug | Freq: Once | INTRAMUSCULAR | Status: AC
  Administered 2023-05-16: 1000 ug via INTRAMUSCULAR
  Filled 2023-05-16: qty 1

## 2023-05-18 ENCOUNTER — Encounter (HOSPITAL_BASED_OUTPATIENT_CLINIC_OR_DEPARTMENT_OTHER): Payer: Medicaid Other | Admitting: Internal Medicine

## 2023-05-23 ENCOUNTER — Other Ambulatory Visit: Payer: Self-pay

## 2023-05-23 ENCOUNTER — Inpatient Hospital Stay: Payer: Medicaid Other

## 2023-05-23 VITALS — BP 123/62 | HR 88 | Temp 97.7°F | Resp 17

## 2023-05-23 DIAGNOSIS — C541 Malignant neoplasm of endometrium: Secondary | ICD-10-CM | POA: Diagnosis not present

## 2023-05-23 MED ORDER — CYANOCOBALAMIN 1000 MCG/ML IJ SOLN
1000.0000 ug | Freq: Once | INTRAMUSCULAR | Status: AC
  Administered 2023-05-23: 1000 ug via INTRAMUSCULAR
  Filled 2023-05-23: qty 1

## 2023-05-30 ENCOUNTER — Telehealth: Payer: Self-pay | Admitting: Hematology and Oncology

## 2023-05-30 ENCOUNTER — Inpatient Hospital Stay: Payer: Medicaid Other | Attending: Gynecologic Oncology

## 2023-05-30 DIAGNOSIS — C541 Malignant neoplasm of endometrium: Secondary | ICD-10-CM

## 2023-05-30 DIAGNOSIS — E538 Deficiency of other specified B group vitamins: Secondary | ICD-10-CM | POA: Diagnosis present

## 2023-05-30 MED ORDER — CYANOCOBALAMIN 1000 MCG/ML IJ SOLN
1000.0000 ug | Freq: Once | INTRAMUSCULAR | Status: AC
  Administered 2023-05-30: 1000 ug via INTRAMUSCULAR

## 2023-05-30 NOTE — Telephone Encounter (Signed)
Spoke with patient confirming upcoming appointment change  

## 2023-06-02 NOTE — Progress Notes (Deleted)
Cardiology Office Note:   Date:  06/02/2023  NAME:  [name]    MRN: [id number] DOB:  23-Sep-1961   PCP:  Pcp, No  Cardiologist:  None  Electrophysiologist:  None   Referring MD: Vanetta Mulders, MD   No chief complaint on file.   History of Present Illness:   [name] is a 62 y.o. female with a hx of HTN, DM who is being seen today for the evaluation of chest pain at the request of Vanetta Mulders, MD. Seen in ER 04/19/2023 for CP. Negative work-up.   ***  Problem List HTN DM -A1c 6.1  Past Medical History: Past Medical History:  Diagnosis Date   Anemia    Asthma 11/01/2010   only when respiratory infections   BPPV (benign paroxysmal positional vertigo) 03/07/2013   Broken teeth 11/02/2016   Callus of toe 07/07/2022   Colon cancer screening 05/05/2020   Diabetes mellitus    since 1983   Dizziness 09/22/2021   Elevated TSH 05/13/2013   Endometrial cancer (HCC)    Gangrene of toe of right foot (HCC) 04/14/2021   GERD (gastroesophageal reflux disease)    Hand pain, right 03/14/2019   History of radiation therapy    Vagina- 06/13/22-07/11/22-Dr. 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   Qualifier: Diagnosis of  By: Daphine Deutscher FNP, [place]     Osteomyelitis [facility])    Pneumonia    as a child   Wrist pain 04/11/2013    Past Surgical History: Past Surgical History:  Procedure Laterality Date   ABDOMINAL HYSTERECTOMY     ACHILLES TENDON LENGTHENING Left 08/29/2014   Procedure: Left Achilles Lengthening;  Surgeon: Nadara Mustard, MD;  Location: [facility] OR;  Service: Orthopedics;  Laterality: Left;   AMPUTATION Left 02/19/2013   Procedure: Amputation of Left Great Toe;  Surgeon: Kathryne Hitch, MD;  Location[facility] OR;  Service: Orthopedics;  Laterality: Left;   AMPUTATION Left 08/29/2014   Procedure: Left Foot 2nd and 1st Toe Amputation;   Surgeon: Nadara Mustard, MD;  Location: [facility] OR;  Service: Orthopedics;  Laterality: Left;   CARDIAC CATHETERIZATION     15 years ago   CESAREAN SECTION     x 4   CYSTOSCOPY  03/30/2022   Procedure: CYSTOSCOPY;  Surgeon: Carver Fila, MD;  Location: [facility] ORS;  Service: Gynecology;;   IR IMAGING GUIDED PORT INSERTION  04/19/2022   ROBOTIC ASSISTED TOTAL HYSTERECTOMY WITH BILATERAL SALPINGO OOPHERECTOMY Bilateral 03/30/2022   Procedure: XI ROBOTIC ASSISTED TOTAL HYSTERECTOMY WITH BILATERAL SALPINGO-OOPHORECTOMY, MINI LAPAROTOMY;  Surgeon: Carver Fila, MD;  Location: [facility] ORS;  Service: Gynecology;  Laterality: Bilateral;   SENTINEL NODE BIOPSY N/A 03/30/2022   Procedure: SENTINEL NODE BIOPSY;  Surgeon: Carver Fila, MD;  Location: [facility] ORS;  Service: Gynecology;  Laterality: N/A;   TUBAL LIGATION     at one of c-sections    Current Medications: No outpatient medications have been marked as taking for the 06/07/23 encounter (Appointment) with O'Neal, Ronnald Ramp, MD.     Allergies:    Nsaids and Penicillins   Social History: Social History   Socioeconomic History   Marital status: Divorced    Spouse name: Not on file   Number of children: 3   Years of education: Not on file   Highest education level: Not on file  Occupational  History   Not on file  Tobacco Use   Smoking status: Former    Types: Cigarettes    Quit date: 11/28/1962    Years since quitting: 60.5   Smokeless tobacco: Never  Vaping Use   Vaping Use: Never used  Substance and Sexual Activity   Alcohol use: No    Alcohol/week: 0.0 standard drinks of alcohol   Drug use: No   Sexual activity: Not Currently  Other Topics Concern   Not on file  Social History Narrative   Not on file   Social Determinants of Health   Financial Resource Strain: Not on file  Food Insecurity: No Food Insecurity (12/23/2022)   Hunger Vital Sign    Worried About Running Out of Food in the Last Year: Never true    Ran  Out of Food in the Last Year: Never true  Transportation Needs: Unmet Transportation Needs (12/23/2022)   PRAPARE - Administrator, Civil Service (Medical): Yes    Lack of Transportation (Non-Medical): Yes  Physical Activity: Not on file  Stress: Not on file  Social Connections: Moderately Integrated (12/23/2022)   Social Connection and Isolation Panel [NHANES]    Frequency of Communication with Friends and Family: More than three times a week    Frequency of Social Gatherings with Friends and Family: More than three times a week    Attends Religious Services: More than 4 times per year    Active Member of [employer] or Organizations: Yes    Attends Engineer, structural: More than 4 times per year    Marital Status: Divorced     Family History: The patient's family history includes Dementia in her mother; Diabetes in her daughter and father; Hypertension in her father and mother. There is no history of Colon cancer, Breast cancer, Ovarian cancer, Endometrial cancer, Pancreatic cancer, or Prostate cancer.  ROS:   All other ROS reviewed and negative. Pertinent positives noted in the HPI.     EKGs/Labs/Other Studies Reviewed:   The following studies were personally reviewed by me today:  EKG:  EKG is *** ordered today.        Recent Labs: 05/02/2023: ALT 10; BUN 18; Creatinine, Ser 0.78; Hemoglobin 9.5; Platelets 261; Potassium 4.5; Sodium 141   Recent Lipid Panel    Component Value Date/Time   CHOL 210 (H) 09/22/2021 0954   TRIG 148 09/22/2021 0954   HDL 59 09/22/2021 0954   CHOLHDL 3.6 09/22/2021 0954   CHOLHDL 3.5 02/25/2020 0334   VLDL 38 02/25/2020 0334   LDLCALC 125 (H) 09/22/2021 0954    Physical Exam:   VS:  LMP 06/23/2014    Wt Readings from Last 3 Encounters:  04/18/23 248 lb 7.3 oz (112.7 kg)  12/23/22 248 lb 6.4 oz (112.7 kg)  12/16/22 252 lb (114.3 kg)    General: Well nourished, well developed, in no acute distress Head: Atraumatic, normal  size  Eyes: PEERLA, EOMI  Neck: Supple, no JVD Endocrine: No thryomegaly Cardiac: Normal S1, S2; RRR; no murmurs, rubs, or gallops Lungs: Clear to auscultation bilaterally, no wheezing, rhonchi or rales  Abd: Soft, nontender, no hepatomegaly  Ext: No edema, pulses 2+ Musculoskeletal: No deformities, BUE and BLE strength normal and equal Skin: Warm and dry, no rashes   Neuro: Alert and oriented to person, place, time, and situation, CNII-XII grossly intact, no focal deficits  Psych: Normal mood and affect   ASSESSMENT:   LOUGENE KALAJIAN is a 62 y.o. female  who presents for the following: No diagnosis found.  PLAN:   There are no diagnoses linked to this encounter.  {Are you ordering a CV Procedure (e.g. stress test, cath, DCCV, TEE, etc)?   Press F2        :[id number]}  Disposition: No follow-ups on file.  Medication Adjustments/Labs and Tests Ordered: Current medicines are reviewed at length with the patient today.  Concerns regarding medicines are outlined above.  No orders of the defined types were placed in this encounter.  No orders of the defined types were placed in this encounter.  There are no Patient Instructions on file for this visit.   Time Spent with Patient: I have spent a total of *** minutes with patient reviewing hospital notes, telemetry, EKGs, labs and examining the patient as well as establishing an assessment and plan that was discussed with the patient.  > 50% of time was spent in direct patient care.  Signed, Lenna Gilford. Flora Lipps, MD, [facility]  [facility]  [address] [place], [place] [address] [phone]  06/02/2023 11:05 AM

## 2023-06-07 ENCOUNTER — Encounter: Payer: Self-pay | Admitting: Cardiovascular Disease

## 2023-06-07 ENCOUNTER — Ambulatory Visit: Payer: Medicaid Other | Attending: Cardiovascular Disease | Admitting: Cardiovascular Disease

## 2023-06-07 DIAGNOSIS — R072 Precordial pain: Secondary | ICD-10-CM

## 2023-06-14 ENCOUNTER — Other Ambulatory Visit: Payer: Self-pay | Admitting: Internal Medicine

## 2023-06-14 DIAGNOSIS — E1140 Type 2 diabetes mellitus with diabetic neuropathy, unspecified: Secondary | ICD-10-CM

## 2023-06-15 ENCOUNTER — Ambulatory Visit (HOSPITAL_BASED_OUTPATIENT_CLINIC_OR_DEPARTMENT_OTHER): Payer: Medicaid Other | Admitting: Internal Medicine

## 2023-06-23 ENCOUNTER — Inpatient Hospital Stay: Payer: Medicaid Other | Admitting: Hematology and Oncology

## 2023-06-23 ENCOUNTER — Inpatient Hospital Stay: Payer: Medicaid Other

## 2023-06-24 ENCOUNTER — Other Ambulatory Visit: Payer: Self-pay | Admitting: Internal Medicine

## 2023-06-24 DIAGNOSIS — Z794 Long term (current) use of insulin: Secondary | ICD-10-CM

## 2023-06-25 IMAGING — XA IR IMAGING GUIDED PORT INSERTION
2 series · 2 of 2 positions shown · non-contrast
Comparison: none

CLINICAL DATA: Endometrial carcinosarcoma and need for porta cath
for chemotherapy.

[Series 1: ir fluoro/shunt/fist · 1 of 1 slices shown]
[im 1/1]
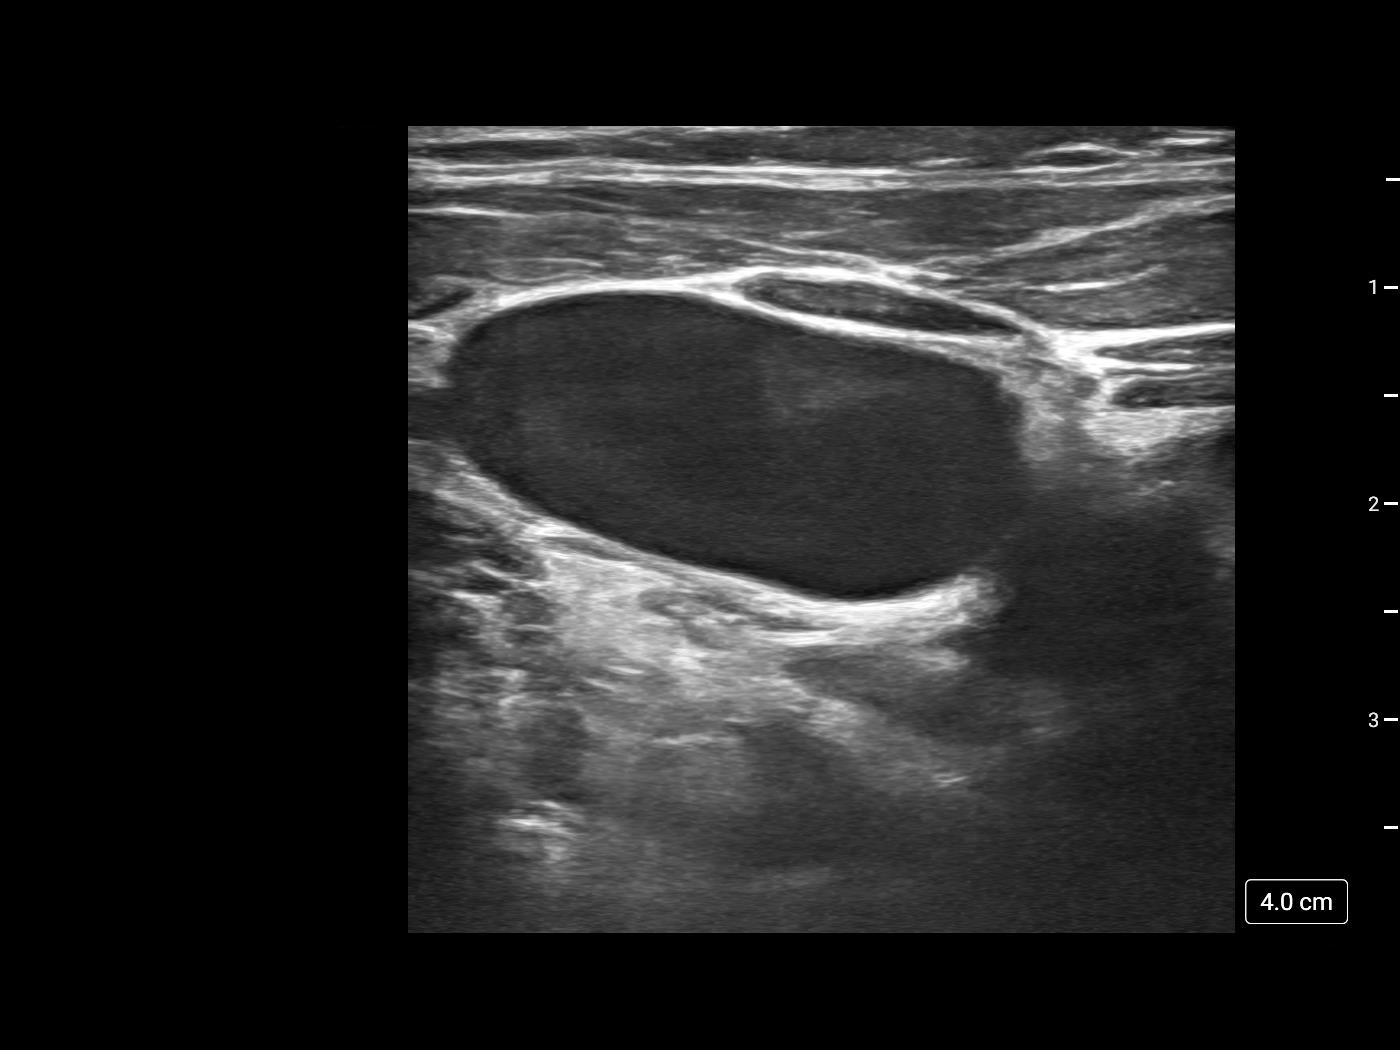

[aw electronic film · 1 of 1 slices shown]
[im 1/1]
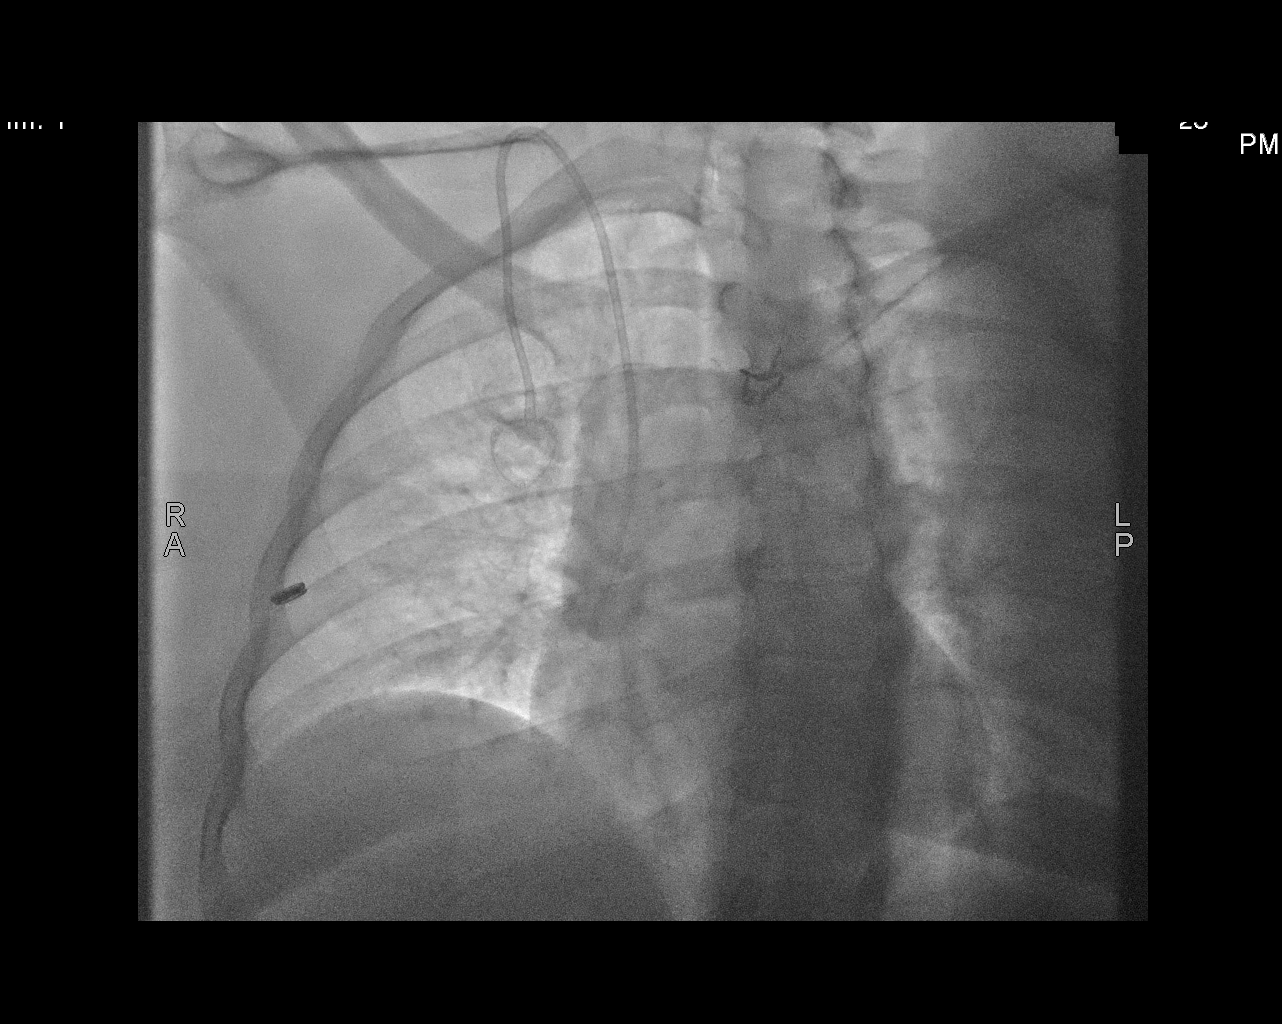

[2 of 2 positions shown; findings below may reference images not displayed]

EXAM:
IMPLANTED PORT A CATH PLACEMENT WITH ULTRASOUND AND FLUOROSCOPIC
GUIDANCE

ANESTHESIA/SEDATION:
Moderate (conscious) sedation was employed during this procedure. A
total of Versed 3.0 mg and Fentanyl 100 mcg was administered
intravenously by radiology nursing.

Moderate Sedation Time: 37 minutes. The patient's level of
consciousness and vital signs were monitored continuously by
radiology nursing throughout the procedure under my direct
supervision.

FLUOROSCOPY:
42 seconds.  4.0 mGy.

PROCEDURE:
The procedure, risks, benefits, and alternatives were explained to
the patient. Questions regarding the procedure were encouraged and
answered. The patient understands and consents to the procedure. A
time-out was performed prior to initiating the procedure.

Ultrasound was utilized to confirm patency of the right internal
jugular vein. The right neck and chest were prepped with
chlorhexidine in a sterile fashion, and a sterile drape was applied
covering the operative field. Maximum barrier sterile technique with
sterile gowns and gloves were used for the procedure. Local
anesthesia was provided with 1% lidocaine.

After creating a small venotomy incision, a 21 gauge needle was
advanced into the right internal jugular vein under direct,
real-time ultrasound guidance. Ultrasound image documentation was
performed. After securing guidewire access, an 8 Fr dilator was
placed. A J-wire was kinked to measure appropriate catheter length.

A subcutaneous port pocket was then created along the upper chest
wall utilizing sharp and blunt dissection. Portable cautery was
utilized. The pocket was irrigated with sterile saline.

A single lumen power injectable port was chosen for placement. The 8
Fr catheter was tunneled from the port pocket site to the venotomy
incision. The port was placed in the pocket. External catheter was
trimmed to appropriate length based on guidewire measurement.

At the venotomy, an 8 Fr peel-away sheath was placed over a
guidewire. The catheter was then placed through the sheath and the
sheath removed. Final catheter positioning was confirmed and
documented with a fluoroscopic spot image. The port was accessed
with a needle and aspirated and flushed with heparinized saline. The
access needle was removed.

The venotomy and port pocket incisions were closed with subcutaneous
3-0 Monocryl and subcuticular 4-0 Vicryl. Dermabond was applied to
both incisions.

COMPLICATIONS:
COMPLICATIONS
None
FINDINGS: After catheter placement, the tip lies at the Wowfixit junction.
The catheter aspirates normally and is ready for immediate use.
IMPRESSION: Placement of single lumen port a cath via right internal jugular
vein. The catheter tip lies at the Wowfixit junction. A power
injectable port a cath was placed and is ready for immediate use.

## 2023-06-29 ENCOUNTER — Other Ambulatory Visit: Payer: Self-pay

## 2023-06-29 ENCOUNTER — Encounter: Payer: Self-pay | Admitting: *Deleted

## 2023-06-29 ENCOUNTER — Inpatient Hospital Stay (HOSPITAL_BASED_OUTPATIENT_CLINIC_OR_DEPARTMENT_OTHER): Payer: Medicaid Other | Admitting: Hematology and Oncology

## 2023-06-29 ENCOUNTER — Inpatient Hospital Stay: Payer: Medicaid Other | Attending: Gynecologic Oncology

## 2023-06-29 ENCOUNTER — Encounter: Payer: Self-pay | Admitting: Hematology and Oncology

## 2023-06-29 VITALS — BP 121/60 | HR 92 | Temp 97.8°F | Resp 18 | Ht 73.0 in | Wt 242.2 lb

## 2023-06-29 DIAGNOSIS — E669 Obesity, unspecified: Secondary | ICD-10-CM

## 2023-06-29 DIAGNOSIS — C541 Malignant neoplasm of endometrium: Secondary | ICD-10-CM | POA: Diagnosis present

## 2023-06-29 DIAGNOSIS — E538 Deficiency of other specified B group vitamins: Secondary | ICD-10-CM | POA: Diagnosis present

## 2023-06-29 DIAGNOSIS — Z6830 Body mass index (BMI) 30.0-30.9, adult: Secondary | ICD-10-CM | POA: Insufficient documentation

## 2023-06-29 DIAGNOSIS — Z452 Encounter for adjustment and management of vascular access device: Secondary | ICD-10-CM | POA: Insufficient documentation

## 2023-06-29 LAB — CMP (CANCER CENTER ONLY)
ALT: 14 U/L (ref 0–44)
AST: 16 U/L (ref 15–41)
Albumin: 3.7 g/dL (ref 3.5–5.0)
Alkaline Phosphatase: 80 U/L (ref 38–126)
Anion gap: 9 (ref 5–15)
BUN: 12 mg/dL (ref 8–23)
CO2: 25 mmol/L (ref 22–32)
Calcium: 9.2 mg/dL (ref 8.9–10.3)
Chloride: 109 mmol/L (ref 98–111)
Creatinine: 0.89 mg/dL (ref 0.44–1.00)
GFR, Estimated: >60 mL/min (ref >60)
Glucose, Bld: 35 mg/dL — CL (ref 70–99)
Potassium: 3.8 mmol/L (ref 3.5–5.1)
Sodium: 143 mmol/L (ref 135–145)
Total Bilirubin: 0.5 mg/dL (ref 0.3–1.2)
Total Protein: 7 g/dL (ref 6.5–8.1)

## 2023-06-29 LAB — CBC WITH DIFFERENTIAL/PLATELET
Abs Immature Granulocytes: 0.01 10*3/uL (ref 0.00–0.07)
Basophils Absolute: 0 10*3/uL (ref 0.0–0.1)
Basophils Relative: 0 %
Eosinophils Absolute: 0.1 10*3/uL (ref 0.0–0.5)
Eosinophils Relative: 1 %
HCT: 30.5 % — ABNORMAL LOW (ref 36.0–46.0)
Hemoglobin: 10.1 g/dL — ABNORMAL LOW (ref 12.0–15.0)
Immature Granulocytes: 0 %
Lymphocytes Relative: 51 %
Lymphs Abs: 2.9 10*3/uL (ref 0.7–4.0)
MCH: 32.4 pg (ref 26.0–34.0)
MCHC: 33.1 g/dL (ref 30.0–36.0)
MCV: 97.8 fL (ref 80.0–100.0)
Monocytes Absolute: 0.3 10*3/uL (ref 0.1–1.0)
Monocytes Relative: 6 %
Neutro Abs: 2.4 10*3/uL (ref 1.7–7.7)
Neutrophils Relative %: 42 %
Platelets: 242 10*3/uL (ref 150–400)
RBC: 3.12 MIL/uL — ABNORMAL LOW (ref 3.87–5.11)
RDW: 13.9 % (ref 11.5–15.5)
WBC: 5.7 10*3/uL (ref 4.0–10.5)
nRBC: 0 % (ref 0.0–0.2)

## 2023-06-29 MED ORDER — CYANOCOBALAMIN 1000 MCG/ML IJ SOLN
1000.0000 ug | Freq: Once | INTRAMUSCULAR | Status: AC
  Administered 2023-06-29: 1000 ug via INTRAMUSCULAR
  Filled 2023-06-29: qty 1

## 2023-06-29 MED ORDER — SODIUM CHLORIDE 0.9% FLUSH
10.0000 mL | Freq: Once | INTRAVENOUS | Status: AC
  Administered 2023-06-29: 10 mL

## 2023-06-29 MED ORDER — HEPARIN SOD (PORK) LOCK FLUSH 100 UNIT/ML IV SOLN
500.0000 [IU] | Freq: Once | INTRAVENOUS | Status: AC
  Administered 2023-06-29: 500 [IU]

## 2023-06-29 NOTE — Assessment & Plan Note (Signed)
She is not symptomatic I will space out her future imaging study, next imaging study would be January of next year She has appointment pending to see radiation oncologist and GYN surgeon I will see her again in a few months for further follow-up

## 2023-06-29 NOTE — Progress Notes (Signed)
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 I will space out her future imaging study, next imaging study would be January of next year She has appointment pending to see radiation oncologist and GYN surgeon I will see her again in a few months for further follow-up  Vitamin B12 deficiency She has severe vitamin B12 deficiency She will continue B12 injection monthly I plan to recheck it again in a few months  Obesity, Class I, BMI 30-34.9 She is doing very well and is losing weight I congratulated her effort  Orders Placed This Encounter  Procedures   Vitamin B12    Standing Status:   Future    Standing Expiration Date:   06/28/2024    All questions were answered. The patient knows to call the clinic with any problems, questions or concerns. The total time spent in the appointment was 20 minutes encounter with patients including review of chart and various tests results, discussions about plan of care and coordination of care plan   Artis Delay, MD 06/29/2023 2:52 PM  INTERVAL HISTORY: Please see below for problem oriented charting. she returns for treatment follow-up She is receiving vitamin B12 injection monthly She is doing well and losing weight She denies abdominal pain or changes in her bowel habits or bloating REVIEW OF SYSTEMS:   Constitutional: Denies fevers, chills or abnormal weight loss Eyes: Denies blurriness of vision Ears, nose, mouth, throat, and face: Denies mucositis or sore throat Respiratory: Denies cough, dyspnea or wheezes Cardiovascular: Denies palpitation, chest discomfort or lower extremity swelling Gastrointestinal:  Denies nausea, heartburn or change in bowel habits Skin: Denies abnormal skin rashes Lymphatics: Denies new lymphadenopathy or easy bruising Neurological:Denies numbness, tingling or new  weaknesses Behavioral/Psych: Mood is stable, no new changes  All other systems were reviewed with the patient and are negative.  I have reviewed the past medical history, past surgical history, social history and family history with the patient and they are unchanged from previous note.  ALLERGIES:  is allergic to nsaids and penicillins.  MEDICATIONS:  Current Outpatient Medications  Medication Sig Dispense Refill   Accu-Chek Softclix Lancets lancets CHECK BLOOD SUGAR 4 TIMES A DAY BEFORE MEALS AND BEDTIME 204 each 10   acetaminophen (TYLENOL) 650 MG CR tablet Take 1,300-1,950 mg by mouth every 8 (eight) hours as needed for pain.     atorvastatin (LIPITOR) 80 MG tablet Take 1 tablet (80 mg total) by mouth daily. 90 tablet 3   Blood Glucose Monitoring Suppl (ACCU-CHEK GUIDE) w/Device KIT CHECK BLOOD SUGAR 4 TIMES A DAY BEFORE MEALS AND BEDTIME 1 kit 1   Blood Pressure Monitoring (BLOOD PRESSURE KIT) DEVI Use to check blood pressure daily 1 each 0   collagenase (SANTYL) 250 UNIT/GM ointment Apply 1 Application topically daily. Apply 1g to R 5th toe daily. Total 1g per day. 90 g 0   cyanocobalamin (VITAMIN B12) 1000 MCG/ML injection Inject 1 mL (1,000 mcg total) into the muscle every 30 (thirty) days. 1 mL 11   glucose blood (ACCU-CHEK GUIDE) test strip Check blood sugar 4 times per day 125 each 12   insulin glargine (LANTUS SOLOSTAR) 100 UNIT/ML Solostar Pen Inject 30 Units into the skin at bedtime. 9 mL 2   insulin lispro (HUMALOG) 100 UNIT/ML injection Inject 0.07 mLs (7 Units total) into the skin 3 (three) times daily with meals. 10 mL 3  Insulin Syringe-Needle U-100 31G X 15/64" 0.5 ML MISC 1 Syringe by Does not apply route 3 (three) times daily. 50 each 3   lisinopril (ZESTRIL) 20 MG tablet Take 1 tablet (20 mg total) by mouth daily. 30 tablet 11   meclizine (ANTIVERT) 25 MG tablet Take 1 tablet (25 mg total) by mouth 2 (two) times daily as needed for dizziness. 60 tablet 3   metFORMIN  (GLUCOPHAGE-XR) 500 MG 24 hr tablet Take 4 tablets (2,000 mg total) by mouth at bedtime. 360 tablet 3   Multiple Vitamin (MULTIVITAMIN WITH MINERALS) TABS tablet Take 1 tablet by mouth every evening.      Nutritional Supplements (ENSURE ACTIVE HEART HEALTH) LIQD Take 1 bottle twice daily between meals 237 mL 20   omeprazole (PRILOSEC) 20 MG capsule Take 1 capsule (20 mg total) by mouth daily. (Patient taking differently: Take 20 mg by mouth daily as needed (acid reflux).) 30 capsule 1   pregabalin (LYRICA) 75 MG capsule Take 1 capsule (75 mg total) by mouth 2 (two) times daily. 60 capsule 3   Semaglutide, 2 MG/DOSE, (OZEMPIC, 2 MG/DOSE,) 8 MG/3ML SOPN Inject 2 mg into the skin once a week. 3 mL 3   zolpidem (AMBIEN) 5 MG tablet Take 1 tablet (5 mg total) by mouth at bedtime as needed for sleep. 15 tablet 0   No current facility-administered medications for this visit.    SUMMARY OF ONCOLOGIC HISTORY: Oncology History Overview Note  P53 wild type   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(HCC)  02/01/2022 Initial Diagnosis   She presented with postmenopausal bleeding   02/07/2022 Imaging   US pelvis Thickened heterogeneous endometrium with masslike region with internal vascularity. Findings may be associated with endometrial carcinoma or hyperplasia. Biopsy is recommended for further evaluation.   02/22/2022 Initial Biopsy   EMB: MMMT/carcinosarcoma (predominance of sarcomatous component)   03/05/2022 Imaging   Ct chest, abdomen and pelvis 1. Heterogeneous enlargement of the endometrial cavity is compatible with the reported history of uterine carcinosarcoma. No definite involvement of the posterior bladder wall or sigmoid colon/rectum. 2. 9 mm short axis right external iliac node is upper normal for size. Attention on follow-up recommended. Otherwise no lymphadenopathy in the abdomen or pelvis. 3. 3.6 x 2.7 cm lesion in the lateral segment left liver has subtle peripheral nodular enhancement. This is  almost certainly a benign cavernous hemangioma, but MRI abdomen with and without contrast recommended to confirm. 4. Aortic Atherosclerosis (ICD10-I70.0).   03/08/2022 Initial Diagnosis   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(HCC)   03/30/2022 Surgery   TRH/BSO, SLN biopsy on right, left pelvic LND, LOS, mini-lap  Findings: On EUA, 12cm minimally mobile uterus. On intra-abdominal exam, normal upper abdominal survey. Omentum adherent to the anterior abdominal wall along the prior midline incision. Normal omentum otherwise, normal small and large bowel. 12 cm uterus densely adherent to the anterior abdominal wall, obliterating some of the anterior anatomy including the anterior cul de sac. Normal appearing bilateral adnexa. Mapping successful to right obturator SLN, mildly enlarged. Dye seen within the parametrium on the left, no SLNs identified. No obvious adenopathy on the left. Decision made given length of surgery, comorbitidies to defer left para-aortic lymphadenectomy. Mini-lap required for specimen delivery. Dome intact on cystoscopy and good efflux noted from bilateral ureteral orifices.   03/30/2022 Pathology Results   MMMT/carcinosarcoma, 6.3 cm MI 1cm or 3 (<50%) Cervical stroma, bilateral tubes/ovaries benign R SLN and L pelvic LNDs negative  ONCOLOGY TABLE:   UTERUS, CARCINOMA OR CARCINOSARCOMA: Resection  Procedure: Total hysterectomy and bilateral salpingo-oophorectomy  Histologic Type:  Malignant mixed Mullerian tumor (MMMT/ carcinosarcoma)  Histologic Grade: High-grade  Myometrial Invasion:       Depth of Myometrial Invasion (mm): 10 mm       Myometrial Thickness (mm): 30 mm       Percentage of Myometrial Invasion: 33%  Uterine Serosa Involvement: Not identified  Cervical stromal Involvement: Not identified  Extent of involvement of other tissue/organs: Not identified  Peritoneal/Ascitic Fluid: Not applicable  Lymphovascular Invasion: Not identified  Regional Lymph Nodes:        Pelvic Lymph Nodes Examined:                                   1 Sentinel                                   6 Non-sentinel                                   7 Total       Pelvic Lymph Nodes with Metastasis: 0                           Macrometastasis: (>2.0 mm): 0                           Micrometastasis: (>0.2 mm and < 2.0 mm): 0                           Isolated Tumor Cells (<0.2 mm): 0                           Laterality of Lymph Node with Tumor: Not  applicable                           Extracapsular Extension: Not applicable       Para-aortic Lymph Nodes Examined:                                    0 Sentinel                                    0 non-sentinel                                    0 total  Distant Metastasis:       Distant Site(s) Involved: Not applicable  Pathologic Stage Classification (pTNM, AJCC 8th Edition): pT1a, pN0  Ancillary Studies: MMR / MSI testing will be ordered  Representative Tumor Block: B1  Comment(s): Pancytokeratin was performed on the lymph nodes and is  negative.    04/08/2022 Cancer Staging   Staging form: Corpus Uteri - Carcinoma and Carcinosarcoma, AJCC 8th Edition - Pathologic stage from 04/08/2022: FIGO Stage IA (pT1a, pN0, cM0) - Signed by Artis Delay, MD on 04/08/2022 Stage prefix: Initial diagnosis  04/21/2022 Procedure   Placement of single lumen port a cath via right internal jugular vein. The catheter tip lies at the cavo-atrial junction. A power injectable port a cath was placed and is ready for immediate use.       04/29/2022 - 08/18/2022 Chemotherapy   Patient is on Treatment Plan : UTERINE Carboplatin AUC 6 / Paclitaxel q21d     06/13/2022 - 07/11/2022 Radiation Therapy   06/13/2022 through 07/11/2022 Site Technique Total Dose (Gy) Dose per Fx (Gy) Completed Fx Beam Energies  Vagina: Pelvis HDR-brachy 30/30 6 5/5 Ir-192     09/14/2022 Imaging   Narrative & Impression   EXAM: CT ABDOMEN AND PELVIS WITH CONTRAST    TECHNIQUE: Multidetector CT imaging of the abdomen and pelvis was performed using the standard protocol following bolus administration of intravenous contrast.   RADIATION DOSE REDUCTION: This exam was performed according to the departmental dose-optimization program which includes automated exposure control, adjustment of the mA and/or kV according to patient size and/or use of iterative reconstruction technique.   CONTRAST:  OMNIPAQUE IOHEXOL 300 MG/ML  SOLN   COMPARISON:  March 04, 2022   FINDINGS: Lower chest: Basilar atelectasis. No effusion or consolidative changes.   Hepatobiliary: LEFT hepatic lobe lesion at 3.4 cm is unchanged since April of 2023 with peripheral and nodular enhancement and centripetal filling noted on previous imaging compatible with large hepatic hemangioma.   Small hypodensity in the posterior RIGHT hemiliver not definitely seen on previous imaging (image 19/2) 8 mm, perhaps present on the prior study but masked by artifact from the patient's arm.   Low-density focus in the posterior RIGHT hemiliver, hepatic subsegment VII (image 16/2) 11 mm, this is stable. There is background hepatic steatosis.   Subtle area of low attenuation in the RIGHT hemiliver (image 31/2) 9 mm. Portal vein is patent. No pericholecystic stranding or signs of biliary duct distension.   Pancreas: Normal, without mass, inflammation or ductal dilatation.   Spleen: Normal.   Adrenals/Urinary Tract:   Adrenal glands are unremarkable. Symmetric renal enhancement. No sign of hydronephrosis. No suspicious renal lesion or perinephric stranding.   Urinary bladder is grossly unremarkable.   Stomach/Bowel: No acute gastrointestinal findings. Appendix is normal.   Vascular/Lymphatic:   Aortic atherosclerosis. No sign of aneurysm. Smooth contour of the IVC. There is no gastrohepatic or hepatoduodenal ligament lymphadenopathy. No retroperitoneal or mesenteric lymphadenopathy.   No  pelvic sidewall lymphadenopathy.   Mild expansion of the RIGHT ovarian vein with central low attenuation that suggests thrombus within ligated ovarian vein. Delayed images without signs of propagation into the IVC.   Reproductive: Post hysterectomy. Small amount of fluid in the surgical bed (image 81/2) no peripheral enhancement. This area measuring 4.2 x 1.9 cm. Scattered areas of fluid density which are smaller, other areas less than a cm. Mild fascial thickening along the RIGHT pelvic sidewall. (Image 75/2) mild serosal thickening of the sigmoid colon is suggested. No peritoneal nodularity outside of the pelvis.   Other: As above   Musculoskeletal: No acute bone finding. No destructive bone process. Spinal degenerative changes.   IMPRESSION: 1. Post hysterectomy. Small amount of fluid in the surgical bed no peripheral enhancement. This area measuring 4.2 x 1.9 cm. This and other findings in the pelvis such as serosal thickening along the sigmoid and fascial thickening along the RIGHT pelvis may reflect postoperative changes. Would suggest close attention on follow-up to exclude the possibility of peritoneal involvement. 2. Mild expansion of the RIGHT ovarian vein  with central low attenuation suggests ovarian venous thrombus. 3. Hepatic lesions 2 of which were definitively present on prior imaging in hepatic subsegment VII and in the lateral segment of the LEFT hepatic lobe, lateral segment LEFT hepatic lobe lesion is compatible with benign hemangioma. There are 2 lesions which may be new and or mast on the prior study by artifact and are indeterminate. Suggest hepatic MRI for further assessment. At the time of hepatic MRI, MRV sequences could be obtained if warranted to assess for RIGHT ovarian venous thrombosis. 4. Background hepatic steatosis. 5. Aortic atherosclerosis, mild.     12/14/2022 Imaging   CT scan  Again surgical changes from previous hysterectomy. The previous fluid in the  dependent pelvis is improved with some residual stranding. Slight soft tissue thickening along the fascial planes of the pelvis but no discrete mass lesion or lymph node enlargement.   Stable in the right gonadal, ovarian vein. Small amount of thrombus is possible and stable.   Fatty liver infiltration with a segment 2 hemangioma. Faint low-attenuation small foci in segment 7 of the liver on the prior today 1 is stable in the other is not as well seen. Recommend continued surveillance   Aortic atherosclerosis      PHYSICAL EXAMINATION: ECOG PERFORMANCE STATUS: 0 - Asymptomatic  Vitals:   06/29/23 1249  BP: 121/60  Pulse: 92  Resp: 18  Temp: 97.8 F (36.6 C)  SpO2: 100%   Filed Weights   06/29/23 1249  Weight: 242 lb 3.2 oz (109.9 kg)    GENERAL:alert, no distress and comfortable NEURO: alert & oriented x 3 with fluent speech, no focal motor/sensory deficits  LABORATORY DATA:  I have reviewed the data as listed    Component Value Date/Time   NA 143 06/29/2023 1204   NA 139 09/22/2021 0954   K 3.8 06/29/2023 1204   CL 109 06/29/2023 1204   CO2 25 06/29/2023 1204   GLUCOSE 35 (LL) 06/29/2023 1204   BUN 12 06/29/2023 1204   BUN 13 09/22/2021 0954   CREATININE 0.89 06/29/2023 1204   CREATININE 0.67 04/23/2015 1645   CALCIUM 9.2 06/29/2023 1204   PROT 7.0 06/29/2023 1204   PROT 7.3 09/22/2021 0954   ALBUMIN 3.7 06/29/2023 1204   ALBUMIN 4.4 09/22/2021 0954   AST 16 06/29/2023 1204   ALT 14 06/29/2023 1204   ALKPHOS 80 06/29/2023 1204   BILITOT 0.5 06/29/2023 1204   GFRNONAA >60 06/29/2023 1204   GFRNONAA >89 01/06/2014 1643   GFRAA >60 02/25/2020 0334   GFRAA >89 01/06/2014 1643    No results found for: "SPEP", "UPEP"  Lab Results  Component Value Date   WBC 5.7 06/29/2023   NEUTROABS 2.4 06/29/2023   HGB 10.1 (L) 06/29/2023   HCT 30.5 (L) 06/29/2023   MCV 97.8 06/29/2023   PLT 242 06/29/2023      Chemistry      Component Value Date/Time   NA 143  06/29/2023 1204   NA 139 09/22/2021 0954   K 3.8 06/29/2023 1204   CL 109 06/29/2023 1204   CO2 25 06/29/2023 1204   BUN 12 06/29/2023 1204   BUN 13 09/22/2021 0954   CREATININE 0.89 06/29/2023 1204   CREATININE 0.67 04/23/2015 1645      Component Value Date/Time   CALCIUM 9.2 06/29/2023 1204   ALKPHOS 80 06/29/2023 1204   AST 16 06/29/2023 1204   ALT 14 06/29/2023 1204   BILITOT 0.5 06/29/2023 1204

## 2023-06-29 NOTE — Progress Notes (Unsigned)
Critical glucose called to Tilford Pillar, RN at 1326 06/29/23 by Margaretha Glassing

## 2023-06-29 NOTE — Progress Notes (Signed)
CRITICAL VALUE STICKER  CRITICAL VALUE: Glucose 35  RECEIVER (on-site recipient of call): Tilford Pillar, RN  DATE & TIME NOTIFIED: 06/29/23 at 1323  MESSENGER (representative from lab): M. Abdulhalim  MD NOTIFIED: Dr. Bertis Ruddy  TIME OF NOTIFICATION:1328 06/29/23  RESPONSE:  [name] denies symptoms, given snack.

## 2023-06-29 NOTE — Assessment & Plan Note (Signed)
She is doing very well and is losing weight I congratulated her effort

## 2023-06-29 NOTE — Assessment & Plan Note (Signed)
She has severe vitamin B12 deficiency She will continue B12 injection monthly I plan to recheck it again in a few months

## 2023-07-04 ENCOUNTER — Inpatient Hospital Stay: Payer: Medicaid Other

## 2023-08-01 ENCOUNTER — Inpatient Hospital Stay: Payer: Medicaid Other | Attending: Gynecologic Oncology

## 2023-08-01 DIAGNOSIS — E538 Deficiency of other specified B group vitamins: Secondary | ICD-10-CM | POA: Diagnosis present

## 2023-08-01 DIAGNOSIS — C541 Malignant neoplasm of endometrium: Secondary | ICD-10-CM

## 2023-08-01 MED ORDER — CYANOCOBALAMIN 1000 MCG/ML IJ SOLN
1000.0000 ug | Freq: Once | INTRAMUSCULAR | Status: AC
  Administered 2023-08-01: 1000 ug via INTRAMUSCULAR
  Filled 2023-08-01: qty 1

## 2023-08-11 ENCOUNTER — Other Ambulatory Visit: Payer: Self-pay | Admitting: Internal Medicine

## 2023-08-11 DIAGNOSIS — Z794 Long term (current) use of insulin: Secondary | ICD-10-CM

## 2023-08-16 ENCOUNTER — Ambulatory Visit (INDEPENDENT_AMBULATORY_CARE_PROVIDER_SITE_OTHER): Payer: Medicaid Other | Admitting: Podiatry

## 2023-08-16 DIAGNOSIS — L97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 exposed: Secondary | ICD-10-CM

## 2023-08-16 DIAGNOSIS — E08621 Diabetes mellitus due to underlying condition with foot ulcer: Secondary | ICD-10-CM

## 2023-08-16 NOTE — Progress Notes (Signed)
Subjective:  [name], female    DOB: Apr 24, 1961,   MRN: [id number]  Chief Complaint  Patient presents with   Diabetic Ulcer    Right hallux    62 y.o. female presents for concern of new ulceration noted to her right great toe. It started several weeks ago. Relates she has been dressing with gentamicin and on an anabiotic from PCP. Relates she is overall doing well Relates burning and tingling in their feet. Patient is diabetic and last A1c was  Lab Results  Component Value Date   HGBA1C 6.1 (A) 12/23/2022   .   PCP:  Pcp, No    . Denies any other pedal complaints. Denies n/v/f/c.   Past Medical History:  Diagnosis Date   Anemia    Asthma 11/01/2010   only when respiratory infections   BPPV (benign paroxysmal positional vertigo) 03/07/2013   Broken teeth 11/02/2016   Callus of toe 07/07/2022   Colon cancer screening 05/05/2020   Diabetes mellitus    since 1983   Dizziness 09/22/2021   Elevated TSH 05/13/2013   Endometrial cancer (HCC)    Gangrene of toe of right foot (HCC) 04/14/2021   GERD (gastroesophageal reflux disease)    Hand pain, right 03/14/2019   History of radiation therapy    Vagina- 06/13/22-07/11/22-Dr. 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   Qualifier: Diagnosis of  By: Daphine Deutscher FNP, [place]     Osteomyelitis [facility])    Pneumonia    as a child   Wrist pain 04/11/2013    Objective:  Physical Exam: Vascular: DP/PT pulses 2/4 bilateral. CFT <3 seconds. Normal hair growth on digits. No edema.  Skin. No lacerations or abrasions bilateral feet. Ulceration noted to distal right hallux with granular base. Measurements belwo. No probe to bone. No erythema edema or purulence noted.  Musculoskeletal: MMT 5/5 bilateral lower extremities in DF, PF, Inversion and Eversion. Deceased ROM in DF of ankle joint. 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   Assessment:   1. 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 Ulcer right hallux with fat layer exposed  -Debridement as below. -Dressed with betadine , DSD. -Off-loading with surgical shoe. Disepnsed  -No abx indicated.  -Discussed glucose control and proper protein-rich diet.  -Discussed if any worsening redness, pain, fever or chills to call or may need to report to the emergency room. Patient expressed understanding.   Procedure: Excisional Debridement of Wound Rationale: Removal of non-viable soft tissue from the wound to promote healing.  Anesthesia: none Pre-Debridement Wound Measurements: Ovelrying callus  Post-Debridement Wound Measurements: 0.5 cm x 0.5 cm x 0.2 cm  Type of Debridement: Sharp Excisional Tissue Removed: Non-viable soft tissue Depth of Debridement: subcutaneous tissue. Technique: Sharp excisional debridement to bleeding, viable wound base.  Dressing: Dry, sterile, compression dressing. Disposition: Patient tolerated procedure well. Patient to return in 2 week for follow-up.  Return in about 2 weeks (around 08/30/2023) for wound check.   Louann Sjogren, DPM

## 2023-08-30 ENCOUNTER — Ambulatory Visit: Payer: Medicaid Other | Admitting: Podiatry

## 2023-08-30 ENCOUNTER — Encounter: Payer: Self-pay | Admitting: Podiatry

## 2023-08-30 DIAGNOSIS — E08621 Diabetes mellitus due to underlying condition with foot ulcer: Secondary | ICD-10-CM

## 2023-08-30 DIAGNOSIS — L97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 exposed: Secondary | ICD-10-CM | POA: Diagnosis not present

## 2023-08-30 NOTE — Progress Notes (Signed)
Subjective:  [name], female    DOB: 1961/03/02,   MRN: [id number]  No chief complaint on file.   62 y.o. female presents for follow-up of right hallux wound. Relates doing well has been dressing as instructed. Unable to wear boot as she almost fell a couple times.  Relates she is overall doing well Relates burning and tingling in their feet. Patient is diabetic and last A1c was  Lab Results  Component Value Date   HGBA1C 6.1 (A) 12/23/2022   .   PCP:  Pcp, No    . Denies any other pedal complaints. Denies n/v/f/c.   Past Medical History:  Diagnosis Date   Anemia    Asthma 11/01/2010   only when respiratory infections   BPPV (benign paroxysmal positional vertigo) 03/07/2013   Broken teeth 11/02/2016   Callus of toe 07/07/2022   Colon cancer screening 05/05/2020   Diabetes mellitus    since 1983   Dizziness 09/22/2021   Elevated TSH 05/13/2013   Endometrial cancer (HCC)    Gangrene of toe of right foot (HCC) 04/14/2021   GERD (gastroesophageal reflux disease)    Hand pain, right 03/14/2019   History of radiation therapy    Vagina- 06/13/22-07/11/22-Dr. 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 CFT <3 seconds. Normal hair growth on digits. No edema.  Skin. No lacerations or abrasions bilateral feet. Ulceration noted to distal right hallux with granular base. Measurements belwo. No probe to bone. No erythema edema or purulence noted.  Musculoskeletal: MMT 5/5 bilateral lower extremities in DF, PF, Inversion and Eversion. Deceased ROM in DF of ankle joint. 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    Assessment:   1. 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 Ulcer right hallux with fat layer exposed  -Debridement as below. -Dressed with betadine , DSD. -Off-loading with surgical shoe. Patient unable to wear and discuss other ways of offloading.  -No abx indicated.  -Discussed glucose control and proper protein-rich diet.  -Discussed if any worsening redness, pain, fever or chills to call or may need to report to the emergency room. Patient expressed understanding.   Procedure: Excisional Debridement of Wound Rationale: Removal of non-viable soft tissue from the wound to promote healing.  Anesthesia: none Pre-Debridement Wound Measurements: Ovelrying callus  Post-Debridement Wound Measurements: 0.3 cm x 0.5 cm x 0.2 cm  Type of Debridement: Sharp Excisional Tissue Removed: Non-viable soft tissue Depth of Debridement: subcutaneous tissue. Technique: Sharp excisional debridement to bleeding, viable wound base.  Dressing: Dry, sterile, compression dressing. Disposition: Patient tolerated procedure well. Patient to return in 2 week for follow-up.  Return in about 2 weeks (around 09/13/2023) for wound check.   Louann Sjogren, DPM

## 2023-08-31 ENCOUNTER — Inpatient Hospital Stay: Payer: Medicaid Other

## 2023-08-31 ENCOUNTER — Inpatient Hospital Stay: Payer: Medicaid Other | Attending: Gynecologic Oncology

## 2023-08-31 DIAGNOSIS — E538 Deficiency of other specified B group vitamins: Secondary | ICD-10-CM | POA: Diagnosis present

## 2023-08-31 DIAGNOSIS — C541 Malignant neoplasm of endometrium: Secondary | ICD-10-CM | POA: Diagnosis present

## 2023-08-31 DIAGNOSIS — E669 Obesity, unspecified: Secondary | ICD-10-CM | POA: Insufficient documentation

## 2023-08-31 DIAGNOSIS — Z79899 Other long term (current) drug therapy: Secondary | ICD-10-CM | POA: Diagnosis not present

## 2023-08-31 LAB — CBC WITH DIFFERENTIAL/PLATELET
Abs Immature Granulocytes: 0 10*3/uL (ref 0.00–0.07)
Basophils Absolute: 0 10*3/uL (ref 0.0–0.1)
Basophils Relative: 0 %
Eosinophils Absolute: 0.1 10*3/uL (ref 0.0–0.5)
Eosinophils Relative: 2 %
HCT: 32.5 % — ABNORMAL LOW (ref 36.0–46.0)
Hemoglobin: 10.4 g/dL — ABNORMAL LOW (ref 12.0–15.0)
Immature Granulocytes: 0 %
Lymphocytes Relative: 51 %
Lymphs Abs: 2.8 10*3/uL (ref 0.7–4.0)
MCH: 32.2 pg (ref 26.0–34.0)
MCHC: 32 g/dL (ref 30.0–36.0)
MCV: 100.6 fL — ABNORMAL HIGH (ref 80.0–100.0)
Monocytes Absolute: 0.3 10*3/uL (ref 0.1–1.0)
Monocytes Relative: 5 %
Neutro Abs: 2.3 10*3/uL (ref 1.7–7.7)
Neutrophils Relative %: 42 %
Platelets: 207 10*3/uL (ref 150–400)
RBC: 3.23 MIL/uL — ABNORMAL LOW (ref 3.87–5.11)
RDW: 13.1 % (ref 11.5–15.5)
WBC: 5.4 10*3/uL (ref 4.0–10.5)
nRBC: 0 % (ref 0.0–0.2)

## 2023-08-31 LAB — COMPREHENSIVE METABOLIC PANEL
ALT: 10 U/L (ref 0–44)
AST: 14 U/L — ABNORMAL LOW (ref 15–41)
Albumin: 4.1 g/dL (ref 3.5–5.0)
Alkaline Phosphatase: 81 U/L (ref 38–126)
Anion gap: 6 (ref 5–15)
BUN: 15 mg/dL (ref 8–23)
CO2: 27 mmol/L (ref 22–32)
Calcium: 9.2 mg/dL (ref 8.9–10.3)
Chloride: 108 mmol/L (ref 98–111)
Creatinine, Ser: 0.85 mg/dL (ref 0.44–1.00)
GFR, Estimated: >60 mL/min (ref >60)
Glucose, Bld: 103 mg/dL — ABNORMAL HIGH (ref 70–99)
Potassium: 4.2 mmol/L (ref 3.5–5.1)
Sodium: 141 mmol/L (ref 135–145)
Total Bilirubin: 0.4 mg/dL (ref 0.3–1.2)
Total Protein: 6.9 g/dL (ref 6.5–8.1)

## 2023-08-31 LAB — VITAMIN B12: Vitamin B-12: 235 pg/mL (ref 180–914)

## 2023-08-31 MED ORDER — CYANOCOBALAMIN 1000 MCG/ML IJ SOLN
1000.0000 ug | Freq: Once | INTRAMUSCULAR | Status: AC
  Administered 2023-08-31: 1000 ug via INTRAMUSCULAR

## 2023-08-31 MED ORDER — SODIUM CHLORIDE 0.9% FLUSH
10.0000 mL | Freq: Once | INTRAVENOUS | Status: AC
  Administered 2023-08-31: 10 mL

## 2023-08-31 MED ORDER — HEPARIN SOD (PORK) LOCK FLUSH 100 UNIT/ML IV SOLN
500.0000 [IU] | Freq: Once | INTRAVENOUS | Status: AC
  Administered 2023-08-31: 500 [IU]

## 2023-09-13 ENCOUNTER — Ambulatory Visit: Payer: Medicaid Other | Admitting: Podiatry

## 2023-09-13 DIAGNOSIS — E08621 Diabetes mellitus due to underlying condition with foot ulcer: Secondary | ICD-10-CM | POA: Diagnosis not present

## 2023-09-13 DIAGNOSIS — L97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 exposed: Secondary | ICD-10-CM | POA: Diagnosis not present

## 2023-09-13 NOTE — Progress Notes (Signed)
Subjective:  [name], female    DOB: Feb 25, 1961,   MRN: [id number]  No chief complaint on file.   62 y.o. female presents for follow-up of right hallux wound. Relates doing well has been dressing as instructed.  Relates she is overall doing well Relates burning and tingling in their feet. Patient is diabetic and last A1c was  Lab Results  Component Value Date   HGBA1C 6.1 (A) 12/23/2022   .   PCP:  Pcp, No    . Denies any other pedal complaints. Denies n/v/f/c.   Past Medical History:  Diagnosis Date   Anemia    Asthma 11/01/2010   only when respiratory infections   BPPV (benign paroxysmal positional vertigo) 03/07/2013   Broken teeth 11/02/2016   Callus of toe 07/07/2022   Colon cancer screening 05/05/2020   Diabetes mellitus    since 1983   Dizziness 09/22/2021   Elevated TSH 05/13/2013   Endometrial cancer (HCC)    Gangrene of toe of right foot (HCC) 04/14/2021   GERD (gastroesophageal reflux disease)    Hand pain, right 03/14/2019   History of radiation therapy    Vagina- 06/13/22-07/11/22-Dr. 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 CFT <3 seconds. Normal hair growth on digits. No edema.  Skin. No lacerations or abrasions bilateral feet. Ulceration noted to distal right hallux with granular base. Measurements belwo. No probe to bone. No erythema edema or purulence noted.  Musculoskeletal: MMT 5/5 bilateral lower extremities in DF, PF, Inversion and Eversion. Deceased ROM in DF of ankle joint. 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   Assessment:   1. Diabetic ulcer of toe of right foot  associated with diabetes mellitus due to underlying condition, with fat layer exposed (HCC)        Plan:  Patient was evaluated and treated and all questions answered. Ulcer right hallux with fat layer exposed  -Debridement as below. -Dressed with betadine , DSD. -Off-loading with surgical shoe. Patient unable to wear and discuss other ways of offloading. Discussed reducing walking as much as possible. -No abx indicated.  -Discussed glucose control and proper protein-rich diet.  -Discussed if any worsening redness, pain, fever or chills to call or may need to report to the emergency room. Patient expressed understanding.   Procedure: Excisional Debridement of Wound Rationale: Removal of non-viable soft tissue from the wound to promote healing.  Anesthesia: none Pre-Debridement Wound Measurements: Ovelrying callus  Post-Debridement Wound Measurements: 0.5 cm x 0.5 cm x 0.2 cm  Type of Debridement: Sharp Excisional Tissue Removed: Non-viable soft tissue Depth of Debridement: subcutaneous tissue. Technique: Sharp excisional debridement to bleeding, viable wound base.  Dressing: Dry, sterile, compression dressing. Disposition: Patient tolerated procedure well. Patient to return in 2 week for follow-up.  Return in about 2 weeks (around 09/27/2023) for wound check.   Louann Sjogren, DPM

## 2023-09-13 NOTE — Progress Notes (Unsigned)
Cardiology Office Note:  .   Date:  09/14/2023  ID:  Kristopher Glee, DOB 1961/05/02, MRN [id number] PCP: Porfirio Oar, PA  [facility] Providers Cardiologist:  None  History of Present Illness: .   Raihana A Galica is a 62 y.o. female with history of DM, HTN, HLD who presents for the evaluation of chest pain at the request of Vanetta Mulders, MD. Seen in ER in May 2024 for CP with negative work-up.   Discussed the use of AI scribe software for clinical note transcription with the patient, who gave verbal consent to proceed.  History of Present Illness   Ms. Brier, a patient with a history of diabetes, hypertension, and endometrial cancer (now in remission), was referred for a follow-up after an ER visit in May. She reported a sensation of heaviness in her chest, but could not recall the specifics of the chest pain she experienced during the ER visit. She has not had any similar episodes since then. She denies any limitations or trouble breathing. She has a family history of heart disease, with her father and paternal grandmother both having died from heart attacks. She is a non-smoker and does not consume alcohol. She is retired but continues to work in a non-strenuous job at [employer]. CT scan in April 2023 with no coronary calcium. EKG normal. Echo in 05/2023 normal. CV exam normal.           Problem List DM -A1c 6.1 HLD -T chol 96, HDL 50, LDL 29, TG 83 3. HTN 4. Obesity  5. Endometrial CA    ROS: All other ROS reviewed and negative. Pertinent positives noted in the HPI.     Studies Reviewed: Marland Kitchen   EKG Interpretation Date/Time:  Thursday September 14 2023 14:05:45 EDT Ventricular Rate:  99 PR Interval:  148 QRS Duration:  64 QT Interval:  330 QTC Calculation: 423 R Axis:   74  Text Interpretation: Normal sinus rhythm Normal ECG Confirmed by Lennie Odor [phone]) on 09/14/2023 2:09:38 PM   Physical Exam:   VS:  BP (!) 114/54 (BP Location: Left  Arm, Patient Position: Sitting, Cuff Size: Large)   Pulse 99   Ht 6\' 1"  (1.854 m)   Wt 242 lb (109.8 kg)   LMP 06/23/2014   SpO2 95%   BMI 31.93 kg/m    Wt Readings from Last 3 Encounters:  09/14/23 242 lb (109.8 kg)  06/29/23 242 lb 3.2 oz (109.9 kg)  04/18/23 248 lb 7.3 oz (112.7 kg)    GEN: Well nourished, well developed in no acute distress NECK: No JVD; No carotid bruits CARDIAC: RRR, no murmurs, rubs, gallops RESPIRATORY:  Clear to auscultation without rales, wheezing or rhonchi  ABDOMEN: Soft, non-tender, non-distended EXTREMITIES:  No edema; No deformity  ASSESSMENT AND PLAN: .   Assessment and Plan    Chest Pain Presented to the ER in May with chest pain, described as a heavy feeling. No recent episodes. EKG today is normal, CT chest in April 2023 showed no evidence of coronary calcium, and Echo in July 2023 was normal. Given the lack of symptoms, no further testing is needed at this time. -Return as needed if symptoms recur.  Hypertension Well controlled on current regimen. -Continue current medications.  Diabetes Well controlled with an A1c of 6.1. -Continue current medications.  Hyperlipidemia Lipids from 05/02/2023 show a total cholesterol of 96, HDL 50, LDL 29, and triglycerides 83, which are at goal for a diabetic patient. -Continue current  lipid-lowering medication.   Follow-up PRN.              Follow-up: Return if symptoms worsen or fail to improve.   Signed, Lenna Gilford. Flora Lipps, MD, [facility]  [facility]  [address] [place], [place] [address] [phone]  2:21 PM

## 2023-09-14 ENCOUNTER — Encounter: Payer: Self-pay | Admitting: Cardiovascular Disease

## 2023-09-14 ENCOUNTER — Ambulatory Visit: Payer: Medicaid Other | Attending: Cardiovascular Disease | Admitting: Cardiovascular Disease

## 2023-09-14 VITALS — BP 114/54 | HR 99 | Ht 73.0 in | Wt 242.0 lb

## 2023-09-14 DIAGNOSIS — E782 Mixed hyperlipidemia: Secondary | ICD-10-CM | POA: Diagnosis not present

## 2023-09-14 DIAGNOSIS — I150 Renovascular hypertension: Secondary | ICD-10-CM

## 2023-09-14 DIAGNOSIS — R072 Precordial pain: Secondary | ICD-10-CM | POA: Diagnosis not present

## 2023-09-14 NOTE — Patient Instructions (Signed)
Medication Instructions:  No changes    *If you need a refill on your cardiac medications before your next appointment, please call your pharmacy*   Lab Work: None    If you have labs (blood work) drawn today and your tests are completely normal, you will receive your results only by: MyChart Message (if you have MyChart) OR A paper copy in the mail If you have any lab test that is abnormal or we need to change your treatment, we will call you to review the results.   Testing/Procedures: None    Follow-Up: At [facility], you and your health needs are our priority.  As part of our continuing mission to provide you with exceptional heart care, we have created designated Provider Care Teams.  These Care Teams include your primary Cardiologist (physician) and Advanced Practice Providers (APPs -  Physician Assistants and Nurse Practitioners) who all work together to provide you with the care you need, when you need it.  We recommend signing up for the patient portal called "MyChart".  Sign up information is provided on this After Visit Summary.  MyChart is used to connect with patients for Virtual Visits (Telemedicine).  Patients are able to view lab/test results, encounter notes, upcoming appointments, etc.  Non-urgent messages can be sent to your provider as well.   To learn more about what you can do with MyChart, go to [email or web address].    Your next appointment:   Follow up as needed    Provider:   Dr. Velna Ochs, MD

## 2023-09-30 ENCOUNTER — Inpatient Hospital Stay (HOSPITAL_COMMUNITY): Payer: Medicaid Other

## 2023-09-30 ENCOUNTER — Emergency Department (HOSPITAL_COMMUNITY): Payer: Medicaid Other

## 2023-09-30 ENCOUNTER — Other Ambulatory Visit: Payer: Self-pay

## 2023-09-30 ENCOUNTER — Inpatient Hospital Stay (HOSPITAL_COMMUNITY)
Admission: EM | Admit: 2023-09-30 | Discharge: 2023-10-06 | DRG: 629 | Disposition: A | Discharge status: Home / Self Care | Payer: Medicaid Other | Attending: Emergency Medicine | Admitting: Emergency Medicine

## 2023-09-30 ENCOUNTER — Encounter (HOSPITAL_COMMUNITY): Payer: Self-pay

## 2023-09-30 DIAGNOSIS — E11621 Type 2 diabetes mellitus with foot ulcer: Secondary | ICD-10-CM | POA: Diagnosis present

## 2023-09-30 DIAGNOSIS — M869 Osteomyelitis, unspecified: Principal | ICD-10-CM | POA: Diagnosis present

## 2023-09-30 DIAGNOSIS — R600 Localized edema: Secondary | ICD-10-CM | POA: Diagnosis present

## 2023-09-30 DIAGNOSIS — Z7985 Long-term (current) use of injectable non-insulin antidiabetic drugs: Secondary | ICD-10-CM

## 2023-09-30 DIAGNOSIS — Z923 Personal history of irradiation: Secondary | ICD-10-CM

## 2023-09-30 DIAGNOSIS — K219 Gastro-esophageal reflux disease without esophagitis: Secondary | ICD-10-CM | POA: Diagnosis present

## 2023-09-30 DIAGNOSIS — Z79899 Other long term (current) drug therapy: Secondary | ICD-10-CM

## 2023-09-30 DIAGNOSIS — K644 Residual hemorrhoidal skin tags: Secondary | ICD-10-CM | POA: Diagnosis present

## 2023-09-30 DIAGNOSIS — Z87891 Personal history of nicotine dependence: Secondary | ICD-10-CM

## 2023-09-30 DIAGNOSIS — Z5941 Food insecurity: Secondary | ICD-10-CM

## 2023-09-30 DIAGNOSIS — Z8542 Personal history of malignant neoplasm of other parts of uterus: Secondary | ICD-10-CM

## 2023-09-30 DIAGNOSIS — M868X7 Other osteomyelitis, ankle and foot: Secondary | ICD-10-CM | POA: Diagnosis not present

## 2023-09-30 DIAGNOSIS — E538 Deficiency of other specified B group vitamins: Secondary | ICD-10-CM | POA: Diagnosis present

## 2023-09-30 DIAGNOSIS — E1165 Type 2 diabetes mellitus with hyperglycemia: Secondary | ICD-10-CM | POA: Diagnosis not present

## 2023-09-30 DIAGNOSIS — E669 Obesity, unspecified: Secondary | ICD-10-CM | POA: Diagnosis present

## 2023-09-30 DIAGNOSIS — Z794 Long term (current) use of insulin: Secondary | ICD-10-CM

## 2023-09-30 DIAGNOSIS — L97524 Non-pressure chronic ulcer of other part of left foot with necrosis of bone: Secondary | ICD-10-CM | POA: Diagnosis present

## 2023-09-30 DIAGNOSIS — D649 Anemia, unspecified: Secondary | ICD-10-CM | POA: Diagnosis present

## 2023-09-30 DIAGNOSIS — Z833 Family history of diabetes mellitus: Secondary | ICD-10-CM

## 2023-09-30 DIAGNOSIS — G4700 Insomnia, unspecified: Secondary | ICD-10-CM | POA: Diagnosis present

## 2023-09-30 DIAGNOSIS — M879 Osteonecrosis, unspecified: Secondary | ICD-10-CM | POA: Diagnosis present

## 2023-09-30 DIAGNOSIS — L97526 Non-pressure chronic ulcer of other part of left foot with bone involvement without evidence of necrosis: Secondary | ICD-10-CM | POA: Diagnosis not present

## 2023-09-30 DIAGNOSIS — Z89412 Acquired absence of left great toe: Secondary | ICD-10-CM

## 2023-09-30 DIAGNOSIS — M86171 Other acute osteomyelitis, right ankle and foot: Secondary | ICD-10-CM | POA: Diagnosis present

## 2023-09-30 DIAGNOSIS — E119 Type 2 diabetes mellitus without complications: Secondary | ICD-10-CM

## 2023-09-30 DIAGNOSIS — I10 Essential (primary) hypertension: Secondary | ICD-10-CM | POA: Diagnosis present

## 2023-09-30 DIAGNOSIS — E785 Hyperlipidemia, unspecified: Secondary | ICD-10-CM | POA: Diagnosis present

## 2023-09-30 DIAGNOSIS — Z5982 Transportation insecurity: Secondary | ICD-10-CM

## 2023-09-30 DIAGNOSIS — Z5986 Financial insecurity: Secondary | ICD-10-CM

## 2023-09-30 DIAGNOSIS — Z8249 Family history of ischemic heart disease and other diseases of the circulatory system: Secondary | ICD-10-CM

## 2023-09-30 DIAGNOSIS — Z6831 Body mass index (BMI) 31.0-31.9, adult: Secondary | ICD-10-CM

## 2023-09-30 DIAGNOSIS — Z90710 Acquired absence of both cervix and uterus: Secondary | ICD-10-CM

## 2023-09-30 DIAGNOSIS — E1169 Type 2 diabetes mellitus with other specified complication: Secondary | ICD-10-CM | POA: Diagnosis present

## 2023-09-30 DIAGNOSIS — J45909 Unspecified asthma, uncomplicated: Secondary | ICD-10-CM | POA: Diagnosis present

## 2023-09-30 DIAGNOSIS — Z7984 Long term (current) use of oral hypoglycemic drugs: Secondary | ICD-10-CM

## 2023-09-30 DIAGNOSIS — E1142 Type 2 diabetes mellitus with diabetic polyneuropathy: Secondary | ICD-10-CM | POA: Diagnosis not present

## 2023-09-30 DIAGNOSIS — R112 Nausea with vomiting, unspecified: Secondary | ICD-10-CM

## 2023-09-30 DIAGNOSIS — H8110 Benign paroxysmal vertigo, unspecified ear: Secondary | ICD-10-CM | POA: Diagnosis present

## 2023-09-30 DIAGNOSIS — Z89422 Acquired absence of other left toe(s): Secondary | ICD-10-CM

## 2023-09-30 LAB — CBG MONITORING, ED: Glucose-Capillary: 93 mg/dL (ref 70–99)

## 2023-09-30 LAB — COMPREHENSIVE METABOLIC PANEL
ALT: 14 U/L (ref 0–44)
AST: 17 U/L (ref 15–41)
Albumin: 3.2 g/dL — ABNORMAL LOW (ref 3.5–5.0)
Alkaline Phosphatase: 78 U/L (ref 38–126)
Anion gap: 8 (ref 5–15)
BUN: 13 mg/dL (ref 8–23)
CO2: 25 mmol/L (ref 22–32)
Calcium: 9.1 mg/dL (ref 8.9–10.3)
Chloride: 106 mmol/L (ref 98–111)
Creatinine, Ser: 0.93 mg/dL (ref 0.44–1.00)
GFR, Estimated: >60 mL/min (ref >60)
Glucose, Bld: 139 mg/dL — ABNORMAL HIGH (ref 70–99)
Potassium: 4.5 mmol/L (ref 3.5–5.1)
Sodium: 139 mmol/L (ref 135–145)
Total Bilirubin: 0.3 mg/dL (ref 0.3–1.2)
Total Protein: 6.7 g/dL (ref 6.5–8.1)

## 2023-09-30 LAB — CBC WITH DIFFERENTIAL/PLATELET
Abs Immature Granulocytes: 0.01 10*3/uL (ref 0.00–0.07)
Basophils Absolute: 0 10*3/uL (ref 0.0–0.1)
Basophils Relative: 1 %
Eosinophils Absolute: 0.1 10*3/uL (ref 0.0–0.5)
Eosinophils Relative: 1 %
HCT: 31.7 % — ABNORMAL LOW (ref 36.0–46.0)
Hemoglobin: 10.4 g/dL — ABNORMAL LOW (ref 12.0–15.0)
Immature Granulocytes: 0 %
Lymphocytes Relative: 40 %
Lymphs Abs: 2.2 10*3/uL (ref 0.7–4.0)
MCH: 32.7 pg (ref 26.0–34.0)
MCHC: 32.8 g/dL (ref 30.0–36.0)
MCV: 99.7 fL (ref 80.0–100.0)
Monocytes Absolute: 0.3 10*3/uL (ref 0.1–1.0)
Monocytes Relative: 6 %
Neutro Abs: 2.9 10*3/uL (ref 1.7–7.7)
Neutrophils Relative %: 52 %
Platelets: 215 10*3/uL (ref 150–400)
RBC: 3.18 MIL/uL — ABNORMAL LOW (ref 3.87–5.11)
RDW: 12.6 % (ref 11.5–15.5)
WBC: 5.5 10*3/uL (ref 4.0–10.5)
nRBC: 0 % (ref 0.0–0.2)

## 2023-09-30 LAB — GLUCOSE, CAPILLARY: Glucose-Capillary: 208 mg/dL — ABNORMAL HIGH (ref 70–99)

## 2023-09-30 LAB — I-STAT CG4 LACTIC ACID, ED: Lactic Acid, Venous: 1 mmol/L (ref 0.5–1.9)

## 2023-09-30 MED ORDER — GADOBUTROL 1 MMOL/ML IV SOLN
10.0000 mL | Freq: Once | INTRAVENOUS | Status: AC | PRN
Start: 2023-09-30 — End: 2023-09-30
  Administered 2023-09-30: 10 mL via INTRAVENOUS

## 2023-09-30 MED ORDER — INSULIN ASPART 100 UNIT/ML IJ SOLN
0.0000 [IU] | Freq: Three times a day (TID) | INTRAMUSCULAR | Status: DC
Start: 2023-10-01 — End: 2023-10-06
  Administered 2023-10-01: 3 [IU] via SUBCUTANEOUS
  Administered 2023-10-01 – 2023-10-04 (×4): 5 [IU] via SUBCUTANEOUS
  Administered 2023-10-04: 11 [IU] via SUBCUTANEOUS
  Administered 2023-10-04: 3 [IU] via SUBCUTANEOUS
  Administered 2023-10-05: 5 [IU] via SUBCUTANEOUS
  Administered 2023-10-05: 2 [IU] via SUBCUTANEOUS
  Administered 2023-10-05: 3 [IU] via SUBCUTANEOUS
  Administered 2023-10-06: 2 [IU] via SUBCUTANEOUS

## 2023-09-30 MED ORDER — INSULIN GLARGINE-YFGN 100 UNIT/ML ~~LOC~~ SOLN
30.0000 [IU] | Freq: Every day | SUBCUTANEOUS | Status: DC
  Administered 2023-09-30: 30 [IU] via SUBCUTANEOUS
  Filled 2023-09-30 (×2): qty 0.3

## 2023-09-30 MED ORDER — LISINOPRIL 20 MG PO TABS
20.0000 mg | ORAL_TABLET | Freq: Every day | ORAL | Status: DC
  Administered 2023-10-01: 20 mg via ORAL
  Filled 2023-09-30: qty 1

## 2023-09-30 MED ORDER — ZOLPIDEM TARTRATE 5 MG PO TABS
5.0000 mg | ORAL_TABLET | Freq: Every evening | ORAL | Status: DC | PRN
  Administered 2023-09-30 – 2023-10-05 (×6): 5 mg via ORAL
  Filled 2023-09-30 (×6): qty 1

## 2023-09-30 MED ORDER — ENOXAPARIN SODIUM 40 MG/0.4ML IJ SOSY
40.0000 mg | PREFILLED_SYRINGE | INTRAMUSCULAR | Status: DC
  Administered 2023-10-01: 40 mg via SUBCUTANEOUS
  Filled 2023-09-30: qty 0.4

## 2023-09-30 MED ORDER — ACETAMINOPHEN 650 MG RE SUPP: 650.0000 mg | Freq: Four times a day (QID) | RECTAL | Status: DC | PRN

## 2023-09-30 MED ORDER — PREGABALIN 75 MG PO CAPS
75.0000 mg | ORAL_CAPSULE | Freq: Two times a day (BID) | ORAL | Status: DC
  Administered 2023-09-30 – 2023-10-06 (×12): 75 mg via ORAL
  Filled 2023-09-30 (×12): qty 1

## 2023-09-30 MED ORDER — CHLORHEXIDINE GLUCONATE CLOTH 2 % EX PADS
6.0000 | MEDICATED_PAD | Freq: Every day | CUTANEOUS | Status: DC
  Administered 2023-10-01 – 2023-10-06 (×6): 6 via TOPICAL

## 2023-09-30 MED ORDER — SODIUM CHLORIDE 0.9 % IV SOLN: 2.0000 g | INTRAVENOUS | Status: DC

## 2023-09-30 MED ORDER — VANCOMYCIN HCL IN DEXTROSE 1-5 GM/200ML-% IV SOLN
1000.0000 mg | Freq: Once | INTRAVENOUS | Status: DC
Start: 2023-09-30 — End: 2023-09-30

## 2023-09-30 MED ORDER — ACETAMINOPHEN 325 MG PO TABS
650.0000 mg | ORAL_TABLET | Freq: Four times a day (QID) | ORAL | Status: DC | PRN
  Administered 2023-10-03 – 2023-10-05 (×2): 650 mg via ORAL
  Filled 2023-09-30 (×3): qty 2

## 2023-09-30 MED ORDER — SODIUM CHLORIDE 0.9% FLUSH
10.0000 mL | INTRAVENOUS | Status: DC | PRN
  Administered 2023-10-04: 10 mL

## 2023-09-30 MED ORDER — ATORVASTATIN CALCIUM 80 MG PO TABS
80.0000 mg | ORAL_TABLET | Freq: Every day | ORAL | Status: DC
  Administered 2023-10-01 – 2023-10-06 (×6): 80 mg via ORAL
  Filled 2023-09-30 (×6): qty 1

## 2023-09-30 MED ORDER — VANCOMYCIN HCL 1750 MG/350ML IV SOLN
1750.0000 mg | Freq: Once | INTRAVENOUS | Status: AC
  Administered 2023-09-30: 1750 mg via INTRAVENOUS
  Filled 2023-09-30: qty 350

## 2023-09-30 MED ORDER — INSULIN GLARGINE-YFGN 100 UNIT/ML ~~LOC~~ SOLN
30.0000 [IU] | Freq: Every day | SUBCUTANEOUS | Status: DC
  Filled 2023-09-30: qty 0.3

## 2023-09-30 MED ORDER — VANCOMYCIN HCL IN DEXTROSE 1-5 GM/200ML-% IV SOLN
1000.0000 mg | Freq: Two times a day (BID) | INTRAVENOUS | Status: DC
  Administered 2023-10-01: 1000 mg via INTRAVENOUS
  Filled 2023-09-30: qty 200

## 2023-09-30 NOTE — Plan of Care (Signed)
FMTS Brief Progress Note  S: Evaluated patient at bedside.  Patient ambulating to chair, without issues.  She is eating dinner.  She has no acute concerns.   O: BP 111/60   Pulse 82   Temp 99 F (37.2 C) (Oral)   Resp 16   Ht 6\' 1"  (1.854 m)   Wt 109.8 kg   LMP 06/23/2014   SpO2 99%   BMI 31.93 kg/m    General: NAD, pleasant, sitting in chair eating Cardio: RRR, no MRG. Cap Refill <2s. Respiratory: CTAB, normal wob on RA GI: Abdomen is soft, not tender, not distended. BS present  A/P: Osteomyelitis MRI completed tonight, confirms acute osteomyelitis of right great toe distal phalanx.  Additionally has acute osteomyelitis of the proximal and distal phalanx of the fifth toe. - Podiatry consulted, recommendations appreciated - Continue ceftriaxone vancomycin - Monitor fever curve - A.m. labs  Type 2 diabetes - CBGs with meals and at night - Follow-up A1c - Lantus 30 units daily - Monitor CIWA scale - Recommend tight control CBGs due to infection  - Orders reviewed. Labs for AM ordered, which was adjusted as needed.  - If condition changes, please notify Medicine teaching service.   Tiffany Kocher, DO 09/30/2023, 7:43 PM PGY-2, [facility] Night Resident  Please page [phone] with questions.

## 2023-09-30 NOTE — Progress Notes (Signed)
Pharmacy Antibiotic Note  [name] is a 62 y.o. female admitted on 09/30/2023 presenting with foot pain, hx of osteomyelitis.  Pharmacy has been consulted for vancomycin dosing.  Vancomycin 1750 mg IV x 1 given in ED. Gram negative coverage per MD  Plan: Vancomycin 1g IV q 12h (eAUC 542) Monitor renal function, Cx and clinical progression to narrow Vancomycin levels as indicated   Height: 6\' 1"  (185.4 cm) Weight: 109.8 kg (242 lb) IBW/kg (Calculated) : 75.4  Temp (24hrs), Avg:99 F (37.2 C), Min:99 F (37.2 C), Max:99 F (37.2 C)  Recent Labs  Lab 09/30/23 1542 09/30/23 1552  WBC 5.5  --   CREATININE 0.93  --   LATICACIDVEN  --  1.0    Estimated Creatinine Clearance: 89.5 mL/min (by C-G formula based on SCr of 0.93 mg/dL).    Allergies  Allergen Reactions   Nsaids Nausea And Vomiting    Acid reflux   Penicillins Other (See Comments)    Unknown Did it involve swelling of the face/tongue/throat, SOB, or low BP? Unk Did it involve sudden or severe rash/hives, skin peeling, or any reaction on the inside of your mouth or nose? Unk  Did you need to seek medical attention at a hospital or doctor's office? Unk When did it last happen? Childhood      If all above answers are "NO", may proceed with cephalosporin use.     Daylene Posey, PharmD, [facility] Clinical Pharmacist ED Pharmacist Phone # [phone] 09/30/2023 6:29 PM

## 2023-09-30 NOTE — ED Provider Notes (Signed)
  Physical Exam  BP (!) 112/51   Pulse 83   Temp 99 F (37.2 C) (Oral)   Resp 19   Ht 6\' 1"  (1.854 m)   Wt 109.8 kg   LMP 06/23/2014   SpO2 100%   BMI 31.93 kg/m   Physical Exam  Procedures  Procedures  ED Course / MDM    Medical Decision Making Care assumed at 3 PM.  Patient has history of osteomyelitis and here with foot pain.  X-ray showed osteomyelitis.  Previous provider discussed with patient's podiatrist, Dr. Ralene Cork.  She states that she will consult on the patient.  Patient also received broad-spectrum antibiotics.  Signout pending labs and admission.  4:45 PM Labs showed normal white blood cell count. Lactate is normal.  Received IV vancomycin and patient will be admitted by internal medicine teaching service   Amount and/or Complexity of Data Reviewed Labs: ordered. Radiology: ordered.  Risk OTC drugs. Prescription drug management.          Charlynne Pander, MD 09/30/23 [phone]

## 2023-09-30 NOTE — ED Notes (Signed)
Patient refusing blood until port is accessed

## 2023-09-30 NOTE — ED Notes (Signed)
Pt came out the room and started yelling at the nursing staff stated " if you all can not get me a room at 7, then I am leaving. You all locked me up in a room with a  place to charge my phone . I am pissed off , so I am  going to leave. " Pt also told the staff to step off  and refused to get hooked up to the monitor.

## 2023-09-30 NOTE — ED Provider Notes (Signed)
[place] EMERGENCY DEPARTMENT AT [facility] Provider Note   CSN: [id number] Arrival date & time: 09/30/23  1201     History  Chief Complaint  Patient presents with   Wound Infection    [name] is a 62 y.o. female.  The history is provided by medical records and the patient. No language interpreter was used.  Toe Pain This is a new problem. The current episode started more than 2 days ago. The problem occurs constantly. The problem has been gradually worsening. Pertinent negatives include no chest pain, no abdominal pain, no headaches and no shortness of breath. Nothing aggravates the symptoms. Nothing relieves the symptoms. She has tried nothing for the symptoms. The treatment provided no relief.       Home Medications Prior to Admission medications   Medication Sig Start Date End Date Taking? Authorizing Provider  Accu-Chek Softclix Lancets lancets CHECK BLOOD SUGAR 4 TIMES A DAY BEFORE MEALS AND BEDTIME 09/03/22   Long, Arlyss Repress, MD  acetaminophen (TYLENOL) 650 MG CR tablet Take 1,300-1,950 mg by mouth every 8 (eight) hours as needed for pain.    [provider]  atorvastatin (LIPITOR) 80 MG tablet Take 1 tablet (80 mg total) by mouth daily. 09/23/21   Steffanie Rainwater, MD  Blood Glucose Monitoring Suppl (ACCU-CHEK GUIDE) w/Device KIT CHECK BLOOD SUGAR 4 TIMES A DAY BEFORE MEALS AND BEDTIME 09/03/22   Long, Arlyss Repress, MD  Blood Pressure Monitoring (BLOOD PRESSURE KIT) DEVI Use to check blood pressure daily 07/05/22   Steffanie Rainwater, MD  collagenase (SANTYL) 250 UNIT/GM ointment Apply 1 Application topically daily. Apply 1g to R 5th toe daily. Total 1g per day. Patient not taking: Reported on 09/14/2023 12/26/22   Mercie Eon, MD  cyanocobalamin (VITAMIN B12) 1000 MCG/ML injection Inject 1 mL (1,000 mcg total) into the muscle every 30 (thirty) days. 10/27/22   Artis Delay, MD  glucose blood (ACCU-CHEK GUIDE) test strip Check blood sugar 4 times  per day 08/31/22   Multani, Bhupinder, MD  insulin glargine (LANTUS SOLOSTAR) 100 UNIT/ML Solostar Pen Inject 30 Units into the skin at bedtime. 06/25/22 09/23/22  Steffanie Rainwater, MD  insulin lispro (HUMALOG) 100 UNIT/ML injection Inject 0.07 mLs (7 Units total) into the skin 3 (three) times daily with meals. 12/26/22   Mercie Eon, MD  Insulin Syringe-Needle U-100 31G X 15/64" 0.5 ML MISC 1 Syringe by Does not apply route 3 (three) times daily. 08/26/22   Masters, Katie, DO  lisinopril (ZESTRIL) 20 MG tablet Take 1 tablet (20 mg total) by mouth daily. 12/26/22 12/26/23  Mercie Eon, MD  meclizine (ANTIVERT) 25 MG tablet Take 1 tablet (25 mg total) by mouth 2 (two) times daily as needed for dizziness. 12/26/22   Mercie Eon, MD  metFORMIN (GLUCOPHAGE-XR) 500 MG 24 hr tablet Take 4 tablets (2,000 mg total) by mouth at bedtime. 12/26/22 12/26/23  Mercie Eon, MD  Multiple Vitamin (MULTIVITAMIN WITH MINERALS) TABS tablet Take 1 tablet by mouth every evening.     [provider]  Nutritional Supplements (ENSURE ACTIVE HEART HEALTH) LIQD Take 1 bottle twice daily between meals Patient not taking: Reported on 09/14/2023 07/05/22   Steffanie Rainwater, MD  omeprazole (PRILOSEC) 20 MG capsule Take 1 capsule (20 mg total) by mouth daily. Patient taking differently: Take 20 mg by mouth daily as needed (acid reflux). 05/04/20   Angelita Ingles, MD  pregabalin (LYRICA) 75 MG capsule Take 1 capsule (75 mg total) by  mouth 2 (two) times daily. 10/03/22 10/03/23  Masters, Katie, DO  Semaglutide, 2 MG/DOSE, (OZEMPIC, 2 MG/DOSE,) 8 MG/3ML SOPN Inject 2 mg into the skin once a week. 12/23/22   Adron Bene, MD  zolpidem (AMBIEN) 5 MG tablet Take 1 tablet (5 mg total) by mouth at bedtime as needed for sleep. 12/29/22   Tyson Alias, MD      Allergies    Nsaids and Penicillins    Review of Systems   Review of Systems  Constitutional:  Negative for chills, fatigue and fever.  HENT:  Negative  for congestion.   Respiratory:  Negative for chest tightness and shortness of breath.   Cardiovascular:  Negative for chest pain.  Gastrointestinal:  Negative for abdominal pain, constipation, diarrhea, nausea and vomiting.  Musculoskeletal:  Negative for back pain and neck pain.  Skin:  Positive for wound.  Neurological:  Negative for headaches.  Psychiatric/Behavioral:  Negative for agitation.   All other systems reviewed and are negative.   Physical Exam Updated Vital Signs BP 115/61 (BP Location: Right Arm)   Pulse 90   Temp 99 F (37.2 C) (Oral)   Resp 18   Ht 6\' 1"  (1.854 m)   Wt 109.8 kg   LMP 06/23/2014   SpO2 97%   BMI 31.93 kg/m  Physical Exam Vitals and nursing note reviewed.  Constitutional:      General: She is not in acute distress.    Appearance: She is well-developed. She is not ill-appearing, toxic-appearing or diaphoretic.  HENT:     Head: Normocephalic and atraumatic.  Eyes:     Conjunctiva/sclera: Conjunctivae normal.  Cardiovascular:     Rate and Rhythm: Normal rate and regular rhythm.     Heart sounds: No murmur heard. Pulmonary:     Effort: Pulmonary effort is normal. No respiratory distress.     Breath sounds: Normal breath sounds.  Abdominal:     Palpations: Abdomen is soft.     Tenderness: There is no abdominal tenderness. There is no guarding or rebound.  Musculoskeletal:        General: Swelling and tenderness present.     Cervical back: Neck supple.  Skin:    General: Skin is warm and dry.     Capillary Refill: Capillary refill takes less than 2 seconds.     Findings: Erythema present.  Neurological:     General: No focal deficit present.     Mental Status: She is alert.     Sensory: No sensory deficit.     Motor: No weakness.  Psychiatric:        Mood and Affect: Mood normal.     ED Results / Procedures / Treatments   Labs (all labs ordered are listed, but only abnormal results are displayed) Labs Reviewed  COMPREHENSIVE  METABOLIC PANEL  CBC WITH DIFFERENTIAL/PLATELET  CBG MONITORING, ED  I-STAT CG4 LACTIC ACID, ED    EKG None  Radiology DG Foot 2 Views Right  Result Date: 09/30/2023 CLINICAL DATA:  Great toe wound EXAM: RIGHT FOOT - 2 VIEW COMPARISON:  None Available. FINDINGS: No fracture or dislocation. Soft tissue swelling about the mid and forefoot. Well corticated plantar calcaneal spur. There is some progressive sclerosis of the distal tuft of the distal phalanx of the great toe. Question subtle distal tuft erosive change. IMPRESSION: Soft tissue swelling again seen with some sclerosis now seen of the distal aspect of the distal phalanx of the great toe with subtle erosive changes. Please correlate  for an area of osteomyelitis. Confirmatory workup with MRI or bone scan could be considered as clinically appropriate. Electronically Signed   By: Karen Kays M.D.   On: 09/30/2023 13:17    Procedures Procedures    Medications Ordered in ED Medications - No data to display  ED Course/ Medical Decision Making/ A&P                                 Medical Decision Making Amount and/or Complexity of Data Reviewed Labs: ordered. Radiology: ordered.  Risk OTC drugs. Prescription drug management.    Marcianne A Kimbley is a 62 y.o. female with a past medical history significant for diabetes, hypertension, asthma, hyperlipidemia, and previous left toe amputations for osteomyelitis who presents with right toe wound worsening.  According to patient, for the last few weeks she has had a wound on her right great toe and has been seen by podiatry.  She was given antibiotics and is post to be cleaning it but reports she is had some issues doing that.  She reports she has developed pain in the foot and toe over the last few days and now has morbid discharge and foul smell.  She is worried about osteomyelitis.  She was told to come in for evaluation and did not want to wait on Monday.  Otherwise she denies  fevers, chills, congestion, cough, nausea, vomiting, constipation, diarrhea, or urinary changes.  Denies pain in the knee or leg otherwise.  On exam, patient has a wound on the tip of the right great toe with some foul-smelling purulence.  There is some redness and tenderness and swelling going up the foot.  Intact pulse however.  Intact sensation and strength otherwise.  She reports mild neuropathies that are not different than baseline.  Otherwise lungs clear and chest nontender.  Patient had x-ray in triage that does show concern for osteomyelitis.  Message into podiatry who will see patient during admission but will give IV antibiotics get labs and admit for further management.  Patient says she does not want amputation again and would rather get medical management.  Will admit to medicine after labs have returned.  Care transferred to Dr. Archer Asa while awaiting results of labs then will call for admission with podiatry to follow during her admit.        Final Clinical Impression(s) / ED Diagnoses Final diagnoses:  Osteomyelitis of right foot, unspecified type (HCC)    Clinical Impression: 1. Osteomyelitis of right foot, unspecified type (HCC)     Disposition: Admit  This note was prepared with assistance of Dragon voice recognition software. Occasional wrong-word or sound-a-like substitutions may have occurred due to the inherent limitations of voice recognition software.      Aramis Weil, Canary Brim, MD 09/30/23 [phone]

## 2023-09-30 NOTE — ED Triage Notes (Signed)
Patient reports has had a wound on right great toe since sept.  Has been seeing a podiatrist and it was getting better but recently has got worse with purulent drainage and swelling to right foot.  Denies fevers. Patient reports she has not been checking her blood sugars bc she lost her meter.

## 2023-09-30 NOTE — ED Notes (Signed)
Was only able to obtain 1 set of culture bc pt will only let nursing staff access her port, refused to get stuck anywhere else. MD made aware.

## 2023-09-30 NOTE — ED Notes (Signed)
Admitting MD at the bedside.  

## 2023-09-30 NOTE — Plan of Care (Signed)
  Problem: Education: Goal: Ability to describe self-care measures that may prevent or decrease complications (Diabetes Survival Skills Education) will improve Outcome: Progressing Goal: Individualized Educational Video(s) Outcome: Progressing   

## 2023-09-30 NOTE — ED Notes (Signed)
ED TO INPATIENT HANDOFF REPORT  ED Nurse Name and Phone #: Dwana Melena [phone]  S Name/Age/Gender [name] 62 y.o. female Room/Bed: TRACC/TRACC  Code Status   Code Status: Full Code  Home/SNF/Other Home Patient oriented to: self, place, time, and situation Is this baseline? Yes   Triage Complete: Triage complete  Chief Complaint Diabetic foot ulcer (HCC) [A54.098, L97.509]  Triage Note Patient reports has had a wound on right great toe since sept.  Has been seeing a podiatrist and it was getting better but recently has got worse with purulent drainage and swelling to right foot.  Denies fevers. Patient reports she has not been checking her blood sugars bc she lost her meter.    Allergies Allergies  Allergen Reactions   Nsaids Nausea And Vomiting    Acid reflux   Penicillins Other (See Comments)    Unknown Did it involve swelling of the face/tongue/throat, SOB, or low BP? Unk Did it involve sudden or severe rash/hives, skin peeling, or any reaction on the inside of your mouth or nose? Unk  Did you need to seek medical attention at a hospital or doctor's office? Unk When did it last happen? Childhood      If all above answers are "NO", may proceed with cephalosporin use.     Level of Care/Admitting Diagnosis ED Disposition     ED Disposition  Admit   Condition  --   Comment  Hospital Area: [facility] [[id number]]  Level of Care: Med-Surg [[id number]]  May admit patient to [facility] or [facility] if equivalent level of care is available:: No  Covid Evaluation: Asymptomatic - no recent exposure (last 10 days) testing not required  Diagnosis: Diabetic foot ulcer [facility]) [[id number]]  Admitting Physician: Celine Mans [[id number]]  Attending Physician: Acquanetta Belling D [[id number]]  Certification:: I certify this patient will need inpatient services for at least 2 midnights  Expected Medical Readiness: 10/02/2023          B Medical/Surgery History Past  Medical History:  Diagnosis Date   Anemia    Asthma 11/01/2010   only when respiratory infections   BPPV (benign paroxysmal positional vertigo) 03/07/2013   Broken teeth 11/02/2016   Callus of toe 07/07/2022   Colon cancer screening 05/05/2020   Diabetes mellitus    since 1983   Dizziness 09/22/2021   Elevated TSH 05/13/2013   Endometrial cancer (HCC)    Gangrene of toe of right foot (HCC) 04/14/2021   GERD (gastroesophageal reflux disease)    Hand pain, right 03/14/2019   History of radiation therapy    Vagina- 06/13/22-07/11/22-Dr. 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   Qualifier: Diagnosis of  By: Daphine Deutscher FNP, [place]     Osteomyelitis [facility]    Pneumonia    as a child   Wrist pain 04/11/2013   Past Surgical History:  Procedure Laterality Date   ABDOMINAL HYSTERECTOMY     ACHILLES TENDON LENGTHENING Left 08/29/2014   Procedure: Left Achilles Lengthening;  Surgeon: Nadara Mustard, MD;  Location: [facility] OR;  Service: Orthopedics;  Laterality: Left;   AMPUTATION Left 02/19/2013   Procedure: Amputation of Left Great Toe;  Surgeon: Kathryne Hitch, MD;  Location: [facility] OR;  Service: Orthopedics;  Laterality: Left;   AMPUTATION Left 08/29/2014   Procedure: Left Foot 2nd and 1st Toe Amputation;  Surgeon:  Nadara Mustard, MD;  Location: [facility] OR;  Service: Orthopedics;  Laterality: Left;   CARDIAC CATHETERIZATION     15 years ago   CESAREAN SECTION     x 4   CYSTOSCOPY  03/30/2022   Procedure: CYSTOSCOPY;  Surgeon: Carver Fila, MD;  Location: [facility] ORS;  Service: Gynecology;;   IR IMAGING GUIDED PORT INSERTION  04/19/2022   ROBOTIC ASSISTED TOTAL HYSTERECTOMY WITH BILATERAL SALPINGO OOPHERECTOMY Bilateral 03/30/2022   Procedure: XI ROBOTIC ASSISTED TOTAL HYSTERECTOMY WITH BILATERAL SALPINGO-OOPHORECTOMY, MINI LAPAROTOMY;  Surgeon: Carver Fila, MD;  Location:  [facility] ORS;  Service: Gynecology;  Laterality: Bilateral;   SENTINEL NODE BIOPSY N/A 03/30/2022   Procedure: SENTINEL NODE BIOPSY;  Surgeon: Carver Fila, MD;  Location: [facility] ORS;  Service: Gynecology;  Laterality: N/A;   TUBAL LIGATION     at one of c-sections     A IV Location/Drains/Wounds Patient Lines/Drains/Airways Status     Active Line/Drains/Airways     Name Placement date Placement time Site Days   Implanted Port 04/19/22 Right Chest 04/19/22  1551  Chest  529   Wound / Incision (Open or Dehisced) 07/26/18 Diabetic ulcer Foot Left Small open area to ball of left foot 07/26/18  --  Foot  1892   Wound / Incision (Open or Dehisced) 06/30/21 Diabetic ulcer Toe (Comment  which one) Anterior;Right Big toe 06/30/21  1057  Toe (Comment  which one)  822   Wound / Incision (Open or Dehisced) 06/23/22 Diabetic ulcer Foot Left;Distal;Posterior 06/23/22  0315  Foot  464            Intake/Output Last 24 hours No intake or output data in the 24 hours ending 09/30/23 1830  Labs/Imaging Results for orders placed or performed during the hospital encounter of 09/30/23 (from the past 48 hour(s))  POC CBG, ED     Status: None   Collection Time: 09/30/23 12:38 PM  Result Value Ref Range   Glucose-Capillary 93 70 - 99 mg/dL    Comment: Glucose reference range applies only to samples taken after fasting for at least 8 hours.  Comprehensive metabolic panel     Status: Abnormal   Collection Time: 09/30/23  3:42 PM  Result Value Ref Range   Sodium 139 135 - 145 mmol/L   Potassium 4.5 3.5 - 5.1 mmol/L   Chloride 106 98 - 111 mmol/L   CO2 25 22 - 32 mmol/L   Glucose, Bld 139 (H) 70 - 99 mg/dL    Comment: Glucose reference range applies only to samples taken after fasting for at least 8 hours.   BUN 13 8 - 23 mg/dL   Creatinine, Ser 5.05 0.44 - 1.00 mg/dL   Calcium 9.1 8.9 - 39.7 mg/dL   Total Protein 6.7 6.5 - 8.1 g/dL   Albumin 3.2 (L) 3.5 - 5.0 g/dL   AST 17 15 - 41 U/L   ALT 14 0 -  44 U/L   Alkaline Phosphatase 78 38 - 126 U/L   Total Bilirubin 0.3 0.3 - 1.2 mg/dL   GFR, Estimated >67 >34 mL/min    Comment: (NOTE) Calculated using the CKD-EPI Creatinine Equation (2021)    Anion gap 8 5 - 15    Comment: Performed at [facility], [address] [place], [place] [address]  CBC with Differential     Status: Abnormal   Collection Time: 09/30/23  3:42 PM  Result Value Ref Range   WBC 5.5 4.0 - 10.5  K/uL   RBC 3.18 (L) 3.87 - 5.11 MIL/uL   Hemoglobin 10.4 (L) 12.0 - 15.0 g/dL   HCT 78.2 (L) 95.6 - 21.3 %   MCV 99.7 80.0 - 100.0 fL   MCH 32.7 26.0 - 34.0 pg   MCHC 32.8 30.0 - 36.0 g/dL   RDW 08.6 57.8 - 46.9 %   Platelets 215 150 - 400 K/uL   nRBC 0.0 0.0 - 0.2 %   Neutrophils Relative % 52 %   Neutro Abs 2.9 1.7 - 7.7 K/uL   Lymphocytes Relative 40 %   Lymphs Abs 2.2 0.7 - 4.0 K/uL   Monocytes Relative 6 %   Monocytes Absolute 0.3 0.1 - 1.0 K/uL   Eosinophils Relative 1 %   Eosinophils Absolute 0.1 0.0 - 0.5 K/uL   Basophils Relative 1 %   Basophils Absolute 0.0 0.0 - 0.1 K/uL   Immature Granulocytes 0 %   Abs Immature Granulocytes 0.01 0.00 - 0.07 K/uL    Comment: Performed at [facility], [address], [place], [place] [address]  I-Stat Lactic Acid, ED     Status: None   Collection Time: 09/30/23  3:52 PM  Result Value Ref Range   Lactic Acid, Venous 1.0 0.5 - 1.9 mmol/L   DG Foot 2 Views Right  Result Date: 09/30/2023 CLINICAL DATA:  Great toe wound EXAM: RIGHT FOOT - 2 VIEW COMPARISON:  None Available. FINDINGS: No fracture or dislocation. Soft tissue swelling about the mid and forefoot. Well corticated plantar calcaneal spur. There is some progressive sclerosis of the distal tuft of the distal phalanx of the great toe. Question subtle distal tuft erosive change. IMPRESSION: Soft tissue swelling again seen with some sclerosis now seen of the distal aspect of the distal phalanx of the great toe with subtle erosive changes. Please correlate  for an area of osteomyelitis. Confirmatory workup with MRI or bone scan could be considered as clinically appropriate. Electronically Signed   By: Karen Kays M.D.   On: 09/30/2023 13:17    Pending Labs Unresulted Labs (From admission, onward)     Start     Ordered   10/01/23 0500  Hemoglobin A1c  Tomorrow morning,   R       Comments: To assess prior glycemic control    09/30/23 1826   10/01/23 0500  Basic metabolic panel  Tomorrow morning,   R        09/30/23 1826   10/01/23 0500  CBC  Tomorrow morning,   R        09/30/23 1826   09/30/23 1819  HIV Antibody (routine testing w rflx)  (HIV Antibody (Routine testing w reflex) panel)  Once,   R        09/30/23 1826   09/30/23 1503  Blood culture (routine x 2)  BLOOD CULTURE X 2,   R      09/30/23 1502            Vitals/Pain Today's Vitals   09/30/23 1556 09/30/23 1606 09/30/23 1645 09/30/23 1715  BP: (!) 112/59 (!) 112/51 113/62 122/66  Pulse:  83 83 84  Resp: 11 19 18 15   Temp:      TempSrc:      SpO2:  100% 100% 100%  Weight:      Height:      PainSc:        Isolation Precautions No active isolations  Medications Medications  sodium chloride flush (NS) 0.9 % injection  10-40 mL (has no administration in time range)  Chlorhexidine Gluconate Cloth 2 % PADS 6 each (has no administration in time range)  cefTRIAXone (ROCEPHIN) 2 g in sodium chloride 0.9 % 100 mL IVPB (has no administration in time range)  atorvastatin (LIPITOR) tablet 80 mg (has no administration in time range)  lisinopril (ZESTRIL) tablet 20 mg (has no administration in time range)  zolpidem (AMBIEN) tablet 5 mg (has no administration in time range)  insulin glargine-yfgn (SEMGLEE) injection 30 Units (has no administration in time range)  insulin aspart (novoLOG) injection 0-15 Units (has no administration in time range)  enoxaparin (LOVENOX) injection 40 mg (has no administration in time range)  acetaminophen (TYLENOL) tablet 650 mg (has no  administration in time range)    Or  acetaminophen (TYLENOL) suppository 650 mg (has no administration in time range)  vancomycin (VANCOREADY) IVPB 1750 mg/350 mL (0 mg Intravenous Stopped 09/30/23 1747)    Mobility walks     Focused Assessments    R Recommendations: See Admitting Provider Note  Report given to:   Additional Notes:

## 2023-09-30 NOTE — H&P (Signed)
Hospital Admission History and Physical Service Pager: [phone]  Patient name: [name] Medical record number: [id number] Date of Birth: 11/10/1961 Age: 62 y.o. Gender: female  Primary Care Provider: Porfirio Oar, PA Consultants: Podiatry Code Status: Full which was confirmed with family if patient unable to confirm   Preferred Emergency Contact:  Contact Information     Name Relation Home Work Mobile   Bostick,Jasmin Daughter [phone]  [phone]      Other Contacts     Name Relation Home Work [place] Daughter [phone]  [phone]   Raeford Razor [phone]  [phone]   [facility] Daughter [phone]  [phone]        Chief Complaint: Foot wound  Assessment and Plan: [name] is a 62 y.o. female presenting with chronic right great toe diabetic ulcer with concern for new infection.  Suspect that ulcer has progressed to osteomyelitis given purulent drainage and findings on imaging.  Minimal erythema on exam, doubt active cellulitis and necrotizing fasciitis is very unlikely.  Assessment & Plan Osteomyelitis of right foot, unspecified type (HCC) Suspect that patient will need amputation.  Reassuringly stable vital signs without signs of systemic infection.  May likely be chronic. -Admit to family medicine teaching service, MedSurg, attending Dr. McDiarmid -Empiric vancomycin and ceftriaxone -Vitals per floor -Monitor fever curve -Tylenol as needed for pain -AM CBC and BMP -Follow-up blood cultures -Podiatry consulted, recommendations appreciated -Follow-up MRI foot Type 2 diabetes mellitus with foot ulcer, with long-term current use of insulin (HCC) Last A1c 6.1 12/23/2022.  Home regimen includes 40 units of Lantus daily as well as sliding scale with meals.  She also takes metformin 1000 mg twice daily and Ozempic weekly. -CBGs with meals and at night -Follow-up A1c -Lantus 30 units  daily -Moderate sliding scale -Tightly control CBGs due to infection    Chronic and Stable Conditions: HLD - continue Lipitor 80 mg Hypertension - Continue lisinopril 20 mg Insomnia - continue Ambien 5 mg   FEN/GI: Modified that VTE Prophylaxis: Lovenox  Disposition: MedSurg  History of Present Illness:  [name] is a 62 y.o. female presenting with right great toe ulcer.  Patient reports today that she has had chronic wound in her right toe since September that she has been following with podiatry and wound care for.  It had been greatly improving until earlier this week.  When she states she noticed increased purulent discharge with some blood.  She also noticed increased swelling in her foot.  It is not painful.  She denies any fevers.  States she called her podiatrist who told her to go to the emergency department.  In the ED, podiatry recommended inpatient observation for concern for osteomyelitis and will see patient, per ED signout.  Patient was started on vancomycin.  Labs were overall reassuring.  X-rays of the right foot suspicious for osteomyelitis.  Review Of Systems: Per HPI  Pertinent Past Medical History: History of endometrial cancer status post hysterectomy GERD Hypertension Type 2 diabetes Peripheral neuropathy Remainder reviewed in history tab.   Pertinent Past Surgical History: Previous left toe amputation x 2 Remainder reviewed in history tab.   Pertinent Social History: Tobacco use: None Alcohol use: None Other Substance use: None Lives alone  Pertinent Family History:  Remainder reviewed in history tab.   Important Outpatient Medications: Lantus 40 units daily Lisinopril 20 mg daily Lipitor 80 mg Ambien 5 mg Metformin thousand twice daily Ozempic once weekly  Remainder reviewed in medication  history.   Objective: BP 111/60   Pulse 82   Temp 99 F (37.2 C) (Oral)   Resp 16   Ht 6\' 1"  (1.854 m)   Wt 109.8 kg   LMP 06/23/2014    SpO2 99%   BMI 31.93 kg/m  Exam: General: A&O, NAD, lying comfortably in hospital bed HEENT: No sign of trauma, EOM grossly intact, moist mucous membranes Cardiac: RRR, no m/r/g Respiratory: CTAB, normal WOB, no w/c/r GI: Soft, NTTP, non-distended, no rebound or guarding Extremities: NTTP, no peripheral edema, mild midfoot swelling, right great toe ulcer, purulent drainage    Neuro: Moves all four extremities appropriately. Psych: Appropriate mood and affect   Labs:  CBC BMET  Recent Labs  Lab 09/30/23 1542  WBC 5.5  HGB 10.4*  HCT 31.7*  PLT 215   Recent Labs  Lab 09/30/23 1542  NA 139  K 4.5  CL 106  CO2 25  BUN 13  CREATININE 0.93  GLUCOSE 139*  CALCIUM 9.1     Blood cultures pending Lactic acid 1.0  EKG: Pending   Imaging Studies Performed:  DG Foot 2 Views Right Soft tissue swelling again seen with some sclerosis now seen of the distal aspect of the distal phalanx of the great toe with subtle erosive changes. Please correlate for an area of osteomyelitis. Confirmatory workup with MRI or bone scan could be considered as clinically appropriate.   Celine Mans, MD 09/30/2023, 7:32 PM PGY-2, [facility]  FPTS Intern pager: [phone], text pages welcome Secure chat group [facility] Teaching Service

## 2023-10-01 ENCOUNTER — Encounter (HOSPITAL_COMMUNITY): Payer: Medicaid Other

## 2023-10-01 DIAGNOSIS — E1142 Type 2 diabetes mellitus with diabetic polyneuropathy: Secondary | ICD-10-CM

## 2023-10-01 DIAGNOSIS — L97526 Non-pressure chronic ulcer of other part of left foot with bone involvement without evidence of necrosis: Secondary | ICD-10-CM | POA: Diagnosis not present

## 2023-10-01 DIAGNOSIS — E119 Type 2 diabetes mellitus without complications: Secondary | ICD-10-CM

## 2023-10-01 DIAGNOSIS — M86171 Other acute osteomyelitis, right ankle and foot: Secondary | ICD-10-CM | POA: Diagnosis not present

## 2023-10-01 DIAGNOSIS — Z794 Long term (current) use of insulin: Secondary | ICD-10-CM

## 2023-10-01 DIAGNOSIS — E11621 Type 2 diabetes mellitus with foot ulcer: Secondary | ICD-10-CM

## 2023-10-01 DIAGNOSIS — M868X7 Other osteomyelitis, ankle and foot: Secondary | ICD-10-CM | POA: Diagnosis not present

## 2023-10-01 DIAGNOSIS — M869 Osteomyelitis, unspecified: Secondary | ICD-10-CM | POA: Diagnosis present

## 2023-10-01 LAB — CBC
HCT: 29.7 % — ABNORMAL LOW (ref 36.0–46.0)
Hemoglobin: 9.6 g/dL — ABNORMAL LOW (ref 12.0–15.0)
MCH: 32.5 pg (ref 26.0–34.0)
MCHC: 32.3 g/dL (ref 30.0–36.0)
MCV: 100.7 fL — ABNORMAL HIGH (ref 80.0–100.0)
Platelets: 189 10*3/uL (ref 150–400)
RBC: 2.95 MIL/uL — ABNORMAL LOW (ref 3.87–5.11)
RDW: 12.7 % (ref 11.5–15.5)
WBC: 3.9 10*3/uL — ABNORMAL LOW (ref 4.0–10.5)
nRBC: 0 % (ref 0.0–0.2)

## 2023-10-01 LAB — BASIC METABOLIC PANEL
Anion gap: 5 (ref 5–15)
BUN: 10 mg/dL (ref 8–23)
CO2: 25 mmol/L (ref 22–32)
Calcium: 9 mg/dL (ref 8.9–10.3)
Chloride: 110 mmol/L (ref 98–111)
Creatinine, Ser: 0.75 mg/dL (ref 0.44–1.00)
GFR, Estimated: >60 mL/min (ref >60)
Glucose, Bld: 126 mg/dL — ABNORMAL HIGH (ref 70–99)
Potassium: 4.2 mmol/L (ref 3.5–5.1)
Sodium: 140 mmol/L (ref 135–145)

## 2023-10-01 LAB — HEMOGLOBIN A1C
Hgb A1c MFr Bld: 6.2 % — ABNORMAL HIGH (ref 4.8–5.6)
Mean Plasma Glucose: 131.24 mg/dL

## 2023-10-01 LAB — GLUCOSE, CAPILLARY
Glucose-Capillary: 106 mg/dL — ABNORMAL HIGH (ref 70–99)
Glucose-Capillary: 195 mg/dL — ABNORMAL HIGH (ref 70–99)
Glucose-Capillary: 207 mg/dL — ABNORMAL HIGH (ref 70–99)
Glucose-Capillary: 107 mg/dL — ABNORMAL HIGH (ref 70–99)

## 2023-10-01 MED ORDER — CHLORHEXIDINE GLUCONATE CLOTH 2 % EX PADS: 6.0000 | MEDICATED_PAD | Freq: Once | CUTANEOUS | Status: DC

## 2023-10-01 MED ORDER — METRONIDAZOLE 500 MG PO TABS: 500.0000 mg | ORAL_TABLET | Freq: Two times a day (BID) | ORAL | Status: DC

## 2023-10-01 MED ORDER — CHLORHEXIDINE GLUCONATE CLOTH 2 % EX PADS
6.0000 | MEDICATED_PAD | Freq: Once | CUTANEOUS | Status: AC
  Administered 2023-10-01: 6 via TOPICAL

## 2023-10-01 MED ORDER — INSULIN GLARGINE-YFGN 100 UNIT/ML ~~LOC~~ SOLN
15.0000 [IU] | Freq: Every day | SUBCUTANEOUS | Status: DC
  Administered 2023-10-01: 15 [IU] via SUBCUTANEOUS
  Filled 2023-10-01 (×2): qty 0.15

## 2023-10-01 MED ORDER — ORAL CARE MOUTH RINSE: 15.0000 mL | OROMUCOSAL | Status: DC | PRN

## 2023-10-01 NOTE — Assessment & Plan Note (Addendum)
A1c of 6.2, within goal.  Home regimen of 40 units of Lantus daily and sliding scale with meals.  Taking metformin 100 mg twice daily and Ozempic weekly. - Semglee 30 units nightly - CBGs with meals - Moderate sliding scale - Tight control of CBGs due to infection

## 2023-10-01 NOTE — Progress Notes (Addendum)
Daily Progress Note Intern Pager: [phone]  Patient name: [name] Medical record number: [id number] Date of birth: 05-May-1961 Age: 62 y.o. Gender: female  Primary Care Provider: Porfirio Oar, PA Consultants: Podiatry Code Status: FULL  Pt Overview and Major Events to Date:  11/2: Admitted  Assessment and Plan:  [name] is a 62 y.o female presenting with chronic right great toe diabetic ulcer, found to have osteomyelitis of right great toe and fifth digit. Assessment & Plan Osteomyelitis of right foot (HCC) Evidence of acute osteomyelitis of right distal phalanx of first toe and acute osteomyelitis of proximal distal phalanx of fifth toe.  Concerned patient may need amputation. - Podiatry consulted, appreciate recommendations - Continue vancomycin and ceftriaxone - Follow-up ABI - Monitor fever curve - Tylenol as needed for pain - Follow-up blood cultures  Type 2 diabetes mellitus (HCC) A1c of 6.2, within goal.  Home regimen of 40 units of Lantus daily and sliding scale with meals.  Taking metformin 100 mg twice daily and Ozempic weekly. - Semglee 30 units nightly - CBGs with meals - Moderate sliding scale - Tight control of CBGs due to infection   Chronic and Stable Problems:  HLD: continue Lipitor  HTN: Continue Lisinopril 20 mg Insomnia: continue ambien 5 mg   FEN/GI: Carb modified PPx: Lovenox Dispo: Pending podiatry evaluation, PT/OT recs    Subjective:  Patient resting comfortably in bed, no acute concerns.  Objective: Temp:  [99 F (37.2 C)-99.1 F (37.3 C)] 99.1 F (37.3 C) (11/02 2123) Pulse Rate:  [82-101] 87 (11/02 2123) Resp:  [11-20] 18 (11/02 2123) BP: (84-125)/(40-71) 104/61 (11/02 2123) SpO2:  [96 %-100 %] 100 % (11/02 2123) Weight:  [109.8 kg] 109.8 kg (11/02 1235) Physical Exam: General: NAD, resting in bed Cardiovascular: RRR, no murmurs Respiratory: CTAB, normal work of breathing on room air Abdomen: Soft,  nontender, distended Extremities: Moving all 4 extremities  Laboratory: Most recent CBC Lab Results  Component Value Date   WBC 3.9 (L) 10/01/2023   HGB 9.6 (L) 10/01/2023   HCT 29.7 (L) 10/01/2023   MCV 100.7 (H) 10/01/2023   PLT 189 10/01/2023   Most recent BMP    Latest Ref Rng & Units 10/01/2023    3:00 AM  BMP  Glucose 70 - 99 mg/dL 696   BUN 8 - 23 mg/dL 10   Creatinine 2.95 - 1.00 mg/dL 2.84   Sodium 132 - 440 mmol/L 140   Potassium 3.5 - 5.1 mmol/L 4.2   Chloride 98 - 111 mmol/L 110   CO2 22 - 32 mmol/L 25   Calcium 8.9 - 10.3 mg/dL 9.0     Other pertinent labs none  Imaging/Diagnostic Tests:  MR FOOT RIGHT W WO CONTRAST Result Date: 09/30/2023 IMPRESSION: 1. Acute osteomyelitis of the right great toe distal phalanx. 2. Faint marrow edema within the distal aspect of the great toe proximal phalanx is favored to represent reactive osteitis. 3. Small wound or ulceration along the lateral aspect of the fifth toe near the interphalangeal joint. Acute osteomyelitis of the proximal and distal phalanx of the fifth toe. 4. Diffuse soft tissue swelling and edema throughout the dorsum of the foot. No organized or drainable fluid collections. Electronically Signed   By: Duanne Guess D.O.   On: 09/30/2023 20:30   Tiffany Kocher, DO 10/01/2023, 5:23 AM  PGY-2,  Family Medicine FPTS Intern pager: [phone], text pages welcome Secure chat group [facility] Teaching Service

## 2023-10-01 NOTE — Assessment & Plan Note (Signed)
Last A1c 6.1 12/23/2022.  Home regimen includes 40 units of Lantus daily as well as sliding scale with meals.  She also takes metformin 1000 mg twice daily and Ozempic weekly. -CBGs with meals and at night -Follow-up A1c -Lantus 30 units daily -Moderate sliding scale -Tightly control CBGs due to infection

## 2023-10-01 NOTE — Plan of Care (Signed)
  Problem: Education: Goal: Knowledge of General Education information will improve Description Including pain rating scale, medication(s)/side effects and non-pharmacologic comfort measures Outcome: Progressing   

## 2023-10-01 NOTE — Assessment & Plan Note (Signed)
Suspect that patient will need amputation.  Reassuringly stable vital signs without signs of systemic infection.  May likely be chronic. -Admit to family medicine teaching service, MedSurg, attending Dr. McDiarmid -Empiric vancomycin and ceftriaxone -Vitals per floor -Monitor fever curve -Tylenol as needed for pain -AM CBC and BMP -Follow-up blood cultures -Podiatry consulted, recommendations appreciated -Follow-up MRI foot

## 2023-10-01 NOTE — Assessment & Plan Note (Addendum)
Evidence of acute osteomyelitis of right distal phalanx of first toe and acute osteomyelitis of proximal distal phalanx of fifth toe.  Concerned patient may need amputation. - Podiatry consulted, appreciate recommendations - Continue vancomycin and ceftriaxone - Follow-up ABI - Monitor fever curve - Tylenol as needed for pain - Follow-up blood cultures

## 2023-10-01 NOTE — Consult Note (Signed)
Subjective:  [name], female    DOB: 1961-11-08,  MRN: [id number]  Patient with past medical history of type 2 DM and neuropathy, Endometrial cancer, GERD seen at beside today for concern of worsening right great toe infection. Patient has been in my care treated right hallux ulceration. She has had difficulty with compliance and offloading of the toe. She relates yesterday she noticed swelling in the foot and called our office for antibiotics. She was advised to come to the ED. Relates the swelling has improved. She has been through bone infection before and adamantly wants to try all other options before amputation.   Past Medical History:  Diagnosis Date   Anemia    Asthma 11/01/2010   only when respiratory infections   BPPV (benign paroxysmal positional vertigo) 03/07/2013   Broken teeth 11/02/2016   Callus of toe 07/07/2022   Colon cancer screening 05/05/2020   Diabetes mellitus    since 1983   Dizziness 09/22/2021   Elevated TSH 05/13/2013   Endometrial cancer (HCC)    Gangrene of toe of right foot (HCC) 04/14/2021   GERD (gastroesophageal reflux disease)    Hand pain, right 03/14/2019   History of radiation therapy    Vagina- 06/13/22-07/11/22-Dr. 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   Qualifier: Diagnosis of  By: Daphine Deutscher FNP, [place]     [facility]steomyel[facility])    Pneumonia    as a child   Wrist pain 04/11/2013     Past Surgical History:  Procedure Laterality Date   ABDOMINAL HYSTERECTOMY     ACHILLES TENDON LENGTHENING Left 08/29/2014   Procedure: Left Achilles Lengthening;  Surgeon: Nadara Mustard, MD;  Location: [facility] OR;  Service: Orthopedics;  Laterality: Left;   AMPUTATION Left 02/19/2013   Procedure: Amputation of Left Great Toe;  Surgeon: Kathryne Hitch, MD;  Location: [facility] OR;  Service: Orthopedics;  Laterality: Left;    AMPUTATION Left 08/29/2014   Procedure: Left Foot 2nd and 1st Toe Amputation;  Surgeon: Nadara Mustard, MD;  Location: [facility] OR;  Service: Orthopedics;  Laterality: Left;   CARDIAC CATHETERIZATION     15 years ago   CESAREAN SECTION     x 4   CYSTOSCOPY  03/30/2022   Procedure: CYSTOSCOPY;  Surgeon: Carver Fila, MD;  Location: [facility] ORS;  Service: Gynecology;;   IR IMAGING GUIDED PORT INSERTION  04/19/2022   ROBOTIC ASSISTED TOTAL HYSTERECTOMY WITH BILATERAL SALPINGO OOPHERECTOMY Bilateral 03/30/2022   Procedure: XI ROBOTIC ASSISTED TOTAL HYSTERECTOMY WITH BILATERAL SALPINGO-OOPHORECTOMY, MINI LAPAROTOMY;  Surgeon: Carver Fila, MD;  Location: [facility] ORS;  Service: Gynecology;  Laterality: Bilateral;   SENTINEL NODE BIOPSY N/A 03/30/2022   Procedure: SENTINEL NODE BIOPSY;  Surgeon: Carver Fila, MD;  Location: [facility] ORS;  Service: Gynecology;  Laterality: N/A;   TUBAL LIGATION     at one of c-sections       Latest Ref Rng & Units 10/01/2023    3:00 AM 09/30/2023    3:42 PM 08/31/2023    2:06 PM  CBC  WBC 4.0 - 10.5 K/uL 3.9  5.5  5.4   Hemoglobin 12.0 - 15.0 g/dL 9.6  40.9  81.1   Hematocrit 36.0 - 46.0 % 29.7  31.7  32.5   Platelets 150 - 400 K/uL 189  215  207  Latest Ref Rng & Units 10/01/2023    3:00 AM 09/30/2023    3:42 PM 08/31/2023    2:06 PM  BMP  Glucose 70 - 99 mg/dL 440  102  725   BUN 8 - 23 mg/dL 10  13  15    Creatinine 0.44 - 1.00 mg/dL 3.66  4.40  3.47   Sodium 135 - 145 mmol/L 140  139  141   Potassium 3.5 - 5.1 mmol/L 4.2  4.5  4.2   Chloride 98 - 111 mmol/L 110  106  108   CO2 22 - 32 mmol/L 25  25  27    Calcium 8.9 - 10.3 mg/dL 9.0  9.1  9.2      Objective:   Vitals:   10/01/23 0600 10/01/23 0814  BP: 125/73 115/76  Pulse: 73 83  Resp: 16 16  Temp:  98.4 F (36.9 C)  SpO2: 98% 100%    General:AA&O x 3. Normal mood and affect   Vascular: DP and PT pulses 2/4 bilateral. Brisk capillary refill to all digits. Pedal hair present    Neruological. Epicritic sensation grossly intact.   Derm: Distal plantar right hallux wound with necrotic base. Mild erythema and edema and near probe to bone. Fifth digit corn noted as well. No erythema or edema noted in this area.   MSK: MMT 5/5 in dorsiflexion, plantar flexion, inversion and eversion. Normal joint ROM without pain or crepitus.      MRI right foot  IMPRESSION: 1. Acute osteomyelitis of the right great toe distal phalanx. 2. Faint marrow edema within the distal aspect of the great toe proximal phalanx is favored to represent reactive osteitis. 3. Small wound or ulceration along the lateral aspect of the fifth toe near the interphalangeal joint. Acute osteomyelitis of the proximal and distal phalanx of the fifth toe. 4. Diffuse soft tissue swelling and edema throughout the dorsum of the foot. No organized or drainable fluid collections.  Assessment & Plan:  Patient was evaluated and treated and all questions answered.  DX: Right great toe osteomyelitis and fifth digit osteomyelitis  Wound care: betadine, DSD  Antibiotics: Continue IV antibioitcs DME: CAM boot   Discussed with patient diagnosis and treatment options.  Imaging reviewed. MRI showing osteomyelitis of the great toe and the fifth digit.  Discussed with patient in detail options including amputation vs antibitoics and wound care. Patient would like to try the later option and try everything before amputation. Toe appears stable at this time  Discussed debridement and bone biopsy of the toe to help guide antibiotics. Will plan for debridement and bone biopsy possibly tomorrow afternoon with Dr. Annamary Rummage.  NPO after midnight.   Will need ID on board for long term antibiotics plan pending cultures.  Patient in agreement with plan and all questions answered.   Louann Sjogren, DPM  Accessible via secure chat for questions or concerns.

## 2023-10-02 ENCOUNTER — Inpatient Hospital Stay (HOSPITAL_COMMUNITY): Payer: Medicaid Other

## 2023-10-02 ENCOUNTER — Inpatient Hospital Stay (HOSPITAL_COMMUNITY): Payer: Medicaid Other | Admitting: Anesthesiology

## 2023-10-02 ENCOUNTER — Encounter (HOSPITAL_COMMUNITY)
Admission: EM | Disposition: A | Discharge status: Home / Self Care | Payer: Self-pay | Source: Home / Self Care | Attending: Family Medicine

## 2023-10-02 ENCOUNTER — Encounter (HOSPITAL_COMMUNITY): Payer: Self-pay | Admitting: Family Medicine

## 2023-10-02 ENCOUNTER — Other Ambulatory Visit: Payer: Self-pay

## 2023-10-02 DIAGNOSIS — M868X7 Other osteomyelitis, ankle and foot: Secondary | ICD-10-CM | POA: Diagnosis not present

## 2023-10-02 DIAGNOSIS — M869 Osteomyelitis, unspecified: Secondary | ICD-10-CM

## 2023-10-02 DIAGNOSIS — L97526 Non-pressure chronic ulcer of other part of left foot with bone involvement without evidence of necrosis: Secondary | ICD-10-CM | POA: Diagnosis not present

## 2023-10-02 DIAGNOSIS — E1169 Type 2 diabetes mellitus with other specified complication: Secondary | ICD-10-CM

## 2023-10-02 DIAGNOSIS — M86171 Other acute osteomyelitis, right ankle and foot: Secondary | ICD-10-CM | POA: Diagnosis not present

## 2023-10-02 DIAGNOSIS — E11621 Type 2 diabetes mellitus with foot ulcer: Secondary | ICD-10-CM | POA: Diagnosis not present

## 2023-10-02 HISTORY — PX: IRRIGATION AND DEBRIDEMENT FOOT: SHX6602

## 2023-10-02 LAB — BASIC METABOLIC PANEL
Anion gap: 7 (ref 5–15)
BUN: 13 mg/dL (ref 8–23)
CO2: 25 mmol/L (ref 22–32)
Calcium: 9 mg/dL (ref 8.9–10.3)
Chloride: 109 mmol/L (ref 98–111)
Creatinine, Ser: 0.77 mg/dL (ref 0.44–1.00)
GFR, Estimated: >60 mL/min (ref >60)
Glucose, Bld: 88 mg/dL (ref 70–99)
Potassium: 4 mmol/L (ref 3.5–5.1)
Sodium: 141 mmol/L (ref 135–145)

## 2023-10-02 LAB — CBC
HCT: 31 % — ABNORMAL LOW (ref 36.0–46.0)
Hemoglobin: 9.8 g/dL — ABNORMAL LOW (ref 12.0–15.0)
MCH: 31.9 pg (ref 26.0–34.0)
MCHC: 31.6 g/dL (ref 30.0–36.0)
MCV: 101 fL — ABNORMAL HIGH (ref 80.0–100.0)
Platelets: 200 10*3/uL (ref 150–400)
RBC: 3.07 MIL/uL — ABNORMAL LOW (ref 3.87–5.11)
RDW: 12.7 % (ref 11.5–15.5)
WBC: 4.2 10*3/uL (ref 4.0–10.5)
nRBC: 0 % (ref 0.0–0.2)

## 2023-10-02 LAB — GLUCOSE, CAPILLARY
Glucose-Capillary: 89 mg/dL (ref 70–99)
Glucose-Capillary: 124 mg/dL — ABNORMAL HIGH (ref 70–99)
Glucose-Capillary: 95 mg/dL (ref 70–99)
Glucose-Capillary: 97 mg/dL (ref 70–99)
Glucose-Capillary: 109 mg/dL — ABNORMAL HIGH (ref 70–99)
Glucose-Capillary: 285 mg/dL — ABNORMAL HIGH (ref 70–99)

## 2023-10-02 SURGERY — IRRIGATION AND DEBRIDEMENT FOOT
Anesthesia: Monitor Anesthesia Care | Site: Foot | Laterality: Right

## 2023-10-02 SURGERY — IRRIGATION AND DEBRIDEMENT FOOT
Anesthesia: Monitor Anesthesia Care | Laterality: Right

## 2023-10-02 MED ORDER — ONDANSETRON HCL 4 MG/2ML IJ SOLN
INTRAMUSCULAR | Status: AC
  Filled 2023-10-02: qty 2

## 2023-10-02 MED ORDER — ENOXAPARIN SODIUM 60 MG/0.6ML IJ SOSY: 50.0000 mg | PREFILLED_SYRINGE | Freq: Every day | INTRAMUSCULAR | Status: DC

## 2023-10-02 MED ORDER — FENTANYL CITRATE (PF) 250 MCG/5ML IJ SOLN
INTRAMUSCULAR | Status: AC
  Filled 2023-10-02: qty 5

## 2023-10-02 MED ORDER — KETAMINE HCL 10 MG/ML IJ SOLN
INTRAMUSCULAR | Status: DC | PRN
  Administered 2023-10-02: 10 mg via INTRAVENOUS

## 2023-10-02 MED ORDER — SODIUM CHLORIDE 0.9 % IV SOLN: INTRAVENOUS | Status: DC

## 2023-10-02 MED ORDER — POLYETHYLENE GLYCOL 3350 17 G PO PACK: 17.0000 g | PACK | Freq: Every day | ORAL | Status: DC

## 2023-10-02 MED ORDER — ENOXAPARIN SODIUM 60 MG/0.6ML IJ SOSY
50.0000 mg | PREFILLED_SYRINGE | Freq: Every day | INTRAMUSCULAR | Status: DC
  Administered 2023-10-03 – 2023-10-06 (×4): 50 mg via SUBCUTANEOUS
  Filled 2023-10-02 (×4): qty 0.6

## 2023-10-02 MED ORDER — FENTANYL CITRATE (PF) 250 MCG/5ML IJ SOLN
INTRAMUSCULAR | Status: DC | PRN
  Administered 2023-10-02: 50 ug via INTRAVENOUS

## 2023-10-02 MED ORDER — MIDAZOLAM HCL 2 MG/2ML IJ SOLN
INTRAMUSCULAR | Status: AC
  Filled 2023-10-02: qty 2

## 2023-10-02 MED ORDER — POLYETHYLENE GLYCOL 3350 17 G PO PACK
17.0000 g | PACK | Freq: Every day | ORAL | Status: DC | PRN
  Administered 2023-10-03 (×2): 17 g via ORAL
  Filled 2023-10-02 (×2): qty 1

## 2023-10-02 MED ORDER — CHLORHEXIDINE GLUCONATE 0.12 % MT SOLN
OROMUCOSAL | Status: AC
  Administered 2023-10-02: 15 mL via OROMUCOSAL
  Filled 2023-10-02: qty 15

## 2023-10-02 MED ORDER — KETAMINE HCL 50 MG/5ML IJ SOSY
PREFILLED_SYRINGE | INTRAMUSCULAR | Status: AC
  Filled 2023-10-02: qty 5

## 2023-10-02 MED ORDER — ONDANSETRON HCL 4 MG/2ML IJ SOLN
INTRAMUSCULAR | Status: DC | PRN
  Administered 2023-10-02: 4 mg via INTRAVENOUS

## 2023-10-02 MED ORDER — ORAL CARE MOUTH RINSE: 15.0000 mL | Freq: Once | OROMUCOSAL | Status: AC

## 2023-10-02 MED ORDER — CHLORHEXIDINE GLUCONATE 0.12 % MT SOLN: 15.0000 mL | Freq: Once | OROMUCOSAL | Status: AC

## 2023-10-02 MED ORDER — LIDOCAINE HCL (PF) 1 % IJ SOLN
INTRAMUSCULAR | Status: DC | PRN
  Administered 2023-10-02: 10 mL

## 2023-10-02 MED ORDER — 0.9 % SODIUM CHLORIDE (POUR BTL) OPTIME
TOPICAL | Status: DC | PRN
  Administered 2023-10-02: 1000 mL

## 2023-10-02 MED ORDER — LIDOCAINE HCL 1 % IJ SOLN
INTRAMUSCULAR | Status: AC
  Filled 2023-10-02: qty 20

## 2023-10-02 MED ORDER — PROPOFOL 10 MG/ML IV BOLUS
INTRAVENOUS | Status: DC | PRN
  Administered 2023-10-02: 50 mg via INTRAVENOUS

## 2023-10-02 MED ORDER — MIDAZOLAM HCL 2 MG/2ML IJ SOLN
INTRAMUSCULAR | Status: DC | PRN
  Administered 2023-10-02: 2 mg via INTRAVENOUS

## 2023-10-02 MED ORDER — INSULIN GLARGINE-YFGN 100 UNIT/ML ~~LOC~~ SOLN
10.0000 [IU] | Freq: Every day | SUBCUTANEOUS | Status: DC
  Administered 2023-10-02: 10 [IU] via SUBCUTANEOUS
  Filled 2023-10-02 (×2): qty 0.1

## 2023-10-02 MED ORDER — PROPOFOL 500 MG/50ML IV EMUL
INTRAVENOUS | Status: DC | PRN
  Administered 2023-10-02: 75 ug/kg/min via INTRAVENOUS

## 2023-10-02 SURGICAL SUPPLY — 43 items
APL PRP STRL LF DISP 70% ISPRP (MISCELLANEOUS) ×1
BLADE OSC/SAGITTAL MD 5.5X18 (BLADE) ×1 IMPLANT
BLADE SURG 10 STRL SS (BLADE) ×1 IMPLANT
BLADE SURG 15 STRL LF DISP TIS (BLADE) ×2 IMPLANT
BLADE SURG 15 STRL SS (BLADE)
BNDG CMPR 5X3 CHSV STRCH STRL (GAUZE/BANDAGES/DRESSINGS)
BNDG CMPR 5X3 KNIT ELC UNQ LF (GAUZE/BANDAGES/DRESSINGS)
BNDG CMPR 5X4 KNIT ELC UNQ LF (GAUZE/BANDAGES/DRESSINGS) ×1
BNDG CMPR 75X21 PLY HI ABS (MISCELLANEOUS)
BNDG CMPR 9X4 STRL LF SNTH (GAUZE/BANDAGES/DRESSINGS)
BNDG COHESIVE 3X5 TAN ST LF (GAUZE/BANDAGES/DRESSINGS) ×1 IMPLANT
BNDG ELASTIC 3INX 5YD STR LF (GAUZE/BANDAGES/DRESSINGS) ×1 IMPLANT
BNDG ELASTIC 4INX 5YD STR LF (GAUZE/BANDAGES/DRESSINGS) IMPLANT
BNDG ESMARK 4X9 LF (GAUZE/BANDAGES/DRESSINGS) ×1 IMPLANT
BNDG GAUZE DERMACEA FLUFF 4 (GAUZE/BANDAGES/DRESSINGS) IMPLANT
BNDG GZE DERMACEA 4 6PLY (GAUZE/BANDAGES/DRESSINGS) ×1
CHLORAPREP W/TINT 26 (MISCELLANEOUS) ×1 IMPLANT
DRSG XEROFORM 1X8 (GAUZE/BANDAGES/DRESSINGS) IMPLANT
ELECT REM PT RETURN 9FT ADLT (ELECTROSURGICAL) ×1
ELECTRODE REM PT RTRN 9FT ADLT (ELECTROSURGICAL) ×1 IMPLANT
GAUZE SPONGE 2X2 STRL 8-PLY (GAUZE/BANDAGES/DRESSINGS) ×2 IMPLANT
GAUZE SPONGE 4X4 12PLY STRL (GAUZE/BANDAGES/DRESSINGS) ×1 IMPLANT
GAUZE STRETCH 2X75IN STRL (MISCELLANEOUS) ×1 IMPLANT
GAUZE XEROFORM 1X8 LF (GAUZE/BANDAGES/DRESSINGS) ×1 IMPLANT
GLOVE BIO SURGEON STRL SZ7.5 (GLOVE) ×1 IMPLANT
GLOVE BIOGEL PI IND STRL 7.5 (GLOVE) ×1 IMPLANT
GOWN STRL REUS W/ TWL LRG LVL3 (GOWN DISPOSABLE) ×2 IMPLANT
GOWN STRL REUS W/TWL LRG LVL3 (GOWN DISPOSABLE) ×2
KIT BASIN OR (CUSTOM PROCEDURE TRAY) ×1 IMPLANT
NDL BIOPSY JAMSHIDI 8X6 (NEEDLE) IMPLANT
NDL HYPO 25X1 1.5 SAFETY (NEEDLE) ×2 IMPLANT
NEEDLE BIOPSY JAMSHIDI 8X6 (NEEDLE) ×2 IMPLANT
NEEDLE HYPO 25X1 1.5 SAFETY (NEEDLE) ×1 IMPLANT
PACK ORTHO EXTREMITY (CUSTOM PROCEDURE TRAY) ×1 IMPLANT
PADDING CAST ABS COTTON 4X4 ST (CAST SUPPLIES) ×2 IMPLANT
SPIKE FLUID TRANSFER (MISCELLANEOUS) ×2 IMPLANT
STAPLER VISISTAT 35W (STAPLE) IMPLANT
STOCKINETTE 4X48 STRL (DRAPES) ×1 IMPLANT
SUT ETHILON 3 0 FSLX (SUTURE) ×1 IMPLANT
SUT PROLENE 4 0 PS 2 18 (SUTURE) ×2 IMPLANT
SYR CONTROL 10ML LL (SYRINGE) ×2 IMPLANT
UNDERPAD 30X36 HEAVY ABSORB (UNDERPADS AND DIAPERS) ×1 IMPLANT
WATER STERILE IRR 1000ML POUR (IV SOLUTION) ×1 IMPLANT

## 2023-10-02 NOTE — Assessment & Plan Note (Addendum)
A1c of 6.2, within goal.  Home regimen of 40 units of Lantus daily and sliding scale with meals.  Taking metformin 100 mg twice daily and Ozempic weekly. - decreased Semglee to 15 units (from 30) last night due to NPO status pre-op and CBGs in the 100s, hold PM Semglee - CBGs with meals - Moderate sliding scale - Tight control of CBGs due to infection

## 2023-10-02 NOTE — Plan of Care (Addendum)
FMTS Interim Progress Note  S: Patient doing well following procedure with podiatry today. Patient not in any pain, feeling somewhat tired. She requests something to help with having a bowel movement.  O: BP (!) 99/53 (BP Location: Left Arm)   Pulse 92   Temp 98.9 F (37.2 C) (Oral)   Resp 19   Ht 6\' 1"  (1.854 m)   Wt 109.8 kg   LMP 06/23/2014   SpO2 98%   BMI 31.93 kg/m    General: Well-appearing. Resting comfortably in chair. CV: Normal S1/S2. No extra heart sounds. Warm and well-perfused. Pulm: Breathing comfortably on room air. CTAB. No increased WOB. Ext: Well-wrapped R lower extremity and foot without significant drainage or bleeding. R foot nontender to palpation. R foot with normal cap refill.   Psych: Pleasant and appropriate.   A/P: Osteomyelitis of R foot Patient appears stable following debridement and biopsy procedure today. Per podiatry op note, overall concern for significant osteonecrosis of the distal phalanx of the right hallux.  - Appreciate continued podiatry recommendations: No further surgical plans this admission, patient will be stable for discharge pending infectious disease recommendations of antibiotic therapy based on intraoperative cultures that were taken.    - Per podiatry, patient will be weightbearing as tolerated in a postop shoe to the operative limb until further instructed. The dressing is to remain clean, dry, and intact.  - Reach out to ID tomorrow for abx recommendations post-op - F/u bone biopsy, blood cultures  - Monitor fever curve - Tylenol PRN  T2DM Most recent BG elevated at 285. Patient did not receive any SSI today. Per nursing, AM CBG was out of range, patient refused SSI at lunch, and the evening SSI was held due to the procedure. Home insulin regimen of 40 units of Lantus daily and sliding scale with meals. Day team held nighttime Semglee today with planned procedure.  - Added back 10u Semglee daily at bedtime  - Continue SSI with  meals  - CBGs with meals   Mild constipation - Miralax prn for mild constipation symptoms  Continue treatment plan otherwise as indicated by day team.   Ivery Quale, MD 10/02/2023, 9:51 PM PGY-1, [facility] Family Medicine Service pager [phone]

## 2023-10-02 NOTE — Progress Notes (Signed)
     Daily Progress Note Intern Pager: [phone]  Patient name: [name] Medical record number: [id number] Date of birth: 1961-01-23 Age: 62 y.o. Gender: female  Primary Care Provider: Porfirio Oar, PA Consultants: podiatry Code Status: full  Pt Overview and Major Events to Date:  11/2: admitted  Assessment and Plan: [name] is a 62 y.o female presenting with chronic right great toe diabetic ulcer, found to have osteomyelitis of right great toe and fifth digit. Going to the OR for bone biopsy/debridement with podiatry today.  Assessment & Plan Osteomyelitis of right foot (HCC) Evidence of acute osteomyelitis of right distal phalanx of first toe and proximal distal phalanx of fifth toe on MRI. Podiatry recommending debridement and bone biopsy to guide ABX (held overnight to get better sample) today. Has remained afebrile and pain is well controlled.  - f/u post op for podiatry recs -- plan to consult ID to assist with long term ABX plan post-op - Follow-up blood cultures, no growth <24 hours - Monitor fever curve - Tylenol as needed for pain  Type 2 diabetes mellitus (HCC) A1c of 6.2, within goal.  Home regimen of 40 units of Lantus daily and sliding scale with meals.  Taking metformin 100 mg twice daily and Ozempic weekly. - decreased Semglee to 15 units (from 30) last night due to NPO status pre-op and CBGs in the 100s, hold PM Semglee - CBGs with meals - Moderate sliding scale - Tight control of CBGs due to infection   Chronic and Stable Issues: HLD: continue Lipitor  HTN: Continue Lisinopril 20 mg - holding due to soft pressures today Insomnia: continue ambien 5 mg B12 deficiency: monthly B12 injections (next scheduled for 11/14)  FEN/GI: NPO for OR today PPx: lovenox Dispo: pending clinical improvement  Subjective:  Pt states she is doing well this morning. Believes she will be going to the OR with podiatry at 5pm. Discussed low blood pressures, pt  states her BP is usually around 100/60. Denies pain.   Objective: Temp:  [97.9 F (36.6 C)-99.2 F (37.3 C)] 97.9 F (36.6 C) (11/04 0431) Pulse Rate:  [79-90] 79 (11/04 0431) Resp:  [16-18] 16 (11/04 0431) BP: (92-115)/(57-76) 97/61 (11/04 0431) SpO2:  [100 %] 100 % (11/04 0431) Physical Exam: General: walking around room, pleasant and conversant, in NAD Cardiovascular: RRR, normal S1/S2 Respiratory: CTAB, normal WOB on RA Abdomen: normoactive bowel sounds, soft, nontender, nondistended Extremities: R great toe covered in clean, dry dressing, no tenderness or edema to R foot, no edema to LLE  Laboratory: Most recent CBC Lab Results  Component Value Date   WBC 4.2 10/02/2023   HGB 9.8 (L) 10/02/2023   HCT 31.0 (L) 10/02/2023   MCV 101.0 (H) 10/02/2023   PLT 200 10/02/2023   Most recent BMP    Latest Ref Rng & Units 10/02/2023    3:04 AM  BMP  Glucose 70 - 99 mg/dL 88   BUN 8 - 23 mg/dL 13   Creatinine 1.47 - 1.00 mg/dL 8.29   Sodium 562 - 130 mmol/L 141   Potassium 3.5 - 5.1 mmol/L 4.0   Chloride 98 - 111 mmol/L 109   CO2 22 - 32 mmol/L 25   Calcium 8.9 - 10.3 mg/dL 9.0     Cordai Rodrigue, Tacey Ruiz, MD 10/02/2023, 7:19 AM  PGY-1, [facility] FPTS Intern pager: [phone], text pages welcome Secure chat group [facility] Teaching Service

## 2023-10-02 NOTE — Progress Notes (Signed)
10/02/2023 4:07 PM  [name]  has presented today for surgery, with the diagnosis of osteomyelitis of the right hallux and 5th toe.  The various methods of treatment have been discussed with the patient and family. After consideration of risks, benefits and other options for treatment, the patient has consented to   Procedure(s): IRRIGATION AND DEBRIDEMENT FOOT, bone biopsy (Right) as a surgical intervention.  The patient's history has been reviewed, patient examined, no change in status, stable for surgery.  I have reviewed the patient's chart and labs.  Questions were answered to the patient's satisfaction.     Jenelle Mages [name]

## 2023-10-02 NOTE — Plan of Care (Signed)
Went to pt bedside to assess R foot 5th digit. Digit without erythema or tenderness to palpation. Small scab overlying anterolateral aspect of R 5th digit (see photo- entered in media tab in error as L foot, is actually R foot). Will continue to monitor and restart ABX as appropriate following debridement and bone bx this evening.

## 2023-10-02 NOTE — Plan of Care (Signed)
  Problem: Education: Goal: Ability to describe self-care measures that may prevent or decrease complications (Diabetes Survival Skills Education) will improve Outcome: Progressing Goal: Individualized Educational Video(s) Outcome: Progressing   

## 2023-10-02 NOTE — Plan of Care (Signed)

## 2023-10-02 NOTE — Brief Op Note (Signed)
10/02/2023  5:01 PM  PATIENT:  [name]  62 y.o. female  PRE-OPERATIVE DIAGNOSIS:  Right toe osteomyelitis  POST-OPERATIVE DIAGNOSIS:  Right toe osteomyelitis  PROCEDURE:  Procedure(s): IRRIGATION AND DEBRIDEMENT FOOT, bone biopsy RIGHT GREAT TOE AND RIGHT 5TH TOE (Right)  SURGEON:  Surgeons and Role:    * Imaan Padgett, Jenelle Mages, DPM - Primary  PHYSICIAN ASSISTANT:   ASSISTANTS: none   ANESTHESIA:   local and IV sedation  EBL:  Minimal   BLOOD ADMINISTERED:none  DRAINS: none   LOCAL MEDICATIONS USED:  LIDOCAINE   SPECIMEN:  Source of Specimen:  Bone from 5th toe right foot culture and path, bone from right great toe distal phalanx culture and path , Deep tissue swab culture right hallux  DISPOSITION OF SPECIMEN:   Path and Micro  COUNTS:  YES  TOURNIQUET:  * No tourniquets in log *  DICTATION: .Note written in EPIC  PLAN OF CARE:  No further surgical plans, ID consult for abx selection pending culture and path data. Distal phalanx of right hallux with necrotic infected bone - will likely ultimately require amputation of at least the distal portion of the hallux  PATIENT DISPOSITION:  PACU - hemodynamically stable.   Delay start of Pharmacological VTE agent (>24hrs) due to surgical blood loss or risk of bleeding: no

## 2023-10-02 NOTE — Op Note (Signed)
\\\\\Full Operative Report  Date of Operation: 5:03 PM, 10/02/2023   Patient: [name] - 62 y.o. female  Surgeon: Pilar Plate, DPM   Assistant: None  Diagnosis: Right hallux osteomyelitis, right 5th toe corn, arthritis  Procedure:  1.  Debridement and irrigation of ulceration to level of bone distal hallux, right foot 2.  Hallux distal phalanx bone biopsy, right foot 3.  Fifth toe middle phalanx bone biopsy, right foot  Anesthesia: Monitor Anesthesia Care  Ellender, Catheryn Bacon, MD  Anesthesiologist: Beryle Lathe, MD; Bradley Ferris Catheryn Bacon, MD CRNA: Caryn Bee A, CRNA   Estimated Blood Loss: Minimal   Hemostasis: 1) Anatomical dissection, mechanical compression, electrocautery 2) no tourniquet was used during procedure  Implants: * No implants in log *  Materials: Prolene 4-0  Injectables: 1) Pre-operatively: 10 cc of 1%lidocaine plain split between the first and fifth toe on the right foot 2) Post-operatively: None   Specimens: Pathology: Right hallux distal phalanx bone for pathology, right fifth toe middle phalanx bone for pathology. microbiology: Right distal hallux deep tissue ulceration swab culture, right hallux distal phalanx bone for culture, right fifth toe middle phalanx bone for culture   Antibiotics: IV antibiotics had previously been given during admission however were held 1 day preoperative to allow for improved intraoperative cultures  Drains: None  Complications: Patient tolerated the procedure well without complication.   Operative findings: As below in detailed report  Indications for Procedure: [name] presents to [employer], Jenelle Mages, DPM with a chief complaint of ulceration chronic present to the right distal phalanx concern for underlying osteomyelitis.  Also with corn on the right fifth toe and concern for possible osteomyelitis at that location.  The patient has failed conservative treatments of various  modalities. At this time the patient has elected to proceed with surgical correction. All alternatives, risks, and complications of the procedures were thoroughly explained to the patient. Patient exhibits appropriate understanding of all discussion points and informed consent was signed and obtained in the chart with no guarantees to surgical outcome given or implied.  Description of Procedure: Patient was brought to the operating room. Patient remained on their hospital bed in the supine position. A surgical timeout was performed and all members of the operating room, the procedure, and the surgical site were identified. anesthesia occurred as per anesthesia record. Local anesthetic as previously described was then injected about the operative field in a local infiltrative block.  The operative lower extremity as noted above was then prepped and draped in the usual sterile manner. The following procedure then began.  Prior to prep with Betadine a deep tissue wound swab culture was directed to the distal aspect of the right hallux and a deep tissue culture swab was taken at the area of the ulceration where there was some purulent drainage expressed.  Aerobic and anaerobic cultures were taken at this time.  The foot was then prepped with Betadine.  Following prep attention was first directed to the fifth toe.  Using a 15 blade the corn of the dorsal lateral aspect of the fifth toe was removed.  There is no underlying ulceration.  Then using a Jamshidi needle a sample of bone and soft tissue from the middle phalanx of the right fifth toe was harvested.  The sample was split in 2 pieces in one was sent for pathology and 1 was sent for culture.  This small circular biopsy wound was then closed with Prolene suture after irrigation with sterile  saline.  Next, attention was directed to the right hallux.  The wound measured approximately 1.5 x 1 x 0.5 cm.  There is noted to be necrotic tissue present at the distal  right hallux with significant hyperkeratotic and necrotic tissues.  Excisional debridement to the level of distal phalanx bone was then performed with removal of necrotic nonviable bone and soft tissue's present.  There was bleeding and loose necrotic bone that was removed from the distal phalanx.  A fresh Jamshidi needle was then used to harvest 2 samples of bone 1 sent for pathology and 1 sent for culture from the distal phalanx at the site of necrosis.  Bone was soft and necrotic with evidence of acute osteomyelitis.  Next using a 15 blade excisional debridement was performed to remove all hyperkeratotic and necrotic tissue surrounding the ulceration.  The wound was then measured and measured similar measurements 1.5 x 1 x 0.5 cm postdebridement.  The wound was irrigated thoroughly with sterile saline.  Then using Prolene suture the soft tissue was reapproximated loosely at the ulceration site.  Remaining tissues around the wound appeared somewhat viable and were bleeding however suspect there will be additional necrosis postoperatively.  The surgical site was then dressed with Xeroform 4 x 4 Kerlix Ace wrap. The patient tolerated both the procedure and anesthesia well with vital signs stable throughout. The patient was transferred in good condition and all vital signs stable  from the OR to recovery under the discretion of anesthesia.  Condition: Vital signs stable, neurovascular status unchanged from preoperative   Surgical plan:  Overall concern for significant osteonecrosis of the distal phalanx of the right hallux.  Confident of osteomyelitis diagnosis in the right hallux and do fear that ultimately will require partial or total amputation of the right hallux.  fifth toe without evidence of significant necrosis in the middle phalanx bone that was taken.  Less concern for infection at that location however cannot rule out completely.  No further surgical plans this admission patient will be stable for  discharge pending infectious disease recommendations of antibiotic therapy based on intraoperative cultures that were taken.  If patient changes her mind on amputation we will be happy to perform this during the current admission pending further discussions between the patient, primary team, and infectious disease.  If patient has worsening of her ulceration/worsening infection requiring additional hospitalization option will be for amputation.  The patient will be weightbearing as tolerated in a postop shoe to the operative limb until further instructed. The dressing is to remain clean, dry, and intact. Will continue to follow unless noted elsewhere.   Carlena Hurl, DPM [facility]

## 2023-10-02 NOTE — Anesthesia Preprocedure Evaluation (Addendum)
Anesthesia Evaluation  Patient identified by MRN, date of birth, ID band Patient awake    Reviewed: Allergy & Precautions, NPO status , Patient's Chart, lab work & pertinent test results  History of Anesthesia Complications Negative for: history of anesthetic complications  Airway Mallampati: I  TM Distance: >3 FB Neck ROM: Full    Dental  (+) Dental Advisory Given   Pulmonary asthma , former smoker   Pulmonary exam normal        Cardiovascular hypertension, Pt. on medications + Peripheral Vascular Disease  Normal cardiovascular exam     Neuro/Psych  BPPV   Neuromuscular disease  negative psych ROS   GI/Hepatic Neg liver ROS,GERD  Controlled and Medicated,,  Endo/Other  diabetes, Type 2, Insulin Dependent, Oral Hypoglycemic Agents   Obesity   Renal/GU negative Renal ROS     Musculoskeletal negative musculoskeletal ROS (+)    Abdominal   Peds  Hematology  (+) Blood dyscrasia, anemia   Anesthesia Other Findings On GLP1-a, last dose over 1 week ago    Reproductive/Obstetrics                             Anesthesia Physical Anesthesia Plan  ASA: 3  Anesthesia Plan: MAC and Regional   Post-op Pain Management: Tylenol PO (pre-op)*   Induction:   PONV Risk Score and Plan: 2 and Propofol infusion and Treatment may vary due to age or medical condition  Airway Management Planned: Natural Airway and Simple Face Mask  Additional Equipment: None  Intra-op Plan:   Post-operative Plan:   Informed Consent: I have reviewed the patients History and Physical, chart, labs and discussed the procedure including the risks, benefits and alternatives for the proposed anesthesia with the patient or authorized representative who has indicated his/her understanding and acceptance.       Plan Discussed with: CRNA and Anesthesiologist  Anesthesia Plan Comments:        Anesthesia Quick  Evaluation

## 2023-10-02 NOTE — Hospital Course (Addendum)
[name] is a 62 y.o.female with a history of endometrial cancer, GERD, HTN, T2DM, and peripheral neuropathy who was admitted to the [facility] Medicine Teaching Service at [facility] for R great toe ulcer. Her hospital course is detailed below:   R foot osteomyelitis Presented with worsening R great toe wound, concern for osteomyelitis. Started vanc and ceftriaxone. MRI showed acute osteomyelitis of R great toe distal phalanx and proximal/distal 5th toe phalanx. Podiatry consulted, recommended debridement and bone biopsy. ID was consulted and recommended broad spectrum coverage with Vanc, ceftriaxone, and metronidazole prior to sensitivities. Switched to ertapenem to cover for GBS and Enterobacter that grew in cultures. Discharged with IV ertapenem 1g daily until 10/30/23 (pt has port).  T2DM Blood sugars were well-controlled on long-acting insulin, meal time short-acting insulin, and SSI throughout hospitalization. Held home metformin and ozempic.  Other chronic conditions were medically managed with home medications and formulary alternatives as necessary.  PCP Follow-up Recommendations: Consider discontinuing Ambien Podiatry follow-up Dressing change to Right hallux every 3-4 days at home, home health dressing supplies to include betadine, gauze, xeroform, 4x4 kerlix ace wrap.  Discuss restarting Lisinopril, held due to soft pressures

## 2023-10-02 NOTE — Transfer of Care (Signed)
Immediate Anesthesia Transfer of Care Note  Patient: [name]  Procedure(s) Performed: IRRIGATION AND DEBRIDEMENT FOOT, bone biopsy RIGHT GREAT TOE AND RIGHT 5TH TOE (Right: Foot)  Patient Location: PACU  Anesthesia Type:MAC  Level of Consciousness: awake  Airway & Oxygen Therapy: Patient Spontanous Breathing  Post-op Assessment: Report given to RN  Post vital signs: Reviewed and stable  Last Vitals:  Vitals Value Taken Time  BP    Temp    Pulse    Resp    SpO2      Last Pain:  Vitals:   10/02/23 1535  TempSrc:   PainSc: 0-No pain      Patients Stated Pain Goal: 2 (10/02/23 1535)  Complications: No notable events documented.

## 2023-10-02 NOTE — Progress Notes (Signed)
Orthopedic Tech Progress Note Patient Details:  [name] [birth year] [id number]  Ortho Devices Type of Ortho Device: Postop shoe/boot Ortho Device/Splint Location: RLE Ortho Device/Splint Interventions: Application, Ordered   Post Interventions Patient Tolerated: Well Instructions Provided: Adjustment of device  [name] 10/02/2023, 7:49 PM

## 2023-10-02 NOTE — Assessment & Plan Note (Addendum)
Evidence of acute osteomyelitis of right distal phalanx of first toe and proximal distal phalanx of fifth toe on MRI. Podiatry recommending debridement and bone biopsy to guide ABX (held overnight to get better sample) today. Has remained afebrile and pain is well controlled.  - f/u post op for podiatry recs -- plan to consult ID to assist with long term ABX plan post-op - Follow-up blood cultures, no growth <24 hours - Monitor fever curve - Tylenol as needed for pain

## 2023-10-02 NOTE — Progress Notes (Signed)
CCC Pre-op Review  Pre-op checklist:   Incomplete  NPO:   yes/ since 2200 last evening  Labs:  CBG 89 @ 0757  today     Glu 88  wbc 4.2   h/h 9.8 / 31  Consent:   not documented om chklist  H&P:  needs update  Vitals:  @ 0743  98.2 p83 r not recorded  89/58  sat 93 RA  O2 requirements:   RA  MAR/PTA review:  hx of GLP-1 use    Lovenox NOT GIVEN     Vanc 11/3 @ 0511   IV:   PAC r chest in use  Floor nurse name:   Barbie Haggis RN  [phone]  Additional info:   DM 2  hx of Ozempic weekly

## 2023-10-03 ENCOUNTER — Encounter (HOSPITAL_COMMUNITY): Payer: Self-pay | Admitting: Podiatry

## 2023-10-03 DIAGNOSIS — L97526 Non-pressure chronic ulcer of other part of left foot with bone involvement without evidence of necrosis: Secondary | ICD-10-CM | POA: Diagnosis not present

## 2023-10-03 DIAGNOSIS — M869 Osteomyelitis, unspecified: Secondary | ICD-10-CM

## 2023-10-03 DIAGNOSIS — E11621 Type 2 diabetes mellitus with foot ulcer: Secondary | ICD-10-CM | POA: Diagnosis not present

## 2023-10-03 DIAGNOSIS — E1142 Type 2 diabetes mellitus with diabetic polyneuropathy: Secondary | ICD-10-CM | POA: Diagnosis not present

## 2023-10-03 LAB — GLUCOSE, CAPILLARY
Glucose-Capillary: 138 mg/dL — ABNORMAL HIGH (ref 70–99)
Glucose-Capillary: 246 mg/dL — ABNORMAL HIGH (ref 70–99)
Glucose-Capillary: 223 mg/dL — ABNORMAL HIGH (ref 70–99)
Glucose-Capillary: 340 mg/dL — ABNORMAL HIGH (ref 70–99)

## 2023-10-03 MED ORDER — INSULIN GLARGINE-YFGN 100 UNIT/ML ~~LOC~~ SOLN
30.0000 [IU] | Freq: Every day | SUBCUTANEOUS | Status: DC
Start: 2023-10-03 — End: 2023-10-04
  Administered 2023-10-03: 30 [IU] via SUBCUTANEOUS
  Filled 2023-10-03 (×2): qty 0.3

## 2023-10-03 MED ORDER — BISACODYL 5 MG PO TBEC
5.0000 mg | DELAYED_RELEASE_TABLET | Freq: Every day | ORAL | Status: DC
  Administered 2023-10-03 – 2023-10-04 (×2): 5 mg via ORAL
  Filled 2023-10-03 (×4): qty 1

## 2023-10-03 MED ORDER — VANCOMYCIN HCL IN DEXTROSE 1-5 GM/200ML-% IV SOLN
1000.0000 mg | Freq: Two times a day (BID) | INTRAVENOUS | Status: DC
  Administered 2023-10-03 – 2023-10-04 (×4): 1000 mg via INTRAVENOUS
  Filled 2023-10-03 (×5): qty 200

## 2023-10-03 MED ORDER — TRAMADOL HCL 50 MG PO TABS
50.0000 mg | ORAL_TABLET | Freq: Four times a day (QID) | ORAL | Status: DC | PRN
  Administered 2023-10-03 – 2023-10-05 (×3): 50 mg via ORAL
  Filled 2023-10-03 (×4): qty 1

## 2023-10-03 MED ORDER — SODIUM CHLORIDE 0.9 % IV SOLN
2.0000 g | INTRAVENOUS | Status: DC
  Administered 2023-10-03 – 2023-10-06 (×4): 2 g via INTRAVENOUS
  Filled 2023-10-03 (×4): qty 20

## 2023-10-03 MED ORDER — METRONIDAZOLE 500 MG/100ML IV SOLN
500.0000 mg | Freq: Two times a day (BID) | INTRAVENOUS | Status: DC
  Administered 2023-10-03 – 2023-10-06 (×7): 500 mg via INTRAVENOUS
  Filled 2023-10-03 (×7): qty 100

## 2023-10-03 MED ORDER — ALTEPLASE 2 MG IJ SOLR
2.0000 mg | Freq: Once | INTRAMUSCULAR | Status: DC
Start: 2023-10-03 — End: 2023-10-03
  Filled 2023-10-03: qty 2

## 2023-10-03 NOTE — Evaluation (Signed)
Physical Therapy Evaluation Patient Details Name: [name] MRN: [id number] DOB: Oct 11, 1961 Today's Date: 10/03/2023  History of Present Illness  Pt is a 62 y/o F admitted on 09/30/23 after presenting with chronic R great toe diabetic ulcer, found to have osteomyelitis of R great toe & 5th digit. Pt is s/p biopsy & debridement with podiatry on 11/4. PMH: GERD, endometrial CA s/p hysterectomy, HTN, DM2, peripheral neuropathy  Clinical Impression  Pt seen for PT evaluation with pt agreeable. Pt reports prior to admission she was independent without AD, living alone in a 1 level studio apartment with level entry, using public transportation & working in a warehouse during the day. Pt reports unsteadiness when ambulating with RLE post op shoe donned. PT educated pt on benefits of wearing tennis shoe on LLE & pt able to don without assistance. Pt ambulates without AD with tennis shoe on LLE with reported increased balance but gait pattern as noted below. Gait distance limited by pain so provided pt with RW for pain management & pt able to ambulate with slightly improved balance & activity tolerance. Educated pt on use of RW for endurance/pain management PRN but it's not necessary for mobility & pt voiced understanding. At this time, pt does not require further acute PT services. PT to complete current orders, please re-consult if new needs arise.        If plan is discharge home, recommend the following:     Can travel by private vehicle        Equipment Recommendations Rolling walker (2 wheels)  Recommendations for Other Services       Functional Status Assessment Patient has had a recent decline in their functional status and demonstrates the ability to make significant improvements in function in a reasonable and predictable amount of time.     Precautions / Restrictions Precautions Precautions: None Restrictions Weight Bearing Restrictions: Yes RLE Weight Bearing: Weight bearing as  tolerated (in post op shoe)      Mobility  Bed Mobility               General bed mobility comments: not tested, pt received & left sitting in recliner    Transfers Overall transfer level: Independent Equipment used: None Transfers: Sit to/from Stand Sit to Stand: Independent           General transfer comment: STS from recliner without AD    Ambulation/Gait Ambulation/Gait assistance: Modified independent (Device/Increase time) Gait Distance (Feet): 35 Feet (+ 70 ft) Assistive device: None, Rolling walker (2 wheels) Gait Pattern/deviations: Decreased weight shift to right, Decreased dorsiflexion - left Gait velocity: decreased     General Gait Details: Pt ambulates without AD with mod I with gait pattern as noted above. Pt reports gait distance is limited by R foot pain so provided pt with RW for pain management. Pt is able to ambulate increased distance with RW & mod I, education re: use of AD, & more normalized gait pattern.  Stairs            Wheelchair Mobility     Tilt Bed    Modified Rankin (Stroke Patients Only)       Balance Overall balance assessment: Mild deficits observed, not formally tested   Sitting balance-Leahy Scale: Good     Standing balance support: During functional activity, No upper extremity supported Standing balance-Leahy Scale: Good Standing balance comment: Pt able to carry drink across room while ambulating without AD, no LOB.  Pertinent Vitals/Pain Pain Assessment Pain Assessment: 0-10 Pain Score: 7  Pain Location: R foot Pain Descriptors / Indicators: Discomfort Pain Intervention(s): Monitored during session, Premedicated before session    Home Living Family/patient expects to be discharged to:: Private residence Living Arrangements: Alone Available Help at Discharge: Family;Available PRN/intermittently (daughter works during the day) Type of Home: Apartment Home  Access: Level entry       Home Layout: One level Home Equipment: None      Prior Function Prior Level of Function : Independent/Modified Independent;Driving             Mobility Comments: Ambulatory without AD, denies falls, works in a [employer], uses public transportation (bus)       Extremity/Trunk Assessment   Upper Extremity Assessment Upper Extremity Assessment: Overall WFL for tasks assessed    Lower Extremity Assessment Lower Extremity Assessment: Overall WFL for tasks assessed       Communication   Communication Communication: No apparent difficulties  Cognition Arousal: Alert Behavior During Therapy: WFL for tasks assessed/performed Overall Cognitive Status: Within Functional Limits for tasks assessed                                          General Comments      Exercises     Assessment/Plan    PT Assessment Patient does not need any further PT services  PT Problem List         PT Treatment Interventions      PT Goals (Current goals can be found in the Care Plan section)  Acute Rehab PT Goals Patient Stated Goal: none stated PT Goal Formulation: With patient Time For Goal Achievement: 10/17/23 Potential to Achieve Goals: Good    Frequency       Co-evaluation               AM-PAC PT "6 Clicks" Mobility  Outcome Measure Help needed turning from your back to your side while in a flat bed without using bedrails?: None Help needed moving from lying on your back to sitting on the side of a flat bed without using bedrails?: None Help needed moving to and from a bed to a chair (including a wheelchair)?: None Help needed standing up from a chair using your arms (e.g., wheelchair or bedside chair)?: None Help needed to walk in hospital room?: None Help needed climbing 3-5 steps with a railing? : None 6 Click Score: 24    End of Session Equipment Utilized During Treatment:  (RLE post op shoe) Activity Tolerance:  Patient tolerated treatment well Patient left: in chair;with call bell/phone within reach        Time: 1312-1333 PT Time Calculation (min) (ACUTE ONLY): 21 min   Charges:   PT Evaluation $PT Eval Low Complexity: 1 Low   PT General Charges $$ ACUTE PT VISIT: 1 Visit         Aleda Grana, PT, DPT 10/03/23, 1:40 PM   Sandi Mariscal 10/03/2023, 1:38 PM

## 2023-10-03 NOTE — Progress Notes (Addendum)
Daily Progress Note Intern Pager: [phone]  Patient name: [name] Medical record number: [id number] Date of birth: June 14, 1961 Age: 62 y.o. Gender: female  Primary Care Provider: Porfirio Oar, PA Consultants: podiatry Code Status: full  Pt Overview and Major Events to Date:  11/2: admitted to FMTS 11/4: debridement and bone bx with podiatry  Assessment and Plan: [name] is a 62 y.o female presenting with chronic right great toe diabetic ulcer, found to have osteomyelitis of right great toe and fifth digit. S/p biopsy and debridement with podiatry on 11/4. Wound culture with rare GNC and GNR, will restart broad spectrum ABX today while awaiting culture sensitivities.  Assessment & Plan Osteomyelitis of great toe of right foot (HCC) Evidence of acute osteomyelitis of right distal phalanx of first toe and proximal distal phalanx of fifth toe on MRI. Per podiatry op note, R hallux with significant osteonecrosis that may require partial or total amputation. 5th digit with less concern for infection. Has remained afebrile and pain is well controlled.  -- R great toe wound cx with rare GPC, GNR -- ceftriaxone, metronidazole, vancomycin until susceptibilities result per ID, then reconsult ID to narrow ABX -- R foot WBAT in postop shoe  - Blood cultures with Ng x 2 days - Monitor fever curve - Tylenol as needed for pain -- PT/OT eval and treat  Type 2 diabetes mellitus (HCC) A1c of 6.2, within goal.  Home regimen of 40 units of Lantus daily and sliding scale with meals.  Taking metformin 100 mg twice daily and Ozempic weekly. Got 10u long acting yesterday post-op, was getting 30 units daily prior to this. - restart 30 units long acting insulin daily - CBGs with meals - Moderate sliding scale - Tight control of CBGs due to infection   Chronic and Stable Issues: HLD: continue Lipitor  HTN: Continue Lisinopril 20 mg - holding due to soft pressures today Insomnia:  continue ambien 5 mg B12 deficiency: monthly B12 injections (next scheduled for 11/14) Constipation: bisacodyl 5mg  daily (believe this is what she is taking at home)  FEN/GI: carb modified PPx: lovenox Dispo: pending clinical improvement  Subjective:  Pt states she feels fine today, denies pain. States she has not had a bowel movement recently, takes an over the counter medication at home that helps. Reviewed plan to restart antibiotics today. No questions or concerns.  Objective: Temp:  [97.7 F (36.5 C)-98.9 F (37.2 C)] 98.7 F (37.1 C) (11/05 0737) Pulse Rate:  [77-92] 86 (11/05 0737) Resp:  [14-20] 16 (11/05 0737) BP: (91-122)/(53-69) 119/64 (11/05 0737) SpO2:  [98 %-100 %] 100 % (11/05 0737) Physical Exam: General: sitting in recliner, pleasant and conversant, in NAD Cardiovascular: RRR, normal S1/S2 Respiratory: CTAB, normal WOB on RA Abdomen: normoactive bowel sounds, soft, nontender, nondistended Extremities: R foot in post-op boot with clean and dry dressing  Laboratory: Most recent CBC Lab Results  Component Value Date   WBC 4.2 10/02/2023   HGB 9.8 (L) 10/02/2023   HCT 31.0 (L) 10/02/2023   MCV 101.0 (H) 10/02/2023   PLT 200 10/02/2023   Most recent BMP    Latest Ref Rng & Units 10/02/2023    3:04 AM  BMP  Glucose 70 - 99 mg/dL 88   BUN 8 - 23 mg/dL 13   Creatinine 1.47 - 1.00 mg/dL 8.29   Sodium 562 - 130 mmol/L 141   Potassium 3.5 - 5.1 mmol/L 4.0   Chloride 98 - 111 mmol/L 109  CO2 22 - 32 mmol/L 25   Calcium 8.9 - 10.3 mg/dL 9.0     [name], Debra Ruiz, MD 10/03/2023, 8:12 AM  PGY-1, [facility] FPTS Intern pager: [phone], text pages welcome Secure chat group [facility] Teaching Service

## 2023-10-03 NOTE — Assessment & Plan Note (Addendum)
A1c of 6.2, within goal.  Home regimen of 40 units of Lantus daily and sliding scale with meals.  Taking metformin 100 mg twice daily and Ozempic weekly. Got 10u long acting yesterday post-op, was getting 30 units daily prior to this. - restart 30 units long acting insulin daily - CBGs with meals - Moderate sliding scale - Tight control of CBGs due to infection

## 2023-10-03 NOTE — Progress Notes (Signed)
Pharmacy Antibiotic Note  [name] is a 62 y.o. female admitted on 09/30/2023 who presented with foot pain, hx of osteomyelitis. Vancomycin 1750 mg IV x 1 given in ED 11/03 followed by x1 vancomycin 1000 mg 11/03. Antibiotics held for pending bone biopsy, now resumed. Pharmacy has been consulted for vancomycin dosing. Gram negative coverage per MD.   Plan: Vancomycin 1000 mg IV Q12h (eAUC 471, goal AUC 400-550, Scr 0.8, Vd 0.5) Ceftriaxone 2g IV Q24h & Flagyl 500mg  Q12h per MD Trend WBC, fever, renal function F/u cultures, clinical progress, levels as indicated De-escalate when able  Height: 6\' 1"  (185.4 cm) Weight: 109.8 kg (242 lb) IBW/kg (Calculated) : 75.4  Temp (24hrs), Avg:98.3 F (36.8 C), Min:97.7 F (36.5 C), Max:98.9 F (37.2 C)  Recent Labs  Lab 09/30/23 1542 09/30/23 1552 10/01/23 0300 10/02/23 0304  WBC 5.5  --  3.9* 4.2  CREATININE 0.93  --  0.75 0.77  LATICACIDVEN  --  1.0  --   --     Estimated Creatinine Clearance: 104 mL/min (by C-G formula based on SCr of 0.77 mg/dL).    Allergies  Allergen Reactions   Nsaids Nausea And Vomiting    Acid reflux   Penicillins Other (See Comments)    Did it involve swelling of the face/tongue/throat, SOB, or low BP? Unk Did it involve sudden or severe rash/hives, skin peeling, or any reaction on the inside of your mouth or nose? Unk  Did you need to seek medical attention at a hospital or doctor's office? Unk When did it last happen? Childhood       Has tolerated cephalosporins on several occasions (2023-2024)   Antimicrobials this admission: Vancomycin 11/02 >> 11/03  Microbiology results: 11/02 BCx: ngtd 11/04 Bone biopsy: pending 11/04 R wound cx: GPCs, GNRs   Thank you for allowing pharmacy to be a part of this patient's care.  Thelma Barge, PharmD Clinical Pharmacist

## 2023-10-03 NOTE — Progress Notes (Signed)
   PODIATRY PROGRESS NOTE Patient Name: [name]  DOB [birth year] DOA 09/30/2023  Hospital Day: 4  Assessment:  62 y.o. female with Right hallux ulceration and underlying osteomyelitis POD 1 s/p R foot hallux debridement to bone, bone biopsy, R 5th toe bone biopsy.  AF, VSS  WBC: pending  Wound/Bone Cultures: Pending, GPC, GNR  Imaging: MRI R foot 11/2 1. Acute osteomyelitis of the right great toe distal phalanx. 2. Faint marrow edema within the distal aspect of the great toe proximal phalanx is favored to represent reactive osteitis. 3. Small wound or ulceration along the lateral aspect of the fifth toe near the interphalangeal joint. Acute osteomyelitis of the proximal and distal phalanx of the fifth toe. 4. Diffuse soft tissue swelling and edema throughout the dorsum of the foot. No organized or drainable fluid collections.  Plan:  - s/p R foot hallux ulcer debridement, bone biopsy, 5th toe bone biopsy - No further surgical plans, will be stable for discharge pending infectious disease recommendations of antibiotic therapy based on intraoperative cultures that were taken. Await culture data to allow for targeted abx therapy. ID consult. Appreciate recs.  - Patient likely to ultimately require partial or total Right hallux amputation. This was offered and recommended to her this admission however will attempt abx therapy and wound care per her wishes.  - WBAT in post op shoe to R foot - Will plan to change dressing to R foot tomorrow afternoon Will continue to follow        Corinna Gab, DPM [facility]    Subjective:  Patient doing well says she just started having a little pain in the right great toe but overall managed. No concerns understands plans going forward and questions answered.   Objective:   Vitals:   10/03/23 0506 10/03/23 0737  BP: 122/68 119/64  Pulse: 90 86  Resp: 17 16  Temp: 98.7 F (37.1 C) 98.7 F (37.1 C)  SpO2: 100% 100%        Latest Ref Rng & Units 10/02/2023    3:04 AM 10/01/2023    3:00 AM 09/30/2023    3:42 PM  CBC  WBC 4.0 - 10.5 K/uL 4.2  3.9  5.5   Hemoglobin 12.0 - 15.0 g/dL 9.8  9.6  16.1   Hematocrit 36.0 - 46.0 % 31.0  29.7  31.7   Platelets 150 - 400 K/uL 200  189  215        Latest Ref Rng & Units 10/02/2023    3:04 AM 10/01/2023    3:00 AM 09/30/2023    3:42 PM  BMP  Glucose 70 - 99 mg/dL 88  096  045   BUN 8 - 23 mg/dL 13  10  13    Creatinine 0.44 - 1.00 mg/dL 4.09  8.11  9.14   Sodium 135 - 145 mmol/L 141  140  139   Potassium 3.5 - 5.1 mmol/L 4.0  4.2  4.5   Chloride 98 - 111 mmol/L 109  110  106   CO2 22 - 32 mmol/L 25  25  25    Calcium 8.9 - 10.3 mg/dL 9.0  9.0  9.1     General: AAOx3, NAD  Lower Extremity Exam  Right foot dressing c/d/i Minimal pain on palpation R hallux, 5th toe CFT < 3 sec  Radiology:  Results reviewed. See assessment for pertinent imaging results

## 2023-10-03 NOTE — Plan of Care (Signed)

## 2023-10-03 NOTE — Assessment & Plan Note (Addendum)
Evidence of acute osteomyelitis of right distal phalanx of first toe and proximal distal phalanx of fifth toe on MRI. Per podiatry op note, R hallux with significant osteonecrosis that may require partial or total amputation. 5th digit with less concern for infection. Has remained afebrile and pain is well controlled.  -- R great toe wound cx with rare GPC, GNR -- ceftriaxone, metronidazole, vancomycin until susceptibilities result per ID, then reconsult ID to narrow ABX -- R foot WBAT in postop shoe  - Blood cultures with Ng x 2 days - Monitor fever curve - Tylenol as needed for pain -- PT/OT eval and treat

## 2023-10-03 NOTE — Plan of Care (Signed)
  Problem: Education: Goal: Ability to describe self-care measures that may prevent or decrease complications (Diabetes Survival Skills Education) will improve Outcome: Progressing Goal: Individualized Educational Video(s) Outcome: Progressing   

## 2023-10-04 ENCOUNTER — Encounter (HOSPITAL_COMMUNITY): Payer: Self-pay | Admitting: Podiatry

## 2023-10-04 DIAGNOSIS — L97526 Non-pressure chronic ulcer of other part of left foot with bone involvement without evidence of necrosis: Secondary | ICD-10-CM | POA: Diagnosis not present

## 2023-10-04 DIAGNOSIS — E11621 Type 2 diabetes mellitus with foot ulcer: Secondary | ICD-10-CM | POA: Diagnosis not present

## 2023-10-04 DIAGNOSIS — M869 Osteomyelitis, unspecified: Secondary | ICD-10-CM | POA: Diagnosis not present

## 2023-10-04 LAB — CBC
HCT: 30.4 % — ABNORMAL LOW (ref 36.0–46.0)
Hemoglobin: 9.7 g/dL — ABNORMAL LOW (ref 12.0–15.0)
MCH: 31.6 pg (ref 26.0–34.0)
MCHC: 31.9 g/dL (ref 30.0–36.0)
MCV: 99 fL (ref 80.0–100.0)
Platelets: 192 10*3/uL (ref 150–400)
RBC: 3.07 MIL/uL — ABNORMAL LOW (ref 3.87–5.11)
RDW: 12.2 % (ref 11.5–15.5)
WBC: 3.6 10*3/uL — ABNORMAL LOW (ref 4.0–10.5)
nRBC: 0 % (ref 0.0–0.2)

## 2023-10-04 LAB — BASIC METABOLIC PANEL
Anion gap: 9 (ref 5–15)
BUN: 15 mg/dL (ref 8–23)
CO2: 24 mmol/L (ref 22–32)
Calcium: 8.8 mg/dL — ABNORMAL LOW (ref 8.9–10.3)
Chloride: 103 mmol/L (ref 98–111)
Creatinine, Ser: 0.88 mg/dL (ref 0.44–1.00)
GFR, Estimated: >60 mL/min (ref >60)
Glucose, Bld: 268 mg/dL — ABNORMAL HIGH (ref 70–99)
Potassium: 4.3 mmol/L (ref 3.5–5.1)
Sodium: 136 mmol/L (ref 135–145)

## 2023-10-04 LAB — GLUCOSE, CAPILLARY
Glucose-Capillary: 229 mg/dL — ABNORMAL HIGH (ref 70–99)
Glucose-Capillary: 311 mg/dL — ABNORMAL HIGH (ref 70–99)
Glucose-Capillary: 160 mg/dL — ABNORMAL HIGH (ref 70–99)
Glucose-Capillary: 212 mg/dL — ABNORMAL HIGH (ref 70–99)

## 2023-10-04 LAB — SURGICAL PATHOLOGY

## 2023-10-04 MED ORDER — POLYETHYLENE GLYCOL 3350 17 G PO PACK
17.0000 g | PACK | Freq: Two times a day (BID) | ORAL | Status: DC
  Administered 2023-10-04 (×2): 17 g via ORAL
  Filled 2023-10-04 (×2): qty 1

## 2023-10-04 MED ORDER — SORBITOL 70 % SOLN
960.0000 mL | TOPICAL_OIL | Freq: Once | ORAL | Status: AC
  Administered 2023-10-04: 960 mL via RECTAL
  Filled 2023-10-04: qty 240

## 2023-10-04 MED ORDER — WITCH HAZEL-GLYCERIN EX PADS
MEDICATED_PAD | CUTANEOUS | Status: DC | PRN
  Filled 2023-10-04: qty 100

## 2023-10-04 MED ORDER — GLYCERIN (LAXATIVE) 2 G RE SUPP
1.0000 | Freq: Every day | RECTAL | Status: DC | PRN
  Filled 2023-10-04: qty 1

## 2023-10-04 MED ORDER — INSULIN GLARGINE-YFGN 100 UNIT/ML ~~LOC~~ SOLN
40.0000 [IU] | Freq: Every day | SUBCUTANEOUS | Status: DC
  Administered 2023-10-04 – 2023-10-05 (×2): 40 [IU] via SUBCUTANEOUS
  Filled 2023-10-04 (×3): qty 0.4

## 2023-10-04 NOTE — Assessment & Plan Note (Addendum)
A1c of 6.2, within goal.  Home regimen of 40 units of Lantus daily and sliding scale with meals.  Taking metformin 100 mg twice daily and Ozempic weekly. Got 10u short acting yesterday. AM CBG 229. - increase long acting insulin to 40 units daily - CBGs with meals - Moderate sliding scale - Tight control of CBGs due to infection

## 2023-10-04 NOTE — Progress Notes (Addendum)
Daily Progress Note Intern Pager: [phone]  Patient name: [name] Medical record number: [id number] Date of birth: 1961/09/20 Age: 62 y.o. Gender: female  Primary Care Provider: Porfirio Oar, PA Consultants: podiatry Code Status: full  Pt Overview and Major Events to Date:  11/2: admitted to FMTS 11/4: debridement and bone bx with podiatry  Assessment and Plan: BRIGETTE HOPFER is a 62 y.o female presenting with chronic right great toe diabetic ulcer, found to have osteomyelitis of right great toe and fifth digit. S/p biopsy and debridement with podiatry on 11/4. Wound and bone cultures with GNR, on broad spectrum ABX today while awaiting sensitivities.   Assessment & Plan Osteomyelitis of great toe of right foot (HCC) Evidence of acute osteomyelitis of R great toe and 5th digit. S/p debridement and bone biopsy with podiatry, no plans for further OR. Wound and bone bx cultures with rare GPC, GNR- moderate GBS isolated. Has remained afebrile and pain is well controlled.  -- f/u wound and bone biopsy susceptibilities -- continue ceftriaxone, metronidazole, vancomycin until susceptibilities result per ID, then reconsult ID to narrow ABX -- R foot WBAT in postop shoe, podiatry to change dressing this afternoon -- PT recommended rolling walker, signed off -- No needs per OT, signed off - Monitor fever curve -- home tramadol 50mg  q6 PRN for severe pain - Tylenol as needed for mild pain   Type 2 diabetes mellitus (HCC) A1c of 6.2, within goal.  Home regimen of 40 units of Lantus daily and sliding scale with meals.  Taking metformin 100 mg twice daily and Ozempic weekly. Got 10u short acting yesterday. AM CBG 229. - increase long acting insulin to 40 units daily - CBGs with meals - Moderate sliding scale - Tight control of CBGs due to infection  Chronic and Stable Issues: HLD: continue Lipitor  HTN: holding home Lisinopril 20 mg due to soft pressures Insomnia:  continue ambien 5 mg B12 deficiency: monthly B12 injections (next scheduled for 11/14) Constipation: bisacodyl 5mg  daily, miralax daily PRN, glycerin suppository today  FEN/GI: carb modified PPx: lovenox Dispo: pending clinical improvement  Subjective:  Pt states she does not have any pain in her R foot. States she still has not had a bowel movement despite taking bisacodyl and 2 doses of miralax. Endorses some abdominal discomfort.  Objective: Temp:  [98 F (36.7 C)-99 F (37.2 C)] 98 F (36.7 C) (11/06 0750) Pulse Rate:  [69-80] 71 (11/06 0752) Resp:  [17-18] 18 (11/06 0750) BP: (107-126)/(63-112) 107/63 (11/06 0752) SpO2:  [98 %-100 %] 100 % (11/06 0750) Physical Exam: General: Sitting in recliner, NAD Cardiovascular: RRR, normal S1/S2 Respiratory: CTAB, normal WOB on RA Abdomen: normoactive bowel sounds, soft, nontender, nondistended Extremities: post-op boot on R foot, dressings over wound clean and dry, LLE without edema  Laboratory: Most recent CBC Lab Results  Component Value Date   WBC 3.6 (L) 10/04/2023   HGB 9.7 (L) 10/04/2023   HCT 30.4 (L) 10/04/2023   MCV 99.0 10/04/2023   PLT 192 10/04/2023   Most recent BMP    Latest Ref Rng & Units 10/04/2023    3:17 AM  BMP  Glucose 70 - 99 mg/dL 696   BUN 8 - 23 mg/dL 15   Creatinine 2.95 - 1.00 mg/dL 2.84   Sodium 132 - 440 mmol/L 136   Potassium 3.5 - 5.1 mmol/L 4.3   Chloride 98 - 111 mmol/L 103   CO2 22 - 32 mmol/L 24   Calcium  8.9 - 10.3 mg/dL 8.8     Giordan Fordham, Tacey Ruiz, MD 10/04/2023, 8:11 AM  PGY-1, [facility] FPTS Intern pager: [phone], text pages welcome Secure chat group [facility] Teaching Service

## 2023-10-04 NOTE — Assessment & Plan Note (Addendum)
Evidence of acute osteomyelitis of R great toe and 5th digit. S/p debridement and bone biopsy with podiatry, no plans for further OR. Wound and bone bx cultures with rare GPC, GNR- moderate GBS isolated. Has remained afebrile and pain is well controlled.  -- f/u wound and bone biopsy susceptibilities -- continue ceftriaxone, metronidazole, vancomycin until susceptibilities result per ID, then reconsult ID to narrow ABX -- R foot WBAT in postop shoe, podiatry to change dressing this afternoon -- PT recommended rolling walker, signed off -- No needs per OT, signed off - Monitor fever curve -- home tramadol 50mg  q6 PRN for severe pain - Tylenol as needed for mild pain

## 2023-10-04 NOTE — Anesthesia Postprocedure Evaluation (Signed)
Anesthesia Post Note  Patient: [name]  Procedure(s) Performed: IRRIGATION AND DEBRIDEMENT FOOT, bone biopsy RIGHT GREAT TOE AND RIGHT 5TH TOE (Right: Foot)     Patient location during evaluation: PACU Anesthesia Type: MAC Level of consciousness: awake Pain management: pain level controlled Vital Signs Assessment: post-procedure vital signs reviewed and stable Respiratory status: spontaneous breathing, nonlabored ventilation and respiratory function stable Cardiovascular status: blood pressure returned to baseline and stable Postop Assessment: no apparent nausea or vomiting Anesthetic complications: no   No notable events documented.  Last Vitals:  Vitals:   10/04/23 1451 10/04/23 1930  BP: 107/73 (!) 150/73  Pulse: 82 83  Resp: 18   Temp: 36.7 C 36.9 C  SpO2: 99% 100%    Last Pain:  Vitals:   10/04/23 1930  TempSrc: Oral  PainSc:                  Sayvon Arterberry P Scotti Kosta

## 2023-10-04 NOTE — Inpatient Diabetes Management (Signed)
Inpatient Diabetes Program Recommendations  AACE/ADA: New Consensus Statement on Inpatient Glycemic Control (2015)  Target Ranges:  Prepandial:   less than 140 mg/dL      Peak postprandial:   less than 180 mg/dL (1-2 hours)      Critically ill patients:  140 - 180 mg/dL   Lab Results  Component Value Date   GLUCAP 229 (H) 10/04/2023   HGBA1C 6.2 (H) 10/01/2023    Review of Glycemic Control  Latest Reference Range & Units 10/03/23 07:35 10/03/23 11:19 10/03/23 16:11 10/03/23 21:56 10/04/23 07:14 10/04/23 11:35  Glucose-Capillary 70 - 99 mg/dL 244 (H) 010 (H) 272 (H) 340 (H) 229 (H) 311 (H)   Diabetes history: DM 2 Outpatient Diabetes medications: Humalog 7 units tid, metformin 2000 mg daily at supper, Ozempic 2 mg weekly, Semglee 30 units qhs Current orders for Inpatient glycemic control:  Semglee 30 units qhs Novolog 0-15 units tid  Inpatient Diabetes Program Recommendations:    Note: Pt on meal coverage insulin at home, glucose trends increase after PO intake.  -   Add Novolog 4 units tid meal coverage if eating >50% of meals  Thanks,  Christena Deem RN, MSN, BC-ADM Inpatient Diabetes Coordinator Team Pager [phone] (8a-5p)

## 2023-10-04 NOTE — Evaluation (Signed)
Occupational Therapy Evaluation Patient Details Name: [name] MRN: [id number] DOB: 05/08/1961 Today's Date: 10/04/2023   History of Present Illness Pt is a 62 y/o F admitted on 09/30/23 after presenting with chronic R great toe diabetic ulcer, found to have osteomyelitis of R great toe & 5th digit. Pt is s/p biopsy & debridement with podiatry on 11/4. PMH: GERD, endometrial CA s/p hysterectomy, HTN, DM2, peripheral neuropathy   Clinical Impression   Patient admitted for above and presents near baseline at modified independent level for ADLs, transfers and functional mobility while using RW and post op shoe to R LE. Pt demonstrates good understanding of recommendations, safety and DME, as well as compensatory strategies for ADLs.  Educated on fall prevention techniques. No further OT needs have been identified, pt has no further questions or concerns, and OT will sign off. Thank you for this referral!        If plan is discharge home, recommend the following: Assist for transportation;Assistance with cooking/housework    Functional Status Assessment  Patient has had a recent decline in their functional status and/or demonstrates limited ability to make significant improvements in function in a reasonable and predictable amount of time  Equipment Recommendations  Tub/shower seat (drop arm BSC if pt cannot get tub transfer bench)    Recommendations for Other Services       Precautions / Restrictions Precautions Precautions: None Required Braces or Orthoses: Other Brace Other Brace: post op shoe Restrictions Weight Bearing Restrictions: Yes RLE Weight Bearing: Weight bearing as tolerated Other Position/Activity Restrictions: with post op shoe      Mobility Bed Mobility               General bed mobility comments: OOB Upon entry    Transfers Overall transfer level: Modified independent                 General transfer comment: RW without assist       Balance Overall balance assessment: Mild deficits observed, not formally tested                                         ADL either performed or assessed with clinical judgement   ADL Overall ADL's : Modified independent                                       General ADL Comments: educated on options for tub transfers, pt voiced understanding of DME     Vision   Vision Assessment?: No apparent visual deficits     Perception         Praxis         Pertinent Vitals/Pain Pain Assessment Pain Assessment: Faces Faces Pain Scale: Hurts a little bit Pain Location: R foot Pain Descriptors / Indicators: Discomfort Pain Intervention(s): Limited activity within patient's tolerance, Monitored during session, Repositioned     Extremity/Trunk Assessment Upper Extremity Assessment Upper Extremity Assessment: Overall WFL for tasks assessed   Lower Extremity Assessment Lower Extremity Assessment: Defer to PT evaluation       Communication Communication Communication: No apparent difficulties   Cognition Arousal: Alert Behavior During Therapy: WFL for tasks assessed/performed Overall Cognitive Status: Within Functional Limits for tasks assessed  General Comments       Exercises     Shoulder Instructions      Home Living Family/patient expects to be discharged to:: Private residence Living Arrangements: Alone Available Help at Discharge: Family;Available PRN/intermittently (daughter works during the day) Type of Home: Apartment Home Access: Level entry     Home Layout: One level     Bathroom Shower/Tub: Chief Strategy Officer: Standard     Home Equipment: None          Prior Functioning/Environment Prior Level of Function : Independent/Modified Independent;Driving             Mobility Comments: Ambulatory without AD ADLs Comments: reports several falls  in bathtubm, works in a [employer], uses public transportation (bus)        OT Problem List:        OT Treatment/Interventions:      OT Goals(Current goals can be found in the care plan section) Acute Rehab OT Goals Patient Stated Goal: home OT Goal Formulation: With patient  OT Frequency:      Co-evaluation              AM-PAC OT "6 Clicks" Daily Activity     Outcome Measure Help from another person eating meals?: None Help from another person taking care of personal grooming?: None Help from another person toileting, which includes using toliet, bedpan, or urinal?: None Help from another person bathing (including washing, rinsing, drying)?: None Help from another person to put on and taking off regular upper body clothing?: None Help from another person to put on and taking off regular lower body clothing?: None 6 Click Score: 24   End of Session Equipment Utilized During Treatment: Rolling walker (2 wheels);Other (comment) (post op shoe) Nurse Communication: Mobility status  Activity Tolerance: Patient tolerated treatment well Patient left: in chair;with call bell/phone within reach  OT Visit Diagnosis: Other abnormalities of gait and mobility (R26.89);Pain Pain - Right/Left: Right Pain - part of body: Ankle and joints of foot                Time: 0900-0920 OT Time Calculation (min): 20 min Charges:  OT General Charges $OT Visit: 1 Visit OT Evaluation $OT Eval Low Complexity: 1 Low  Barry Brunner, OT Acute Rehabilitation Services Office [phone]   Chancy Milroy 10/04/2023, 9:26 AM

## 2023-10-04 NOTE — Progress Notes (Signed)
   PODIATRY PROGRESS NOTE Patient Name: [name]  DOB [birth year] DOA 09/30/2023  Hospital Day: 5  Assessment:  62 y.o. female with Right hallux ulceration and underlying osteomyelitis POD 2 s/p R foot hallux debridement to bone, bone biopsy, R 5th toe bone biopsy.  AF, VSS  Path: R hallux + acute OM, no evidence of OM in 5th toe sample  Wound/Bone Cultures:Sens Pending,  Group B strep, GNR.   Plan:  - s/p R foot hallux ulcer debridement, bone biopsy, 5th toe bone biopsy, progressing well without immediate post op concern. Hallux viable at this time, New dsg applied - ID consult for outpatient abx plan, appreciate recs - Dressing change to Right hallux every 3-4 days at home- discussed with patient and demonstrated dsg change. She feels comfortable with this. Recommend home health dressing supplies to include betadine, gauze, xeroform, 4x4 kerlix ace wrap.  - WBAT in post op shoe to R foot -Patient stable for discharge from podiatry standpoint pending ID plan for abx.  - Follow up in office next week Monday or Tuesday in [place] office - scheduler notified via secure chat.        Corinna Gab, DPM [facility]    Subjective:  Patient doing well denies pain, worked with PT and OT and did well. Discussed pathology results. Discussed plan going forward and dressing care/follow up.   Objective:   Vitals:   10/04/23 0750 10/04/23 0752  BP: (!) 126/112 107/63  Pulse: 78 71  Resp: 18   Temp: 98 F (36.7 C)   SpO2: 100%        Latest Ref Rng & Units 10/04/2023    3:17 AM 10/02/2023    3:04 AM 10/01/2023    3:00 AM  CBC  WBC 4.0 - 10.5 K/uL 3.6  4.2  3.9   Hemoglobin 12.0 - 15.0 g/dL 9.7  9.8  9.6   Hematocrit 36.0 - 46.0 % 30.4  31.0  29.7   Platelets 150 - 400 K/uL 192  200  189        Latest Ref Rng & Units 10/04/2023    3:17 AM 10/02/2023    3:04 AM 10/01/2023    3:00 AM  BMP  Glucose 70 - 99 mg/dL 865  88  784   BUN 8 - 23 mg/dL 15  13  10     Creatinine 0.44 - 1.00 mg/dL 6.96  2.95  2.84   Sodium 135 - 145 mmol/L 136  141  140   Potassium 3.5 - 5.1 mmol/L 4.3  4.0  4.2   Chloride 98 - 111 mmol/L 103  109  110   CO2 22 - 32 mmol/L 24  25  25    Calcium 8.9 - 10.3 mg/dL 8.8  9.0  9.0     General: AAOx3, NAD  Lower Extremity Exam  Right hallux viable and ulceration remains closed with suture intact Minimal necrosis, mild maceration, no significant erythema. Edema present Right 5th toe healthy without drainage or erythema/edema   Radiology:  Results reviewed. See assessment for pertinent imaging results

## 2023-10-04 NOTE — Plan of Care (Signed)
  Problem: Education: Goal: Ability to describe self-care measures that may prevent or decrease complications (Diabetes Survival Skills Education) will improve Outcome: Progressing Goal: Individualized Educational Video(s) Outcome: Progressing   

## 2023-10-04 NOTE — Consult Note (Signed)
[facility]                                                                                        Patient Identification: Patient Name: [name] MRN: [id number] Admit Date: 09/30/2023 12:14 PM Today's Date: 10/04/2023 Reason for consult: diabetic foot infection  Requesting provider: Dr Deirdre Priest   Principal Problem:   Osteomyelitis of great toe of right foot [facility]) Active Problems:   Diabetic foot ulcer (HCC)   Type 2 diabetes mellitus (HCC)   Osteomyelitis of fifth toe of right foot (HCC)   Antibiotics:  Vancomycin/ceftriaxone/metronidazole 11/5-c  Lines/Hardware: rt chest port   Assessment # DFU/acute osteomyelitis of rt great toe distal phalanx # DFU/acute osteomyelitis of proximal and distal phalanx of 5th toe  -11/4 S/p I&D, distal phalanx hallux bone biopsy as well as fifth toe middle phalanx bone biopsy.  IV antibiotics were held preoperative to allow for improved intraoperative cultures. 11/4 Or cx GNR, Group B strep - 5/22 ABI -no evidence of significant arterial disease in the lower extremities  Recommendations  - DC Vancomycin, cotinue ceftriaxone and metronidazole  - Fu OR cx ( ID of GNR) including sensi - She reports she can get IV abtx through her port, so will not need separate PICC for IV abtx - Monitor CBC and CMP on abtx - Fu at ID clinic on discharge   Rest of the management as per the primary team. Please call with questions or concerns.  Thank you for the consult  __________________________________________________________________________________________________________ HPI and Hospital Course: 62 year old female with PMH of DM2 with neuropathy, HTN, prior left first and second toe amputation, endometrial cancer presented to the ED on 11/2 with a wound on right great toe since September, followed by podiatry which was initially improving on antibiotics but  recently gotten worse with purulent foul-smelling drainage and swelling.  On admission, denied fevers, chills, nausea vomiting, abdominal pain or diarrhea or GU symptoms.  Denied cough, chest pain or shortness of breath.  She reports last antibiotic was taken at the end of August or early September and has not been taking antibiotics since then.  Denies smoking, alcohol and IVDU.  She reports she had a rough night last night due to fecal impaction, resolved with some resulting nausea/vomiting   At ED afebrile Labs remarkable for albumin 3.2 WBC 5.5, hemoglobin 10.4, A1c 6.2 11/2 MRI acute osteomyelitis of the right great toe distal phalanx, small wound or ulceration along the lateral aspect of the fifth toe near the interphalangeal joint, acute osteomyelitis of the proximal and distal phalanx of the fifth toe  S/p surgical intervention as above   ROS: General- Denies fever, chills, loss of appetite and loss of weight HEENT - Denies headache, blurry vision, neck pain, sinus pain Chest - Denies any chest pain, SOB or cough CVS- Denies any dizziness/lightheadedness, syncopal attacks, palpitations Abdomen- Denies any abdominal pain, hematochezia and diarrhea, nausea+ Neuro - Denies any weakness, numbness, tingling sensation Psych - Denies any changes in mood irritability or depressive symptoms GU- Denies any burning, dysuria, hematuria or increased frequency of urination Skin - denies any rashes/lesions MSK -  denies any joint pain/swelling or restricted ROM   Past Medical History:  Diagnosis Date   Anemia    Asthma 11/01/2010   only when respiratory infections   BPPV (benign paroxysmal positional vertigo) 03/07/2013   Broken teeth 11/02/2016   Callus of toe 07/07/2022   Colon cancer screening 05/05/2020   Diabetes mellitus    since 1983   Dizziness 09/22/2021   Elevated TSH 05/13/2013   Endometrial cancer (HCC)    Gangrene of toe of right foot (HCC) 04/14/2021   GERD (gastroesophageal  reflux disease)    Hand pain, right 03/14/2019   History of radiation therapy    Vagina- 06/13/22-07/11/22-Dr. 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   Qualifier: Diagnosis of  By: Daphine Deutscher FNP, [place]     Osteomyelitis [facility])    Pneumonia    as a child   Wrist pain 04/11/2013   Past Surgical History:  Procedure Laterality Date   ABDOMINAL HYSTERECTOMY     ACHILLES TENDON LENGTHENING Left 08/29/2014   Procedure: Left Achilles Lengthening;  Surgeon: Nadara Mustard, MD;  Location: [facility] OR;  Service: Orthopedics;  Laterality: Left;   AMPUTATION Left 02/19/2013   Procedure: Amputation of Left Great Toe;  Surgeon: Kathryne Hitch, MD;  Location[facility] OR;  Service: Orthopedics;  Laterality: Left;   AMPUTATION Left 08/29/2014   Procedure: Left Foot 2nd and 1st Toe Amputation;  Surgeon: Nadara Mustard, MD;  Location: [facility] OR;  Service: Orthopedics;  Laterality: Left;   CARDIAC CATHETERIZATION     15 years ago   CESAREAN SECTION     x 4   CYSTOSCOPY  03/30/2022   Procedure: CYSTOSCOPY;  Surgeon: Carver Fila, MD;  Location: [facility] ORS;  Service: Gynecology;;   IR IMAGING GUIDED PORT INSERTION  04/19/2022   IRRIGATION AND DEBRIDEMENT FOOT Right 10/02/2023   Procedure: IRRIGATION AND DEBRIDEMENT FOOT, bone biopsy RIGHT GREAT TOE AND RIGHT 5TH TOE;  Surgeon: Pilar Plate, DPM;  Location: [facility] OR;  Service: Orthopedics/Podiatry;  Laterality: Right;   ROBOTIC ASSISTED TOTAL HYSTERECTOMY WITH BILATERAL SALPINGO OOPHERECTOMY Bilateral 03/30/2022   Procedure: XI ROBOTIC ASSISTED TOTAL HYSTERECTOMY WITH BILATERAL SALPINGO-OOPHORECTOMY, MINI LAPAROTOMY;  Surgeon: Carver Fila, MD;  Location: [facility] ORS;  Service: Gynecology;  Laterality: Bilateral;   SENTINEL NODE BIOPSY N/A 03/30/2022   Procedure: SENTINEL NODE BIOPSY;  Surgeon: Carver Fila, MD;  Location: [facility] ORS;   Service: Gynecology;  Laterality: N/A;   TUBAL LIGATION     at one of c-sections     Scheduled Meds:  atorvastatin  80 mg Oral Daily   bisacodyl  5 mg Oral Daily   Chlorhexidine Gluconate Cloth  6 each Topical Daily   enoxaparin (LOVENOX) injection  50 mg Subcutaneous Daily   insulin aspart  0-15 Units Subcutaneous TID WC   insulin glargine-yfgn  40 Units Subcutaneous QHS   polyethylene glycol  17 g Oral BID   pregabalin  75 mg Oral BID   sorbitol, milk of mag, mineral oil, glycerin (SMOG) enema  960 mL Rectal Once   Continuous Infusions:  cefTRIAXone (ROCEPHIN)  IV 2 g (10/04/23 1141)   metronidazole 500 mg (10/04/23 1138)   vancomycin 1,000 mg (10/04/23 1226)   PRN Meds:.acetaminophen **OR** acetaminophen, Glycerin (Adult), mouth rinse, sodium chloride flush, traMADol, zolpidem  Allergies  Allergen Reactions   Nsaids Nausea And Vomiting  Acid reflux   Penicillins Other (See Comments)    Did it involve swelling of the face/tongue/throat, SOB, or low BP? Unk Did it involve sudden or severe rash/hives, skin peeling, or any reaction on the inside of your mouth or nose? Unk  Did you need to seek medical attention at a hospital or doctor's office? Unk When did it last happen? Childhood       Has tolerated cephalosporins on several occasions (2023-2024)   Social History   Socioeconomic History   Marital status: Divorced    Spouse name: Not on file   Number of children: 3   Years of education: Not on file   Highest education level: Not on file  Occupational History   Occupation: Semi retired  Tobacco Use   Smoking status: Former    Current packs/day: 0.00    Types: Cigarettes    Quit date: 11/28/1962    Years since quitting: 60.8   Smokeless tobacco: Never  Vaping Use   Vaping status: Never Used  Substance and Sexual Activity   Alcohol use: No    Alcohol/week: 0.0 standard drinks of alcohol   Drug use: No   Sexual activity: Not Currently  Other Topics Concern    Not on file  Social History Narrative   Not on file   Social Determinants of Health   Financial Resource Strain: Low Risk  (07/12/2023)   Received from [facility]   Overall Financial Resource Strain (CARDIA)    Difficulty of Paying Living Expenses: Not hard at all  Recent Concern: Financial Resource Strain - High Risk (04/26/2023)   Received from [facility]   Overall Financial Resource Strain (CARDIA)    Difficulty of Paying Living Expenses: Hard  Food Insecurity: No Food Insecurity (07/12/2023)   Received from [facility]   Hunger Vital Sign    Worried About Running Out of Food in the Last Year: Never true    Ran Out of Food in the Last Year: Never true  Recent Concern: Food Insecurity - Food Insecurity Present (04/26/2023)   Received from [facility], [facility]   Hunger Vital Sign    Worried About Running Out of Food in the Last Year: Sometimes true    Ran Out of Food in the Last Year: Sometimes true  Transportation Needs: No Transportation Needs (07/12/2023)   Received from [facility] - Transportation    Lack of Transportation (Medical): No    Lack of Transportation (Non-Medical): No  Recent Concern: Transportation Needs - Unmet Transportation Needs (04/26/2023)   Received from [facility], [facility]   [facility] - Transportation    Lack of Transportation (Medical): Yes    Lack of Transportation (Non-Medical): Yes  Physical Activity: Insufficiently Active (07/12/2023)   Received from [facility]   Exercise Vital Sign    Days of Exercise per Week: 2 days    Minutes of Exercise per Session: 10 min  Stress: No Stress Concern Present (07/12/2023)   Received from [facility] - Occupational Stress Questionnaire    Feeling of Stress : Not at all  Social Connections: Socially Integrated (07/12/2023)   Received from [facility]   Social Network    How would you rate your social network  (family, work, friends)?: Good participation with social networks  Intimate Partner Violence: Not At Risk (07/12/2023)   Received from [facility]   HITS    Over the last 12 months how  often did your partner physically hurt you?: 1    Over the last 12 months how often did your partner insult you or talk down to you?: 1    Over the last 12 months how often did your partner threaten you with physical harm?: 1    Over the last 12 months how often did your partner scream or curse at you?: 1   Family History  Problem Relation Age of Onset   Hypertension Mother    Dementia Mother    Heart attack Father    Heart disease Father    Diabetes Father    Hypertension Father    Diabetes Daughter    Colon cancer Neg Hx    Breast cancer Neg Hx    Ovarian cancer Neg Hx    Endometrial cancer Neg Hx    Pancreatic cancer Neg Hx    Prostate cancer Neg Hx    Vitals BP 132/67   Pulse 87   Temp 98.6 F (37 C) (Oral)   Resp 18   Ht 6\' 1"  (1.854 m)   Wt 109.8 kg   LMP 06/23/2014   SpO2 100%   BMI 31.93 kg/m    Physical Exam Constitutional: Adult female sitting in the edge of the bed, nontoxic appearing, appears comfortable    Comments: HEENT WNL  Cardiovascular:     Rate and Rhythm: Normal rate and regular rhythm.     Heart sounds: S1 and S2  Pulmonary:     Effort: Pulmonary effort is normal.     Comments: Normal breath sounds  Abdominal:     Palpations: Abdomen is soft.     Tenderness: Nondistended and nontender  Musculoskeletal:        General: No swelling or tenderness in peripheral joints.  Left foot with first and second toe amputation with no signs of infection.  Right foot is wrapped in a bandage C/D/I with no surrounding cellulitis  Skin:    Comments: No rashes  Neurological:     General: Awake, alert and oriented, grossly nonfocal following commands  Psychiatric:        Mood and Affect: Mood normal.    Pertinent Microbiology Results for orders placed or performed  during the hospital encounter of 09/30/23  Blood culture (routine x 2)     Status: None (Preliminary result)   Collection Time: 09/30/23  3:03 PM   Specimen: BLOOD  Result Value Ref Range Status   Specimen Description BLOOD PORTA CATH  Final   Special Requests   Final    BOTTLES DRAWN AEROBIC AND ANAEROBIC Blood Culture adequate volume   Culture   Final    NO GROWTH 4 DAYS Performed at [facility], [address] [place], [place] [address]    Report Status PENDING  Incomplete  Aerobic/Anaerobic Culture w Gram Stain (surgical/deep wound)     Status: None (Preliminary result)   Collection Time: 10/02/23  4:26 PM   Specimen: PATH Bone biopsy; Tissue  Result Value Ref Range Status   Specimen Description BIOPSY BONE RIGHT TOE  Final   Special Requests RT GREAT TOE SAMPLE A  Final   Gram Stain NO WBC SEEN NO ORGANISMS SEEN   Final   Culture   Final    MODERATE GRAM NEGATIVE RODS FEW GROUP B STREP(S.AGALACTIAE)ISOLATED TESTING AGAINST S. AGALACTIAE NOT ROUTINELY PERFORMED DUE TO PREDICTABILITY OF AMP/PEN/VAN SUSCEPTIBILITY. SUSCEPTIBILITIES TO FOLLOW HOLDING FOR POSSIBLE ANAEROBE Performed at [facility], [address], [place], [place]  [address]    Report Status PENDING  Incomplete  Aerobic/Anaerobic Culture w Gram Stain (surgical/deep wound)     Status: None (Preliminary result)   Collection Time: 10/02/23  4:27 PM   Specimen: PATH Bone biopsy; Tissue  Result Value Ref Range Status   Specimen Description BONE BIOPSY  Final   Special Requests 5TH TOE  Final   Gram Stain NO WBC SEEN NO ORGANISMS SEEN   Final   Culture   Final    RARE GROUP B STREP(S.AGALACTIAE)ISOLATED TESTING AGAINST S. AGALACTIAE NOT ROUTINELY PERFORMED DUE TO PREDICTABILITY OF AMP/PEN/VAN SUSCEPTIBILITY. Performed at [facility], [address] [place], [place] [address]    Report Status PENDING  Incomplete  Aerobic/Anaerobic Culture w Gram Stain (surgical/deep wound)     Status: None  (Preliminary result)   Collection Time: 10/02/23  4:28 PM   Specimen: Path Tissue  Result Value Ref Range Status   Specimen Description WOUND RIGHT TOE  Final   Special Requests RT GREAT TOE HALLUX SWAB SAMPLE C  Final   Gram Stain   Final    RARE WBC PRESENT, PREDOMINANTLY PMN RARE GRAM POSITIVE COCCI RARE GRAM NEGATIVE RODS Performed at [facility], [address], [place], [place] [address]    Culture   Final    MODERATE GRAM NEGATIVE RODS MODERATE GROUP B STREP(S.AGALACTIAE)ISOLATED TESTING AGAINST S. AGALACTIAE NOT ROUTINELY PERFORMED DUE TO PREDICTABILITY OF AMP/PEN/VAN SUSCEPTIBILITY. SUSCEPTIBILITIES TO FOLLOW NO ANAEROBES ISOLATED; CULTURE IN PROGRESS FOR 5 DAYS    Report Status PENDING  Incomplete   Pertinent Lab seen by me:    Latest Ref Rng & Units 10/04/2023    3:17 AM 10/02/2023    3:04 AM 10/01/2023    3:00 AM  CBC  WBC 4.0 - 10.5 K/uL 3.6  4.2  3.9   Hemoglobin 12.0 - 15.0 g/dL 9.7  9.8  9.6   Hematocrit 36.0 - 46.0 % 30.4  31.0  29.7   Platelets 150 - 400 K/uL 192  200  189       Latest Ref Rng & Units 10/04/2023    3:17 AM 10/02/2023    3:04 AM 10/01/2023    3:00 AM  CMP  Glucose 70 - 99 mg/dL 213  88  086   BUN 8 - 23 mg/dL 15  13  10    Creatinine 0.44 - 1.00 mg/dL 5.78  4.69  6.29   Sodium 135 - 145 mmol/L 136  141  140   Potassium 3.5 - 5.1 mmol/L 4.3  4.0  4.2   Chloride 98 - 111 mmol/L 103  109  110   CO2 22 - 32 mmol/L 24  25  25    Calcium 8.9 - 10.3 mg/dL 8.8  9.0  9.0     Pertinent Imagings/Other Imagings Plain films and CT images have been personally visualized and interpreted; radiology reports have been reviewed. Decision making incorporated into the Impression / Recommendations.  DG Foot Complete Right  Result Date: 10/02/2023 CLINICAL DATA:  Postop, osteomyelitis. EXAM: RIGHT FOOT COMPLETE - 3+ VIEW COMPARISON:  Preoperative imaging. FINDINGS: Fragmentation of the distal phalanx of the first which is increased from preoperative  exam and is presumably postsurgical. Expected postsurgical change in the overlying soft tissues. Chronic deformity of the fifth toe proximal interphalangeal joint. IMPRESSION: Fragmentation of the first toe distal phalanx is presumably postsurgical. Expected postsurgical change in the overlying soft tissues. Electronically Signed   By: Narda Rutherford M.D.   On: 10/02/2023  20:18   MR FOOT RIGHT W WO CONTRAST  Result Date: 09/30/2023 CLINICAL DATA:  Right great toe wound EXAM: MRI OF THE RIGHT FOREFOOT WITHOUT AND WITH CONTRAST TECHNIQUE: Multiplanar, multisequence MR imaging of the right forefoot was performed before and after the administration of intravenous contrast. CONTRAST:  10mL GADAVIST GADOBUTROL 1 MMOL/ML IV SOLN COMPARISON:  X-ray 09/30/2023, MRI 04/15/2021 FINDINGS: Bones/Joint/Cartilage Intense bone marrow edema and enhancement throughout the great toe distal phalanx with confluent low T1 bone marrow signal compatible with acute osteomyelitis. Faint marrow edema within the distal aspect of the great toe proximal phalanx with preservation of the fatty T1 marrow signal. Small effusion of the great toe interphalangeal joint. There also abnormal bone marrow signal changes including confluent low T1 signal within the proximal and distal phalanx of the fifth toe. No additional sites of bone marrow edema or marrow replacement. No fracture or dislocation. Ligaments Intact Lisfranc ligament.  Intact collateral ligaments. Muscles and Tendons Denervation changes of the foot musculature.  No tenosynovitis. Soft tissues Small wound or ulceration along the lateral aspect of the fifth toe near the interphalangeal joint. Soft tissue swelling of the great toe. Diffuse soft tissue swelling and edema throughout the dorsum of the foot. No organized or drainable fluid collections. IMPRESSION: 1. Acute osteomyelitis of the right great toe distal phalanx. 2. Faint marrow edema within the distal aspect of the great toe  proximal phalanx is favored to represent reactive osteitis. 3. Small wound or ulceration along the lateral aspect of the fifth toe near the interphalangeal joint. Acute osteomyelitis of the proximal and distal phalanx of the fifth toe. 4. Diffuse soft tissue swelling and edema throughout the dorsum of the foot. No organized or drainable fluid collections. Electronically Signed   By: Duanne Guess D.O.   On: 09/30/2023 20:30   DG Foot 2 Views Right  Result Date: 09/30/2023 CLINICAL DATA:  Great toe wound EXAM: RIGHT FOOT - 2 VIEW COMPARISON:  None Available. FINDINGS: No fracture or dislocation. Soft tissue swelling about the mid and forefoot. Well corticated plantar calcaneal spur. There is some progressive sclerosis of the distal tuft of the distal phalanx of the great toe. Question subtle distal tuft erosive change. IMPRESSION: Soft tissue swelling again seen with some sclerosis now seen of the distal aspect of the distal phalanx of the great toe with subtle erosive changes. Please correlate for an area of osteomyelitis. Confirmatory workup with MRI or bone scan could be considered as clinically appropriate. Electronically Signed   By: Karen Kays M.D.   On: 09/30/2023 13:17    I have personally spent 88 minutes involved in face-to-face and non-face-to-face activities for this patient on the day of the visit. Professional time spent includes the following activities: Preparing to see the patient (review of tests), Obtaining and/or reviewing separately obtained history (admission/discharge record), Performing a medically appropriate examination and/or evaluation , Ordering medications/tests/procedures, referring and communicating with other health care professionals, Documenting clinical information in the EMR, Independently interpreting results (not separately reported), Communicating results to the patient/family/caregiver, Counseling and educating the patient/family/caregiver and Care coordination (not  separately reported).  Electronically signed by:   Plan d/w requesting provider as well as ID pharm D  Of note, portions of this note may have been created with voice recognition software. While this note has been edited for accuracy, occasional wrong-word or 'sound-a-like' substitutions may have occurred due to the inherent limitations of voice recognition software.   Odette Fraction, MD Infectious Disease Physician [facility]  [facility] Pager: [phone]

## 2023-10-05 DIAGNOSIS — M86171 Other acute osteomyelitis, right ankle and foot: Secondary | ICD-10-CM | POA: Diagnosis not present

## 2023-10-05 DIAGNOSIS — R112 Nausea with vomiting, unspecified: Secondary | ICD-10-CM

## 2023-10-05 DIAGNOSIS — M869 Osteomyelitis, unspecified: Secondary | ICD-10-CM | POA: Diagnosis not present

## 2023-10-05 LAB — GLUCOSE, CAPILLARY
Glucose-Capillary: 226 mg/dL — ABNORMAL HIGH (ref 70–99)
Glucose-Capillary: 180 mg/dL — ABNORMAL HIGH (ref 70–99)
Glucose-Capillary: 124 mg/dL — ABNORMAL HIGH (ref 70–99)
Glucose-Capillary: 136 mg/dL — ABNORMAL HIGH (ref 70–99)

## 2023-10-05 LAB — CULTURE, BLOOD (ROUTINE X 2)
Culture: NO GROWTH
Special Requests: ADEQUATE

## 2023-10-05 MED ORDER — ONDANSETRON 4 MG PO TBDP
8.0000 mg | ORAL_TABLET | Freq: Once | ORAL | Status: AC
  Administered 2023-10-05: 8 mg via ORAL
  Filled 2023-10-05: qty 2

## 2023-10-05 MED ORDER — ONDANSETRON HCL 4 MG/2ML IJ SOLN
4.0000 mg | Freq: Four times a day (QID) | INTRAMUSCULAR | Status: DC | PRN
  Administered 2023-10-05 (×2): 4 mg via INTRAVENOUS
  Filled 2023-10-05 (×2): qty 2

## 2023-10-05 MED ORDER — TRAMADOL HCL 50 MG PO TABS
50.0000 mg | ORAL_TABLET | Freq: Once | ORAL | Status: AC
  Administered 2023-10-05: 50 mg via ORAL

## 2023-10-05 MED ORDER — ONDANSETRON 4 MG PO TBDP
4.0000 mg | ORAL_TABLET | Freq: Once | ORAL | Status: AC
  Administered 2023-10-05: 4 mg via ORAL
  Filled 2023-10-05: qty 1

## 2023-10-05 MED ORDER — INSULIN ASPART 100 UNIT/ML IJ SOLN
4.0000 [IU] | Freq: Three times a day (TID) | INTRAMUSCULAR | Status: DC
  Administered 2023-10-06: 4 [IU] via SUBCUTANEOUS

## 2023-10-05 MED ORDER — ACETAMINOPHEN 325 MG PO TABS
650.0000 mg | ORAL_TABLET | Freq: Once | ORAL | Status: AC
  Administered 2023-10-05: 650 mg via ORAL

## 2023-10-05 NOTE — Progress Notes (Signed)
    Durable Medical Equipment  (From admission, onward)           Start     Ordered   10/05/23 0825  For home use only DME Bedside commode  Once       Comments: Drop arm bedside commode  Question:  Patient needs a bedside commode to treat with the following condition  Answer:  Acute osteomyelitis of toe of right foot (HCC)   10/05/23 0825   10/04/23 1205  For home use only DME Walker rolling  Once       Question Answer Comment  Walker: With 5 Inch Wheels   Patient needs a walker to treat with the following condition Acute osteomyelitis of toe of right foot (HCC)      10/04/23 1204

## 2023-10-05 NOTE — Progress Notes (Signed)
Daily Progress Note Intern Pager: [phone]  Patient name: [name] Medical record number: [id number] Date of birth: 1961-05-06 Age: 62 y.o. Gender: female  Primary Care Provider: Porfirio Oar, PA Consultants: podiatry Code Status: full  Pt Overview and Major Events to Date:  11/2: admitted to FMTS 11/4: debridement and bone bx with podiatry  Assessment and Plan: [name] is a 62 y.o female presenting with chronic right great toe diabetic ulcer, found to have osteomyelitis of right great toe and fifth digit. S/p biopsy and debridement with podiatry on 11/4. Wound and bone cultures with group B strep, continue broad spectrum ABX while awaiting sensitivities.   Assessment & Plan Osteomyelitis of great toe of right foot (HCC) Evidence of acute osteomyelitis of R great toe and 5th digit. S/p debridement and bone biopsy with podiatry, no plans for further OR. Wound and bone bx cultures with rare GPC, GNR- moderate GBS isolated, on broad spectrum ABX until susceptibilities result. Has remained afebrile and pain is well controlled.  -- f/u wound and bone biopsy susceptibilities -- continue ceftriaxone, metronidazole, vancomycin until susceptibilities result per ID, then reconsult ID to narrow ABX -- R foot WBAT in postop shoe -- Per podiatry: - Dressing change to Right hallux every 3-4 days at home. Recommend home health dressing supplies to include betadine, gauze, xeroform, 4x4 kerlix ace wrap.  - f/u appt scheduled for Monday 11/11 - stable for d/c pending ID plan for ABX -- PT recommended rolling walker, signed off -- OT recommended drop arm bedside commode, signed off - Monitor fever curve -- home tramadol 50mg  q6 PRN for severe pain - Tylenol as needed for mild pain  Nausea & vomiting Reported last night following passing hard stool. May be due to use of multiple laxatives and SMOG enema in the past few days vs reaction to metronidazole. Benign abdominal exam  and having diarrhea, no concern for obstruction at this time.  -- d/c Miralax and bisacodyl  -- monitor for ongoing symptoms -- PRN zofran Type 2 diabetes mellitus (HCC) A1c of 6.2, within goal.  Home regimen of 40 units of Lantus daily and sliding scale with meals.  Taking metformin 100 mg twice daily and Ozempic weekly. Got 19u short acting yesterday. AM CBG 226. - increase long acting insulin to 40 units daily --add Novolog 4u TID with meals - CBGs with meals - Moderate sliding scale - Tight control of CBGs due to infection   Chronic and Stable Issues: HLD: continue Lipitor  HTN: holding home Lisinopril 20 mg due to soft pressures Insomnia: continue ambien 5 mg B12 deficiency: monthly B12 injections (next scheduled for 11/14) External hemorrhoids: tucks pads PRN  FEN/GI: carb modified PPx: lovenox Dispo: home pending ABX plan  Subjective:  Pt states she had a rough night with nausea and vomiting. States she passed some very hard stool yesterday and has been having loose stools since that. States zofran helped with N/V. Endorses some abdominal discomfort but no overt pain. Denies pain in R foot.   Objective: Temp:  [98 F (36.7 C)-98.6 F (37 C)] 98.3 F (36.8 C) (11/07 0812) Pulse Rate:  [77-87] 77 (11/07 0812) Resp:  [18] 18 (11/07 0812) BP: (107-150)/(67-76) 140/76 (11/07 0812) SpO2:  [99 %-100 %] 100 % (11/07 1478) Physical Exam: General: laying down in bed, pleasant and conversant, in NAD Cardiovascular: RRR, normal S1/S2 Respiratory: CTAB, normal WOB on RA Abdomen: decreased bowel sounds, soft, nontender, nondistended Extremities: R foot with boot and  clean/dry dressings, no edema to BLE  Laboratory: Most recent CBC Lab Results  Component Value Date   WBC 3.6 (L) 10/04/2023   HGB 9.7 (L) 10/04/2023   HCT 30.4 (L) 10/04/2023   MCV 99.0 10/04/2023   PLT 192 10/04/2023   Most recent BMP    Latest Ref Rng & Units 10/04/2023    3:17 AM  BMP  Glucose 70 -  99 mg/dL 161   BUN 8 - 23 mg/dL 15   Creatinine 0.96 - 1.00 mg/dL 0.45   Sodium 409 - 811 mmol/L 136   Potassium 3.5 - 5.1 mmol/L 4.3   Chloride 98 - 111 mmol/L 103   CO2 22 - 32 mmol/L 24   Calcium 8.9 - 10.3 mg/dL 8.8      Benicio Manna, Tacey Ruiz, MD 10/05/2023, 8:20 AM  PGY-1, [place] [facility] FPTS Intern pager: [phone], text pages welcome Secure chat group [facility] Teaching Service

## 2023-10-05 NOTE — Progress Notes (Signed)
Pharmacy Note- Penicillin Allergy Clarification   ASSESSMENT:   PEN-FAST Scoring   Five years or less since last reaction 0  Anaphylaxis/Angioedema OR Severe cutaneous adverse reaction  +2   Treatment required for reaction  +1  Total Score  3 points - Moderate risk of positive penicillin allergy test (20%)    Type of intervention (select all that apply):  None currently- patient suffering from nausea/vomiting secondary to fecal impaction. Not feeling well enough to participate in any challenge/testing. I counseled her to keep testing in mind in the future.   Sharin Mons, PharmD, BCPS, BCIDP Infectious Diseases Clinical Pharmacist Phone: [phone] 10/05/2023 10:39 AM

## 2023-10-05 NOTE — Assessment & Plan Note (Addendum)
Evidence of acute osteomyelitis of R great toe and 5th digit. S/p debridement and bone biopsy with podiatry, no plans for further OR. Wound and bone bx cultures with rare GPC, GNR- moderate GBS isolated, on broad spectrum ABX until susceptibilities result. Has remained afebrile and pain is well controlled.  -- f/u wound and bone biopsy susceptibilities -- continue ceftriaxone, metronidazole, vancomycin until susceptibilities result per ID, then reconsult ID to narrow ABX -- R foot WBAT in postop shoe -- Per podiatry: - Dressing change to Right hallux every 3-4 days at home. Recommend home health dressing supplies to include betadine, gauze, xeroform, 4x4 kerlix ace wrap.  - f/u appt scheduled for Monday 11/11 - stable for d/c pending ID plan for ABX -- PT recommended rolling walker, signed off -- OT recommended drop arm bedside commode, signed off - Monitor fever curve -- home tramadol 50mg  q6 PRN for severe pain - Tylenol as needed for mild pain

## 2023-10-05 NOTE — Inpatient Diabetes Management (Signed)
Inpatient Diabetes Program Recommendations  AACE/ADA: New Consensus Statement on Inpatient Glycemic Control (2015)  Target Ranges:  Prepandial:   less than 140 mg/dL      Peak postprandial:   less than 180 mg/dL (1-2 hours)      Critically ill patients:  140 - 180 mg/dL   Lab Results  Component Value Date   GLUCAP 226 (H) 10/05/2023   HGBA1C 6.2 (H) 10/01/2023    Review of Glycemic Control   Latest Reference Range & Units 10/04/23 07:14 10/04/23 11:35 10/04/23 16:32 10/04/23 20:15 10/05/23 07:42  Glucose-Capillary 70 - 99 mg/dL 528 (H) 413 (H) 244 (H) 212 (H) 226 (H)   Diabetes history: DM 2 Outpatient Diabetes medications: Humalog 7 units tid, metformin 2000 mg daily at supper, Ozempic 2 mg weekly, Semglee 30 units qhs Current orders for Inpatient glycemic control:  Semglee 40 units qhs Novolog 0-15 units tid  Inpatient Diabetes Program Recommendations:    Note: Pt on meal coverage insulin at home, glucose trends increase after PO intake, to prevent hypoglycemia due to basal insulin dose if pt not eating well, add prandial short acting insulin to prevent prandial spikes  -   Add Novolog 4 units tid meal coverage if eating >50% of meals  Thanks,  Christena Deem RN, MSN, BC-ADM Inpatient Diabetes Coordinator Team Pager [phone] (8a-5p)

## 2023-10-05 NOTE — Assessment & Plan Note (Addendum)
A1c of 6.2, within goal.  Home regimen of 40 units of Lantus daily and sliding scale with meals.  Taking metformin 100 mg twice daily and Ozempic weekly. Got 19u short acting yesterday. AM CBG 226. - increase long acting insulin to 40 units daily --add Novolog 4u TID with meals - CBGs with meals - Moderate sliding scale - Tight control of CBGs due to infection

## 2023-10-05 NOTE — Assessment & Plan Note (Addendum)
Reported last night following passing hard stool. May be due to use of multiple laxatives and SMOG enema in the past few days vs reaction to metronidazole. Benign abdominal exam and having diarrhea, no concern for obstruction at this time.  -- d/c Miralax and bisacodyl  -- monitor for ongoing symptoms -- PRN zofran

## 2023-10-05 NOTE — Progress Notes (Signed)
Patient vomited PRN pain medication up. Nurse re administered PRN pian medications after Zofran was effective stopping the vomiting. Patient was able to keep pain medication down.

## 2023-10-06 ENCOUNTER — Other Ambulatory Visit (HOSPITAL_COMMUNITY): Payer: Self-pay

## 2023-10-06 DIAGNOSIS — M869 Osteomyelitis, unspecified: Secondary | ICD-10-CM | POA: Diagnosis not present

## 2023-10-06 LAB — GLUCOSE, CAPILLARY
Glucose-Capillary: 117 mg/dL — ABNORMAL HIGH (ref 70–99)
Glucose-Capillary: 139 mg/dL — ABNORMAL HIGH (ref 70–99)
Glucose-Capillary: 107 mg/dL — ABNORMAL HIGH (ref 70–99)

## 2023-10-06 MED ORDER — ERTAPENEM IV (FOR PTA / DISCHARGE USE ONLY): 1.0000 g | INTRAVENOUS | 0 refills | Status: AC

## 2023-10-06 MED ORDER — SODIUM CHLORIDE 0.9 % IV SOLN
1.0000 g | INTRAVENOUS | Status: DC
  Administered 2023-10-06: 1 g via INTRAVENOUS
  Filled 2023-10-06: qty 1000

## 2023-10-06 MED ORDER — HEPARIN SOD (PORK) LOCK FLUSH 100 UNIT/ML IV SOLN: 500.0000 [IU] | INTRAVENOUS | Status: DC | PRN

## 2023-10-06 MED ORDER — ZOLPIDEM TARTRATE 5 MG PO TABS
5.0000 mg | ORAL_TABLET | Freq: Every evening | ORAL | 0 refills | Status: DC | PRN
  Filled 2023-10-06: qty 15, 15d supply, fill #0

## 2023-10-06 NOTE — Progress Notes (Signed)
PHARMACY CONSULT NOTE FOR:  OUTPATIENT  PARENTERAL ANTIBIOTIC THERAPY (OPAT)  Indication: Osteomyelitis of R great toe Regimen: Ertapenem 1 g Q24h End date: 10/30/23  IV antibiotic discharge orders are pended. To discharging provider:  please sign these orders via discharge navigator,  Select New Orders & click on the button choice - Manage This Unsigned Work.     Thank you for allowing pharmacy to be a part of this patient's care.  Lennie Muckle, PharmD PGY1 Pharmacy Resident 10/06/2023 11:39 AM

## 2023-10-06 NOTE — Progress Notes (Signed)
Daily Progress Note Intern Pager: [phone]  Patient name: [name] Medical record number: [id number] Date of birth: August 09, 1961 Age: 62 y.o. Gender: female  Primary Care Provider: Porfirio Oar, PA Consultants: podiatry Code Status: full  Pt Overview and Major Events to Date:  11/2: admitted to FMTS 11/4: debridement and bone bx with podiatry  Assessment and Plan: [name] is a 62 y.o female presenting with chronic right great toe diabetic ulcer, found to have osteomyelitis of right great toe and fifth digit. Stable for discharge with ABX plan per ID.  Assessment & Plan Osteomyelitis of great toe of right foot (HCC) Acute osteomyelitis of R great toe and 5th digit. S/p debridement and bone biopsy with podiatry. Wound and blood cultures now with enterobacter cloacae and group B strep with susceptibilities. Has remained afebrile and pain is well controlled.  -- on ertapenem per ID -- R foot WBAT in postop shoe -- Per podiatry: - Dressing change to Right hallux every 3-4 days at home. Recommend home health dressing supplies to include betadine, gauze, xeroform, 4x4 kerlix ace wrap.  - f/u appt scheduled for Monday 11/11 -- PT recommended rolling walker, signed off -- OT recommended drop arm bedside commode, signed off - Monitor fever curve -- home tramadol 50mg  q6 PRN for severe pain - Tylenol as needed for mild pain  Type 2 diabetes mellitus (HCC) A1c of 6.2, within goal.  Home regimen of 40 units of Lantus daily and sliding scale with meals.  Taking metformin 100 mg twice daily and Ozempic weekly. Got 19u short acting yesterday. AM CBG 117. - increase long acting insulin to 40 units daily --Novolog 4u TID with meals - CBGs with meals - Moderate sliding scale - Tight control of CBGs due to infection Nausea & vomiting (Resolved: 10/06/2023) Reported last night following passing hard stool. May be due to use of multiple laxatives and SMOG enema in the past few  days vs reaction to metronidazole. Benign abdominal exam and having diarrhea, no concern for obstruction at this time.  -- d/c Miralax and bisacodyl  -- monitor for ongoing symptoms -- PRN zofran   Chronic and Stable Issues: HLD: continue Lipitor  HTN: holding home Lisinopril 20 mg due to soft pressures Insomnia: continue ambien 5 mg B12 deficiency: monthly B12 injections (next scheduled for 11/14) External hemorrhoids: tucks pads PRN  FEN/GI: carb modified PPx: lovenox Dispo: home pending ABX plan  Subjective:  Pt states she feels fine today, denies ongoing nausea/vomiting/abd pain. Expresses frustration with prolonged hospital stay. Denies pain. Reviewed plan to d/c home as soon as ABX plan is made.  Objective: Temp:  [98.4 F (36.9 C)-98.8 F (37.1 C)] 98.4 F (36.9 C) (11/08 0748) Pulse Rate:  [83-97] 83 (11/08 0748) Resp:  [16-20] 18 (11/08 0748) BP: (112-126)/(69-75) 126/69 (11/08 0748) SpO2:  [98 %-100 %] 98 % (11/08 0748) Physical Exam: General: sitting on side of bed, pleasant and conversant, in NAD Cardiovascular: RRR, normal S1/S2 Respiratory: CTAB, normal WOB on RA Abdomen: normoactive bowel sounds, soft, nontender, nondistended Extremities: R foot in post-op shoe and dressings and clean/dry, no edema to BLE  Laboratory: Most recent CBC Lab Results  Component Value Date   WBC 3.6 (L) 10/04/2023   HGB 9.7 (L) 10/04/2023   HCT 30.4 (L) 10/04/2023   MCV 99.0 10/04/2023   PLT 192 10/04/2023   Most recent BMP    Latest Ref Rng & Units 10/04/2023    3:17 AM  BMP  Glucose 70 - 99 mg/dL 440   BUN 8 - 23 mg/dL 15   Creatinine 3.47 - 1.00 mg/dL 4.25   Sodium 956 - 387 mmol/L 136   Potassium 3.5 - 5.1 mmol/L 4.3   Chloride 98 - 111 mmol/L 103   CO2 22 - 32 mmol/L 24   Calcium 8.9 - 10.3 mg/dL 8.8      Maire Govan, Tacey Ruiz, MD 10/06/2023, 8:45 AM  PGY-1, [place] [facility] FPTS Intern pager: [phone], text pages welcome Secure chat group [facility] Teaching Service

## 2023-10-06 NOTE — TOC Transition Note (Addendum)
Transition of Care [facility]) - CM/SW Discharge Note   Patient Details  Name: [name] MRN: [id number] Date of Birth: 04-30-1961  [facility]) CM/SW Contact:  Epifanio Lesches, RN Phone Number: 10/06/2023, 1:55 PM   Clinical Narrative:    Patient will DC to: home Anticipated DC date: 10/06/2023 Family notified:  yes Transport by: Benedetto Goad  S/p IRRIGATION AND DEBRIDEMENT FOOT, bone biopsy RIGHT GREAT TOE AND RIGHT 5TH TOE ( right foot),11/4  Per MD patient ready for DC today. RN, and  patient aware of DC. Pt requiring LT IV ABX therapy. Pt agreeable. States has done home infusion in the past. Pt without provider preference. Referral made with [facility] /[facility] and accepted. [facility] will provide HHRN.  Pam to do home infusion teaching @ bedside prior to d/c. Referral made with [facility] for RW and drop arm BSC. Equipment will be delivered to bedside prior to d/c.  Pt without RX med concerns . Post hospital f/u noted on AVS. Pt states will UBER home once d/c ready.  RNCM will sign off for now as intervention is no longer needed. Please consult [name] again if new needs arise.    Final next level of care: Home w Home Health Services Barriers to Discharge: No Barriers Identified   Patient Goals and CMS Choice   Choice offered to / list presented to : Patient  Discharge Placement                         Discharge Plan and Services Additional resources added to the After Visit Summary for                  DME Arranged: Other see comment ([facility], IV ABX therapy) DME Agency: Other - Comment ([facility]) Date DME Agency Contacted: 10/05/23 Time DME Agency Contacted: 1551 Representative spoke with at DME Agency: Ian Malkin HH Arranged: RN [facility]) HH Agency: [facility] Date HH Agency Contacted: 10/06/23 Time HH Agency Contacted: 1348 Representative spoke with at [facility] Agency: Pam  Social Determinants of Health (SDOH)  Interventions SDOH Screenings   Food Insecurity: No Food Insecurity (07/12/2023)   Received from [facility]  Recent Concern: Food Insecurity - Food Insecurity Present (04/26/2023)   Received from [facility]  Housing: Low Risk  (12/23/2022)  Transportation Needs: No Transportation Needs (07/12/2023)   Received from [facility]  Recent Concern: Transportation Needs - Unmet Transportation Needs (04/26/2023)   Received from [facility]  Utilities: Not At Risk (07/12/2023)   Received from [facility]  Recent Concern: Utilities - At Risk (04/26/2023)   Received from [facility]  Depression [phone]): Low Risk  (12/23/2022)  Financial Resource Strain: Low Risk  (07/12/2023)   [facility]  Recent Concern: Financial Resource Strain - High Risk (04/26/2023)   Received from [facility]  Physical Activity: Insufficiently Active (07/12/2023)   Received from [facility]  Social Connections: Socially Integrated (07/12/2023)   Received from [facility]  Stress: No Stress Concern Present (07/12/2023)   Received from [facility]  Tobacco Use: Medium Risk (10/02/2023)     Readmission Risk Interventions     No data to display

## 2023-10-06 NOTE — Progress Notes (Signed)
Mobility Specialist: Progress Note   10/06/23 1540  Mobility  Activity Ambulated independently in hallway  Level of Assistance Independent  Assistive Device None  Distance Ambulated (ft) 550 ft  RLE Weight Bearing WBAT  Activity Response Tolerated well  Mobility Referral Yes  $Mobility charge 1 Mobility  Mobility Specialist Start Time (ACUTE ONLY) 1315  Mobility Specialist Stop Time (ACUTE ONLY) 1329  Mobility Specialist Time Calculation (min) (ACUTE ONLY) 14 min    Pt was agreeable to mobility session - received in chair. No complaints. Ind throughout. Ambulated with post op shoe. Returned to room without fault. Left in bed with all needs met, call bell in reach.   Maurene Capes Mobility Specialist Please contact via SecureChat or Rehab office at [phone]

## 2023-10-06 NOTE — Assessment & Plan Note (Signed)
 Reported last night following passing hard stool. May be due to use of multiple laxatives and SMOG enema in the past few days vs reaction to metronidazole. Benign abdominal exam and having diarrhea, no concern for obstruction at this time.  -- d/c Miralax and bisacodyl  -- monitor for ongoing symptoms -- PRN zofran

## 2023-10-06 NOTE — Discharge Instructions (Signed)
Dear [name],  Thank you for letting us participate in your care. You were hospitalized for an infection in your foot and diagnosed with Osteomyelitis of great toe of right foot (HCC).  We biopsied your foot to grow the bacteria that was causing the infection.  Have now started on IV antibiotics that will treat this infection.  POST-HOSPITAL & CARE INSTRUCTIONS Make sure to follow your infectious disease nurses recommendations. Please make sure to follow-up with podiatry Go to your follow up appointments (listed below)   DOCTOR'S APPOINTMENT   Future Appointments  Date Time Provider Department Center  10/09/2023  3:15 PM Louann Sjogren, DPM [facility]  10/12/2023 12:40 PM Artis Delay, MD [facility] None  10/12/2023  1:15 PM [facility] None  10/30/2023  2:30 PM Odette Fraction, MD [facility]    Follow-up Information     Debra Oar, PA Follow up.   Specialty: Family Medicine Contact information: [address] [place] [address] [phone]                 Take care and be well!  Family Medicine Teaching Service Inpatient Team [place]  [facility]  [address] [place], [place] [address] [phone]

## 2023-10-06 NOTE — Assessment & Plan Note (Addendum)
A1c of 6.2, within goal.  Home regimen of 40 units of Lantus daily and sliding scale with meals.  Taking metformin 100 mg twice daily and Ozempic weekly. Got 19u short acting yesterday. AM CBG 117. - increase long acting insulin to 40 units daily --Novolog 4u TID with meals - CBGs with meals - Moderate sliding scale - Tight control of CBGs due to infection

## 2023-10-06 NOTE — Progress Notes (Signed)
Pt has DC order, AVS was given and explained to pt, all questions were answered. Pt's DME (walker and BSC) were both delivered at bedside. [facility] nurse) was able to finished the teaching for the IV infusion. IV team gave the Heparin lock and re-accessed the port. Pending transport, daughter will provide transport.

## 2023-10-06 NOTE — Assessment & Plan Note (Addendum)
Acute osteomyelitis of R great toe and 5th digit. S/p debridement and bone biopsy with podiatry. Wound and blood cultures now with enterobacter cloacae and group B strep with susceptibilities. Has remained afebrile and pain is well controlled.  -- on ertapenem per ID -- R foot WBAT in postop shoe -- Per podiatry: - Dressing change to Right hallux every 3-4 days at home. Recommend home health dressing supplies to include betadine, gauze, xeroform, 4x4 kerlix ace wrap.  - f/u appt scheduled for Monday 11/11 -- PT recommended rolling walker, signed off -- OT recommended drop arm bedside commode, signed off - Monitor fever curve -- home tramadol 50mg  q6 PRN for severe pain - Tylenol as needed for mild pain

## 2023-10-06 NOTE — Discharge Summary (Cosign Needed Addendum)
Family Medicine Teaching [facility] Discharge Summary  Patient name: [name] Medical record number: [id number] Date of birth: 25-Dec-1960 Age: 62 y.o. Gender: female Date of Admission: 09/30/2023  Date of Discharge: 10/06/23 Admitting Physician: Celine Mans, MD  Primary Care Provider: Porfirio Oar, PA Consultants: podiatry, ID  Indication for Hospitalization: R toe osteomyelitis  Discharge Diagnoses/Problem List:  Principal Problem for Admission: R toe osteomyelitis Other Problems addressed during stay:  Principal Problem:   Osteomyelitis of great toe of right foot [facility]) Active Problems:   Diabetic foot ulcer (HCC)   Type 2 diabetes mellitus (HCC)   Osteomyelitis of fifth toe of right foot [facility])    Brief Hospital Course:  JALENA Rivera is a 63 y.o.female with a history of endometrial cancer, GERD, HTN, T2DM, and peripheral neuropathy who was admitted to the [facility] Medicine Teaching Service at [facility] for R great toe ulcer. Her hospital course is detailed below:   R foot osteomyelitis Presented with worsening R great toe wound, concern for osteomyelitis. Started vanc and ceftriaxone. MRI showed acute osteomyelitis of R great toe distal phalanx and proximal/distal 5th toe phalanx. Podiatry consulted, recommended debridement and bone biopsy. ID was consulted and recommended broad spectrum coverage with Vanc, ceftriaxone, and metronidazole prior to sensitivities. Switched to ertapenem to cover for GBS and Enterobacter that grew in cultures. Discharged with IV ertapenem 1g daily until 10/30/23 (pt has port).  T2DM Blood sugars were well-controlled on long-acting insulin, meal time short-acting insulin, and SSI throughout hospitalization. Held home metformin and ozempic.  Other chronic conditions were medically managed with home medications and formulary alternatives as necessary.  PCP Follow-up Recommendations: Consider discontinuing Ambien Podiatry  follow-up Dressing change to Right hallux every 3-4 days at home, home health dressing supplies to include betadine, gauze, xeroform, 4x4 kerlix ace wrap.  Discuss restarting Lisinopril, held due to soft pressures   Disposition: home  Discharge Condition: stable  Discharge Exam:  Vitals:   10/06/23 0439 10/06/23 0748  BP: 112/71 126/69  Pulse: 97 83  Resp: 18 18  Temp: 98.6 F (37 C) 98.4 F (36.9 C)  SpO2: 99% 98%   General: sitting on side of bed, pleasant and conversant, in NAD Cardiovascular: RRR, normal S1/S2 Respiratory: CTAB, normal WOB on RA Abdomen: normoactive bowel sounds, soft, nontender, nondistended Extremities: R foot in post-op shoe and dressings and clean/dry, no edema to BLE  Significant Procedures: I&D R foot and bone biopsy   Significant Labs and Imaging:     Latest Ref Rng & Units 10/04/2023    3:17 AM 10/02/2023    3:04 AM 10/01/2023    3:00 AM  BMP  Glucose 70 - 99 mg/dL 010  88  272   BUN 8 - 23 mg/dL 15  13  10    Creatinine 0.44 - 1.00 mg/dL 5.36  6.44  0.34   Sodium 135 - 145 mmol/L 136  141  140   Potassium 3.5 - 5.1 mmol/L 4.3  4.0  4.2   Chloride 98 - 111 mmol/L 103  109  110   CO2 22 - 32 mmol/L 24  25  25    Calcium 8.9 - 10.3 mg/dL 8.8  9.0  9.0      MRI R foot 1. Acute osteomyelitis of the right great toe distal phalanx. 2. Faint marrow edema within the distal aspect of the great toe proximal phalanx is favored to represent reactive osteitis. 3. Small wound or ulceration along the lateral aspect of the fifth toe  near the interphalangeal joint. Acute osteomyelitis of the proximal and distal phalanx of the fifth toe. 4. Diffuse soft tissue swelling and edema throughout the dorsum of the foot. No organized or drainable fluid collections.  Results/Tests Pending at Time of Discharge: none  Discharge Medications:  Allergies as of 10/06/2023       Reactions   Nsaids Nausea And Vomiting   Acid reflux   Penicillins Other (See  Comments)   Did it involve swelling of the face/tongue/throat, SOB, or low BP? Unk Did it involve sudden or severe rash/hives, skin peeling, or any reaction on the inside of your mouth or nose? Unk  Did you need to seek medical attention at a hospital or doctor's office? Unk When did it last happen? Childhood      Has tolerated cephalosporins on several occasions (2023-2024)        Medication List     STOP taking these medications    lisinopril 20 MG tablet Commonly known as: ZESTRIL       TAKE these medications    Accu-Chek Guide test strip Generic drug: glucose blood Check blood sugar 4 times per day   Accu-Chek Softclix Lancets lancets CHECK BLOOD SUGAR 4 TIMES A DAY BEFORE MEALS AND BEDTIME   acetaminophen 650 MG CR tablet Commonly known as: TYLENOL Take 1,300-1,950 mg by mouth every 8 (eight) hours as needed for pain.   atorvastatin 80 MG tablet Commonly known as: Lipitor Take 1 tablet (80 mg total) by mouth daily.   cyanocobalamin 1000 MCG/ML injection Commonly known as: VITAMIN B12 Inject 1 mL (1,000 mcg total) into the muscle every 30 (thirty) days.   ertapenem IVPB Commonly known as: INVANZ Inject 1 g into the vein daily for 24 days. Indication:  Osteomyelitis of R great toe First Dose: Yes Last Day of Therapy:  10/30/23 Labs - Once weekly:  CBC/D and BMP, Labs - Once weekly: ESR and CRP Method of administration: Mini-Bag Plus / Gravity Method of administration may be changed at the discretion of home infusion pharmacist based upon assessment of the patient and/or caregiver's ability to self-administer the medication ordered.   insulin lispro 100 UNIT/ML injection Commonly known as: HUMALOG Inject 0.07 mLs (7 Units total) into the skin 3 (three) times daily with meals.   Insulin Syringe-Needle U-100 31G X 15/64" 0.5 ML Misc 1 Syringe by Does not apply route 3 (three) times daily.   meclizine 25 MG tablet Commonly known as: ANTIVERT Take 1 tablet  (25 mg total) by mouth 2 (two) times daily as needed for dizziness.   metFORMIN 500 MG 24 hr tablet Commonly known as: GLUCOPHAGE-XR Take 4 tablets (2,000 mg total) by mouth at bedtime.   Ozempic (2 MG/DOSE) 8 MG/3ML Sopn Generic drug: Semaglutide (2 MG/DOSE) Inject 2 mg into the skin once a week.   pregabalin 75 MG capsule Commonly known as: Lyrica Take 1 capsule (75 mg total) by mouth 2 (two) times daily. What changed:  how much to take when to take this   Semglee (yfgn) 100 UNIT/ML Pen Generic drug: insulin glargine-yfgn Inject 30 Units into the skin every evening.   traMADol 50 MG tablet Commonly known as: ULTRAM Take 50 mg by mouth every 6 (six) hours as needed for severe pain (pain score 7-10).   zolpidem 10 MG tablet Commonly known as: AMBIEN Take 10 mg by mouth at bedtime as needed for sleep. What changed: Another medication with the same name was removed. Continue taking this medication, and follow  the directions you see here.               Durable Medical Equipment  (From admission, onward)           Start     Ordered   10/05/23 0825  For home use only DME Bedside commode  Once       Comments: Drop arm bedside commode  Question:  Patient needs a bedside commode to treat with the following condition  Answer:  Acute osteomyelitis of toe of right foot (HCC)   10/05/23 0825   10/04/23 1205  For home use only DME Walker rolling  Once       Question Answer Comment  Walker: With 5 Inch Wheels   Patient needs a walker to treat with the following condition Acute osteomyelitis of toe of right foot (HCC)      10/04/23 1204              Discharge Care Instructions  (From admission, onward)           Start     Ordered   10/06/23 0000  Change dressing on IV access line weekly and PRN  (Home infusion instructions - [facility] )        10/06/23 1247            Discharge Instructions: Please refer to Patient Instructions section of  EMR for full details.  Patient was counseled important signs and symptoms that should prompt return to medical care, changes in medications, dietary instructions, activity restrictions, and follow up appointments.   Follow-Up Appointments:  Follow-up Information     Porfirio Oar, PA Follow up.   Specialty: Family Medicine Contact information: [address] [place] [address] [phone]                 Celine Mans, MD 10/06/2023, 2:22 PM PGY-1, [facility] Family Medicine  I have verified that the resident's  findings are accurately documented in the resident's note. I have made edits and changes where appropriate, and agree with plan.   Celine Mans, MD, PGY-2 [facility] Family Medicine 2:22 PM 10/06/2023

## 2023-10-06 NOTE — Progress Notes (Addendum)
ID brief note   Remains afebrile Improved GI complaints  11/4 cx with group B streptococcus, Enterobacter cloacae  Will change IV abtx to IV ertapenem, plan for 4 weeks ( EOT 10/30/23)  then switch to PO bactrim/Cipro+ cefadroxil for last 2 weeks ( EOT 11/13/23)  She reports she can get IV abtx through port and will not need another line   OPAT  Diagnosis: DFU/osteomyelitis  Culture Result: group B streptococcus, Enterobacter cloacae  Allergies  Allergen Reactions   Nsaids Nausea And Vomiting    Acid reflux   Penicillins Other (See Comments)    Did it involve swelling of the face/tongue/throat, SOB, or low BP? Unk Did it involve sudden or severe rash/hives, skin peeling, or any reaction on the inside of your mouth or nose? Unk  Did you need to seek medical attention at a hospital or doctor's office? Unk When did it last happen? Childhood       Has tolerated cephalosporins on several occasions (2023-2024)    OPAT Orders Discharge antibiotics to be given via PICC line Discharge antibiotics: ertapenem 1g iv daily to be followed by PO abtx Per pharmacy protocol  Aim for Vancomycin trough 15-20 or AUC 400-550 (unless otherwise indicated) Duration: 4 weeks  End Date: 10/30/23  [facility] Care Per Protocol:  Home health RN for IV administration and teaching; PICC line care and labs.    Labs weekly while on IV antibiotics: X_ CBC with differential X__BMP __ CMP X__ CRP X__ ESR __ Vancomycin trough __ CK  __ Please pull PIC at completion of IV antibiotics X__ Please leave PIC in place until doctor has seen patient or been notified  Fax weekly labs to [phone]  Clinic Follow Up Appt: 12/2 at 2: 30 pm   Odette Fraction, MD Infectious Disease Physician [facility] [address] [place], [place] [address] Phone: [phone]  Fax: [phone]

## 2023-10-07 LAB — AEROBIC/ANAEROBIC CULTURE W GRAM STAIN (SURGICAL/DEEP WOUND): Gram Stain: NONE SEEN

## 2023-10-08 LAB — AEROBIC/ANAEROBIC CULTURE W GRAM STAIN (SURGICAL/DEEP WOUND): Gram Stain: NONE SEEN

## 2023-10-09 ENCOUNTER — Encounter: Payer: Self-pay | Admitting: Podiatry

## 2023-10-09 ENCOUNTER — Ambulatory Visit: Payer: Medicaid Other | Admitting: Podiatry

## 2023-10-09 DIAGNOSIS — E1140 Type 2 diabetes mellitus with diabetic neuropathy, unspecified: Secondary | ICD-10-CM

## 2023-10-09 DIAGNOSIS — E08621 Diabetes mellitus due to underlying condition with foot ulcer: Secondary | ICD-10-CM

## 2023-10-09 DIAGNOSIS — L97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 exposed: Secondary | ICD-10-CM

## 2023-10-09 NOTE — Progress Notes (Signed)
Subjective:  [name], female    DOB: 1961/11/07,  MRN: [id number]  Chief Complaint  Patient presents with   Routine Post Op    DOS: 10/02/23 Procedure: Right great and right fifth toe irrigation and debridement with bone biopsy - Dr. Annamary Rummage  "Its been fine"    DOS: 10/02/23 Procedure: Right great and right fifth toe irrigation and debridement with bone biopsy. With Dr. Annamary Rummage   62 y.o. female returns for POV#1. Relates doing well. Has been receiving antibiotics through her port. Getting ertapenem for 4 weeks than will be on PO barctrim cipro and cefadroxil  Review of Systems: Negative except as noted in the HPI. Denies N/V/F/Ch.  Past Medical History:  Diagnosis Date   Anemia    Asthma 11/01/2010   only when respiratory infections   BPPV (benign paroxysmal positional vertigo) 03/07/2013   Broken teeth 11/02/2016   Callus of toe 07/07/2022   Colon cancer screening 05/05/2020   Diabetes mellitus    since 1983   Dizziness 09/22/2021   Elevated TSH 05/13/2013   Endometrial cancer (HCC)    Gangrene of toe of right foot (HCC) 04/14/2021   GERD (gastroesophageal reflux disease)    Hand pain, right 03/14/2019   History of radiation therapy    Vagina- 06/13/22-07/11/22-Dr. 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   Qualifier: Diagnosis of  By: Daphine Deutscher FNP, [place]     Osteomyelitis [facility])    Pneumonia    as a child   Wrist pain 04/11/2013    Current Outpatient Medications:    Accu-Chek Softclix Lancets lancets, CHECK BLOOD SUGAR 4 TIMES A DAY BEFORE MEALS AND BEDTIME, Disp: 204 each, Rfl: 10   acetaminophen (TYLENOL) 650 MG CR tablet, Take 1,300-1,950 mg by mouth every 8 (eight) hours as needed for pain., Disp: , Rfl:    atorvastatin (LIPITOR) 80 MG tablet, Take 1 tablet (80 mg total) by mouth daily., Disp: 90 tablet, Rfl: 3    cyanocobalamin (VITAMIN B12) 1000 MCG/ML injection, Inject 1 mL (1,000 mcg total) into the muscle every 30 (thirty) days., Disp: 1 mL, Rfl: 11   ertapenem (INVANZ) IVPB, Inject 1 g into the vein daily for 24 days. Indication:  Osteomyelitis of R great toe First Dose: Yes Last Day of Therapy:  10/30/23 Labs - Once weekly:  CBC/D and BMP, Labs - Once weekly: ESR and CRP Method of administration: Mini-Bag Plus / Gravity Method of administration may be changed at the discretion of home infusion pharmacist based upon assessment of the patient and/or caregiver's ability to self-administer the medication ordered., Disp: 24 Units, Rfl: 0   glucose blood (ACCU-CHEK GUIDE) test strip, Check blood sugar 4 times per day, Disp: 125 each, Rfl: 12   insulin lispro (HUMALOG) 100 UNIT/ML injection, Inject 0.07 mLs (7 Units total) into the skin 3 (three) times daily with meals., Disp: 10 mL, Rfl: 3   Insulin Syringe-Needle U-100 31G X 15/64" 0.5 ML MISC, 1 Syringe by Does not apply route 3 (three) times daily., Disp: 50 each, Rfl: 3   meclizine (ANTIVERT) 25 MG tablet, Take 1 tablet (25 mg total) by mouth 2 (two) times daily as needed for dizziness., Disp: 60 tablet, Rfl: 3   metFORMIN (GLUCOPHAGE-XR) 500 MG 24 hr tablet, Take 4 tablets (2,000 mg total) by mouth at bedtime., Disp: 360 tablet, Rfl: 3  pregabalin (LYRICA) 75 MG capsule, Take 1 capsule (75 mg total) by mouth 2 (two) times daily. (Patient taking differently: Take 150 mg by mouth at bedtime.), Disp: 60 capsule, Rfl: 3   Semaglutide, 2 MG/DOSE, (OZEMPIC, 2 MG/DOSE,) 8 MG/3ML SOPN, Inject 2 mg into the skin once a week., Disp: 3 mL, Rfl: 3   SEMGLEE, YFGN, 100 UNIT/ML Pen, Inject 30 Units into the skin every evening., Disp: , Rfl:    traMADol (ULTRAM) 50 MG tablet, Take 50 mg by mouth every 6 (six) hours as needed for severe pain (pain score 7-10)., Disp: , Rfl:    zolpidem (AMBIEN) 10 MG tablet, Take 10 mg by mouth at bedtime as needed for sleep., Disp: , Rfl:    Social History   Tobacco Use  Smoking Status Former   Current packs/day: 0.00   Types: Cigarettes   Quit date: 11/28/1962   Years since quitting: 60.9  Smokeless Tobacco Never    Allergies  Allergen Reactions   Nsaids Nausea And Vomiting    Acid reflux   Penicillins Other (See Comments)    Did it involve swelling of the face/tongue/throat, SOB, or low BP? Unk Did it involve sudden or severe rash/hives, skin peeling, or any reaction on the inside of your mouth or nose? Unk  Did you need to seek medical attention at a hospital or doctor's office? Unk When did it last happen? Childhood       Has tolerated cephalosporins on several occasions (2023-2024)   Objective:  There were no vitals filed for this visit. There is no height or weight on file to calculate BMI. Constitutional Well developed. Well nourished.  Vascular Foot warm and well perfused. Capillary refill normal to all digits.   Neurologic Normal speech. Oriented to person, place, and time. Epicritic sensation to light touch grossly present bilaterally.  Dermatologic Skin healing well without signs of infection on fifth digit . Skin edges well coapted without signs of infection. Right hallux with wound distally and mild erythema and edema surrounding. No probe to bone. No purulence noted.   Orthopedic: Tenderness to palpation noted about the surgical site.   Radiographs: No major interval changes in X-rays from OR.   Component 7 d ago  Specimen Description WOUND RIGHT TOE  Special Requests RT GREAT TOE HALLUX SWAB SAMPLE C  Gram Stain RARE WBC PRESENT, PREDOMINANTLY PMN RARE GRAM POSITIVE COCCI RARE GRAM NEGATIVE RODS  Culture MODERATE ENTEROBACTER CLOACAE MODERATE GROUP B STREP(S.AGALACTIAE)ISOLATED TESTING AGAINST S. AGALACTIAE NOT ROUTINELY PERFORMED DUE TO PREDICTABILITY OF AMP/PEN/VAN SUSCEPTIBILITY. NO ANAEROBES ISOLATED Performed at [facility], [address], [place], [place] [address]  Report  Status 10/07/2023 FINAL  Organism ID, Bacteria ENTEROBACTER CLOACAE   Component 7 d ago  Specimen Description BONE BIOPSY  Special Requests 5TH TOE  Gram Stain NO WBC SEEN NO ORGANISMS SEEN  Culture RARE GROUP B STREP(S.AGALACTIAE)ISOLATED TESTING AGAINST S. AGALACTIAE NOT ROUTINELY PERFORMED DUE TO PREDICTABILITY OF AMP/PEN/VAN SUSCEPTIBILITY. NO ANAEROBES ISOLATED Performed at [facility], [address] [place], [place] [address]  Report Status 10/08/2023 FINAL      Component 7 d ago  Specimen Description BIOPSY BONE RIGHT TOE  Special Requests RT GREAT TOE SAMPLE A  Gram Stain NO WBC SEEN NO ORGANISMS SEEN  Culture MODERATE ENTEROBACTER CLOACAE FEW GROUP B STREP(S.AGALACTIAE)ISOLATED TESTING AGAINST S. AGALACTIAE NOT ROUTINELY PERFORMED DUE TO PREDICTABILITY OF AMP/PEN/VAN SUSCEPTIBILITY. NO ANAEROBES ISOLATED Performed at [facility], [address], [place], [place]  [address]  Report Status 10/07/2023 FINAL  Resulting Agency [facility]           Assessment:   1. Diabetic ulcer of toe of right foot associated with diabetes mellitus due to underlying condition, with fat layer exposed (HCC)   2. Controlled type 2 diabetes with neuropathy [facility])    Plan:  Patient was evaluated and treated and all questions answered.  S/p foot surgery right -Progressing as expected post-operatively. No worsening infection noted  -WB Status: WBAT in surgical shoe -Sutures: intact fifth digit. Holding some tension on great toe. -Medications: Continue antibitoics per ID.  -Foot redressed. Will do daily dressing changes with betadine.   Return in 2 weesk for wound check   Return in about 2 weeks (around 10/23/2023) for wound check.

## 2023-10-12 ENCOUNTER — Inpatient Hospital Stay: Payer: Medicaid Other | Attending: Gynecologic Oncology

## 2023-10-12 ENCOUNTER — Encounter: Payer: Self-pay | Admitting: Hematology and Oncology

## 2023-10-12 ENCOUNTER — Inpatient Hospital Stay (HOSPITAL_BASED_OUTPATIENT_CLINIC_OR_DEPARTMENT_OTHER): Payer: Medicaid Other | Admitting: Hematology and Oncology

## 2023-10-12 VITALS — BP 129/67 | HR 87

## 2023-10-12 VITALS — BP 112/56 | HR 85 | Temp 97.9°F | Resp 18 | Ht 73.0 in | Wt 242.2 lb

## 2023-10-12 DIAGNOSIS — E538 Deficiency of other specified B group vitamins: Secondary | ICD-10-CM

## 2023-10-12 DIAGNOSIS — C541 Malignant neoplasm of endometrium: Secondary | ICD-10-CM

## 2023-10-12 MED ORDER — CYANOCOBALAMIN 1000 MCG/ML IJ SOLN
1000.0000 ug | Freq: Once | INTRAMUSCULAR | Status: AC
  Administered 2023-10-12: 1000 ug via INTRAMUSCULAR
  Filled 2023-10-12: qty 1

## 2023-10-12 NOTE — Patient Instructions (Signed)
 Vitamin B12 Injection What is this medication? Vitamin B12 (VAHY tuh min B12) prevents and treats low vitamin B12 levels in your body. It is used in people who do not get enough vitamin B12 from their diet or when their digestive tract does not absorb enough. Vitamin B12 plays an important role in maintaining the health of your nervous system and red blood cells. This medicine may be used for other purposes; ask your health care provider or pharmacist if you have questions. COMMON BRAND NAME(S): B-12 Compliance Kit, B-12 Injection Kit, Cyomin, Dodex, LA-12, Nutri-Twelve, Physicians EZ Use B-12, Primabalt, Vitamin Deficiency Injectable System - B12 What should I tell my care team before I take this medication? They need to know if you have any of these conditions: Kidney disease Leber's disease Megaloblastic anemia An unusual or allergic reaction to cyanocobalamin, cobalt, other medications, foods, dyes, or preservatives Pregnant or trying to get pregnant Breast-feeding How should I use this medication? This medication is injected into a muscle or deeply under the skin. It is usually given in a clinic or care team's office. However, your care team may teach you how to inject yourself. Follow all instructions. Talk to your care team about the use of this medication in children. Special care may be needed. Overdosage: If you think you have taken too much of this medicine contact a poison control center or emergency room at once. NOTE: This medicine is only for you. Do not share this medicine with others. What if I miss a dose? If you are given your dose at a clinic or care team's office, call to reschedule your appointment. If you give your own injections, and you miss a dose, take it as soon as you can. If it is almost time for your next dose, take only that dose. Do not take double or extra doses. What may interact with this medication? Alcohol Colchicine This list may not describe all possible  interactions. Give your health care provider a list of all the medicines, herbs, non-prescription drugs, or dietary supplements you use. Also tell them if you smoke, drink alcohol, or use illegal drugs. Some items may interact with your medicine. What should I watch for while using this medication? Visit your care team regularly. You may need blood work done while you are taking this medication. You may need to follow a special diet. Talk to your care team. Limit your alcohol intake and avoid smoking to get the best benefit. What side effects may I notice from receiving this medication? Side effects that you should report to your care team as soon as possible: Allergic reactions--skin rash, itching, hives, swelling of the face, lips, tongue, or throat Swelling of the ankles, hands, or feet Trouble breathing Side effects that usually do not require medical attention (report to your care team if they continue or are bothersome): Diarrhea This list may not describe all possible side effects. Call your doctor for medical advice about side effects. You may report side effects to FDA at 1-800-FDA-1088. Where should I keep my medication? Keep out of the reach of children. Store at room temperature between 15 and 30 degrees C (59 and 85 degrees F). Protect from light. Throw away any unused medication after the expiration date. NOTE: This sheet is a summary. It may not cover all possible information. If you have questions about this medicine, talk to your doctor, pharmacist, or health care provider.  2024 Elsevier/Gold Standard (2021-07-27 00:00:00)

## 2023-10-12 NOTE — Assessment & Plan Note (Signed)
She is not symptomatic Her next imaging study would be January of next year I will see her then with further follow-up, port flushes and B12 injection

## 2023-10-12 NOTE — Assessment & Plan Note (Signed)
She has severe vitamin B12 deficiency She will continue B12 injection today and again in January 2025

## 2023-10-12 NOTE — Progress Notes (Signed)
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 Her next imaging study would be January of next year I will see her then with further follow-up, port flushes and B12 injection  Vitamin B12 deficiency She has severe vitamin B12 deficiency She will continue B12 injection today and again in January 2025  Orders Placed This Encounter  Procedures   CT ABDOMEN PELVIS W CONTRAST    Standing Status:   Future    Standing Expiration Date:   10/11/2024    Order Specific Question:   If indicated for the ordered procedure, I authorize the administration of contrast media per Radiology protocol    Answer:   Yes    Order Specific Question:   Does the patient have a contrast media/X-ray dye allergy?    Answer:   No    Order Specific Question:   Preferred imaging location?    Answer:   [facility]    Order Specific Question:   If indicated for the ordered procedure, I authorize the administration of oral contrast media per Radiology protocol    Answer:   Yes   Vitamin B12    Standing Status:   Future    Standing Expiration Date:   10/11/2024    All questions were answered. The patient knows to call the clinic with any problems, questions or concerns. The total time spent in the appointment was 20 minutes encounter with patients including review of chart and various tests results, discussions about plan of care and coordination of care plan   Artis Delay, MD 10/12/2023 3:08 PM  INTERVAL HISTORY: Please see below for problem oriented charting. she returns for surveillance follow-up for history of uterine cancer She was recently hospitalized and had surgery for osteomyelitis She is still receiving IV antibiotics She denies abdominal pain or changes in her bowel habits  REVIEW OF SYSTEMS:   Constitutional: Denies fevers, chills or  abnormal weight loss Eyes: Denies blurriness of vision Ears, nose, mouth, throat, and face: Denies mucositis or sore throat Respiratory: Denies cough, dyspnea or wheezes Cardiovascular: Denies palpitation, chest discomfort or lower extremity swelling Gastrointestinal:  Denies nausea, heartburn or change in bowel habits Skin: Denies abnormal skin rashes Lymphatics: Denies new lymphadenopathy or easy bruising Neurological:Denies numbness, tingling or new weaknesses Behavioral/Psych: Mood is stable, no new changes  All other systems were reviewed with the patient and are negative.  I have reviewed the past medical history, past surgical history, social history and family history with the patient and they are unchanged from previous note.  ALLERGIES:  is allergic to nsaids and penicillins.  MEDICATIONS:  Current Outpatient Medications  Medication Sig Dispense Refill   Accu-Chek Softclix Lancets lancets CHECK BLOOD SUGAR 4 TIMES A DAY BEFORE MEALS AND BEDTIME 204 each 10   acetaminophen (TYLENOL) 650 MG CR tablet Take 1,300-1,950 mg by mouth every 8 (eight) hours as needed for pain.     atorvastatin (LIPITOR) 80 MG tablet Take 1 tablet (80 mg total) by mouth daily. 90 tablet 3   cyanocobalamin (VITAMIN B12) 1000 MCG/ML injection Inject 1 mL (1,000 mcg total) into the muscle every 30 (thirty) days. 1 mL 11   ertapenem (INVANZ) IVPB Inject 1 g into the vein daily for 24 days. Indication:  Osteomyelitis of R great toe First Dose: Yes Last Day of Therapy:  10/30/23 Labs - Once weekly:  CBC/D and BMP, Labs - Once weekly: ESR and CRP Method of administration: Mini-Bag Plus / Gravity Method of administration may be changed at the discretion of home infusion pharmacist based upon assessment of the patient and/or caregiver's ability to self-administer the medication ordered. 24 Units 0   glucose blood (ACCU-CHEK GUIDE) test strip Check blood sugar 4 times per day 125 each 12   insulin lispro  (HUMALOG) 100 UNIT/ML injection Inject 0.07 mLs (7 Units total) into the skin 3 (three) times daily with meals. 10 mL 3   Insulin Syringe-Needle U-100 31G X 15/64" 0.5 ML MISC 1 Syringe by Does not apply route 3 (three) times daily. 50 each 3   meclizine (ANTIVERT) 25 MG tablet Take 1 tablet (25 mg total) by mouth 2 (two) times daily as needed for dizziness. 60 tablet 3   metFORMIN (GLUCOPHAGE-XR) 500 MG 24 hr tablet Take 4 tablets (2,000 mg total) by mouth at bedtime. 360 tablet 3   pregabalin (LYRICA) 75 MG capsule Take 1 capsule (75 mg total) by mouth 2 (two) times daily. (Patient taking differently: Take 150 mg by mouth at bedtime.) 60 capsule 3   Semaglutide, 2 MG/DOSE, (OZEMPIC, 2 MG/DOSE,) 8 MG/3ML SOPN Inject 2 mg into the skin once a week. 3 mL 3   SEMGLEE, YFGN, 100 UNIT/ML Pen Inject 30 Units into the skin every evening.     traMADol (ULTRAM) 50 MG tablet Take 50 mg by mouth every 6 (six) hours as needed for severe pain (pain score 7-10).     zolpidem (AMBIEN) 10 MG tablet Take 10 mg by mouth at bedtime as needed for sleep.     No current facility-administered medications for this visit.    SUMMARY OF ONCOLOGIC HISTORY: Oncology History Overview Note  P53 wild type   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(HCC)  02/01/2022 Initial Diagnosis   She presented with postmenopausal bleeding   02/07/2022 Imaging   US pelvis Thickened heterogeneous endometrium with masslike region with internal vascularity. Findings may be associated with endometrial carcinoma or hyperplasia. Biopsy is recommended for further evaluation.   02/22/2022 Initial Biopsy   EMB: MMMT/carcinosarcoma (predominance of sarcomatous component)   03/05/2022 Imaging   Ct chest, abdomen and pelvis 1. Heterogeneous enlargement of the endometrial cavity is compatible with the reported history of uterine carcinosarcoma. No definite involvement of the posterior bladder wall or sigmoid colon/rectum. 2. 9 mm short axis right external  iliac node is upper normal for size. Attention on follow-up recommended. Otherwise no lymphadenopathy in the abdomen or pelvis. 3. 3.6 x 2.7 cm lesion in the lateral segment left liver has subtle peripheral nodular enhancement. This is almost certainly a benign cavernous hemangioma, but MRI abdomen with and without contrast recommended to confirm. 4. Aortic Atherosclerosis (ICD10-I70.0).   03/08/2022 Initial Diagnosis   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(HCC)   03/30/2022 Surgery   TRH/BSO, SLN biopsy on right, left pelvic LND, LOS, mini-lap  Findings: On EUA, 12cm minimally mobile uterus. On intra-abdominal exam, normal upper abdominal survey. Omentum adherent to the anterior abdominal wall along the prior midline incision. Normal omentum otherwise, normal small and large bowel. 12 cm uterus densely adherent to the anterior abdominal wall, obliterating some of the anterior anatomy including the anterior cul de sac. Normal appearing bilateral adnexa. Mapping successful to right obturator SLN, mildly enlarged. Dye seen within the parametrium on the left, no SLNs identified. No obvious adenopathy on the left. Decision made given length of surgery, comorbitidies to defer left para-aortic lymphadenectomy. Mini-lap required  for specimen delivery. Dome intact on cystoscopy and good efflux noted from bilateral ureteral orifices.   03/30/2022 Pathology Results   MMMT/carcinosarcoma, 6.3 cm MI 1cm or 3 (<50%) Cervical stroma, bilateral tubes/ovaries benign R SLN and L pelvic LNDs negative  ONCOLOGY TABLE:   UTERUS, CARCINOMA OR CARCINOSARCOMA: Resection   Procedure: Total hysterectomy and bilateral salpingo-oophorectomy  Histologic Type:  Malignant mixed Mullerian tumor (MMMT/ carcinosarcoma)  Histologic Grade: High-grade  Myometrial Invasion:       Depth of Myometrial Invasion (mm): 10 mm       Myometrial Thickness (mm): 30 mm       Percentage of Myometrial Invasion: 33%  Uterine Serosa Involvement: Not  identified  Cervical stromal Involvement: Not identified  Extent of involvement of other tissue/organs: Not identified  Peritoneal/Ascitic Fluid: Not applicable  Lymphovascular Invasion: Not identified  Regional Lymph Nodes:       Pelvic Lymph Nodes Examined:                                   1 Sentinel                                   6 Non-sentinel                                   7 Total       Pelvic Lymph Nodes with Metastasis: 0                           Macrometastasis: (>2.0 mm): 0                           Micrometastasis: (>0.2 mm and < 2.0 mm): 0                           Isolated Tumor Cells (<0.2 mm): 0                           Laterality of Lymph Node with Tumor: Not  applicable                           Extracapsular Extension: Not applicable       Para-aortic Lymph Nodes Examined:                                    0 Sentinel                                    0 non-sentinel                                    0 total  Distant Metastasis:       Distant Site(s) Involved: Not applicable  Pathologic Stage Classification (pTNM, AJCC 8th Edition): pT1a, pN0  Ancillary Studies: MMR / MSI testing will be ordered  Representative Tumor Block: B1  Comment(s): Pancytokeratin was performed on the lymph nodes and is  negative.    04/08/2022 Cancer Staging   Staging form: Corpus Uteri - Carcinoma and Carcinosarcoma, AJCC 8th Edition - Pathologic stage from 04/08/2022: FIGO Stage IA (pT1a, pN0, cM0) - Signed by Artis Delay, MD on 04/08/2022 Stage prefix: Initial diagnosis   04/21/2022 Procedure   Placement of single lumen port a cath via right internal jugular vein. The catheter tip lies at the cavo-atrial junction. A power injectable port a cath was placed and is ready for immediate use.       04/29/2022 - 08/18/2022 Chemotherapy   Patient is on Treatment Plan : UTERINE Carboplatin AUC 6 / Paclitaxel q21d     06/13/2022 - 07/11/2022 Radiation Therapy   06/13/2022 through  07/11/2022 Site Technique Total Dose (Gy) Dose per Fx (Gy) Completed Fx Beam Energies  Vagina: Pelvis HDR-brachy 30/30 6 5/5 Ir-192     09/14/2022 Imaging   Narrative & Impression   EXAM: CT ABDOMEN AND PELVIS WITH CONTRAST   TECHNIQUE: Multidetector CT imaging of the abdomen and pelvis was performed using the standard protocol following bolus administration of intravenous contrast.   RADIATION DOSE REDUCTION: This exam was performed according to the departmental dose-optimization program which includes automated exposure control, adjustment of the mA and/or kV according to patient size and/or use of iterative reconstruction technique.   CONTRAST:  OMNIPAQUE IOHEXOL 300 MG/ML  SOLN   COMPARISON:  March 04, 2022   FINDINGS: Lower chest: Basilar atelectasis. No effusion or consolidative changes.   Hepatobiliary: LEFT hepatic lobe lesion at 3.4 cm is unchanged since April of 2023 with peripheral and nodular enhancement and centripetal filling noted on previous imaging compatible with large hepatic hemangioma.   Small hypodensity in the posterior RIGHT hemiliver not definitely seen on previous imaging (image 19/2) 8 mm, perhaps present on the prior study but masked by artifact from the patient's arm.   Low-density focus in the posterior RIGHT hemiliver, hepatic subsegment VII (image 16/2) 11 mm, this is stable. There is background hepatic steatosis.   Subtle area of low attenuation in the RIGHT hemiliver (image 31/2) 9 mm. Portal vein is patent. No pericholecystic stranding or signs of biliary duct distension.   Pancreas: Normal, without mass, inflammation or ductal dilatation.   Spleen: Normal.   Adrenals/Urinary Tract:   Adrenal glands are unremarkable. Symmetric renal enhancement. No sign of hydronephrosis. No suspicious renal lesion or perinephric stranding.   Urinary bladder is grossly unremarkable.   Stomach/Bowel: No acute gastrointestinal findings. Appendix is  normal.   Vascular/Lymphatic:   Aortic atherosclerosis. No sign of aneurysm. Smooth contour of the IVC. There is no gastrohepatic or hepatoduodenal ligament lymphadenopathy. No retroperitoneal or mesenteric lymphadenopathy.   No pelvic sidewall lymphadenopathy.   Mild expansion of the RIGHT ovarian vein with central low attenuation that suggests thrombus within ligated ovarian vein. Delayed images without signs of propagation into the IVC.   Reproductive: Post hysterectomy. Small amount of fluid in the surgical bed (image 81/2) no peripheral enhancement. This area measuring 4.2 x 1.9 cm. Scattered areas of fluid density which are smaller, other areas less than a cm. Mild fascial thickening along the RIGHT pelvic sidewall. (Image 75/2) mild serosal thickening of the sigmoid colon is suggested. No peritoneal nodularity outside of the pelvis.   Other: As above   Musculoskeletal: No acute bone finding. No destructive bone process. Spinal degenerative changes.   IMPRESSION: 1. Post hysterectomy. Small amount of fluid in the surgical bed  no peripheral enhancement. This area measuring 4.2 x 1.9 cm. This and other findings in the pelvis such as serosal thickening along the sigmoid and fascial thickening along the RIGHT pelvis may reflect postoperative changes. Would suggest close attention on follow-up to exclude the possibility of peritoneal involvement. 2. Mild expansion of the RIGHT ovarian vein with central low attenuation suggests ovarian venous thrombus. 3. Hepatic lesions 2 of which were definitively present on prior imaging in hepatic subsegment VII and in the lateral segment of the LEFT hepatic lobe, lateral segment LEFT hepatic lobe lesion is compatible with benign hemangioma. There are 2 lesions which may be new and or mast on the prior study by artifact and are indeterminate. Suggest hepatic MRI for further assessment. At the time of hepatic MRI, MRV sequences could be obtained if warranted  to assess for RIGHT ovarian venous thrombosis. 4. Background hepatic steatosis. 5. Aortic atherosclerosis, mild.     12/14/2022 Imaging   CT scan  Again surgical changes from previous hysterectomy. The previous fluid in the dependent pelvis is improved with some residual stranding. Slight soft tissue thickening along the fascial planes of the pelvis but no discrete mass lesion or lymph node enlargement.   Stable in the right gonadal, ovarian vein. Small amount of thrombus is possible and stable.   Fatty liver infiltration with a segment 2 hemangioma. Faint low-attenuation small foci in segment 7 of the liver on the prior today 1 is stable in the other is not as well seen. Recommend continued surveillance   Aortic atherosclerosis      PHYSICAL EXAMINATION: ECOG PERFORMANCE STATUS: 0 - Asymptomatic  Vitals:   10/12/23 1228  BP: (!) 112/56  Pulse: 85  Resp: 18  Temp: 97.9 F (36.6 C)  SpO2: 100%   Filed Weights   10/12/23 1228  Weight: 242 lb 3.2 oz (109.9 kg)    GENERAL:alert, no distress and comfortable LABORATORY DATA:  I have reviewed the data as listed    Component Value Date/Time   NA 136 10/04/2023 0317   NA 139 09/22/2021 0954   K 4.3 10/04/2023 0317   CL 103 10/04/2023 0317   CO2 24 10/04/2023 0317   GLUCOSE 268 (H) 10/04/2023 0317   BUN 15 10/04/2023 0317   BUN 13 09/22/2021 0954   CREATININE 0.88 10/04/2023 0317   CREATININE 0.89 06/29/2023 1204   CREATININE 0.67 04/23/2015 1645   CALCIUM 8.8 (L) 10/04/2023 0317   PROT 6.7 09/30/2023 1542   PROT 7.3 09/22/2021 0954   ALBUMIN 3.2 (L) 09/30/2023 1542   ALBUMIN 4.4 09/22/2021 0954   AST 17 09/30/2023 1542   AST 16 06/29/2023 1204   ALT 14 09/30/2023 1542   ALT 14 06/29/2023 1204   ALKPHOS 78 09/30/2023 1542   BILITOT 0.3 09/30/2023 1542   BILITOT 0.5 06/29/2023 1204   GFRNONAA >60 10/04/2023 0317   GFRNONAA >60 06/29/2023 1204   GFRNONAA >89 01/06/2014 1643   GFRAA >60 02/25/2020 0334   GFRAA >89  01/06/2014 1643    No results found for: "SPEP", "UPEP"  Lab Results  Component Value Date   WBC 3.6 (L) 10/04/2023   NEUTROABS 2.9 09/30/2023   HGB 9.7 (L) 10/04/2023   HCT 30.4 (L) 10/04/2023   MCV 99.0 10/04/2023   PLT 192 10/04/2023      Chemistry      Component Value Date/Time   NA 136 10/04/2023 0317   NA 139 09/22/2021 0954   K 4.3 10/04/2023 0317   CL 103  10/04/2023 0317   CO2 24 10/04/2023 0317   BUN 15 10/04/2023 0317   BUN 13 09/22/2021 0954   CREATININE 0.88 10/04/2023 0317   CREATININE 0.89 06/29/2023 1204   CREATININE 0.67 04/23/2015 1645      Component Value Date/Time   CALCIUM 8.8 (L) 10/04/2023 0317   ALKPHOS 78 09/30/2023 1542   AST 17 09/30/2023 1542   AST 16 06/29/2023 1204   ALT 14 09/30/2023 1542   ALT 14 06/29/2023 1204   BILITOT 0.3 09/30/2023 1542   BILITOT 0.5 06/29/2023 1204       RADIOGRAPHIC STUDIES: I have personally reviewed the radiological images as listed and agreed with the findings in the report. DG Foot Complete Right  Result Date: 10/02/2023 CLINICAL DATA:  Postop, osteomyelitis. EXAM: RIGHT FOOT COMPLETE - 3+ VIEW COMPARISON:  Preoperative imaging. FINDINGS: Fragmentation of the distal phalanx of the first which is increased from preoperative exam and is presumably postsurgical. Expected postsurgical change in the overlying soft tissues. Chronic deformity of the fifth toe proximal interphalangeal joint. IMPRESSION: Fragmentation of the first toe distal phalanx is presumably postsurgical. Expected postsurgical change in the overlying soft tissues. Electronically Signed   By: Narda Rutherford M.D.   On: 10/02/2023 20:18   MR FOOT RIGHT W WO CONTRAST  Result Date: 09/30/2023 CLINICAL DATA:  Right great toe wound EXAM: MRI OF THE RIGHT FOREFOOT WITHOUT AND WITH CONTRAST TECHNIQUE: Multiplanar, multisequence MR imaging of the right forefoot was performed before and after the administration of intravenous contrast. CONTRAST:  10mL  GADAVIST GADOBUTROL 1 MMOL/ML IV SOLN COMPARISON:  X-ray 09/30/2023, MRI 04/15/2021 FINDINGS: Bones/Joint/Cartilage Intense bone marrow edema and enhancement throughout the great toe distal phalanx with confluent low T1 bone marrow signal compatible with acute osteomyelitis. Faint marrow edema within the distal aspect of the great toe proximal phalanx with preservation of the fatty T1 marrow signal. Small effusion of the great toe interphalangeal joint. There also abnormal bone marrow signal changes including confluent low T1 signal within the proximal and distal phalanx of the fifth toe. No additional sites of bone marrow edema or marrow replacement. No fracture or dislocation. Ligaments Intact Lisfranc ligament.  Intact collateral ligaments. Muscles and Tendons Denervation changes of the foot musculature.  No tenosynovitis. Soft tissues Small wound or ulceration along the lateral aspect of the fifth toe near the interphalangeal joint. Soft tissue swelling of the great toe. Diffuse soft tissue swelling and edema throughout the dorsum of the foot. No organized or drainable fluid collections. IMPRESSION: 1. Acute osteomyelitis of the right great toe distal phalanx. 2. Faint marrow edema within the distal aspect of the great toe proximal phalanx is favored to represent reactive osteitis. 3. Small wound or ulceration along the lateral aspect of the fifth toe near the interphalangeal joint. Acute osteomyelitis of the proximal and distal phalanx of the fifth toe. 4. Diffuse soft tissue swelling and edema throughout the dorsum of the foot. No organized or drainable fluid collections. Electronically Signed   By: Duanne Guess D.O.   On: 09/30/2023 20:30   DG Foot 2 Views Right  Result Date: 09/30/2023 CLINICAL DATA:  Great toe wound EXAM: RIGHT FOOT - 2 VIEW COMPARISON:  None Available. FINDINGS: No fracture or dislocation. Soft tissue swelling about the mid and forefoot. Well corticated plantar calcaneal spur.  There is some progressive sclerosis of the distal tuft of the distal phalanx of the great toe. Question subtle distal tuft erosive change. IMPRESSION: Soft tissue swelling again seen with some  sclerosis now seen of the distal aspect of the distal phalanx of the great toe with subtle erosive changes. Please correlate for an area of osteomyelitis. Confirmatory workup with MRI or bone scan could be considered as clinically appropriate. Electronically Signed   By: Karen Kays M.D.   On: 09/30/2023 13:17

## 2023-10-23 ENCOUNTER — Encounter: Payer: Self-pay | Admitting: Podiatry

## 2023-10-23 ENCOUNTER — Ambulatory Visit: Payer: Medicaid Other | Admitting: Podiatry

## 2023-10-23 DIAGNOSIS — E1140 Type 2 diabetes mellitus with diabetic neuropathy, unspecified: Secondary | ICD-10-CM

## 2023-10-23 DIAGNOSIS — E08621 Diabetes mellitus due to underlying condition with foot ulcer: Secondary | ICD-10-CM

## 2023-10-23 DIAGNOSIS — L97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 exposed: Secondary | ICD-10-CM

## 2023-10-23 NOTE — Progress Notes (Signed)
Subjective:  [name], female    DOB: 02/02/1961,  MRN: [id number]  No chief complaint on file.   DOS: 10/02/23 Procedure: Right great and right fifth toe irrigation and debridement with bone biopsy. With Dr. Annamary Rummage   62 y.o. female returns for POV#2. Relates doing well. Has been receiving antibiotics through her port. Getting ertapenem for 4 weeks than will be on PO barctrim cipro and cefadroxil  Review of Systems: Negative except as noted in the HPI. Denies N/V/F/Ch.  Past Medical History:  Diagnosis Date   Anemia    Asthma 11/01/2010   only when respiratory infections   BPPV (benign paroxysmal positional vertigo) 03/07/2013   Broken teeth 11/02/2016   Callus of toe 07/07/2022   Colon cancer screening 05/05/2020   Diabetes mellitus    since 1983   Dizziness 09/22/2021   Elevated TSH 05/13/2013   Endometrial cancer (HCC)    Gangrene of toe of right foot (HCC) 04/14/2021   GERD (gastroesophageal reflux disease)    Hand pain, right 03/14/2019   History of radiation therapy    Vagina- 06/13/22-07/11/22-Dr. 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   Qualifier: Diagnosis of  By: Daphine Deutscher FNP, Nykedtra     Osteomyelitis [facility])    Pneumonia    as a child   Wrist pain 04/11/2013    Current Outpatient Medications:    Accu-Chek Softclix Lancets lancets, CHECK BLOOD SUGAR 4 TIMES A DAY BEFORE MEALS AND BEDTIME, Disp: 204 each, Rfl: 10   acetaminophen (TYLENOL) 650 MG CR tablet, Take 1,300-1,950 mg by mouth every 8 (eight) hours as needed for pain., Disp: , Rfl:    atorvastatin (LIPITOR) 80 MG tablet, Take 1 tablet (80 mg total) by mouth daily., Disp: 90 tablet, Rfl: 3   cyanocobalamin (VITAMIN B12) 1000 MCG/ML injection, Inject 1 mL (1,000 mcg total) into the muscle every 30 (thirty) days., Disp: 1 mL, Rfl: 11   ertapenem (INVANZ) IVPB, Inject 1 g  into the vein daily for 24 days. Indication:  Osteomyelitis of R great toe First Dose: Yes Last Day of Therapy:  10/30/23 Labs - Once weekly:  CBC/D and BMP, Labs - Once weekly: ESR and CRP Method of administration: Mini-Bag Plus / Gravity Method of administration may be changed at the discretion of home infusion pharmacist based upon assessment of the patient and/or caregiver's ability to self-administer the medication ordered., Disp: 24 Units, Rfl: 0   glucose blood (ACCU-CHEK GUIDE) test strip, Check blood sugar 4 times per day, Disp: 125 each, Rfl: 12   insulin lispro (HUMALOG) 100 UNIT/ML injection, Inject 0.07 mLs (7 Units total) into the skin 3 (three) times daily with meals., Disp: 10 mL, Rfl: 3   Insulin Syringe-Needle U-100 31G X 15/64" 0.5 ML MISC, 1 Syringe by Does not apply route 3 (three) times daily., Disp: 50 each, Rfl: 3   meclizine (ANTIVERT) 25 MG tablet, Take 1 tablet (25 mg total) by mouth 2 (two) times daily as needed for dizziness., Disp: 60 tablet, Rfl: 3   metFORMIN (GLUCOPHAGE-XR) 500 MG 24 hr tablet, Take 4 tablets (2,000 mg total) by mouth at bedtime., Disp: 360 tablet, Rfl: 3   pregabalin (LYRICA) 75 MG capsule, Take 1 capsule (75 mg total) by mouth 2 (two) times daily. (Patient taking differently: Take 150 mg by mouth at bedtime.), Disp: 60 capsule, Rfl: 3  Semaglutide, 2 MG/DOSE, (OZEMPIC, 2 MG/DOSE,) 8 MG/3ML SOPN, Inject 2 mg into the skin once a week., Disp: 3 mL, Rfl: 3   SEMGLEE, YFGN, 100 UNIT/ML Pen, Inject 30 Units into the skin every evening., Disp: , Rfl:    traMADol (ULTRAM) 50 MG tablet, Take 50 mg by mouth every 6 (six) hours as needed for severe pain (pain score 7-10)., Disp: , Rfl:    zolpidem (AMBIEN) 10 MG tablet, Take 10 mg by mouth at bedtime as needed for sleep., Disp: , Rfl:   Social History   Tobacco Use  Smoking Status Former   Current packs/day: 0.00   Types: Cigarettes   Quit date: 11/28/1962   Years since quitting: 60.9  Smokeless Tobacco  Never    Allergies  Allergen Reactions   Nsaids Nausea And Vomiting    Acid reflux   Penicillins Other (See Comments)    Did it involve swelling of the face/tongue/throat, SOB, or low BP? Unk Did it involve sudden or severe rash/hives, skin peeling, or any reaction on the inside of your mouth or nose? Unk  Did you need to seek medical attention at a hospital or doctor's office? Unk When did it last happen? Childhood       Has tolerated cephalosporins on several occasions (2023-2024)   Objective:  There were no vitals filed for this visit. There is no height or weight on file to calculate BMI. Constitutional Well developed. Well nourished.  Vascular Foot warm and well perfused. Capillary refill normal to all digits.   Neurologic Normal speech. Oriented to person, place, and time. Epicritic sensation to light touch grossly present bilaterally.  Dermatologic Skin healing well without signs of infection on fifth digit . Skin edges well coapted without signs of infection. Right hallux with wound distally and mild erythema and edema surrounding. No probe to bone. No purulence noted.   Orthopedic: Tenderness to palpation noted about the surgical site.   Radiographs: No major interval changes in X-rays from OR.   Component 7 d ago  Specimen Description WOUND RIGHT TOE  Special Requests RT GREAT TOE HALLUX SWAB SAMPLE C  Gram Stain RARE WBC PRESENT, PREDOMINANTLY PMN RARE GRAM POSITIVE COCCI RARE GRAM NEGATIVE RODS  Culture MODERATE ENTEROBACTER CLOACAE MODERATE GROUP B STREP(S.AGALACTIAE)ISOLATED TESTING AGAINST S. AGALACTIAE NOT ROUTINELY PERFORMED DUE TO PREDICTABILITY OF AMP/PEN/VAN SUSCEPTIBILITY. NO ANAEROBES ISOLATED Performed at [facility], [address] [place], [place] [address]  Report Status 10/07/2023 FINAL  Organism ID, Bacteria ENTEROBACTER CLOACAE   Component 7 d ago  Specimen Description BONE BIOPSY  Special Requests 5TH TOE  Gram Stain NO WBC  SEEN NO ORGANISMS SEEN  Culture RARE GROUP B STREP(S.AGALACTIAE)ISOLATED TESTING AGAINST S. AGALACTIAE NOT ROUTINELY PERFORMED DUE TO PREDICTABILITY OF AMP/PEN/VAN SUSCEPTIBILITY. NO ANAEROBES ISOLATED Performed at [facility], [address], [place], [place] [address]  Report Status 10/08/2023 FINAL      Component 7 d ago  Specimen Description BIOPSY BONE RIGHT TOE  Special Requests RT GREAT TOE SAMPLE A  Gram Stain NO WBC SEEN NO ORGANISMS SEEN  Culture MODERATE ENTEROBACTER CLOACAE FEW GROUP B STREP(S.AGALACTIAE)ISOLATED TESTING AGAINST S. AGALACTIAE NOT ROUTINELY PERFORMED DUE TO PREDICTABILITY OF AMP/PEN/VAN SUSCEPTIBILITY. NO ANAEROBES ISOLATED Performed at [facility], [address] [place], [place] [address]  Report Status 10/07/2023 FINAL  Resulting Agency [facility]           Assessment:   No diagnosis found.  Plan:  Patient was evaluated  and treated and all questions answered.  S/p foot surgery right -Progressing as expected post-operatively. No worsening infection noted  -WB Status: WBAT in surgical shoe -Sutures: intact fifth digit. Holding some tension on great toe. -Medications: Continue antibitoics per ID.  -Foot redressed. Will do daily dressing changes with betadine.   Return in 2 weesk for wound check   No follow-ups on file.

## 2023-10-30 ENCOUNTER — Encounter: Payer: Self-pay | Admitting: Infectious Diseases

## 2023-10-30 ENCOUNTER — Ambulatory Visit (INDEPENDENT_AMBULATORY_CARE_PROVIDER_SITE_OTHER): Payer: Medicaid Other | Admitting: Infectious Diseases

## 2023-10-30 ENCOUNTER — Other Ambulatory Visit: Payer: Self-pay

## 2023-10-30 ENCOUNTER — Telehealth: Payer: Self-pay

## 2023-10-30 VITALS — BP 102/68 | HR 84 | Temp 98.0°F | Ht 73.0 in | Wt 243.0 lb

## 2023-10-30 DIAGNOSIS — M869 Osteomyelitis, unspecified: Secondary | ICD-10-CM | POA: Diagnosis present

## 2023-10-30 DIAGNOSIS — Z95828 Presence of other vascular implants and grafts: Secondary | ICD-10-CM | POA: Diagnosis not present

## 2023-10-30 DIAGNOSIS — Z79899 Other long term (current) drug therapy: Secondary | ICD-10-CM | POA: Insufficient documentation

## 2023-10-30 MED ORDER — CEFADROXIL 500 MG PO CAPS: 1000.0000 mg | ORAL_CAPSULE | Freq: Two times a day (BID) | ORAL | 0 refills | Status: DC

## 2023-10-30 MED ORDER — CIPROFLOXACIN HCL 750 MG PO TABS: 750.0000 mg | ORAL_TABLET | Freq: Two times a day (BID) | ORAL | 0 refills | Status: DC

## 2023-10-30 NOTE — Telephone Encounter (Signed)
Per Dr. Elinor Parkinson patient end date for IV abx is 12/2. Sent a message to Jeri Modena RN at [place] about orders.

## 2023-10-30 NOTE — Progress Notes (Addendum)
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   Obesity, Class I, BMI 30-34.9 03/08/2022   Visit for routine gyn exam 11/17/2021   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   Thoracic outlet syndrome 07/12/2020   Shoulder pain 05/29/2020   Neck pain 05/06/2020   Encounter for screening mammogram for malignant neoplasm of breast 05/05/2020   Low back pain 01/20/2020   Lumbar radiculopathy 10/28/2019   Strain of lumbar paraspinal muscle, initial encounter 09/10/2019   Long term current use of opiate analgesic 01/31/2017   Morton neuroma 11/02/2016   Diabetic ulcer of toe of left foot associated with diabetes mellitus due to underlying condition (HCC) 11/02/2016   Hyperlipidemia associated with type 2 diabetes mellitus (HCC) 04/22/2016   GERD (gastroesophageal reflux disease) 03/03/2014   Health care maintenance 01/06/2014   Asthma 11/01/2010   Hypertension 11/28/2008   Controlled type 2 diabetes with neuropathy (HCC) 11/28/1988    Patient's Medications  New Prescriptions   No medications on file  Previous Medications   ACCU-CHEK SOFTCLIX LANCETS LANCETS    CHECK BLOOD SUGAR 4 TIMES A DAY BEFORE MEALS AND BEDTIME   ACETAMINOPHEN (TYLENOL) 650 MG CR TABLET    Take 1,300-1,950 mg by mouth every 8 (eight) hours as needed for pain.   ATORVASTATIN (LIPITOR) 80 MG TABLET    Take 1 tablet (80 mg total) by  mouth daily.   CYANOCOBALAMIN (VITAMIN B12) 1000 MCG/ML INJECTION    Inject 1 mL (1,000 mcg total) into the muscle every 30 (thirty) days.   ERTAPENEM (INVANZ) IVPB    Inject 1 g into the vein daily for 24 days. Indication:  Osteomyelitis of R great toe First Dose: Yes Last Day of Therapy:  10/30/23 Labs - Once weekly:  CBC/D and BMP, Labs - Once weekly: ESR and CRP Method of administration: Mini-Bag Plus / Gravity Method of administration may be changed at the discretion of home infusion pharmacist based upon assessment of the patient and/or caregiver's ability to self-administer the medication ordered.   GLUCOSE BLOOD (ACCU-CHEK GUIDE) TEST STRIP    Check blood sugar 4 times per day   INSULIN LISPRO (HUMALOG) 100 UNIT/ML INJECTION    Inject 0.07 mLs (7 Units total) into the skin 3 (three) times daily with meals.   INSULIN SYRINGE-NEEDLE U-100 31G X 15/64" 0.5 ML MISC    1 Syringe by Does not apply route 3 (three) times daily.   MECLIZINE (ANTIVERT) 25 MG TABLET    Take 1 tablet (25 mg total) by mouth 2 (two) times daily as needed for dizziness.   METFORMIN (GLUCOPHAGE-XR) 500 MG 24 HR TABLET    Take 4 tablets (2,000 mg total) by mouth at bedtime.   PREGABALIN (LYRICA) 75 MG CAPSULE    Take 1 capsule (75 mg total) by mouth 2 (two) times daily.   SEMAGLUTIDE, 2 MG/DOSE, (OZEMPIC, 2 MG/DOSE,) 8 MG/3ML SOPN    Inject 2  mg into the skin once a week.   SEMGLEE, YFGN, 100 UNIT/ML PEN    Inject 30 Units into the skin every evening.   TRAMADOL (ULTRAM) 50 MG TABLET    Take 50 mg by mouth every 6 (six) hours as needed for severe pain (pain score 7-10).   ZOLPIDEM (AMBIEN) 10 MG TABLET    Take 10 mg by mouth at bedtime as needed for sleep.  Modified Medications   No medications on file  Discontinued Medications   No medications on file    Subjective: 62 year old female with PMH of DM2 with neuropathy, HTN, prior left first and second toe amputation, endometrial cancer who is here for HFU 11/2-11/8  for rt foot diabetic foot infection/osteomyelitis.   12/2 Getting IV abtx through her port without any concerns related to port or abtx. She has soft stool but no diarrhea, nausea, vomiting. Denies any fevers, chills. Denies any drainage or pain or swelling in the left great toe or 5th toe. Seen by Podiatry Dr Ralene Cork who said that wound looks ' fabulous and we have turned the corner". Last seen on 11/25 and wound healing with no signs of infection. She was also seen by Oncology Dr Bertis Ruddy carcinosarcoma of endometrium on 11/14 and plan for next imaging in January.   Review of Systems: all systems reviewed including MSK and negative  Past Medical History:  Diagnosis Date   Anemia    Asthma 11/01/2010   only when respiratory infections   BPPV (benign paroxysmal positional vertigo) 03/07/2013   Broken teeth 11/02/2016   Callus of toe 07/07/2022   Colon cancer screening 05/05/2020   Diabetes mellitus    since 1983   Dizziness 09/22/2021   Elevated TSH 05/13/2013   Endometrial cancer (HCC)    Gangrene of toe of right foot (HCC) 04/14/2021   GERD (gastroesophageal reflux disease)    Hand pain, right 03/14/2019   History of radiation therapy    Vagina- 06/13/22-07/11/22-Dr. 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   Qualifier: Diagnosis of  By: Daphine Deutscher FNP, Nykedtra     Osteomyelitis [facility])    Pneumonia    as a child   Wrist pain 04/11/2013   Past Surgical History:  Procedure Laterality Date   ABDOMINAL HYSTERECTOMY     ACHILLES TENDON LENGTHENING Left 08/29/2014   Procedure: Left Achilles Lengthening;  Surgeon: Nadara Mustard, MD;  Location: [facility] OR;  Service: Orthopedics;  Laterality: Left;   AMPUTATION Left 02/19/2013   Procedure: Amputation of Left Great Toe;  Surgeon: Kathryne Hitch, MD;  Location: [facility] OR;  Service: Orthopedics;  Laterality: Left;   AMPUTATION Left  08/29/2014   Procedure: Left Foot 2nd and 1st Toe Amputation;  Surgeon: Nadara Mustard, MD;  Location: [facility] OR;  Service: Orthopedics;  Laterality: Left;   CARDIAC CATHETERIZATION     15 years ago   CESAREAN SECTION     x 4   CYSTOSCOPY  03/30/2022   Procedure: CYSTOSCOPY;  Surgeon: Carver Fila, MD;  Location: [facility] ORS;  Service: Gynecology;;   IR IMAGING GUIDED PORT INSERTION  04/19/2022   IRRIGATION AND DEBRIDEMENT FOOT Right 10/02/2023   Procedure: IRRIGATION AND DEBRIDEMENT FOOT, bone biopsy RIGHT GREAT TOE AND RIGHT 5TH TOE;  Surgeon: Pilar Plate, DPM;  Location: [facility] OR;  Service: Orthopedics/Podiatry;  Laterality: Right;   ROBOTIC ASSISTED TOTAL  HYSTERECTOMY WITH BILATERAL SALPINGO OOPHERECTOMY Bilateral 03/30/2022   Procedure: XI ROBOTIC ASSISTED TOTAL HYSTERECTOMY WITH BILATERAL SALPINGO-OOPHORECTOMY, MINI LAPAROTOMY;  Surgeon: Carver Fila, MD;  Location: [facility] ORS;  Service: Gynecology;  Laterality: Bilateral;   SENTINEL NODE BIOPSY N/A 03/30/2022   Procedure: SENTINEL NODE BIOPSY;  Surgeon: Carver Fila, MD;  Location: [facility] ORS;  Service: Gynecology;  Laterality: N/A;   TUBAL LIGATION     at one of c-sections    Social History   Tobacco Use   Smoking status: Former    Current packs/day: 0.00    Types: Cigarettes    Quit date: 11/28/1962    Years since quitting: 60.9   Smokeless tobacco: Never  Vaping Use   Vaping status: Never Used  Substance Use Topics   Alcohol use: No    Alcohol/week: 0.0 standard drinks of alcohol   Drug use: No    Family History  Problem Relation Age of Onset   Hypertension Mother    Dementia Mother    Heart attack Father    Heart disease Father    Diabetes Father    Hypertension Father    Diabetes Daughter    Colon cancer Neg Hx    Breast cancer Neg Hx    Ovarian cancer Neg Hx    Endometrial cancer Neg Hx    Pancreatic cancer Neg Hx    Prostate cancer Neg Hx     Allergies  Allergen Reactions   Nsaids Nausea  And Vomiting    Acid reflux   Penicillins Other (See Comments)    Did it involve swelling of the face/tongue/throat, SOB, or low BP? Unk Did it involve sudden or severe rash/hives, skin peeling, or any reaction on the inside of your mouth or nose? Unk  Did you need to seek medical attention at a hospital or doctor's office? Unk When did it last happen? Childhood       Has tolerated cephalosporins on several occasions (2023-2024)    Health Maintenance  Topic Date Due   Colonoscopy  Never done   Diabetic kidney evaluation - Urine ACR  10/09/2018   FOOT EXAM  04/14/2022   LIPID PANEL  09/22/2022   DTaP/Tdap/Td (2 - Td or Tdap) 03/01/2023   INFLUENZA VACCINE  06/29/2023   COVID-19 Vaccine (3 - 2023-24 season) 07/30/2023   MAMMOGRAM  12/17/2023   OPHTHALMOLOGY EXAM  12/29/2023   HEMOGLOBIN A1C  01/01/2024   Diabetic kidney evaluation - eGFR measurement  10/03/2024   Hepatitis C Screening  Completed   HIV Screening  Completed   Zoster Vaccines- Shingrix  Completed   HPV VACCINES  Aged Out    Objective:  Vitals:   10/30/23 1410  BP: 102/68  Pulse: 84  Temp: 98 F (36.7 C)  TempSrc: Temporal  SpO2: 97%  Weight: 243 lb (110.2 kg)  Height: 6\' 1"  (1.854 m)   Body mass index is 32.06 kg/m.  Physical Exam Constitutional:      Appearance: Normal appearance.  HENT:     Head: Normocephalic and atraumatic.      Mouth: Mucous membranes are moist.  Eyes:    Conjunctiva/sclera: Conjunctivae normal.     Pupils: Pupils are equal, round, and bilaterally symmetrical  Cardiovascular:     Rate and Rhythm: Normal rate and regular rhythm.     Heart sounds:   Pulmonary:     Effort: Pulmonary effort is normal.     Breath sounds:   Abdominal:     General: Non distended  Palpations: soft.   Musculoskeletal:        General: Normal range of motion.   Rt foot Great toe wound almost healed with no signs of infection Rt 5th toe wound has healed   Skin:    General: Skin is  warm and dry.     Comments:  Neurological:     General: grossly non focal     Mental Status: awake, alert and oriented to person, place, and time.   Psychiatric:        Mood and Affect: Mood normal.   Lab Results Lab Results  Component Value Date   WBC 3.6 (L) 10/04/2023   HGB 9.7 (L) 10/04/2023   HCT 30.4 (L) 10/04/2023   MCV 99.0 10/04/2023   PLT 192 10/04/2023    Lab Results  Component Value Date   CREATININE 0.88 10/04/2023   BUN 15 10/04/2023   NA 136 10/04/2023   K 4.3 10/04/2023   CL 103 10/04/2023   CO2 24 10/04/2023    Lab Results  Component Value Date   ALT 14 09/30/2023   AST 17 09/30/2023   ALKPHOS 78 09/30/2023   BILITOT 0.3 09/30/2023    Lab Results  Component Value Date   CHOL 210 (H) 09/22/2021   HDL 59 09/22/2021   LDLCALC 125 (H) 09/22/2021   TRIG 148 09/22/2021   CHOLHDL 3.6 09/22/2021   No results found for: "LABRPR", "RPRTITER" No results found for: "HIV1RNAQUANT", "HIV1RNAVL", "CD4TABS"  Assessment/Plan # DFU/acute osteomyelitis of rt great toe distal phalanx # DFU/acute osteomyelitis of proximal and distal phalanx of 5th toe  -11/4 S/p I&D, distal phalanx hallux bone biopsy as well as fifth toe middle phalanx bone biopsy.  IV antibiotics were held preoperative to allow for improved intraoperative cultures. 11/4 Or cx enterobacter cloacae, Group B strep - 5/22 ABI -no evidence of significant arterial disease in the lower extremities  Plan - Stop ertapenem after today's dose. Start ciprofloxacin 750mg  po bid and cefadroxil qg po bid starting tomorrow for 2 weeks. Qtc 423 - Fu in 2 weeks  - Fu with Podiatry as instructed   # Medication management  11/27 hb 10.5, absolute esoinophils 2.3, ESR 3, CRP2, cr 0.81  # Port  - no concerns   # DM2 w neuropathy  - A1c 6.2 - Discussed BG control, fu with PCP  # 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- Follow Dr Bertis Ruddy, next imaging in [place]  I have personally spent 30 minutes involved in  face-to-face and non-face-to-face activities for this patient on the day of the visit. Professional time spent includes the following activities: Preparing to see the patient (review of tests), Obtaining and/or reviewing separately obtained history (admission/discharge record), Performing a medically appropriate examination and/or evaluation , Ordering medications/tests/procedures, referring and communicating with other health care professionals, Documenting clinical information in the EMR, Independently interpreting results (not separately reported), Communicating results to the patient/family/caregiver, Counseling and educating the patient/family/caregiver and Care coordination (not separately reported).   Of note, portions of this note may have been created with voice recognition software. While this note has been edited for accuracy, occasional wrong-word or 'sound-a-like' substitutions may have occurred due to the inherent limitations of voice recognition software.   Victoriano Lain, MD [facility]ious [facility] 10/30/2023, 2:28 PM

## 2023-10-31 DIAGNOSIS — Z95828 Presence of other vascular implants and grafts: Secondary | ICD-10-CM | POA: Insufficient documentation

## 2023-11-06 ENCOUNTER — Ambulatory Visit: Payer: Medicaid Other | Admitting: Podiatry

## 2023-11-09 ENCOUNTER — Telehealth: Payer: Self-pay

## 2023-11-09 NOTE — Telephone Encounter (Signed)
Called and given radiology scheduling # to call to schedule CT on 1/7. She will call to schedule.

## 2023-11-13 ENCOUNTER — Ambulatory Visit (INDEPENDENT_AMBULATORY_CARE_PROVIDER_SITE_OTHER): Payer: Medicaid Other | Admitting: Podiatry

## 2023-11-13 ENCOUNTER — Encounter: Payer: Self-pay | Admitting: Podiatry

## 2023-11-13 DIAGNOSIS — E1140 Type 2 diabetes mellitus with diabetic neuropathy, unspecified: Secondary | ICD-10-CM

## 2023-11-13 DIAGNOSIS — L84 Corns and callosities: Secondary | ICD-10-CM

## 2023-11-13 DIAGNOSIS — Z87898 Personal history of other specified conditions: Secondary | ICD-10-CM

## 2023-11-13 NOTE — Progress Notes (Signed)
Subjective:  [name], female    DOB: 11-30-1960,  MRN: [id number]  Chief Complaint  Patient presents with   Diabetic Ulcer    Pt presents for a follow up of Right great and right fifth toe     DOS: 10/02/23 Procedure: Right great and right fifth toe irrigation and debridement with bone biopsy. With Dr. Annamary Rummage   62 y.o. female returns for POV#3. Relates doing well. Has finished antibiotics in her port.  Finishing up with oral antibiotics.   Review of Systems: Negative except as noted in the HPI. Denies N/V/F/Ch.  Past Medical History:  Diagnosis Date   Anemia    Asthma 11/01/2010   only when respiratory infections   BPPV (benign paroxysmal positional vertigo) 03/07/2013   Broken teeth 11/02/2016   Callus of toe 07/07/2022   Colon cancer screening 05/05/2020   Diabetes mellitus    since 1983   Dizziness 09/22/2021   Elevated TSH 05/13/2013   Endometrial cancer (HCC)    Gangrene of toe of right foot (HCC) 04/14/2021   GERD (gastroesophageal reflux disease)    Hand pain, right 03/14/2019   History of radiation therapy    Vagina- 06/13/22-07/11/22-Dr. 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   Qualifier: Diagnosis of  By: Daphine Deutscher FNP, Nykedtra     Osteomyelitis [facility])    Pneumonia    as a child   Wrist pain 04/11/2013    Current Outpatient Medications:    Accu-Chek Softclix Lancets lancets, CHECK BLOOD SUGAR 4 TIMES A DAY BEFORE MEALS AND BEDTIME, Disp: 204 each, Rfl: 10   acetaminophen (TYLENOL) 650 MG CR tablet, Take 1,300-1,950 mg by mouth every 8 (eight) hours as needed for pain., Disp: , Rfl:    atorvastatin (LIPITOR) 80 MG tablet, Take 1 tablet (80 mg total) by mouth daily., Disp: 90 tablet, Rfl: 3   cefadroxil (DURICEF) 500 MG capsule, Take 2 capsules (1,000 mg total) by mouth 2 (two) times daily., Disp: 56 capsule, Rfl: 0    ciprofloxacin (CIPRO) 750 MG tablet, Take 1 tablet (750 mg total) by mouth 2 (two) times daily., Disp: 28 tablet, Rfl: 0   cyanocobalamin (VITAMIN B12) 1000 MCG/ML injection, Inject 1 mL (1,000 mcg total) into the muscle every 30 (thirty) days., Disp: 1 mL, Rfl: 11   glucose blood (ACCU-CHEK GUIDE) test strip, Check blood sugar 4 times per day, Disp: 125 each, Rfl: 12   insulin lispro (HUMALOG) 100 UNIT/ML injection, Inject 0.07 mLs (7 Units total) into the skin 3 (three) times daily with meals., Disp: 10 mL, Rfl: 3   Insulin Syringe-Needle U-100 31G X 15/64" 0.5 ML MISC, 1 Syringe by Does not apply route 3 (three) times daily., Disp: 50 each, Rfl: 3   meclizine (ANTIVERT) 25 MG tablet, Take 1 tablet (25 mg total) by mouth 2 (two) times daily as needed for dizziness., Disp: 60 tablet, Rfl: 3   metFORMIN (GLUCOPHAGE-XR) 500 MG 24 hr tablet, Take 4 tablets (2,000 mg total) by mouth at bedtime., Disp: 360 tablet, Rfl: 3   pregabalin (LYRICA) 75 MG capsule, Take 1 capsule (75 mg total) by mouth 2 (two) times daily. (Patient taking differently: Take 150 mg by mouth at bedtime.), Disp: 60 capsule, Rfl: 3   Semaglutide, 2 MG/DOSE, (OZEMPIC, 2 MG/DOSE,) 8 MG/3ML SOPN, Inject 2 mg into the skin once a week., Disp: 3  mL, Rfl: 3   SEMGLEE, YFGN, 100 UNIT/ML Pen, Inject 30 Units into the skin every evening., Disp: , Rfl:    traMADol (ULTRAM) 50 MG tablet, Take 50 mg by mouth every 6 (six) hours as needed for severe pain (pain score 7-10)., Disp: , Rfl:    zolpidem (AMBIEN) 10 MG tablet, Take 10 mg by mouth at bedtime as needed for sleep., Disp: , Rfl:   Social History   Tobacco Use  Smoking Status Former   Current packs/day: 0.00   Types: Cigarettes   Quit date: 11/28/1962   Years since quitting: 61.0  Smokeless Tobacco Never    Allergies  Allergen Reactions   Nsaids Nausea And Vomiting    Acid reflux   Penicillins Other (See Comments)    Did it involve swelling of the face/tongue/throat, SOB, or low  BP? Unk Did it involve sudden or severe rash/hives, skin peeling, or any reaction on the inside of your mouth or nose? Unk  Did you need to seek medical attention at a hospital or doctor's office? Unk When did it last happen? Childhood       Has tolerated cephalosporins on several occasions (2023-2024)   Objective:  There were no vitals filed for this visit. There is no height or weight on file to calculate BMI. Constitutional Well developed. Well nourished.  Vascular Foot warm and well perfused. Capillary refill normal to all digits.   Neurologic Normal speech. Oriented to person, place, and time. Epicritic sensation to light touch grossly present bilaterally.  Dermatologic Skin healing well without signs of infection on fifth digit . Skin edges well coapted without signs of infection. Right hallux wound healed. No erythema or edema noted.  No probe to bone. No purulence noted.   Orthopedic: Tenderness to palpation noted about the surgical site.   Radiographs: No major interval changes in X-rays from OR.   Component 7 d ago  Specimen Description WOUND RIGHT TOE  Special Requests RT GREAT TOE HALLUX SWAB SAMPLE C  Gram Stain RARE WBC PRESENT, PREDOMINANTLY PMN RARE GRAM POSITIVE COCCI RARE GRAM NEGATIVE RODS  Culture MODERATE ENTEROBACTER CLOACAE MODERATE GROUP B STREP(S.AGALACTIAE)ISOLATED TESTING AGAINST S. AGALACTIAE NOT ROUTINELY PERFORMED DUE TO PREDICTABILITY OF AMP/PEN/VAN SUSCEPTIBILITY. NO ANAEROBES ISOLATED Performed at [facility], [address], [place], [place] [address]  Report Status 10/07/2023 FINAL  Organism ID, Bacteria ENTEROBACTER CLOACAE   Component 7 d ago  Specimen Description BONE BIOPSY  Special Requests 5TH TOE  Gram Stain NO WBC SEEN NO ORGANISMS SEEN  Culture RARE GROUP B STREP(S.AGALACTIAE)ISOLATED TESTING AGAINST S. AGALACTIAE NOT ROUTINELY PERFORMED DUE TO PREDICTABILITY OF AMP/PEN/VAN SUSCEPTIBILITY. NO ANAEROBES ISOLATED Performed  at [facility], [address] [place], [place] [address]  Report Status 10/08/2023 FINAL      Component 7 d ago  Specimen Description BIOPSY BONE RIGHT TOE  Special Requests RT GREAT TOE SAMPLE A  Gram Stain NO WBC SEEN NO ORGANISMS SEEN  Culture MODERATE ENTEROBACTER CLOACAE FEW GROUP B STREP(S.AGALACTIAE)ISOLATED TESTING AGAINST S. AGALACTIAE NOT ROUTINELY PERFORMED DUE TO PREDICTABILITY OF AMP/PEN/VAN SUSCEPTIBILITY. NO ANAEROBES ISOLATED Performed at [facility], [address], [place], [place] [address]  Report Status 10/07/2023 FINAL  Resulting Agency [facility]           Assessment:   No diagnosis found.  Plan:  Patient was evaluated and treated and all questions answered.  S/p foot surgery right -Progressing as expected post-operatively. No worsening infection noted  -Wound  healed.  -WB Status: WBAT in surgical shoe -Medications: Continue antibitoics per ID Nearly finished has a couple more days. .  -Foot redressed. Will continue to keep some padding and protection on the toe.   Return in 2 weesk for wound check   Return in about 17 days (around 11/30/2023) for wound check.

## 2023-11-14 ENCOUNTER — Other Ambulatory Visit: Payer: Self-pay

## 2023-11-14 ENCOUNTER — Ambulatory Visit (INDEPENDENT_AMBULATORY_CARE_PROVIDER_SITE_OTHER): Payer: Medicaid Other | Admitting: Infectious Diseases

## 2023-11-14 VITALS — BP 108/71 | HR 89 | Temp 98.4°F | Resp 16 | Ht 73.0 in | Wt 245.0 lb

## 2023-11-14 DIAGNOSIS — Z79899 Other long term (current) drug therapy: Secondary | ICD-10-CM

## 2023-11-14 DIAGNOSIS — M869 Osteomyelitis, unspecified: Secondary | ICD-10-CM | POA: Diagnosis present

## 2023-11-14 NOTE — Progress Notes (Addendum)
Patient Active Problem List   Diagnosis Date Noted   Port-A-Cath in place 10/31/2023   Medication management 10/30/2023   Osteomyelitis of fifth toe of right foot (HCC) 10/02/2023   Osteomyelitis of great toe of right foot (HCC) 10/01/2023   Type 2 diabetes mellitus (HCC) 10/01/2023   Diabetic foot ulcer (HCC) 09/30/2023   Diabetic ulcer of toe of right foot (HCC) 12/26/2022   Vitamin B12 deficiency 10/27/2022   Hepatic steatosis 09/15/2022   Hypoglycemia 09/07/2022   Pancytopenia, acquired (HCC) 07/28/2022   Syncope 06/23/2022   Anemia due to antineoplastic chemotherapy 05/19/2022   Insomnia 05/02/2022   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HCC) 04/08/2022   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   Obesity, Class I, BMI 30-34.9 03/08/2022   Visit for routine gyn exam 11/17/2021   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   Thoracic outlet syndrome 07/12/2020   Shoulder pain 05/29/2020   Neck pain 05/06/2020   Encounter for screening mammogram for malignant neoplasm of breast 05/05/2020   Low back pain 01/20/2020   Lumbar radiculopathy 10/28/2019   Strain of lumbar paraspinal muscle, initial encounter 09/10/2019   Long term current use of opiate analgesic 01/31/2017   Morton neuroma 11/02/2016   Diabetic ulcer of toe of left foot associated with diabetes mellitus due to underlying condition (HCC) 11/02/2016   Hyperlipidemia associated with type 2 diabetes mellitus (HCC) 04/22/2016   GERD (gastroesophageal reflux disease) 03/03/2014   Health care maintenance 01/06/2014   Asthma 11/01/2010   Hypertension 11/28/2008   Controlled type 2 diabetes with neuropathy (HCC) 11/28/1988    Patient's Medications  New Prescriptions   No medications on file  Previous Medications   ACCU-CHEK SOFTCLIX LANCETS LANCETS    CHECK BLOOD SUGAR 4 TIMES A DAY BEFORE MEALS AND BEDTIME   ACETAMINOPHEN (TYLENOL) 650 MG CR TABLET    Take 1,300-1,950 mg by mouth every 8 (eight) hours as needed for pain.    ATORVASTATIN (LIPITOR) 80 MG TABLET    Take 1 tablet (80 mg total) by mouth daily.   CEFADROXIL (DURICEF) 500 MG CAPSULE    Take 2 capsules (1,000 mg total) by mouth 2 (two) times daily.   CIPROFLOXACIN (CIPRO) 750 MG TABLET    Take 1 tablet (750 mg total) by mouth 2 (two) times daily.   CYANOCOBALAMIN (VITAMIN B12) 1000 MCG/ML INJECTION    Inject 1 mL (1,000 mcg total) into the muscle every 30 (thirty) days.   GLUCOSE BLOOD (ACCU-CHEK GUIDE) TEST STRIP    Check blood sugar 4 times per day   INSULIN LISPRO (HUMALOG) 100 UNIT/ML INJECTION    Inject 0.07 mLs (7 Units total) into the skin 3 (three) times daily with meals.   INSULIN SYRINGE-NEEDLE U-100 31G X 15/64" 0.5 ML MISC    1 Syringe by Does not apply route 3 (three) times daily.   MECLIZINE (ANTIVERT) 25 MG TABLET    Take 1 tablet (25 mg total) by mouth 2 (two) times daily as needed for dizziness.   METFORMIN (GLUCOPHAGE-XR) 500 MG 24 HR TABLET    Take 4 tablets (2,000 mg total) by mouth at bedtime.   PREGABALIN (LYRICA) 75 MG CAPSULE    Take 1 capsule (75 mg total) by mouth 2 (two) times daily.   SEMAGLUTIDE, 2 MG/DOSE, (OZEMPIC, 2 MG/DOSE,) 8 MG/3ML SOPN    Inject 2 mg into the skin once a week.   SEMGLEE, YFGN, 100 UNIT/ML PEN    Inject 30 Units into the skin every evening.   TRAMADOL (  ULTRAM) 50 MG TABLET    Take 50 mg by mouth every 6 (six) hours as needed for severe pain (pain score 7-10).   ZOLPIDEM (AMBIEN) 10 MG TABLET    Take 10 mg by mouth at bedtime as needed for sleep.  Modified Medications   No medications on file  Discontinued Medications   No medications on file    Subjective: 62 year old female with PMH of DM2 with neuropathy, HTN, prior left first and second toe amputation, endometrial cancer who is here for HFU 11/2-11/8 for rt foot diabetic foot infection/osteomyelitis.   12/2 Getting IV abtx through her port without any concerns related to port or abtx. She has soft stool but no diarrhea, nausea, vomiting. Denies any  fevers, chills. Denies any drainage or pain or swelling in the left great toe or 5th toe. Seen by Podiatry Dr Ralene Cork who said that wound looks ' fabulous and we have turned the corner". Last seen on 11/25 and wound healing with no signs of infection. She was also seen by Oncology Dr Bertis Ruddy carcinosarcoma of endometrium on 11/14 and plan for next imaging in January.   12/17 Taking ciprofloxacin and cefadroxil as instructed but did not take for 2 days as she was fasting, hence, she has 2 more days of abtx left. Her rt foot wound has healed. Last seen by Podiatry 12/16 with no symptoms and signs concerning for infection. She is pleased with the progress. Reports she however has been struggling with yeast infections in her axilla nd beneath her abdominal fold. She has been applying antifungal cream which was prescribed by PCP. No concerns otherwise.   Review of Systems: all systems reviewed including MSK and negative  Past Medical History:  Diagnosis Date   Anemia    Asthma 11/01/2010   only when respiratory infections   BPPV (benign paroxysmal positional vertigo) 03/07/2013   Broken teeth 11/02/2016   Callus of toe 07/07/2022   Colon cancer screening 05/05/2020   Diabetes mellitus    since 1983   Dizziness 09/22/2021   Elevated TSH 05/13/2013   Endometrial cancer (HCC)    Gangrene of toe of right foot (HCC) 04/14/2021   GERD (gastroesophageal reflux disease)    Hand pain, right 03/14/2019   History of radiation therapy    Vagina- 06/13/22-07/11/22-Dr. 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   Qualifier: Diagnosis of  By: Daphine Deutscher FNP, Nykedtra     Osteomyelitis [facility])    Pneumonia    as a child   Wrist pain 04/11/2013   Past Surgical History:  Procedure Laterality Date   ABDOMINAL HYSTERECTOMY     ACHILLES TENDON LENGTHENING Left 08/29/2014   Procedure: Left Achilles  Lengthening;  Surgeon: Nadara Mustard, MD;  Location: [facility] OR;  Service: Orthopedics;  Laterality: Left;   AMPUTATION Left 02/19/2013   Procedure: Amputation of Left Great Toe;  Surgeon: Kathryne Hitch, MD;  Location: [facility] OR;  Service: Orthopedics;  Laterality: Left;   AMPUTATION Left 08/29/2014   Procedure: Left Foot 2nd and 1st Toe Amputation;  Surgeon: Nadara Mustard, MD;  Location: [facility] OR;  Service: Orthopedics;  Laterality: Left;   CARDIAC CATHETERIZATION     15 years ago   CESAREAN SECTION     x 4   CYSTOSCOPY  03/30/2022   Procedure: CYSTOSCOPY;  Surgeon: Carver Fila, MD;  Location: [facility] ORS;  Service: Gynecology;;   IR IMAGING GUIDED PORT INSERTION  04/19/2022   IRRIGATION AND DEBRIDEMENT FOOT Right 10/02/2023   Procedure: IRRIGATION AND DEBRIDEMENT FOOT, bone biopsy RIGHT GREAT TOE AND RIGHT 5TH TOE;  Surgeon: Pilar Plate, DPM;  Location: [facility] OR;  Service: Orthopedics/Podiatry;  Laterality: Right;   ROBOTIC ASSISTED TOTAL HYSTERECTOMY WITH BILATERAL SALPINGO OOPHERECTOMY Bilateral 03/30/2022   Procedure: XI ROBOTIC ASSISTED TOTAL HYSTERECTOMY WITH BILATERAL SALPINGO-OOPHORECTOMY, MINI LAPAROTOMY;  Surgeon: Carver Fila, MD;  Location: [facility] ORS;  Service: Gynecology;  Laterality: Bilateral;   SENTINEL NODE BIOPSY N/A 03/30/2022   Procedure: SENTINEL NODE BIOPSY;  Surgeon: Carver Fila, MD;  Location: [facility] ORS;  Service: Gynecology;  Laterality: N/A;   TUBAL LIGATION     at one of c-sections    Social History   Tobacco Use   Smoking status: Former    Current packs/day: 0.00    Types: Cigarettes    Quit date: 11/28/1962    Years since quitting: 61.0   Smokeless tobacco: Never  Vaping Use   Vaping status: Never Used  Substance Use Topics   Alcohol use: No    Alcohol/week: 0.0 standard drinks of alcohol   Drug use: No    Family History  Problem Relation Age of Onset   Hypertension Mother    Dementia Mother    Heart attack Father    Heart  disease Father    Diabetes Father    Hypertension Father    Diabetes Daughter    Colon cancer Neg Hx    Breast cancer Neg Hx    Ovarian cancer Neg Hx    Endometrial cancer Neg Hx    Pancreatic cancer Neg Hx    Prostate cancer Neg Hx     Allergies  Allergen Reactions   Nsaids Nausea And Vomiting    Acid reflux   Penicillins Other (See Comments)    Did it involve swelling of the face/tongue/throat, SOB, or low BP? Unk Did it involve sudden or severe rash/hives, skin peeling, or any reaction on the inside of your mouth or nose? Unk  Did you need to seek medical attention at a hospital or doctor's office? Unk When did it last happen? Childhood       Has tolerated cephalosporins on several occasions (2023-2024)    Health Maintenance  Topic Date Due   Colonoscopy  Never done   Diabetic kidney evaluation - Urine ACR  10/09/2018   FOOT EXAM  04/14/2022   LIPID PANEL  09/22/2022   DTaP/Tdap/Td (2 - Td or Tdap) 03/01/2023   COVID-19 Vaccine (4 - 2024-25 season) 10/10/2023   MAMMOGRAM  12/17/2023   OPHTHALMOLOGY EXAM  12/29/2023   HEMOGLOBIN A1C  01/01/2024   Diabetic kidney evaluation - eGFR measurement  10/03/2024   INFLUENZA VACCINE  Completed   Hepatitis C Screening  Completed   HIV Screening  Completed   Zoster Vaccines- Shingrix  Completed   HPV VACCINES  Aged Out    Objective:  Vitals:   11/14/23 1052  BP: 108/71  Pulse: 89  Resp: 16  Temp: 98.4 F (36.9 C)  TempSrc: Temporal  SpO2: 99%  Weight: 245 lb (111.1 kg)  Height: 6\' 1"  (1.854 m)   Body mass index is 32.32 kg/m.  Physical Exam Constitutional:      Appearance: Normal appearance.  HENT:     Head: Normocephalic and atraumatic.      Mouth: Mucous membranes are moist.  Eyes:    Conjunctiva/sclera: Conjunctivae normal.  Pupils: Pupils are equal, round, and bilaterally symmetrical  Cardiovascular:     Rate and Rhythm: Normal rate and regular rhythm.     Heart sounds:   Pulmonary:     Effort:  Pulmonary effort is normal.     Breath sounds:   Abdominal:     General: Non distended     Palpations:   Musculoskeletal:        General: Normal range of motion.   Rt foot Great toe wound healed with no signs of infection Rt 5th toe wound has healed  Skin:    General: Skin is warm and dry.     Comments:  Neurological:     General: grossly non focal     Mental Status: awake, alert and oriented to person, place, and time.   Psychiatric:        Mood and Affect: Mood normal.   Lab Results Lab Results  Component Value Date   WBC 3.6 (L) 10/04/2023   HGB 9.7 (L) 10/04/2023   HCT 30.4 (L) 10/04/2023   MCV 99.0 10/04/2023   PLT 192 10/04/2023    Lab Results  Component Value Date   CREATININE 0.88 10/04/2023   BUN 15 10/04/2023   NA 136 10/04/2023   K 4.3 10/04/2023   CL 103 10/04/2023   CO2 24 10/04/2023    Lab Results  Component Value Date   ALT 14 09/30/2023   AST 17 09/30/2023   ALKPHOS 78 09/30/2023   BILITOT 0.3 09/30/2023    Lab Results  Component Value Date   CHOL 210 (H) 09/22/2021   HDL 59 09/22/2021   LDLCALC 125 (H) 09/22/2021   TRIG 148 09/22/2021   CHOLHDL 3.6 09/22/2021   No results found for: "LABRPR", "RPRTITER" No results found for: "HIV1RNAQUANT", "HIV1RNAVL", "CD4TABS"  Assessment/Plan # DFU/acute osteomyelitis of rt great toe distal phalanx # DFU/acute osteomyelitis of proximal and distal phalanx of 5th toe  -11/4 S/p I&D, distal phalanx hallux bone biopsy as well as fifth toe middle phalanx bone biopsy.  IV antibiotics were held preoperative to allow for improved intraoperative cultures. 11/4 Or cx enterobacter cloacae, Group B strep - 5/22 ABI -no evidence of significant arterial disease in the lower extremities - IV ertapenem until 12/2 > ciprofloxacin and cefadroxil for 2 weeks   Plan - complete course of ciprofloxacin and cefadroxil  - Discussed about symptoms and signs that could be suggestive of recurrence of infection  - Fu  with Podiatry as instructed   # Medication management  - no need for labs today as stopping abtx in 2 days   # Port  - no concerns   # DM2 w neuropathy  - A1c 6.2 - fu with PCP  # 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- Follow Dr Bertis Ruddy, next imaging in [place]  I have personally spent 30 minutes involved in face-to-face and non-face-to-face activities for this patient on the day of the visit. Professional time spent includes the following activities: Preparing to see the patient (review of tests), Obtaining and/or reviewing separately obtained history (admission/discharge record), Performing a medically appropriate examination and/or evaluation , Ordering medications/tests/procedures, referring and communicating with other health care professionals, Documenting clinical information in the EMR, Independently interpreting results (not separately reported), Communicating results to the patient/family/caregiver, Counseling and educating the patient/family/caregiver and Care coordination (not separately reported).   Of note, portions of this note may have been created with voice recognition software. While this note has been edited for accuracy, occasional wrong-word or 'sound-a-like'  substitutions may have occurred due to the inherent limitations of voice recognition software.   Victoriano Lain, MD [facility] 11/14/2023, 10:58 AM

## 2023-12-05 ENCOUNTER — Inpatient Hospital Stay: Payer: Medicaid Other | Attending: Gynecologic Oncology

## 2023-12-05 ENCOUNTER — Ambulatory Visit (HOSPITAL_COMMUNITY)
Admission: RE | Admit: 2023-12-05 | Discharge: 2023-12-05 | Disposition: A | Discharge status: Home / Self Care | Payer: Medicaid Other | Source: Ambulatory Visit | Attending: Hematology and Oncology | Admitting: Hematology and Oncology

## 2023-12-05 ENCOUNTER — Ambulatory Visit (INDEPENDENT_AMBULATORY_CARE_PROVIDER_SITE_OTHER): Payer: Medicaid Other | Admitting: Podiatry

## 2023-12-05 DIAGNOSIS — D519 Vitamin B12 deficiency anemia, unspecified: Secondary | ICD-10-CM | POA: Insufficient documentation

## 2023-12-05 DIAGNOSIS — L84 Corns and callosities: Secondary | ICD-10-CM

## 2023-12-05 DIAGNOSIS — C541 Malignant neoplasm of endometrium: Secondary | ICD-10-CM | POA: Insufficient documentation

## 2023-12-05 DIAGNOSIS — E538 Deficiency of other specified B group vitamins: Secondary | ICD-10-CM

## 2023-12-05 DIAGNOSIS — Z452 Encounter for adjustment and management of vascular access device: Secondary | ICD-10-CM | POA: Insufficient documentation

## 2023-12-05 DIAGNOSIS — E1140 Type 2 diabetes mellitus with diabetic neuropathy, unspecified: Secondary | ICD-10-CM

## 2023-12-05 DIAGNOSIS — Z87898 Personal history of other specified conditions: Secondary | ICD-10-CM

## 2023-12-05 LAB — CBC WITH DIFFERENTIAL/PLATELET
Abs Immature Granulocytes: 0.01 10*3/uL (ref 0.00–0.07)
Basophils Absolute: 0 10*3/uL (ref 0.0–0.1)
Basophils Relative: 0 %
Eosinophils Absolute: 0.1 10*3/uL (ref 0.0–0.5)
Eosinophils Relative: 1 %
HCT: 32.8 % — ABNORMAL LOW (ref 36.0–46.0)
Hemoglobin: 11 g/dL — ABNORMAL LOW (ref 12.0–15.0)
Immature Granulocytes: 0 %
Lymphocytes Relative: 44 %
Lymphs Abs: 2.2 10*3/uL (ref 0.7–4.0)
MCH: 32.4 pg (ref 26.0–34.0)
MCHC: 33.5 g/dL (ref 30.0–36.0)
MCV: 96.5 fL (ref 80.0–100.0)
Monocytes Absolute: 0.3 10*3/uL (ref 0.1–1.0)
Monocytes Relative: 6 %
Neutro Abs: 2.4 10*3/uL (ref 1.7–7.7)
Neutrophils Relative %: 49 %
Platelets: 189 10*3/uL (ref 150–400)
RBC: 3.4 MIL/uL — ABNORMAL LOW (ref 3.87–5.11)
RDW: 13 % (ref 11.5–15.5)
WBC: 4.9 10*3/uL (ref 4.0–10.5)
nRBC: 0 % (ref 0.0–0.2)

## 2023-12-05 LAB — COMPREHENSIVE METABOLIC PANEL
ALT: 11 U/L (ref 0–44)
AST: 12 U/L — ABNORMAL LOW (ref 15–41)
Albumin: 4.1 g/dL (ref 3.5–5.0)
Alkaline Phosphatase: 86 U/L (ref 38–126)
Anion gap: 7 (ref 5–15)
BUN: 13 mg/dL (ref 8–23)
CO2: 27 mmol/L (ref 22–32)
Calcium: 9.1 mg/dL (ref 8.9–10.3)
Chloride: 107 mmol/L (ref 98–111)
Creatinine, Ser: 0.69 mg/dL (ref 0.44–1.00)
GFR, Estimated: >60 mL/min (ref >60)
Glucose, Bld: 102 mg/dL — ABNORMAL HIGH (ref 70–99)
Potassium: 4.1 mmol/L (ref 3.5–5.1)
Sodium: 141 mmol/L (ref 135–145)
Total Bilirubin: 0.4 mg/dL (ref 0.0–1.2)
Total Protein: 7.1 g/dL (ref 6.5–8.1)

## 2023-12-05 LAB — VITAMIN B12: Vitamin B-12: 305 pg/mL (ref 180–914)

## 2023-12-05 MED ORDER — SODIUM CHLORIDE 0.9% FLUSH
10.0000 mL | Freq: Once | INTRAVENOUS | Status: AC
  Administered 2023-12-05: 10 mL

## 2023-12-05 MED ORDER — HEPARIN SOD (PORK) LOCK FLUSH 100 UNIT/ML IV SOLN
500.0000 [IU] | Freq: Once | INTRAVENOUS | Status: AC
  Administered 2023-12-05: 500 [IU] via INTRAVENOUS

## 2023-12-05 MED ORDER — IOHEXOL 300 MG/ML  SOLN
100.0000 mL | Freq: Once | INTRAMUSCULAR | Status: AC | PRN
  Administered 2023-12-05: 100 mL via INTRAVENOUS

## 2023-12-05 MED ORDER — HEPARIN SOD (PORK) LOCK FLUSH 100 UNIT/ML IV SOLN
INTRAVENOUS | Status: AC
  Filled 2023-12-05: qty 5

## 2023-12-05 NOTE — Progress Notes (Signed)
 Subjective:  [name], female    DOB: 01-20-1961,  MRN: [id number]  No chief complaint on file.   DOS: 10/02/23 Procedure: Right great and right fifth toe irrigation and debridement with bone biopsy. With Dr. Malvin   63 y.o. female returns for POV#4. Relates doing well. Finished oral antibiotics. No issues.   Review of Systems: Negative except as noted in the HPI. Denies N/V/F/Ch.  Past Medical History:  Diagnosis Date   Anemia    Asthma 11/01/2010   only when respiratory infections   BPPV (benign paroxysmal positional vertigo) 03/07/2013   Broken teeth 11/02/2016   Callus of toe 07/07/2022   Colon cancer screening 05/05/2020   Diabetes mellitus    since 1983   Dizziness 09/22/2021   Elevated TSH 05/13/2013   Endometrial cancer (HCC)    Gangrene of toe of right foot (HCC) 04/14/2021   GERD (gastroesophageal reflux disease)    Hand pain, right 03/14/2019   History of radiation therapy    Vagina- 06/13/22-07/11/22-Dr. 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   Qualifier: Diagnosis of  By: Gladis FNP, Nykedtra     Osteomyelitis [facility])    Pneumonia    as a child   Wrist pain 04/11/2013    Current Outpatient Medications:    Accu-Chek Softclix Lancets lancets, CHECK BLOOD SUGAR 4 TIMES A DAY BEFORE MEALS AND BEDTIME, Disp: 204 each, Rfl: 10   acetaminophen  (TYLENOL ) 650 MG CR tablet, Take 1,300-1,950 mg by mouth every 8 (eight) hours as needed for pain., Disp: , Rfl:    atorvastatin  (LIPITOR ) 80 MG tablet, Take 1 tablet (80 mg total) by mouth daily., Disp: 90 tablet, Rfl: 3   cefadroxil  (DURICEF) 500 MG capsule, Take 2 capsules (1,000 mg total) by mouth 2 (two) times daily., Disp: 56 capsule, Rfl: 0   ciprofloxacin  (CIPRO ) 750 MG tablet, Take 1 tablet (750 mg total) by mouth 2 (two) times daily., Disp: 28 tablet, Rfl: 0   cyanocobalamin   (VITAMIN B12) 1000 MCG/ML injection, Inject 1 mL (1,000 mcg total) into the muscle every 30 (thirty) days., Disp: 1 mL, Rfl: 11   glucose blood (ACCU-CHEK GUIDE) test strip, Check blood sugar 4 times per day, Disp: 125 each, Rfl: 12   insulin  lispro (HUMALOG ) 100 UNIT/ML injection, Inject 0.07 mLs (7 Units total) into the skin 3 (three) times daily with meals., Disp: 10 mL, Rfl: 3   Insulin  Syringe-Needle U-100 31G X 15/64 0.5 ML MISC, 1 Syringe by Does not apply route 3 (three) times daily., Disp: 50 each, Rfl: 3   meclizine  (ANTIVERT ) 25 MG tablet, Take 1 tablet (25 mg total) by mouth 2 (two) times daily as needed for dizziness., Disp: 60 tablet, Rfl: 3   metFORMIN  (GLUCOPHAGE -XR) 500 MG 24 hr tablet, Take 4 tablets (2,000 mg total) by mouth at bedtime., Disp: 360 tablet, Rfl: 3   pregabalin  (LYRICA ) 75 MG capsule, Take 1 capsule (75 mg total) by mouth 2 (two) times daily. (Patient taking differently: Take 150 mg by mouth at bedtime.), Disp: 60 capsule, Rfl: 3   Semaglutide , 2 MG/DOSE, (OZEMPIC , 2 MG/DOSE,) 8 MG/3ML SOPN, Inject 2 mg into the skin once a week., Disp: 3 mL, Rfl: 3   SEMGLEE , YFGN, 100 UNIT/ML Pen, Inject 30 Units into the skin every evening., Disp: , Rfl:    traMADol  (ULTRAM ) 50 MG tablet, Take  50 mg by mouth every 6 (six) hours as needed for severe pain (pain score 7-10)., Disp: , Rfl:    zolpidem  (AMBIEN ) 10 MG tablet, Take 10 mg by mouth at bedtime as needed for sleep., Disp: , Rfl:   Social History   Tobacco Use  Smoking Status Former   Current packs/day: 0.00   Types: Cigarettes   Quit date: 11/28/1962   Years since quitting: 61.0  Smokeless Tobacco Never    Allergies  Allergen Reactions   Nsaids Nausea And Vomiting    Acid reflux   Penicillins Other (See Comments)    Did it involve swelling of the face/tongue/throat, SOB, or low BP? Unk Did it involve sudden or severe rash/hives, skin peeling, or any reaction on the inside of your mouth or nose? Unk  Did you need  to seek medical attention at a hospital or doctor's office? Unk When did it last happen? Childhood       Has tolerated cephalosporins on several occasions (2023-2024)   Objective:  There were no vitals filed for this visit. There is no height or weight on file to calculate BMI. Constitutional Well developed. Well nourished.  Vascular Foot warm and well perfused. Capillary refill normal to all digits.   Neurologic Normal speech. Oriented to person, place, and time. Epicritic sensation to light touch grossly present bilaterally.  Dermatologic Skin healing well without signs of infection on fifth digit . Skin edges well coapted without signs of infection. Right hallux wound healed. No erythema or edema noted.  No probe to bone. No purulence noted.   Orthopedic: Tenderness to palpation noted about the surgical site.   Radiographs: No major interval changes in X-rays from OR.   Component 7 d ago  Specimen Description WOUND RIGHT TOE  Special Requests RT GREAT TOE HALLUX SWAB SAMPLE C  Gram Stain RARE WBC PRESENT, PREDOMINANTLY PMN RARE GRAM POSITIVE COCCI RARE GRAM NEGATIVE RODS  Culture MODERATE ENTEROBACTER CLOACAE MODERATE GROUP B STREP(S.AGALACTIAE)ISOLATED TESTING AGAINST S. AGALACTIAE NOT ROUTINELY PERFORMED DUE TO PREDICTABILITY OF AMP/PEN/VAN SUSCEPTIBILITY. NO ANAEROBES ISOLATED Performed at [facility], [address] [place], [place] [address]  Report Status 10/07/2023 FINAL  Organism ID, Bacteria ENTEROBACTER CLOACAE   Component 7 d ago  Specimen Description BONE BIOPSY  Special Requests 5TH TOE  Gram Stain NO WBC SEEN NO ORGANISMS SEEN  Culture RARE GROUP B STREP(S.AGALACTIAE)ISOLATED TESTING AGAINST S. AGALACTIAE NOT ROUTINELY PERFORMED DUE TO PREDICTABILITY OF AMP/PEN/VAN SUSCEPTIBILITY. NO ANAEROBES ISOLATED Performed at [facility], [address] [place], [place] [address]  Report Status 10/08/2023 FINAL      Component 7 d ago  Specimen  Description BIOPSY BONE RIGHT TOE  Special Requests RT GREAT TOE SAMPLE A  Gram Stain NO WBC SEEN NO ORGANISMS SEEN  Culture MODERATE ENTEROBACTER CLOACAE FEW GROUP B STREP(S.AGALACTIAE)ISOLATED TESTING AGAINST S. AGALACTIAE NOT ROUTINELY PERFORMED DUE TO PREDICTABILITY OF AMP/PEN/VAN SUSCEPTIBILITY. NO ANAEROBES ISOLATED Performed at [facility], [address] [place], [place] [address]  Report Status 10/07/2023 FINAL  Resulting Agency [facility]           Assessment:   1. Pre-ulcerative calluses   2. History of ulceration   3. Controlled type 2 diabetes with neuropathy [facility])     Plan:  Patient was evaluated and treated and all questions answered.  S/p foot surgery right -Progressing as expected post-operatively. No worsening infection noted  -Wound healed.  -WB Status: WBAT in surgical shoe -Medications: Has finished  antibiotics.   -Foot redressed. Will continue to keep some padding and protection on the toe.   Return in 4 weesk for wound check   Return in about 4 weeks (around 01/02/2024) for wound check.

## 2023-12-12 ENCOUNTER — Inpatient Hospital Stay (HOSPITAL_BASED_OUTPATIENT_CLINIC_OR_DEPARTMENT_OTHER): Payer: Medicaid Other | Admitting: Hematology and Oncology

## 2023-12-12 ENCOUNTER — Encounter: Payer: Self-pay | Admitting: Hematology and Oncology

## 2023-12-12 ENCOUNTER — Inpatient Hospital Stay: Payer: Medicaid Other

## 2023-12-12 VITALS — BP 132/87 | HR 89 | Resp 18 | Ht 73.0 in | Wt 245.0 lb

## 2023-12-12 DIAGNOSIS — E538 Deficiency of other specified B group vitamins: Secondary | ICD-10-CM

## 2023-12-12 DIAGNOSIS — C541 Malignant neoplasm of endometrium: Secondary | ICD-10-CM

## 2023-12-12 DIAGNOSIS — D638 Anemia in other chronic diseases classified elsewhere: Secondary | ICD-10-CM | POA: Insufficient documentation

## 2023-12-12 MED ORDER — CYANOCOBALAMIN 1000 MCG/ML IJ SOLN
1000.0000 ug | Freq: Once | INTRAMUSCULAR | Status: AC
Start: 2023-12-12 — End: 2023-12-12
  Administered 2023-12-12: 1000 ug via INTRAMUSCULAR
  Filled 2023-12-12: qty 1

## 2023-12-12 MED ORDER — CYANOCOBALAMIN 1000 MCG/ML IJ SOLN: 1000.0000 ug | INTRAMUSCULAR | 11 refills | Status: AC

## 2023-12-12 NOTE — Assessment & Plan Note (Signed)
 She is not symptomatic CT imaging showed no evidence of disease We discussed risk and benefits of port maintenance I plan to repeat imaging study again in 6 months, due in July 2025

## 2023-12-12 NOTE — Progress Notes (Signed)
 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[facility]) She is not symptomatic CT imaging showed no evidence of disease We discussed risk and benefits of port maintenance I plan to repeat imaging study again in 6 months, due in July 2025  Vitamin B12 deficiency She had received many doses of vitamin B12 injection Repeat labs from a week ago showed adequate replacement She will continue monthly B12 injections We will teach the patient how to give B12 injection so she can self administer at home  Anemia, chronic disease This is multifactorial, due to history of B12 deficiency as well as anemia chronic illness She is not symptomatic Observe only Overall, it is improving  No orders of the defined types were placed in this encounter.   All questions were answered. The patient knows to call the clinic with any problems, questions or concerns. The total time spent in the appointment was 30 minutes encounter with patients including review of chart and various tests results, discussions about plan of care and coordination of care plan   Almarie Bedford, MD 12/12/2023 11:51 AM  INTERVAL HISTORY: Please see below for problem oriented charting. she returns for surveillance follow-up and review of CT imaging results She is doing well We discussed test results and future follow-up The patient developed osteomyelitis several months ago but has completed oral antibiotics  REVIEW OF SYSTEMS:   Constitutional: Denies fevers, chills or abnormal weight loss Eyes: Denies blurriness of vision Ears, nose, mouth, throat, and face: Denies mucositis or sore throat Respiratory: Denies cough, dyspnea or wheezes Cardiovascular: Denies palpitation, chest discomfort or lower extremity swelling Gastrointestinal:  Denies nausea, heartburn or change in bowel habits Skin: Denies abnormal  skin rashes Lymphatics: Denies new lymphadenopathy or easy bruising Neurological:Denies numbness, tingling or new weaknesses Behavioral/Psych: Mood is stable, no new changes  All other systems were reviewed with the patient and are negative.  I have reviewed the past medical history, past surgical history, social history and family history with the patient and they are unchanged from previous note.  ALLERGIES:  is allergic to nsaids and penicillins.  MEDICATIONS:  Current Outpatient Medications  Medication Sig Dispense Refill   Accu-Chek Softclix Lancets lancets CHECK BLOOD SUGAR 4 TIMES A DAY BEFORE MEALS AND BEDTIME 204 each 10   acetaminophen  (TYLENOL ) 650 MG CR tablet Take 1,300-1,950 mg by mouth every 8 (eight) hours as needed for pain.     atorvastatin  (LIPITOR ) 80 MG tablet Take 1 tablet (80 mg total) by mouth daily. 90 tablet 3   cyanocobalamin  (VITAMIN B12) 1000 MCG/ML injection Inject 1 mL (1,000 mcg total) into the muscle every 30 (thirty) days. 1 mL 11   glucose blood (ACCU-CHEK GUIDE) test strip Check blood sugar 4 times per day 125 each 12   insulin  lispro (HUMALOG ) 100 UNIT/ML injection Inject 0.07 mLs (7 Units total) into the skin 3 (three) times daily with meals. 10 mL 3   Insulin  Syringe-Needle U-100 31G X 15/64 0.5 ML MISC 1 Syringe by Does not apply route 3 (three) times daily. 50 each 3   meclizine  (ANTIVERT ) 25 MG tablet Take 1 tablet (25 mg total) by mouth 2 (two) times daily as needed for dizziness. 60 tablet 3   metFORMIN  (GLUCOPHAGE -XR) 500 MG 24 hr tablet Take 4 tablets (2,000 mg total) by mouth at bedtime. 360 tablet 3   pregabalin  (LYRICA ) 75 MG capsule Take  1 capsule (75 mg total) by mouth 2 (two) times daily. (Patient taking differently: Take 150 mg by mouth at bedtime.) 60 capsule 3   Semaglutide , 2 MG/DOSE, (OZEMPIC , 2 MG/DOSE,) 8 MG/3ML SOPN Inject 2 mg into the skin once a week. 3 mL 3   SEMGLEE , YFGN, 100 UNIT/ML Pen Inject 30 Units into the skin every  evening.     traMADol  (ULTRAM ) 50 MG tablet Take 50 mg by mouth every 6 (six) hours as needed for severe pain (pain score 7-10).     zolpidem  (AMBIEN ) 10 MG tablet Take 10 mg by mouth at bedtime as needed for sleep.     No current facility-administered medications for this visit.   Facility-Administered Medications Ordered in Other Visits  Medication Dose Route Frequency Provider Last Rate Last Admin   cyanocobalamin  (VITAMIN B12) injection 1,000 mcg  1,000 mcg Intramuscular Once Tsugio Elison, MD        SUMMARY OF ONCOLOGIC HISTORY: Oncology History Overview Note  P53 wild type   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(HCC)  02/01/2022 Initial Diagnosis   She presented with postmenopausal bleeding   02/07/2022 Imaging   US  pelvis Thickened heterogeneous endometrium with masslike region with internal vascularity. Findings may be associated with endometrial carcinoma or hyperplasia. Biopsy is recommended for further evaluation.   02/22/2022 Initial Biopsy   EMB: MMMT/carcinosarcoma (predominance of sarcomatous component)   03/05/2022 Imaging   Ct chest, abdomen and pelvis 1. Heterogeneous enlargement of the endometrial cavity is compatible with the reported history of uterine carcinosarcoma. No definite involvement of the posterior bladder wall or sigmoid colon/rectum. 2. 9 mm short axis right external iliac node is upper normal for size. Attention on follow-up recommended. Otherwise no lymphadenopathy in the abdomen or pelvis. 3. 3.6 x 2.7 cm lesion in the lateral segment left liver has subtle peripheral nodular enhancement. This is almost certainly a benign cavernous hemangioma, but MRI abdomen with and without contrast recommended to confirm. 4. Aortic Atherosclerosis (ICD10-I70.0).   03/08/2022 Initial Diagnosis   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(HCC)   03/30/2022 Surgery   TRH/BSO, SLN biopsy on right, left pelvic LND, LOS, mini-lap  Findings: On EUA, 12cm minimally mobile uterus. On  intra-abdominal exam, normal upper abdominal survey. Omentum adherent to the anterior abdominal wall along the prior midline incision. Normal omentum otherwise, normal small and large bowel. 12 cm uterus densely adherent to the anterior abdominal wall, obliterating some of the anterior anatomy including the anterior cul de sac. Normal appearing bilateral adnexa. Mapping successful to right obturator SLN, mildly enlarged. Dye seen within the parametrium on the left, no SLNs identified. No obvious adenopathy on the left. Decision made given length of surgery, comorbitidies to defer left para-aortic lymphadenectomy. Mini-lap required for specimen delivery. Dome intact on cystoscopy and good efflux noted from bilateral ureteral orifices.   03/30/2022 Pathology Results   MMMT/carcinosarcoma, 6.3 cm MI 1cm or 3 (<50%) Cervical stroma, bilateral tubes/ovaries benign R SLN and L pelvic LNDs negative  ONCOLOGY TABLE:   UTERUS, CARCINOMA OR CARCINOSARCOMA: Resection   Procedure: Total hysterectomy and bilateral salpingo-oophorectomy  Histologic Type:  Malignant mixed Mullerian tumor (MMMT/ carcinosarcoma)  Histologic Grade: High-grade  Myometrial Invasion:       Depth of Myometrial Invasion (mm): 10 mm       Myometrial Thickness (mm): 30 mm       Percentage of Myometrial Invasion: 33%  Uterine Serosa Involvement: Not identified  Cervical stromal Involvement: Not identified  Extent of involvement of other tissue/organs: Not identified  Peritoneal/Ascitic Fluid: Not applicable  Lymphovascular Invasion: Not identified  Regional Lymph Nodes:       Pelvic Lymph Nodes Examined:                                   1 Sentinel                                   6 Non-sentinel                                   7 Total       Pelvic Lymph Nodes with Metastasis: 0                           Macrometastasis: (>2.0 mm): 0                           Micrometastasis: (>0.2 mm and < 2.0 mm): 0                            Isolated Tumor Cells (<0.2 mm): 0                           Laterality of Lymph Node with Tumor: Not  applicable                           Extracapsular Extension: Not applicable       Para-aortic Lymph Nodes Examined:                                    0 Sentinel                                    0 non-sentinel                                    0 total  Distant Metastasis:       Distant Site(s) Involved: Not applicable  Pathologic Stage Classification (pTNM, AJCC 8th Edition): pT1a, pN0  Ancillary Studies: MMR / MSI testing will be ordered  Representative Tumor Block: B1  Comment(s): Pancytokeratin was performed on the lymph nodes and is  negative.    04/08/2022 Cancer Staging   Staging form: Corpus Uteri - Carcinoma and Carcinosarcoma, AJCC 8th Edition - Pathologic stage from 04/08/2022: FIGO Stage IA (pT1a, pN0, cM0) - Signed by Lonn Hicks, MD on 04/08/2022 Stage prefix: Initial diagnosis   04/21/2022 Procedure   Placement of single lumen port a cath via right internal jugular vein. The catheter tip lies at the cavo-atrial junction. A power injectable port a cath was placed and is ready for immediate use.       04/29/2022 - 08/18/2022 Chemotherapy   Patient is on Treatment Plan : UTERINE Carboplatin  AUC 6 / Paclitaxel  q21d     06/13/2022 - 07/11/2022 Radiation Therapy   06/13/2022 through 07/11/2022 Site  Technique Total Dose (Gy) Dose per Fx (Gy) Completed Fx Beam Energies  Vagina: Pelvis HDR-brachy 30/30 6 5/5 Ir-192     09/14/2022 Imaging   Narrative & Impression   EXAM: CT ABDOMEN AND PELVIS WITH CONTRAST   TECHNIQUE: Multidetector CT imaging of the abdomen and pelvis was performed using the standard protocol following bolus administration of intravenous contrast.   RADIATION DOSE REDUCTION: This exam was performed according to the departmental dose-optimization program which includes automated exposure control, adjustment of the mA and/or kV according to patient size  and/or use of iterative reconstruction technique.   CONTRAST:  OMNIPAQUE  IOHEXOL  300 MG/ML  SOLN   COMPARISON:  March 04, 2022   FINDINGS: Lower chest: Basilar atelectasis. No effusion or consolidative changes.   Hepatobiliary: LEFT hepatic lobe lesion at 3.4 cm is unchanged since April of 2023 with peripheral and nodular enhancement and centripetal filling noted on previous imaging compatible with large hepatic hemangioma.   Small hypodensity in the posterior RIGHT hemiliver not definitely seen on previous imaging (image 19/2) 8 mm, perhaps present on the prior study but masked by artifact from the patient's arm.   Low-density focus in the posterior RIGHT hemiliver, hepatic subsegment VII (image 16/2) 11 mm, this is stable. There is background hepatic steatosis.   Subtle area of low attenuation in the RIGHT hemiliver (image 31/2) 9 mm. Portal vein is patent. No pericholecystic stranding or signs of biliary duct distension.   Pancreas: Normal, without mass, inflammation or ductal dilatation.   Spleen: Normal.   Adrenals/Urinary Tract:   Adrenal glands are unremarkable. Symmetric renal enhancement. No sign of hydronephrosis. No suspicious renal lesion or perinephric stranding.   Urinary bladder is grossly unremarkable.   Stomach/Bowel: No acute gastrointestinal findings. Appendix is normal.   Vascular/Lymphatic:   Aortic atherosclerosis. No sign of aneurysm. Smooth contour of the IVC. There is no gastrohepatic or hepatoduodenal ligament lymphadenopathy. No retroperitoneal or mesenteric lymphadenopathy.   No pelvic sidewall lymphadenopathy.   Mild expansion of the RIGHT ovarian vein with central low attenuation that suggests thrombus within ligated ovarian vein. Delayed images without signs of propagation into the IVC.   Reproductive: Post hysterectomy. Small amount of fluid in the surgical bed (image 81/2) no peripheral enhancement. This area measuring 4.2 x 1.9 cm. Scattered  areas of fluid density which are smaller, other areas less than a cm. Mild fascial thickening along the RIGHT pelvic sidewall. (Image 75/2) mild serosal thickening of the sigmoid colon is suggested. No peritoneal nodularity outside of the pelvis.   Other: As above   Musculoskeletal: No acute bone finding. No destructive bone process. Spinal degenerative changes.   IMPRESSION: 1. Post hysterectomy. Small amount of fluid in the surgical bed no peripheral enhancement. This area measuring 4.2 x 1.9 cm. This and other findings in the pelvis such as serosal thickening along the sigmoid and fascial thickening along the RIGHT pelvis may reflect postoperative changes. Would suggest close attention on follow-up to exclude the possibility of peritoneal involvement. 2. Mild expansion of the RIGHT ovarian vein with central low attenuation suggests ovarian venous thrombus. 3. Hepatic lesions 2 of which were definitively present on prior imaging in hepatic subsegment VII and in the lateral segment of the LEFT hepatic lobe, lateral segment LEFT hepatic lobe lesion is compatible with benign hemangioma. There are 2 lesions which may be new and or mast on the prior study by artifact and are indeterminate. Suggest hepatic MRI for further assessment. At the time of hepatic MRI, MRV  sequences could be obtained if warranted to assess for RIGHT ovarian venous thrombosis. 4. Background hepatic steatosis. 5. Aortic atherosclerosis, mild.     12/14/2022 Imaging   CT scan  Again surgical changes from previous hysterectomy. The previous fluid in the dependent pelvis is improved with some residual stranding. Slight soft tissue thickening along the fascial planes of the pelvis but no discrete mass lesion or lymph node enlargement.   Stable in the right gonadal, ovarian vein. Small amount of thrombus is possible and stable.   Fatty liver infiltration with a segment 2 hemangioma. Faint low-attenuation small foci in segment 7  of the liver on the prior today 1 is stable in the other is not as well seen. Recommend continued surveillance   Aortic atherosclerosis    12/05/2023 Imaging   CT ABDOMEN PELVIS W CONTRAST Result Date: 12/09/2023 CLINICAL DATA:  History of endometrial cancer, high-risk uterine cancer, monitor. * Tracking Code: BO * EXAM: CT ABDOMEN AND PELVIS WITH CONTRAST TECHNIQUE: Multidetector CT imaging of the abdomen and pelvis was performed using the standard protocol following bolus administration of intravenous contrast. RADIATION DOSE REDUCTION: This exam was performed according to the departmental dose-optimization program which includes automated exposure control, adjustment of the mA and/or kV according to patient size and/or use of iterative reconstruction technique. CONTRAST:  OMNIPAQUE  IOHEXOL  300 MG/ML  SOLN COMPARISON:  Multiple priors including most recent CT December 14, 2022 FINDINGS: Lower chest: No acute abnormality. Hepatobiliary: Stable 3.5 cm hemangioma in the left lobe of the liver. Hyperenhancing 11 mm focus in the posterior right lobe of the liver on image 12/2 this was previously hypodense on prior examination from September 13, 2022 but unchanged in size at 11 mm in favored to reflect a hemangioma. Stable 9 mm hypodensity along the inferior right lobe of the liver on image 28/2 favored to reflect a benign etiology such as hemangioma. No new suspicious hepatic lesions. Gallbladder is nondistended.  No biliary ductal dilation. Pancreas: No pancreatic ductal dilation or evidence of acute inflammation. Spleen: No splenomegaly. Adrenals/Urinary Tract: Bilateral adrenal glands appear normal. No hydronephrosis. Kidneys demonstrate symmetric enhancement. Urinary bladder is unremarkable for degree of distension. Stomach/Bowel: No radiopaque contrast material was administered. Stomach is unremarkable for degree of distension. No pathologic dilation of small or large bowel. Normal appendix. No evidence of  acute bowel inflammation. Vascular/Lymphatic: Normal caliber abdominal aorta. Smooth IVC contours. The portal, splenic and superior mesenteric veins are patent. Similar slightly expansile filling defect in the right gonadal vein on image 44/2. No pathologically enlarged abdominal or pelvic lymph nodes. Reproductive: Uterus is surgically absent without suspicious nodularity along the vaginal cuff. No suspicious adnexal mass. Other: No significant abdominopelvic free fluid. Postsurgical change in the abdominal wall. Musculoskeletal: No aggressive lytic or blastic lesion of bone. IMPRESSION: 1. Status post hysterectomy without evidence of recurrent or metastatic disease in the abdomen or pelvis. 2. Stable hepatic lesions favored hemangiomas. No new suspicious hepatic lesions. 3. Stable hypodense expansile appearance of the mid gonadal vein possibly reflecting thrombus. Electronically Signed   By: Reyes Holder M.D.   On: 12/09/2023 09:58        PHYSICAL EXAMINATION: ECOG PERFORMANCE STATUS: 0 - Asymptomatic  Vitals:   12/12/23 1112  BP: 132/87  Pulse: 89  Resp: 18  SpO2: 100%   Filed Weights   12/12/23 1112  Weight: 245 lb (111.1 kg)    GENERAL:alert, no distress and comfortable   LABORATORY DATA:  I have reviewed the data as listed  Component Value Date/Time   NA 141 12/05/2023 1211   NA 139 09/22/2021 0954   K 4.1 12/05/2023 1211   CL 107 12/05/2023 1211   CO2 27 12/05/2023 1211   GLUCOSE 102 (H) 12/05/2023 1211   BUN 13 12/05/2023 1211   BUN 13 09/22/2021 0954   CREATININE 0.69 12/05/2023 1211   CREATININE 0.89 06/29/2023 1204   CREATININE 0.67 04/23/2015 1645   CALCIUM  9.1 12/05/2023 1211   PROT 7.1 12/05/2023 1211   PROT 7.3 09/22/2021 0954   ALBUMIN 4.1 12/05/2023 1211   ALBUMIN 4.4 09/22/2021 0954   AST 12 (L) 12/05/2023 1211   AST 16 06/29/2023 1204   ALT 11 12/05/2023 1211   ALT 14 06/29/2023 1204   ALKPHOS 86 12/05/2023 1211   BILITOT 0.4 12/05/2023 1211    BILITOT 0.5 06/29/2023 1204   GFRNONAA >60 12/05/2023 1211   GFRNONAA >60 06/29/2023 1204   GFRNONAA >89 01/06/2014 1643   GFRAA >60 02/25/2020 0334   GFRAA >89 01/06/2014 1643    No results found for: SPEP, UPEP  Lab Results  Component Value Date   WBC 4.9 12/05/2023   NEUTROABS 2.4 12/05/2023   HGB 11.0 (L) 12/05/2023   HCT 32.8 (L) 12/05/2023   MCV 96.5 12/05/2023   PLT 189 12/05/2023      Chemistry      Component Value Date/Time   NA 141 12/05/2023 1211   NA 139 09/22/2021 0954   K 4.1 12/05/2023 1211   CL 107 12/05/2023 1211   CO2 27 12/05/2023 1211   BUN 13 12/05/2023 1211   BUN 13 09/22/2021 0954   CREATININE 0.69 12/05/2023 1211   CREATININE 0.89 06/29/2023 1204   CREATININE 0.67 04/23/2015 1645      Component Value Date/Time   CALCIUM  9.1 12/05/2023 1211   ALKPHOS 86 12/05/2023 1211   AST 12 (L) 12/05/2023 1211   AST 16 06/29/2023 1204   ALT 11 12/05/2023 1211   ALT 14 06/29/2023 1204   BILITOT 0.4 12/05/2023 1211   BILITOT 0.5 06/29/2023 1204       RADIOGRAPHIC STUDIES: I have personally reviewed the radiological images as listed and agreed with the findings in the report. CT ABDOMEN PELVIS W CONTRAST Result Date: 12/09/2023 CLINICAL DATA:  History of endometrial cancer, high-risk uterine cancer, monitor. * Tracking Code: BO * EXAM: CT ABDOMEN AND PELVIS WITH CONTRAST TECHNIQUE: Multidetector CT imaging of the abdomen and pelvis was performed using the standard protocol following bolus administration of intravenous contrast. RADIATION DOSE REDUCTION: This exam was performed according to the departmental dose-optimization program which includes automated exposure control, adjustment of the mA and/or kV according to patient size and/or use of iterative reconstruction technique. CONTRAST:  OMNIPAQUE  IOHEXOL  300 MG/ML  SOLN COMPARISON:  Multiple priors including most recent CT December 14, 2022 FINDINGS: Lower chest: No acute abnormality.  Hepatobiliary: Stable 3.5 cm hemangioma in the left lobe of the liver. Hyperenhancing 11 mm focus in the posterior right lobe of the liver on image 12/2 this was previously hypodense on prior examination from September 13, 2022 but unchanged in size at 11 mm in favored to reflect a hemangioma. Stable 9 mm hypodensity along the inferior right lobe of the liver on image 28/2 favored to reflect a benign etiology such as hemangioma. No new suspicious hepatic lesions. Gallbladder is nondistended.  No biliary ductal dilation. Pancreas: No pancreatic ductal dilation or evidence of acute inflammation. Spleen: No splenomegaly. Adrenals/Urinary Tract: Bilateral adrenal glands appear normal. No  hydronephrosis. Kidneys demonstrate symmetric enhancement. Urinary bladder is unremarkable for degree of distension. Stomach/Bowel: No radiopaque contrast material was administered. Stomach is unremarkable for degree of distension. No pathologic dilation of small or large bowel. Normal appendix. No evidence of acute bowel inflammation. Vascular/Lymphatic: Normal caliber abdominal aorta. Smooth IVC contours. The portal, splenic and superior mesenteric veins are patent. Similar slightly expansile filling defect in the right gonadal vein on image 44/2. No pathologically enlarged abdominal or pelvic lymph nodes. Reproductive: Uterus is surgically absent without suspicious nodularity along the vaginal cuff. No suspicious adnexal mass. Other: No significant abdominopelvic free fluid. Postsurgical change in the abdominal wall. Musculoskeletal: No aggressive lytic or blastic lesion of bone. IMPRESSION: 1. Status post hysterectomy without evidence of recurrent or metastatic disease in the abdomen or pelvis. 2. Stable hepatic lesions favored hemangiomas. No new suspicious hepatic lesions. 3. Stable hypodense expansile appearance of the mid gonadal vein possibly reflecting thrombus. Electronically Signed   By: Reyes Holder M.D.   On: 12/09/2023  09:58

## 2023-12-12 NOTE — Assessment & Plan Note (Signed)
 This is multifactorial, due to history of B12 deficiency as well as anemia chronic illness She is not symptomatic Observe only Overall, it is improving

## 2023-12-12 NOTE — Assessment & Plan Note (Addendum)
 She had received many doses of vitamin B12 injection Repeat labs from a week ago showed adequate replacement She will continue monthly B12 injections We will teach the patient how to give B12 injection so she can self administer at home

## 2024-01-08 ENCOUNTER — Encounter: Payer: Self-pay | Admitting: Podiatry

## 2024-01-08 ENCOUNTER — Ambulatory Visit (INDEPENDENT_AMBULATORY_CARE_PROVIDER_SITE_OTHER): Payer: Medicaid Other | Admitting: Podiatry

## 2024-01-08 DIAGNOSIS — Z87898 Personal history of other specified conditions: Secondary | ICD-10-CM | POA: Diagnosis not present

## 2024-01-08 DIAGNOSIS — L84 Corns and callosities: Secondary | ICD-10-CM | POA: Diagnosis not present

## 2024-01-08 DIAGNOSIS — E1140 Type 2 diabetes mellitus with diabetic neuropathy, unspecified: Secondary | ICD-10-CM | POA: Diagnosis not present

## 2024-01-08 NOTE — Progress Notes (Signed)
 Subjective:  [name], female    DOB: 08-26-1961,  MRN: [id number]  No chief complaint on file.   DOS: 10/02/23 Procedure: Right great and right fifth toe irrigation and debridement with bone biopsy. With Dr. Rosemarie Conquest   63 y.o. female returns for POV#5. Relates doing well. No issues    Review of Systems: Negative except as noted in the HPI. Denies N/V/F/Ch.  Past Medical History:  Diagnosis Date   Anemia    Asthma 11/01/2010   only when respiratory infections   BPPV (benign paroxysmal positional vertigo) 03/07/2013   Broken teeth 11/02/2016   Callus of toe 07/07/2022   Colon cancer screening 05/05/2020   Diabetes mellitus    since 1983   Dizziness 09/22/2021   Elevated TSH 05/13/2013   Endometrial cancer (HCC)    Gangrene of toe of right foot (HCC) 04/14/2021   GERD (gastroesophageal reflux disease)    Hand pain, right 03/14/2019   History of radiation therapy    Vagina- 06/13/22-07/11/22-Dr. 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/2010   Qualifier: Diagnosis of  By: Gaylyn Keas FNP, [place]     Osteomyelitis [facility])    Pneumonia    as a child   Wrist pain 04/11/2013    Current Outpatient Medications:    Accu-Chek Softclix Lancets lancets, CHECK BLOOD SUGAR 4 TIMES A DAY BEFORE MEALS AND BEDTIME, Disp: 204 each, Rfl: 10   acetaminophen  (TYLENOL ) 650 MG CR tablet, Take 1,300-1,950 mg by mouth every 8 (eight) hours as needed for pain., Disp: , Rfl:    atorvastatin  (LIPITOR ) 80 MG tablet, Take 1 tablet (80 mg total) by mouth daily., Disp: 90 tablet, Rfl: 3   cyanocobalamin  (VITAMIN B12) 1000 MCG/ML injection, Inject 1 mL (1,000 mcg total) into the muscle every 30 (thirty) days., Disp: 1 mL, Rfl: 11   glucose blood (ACCU-CHEK GUIDE) test strip, Check blood sugar 4 times per day, Disp: 125 each, Rfl: 12   insulin  lispro (HUMALOG ) 100 UNIT/ML injection,  Inject 0.07 mLs (7 Units total) into the skin 3 (three) times daily with meals., Disp: 10 mL, Rfl: 3   Insulin  Syringe-Needle U-100 31G X 15/64" 0.5 ML MISC, 1 Syringe by Does not apply route 3 (three) times daily., Disp: 50 each, Rfl: 3   meclizine  (ANTIVERT ) 25 MG tablet, Take 1 tablet (25 mg total) by mouth 2 (two) times daily as needed for dizziness., Disp: 60 tablet, Rfl: 3   metFORMIN  (GLUCOPHAGE -XR) 500 MG 24 hr tablet, Take 4 tablets (2,000 mg total) by mouth at bedtime., Disp: 360 tablet, Rfl: 3   pregabalin  (LYRICA ) 75 MG capsule, Take 1 capsule (75 mg total) by mouth 2 (two) times daily. (Patient taking differently: Take 150 mg by mouth at bedtime.), Disp: 60 capsule, Rfl: 3   Semaglutide , 2 MG/DOSE, (OZEMPIC , 2 MG/DOSE,) 8 MG/3ML SOPN, Inject 2 mg into the skin once a week., Disp: 3 mL, Rfl: 3   SEMGLEE , YFGN, 100 UNIT/ML Pen, Inject 30 Units into the skin every evening., Disp: , Rfl:    traMADol  (ULTRAM ) 50 MG tablet, Take 50 mg by mouth every 6 (six) hours as needed for severe pain (pain score 7-10)., Disp: , Rfl:    zolpidem  (AMBIEN ) 10 MG tablet, Take 10 mg by mouth at bedtime as needed for sleep., Disp: , Rfl:   Social History   Tobacco Use  Smoking Status Former   Current packs/day: 0.00   Types: Cigarettes   Quit date: 11/28/1962   Years since quitting: 61.1  Smokeless Tobacco Never    Allergies  Allergen Reactions   Nsaids Nausea And Vomiting    Acid reflux   Penicillins Other (See Comments)    Did it involve swelling of the face/tongue/throat, SOB, or low BP? Unk Did it involve sudden or severe rash/hives, skin peeling, or any reaction on the inside of your mouth or nose? Unk  Did you need to seek medical attention at a hospital or doctor's office? Unk When did it last happen? Childhood       Has tolerated cephalosporins on several occasions (2023-2024)   Objective:  There were no vitals filed for this visit. There is no height or weight on file to calculate  BMI. Constitutional Well developed. Well nourished.  Vascular Foot warm and well perfused. Capillary refill normal to all digits.   Neurologic Normal speech. Oriented to person, place, and time. Epicritic sensation to light touch grossly present bilaterally.  Dermatologic Skin healing well without signs of infection on fifth digit . Skin edges well coapted without signs of infection. Right hallux wound healed. No erythema or edema noted.  No probe to bone. No purulence noted.  Hyperkeratotic lesions noted sub second metatarsal and fifth metatarsal bilateral nearly pre-ulcerative underlying second metatarsals head.   Orthopedic: Tenderness to palpation noted about the surgical site.   Radiographs: No major interval changes in X-rays from OR.   Component 7 d ago  Specimen Description WOUND RIGHT TOE  Special Requests RT GREAT TOE HALLUX SWAB SAMPLE C  Gram Stain RARE WBC PRESENT, PREDOMINANTLY PMN RARE GRAM POSITIVE COCCI RARE GRAM NEGATIVE RODS  Culture MODERATE ENTEROBACTER CLOACAE MODERATE GROUP B STREP(S.AGALACTIAE)ISOLATED TESTING AGAINST S. AGALACTIAE NOT ROUTINELY PERFORMED DUE TO PREDICTABILITY OF AMP/PEN/VAN SUSCEPTIBILITY. NO ANAEROBES ISOLATED Performed at [facility], [address], [place], [place] [address]  Report Status 10/07/2023 FINAL  Organism ID, Bacteria ENTEROBACTER CLOACAE   Component 7 d ago  Specimen Description BONE BIOPSY  Special Requests 5TH TOE  Gram Stain NO WBC SEEN NO ORGANISMS SEEN  Culture RARE GROUP B STREP(S.AGALACTIAE)ISOLATED TESTING AGAINST S. AGALACTIAE NOT ROUTINELY PERFORMED DUE TO PREDICTABILITY OF AMP/PEN/VAN SUSCEPTIBILITY. NO ANAEROBES ISOLATED Performed at [facility], [address], [place], [place] [address]  Report Status 10/08/2023 FINAL      Component 7 d ago  Specimen Description BIOPSY BONE RIGHT TOE  Special Requests RT GREAT TOE SAMPLE A  Gram Stain NO WBC SEEN NO ORGANISMS SEEN  Culture MODERATE  ENTEROBACTER CLOACAE FEW GROUP B STREP(S.AGALACTIAE)ISOLATED TESTING AGAINST S. AGALACTIAE NOT ROUTINELY PERFORMED DUE TO PREDICTABILITY OF AMP/PEN/VAN SUSCEPTIBILITY. NO ANAEROBES ISOLATED Performed at [facility], [address] [place], [place] [address]  Report Status 10/07/2023 FINAL  Resulting Agency [facility]           Assessment:   1. Pre-ulcerative calluses   2. Controlled type 2 diabetes with neuropathy (HCC)   3. History of ulceration      Plan:  Patient was evaluated and treated and all questions answered.  S/p foot surgery right -Progressing as expected post-operatively. No worsening infection noted  -Wound healed.  -WB Status: WBAT in regular shoes  -Medications: Has finished antibiotics.   -Hyperkeratotic lesions sub second metatarsal and fifth metatarsals bilateral debrided with chisel without incident as courtesy today. Advised to continued to keep an eye on these areas. Advised  to continue soaks and filing dead skin down until seen again.   She is discharged from a surgical standpoint. Will have her follow-up in 10 weeks for rfc or pre-ulcerative calluses    Return in about 10 weeks (around 03/18/2024) for rfc.

## 2024-01-10 ENCOUNTER — Encounter: Payer: Self-pay | Admitting: Hematology and Oncology

## 2024-01-11 ENCOUNTER — Telehealth: Payer: Self-pay

## 2024-01-11 NOTE — Telephone Encounter (Signed)
[name],  Per chart review, patient has poor venous access.  She has a port-a-cath.  Is she still appropriate for [facility]?  Please advise. Thank you Jerita Wimbush

## 2024-01-12 ENCOUNTER — Ambulatory Visit: Payer: Medicaid Other

## 2024-01-12 VITALS — Ht 73.0 in | Wt 242.0 lb

## 2024-01-12 DIAGNOSIS — Z1211 Encounter for screening for malignant neoplasm of colon: Secondary | ICD-10-CM

## 2024-01-12 MED ORDER — SUFLAVE 178.7 G PO SOLR: 1.0000 | ORAL | 0 refills | Status: DC

## 2024-01-12 NOTE — Progress Notes (Signed)
No egg or soy allergy known to patient  No issues known to pt with past sedation with any surgeries or procedures Patient denies ever being told they had issues or difficulty with intubation  No FH of Malignant Hyperthermia Pt is not on diet pills Pt is not on  home 02  Pt is not on blood thinners  Pt denies issues with constipation  No A fib or A flutter Have any cardiac testing pending-- no LOA: independent  Prep: suflave  Patient's chart reviewed by Cathlyn Parsons CNRA prior to previsit and patient appropriate for the [facility].  Previsit completed and red dot placed by patient's name on their procedure day (on provider's schedule).     PV completed with patient. Prep instructions sent via mychart and home address.

## 2024-02-01 ENCOUNTER — Telehealth: Payer: Self-pay | Admitting: Pediatrics

## 2024-02-01 ENCOUNTER — Other Ambulatory Visit: Payer: Self-pay | Admitting: *Deleted

## 2024-02-01 DIAGNOSIS — Z1211 Encounter for screening for malignant neoplasm of colon: Secondary | ICD-10-CM

## 2024-02-01 MED ORDER — SUFLAVE 178.7 G PO SOLR
1.0000 | Freq: Once | ORAL | 0 refills | Status: DC
Start: 2024-02-01 — End: 2024-02-01

## 2024-02-01 MED ORDER — NA SULFATE-K SULFATE-MG SULF 17.5-3.13-1.6 GM/177ML PO SOLN: 1.0000 | Freq: Once | ORAL | 0 refills | Status: AC

## 2024-02-01 NOTE — Telephone Encounter (Signed)
 Pt informed that a RX of Suprep has been sent to her pharmacy and will be available later today to pick up. Sh was also informed that a new set of instructions have been sent to her My Chart account. Pt states she knows how to get onto My Chart

## 2024-02-01 NOTE — Telephone Encounter (Signed)
 Inbound call from patient stating she spoke with [facility] and they advised that they have not received an order for prep medication to send to patient. Patient is requesting a call to discuss how to receive prep medication for 3/12 colonoscopy. Please advise, thank you.

## 2024-02-01 NOTE — Telephone Encounter (Signed)
 LM with pt that RX was resent to [facility]. #  for call back given.

## 2024-02-01 NOTE — Telephone Encounter (Signed)
 Prep rx for suprep sent to [facility] new prep instructions sent to patients mychart

## 2024-02-06 ENCOUNTER — Inpatient Hospital Stay: Payer: Medicaid Other | Attending: Gynecologic Oncology

## 2024-02-06 ENCOUNTER — Other Ambulatory Visit: Payer: Self-pay | Admitting: Hematology and Oncology

## 2024-02-06 DIAGNOSIS — C541 Malignant neoplasm of endometrium: Secondary | ICD-10-CM | POA: Insufficient documentation

## 2024-02-06 DIAGNOSIS — Z452 Encounter for adjustment and management of vascular access device: Secondary | ICD-10-CM | POA: Insufficient documentation

## 2024-02-06 MED ORDER — CYANOCOBALAMIN 1000 MCG/ML IJ SOLN
1000.0000 ug | Freq: Once | INTRAMUSCULAR | Status: DC
  Filled 2024-02-06: qty 1

## 2024-02-06 MED ORDER — HEPARIN SOD (PORK) LOCK FLUSH 100 UNIT/ML IV SOLN
500.0000 [IU] | Freq: Once | INTRAVENOUS | Status: AC
  Administered 2024-02-06: 500 [IU]

## 2024-02-06 MED ORDER — SODIUM CHLORIDE 0.9% FLUSH
10.0000 mL | Freq: Once | INTRAVENOUS | Status: AC
  Administered 2024-02-06: 10 mL

## 2024-02-06 NOTE — Progress Notes (Signed)
 Patient here for port flush.  States she administered her Vit. B 12 injection to her left thigh earlier this month.  Express concern that after the injection she had a lot of bleeding.  She prefers to have the injection administered here instead.  Patient states she has one more vial of Vit. B 12 at home.  Advised her to bring it during her next B 12 appointment. Secure chatted Dr. Bertis Ruddy and Brenda/RN to inform them of the above.  Patient's request to have the appointment sent to My chart.  She was made aware to check for the appointment and to bring the injection at her next appointment

## 2024-02-07 ENCOUNTER — Encounter: Payer: Medicaid Other | Admitting: Pediatrics

## 2024-03-02 ENCOUNTER — Emergency Department (HOSPITAL_COMMUNITY)
Admission: EM | Admit: 2024-03-02 | Discharge: 2024-03-02 | Disposition: A | Discharge status: Home / Self Care | Attending: Emergency Medicine | Admitting: Emergency Medicine

## 2024-03-02 ENCOUNTER — Encounter (HOSPITAL_COMMUNITY): Payer: Self-pay

## 2024-03-02 ENCOUNTER — Other Ambulatory Visit: Payer: Self-pay

## 2024-03-02 DIAGNOSIS — K5641 Fecal impaction: Secondary | ICD-10-CM | POA: Insufficient documentation

## 2024-03-02 DIAGNOSIS — Z8542 Personal history of malignant neoplasm of other parts of uterus: Secondary | ICD-10-CM | POA: Insufficient documentation

## 2024-03-02 DIAGNOSIS — K6289 Other specified diseases of anus and rectum: Secondary | ICD-10-CM | POA: Diagnosis present

## 2024-03-02 DIAGNOSIS — Z794 Long term (current) use of insulin: Secondary | ICD-10-CM | POA: Diagnosis not present

## 2024-03-02 NOTE — Discharge Instructions (Addendum)
 You had a fecal impaction, we manually disimpacted you and used an enema.  I recommend increasing your water intake, and using MiraLAX as needed, for constipation.  Make sure you are not straining with bowel movements, and completely emptying, after having a bowel movement.  Do not hold in your stool.  Return to the ER if you have severe abdominal pain, chest pain, or shortness of breath

## 2024-03-02 NOTE — ED Triage Notes (Signed)
 BIBA from home -out of Metformin for a week, is now constipated x 2 days. Hx of Endometrial CA 140/90 BP 100 HR 144 cbg 97.5 temp

## 2024-03-02 NOTE — ED Provider Notes (Signed)
 [place] EMERGENCY DEPARTMENT AT [facility] Provider Note   CSN: [id number] Arrival date & time: 03/02/24  1244     History  Chief Complaint  Patient presents with   Constipation    [name] is a 63 y.o. female, history of endometrial cancer, who presents to the ED secondary to severe rectal pain, it has been going on for the last 2 days.  She has not been able to have a bowel movement, feels the urge to, and took a suppository this morning, but could not get anything out.  She notes that she has severe pressure, and she has had to be disimpacted before, she believes that is what is going on.  She denies any severe abdominal pain, states is mostly rectal.  Denies any fevers or chills.    Home Medications Prior to Admission medications   Medication Sig Start Date End Date Taking? Authorizing Provider  Accu-Chek Softclix Lancets lancets CHECK BLOOD SUGAR 4 TIMES A DAY BEFORE MEALS AND BEDTIME 09/03/22   Long, Arlyss Repress, MD  acetaminophen (TYLENOL) 650 MG CR tablet Take 1,300-1,950 mg by mouth every 8 (eight) hours as needed for pain.    [provider]  atorvastatin (LIPITOR) 80 MG tablet Take 1 tablet (80 mg total) by mouth daily. 09/23/21   Steffanie Rainwater, MD  cyanocobalamin (VITAMIN B12) 1000 MCG/ML injection Inject 1 mL (1,000 mcg total) into the muscle every 30 (thirty) days. 12/12/23   Artis Delay, MD  glucose blood (ACCU-CHEK GUIDE) test strip Check blood sugar 4 times per day 08/31/22   Multani, Bhupinder, MD  insulin lispro (HUMALOG) 100 UNIT/ML injection Inject 0.07 mLs (7 Units total) into the skin 3 (three) times daily with meals. 12/26/22   Mercie Eon, MD  Insulin Syringe-Needle U-100 31G X 15/64" 0.5 ML MISC 1 Syringe by Does not apply route 3 (three) times daily. 08/26/22   Masters, Katie, DO  ketoconazole (NIZORAL) 2 % cream Apply 1 Application topically 2 (two) times daily as needed.    [provider]  levalbuterol Pauline Aus HFA) 45  MCG/ACT inhaler Inhale 2 puffs into the lungs every 4 (four) hours as needed. Patient not taking: Reported on 01/12/2024 12/04/23   [provider]  meclizine (ANTIVERT) 25 MG tablet Take 1 tablet (25 mg total) by mouth 2 (two) times daily as needed for dizziness. Patient not taking: Reported on 01/12/2024 12/26/22   Mercie Eon, MD  metFORMIN (GLUCOPHAGE-XR) 500 MG 24 hr tablet Take 4 tablets (2,000 mg total) by mouth at bedtime. 12/26/22 01/12/24  Mercie Eon, MD  pregabalin (LYRICA) 75 MG capsule Take 1 capsule (75 mg total) by mouth 2 (two) times daily. Patient taking differently: Take 150 mg by mouth at bedtime. 10/03/22 01/12/24  Masters, Katie, DO  Semaglutide, 2 MG/DOSE, (OZEMPIC, 2 MG/DOSE,) 8 MG/3ML SOPN Inject 2 mg into the skin once a week. 12/23/22   Adron Bene, MD  SEMGLEE, YFGN, 100 UNIT/ML Pen Inject 30 Units into the skin every evening.    [provider]  traMADol (ULTRAM) 50 MG tablet Take 50 mg by mouth every 6 (six) hours as needed for severe pain (pain score 7-10). 09/26/23 09/25/24  [provider]  zolpidem (AMBIEN) 10 MG tablet Take 10 mg by mouth at bedtime as needed for sleep. 09/11/23   [provider]      Allergies    Nsaids and Penicillins    Review of Systems   Review of Systems  Gastrointestinal:  Positive for constipation.    Physical Exam Updated Vital Signs BP 109/76   Pulse 89   Temp 97.8 F (36.6 C) (Oral)   Resp 20   LMP 06/23/2014   SpO2 100%  Physical Exam Vitals and nursing note reviewed.  Constitutional:      General: She is not in acute distress.    Appearance: She is well-developed.  HENT:     Head: Normocephalic and atraumatic.  Eyes:     Conjunctiva/sclera: Conjunctivae normal.  Cardiovascular:     Rate and Rhythm: Normal rate and regular rhythm.     Heart sounds: No murmur heard. Pulmonary:     Effort: Pulmonary effort is normal. No respiratory distress.     Breath sounds: Normal breath  sounds.  Abdominal:     Palpations: Abdomen is soft.     Tenderness: There is no abdominal tenderness.  Genitourinary:    Comments: Chaperone present, +fecal impaction Musculoskeletal:        General: No swelling.     Cervical back: Neck supple.  Skin:    General: Skin is warm and dry.     Capillary Refill: Capillary refill takes less than 2 seconds.  Neurological:     Mental Status: She is alert.  Psychiatric:        Mood and Affect: Mood normal.     ED Results / Procedures / Treatments   Labs (all labs ordered are listed, but only abnormal results are displayed) Labs Reviewed - No data to display  EKG None  Radiology No results found.  Procedures .Fecal disimpaction  Date/Time: 03/02/2024 1:48 PM  Performed by: Pete Pelt, PA Authorized by: Pete Pelt, PA  Consent: Verbal consent obtained. Risks and benefits: risks, benefits and alternatives were discussed Consent given by: patient Patient understanding: patient states understanding of the procedure being performed Patient consent: the patient's understanding of the procedure matches consent given Patient identity confirmed: verbally with patient Patient tolerance: patient tolerated the procedure well with no immediate complications       Medications Ordered in ED Medications - No data to display  ED Course/ Medical Decision Making/ A&P                                 Medical Decision Making Patient 63 year old female, history of endometrial cancer, here for fecal impaction.  She has a large amount of this stool in her rectum.  I disimpacted her, and she had some relief, still saw a large amount of stool, unable to tolerate disimpaction further, enema provided, with good bowel movement.  Able to pass gas, and feels much better, discharged home with strict return precautions.   Final Clinical Impression(s) / ED Diagnoses Final diagnoses:  Fecal impaction in rectum [facility])    Rx / DC Orders ED  Discharge Orders     None         Loghan Kurtzman, Harley Alto, PA 03/02/24 1500    Glyn Ade, MD 03/02/24 2306

## 2024-03-04 ENCOUNTER — Ambulatory Visit (INDEPENDENT_AMBULATORY_CARE_PROVIDER_SITE_OTHER): Payer: Medicaid Other | Admitting: Podiatry

## 2024-03-04 DIAGNOSIS — Z91199 Patient's noncompliance with other medical treatment and regimen due to unspecified reason: Secondary | ICD-10-CM

## 2024-03-04 NOTE — Progress Notes (Signed)
 No show

## 2024-03-13 NOTE — Progress Notes (Deleted)
 [name] did not present for her colonoscopy.  She was contacted by the endoscopy procedure staff.  She informed the staff that she was recently in the emergency department for fecal impaction and had forgotten about her procedure.  Advised that she should reschedule her colonoscopy with a 2-day bowel prep given recent issues with fecal impaction.

## 2024-03-14 ENCOUNTER — Telehealth: Payer: Self-pay | Admitting: *Deleted

## 2024-03-14 ENCOUNTER — Encounter: Admitting: Pediatrics

## 2024-03-14 ENCOUNTER — Telehealth: Payer: Self-pay | Admitting: Pediatrics

## 2024-03-14 DIAGNOSIS — Z1211 Encounter for screening for malignant neoplasm of colon: Secondary | ICD-10-CM

## 2024-03-14 NOTE — Telephone Encounter (Signed)
 Hi Dr Yvone Herd,   I called patient regarding her procedure she stated she had fecal impaction about a week ago and had forgotten about her procedure today. I will no show her for today's appointment.   Thank you

## 2024-03-14 NOTE — Telephone Encounter (Signed)
 Reached out to pt per MD to reschedule for colonoscopy. Pt was a no show for procedure today d/t feeling traumatized after recent fecal impaction that required treatment at an ED. Per Dr. Yvone Herd pt needs 2 day prep. Rescheduled for colonoscopy and pre-visit as it her last pre-visit was in February.

## 2024-03-19 ENCOUNTER — Other Ambulatory Visit: Payer: Self-pay | Admitting: Hematology and Oncology

## 2024-03-19 DIAGNOSIS — C541 Malignant neoplasm of endometrium: Secondary | ICD-10-CM

## 2024-03-19 NOTE — Progress Notes (Signed)
 err

## 2024-03-22 ENCOUNTER — Telehealth: Payer: Self-pay

## 2024-03-22 NOTE — Telephone Encounter (Signed)
 Called and given radiology scheduling # to call to schedule CT on 7/1 after lab appt. She will call to schedule.

## 2024-04-02 ENCOUNTER — Inpatient Hospital Stay

## 2024-04-02 ENCOUNTER — Inpatient Hospital Stay: Payer: Medicaid Other | Attending: Gynecologic Oncology

## 2024-04-02 VITALS — BP 133/70 | HR 87 | Resp 18

## 2024-04-02 DIAGNOSIS — E538 Deficiency of other specified B group vitamins: Secondary | ICD-10-CM | POA: Diagnosis not present

## 2024-04-02 DIAGNOSIS — Z452 Encounter for adjustment and management of vascular access device: Secondary | ICD-10-CM | POA: Insufficient documentation

## 2024-04-02 DIAGNOSIS — C541 Malignant neoplasm of endometrium: Secondary | ICD-10-CM | POA: Diagnosis present

## 2024-04-02 MED ORDER — CYANOCOBALAMIN 1000 MCG/ML IJ SOLN
1000.0000 ug | Freq: Once | INTRAMUSCULAR | Status: AC
  Administered 2024-04-02: 1000 ug via INTRAMUSCULAR
  Filled 2024-04-02: qty 1

## 2024-04-02 MED ORDER — SODIUM CHLORIDE 0.9% FLUSH
10.0000 mL | Freq: Once | INTRAVENOUS | Status: AC
  Administered 2024-04-02: 10 mL

## 2024-04-02 MED ORDER — HEPARIN SOD (PORK) LOCK FLUSH 100 UNIT/ML IV SOLN
500.0000 [IU] | Freq: Once | INTRAVENOUS | Status: AC
  Administered 2024-04-02: 500 [IU]

## 2024-04-02 NOTE — Patient Instructions (Signed)
 Vitamin B12 Deficiency Vitamin B12 deficiency means that your body does not have enough vitamin B12. The body needs this important vitamin: To make red blood cells. To make genes (DNA). To help the nerves work. If you do not have enough vitamin B12 in your body, you can have health problems, such as not having enough red blood cells in the blood (anemia). What are the causes? Not eating enough foods that contain vitamin B12. Not being able to take in (absorb) vitamin B12 from the food that you eat. Certain diseases. A condition in which the body does not make enough of a certain protein. This results in your body not taking in enough vitamin B12. Having a surgery in which part of the stomach or small intestine is taken out. Taking medicines that make it hard for the body to take in vitamin B12. These include: Heartburn medicines. Some medicines that are used to treat diabetes. What increases the risk? Being an older adult. Eating a vegetarian or vegan diet that does not include any foods that come from animals. Not eating enough foods that contain vitamin B12 while you are pregnant. Taking certain medicines. Having alcoholism. What are the signs or symptoms? In some cases, there are no symptoms. If the condition leads to too few blood cells or nerve damage, symptoms can occur, such as: Feeling weak or tired. Not being hungry. Losing feeling (numbness) or tingling in your hands and feet. Redness and burning of the tongue. Feeling sad (depressed). Confusion or memory problems. Trouble walking. If anemia is very bad, symptoms can include: Being short of breath. Being dizzy. Having a very fast heartbeat. How is this treated? Changing the way you eat and drink, such as: Eating more foods that contain vitamin B12. Drinking little or no alcohol. Getting vitamin B12 shots. Taking vitamin B12 supplements by mouth (orally). Your doctor will tell you the dose that is best for you. Follow  these instructions at home: Eating and drinking  Eat foods that come from animals and have a lot of vitamin B12 in them. These include: Meats and poultry. This includes beef, pork, chicken, [place], and organ meats, such as liver. Seafood, such as clams, rainbow trout, salmon, tuna, and haddock. Eggs. Dairy foods such as milk, yogurt, and cheese. Eat breakfast cereals that have vitamin B12 added to them (are fortified). Check the label. The items listed above may not be a complete list of foods and beverages you can eat and drink. Contact a dietitian for more information. Alcohol use Do not drink alcohol if: Your doctor tells you not to drink. You are pregnant, may be pregnant, or are planning to become pregnant. If you drink alcohol: Limit how much you have to: 0-1 drink a day for women. 0-2 drinks a day for men. Know how much alcohol is in your drink. In the [place] one drink equals one 12 oz bottle of beer (355 mL), one 5 oz glass of wine (148 mL), or one 1 oz glass of hard liquor (44 mL). General instructions Get any vitamin B12 shots if told by your doctor. Take supplements only as told by your doctor. Follow the directions. Keep all follow-up visits. Contact a doctor if: Your symptoms come back. Your symptoms get worse or do not get better with treatment. Get help right away if: You have trouble breathing. You have a very fast heartbeat. You have chest pain. You get dizzy. You faint. These symptoms may be an emergency. Get help right away. Call 911.  Do not wait to see if the symptoms will go away. Do not drive yourself to the hospital. Summary Vitamin B12 deficiency means that your body is not getting enough of the vitamin. In some cases, there are no symptoms of this condition. Treatment may include making a change in the way you eat and drink, getting shots, or taking supplements. Eat foods that have vitamin B12 in them. This information is not intended to replace advice  given to you by your health care provider. Make sure you discuss any questions you have with your health care provider. Document Revised: 07/09/2021 Document Reviewed: 07/09/2021 Elsevier Patient Education  2024 [employer].

## 2024-04-08 ENCOUNTER — Encounter (HOSPITAL_COMMUNITY): Payer: Self-pay

## 2024-04-09 ENCOUNTER — Encounter: Payer: Self-pay | Admitting: Hematology and Oncology

## 2024-04-09 ENCOUNTER — Ambulatory Visit: Payer: Medicaid Other | Admitting: Hematology and Oncology

## 2024-04-09 NOTE — Progress Notes (Signed)
 Returned call to patient from voicemail left regarding grant balance. Provided grant balance and advised what would be needed for expense she would like to submit. She verbalized understanding.  She has my card for any additional financial questions or concerns.

## 2024-04-15 ENCOUNTER — Ambulatory Visit

## 2024-04-15 VITALS — Ht 73.0 in | Wt 247.0 lb

## 2024-04-15 DIAGNOSIS — Z1211 Encounter for screening for malignant neoplasm of colon: Secondary | ICD-10-CM

## 2024-04-15 MED ORDER — NA SULFATE-K SULFATE-MG SULF 17.5-3.13-1.6 GM/177ML PO SOLN: 1.0000 | Freq: Once | ORAL | 0 refills | Status: AC

## 2024-04-15 NOTE — Progress Notes (Signed)

## 2024-04-24 ENCOUNTER — Encounter: Payer: Self-pay | Admitting: Hematology and Oncology

## 2024-04-24 NOTE — Progress Notes (Signed)
 Returned call to patient from voicemail left regarding utilizing remaining grant balance for her rent. Advised I have not received needed documents from rent office. She verbalized understanding and will check on again tomorrow 04/25/24.  She has my contact name and number for any additional financial questions or concerns.

## 2024-05-03 ENCOUNTER — Encounter: Admitting: Pediatrics

## 2024-05-26 NOTE — Progress Notes (Unsigned)
 [facility] History and Physical   Primary Care Physician:  Juliane Che, PA   Reason for Procedure:  Colorectal cancer screening-incidental symptoms of constipation  Plan:    Screening colonoscopy     HPI: [name] is a 63 y.o. female undergoing screening colonoscopy for colorectal cancer screening.  This is the patient's first colonoscopy.  No family history of colon cancer or colon polyps.  There is notation that the patient was seen in the emergency department in April 2025 for constipation and required manual disimpaction.  CTAP 11/2023 did not show any significant GI pathology.   Past Medical History:  Diagnosis Date   Anemia    Asthma 11/01/2010   only when respiratory infections   BPPV (benign paroxysmal positional vertigo) 03/07/2013   Broken teeth 11/02/2016   Callus of toe 07/07/2022   Colon cancer screening 05/05/2020   Diabetes mellitus    since 1983   Dizziness 09/22/2021   Elevated TSH 05/13/2013   Endometrial cancer (HCC)    Gangrene of toe of right foot (HCC) 04/14/2021   GERD (gastroesophageal reflux disease)    Hand pain, right 03/14/2019   History of radiation therapy    Vagina- 06/13/22-07/11/22-Dr. 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   Qualifier: Diagnosis of  By: Gladis FNP, Nykedtra     Osteomyelitis [facility])    Pneumonia    as a child   Wrist pain 04/11/2013    Past Surgical History:  Procedure Laterality Date   ABDOMINAL HYSTERECTOMY     ACHILLES TENDON LENGTHENING Left 08/29/2014   Procedure: Left Achilles Lengthening;  Surgeon: Jerona Harden GAILS, MD;  Location: [facility] OR;  Service: Orthopedics;  Laterality: Left;   AMPUTATION Left 02/19/2013   Procedure: Amputation of Left Great Toe;  Surgeon: Lonni CINDERELLA Poli, MD;  Location: [facility] OR;  Service: Orthopedics;  Laterality: Left;   AMPUTATION Left 08/29/2014    Procedure: Left Foot 2nd and 1st Toe Amputation;  Surgeon: Jerona Harden GAILS, MD;  Location: [facility] OR;  Service: Orthopedics;  Laterality: Left;   CARDIAC CATHETERIZATION     15 years ago   CESAREAN SECTION     x 4   CYSTOSCOPY  03/30/2022   Procedure: CYSTOSCOPY;  Surgeon: Viktoria Comer SAUNDERS, MD;  Location: [facility] ORS;  Service: Gynecology;;   IR IMAGING GUIDED PORT INSERTION  04/19/2022   IRRIGATION AND DEBRIDEMENT FOOT Right 10/02/2023   Procedure: IRRIGATION AND DEBRIDEMENT FOOT, bone biopsy RIGHT GREAT TOE AND RIGHT 5TH TOE;  Surgeon: Malvin Marsa FALCON, DPM;  Location: [facility] OR;  Service: Orthopedics/Podiatry;  Laterality: Right;   ROBOTIC ASSISTED TOTAL HYSTERECTOMY WITH BILATERAL SALPINGO OOPHERECTOMY Bilateral 03/30/2022   Procedure: XI ROBOTIC ASSISTED TOTAL HYSTERECTOMY WITH BILATERAL SALPINGO-OOPHORECTOMY, MINI LAPAROTOMY;  Surgeon: Viktoria Comer SAUNDERS, MD;  Location: [facility] ORS;  Service: Gynecology;  Laterality: Bilateral;   SENTINEL NODE BIOPSY N/A 03/30/2022   Procedure: SENTINEL NODE BIOPSY;  Surgeon: Viktoria Comer SAUNDERS, MD;  Location: [facility] ORS;  Service: Gynecology;  Laterality: N/A;   TUBAL LIGATION     at one of c-sections    Prior to Admission medications   Medication Sig Start Date End Date Taking? Authorizing Provider  Accu-Chek Softclix Lancets lancets CHECK BLOOD SUGAR 4 TIMES A DAY BEFORE MEALS AND BEDTIME 09/03/22   Long, Fonda MATSU, MD  acetaminophen  (TYLENOL ) 650 MG CR tablet Take 1,300-1,950 mg by  mouth every 8 (eight) hours as needed for pain.    [provider]  atorvastatin  (LIPITOR ) 80 MG tablet Take 1 tablet (80 mg total) by mouth daily. 09/23/21   Lou Claretta HERO, MD  cyanocobalamin  (VITAMIN B12) 1000 MCG/ML injection Inject 1 mL (1,000 mcg total) into the muscle every 30 (thirty) days. 12/12/23   Lonn Hicks, MD  glucose blood (ACCU-CHEK GUIDE) test strip Check blood sugar 4 times per day 08/31/22   Grey Ohs, MD  insulin  lispro (HUMALOG ) 100 UNIT/ML  injection Inject 0.07 mLs (7 Units total) into the skin 3 (three) times daily with meals. 12/26/22   Lovie Clarity, MD  Insulin  Syringe-Needle U-100 31G X 15/64 0.5 ML MISC 1 Syringe by Does not apply route 3 (three) times daily. 08/26/22   Masters, Katie, DO  ketoconazole (NIZORAL) 2 % cream Apply 1 Application topically 2 (two) times daily as needed. Patient not taking: Reported on 04/15/2024    [provider]  levalbuterol [facility] HFA) 45 MCG/ACT inhaler Inhale 2 puffs into the lungs every 4 (four) hours as needed. 12/04/23   [provider]  meclizine  (ANTIVERT ) 25 MG tablet Take 1 tablet (25 mg total) by mouth 2 (two) times daily as needed for dizziness. 12/26/22   Lovie Clarity, MD  metFORMIN  (GLUCOPHAGE -XR) 500 MG 24 hr tablet Take 4 tablets (2,000 mg total) by mouth at bedtime. 12/26/22 01/12/24  Lovie Clarity, MD  pregabalin  (LYRICA ) 75 MG capsule Take 1 capsule (75 mg total) by mouth 2 (two) times daily. Patient taking differently: Take 150 mg by mouth at bedtime. 10/03/22 01/12/24  Masters, Katie, DO  pregabalin  (LYRICA ) 75 MG capsule Take 75 mg by mouth. 03/29/24   [provider]  Semaglutide , 2 MG/DOSE, (OZEMPIC , 2 MG/DOSE,) 8 MG/3ML SOPN Inject 2 mg into the skin once a week. 12/23/22   Gawaluck, Greylon, MD  SEMGLEE , YFGN, 100 UNIT/ML Pen Inject 30 Units into the skin every evening.    [provider]  traMADol  (ULTRAM ) 50 MG tablet Take 50 mg by mouth every 6 (six) hours as needed for severe pain (pain score 7-10). 09/26/23 09/25/24  [provider]  zolpidem  (AMBIEN ) 10 MG tablet Take 10 mg by mouth at bedtime as needed for sleep. 09/11/23   [provider]    Current Outpatient Medications  Medication Sig Dispense Refill   Accu-Chek Softclix Lancets lancets CHECK BLOOD SUGAR 4 TIMES A DAY BEFORE MEALS AND BEDTIME 204 each 10   acetaminophen  (TYLENOL ) 650 MG CR tablet Take 1,300-1,950 mg by mouth every 8 (eight) hours as needed for  pain.     atorvastatin  (LIPITOR ) 80 MG tablet Take 1 tablet (80 mg total) by mouth daily. 90 tablet 3   cyanocobalamin  (VITAMIN B12) 1000 MCG/ML injection Inject 1 mL (1,000 mcg total) into the muscle every 30 (thirty) days. 1 mL 11   glucose blood (ACCU-CHEK GUIDE) test strip Check blood sugar 4 times per day 125 each 12   insulin  lispro (HUMALOG ) 100 UNIT/ML injection Inject 0.07 mLs (7 Units total) into the skin 3 (three) times daily with meals. 10 mL 3   Insulin  Syringe-Needle U-100 31G X 15/64 0.5 ML MISC 1 Syringe by Does not apply route 3 (three) times daily. 50 each 3   ketoconazole (NIZORAL) 2 % cream Apply 1 Application topically 2 (two) times daily as needed. (Patient not taking: Reported on 04/15/2024)     levalbuterol (XOPENEX HFA) 45 MCG/ACT inhaler Inhale 2 puffs into the lungs every 4 (  four) hours as needed.     meclizine  (ANTIVERT ) 25 MG tablet Take 1 tablet (25 mg total) by mouth 2 (two) times daily as needed for dizziness. 60 tablet 3   metFORMIN  (GLUCOPHAGE -XR) 500 MG 24 hr tablet Take 4 tablets (2,000 mg total) by mouth at bedtime. 360 tablet 3   pregabalin  (LYRICA ) 75 MG capsule Take 1 capsule (75 mg total) by mouth 2 (two) times daily. (Patient taking differently: Take 150 mg by mouth at bedtime.) 60 capsule 3   pregabalin  (LYRICA ) 75 MG capsule Take 75 mg by mouth.     Semaglutide , 2 MG/DOSE, (OZEMPIC , 2 MG/DOSE,) 8 MG/3ML SOPN Inject 2 mg into the skin once a week. 3 mL 3   SEMGLEE , YFGN, 100 UNIT/ML Pen Inject 30 Units into the skin every evening.     traMADol  (ULTRAM ) 50 MG tablet Take 50 mg by mouth every 6 (six) hours as needed for severe pain (pain score 7-10).     zolpidem  (AMBIEN ) 10 MG tablet Take 10 mg by mouth at bedtime as needed for sleep.     No current facility-administered medications for this visit.    Allergies as of 05/27/2024 - Review Complete 04/15/2024  Allergen Reaction Noted   Nsaids Nausea And Vomiting 03/28/2022   Penicillins Other (See  Comments) 11/01/2010    Family History  Problem Relation Age of Onset   Hypertension Mother    Dementia Mother    Heart attack Father    Heart disease Father    Diabetes Father    Hypertension Father    Diabetes Daughter    Colon cancer Neg Hx    Breast cancer Neg Hx    Ovarian cancer Neg Hx    Endometrial cancer Neg Hx    Pancreatic cancer Neg Hx    Prostate cancer Neg Hx    Rectal cancer Neg Hx    Stomach cancer Neg Hx    Colon polyps Neg Hx    Esophageal cancer Neg Hx     Social History   Socioeconomic History   Marital status: Divorced    Spouse name: Not on file   Number of children: 3   Years of education: Not on file   Highest education level: Not on file  Occupational History   Occupation: Semi retired  Tobacco Use   Smoking status: Former    Current packs/day: 0.00    Types: Cigarettes    Quit date: 11/28/1962    Years since quitting: 61.5   Smokeless tobacco: Never  Vaping Use   Vaping status: Never Used  Substance and Sexual Activity   Alcohol use: No    Alcohol/week: 0.0 standard drinks of alcohol   Drug use: No   Sexual activity: Not Currently  Other Topics Concern   Not on file  Social History Narrative   Not on file   Social Drivers of Health   Financial Resource Strain: Low Risk  (02/06/2024)   Received from [facility]   Overall Financial Resource Strain (CARDIA)    Difficulty of Paying Living Expenses: Not hard at all  Food Insecurity: No Food Insecurity (02/06/2024)   Received from [facility]   Hunger Vital Sign    Within the past 12 months, you worried that your food would run out before you got the money to buy more.: Never true    Within the past 12 months, the food you bought just didn't last and you didn't have money to get more.: Never true  Transportation  Needs: No Transportation Needs (02/06/2024)   Received from [facility]   PRAPARE - Transportation    Lack of Transportation (Medical): No    Lack of Transportation  (Non-Medical): No  Physical Activity: Insufficiently Active (07/12/2023)   Received from [facility]   Exercise Vital Sign    On average, how many days per week do you engage in moderate to strenuous exercise (like a brisk walk)?: 2 days    On average, how many minutes do you engage in exercise at this level?: 10 min  Stress: No Stress Concern Present (07/12/2023)   Received from [facility] of Occupational Health - Occupational Stress Questionnaire    Feeling of Stress : Not at all  Social Connections: Socially Integrated (07/12/2023)   Received from [facility]   Social Network    How would you rate your social network (family, work, friends)?: Good participation with social networks  Intimate Partner Violence: Unknown (07/12/2023)   Received from [facility]   HITS    Over the last 12 months how often did your partner physically hurt you?: Never    Insult or Talk Down To: Not on file    Over the last 12 months how often did your partner threaten you with physical harm?: Never    Over the last 12 months how often did your partner scream or curse at you?: Never    Review of Systems:  All other review of systems negative except as mentioned in the HPI.  Physical Exam: Vital signs LMP 06/23/2014   General:   Alert,  Well-developed, well-nourished, pleasant and cooperative in NAD Airway:  Mallampati  Lungs:  Clear throughout to auscultation.   Heart:  Regular rate and rhythm; no murmurs, clicks, rubs,  or gallops. Abdomen:  Soft, nontender and nondistended. Normal bowel sounds.   Neuro/Psych:  Normal mood and affect. A and O x 3  Inocente Hausen, MD [facility]

## 2024-05-27 ENCOUNTER — Encounter: Payer: Self-pay | Admitting: Pediatrics

## 2024-05-27 ENCOUNTER — Ambulatory Visit (AMBULATORY_SURGERY_CENTER): Admitting: Pediatrics

## 2024-05-27 VITALS — BP 121/69 | HR 70 | Temp 97.3°F | Resp 15 | Ht 73.0 in | Wt 247.0 lb

## 2024-05-27 DIAGNOSIS — K6289 Other specified diseases of anus and rectum: Secondary | ICD-10-CM | POA: Diagnosis not present

## 2024-05-27 DIAGNOSIS — Z1211 Encounter for screening for malignant neoplasm of colon: Secondary | ICD-10-CM

## 2024-05-27 DIAGNOSIS — K648 Other hemorrhoids: Secondary | ICD-10-CM | POA: Diagnosis not present

## 2024-05-27 DIAGNOSIS — K5900 Constipation, unspecified: Secondary | ICD-10-CM

## 2024-05-27 DIAGNOSIS — K644 Residual hemorrhoidal skin tags: Secondary | ICD-10-CM

## 2024-05-27 MED ORDER — SODIUM CHLORIDE 0.9 % IV SOLN: 500.0000 mL | Freq: Once | INTRAVENOUS | Status: DC

## 2024-05-27 NOTE — Progress Notes (Signed)
 Pt's states no medical or surgical changes since previsit or office visit.

## 2024-05-27 NOTE — Progress Notes (Signed)
 Patient refused to be wheel chaired out.  Patient had to wait for medical transportation and did not want to be placed in front of the double doors. Patient was informed it was our policy to have transportation already down there waiting  and had to be placed in front of double doors. Patient got up and walked out. Patient stated she felt like she was trash being placed in front of the double doors.  Manager made aware.

## 2024-05-27 NOTE — Op Note (Addendum)
 [facility] Patient Name: [name] Procedure Date: 05/27/2024 12:55 PM MRN: [id number] Endoscopist: Inocente Hausen , MD, [id number] Age: 63 Referring MD:  Date of Birth: [birth year] Gender: Female Account #: [id number] Procedure:                Colonoscopy Indications:              Screening for colorectal malignant neoplasm, This                            is the patient's first colonoscopy; Incidental                            notation of fecal impaction in April 2025 without                            recurrence Medicines:                Monitored Anesthesia Care Procedure:                Pre-Anesthesia Assessment:                           - Prior to the procedure, a History and Physical                            was performed, and patient medications and                            allergies were reviewed. The patient's tolerance of                            previous anesthesia was also reviewed. The risks                            and benefits of the procedure and the sedation                            options and risks were discussed with the patient.                            All questions were answered, and informed consent                            was obtained. Prior Anticoagulants: The patient has                            taken no anticoagulant or antiplatelet agents. ASA                            Grade Assessment: III - A patient with severe                            systemic disease. After reviewing the risks and  benefits, the patient was deemed in satisfactory                            condition to undergo the procedure.                           After obtaining informed consent, the colonoscope                            was passed under direct vision. Throughout the                            procedure, the patient's blood pressure, pulse, and                            oxygen saturations were monitored continuously.  The                            CF [id number] #[id number] was introduced through the anus                            and advanced to the terminal ileum. The terminal                            ileum, ileocecal valve, appendiceal orifice, and                            rectum were photographed. Scope In: 1:48:19 PM Scope Out: 2:03:51 PM Scope Withdrawal Time: 0 hours 11 minutes 36 seconds  Total Procedure Duration: 0 hours 15 minutes 32 seconds  Findings:                 Skin tags were found on perianal exam.                           The digital rectal exam was normal. Pertinent                            negatives include normal sphincter tone and no                            palpable rectal lesions.                           The colon (entire examined portion) appeared normal.                           The terminal ileum appeared normal.                           Internal hemorrhoids were found during retroflexion.                           Anal papilla(e) were hypertrophied. Complications:            No immediate complications. Estimated blood loss:  None. Estimated Blood Loss:     Estimated blood loss: none. Impression:               - Perianal skin tags found on perianal exam.                           - The entire examined colon is normal.                           - The examined portion of the ileum was normal.                           - Internal hemorrhoids.                           - Anal papilla(e) were hypertrophied.                           - No specimens collected. Recommendation:           - Discharge patient to home (ambulatory).                           - Repeat colonoscopy in 10 years for screening                            purposes.                           - The findings and recommendations were discussed                            with the patient's family.                           - Return to referring physician.                            - Patient has a contact number available for                            emergencies. The signs and symptoms of potential                            delayed complications were discussed with the                            patient. Return to normal activities tomorrow.                            Written discharge instructions were provided to the                            patient. Inocente Hausen, MD 05/27/2024 2:08:05 PM This report has been signed electronically. Addendum Number: 1   Addendum Date: 05/27/2024 3:04:47 PM      The colonoscopy was performed without difficulty. Bowel preparation was  good. Inocente Hausen, MD 05/27/2024 3:05:04 PM This report has been signed electronically.

## 2024-05-27 NOTE — Progress Notes (Signed)
 Sedate, gd SR, tolerated procedure well, VSS, report to RN

## 2024-05-27 NOTE — Patient Instructions (Signed)
 Educational handout provided to patient related to Hemorrhoids  Resume previous diet  Continue present medications  REPEAT COLONOSCOPY IN 10 YEARS FOR SCREENING PURPOSES   YOU HAD AN ENDOSCOPIC PROCEDURE TODAY AT THE [facility]:   Refer to the procedure report that was given to you for any specific questions about what was found during the examination.  If the procedure report does not answer your questions, please call your gastroenterologist to clarify.  If you requested that your care partner not be given the details of your procedure findings, then the procedure report has been included in a sealed envelope for you to review at your convenience later.  YOU SHOULD EXPECT: Some feelings of bloating in the abdomen. Passage of more gas than usual.  Walking can help get rid of the air that was put into your GI tract during the procedure and reduce the bloating. If you had a lower endoscopy (such as a colonoscopy or flexible sigmoidoscopy) you may notice spotting of blood in your stool or on the toilet paper. If you underwent a bowel prep for your procedure, you may not have a normal bowel movement for a few days.  Please Note:  You might notice some irritation and congestion in your nose or some drainage.  This is from the oxygen used during your procedure.  There is no need for concern and it should clear up in a day or so.  SYMPTOMS TO REPORT IMMEDIATELY:  Following lower endoscopy (colonoscopy or flexible sigmoidoscopy):  Excessive amounts of blood in the stool  Significant tenderness or worsening of abdominal pains  Swelling of the abdomen that is new, acute  Fever of 100F or higher  For urgent or emergent issues, a gastroenterologist can be reached at any hour by calling [phone]. Do not use MyChart messaging for urgent concerns.    DIET:  We do recommend a small meal at first, but then you may proceed to your regular diet.  Drink plenty of fluids but you should  avoid alcoholic beverages for 24 hours.  ACTIVITY:  You should plan to take it easy for the rest of today and you should NOT DRIVE or use heavy machinery until tomorrow (because of the sedation medicines used during the test).    FOLLOW UP: Our staff will call the number listed on your records the next business day following your procedure.  We will call around 7:15- 8:00 am to check on you and address any questions or concerns that you may have regarding the information given to you following your procedure. If we do not reach you, we will leave a message.     If any biopsies were taken you will be contacted by phone or by letter within the next 1-3 weeks.  Please call us at [phone] if you have not heard about the biopsies in 3 weeks.    SIGNATURES/CONFIDENTIALITY: You and/or your care partner have signed paperwork which will be entered into your electronic medical record.  These signatures attest to the fact that that the information above on your After Visit Summary has been reviewed and is understood.  Full responsibility of the confidentiality of this discharge information lies with you and/or your care-partner.

## 2024-05-28 ENCOUNTER — Inpatient Hospital Stay: Payer: Medicaid Other | Attending: Gynecologic Oncology

## 2024-05-28 ENCOUNTER — Telehealth: Payer: Self-pay | Admitting: Lactation Services

## 2024-05-28 ENCOUNTER — Ambulatory Visit (HOSPITAL_COMMUNITY)
Admission: RE | Admit: 2024-05-28 | Discharge: 2024-05-28 | Disposition: A | Discharge status: Home / Self Care | Source: Ambulatory Visit | Attending: Hematology and Oncology | Admitting: Hematology and Oncology

## 2024-05-28 DIAGNOSIS — Z452 Encounter for adjustment and management of vascular access device: Secondary | ICD-10-CM | POA: Diagnosis not present

## 2024-05-28 DIAGNOSIS — C541 Malignant neoplasm of endometrium: Secondary | ICD-10-CM | POA: Insufficient documentation

## 2024-05-28 DIAGNOSIS — E538 Deficiency of other specified B group vitamins: Secondary | ICD-10-CM | POA: Diagnosis not present

## 2024-05-28 DIAGNOSIS — E1140 Type 2 diabetes mellitus with diabetic neuropathy, unspecified: Secondary | ICD-10-CM | POA: Insufficient documentation

## 2024-05-28 LAB — CBC WITH DIFFERENTIAL/PLATELET
Abs Immature Granulocytes: 0 10*3/uL (ref 0.00–0.07)
Basophils Absolute: 0 10*3/uL (ref 0.0–0.1)
Basophils Relative: 1 %
Eosinophils Absolute: 0.1 10*3/uL (ref 0.0–0.5)
Eosinophils Relative: 2 %
HCT: 33.6 % — ABNORMAL LOW (ref 36.0–46.0)
Hemoglobin: 11.2 g/dL — ABNORMAL LOW (ref 12.0–15.0)
Immature Granulocytes: 0 %
Lymphocytes Relative: 52 %
Lymphs Abs: 1.8 10*3/uL (ref 0.7–4.0)
MCH: 31.6 pg (ref 26.0–34.0)
MCHC: 33.3 g/dL (ref 30.0–36.0)
MCV: 94.9 fL (ref 80.0–100.0)
Monocytes Absolute: 0.2 10*3/uL (ref 0.1–1.0)
Monocytes Relative: 7 %
Neutro Abs: 1.3 10*3/uL — ABNORMAL LOW (ref 1.7–7.7)
Neutrophils Relative %: 38 %
Platelets: 199 10*3/uL (ref 150–400)
RBC: 3.54 MIL/uL — ABNORMAL LOW (ref 3.87–5.11)
RDW: 13.2 % (ref 11.5–15.5)
WBC: 3.4 10*3/uL — ABNORMAL LOW (ref 4.0–10.5)
nRBC: 0 % (ref 0.0–0.2)

## 2024-05-28 LAB — COMPREHENSIVE METABOLIC PANEL WITH GFR
ALT: 24 U/L (ref 0–44)
AST: 17 U/L (ref 15–41)
Albumin: 4.2 g/dL (ref 3.5–5.0)
Alkaline Phosphatase: 78 U/L (ref 38–126)
Anion gap: 6 (ref 5–15)
BUN: 13 mg/dL (ref 8–23)
CO2: 28 mmol/L (ref 22–32)
Calcium: 9.4 mg/dL (ref 8.9–10.3)
Chloride: 105 mmol/L (ref 98–111)
Creatinine, Ser: 0.73 mg/dL (ref 0.44–1.00)
GFR, Estimated: >60 mL/min (ref >60)
Glucose, Bld: 273 mg/dL — ABNORMAL HIGH (ref 70–99)
Potassium: 4.1 mmol/L (ref 3.5–5.1)
Sodium: 139 mmol/L (ref 135–145)
Total Bilirubin: 0.4 mg/dL (ref 0.0–1.2)
Total Protein: 6.9 g/dL (ref 6.5–8.1)

## 2024-05-28 MED ORDER — CYANOCOBALAMIN 1000 MCG/ML IJ SOLN
1000.0000 ug | Freq: Once | INTRAMUSCULAR | Status: AC
  Administered 2024-05-28: 1000 ug via INTRAMUSCULAR
  Filled 2024-05-28: qty 1

## 2024-05-28 MED ORDER — HEPARIN SOD (PORK) LOCK FLUSH 100 UNIT/ML IV SOLN
500.0000 [IU] | Freq: Once | INTRAVENOUS | Status: AC
  Administered 2024-05-28: 500 [IU] via INTRAVENOUS

## 2024-05-28 MED ORDER — HEPARIN SOD (PORK) LOCK FLUSH 100 UNIT/ML IV SOLN
INTRAVENOUS | Status: AC
  Filled 2024-05-28: qty 5

## 2024-05-28 MED ORDER — IOHEXOL 300 MG/ML  SOLN
100.0000 mL | Freq: Once | INTRAMUSCULAR | Status: AC | PRN
  Administered 2024-05-28: 100 mL via INTRAVENOUS

## 2024-05-28 MED ORDER — SODIUM CHLORIDE 0.9% FLUSH
10.0000 mL | Freq: Once | INTRAVENOUS | Status: AC
  Administered 2024-05-28: 10 mL

## 2024-05-28 NOTE — Telephone Encounter (Signed)
  Follow up Call-     05/27/2024   12:47 PM  Call back number  Post procedure Call Back phone  # [phone]  Permission to leave phone message Yes     Patient questions:  Do you have a fever, pain , or abdominal swelling? No. Pain Score  0 *  Have you tolerated food without any problems? Yes.    Have you been able to return to your normal activities? Yes.    Do you have any questions about your discharge instructions: Diet   No. Medications  No. Follow up visit  No.  Do you have questions or concerns about your Care? No.  Actions: * If pain score is 4 or above: No action needed, pain <4.

## 2024-06-04 ENCOUNTER — Encounter: Payer: Self-pay | Admitting: Hematology and Oncology

## 2024-06-04 ENCOUNTER — Inpatient Hospital Stay (HOSPITAL_BASED_OUTPATIENT_CLINIC_OR_DEPARTMENT_OTHER): Payer: Medicaid Other | Admitting: Hematology and Oncology

## 2024-06-04 VITALS — BP 124/44 | HR 84 | Temp 98.0°F | Resp 17 | Wt 256.2 lb

## 2024-06-04 DIAGNOSIS — C541 Malignant neoplasm of endometrium: Secondary | ICD-10-CM | POA: Diagnosis not present

## 2024-06-04 DIAGNOSIS — E1340 Other specified diabetes mellitus with diabetic neuropathy, unspecified: Secondary | ICD-10-CM | POA: Diagnosis not present

## 2024-06-04 DIAGNOSIS — E538 Deficiency of other specified B group vitamins: Secondary | ICD-10-CM

## 2024-06-04 NOTE — Assessment & Plan Note (Addendum)
 She has chronic vitamin B12 deficiency and is not able to give herself vitamin B12 injection Previously, we space out her injection to every other month We will keep it the same Plan to recheck her labs again in of the year

## 2024-06-04 NOTE — Assessment & Plan Note (Addendum)
 Patient was diagnosed with uterine cancer in March 2023, status post surgery and completion of adjuvant chemotherapy and radiation therapy by April 2023 Final pathology carcinosarcoma I have reviewed imaging studies with the patient which show no evidence of cancer recurrence We discussed port removal I will see her again at the end of the year for further follow-up I recommend GYN follow-up for pelvic exam

## 2024-06-04 NOTE — Assessment & Plan Note (Addendum)
 Her recent blood sugar is very high I recommend the patient risk factor modification and continue management by her primary care doctor

## 2024-06-04 NOTE — Progress Notes (Signed)
 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[facility]) Patient was diagnosed with uterine cancer in March 2023, status post surgery and completion of adjuvant chemotherapy and radiation therapy by April 2023 Final pathology carcinosarcoma I have reviewed imaging studies with the patient which show no evidence of cancer recurrence We discussed port removal I will see her again at the end of the year for further follow-up I recommend GYN follow-up for pelvic exam Vitamin B12 deficiency She has chronic vitamin B12 deficiency and is not able to give herself vitamin B12 injection Previously, we space out her injection to every other month We will keep it the same Plan to recheck her labs again in of the year 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HCC) Her recent blood sugar is very high I recommend the patient risk factor modification and continue management by her primary care doctor  Orders Placed This Encounter  Procedures   IR REMOVAL TUN ACCESS W/ PORT W/O FL MOD SED    Standing Status:   Future    Expected Date:   06/11/2024    Expiration Date:   06/04/2025    Reason for exam::   no longer need port    Preferred Imaging Location?:   [facility]   Vitamin B12    Standing Status:   Future    Expiration Date:   06/04/2025     Almarie Bedford, MD  INTERVAL HISTORY: she returns for surveillance follow-up for history of uterine cancer She is doing well No recent changes in her bowel habits We discussed vitamin B12 injection moving forward  PHYSICAL EXAMINATION: ECOG PERFORMANCE STATUS: 0 - Asymptomatic  Vitals:   06/04/24 1043  BP: (!) 124/44  Pulse: 84  Resp: 17  Temp: 98 F (36.7 C)  SpO2: 100%   Filed Weights   06/04/24 1043  Weight: 256 lb 3.2 oz (116.2 kg)    Relevant data reviewed during this visit included CBC and CMP, CT imaging from  July 2025

## 2024-06-14 ENCOUNTER — Other Ambulatory Visit: Payer: Self-pay | Admitting: Hematology and Oncology

## 2024-06-14 ENCOUNTER — Ambulatory Visit (HOSPITAL_COMMUNITY)
Admission: RE | Admit: 2024-06-14 | Discharge: 2024-06-14 | Disposition: A | Discharge status: Home / Self Care | Source: Ambulatory Visit | Attending: Hematology and Oncology | Admitting: Hematology and Oncology

## 2024-06-14 DIAGNOSIS — C541 Malignant neoplasm of endometrium: Secondary | ICD-10-CM | POA: Insufficient documentation

## 2024-06-14 HISTORY — PX: IR REMOVAL TUN ACCESS W/ PORT W/O FL MOD SED: IMG2290

## 2024-06-14 MED ORDER — LIDOCAINE HCL 1 % IJ SOLN
INTRAMUSCULAR | Status: AC
  Filled 2024-06-14: qty 20

## 2024-06-14 MED ORDER — LIDOCAINE HCL 1 % IJ SOLN
20.0000 mL | Freq: Once | INTRAMUSCULAR | Status: AC
  Administered 2024-06-14: 5 mL via INTRADERMAL

## 2024-06-26 ENCOUNTER — Other Ambulatory Visit: Payer: Self-pay | Admitting: Physician Assistant

## 2024-06-26 DIAGNOSIS — Z1231 Encounter for screening mammogram for malignant neoplasm of breast: Secondary | ICD-10-CM

## 2024-07-06 ENCOUNTER — Ambulatory Visit
Admission: RE | Admit: 2024-07-06 | Discharge: 2024-07-06 | Disposition: A | Discharge status: Home / Self Care | Source: Ambulatory Visit | Attending: Physician Assistant | Admitting: Physician Assistant

## 2024-07-06 DIAGNOSIS — Z1231 Encounter for screening mammogram for malignant neoplasm of breast: Secondary | ICD-10-CM

## 2024-07-24 ENCOUNTER — Telehealth: Payer: Self-pay | Admitting: Hematology and Oncology

## 2024-07-30 ENCOUNTER — Inpatient Hospital Stay

## 2024-07-30 ENCOUNTER — Inpatient Hospital Stay: Attending: Gynecologic Oncology

## 2024-07-30 DIAGNOSIS — C541 Malignant neoplasm of endometrium: Secondary | ICD-10-CM

## 2024-07-30 DIAGNOSIS — E538 Deficiency of other specified B group vitamins: Secondary | ICD-10-CM | POA: Diagnosis present

## 2024-07-30 MED ORDER — CYANOCOBALAMIN 1000 MCG/ML IJ SOLN
1000.0000 ug | Freq: Once | INTRAMUSCULAR | Status: AC
  Administered 2024-07-30: 1000 ug via INTRAMUSCULAR

## 2024-07-30 NOTE — Patient Instructions (Signed)
 Vitamin B12 Deficiency Vitamin B12 deficiency means that your body does not have enough vitamin B12. The body needs this important vitamin: To make red blood cells. To make genes (DNA). To help the nerves work. If you do not have enough vitamin B12 in your body, you can have health problems, such as not having enough red blood cells in the blood (anemia). What are the causes? Not eating enough foods that contain vitamin B12. Not being able to take in (absorb) vitamin B12 from the food that you eat. Certain diseases. A condition in which the body does not make enough of a certain protein. This results in your body not taking in enough vitamin B12. Having a surgery in which part of the stomach or small intestine is taken out. Taking medicines that make it hard for the body to take in vitamin B12. These include: Heartburn medicines. Some medicines that are used to treat diabetes. What increases the risk? Being an older adult. Eating a vegetarian or vegan diet that does not include any foods that come from animals. Not eating enough foods that contain vitamin B12 while you are pregnant. Taking certain medicines. Having alcoholism. What are the signs or symptoms? In some cases, there are no symptoms. If the condition leads to too few blood cells or nerve damage, symptoms can occur, such as: Feeling weak or tired. Not being hungry. Losing feeling (numbness) or tingling in your hands and feet. Redness and burning of the tongue. Feeling sad (depressed). Confusion or memory problems. Trouble walking. If anemia is very bad, symptoms can include: Being short of breath. Being dizzy. Having a very fast heartbeat. How is this treated? Changing the way you eat and drink, such as: Eating more foods that contain vitamin B12. Drinking little or no alcohol. Getting vitamin B12 shots. Taking vitamin B12 supplements by mouth (orally). Your doctor will tell you the dose that is best for you. Follow  these instructions at home: Eating and drinking  Eat foods that come from animals and have a lot of vitamin B12 in them. These include: Meats and poultry. This includes beef, pork, chicken, [place], and organ meats, such as liver. Seafood, such as clams, rainbow trout, salmon, tuna, and haddock. Eggs. Dairy foods such as milk, yogurt, and cheese. Eat breakfast cereals that have vitamin B12 added to them (are fortified). Check the label. The items listed above may not be a complete list of foods and beverages you can eat and drink. Contact a dietitian for more information. Alcohol use Do not drink alcohol if: Your doctor tells you not to drink. You are pregnant, may be pregnant, or are planning to become pregnant. If you drink alcohol: Limit how much you have to: 0-1 drink a day for women. 0-2 drinks a day for men. Know how much alcohol is in your drink. In the [place] one drink equals one 12 oz bottle of beer (355 mL), one 5 oz glass of wine (148 mL), or one 1 oz glass of hard liquor (44 mL). General instructions Get any vitamin B12 shots if told by your doctor. Take supplements only as told by your doctor. Follow the directions. Keep all follow-up visits. Contact a doctor if: Your symptoms come back. Your symptoms get worse or do not get better with treatment. Get help right away if: You have trouble breathing. You have a very fast heartbeat. You have chest pain. You get dizzy. You faint. These symptoms may be an emergency. Get help right away. Call 911.  Do not wait to see if the symptoms will go away. Do not drive yourself to the hospital. Summary Vitamin B12 deficiency means that your body is not getting enough of the vitamin. In some cases, there are no symptoms of this condition. Treatment may include making a change in the way you eat and drink, getting shots, or taking supplements. Eat foods that have vitamin B12 in them. This information is not intended to replace advice  given to you by your health care provider. Make sure you discuss any questions you have with your health care provider. Document Revised: 07/09/2021 Document Reviewed: 07/09/2021 [employer]vier Patient Education  2024 [employer].

## 2024-08-05 ENCOUNTER — Ambulatory Visit (INDEPENDENT_AMBULATORY_CARE_PROVIDER_SITE_OTHER): Admitting: Podiatry

## 2024-08-05 DIAGNOSIS — Z91199 Patient's noncompliance with other medical treatment and regimen due to unspecified reason: Secondary | ICD-10-CM

## 2024-08-05 NOTE — Progress Notes (Signed)
 Cancel 24 ours sick

## 2024-08-21 ENCOUNTER — Ambulatory Visit (INDEPENDENT_AMBULATORY_CARE_PROVIDER_SITE_OTHER): Payer: Self-pay | Admitting: Podiatry

## 2024-08-21 DIAGNOSIS — Z91199 Patient's noncompliance with other medical treatment and regimen due to unspecified reason: Secondary | ICD-10-CM

## 2024-08-21 NOTE — Progress Notes (Signed)
 No show

## 2024-09-12 ENCOUNTER — Inpatient Hospital Stay: Attending: Gynecologic Oncology | Admitting: Gynecologic Oncology

## 2024-09-12 ENCOUNTER — Encounter: Payer: Self-pay | Admitting: Gynecologic Oncology

## 2024-09-12 VITALS — BP 134/60 | HR 87 | Temp 98.3°F | Resp 20 | Wt 262.8 lb

## 2024-09-12 DIAGNOSIS — Z8542 Personal history of malignant neoplasm of other parts of uterus: Secondary | ICD-10-CM | POA: Insufficient documentation

## 2024-09-12 DIAGNOSIS — Z9221 Personal history of antineoplastic chemotherapy: Secondary | ICD-10-CM | POA: Insufficient documentation

## 2024-09-12 DIAGNOSIS — C541 Malignant neoplasm of endometrium: Secondary | ICD-10-CM

## 2024-09-12 DIAGNOSIS — Z923 Personal history of irradiation: Secondary | ICD-10-CM | POA: Insufficient documentation

## 2024-09-12 DIAGNOSIS — Z9079 Acquired absence of other genital organ(s): Secondary | ICD-10-CM | POA: Insufficient documentation

## 2024-09-12 DIAGNOSIS — Z90710 Acquired absence of both cervix and uterus: Secondary | ICD-10-CM | POA: Insufficient documentation

## 2024-09-12 DIAGNOSIS — Z90722 Acquired absence of ovaries, bilateral: Secondary | ICD-10-CM | POA: Insufficient documentation

## 2024-09-12 NOTE — Patient Instructions (Signed)
 It was good to see you today.  I do not see or feel any evidence of cancer recurrence on your exam.  I will see you for follow-up in 6 months.  As always, if you develop any new and concerning symptoms before your next visit, please call to see me sooner.

## 2024-09-12 NOTE — Progress Notes (Signed)
 Gynecologic Oncology Return Clinic Visit  09/12/24  Reason for Visit: surveillance  Treatment History: Oncology History Overview Note  P53 wild type   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(HCC)  02/01/2022 Initial Diagnosis   She presented with postmenopausal bleeding   02/07/2022 Imaging   US  pelvis Thickened heterogeneous endometrium with masslike region with internal vascularity. Findings may be associated with endometrial carcinoma or hyperplasia. Biopsy is recommended for further evaluation.   02/22/2022 Initial Biopsy   EMB: MMMT/carcinosarcoma (predominance of sarcomatous component)   03/05/2022 Imaging   Ct chest, abdomen and pelvis 1. Heterogeneous enlargement of the endometrial cavity is compatible with the reported history of uterine carcinosarcoma. No definite involvement of the posterior bladder wall or sigmoid colon/rectum. 2. 9 mm short axis right external iliac node is upper normal for size. Attention on follow-up recommended. Otherwise no lymphadenopathy in the abdomen or pelvis. 3. 3.6 x 2.7 cm lesion in the lateral segment left liver has subtle peripheral nodular enhancement. This is almost certainly a benign cavernous hemangioma, but MRI abdomen with and without contrast recommended to confirm. 4. Aortic Atherosclerosis (ICD10-I70.0).   03/08/2022 Initial Diagnosis   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(HCC)   03/30/2022 Surgery   TRH/BSO, SLN biopsy on right, left pelvic LND, LOS, mini-lap  Findings: On EUA, 12cm minimally mobile uterus. On intra-abdominal exam, normal upper abdominal survey. Omentum adherent to the anterior abdominal wall along the prior midline incision. Normal omentum otherwise, normal small and large bowel. 12 cm uterus densely adherent to the anterior abdominal wall, obliterating some of the anterior anatomy including the anterior cul de sac. Normal appearing bilateral adnexa. Mapping successful to right obturator SLN, mildly enlarged. Dye seen within the  parametrium on the left, no SLNs identified. No obvious adenopathy on the left. Decision made given length of surgery, comorbitidies to defer left para-aortic lymphadenectomy. Mini-lap required for specimen delivery. Dome intact on cystoscopy and good efflux noted from bilateral ureteral orifices.   03/30/2022 Pathology Results   MMMT/carcinosarcoma, 6.3 cm MI 1cm or 3 (<50%) Cervical stroma, bilateral tubes/ovaries benign R SLN and L pelvic LNDs negative  ONCOLOGY TABLE:   UTERUS, CARCINOMA OR CARCINOSARCOMA: Resection   Procedure: Total hysterectomy and bilateral salpingo-oophorectomy  Histologic Type:  Malignant mixed Mullerian tumor (MMMT/ carcinosarcoma)  Histologic Grade: High-grade  Myometrial Invasion:       Depth of Myometrial Invasion (mm): 10 mm       Myometrial Thickness (mm): 30 mm       Percentage of Myometrial Invasion: 33%  Uterine Serosa Involvement: Not identified  Cervical stromal Involvement: Not identified  Extent of involvement of other tissue/organs: Not identified  Peritoneal/Ascitic Fluid: Not applicable  Lymphovascular Invasion: Not identified  Regional Lymph Nodes:       Pelvic Lymph Nodes Examined:                                   1 Sentinel                                   6 Non-sentinel                                   7 Total       Pelvic Lymph Nodes with Metastasis: 0  Macrometastasis: (>2.0 mm): 0                           Micrometastasis: (>0.2 mm and < 2.0 mm): 0                           Isolated Tumor Cells (<0.2 mm): 0                           Laterality of Lymph Node with Tumor: Not  applicable                           Extracapsular Extension: Not applicable       Para-aortic Lymph Nodes Examined:                                    0 Sentinel                                    0 non-sentinel                                    0 total  Distant Metastasis:       Distant Site(s) Involved: Not applicable   Pathologic Stage Classification (pTNM, AJCC 8th Edition): pT1a, pN0  Ancillary Studies: MMR / MSI testing will be ordered  Representative Tumor Block: B1  Comment(s): Pancytokeratin was performed on the lymph nodes and is  negative.    04/08/2022 Cancer Staging   Staging form: Corpus Uteri - Carcinoma and Carcinosarcoma, AJCC 8th Edition - Pathologic stage from 04/08/2022: FIGO Stage IA (pT1a, pN0, cM0) - Signed by Lonn Hicks, MD on 04/08/2022 Stage prefix: Initial diagnosis   04/21/2022 Procedure   Placement of single lumen port a cath via right internal jugular vein. The catheter tip lies at the cavo-atrial junction. A power injectable port a cath was placed and is ready for immediate use.       04/29/2022 - 08/18/2022 Chemotherapy   Patient is on Treatment Plan : UTERINE Carboplatin  AUC 6 / Paclitaxel  q21d     06/13/2022 - 07/11/2022 Radiation Therapy   06/13/2022 through 07/11/2022 Site Technique Total Dose (Gy) Dose per Fx (Gy) Completed Fx Beam Energies  Vagina: Pelvis HDR-brachy 30/30 6 5/5 Ir-192     09/14/2022 Imaging   Narrative & Impression   EXAM: CT ABDOMEN AND PELVIS WITH CONTRAST   TECHNIQUE: Multidetector CT imaging of the abdomen and pelvis was performed using the standard protocol following bolus administration of intravenous contrast.   RADIATION DOSE REDUCTION: This exam was performed according to the departmental dose-optimization program which includes automated exposure control, adjustment of the mA and/or kV according to patient size and/or use of iterative reconstruction technique.   CONTRAST:  OMNIPAQUE  IOHEXOL  300 MG/ML  SOLN   COMPARISON:  March 04, 2022   FINDINGS: Lower chest: Basilar atelectasis. No effusion or consolidative changes.   Hepatobiliary: LEFT hepatic lobe lesion at 3.4 cm is unchanged since April of 2023 with peripheral and nodular enhancement and centripetal filling noted on previous imaging compatible with large hepatic  hemangioma.   Small hypodensity in the  posterior RIGHT hemiliver not definitely seen on previous imaging (image 19/2) 8 mm, perhaps present on the prior study but masked by artifact from the patient's arm.   Low-density focus in the posterior RIGHT hemiliver, hepatic subsegment VII (image 16/2) 11 mm, this is stable. There is background hepatic steatosis.   Subtle area of low attenuation in the RIGHT hemiliver (image 31/2) 9 mm. Portal vein is patent. No pericholecystic stranding or signs of biliary duct distension.   Pancreas: Normal, without mass, inflammation or ductal dilatation.   Spleen: Normal.   Adrenals/Urinary Tract:   Adrenal glands are unremarkable. Symmetric renal enhancement. No sign of hydronephrosis. No suspicious renal lesion or perinephric stranding.   Urinary bladder is grossly unremarkable.   Stomach/Bowel: No acute gastrointestinal findings. Appendix is normal.   Vascular/Lymphatic:   Aortic atherosclerosis. No sign of aneurysm. Smooth contour of the IVC. There is no gastrohepatic or hepatoduodenal ligament lymphadenopathy. No retroperitoneal or mesenteric lymphadenopathy.   No pelvic sidewall lymphadenopathy.   Mild expansion of the RIGHT ovarian vein with central low attenuation that suggests thrombus within ligated ovarian vein. Delayed images without signs of propagation into the IVC.   Reproductive: Post hysterectomy. Small amount of fluid in the surgical bed (image 81/2) no peripheral enhancement. This area measuring 4.2 x 1.9 cm. Scattered areas of fluid density which are smaller, other areas less than a cm. Mild fascial thickening along the RIGHT pelvic sidewall. (Image 75/2) mild serosal thickening of the sigmoid colon is suggested. No peritoneal nodularity outside of the pelvis.   Other: As above   Musculoskeletal: No acute bone finding. No destructive bone process. Spinal degenerative changes.   IMPRESSION: 1. Post hysterectomy. Small amount of  fluid in the surgical bed no peripheral enhancement. This area measuring 4.2 x 1.9 cm. This and other findings in the pelvis such as serosal thickening along the sigmoid and fascial thickening along the RIGHT pelvis may reflect postoperative changes. Would suggest close attention on follow-up to exclude the possibility of peritoneal involvement. 2. Mild expansion of the RIGHT ovarian vein with central low attenuation suggests ovarian venous thrombus. 3. Hepatic lesions 2 of which were definitively present on prior imaging in hepatic subsegment VII and in the lateral segment of the LEFT hepatic lobe, lateral segment LEFT hepatic lobe lesion is compatible with benign hemangioma. There are 2 lesions which may be new and or mast on the prior study by artifact and are indeterminate. Suggest hepatic MRI for further assessment. At the time of hepatic MRI, MRV sequences could be obtained if warranted to assess for RIGHT ovarian venous thrombosis. 4. Background hepatic steatosis. 5. Aortic atherosclerosis, mild.     12/14/2022 Imaging   CT scan  Again surgical changes from previous hysterectomy. The previous fluid in the dependent pelvis is improved with some residual stranding. Slight soft tissue thickening along the fascial planes of the pelvis but no discrete mass lesion or lymph node enlargement.   Stable in the right gonadal, ovarian vein. Small amount of thrombus is possible and stable.   Fatty liver infiltration with a segment 2 hemangioma. Faint low-attenuation small foci in segment 7 of the liver on the prior today 1 is stable in the other is not as well seen. Recommend continued surveillance   Aortic atherosclerosis    12/05/2023 Imaging   CT ABDOMEN PELVIS W CONTRAST Result Date: 12/09/2023 CLINICAL DATA:  History of endometrial cancer, high-risk uterine cancer, monitor. * Tracking Code: BO * EXAM: CT ABDOMEN AND PELVIS WITH CONTRAST TECHNIQUE:  Multidetector CT imaging of the abdomen and pelvis  was performed using the standard protocol following bolus administration of intravenous contrast. RADIATION DOSE REDUCTION: This exam was performed according to the departmental dose-optimization program which includes automated exposure control, adjustment of the mA and/or kV according to patient size and/or use of iterative reconstruction technique. CONTRAST:  OMNIPAQUE  IOHEXOL  300 MG/ML  SOLN COMPARISON:  Multiple priors including most recent CT December 14, 2022 FINDINGS: Lower chest: No acute abnormality. Hepatobiliary: Stable 3.5 cm hemangioma in the left lobe of the liver. Hyperenhancing 11 mm focus in the posterior right lobe of the liver on image 12/2 this was previously hypodense on prior examination from September 13, 2022 but unchanged in size at 11 mm in favored to reflect a hemangioma. Stable 9 mm hypodensity along the inferior right lobe of the liver on image 28/2 favored to reflect a benign etiology such as hemangioma. No new suspicious hepatic lesions. Gallbladder is nondistended.  No biliary ductal dilation. Pancreas: No pancreatic ductal dilation or evidence of acute inflammation. Spleen: No splenomegaly. Adrenals/Urinary Tract: Bilateral adrenal glands appear normal. No hydronephrosis. Kidneys demonstrate symmetric enhancement. Urinary bladder is unremarkable for degree of distension. Stomach/Bowel: No radiopaque contrast material was administered. Stomach is unremarkable for degree of distension. No pathologic dilation of small or large bowel. Normal appendix. No evidence of acute bowel inflammation. Vascular/Lymphatic: Normal caliber abdominal aorta. Smooth IVC contours. The portal, splenic and superior mesenteric veins are patent. Similar slightly expansile filling defect in the right gonadal vein on image 44/2. No pathologically enlarged abdominal or pelvic lymph nodes. Reproductive: Uterus is surgically absent without suspicious nodularity along the vaginal cuff. No suspicious adnexal  mass. Other: No significant abdominopelvic free fluid. Postsurgical change in the abdominal wall. Musculoskeletal: No aggressive lytic or blastic lesion of bone. IMPRESSION: 1. Status post hysterectomy without evidence of recurrent or metastatic disease in the abdomen or pelvis. 2. Stable hepatic lesions favored hemangiomas. No new suspicious hepatic lesions. 3. Stable hypodense expansile appearance of the mid gonadal vein possibly reflecting thrombus. Electronically Signed   By: Reyes Holder M.D.   On: 12/09/2023 09:58      05/28/2024 Imaging   CT ABDOMEN PELVIS W CONTRAST Result Date: 06/01/2024 CLINICAL DATA:  Uterine cancer monitor EXAM: CT ABDOMEN AND PELVIS WITH CONTRAST TECHNIQUE: Multidetector CT imaging of the abdomen and pelvis was performed using the standard protocol following bolus administration of intravenous contrast. RADIATION DOSE REDUCTION: This exam was performed according to the departmental dose-optimization program which includes automated exposure control, adjustment of the mA and/or kV according to patient size and/or use of iterative reconstruction technique. CONTRAST:  OMNIPAQUE  IOHEXOL  300 MG/ML  SOLN COMPARISON:  December 05, 2023, December 14, 2022, September 13, 2022 FINDINGS: Lower chest: No focal airspace consolidation or pleural effusion.Calcified granuloma in the right lower lobe. Hepatobiliary: Unchanged left hepatic lobe hemangioma, measuring 3.5 cm. Small flash filling hemangioma also again noted in the right lower lobe measuring 9 mm (axial 28). A second small hemangioma present in the right hepatic lobe (axial 13). The posteromedial hemangioma in the right hepatic lobe on the prior study is hypodense today (axial 18). Decompressed gallbladder without radiopaque stones or wall thickening. No intrahepatic or extrahepatic biliary ductal dilation. The portal veins are patent. Pancreas: No mass or main ductal dilation. No peripancreatic inflammation or fluid collection.  Spleen: Normal size. No mass. Adrenals/Urinary Tract: No adrenal masses. No renal mass. No nephrolithiasis or hydronephrosis. The urinary bladder is distended without focal abnormality. Stomach/Bowel:  The stomach is decompressed without focal abnormality. No small bowel wall thickening or inflammation. No small bowel obstruction.Normal appendix. Vascular/Lymphatic: No aortic aneurysm. Scattered aortoiliac atherosclerosis. No intraabdominal or pelvic lymphadenopathy. 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 Reproductive: Hysterectomy.No free pelvic fluid. Other: No pneumoperitoneum, ascites, or mesenteric inflammation. Musculoskeletal: No acute fracture or destructive lesion. Multilevel degenerative disc disease of the spine. IMPRESSION: No acute intra-abdominal or pelvic abnormality. No findings of distant metastatic disease within the abdomen or pelvis. Aortic Atherosclerosis (ICD10-I70.0). Electronically Signed   By: Rogelia Myers M.D.   On: 06/01/2024 13:42      06/14/2024 Procedure   Removal of implanted Port-A-Cath utilizing sharp and blunt dissection. The procedure was uncomplicated.       Interval History: Doing well.  Denies any abdominal or pelvic pain.  Denies vaginal bleeding.  Reports baseline bowel and bladder function.   Has not been using her dilator.  Lost about 30 pounds on Ozempic .  Weight has plateaued.  Past Medical/Surgical History: Past Medical History:  Diagnosis Date   Anemia    Asthma 11/01/2010   only when respiratory infections   BPPV (benign paroxysmal positional vertigo) 03/07/2013   Broken teeth 11/02/2016   Callus of toe 07/07/2022   Colon cancer screening 05/05/2020   Diabetes mellitus    since 1983   Difficult intravenous access    Dizziness 09/22/2021   Elevated TSH 05/13/2013   Endometrial cancer (HCC)    Gangrene of toe of right foot (HCC) 04/14/2021   GERD (gastroesophageal reflux disease)    Hand pain, right 03/14/2019   History  of radiation therapy    Vagina- 06/13/22-07/11/22-Dr. 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   Qualifier: Diagnosis of  By: Gladis FNP, [place]kedtra     Osteomyelitis [facility])    Pneumonia    as a child   Wrist pain 04/11/2013    Past Surgical History:  Procedure Laterality Date   ABDOMINAL HYSTERECTOMY     ACHILLES TENDON LENGTHENING Left 08/29/2014   Procedure: Left Achilles Lengthening;  Surgeon: Jerona Harden GAILS, MD;  Location: [facility] OR;  Service: Orthopedics;  Laterality: Left;   AMPUTATION Left 02/19/2013   Procedure: Amputation of Left Great Toe;  Surgeon: Lonni CINDERELLA Poli, MD;  [facility] OR;  Service: Orthopedics;  Laterality: Left;   AMPUTATION Left 08/29/2014   Procedure: Left Foot 2nd and 1st Toe Amputation;  Surgeon: Jerona Harden GAILS, MD;  Location: [facility] OR;  Service: Orthopedics;  Laterality: Left;   CARDIAC CATHETERIZATION     15 years ago   CESAREAN SECTION     x 4   CYSTOSCOPY  03/30/2022   Procedure: CYSTOSCOPY;  Surgeon: Viktoria Comer SAUNDERS, MD;  Location: [facility] ORS;  Service: Gynecology;;   IR IMAGING GUIDED PORT INSERTION  04/19/2022   IR REMOVAL TUN ACCESS W/ PORT W/O FL MOD SED  06/14/2024   IRRIGATION AND DEBRIDEMENT FOOT Right 10/02/2023   Procedure: IRRIGATION AND DEBRIDEMENT FOOT, bone biopsy RIGHT GREAT TOE AND RIGHT 5TH TOE;  Surgeon: Malvin Marsa FALCON, DPM;  Location: [facility] OR;  Service: Orthopedics/Podiatry;  Laterality: Right;   ROBOTIC ASSISTED TOTAL HYSTERECTOMY WITH BILATERAL SALPINGO OOPHERECTOMY Bilateral 03/30/2022   Procedure: XI ROBOTIC ASSISTED TOTAL HYSTERECTOMY WITH BILATERAL SALPINGO-OOPHORECTOMY, MINI LAPAROTOMY;  Surgeon: Viktoria Comer SAUNDERS, MD;  Location: [facility] ORS;  Service: Gynecology;  Laterality: Bilateral;   SENTINEL NODE  BIOPSY N/A 03/30/2022   Procedure: SENTINEL NODE BIOPSY;  Surgeon: Viktoria Comer SAUNDERS, MD;  Location: [facility]  ORS;  Service: Gynecology;  Laterality: N/A;   TUBAL LIGATION     at one of c-sections    Family History  Problem Relation Age of Onset   Hypertension Mother    Dementia Mother    Heart attack Father    Heart disease Father    Diabetes Father    Hypertension Father    Diabetes Daughter    Colon cancer Neg Hx    Breast cancer Neg Hx    Ovarian cancer Neg Hx    Endometrial cancer Neg Hx    Pancreatic cancer Neg Hx    Prostate cancer Neg Hx    Rectal cancer Neg Hx    Stomach cancer Neg Hx    Colon polyps Neg Hx    Esophageal cancer Neg Hx     Social History   Socioeconomic History   Marital status: Divorced    Spouse name: Not on file   Number of children: 3   Years of education: Not on file   Highest education level: Not on file  Occupational History   Occupation: Semi retired  Tobacco Use   Smoking status: Former    Current packs/day: 0.00    Types: Cigarettes    Quit date: 11/28/1962    Years since quitting: 61.8   Smokeless tobacco: Never  Vaping Use   Vaping status: Never Used  Substance and Sexual Activity   Alcohol use: No    Alcohol/week: 0.0 standard drinks of alcohol   Drug use: No   Sexual activity: Not Currently  Other Topics Concern   Not on file  Social History Narrative   Not on file   Social Drivers of Health   Financial Resource Strain: Low Risk  (02/06/2024)   Received from [facility]   Overall Financial Resource Strain (CARDIA)    Difficulty of Paying Living Expenses: Not hard at all  Food Insecurity: No Food Insecurity (02/06/2024)   Received [facility]   Hunger Vital Sign    Within the past 12 months, you worried that your food would run out before you got the money to buy more.: Never true    Within the past 12 months, the food you bought just didn't last and you didn't have money to get more.: Never true  Transportation Needs: No Transportation Needs (02/06/2024)   Received from [facility] - Transportation     Lack of Transportation (Medical): No    Lack of Transportation (Non-Medical): No  Physical Activity: Insufficiently Active (07/12/2023)   Received from [facility]   Exercise Vital Sign    On average, how many days per week do you engage in moderate to strenuous exercise (like a brisk walk)?: 2 days    On average, how many minutes do you engage in exercise at this level?: 10 min  Stress: No Stress Concern Present (07/12/2023)   Received from [facility] - Occupational Stress Questionnaire    Feeling of Stress : Not at all  Social Connections: Socially Integrated (07/12/2023)   Received from [facility]   Social Network    How would you rate your social network (family, work, friends)?: Good participation with social networks    Current Medications:  Current Outpatient Medications:    Accu-Chek Softclix Lancets lancets, CHECK BLOOD SUGAR 4 TIMES A DAY BEFORE MEALS AND BEDTIME,  Disp: 204 each, Rfl: 10   acetaminophen  (TYLENOL ) 650 MG CR tablet, Take 1,300-1,950 mg by mouth every 8 (eight) hours as needed for pain., Disp: , Rfl:    atorvastatin  (LIPITOR ) 80 MG tablet, Take 1 tablet (80 mg total) by mouth daily., Disp: 90 tablet, Rfl: 3   cyanocobalamin  (VITAMIN B12) 1000 MCG/ML injection, Inject 1 mL (1,000 mcg total) into the muscle every 30 (thirty) days., Disp: 1 mL, Rfl: 11   glucose blood (ACCU-CHEK GUIDE) test strip, Check blood sugar 4 times per day, Disp: 125 each, Rfl: 12   insulin  lispro (HUMALOG ) 100 UNIT/ML injection, Inject 0.07 mLs (7 Units total) into the skin 3 (three) times daily with meals., Disp: 10 mL, Rfl: 3   Insulin  Syringe-Needle U-100 31G X 15/64 0.5 ML MISC, 1 Syringe by Does not apply route 3 (three) times daily., Disp: 50 each, Rfl: 3   levalbuterol (XOPENEX HFA) 45 MCG/ACT inhaler, Inhale 2 puffs into the lungs every 4 (four) hours as needed., Disp: , Rfl:    meclizine  (ANTIVERT ) 25 MG tablet, Take 1 tablet (25 mg  total) by mouth 2 (two) times daily as needed for dizziness., Disp: 60 tablet, Rfl: 3   metFORMIN  (GLUCOPHAGE -XR) 500 MG 24 hr tablet, Take 4 tablets (2,000 mg total) by mouth at bedtime., Disp: 360 tablet, Rfl: 3   pregabalin  (LYRICA ) 75 MG capsule, Take 1 capsule (75 mg total) by mouth 2 (two) times daily. (Patient taking differently: Take 150 mg by mouth at bedtime.), Disp: 60 capsule, Rfl: 3   pregabalin  (LYRICA ) 75 MG capsule, Take 75 mg by mouth., Disp: , Rfl:    Semaglutide , 2 MG/DOSE, (OZEMPIC , 2 MG/DOSE,) 8 MG/3ML SOPN, Inject 2 mg into the skin once a week., Disp: 3 mL, Rfl: 3   SEMGLEE , YFGN, 100 UNIT/ML Pen, Inject 30 Units into the skin every evening. (Patient not taking: Reported on 05/27/2024), Disp: , Rfl:    traMADol  (ULTRAM ) 50 MG tablet, Take 50 mg by mouth every 6 (six) hours as needed for severe pain (pain score 7-10)., Disp: , Rfl:    zolpidem  (AMBIEN ) 10 MG tablet, Take 10 mg by mouth at bedtime as needed for sleep., Disp: , Rfl:   Review of Systems: Denies appetite changes, fevers, chills, fatigue, unexplained weight changes. Denies hearing loss, neck lumps or masses, mouth sores, ringing in ears or voice changes. Denies cough or wheezing.  Denies shortness of breath. Denies chest pain or palpitations. Denies leg swelling. Denies abdominal distention, pain, blood in stools, constipation, diarrhea, nausea, vomiting, or early satiety. Denies pain with intercourse, dysuria, frequency, hematuria or incontinence. Denies hot flashes, pelvic pain, vaginal bleeding or vaginal discharge.   Denies joint pain, back pain or muscle pain/cramps. Denies itching, rash, or wounds. Denies dizziness, headaches, numbness or seizures. Denies swollen lymph nodes or glands, denies easy bruising or bleeding. Denies anxiety, depression, confusion, or decreased concentration.  Physical Exam: BP 134/60 (BP Location: Left Arm, Patient Position: Sitting)   Pulse 87   Temp 98.3 F (36.8 C) (Oral)    Resp 20   Wt 262 lb 12.8 oz (119.2 kg)   LMP 06/23/2014   SpO2 100%   BMI 34.67 kg/m  General: Alert, oriented, no acute distress. HEENT: Normocephalic, atraumatic, sclera anicteric. Chest: Clear to auscultation bilaterally.  No wheezes or rhonchi. Cardiovascular: Regular rate and rhythm, no murmurs. Abdomen: Obese, soft, nontender.  Normoactive bowel sounds.  No masses or hepatosplenomegaly appreciated.  Well-healed incisions. Extremities: Grossly normal range of motion.  Warm, well perfused.  No edema bilaterally. Skin: No rashes or lesions noted. Lymphatics: No cervical, supraclavicular, or inguinal adenopathy. GU: Normal appearing external genitalia without erythema, excoriation, or lesions.  Speculum exam reveals mild vaginal atrophy, and very mild changes related to radiation at the vaginal apex.  No lesions noted.  Bimanual exam reveals is intact, no nodularity or masses appreciated.  Rectovaginal exam confirms findings.  Laboratory & Radiologic Studies: None new  Assessment & Plan: [name] is a 63 y.o. 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 Completed adjuvant chemotherapy and VBT in 07/2022. MMR IHC intact, MS stable, p53 wildtype. Imaging in 05/2024 negative for recurrent disease.  Patient is overall doing very well.  She is NED on exam today.  Discussed recent imaging which was negative for recurrent disease.  Per NCCN surveillance recommendations, we discussed surveillance visits every 3 months alternating between my office and radiation oncology, with plan to transition to q 6 months visits within the next year.  Patient will also continue to see Dr. Lonn.  Reviewed signs and symptoms that would be concerning for cancer recurrence, and I stressed the importance of calling if she develops any of these.  20 minutes of total time was spent for this patient encounter, including preparation, face-to-face counseling with the  patient and coordination of care, and documentation of the encounter.  Comer Dollar, MD  Division of Gynecologic Oncology  Department of Obstetrics and Gynecology  [facility]

## 2024-10-01 ENCOUNTER — Inpatient Hospital Stay

## 2024-10-01 ENCOUNTER — Inpatient Hospital Stay: Attending: Gynecologic Oncology

## 2024-10-01 ENCOUNTER — Telehealth: Payer: Self-pay

## 2024-10-01 ENCOUNTER — Encounter: Payer: Self-pay | Admitting: Hematology and Oncology

## 2024-10-01 ENCOUNTER — Inpatient Hospital Stay: Admitting: Hematology and Oncology

## 2024-10-01 VITALS — BP 154/71 | HR 96 | Temp 98.7°F | Resp 18 | Ht 73.0 in | Wt 264.2 lb

## 2024-10-01 DIAGNOSIS — Z8542 Personal history of malignant neoplasm of other parts of uterus: Secondary | ICD-10-CM | POA: Diagnosis present

## 2024-10-01 DIAGNOSIS — E538 Deficiency of other specified B group vitamins: Secondary | ICD-10-CM | POA: Insufficient documentation

## 2024-10-01 DIAGNOSIS — D638 Anemia in other chronic diseases classified elsewhere: Secondary | ICD-10-CM | POA: Diagnosis not present

## 2024-10-01 DIAGNOSIS — C541 Malignant neoplasm of endometrium: Secondary | ICD-10-CM | POA: Diagnosis not present

## 2024-10-01 DIAGNOSIS — D649 Anemia, unspecified: Secondary | ICD-10-CM | POA: Diagnosis not present

## 2024-10-01 DIAGNOSIS — Z08 Encounter for follow-up examination after completed treatment for malignant neoplasm: Secondary | ICD-10-CM | POA: Diagnosis present

## 2024-10-01 LAB — COMPREHENSIVE METABOLIC PANEL WITH GFR
ALT: 22 U/L (ref 0–44)
AST: 21 U/L (ref 15–41)
Albumin: 4.2 g/dL (ref 3.5–5.0)
Alkaline Phosphatase: 89 U/L (ref 38–126)
Anion gap: 5 (ref 5–15)
BUN: 10 mg/dL (ref 8–23)
CO2: 30 mmol/L (ref 22–32)
Calcium: 9.1 mg/dL (ref 8.9–10.3)
Chloride: 109 mmol/L (ref 98–111)
Creatinine, Ser: 0.76 mg/dL (ref 0.44–1.00)
GFR, Estimated: >60 mL/min (ref >60)
Glucose, Bld: 136 mg/dL — ABNORMAL HIGH (ref 70–99)
Potassium: 4 mmol/L (ref 3.5–5.1)
Sodium: 144 mmol/L (ref 135–145)
Total Bilirubin: 0.4 mg/dL (ref 0.0–1.2)
Total Protein: 7.3 g/dL (ref 6.5–8.1)

## 2024-10-01 LAB — CBC WITH DIFFERENTIAL/PLATELET
Abs Immature Granulocytes: 0.01 K/uL (ref 0.00–0.07)
Basophils Absolute: 0 K/uL (ref 0.0–0.1)
Basophils Relative: 1 %
Eosinophils Absolute: 0 K/uL (ref 0.0–0.5)
Eosinophils Relative: 1 %
HCT: 35.1 % — ABNORMAL LOW (ref 36.0–46.0)
Hemoglobin: 11.7 g/dL — ABNORMAL LOW (ref 12.0–15.0)
Immature Granulocytes: 0 %
Lymphocytes Relative: 45 %
Lymphs Abs: 2 K/uL (ref 0.7–4.0)
MCH: 32.3 pg (ref 26.0–34.0)
MCHC: 33.3 g/dL (ref 30.0–36.0)
MCV: 97 fL (ref 80.0–100.0)
Monocytes Absolute: 0.3 K/uL (ref 0.1–1.0)
Monocytes Relative: 6 %
Neutro Abs: 2.2 K/uL (ref 1.7–7.7)
Neutrophils Relative %: 47 %
Platelets: 228 K/uL (ref 150–400)
RBC: 3.62 MIL/uL — ABNORMAL LOW (ref 3.87–5.11)
RDW: 13.2 % (ref 11.5–15.5)
WBC: 4.5 K/uL (ref 4.0–10.5)
nRBC: 0 % (ref 0.0–0.2)

## 2024-10-01 LAB — VITAMIN B12: Vitamin B-12: 408 pg/mL (ref 180–914)

## 2024-10-01 MED ORDER — CYANOCOBALAMIN 1000 MCG/ML IJ SOLN
1000.0000 ug | Freq: Once | INTRAMUSCULAR | Status: AC
  Administered 2024-10-01: 1000 ug via INTRAMUSCULAR
  Filled 2024-10-01: qty 1

## 2024-10-01 NOTE — Telephone Encounter (Signed)
-----   Message from Almarie Bedford sent at 10/01/2024  2:19 PM EST ----- Pls let her know B12 is good Can you add b12 inj to her appt in Jan? After I see her

## 2024-10-01 NOTE — Assessment & Plan Note (Addendum)
 Patient was diagnosed with uterine cancer in March 2023, status post surgery and completion of adjuvant chemotherapy and radiation therapy by April 2023 Final pathology carcinosarcoma Clinically, she has no signs of cancer recurrence I plan to repeat imaging study again next year for further follow-up

## 2024-10-01 NOTE — Progress Notes (Signed)
 [facility] OFFICE PROGRESS NOTE  Patient Care Team: Juliane Che, [place] as PCP - General (Family Medicine)  Assessment & Plan 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[facility]) Patient was diagnosed with uterine cancer in March 2023, status post surgery and completion of adjuvant chemotherapy and radiation therapy by April 2023 Final pathology carcinosarcoma Clinically, she has no signs of cancer recurrence I plan to repeat imaging study again next year for further follow-up Vitamin B12 deficiency She has been getting vitamin B12 injection monthly Repeat vitamin B12 level is adequate After today's injection, I will space it out to every 2 months Anemia, chronic disease This is multifactorial, due to history of B12 deficiency as well as anemia chronic illness She is not symptomatic Observe only Overall, it is improving  Orders Placed This Encounter  Procedures   CT ABDOMEN PELVIS W CONTRAST    Standing Status:   Future    Expected Date:   12/02/2024    Expiration Date:   10/01/2025    Scheduling Instructions:     No oral contrast    If indicated for the ordered procedure, I authorize the administration of contrast media per Radiology protocol:   Yes    Does the patient have a contrast media/X-ray dye allergy?:   No    Preferred imaging location?:   [facility]    If indicated for the ordered procedure, I authorize the administration of oral contrast media per Radiology protocol:   No    Reason for no oral contrast::   No need oral contrast     Almarie Bedford, MD  INTERVAL HISTORY: she returns for surveillance follow-up for history of carcinosarcoma She is doing well She denies abdominal pain, bloating or changes in bowel habits  PHYSICAL EXAMINATION: ECOG PERFORMANCE STATUS: 0 - Asymptomatic  Vitals:   10/01/24 1210  BP: (!) 154/71  Pulse: 96  Resp: 18  Temp: 98.7 F (37.1 C)  SpO2: 97%   Filed Weights   10/01/24 1210  Weight: 264 lb 3.2 oz (119.8 kg)     Relevant data reviewed during this visit included CBC, CMP, vitamin B12 level

## 2024-10-01 NOTE — Assessment & Plan Note (Addendum)
 She has been getting vitamin B12 injection monthly Repeat vitamin B12 level is adequate After today's injection, I will space it out to every 2 months

## 2024-10-01 NOTE — Telephone Encounter (Signed)
 Called and given below message. She verbalized understanding and is aware of next appts.

## 2024-10-01 NOTE — Assessment & Plan Note (Addendum)
 This is multifactorial, due to history of B12 deficiency as well as anemia chronic illness She is not symptomatic Observe only Overall, it is improving

## 2024-12-02 ENCOUNTER — Ambulatory Visit (HOSPITAL_COMMUNITY)
Admission: RE | Admit: 2024-12-02 | Discharge: 2024-12-02 | Disposition: A | Discharge status: Home / Self Care | Source: Ambulatory Visit | Attending: Hematology and Oncology | Admitting: Hematology and Oncology

## 2024-12-02 ENCOUNTER — Inpatient Hospital Stay: Attending: Gynecologic Oncology

## 2024-12-02 DIAGNOSIS — C541 Malignant neoplasm of endometrium: Secondary | ICD-10-CM

## 2024-12-02 LAB — CBC WITH DIFFERENTIAL/PLATELET
Abs Immature Granulocytes: 0 K/uL (ref 0.00–0.07)
Basophils Absolute: 0 K/uL (ref 0.0–0.1)
Basophils Relative: 1 %
Eosinophils Absolute: 0.1 K/uL (ref 0.0–0.5)
Eosinophils Relative: 2 %
HCT: 35.3 % — ABNORMAL LOW (ref 36.0–46.0)
Hemoglobin: 11.6 g/dL — ABNORMAL LOW (ref 12.0–15.0)
Immature Granulocytes: 0 %
Lymphocytes Relative: 46 %
Lymphs Abs: 1.6 K/uL (ref 0.7–4.0)
MCH: 31.5 pg (ref 26.0–34.0)
MCHC: 32.9 g/dL (ref 30.0–36.0)
MCV: 95.9 fL (ref 80.0–100.0)
Monocytes Absolute: 0.2 K/uL (ref 0.1–1.0)
Monocytes Relative: 6 %
Neutro Abs: 1.6 K/uL — ABNORMAL LOW (ref 1.7–7.7)
Neutrophils Relative %: 45 %
Platelets: 209 K/uL (ref 150–400)
RBC: 3.68 MIL/uL — ABNORMAL LOW (ref 3.87–5.11)
RDW: 12.9 % (ref 11.5–15.5)
WBC: 3.5 K/uL — ABNORMAL LOW (ref 4.0–10.5)
nRBC: 0 % (ref 0.0–0.2)

## 2024-12-02 LAB — COMPREHENSIVE METABOLIC PANEL WITH GFR
ALT: 20 U/L (ref 0–44)
AST: 23 U/L (ref 15–41)
Albumin: 4.5 g/dL (ref 3.5–5.0)
Alkaline Phosphatase: 106 U/L (ref 38–126)
Anion gap: 12 (ref 5–15)
BUN: 10 mg/dL (ref 8–23)
CO2: 24 mmol/L (ref 22–32)
Calcium: 9.2 mg/dL (ref 8.9–10.3)
Chloride: 107 mmol/L (ref 98–111)
Creatinine, Ser: 0.69 mg/dL (ref 0.44–1.00)
GFR, Estimated: >60 mL/min
Glucose, Bld: 221 mg/dL — ABNORMAL HIGH (ref 70–99)
Potassium: 4.1 mmol/L (ref 3.5–5.1)
Sodium: 144 mmol/L (ref 135–145)
Total Bilirubin: 0.3 mg/dL (ref 0.0–1.2)
Total Protein: 7.4 g/dL (ref 6.5–8.1)

## 2024-12-02 MED ORDER — IOHEXOL 350 MG/ML SOLN
80.0000 mL | Freq: Once | INTRAVENOUS | Status: AC | PRN
  Administered 2024-12-02: 80 mL via INTRAVENOUS

## 2024-12-07 NOTE — Progress Notes (Signed)
 "  Radiation Oncology         [phone] ________________________________  Name: [name] MRN: [id number]  Date: 12/09/2024  DOB: [birth year]  Follow-Up Visit Note  CC: Juliane Che, PA  Juliane Che, PA  No diagnosis found.  Diagnosis: The encounter diagnosis was 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(HCC).   Carcinosarcoma of the endometrium, Stage II-C by 2023 FIGO staging   Cancer Staging  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[facility]) Staging form: Corpus Uteri - Carcinoma and Carcinosarcoma, AJCC 8th Edition - Pathologic stage from 04/08/2022: FIGO Stage IA (pT1a, pN0, cM0) - Signed by Lonn Hicks, MD on 04/08/2022  Interval Since Last Radiation:  2 years, 4 months, and 29 days    Intent: Curative  Radiation Treatment Dates: 06/13/2022 through 07/11/2022 Site Technique Total Dose (Gy) Dose per Fx (Gy) Completed Fx Beam Energies  Vagina: Pelvis HDR-brachy 30/30 6 5/5 Ir-192    Narrative:  The patient returns today for routine follow-up. She was last seen here for follow-up on 08/11/2022 (she has not been able to keep her last several scheduled appointments).      In the interval since her last visit, she presented for a restaging CT AP w/ contrast on 09/13/22 that demonstrated: a small amount of fluid in the hysterectomy surgical bed measuring 4.2 x 1.9 cm, serosal thickening along the sigmoid, and fascial thickening along the right pelvis, all of which possibly reflecting post-operative changes. Imaging findings also consisted of:  mild expansion of the right ovarian vein with central low attenuation suggestive of ovarian venous thrombus; several hepatic lesions, two of which were definitively present on prior imaging in hepatic subsegment VII and in the lateral segment of the left hepatic lobe (the the lateral segment left hepatic lesion being compatible with a benign hemangioma), and two other possibly new indeterminate lesions.   Dr. Viktoria reviewed these findings with the patient  on 09/20/22 and recommended interval repeat imaging in 3 months. Overall, Dr. Viktoria favored her CT findings to represent post-operative changes rather than recurrent / peritoneal disease. The patient was also noted as NED on examination at the time of the visit. The patient also reviewed these findings with Dr. Lonn on 09/15/22; Dr. Lonn concurred with Dr. Lewie interpretation and recommendations.   Her next restaging CT AP on 12/14/22 showed: improvement in the previously demonstrated fluid in the dependent pelvis with some residual stranding present; an area of slight tissue thickening along the fascial planes of the pelvis, without discrete mass lesion or lymph node enlargement present; stability of the potentially small amount of thrombus in the right gonadal, ovarian vein; evidence of fatty liver infiltration with a segment 2 hemangioma; and stability of one of the small foci of low attenuation in segment 7 of the liver. The second previously demonstrated area of low attenuation in segment 7 of the liver was not demonstrated.   Of note: She was hospitalized from 09/30/23 through 10/06/23 for management of osteomyelitis of the right big toe. Podiatry was consulted during her admission and recommended debridement and bone biopsy. Bone biopsies x2 of the right big toe were collected on 11/04 and confirmed acte osteomyelitis  in 01/02 specimens. ID was also consulted and recommended broad spectrum coverage with vancomycin , ceftriaxone , and metronidazole . She was later switched to ertapenem  to cover for GBS and enterobacter based on cultures. She was discharged home with IV ertapenem  which she completed on 10/30/23 (administered at home via port).   Her next restaging CT AP on 12/05/23 showed no evidence  of recurrent or metastatic disease in the abdomen or pelvis. The previously demonstrated hepatic lesions favoring hemangiomas and possible thrombus of the mid gonadal vein also appeared stable. No  new findings were demonstrated overall. Her next restaging CT AP on 05/28/24 also showed NED.    As for her most recent history, she was seen by Dr. Viktoria for a routine follow-up visit on 09/12/24. She was noted to be doing well and NED on examination at that time. She was also noted as NED during her most recent visit with Dr. Lonn on 10/01/24. Her most recent restaging CT AP on 12/02/24 again showed no evidence of recurrent disease in the abdomen or pelvis.   Other pertinent imaging performed in the interval includes a bilateral screening mammogram on 07/06/24 that showed no evidence of malignancy in either breast.   Of note: She had her port removed on 06/14/24.   Of additional note: She has chronic vitamin B12 deficiency and has been receiving vitamin B12 injections on a routine basis. (She is not able to administer these herself and has been presenting to the cancer center for injections).  ***   Allergies:  is allergic to guaifenesin , nsaids, and penicillins.  Meds: Current Outpatient Medications  Medication Sig Dispense Refill   Accu-Chek Softclix Lancets lancets CHECK BLOOD SUGAR 4 TIMES A DAY BEFORE MEALS AND BEDTIME 204 each 10   acetaminophen  (TYLENOL ) 650 MG CR tablet Take 1,300-1,950 mg by mouth every 8 (eight) hours as needed for pain.     atorvastatin  (LIPITOR ) 80 MG tablet Take 1 tablet (80 mg total) by mouth daily. 90 tablet 3   cyanocobalamin  (VITAMIN B12) 1000 MCG/ML injection Inject 1 mL (1,000 mcg total) into the muscle every 30 (thirty) days. 1 mL 11   glucose blood (ACCU-CHEK GUIDE) test strip Check blood sugar 4 times per day 125 each 12   insulin  lispro (HUMALOG ) 100 UNIT/ML injection Inject 0.07 mLs (7 Units total) into the skin 3 (three) times daily with meals. 10 mL 3   Insulin  Syringe-Needle U-100 31G X 15/64 0.5 ML MISC 1 Syringe by Does not apply route 3 (three) times daily. 50 each 3   levalbuterol (XOPENEX HFA) 45 MCG/ACT inhaler Inhale 2 puffs into the  lungs every 4 (four) hours as needed.     meclizine  (ANTIVERT ) 25 MG tablet Take 1 tablet (25 mg total) by mouth 2 (two) times daily as needed for dizziness. 60 tablet 3   metFORMIN  (GLUCOPHAGE -XR) 500 MG 24 hr tablet Take 4 tablets (2,000 mg total) by mouth at bedtime. 360 tablet 3   pregabalin  (LYRICA ) 75 MG capsule Take 1 capsule (75 mg total) by mouth 2 (two) times daily. (Patient taking differently: Take 150 mg by mouth at bedtime.) 60 capsule 3   pregabalin  (LYRICA ) 75 MG capsule Take 75 mg by mouth.     Semaglutide , 2 MG/DOSE, (OZEMPIC , 2 MG/DOSE,) 8 MG/3ML SOPN Inject 2 mg into the skin once a week. 3 mL 3   SEMGLEE , YFGN, 100 UNIT/ML Pen Inject 30 Units into the skin every evening. (Patient not taking: Reported on 05/27/2024)     zolpidem  (AMBIEN ) 10 MG tablet Take 10 mg by mouth at bedtime as needed for sleep.     No current facility-administered medications for this encounter.    Physical Findings: The patient is in no acute distress. Patient is alert and oriented.  vitals were not taken for this visit. .  No significant changes. Lungs are clear to auscultation bilaterally. Heart  has regular rate and rhythm. No palpable cervical, supraclavicular, or axillary adenopathy. Abdomen soft, non-tender, normal bowel sounds.  On pelvic examination the external genitalia were unremarkable. A speculum exam was performed. There are no mucosal lesions noted in the vaginal vault. A Pap smear was obtained of the proximal vagina. On bimanual and rectovaginal examination there were no pelvic masses appreciated. ***   Lab Findings: Lab Results  Component Value Date   WBC 3.5 (L) 12/02/2024   HGB 11.6 (L) 12/02/2024   HCT 35.3 (L) 12/02/2024   MCV 95.9 12/02/2024   PLT 209 12/02/2024    Radiographic Findings: CT ABDOMEN PELVIS W CONTRAST Result Date: 12/06/2024 EXAM: CT ABDOMEN AND PELVIS WITH CONTRAST 12/02/2024 11:41:25 AM TECHNIQUE: CT of the abdomen and pelvis was performed with the  administration of 80 mL of iohexol  (OMNIPAQUE ) 350 MG/ML injection. Multiplanar reformatted images are provided for review. Automated exposure control, iterative reconstruction, and/or weight-based adjustment of the mA/kV was utilized to reduce the radiation dose to as low as reasonably achievable. COMPARISON: 05/28/2024. CLINICAL HISTORY: Uterine cancer monitor. * Tracking Code: BO *. FINDINGS: LOWER CHEST: No acute abnormality. No atelectasis in the abdomen or pelvis. LIVER: Peripherally enhancing lesion in the left hepatic lobe measures 3.1 cm and is not changed from previous exam. The lesion is peripheral enhancement typical of benign hemangioma. GALLBLADDER AND BILE DUCTS: Gallbladder is unremarkable. No biliary ductal dilatation. SPLEEN: No acute abnormality. PANCREAS: No acute abnormality. ADRENAL GLANDS: No acute abnormality. KIDNEYS, URETERS AND BLADDER: No stones in the kidneys or ureters. No hydronephrosis. No perinephric or periureteral stranding. Urinary bladder is unremarkable. GI AND BOWEL: Stomach demonstrates no acute abnormality. There is no bowel obstruction. PERITONEUM AND RETROPERITONEUM: No ascites. No free air. No peritoneal nodularity or omental nodularity. No evidence of local uterine carcinoma recurrence in the abdomen or pelvis. VASCULATURE: Aorta is normal in caliber. LYMPH NODES: No lymphadenopathy. REPRODUCTIVE ORGANS: Status post hysterectomy. No pelvic sidewall abnormality. No evidence of local uterine carcinoma recurrence. BONES AND SOFT TISSUES: No acute osseous abnormality. No focal soft tissue abnormality. IMPRESSION: 1. No evidence of uterine carcinoma recurrence in the abdomen or pelvis. 2. No evidence of metastatic disease. Electronically signed by: Norleen Boxer MD MD 12/06/2024 02:44 PM EST RP Workstation: [id number]    Impression: Carcinosarcoma of the endometrium, Stage II-C by 2023 FIGO staging  The patient is recovering from the effects of radiation.  ***  Plan:   ***   *** minutes of total time was spent for this patient encounter, including preparation, face-to-face counseling with the patient and coordination of care, physical exam, and documentation of the encounter. ____________________________________  Lynwood CHARM Nasuti, PhD, MD  This document serves as a record of services personally performed by Lynwood Nasuti, MD. It was created on his behalf by Dorthy Fuse, a trained medical scribe. The creation of this record is based on the scribe's personal observations and the provider's statements to them. This document has been checked and approved by the attending provider.   "

## 2024-12-09 ENCOUNTER — Encounter: Payer: Self-pay | Admitting: Hematology and Oncology

## 2024-12-09 ENCOUNTER — Inpatient Hospital Stay

## 2024-12-09 ENCOUNTER — Ambulatory Visit
Admission: RE | Admit: 2024-12-09 | Discharge: 2024-12-09 | Disposition: A | Discharge status: Home / Self Care | Source: Ambulatory Visit | Attending: Radiation Oncology | Admitting: Radiation Oncology

## 2024-12-09 ENCOUNTER — Inpatient Hospital Stay: Admitting: Hematology and Oncology

## 2024-12-09 ENCOUNTER — Encounter: Payer: Self-pay | Admitting: Radiation Oncology

## 2024-12-09 VITALS — BP 122/68 | HR 85 | Temp 97.4°F | Resp 18 | Ht 73.0 in | Wt 261.8 lb

## 2024-12-09 VITALS — BP 143/83 | HR 85 | Temp 97.8°F | Resp 20 | Ht 73.0 in | Wt 259.8 lb

## 2024-12-09 DIAGNOSIS — E538 Deficiency of other specified B group vitamins: Secondary | ICD-10-CM

## 2024-12-09 DIAGNOSIS — C541 Malignant neoplasm of endometrium: Secondary | ICD-10-CM

## 2024-12-09 MED ORDER — CYANOCOBALAMIN 1000 MCG/ML IJ SOLN
1000.0000 ug | Freq: Once | INTRAMUSCULAR | Status: AC
  Administered 2024-12-09: 1000 ug via INTRAMUSCULAR
  Filled 2024-12-09: qty 1

## 2024-12-09 NOTE — Progress Notes (Signed)
 [name] is here today for follow up post radiation to the pelvic.  They completed their radiation on: 07/11/22   Does the patient complain of any of the following:  Pain: No Abdominal bloating: No Diarrhea/Constipation:  Diarrhea that is related to metformin .  Nausea/Vomiting: No Vaginal Discharge: No Blood in Urine or Stool: No Urinary Issues (dysuria/incomplete emptying/ incontinence/ increased frequency/urgency): No  Does patient report using vaginal dilator 2-3 times a week and/or sexually active 2-3 weeks: No Post radiation skin changes: No   Additional comments if applicable:  BP (!) 143/83 (BP Location: Left Arm, Patient Position: Sitting, Cuff Size: Large)   Pulse 85   Temp 97.8 F (36.6 C)   Resp 20   Ht 6' 1 (1.854 m)   Wt 259 lb 12.8 oz (117.8 kg)   LMP 06/23/2014   SpO2 100%   BMI 34.28 kg/m

## 2024-12-09 NOTE — Assessment & Plan Note (Addendum)
 Patient was diagnosed with uterine cancer in March 2023, status post surgery and completion of adjuvant chemotherapy and radiation therapy by April 2023 Final pathology carcinosarcoma Clinically, she has no signs of cancer recurrence I have personally reviewed her CT imaging from January 2026 which showed no evidence of cancer recurrence I plan to repeat imaging study again next year for further follow-up

## 2024-12-09 NOTE — Progress Notes (Signed)
 [name]  Patient Care Team: Juliane Che, [name] as PCP - General (Family Medicine)  Assessment & Plan 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[facility]) Patient was diagnosed with uterine cancer in March 2023, status post surgery and completion of adjuvant chemotherapy and radiation therapy by April 2023 Final pathology carcinosarcoma Clinically, she has no signs of cancer recurrence I have personally reviewed her CT imaging from January 2026 which showed no evidence of cancer recurrence I plan to repeat imaging study again next year for further follow-up Vitamin B12 deficiency She has been getting vitamin B12 injection here Repeat vitamin B12 level is adequate The patient is not able to give herself vitamin B12 injections I will schedule B12 injection here every other month I will recheck vitamin B12 level next year  Orders Placed This Encounter  Procedures   CT ABDOMEN PELVIS W CONTRAST    Standing Status:   Future    Expected Date:   12/01/2025    Expiration Date:   03/01/2026    Scheduling Instructions:     No oral contrast    If indicated for the ordered procedure, I authorize the administration of contrast media per Radiology protocol:   Yes    Does the patient have a contrast media/X-ray dye allergy?:   No    Preferred imaging location?:   [facility]    If indicated for the ordered procedure, I authorize the administration of oral contrast media per Radiology protocol:   No    Reason for no oral contrast::   No need oral contrast   Vitamin B12    Standing Status:   Future    Expiration Date:   12/09/2025     Debra Bedford, MD  INTERVAL HISTORY: she returns for surveillance follow-up for history of uterine carcinosarcoma and chronic vitamin B12 deficiency requiring B12 injections She is doing well overall The patient is still not able to give herself vitamin B12 injections I reviewed blood work and imaging studies with the  patient  PHYSICAL EXAMINATION: ECOG PERFORMANCE STATUS: 0 - Asymptomatic  Vitals:   12/09/24 0957  BP: 122/68  Pulse: 85  Resp: 18  Temp: (!) 97.4 F (36.3 C)  SpO2: 100%   Filed Weights   12/09/24 0957  Weight: 261 lb 12.8 oz (118.8 kg)    Relevant data reviewed during this visit included CBC, CMP, CT imaging

## 2024-12-09 NOTE — Assessment & Plan Note (Addendum)
 She has been getting vitamin B12 injection here Repeat vitamin B12 level is adequate The patient is not able to give herself vitamin B12 injections I will schedule B12 injection here every other month I will recheck vitamin B12 level next year

## 2025-02-06 ENCOUNTER — Inpatient Hospital Stay

## 2025-03-14 ENCOUNTER — Inpatient Hospital Stay: Admitting: Gynecologic Oncology

## 2025-04-08 ENCOUNTER — Inpatient Hospital Stay

## 2025-06-09 ENCOUNTER — Inpatient Hospital Stay

## 2025-06-09 ENCOUNTER — Ambulatory Visit: Admitting: Radiation Oncology

## 2025-08-11 ENCOUNTER — Inpatient Hospital Stay

## 2025-10-10 ENCOUNTER — Inpatient Hospital Stay

## 2025-12-04 ENCOUNTER — Inpatient Hospital Stay

## 2025-12-11 ENCOUNTER — Inpatient Hospital Stay: Admitting: Hematology and Oncology
